# Patient Record
Sex: Female | Born: 1950
Health system: Southern US, Community
[De-identification: ages and names within clinical notes are randomized; demographics above are authoritative.]

## PROBLEM LIST (undated history)

## (undated) DIAGNOSIS — Z5189 Encounter for other specified aftercare: Secondary | ICD-10-CM

## (undated) DIAGNOSIS — I219 Acute myocardial infarction, unspecified: Secondary | ICD-10-CM

## (undated) DIAGNOSIS — Q2112 Patent foramen ovale: Secondary | ICD-10-CM

## (undated) DIAGNOSIS — E785 Hyperlipidemia, unspecified: Secondary | ICD-10-CM

## (undated) DIAGNOSIS — Z8679 Personal history of other diseases of the circulatory system: Secondary | ICD-10-CM

## (undated) DIAGNOSIS — K5792 Diverticulitis of intestine, part unspecified, without perforation or abscess without bleeding: Secondary | ICD-10-CM

## (undated) DIAGNOSIS — M1990 Unspecified osteoarthritis, unspecified site: Secondary | ICD-10-CM

## (undated) DIAGNOSIS — K219 Gastro-esophageal reflux disease without esophagitis: Secondary | ICD-10-CM

## (undated) DIAGNOSIS — I10 Essential (primary) hypertension: Secondary | ICD-10-CM

## (undated) DIAGNOSIS — I639 Cerebral infarction, unspecified: Secondary | ICD-10-CM

## (undated) HISTORY — PX: PARTIAL HYSTERECTOMY: SHX80

## (undated) HISTORY — DX: Hyperlipidemia, unspecified: E78.5

## (undated) HISTORY — PX: TOTAL ABDOMINAL HYSTERECTOMY: SHX209

## (undated) HISTORY — PX: ABDOMINAL HYSTERECTOMY: SHX81

## (undated) HISTORY — DX: Encounter for other specified aftercare: Z51.89

## (undated) HISTORY — DX: Essential (primary) hypertension: I10

## (undated) HISTORY — PX: VENTRAL HERNIA REPAIR: SHX424

## (undated) HISTORY — PX: COLONOSCOPY: SHX174

## (undated) HISTORY — DX: Gastro-esophageal reflux disease without esophagitis: K21.9

## (undated) HISTORY — DX: Unspecified osteoarthritis, unspecified site: M19.90

## (undated) HISTORY — DX: Acute myocardial infarction, unspecified: I21.9

## (undated) HISTORY — DX: Cerebral infarction, unspecified: I63.9

---

## 2000-09-23 ENCOUNTER — Other Ambulatory Visit: Admission: RE | Admit: 2000-09-23 | Discharge: 2000-09-23 | Payer: Self-pay | Admitting: Family Medicine

## 2000-10-01 ENCOUNTER — Encounter: Admission: RE | Admit: 2000-10-01 | Discharge: 2000-10-28 | Payer: Self-pay | Admitting: Family Medicine

## 2001-12-27 ENCOUNTER — Encounter: Admission: RE | Admit: 2001-12-27 | Discharge: 2002-03-27 | Payer: Self-pay | Admitting: Family Medicine

## 2003-05-21 ENCOUNTER — Encounter: Payer: Self-pay | Admitting: Emergency Medicine

## 2003-05-21 ENCOUNTER — Emergency Department (HOSPITAL_COMMUNITY): Admission: EM | Admit: 2003-05-21 | Discharge: 2003-05-21 | Payer: Self-pay | Admitting: Emergency Medicine

## 2004-08-20 ENCOUNTER — Ambulatory Visit: Payer: Self-pay | Admitting: Family Medicine

## 2004-09-17 ENCOUNTER — Ambulatory Visit: Payer: Self-pay | Admitting: Family Medicine

## 2004-12-09 ENCOUNTER — Ambulatory Visit: Payer: Self-pay | Admitting: Family Medicine

## 2005-02-18 ENCOUNTER — Ambulatory Visit: Payer: Self-pay | Admitting: Family Medicine

## 2005-03-02 ENCOUNTER — Ambulatory Visit: Payer: Self-pay | Admitting: Family Medicine

## 2005-03-11 ENCOUNTER — Ambulatory Visit: Payer: Self-pay | Admitting: Family Medicine

## 2005-03-25 ENCOUNTER — Ambulatory Visit: Payer: Self-pay | Admitting: Family Medicine

## 2005-05-25 ENCOUNTER — Ambulatory Visit: Payer: Self-pay | Admitting: Family Medicine

## 2005-05-26 ENCOUNTER — Ambulatory Visit: Payer: Self-pay | Admitting: Family Medicine

## 2005-09-24 ENCOUNTER — Ambulatory Visit: Payer: Self-pay | Admitting: Family Medicine

## 2006-06-23 ENCOUNTER — Ambulatory Visit: Payer: Self-pay | Admitting: Family Medicine

## 2006-08-23 ENCOUNTER — Ambulatory Visit: Payer: Self-pay | Admitting: Family Medicine

## 2006-09-20 ENCOUNTER — Ambulatory Visit: Payer: Self-pay | Admitting: Family Medicine

## 2006-09-24 ENCOUNTER — Ambulatory Visit: Payer: Self-pay | Admitting: Family Medicine

## 2006-12-24 ENCOUNTER — Other Ambulatory Visit: Admission: RE | Admit: 2006-12-24 | Discharge: 2006-12-24 | Payer: Self-pay | Admitting: Family Medicine

## 2006-12-24 ENCOUNTER — Ambulatory Visit: Payer: Self-pay | Admitting: Family Medicine

## 2006-12-24 ENCOUNTER — Encounter: Payer: Self-pay | Admitting: Family Medicine

## 2006-12-24 LAB — CONVERTED CEMR LAB
Albumin: 4.2 g/dL (ref 3.5–5.2)
Alkaline Phosphatase: 87 units/L (ref 39–117)
BUN: 9 mg/dL (ref 6–23)
Basophils Absolute: 0 10*3/uL (ref 0.0–0.1)
Calcium: 9.6 mg/dL (ref 8.4–10.5)
Chloride: 108 meq/L (ref 96–112)
Eosinophils Absolute: 0 10*3/uL (ref 0.0–0.6)
Eosinophils Relative: 0.1 % (ref 0.0–5.0)
Glucose, Bld: 127 mg/dL — ABNORMAL HIGH (ref 70–99)
Hemoglobin: 13.7 g/dL (ref 12.0–15.0)
MCHC: 34 g/dL (ref 30.0–36.0)
Microalb Creat Ratio: 14.1 mg/g (ref 0.0–30.0)
Microalb, Ur: 1.5 mg/dL (ref 0.0–1.9)
Monocytes Relative: 6.1 % (ref 3.0–11.0)
Neutro Abs: 3.2 10*3/uL (ref 1.4–7.7)
Platelets: 170 10*3/uL (ref 150–400)
RBC: 4.46 M/uL (ref 3.87–5.11)
RDW: 13.4 % (ref 11.5–14.6)
TSH: 3.36 microintl units/mL (ref 0.35–5.50)
Total Bilirubin: 0.7 mg/dL (ref 0.3–1.2)

## 2006-12-28 ENCOUNTER — Encounter: Payer: Self-pay | Admitting: Family Medicine

## 2006-12-29 LAB — CONVERTED CEMR LAB: Pap Smear: NORMAL

## 2007-01-06 ENCOUNTER — Ambulatory Visit: Payer: Self-pay | Admitting: Family Medicine

## 2007-02-17 DIAGNOSIS — E782 Mixed hyperlipidemia: Secondary | ICD-10-CM | POA: Insufficient documentation

## 2007-02-17 DIAGNOSIS — M109 Gout, unspecified: Secondary | ICD-10-CM | POA: Insufficient documentation

## 2007-02-17 DIAGNOSIS — E1169 Type 2 diabetes mellitus with other specified complication: Secondary | ICD-10-CM | POA: Insufficient documentation

## 2007-02-17 DIAGNOSIS — E119 Type 2 diabetes mellitus without complications: Secondary | ICD-10-CM | POA: Insufficient documentation

## 2007-02-17 DIAGNOSIS — Z8679 Personal history of other diseases of the circulatory system: Secondary | ICD-10-CM | POA: Insufficient documentation

## 2007-02-17 DIAGNOSIS — I152 Hypertension secondary to endocrine disorders: Secondary | ICD-10-CM | POA: Insufficient documentation

## 2007-02-17 DIAGNOSIS — I10 Essential (primary) hypertension: Secondary | ICD-10-CM | POA: Insufficient documentation

## 2007-02-17 DIAGNOSIS — E1159 Type 2 diabetes mellitus with other circulatory complications: Secondary | ICD-10-CM | POA: Insufficient documentation

## 2008-01-02 ENCOUNTER — Telehealth (INDEPENDENT_AMBULATORY_CARE_PROVIDER_SITE_OTHER): Payer: Self-pay | Admitting: *Deleted

## 2008-01-05 ENCOUNTER — Telehealth (INDEPENDENT_AMBULATORY_CARE_PROVIDER_SITE_OTHER): Payer: Self-pay | Admitting: *Deleted

## 2008-02-22 ENCOUNTER — Telehealth (INDEPENDENT_AMBULATORY_CARE_PROVIDER_SITE_OTHER): Payer: Self-pay | Admitting: *Deleted

## 2008-02-22 ENCOUNTER — Ambulatory Visit: Payer: Self-pay | Admitting: Family Medicine

## 2008-02-24 ENCOUNTER — Encounter (INDEPENDENT_AMBULATORY_CARE_PROVIDER_SITE_OTHER): Payer: Self-pay | Admitting: *Deleted

## 2008-02-24 LAB — CONVERTED CEMR LAB
Albumin: 4.1 g/dL (ref 3.5–5.2)
Basophils Relative: 0.6 % (ref 0.0–1.0)
CO2: 30 meq/L (ref 19–32)
Calcium: 9.6 mg/dL (ref 8.4–10.5)
Creatinine, Ser: 0.8 mg/dL (ref 0.4–1.2)
GFR calc Af Amer: 96 mL/min
GFR calc non Af Amer: 79 mL/min
HCT: 38 % (ref 36.0–46.0)
HDL: 39.3 mg/dL (ref >39.0)
Lymphocytes Relative: 31.9 % (ref 12.0–46.0)
MCV: 92.1 fL (ref 78.0–100.0)
Monocytes Absolute: 0.2 10*3/uL (ref 0.1–1.0)
Neutro Abs: 3.2 10*3/uL (ref 1.4–7.7)
Potassium: 4 meq/L (ref 3.5–5.1)
RBC: 4.13 M/uL (ref 3.87–5.11)
RDW: 14.2 % (ref 11.5–14.6)
TSH: 2.95 microintl units/mL (ref 0.35–5.50)
Total Protein: 7 g/dL (ref 6.0–8.3)
Triglycerides: 145 mg/dL (ref 0–149)
VLDL: 29 mg/dL (ref 0–40)
WBC: 5 10*3/uL (ref 4.5–10.5)

## 2008-04-02 ENCOUNTER — Encounter: Payer: Self-pay | Admitting: Family Medicine

## 2008-07-15 ENCOUNTER — Encounter: Payer: Self-pay | Admitting: Family Medicine

## 2008-07-16 ENCOUNTER — Ambulatory Visit: Payer: Self-pay | Admitting: Family Medicine

## 2008-07-17 ENCOUNTER — Encounter: Payer: Self-pay | Admitting: Family Medicine

## 2008-07-18 ENCOUNTER — Telehealth (INDEPENDENT_AMBULATORY_CARE_PROVIDER_SITE_OTHER): Payer: Self-pay | Admitting: *Deleted

## 2008-07-19 ENCOUNTER — Ambulatory Visit: Payer: Self-pay | Admitting: Family Medicine

## 2008-07-20 ENCOUNTER — Encounter: Payer: Self-pay | Admitting: Family Medicine

## 2008-07-21 LAB — CONVERTED CEMR LAB
ALT: 24 units/L (ref 0–35)
AST: 20 units/L (ref 0–37)
BUN: 18 mg/dL (ref 6–23)
Basophils Absolute: 0 10*3/uL (ref 0.0–0.1)
Basophils Relative: 0.3 % (ref 0.0–3.0)
Cholesterol: 168 mg/dL (ref 0–200)
Eosinophils Absolute: 0 10*3/uL (ref 0.0–0.7)
Eosinophils Relative: 0.1 % (ref 0.0–5.0)
GFR calc non Af Amer: 69 mL/min
HCT: 41.1 % (ref 36.0–46.0)
Hemoglobin: 13.9 g/dL (ref 12.0–15.0)
LDL Cholesterol: 99 mg/dL (ref 0–99)
Lymphocytes Relative: 36 % (ref 12.0–46.0)
Monocytes Absolute: 0.3 10*3/uL (ref 0.1–1.0)
Neutro Abs: 3.1 10*3/uL (ref 1.4–7.7)
Neutrophils Relative %: 57.3 % (ref 43.0–77.0)
Platelets: 157 10*3/uL (ref 150–400)
Potassium: 3.4 meq/L — ABNORMAL LOW (ref 3.5–5.1)
RBC: 4.42 M/uL (ref 3.87–5.11)
Total Bilirubin: 0.8 mg/dL (ref 0.3–1.2)
Uric Acid, Serum: 4.3 mg/dL (ref 2.4–7.0)

## 2008-07-24 ENCOUNTER — Ambulatory Visit: Payer: Self-pay | Admitting: Family Medicine

## 2008-07-24 ENCOUNTER — Telehealth (INDEPENDENT_AMBULATORY_CARE_PROVIDER_SITE_OTHER): Payer: Self-pay | Admitting: *Deleted

## 2008-07-24 ENCOUNTER — Encounter (INDEPENDENT_AMBULATORY_CARE_PROVIDER_SITE_OTHER): Payer: Self-pay | Admitting: *Deleted

## 2008-07-24 LAB — CONVERTED CEMR LAB
OCCULT 1: NEGATIVE
OCCULT 3: NEGATIVE

## 2008-10-03 ENCOUNTER — Telehealth (INDEPENDENT_AMBULATORY_CARE_PROVIDER_SITE_OTHER): Payer: Self-pay | Admitting: *Deleted

## 2008-12-21 ENCOUNTER — Telehealth (INDEPENDENT_AMBULATORY_CARE_PROVIDER_SITE_OTHER): Payer: Self-pay | Admitting: *Deleted

## 2009-01-11 ENCOUNTER — Encounter: Payer: Self-pay | Admitting: Family Medicine

## 2009-04-01 ENCOUNTER — Telehealth (INDEPENDENT_AMBULATORY_CARE_PROVIDER_SITE_OTHER): Payer: Self-pay | Admitting: *Deleted

## 2009-05-02 ENCOUNTER — Telehealth (INDEPENDENT_AMBULATORY_CARE_PROVIDER_SITE_OTHER): Payer: Self-pay | Admitting: *Deleted

## 2009-07-03 ENCOUNTER — Ambulatory Visit: Payer: Self-pay | Admitting: Family Medicine

## 2009-07-16 ENCOUNTER — Encounter: Payer: Self-pay | Admitting: Family Medicine

## 2009-07-16 LAB — CONVERTED CEMR LAB
ALT: 26 units/L (ref 0–35)
AST: 25 units/L (ref 0–37)
Albumin: 4.2 g/dL (ref 3.5–5.2)
Alkaline Phosphatase: 76 units/L (ref 39–117)
Basophils Relative: 0.3 % (ref 0.0–3.0)
Bilirubin, Direct: 0 mg/dL (ref 0.0–0.3)
CO2: 28 meq/L (ref 19–32)
Calcium: 9.7 mg/dL (ref 8.4–10.5)
Chloride: 102 meq/L (ref 96–112)
Cholesterol: 169 mg/dL (ref 0–200)
Creatinine, Ser: 0.8 mg/dL (ref 0.4–1.2)
GFR calc non Af Amer: 78.56 mL/min (ref >60)
Glucose, Bld: 115 mg/dL — ABNORMAL HIGH (ref 70–99)
Hgb A1c MFr Bld: 5.7 % (ref 4.6–6.5)
MCV: 95 fL (ref 78.0–100.0)
Microalb Creat Ratio: 5.8 mg/g (ref 0.0–30.0)
Microalb, Ur: 0.2 mg/dL (ref 0.0–1.9)
Monocytes Absolute: 0.3 10*3/uL (ref 0.1–1.0)
Sodium: 141 meq/L (ref 135–145)
TSH: 3.56 microintl units/mL (ref 0.35–5.50)
Total CHOL/HDL Ratio: 4
Total Protein: 6.9 g/dL (ref 6.0–8.3)
Uric Acid, Serum: 4.2 mg/dL (ref 2.4–7.0)
VLDL: 33 mg/dL (ref 0.0–40.0)
WBC: 4.7 10*3/uL (ref 4.5–10.5)

## 2009-07-26 ENCOUNTER — Encounter: Payer: Self-pay | Admitting: Family Medicine

## 2009-11-18 ENCOUNTER — Telehealth (INDEPENDENT_AMBULATORY_CARE_PROVIDER_SITE_OTHER): Payer: Self-pay | Admitting: *Deleted

## 2009-11-18 ENCOUNTER — Ambulatory Visit: Payer: Self-pay | Admitting: Family Medicine

## 2009-11-28 ENCOUNTER — Encounter: Payer: Self-pay | Admitting: Family Medicine

## 2010-01-13 ENCOUNTER — Encounter: Payer: Self-pay | Admitting: Family Medicine

## 2010-03-20 ENCOUNTER — Encounter: Payer: Self-pay | Admitting: Family Medicine

## 2010-04-02 ENCOUNTER — Encounter (INDEPENDENT_AMBULATORY_CARE_PROVIDER_SITE_OTHER): Payer: Self-pay | Admitting: *Deleted

## 2010-04-11 ENCOUNTER — Ambulatory Visit: Payer: Self-pay | Admitting: Family Medicine

## 2010-04-14 ENCOUNTER — Encounter: Payer: Self-pay | Admitting: Family Medicine

## 2010-07-23 ENCOUNTER — Encounter: Payer: Self-pay | Admitting: Family Medicine

## 2010-08-05 ENCOUNTER — Ambulatory Visit: Payer: Self-pay | Admitting: Family Medicine

## 2010-08-06 LAB — CONVERTED CEMR LAB
BUN: 18 mg/dL (ref 6–23)
Calcium: 9.8 mg/dL (ref 8.4–10.5)
Creatinine, Ser: 0.9 mg/dL (ref 0.4–1.2)
GFR calc non Af Amer: 72 mL/min (ref >60)
Glucose, Bld: 117 mg/dL — ABNORMAL HIGH (ref 70–99)
Uric Acid, Serum: 3.9 mg/dL (ref 2.4–7.0)

## 2010-08-22 ENCOUNTER — Telehealth (INDEPENDENT_AMBULATORY_CARE_PROVIDER_SITE_OTHER): Payer: Self-pay | Admitting: *Deleted

## 2010-08-22 ENCOUNTER — Encounter: Payer: Self-pay | Admitting: Family Medicine

## 2010-09-02 ENCOUNTER — Ambulatory Visit: Payer: Self-pay | Admitting: Family Medicine

## 2010-10-12 LAB — CONVERTED CEMR LAB
ALT: 22 units/L (ref 0–35)
AST: 23 units/L (ref 0–37)
Alkaline Phosphatase: 65 units/L (ref 39–117)
Alkaline Phosphatase: 70 units/L (ref 39–117)
Calcium: 9.2 mg/dL (ref 8.4–10.5)
Chloride: 107 meq/L (ref 96–112)
Creatinine,U: 66.2 mg/dL
GFR calc non Af Amer: 68.49 mL/min (ref >60)
Microalb Creat Ratio: 13.6 mg/g (ref 0.0–30.0)
Total Bilirubin: 0.3 mg/dL (ref 0.3–1.2)
Total Bilirubin: 0.7 mg/dL (ref 0.3–1.2)
Total Protein: 6.6 g/dL (ref 6.0–8.3)

## 2010-10-16 NOTE — Medication Information (Signed)
Summary: Nonadherence with Simcor/[employer] [place]  Nonadherence with [employer] [place]   Imported By: Edmonia James 12/24/2009 11:06:46  _____________________________________________________________________  External Attachment:    Type:   Image     Comment:   External Document

## 2010-10-16 NOTE — Letter (Signed)
Summary: [place]   Imported By: Edmonia James 04/21/2010 11:40:55  _____________________________________________________________________  External Attachment:    Type:   Image     Comment:   External Document

## 2010-10-16 NOTE — Medication Information (Signed)
Summary: Nonadherence with [employer]cor/[employer]BS  Nonadherence with [employer]   Imported By: Edmonia James 02/19/2010 11:03:37  _____________________________________________________________________  External Attachment:    Type:   Image     Comment:   External Document

## 2010-10-16 NOTE — Assessment & Plan Note (Signed)
Summary: CPX,BCBS INS/RH......   Vital Signs:  Patient profile:   60 year old female Height:      65.5 inches Weight:      310 pounds BMI:     50.99 Temp:     97.9 degrees F oral Pulse rate:   63 / minute BP sitting:   120 / 76  (left arm)  Vitals Entered By: Jeremy Johann CMA (August 05, 2010 1:04 PM) CC: refill med, flu shot   History of Present Illness:  Type 1 diabetes mellitus follow-up      This is a 60 year old woman who presents with Type 2 diabetes mellitus follow-up.  The patient denies polyuria, polydipsia, blurred vision, self managed hypoglycemia, hypoglycemia requiring help, weight loss, weight gain, and numbness of extremities.  The patient denies the following symptoms: neuropathic pain, chest pain, vomiting, orthostatic symptoms, poor wound healing, intermittent claudication, vision loss, and foot ulcer.  Since the last visit the patient reports compliance with medications, not exercising regularly, and monitoring blood glucose.  Since the last visit, the patient reports having had eye care by an ophthalmologist and no foot care.    Hyperlipidemia follow-up      The patient also presents for Hyperlipidemia follow-up.  The patient denies muscle aches, GI upset, abdominal pain, flushing, itching, constipation, diarrhea, and fatigue.  The patient denies the following symptoms: chest pain/pressure, exercise intolerance, dypsnea, palpitations, syncope, and pedal edema.  Compliance with medications (by patient report) has been near 100%.  Dietary compliance has been fair.  The patient reports exercising occasionally.  Adjunctive measures currently used by the patient include ASA and niacin.    Problems Prior to Update: 1)  Preventive Health Care  (ICD-V70.0) 2)  Family History Diabetes 1st Degree Relative  (ICD-V18.0) 3)  Obesity  (ICD-278.00) 4)  Rheumatic Fever, Hx of  (ICD-V12.59) 5)  Dm  (ICD-250.00) 6)  Hypertension  (ICD-401.9) 7)  Hyperlipidemia  (ICD-272.4) 8)   Gout  (ICD-274.9)  Medications Prior to Update: 1)  Actoplus Met 15-500 Mg Tabs (Pioglitazone Hcl-Metformin Hcl) .... Take One Tablet Twice Daliy. 2)  Diovan Hct 160-25 Mg Tabs (Valsartan-Hydrochlorothiazide) .... Take One Tablet Daily 3)  Allopurinol 300 Mg Tabs (Allopurinol) .... Take One Tablet Twice Daily 4)  Precision Xtra Blood Glucose   Strp (Glucose Blood) .... Use As Directed 5)  Nexium 40 Mg  Cpdr (Esomeprazole Magnesium) .Marland Kitchen.. 1 By Mouth Once Daily 6)  Klor-Con M20 20 Meq Cr-Tabs (Potassium Chloride Crys Cr) .Marland Kitchen.. 1 By Mouth Two Times A Day 7)  Accu-Chek Instant Plus Test  Strp (Glucose Blood) .... As Directed. 8)  Simcor 1000-40 Mg Xr24h-Tab (Niacin-Simvastatin) .Marland Kitchen.. 1 By Mouth At Bedtime  Current Medications (verified): 1)  Actoplus Met 15-500 Mg Tabs (Pioglitazone Hcl-Metformin Hcl) .... Take One Tablet Twice Daliy. 2)  Diovan Hct 160-25 Mg Tabs (Valsartan-Hydrochlorothiazide) .... Take One Tablet Daily 3)  Allopurinol 300 Mg Tabs (Allopurinol) .... Take One Tablet Twice Daily 4)  Nexium 40 Mg  Cpdr (Esomeprazole Magnesium) .Marland Kitchen.. 1 By Mouth Once Daily 5)  Accu-Chek Instant Plus Test  Strp (Glucose Blood) .... As Directed. 6)  Simcor 1000-40 Mg Xr24h-Tab (Niacin-Simvastatin) .Marland Kitchen.. 1 By Mouth At Bedtime 7)  Aspirin Ec 81 Mg Tbec (Aspirin) .... Take 1 Tab Once Daily 8)  Fish Oil 1000 Mg Caps (Omega-3 Fatty Acids) .... Take 2 Tab Once Daily 9)  K-Vescent 20 Meq Pack (Potassium Chloride) .Marland Kitchen.. 1 By Mouth Once Daily  Allergies (verified): No Known Drug Allergies  Past History:  Family History: Last updated: 07/16/2008 Family History Diabetes 1st degree relative Family History Hypertension Family History CVA Family History High cholesterol  Social History: Last updated: 07/16/2008 Married Alcohol use-no Never Smoked Drug use-no Regular exercise-no  Risk Factors: Caffeine Use: 0 (07/03/2009) Exercise: no (07/03/2009)  Risk Factors: Smoking Status: never  (07/03/2009)  Past medical, surgical, family and social histories (including risk factors) reviewed for relevance to current acute and chronic problems.  Past Medical History: Reviewed history from 07/16/2008 and no changes required. Gout Hyperlipidemia Hypertension Current Problems:  FAMILY HISTORY DIABETES 1ST DEGREE RELATIVE (ICD-V18.0) OBESITY (ICD-278.00) RHEUMATIC FEVER, HX OF (ICD-V12.59) DM (ICD-250.00) HYPERTENSION (ICD-401.9) HYPERLIPIDEMIA (ICD-272.4) GOUT (ICD-274.9)  Past Surgical History: Reviewed history from 07/16/2008 and no changes required. child birth ventral hernia X2 Hysterectomy--  TAH-- secondary to bleeding  Family History: Reviewed history from 07/16/2008 and no changes required. Family History Diabetes 1st degree relative Family History Hypertension Family History CVA Family History High cholesterol  Social History: Reviewed history from 07/16/2008 and no changes required. Married Alcohol use-no Never Smoked Drug use-no Regular exercise-no  Review of Systems      See HPI  Physical Exam  General:  Well-developed,well-nourished,in no acute distress; alert,appropriate and cooperative throughout examination Neck:  No deformities, masses, or tenderness noted. Lungs:  Normal respiratory effort, chest expands symmetrically. Lungs are clear to auscultation, no crackles or wheezes. Heart:  normal rate and no murmur.   Extremities:  No clubbing, cyanosis, edema, or deformity noted with normal full range of motion of all joints.   Skin:  Intact without suspicious lesions or rashes Psych:  Oriented X3 and normally interactive.    Diabetes Management Exam:    Foot Exam (with socks and/or shoes not present):       Sensory-other: vi    Eye Exam:       Eye Exam done elsewhere          Date: 04/30/2010          Results: normal          Done by: [facility]    Impression & Recommendations:  Problem # 1:  PREVENTIVE HEALTH CARE  (ICD-V70.0)  check fasting lab ghm utd   Orders: EKG w/ Interpretation (93000)  Problem # 2:  DM (ICD-250.00)  Her updated medication list for this problem includes:    Actoplus Met 15-500 Mg Tabs (Pioglitazone hcl-metformin hcl) .Marland Kitchen... Take one tablet twice daliy.    Diovan Hct 160-25 Mg Tabs (Valsartan-hydrochlorothiazide) .Marland Kitchen... Take one tablet daily    Aspirin Ec 81 Mg Tbec (Aspirin) .Marland Kitchen... Take 1 tab once daily  Orders: Venipuncture (16109) TLB-BMP (Basic Metabolic Panel-BMET) (80048-METABOL) TLB-Uric Acid, Blood (84550-URIC) T- * Misc. Laboratory test [id number]) Specimen Handling (09811)  Problem # 3:  HYPERTENSION (ICD-401.9)  Her updated medication list for this problem includes:    Diovan Hct 160-25 Mg Tabs (Valsartan-hydrochlorothiazide) .Marland Kitchen... Take one tablet daily  Orders: Venipuncture (91478) TLB-BMP (Basic Metabolic Panel-BMET) (80048-METABOL) TLB-Uric Acid, Blood (84550-URIC) T- * Misc. Laboratory test [id number]) Specimen Handling (13086)  Problem # 4:  HYPERLIPIDEMIA (ICD-272.4)  Her updated medication list for this problem includes:    Simcor 1000-40 Mg Xr24h-tab (Niacin-simvastatin) .Marland Kitchen... 1 by mouth at bedtime  Orders: Venipuncture (57846) TLB-BMP (Basic Metabolic Panel-BMET) (80048-METABOL) TLB-Uric Acid, Blood (84550-URIC) T- * Misc. Laboratory test [phone]) Specimen Handling (28413)  Problem # 5:  GOUT (ICD-274.9)  Her updated medication list for this problem includes:    Allopurinol 300 Mg Tabs (Allopurinol) .Marland Kitchen... Take one  tablet twice daily  Orders: Venipuncture (36644) TLB-BMP (Basic Metabolic Panel-BMET) (80048-METABOL) TLB-Uric Acid, Blood (84550-URIC) T- * Misc. Laboratory test [phone]) Specimen Handling (25956)  Elevate extremity; warm compresses, symptomatic relief and medication as directed.   Complete Medication List: 1)  Actoplus Met 15-500 Mg Tabs (Pioglitazone hcl-metformin hcl) .... Take one tablet twice daliy. 2)  Diovan Hct 160-25  Mg Tabs (Valsartan-hydrochlorothiazide) .... Take one tablet daily 3)  Allopurinol 300 Mg Tabs (Allopurinol) .... Take one tablet twice daily 4)  Nexium 40 Mg Cpdr (Esomeprazole magnesium) .Marland Kitchen.. 1 by mouth once daily 5)  Accu-chek Instant Plus Test Strp (Glucose blood) .... As directed. 6)  Simcor 1000-40 Mg Xr24h-tab (Niacin-simvastatin) .Marland Kitchen.. 1 by mouth at bedtime 7)  Aspirin Ec 81 Mg Tbec (Aspirin) .... Take 1 tab once daily 8)  Fish Oil 1000 Mg Caps (Omega-3 fatty acids) .... Take 2 tab once daily 9)  K-vescent 20 Meq Pack (Potassium chloride) .Marland Kitchen.. 1 by mouth once daily  Other Orders: Admin 1st Vaccine (38756) Flu Vaccine 59yrs + (43329)  Patient Instructions: 1)  Please schedule a follow-up appointment in 1 month.  Prescriptions: DIOVAN HCT 160-25 MG TABS (VALSARTAN-HYDROCHLOROTHIAZIDE) TAKE ONE TABLET DAILY  #90 x 3   Entered and Authorized by:   Loreen Freud DO   Signed by:   Loreen Freud DO on 08/05/2010   Method used:   Print then Give to Patient   RxID:   [id number] SIMCOR 1000-40 MG XR24H-TAB (NIACIN-SIMVASTATIN) 1 by mouth at bedtime  #60 Tablet x 6   Entered and Authorized by:   Loreen Freud DO   Signed by:   Loreen Freud DO on 08/05/2010   Method used:   Electronically to        [id number] (retail)       [address]       [place], [place]  [address]       Ph: [phone] or [phone]       Fax: [phone]   RxID:   [id number] ALLOPURINOL 300 MG TABS (ALLOPURINOL) take one tablet twice daily  #60 Tablet x 0   Entered and Authorized by:   Loreen Freud DO   Signed by:   Loreen Freud DO on 08/05/2010   Method used:   Electronically to        [id number]* (retail)       [address]       [place], [place]  [address]       Ph: [phone] or [phone]       Fax: [phone]   RxID:   [id number] DIOVAN HCT 160-25 MG TABS (VALSARTAN-HYDROCHLOROTHIAZIDE) TAKE ONE TABLET  DAILY  #30 Tablet x 3   Entered and Authorized by:   Loreen Freud DO   Signed by:   Loreen Freud DO on 08/05/2010   Method used:   Electronically to        [id number]* (retail)       [address]       [place], [place]  [address]       Ph: [phone] or [phone]       Fax: [phone]   RxID:   [id number] ACTOPLUS MET 15-500 MG TABS (PIOGLITAZONE HCL-METFORMIN HCL) take one tablet twice daliy.  #60 Tablet x 5  Entered and Authorized by:   Loreen Freud DO   Signed by:   Loreen Freud DO on 08/05/2010   Method used:   Electronically to        [id number]* (retail)       [address]       [place], [place]  [address]       Ph: [phone] or [phone]       Fax: [phone]   RxID:   [id number] K-VESCENT 20 MEQ PACK (POTASSIUM CHLORIDE) 1 by mouth once daily  #30 x 2   Entered and Authorized by:   Loreen Freud DO   Signed by:   Loreen Freud DO on 08/05/2010   Method used:   Electronically to        [id number]* (retail)       [address]       [place], [place]  [address]       Ph: [phone] or [phone]       Fax: [phone]   RxID:   [id number]  Flu Vaccine Consent Questions     Do you have a history of severe allergic reactions to this vaccine? no    Any prior history of allergic reactions to egg and/or gelatin? no    Do you have a sensitivity to the preservative Thimersol? no    Do you have a past history of Guillan-Barre Syndrome? no    Do you currently have an acute febrile illness? no    Have you ever had a severe reaction to latex? no    Vaccine information given and explained to patient? yes    Are you currently pregnant? no    Lot Number:[id number]   Exp Date:03/14/2011   Site Given  Left Deltoid IM   Orders Added: 1)  Admin 1st Vaccine [90471] 2)  Flu Vaccine 50yrs + [90658] 3)  Venipuncture [36415] 4)  TLB-BMP  (Basic Metabolic Panel-BMET) [80048-METABOL] 5)  TLB-Uric Acid, Blood [84550-URIC] 6)  T- * Misc. Laboratory test [99999] 7)  Specimen Handling [99000] 8)  Est. Patient 40-64 years [99396] 9)  EKG w/ Interpretation [93000]  .lbflu

## 2010-10-16 NOTE — Progress Notes (Signed)
  Phone Note Call from Patient   Summary of Call: Needs new strips for her Accu-check machines.     New/Updated Medications: ACCU-CHEK INSTANT PLUS TEST  STRP (GLUCOSE BLOOD) as directed. Prescriptions: ACCU-CHEK INSTANT PLUS TEST  STRP (GLUCOSE BLOOD) as directed.  #60 x 11   Entered by:   Army Fossa CMA   Authorized by:   Loreen Freud DO   Signed by:   Army Fossa CMA on 11/18/2009   Method used:   Electronically to        [id number] (retail)       [address]       [place], [place]  [address]       Ph: [phone] or [phone]       Fax: [phone]   RxID:   [id number]

## 2010-10-16 NOTE — Progress Notes (Signed)
Summary: Prior Auth no override required [employer]  Phone Note Refill Request Call back at [phone] Message from:  Pharmacy on August 22, 2010 7:43 AM  Refills Requested: Medication #1:  NEXIUM 40 MG  CPDR 1 by mouth once daily   Dosage confirmed as above?Dosage Confirmed Prior Auth from [employer] on [address]   Next Appointment Scheduled: 12.20.11 Initial call taken by: Harold Barban,  August 22, 2010 7:44 AM  Follow-up for Phone Call        Prior auth faxed awaiting response..........Marland KitchenFelecia Deloach CMA  August 22, 2010 10:20 AM   Per insurance No override required, pharmacy faxed....Marland KitchenMarland KitchenFelecia Deloach CMA  August 26, 2010 9:01 AM

## 2010-10-16 NOTE — Medication Information (Signed)
Summary: No Override Needed for Nexium/BCBS  No Override Needed for Nexium/BCBS   Imported By: Lanelle Bal 09/04/2010 10:38:22  _____________________________________________________________________  External Attachment:    Type:   Image     Comment:   External Document

## 2010-10-16 NOTE — Letter (Signed)
Summary: Primary Care Appointment Letter  [facility]   [place], [place] [address]   Phone: [phone]  Fax: [phone]    07/23/2010 MRN: [id number]  [facility] [address] [place], [place]  [address]  Dear Ms. Sano,   Your Primary Care Physician Garnet Koyanagi DO has indicated that:    __x_____it is time to schedule an appointment.    _______you missed your appointment on______ and need to call and          reschedule.    _______you need to have lab work done.    _______you need to schedule an appointment discuss lab or test results.    _______you need to call to reschedule your appointment that is                       scheduled on _________.     Please call our office as soon as possible. Our phone number is [phone]. Please press option 1. Our office is open 8a-12noon and 1p-5p, Monday through Friday.     Thank you,    [place]

## 2010-10-16 NOTE — Letter (Signed)
Summary: Primary Care Appointment Letter  [facility]  [address] [place], [place] [address]   Phone: [phone]  Fax: [phone]    04/02/2010 MRN: [id number]  [facility] [address] [place], [place]  [address]  Dear Ms. Leibensperger,   Your Primary Care Physician Loreen Freud DO has indicated that:    _______it is time to schedule an appointment.    _______you missed your appointment on______ and need to call and          reschedule.    __x_____you need to have lab work done.    _______you need to schedule an appointment discuss lab or test results.    _______you need to call to reschedule your appointment that is                       scheduled on _________.     Please call our office as soon as possible. Our phone number is [phone]. Please press option 1. Our office is open 8a-12noon and 1p-5p, Monday through Friday.     Thank you,    [facility] Primary [facility] Scheduler

## 2010-10-16 NOTE — Assessment & Plan Note (Signed)
Summary: review [facility]bs   Vital Signs:  Patient profile:   60 year old female Weight:      310 pounds Pulse rate:   63 / minute Pulse rhythm:   regular BP sitting:   118 / 60  (left arm) Cuff size:   large  Vitals Entered By: Almeta Monas CMA Duncan Dull) (September 02, 2010 1:52 PM) CC: Review [facility]   History of Present Illness: Pt here to review [facility] only.  Current Medications (verified): 1)  Actoplus Met 15-500 Mg Tabs (Pioglitazone Hcl-Metformin Hcl) .... Take One Tablet Twice Daliy. 2)  Diovan Hct 160-25 Mg Tabs (Valsartan-Hydrochlorothiazide) .... Take One Tablet Daily 3)  Allopurinol 300 Mg Tabs (Allopurinol) .... Take One Tablet Twice Daily 4)  Nexium 40 Mg  Cpdr (Esomeprazole Magnesium) .Marland Kitchen.. 1 By Mouth Once Daily 5)  Accu-Chek Instant Plus Test  Strp (Glucose Blood) .... As Directed. 6)  Simcor 1000-40 Mg Xr24h-Tab (Niacin-Simvastatin) .Marland Kitchen.. 1 By Mouth At Bedtime 7)  Aspirin Ec 81 Mg Tbec (Aspirin) .... Take 1 Tab Once Daily 8)  Fish Oil 1000 Mg Caps (Omega-3 Fatty Acids) .... Take 2 Tab Once Daily 9)  K-Vescent 20 Meq Pack (Potassium Chloride) .Marland Kitchen.. 1 By Mouth Once Daily  Allergies (verified): No Known Drug Allergies  Past History:  Past Medical History: Last updated: 07/16/2008 Gout Hyperlipidemia Hypertension Current Problems:  FAMILY HISTORY DIABETES 1ST DEGREE RELATIVE (ICD-V18.0) OBESITY (ICD-278.00) RHEUMATIC FEVER, HX OF (ICD-V12.59) DM (ICD-250.00) HYPERTENSION (ICD-401.9) HYPERLIPIDEMIA (ICD-272.4) GOUT (ICD-274.9)  Past Surgical History: Last updated: 07/16/2008 child birth ventral hernia X2 Hysterectomy--  TAH-- secondary to bleeding  Family History: Last updated: 07/16/2008 Family History Diabetes 1st degree relative Family History Hypertension Family History CVA Family History High cholesterol  Social History: Last updated: 07/16/2008 Married Alcohol use-no Never Smoked Drug use-no Regular  exercise-no  Risk Factors: Caffeine Use: 0 (07/03/2009) Exercise: no (07/03/2009)  Risk Factors: Smoking Status: never (07/03/2009)  Family History: Reviewed history from 07/16/2008 and no changes required. Family History Diabetes 1st degree relative Family History Hypertension Family History CVA Family History High cholesterol  Social History: Reviewed history from 07/16/2008 and no changes required. Married Alcohol use-no Never Smoked Drug use-no Regular exercise-no  Review of Systems      See HPI  Physical Exam  General:  Well-developed,well-nourished,in no acute distress; alert,appropriate and cooperative throughout examination Psych:  Oriented X3 and normally interactive.     Impression & Recommendations:  Problem # 1:  HYPERTENSION (ICD-401.9)  Her updated medication list for this problem includes:    Diovan Hct 160-25 Mg Tabs (Valsartan-hydrochlorothiazide) .Marland Kitchen... Take one tablet daily  BP today: 118/60 Prior BP: 120/76 (08/05/2010)  Labs Reviewed: K+: 4.6 (08/05/2010) Creat: : 0.9 (08/05/2010)   Chol: 169 (07/03/2009)   HDL: 41.50 (07/03/2009)   LDL: 95 (07/03/2009)   TG: 165.0 (07/03/2009)  Problem # 2:  DM (ICD-250.00)  Her updated medication list for this problem includes:    Actoplus Met 15-500 Mg Tabs (Pioglitazone hcl-metformin hcl) .Marland Kitchen... Take one tablet twice daliy.    Diovan Hct 160-25 Mg Tabs (Valsartan-hydrochlorothiazide) .Marland Kitchen... Take one tablet daily    Aspirin Ec 81 Mg Tbec (Aspirin) .Marland Kitchen... Take 1 tab once daily  Labs Reviewed: Creat: 0.9 (08/05/2010)     Last Eye Exam: normal (04/30/2010) Reviewed HgBA1c results: 5.8 (11/18/2009)  5.7 (07/03/2009)  Problem # 3:  HYPERLIPIDEMIA (ICD-272.4)  Her updated medication list for this problem includes:    Simcor 1000-40 Mg Xr24h-tab (Niacin-simvastatin) .Marland KitchenMarland KitchenMarland KitchenMarland Kitchen 1  by mouth at bedtime  Labs Reviewed: SGOT: 23 (04/11/2010)   SGPT: 26 (04/11/2010)   HDL:41.50 (07/03/2009), 40.0 (07/16/2008)   LDL:95 (07/03/2009), 99 (07/16/2008)  Chol:169 (07/03/2009), 168 (07/16/2008)  Trig:165.0 (07/03/2009), 145 (07/16/2008)  Complete Medication List: 1)  Actoplus Met 15-500 Mg Tabs (Pioglitazone hcl-metformin hcl) .... Take one tablet twice daliy. 2)  Diovan Hct 160-25 Mg Tabs (Valsartan-hydrochlorothiazide) .... Take one tablet daily 3)  Allopurinol 300 Mg Tabs (Allopurinol) .... Take one tablet twice daily 4)  Nexium 40 Mg Cpdr (Esomeprazole magnesium) .Marland Kitchen.. 1 by mouth once daily 5)  Accu-chek Instant Plus Test Strp (Glucose blood) .... As directed. 6)  Simcor 1000-40 Mg Xr24h-tab (Niacin-simvastatin) .Marland Kitchen.. 1 by mouth at bedtime 7)  Aspirin Ec 81 Mg Tbec (Aspirin) .... Take 1 tab once daily 8)  Fish Oil 1000 Mg Caps (Omega-3 fatty acids) .... Take 2 tab once daily 9)  K-vescent 20 Meq Pack (Potassium chloride) .Marland Kitchen.. 1 by mouth once daily  Patient Instructions: 1)  fasting labs 6 months---  [facility], bmp  250.00  272.4   Orders Added: 1)  Est. Patient Level III [36644]

## 2010-12-17 ENCOUNTER — Other Ambulatory Visit: Payer: Self-pay | Admitting: Family Medicine

## 2011-02-11 ENCOUNTER — Other Ambulatory Visit: Payer: Self-pay | Admitting: Family Medicine

## 2011-03-04 ENCOUNTER — Other Ambulatory Visit: Payer: Self-pay | Admitting: Family Medicine

## 2011-03-04 DIAGNOSIS — E119 Type 2 diabetes mellitus without complications: Secondary | ICD-10-CM

## 2011-03-05 ENCOUNTER — Other Ambulatory Visit (INDEPENDENT_AMBULATORY_CARE_PROVIDER_SITE_OTHER): Payer: BC Managed Care – PPO

## 2011-03-05 DIAGNOSIS — E119 Type 2 diabetes mellitus without complications: Secondary | ICD-10-CM

## 2011-03-05 LAB — BASIC METABOLIC PANEL
BUN: 14 mg/dL (ref 6–23)
CO2: 30 mEq/L (ref 19–32)
Chloride: 104 mEq/L (ref 96–112)
Potassium: 3.7 mEq/L (ref 3.5–5.1)

## 2011-03-05 NOTE — Progress Notes (Signed)
Labs only

## 2011-03-15 ENCOUNTER — Other Ambulatory Visit: Payer: Self-pay | Admitting: Family Medicine

## 2011-03-25 ENCOUNTER — Telehealth: Payer: Self-pay

## 2011-03-25 NOTE — Telephone Encounter (Signed)
[facility] results--  Comment from Dr.Lowne: Did patient stop Simcor? HDL decreased  And CRP increased recheck Lipid and Hep in 3 months 272.4     Discussed results with patient and she stated she did not stop the Simcor, she is still taking it as directed. I advised continue meds and watch diet and exercise and we will recheck in 3 mos, she voiced understanding and agreed.   Is there anything else that you suggest?   KP

## 2011-03-26 NOTE — Telephone Encounter (Signed)
No --just con't med--it can take the simcor a while to affect the HDL--- over a year sometimes----recheck 3 months----272.4  Lipid hep

## 2011-03-26 NOTE — Telephone Encounter (Signed)
Discussed with patient and she voiced understanding and agreed to follow up in 3 mos    KP

## 2011-03-30 ENCOUNTER — Encounter: Payer: Self-pay | Admitting: Family Medicine

## 2011-05-27 ENCOUNTER — Other Ambulatory Visit: Payer: Self-pay | Admitting: Family Medicine

## 2011-06-17 ENCOUNTER — Other Ambulatory Visit: Payer: Self-pay | Admitting: Family Medicine

## 2011-07-15 ENCOUNTER — Other Ambulatory Visit: Payer: Self-pay | Admitting: Family Medicine

## 2011-07-15 MED ORDER — ALLOPURINOL 300 MG PO TABS: 300.0000 mg | ORAL_TABLET | Freq: Two times a day (BID) | ORAL | Status: DC

## 2011-07-15 NOTE — Telephone Encounter (Signed)
Letter mailed to schedule a CPE with fasting labs     KP

## 2011-07-24 ENCOUNTER — Other Ambulatory Visit: Payer: Self-pay | Admitting: Family Medicine

## 2011-07-27 ENCOUNTER — Encounter: Payer: Self-pay | Admitting: Family Medicine

## 2011-07-28 ENCOUNTER — Encounter: Payer: Self-pay | Admitting: Family Medicine

## 2011-07-28 ENCOUNTER — Ambulatory Visit (INDEPENDENT_AMBULATORY_CARE_PROVIDER_SITE_OTHER): Payer: BC Managed Care – PPO | Admitting: Family Medicine

## 2011-07-28 VITALS — BP 135/70 | HR 88 | Temp 98.1°F | Ht 65.5 in | Wt 336.6 lb

## 2011-07-28 DIAGNOSIS — E785 Hyperlipidemia, unspecified: Secondary | ICD-10-CM

## 2011-07-28 DIAGNOSIS — I10 Essential (primary) hypertension: Secondary | ICD-10-CM

## 2011-07-28 DIAGNOSIS — M109 Gout, unspecified: Secondary | ICD-10-CM

## 2011-07-28 DIAGNOSIS — Z0000 Encounter for general adult medical examination without abnormal findings: Secondary | ICD-10-CM

## 2011-07-28 DIAGNOSIS — K219 Gastro-esophageal reflux disease without esophagitis: Secondary | ICD-10-CM

## 2011-07-28 DIAGNOSIS — E119 Type 2 diabetes mellitus without complications: Secondary | ICD-10-CM

## 2011-07-28 DIAGNOSIS — Z23 Encounter for immunization: Secondary | ICD-10-CM

## 2011-07-28 LAB — BASIC METABOLIC PANEL
CO2: 25 mEq/L (ref 19–32)
Calcium: 9.9 mg/dL (ref 8.4–10.5)
Chloride: 105 mEq/L (ref 96–112)
Creatinine, Ser: 1 mg/dL (ref 0.4–1.2)
Glucose, Bld: 116 mg/dL — ABNORMAL HIGH (ref 70–99)
Sodium: 144 mEq/L (ref 135–145)

## 2011-07-28 LAB — CBC WITH DIFFERENTIAL/PLATELET
Basophils Absolute: 0 10*3/uL (ref 0.0–0.1)
Basophils Relative: 0.5 % (ref 0.0–3.0)
Eosinophils Absolute: 0 10*3/uL (ref 0.0–0.7)
Hemoglobin: 12.8 g/dL (ref 12.0–15.0)
Lymphocytes Relative: 41.2 % (ref 12.0–46.0)
MCHC: 33.8 g/dL (ref 30.0–36.0)
MCV: 95.2 fl (ref 78.0–100.0)
Monocytes Absolute: 0.2 10*3/uL (ref 0.1–1.0)
Neutro Abs: 2.8 10*3/uL (ref 1.4–7.7)
RDW: 15.2 % — ABNORMAL HIGH (ref 11.5–14.6)

## 2011-07-28 LAB — MICROALBUMIN / CREATININE URINE RATIO: Creatinine,U: 38.2 mg/dL

## 2011-07-28 LAB — LIPID PANEL
Cholesterol: 140 mg/dL (ref 0–200)
LDL Cholesterol: 62 mg/dL (ref 0–99)
Total CHOL/HDL Ratio: 3
Triglycerides: 133 mg/dL (ref 0.0–149.0)
VLDL: 26.6 mg/dL (ref 0.0–40.0)

## 2011-07-28 LAB — HEPATIC FUNCTION PANEL
AST: 24 U/L (ref 0–37)
Bilirubin, Direct: 0.1 mg/dL (ref 0.0–0.3)
Total Bilirubin: 0.6 mg/dL (ref 0.3–1.2)

## 2011-07-28 LAB — POCT URINALYSIS DIPSTICK
Bilirubin, UA: NEGATIVE
Blood, UA: NEGATIVE
Glucose, UA: NEGATIVE
pH, UA: 7

## 2011-07-28 LAB — URIC ACID: Uric Acid, Serum: 3.4 mg/dL (ref 2.4–7.0)

## 2011-07-28 LAB — HEMOGLOBIN A1C: Hgb A1c MFr Bld: 5.8 % (ref 4.6–6.5)

## 2011-07-28 MED ORDER — VALSARTAN-HYDROCHLOROTHIAZIDE 160-25 MG PO TABS: 1.0000 | ORAL_TABLET | Freq: Every day | ORAL | Status: DC

## 2011-07-28 MED ORDER — SAXAGLIPTIN-METFORMIN ER 5-1000 MG PO TB24: ORAL_TABLET | ORAL | Status: DC

## 2011-07-28 MED ORDER — NIACIN-SIMVASTATIN ER 1000-40 MG PO TB24: ORAL_TABLET | ORAL | Status: DC

## 2011-07-28 MED ORDER — ALLOPURINOL 300 MG PO TABS: 300.0000 mg | ORAL_TABLET | Freq: Two times a day (BID) | ORAL | Status: DC

## 2011-07-28 MED ORDER — ESOMEPRAZOLE MAGNESIUM 40 MG PO CPDR: DELAYED_RELEASE_CAPSULE | ORAL | Status: DC

## 2011-07-28 NOTE — Progress Notes (Signed)
Subjective:     Rachel Black is a 60 y.o. female and is here for a comprehensive physical exam. The patient reports problems - knees are bone on bone.  No surgery because she needs to lose weight first.  .  History   Social History  . Marital Status: Married    Spouse Name: N/A    Number of Children: N/A  . Years of Education: N/A   Occupational History  . Not on file.   Social History Main Topics  . Smoking status: Never Smoker   . Smokeless tobacco: Not on file  . Alcohol Use: No  . Drug Use: No  . Sexually Active:    Other Topics Concern  . Not on file   Social History Narrative  . No narrative on file   Health Maintenance  Topic Date Due  . Pap Smear  06/13/1970  . Mammogram  06/13/2002  . Colonoscopy  06/13/2002  . Influenza Vaccine  06/15/2011  . Tetanus/tdap  10/07/2011    The following portions of the patient's history were reviewed and updated as appropriate: allergies, current medications, past family history, past medical history, past social history, past surgical history and problem list.  Review of Systems Review of Systems  Constitutional: Negative for activity change, appetite change and fatigue.  HENT: Negative for hearing loss, congestion, tinnitus and ear discharge.  dentist q61mEyes: Negative for visual disturbance (see optho q1y -- vision corrected to 20/20 with glasses).  Respiratory: Negative for cough, chest tightness and shortness of breath.   Cardiovascular: Negative for chest pain, palpitations and leg swelling.  Gastrointestinal: Negative for abdominal pain, diarrhea, constipation and abdominal distention.  Genitourinary: Negative for urgency, frequency, decreased urine volume and difficulty urinating.  Musculoskeletal: Negative for back pain,  gait problem. + knee pain Skin: Negative for color change, pallor and rash.  Neurological: Negative for dizziness, light-headedness, numbness and headaches.  Hematological: Negative for adenopathy.  Does not bruise/bleed easily.  Psychiatric/Behavioral: Negative for suicidal ideas, confusion, sleep disturbance, self-injury, dysphoric mood, decreased concentration and agitation.       Objective:    BP 135/70  Pulse 88  Temp(Src) 98.1 F (36.7 C) (Oral)  Ht 5' 5.5" (1.664 m)  Wt 336 lb 9.6 oz (152.681 kg)  BMI 55.16 kg/m2 General appearance: alert, cooperative, appears stated age, no distress and morbidly obese Head: Normocephalic, without obvious abnormality, atraumatic Eyes: conjunctivae/corneas clear. PERRL, EOM's intact. Fundi benign. Ears: normal TM's and external ear canals both ears Nose: Nares normal. Septum midline. Mucosa normal. No drainage or sinus tenderness. Throat: lips, mucosa, and tongue normal; teeth and gums normal Neck: no adenopathy, no carotid bruit, no JVD, supple, symmetrical, trachea midline and thyroid not enlarged, symmetric, no tenderness/mass/nodules Back: symmetric, no curvature. ROM normal. No CVA tenderness. Lungs: clear to auscultation bilaterally Breasts: normal appearance, no masses or tenderness Heart: regular rate and rhythm, S1, S2 normal, no murmur, click, rub or gallop Abdomen: soft, non-tender; bowel sounds normal; no masses,  no organomegaly Pelvic: not indicated; status post hysterectomy, negative ROS Extremities: extremities normal, atraumatic, no cyanosis or edema Pulses: 2+ and symmetric Skin: Skin color, texture, turgor normal. No rashes or lesions Lymph nodes: Cervical, supraclavicular, and axillary nodes normal. Neurologic: Alert and oriented X 3, normal strength and tone. Normal symmetric reflexes. Normal coordination and gait psych--  no depression/ anxiety    Assessment:    Healthy female exam.  DMII---stop actos plus met---start kombiglyza htn--stable, con't meds Hyperlipidemia-- con't meds OA-- per ortho  Plan:    ghm utd Check fasting labs See After Visit Summary for Counseling Recommendations

## 2011-07-28 NOTE — Patient Instructions (Signed)

## 2012-01-21 ENCOUNTER — Other Ambulatory Visit: Payer: Self-pay | Admitting: Family Medicine

## 2012-02-09 ENCOUNTER — Other Ambulatory Visit: Payer: Self-pay | Admitting: Family Medicine

## 2012-02-19 ENCOUNTER — Other Ambulatory Visit: Payer: Self-pay | Admitting: Family Medicine

## 2012-02-22 NOTE — Telephone Encounter (Signed)
Letter mailed to schedule labs.      KP

## 2012-02-29 ENCOUNTER — Ambulatory Visit (INDEPENDENT_AMBULATORY_CARE_PROVIDER_SITE_OTHER): Payer: BC Managed Care – PPO | Admitting: Family Medicine

## 2012-02-29 ENCOUNTER — Encounter: Payer: Self-pay | Admitting: Family Medicine

## 2012-02-29 VITALS — BP 130/72 | HR 104 | Temp 98.4°F | Wt 310.3 lb

## 2012-02-29 DIAGNOSIS — E785 Hyperlipidemia, unspecified: Secondary | ICD-10-CM

## 2012-02-29 DIAGNOSIS — M109 Gout, unspecified: Secondary | ICD-10-CM

## 2012-02-29 DIAGNOSIS — I10 Essential (primary) hypertension: Secondary | ICD-10-CM

## 2012-02-29 DIAGNOSIS — N390 Urinary tract infection, site not specified: Secondary | ICD-10-CM

## 2012-02-29 DIAGNOSIS — K219 Gastro-esophageal reflux disease without esophagitis: Secondary | ICD-10-CM

## 2012-02-29 DIAGNOSIS — E119 Type 2 diabetes mellitus without complications: Secondary | ICD-10-CM

## 2012-02-29 DIAGNOSIS — E876 Hypokalemia: Secondary | ICD-10-CM

## 2012-02-29 LAB — MICROALBUMIN / CREATININE URINE RATIO: Microalb, Ur: 1.2 mg/dL (ref 0.0–1.9)

## 2012-02-29 LAB — H. PYLORI ANTIBODY, IGG: H Pylori IgG: NEGATIVE

## 2012-02-29 LAB — POCT URINALYSIS DIPSTICK
Glucose, UA: NEGATIVE
Ketones, UA: NEGATIVE
Protein, UA: NEGATIVE
Spec Grav, UA: 1.015

## 2012-02-29 LAB — HEPATIC FUNCTION PANEL
Albumin: 4.1 g/dL (ref 3.5–5.2)
Alkaline Phosphatase: 73 U/L (ref 39–117)
Bilirubin, Direct: 0.1 mg/dL (ref 0.0–0.3)
Total Bilirubin: 0.7 mg/dL (ref 0.3–1.2)

## 2012-02-29 LAB — LIPID PANEL
HDL: 46.8 mg/dL (ref >39.00)
LDL Cholesterol: 45 mg/dL (ref 0–99)
VLDL: 28 mg/dL (ref 0.0–40.0)

## 2012-02-29 LAB — BASIC METABOLIC PANEL
BUN: 20 mg/dL (ref 6–23)
Calcium: 9.7 mg/dL (ref 8.4–10.5)
Creatinine, Ser: 0.9 mg/dL (ref 0.4–1.2)
GFR: 64.62 mL/min (ref >60.00)

## 2012-02-29 LAB — CBC WITH DIFFERENTIAL/PLATELET
Eosinophils Relative: 0 % (ref 0.0–5.0)
Lymphocytes Relative: 31.6 % (ref 12.0–46.0)
Monocytes Absolute: 0.4 10*3/uL (ref 0.1–1.0)
Monocytes Relative: 7.2 % (ref 3.0–12.0)
Neutrophils Relative %: 60.8 % (ref 43.0–77.0)
Platelets: 151 10*3/uL (ref 150.0–400.0)
RBC: 4.2 Mil/uL (ref 3.87–5.11)
WBC: 6.1 10*3/uL (ref 4.5–10.5)

## 2012-02-29 LAB — HEMOGLOBIN A1C: Hgb A1c MFr Bld: 5.8 % (ref 4.6–6.5)

## 2012-02-29 LAB — URIC ACID: Uric Acid, Serum: 3.1 mg/dL (ref 2.4–7.0)

## 2012-02-29 MED ORDER — ALLOPURINOL 300 MG PO TABS: 300.0000 mg | ORAL_TABLET | Freq: Two times a day (BID) | ORAL | Status: DC

## 2012-02-29 MED ORDER — GLUCOSE BLOOD VI STRP: ORAL_STRIP | Status: DC

## 2012-02-29 MED ORDER — DEXLANSOPRAZOLE 60 MG PO CPDR: 60.0000 mg | DELAYED_RELEASE_CAPSULE | Freq: Every day | ORAL | Status: DC

## 2012-02-29 MED ORDER — NIACIN-SIMVASTATIN ER 1000-40 MG PO TB24: ORAL_TABLET | ORAL | Status: DC

## 2012-02-29 MED ORDER — POTASSIUM CHLORIDE 20 MEQ PO PACK: 20.0000 meq | PACK | Freq: Every day | ORAL | Status: DC

## 2012-02-29 MED ORDER — VALSARTAN-HYDROCHLOROTHIAZIDE 160-25 MG PO TABS: 1.0000 | ORAL_TABLET | Freq: Every day | ORAL | Status: DC

## 2012-02-29 MED ORDER — SAXAGLIPTIN-METFORMIN ER 5-1000 MG PO TB24: ORAL_TABLET | ORAL | Status: DC

## 2012-02-29 NOTE — Assessment & Plan Note (Signed)
>>  ASSESSMENT AND PLAN FOR MIXED HYPERLIPIDEMIA WRITTEN ON 02/29/2012 11:48 AM BY LOWNE, YVONNE R, DO  Check labs con't meds

## 2012-02-29 NOTE — Assessment & Plan Note (Signed)
Stable con't meds 

## 2012-02-29 NOTE — Assessment & Plan Note (Signed)
Check labs con't meds 

## 2012-02-29 NOTE — Patient Instructions (Signed)
Diet for GERD or PUD Nutrition therapy can help ease the discomfort of gastroesophageal reflux disease (GERD) and peptic ulcer disease (PUD).  HOME CARE INSTRUCTIONS   Eat your meals slowly, in a relaxed setting.   Eat 5 to 6 small meals per day.   If a food causes distress, stop eating it for a period of time.  FOODS TO AVOID  Coffee, regular or decaffeinated.   Cola beverages, regular or low calorie.   Tea, regular or decaffeinated.   Pepper.   Cocoa.   High fat foods, including meats.   Butter, margarine, hydrogenated oil (trans fats).   Peppermint or spearmint (if you have GERD).   Fruits and vegetables if not tolerated.   Alcohol.   Nicotine (smoking or chewing). This is one of the most potent stimulants to acid production in the gastrointestinal tract.   Any food that seems to aggravate your condition.  If you have questions regarding your diet, ask your caregiver or a registered dietitian. TIPS  Lying flat may make symptoms worse. Keep the head of your bed raised 6 to 9 inches (15 to 23 cm) by using a foam wedge or blocks under the legs of the bed.   Do not lay down until 3 hours after eating a meal.   Daily physical activity may help reduce symptoms.  MAKE SURE YOU:   Understand these instructions.   Will watch your condition.   Will get help right away if you are not doing well or get worse.  Document Released: 08/31/2005 Document Revised: 08/20/2011 Document Reviewed: 07/17/2011 Exit[employer] Patient Information 2012 [employer]. 

## 2012-02-29 NOTE — Assessment & Plan Note (Signed)
>>  ASSESSMENT AND PLAN FOR ESSENTIAL HYPERTENSION WRITTEN ON 02/29/2012 11:49 AM BY LOWNE, YVONNE R, DO  Stable con't meds

## 2012-02-29 NOTE — Progress Notes (Signed)
  Subjective:    Patient ID: Rachel Black, female    DOB: February 22, 1951, 61 y.o.   MRN: [id number]  HPI HYPERTENSION Disease Monitoring Blood pressure range-not checking Chest pain- no      Dyspnea- no Medications Compliance- good Lightheadedness- no   Edema- some   DIABETES Disease Monitoring Blood Sugar ranges-120-125 Polyuria- no New Visual problems- no Medications Compliance- good Hypoglycemic symptoms- no   HYPERLIPIDEMIA Disease Monitoring See symptoms for Hypertension Medications Compliance- good RUQ pain- no  Muscle aches- no  ROS See HPI above   PMH Smoking Status noted    Review of Systems As above    Objective:   Physical Exam  Constitutional: She is oriented to person, place, and time. She appears well-developed and well-nourished.  Cardiovascular: Normal rate and regular rhythm.   No murmur heard. Pulmonary/Chest: Effort normal and breath sounds normal. No respiratory distress. She has no wheezes. She has no rales.  Musculoskeletal: She exhibits edema. She exhibits no tenderness.       mononucleosis  Neurological: She is alert and oriented to person, place, and time.  Psychiatric: She has a normal mood and affect. Her behavior is normal. Judgment and thought content normal.          Assessment & Plan:

## 2012-03-02 LAB — URINE CULTURE: Colony Count: 75000

## 2012-03-10 ENCOUNTER — Ambulatory Visit (INDEPENDENT_AMBULATORY_CARE_PROVIDER_SITE_OTHER): Payer: BC Managed Care – PPO | Admitting: Family Medicine

## 2012-03-10 ENCOUNTER — Encounter: Payer: Self-pay | Admitting: Family Medicine

## 2012-03-10 ENCOUNTER — Telehealth: Payer: Self-pay | Admitting: Family Medicine

## 2012-03-10 VITALS — BP 134/72 | HR 107 | Temp 98.5°F | Wt 302.0 lb

## 2012-03-10 DIAGNOSIS — R232 Flushing: Secondary | ICD-10-CM | POA: Insufficient documentation

## 2012-03-10 DIAGNOSIS — R209 Unspecified disturbances of skin sensation: Secondary | ICD-10-CM

## 2012-03-10 DIAGNOSIS — R202 Paresthesia of skin: Secondary | ICD-10-CM

## 2012-03-10 NOTE — Telephone Encounter (Signed)
FYI

## 2012-03-10 NOTE — Telephone Encounter (Signed)
Caller: Rachel Black/Patient; PCP: Rosalita Chessman.; CB#: [phone]; Call regarding Woke Feeling Flushed/Hot and Skin Was Burning/Prickling; no rash; Onset 03/10/12 X 10 min.  Afebrile.  FBS 118.  Felt nausea after sensation passed.  Began Dexilant 03/01/12. Advised to see MD within 24 hrs for symptoms began after starting RX per Numbness or Tingling Guideline.  Appt scheduled for 1330 03/10/12 with Dr Etter Sjogren.

## 2012-03-10 NOTE — Patient Instructions (Addendum)
If symptoms return -- call If worsen go to ER We may need to consider changing the simcor

## 2012-03-10 NOTE — Progress Notes (Signed)
  Subjective:    Patient ID: Rachel Black, female    DOB: July 01, 1951, 61 y.o.   MRN: [id number]  HPI Pt woke up with tingling and burning all over.  It lasted just 10-20 minutes and went away.   She took simcor the same time last night as usual.   Symptoms are now gone and she feels fine.      Review of Systems As above    Objective:   Physical Exam  Constitutional: She is oriented to person, place, and time. She appears well-developed and well-nourished.  Neurological: She is alert and oriented to person, place, and time.  Skin: Skin is warm and dry. No rash noted. No erythema.  Psychiatric: She has a normal mood and affect. Her behavior is normal. Judgment and thought content normal.          Assessment & Plan:

## 2012-03-10 NOTE — Assessment & Plan Note (Signed)
?   If from simcor If occurs again we will consider holding simcor  rto prn

## 2012-08-02 ENCOUNTER — Other Ambulatory Visit: Payer: Self-pay | Admitting: Family Medicine

## 2012-08-02 NOTE — Telephone Encounter (Signed)
Rx sent.    MW 

## 2012-09-09 ENCOUNTER — Telehealth: Payer: Self-pay | Admitting: Family Medicine

## 2012-09-09 DIAGNOSIS — K219 Gastro-esophageal reflux disease without esophagitis: Secondary | ICD-10-CM

## 2012-09-09 MED ORDER — DEXLANSOPRAZOLE 60 MG PO CPDR: 60.0000 mg | DELAYED_RELEASE_CAPSULE | Freq: Every day | ORAL | Status: DC

## 2012-09-09 NOTE — Telephone Encounter (Signed)
Dexilant DR 60 MG capsule Qty: 30 Last fill:11.24.13 Take 1 capsule by mouth once daily

## 2012-09-09 NOTE — Telephone Encounter (Signed)
Refill for dexilant sent to pharmacy

## 2012-09-12 ENCOUNTER — Other Ambulatory Visit: Payer: Self-pay | Admitting: Family Medicine

## 2012-09-12 MED ORDER — DEXLANSOPRAZOLE 60 MG PO CPDR: 60.0000 mg | DELAYED_RELEASE_CAPSULE | Freq: Every day | ORAL | Status: DC

## 2012-09-12 NOTE — Addendum Note (Signed)
Addended by: Arnette Norris on: 09/12/2012 02:22 PM   Modules accepted: Orders

## 2012-09-28 ENCOUNTER — Other Ambulatory Visit: Payer: Self-pay | Admitting: Family Medicine

## 2012-10-04 ENCOUNTER — Telehealth: Payer: Self-pay | Admitting: *Deleted

## 2012-10-04 MED ORDER — SITAGLIP PHOS-METFORMIN HCL ER 100-1000 MG PO TB24: 1.0000 | ORAL_TABLET | Freq: Every day | ORAL | Status: DC

## 2012-10-04 NOTE — Telephone Encounter (Signed)
No PA available Pt must try Janumet or Jentadueto.Please advise

## 2012-10-04 NOTE — Telephone Encounter (Signed)
Tried calling the patient but the line was busy-- Rx faxed      KP

## 2012-10-04 NOTE — Telephone Encounter (Signed)
janumet 100/1000 1 po qd  #30  2refills

## 2012-10-05 NOTE — Telephone Encounter (Signed)
Patient made aware Rx has been faxed     KP

## 2012-10-29 ENCOUNTER — Other Ambulatory Visit: Payer: Self-pay | Admitting: Family Medicine

## 2012-12-02 ENCOUNTER — Ambulatory Visit (INDEPENDENT_AMBULATORY_CARE_PROVIDER_SITE_OTHER): Payer: Managed Care, Other (non HMO) | Admitting: Family Medicine

## 2012-12-02 ENCOUNTER — Encounter: Payer: Self-pay | Admitting: *Deleted

## 2012-12-02 ENCOUNTER — Encounter: Payer: Self-pay | Admitting: Family Medicine

## 2012-12-02 VITALS — BP 110/76 | HR 104 | Temp 98.1°F | Wt 293.0 lb

## 2012-12-02 DIAGNOSIS — I10 Essential (primary) hypertension: Secondary | ICD-10-CM

## 2012-12-02 DIAGNOSIS — E119 Type 2 diabetes mellitus without complications: Secondary | ICD-10-CM

## 2012-12-02 DIAGNOSIS — K219 Gastro-esophageal reflux disease without esophagitis: Secondary | ICD-10-CM

## 2012-12-02 DIAGNOSIS — M109 Gout, unspecified: Secondary | ICD-10-CM

## 2012-12-02 DIAGNOSIS — E876 Hypokalemia: Secondary | ICD-10-CM

## 2012-12-02 DIAGNOSIS — E785 Hyperlipidemia, unspecified: Secondary | ICD-10-CM

## 2012-12-02 MED ORDER — ALLOPURINOL 300 MG PO TABS: 300.0000 mg | ORAL_TABLET | Freq: Two times a day (BID) | ORAL | Status: DC

## 2012-12-02 MED ORDER — VALSARTAN-HYDROCHLOROTHIAZIDE 160-25 MG PO TABS: 1.0000 | ORAL_TABLET | Freq: Every day | ORAL | Status: DC

## 2012-12-02 MED ORDER — GLUCOSE BLOOD VI STRP: ORAL_STRIP | Status: DC

## 2012-12-02 MED ORDER — NIACIN-SIMVASTATIN ER 1000-40 MG PO TB24: ORAL_TABLET | ORAL | Status: DC

## 2012-12-02 MED ORDER — DEXLANSOPRAZOLE 60 MG PO CPDR: 60.0000 mg | DELAYED_RELEASE_CAPSULE | Freq: Every day | ORAL | Status: DC

## 2012-12-02 MED ORDER — POTASSIUM CHLORIDE 20 MEQ PO PACK: 20.0000 meq | PACK | Freq: Every day | ORAL | Status: DC

## 2012-12-02 MED ORDER — SITAGLIP PHOS-METFORMIN HCL ER 100-1000 MG PO TB24: 1.0000 | ORAL_TABLET | Freq: Every day | ORAL | Status: DC

## 2012-12-02 NOTE — Patient Instructions (Addendum)
Diabetes Meal Planning Guide The diabetes meal planning guide is a tool to help you plan your meals and snacks. It is important for people with diabetes to manage their blood glucose (sugar) levels. Choosing the right foods and the right amounts throughout your day will help control your blood glucose. Eating right can even help you improve your blood pressure and reach or maintain a healthy weight. CARBOHYDRATE COUNTING MADE EASY When you eat carbohydrates, they turn to sugar. This raises your blood glucose level. Counting carbohydrates can help you control this level so you feel better. When you plan your meals by counting carbohydrates, you can have more flexibility in what you eat and balance your medicine with your food intake. Carbohydrate counting simply means adding up the total amount of carbohydrate grams in your meals and snacks. Try to eat about the same amount at each meal. Foods with carbohydrates are listed below. Each portion below is 1 carbohydrate serving or 15 grams of carbohydrates. Ask your dietician how many grams of carbohydrates you should eat at each meal or snack. Grains and Starches  1 slice bread.   English muffin or hotdog/hamburger bun.   cup cold cereal (unsweetened).   cup cooked pasta or rice.   cup starchy vegetables (corn, potatoes, peas, beans, winter squash).  1 tortilla (6 inches).   bagel.  1 waffle or pancake (size of a CD).   cup cooked cereal.  4 to 6 small crackers. *Whole grain is recommended. Fruit  1 cup fresh unsweetened berries, melon, papaya, pineapple.  1 small fresh fruit.   banana or mango.   cup fruit juice (4 oz unsweetened).   cup canned fruit in natural juice or water.  2 tbs dried fruit.  12 to 15 grapes or cherries. Milk and Yogurt  1 cup fat-free or 1% milk.  1 cup soy milk.  6 oz light yogurt with sugar-free sweetener.  6 oz low-fat soy yogurt.  6 oz plain yogurt. Vegetables  1 cup raw or  cup  cooked is counted as 0 carbohydrates or a "free" food.  If you eat 3 or more servings at 1 meal, count them as 1 carbohydrate serving. Other Carbohydrates   oz chips or pretzels.   cup ice cream or frozen yogurt.   cup sherbet or sorbet.  2 inch square cake, no frosting.  1 tbs honey, sugar, jam, jelly, or syrup.  2 small cookies.  3 squares of graham crackers.  3 cups popcorn.  6 crackers.  1 cup broth-based soup.  Count 1 cup casserole or other mixed foods as 2 carbohydrate servings.  Foods with less than 20 calories in a serving may be counted as 0 carbohydrates or a "free" food. You may want to purchase a book or computer software that lists the carbohydrate gram counts of different foods. In addition, the nutrition facts panel on the labels of the foods you eat are a good source of this information. The label will tell you how big the serving size is and the total number of carbohydrate grams you will be eating per serving. Divide this number by 15 to obtain the number of carbohydrate servings in a portion. Remember, 1 carbohydrate serving equals 15 grams of carbohydrate. SERVING SIZES Measuring foods and serving sizes helps you make sure you are getting the right amount of food. The list below tells how big or small some common serving sizes are.  1 oz.........4 stacked dice.  3 oz........Marland KitchenDeck of cards.  1 tsp.......Marland KitchenTip  of little finger.  1 tbs......Marland KitchenMarland KitchenThumb.  2 tbs.......Marland KitchenGolf ball.   cup......Marland KitchenHalf of a fist.  1 cup.......Marland KitchenA fist. SAMPLE DIABETES MEAL PLAN Below is a sample meal plan that includes foods from the grain and starches, dairy, vegetable, fruit, and meat groups. A dietician can individualize a meal plan to fit your calorie needs and tell you the number of servings needed from each food group. However, controlling the total amount of carbohydrates in your meal or snack is more important than making sure you include all of the food groups at every  meal. You may interchange carbohydrate containing foods (dairy, starches, and fruits). The meal plan below is an example of a 2000 calorie diet using carbohydrate counting. This meal plan has 17 carbohydrate servings. Breakfast  1 cup oatmeal (2 carb servings).   cup light yogurt (1 carb serving).  1 cup blueberries (1 carb serving).   cup almonds. Snack  1 large apple (2 carb servings).  1 low-fat string cheese stick. Lunch  Chicken breast salad.  1 cup spinach.   cup chopped tomatoes.  2 oz chicken breast, sliced.  2 tbs low-fat [place] dressing.  12 whole-wheat crackers (2 carb servings).  12 to 15 grapes (1 carb serving).  1 cup low-fat milk (1 carb serving). Snack  1 cup carrots.   cup hummus (1 carb serving). Dinner  3 oz broiled salmon.  1 cup brown rice (3 carb servings). Snack  1  cups steamed broccoli (1 carb serving) drizzled with 1 tsp olive oil and lemon juice.  1 cup light pudding (2 carb servings). DIABETES MEAL PLANNING WORKSHEET Your dietician can use this worksheet to help you decide how many servings of foods and what types of foods are right for you.  BREAKFAST Food Group and Servings / Carb Servings Grain/Starches __________________________________ Dairy __________________________________________ Vegetable ______________________________________ Fruit ___________________________________________ Meat __________________________________________ Fat ____________________________________________ LUNCH Food Group and Servings / Carb Servings Grain/Starches ___________________________________ Dairy ___________________________________________ Fruit ____________________________________________ Meat ___________________________________________ Fat _____________________________________________ Rachel Black Food Group and Servings / Carb Servings Grain/Starches ___________________________________ Dairy  ___________________________________________ Fruit ____________________________________________ Meat ___________________________________________ Fat _____________________________________________ SNACKS Food Group and Servings / Carb Servings Grain/Starches ___________________________________ Dairy ___________________________________________ Vegetable _______________________________________ Fruit ____________________________________________ Meat ___________________________________________ Fat _____________________________________________ DAILY TOTALS Starches _________________________ Vegetable ________________________ Fruit ____________________________ Dairy ____________________________ Meat ____________________________ Fat ______________________________ Document Released: 05/28/2005 Document Revised: 11/23/2011 Document Reviewed: 04/08/2009 ExitCare Patient Information 2013 [place], [place].

## 2012-12-02 NOTE — Assessment & Plan Note (Signed)
Check labs con't meds 

## 2012-12-02 NOTE — Assessment & Plan Note (Signed)
Check labs Con' meds

## 2012-12-02 NOTE — Progress Notes (Signed)
  Subjective:    Patient ID: Rachel Black, female    DOB: November 01, 1950, 62 y.o.   MRN: [id number]  HPI HYPERTENSION Disease Monitoring Blood pressure range-not checking at home Chest pain- no      Dyspnea- no Medications Compliance- good Lightheadedness- no   Edema- yes   DIABETES Disease Monitoring Blood Sugar ranges-90-120 Polyuria- no New Visual problems- no Medications Compliance- good Hypoglycemic symptoms- no   HYPERLIPIDEMIA Disease Monitoring See symptoms for Hypertension Medications Compliance- good RUQ pain- no  Muscle aches- no  ROS See HPI above   PMH Smoking Status noted     Review of Systems As above    Objective:   Physical Exam  BP 110/76  Pulse 104  Temp(Src) 98.1 F (36.7 C) (Oral)  Wt 293 lb (132.904 kg)  BMI 48 kg/m2  SpO2 93% General appearance: alert, cooperative, appears stated age and no distress Eyes: conjunctivae/corneas clear. PERRL, EOM's intact. Fundi benign. Lungs: clear to auscultation bilaterally Heart: regular rate and rhythm, S1, S2 normal, no murmur, click, rub or gallop Extremities: edema r ankle      Assessment & Plan:

## 2012-12-02 NOTE — Assessment & Plan Note (Signed)
>>  ASSESSMENT AND PLAN FOR MIXED HYPERLIPIDEMIA WRITTEN ON 12/02/2012  3:09 PM BY LOWNE, YVONNE R, DO  Check labs con't meds

## 2012-12-02 NOTE — Assessment & Plan Note (Signed)
Stable con't meds 

## 2012-12-02 NOTE — Assessment & Plan Note (Signed)
>>  ASSESSMENT AND PLAN FOR ESSENTIAL HYPERTENSION WRITTEN ON 12/02/2012  3:09 PM BY LOWNE, YVONNE R, DO  Stable  con't meds

## 2012-12-05 ENCOUNTER — Encounter: Payer: Self-pay | Admitting: Internal Medicine

## 2012-12-06 ENCOUNTER — Telehealth: Payer: Self-pay | Admitting: *Deleted

## 2012-12-07 ENCOUNTER — Telehealth: Payer: Self-pay | Admitting: Family Medicine

## 2012-12-12 ENCOUNTER — Telehealth: Payer: Self-pay | Admitting: *Deleted

## 2012-12-12 NOTE — Telephone Encounter (Signed)
Prior Auth approved 12-07-12 until 12-08-15, approval letter scan to chart.

## 2012-12-16 ENCOUNTER — Encounter: Payer: Self-pay | Admitting: Family Medicine

## 2012-12-16 ENCOUNTER — Other Ambulatory Visit (INDEPENDENT_AMBULATORY_CARE_PROVIDER_SITE_OTHER): Payer: Managed Care, Other (non HMO)

## 2012-12-16 DIAGNOSIS — I10 Essential (primary) hypertension: Secondary | ICD-10-CM

## 2012-12-16 DIAGNOSIS — E119 Type 2 diabetes mellitus without complications: Secondary | ICD-10-CM

## 2012-12-16 DIAGNOSIS — E785 Hyperlipidemia, unspecified: Secondary | ICD-10-CM

## 2012-12-16 LAB — HEPATIC FUNCTION PANEL
ALT: 23 U/L (ref 0–35)
AST: 20 U/L (ref 0–37)
Bilirubin, Direct: 0 mg/dL (ref 0.0–0.3)
Total Bilirubin: 0.6 mg/dL (ref 0.3–1.2)
Total Protein: 6.8 g/dL (ref 6.0–8.3)

## 2012-12-16 LAB — BASIC METABOLIC PANEL
BUN: 20 mg/dL (ref 6–23)
Calcium: 9.1 mg/dL (ref 8.4–10.5)
Creatinine, Ser: 0.9 mg/dL (ref 0.4–1.2)
GFR: 70.47 mL/min (ref >60.00)
Glucose, Bld: 112 mg/dL — ABNORMAL HIGH (ref 70–99)
Potassium: 3.8 mEq/L (ref 3.5–5.1)

## 2012-12-16 LAB — MICROALBUMIN / CREATININE URINE RATIO: Microalb, Ur: 0.7 mg/dL (ref 0.0–1.9)

## 2012-12-16 LAB — LIPID PANEL
Cholesterol: 131 mg/dL (ref 0–200)
VLDL: 18 mg/dL (ref 0.0–40.0)

## 2013-01-04 ENCOUNTER — Ambulatory Visit (INDEPENDENT_AMBULATORY_CARE_PROVIDER_SITE_OTHER): Payer: Managed Care, Other (non HMO) | Admitting: Internal Medicine

## 2013-01-04 ENCOUNTER — Encounter: Payer: Self-pay | Admitting: Internal Medicine

## 2013-01-04 VITALS — BP 124/74 | HR 71 | Ht 64.5 in | Wt 312.0 lb

## 2013-01-04 DIAGNOSIS — Z86010 Personal history of colonic polyps: Secondary | ICD-10-CM

## 2013-01-04 DIAGNOSIS — E119 Type 2 diabetes mellitus without complications: Secondary | ICD-10-CM

## 2013-01-04 DIAGNOSIS — K219 Gastro-esophageal reflux disease without esophagitis: Secondary | ICD-10-CM

## 2013-01-04 MED ORDER — MOVIPREP 100 G PO SOLR: 1.0000 | Freq: Once | ORAL | Status: DC

## 2013-01-04 NOTE — Patient Instructions (Addendum)
You have been scheduled for an endoscopy and colonoscopy with propofol at [facility].  Please follow the written instructions given to you at your visit today. Please pick up your prep at the pharmacy within the next 1-3 days. If you use inhalers (even only as needed), please bring them with you on the day of your procedure. Your physician has requested that you go to www.startemmi.com and enter the access code given to you at your visit today. This web site gives a general overview about your procedure. However, you should still follow specific instructions given to you by our office regarding your preparation for the procedure.

## 2013-01-04 NOTE — Progress Notes (Signed)
HISTORY OF PRESENT ILLNESS:  Rachel Black is a 62 y.o. female with hypertension, hyperlipidemia, diabetes mellitus, osteoarthritis, chronic GERD, morbid obesity, and adenomatous colon polyps. Patient presents today regarding refractory reflux symptoms, at the request of her primary provider. The patient has chronic reflux disease dating back greater than 10 years. For reflux symptoms she underwent upper endoscopy in May of 2004. She was found to have a distal esophageal stricture (incidental), a hiatal hernia, and fundic gland polyps. She was advised to implement strict reflux precautions and continue proton pump inhibitor therapy. She has not been seen since. Patient reports that her reflux symptoms have worsened over the past year. She describes regurgitation and pyrosis, particularly when lying at night. She denies dysphagia. She had been on Nexium daily for years. She was subsequently changed to Dexilant 60 mg daily without significant improvement. Subsequently asked to add Prilosec at night. This helped somewhat, though incompletely. She has had no significant change in her weight over the past few years. However, her BMI is greater than 50. She is accompanied today by her husband. Her only other GI complaint is intermittent diarrhea once or twice per week. She does take metformin. No bleeding. Patient underwent complete colonoscopy in May of 2004. She was found to have 4 polyps measuring between 3 and 8 mm. These were adenomatous. In addition diverticulosis. Followup colonoscopy was commended in 2 years. She did not followup. She is accompanied by her husband.  REVIEW OF SYSTEMS:  All non-GI ROS negative except for arthritis, ankle swelling  Past Medical History  Diagnosis Date  . Hyperlipidemia   . Hypertension   . Gout   . Diabetes mellitus   . Arthritis   . GERD (gastroesophageal reflux disease)     Past Surgical History  Procedure Laterality Date  . Ventral hernia repair      x2  .  Partial hysterectomy    . Cesarean section      Social History Rachel Black  reports that she has never smoked. She has never used smokeless tobacco. She reports that she does not drink alcohol or use illicit drugs.  family history includes Crohn's disease in her son; Diabetes in her father and mother; Heart disease in her father; Hyperlipidemia in her father; Hypertension in her mother; Irritable bowel syndrome in her son; and Stroke in her father.  Allergies  Allergen Reactions  . Aspirin     Regular ASA  . Penicillins        PHYSICAL EXAMINATION: Vital signs: BP 124/74  Pulse 71  Ht 5' 4.5" (1.638 m)  Wt 312 lb (141.522 kg)  BMI 52.75 kg/m2  Constitutional: Morbidly obese, unhealthy-appearing, no acute distress Psychiatric: Pleasant, alert and oriented x3, cooperative Eyes: extraocular movements intact, anicteric, conjunctiva pink Mouth: oral pharynx moist, no lesions Neck: Thick, supple no lymphadenopathy Cardiovascular: heart regular rate and rhythm, no murmur Lungs: clear to auscultation bilaterally Abdomen: soft, massively obese, nontender, nondistended, no obvious ascites, no peritoneal signs, normal bowel sounds, no organomegaly Rectal: Deferred until colonoscopy Extremities: no lower extremity edema bilaterally Skin: no lesions on visible extremities Neuro: No focal deficits.   ASSESSMENT:  #1. Worsening reflux symptoms over the past year. Symptoms refractory to escalating doses of PPI. #2. History of adenomatous colon polyps 2004. Overdue for surveillance #3. Multiple medical problems. The most significant of which is her morbid obesity   PLAN:  #1. Reflux precautions with great attention to weight loss. These were personally reviewed and literature provided #2. Diagnostic  upper endoscopy to further evaluate new, worsening, and not responding to medical therapy reflux symptoms.The nature of the procedure, as well as the risks, benefits, and alternatives  were carefully and thoroughly reviewed with the patient. Ample time for discussion and questions allowed. The patient understood, was satisfied, and agreed to proceed. #3. Surveillance colonoscopy. The patient is high-risk given her comorbidities and body habitus. Her procedure will be performed at the hospital with anesthesia supervision.The nature of the procedure, as well as the risks, benefits, and alternatives were carefully and thoroughly reviewed with the patient. Ample time for discussion and questions allowed. The patient understood, was satisfied, and agreed to proceed. #4. Movi prep prescribed. Patient instructed on his use #5. Hold diabetic medications the day of the exam #6. Work excuse note for her husband as requested by her husband

## 2013-01-06 ENCOUNTER — Encounter (HOSPITAL_COMMUNITY): Payer: Self-pay | Admitting: *Deleted

## 2013-01-17 ENCOUNTER — Encounter (HOSPITAL_COMMUNITY): Payer: Self-pay | Admitting: Pharmacy Technician

## 2013-01-30 ENCOUNTER — Ambulatory Visit (HOSPITAL_COMMUNITY)
Admission: RE | Admit: 2013-01-30 | Discharge: 2013-01-30 | Disposition: A | Discharge status: Home / Self Care | Payer: Managed Care, Other (non HMO) | Source: Ambulatory Visit | Attending: Internal Medicine | Admitting: Internal Medicine

## 2013-01-30 ENCOUNTER — Ambulatory Visit (HOSPITAL_COMMUNITY): Payer: Managed Care, Other (non HMO) | Admitting: Anesthesiology

## 2013-01-30 ENCOUNTER — Other Ambulatory Visit: Payer: Self-pay | Admitting: Internal Medicine

## 2013-01-30 ENCOUNTER — Encounter (HOSPITAL_COMMUNITY)
Admission: RE | Disposition: A | Discharge status: Home / Self Care | Payer: Self-pay | Source: Ambulatory Visit | Attending: Internal Medicine

## 2013-01-30 ENCOUNTER — Encounter (HOSPITAL_COMMUNITY): Payer: Self-pay | Admitting: *Deleted

## 2013-01-30 ENCOUNTER — Encounter (HOSPITAL_COMMUNITY): Payer: Self-pay | Admitting: Anesthesiology

## 2013-01-30 DIAGNOSIS — Z1211 Encounter for screening for malignant neoplasm of colon: Secondary | ICD-10-CM

## 2013-01-30 DIAGNOSIS — Z7982 Long term (current) use of aspirin: Secondary | ICD-10-CM | POA: Insufficient documentation

## 2013-01-30 DIAGNOSIS — K648 Other hemorrhoids: Secondary | ICD-10-CM | POA: Insufficient documentation

## 2013-01-30 DIAGNOSIS — K2970 Gastritis, unspecified, without bleeding: Secondary | ICD-10-CM | POA: Insufficient documentation

## 2013-01-30 DIAGNOSIS — Z79899 Other long term (current) drug therapy: Secondary | ICD-10-CM | POA: Insufficient documentation

## 2013-01-30 DIAGNOSIS — D131 Benign neoplasm of stomach: Secondary | ICD-10-CM | POA: Insufficient documentation

## 2013-01-30 DIAGNOSIS — K5730 Diverticulosis of large intestine without perforation or abscess without bleeding: Secondary | ICD-10-CM | POA: Insufficient documentation

## 2013-01-30 DIAGNOSIS — K2990 Gastroduodenitis, unspecified, without bleeding: Secondary | ICD-10-CM | POA: Insufficient documentation

## 2013-01-30 DIAGNOSIS — I739 Peripheral vascular disease, unspecified: Secondary | ICD-10-CM | POA: Insufficient documentation

## 2013-01-30 DIAGNOSIS — K449 Diaphragmatic hernia without obstruction or gangrene: Secondary | ICD-10-CM | POA: Insufficient documentation

## 2013-01-30 DIAGNOSIS — Z86010 Personal history of colonic polyps: Secondary | ICD-10-CM

## 2013-01-30 DIAGNOSIS — E119 Type 2 diabetes mellitus without complications: Secondary | ICD-10-CM | POA: Insufficient documentation

## 2013-01-30 DIAGNOSIS — K219 Gastro-esophageal reflux disease without esophagitis: Secondary | ICD-10-CM

## 2013-01-30 DIAGNOSIS — I10 Essential (primary) hypertension: Secondary | ICD-10-CM | POA: Insufficient documentation

## 2013-01-30 DIAGNOSIS — D126 Benign neoplasm of colon, unspecified: Secondary | ICD-10-CM | POA: Insufficient documentation

## 2013-01-30 HISTORY — PX: COLONOSCOPY WITH PROPOFOL: SHX5780

## 2013-01-30 HISTORY — PX: ESOPHAGOGASTRODUODENOSCOPY (EGD) WITH PROPOFOL: SHX5813

## 2013-01-30 SURGERY — ESOPHAGOGASTRODUODENOSCOPY (EGD) WITH PROPOFOL
Anesthesia: Monitor Anesthesia Care

## 2013-01-30 MED ORDER — PROPOFOL INFUSION 10 MG/ML OPTIME
INTRAVENOUS | Status: DC | PRN
  Administered 2013-01-30: 180 ug/kg/min via INTRAVENOUS

## 2013-01-30 MED ORDER — BUTAMBEN-TETRACAINE-BENZOCAINE 2-2-14 % EX AERO
INHALATION_SPRAY | CUTANEOUS | Status: DC | PRN
  Administered 2013-01-30: 2 via TOPICAL

## 2013-01-30 MED ORDER — KETAMINE HCL 10 MG/ML IJ SOLN
INTRAMUSCULAR | Status: DC | PRN
  Administered 2013-01-30 (×3): 20 mg via INTRAVENOUS

## 2013-01-30 MED ORDER — MIDAZOLAM HCL 5 MG/5ML IJ SOLN
INTRAMUSCULAR | Status: DC | PRN
  Administered 2013-01-30: 2 mg via INTRAVENOUS

## 2013-01-30 MED ORDER — SODIUM CHLORIDE 0.9 % IV SOLN: INTRAVENOUS | Status: DC

## 2013-01-30 MED ORDER — LACTATED RINGERS IV SOLN
INTRAVENOUS | Status: DC | PRN
  Administered 2013-01-30: 13:00:00 via INTRAVENOUS

## 2013-01-30 SURGICAL SUPPLY — 24 items

## 2013-01-30 NOTE — Anesthesia Preprocedure Evaluation (Signed)
Anesthesia Evaluation  Patient identified by MRN, date of birth, ID band Patient awake    Reviewed: Allergy & Precautions, H&P , NPO status , Patient's Chart, lab work & pertinent test results  Airway Mallampati: II TM Distance: >3 FB Neck ROM: Full    Dental no notable dental hx.    Pulmonary neg pulmonary ROS,  breath sounds clear to auscultation  Pulmonary exam normal       Cardiovascular Exercise Tolerance: Good hypertension, Pt. on medications + Peripheral Vascular Disease Rhythm:Regular Rate:Normal     Neuro/Psych negative neurological ROS  negative psych ROS   GI/Hepatic Neg liver ROS, GERD-  Medicated,  Endo/Other  diabetes, Type 2, Oral Hypoglycemic AgentsMorbid obesity  Renal/GU negative Renal ROS  negative genitourinary   Musculoskeletal negative musculoskeletal ROS (+)   Abdominal (+) + obese,   Peds negative pediatric ROS (+)  Hematology negative hematology ROS (+)   Anesthesia Other Findings   Reproductive/Obstetrics negative OB ROS                           Anesthesia Physical Anesthesia Plan  ASA: III  Anesthesia Plan: MAC   Post-op Pain Management:    Induction: Intravenous  Airway Management Planned:   Additional Equipment:   Intra-op Plan:   Post-operative Plan:   Informed Consent: I have reviewed the patients History and Physical, chart, labs and discussed the procedure including the risks, benefits and alternatives for the proposed anesthesia with the patient or authorized representative who has indicated his/her understanding and acceptance.   Dental advisory given  Plan Discussed with: CRNA  Anesthesia Plan Comments:         Anesthesia Quick Evaluation

## 2013-01-30 NOTE — Anesthesia Postprocedure Evaluation (Signed)
  Anesthesia Post-op Note  Patient: Rachel Black  Procedure(s) Performed: Procedure(s) (LRB): ESOPHAGOGASTRODUODENOSCOPY (EGD) WITH PROPOFOL (N/A) COLONOSCOPY WITH PROPOFOL (N/A)  Patient Location: PACU  Anesthesia Type: MAC  Level of Consciousness: awake and alert   Airway and Oxygen Therapy: Patient Spontanous Breathing  Post-op Pain: mild  Post-op Assessment: Post-op Vital signs reviewed, Patient's Cardiovascular Status Stable, Respiratory Function Stable, Patent Airway and No signs of Nausea or vomiting  Last Vitals:  Filed Vitals:   01/30/13 1340  BP: 105/57  Temp:   Resp: 13    Post-op Vital Signs: stable   Complications: No apparent anesthesia complications

## 2013-01-30 NOTE — Transfer of Care (Signed)
Immediate Anesthesia Transfer of Care Note  Patient: Rachel Black  Procedure(s) Performed: Procedure(s): ESOPHAGOGASTRODUODENOSCOPY (EGD) WITH PROPOFOL (N/A) COLONOSCOPY WITH PROPOFOL (N/A)  Patient Location: PACU and Endoscopy Unit  Anesthesia Type:MAC  Level of Consciousness: awake and alert   Airway & Oxygen Therapy: Patient Spontanous Breathing and Patient connected to nasal cannula oxygen  Post-op Assessment: Report given to PACU RN and Post -op Vital signs reviewed and stable  Post vital signs: Reviewed and stable  Complications: No apparent anesthesia complications

## 2013-01-30 NOTE — Op Note (Signed)
[facility] [address] [place], [address]   COLONOSCOPY PROCEDURE REPORT  PATIENT: Rachel Black, Rachel Black  MR#: [id number] BIRTHDATE: May 18, 1951 , 61  yrs. old GENDER: Female ENDOSCOPIST: Roxy Cedar, MD REFERRED AO:[id number] Program Recall PROCEDURE DATE:  01/30/2013 PROCEDURE:   Colonoscopy with snare polypectomy    x 1 ASA CLASS:   Class III INDICATIONS:Patient's personal history of adenomatous colon polyps. Index 01-2003 (4 polyps < 9mm) MEDICATIONS: MAC sedation, administered by CRNA and See Anesthesia Report.  DESCRIPTION OF PROCEDURE:   After the risks benefits and alternatives of the procedure were thoroughly explained, informed consent was obtained.  A digital rectal exam revealed no abnormalities of the rectum.   The EC-[id number] ([id number])  endoscope was introduced through the anus and advanced to the cecum, which was identified by both the appendix and ileocecal valve. No adverse events experienced.   The quality of the prep was good, using MoviPrep  The instrument was then slowly withdrawn as the colon was fully examined.      COLON FINDINGS: A single polyp measuring 5 mm in size was found in the descending colon.  A polypectomy was performed with a cold snare.  The resection was complete and the polyp tissue was completely retrieved.   Severe diverticulosis was noted The finding was in the left colon.   The colon mucosa was otherwise normal. Retroflexed views revealed internal hemorrhoids. The time to cecum=3 minutes 0 seconds.  Withdrawal time=12 minutes 0 seconds. The scope was withdrawn and the procedure completed. COMPLICATIONS: There were no complications.  ENDOSCOPIC IMPRESSION: 1.   Single polyp measuring 5 mm in size was found in the descending colon; polypectomy was performed with a cold snare 2.   Severe diverticulosis was noted in the left colon 3.   The colon mucosa was otherwise normal  RECOMMENDATIONS: 1. Follow up  colonoscopy in 5 years 2. EGD today (see report)   eSigned:  Roxy Cedar, MD 01/30/2013 1:21 PM   cc: The Patient and Lelon Perla, DO   PATIENT NAME:  [name], [name] MR#: [id number]

## 2013-01-30 NOTE — Op Note (Signed)
[facility] [address] [place], [address]   ENDOSCOPY PROCEDURE REPORT  PATIENT: Rachel, Black  MR#: [id number] BIRTHDATE: [birth year] , 61  yrs. old GENDER: Female ENDOSCOPIST: Roxy Cedar, MD REFERRED BY:  .  Self / Office PROCEDURE DATE:  01/30/2013 PROCEDURE:  EGD w/ biopsy ASA CLASS:     Class III INDICATIONS:  History of esophageal reflux > 10 yrs.  Despite PPI, having symptoms MEDICATIONS: MAC sedation, administered by CRNA and See Anesthesia Report. TOPICAL ANESTHETIC: Cetacaine Spray  DESCRIPTION OF PROCEDURE: After the risks benefits and alternatives of the procedure were thoroughly explained, informed consent was obtained.  The PENTAX GASTOROSCOPE [id number] endoscope was introduced through the mouth and advanced to the second portion of the duodenum. Without limitations.  The instrument was slowly withdrawn as the mucosa was fully examined.      EXAM: Normal esophagus.  Patulous GEJ.  Mutiple benign inflammatory gastric polyps, mostly < 10mm.  Two larger polypsa biopsied. Otherwise normal stomach.  Normal duodenum.  Retroflexed views revealed a hiatal hernia.     The scope was then withdrawn from the patient and the procedure completed.  COMPLICATIONS: There were no complications. ENDOSCOPIC IMPRESSION: 1. GERD 2. Benign appearing gastric polyps - biopsied  RECOMMENDATIONS: 1.  Anti-reflux regimen to be followed 2.  Continue PPI therapy 3.  Await biopsy results (Dr Marina Goodell will send you a letter) 4. Return to the care of Dr Laury Axon  REPEAT EXAM:  eSigned:  Roxy Cedar, MD 01/30/2013 1:36 PM   XB:JYNWGN Marjory Lies, DO and The Patient

## 2013-01-30 NOTE — H&P (View-Only) (Signed)
HISTORY OF PRESENT ILLNESS:  Rachel Black is a 62 y.o. female with hypertension, hyperlipidemia, diabetes mellitus, osteoarthritis, chronic GERD, morbid obesity, and adenomatous colon polyps. Patient presents today regarding refractory reflux symptoms, at the request of her primary provider. The patient has chronic reflux disease dating back greater than 10 years. For reflux symptoms she underwent upper endoscopy in May of 2004. She was found to have a distal esophageal stricture (incidental), a hiatal hernia, and fundic gland polyps. She was advised to implement strict reflux precautions and continue proton pump inhibitor therapy. She has not been seen since. Patient reports that her reflux symptoms have worsened over the past year. She describes regurgitation and pyrosis, particularly when lying at night. She denies dysphagia. She had been on Nexium daily for years. She was subsequently changed to Dexilant 60 mg daily without significant improvement. Subsequently asked to add Prilosec at night. This helped somewhat, though incompletely. She has had no significant change in her weight over the past few years. However, her BMI is greater than 50. She is accompanied today by her husband. Her only other GI complaint is intermittent diarrhea once or twice per week. She does take metformin. No bleeding. Patient underwent complete colonoscopy in May of 2004. She was found to have 4 polyps measuring between 3 and 8 mm. These were adenomatous. In addition diverticulosis. Followup colonoscopy was commended in 2 years. She did not followup. She is accompanied by her husband.  REVIEW OF SYSTEMS:  All non-GI ROS negative except for arthritis, ankle swelling  Past Medical History  Diagnosis Date  . Hyperlipidemia   . Hypertension   . Gout   . Diabetes mellitus   . Arthritis   . GERD (gastroesophageal reflux disease)     Past Surgical History  Procedure Laterality Date  . Ventral hernia repair      x2  .  Partial hysterectomy    . Cesarean section      Social History Cherina K Dejarnette  reports that she has never smoked. She has never used smokeless tobacco. She reports that she does not drink alcohol or use illicit drugs.  family history includes Crohn's disease in her son; Diabetes in her father and mother; Heart disease in her father; Hyperlipidemia in her father; Hypertension in her mother; Irritable bowel syndrome in her son; and Stroke in her father.  Allergies  Allergen Reactions  . Aspirin     Regular ASA  . Penicillins        PHYSICAL EXAMINATION: Vital signs: BP 124/74  Pulse 71  Ht 5' 4.5" (1.638 m)  Wt 312 lb (141.522 kg)  BMI 52.75 kg/m2  Constitutional: Morbidly obese, unhealthy-appearing, no acute distress Psychiatric: Pleasant, alert and oriented x3, cooperative Eyes: extraocular movements intact, anicteric, conjunctiva pink Mouth: oral pharynx moist, no lesions Neck: Thick, supple no lymphadenopathy Cardiovascular: heart regular rate and rhythm, no murmur Lungs: clear to auscultation bilaterally Abdomen: soft, massively obese, nontender, nondistended, no obvious ascites, no peritoneal signs, normal bowel sounds, no organomegaly Rectal: Deferred until colonoscopy Extremities: no lower extremity edema bilaterally Skin: no lesions on visible extremities Neuro: No focal deficits.   ASSESSMENT:  #1. Worsening reflux symptoms over the past year. Symptoms refractory to escalating doses of PPI. #2. History of adenomatous colon polyps 2004. Overdue for surveillance #3. Multiple medical problems. The most significant of which is her morbid obesity   PLAN:  #1. Reflux precautions with great attention to weight loss. These were personally reviewed and literature provided #2. Diagnostic   upper endoscopy to further evaluate new, worsening, and not responding to medical therapy reflux symptoms.The nature of the procedure, as well as the risks, benefits, and alternatives  were carefully and thoroughly reviewed with the patient. Ample time for discussion and questions allowed. The patient understood, was satisfied, and agreed to proceed. #3. Surveillance colonoscopy. The patient is high-risk given her comorbidities and body habitus. Her procedure will be performed at the hospital with anesthesia supervision.The nature of the procedure, as well as the risks, benefits, and alternatives were carefully and thoroughly reviewed with the patient. Ample time for discussion and questions allowed. The patient understood, was satisfied, and agreed to proceed. #4. Movi prep prescribed. Patient instructed on his use #5. Hold diabetic medications the day of the exam #6. Work excuse note for her husband as requested by her husband 

## 2013-01-30 NOTE — Interval H&P Note (Signed)
History and Physical Interval Note:  01/30/2013 12:36 PM  Rachel Black  has presented today for surgery, with the diagnosis of reflux; V12.72  The various methods of treatment have been discussed with the patient and family. After consideration of risks, benefits and other options for treatment, the patient has consented to  Procedure(s): ESOPHAGOGASTRODUODENOSCOPY (EGD) WITH PROPOFOL (N/A) COLONOSCOPY WITH PROPOFOL (N/A) as a surgical intervention .  The patient's history has been reviewed, patient examined, no change in status, stable for surgery.  I have reviewed the patient's chart and labs.  Questions were answered to the patient's satisfaction.     Yancey Flemings

## 2013-01-31 ENCOUNTER — Encounter: Payer: Self-pay | Admitting: Internal Medicine

## 2013-02-01 ENCOUNTER — Encounter (HOSPITAL_COMMUNITY): Payer: Self-pay | Admitting: Internal Medicine

## 2013-03-03 ENCOUNTER — Ambulatory Visit: Payer: Managed Care, Other (non HMO) | Admitting: Family Medicine

## 2013-03-23 ENCOUNTER — Other Ambulatory Visit: Payer: Self-pay

## 2013-04-03 ENCOUNTER — Telehealth: Payer: Self-pay | Admitting: Family Medicine

## 2013-04-03 NOTE — Telephone Encounter (Signed)
Patient received letter in the mail advising her that she was due for a diabetic follow-up. It also states that she may be due for a Pneumococcal Vaccine. Patient would like to come get her vaccine this Friday and if she needs to see Dr. Etter Sjogren for her follow-up, she can due so when she gets back in August. Patient schedule for vaccine on Friday. Please advise if this is okay.

## 2013-04-03 NOTE — Telephone Encounter (Signed)
Ok to give pneumovac

## 2013-04-03 NOTE — Telephone Encounter (Signed)
Please advise if this is appropriate.      KP 

## 2013-04-05 ENCOUNTER — Telehealth: Payer: Self-pay | Admitting: *Deleted

## 2013-04-05 NOTE — Telephone Encounter (Signed)
Patient left a message on the triage phone requesting for Dr. Laury Axon to call her back in reference to a letter that was sent to her. Please Advise

## 2013-04-06 NOTE — Telephone Encounter (Signed)
Spoke with patient and she stated she was scheduled for her pneumovax. Reviewed chart and per Dr.Tabori she will not be due until next year. I cancelled the nurse visit apt and scheduled patient for her 6 mo follow up       KP

## 2013-04-07 ENCOUNTER — Ambulatory Visit: Payer: Managed Care, Other (non HMO)

## 2013-05-26 ENCOUNTER — Encounter: Payer: Self-pay | Admitting: Family Medicine

## 2013-05-26 ENCOUNTER — Ambulatory Visit (INDEPENDENT_AMBULATORY_CARE_PROVIDER_SITE_OTHER): Payer: Managed Care, Other (non HMO) | Admitting: Family Medicine

## 2013-05-26 ENCOUNTER — Ambulatory Visit: Payer: Managed Care, Other (non HMO) | Admitting: Family Medicine

## 2013-05-26 VITALS — BP 120/84 | HR 94 | Temp 98.2°F | Wt 316.0 lb

## 2013-05-26 DIAGNOSIS — E119 Type 2 diabetes mellitus without complications: Secondary | ICD-10-CM

## 2013-05-26 DIAGNOSIS — N390 Urinary tract infection, site not specified: Secondary | ICD-10-CM

## 2013-05-26 DIAGNOSIS — IMO0002 Reserved for concepts with insufficient information to code with codable children: Secondary | ICD-10-CM

## 2013-05-26 DIAGNOSIS — Z23 Encounter for immunization: Secondary | ICD-10-CM

## 2013-05-26 DIAGNOSIS — I10 Essential (primary) hypertension: Secondary | ICD-10-CM

## 2013-05-26 DIAGNOSIS — E1165 Type 2 diabetes mellitus with hyperglycemia: Secondary | ICD-10-CM

## 2013-05-26 DIAGNOSIS — E785 Hyperlipidemia, unspecified: Secondary | ICD-10-CM

## 2013-05-26 DIAGNOSIS — R109 Unspecified abdominal pain: Secondary | ICD-10-CM

## 2013-05-26 DIAGNOSIS — R197 Diarrhea, unspecified: Secondary | ICD-10-CM

## 2013-05-26 LAB — TSH: TSH: 2.26 u[IU]/mL (ref 0.35–5.50)

## 2013-05-26 LAB — POCT URINALYSIS DIPSTICK
Blood, UA: NEGATIVE
Glucose, UA: NEGATIVE
Ketones, UA: NEGATIVE
Spec Grav, UA: >=1.03
Urobilinogen, UA: 0.2

## 2013-05-26 LAB — BASIC METABOLIC PANEL
Calcium: 9.4 mg/dL (ref 8.4–10.5)
GFR: 57.91 mL/min — ABNORMAL LOW (ref >60.00)
Glucose, Bld: 112 mg/dL — ABNORMAL HIGH (ref 70–99)
Potassium: 3.6 mEq/L (ref 3.5–5.1)
Sodium: 139 mEq/L (ref 135–145)

## 2013-05-26 LAB — LIPID PANEL
HDL: 44.7 mg/dL (ref >39.00)
LDL Cholesterol: 36 mg/dL (ref 0–99)
Total CHOL/HDL Ratio: 2
VLDL: 28.2 mg/dL (ref 0.0–40.0)

## 2013-05-26 LAB — HEPATIC FUNCTION PANEL
AST: 19 U/L (ref 0–37)
Alkaline Phosphatase: 74 U/L (ref 39–117)
Bilirubin, Direct: 0.1 mg/dL (ref 0.0–0.3)
Total Bilirubin: 0.5 mg/dL (ref 0.3–1.2)

## 2013-05-26 LAB — MICROALBUMIN / CREATININE URINE RATIO
Creatinine,U: 244.3 mg/dL
Microalb, Ur: 1.8 mg/dL (ref 0.0–1.9)

## 2013-05-26 LAB — HEMOGLOBIN A1C: Hgb A1c MFr Bld: 5.9 % (ref 4.6–6.5)

## 2013-05-26 NOTE — Assessment & Plan Note (Signed)
>>  ASSESSMENT AND PLAN FOR ESSENTIAL HYPERTENSION WRITTEN ON 05/26/2013 11:52 AM BY LOWNE, YVONNE R, DO  Stable con't meds

## 2013-05-26 NOTE — Addendum Note (Signed)
Addended by: Arnette Norris on: 05/26/2013 01:45 PM   Modules accepted: Orders

## 2013-05-26 NOTE — Patient Instructions (Signed)
Diabetes and Standards of Medical Care  Diabetes is complicated. You may find that your diabetes team includes a dietitian, nurse, diabetes educator, eye doctor, and more. To help everyone know what is going on and to help you get the care you deserve, the following schedule of care was developed to help keep you on track. Below are the tests, exams, vaccines, medicines, education, and plans you will need. A1c test  Performed at least 2 times a year if you are meeting treatment goals.  Performed 4 times a year if therapy has changed or if you are not meeting treatment goals. Blood pressure test  Performed at every routine medical visit. The goal is less than 120/80 mmHg. Dental exam  Follow up with the dentist regularly. Eye exam  Diagnosed with type 1 diabetes as a child: Get an exam upon reaching the age of 10 years or older and having had diabetes for 3 5 years. Yearly eye exams are recommended after that initial eye exam.  Diagnosed with type 1 diabetes as an adult: Get an exam within 5 years of diagnosis and then yearly.  Diagnosed with type 2 diabetes: Get an exam as soon as possible after the diagnosis and then yearly. Foot care exam  Visual foot exams are performed at every routine medical visit. The exams check for cuts, injuries, or other problems with the feet.  A comprehensive foot exam should be done yearly. This includes visual inspection as well as assessing foot pulses and testing for loss of sensation. Kidney function test (urine microalbumin)  Performed once a year.  Type 1 diabetes: The first test is performed 5 years after diagnosis.  Type 2 diabetes: The first test is performed at the time of diagnosis.  A serum creatinine and estimated glomerular filtration rate (eGFR) test is done once a year to tell the level of chronic kidney disease (CKD), if present. Lipid profile (Cholesterol, HDL, LDL, Triglycerides)  Performed every 5 years for most people.  The  goal for LDL is less than 100 mg/dl. If at high risk, the goal is less than 70 mg/dl.  The goal for HDL is 40 mg/dl 50 mg/dl for men and 50 mg/dl 60 mg/dl for women. An HDL cholesterol of 60 mg/dL or higher gives some protection against heart disease.  The goal for triglycerides is less than 150 mg/dl. Influenza vaccine, pneumococcal vaccine, and hepatitis B vaccine  The influenza vaccine is recommended yearly.  The pneumococcal vaccine is generally given once in a lifetime. However, there are some instances when another vaccination is recommended. Check with your caregiver.  The hepatitis B vaccine is also recommended for adults with diabetes. Diabetes self-management education  Recommended at diagnosis and ongoing as needed. Treatment plan  Reviewed at every medical visit. Document Released: 06/28/2009 Document Revised: 08/17/2012 Document Reviewed: 03/03/2011 [employer] Patient Information 2014 [employer].  

## 2013-05-26 NOTE — Assessment & Plan Note (Signed)
>>  ASSESSMENT AND PLAN FOR MIXED HYPERLIPIDEMIA WRITTEN ON 05/26/2013 11:52 AM BY LOWNE, YVONNE R, DO  Check labs con't meds

## 2013-05-26 NOTE — Assessment & Plan Note (Signed)
Stable con't meds 

## 2013-05-26 NOTE — Assessment & Plan Note (Signed)
Check labs con't meds 

## 2013-05-26 NOTE — Assessment & Plan Note (Addendum)
Check labs D/w pt diet and exercise---pt not motivated Pt declined nutrition referral [employer] may be causing occasional abd pain--- pt does not want to stop med.  We will check labs and she will notify us if symptoms worsen.

## 2013-05-26 NOTE — Assessment & Plan Note (Signed)
Discussed diet and exercise Pt declined nutrition referral

## 2013-05-26 NOTE — Progress Notes (Signed)
  Subjective:    Patient ID: Rachel Black, female    DOB: 04-Jul-1951, 62 y.o.   MRN: [id number]  HPI HYPERTENSION Disease Monitoring Blood pressure range-not checking Chest pain- no      Dyspnea- no Medications Compliance- good Lightheadedness- no   Edema-    DIABETES Disease Monitoring Blood Sugar ranges-90-155 Polyuria- no New Visual problems- no Medications Compliance- good Hypoglycemic symptoms- no   HYPERLIPIDEMIA Disease Monitoring See symptoms for Hypertension Medications Compliance- good RUQ pain- no  Muscle aches- no  ROS See HPI above   PMH Smoking Status noted     Review of Systems As above    Objective:   Physical Exam BP 120/84  Pulse 94  Temp(Src) 98.2 F (36.8 C) (Oral)  Wt 316 lb (143.337 kg)  BMI 51.03 kg/m2  SpO2 97% General appearance: alert, cooperative, appears stated age and no distress Neck: no adenopathy, no carotid bruit, no JVD, supple, symmetrical, trachea midline and thyroid not enlarged, symmetric, no tenderness/mass/nodules Lungs: clear to auscultation bilaterally Heart: S1, S2 normal Extremities: extremities normal, atraumatic, no cyanosis or edema Sensory exam of the foot is normal, tested with the monofilament. Good pulses, no lesions or ulcers, good peripheral pulses.        Assessment & Plan:

## 2013-05-28 LAB — URINE CULTURE

## 2013-11-23 ENCOUNTER — Other Ambulatory Visit: Payer: Self-pay | Admitting: Family Medicine

## 2013-11-30 ENCOUNTER — Other Ambulatory Visit: Payer: Self-pay | Admitting: Family Medicine

## 2013-12-06 ENCOUNTER — Other Ambulatory Visit: Payer: Self-pay | Admitting: Family Medicine

## 2014-01-25 ENCOUNTER — Other Ambulatory Visit: Payer: Self-pay | Admitting: Family Medicine

## 2014-02-27 ENCOUNTER — Other Ambulatory Visit: Payer: Self-pay | Admitting: Family Medicine

## 2014-03-02 ENCOUNTER — Other Ambulatory Visit: Payer: Self-pay | Admitting: Family Medicine

## 2014-03-28 ENCOUNTER — Other Ambulatory Visit (HOSPITAL_COMMUNITY): Payer: Self-pay | Admitting: Orthopaedic Surgery

## 2014-03-29 ENCOUNTER — Ambulatory Visit: Payer: Managed Care, Other (non HMO) | Admitting: Family Medicine

## 2014-03-30 ENCOUNTER — Other Ambulatory Visit: Payer: Self-pay | Admitting: Family Medicine

## 2014-04-05 ENCOUNTER — Ambulatory Visit: Payer: Managed Care, Other (non HMO) | Admitting: Family Medicine

## 2014-04-06 ENCOUNTER — Encounter (HOSPITAL_COMMUNITY): Payer: Self-pay | Admitting: Pharmacy Technician

## 2014-04-10 ENCOUNTER — Ambulatory Visit (INDEPENDENT_AMBULATORY_CARE_PROVIDER_SITE_OTHER): Payer: BC Managed Care – PPO | Admitting: Family Medicine

## 2014-04-10 ENCOUNTER — Encounter: Payer: Self-pay | Admitting: Family Medicine

## 2014-04-10 VITALS — BP 120/64 | HR 108 | Temp 98.7°F | Wt 319.0 lb

## 2014-04-10 DIAGNOSIS — Z23 Encounter for immunization: Secondary | ICD-10-CM

## 2014-04-10 DIAGNOSIS — E1165 Type 2 diabetes mellitus with hyperglycemia: Secondary | ICD-10-CM

## 2014-04-10 DIAGNOSIS — K2100 Gastro-esophageal reflux disease with esophagitis, without bleeding: Secondary | ICD-10-CM

## 2014-04-10 DIAGNOSIS — I10 Essential (primary) hypertension: Secondary | ICD-10-CM

## 2014-04-10 DIAGNOSIS — E785 Hyperlipidemia, unspecified: Secondary | ICD-10-CM

## 2014-04-10 DIAGNOSIS — M109 Gout, unspecified: Secondary | ICD-10-CM

## 2014-04-10 DIAGNOSIS — IMO0002 Reserved for concepts with insufficient information to code with codable children: Secondary | ICD-10-CM

## 2014-04-10 DIAGNOSIS — R8290 Unspecified abnormal findings in urine: Secondary | ICD-10-CM

## 2014-04-10 DIAGNOSIS — E118 Type 2 diabetes mellitus with unspecified complications: Principal | ICD-10-CM

## 2014-04-10 DIAGNOSIS — R82998 Other abnormal findings in urine: Secondary | ICD-10-CM

## 2014-04-10 LAB — POCT URINALYSIS DIPSTICK
Bilirubin, UA: NEGATIVE
Blood, UA: NEGATIVE
GLUCOSE UA: NEGATIVE
Ketones, UA: NEGATIVE
NITRITE UA: NEGATIVE
PROTEIN UA: NEGATIVE
SPEC GRAV UA: 1.01
Urobilinogen, UA: 0.2
pH, UA: 6

## 2014-04-10 LAB — URIC ACID: URIC ACID, SERUM: 3.5 mg/dL (ref 2.4–7.0)

## 2014-04-10 LAB — BASIC METABOLIC PANEL
BUN: 17 mg/dL (ref 6–23)
CALCIUM: 10 mg/dL (ref 8.4–10.5)
CO2: 25 mEq/L (ref 19–32)
CREATININE: 1 mg/dL (ref 0.4–1.2)
Chloride: 101 mEq/L (ref 96–112)
GFR: 60.44 mL/min (ref >60.00)
Glucose, Bld: 123 mg/dL — ABNORMAL HIGH (ref 70–99)
Potassium: 3.8 mEq/L (ref 3.5–5.1)
Sodium: 136 mEq/L (ref 135–145)

## 2014-04-10 LAB — HEPATIC FUNCTION PANEL
ALBUMIN: 4.2 g/dL (ref 3.5–5.2)
ALT: 35 U/L (ref 0–35)
AST: 34 U/L (ref 0–37)
Alkaline Phosphatase: 72 U/L (ref 39–117)
BILIRUBIN TOTAL: 0.9 mg/dL (ref 0.2–1.2)
Bilirubin, Direct: 0.1 mg/dL (ref 0.0–0.3)
Total Protein: 6.9 g/dL (ref 6.0–8.3)

## 2014-04-10 LAB — LIPID PANEL
Cholesterol: 126 mg/dL (ref 0–200)
HDL: 39.6 mg/dL (ref >39.00)
LDL CALC: 51 mg/dL (ref 0–99)
NonHDL: 86.4
TRIGLYCERIDES: 177 mg/dL — AB (ref 0.0–149.0)
Total CHOL/HDL Ratio: 3
VLDL: 35.4 mg/dL (ref 0.0–40.0)

## 2014-04-10 LAB — HEMOGLOBIN A1C: HEMOGLOBIN A1C: 5.9 % (ref 4.6–6.5)

## 2014-04-10 MED ORDER — VALSARTAN-HYDROCHLOROTHIAZIDE 160-25 MG PO TABS: 1.0000 | ORAL_TABLET | Freq: Every morning | ORAL | Status: DC

## 2014-04-10 MED ORDER — DEXLANSOPRAZOLE 60 MG PO CPDR: 60.0000 mg | DELAYED_RELEASE_CAPSULE | Freq: Every morning | ORAL | Status: DC

## 2014-04-10 MED ORDER — GLUCOSE BLOOD VI STRP: ORAL_STRIP | Status: DC

## 2014-04-10 MED ORDER — ONETOUCH DELICA LANCETS FINE MISC: Status: DC

## 2014-04-10 NOTE — Addendum Note (Signed)
Addended by: Ewing Schlein on: 04/10/2014 02:31 PM   Modules accepted: Orders

## 2014-04-10 NOTE — Addendum Note (Signed)
Addended by: Modena Morrow D on: 04/10/2014 04:07 PM   Modules accepted: Orders

## 2014-04-10 NOTE — Progress Notes (Signed)
   Subjective:    Patient ID: Rachel Black, female    DOB: [birth year], 63 y.o.   MRN: [id number]  HPI    Review of Systems  Constitutional: Negative for diaphoresis, appetite change, fatigue and unexpected weight change.  Eyes: Negative for pain, redness and visual disturbance.  Respiratory: Negative for cough, chest tightness, shortness of breath and wheezing.   Cardiovascular: Negative for chest pain, palpitations and leg swelling.  Endocrine: Negative for cold intolerance, heat intolerance, polydipsia, polyphagia and polyuria.  Genitourinary: Negative for dysuria, frequency and difficulty urinating.  Neurological: Negative for dizziness, light-headedness, numbness and headaches.   HYPERTENSION  Blood pressure range-not checking  Chest pain- no      Dyspnea- no Lightheadedness- no   Edema- no Other side effects - no   Medication compliance: good Low salt diet- yes  DIABETES  Blood Sugar ranges- 110-135 Polyuria- no New Visual problems- no Hypoglycemic symptoms- no Other side effects-no Medication compliance - good Last eye exam- 03/26/2014 Foot exam- today  HYPERLIPIDEMIA  Medication compliance- good RUQ pain- no  Muscle aches- no Other side effects-no          Objective:   Physical Exam  Nursing note and vitals reviewed. Constitutional: She is oriented to person, place, and time. She appears well-developed and well-nourished.  HENT:  Head: Normocephalic and atraumatic.  Eyes: Conjunctivae and EOM are normal.  Neck: Normal range of motion. Neck supple. No JVD present. Carotid bruit is not present. No thyromegaly present.  Cardiovascular: Normal rate, regular rhythm and normal heart sounds.   No murmur heard. Pulmonary/Chest: Effort normal and breath sounds normal. No respiratory distress. She has no wheezes. She has no rales. She exhibits no tenderness.  Musculoskeletal: She exhibits no edema.  Neurological: She is alert and oriented to person, place, and  time.  Psychiatric: She has a normal mood and affect.   Filed Vitals:   04/10/14 1318  BP: 120/64  Pulse: 108  Temp: 98.7 F (37.1 C)  TempSrc: Oral  Weight: 319 lb (144.697 kg)  SpO2: 95%   Sensory exam of the foot is normal, tested with the monofilament. Good pulses, no lesions or ulcers, good peripheral pulses.         Assessment & Plan:  1. Type II or unspecified type diabetes mellitus with unspecified complication, uncontrolled Check labs metabolic panel - Hemoglobin A1c - Microalbumin / creatinine urine ratio - POCT urinalysis dipstick - ONETOUCH DELICA LANCETS FINE MISC; Check glucose qd  Dispense: 90 each; Refill: 3 - glucose blood test strip; Use as instructed  Dispense: 100 each; Refill: 12  2. Other and unspecified hyperlipidemia  - Hepatic function panel - Lipid panel  3. Essential hypertension Stab;e - Microalbumin / creatinine urine ratio - POCT urinalysis dipstick - valsartan-hydrochlorothiazide (DIOVAN-HCT) 160-25 MG per tablet; Take 1 tablet by mouth every morning.  Dispense: 90 tablet; Refill: 3  4. Gout of big toe  - Uric acid  5. Gastroesophageal reflux disease with esophagitis  - dexlansoprazole (DEXILANT) 60 MG capsule; Take 1 capsule (60 mg total) by mouth every morning.  Dispense: 90 capsule; Refill: 3

## 2014-04-10 NOTE — Progress Notes (Signed)
Pre visit review using our clinic review tool, if applicable. No additional management support is needed unless otherwise documented below in the visit note. 

## 2014-04-10 NOTE — Patient Instructions (Signed)
Diabetes and Standards of Medical Care Diabetes is complicated. You may find that your diabetes team includes a dietitian, nurse, diabetes educator, eye doctor, and more. To help everyone know what is going on and to help you get the care you deserve, the following schedule of care was developed to help keep you on track. Below are the tests, exams, vaccines, medicines, education, and plans you will need. HbA1c test This test shows how well you have controlled your glucose over the past 2-3 months. It is used to see if your diabetes management plan needs to be adjusted.   It is performed at least 2 times a year if you are meeting treatment goals.  It is performed 4 times a year if therapy has changed or if you are not meeting treatment goals. Blood pressure test  This test is performed at every routine medical visit. The goal is less than 140/90 mm Hg for most people, but 130/80 mm Hg in some cases. Ask your health care provider about your goal. Dental exam  Follow up with the dentist regularly. Eye exam  If you are diagnosed with type 1 diabetes as a child, get an exam upon reaching the age of 37 years or older and have had diabetes for 3-5 years. Yearly eye exams are recommended after that initial eye exam.  If you are diagnosed with type 1 diabetes as an adult, get an exam within 5 years of diagnosis and then yearly.  If you are diagnosed with type 2 diabetes, get an exam as soon as possible after the diagnosis and then yearly. Foot care exam  Visual foot exams are performed at every routine medical visit. The exams check for cuts, injuries, or other problems with the feet.  A comprehensive foot exam should be done yearly. This includes visual inspection as well as assessing foot pulses and testing for loss of sensation.  Check your feet nightly for cuts, injuries, or other problems with your feet. Tell your health care provider if anything is not healing. Kidney function test (urine  microalbumin)  This test is performed once a year.  Type 1 diabetes: The first test is performed 5 years after diagnosis.  Type 2 diabetes: The first test is performed at the time of diagnosis.  A serum creatinine and estimated glomerular filtration rate (eGFR) test is done once a year to assess the level of chronic kidney disease (CKD), if present. Lipid profile (cholesterol, HDL, LDL, triglycerides)  Performed every 5 years for most people.  The goal for LDL is less than 100 mg/dL. If you are at high risk, the goal is less than 70 mg/dL.  The goal for HDL is 40 mg/dL-50 mg/dL for men and 50 mg/dL-60 mg/dL for women. An HDL cholesterol of 60 mg/dL or higher gives some protection against heart disease.  The goal for triglycerides is less than 150 mg/dL. Influenza vaccine, pneumococcal vaccine, and hepatitis B vaccine  The influenza vaccine is recommended yearly.  It is recommended that people with diabetes who are over 24 years old get the pneumonia vaccine. In some cases, two separate shots may be given. Ask your health care provider if your pneumonia vaccination is up to date.  The hepatitis B vaccine is also recommended for adults with diabetes. Diabetes self-management education  Education is recommended at diagnosis and ongoing as needed. Treatment plan  Your treatment plan is reviewed at every medical visit. Document Released: 06/28/2009 Document Revised: 01/15/2014 Document Reviewed: 01/31/2013 [facility] Patient Information 2015 [place],  [employer]. This information is not intended to replace advice given to you by your health care provider. Make sure you discuss any questions you have with your health care provider.  

## 2014-04-11 LAB — MICROALBUMIN / CREATININE URINE RATIO
Creatinine,U: 145.4 mg/dL
Microalb Creat Ratio: 1.5 mg/g (ref 0.0–30.0)
Microalb, Ur: 2.2 mg/dL — ABNORMAL HIGH (ref 0.0–1.9)

## 2014-04-12 LAB — URINE CULTURE

## 2014-04-13 ENCOUNTER — Encounter (HOSPITAL_COMMUNITY)
Admission: RE | Admit: 2014-04-13 | Discharge: 2014-04-13 | Disposition: A | Discharge status: Home / Self Care | Payer: BC Managed Care – PPO | Source: Ambulatory Visit | Attending: Orthopaedic Surgery | Admitting: Orthopaedic Surgery

## 2014-04-13 ENCOUNTER — Ambulatory Visit (HOSPITAL_COMMUNITY)
Admission: RE | Admit: 2014-04-13 | Discharge: 2014-04-13 | Disposition: A | Discharge status: Home / Self Care | Payer: BC Managed Care – PPO | Source: Ambulatory Visit | Attending: Anesthesiology | Admitting: Anesthesiology

## 2014-04-13 ENCOUNTER — Encounter (HOSPITAL_COMMUNITY): Payer: Self-pay

## 2014-04-13 DIAGNOSIS — I10 Essential (primary) hypertension: Secondary | ICD-10-CM | POA: Diagnosis not present

## 2014-04-13 DIAGNOSIS — Z01818 Encounter for other preprocedural examination: Secondary | ICD-10-CM | POA: Insufficient documentation

## 2014-04-13 HISTORY — DX: Personal history of other diseases of the circulatory system: Z86.79

## 2014-04-13 LAB — URINE MICROSCOPIC-ADD ON

## 2014-04-13 LAB — URINALYSIS, ROUTINE W REFLEX MICROSCOPIC
BILIRUBIN URINE: NEGATIVE
Glucose, UA: NEGATIVE mg/dL
Hgb urine dipstick: NEGATIVE
KETONES UR: NEGATIVE mg/dL
NITRITE: NEGATIVE
PH: 5.5 (ref 5.0–8.0)
PROTEIN: NEGATIVE mg/dL
Specific Gravity, Urine: 1.014 (ref 1.005–1.030)
Urobilinogen, UA: 0.2 mg/dL (ref 0.0–1.0)

## 2014-04-13 LAB — BASIC METABOLIC PANEL
Anion gap: 13 (ref 5–15)
BUN: 19 mg/dL (ref 6–23)
CO2: 27 mEq/L (ref 19–32)
Calcium: 10.4 mg/dL (ref 8.4–10.5)
Chloride: 100 mEq/L (ref 96–112)
Creatinine, Ser: 1.01 mg/dL (ref 0.50–1.10)
GFR calc Af Amer: 68 mL/min — ABNORMAL LOW (ref >90)
GFR calc non Af Amer: 59 mL/min — ABNORMAL LOW (ref >90)
Glucose, Bld: 131 mg/dL — ABNORMAL HIGH (ref 70–99)
Potassium: 4.6 mEq/L (ref 3.7–5.3)
Sodium: 140 mEq/L (ref 137–147)

## 2014-04-13 LAB — PROTIME-INR
INR: 0.97 (ref 0.00–1.49)
Prothrombin Time: 12.9 seconds (ref 11.6–15.2)

## 2014-04-13 LAB — CBC
HCT: 38.1 % (ref 36.0–46.0)
Hemoglobin: 12.6 g/dL (ref 12.0–15.0)
MCH: 30.4 pg (ref 26.0–34.0)
MCHC: 33.1 g/dL (ref 30.0–36.0)
MCV: 91.8 fL (ref 78.0–100.0)
PLATELETS: 155 10*3/uL (ref 150–400)
RBC: 4.15 MIL/uL (ref 3.87–5.11)
RDW: 13.7 % (ref 11.5–15.5)
WBC: 6.1 10*3/uL (ref 4.0–10.5)

## 2014-04-13 LAB — ABO/RH: ABO/RH(D): A POS

## 2014-04-13 LAB — SURGICAL PCR SCREEN
MRSA, PCR: NEGATIVE
Staphylococcus aureus: NEGATIVE

## 2014-04-13 LAB — APTT: aPTT: 31 seconds (ref 24–37)

## 2014-04-13 NOTE — Patient Instructions (Signed)
YOUR SURGERY IS SCHEDULED AT [facility]  ON:  Friday  8/7  REPORT TO  SHORT STAY CENTER AT:  12:15 PM   PLEASE COME IN THE [place] ENTRANCE AND FOLLOW SIGNS TO SHORT STAY CENTER.  DO NOT EAT ANYTHING AFTER MIDNIGHT THE NIGHT BEFORE YOUR SURGERY.   NO FOOD, NO CHEWING GUM, NO MINTS, NO CANDIES, NO CHEWING TOBACCO. YOU MAY HAVE CLEAR LIQUIDS TO DRINK FROM MIDNIGHT NIGHT BEFORE SURGERY - UNTIL 8:15 AM DAY OF SURGERY - LIKE WATER, SODA.   NOTHING TO DRINK AFTER 8:15 AM DAY OF SURGERY.  PLEASE TAKE THE FOLLOWING MEDICATIONS THE AM OF YOUR SURGERY WITH A FEW SIPS OF WATER:   ALLOPURINOL AND DEXLANSOPRAZOLE.   IF YOU ARE DIABETIC:  DO NOT TAKE ANY DIABETIC MEDICATIONS THE AM OF YOUR SURGERY.    DO NOT BRING VALUABLES, MONEY, CREDIT CARDS.  DO NOT WEAR JEWELRY, MAKE-UP, NAIL POLISH AND NO METAL PINS OR CLIPS IN YOUR HAIR. CONTACT LENS, DENTURES / PARTIALS, GLASSES SHOULD NOT BE WORN TO SURGERY AND IN MOST CASES-HEARING AIDS WILL NEED TO BE REMOVED.  BRING YOUR GLASSES CASE, ANY EQUIPMENT NEEDED FOR YOUR CONTACT LENS. FOR PATIENTS ADMITTED TO THE HOSPITAL--CHECK OUT TIME THE DAY OF DISCHARGE IS 11:00 AM.  ALL INPATIENT ROOMS ARE PRIVATE - WITH BATHROOM, TELEPHONE, TELEVISION AND WIFI INTERNET.                                                    PLEASE READ OVER ANY  FACT SHEETS THAT YOU WERE GIVEN: MRSA INFORMATION, BLOOD TRANSFUSION INFORMATION, INCENTIVE SPIROMETER INFORMATION.  PLEASE BE AWARE THAT YOU MAY NEED ADDITIONAL BLOOD DRAWN DAY OF YOUR SURGERY  _______________________________________________________________________   [facility] - Preparing for Surgery Before surgery, you can play an important role.  Because skin is not sterile, your skin needs to be as free of germs as possible.  You can reduce the number of germs on your skin by washing with CHG (chlorahexidine gluconate) soap before surgery.  CHG is an antiseptic cleaner which kills germs and bonds  with the skin to continue killing germs even after washing. Please DO NOT use if you have an allergy to CHG or antibacterial soaps.  If your skin becomes reddened/irritated stop using the CHG and inform your nurse when you arrive at Short Stay. Do not shave (including legs and underarms) for at least 48 hours prior to the first CHG shower.  You may shave your face/neck. Please follow these instructions carefully:  1.  Shower with CHG Soap the night before surgery and the  morning of Surgery.  2.  If you choose to wash your hair, wash your hair first as usual with your  normal  shampoo.  3.  After you shampoo, rinse your hair and body thoroughly to remove the  shampoo.                           4.  Use CHG as you would any other liquid soap.  You can apply chg directly  to the skin and wash                       Gently with a scrungie or clean washcloth.  5.  Apply the CHG Soap to  your body ONLY FROM THE NECK DOWN.   Do not use on face/ open                           Wound or open sores. Avoid contact with eyes, ears mouth and genitals (private parts).                       Wash face,  Genitals (private parts) with your normal soap.             6.  Wash thoroughly, paying special attention to the area where your surgery  will be performed.  7.  Thoroughly rinse your body with warm water from the neck down.  8.  DO NOT shower/wash with your normal soap after using and rinsing off  the CHG Soap.                9.  Pat yourself dry with a clean towel.            10.  Wear clean pajamas.            11.  Place clean sheets on your bed the night of your first shower and do not  sleep with pets. Day of Surgery : Do not apply any lotions/deodorants the morning of surgery.  Please wear clean clothes to the hospital/surgery center.  FAILURE TO FOLLOW THESE INSTRUCTIONS MAY RESULT IN THE CANCELLATION OF YOUR SURGERY PATIENT SIGNATURE_________________________________  NURSE  SIGNATURE__________________________________  ________________________________________________________________________  WHAT IS A BLOOD TRANSFUSION? Blood Transfusion Information  A transfusion is the replacement of blood or some of its parts. Blood is made up of multiple cells which provide different functions.  Red blood cells carry oxygen and are used for blood loss replacement.  White blood cells fight against infection.  Platelets control bleeding.  Plasma helps clot blood.  Other blood products are available for specialized needs, such as hemophilia or other clotting disorders. BEFORE THE TRANSFUSION  Who gives blood for transfusions?   Healthy volunteers who are fully evaluated to make sure their blood is safe. This is blood bank blood. Transfusion therapy is the safest it has ever been in the practice of medicine. Before blood is taken from a donor, a complete history is taken to make sure that person has no history of diseases nor engages in risky social behavior (examples are intravenous drug use or sexual activity with multiple partners). The donor's travel history is screened to minimize risk of transmitting infections, such as malaria. The donated blood is tested for signs of infectious diseases, such as HIV and hepatitis. The blood is then tested to be sure it is compatible with you in order to minimize the chance of a transfusion reaction. If you or a relative donates blood, this is often done in anticipation of surgery and is not appropriate for emergency situations. It takes many days to process the donated blood. RISKS AND COMPLICATIONS Although transfusion therapy is very safe and saves many lives, the main dangers of transfusion include:   Getting an infectious disease.  Developing a transfusion reaction. This is an allergic reaction to something in the blood you were given. Every precaution is taken to prevent this. The decision to have a blood transfusion has been  considered carefully by your caregiver before blood is given. Blood is not given unless the benefits outweigh the risks. AFTER THE TRANSFUSION  Right after receiving a blood transfusion, you will  usually feel much better and more energetic. This is especially true if your red blood cells have gotten low (anemic). The transfusion raises the level of the red blood cells which carry oxygen, and this usually causes an energy increase.  The nurse administering the transfusion will monitor you carefully for complications. HOME CARE INSTRUCTIONS  No special instructions are needed after a transfusion. You may find your energy is better. Speak with your caregiver about any limitations on activity for underlying diseases you may have. SEEK MEDICAL CARE IF:   Your condition is not improving after your transfusion.  You develop redness or irritation at the intravenous (IV) site. SEEK IMMEDIATE MEDICAL CARE IF:  Any of the following symptoms occur over the next 12 hours:  Shaking chills.  You have a temperature by mouth above 102 F (38.9 C), not controlled by medicine.  Chest, back, or muscle pain.  People around you feel you are not acting correctly or are confused.  Shortness of breath or difficulty breathing.  Dizziness and fainting.  You get a rash or develop hives.  You have a decrease in urine output.  Your urine turns a dark color or changes to pink, red, or brown. Any of the following symptoms occur over the next 10 days:  You have a temperature by mouth above 102 F (38.9 C), not controlled by medicine.  Shortness of breath.  Weakness after normal activity.  The white part of the eye turns yellow (jaundice).  You have a decrease in the amount of urine or are urinating less often.  Your urine turns a dark color or changes to pink, red, or brown. Document Released: 08/28/2000 Document Revised: 11/23/2011 Document Reviewed: 04/16/2008 [facility] Patient Information 2014  [place], [place].  _______________________________________________________________________

## 2014-04-13 NOTE — Progress Notes (Signed)
04/13/14 1430  OBSTRUCTIVE SLEEP APNEA  Have you ever been diagnosed with sleep apnea through a sleep study? No  Do you snore loudly (loud enough to be heard through closed doors)?  1  Do you often feel tired, fatigued, or sleepy during the daytime? 1  Has anyone observed you stop breathing during your sleep? 0  Do you have, or are you being treated for high blood pressure? 1  BMI more than 35 kg/m2? 1  Age over 62 years old? 1  Neck circumference greater than 40 cm/16 inches? 1  Gender: 0  Obstructive Sleep Apnea Score 6  Score 4 or greater  Results sent to PCP

## 2014-04-13 NOTE — Pre-Procedure Instructions (Signed)
EKG AND CXR WERE DONE TODAY - PREOP AT [facility]. 

## 2014-04-16 ENCOUNTER — Telehealth: Payer: Self-pay | Admitting: Family Medicine

## 2014-04-16 NOTE — Telephone Encounter (Signed)
Can be done after surgery

## 2014-04-16 NOTE — Telephone Encounter (Signed)
Discussed labs with patient and she voiced understanding, she stated she would not be bale to pick up the 25 hour urine container due to her upcoming surgical procedure. I encouraged her to have her husband come by to pick it up, she said she is unsure when she would be able to complete but will do it when she can, Copy mailed.     KP

## 2014-04-16 NOTE — Telephone Encounter (Signed)
Caller name: Waneta  Call back number:[phone]   Reason for call:  Pt returned call.  -please call back

## 2014-04-16 NOTE — Telephone Encounter (Signed)
Notes Recorded by Rosalita Chessman, DO on 04/12/2014 at 12:29 PM Contaminated-- recheck if symptomatic Notes Recorded by Rosalita Chessman, DO on 04/11/2014 at 1:49 PM TG elevated-- Cholesterol--- LDL goal < 70, HDL >40, TG < 150. Diet and exercise will increase HDL and decrease LDL and TG. Fish, Fish Oil, Flaxseed oil will also help increase the HDL and decrease Triglycerides. Recheck labs in 3 months.------ 272.4 lipid, hep Dm controlled-- con't meds---250.00 Hgba1c, bmp Elevated microalbumin--- check 24 h urine for protein

## 2014-04-20 ENCOUNTER — Inpatient Hospital Stay (HOSPITAL_COMMUNITY)
Admission: RE | Admit: 2014-04-20 | Discharge: 2014-04-25 | DRG: 470 | Disposition: A | Discharge status: Skilled Nursing Facility | Payer: BC Managed Care – PPO | Source: Ambulatory Visit | Attending: Orthopaedic Surgery | Admitting: Orthopaedic Surgery

## 2014-04-20 ENCOUNTER — Inpatient Hospital Stay (HOSPITAL_COMMUNITY): Payer: BC Managed Care – PPO

## 2014-04-20 ENCOUNTER — Encounter (HOSPITAL_COMMUNITY): Payer: Self-pay | Admitting: *Deleted

## 2014-04-20 ENCOUNTER — Encounter (HOSPITAL_COMMUNITY): Payer: BC Managed Care – PPO | Admitting: Certified Registered"

## 2014-04-20 ENCOUNTER — Encounter (HOSPITAL_COMMUNITY)
Admission: RE | Disposition: A | Discharge status: Skilled Nursing Facility | Payer: Self-pay | Source: Ambulatory Visit | Attending: Orthopaedic Surgery

## 2014-04-20 ENCOUNTER — Inpatient Hospital Stay (HOSPITAL_COMMUNITY): Payer: BC Managed Care – PPO | Admitting: Certified Registered"

## 2014-04-20 DIAGNOSIS — Z823 Family history of stroke: Secondary | ICD-10-CM | POA: Diagnosis not present

## 2014-04-20 DIAGNOSIS — M898X9 Other specified disorders of bone, unspecified site: Secondary | ICD-10-CM | POA: Diagnosis present

## 2014-04-20 DIAGNOSIS — Z880 Allergy status to penicillin: Secondary | ICD-10-CM

## 2014-04-20 DIAGNOSIS — Z833 Family history of diabetes mellitus: Secondary | ICD-10-CM

## 2014-04-20 DIAGNOSIS — Z8249 Family history of ischemic heart disease and other diseases of the circulatory system: Secondary | ICD-10-CM

## 2014-04-20 DIAGNOSIS — M25469 Effusion, unspecified knee: Secondary | ICD-10-CM | POA: Diagnosis present

## 2014-04-20 DIAGNOSIS — Z886 Allergy status to analgesic agent status: Secondary | ICD-10-CM | POA: Diagnosis not present

## 2014-04-20 DIAGNOSIS — M109 Gout, unspecified: Secondary | ICD-10-CM | POA: Diagnosis present

## 2014-04-20 DIAGNOSIS — D62 Acute posthemorrhagic anemia: Secondary | ICD-10-CM | POA: Diagnosis not present

## 2014-04-20 DIAGNOSIS — Z6841 Body Mass Index (BMI) 40.0 and over, adult: Secondary | ICD-10-CM | POA: Diagnosis not present

## 2014-04-20 DIAGNOSIS — I10 Essential (primary) hypertension: Secondary | ICD-10-CM | POA: Diagnosis present

## 2014-04-20 DIAGNOSIS — Z96651 Presence of right artificial knee joint: Secondary | ICD-10-CM

## 2014-04-20 DIAGNOSIS — E119 Type 2 diabetes mellitus without complications: Secondary | ICD-10-CM | POA: Diagnosis present

## 2014-04-20 DIAGNOSIS — M1710 Unilateral primary osteoarthritis, unspecified knee: Secondary | ICD-10-CM | POA: Diagnosis present

## 2014-04-20 DIAGNOSIS — K219 Gastro-esophageal reflux disease without esophagitis: Secondary | ICD-10-CM | POA: Diagnosis present

## 2014-04-20 DIAGNOSIS — E785 Hyperlipidemia, unspecified: Secondary | ICD-10-CM | POA: Diagnosis present

## 2014-04-20 DIAGNOSIS — Z9109 Other allergy status, other than to drugs and biological substances: Secondary | ICD-10-CM

## 2014-04-20 DIAGNOSIS — M1711 Unilateral primary osteoarthritis, right knee: Secondary | ICD-10-CM

## 2014-04-20 DIAGNOSIS — M25569 Pain in unspecified knee: Secondary | ICD-10-CM | POA: Diagnosis present

## 2014-04-20 HISTORY — PX: TOTAL KNEE ARTHROPLASTY: SHX125

## 2014-04-20 LAB — GLUCOSE, CAPILLARY
GLUCOSE-CAPILLARY: 134 mg/dL — AB (ref 70–99)
GLUCOSE-CAPILLARY: 130 mg/dL — AB (ref 70–99)
Glucose-Capillary: 125 mg/dL — ABNORMAL HIGH (ref 70–99)
Glucose-Capillary: 100 mg/dL — ABNORMAL HIGH (ref 70–99)
Glucose-Capillary: 126 mg/dL — ABNORMAL HIGH (ref 70–99)

## 2014-04-20 SURGERY — ARTHROPLASTY, KNEE, TOTAL
Anesthesia: Spinal | Site: Knee | Laterality: Right

## 2014-04-20 MED ORDER — HYDROMORPHONE HCL PF 1 MG/ML IJ SOLN
0.2500 mg | INTRAMUSCULAR | Status: AC | PRN
  Administered 2014-04-20 (×5): 0.5 mg via INTRAVENOUS
  Administered 2014-04-20: 0.25 mg via INTRAVENOUS
  Administered 2014-04-20 (×2): 0.5 mg via INTRAVENOUS

## 2014-04-20 MED ORDER — LIDOCAINE HCL (CARDIAC) 20 MG/ML IV SOLN
INTRAVENOUS | Status: DC | PRN
  Administered 2014-04-20: 20 mg via INTRAVENOUS

## 2014-04-20 MED ORDER — LINAGLIPTIN 5 MG PO TABS
5.0000 mg | ORAL_TABLET | Freq: Every day | ORAL | Status: DC
  Administered 2014-04-20 – 2014-04-24 (×5): 5 mg via ORAL
  Filled 2014-04-20 (×6): qty 1

## 2014-04-20 MED ORDER — PHENOL 1.4 % MT LIQD
1.0000 | OROMUCOSAL | Status: DC | PRN
  Filled 2014-04-20: qty 177

## 2014-04-20 MED ORDER — PROPOFOL 10 MG/ML IV BOLUS
INTRAVENOUS | Status: AC
  Filled 2014-04-20: qty 20

## 2014-04-20 MED ORDER — ONDANSETRON HCL 4 MG PO TABS
4.0000 mg | ORAL_TABLET | Freq: Four times a day (QID) | ORAL | Status: DC | PRN
  Administered 2014-04-23 – 2014-04-25 (×3): 4 mg via ORAL
  Filled 2014-04-20 (×3): qty 1

## 2014-04-20 MED ORDER — ONDANSETRON HCL 4 MG/2ML IJ SOLN
INTRAMUSCULAR | Status: AC
  Filled 2014-04-20: qty 2

## 2014-04-20 MED ORDER — IRBESARTAN 150 MG PO TABS
150.0000 mg | ORAL_TABLET | Freq: Every day | ORAL | Status: DC
  Administered 2014-04-21 – 2014-04-25 (×5): 150 mg via ORAL
  Filled 2014-04-20 (×5): qty 1

## 2014-04-20 MED ORDER — HYDROMORPHONE HCL PF 1 MG/ML IJ SOLN
INTRAMUSCULAR | Status: AC
  Filled 2014-04-20: qty 1

## 2014-04-20 MED ORDER — FENTANYL CITRATE 0.05 MG/ML IJ SOLN
INTRAMUSCULAR | Status: AC
  Filled 2014-04-20: qty 2

## 2014-04-20 MED ORDER — ACETAMINOPHEN 650 MG RE SUPP: 650.0000 mg | Freq: Four times a day (QID) | RECTAL | Status: DC | PRN

## 2014-04-20 MED ORDER — LACTATED RINGERS IV SOLN
INTRAVENOUS | Status: DC
  Administered 2014-04-20: 1000 mL via INTRAVENOUS

## 2014-04-20 MED ORDER — METFORMIN HCL ER 500 MG PO TB24
1000.0000 mg | ORAL_TABLET | Freq: Every day | ORAL | Status: DC
  Administered 2014-04-20 – 2014-04-23 (×4): 1000 mg via ORAL
  Filled 2014-04-20 (×6): qty 2

## 2014-04-20 MED ORDER — GLYCOPYRROLATE 0.2 MG/ML IJ SOLN
INTRAMUSCULAR | Status: DC | PRN
  Administered 2014-04-20: 0.2 mg via INTRAVENOUS

## 2014-04-20 MED ORDER — PROPOFOL INFUSION 10 MG/ML OPTIME
INTRAVENOUS | Status: DC | PRN
  Administered 2014-04-20: 75 ug/kg/min via INTRAVENOUS

## 2014-04-20 MED ORDER — BUPIVACAINE IN DEXTROSE 0.75-8.25 % IT SOLN
INTRATHECAL | Status: DC | PRN
  Administered 2014-04-20: 2 mL via INTRATHECAL

## 2014-04-20 MED ORDER — FENTANYL CITRATE 0.05 MG/ML IJ SOLN
INTRAMUSCULAR | Status: DC | PRN
  Administered 2014-04-20 (×2): 50 ug via INTRAVENOUS

## 2014-04-20 MED ORDER — MIDAZOLAM HCL 5 MG/5ML IJ SOLN
INTRAMUSCULAR | Status: DC | PRN
  Administered 2014-04-20: 2 mg via INTRAVENOUS

## 2014-04-20 MED ORDER — RIVAROXABAN 10 MG PO TABS
10.0000 mg | ORAL_TABLET | Freq: Every day | ORAL | Status: DC
  Administered 2014-04-21 – 2014-04-25 (×5): 10 mg via ORAL
  Filled 2014-04-20 (×6): qty 1

## 2014-04-20 MED ORDER — DOCUSATE SODIUM 100 MG PO CAPS
100.0000 mg | ORAL_CAPSULE | Freq: Two times a day (BID) | ORAL | Status: DC
  Administered 2014-04-20 – 2014-04-25 (×9): 100 mg via ORAL

## 2014-04-20 MED ORDER — POLYETHYLENE GLYCOL 3350 17 G PO PACK
17.0000 g | PACK | Freq: Every day | ORAL | Status: DC | PRN
  Administered 2014-04-21 – 2014-04-23 (×2): 17 g via ORAL

## 2014-04-20 MED ORDER — PHENYLEPHRINE HCL 10 MG/ML IJ SOLN
INTRAMUSCULAR | Status: AC
  Filled 2014-04-20: qty 2

## 2014-04-20 MED ORDER — VALSARTAN-HYDROCHLOROTHIAZIDE 160-25 MG PO TABS: 1.0000 | ORAL_TABLET | Freq: Every morning | ORAL | Status: DC

## 2014-04-20 MED ORDER — METHOCARBAMOL 1000 MG/10ML IJ SOLN
500.0000 mg | Freq: Four times a day (QID) | INTRAVENOUS | Status: DC | PRN
  Administered 2014-04-20 – 2014-04-21 (×2): 500 mg via INTRAVENOUS
  Filled 2014-04-20 (×2): qty 5

## 2014-04-20 MED ORDER — METHOCARBAMOL 500 MG PO TABS
500.0000 mg | ORAL_TABLET | Freq: Four times a day (QID) | ORAL | Status: DC | PRN
  Administered 2014-04-21 – 2014-04-25 (×5): 500 mg via ORAL
  Filled 2014-04-20 (×5): qty 1

## 2014-04-20 MED ORDER — OXYCODONE HCL 5 MG PO TABS
5.0000 mg | ORAL_TABLET | ORAL | Status: DC | PRN
  Administered 2014-04-20 (×2): 5 mg via ORAL
  Administered 2014-04-21 – 2014-04-24 (×11): 10 mg via ORAL
  Administered 2014-04-25: 5 mg via ORAL
  Administered 2014-04-25: 10 mg via ORAL
  Administered 2014-04-25: 5 mg via ORAL
  Administered 2014-04-25: 10 mg via ORAL
  Filled 2014-04-20 (×4): qty 2
  Filled 2014-04-20: qty 1
  Filled 2014-04-20 (×4): qty 2
  Filled 2014-04-20: qty 1
  Filled 2014-04-20: qty 2
  Filled 2014-04-20 (×2): qty 1
  Filled 2014-04-20 (×5): qty 2

## 2014-04-20 MED ORDER — LIDOCAINE HCL (CARDIAC) 20 MG/ML IV SOLN
INTRAVENOUS | Status: DC | PRN
  Administered 2014-04-20: 50 mg via INTRAVENOUS

## 2014-04-20 MED ORDER — CLINDAMYCIN PHOSPHATE 900 MG/50ML IV SOLN
900.0000 mg | INTRAVENOUS | Status: AC
  Administered 2014-04-20: 900 mg via INTRAVENOUS

## 2014-04-20 MED ORDER — PHENYLEPHRINE HCL 10 MG/ML IJ SOLN
20.0000 mg | INTRAVENOUS | Status: DC | PRN
  Administered 2014-04-20: 10 ug/min via INTRAVENOUS

## 2014-04-20 MED ORDER — PHENYLEPHRINE HCL 10 MG/ML IJ SOLN
INTRAMUSCULAR | Status: DC | PRN
  Administered 2014-04-20: 80 ug via INTRAVENOUS
  Administered 2014-04-20: 120 ug via INTRAVENOUS

## 2014-04-20 MED ORDER — SITAGLIP PHOS-METFORMIN HCL ER 100-1000 MG PO TB24: 1.0000 | ORAL_TABLET | Freq: Every day | ORAL | Status: DC

## 2014-04-20 MED ORDER — SODIUM CHLORIDE 0.9 % IR SOLN
Status: DC | PRN
  Administered 2014-04-20: 2000 mL

## 2014-04-20 MED ORDER — PHENYLEPHRINE 40 MCG/ML (10ML) SYRINGE FOR IV PUSH (FOR BLOOD PRESSURE SUPPORT)
PREFILLED_SYRINGE | INTRAVENOUS | Status: AC
  Filled 2014-04-20: qty 10

## 2014-04-20 MED ORDER — PANTOPRAZOLE SODIUM 40 MG PO TBEC
40.0000 mg | DELAYED_RELEASE_TABLET | Freq: Every day | ORAL | Status: DC
  Administered 2014-04-21 – 2014-04-25 (×5): 40 mg via ORAL
  Filled 2014-04-20 (×5): qty 1

## 2014-04-20 MED ORDER — SODIUM CHLORIDE 0.9 % IV SOLN
INTRAVENOUS | Status: DC
  Administered 2014-04-20: 21:00:00 via INTRAVENOUS

## 2014-04-20 MED ORDER — PROPOFOL 10 MG/ML IV BOLUS
INTRAVENOUS | Status: DC | PRN
  Administered 2014-04-20: 20 mg via INTRAVENOUS

## 2014-04-20 MED ORDER — DIPHENHYDRAMINE HCL 12.5 MG/5ML PO ELIX: 12.5000 mg | ORAL_SOLUTION | ORAL | Status: DC | PRN

## 2014-04-20 MED ORDER — ZOLPIDEM TARTRATE 5 MG PO TABS: 5.0000 mg | ORAL_TABLET | Freq: Every evening | ORAL | Status: DC | PRN

## 2014-04-20 MED ORDER — HYDROCHLOROTHIAZIDE 25 MG PO TABS
25.0000 mg | ORAL_TABLET | Freq: Every day | ORAL | Status: DC
  Administered 2014-04-21 – 2014-04-25 (×5): 25 mg via ORAL
  Filled 2014-04-20 (×5): qty 1

## 2014-04-20 MED ORDER — ONDANSETRON HCL 4 MG/2ML IJ SOLN
4.0000 mg | Freq: Four times a day (QID) | INTRAMUSCULAR | Status: DC | PRN
  Administered 2014-04-21: 4 mg via INTRAVENOUS
  Filled 2014-04-20: qty 2

## 2014-04-20 MED ORDER — METOCLOPRAMIDE HCL 5 MG/ML IJ SOLN: 5.0000 mg | Freq: Three times a day (TID) | INTRAMUSCULAR | Status: DC | PRN

## 2014-04-20 MED ORDER — MIDAZOLAM HCL 2 MG/2ML IJ SOLN
INTRAMUSCULAR | Status: AC
  Filled 2014-04-20: qty 2

## 2014-04-20 MED ORDER — MENTHOL 3 MG MT LOZG
1.0000 | LOZENGE | OROMUCOSAL | Status: DC | PRN
  Filled 2014-04-20: qty 9

## 2014-04-20 MED ORDER — CLINDAMYCIN PHOSPHATE 600 MG/50ML IV SOLN
600.0000 mg | Freq: Four times a day (QID) | INTRAVENOUS | Status: AC
  Administered 2014-04-20 – 2014-04-21 (×2): 600 mg via INTRAVENOUS
  Filled 2014-04-20 (×2): qty 50

## 2014-04-20 MED ORDER — ACETAMINOPHEN 325 MG PO TABS
650.0000 mg | ORAL_TABLET | Freq: Four times a day (QID) | ORAL | Status: DC | PRN
  Administered 2014-04-21 – 2014-04-24 (×4): 650 mg via ORAL
  Filled 2014-04-20 (×5): qty 2

## 2014-04-20 MED ORDER — ALLOPURINOL 300 MG PO TABS
300.0000 mg | ORAL_TABLET | Freq: Two times a day (BID) | ORAL | Status: DC
  Administered 2014-04-20 – 2014-04-25 (×10): 300 mg via ORAL
  Filled 2014-04-20 (×11): qty 1

## 2014-04-20 MED ORDER — ALUM & MAG HYDROXIDE-SIMETH 200-200-20 MG/5ML PO SUSP: 30.0000 mL | ORAL | Status: DC | PRN

## 2014-04-20 MED ORDER — METOCLOPRAMIDE HCL 10 MG PO TABS: 5.0000 mg | ORAL_TABLET | Freq: Three times a day (TID) | ORAL | Status: DC | PRN

## 2014-04-20 MED ORDER — PROMETHAZINE HCL 25 MG/ML IJ SOLN: 6.2500 mg | INTRAMUSCULAR | Status: DC | PRN

## 2014-04-20 MED ORDER — ASPIRIN EC 81 MG PO TBEC
81.0000 mg | DELAYED_RELEASE_TABLET | Freq: Every day | ORAL | Status: DC
  Administered 2014-04-21 – 2014-04-25 (×5): 81 mg via ORAL
  Filled 2014-04-20 (×6): qty 1

## 2014-04-20 MED ORDER — HYDROMORPHONE HCL PF 1 MG/ML IJ SOLN
1.0000 mg | INTRAMUSCULAR | Status: DC | PRN
  Administered 2014-04-20 – 2014-04-22 (×5): 1 mg via INTRAVENOUS
  Filled 2014-04-20 (×5): qty 1

## 2014-04-20 MED ORDER — CLINDAMYCIN PHOSPHATE 900 MG/50ML IV SOLN
INTRAVENOUS | Status: AC
  Filled 2014-04-20: qty 50

## 2014-04-20 MED ORDER — HYDROMORPHONE HCL PF 1 MG/ML IJ SOLN
0.2500 mg | INTRAMUSCULAR | Status: DC | PRN
Start: 2014-04-20 — End: 2014-04-20

## 2014-04-20 MED ORDER — LACTATED RINGERS IV SOLN
INTRAVENOUS | Status: DC
  Administered 2014-04-20 (×4): via INTRAVENOUS

## 2014-04-20 MED ORDER — ONDANSETRON HCL 4 MG/2ML IJ SOLN
INTRAMUSCULAR | Status: DC | PRN
  Administered 2014-04-20: 4 mg via INTRAVENOUS

## 2014-04-20 SURGICAL SUPPLY — 71 items
BAG ZIPLOCK 12X15 (MISCELLANEOUS) IMPLANT
BANDAGE ELASTIC 6 VELCRO ST LF (GAUZE/BANDAGES/DRESSINGS) ×4 IMPLANT
BANDAGE ESMARK 6X9 LF (GAUZE/BANDAGES/DRESSINGS) ×1 IMPLANT
BENZOIN TINCTURE PRP APPL 2/3 (GAUZE/BANDAGES/DRESSINGS) IMPLANT
BLADE SAG 13.0X1.37X90 (BLADE) ×2 IMPLANT
BLADE SAGITTAL 25.0X1.37X90 (BLADE) ×2 IMPLANT
BNDG ESMARK 6X9 LF (GAUZE/BANDAGES/DRESSINGS) ×2
BOWL SMART MIX CTS (DISPOSABLE) IMPLANT
CEMENT BONE 1-PACK (Cement) ×6 IMPLANT
CEMENT RESTRICTOR DEPUY SZ 4 (Cement) ×2 IMPLANT
COUPLER LEGION OFFSET 4MM KNEE (Orthopedic Implant) ×2 IMPLANT
CUFF TOURN SGL QUICK 44 (TOURNIQUET CUFF) ×2 IMPLANT
DRAPE EXTREMITY T 121X128X90 (DRAPE) ×2 IMPLANT
DRAPE LG THREE QUARTER DISP (DRAPES) IMPLANT
DRAPE POUCH INSTRU U-SHP 10X18 (DRAPES) ×2 IMPLANT
DRAPE U-SHAPE 47X51 STRL (DRAPES) ×2 IMPLANT
DRSG AQUACEL AG ADV 3.5X10 (GAUZE/BANDAGES/DRESSINGS) IMPLANT
DRSG TEGADERM 4X4.75 (GAUZE/BANDAGES/DRESSINGS) IMPLANT
DURAPREP 26ML APPLICATOR (WOUND CARE) ×2 IMPLANT
ELECT REM PT RETURN 9FT ADLT (ELECTROSURGICAL) ×2
ELECTRODE REM PT RTRN 9FT ADLT (ELECTROSURGICAL) ×1 IMPLANT
EVACUATOR 1/8 PVC DRAIN (DRAIN) ×2 IMPLANT
FACESHIELD WRAPAROUND (MASK) ×14 IMPLANT
FEM COMP OXINIUM RIGHT SZ 4 (Knees) ×2 IMPLANT
FEMORAL COMP OXINIUM RT KN SZ4 (Knees) ×1 IMPLANT
FEMORAL GENESIS PRIM LUGS (Orthopedic Implant) ×2 IMPLANT
GAUZE SPONGE 2X2 8PLY STRL LF (GAUZE/BANDAGES/DRESSINGS) IMPLANT
GAUZE SPONGE 4X4 12PLY STRL (GAUZE/BANDAGES/DRESSINGS) ×2 IMPLANT
GAUZE XEROFORM 1X8 LF (GAUZE/BANDAGES/DRESSINGS) IMPLANT
GAUZE XEROFORM 5X9 LF (GAUZE/BANDAGES/DRESSINGS) ×2 IMPLANT
GLOVE BIO SURGEON STRL SZ7.5 (GLOVE) ×2 IMPLANT
GLOVE BIOGEL PI IND STRL 8 (GLOVE) ×2 IMPLANT
GLOVE BIOGEL PI INDICATOR 8 (GLOVE) ×2
GLOVE ECLIPSE 8.0 STRL XLNG CF (GLOVE) ×2 IMPLANT
GOWN STRL REUS W/TWL XL LVL3 (GOWN DISPOSABLE) ×4 IMPLANT
HANDPIECE INTERPULSE COAX TIP (DISPOSABLE) ×2
IMMOBILIZER KNEE 20 (SOFTGOODS)
IMMOBILIZER KNEE 20 THIGH 36 (SOFTGOODS) IMPLANT
IMMOBILIZER KNEE 22 (SOFTGOODS) ×2 IMPLANT
INSERT SIZE 3-4 15MM KNEE (Insert) ×2 IMPLANT
INSERT SPEED PIN RIMMED 45MM (Knees) ×6 IMPLANT
KIT BASIN OR (CUSTOM PROCEDURE TRAY) ×2 IMPLANT
NEEDLE HYPO 21X1.5 SAFETY (NEEDLE) IMPLANT
PACK TOTAL JOINT (CUSTOM PROCEDURE TRAY) ×2 IMPLANT
PAD ABD 8X10 STRL (GAUZE/BANDAGES/DRESSINGS) ×2 IMPLANT
PADDING CAST COTTON 6X4 STRL (CAST SUPPLIES) ×2 IMPLANT
PAT COMP 26MM KNEE (Knees) ×2 IMPLANT
PIN TROCAR 3 INCH (PIN) ×16 IMPLANT
POSITIONER SURGICAL ARM (MISCELLANEOUS) ×2 IMPLANT
SCREWON FULL STP TIB WED KNEE (Knees) ×2 IMPLANT
SET HNDPC FAN SPRY TIP SCT (DISPOSABLE) ×2 IMPLANT
SET PAD KNEE POSITIONER (MISCELLANEOUS) ×2 IMPLANT
SPONGE GAUZE 2X2 STER 10/PKG (GAUZE/BANDAGES/DRESSINGS)
STAPLER VISISTAT 35W (STAPLE) ×4 IMPLANT
STEM CEMENTED KNEE (Knees) ×2 IMPLANT
STRIP CLOSURE SKIN 1/2X4 (GAUZE/BANDAGES/DRESSINGS) IMPLANT
SUCTION FRAZIER 12FR DISP (SUCTIONS) ×2 IMPLANT
SUT MNCRL AB 4-0 PS2 18 (SUTURE) IMPLANT
SUT VIC AB 0 CT1 27 (SUTURE) ×4
SUT VIC AB 0 CT1 27XBRD ANTBC (SUTURE) ×4 IMPLANT
SUT VIC AB 1 CT1 27 (SUTURE) ×3
SUT VIC AB 1 CT1 27XBRD ANTBC (SUTURE) ×3 IMPLANT
SUT VIC AB 2-0 CT1 27 (SUTURE) ×2
SUT VIC AB 2-0 CT1 TAPERPNT 27 (SUTURE) ×2 IMPLANT
SYRINGE 60CC LL (MISCELLANEOUS) IMPLANT
TOWEL OR 17X26 10 PK STRL BLUE (TOWEL DISPOSABLE) ×2 IMPLANT
TOWEL OR NON WOVEN STRL DISP B (DISPOSABLE) ×2 IMPLANT
TOWER CARTRIDGE SMART MIX (DISPOSABLE) ×2 IMPLANT
TRAY FOLEY CATH 14FRSI W/METER (CATHETERS) ×2 IMPLANT
WATER STERILE IRR 1500ML POUR (IV SOLUTION) ×2 IMPLANT
WRAP KNEE MAXI GEL POST OP (GAUZE/BANDAGES/DRESSINGS) ×2 IMPLANT

## 2014-04-20 NOTE — Brief Op Note (Signed)
04/20/2014  5:28 PM  PATIENT:  [name]  63 y.o. female  PRE-OPERATIVE DIAGNOSIS:  Severe osteoarthritis right knee  POST-OPERATIVE DIAGNOSIS:  Severe osteoarthritis right knee  PROCEDURE:  Procedure(s): RIGHT TOTAL KNEE ARTHROPLASTY (Right)  SURGEON:  Surgeon(s) and Role:    * Mcarthur Rossetti, MD - Primary  PHYSICIAN ASSISTANT: Benita Stabile, PA-C  ANESTHESIA:   spinal  EBL:  Total I/O In: 3000 [I.V.:3000] Out: 300 [Urine:50; Blood:250]  BLOOD ADMINISTERED:none  DRAINS: (medium) Hemovact drain(s) in the knee joint with  Suction Open   LOCAL MEDICATIONS USED:  NONE  SPECIMEN:  No Specimen  DISPOSITION OF SPECIMEN:  N/A  COUNTS:  YES  TOURNIQUET:   Total Tourniquet Time Documented: Thigh (Right) - 118 minutes Total: Thigh (Right) - 118 minutes   DICTATION: .Other Dictation: Dictation Number [id number]  PLAN OF CARE: Admit to inpatient   PATIENT DISPOSITION:  PACU - hemodynamically stable.   Delay start of Pharmacological VTE agent (>24hrs) due to surgical blood loss or risk of bleeding: no

## 2014-04-20 NOTE — Progress Notes (Signed)
Dr Deirdre Priest notified IV infiltrated, pt had received 2 mg Dilaudid over one hour with little relief.  Orders received.

## 2014-04-20 NOTE — H&P (Signed)
TOTAL KNEE ADMISSION H&P  Patient is being admitted for right total knee arthroplasty.  Subjective:  Chief Complaint:right knee pain.  HPI: Margorie Black, 63 y.o. female, has a history of pain and functional disability in the right knee due to arthritis and has failed non-surgical conservative treatments for greater than 12 weeks to includeNSAID's and/or analgesics, corticosteriod injections, supervised PT with diminished ADL's post treatment, use of assistive devices, weight reduction as appropriate and activity modification.  Onset of symptoms was gradual, starting 5 years ago with gradually worsening course since that time. The patient noted no past surgery on the right knee(s).  Patient currently rates pain in the right knee(s) at 9 out of 10 with activity. Patient has night pain, worsening of pain with activity and weight bearing, pain that interferes with activities of daily living, pain with passive range of motion, crepitus and joint swelling.  Patient has evidence of subchondral sclerosis, periarticular osteophytes and joint space narrowing by imaging studies. There is no active infection.  Patient Active Problem List   Diagnosis Date Noted  . Arthritis of knee, right 04/20/2014  . Morbid obesity 04/20/2014  . Flushing reaction 03/10/2012  . DM 02/17/2007  . HYPERLIPIDEMIA 02/17/2007  . GOUT 02/17/2007  . Severe obesity (BMI >= 40) 02/17/2007  . HYPERTENSION 02/17/2007  . RHEUMATIC FEVER, HX OF 02/17/2007   Past Medical History  Diagnosis Date  . Hyperlipidemia   . Hypertension   . Gout     NO RECENT FLARE UPS  . GERD (gastroesophageal reflux disease)   . Shortness of breath     WITH EXERTION  . Diabetes mellitus     ORAL MEDICATION  . Arthritis     PAIN AND OA BOTH KNEES AND SHOULDERS AND ELBOWS AND WRIST  . H/O: rheumatic fever     AS A CHILD - NO KNOWN HEART MURMUR OR HEART PROBLEMS    Past Surgical History  Procedure Laterality Date  . Ventral hernia repair      x2  . Partial hysterectomy    . Cesarean section    . Esophagogastroduodenoscopy (egd) with propofol N/A 01/30/2013    Procedure: ESOPHAGOGASTRODUODENOSCOPY (EGD) WITH PROPOFOL;  Surgeon: Irene Shipper, MD;  Location: [facility];  Service: Endoscopy;  Laterality: N/A;  . Colonoscopy with propofol N/A 01/30/2013    Procedure: COLONOSCOPY WITH PROPOFOL;  Surgeon: Irene Shipper, MD;  Location: [facility];  Service: Endoscopy;  Laterality: N/A;  . Abdominal hysterectomy      No prescriptions prior to admission   Allergies  Allergen Reactions  . Aspirin     Regular ASA-stomach upset  . Other     SOME BANDAIDS CAUSE SKIN IRRITATION  . Penicillins Other (See Comments)    itching    History  Substance Use Topics  . Smoking status: Never Smoker   . Smokeless tobacco: Never Used  . Alcohol Use: No    Family History  Problem Relation Age of Onset  . Diabetes Mother   . Hypertension Mother     entire family  . Stroke Father     CVA  . Hyperlipidemia Father     entire family  . Crohn's disease Son   . Irritable bowel syndrome Son   . Diabetes Father   . Heart disease Father      Review of Systems  Musculoskeletal: Positive for joint pain.  All other systems reviewed and are negative.   Objective:  Physical Exam  Constitutional: She is oriented to person,  place, and time. She appears well-developed and well-nourished.  HENT:  Head: Normocephalic and atraumatic.  Eyes: EOM are normal. Pupils are equal, round, and reactive to light.  Neck: Normal range of motion. Neck supple.  Cardiovascular: Normal rate and regular rhythm.   Respiratory: Effort normal and breath sounds normal.  GI: Soft. Bowel sounds are normal.  Musculoskeletal:       Right knee: She exhibits decreased range of motion, swelling, effusion and abnormal alignment. Tenderness found. Medial joint line and lateral joint line tenderness noted.  Neurological: She is alert and oriented to person, place, and time.   Skin: Skin is warm and dry.  Psychiatric: She has a normal mood and affect.    Vital signs in last 24 hours:    Labs:   Estimated body mass index is 51.03 kg/(m^2) as calculated from the following:   Height as of 01/30/13: 5' 6"  (1.676 m).   Weight as of 05/26/13: 143.337 kg (316 lb).   Imaging Review Plain radiographs demonstrate severe degenerative joint disease of the right knee(s). The overall alignment ismild varus. The bone quality appears to be good for age and reported activity level.  Assessment/Plan:  End stage arthritis, right knee   The patient history, physical examination, clinical judgment of the provider and imaging studies are consistent with end stage degenerative joint disease of the right knee(s) and total knee arthroplasty is deemed medically necessary. The treatment options including medical management, injection therapy arthroscopy and arthroplasty were discussed at length. The risks and benefits of total knee arthroplasty were presented and reviewed. The risks due to aseptic loosening, infection, stiffness, patella tracking problems, thromboembolic complications and other imponderables were discussed. The patient acknowledged the explanation, agreed to proceed with the plan and consent was signed. Patient is being admitted for inpatient treatment for surgery, pain control, PT, OT, prophylactic antibiotics, VTE prophylaxis, progressive ambulation and ADL's and discharge planning. The patient is planning to be discharged home with home health services

## 2014-04-20 NOTE — Anesthesia Preprocedure Evaluation (Addendum)
Anesthesia Evaluation  Patient identified by MRN, date of birth, ID band Patient awake  General Assessment Comment: Arthritis of knee, right  04/20/2014   .  Morbid obesity  04/20/2014   .  Flushing reaction  03/10/2012   .  DM  02/17/2007   .  HYPERLIPIDEMIA  02/17/2007   .  GOUT  02/17/2007   .  Severe obesity (BMI >= 40)  02/17/2007   .  HYPERTENSION  02/17/2007   .  RHEUMATIC FEVER, HX OF     Reviewed: Allergy & Precautions, H&P , NPO status , Patient's Chart, lab work & pertinent test results  Airway Mallampati: II TM Distance: >3 FB Neck ROM: Full    Dental no notable dental hx.    Pulmonary shortness of breath,  breath sounds clear to auscultation  Pulmonary exam normal       Cardiovascular hypertension, Pt. on medications + Peripheral Vascular Disease Rhythm:Regular Rate:Normal     Neuro/Psych negative neurological ROS  negative psych ROS   GI/Hepatic Neg liver ROS, GERD-  Medicated,  Endo/Other  diabetes, Type 2, Oral Hypoglycemic AgentsMorbid obesity  Renal/GU negative Renal ROS  negative genitourinary   Musculoskeletal negative musculoskeletal ROS (+)   Abdominal (+) + obese,   Peds negative pediatric ROS (+)  Hematology negative hematology ROS (+)   Anesthesia Other Findings   Reproductive/Obstetrics negative OB ROS                          Anesthesia Physical Anesthesia Plan  ASA: III  Anesthesia Plan: Spinal   Post-op Pain Management:    Induction: Intravenous  Airway Management Planned:   Additional Equipment:   Intra-op Plan:   Post-operative Plan:   Informed Consent: I have reviewed the patients History and Physical, chart, labs and discussed the procedure including the risks, benefits and alternatives for the proposed anesthesia with the patient or authorized representative who has indicated his/her understanding and acceptance.   Dental advisory  given  Plan Discussed with: CRNA  Anesthesia Plan Comments: (Discussed general and spinal. Discussed risks/benefits of spinal including headache, backache, failure, bleeding, infection, and nerve damage. Patient consents to spinal. Questions answered. Coagulation studies and platelet count acceptable.)       Anesthesia Quick Evaluation

## 2014-04-20 NOTE — Transfer of Care (Signed)
Immediate Anesthesia Transfer of Care Note  Patient: Rachel Black  Procedure(s) Performed: Procedure(s): RIGHT TOTAL KNEE ARTHROPLASTY (Right)  Patient Location: PACU  Anesthesia Type:Spinal  Level of Consciousness: awake, alert , oriented and patient cooperative  Airway & Oxygen Therapy: Patient Spontanous Breathing and Patient connected to face mask oxygen  Post-op Assessment: Report given to PACU RN, Post -op Vital signs reviewed and stable and Patient moving all extremities X 4  Post vital signs: stable  Complications: No apparent anesthesia complications L3 spinal level

## 2014-04-21 LAB — CBC
HCT: 30.9 % — ABNORMAL LOW (ref 36.0–46.0)
HEMOGLOBIN: 10.3 g/dL — AB (ref 12.0–15.0)
MCH: 30.6 pg (ref 26.0–34.0)
MCHC: 33.3 g/dL (ref 30.0–36.0)
MCV: 91.7 fL (ref 78.0–100.0)
Platelets: 150 10*3/uL (ref 150–400)
RBC: 3.37 MIL/uL — ABNORMAL LOW (ref 3.87–5.11)
RDW: 14 % (ref 11.5–15.5)
WBC: 10.6 10*3/uL — ABNORMAL HIGH (ref 4.0–10.5)

## 2014-04-21 LAB — BASIC METABOLIC PANEL
Anion gap: 16 — ABNORMAL HIGH (ref 5–15)
BUN: 16 mg/dL (ref 6–23)
CHLORIDE: 99 meq/L (ref 96–112)
CO2: 21 meq/L (ref 19–32)
Calcium: 9 mg/dL (ref 8.4–10.5)
Creatinine, Ser: 0.95 mg/dL (ref 0.50–1.10)
GFR calc Af Amer: 73 mL/min — ABNORMAL LOW (ref >90)
GFR calc non Af Amer: 63 mL/min — ABNORMAL LOW (ref >90)
Glucose, Bld: 167 mg/dL — ABNORMAL HIGH (ref 70–99)
POTASSIUM: 4.4 meq/L (ref 3.7–5.3)
Sodium: 136 mEq/L — ABNORMAL LOW (ref 137–147)

## 2014-04-21 LAB — GLUCOSE, CAPILLARY
GLUCOSE-CAPILLARY: 134 mg/dL — AB (ref 70–99)
Glucose-Capillary: 189 mg/dL — ABNORMAL HIGH (ref 70–99)

## 2014-04-21 MED ORDER — OXYCODONE-ACETAMINOPHEN 5-325 MG PO TABS: 1.0000 | ORAL_TABLET | ORAL | Status: DC | PRN

## 2014-04-21 NOTE — Progress Notes (Signed)
Physical Therapy Treatment Note   04/21/14 1600  PT Visit Information  Last PT Received On 04/21/14  Assistance Needed +2  History of Present Illness Pt is a 63 year old female s/p R TKA with hx of morbid obesity, DM, HTN, gout, SOB  PT Time Calculation  PT Start Time 1530  PT Stop Time 1601  PT Time Calculation (min) 31 min  Subjective Data  Subjective Pt assisted to [facility] and felt unable to tolerate ambulation this afternoon so assisted back to bed.  Discussed need to improve mobility or pt may need ST-SNF.  Precautions  Precautions Fall;Knee  Required Braces or Orthoses Knee Immobilizer - Right  Restrictions  Weight Bearing Restrictions No  Pain Assessment  Pain Assessment 0-10  Pain Score 5  Pain Location R knee  Pain Descriptors / Indicators Sharp  Pain Intervention(s) Limited activity within patient's tolerance;Monitored during session;Premedicated before session;Repositioned (nurse tech to apply ice packs)  Cognition  Arousal/Alertness Awake/alert  Behavior During Therapy WFL for tasks assessed/performed  Overall Cognitive Status Within Functional Limits for tasks assessed  Bed Mobility  Overal bed mobility Needs Assistance  Bed Mobility Sit to Supine  Sit to supine Mod assist  General bed mobility comments assist for LEs onto bed  Transfers  Overall transfer level Needs assistance  Equipment used Rolling walker (2 wheeled)  Transfers Sit to/from [employer] Transfers  Sit to Stand Mod assist;+2 physical assistance;+2 safety/equipment  Stand pivot transfers Mod assist;+2 physical assistance;+2 safety/equipment  General transfer comment verbal cues for technique including UE and LE placement, increased times to take a few steps from recliner to [facility] then took a few steps backwards to bed  Exercises  Exercises Total Joint  Total Joint Exercises  Ankle Circles/Pumps AROM;15 reps;Both;Supine  Quad Sets AROM;15 reps;Supine;Both  Short Arc Quad AAROM;10  reps;Supine;Right  Heel Slides AAROM;Right;10 reps;Supine  Hip ABduction/ADduction AAROM;10 reps;Right;Supine  Goniometric ROM approx 50* knee flexion (difficult to tell due to body habitus)  PT - End of Session  Equipment Utilized During Treatment Gait belt;Right knee immobilizer  Activity Tolerance Patient limited by pain  Patient left with call bell/phone within reach;in bed  PT - Assessment/Plan  PT Plan Current plan remains appropriate  PT Frequency 7X/week  Follow Up Recommendations SNF  PT equipment Rolling walker with 5" wheels  PT Goal Progression  Progress towards PT goals Progressing toward goals  PT General Charges  $$ ACUTE PT VISIT 1 Procedure  PT Treatments  $Therapeutic Exercise 8-22 mins  $Therapeutic Activity 8-22 mins   Carmelia Bake, PT, DPT 04/21/2014 Pager: [phone]

## 2014-04-21 NOTE — Anesthesia Postprocedure Evaluation (Signed)
  Anesthesia Post-op Note  Patient: Rachel Black  Procedure(s) Performed: Procedure(s) (LRB): RIGHT TOTAL KNEE ARTHROPLASTY (Right)  Patient Location: PACU  Anesthesia Type: Spinal  Level of Consciousness: awake and alert   Airway and Oxygen Therapy: Patient Spontanous Breathing  Post-op Pain: mild  Post-op Assessment: Post-op Vital signs reviewed, Patient's Cardiovascular Status Stable, Respiratory Function Stable, Patent Airway and No signs of Nausea or vomiting  Last Vitals:  Filed Vitals:   04/21/14 0517  BP: 113/67  Pulse: 106  Temp: 36.4 C  Resp: 17    Post-op Vital Signs: stable   Complications: No apparent anesthesia complications

## 2014-04-21 NOTE — Op Note (Signed)
Rachel Black, RAMNAUTH NO.:  [id number]  MEDICAL RECORD NO.:  [id number]  LOCATION:  5909                         FACILITY:  [facility]  PHYSICIAN:  Lind Guest. Ninfa Linden, M.D.DATE OF BIRTH:  03-Jun-1951  DATE OF PROCEDURE:  04/20/2014 DATE OF DISCHARGE:                              OPERATIVE REPORT   PREOPERATIVE DIAGNOSES: 1. Severe end-stage arthritis and degenerative joint disease, right     knee. 2. Morbid obesity.  POSTOPERATIVE DIAGNOSES: 1. Severe end-stage arthritis and degenerative joint disease, right     knee. 2. Morbid obesity.  PROCEDURE:  Right primary total knee arthroplasty.  IMPLANTS:  Smith and Nephew legion primary knee with size 4 right femur, size 3 legion revision tray, tibial component with 10 mm full wedge and a 10 x 120 stem, a size 15 constrained polyethylene liner, size 26 patella button.  SURGEON:  Lind Guest. Ninfa Linden, M.D.  ASSISTANT:  Erskine Emery, PA-C.  ANESTHESIA:  Spinal.  ANTIBIOTICS:  900 mg of IV clindamycin.  BLOOD LOSS:  Less than 500 mL.  COMPLICATIONS:  None.  TOURNIQUET TIME:  Less than 2 hours.  INDICATIONS:  Rachel Black is a very morbidly obese 63 year old female with varus deformities of her knees and significant end-stage arthritis that is affecting all activities of daily living, mobility, and having severe daily pain.  She has failed all forms of conservative treatment.  X-ray show severe varus deformities of her both knees with the right worse than left.  She has a complete loss of medial joint space and severe tricompartmental arthritic changes.  She understands the difficulty in total knee arthroplasties in someone with her obesity and understands that the complication rate is high.  I have explained this in detail and tried to talk her out of the surgery; however, her mobility is so limiting and her pain is so severe.  She understands that the risks are high for surgery like this, but she  willing to risks to try to help with her mobility and pain and increase her quality of life.  PROCEDURE DESCRIPTION:  After informed consent obtained, appropriate right leg was marked.  She was brought to the operating room and placed supine on the operating room table.  She was then set up for spinal anesthesia to be obtained.  She was then laid back in the supine position.  Her right operative leg had a nonsterile tourniquet was placed around the high operative thigh and then leg was prepped and draped with DuraPrep and sterile drapes including sterile stockinette. Preoperatively, her knee does hyperextend and flexes fully.  She is significantly obese.  A time-out was called to identify correct patient, correct right leg after prepping with DuraPrep and sterile drapes.  We then used an Esmarch to wrap the leg and tourniquet was inflated to 300 mm of pressure.  I made a midline incision over the patella and carried this proximally and distally.  I dissected down the knee joint.  Due to significant adipose tissue and performed a medial parapatellar arthrotomy, we were able to clean the knee of osteophytes, and remnants of the medial and lateral meniscus as well as the ACL and PCL.  With the knee  in a flexed position then we set our cutting guide, taking 9 mm off the high side.  It was quite difficult to get a gauge on this due to her obese tissue and her varus deformity.  It was also hard to gauge slope. I was able to make my tibial cut and then went to the femur and did intramedullary guide for the femur set our distal femoral cut off the right knee cutting guide at 5 degrees external rotator.  We made our distal femoral cut at 10 mm.  With that being said, she still significantly hyperextended with an extension block of 9 mm.  A way back to the femur, put our sizing guide based off Whiteside's line and the epicondylar axis and chose a size 4 femur.  We made our anterior and posterior  cuts as well as our 4 in 1 chamfer cuts.  We then made our femoral box cut.  We went to the tibia and did our keel for the tibia and with the keel in place, we trialed up to a 15 insert and I still felt like she hyperextended too much, so the decision was made to proceed with a revision tibial tray.  We then drilled and reamed up to a size 12 for placing a 10 mm stem.  Once, we had made these cuts, we trialed the revision size 3 tibial tray with a size 4 femur.  We went up to a size 15 polyethylene trial and I felt that she had good stability and range of motion with this.  We then made our patellar cut and drilled 3 small for size 26 patella which was quite small.  We then removed all trial components.  We copiously irrigated the knee with normal saline solution using pulsatile lavage.  We placed a cement restrictor guide and we cemented the real Smith and Nephew legion revision stem with the size 3 tibial tray, a 10 mm wedge.  We cemented the real size 4 femur with lugs and then placed the real size 15 constrained polyethylene insert.  We then cemented the patellar button. Once the cement had dried, hemostasis was obtained with electrocautery and the tourniquet was let down.  We removed cement debris from the knee.  I then irrigated the knee with another liter of normal saline solution.  We then placed a medium Hemovac in the arthrotomy and closed the arthrotomy with interrupted #1 Vicryl suture followed by 0 Vicryl in the deep tissue, 2-0 Vicryl in the subcutaneous tissue, and staples on the skin.  Well-padded sterile dressing was was applied and she was taken to the recovery room in stable condition.  Of note, Erskine Emery, PA-C assisted in the entire case and his assistance was most crucial in this case due to the patient's morbid obesity.     Lind Guest. Ninfa Linden, M.D.     CYB/MEDQ  D:  04/20/2014  T:  04/20/2014  Job:  [id number]

## 2014-04-21 NOTE — Progress Notes (Signed)
OT Cancellation Note  Patient Details Name: LIDWINA KANER MRN: [id number] DOB: Aug 06, 1951   Cancelled Treatment:    Reason Eval/Treat Not Completed: Pain limiting ability to participate.  Pt requesting pain medication now.  Will check back tomorrow.  Kerrilyn Azbill 04/21/2014, 2:55 PM Lesle Chris, OTR/L [phone] 04/21/2014

## 2014-04-21 NOTE — Evaluation (Signed)
Physical Therapy Evaluation Patient Details Name: Rachel Black MRN: [id number] DOB: Nov 14, 1950 Today's Date: 04/21/2014   History of Present Illness  Pt is a 63 year old female s/p R TKA with hx of morbid obesity, DM, HTN, gout, SOB  Clinical Impression  Pt is s/p R TKA resulting in the deficits listed below (see PT Problem List).  Pt will benefit from skilled PT to increase their independence and safety with mobility to allow discharge to the venue listed below.  Pt plans to d/c home however will need to improve mobility otherwise will need SNF.  Pt with limited mobility during evaluation likely to due pain.     Follow Up Recommendations Home health PT    Equipment Recommendations  Rolling walker with 5" wheels (wide)    Recommendations for Other Services       Precautions / Restrictions Precautions Precautions: Fall;Knee Required Braces or Orthoses: Knee Immobilizer - Right Restrictions Weight Bearing Restrictions: No      Mobility  Bed Mobility Overal bed mobility: Needs Assistance Bed Mobility: Supine to Sit     Supine to sit: Min assist;HOB elevated     General bed mobility comments: verbal cues for technique, assist for R LE  Transfers Overall transfer level: Needs assistance Equipment used: Rolling walker (2 wheeled) Transfers: Sit to/from [employer] Sit to Stand: Max assist;+2 physical assistance Stand pivot transfers: Mod assist;+2 physical assistance       General transfer comment: verbal cues for technique including UE and LE placement, increased times to take a few steps from bed to chair due to increased pain  Ambulation/Gait                Stairs            Wheelchair Mobility    Modified Rankin (Stroke Patients Only)       Balance                                             Pertinent Vitals/Pain Pain Assessment: 0-10 Pain Score: 6  Pain Location: R knee Pain Descriptors / Indicators:  Constant;Sharp Pain Intervention(s): Limited activity within patient's tolerance;Monitored during session;Premedicated before session;Repositioned;Ice applied    Home Living Family/patient expects to be discharged to:: Private residence Living Arrangements: Spouse/significant other   Type of Home: House Home Access: Stairs to enter Entrance Stairs-Rails: Right Entrance Stairs-Number of Steps: 3-4 Home Layout: One level Home Equipment: Crutches      Prior Function Level of Independence: Independent               Hand Dominance        Extremity/Trunk Assessment               Lower Extremity Assessment: RLE deficits/detail RLE Deficits / Details: unable to perform SLR, maintained KI       Communication   Communication: No difficulties  Cognition Arousal/Alertness: Awake/alert Behavior During Therapy: WFL for tasks assessed/performed Overall Cognitive Status: Within Functional Limits for tasks assessed                      General Comments      Exercises        Assessment/Plan    PT Assessment Patient needs continued PT services  PT Diagnosis Difficulty walking;Acute pain   PT Problem List Decreased strength;Decreased activity tolerance;Decreased range  of motion;Decreased mobility;Decreased knowledge of precautions;Pain;Decreased knowledge of use of DME;Obesity  PT Treatment Interventions Functional mobility training;Stair training;Gait training;DME instruction;Patient/family education;Therapeutic activities;Therapeutic exercise   PT Goals (Current goals can be found in the Care Plan section) Acute Rehab PT Goals PT Goal Formulation: With patient Time For Goal Achievement: 04/28/14 Potential to Achieve Goals: Good    Frequency 7X/week   Barriers to discharge        Co-evaluation               End of Session Equipment Utilized During Treatment: Gait belt;Right knee immobilizer Activity Tolerance: Patient limited by pain Patient  left: in chair;with call bell/phone within reach           Time: 1204-1230 PT Time Calculation (min): 26 min   Charges:   PT Evaluation $Initial PT Evaluation Tier I: 1 Procedure PT Treatments $Therapeutic Activity: 8-22 mins   PT G Codes:          Daisy Mcneel,KATHrine E 04/21/2014, 1:38 PM Carmelia Bake, PT, DPT 04/21/2014 Pager: [phone]

## 2014-04-21 NOTE — Progress Notes (Signed)
Subjective: 1 Day Post-Op Procedure(s) (LRB): RIGHT TOTAL KNEE ARTHROPLASTY (Right) Patient reports pain as moderate.  Nausea and vomiting this AM , feels some improvement at present.  Objective: Vital signs in last 24 hours: Temp:  [97.5 F (36.4 C)-98.9 F (37.2 C)] 97.5 F (36.4 C) (08/08 0517) Pulse Rate:  [96-110] 106 (08/08 0517) Resp:  [12-25] 17 (08/08 0517) BP: (103-150)/(53-82) 113/67 mmHg (08/08 0517) SpO2:  [97 %-100 %] 100 % (08/08 0517) Weight:  [144.244 kg (318 lb)] 144.244 kg (318 lb) (08/08 0040)  Intake/Output from previous day: 08/07 0701 - 08/08 0700 In: 4578.8 [P.O.:240; I.V.:4338.8] Out: 1530 [Urine:470; Drains:810; Blood:250] Intake/Output this shift: Total I/O In: -  Out: 125 [Urine:125]   Recent Labs  04/21/14 0530  HGB 10.3*    Recent Labs  04/21/14 0530  WBC 10.6*  RBC 3.37*  HCT 30.9*  PLT 150    Recent Labs  04/21/14 0530  NA 136*  K 4.4  CL 99  CO2 21  BUN 16  CREATININE 0.95  GLUCOSE 167*  CALCIUM 9.0   No results found for this basename: LABPT, INR,  in the last 72 hours  Neurovascular intact Dorsiflexion/Plantar flexion intact Incision: dressing C/D/I Compartment soft   Assessment/Plan: 1 Day Post-Op Procedure(s) (LRB): RIGHT TOTAL KNEE ARTHROPLASTY (Right) Up with therapy Hemovac with 250 plus cc output Post-op anemia will monitor for symptoms   Ranesha Val 04/21/2014, 9:06 AM

## 2014-04-22 LAB — CBC
HEMATOCRIT: 27.2 % — AB (ref 36.0–46.0)
Hemoglobin: 9 g/dL — ABNORMAL LOW (ref 12.0–15.0)
MCH: 30.5 pg (ref 26.0–34.0)
MCHC: 33.1 g/dL (ref 30.0–36.0)
MCV: 92.2 fL (ref 78.0–100.0)
PLATELETS: 130 10*3/uL — AB (ref 150–400)
RBC: 2.95 MIL/uL — ABNORMAL LOW (ref 3.87–5.11)
RDW: 14.3 % (ref 11.5–15.5)
WBC: 9.8 10*3/uL (ref 4.0–10.5)

## 2014-04-22 LAB — GLUCOSE, CAPILLARY: GLUCOSE-CAPILLARY: 127 mg/dL — AB (ref 70–99)

## 2014-04-22 NOTE — Progress Notes (Signed)
Subjective: 2 Days Post-Op Procedure(s) (LRB): RIGHT TOTAL KNEE ARTHROPLASTY (Right) Patient reports pain as moderate to severe.  Nausea better positive flatus. Slow progress with PT.  Objective: Vital signs in last 24 hours: Temp:  [98.2 F (36.8 C)-98.6 F (37 C)] 98.3 F (36.8 C) (08/09 0537) Pulse Rate:  [84-95] 86 (08/09 0537) Resp:  [16] 16 (08/09 0537) BP: (96-140)/(46-64) 140/64 mmHg (08/09 0537) SpO2:  [94 %-100 %] 97 % (08/09 0537)  Intake/Output from previous day: 08/08 0701 - 08/09 0700 In: 1503.8 [P.O.:1080; I.V.:313.8; IV Piggyback:110] Out: 225 [Urine:225] Intake/Output this shift:     Recent Labs  04/21/14 0530 04/22/14 0415  HGB 10.3* 9.0*    Recent Labs  04/21/14 0530 04/22/14 0415  WBC 10.6* 9.8  RBC 3.37* 2.95*  HCT 30.9* 27.2*  PLT 150 130*    Recent Labs  04/21/14 0530  NA 136*  K 4.4  CL 99  CO2 21  BUN 16  CREATININE 0.95  GLUCOSE 167*  CALCIUM 9.0   No results found for this basename: LABPT, INR,  in the last 72 hours  Sensation intact distally Intact pulses distally Incision: scant drainage Compartment soft  Assessment/Plan: 2 Days Post-Op Procedure(s) (LRB): RIGHT TOTAL KNEE ARTHROPLASTY (Right) Up with therapy Dispo depending upon progress with PT Monitor for symptoms of anemia  Rachel Black 04/22/2014, 9:10 AM

## 2014-04-22 NOTE — Evaluation (Addendum)
Occupational Therapy Evaluation Patient Details Name: Rachel Black MRN: [id number] DOB: Apr 16, 1951 Today's Date: 04/22/2014    History of Present Illness Pt is a 63 year old female s/p R TKA with hx of morbid obesity, DM, HTN, gout, SOB   Clinical Impression   Pt was admitted for R TKA.  At time of evaluation, pt was sitting on commode and feet had gone numb:  Pt needed max A x 2 for transfer and max to total A x 2 for LB ADLs.   Husband is very involved and was helping with her self-care.  Pt will benefit from skilled OT to increase safety and independence with adls.  Goals in acute are set at overall min A level.    Follow Up Recommendations  SNF    Equipment Recommendations  3 in 1 bedside comode (wide)    Recommendations for Other Services       Precautions / Restrictions Precautions Precautions: Fall;Knee Required Braces or Orthoses: Knee Immobilizer - Right Restrictions Weight Bearing Restrictions: No      Mobility Bed Mobility         Supine to sit: +2 for safety/equipment;Min assist     General bed mobility comments: assist for RLE  Transfers     Transfers: Sit to/from Stand Sit to Stand: +2 physical assistance;+2 safety/equipment;Max assist         General transfer comment: vcs for technique.  Slow transition.  Max A to weight shift forward    Balance                                            ADL Overall ADL's : Needs assistance/impaired     Grooming: Set up;Sitting   Upper Body Bathing: Set up;Sitting   Lower Body Bathing: +2 for physical assistance;Maximal assistance   Upper Body Dressing : Minimal assistance;Sitting   Lower Body Dressing: +2 for physical assistance;Maximal assistance;Sit to/from stand;+2 for safety/equipment   Toilet Transfer: +2 for safety/equipment;+2 for physical assistance;Maximal assistance;Stand-pivot   Toileting- Clothing Manipulation and Hygiene: +2 for physical assistance;Total  assistance;Sit to/from stand         General ADL Comments: Pt was sitting on commode and husband helped with bathing.  Pt c/o numbness in bil feet:  NT assisted OT and husband helped to move bed closer.  Pt was able to move feel slowly for transfer.  Extra time required; dyspnea 2/4.  Encouraged rest break prior to lying down and in between moving over to reposition     Vision                     Perception     Praxis      Pertinent Vitals/Pain Pain Assessment: 0-10 Pain Score: 5; pt was 10 with SPT Pain Descriptors / Indicators: Aching Pain Intervention(s): Limited activity within patient's tolerance;Repositioned;Ice applied     Hand Dominance     Extremity/Trunk Assessment Upper Extremity Assessment Upper Extremity Assessment: Generalized weakness (c/o arthritis pain)           Communication Communication Communication: No difficulties   Cognition Arousal/Alertness: Awake/alert Behavior During Therapy: WFL for tasks assessed/performed Overall Cognitive Status: Within Functional Limits for tasks assessed                     General Comments       Exercises  Shoulder Instructions      Home Living                                   Additional Comments: considering SNF.  doesn't have any DME      Prior Functioning/Environment Level of Independence: Independent             OT Diagnosis: Generalized weakness   OT Problem List: Decreased strength;Decreased activity tolerance;Pain;Decreased knowledge of use of DME or AE   OT Treatment/Interventions: Self-care/ADL training;DME and/or AE instruction;Patient/family education;Energy conservation;Therapeutic activities    OT Goals(Current goals can be found in the care plan section) Acute Rehab OT Goals Patient Stated Goal: none stated OT Goal Formulation: With patient Time For Goal Achievement: 04/29/14 Potential to Achieve Goals: Good  OT Frequency: Min 2X/week    Barriers to D/C:            Co-evaluation              End of Session  Asked RN to contact ortho tech when they are in.  KI is too small  Activity Tolerance: Patient limited by fatigue Patient left: in bed;with call bell/phone within reach;with family/visitor present   Time: 0826-0904 OT Time Calculation (min): 38 min Charges:  OT General Charges $OT Visit: 1 Procedure OT Evaluation $Initial OT Evaluation Tier I: 1 Procedure OT Treatments $Self Care/Home Management : 8-22 mins $Therapeutic Activity: 8-22 mins G-Codes:    Rachel Black 05/12/14, 9:33 AM   Lesle Chris, OTR/L [phone] 05-12-2014

## 2014-04-22 NOTE — Discharge Instructions (Signed)
Information on my medicine - XARELTO® (Rivaroxaban) ° °This medication education was reviewed with me or my healthcare representative as part of my discharge preparation.  The pharmacist that spoke with me during my hospital stay was:  Courtney Fenlon L, RPH ° °Why was Xarelto® prescribed for you? °Xarelto® was prescribed for you to reduce the risk of blood clots forming after orthopedic surgery. The medical term for these abnormal blood clots is venous thromboembolism (VTE). ° °What do you need to know about xarelto® ? °Take your Xarelto® ONCE DAILY at the same time every day. °You may take it either with or without food. ° °If you have difficulty swallowing the tablet whole, you may crush it and mix in applesauce just prior to taking your dose. ° °Take Xarelto® exactly as prescribed by your doctor and DO NOT stop taking Xarelto® without talking to the doctor who prescribed the medication.  Stopping without other VTE prevention medication to take the place of Xarelto® may increase your risk of developing a clot. ° °After discharge, you should have regular check-up appointments with your healthcare provider that is prescribing your Xarelto®.   ° °What do you do if you miss a dose? °If you miss a dose, take it as soon as you remember on the same day then continue your regularly scheduled once daily regimen the next day. Do not take two doses of Xarelto® on the same day.  ° °Important Safety Information °A possible side effect of Xarelto® is bleeding. You should call your healthcare provider right away if you experience any of the following: °? Bleeding from an injury or your nose that does not stop. °? Unusual colored urine (red or dark brown) or unusual colored stools (red or black). °? Unusual bruising for unknown reasons. °? A serious fall or if you hit your head (even if there is no bleeding). ° °Some medicines may interact with Xarelto® and might increase your risk of bleeding while on Xarelto®. To help avoid this,  consult your healthcare provider or pharmacist prior to using any new prescription or non-prescription medications, including herbals, vitamins, non-steroidal anti-inflammatory drugs (NSAIDs) and supplements. ° °This website has more information on Xarelto®: www.xarelto.com. ° ° ° °

## 2014-04-22 NOTE — Plan of Care (Signed)
Problem: Phase III Progression Outcomes Goal: Ambulates Outcome: Not Met (add Reason) Poor mobility

## 2014-04-22 NOTE — Progress Notes (Signed)
Physical Therapy Treatment Patient Details Name: Rachel Black MRN: [id number] DOB: 06/05/1951 Today's Date: 04/22/2014    History of Present Illness Pt is a 63 year old female s/p R TKA with hx of morbid obesity, DM, HTN, gout, SOB    PT Comments    POD # 2 pm session.  Assisted pt out of recliner to [facility] required + 3 assist due to lower surface level. Great difficulty weigh shifting and advancing either LE to steps and turns.  Assisted off BSC also required + 3 assist.  Pt was only able to take a few back steps to bed.  Assisted back to bed MAX assist to support B LE's and use of trapeze to scoot pt to [facility].   Follow Up Recommendations  SNF     Equipment Recommendations  Rolling walker with 5" wheels    Recommendations for Other Services       Precautions / Restrictions Precautions Precautions: Fall;Knee Precaution Comments: Instructed pt on KI use for amb Required Braces or Orthoses: Knee Immobilizer - Right Restrictions Weight Bearing Restrictions: No Other Position/Activity Restrictions: WBAT    Mobility  Bed Mobility Overal bed mobility: Needs Assistance Bed Mobility: Sit to Supine     Supine to sit: Max assist;+2 for physical assistance Sit to supine: +2 for physical assistance;Max assist   General bed mobility comments: Max assist to get pt back to bed with use of trapeze to pull to [facility] and center pt using bed pad + 2 assist.    Transfers Overall transfer level: Needs assistance Equipment used: Rolling walker (2 wheeled) Transfers: Sit to/from Stand Sit to Stand: +2 physical assistance;+2 safety/equipment;From elevated surface;Total assist Stand pivot transfers: Mod assist;+2 physical assistance;+2 safety/equipment       General transfer comment: increased assist needed to assist pt out of lower level recliner chair and partially pivot to [facility].  Required + 3 assist.  Had to remove KI to perform transfer to prevent pt from sliding out of recliner.  X 3 attempts  before she was able to stand.  Again, great difficulty weight shifting and advancing either LE.    Ambulation/Gait Gait velocity: pt was unable to attempt amb after using BSC.  Just a few back steps to bed.       Stairs            Wheelchair Mobility    Modified Rankin (Stroke Patients Only)       Balance                                    Cognition Arousal/Alertness: Awake/alert Behavior During Therapy: WFL for tasks assessed/performed Overall Cognitive Status: Within Functional Limits for tasks assessed                      Exercises      General Comments        Pertinent Vitals/Pain Pain Assessment: 0-10 Pain Score: 5  Pain Descriptors / Indicators: Aching Pain Intervention(s): Limited activity within patient's tolerance;Repositioned;Ice applied    Home Living                 Additional Comments: considering SNF.  doesn't have any AE    Prior Function Level of Independence: Independent          PT Goals (current goals can now be found in the care plan section) Acute Rehab PT Goals Patient Stated  Goal: none stated Progress towards PT goals: Progressing toward goals    Frequency  7X/week    PT Plan      Co-evaluation             End of Session Equipment Utilized During Treatment: Gait belt Activity Tolerance: Patient limited by fatigue Patient left: in bed;with call bell/phone within reach;with family/visitor present     Time: 1115-1140 PT Time Calculation (min): 25 min  Charges:  $Therapeutic Activity: 23-37 mins                    G Codes:      Rica Koyanagi  PTA [facility]  Acute  Rehab Pager      [phone]

## 2014-04-22 NOTE — Plan of Care (Signed)
Problem: Phase III Progression Outcomes Goal: Anticoagulant follow-up in place Outcome: Not Applicable Date Met:  47/42/59 xarelto

## 2014-04-22 NOTE — Progress Notes (Signed)
Physical Therapy Treatment Patient Details Name: Rachel Black MRN: [id number] DOB: 04-05-1951 Today's Date: 04/22/2014    History of Present Illness Pt is a 63 year old female s/p R TKA with hx of morbid obesity, DM, HTN, gout, SOB    PT Comments    POD # 2 am session.  Applied KI and instructed pt on use for amb.  Assisted pt OOB to [facility] required increased time and + 2 total assist.  Great difficulty weight shifting to progress mobility and  Limited activity tolerance.  Pt will need ST Rehab at SNF before she is able to go home.  Follow Up Recommendations  SNF     Equipment Recommendations  Rolling walker with 5" wheels    Recommendations for Other Services       Precautions / Restrictions Precautions Precautions: Fall;Knee Precaution Comments: Instructed pt on KI use for amb Required Braces or Orthoses: Knee Immobilizer - Right Restrictions Weight Bearing Restrictions: No Other Position/Activity Restrictions: WBAT    Mobility  Bed Mobility Overal bed mobility: Needs Assistance Bed Mobility: Supine to Sit     Supine to sit: Max assist;+2 for physical assistance     General bed mobility comments: assist for RLE and increased time due to pt's BMI  Transfers Overall transfer level: Needs assistance Equipment used: Rolling walker (2 wheeled) Transfers: Sit to/from Stand Sit to Stand: +2 physical assistance;+2 safety/equipment;Max assist;From elevated surface         General transfer comment: 50% VC's on proper tech and hand placement.  Increased time to complete 1/4 turn from bed to [facility] so performed partial and switched out equipment behind her.  Ambulation/Gait Ambulation/Gait assistance: +2 physical assistance;+2 safety/equipment;Max assist Ambulation Distance (Feet): 2 Feet Assistive device: Rolling walker (2 wheeled) Gait Pattern/deviations: Step-to pattern;Wide base of support;Trunk flexed;Shuffle Gait velocity: decreased   General Gait Details: great  difficulty weight shifting and advancing either LE.  Pt was wearing her KI and ACE wrapped used to correct foot drop.  Pt stated her other knee was "just as bad".  Recliner chair brought to pt from behind.     Stairs            Wheelchair Mobility    Modified Rankin (Stroke Patients Only)       Balance                                    Cognition Arousal/Alertness: Awake/alert Behavior During Therapy: WFL for tasks assessed/performed Overall Cognitive Status: Within Functional Limits for tasks assessed                      Exercises      General Comments        Pertinent Vitals/Pain Pain Assessment: 0-10 Pain Score: 5  Pain Descriptors / Indicators: Aching Pain Intervention(s): Limited activity within patient's tolerance;Repositioned;Ice applied    Home Living                 Additional Comments: considering SNF.  doesn't have any AE    Prior Function Level of Independence: Independent          PT Goals (current goals can now be found in the care plan section) Acute Rehab PT Goals Patient Stated Goal: none stated Progress towards PT goals: Progressing toward goals    Frequency  7X/week    PT Plan      Co-evaluation  End of Session Equipment Utilized During Treatment: Gait belt;Right knee immobilizer Activity Tolerance: Patient limited by pain;Patient limited by fatigue;Other (comment) (limited by BMI) Patient left: in chair;with call bell/phone within reach;with family/visitor present     Time: 0926-0958 PT Time Calculation (min): 32 min  Charges:  $Gait Training: 8-22 mins $Therapeutic Activity: 8-22 mins                    G Codes:      Rica Koyanagi  PTA [facility]  Acute  Rehab Pager      [phone]

## 2014-04-23 LAB — CBC
HCT: 23.2 % — ABNORMAL LOW (ref 36.0–46.0)
Hemoglobin: 8 g/dL — ABNORMAL LOW (ref 12.0–15.0)
MCH: 31.6 pg (ref 26.0–34.0)
MCHC: 34.5 g/dL (ref 30.0–36.0)
MCV: 91.7 fL (ref 78.0–100.0)
Platelets: 145 10*3/uL — ABNORMAL LOW (ref 150–400)
RBC: 2.53 MIL/uL — ABNORMAL LOW (ref 3.87–5.11)
RDW: 14.3 % (ref 11.5–15.5)
WBC: 10.1 10*3/uL (ref 4.0–10.5)

## 2014-04-23 LAB — GLUCOSE, CAPILLARY
GLUCOSE-CAPILLARY: 128 mg/dL — AB (ref 70–99)
GLUCOSE-CAPILLARY: 129 mg/dL — AB (ref 70–99)
Glucose-Capillary: 128 mg/dL — ABNORMAL HIGH (ref 70–99)

## 2014-04-23 LAB — PREPARE RBC (CROSSMATCH)

## 2014-04-23 MED ORDER — SODIUM CHLORIDE 0.9 % IV BOLUS (SEPSIS)
500.0000 mL | Freq: Once | INTRAVENOUS | Status: AC
  Administered 2014-04-23: 500 mL via INTRAVENOUS

## 2014-04-23 MED ORDER — SODIUM CHLORIDE 0.9 % IV SOLN
Freq: Once | INTRAVENOUS | Status: DC
Start: 2014-04-23 — End: 2014-04-25

## 2014-04-23 NOTE — Progress Notes (Signed)
Clinical Social Work Department BRIEF PSYCHOSOCIAL ASSESSMENT 04/23/2014  Patient:  Rachel Black, Rachel Black     Account Number:  [id number]     Admit date:  04/20/2014  Clinical Social Worker:  Lacie Scotts  Date/Time:  04/23/2014 12:13 PM  Referred by:  Physician  Date Referred:  04/23/2014 Referred for  SNF Placement   Other Referral:   Interview type:  Family Other interview type:    PSYCHOSOCIAL DATA Living Status:  HUSBAND Admitted from facility:   Level of care:   Primary support name:  [name] Primary support relationship to patient:  SPOUSE Degree of support available:   supportive    CURRENT CONCERNS Current Concerns  Post-Acute Placement   Other Concerns:    SOCIAL WORK ASSESSMENT / PLAN Pt is a 63 yr old female living at home prior to hospitalization. CSW met with pt / spouse to assist with d/c planning. This is a planned admission. Pt was hoping to return home following hospital d/c but PT is recommending ST Rehab. Pt / spouse are in agreement with this plan. SNF search has been initiated and bed offers provided. CSW has encouraged touring SNfs to assist with decision making. Pt/spouse have chosen [facility] for [employer]. Pt has BCBS out of [employer] which requires prior authorization. SNF has requested authorization and a decision is pending.   Assessment/plan status:  Psychosocial Support/Ongoing Assessment of Needs Other assessment/ plan:   Information/referral to community resources:   Insurance coverage for SNF and ambulance authorization has been requested.    PATIENT'S/FAMILY'S RESPONSE TO PLAN OF CARE: Pt has been very tired. CBC will be checked in am and a transfusion may be needed. She is progressing slowly . Both pt / spouse feel a short time in rehab will be beneficial.    Rachel Lean LCSW [phone]

## 2014-04-23 NOTE — Progress Notes (Signed)
Subjective: 3 Days Post-Op Procedure(s) (LRB): RIGHT TOTAL KNEE ARTHROPLASTY (Right) Patient reports pain as moderate.  Very poor mobility.  Hgb down to 8.0.  Objective: Vital signs in last 24 hours: Temp:  [98.2 F (36.8 C)-99.2 F (37.3 C)] 99.2 F (37.3 C) (08/10 0512) Pulse Rate:  [103-114] 106 (08/10 0512) Resp:  [16] 16 (08/10 0512) BP: (111-139)/(47-61) 136/61 mmHg (08/10 0512) SpO2:  [93 %-94 %] 94 % (08/10 0512)  Intake/Output from previous day: 08/09 0701 - 08/10 0700 In: 840 [P.O.:840] Out: 100 [Urine:100] Intake/Output this shift:     Recent Labs  04/21/14 0530 04/22/14 0415 04/23/14 0440  HGB 10.3* 9.0* 8.0*    Recent Labs  04/22/14 0415 04/23/14 0440  WBC 9.8 10.1  RBC 2.95* 2.53*  HCT 27.2* 23.2*  PLT 130* 145*    Recent Labs  04/21/14 0530  NA 136*  K 4.4  CL 99  CO2 21  BUN 16  CREATININE 0.95  GLUCOSE 167*  CALCIUM 9.0   No results found for this basename: LABPT, INR,  in the last 72 hours  Sensation intact distally Intact pulses distally Incision: scant drainage No cellulitis present Compartment soft Weak right foot dorsiflexion   Assessment/Plan: 3 Days Post-Op Procedure(s) (LRB): RIGHT TOTAL KNEE ARTHROPLASTY (Right) Up with therapy Discharge to SNF next 1-2 days Need to repeat CBC in am; may need transfusion.  Rachel Black Y 04/23/2014, 7:11 AM

## 2014-04-23 NOTE — Progress Notes (Signed)
Physical Therapy Treatment Patient Details Name: Rachel Black MRN: [id number] DOB: 1951/04/20 Today's Date: 04/23/2014    History of Present Illness Pt is a 63 year old female s/p R TKA with hx of morbid obesity, DM, HTN, gout, SOB    PT Comments    Pt attempted to ambulate however only able to take a few steps.  Pt then assisted to recliner and performed exercises.  Follow Up Recommendations  SNF     Equipment Recommendations  Rolling walker with 5" wheels (wide)    Recommendations for Other Services       Precautions / Restrictions Precautions Precautions: Fall;Knee Required Braces or Orthoses: Knee Immobilizer - Right Restrictions Other Position/Activity Restrictions: WBAT    Mobility  Bed Mobility Overal bed mobility: Needs Assistance Bed Mobility: Supine to Sit     Supine to sit: Min assist;HOB elevated     General bed mobility comments: verbal cues for technique, assist for R LE  Transfers Overall transfer level: Needs assistance Equipment used: Rolling walker (2 wheeled) Transfers: Sit to/from Stand Sit to Stand: +2 physical assistance;+2 safety/equipment;From elevated surface;Mod assist         General transfer comment: bed elevated, verbal cues for technique including UE and LE management  Ambulation/Gait Ambulation/Gait assistance: +2 physical assistance;+2 safety/equipment;Mod assist Ambulation Distance (Feet): 3 Feet Assistive device: Rolling walker (2 wheeled) Gait Pattern/deviations: Step-to pattern;Wide base of support;Trunk flexed     General Gait Details: verbal cues for sequence, RW positioning, posture; pt only able to take several small steps and then requested to sit so recliner brought behind pt   Stairs            Wheelchair Mobility    Modified Rankin (Stroke Patients Only)       Balance                                    Cognition Arousal/Alertness: Awake/alert Behavior During Therapy: WFL for  tasks assessed/performed Overall Cognitive Status: Within Functional Limits for tasks assessed                      Exercises Total Joint Exercises Ankle Circles/Pumps: AROM;15 reps;Both;Supine Quad Sets: AROM;15 reps;Supine;Both Towel Squeeze: AROM;Both;10 reps;Supine Short Arc Quad: AAROM;10 reps;Supine;Right Heel Slides: AAROM;Right;10 reps;Supine Hip ABduction/ADduction: AAROM;10 reps;Right;Supine    General Comments        Pertinent Vitals/Pain Pain Assessment: 0-10 Pain Score: 5  Pain Location: R knee Pain Descriptors / Indicators: Aching Pain Intervention(s): Limited activity within patient's tolerance;Monitored during session;Repositioned;Ice applied    Home Living                      Prior Function            PT Goals (current goals can now be found in the care plan section) Progress towards PT goals: Progressing toward goals    Frequency  7X/week    PT Plan Current plan remains appropriate    Co-evaluation             End of Session Equipment Utilized During Treatment: Gait belt;Right knee immobilizer Activity Tolerance: Patient limited by fatigue Patient left: with call bell/phone within reach;with family/visitor present;in chair     Time: 2119-4174 PT Time Calculation (min): 24 min  Charges:  $Gait Training: 8-22 mins $Therapeutic Exercise: 8-22 mins  G Codes:      Ossie Yebra,KATHrine E May 21, 2014, 12:30 PM Carmelia Bake, PT, DPT 2014/05/21 Pager: [phone]

## 2014-04-23 NOTE — Progress Notes (Signed)
Pt hypotensive, pale, and weak. Called Dr. Ninfa Linden to make him aware. Orders to transfuse blood given.

## 2014-04-23 NOTE — Progress Notes (Signed)
Clinical Social Work Department CLINICAL SOCIAL WORK PLACEMENT NOTE 04/23/2014  Patient:  Rachel Black, Rachel Black  Account Number:  [id number] Admit date:  04/20/2014  Clinical Social Worker:  Werner Lean, LCSW  Date/time:  04/23/2014 12:33 PM  Clinical Social Work is seeking post-discharge placement for this patient at the following level of care:   SKILLED NURSING   (*CSW will update this form in Epic as items are completed)   04/23/2014  Patient/family provided with [place] Department of Clinical Social Work's list of facilities offering this level of care within the geographic area requested by the patient (or if unable, by the patient's family).  04/23/2014  Patient/family informed of their freedom to choose among providers that offer the needed level of care, that participate in Medicare, Medicaid or managed care program needed by the patient, have an available bed and are willing to accept the patient.    Patient/family informed of [facility]' ownership interest in [facility], as well as of the fact that they are under no obligation to receive care at this facility.  PASARR submitted to EDS on 04/23/2014 PASARR number received on 04/23/2014  FL2 transmitted to all facilities in geographic area requested by pt/family on  04/23/2014 FL2 transmitted to all facilities within larger geographic area on   Patient informed that his/her managed care company has contracts with or will negotiate with  certain facilities, including the following:     Patient/family informed of bed offers received:  04/23/2014 Patient chooses bed at [facility] Physician recommends and patient chooses bed at    Patient to be transferred to  on   Patient to be transferred to facility by  Patient and family notified of transfer on  Name of family member notified:    The following physician request were entered in Epic:   Additional Comments:  Werner Lean LCSW  [phone]

## 2014-04-24 ENCOUNTER — Encounter (HOSPITAL_COMMUNITY): Payer: Self-pay | Admitting: Orthopaedic Surgery

## 2014-04-24 LAB — TYPE AND SCREEN
ABO/RH(D): A POS
ANTIBODY SCREEN: NEGATIVE
UNIT DIVISION: 0
UNIT DIVISION: 0

## 2014-04-24 LAB — GLUCOSE, CAPILLARY
GLUCOSE-CAPILLARY: 119 mg/dL — AB (ref 70–99)
GLUCOSE-CAPILLARY: 148 mg/dL — AB (ref 70–99)
Glucose-Capillary: 121 mg/dL — ABNORMAL HIGH (ref 70–99)
Glucose-Capillary: 130 mg/dL — ABNORMAL HIGH (ref 70–99)

## 2014-04-24 LAB — CBC
HCT: 25.3 % — ABNORMAL LOW (ref 36.0–46.0)
HEMOGLOBIN: 8.6 g/dL — AB (ref 12.0–15.0)
MCH: 30.8 pg (ref 26.0–34.0)
MCHC: 34 g/dL (ref 30.0–36.0)
MCV: 90.7 fL (ref 78.0–100.0)
PLATELETS: 129 10*3/uL — AB (ref 150–400)
RBC: 2.79 MIL/uL — ABNORMAL LOW (ref 3.87–5.11)
RDW: 14.3 % (ref 11.5–15.5)
WBC: 8 10*3/uL (ref 4.0–10.5)

## 2014-04-24 MED ORDER — RIVAROXABAN 10 MG PO TABS: 10.0000 mg | ORAL_TABLET | Freq: Every day | ORAL | Status: DC

## 2014-04-24 MED ORDER — METHOCARBAMOL 500 MG PO TABS: 500.0000 mg | ORAL_TABLET | Freq: Four times a day (QID) | ORAL | Status: DC | PRN

## 2014-04-24 NOTE — Discharge Summary (Signed)
Patient ID: Rachel Black MRN: [id number] DOB/AGE: 06-11-1951 63 y.o.  Admit date: 04/20/2014 Discharge date: 04/24/2014  Admission Diagnoses:  Principal Problem:   Arthritis of knee, right Active Problems:   Morbid obesity   Status post total right knee replacement   Discharge Diagnoses:  Same  Past Medical History  Diagnosis Date  . Hyperlipidemia   . Hypertension   . Gout     NO RECENT FLARE UPS  . GERD (gastroesophageal reflux disease)   . Shortness of breath     WITH EXERTION  . Diabetes mellitus     ORAL MEDICATION  . Arthritis     PAIN AND OA BOTH KNEES AND SHOULDERS AND ELBOWS AND WRIST  . H/O: rheumatic fever     AS A CHILD - NO KNOWN HEART MURMUR OR HEART PROBLEMS    Surgeries: Procedure(s): RIGHT TOTAL KNEE ARTHROPLASTY on 04/20/2014   Consultants:    Discharged Condition: Improved  Hospital Course: Rachel Black is an 63 y.o. female who was admitted 04/20/2014 for operative treatment ofArthritis of knee, right. Patient has severe unremitting pain that affects sleep, daily activities, and work/hobbies. After pre-op clearance the patient was taken to the operating room on 04/20/2014 and underwent  Procedure(s): RIGHT TOTAL KNEE ARTHROPLASTY.    Patient was given perioperative antibiotics: Anti-infectives   Start     Dose/Rate Route Frequency Ordered Stop   04/21/14 0600  clindamycin (CLEOCIN) IVPB 900 mg     900 mg 100 mL/hr over 30 Minutes Intravenous On call to O.R. 04/20/14 1205 04/20/14 1436   04/20/14 2100  clindamycin (CLEOCIN) IVPB 600 mg     600 mg 100 mL/hr over 30 Minutes Intravenous Every 6 hours 04/20/14 2036 04/21/14 0248       Patient was given sequential compression devices, early ambulation, and chemoprophylaxis to prevent DVT.  Patient benefited maximally from hospital stay and there were no complications.    Recent vital signs: Patient Vitals for the past 24 hrs:  BP Temp Temp src Pulse Resp SpO2  04/24/14 0502 150/40 mmHg 98.6 F  (37 C) Oral 87 18 98 %  04/24/14 0033 111/55 mmHg 99.2 F (37.3 C) Oral 57 18 96 %  04/23/14 2358 110/42 mmHg 99.4 F (37.4 C) Oral 86 18 95 %  04/23/14 2319 114/44 mmHg 99.3 F (37.4 C) Oral 83 20 96 %  04/23/14 2200 124/44 mmHg 98.3 F (36.8 C) Oral 92 20 100 %  04/23/14 2015 98/43 mmHg 98.8 F (37.1 C) Oral 91 - 96 %  04/23/14 1958 93/43 mmHg 99 F (37.2 C) Oral 94 20 83 %  04/23/14 1859 96/39 mmHg 98.9 F (37.2 C) Oral 102 - 95 %  04/23/14 1759 102/41 mmHg 98.7 F (37.1 C) Oral 103 - 95 %  04/23/14 1743 99/44 mmHg 97.9 F (36.6 C) Oral 98 - 100 %  04/23/14 1535 83/45 mmHg - - 105 - -  04/23/14 1353 84/46 mmHg 98 F (36.7 C) Oral 112 16 94 %     Recent laboratory studies:  Recent Labs  04/23/14 0440 04/24/14 0420  WBC 10.1 8.0  HGB 8.0* 8.6*  HCT 23.2* 25.3*  PLT 145* 129*     Discharge Medications:     Medication List    STOP taking these medications       aspirin EC 81 MG tablet      TAKE these medications       allopurinol 300 MG tablet  Commonly known as:  ZYLOPRIM  Take 300 mg by mouth 2 (two) times daily.     calcium carbonate 500 MG chewable tablet  Commonly known as:  TUMS - dosed in mg elemental calcium  Chew 2 tablets by mouth 3 (three) times daily as needed for indigestion or heartburn.     calcium-vitamin D 500-200 MG-UNIT per tablet  Commonly known as:  OSCAL WITH D  Take 1 tablet by mouth 2 (two) times daily.     dexlansoprazole 60 MG capsule  Commonly known as:  DEXILANT  Take 1 capsule (60 mg total) by mouth every morning.     glucose blood test strip  Use as instructed     JANUMET XR [phone] MG Tb24  Generic drug:  SitaGLIPtin-MetFORMIN HCl  Take 1 tablet by mouth at bedtime.     methocarbamol 500 MG tablet  Commonly known as:  ROBAXIN  Take 1 tablet (500 mg total) by mouth every 6 (six) hours as needed for muscle spasms.     ONETOUCH DELICA LANCETS FINE Misc  Check glucose qd     oxyCODONE-acetaminophen 5-325 MG per  tablet  Commonly known as:  ROXICET  Take 1-2 tablets by mouth every 4 (four) hours as needed for severe pain.     rivaroxaban 10 MG Tabs tablet  Commonly known as:  XARELTO  Take 1 tablet (10 mg total) by mouth daily with breakfast.     SIMCOR 1000-40 MG Tb24  Generic drug:  Niacin-Simvastatin  Take 1 tablet by mouth at bedtime.     trolamine salicylate 10 % cream  Commonly known as:  ASPERCREME  Apply 1 application topically 2 (two) times daily as needed for muscle pain.     valsartan-hydrochlorothiazide 160-25 MG per tablet  Commonly known as:  DIOVAN-HCT  Take 1 tablet by mouth every morning.        Diagnostic Studies: Dg Chest 2 View  04/13/2014   CLINICAL DATA:  Preoperative knee replacement; hypertension  EXAM: CHEST  2 VIEW  COMPARISON:  None.  FINDINGS: Lungs are clear. Heart size and pulmonary vascularity are normal. No adenopathy. There is mild degenerative change in the thoracic spine.  IMPRESSION: No edema or consolidation.   Electronically Signed   By: Lowella Grip M.D.   On: 04/13/2014 15:47   Dg Knee Right Port  04/20/2014   CLINICAL DATA:  Right knee replacement surgery.  EXAM: PORTABLE RIGHT KNEE - 1-2 VIEW  COMPARISON:  None.  FINDINGS: The femoral and tibial components are well seated. No complicating features are identified.  IMPRESSION: Well seated components of a total right knee arthroplasty.   Electronically Signed   By: Kalman Jewels M.D.   On: 04/20/2014 18:44    Disposition: to skilled nursing facility      Discharge Instructions   Call MD / Call 911    Complete by:  As directed   If you experience chest pain or shortness of breath, CALL 911 and be transported to the hospital emergency room.  If you develope a fever above 101 F, pus (white drainage) or increased drainage or redness at the wound, or calf pain, call your surgeon's office.     Constipation Prevention    Complete by:  As directed   Drink plenty of fluids.  Prune juice may be  helpful.  You may use a stool softener, such as Colace (over the counter) 100 mg twice a day.  Use MiraLax (over the counter) for constipation as needed.     Diet -  low sodium heart healthy    Complete by:  As directed      Discharge instructions    Complete by:  As directed   Increase activities as comfort allows. Full weight bearing on right knee and work on knee range-of-motion. Can get current right knee dressing wet in the shower daily. Can start getting actual incision wet daily and change new dry dressing daily starting 04/27/14.     Discharge patient    Complete by:  As directed      Increase activity slowly as tolerated    Complete by:  As directed      Weight bearing as tolerated    Complete by:  As directed            Follow-up Information   Follow up with Mcarthur Rossetti, MD In 2 weeks.   Specialty:  Orthopedic Surgery   Contact information:   [place] [address] [phone]        Signed: Mcarthur Rossetti 04/24/2014, 7:11 AM

## 2014-04-24 NOTE — Progress Notes (Signed)
Physical Therapy Treatment Patient Details Name: Rachel Black MRN: [id number] DOB: [birth year] Today's Date: 04/24/2014    History of Present Illness Pt is a 63 year old female s/p R TKA with hx of morbid obesity, DM, HTN, gout, SOB    PT Comments    PM session.  Assisted out of recliner tyo BSC.  Assisted with hygiene then pt was only able to take a few backward steps to bed as her other knee was "giving way".  Max c/o general weakness/fatigue with reports of poor po intake.  Assisted back to bed and positioned to comfort with increased time.  Pt progressing slowly and will need ST Rehab at SNF.  Follow Up Recommendations  SNF     Equipment Recommendations       Recommendations for Other Services       Precautions / Restrictions Precautions Precautions: Fall;Knee Restrictions Other Position/Activity Restrictions: WBAT    Mobility  Bed Mobility  Max assist back to bed             General bed mobility comments: Pt OOB in recliner  Transfers Overall transfer level: Needs assistance Equipment used: Rolling walker (2 wheeled) Transfers: Sit to/from Stand Sit to Stand: Min assist         General transfer comment: Assist to rise and steady  Ambulation/Gait Ambulation/Gait assistance: Mod assist Ambulation Distance (Feet): 2 Feet Assistive device: Rolling walker (2 wheeled) Gait Pattern/deviations: Step-to pattern;Trunk flexed Gait velocity: decreased   General Gait Details: 25% VC's on proper walker to self distance. Pt was only able to take a few backward steps to get back to bed.   Stairs            Wheelchair Mobility    Modified Rankin (Stroke Patients Only)       Balance                                    Cognition Arousal/Alertness: Awake/alert Behavior During Therapy: WFL for tasks assessed/performed Overall Cognitive Status: Within Functional Limits for tasks assessed                      Exercises       General Comments        Pertinent Vitals/Pain Pain Score: 5  Pain Location: R knee Pain Descriptors / Indicators: Aching Pain Intervention(s): Repositioned (removed ice)    Home Living                      Prior Function            PT Goals (current goals can now be found in the care plan section) Progress towards PT goals: Progressing toward goals    Frequency  7X/week    PT Plan      Co-evaluation             End of Session Equipment Utilized During Treatment: Gait belt Activity Tolerance: Patient limited by fatigue Patient left: with call bell/phone within reach;with family/visitor present;in chair     Time: 1530-1556 PT Time Calculation (min): 26 min  Charges:  $Gait Training: 8-22 mins $Therapeutic Activity: 8-22 mins                    G Codes:      Rica Koyanagi  PTA [facility]  Acute  Rehab Pager      [phone]

## 2014-04-24 NOTE — Progress Notes (Signed)
CSW assisting with d/c planning. [facility] contacted. SNF has not yet received authorization from [place] out of state [facility] ). SNF will contact CSW once authorization has been received.   Werner Lean LCSW [phone]

## 2014-04-24 NOTE — Progress Notes (Signed)
Physical Therapy Treatment Patient Details Name: Rachel Black MRN: [id number] DOB: 1950/12/11 Today's Date: 04/24/2014    History of Present Illness Pt is a 63 year old female s/p R TKA with hx of morbid obesity, DM, HTN, gout, SOB    PT Comments    POD # 4 assisted pt out of recliner to [facility].  Assisted with hygiene.  Amb limited distance.  Pt grogressing slowly and will need ST Rehab at SNF.  Follow Up Recommendations  SNF     Equipment Recommendations       Recommendations for Other Services       Precautions / Restrictions      Mobility  Bed Mobility               General bed mobility comments: Pt OOB in recliner  Transfers Overall transfer level: Needs assistance Equipment used: Rolling walker (2 wheeled) Transfers: Sit to/from Stand Sit to Stand: +2 safety/equipment;Mod assist         General transfer comment: D/C KI and pt was better able to perform sit to stand with B knee flexed.  Pt required increased time and 50% VC's on proper safe hand placement.    Ambulation/Gait Ambulation/Gait assistance: Mod assist Ambulation Distance (Feet): 11 Feet Assistive device: Rolling walker (2 wheeled) Gait Pattern/deviations: Step-to pattern;Trunk flexed Gait velocity: decreased   General Gait Details: 25% VC's on proper walker to self distance.  Pt required increased time and recliner to follow for safety.     Stairs            Wheelchair Mobility    Modified Rankin (Stroke Patients Only)       Balance                                    Cognition                            Exercises      General Comments        Pertinent Vitals/Pain      Home Living                      Prior Function            PT Goals (current goals can now be found in the care plan section) Progress towards PT goals: Progressing toward goals    Frequency  7X/week    PT Plan      Co-evaluation              End of Session Equipment Utilized During Treatment: Gait belt Activity Tolerance: Patient limited by fatigue Patient left: with call bell/phone within reach;with family/visitor present;in chair     Time: 8185-6314 PT Time Calculation (min): 39 min  Charges:  $Gait Training: 8-22 mins $Therapeutic Exercise: 8-22 mins $Therapeutic Activity: 8-22 mins                    G Codes:      Rica Koyanagi  PTA [facility]  Acute  Rehab Pager      [phone]

## 2014-04-24 NOTE — Progress Notes (Signed)
CSW assisting with d/c planning. [facility] contacted. Still no decision from [facility]   Werner Lean LCSW [phone]

## 2014-04-24 NOTE — Progress Notes (Signed)
Occupational Therapy Treatment Patient Details Name: Rachel Black MRN: [id number] DOB: 14-Feb-1951 Today's Date: 04/24/2014    History of present illness Pt is a 63 year old female s/p R TKA with hx of morbid obesity, DM, HTN, gout, SOB   OT comments  Pt making good progress in OT.  Would continue to benefit from skilled OT follow up in SNF.  Follow Up Recommendations  SNF    Equipment Recommendations  3 in 1 bedside comode    Recommendations for Other Services      Precautions / Restrictions Precautions Precautions: Fall;Knee Restrictions Other Position/Activity Restrictions: WBAT       Mobility Bed Mobility               General bed mobility comments: Pt OOB in recliner  Transfers Overall transfer level: Needs assistance Equipment used: Rolling walker (2 wheeled) Transfers: Sit to/from Stand Sit to Stand: Min assist         General transfer comment: Assist to rise and steady    Balance                                   ADL                           Toilet Transfer: BSC;Minimal assistance;Stand-pivot   Toileting- Clothing Manipulation and Hygiene: Total assistance;Sit to/from stand         General ADL Comments: pt is not interested in AE.  Husband will assist her.        Vision                     Perception     Praxis      Cognition   Behavior During Therapy: WFL for tasks assessed/performed Overall Cognitive Status: Within Functional Limits for tasks assessed                       Extremity/Trunk Assessment               Exercises     Shoulder Instructions       General Comments      Pertinent Vitals/ Pain       Pain Score: 5  Pain Location: R knee Pain Descriptors / Indicators: Aching Pain Intervention(s): Repositioned (removed ice)  Home Living                                          Prior Functioning/Environment              Frequency Min  2X/week     Progress Toward Goals  OT Goals(current goals can now be found in the care plan section)  Progress towards OT goals: Progressing toward goals     Plan      Co-evaluation                 End of Session     Activity Tolerance Patient limited by fatigue   Patient Left in chair;with call bell/phone within reach   Nurse Communication          Time: 7846-9629 OT Time Calculation (min): 21 min  Charges: OT General Charges $OT Visit: 1 Procedure OT Treatments $Self Care/Home Management : 8-22 mins  Yanira Tolsma  04/24/2014, 2:37 PM  Lesle Chris, OTR/L [phone] 04/24/2014

## 2014-04-24 NOTE — Progress Notes (Signed)
CSW has contacted [facility]. Still no authorization from [facility]. CSW will continue to follow. Dorian Pod, from [facility], met with pt / spouse this afternoon to answer questions regarding SNF.  Werner Lean LCSW [phone]

## 2014-04-24 NOTE — Progress Notes (Signed)
Subjective: 4 Days Post-Op Procedure(s) (LRB): RIGHT TOTAL KNEE ARTHROPLASTY (Right) Patient reports pain as moderate.  Slow mobility with therapy.  H/H up with transfusion.  Vitals stable.  Objective: Vital signs in last 24 hours: Temp:  [97.9 F (36.6 C)-99.4 F (37.4 C)] 98.6 F (37 C) (08/11 0502) Pulse Rate:  [57-112] 87 (08/11 0502) Resp:  [16-20] 18 (08/11 0502) BP: (83-150)/(39-55) 150/40 mmHg (08/11 0502) SpO2:  [83 %-100 %] 98 % (08/11 0502)  Intake/Output from previous day: 08/10 0701 - 08/11 0700 In: 1497.5 [P.O.:360; Blood:637.5; IV Piggyback:500] Out: 900 [Urine:900] Intake/Output this shift:     Recent Labs  04/22/14 0415 04/23/14 0440 04/24/14 0420  HGB 9.0* 8.0* 8.6*    Recent Labs  04/23/14 0440 04/24/14 0420  WBC 10.1 8.0  RBC 2.53* 2.79*  HCT 23.2* 25.3*  PLT 145* 129*   No results found for this basename: NA, K, CL, CO2, BUN, CREATININE, GLUCOSE, CALCIUM,  in the last 72 hours No results found for this basename: LABPT, INR,  in the last 72 hours  Sensation intact distally Intact pulses distally Incision: scant drainage No cellulitis present Compartment soft Right foot weak, but improved.   Assessment/Plan: 4 Days Post-Op Procedure(s) (LRB): RIGHT TOTAL KNEE ARTHROPLASTY (Right) Discharge to SNF today if bed available.  Edge Mauger Y 04/24/2014, 7:06 AM

## 2014-04-25 LAB — GLUCOSE, CAPILLARY
Glucose-Capillary: 120 mg/dL — ABNORMAL HIGH (ref 70–99)
Glucose-Capillary: 125 mg/dL — ABNORMAL HIGH (ref 70–99)
Glucose-Capillary: 111 mg/dL — ABNORMAL HIGH (ref 70–99)

## 2014-04-25 NOTE — Discharge Summary (Signed)
  Patient's condition remains unchanged and she is stable for discharge to skilled nursing facility. Awaiting approval from insurance for discharge to SNF.

## 2014-04-25 NOTE — Progress Notes (Signed)
Physical Therapy Treatment Patient Details Name: Rachel Black MRN: [id number] DOB: [birth year] Today's Date: 04/25/2014    History of Present Illness Pt is a 63 year old female s/p R TKA with hx of morbid obesity, DM, HTN, gout, SOB    PT Comments    Pt progressing slowly and c/o MAX fatigue and overall weakness.  Assisted with amb twice with one long sitting rest break.  Also assisted on/off BSC.  Pt moving slowly.  Assisted back to bed. Pt will need ST Rehab at SNF as she is unable to return at current lobility level.  Follow Up Recommendations  SNF     Equipment Recommendations  Rolling walker with 5" wheels    Recommendations for Other Services       Precautions / Restrictions Precautions Precautions: Fall;Knee Precaution Comments: pt no longer requires use of KI Restrictions Weight Bearing Restrictions: No Other Position/Activity Restrictions: WBAT    Mobility  Bed Mobility Overal bed mobility: Needs Assistance Bed Mobility: Sit to Supine       Sit to supine: Max assist   General bed mobility comments: assist B LE's up onto bed  Transfers Overall transfer level: Needs assistance Equipment used: Rolling walker (2 wheeled) Transfers: Sit to/from Stand Sit to Stand: Mod assist         General transfer comment: increased assist to rise as pt c/o increased fatigue and overall weakness.  Ambulation/Gait Ambulation/Gait assistance: Min assist Ambulation Distance (Feet): 18 Feet Assistive device: Rolling walker (2 wheeled) Gait Pattern/deviations: Step-to pattern;Trunk flexed Gait velocity: decreased   General Gait Details: limited amb distance due to MAX c/o fatigue and overall weakness.   Stairs            Wheelchair Mobility    Modified Rankin (Stroke Patients Only)       Balance                                    Cognition                            Exercises      General Comments        Pertinent  Vitals/Pain      Home Living                      Prior Function            PT Goals (current goals can now be found in the care plan section) Progress towards PT goals: Progressing toward goals    Frequency  7X/week    PT Plan Current plan remains appropriate    Co-evaluation             End of Session Equipment Utilized During Treatment: Gait belt Activity Tolerance: Patient limited by fatigue Patient left: in bed;with call bell/phone within reach     Time: 1130-1205 PT Time Calculation (min): 35 min  Charges:  $Gait Training: 8-22 mins $Therapeutic Activity: 8-22 mins                    G Codes:     Rica Koyanagi  PTA [facility]  Acute  Rehab Pager      [phone]

## 2014-04-25 NOTE — Progress Notes (Signed)
CSW assisting with d/c planning. [facility] contacted. They are still waiting for authorization for SNF placement.  Werner Lean LCSW [phone]

## 2014-04-25 NOTE — Progress Notes (Signed)
Clinical Social Work Department CLINICAL SOCIAL WORK PLACEMENT NOTE 04/25/2014  Patient:  Rachel Black, Rachel Black  Account Number:  [id number] Admit date:  04/20/2014  Clinical Social Worker:  Werner Lean, LCSW  Date/time:  04/23/2014 12:33 PM  Clinical Social Work is seeking post-discharge placement for this patient at the following level of care:   SKILLED NURSING   (*CSW will update this form in Epic as items are completed)   04/23/2014  Patient/family provided with [facility] Department of Clinical Social Work's list of facilities offering this level of care within the geographic area requested by the patient (or if unable, by the patient's family).  04/23/2014  Patient/family informed of their freedom to choose among providers that offer the needed level of care, that participate in Medicare, Medicaid or managed care program needed by the patient, have an available bed and are willing to accept the patient.    Patient/family informed of [facility]' ownership interest in [facility], as well as of the fact that they are under no obligation to receive care at this facility.  PASARR submitted to EDS on 04/23/2014 PASARR number received on 04/23/2014  FL2 transmitted to all facilities in geographic area requested by pt/family on  04/23/2014 FL2 transmitted to all facilities within larger geographic area on   Patient informed that his/her managed care company has contracts with or will negotiate with  certain facilities, including the following:     Patient/family informed of bed offers received:  04/23/2014 Patient chooses bed at [facility] Physician recommends and patient chooses bed at    Patient to be transferred to [facility] on  04/25/2014 Patient to be transferred to facility by EMS Patient and family notified of transfer on 04/25/2014 Name of family member notified:  SPOUSE  The following physician request were entered in  Epic:   Additional Comments: Pt / spouse are in agreement with d/c to SNF via EMS transport. NSG reviewed d/c summary,scripts,avs. Scripts are included in d/c packet.  Werner Lean LCSW [phone]

## 2014-04-25 NOTE — Clinical Documentation Improvement (Signed)
Possible Clinical Conditions?    Expected Acute Blood Loss Anemia  Acute Blood Loss Anemia  Postop Acute Blood Loss Anemia  Other Condition________________  Cannot Clinically Determine    Supporting Information:  Per 04/21/14 MD progress note: Post-op anemia will monitor for symptoms   Per 04/22/14 MD progress note: Monitor for symptoms of anemia    Lab Range:  Component     Latest Ref Rng 04/21/2014 04/22/2014 04/23/2014 04/24/2014  Hemoglobin     12.0 - 15.0 g/dL 10.3 (L) 9.0 (L) 8.0 (L) 8.6 (L)  HCT     36.0 - 46.0 % 30.9 (L) 27.2 (L) 23.2 (L) 25.3 (L)    Thank You, Serena Colonel ,RN Clinical Documentation Specialist:  [facility] [employer]

## 2014-04-25 NOTE — Progress Notes (Signed)
[employer] has provided authorization for SNF placement. Pt / spouse are in agreement with d/c to SNF today via [facility] transport.   Werner Lean LCSW [phone]

## 2014-04-25 NOTE — Progress Notes (Signed)
Subjective: 5 Days Post-Op Procedure(s) (LRB): RIGHT TOTAL KNEE ARTHROPLASTY CONVERTED TO RIGHT KNEE REIMPLANTATION (Right) Patient reports pain as mild.   States she is feeling a little better today. Denies chest pain, SOB, Dizziness or lightheadedness. Tolerating diet well. Objective: Vital signs in last 24 hours: Temp:  [98.4 F (36.9 C)-98.7 F (37.1 C)] 98.7 F (37.1 C) (08/12 0615) Pulse Rate:  [87-103] 87 (08/12 0615) Resp:  [16-18] 18 (08/12 0615) BP: (102-127)/(48-65) 111/51 mmHg (08/12 0615) SpO2:  [93 %-100 %] 98 % (08/12 0615)  Intake/Output from previous day: 08/11 0701 - 08/12 0700 In: 480 [P.O.:480] Out: 150 [Urine:150] Intake/Output this shift:     Recent Labs  04/23/14 0440 04/24/14 0420  HGB 8.0* 8.6*    Recent Labs  04/23/14 0440 04/24/14 0420  WBC 10.1 8.0  RBC 2.53* 2.79*  HCT 23.2* 25.3*  PLT 145* 129*   No results found for this basename: NA, K, CL, CO2, BUN, CREATININE, GLUCOSE, CALCIUM,  in the last 72 hours No results found for this basename: LABPT, INR,  in the last 72 hours  Patient up bedside Alert and Oriented Incision: scant drainage Dorsiflexion right foot improving able to come to neutral.  Assessment/Plan: 5 Days Post-Op Procedure(s) (LRB): RIGHT TOTAL KNEE ARTHROPLASTY CONVERTED TO RIGHT KNEE REIMPLANTATION (Right) Up with therapy Discharge to [facility] [place] 04/25/2014, 7:38 AM

## 2014-04-26 NOTE — Progress Notes (Signed)
Patient ID: Rachel Black, female   DOB: [birth year], 63 y.o.   MRN: [id number] A transfusion was given during her hospitalization due to acute blood loss anemia related to her surgery.

## 2014-05-17 ENCOUNTER — Other Ambulatory Visit: Payer: Self-pay | Admitting: Family Medicine

## 2014-05-19 ENCOUNTER — Other Ambulatory Visit: Payer: Self-pay | Admitting: Family Medicine

## 2014-05-24 ENCOUNTER — Telehealth: Payer: Self-pay | Admitting: Family Medicine

## 2014-05-24 MED ORDER — NIACIN-SIMVASTATIN ER 1000-40 MG PO TB24: 1.0000 | ORAL_TABLET | Freq: Every day | ORAL | Status: DC

## 2014-05-24 NOTE — Telephone Encounter (Signed)
Rx sent      KP 

## 2014-05-24 NOTE — Telephone Encounter (Signed)
Pt is needing a new rx for SIMCOR 1000-40 MG TB24, pt states her insurance will not pay unless it's a 90 day supply, pt states pharmacy was sent a 30 day rx. Pt requesting a new rx for 90 day supply. Send to [facility]

## 2014-08-17 ENCOUNTER — Telehealth: Payer: Self-pay | Admitting: Family Medicine

## 2014-08-17 ENCOUNTER — Other Ambulatory Visit: Payer: Self-pay | Admitting: Family Medicine

## 2014-08-17 MED ORDER — SITAGLIP PHOS-METFORMIN HCL ER 100-1000 MG PO TB24: 1.0000 | ORAL_TABLET | Freq: Every day | ORAL | Status: DC

## 2014-08-17 NOTE — Telephone Encounter (Signed)
Caller name: Samiksha Relation to pt: self Call back number: [phone] Pharmacy: [facility] [address]  Reason for call:   Patient states that she was seen in august and need a refill of janumet

## 2014-08-17 NOTE — Telephone Encounter (Signed)
Recheck labs in 3 months.------ 272.4 lipid, hep        Dm controlled-- con't meds---250.00 Hgba1c, bmp

## 2014-11-10 ENCOUNTER — Other Ambulatory Visit: Payer: Self-pay | Admitting: Family Medicine

## 2014-11-12 NOTE — Telephone Encounter (Signed)
Janumet refill sent to pharmacy. Pt is due now for f/u of diabetes and labs.  Please call pt to arrange appt.

## 2014-11-13 ENCOUNTER — Other Ambulatory Visit: Payer: Self-pay | Admitting: Family Medicine

## 2014-11-13 NOTE — Telephone Encounter (Signed)
Informed patient of med refill and she scheduled appointment for 11/20/14

## 2014-11-20 ENCOUNTER — Ambulatory Visit (INDEPENDENT_AMBULATORY_CARE_PROVIDER_SITE_OTHER): Payer: BLUE CROSS/BLUE SHIELD | Admitting: Family Medicine

## 2014-11-20 ENCOUNTER — Encounter: Payer: Self-pay | Admitting: Family Medicine

## 2014-11-20 VITALS — BP 99/61 | HR 89 | Temp 98.2°F | Wt 279.8 lb

## 2014-11-20 DIAGNOSIS — IMO0002 Reserved for concepts with insufficient information to code with codable children: Secondary | ICD-10-CM

## 2014-11-20 DIAGNOSIS — R1030 Lower abdominal pain, unspecified: Secondary | ICD-10-CM

## 2014-11-20 DIAGNOSIS — K2100 Gastro-esophageal reflux disease with esophagitis, without bleeding: Secondary | ICD-10-CM

## 2014-11-20 DIAGNOSIS — E785 Hyperlipidemia, unspecified: Secondary | ICD-10-CM

## 2014-11-20 DIAGNOSIS — E1165 Type 2 diabetes mellitus with hyperglycemia: Secondary | ICD-10-CM

## 2014-11-20 DIAGNOSIS — R8290 Unspecified abnormal findings in urine: Secondary | ICD-10-CM

## 2014-11-20 DIAGNOSIS — I10 Essential (primary) hypertension: Secondary | ICD-10-CM

## 2014-11-20 DIAGNOSIS — M1000 Idiopathic gout, unspecified site: Secondary | ICD-10-CM

## 2014-11-20 LAB — POCT URINALYSIS DIPSTICK
BILIRUBIN UA: NEGATIVE
Blood, UA: NEGATIVE
GLUCOSE UA: NEGATIVE
KETONES UA: NEGATIVE
Nitrite, UA: NEGATIVE
Protein, UA: NEGATIVE
Urobilinogen, UA: NEGATIVE
pH, UA: 5.5

## 2014-11-20 MED ORDER — SITAGLIP PHOS-METFORMIN HCL ER 100-1000 MG PO TB24: ORAL_TABLET | ORAL | Status: DC

## 2014-11-20 MED ORDER — DEXLANSOPRAZOLE 60 MG PO CPDR: 60.0000 mg | DELAYED_RELEASE_CAPSULE | Freq: Every morning | ORAL | Status: DC

## 2014-11-20 MED ORDER — ROSUVASTATIN CALCIUM 20 MG PO TABS: 20.0000 mg | ORAL_TABLET | Freq: Every day | ORAL | Status: DC

## 2014-11-20 MED ORDER — ALLOPURINOL 300 MG PO TABS: 300.0000 mg | ORAL_TABLET | Freq: Two times a day (BID) | ORAL | Status: DC

## 2014-11-20 MED ORDER — ALIGN 4 MG PO CAPS: ORAL_CAPSULE | ORAL | Status: DC

## 2014-11-20 NOTE — Progress Notes (Signed)
Patient ID: Rachel Black, female    DOB: [birth year]  Age: 64 y.o. MRN: [id number]    Subjective:  Subjective HPI Rachel Black presents for bp, cholesterol , and htn   HPI HYPERTENSION  Blood pressure range-not checking  Chest pain- no      Dyspnea- no Lightheadedness- no   Edema- no Other side effects - no   Medication compliance: good Low salt diet- yes  DIABETES  Blood Sugar ranges-high per pt  Polyuria- no New Visual problems- no Hypoglycemic symptoms- no Other side effects-no Medication compliance - good Last eye exam- due Foot exam- today  HYPERLIPIDEMIA  Medication compliance- no RUQ pain- no  Muscle aches- no Other side effects-no   Review of Systems  Constitutional: Negative for activity change, appetite change, fatigue and unexpected weight change.  Respiratory: Negative for cough and shortness of breath.   Cardiovascular: Negative for chest pain and palpitations.  Psychiatric/Behavioral: Negative for behavioral problems and dysphoric mood. The patient is not nervous/anxious.     History Past Medical History  Diagnosis Date  . Hyperlipidemia   . Hypertension   . Gout     NO RECENT FLARE UPS  . GERD (gastroesophageal reflux disease)   . Shortness of breath     WITH EXERTION  . Diabetes mellitus     ORAL MEDICATION  . Arthritis     PAIN AND OA BOTH KNEES AND SHOULDERS AND ELBOWS AND WRIST  . H/O: rheumatic fever     AS A CHILD - NO KNOWN HEART MURMUR OR HEART PROBLEMS    She has past surgical history that includes Ventral hernia repair; Partial hysterectomy; Cesarean section; Esophagogastroduodenoscopy (egd) with propofol (N/A, 01/30/2013); Colonoscopy with propofol (N/A, 01/30/2013); Abdominal hysterectomy; and Total knee arthroplasty (Right, 04/20/2014).   Her family history includes Crohn's disease in her son; Diabetes in her father and mother; Heart disease in her father; Hyperlipidemia in her father; Hypertension in her mother; Irritable  bowel syndrome in her son; Stroke in her father.She reports that she has never smoked. She has never used smokeless tobacco. She reports that she does not drink alcohol or use illicit drugs.  Current Outpatient Prescriptions on File Prior to Visit  Medication Sig Dispense Refill  . calcium carbonate (TUMS - DOSED IN MG ELEMENTAL CALCIUM) 500 MG chewable tablet Chew 2 tablets by mouth 3 (three) times daily as needed for indigestion or heartburn.    . calcium-vitamin D (OSCAL WITH D) 500-200 MG-UNIT per tablet Take 1 tablet by mouth 2 (two) times daily.    Marland Kitchen glucose blood test strip Use as instructed 100 each 12  . methocarbamol (ROBAXIN) 500 MG tablet Take 1 tablet (500 mg total) by mouth every 6 (six) hours as needed for muscle spasms. 60 tablet 0  . ONETOUCH DELICA LANCETS FINE MISC Check glucose qd 90 each 3  . rivaroxaban (XARELTO) 10 MG TABS tablet Take 1 tablet (10 mg total) by mouth daily with breakfast. 20 tablet 0  . trolamine salicylate (ASPERCREME) 10 % cream Apply 1 application topically 2 (two) times daily as needed for muscle pain.    . valsartan-hydrochlorothiazide (DIOVAN-HCT) 160-25 MG per tablet Take 1 tablet by mouth every morning. 90 tablet 3   No current facility-administered medications on file prior to visit.     Objective:  Objective Physical Exam  Constitutional: She is oriented to person, place, and time. She appears well-developed and well-nourished. No distress.  HENT:  Right Ear: External ear normal.  Left Ear:  External ear normal.  Nose: Nose normal.  Mouth/Throat: Oropharynx is clear and moist.  Eyes: EOM are normal. Pupils are equal, round, and reactive to light.  Neck: Normal range of motion. Neck supple.  Cardiovascular: Normal rate, regular rhythm and normal heart sounds.   No murmur heard. Pulmonary/Chest: Effort normal and breath sounds normal. No respiratory distress. She has no wheezes. She has no rales. She exhibits no tenderness.  Neurological:  She is alert and oriented to person, place, and time.  Psychiatric: She has a normal mood and affect. Her behavior is normal. Judgment and thought content normal.  Sensory exam of the foot is normal, tested with the monofilament. Good pulses, no lesions or ulcers, good peripheral pulses.  BP 99/61 mmHg  Pulse 89  Temp(Src) 98.2 F (36.8 C) (Oral)  Wt 279 lb 12.8 oz (126.916 kg)  SpO2 97% Wt Readings from Last 3 Encounters:  11/20/14 279 lb 12.8 oz (126.916 kg)  04/21/14 318 lb (144.244 kg)  04/10/14 319 lb (144.697 kg)     Lab Results  Component Value Date   WBC 8.0 04/24/2014   HGB 8.6* 04/24/2014   HCT 25.3* 04/24/2014   PLT 129* 04/24/2014   GLUCOSE 167* 04/21/2014   CHOL 126 04/10/2014   TRIG 177.0* 04/10/2014   HDL 39.60 04/10/2014   LDLCALC 51 04/10/2014   ALT 35 04/10/2014   AST 34 04/10/2014   NA 136* 04/21/2014   K 4.4 04/21/2014   CL 99 04/21/2014   CREATININE 0.95 04/21/2014   BUN 16 04/21/2014   CO2 21 04/21/2014   TSH 2.26 05/26/2013   INR 0.97 04/13/2014   HGBA1C 5.9 04/10/2014   MICROALBUR 2.2* 04/10/2014    Dg Chest 2 View  04/13/2014   CLINICAL DATA:  Preoperative knee replacement; hypertension  EXAM: CHEST  2 VIEW  COMPARISON:  None.  FINDINGS: Lungs are clear. Heart size and pulmonary vascularity are normal. No adenopathy. There is mild degenerative change in the thoracic spine.  IMPRESSION: No edema or consolidation.   Electronically Signed   By: Lowella Grip M.D.   On: 04/13/2014 15:47     Assessment & Plan:  Plan I have discontinued [name]'s oxyCODONE-acetaminophen and Niacin-Simvastatin. I have changed her JANUMET XR to SitaGLIPtin-MetFORMIN HCl. I have also changed her allopurinol. Additionally, I am having her start on ALIGN. Lastly, I am having her maintain her calcium-vitamin D, calcium carbonate, trolamine salicylate, ONETOUCH DELICA LANCETS FINE, glucose blood, valsartan-hydrochlorothiazide, rivaroxaban, methocarbamol, rosuvastatin,  and dexlansoprazole.  Meds ordered this encounter  Medications  . DISCONTD: rosuvastatin (CRESTOR) 20 MG tablet    Sig: Take 1 tablet (20 mg total) by mouth daily.    Dispense:  30 tablet    Refill:  0  . DISCONTD: rosuvastatin (CRESTOR) 20 MG tablet    Sig: Take 1 tablet (20 mg total) by mouth daily.    Dispense:  90 tablet    Refill:  1  . rosuvastatin (CRESTOR) 20 MG tablet    Sig: Take 1 tablet (20 mg total) by mouth daily.    Dispense:  90 tablet    Refill:  1  . allopurinol (ZYLOPRIM) 300 MG tablet    Sig: Take 1 tablet (300 mg total) by mouth 2 (two) times daily.    Dispense:  60 tablet    Refill:  2  . dexlansoprazole (DEXILANT) 60 MG capsule    Sig: Take 1 capsule (60 mg total) by mouth every morning.    Dispense:  90  capsule    Refill:  3  . SitaGLIPtin-MetFORMIN HCl (JANUMET XR) [phone] MG TB24    Sig: TAKE 1 TABLET BY MOUTH DAILY.    Dispense:  90 tablet    Refill:  0  . Probiotic Product (ALIGN) 4 MG CAPS    Sig: 1 po qd    Dispense:  30 capsule    Refill:  5    Problem List Items Addressed This Visit    None    Visit Diagnoses    Diabetes mellitus type II, uncontrolled    -  Primary    Relevant Medications    rosuvastatin (CRESTOR) tablet    SitaGLIPtin-MetFORMIN HCl (JANUMET XR) [phone] MG TB24    Other Relevant Orders    Hemoglobin A1c    Microalbumin / creatinine urine ratio    POCT urinalysis dipstick (Completed)    Essential hypertension        Relevant Medications    rosuvastatin (CRESTOR) tablet    Other Relevant Orders    Basic metabolic panel    Hyperlipidemia LDL goal <70        Relevant Medications    rosuvastatin (CRESTOR) tablet    Other Relevant Orders    Basic metabolic panel    Hepatic function panel    Lipid panel    Gastroesophageal reflux disease with esophagitis        Relevant Medications    dexlansoprazole (DEXILANT) 60 MG capsule    Acute idiopathic gout, unspecified site        Relevant Medications    allopurinol  (ZYLOPRIM) tablet    Lower abdominal pain        Relevant Orders    Amylase    Lipase    Abnormal urine        Relevant Orders    Urine Culture       Follow-up: Return in about 6 months (around 05/23/2015), or if symptoms worsen or fail to improve, for f/u and labs.  Garnet Koyanagi, DO

## 2014-11-20 NOTE — Progress Notes (Signed)
Pre visit review using our clinic review tool, if applicable. No additional management support is needed unless otherwise documented below in the visit note. 

## 2014-11-21 LAB — AMYLASE: Amylase: 38 U/L (ref 27–131)

## 2014-11-21 LAB — HEPATIC FUNCTION PANEL
ALK PHOS: 79 U/L (ref 39–117)
ALT: 11 U/L (ref 0–35)
AST: 17 U/L (ref 0–37)
Albumin: 4.1 g/dL (ref 3.5–5.2)
BILIRUBIN TOTAL: 0.8 mg/dL (ref 0.2–1.2)
Bilirubin, Direct: 0.1 mg/dL (ref 0.0–0.3)
Total Protein: 7.1 g/dL (ref 6.0–8.3)

## 2014-11-21 LAB — MICROALBUMIN / CREATININE URINE RATIO
CREATININE, U: 136.2 mg/dL
MICROALB/CREAT RATIO: 0.5 mg/g (ref 0.0–30.0)

## 2014-11-21 LAB — LIPID PANEL
CHOL/HDL RATIO: 2
Cholesterol: 119 mg/dL (ref 0–200)
HDL: 49.1 mg/dL (ref >39.00)
LDL Cholesterol: 52 mg/dL (ref 0–99)
NonHDL: 69.9
Triglycerides: 92 mg/dL (ref 0.0–149.0)
VLDL: 18.4 mg/dL (ref 0.0–40.0)

## 2014-11-21 LAB — BASIC METABOLIC PANEL
BUN: 17 mg/dL (ref 6–23)
CO2: 26 mEq/L (ref 19–32)
Calcium: 10.5 mg/dL (ref 8.4–10.5)
Chloride: 99 mEq/L (ref 96–112)
Creatinine, Ser: 1.03 mg/dL (ref 0.40–1.20)
GFR: 57.63 mL/min — ABNORMAL LOW (ref >60.00)
GLUCOSE: 110 mg/dL — AB (ref 70–99)
Potassium: 4 mEq/L (ref 3.5–5.1)
Sodium: 136 mEq/L (ref 135–145)

## 2014-11-21 LAB — URINE CULTURE: Colony Count: >=100000

## 2014-11-21 LAB — LIPASE: Lipase: 16 U/L (ref 11.0–59.0)

## 2014-11-21 LAB — HEMOGLOBIN A1C: HEMOGLOBIN A1C: 5.8 % (ref 4.6–6.5)

## 2014-11-26 ENCOUNTER — Other Ambulatory Visit: Payer: Self-pay

## 2014-11-26 DIAGNOSIS — M1000 Idiopathic gout, unspecified site: Secondary | ICD-10-CM

## 2014-11-26 MED ORDER — ALLOPURINOL 300 MG PO TABS: 300.0000 mg | ORAL_TABLET | Freq: Two times a day (BID) | ORAL | Status: DC

## 2014-12-14 ENCOUNTER — Other Ambulatory Visit: Payer: Self-pay | Admitting: Family Medicine

## 2014-12-14 NOTE — Telephone Encounter (Signed)
Pt called back and states she does NOT want to use mail order. Will use local [facility]. Crestor refill sent.

## 2014-12-14 NOTE — Telephone Encounter (Signed)
[facility] local [facility] for [facility]ill of crestor. Rx sent for 30 day supply on 11/20/14 and 90 day [facility]. Left message for pt to return my call and verify if current request is from pt or auto request.

## 2015-02-10 ENCOUNTER — Other Ambulatory Visit: Payer: Self-pay | Admitting: Family Medicine

## 2015-03-29 ENCOUNTER — Other Ambulatory Visit: Payer: Self-pay | Admitting: Family Medicine

## 2015-06-18 ENCOUNTER — Other Ambulatory Visit: Payer: Self-pay | Admitting: Family Medicine

## 2015-06-24 ENCOUNTER — Other Ambulatory Visit: Payer: Self-pay

## 2015-06-24 DIAGNOSIS — E119 Type 2 diabetes mellitus without complications: Secondary | ICD-10-CM

## 2015-06-24 MED ORDER — GLUCOSE BLOOD VI STRP: ORAL_STRIP | Status: DC

## 2015-06-24 MED ORDER — ONETOUCH DELICA LANCETS FINE MISC: Status: DC

## 2015-06-25 ENCOUNTER — Other Ambulatory Visit: Payer: Self-pay | Admitting: Family Medicine

## 2015-07-08 ENCOUNTER — Telehealth: Payer: Self-pay | Admitting: Family Medicine

## 2015-07-08 DIAGNOSIS — I10 Essential (primary) hypertension: Secondary | ICD-10-CM

## 2015-07-08 MED ORDER — VALSARTAN-HYDROCHLOROTHIAZIDE 160-25 MG PO TABS: 1.0000 | ORAL_TABLET | Freq: Every morning | ORAL | Status: DC

## 2015-07-08 NOTE — Telephone Encounter (Signed)
Caller name: Eldena Dede   Relationship to patient: Self   Can be reached: [phone]  [id number] [place] [address] [place] [address]  Reason for call: Pt says that she need a refill on her blood pressure medication (valsartan-hydrochlorothiazide).

## 2015-07-08 NOTE — Telephone Encounter (Signed)
Rx faxed.    KP 

## 2015-08-07 ENCOUNTER — Other Ambulatory Visit: Payer: Self-pay | Admitting: Family Medicine

## 2015-08-07 NOTE — Patient Instructions (Addendum)
YOUR PROCEDURE IS SCHEDULED ON :  08/23/15 Friday  REPORT TO [facility] MAIN ENTRANCE FOLLOW SIGNS TO EAST ELEVATOR - GO TO 3rd FLOOR CHECK IN AT 3 EAST NURSES STATION (SHORT STAY) AT: 12:45 PM  CALL THIS NUMBER IF YOU HAVE PROBLEMS THE MORNING OF SURGERY [phone]  REMEMBER:ONLY 1 PER PERSON MAY GO TO SHORT STAY WITH YOU TO GET READY THE MORNING OF YOUR SURGERY  DO NOT EAT FOOD  AFTER MIDNIGHT : exception -Clear liquids(water, black coffee, tea, soft drinks, applle juice, clear broth, clear grape juice) 12 midnight to 0830 AM, then nothing.   TAKE THESE MEDICINES THE MORNING OF SURGERY:Allopurinol. Dexilant.  CLEAR LIQUID DIET  Foods Allowed                                                                     Foods Excluded  Coffee and tea, regular and decaf                             liquids that you cannot  Plain Jell-O in any flavor                                             see through such as: Fruit ices (not with fruit pulp)                                     milk, soups, orange juice  Iced Popsicles                                                    All solid food Carbonated beverages, regular and diet                                    Cranberry, grape and apple juices Sports drinks like Gatorade Lightly seasoned clear broth or consume(fat free) Sugar, honey syrup  _____________________________________________________________________    YOU MAY NOT HAVE ANY METAL ON YOUR BODY INCLUDING HAIR PINS AND PIERCING'S. DO NOT WEAR JEWELRY, MAKEUP, LOTIONS, POWDERS OR PERFUMES. DO NOT WEAR NAIL POLISH. DO NOT SHAVE 48 HRS PRIOR TO SURGERY. MEN MAY SHAVE FACE AND NECK.  DO NOT [place]. [facility] IS NOT RESPONSIBLE FOR VALUABLES.  CONTACTS, DENTURES OR PARTIALS MAY NOT BE WORN TO SURGERY. LEAVE SUITCASE IN CAR. CAN BE BROUGHT TO ROOM AFTER SURGERY.  PATIENTS DISCHARGED THE DAY OF SURGERY WILL NOT BE ALLOWED TO DRIVE HOME.  PLEASE  READ OVER THE FOLLOWING INSTRUCTION SHEETS _________________________________________________________________________________                                          [facility] - PREPARING  FOR SURGERY  Before surgery, you can play an important role.  Because skin is not sterile, your skin needs to be as free of germs as possible.  You can reduce the number of germs on your skin by washing with CHG (chlorahexidine gluconate) soap before surgery.  CHG is an antiseptic cleaner which kills germs and bonds with the skin to continue killing germs even after washing. Please DO NOT use if you have an allergy to CHG or antibacterial soaps.  If your skin becomes reddened/irritated stop using the CHG and inform your nurse when you arrive at Short Stay. Do not shave (including legs and underarms) for at least 48 hours prior to the first CHG shower.  You may shave your face. Please follow these instructions carefully:   1.  Shower with CHG Soap the night before surgery and the  morning of Surgery.   2.  If you choose to wash your hair, wash your hair first as usual with your  normal  Shampoo.   3.  After you shampoo, rinse your hair and body thoroughly to remove the  shampoo.                                         4.  Use CHG as you would any other liquid soap.  You can apply chg directly  to the skin and wash . Gently wash with scrungie or clean wascloth    5.  Apply the CHG Soap to your body ONLY FROM THE NECK DOWN.   Do not use on open                           Wound or open sores. Avoid contact with eyes, ears mouth and genitals (private parts).                        Genitals (private parts) with your normal soap.              6.  Wash thoroughly, paying special attention to the area where your surgery  will be performed.   7.  Thoroughly rinse your body with warm water from the neck down.   8.  DO NOT shower/wash with your normal soap after using and rinsing off  the CHG Soap .                 9.  Pat yourself dry with a clean towel.             10.  Wear clean night clothes to bed after shower             11.  Place clean sheets on your bed the night of your first shower and do not  sleep with pets.  Day of Surgery : Do not apply any lotions/deodorants the morning of surgery.  Please wear clean clothes to the hospital/surgery center.  FAILURE TO FOLLOW THESE INSTRUCTIONS MAY RESULT IN THE CANCELLATION OF YOUR SURGERY    PATIENT SIGNATURE_________________________________  ______________________________________________________________________

## 2015-08-12 ENCOUNTER — Other Ambulatory Visit (HOSPITAL_COMMUNITY): Payer: Self-pay | Admitting: Orthopaedic Surgery

## 2015-08-13 ENCOUNTER — Encounter (HOSPITAL_COMMUNITY): Payer: Self-pay

## 2015-08-13 ENCOUNTER — Encounter (HOSPITAL_COMMUNITY)
Admission: RE | Admit: 2015-08-13 | Discharge: 2015-08-13 | Disposition: A | Discharge status: Home / Self Care | Payer: BLUE CROSS/BLUE SHIELD | Source: Ambulatory Visit | Attending: Orthopaedic Surgery | Admitting: Orthopaedic Surgery

## 2015-08-13 DIAGNOSIS — M179 Osteoarthritis of knee, unspecified: Secondary | ICD-10-CM | POA: Diagnosis not present

## 2015-08-13 DIAGNOSIS — Z01818 Encounter for other preprocedural examination: Secondary | ICD-10-CM | POA: Diagnosis not present

## 2015-08-13 LAB — APTT: aPTT: 27 seconds (ref 24–37)

## 2015-08-13 LAB — BASIC METABOLIC PANEL
ANION GAP: 9 (ref 5–15)
BUN: 19 mg/dL (ref 6–20)
CALCIUM: 10.8 mg/dL — AB (ref 8.9–10.3)
CO2: 29 mmol/L (ref 22–32)
Chloride: 103 mmol/L (ref 101–111)
Creatinine, Ser: 1.13 mg/dL — ABNORMAL HIGH (ref 0.44–1.00)
GFR calc Af Amer: 59 mL/min — ABNORMAL LOW (ref >60)
GFR calc non Af Amer: 51 mL/min — ABNORMAL LOW (ref >60)
GLUCOSE: 128 mg/dL — AB (ref 65–99)
Potassium: 4.8 mmol/L (ref 3.5–5.1)
Sodium: 141 mmol/L (ref 135–145)

## 2015-08-13 LAB — CBC
HEMATOCRIT: 37.9 % (ref 36.0–46.0)
HEMOGLOBIN: 12.1 g/dL (ref 12.0–15.0)
MCH: 28.9 pg (ref 26.0–34.0)
MCHC: 31.9 g/dL (ref 30.0–36.0)
MCV: 90.7 fL (ref 78.0–100.0)
Platelets: 183 10*3/uL (ref 150–400)
RBC: 4.18 MIL/uL (ref 3.87–5.11)
RDW: 14.4 % (ref 11.5–15.5)
WBC: 6.6 10*3/uL (ref 4.0–10.5)

## 2015-08-13 LAB — SURGICAL PCR SCREEN
MRSA, PCR: NEGATIVE
Staphylococcus aureus: NEGATIVE

## 2015-08-13 LAB — PROTIME-INR
INR: 0.99 (ref 0.00–1.49)
PROTHROMBIN TIME: 13.3 s (ref 11.6–15.2)

## 2015-08-13 NOTE — Pre-Procedure Instructions (Signed)
08-13-15 EKG done today

## 2015-08-14 LAB — TYPE AND SCREEN
ABO/RH(D): A POS
ANTIBODY SCREEN: NEGATIVE

## 2015-08-14 LAB — ABO/RH: ABO/RH(D): A POS

## 2015-08-14 LAB — HEMOGLOBIN A1C
Hgb A1c MFr Bld: 6.2 % — ABNORMAL HIGH (ref 4.8–5.6)
Mean Plasma Glucose: 131 mg/dL

## 2015-08-23 ENCOUNTER — Encounter (HOSPITAL_COMMUNITY)
Admission: RE | Disposition: A | Discharge status: Skilled Nursing Facility | Payer: Self-pay | Source: Ambulatory Visit | Attending: Orthopaedic Surgery

## 2015-08-23 ENCOUNTER — Encounter (HOSPITAL_COMMUNITY): Payer: Self-pay | Admitting: *Deleted

## 2015-08-23 ENCOUNTER — Inpatient Hospital Stay (HOSPITAL_COMMUNITY): Payer: BLUE CROSS/BLUE SHIELD | Admitting: Registered Nurse

## 2015-08-23 ENCOUNTER — Inpatient Hospital Stay (HOSPITAL_COMMUNITY)
Admission: RE | Admit: 2015-08-23 | Discharge: 2015-08-26 | DRG: 470 | Disposition: A | Discharge status: Skilled Nursing Facility | Payer: BLUE CROSS/BLUE SHIELD | Source: Ambulatory Visit | Attending: Orthopaedic Surgery | Admitting: Orthopaedic Surgery

## 2015-08-23 ENCOUNTER — Inpatient Hospital Stay (HOSPITAL_COMMUNITY): Payer: BLUE CROSS/BLUE SHIELD

## 2015-08-23 DIAGNOSIS — E119 Type 2 diabetes mellitus without complications: Secondary | ICD-10-CM | POA: Diagnosis present

## 2015-08-23 DIAGNOSIS — M1712 Unilateral primary osteoarthritis, left knee: Principal | ICD-10-CM | POA: Diagnosis present

## 2015-08-23 DIAGNOSIS — Z79899 Other long term (current) drug therapy: Secondary | ICD-10-CM

## 2015-08-23 DIAGNOSIS — E785 Hyperlipidemia, unspecified: Secondary | ICD-10-CM | POA: Diagnosis present

## 2015-08-23 DIAGNOSIS — D62 Acute posthemorrhagic anemia: Secondary | ICD-10-CM | POA: Diagnosis not present

## 2015-08-23 DIAGNOSIS — Z6841 Body Mass Index (BMI) 40.0 and over, adult: Secondary | ICD-10-CM

## 2015-08-23 DIAGNOSIS — M25562 Pain in left knee: Secondary | ICD-10-CM | POA: Diagnosis present

## 2015-08-23 DIAGNOSIS — Z01812 Encounter for preprocedural laboratory examination: Secondary | ICD-10-CM

## 2015-08-23 DIAGNOSIS — K219 Gastro-esophageal reflux disease without esophagitis: Secondary | ICD-10-CM | POA: Diagnosis present

## 2015-08-23 DIAGNOSIS — Z96651 Presence of right artificial knee joint: Secondary | ICD-10-CM | POA: Diagnosis present

## 2015-08-23 DIAGNOSIS — I739 Peripheral vascular disease, unspecified: Secondary | ICD-10-CM | POA: Diagnosis present

## 2015-08-23 DIAGNOSIS — Z96652 Presence of left artificial knee joint: Secondary | ICD-10-CM

## 2015-08-23 DIAGNOSIS — M21162 Varus deformity, not elsewhere classified, left knee: Secondary | ICD-10-CM | POA: Diagnosis present

## 2015-08-23 DIAGNOSIS — I10 Essential (primary) hypertension: Secondary | ICD-10-CM | POA: Diagnosis present

## 2015-08-23 DIAGNOSIS — M1711 Unilateral primary osteoarthritis, right knee: Secondary | ICD-10-CM

## 2015-08-23 HISTORY — PX: TOTAL KNEE ARTHROPLASTY: SHX125

## 2015-08-23 LAB — GLUCOSE, CAPILLARY: Glucose-Capillary: 103 mg/dL — ABNORMAL HIGH (ref 65–99)

## 2015-08-23 SURGERY — ARTHROPLASTY, KNEE, TOTAL
Anesthesia: Spinal | Site: Knee | Laterality: Left

## 2015-08-23 MED ORDER — 0.9 % SODIUM CHLORIDE (POUR BTL) OPTIME
TOPICAL | Status: DC | PRN
  Administered 2015-08-23: 1000 mL

## 2015-08-23 MED ORDER — LACTATED RINGERS IV SOLN: INTRAVENOUS | Status: DC

## 2015-08-23 MED ORDER — VALSARTAN-HYDROCHLOROTHIAZIDE 160-25 MG PO TABS: 1.0000 | ORAL_TABLET | Freq: Every morning | ORAL | Status: DC

## 2015-08-23 MED ORDER — ALLOPURINOL 300 MG PO TABS
300.0000 mg | ORAL_TABLET | Freq: Every day | ORAL | Status: DC
Start: 2015-08-24 — End: 2015-08-26
  Administered 2015-08-24 – 2015-08-26 (×3): 300 mg via ORAL
  Filled 2015-08-23 (×3): qty 1

## 2015-08-23 MED ORDER — METHOCARBAMOL 500 MG PO TABS
500.0000 mg | ORAL_TABLET | Freq: Four times a day (QID) | ORAL | Status: DC | PRN
  Administered 2015-08-24: 500 mg via ORAL
  Filled 2015-08-23: qty 1

## 2015-08-23 MED ORDER — ALUM & MAG HYDROXIDE-SIMETH 200-200-20 MG/5ML PO SUSP: 30.0000 mL | ORAL | Status: DC | PRN

## 2015-08-23 MED ORDER — PROPOFOL 10 MG/ML IV BOLUS
INTRAVENOUS | Status: AC
  Filled 2015-08-23: qty 20

## 2015-08-23 MED ORDER — CLINDAMYCIN PHOSPHATE 900 MG/50ML IV SOLN
INTRAVENOUS | Status: AC
  Filled 2015-08-23: qty 50

## 2015-08-23 MED ORDER — METFORMIN HCL 500 MG PO TABS
500.0000 mg | ORAL_TABLET | Freq: Every day | ORAL | Status: DC
  Administered 2015-08-24 – 2015-08-26 (×3): 500 mg via ORAL
  Filled 2015-08-23 (×5): qty 1

## 2015-08-23 MED ORDER — FENTANYL CITRATE (PF) 100 MCG/2ML IJ SOLN
INTRAMUSCULAR | Status: AC
  Filled 2015-08-23: qty 2

## 2015-08-23 MED ORDER — PROPOFOL 10 MG/ML IV BOLUS
INTRAVENOUS | Status: DC | PRN
  Administered 2015-08-23 (×2): 20 mg via INTRAVENOUS

## 2015-08-23 MED ORDER — DOCUSATE SODIUM 100 MG PO CAPS
100.0000 mg | ORAL_CAPSULE | Freq: Two times a day (BID) | ORAL | Status: DC
  Administered 2015-08-24 – 2015-08-26 (×5): 100 mg via ORAL

## 2015-08-23 MED ORDER — CLINDAMYCIN PHOSPHATE 600 MG/50ML IV SOLN
600.0000 mg | Freq: Four times a day (QID) | INTRAVENOUS | Status: AC
  Administered 2015-08-23 – 2015-08-24 (×2): 600 mg via INTRAVENOUS
  Filled 2015-08-23 (×2): qty 50

## 2015-08-23 MED ORDER — METOCLOPRAMIDE HCL 10 MG PO TABS: 5.0000 mg | ORAL_TABLET | Freq: Three times a day (TID) | ORAL | Status: DC | PRN

## 2015-08-23 MED ORDER — OXYCODONE HCL 5 MG PO TABS
5.0000 mg | ORAL_TABLET | ORAL | Status: DC | PRN
  Administered 2015-08-23 – 2015-08-25 (×5): 15 mg via ORAL
  Administered 2015-08-25 – 2015-08-26 (×3): 10 mg via ORAL
  Filled 2015-08-23 (×3): qty 3
  Filled 2015-08-23: qty 2
  Filled 2015-08-23: qty 3
  Filled 2015-08-23: qty 2
  Filled 2015-08-23: qty 3
  Filled 2015-08-23: qty 2

## 2015-08-23 MED ORDER — SITAGLIP PHOS-METFORMIN HCL ER 100-1000 MG PO TB24: 1.0000 | ORAL_TABLET | Freq: Every day | ORAL | Status: DC

## 2015-08-23 MED ORDER — DEXAMETHASONE SODIUM PHOSPHATE 10 MG/ML IJ SOLN
INTRAMUSCULAR | Status: AC
  Filled 2015-08-23: qty 1

## 2015-08-23 MED ORDER — BUPIVACAINE IN DEXTROSE 0.75-8.25 % IT SOLN
INTRATHECAL | Status: DC | PRN
  Administered 2015-08-23: 1.8 mL via INTRATHECAL

## 2015-08-23 MED ORDER — FENTANYL CITRATE (PF) 250 MCG/5ML IJ SOLN
INTRAMUSCULAR | Status: AC
  Filled 2015-08-23: qty 5

## 2015-08-23 MED ORDER — ACETAMINOPHEN 650 MG RE SUPP: 650.0000 mg | Freq: Four times a day (QID) | RECTAL | Status: DC | PRN

## 2015-08-23 MED ORDER — METHOCARBAMOL 1000 MG/10ML IJ SOLN
500.0000 mg | Freq: Four times a day (QID) | INTRAVENOUS | Status: DC | PRN
  Administered 2015-08-23: 500 mg via INTRAVENOUS
  Filled 2015-08-23 (×2): qty 5

## 2015-08-23 MED ORDER — DIPHENHYDRAMINE HCL 12.5 MG/5ML PO ELIX: 12.5000 mg | ORAL_SOLUTION | ORAL | Status: DC | PRN

## 2015-08-23 MED ORDER — MENTHOL 3 MG MT LOZG: 1.0000 | LOZENGE | OROMUCOSAL | Status: DC | PRN

## 2015-08-23 MED ORDER — FENTANYL CITRATE (PF) 100 MCG/2ML IJ SOLN
25.0000 ug | INTRAMUSCULAR | Status: DC | PRN
  Administered 2015-08-23: 0.5 ug via INTRAVENOUS
  Administered 2015-08-23: 50 ug via INTRAVENOUS

## 2015-08-23 MED ORDER — HYDROCHLOROTHIAZIDE 25 MG PO TABS
25.0000 mg | ORAL_TABLET | Freq: Every day | ORAL | Status: DC
  Administered 2015-08-24 – 2015-08-26 (×2): 25 mg via ORAL
  Filled 2015-08-23 (×3): qty 1

## 2015-08-23 MED ORDER — PHENYLEPHRINE HCL 10 MG/ML IJ SOLN
10.0000 mg | INTRAVENOUS | Status: DC | PRN
  Administered 2015-08-23: 10 ug/min via INTRAVENOUS

## 2015-08-23 MED ORDER — ONDANSETRON HCL 4 MG/2ML IJ SOLN
INTRAMUSCULAR | Status: AC
  Filled 2015-08-23: qty 2

## 2015-08-23 MED ORDER — SODIUM CHLORIDE 0.9 % IR SOLN
Status: DC | PRN
Start: 2015-08-23 — End: 2015-08-23
  Administered 2015-08-23 (×2): 1000 mL

## 2015-08-23 MED ORDER — PHENYLEPHRINE HCL 10 MG/ML IJ SOLN
INTRAMUSCULAR | Status: DC | PRN
  Administered 2015-08-23: 40 ug via INTRAVENOUS

## 2015-08-23 MED ORDER — TRANEXAMIC ACID 1000 MG/10ML IV SOLN
1000.0000 mg | INTRAVENOUS | Status: AC
  Administered 2015-08-23: 1000 mg via INTRAVENOUS
  Filled 2015-08-23: qty 10

## 2015-08-23 MED ORDER — ONDANSETRON HCL 4 MG/2ML IJ SOLN
INTRAMUSCULAR | Status: DC | PRN
  Administered 2015-08-23: 4 mg via INTRAVENOUS

## 2015-08-23 MED ORDER — LINAGLIPTIN 5 MG PO TABS
5.0000 mg | ORAL_TABLET | Freq: Every day | ORAL | Status: DC
  Administered 2015-08-24 – 2015-08-26 (×3): 5 mg via ORAL
  Filled 2015-08-23 (×5): qty 1

## 2015-08-23 MED ORDER — FENTANYL CITRATE (PF) 100 MCG/2ML IJ SOLN
INTRAMUSCULAR | Status: DC | PRN
  Administered 2015-08-23: 50 ug via INTRAVENOUS

## 2015-08-23 MED ORDER — ONDANSETRON HCL 4 MG/2ML IJ SOLN
4.0000 mg | Freq: Four times a day (QID) | INTRAMUSCULAR | Status: DC | PRN
  Administered 2015-08-23: 4 mg via INTRAVENOUS
  Filled 2015-08-23: qty 2

## 2015-08-23 MED ORDER — ACETAMINOPHEN 325 MG PO TABS: 650.0000 mg | ORAL_TABLET | Freq: Four times a day (QID) | ORAL | Status: DC | PRN

## 2015-08-23 MED ORDER — MIDAZOLAM HCL 5 MG/5ML IJ SOLN
INTRAMUSCULAR | Status: DC | PRN
  Administered 2015-08-23 (×2): 1 mg via INTRAVENOUS

## 2015-08-23 MED ORDER — LACTATED RINGERS IV SOLN
INTRAVENOUS | Status: DC
  Administered 2015-08-23: 1000 mL via INTRAVENOUS
  Administered 2015-08-23: 16:00:00 via INTRAVENOUS

## 2015-08-23 MED ORDER — MIDAZOLAM HCL 2 MG/2ML IJ SOLN
INTRAMUSCULAR | Status: AC
  Filled 2015-08-23: qty 2

## 2015-08-23 MED ORDER — IRBESARTAN 150 MG PO TABS
150.0000 mg | ORAL_TABLET | Freq: Every day | ORAL | Status: DC
  Administered 2015-08-24 – 2015-08-26 (×3): 150 mg via ORAL
  Filled 2015-08-23 (×3): qty 1

## 2015-08-23 MED ORDER — ROSUVASTATIN CALCIUM 20 MG PO TABS
20.0000 mg | ORAL_TABLET | Freq: Every day | ORAL | Status: DC
  Administered 2015-08-24 – 2015-08-26 (×3): 20 mg via ORAL
  Filled 2015-08-23 (×3): qty 1

## 2015-08-23 MED ORDER — MEPERIDINE HCL 50 MG/ML IJ SOLN: 6.2500 mg | INTRAMUSCULAR | Status: DC | PRN

## 2015-08-23 MED ORDER — PANTOPRAZOLE SODIUM 40 MG PO TBEC
40.0000 mg | DELAYED_RELEASE_TABLET | Freq: Every day | ORAL | Status: DC
  Administered 2015-08-24 – 2015-08-26 (×3): 40 mg via ORAL
  Filled 2015-08-23 (×3): qty 1

## 2015-08-23 MED ORDER — LIDOCAINE HCL (CARDIAC) 20 MG/ML IV SOLN
INTRAVENOUS | Status: AC
  Filled 2015-08-23: qty 5

## 2015-08-23 MED ORDER — PHENOL 1.4 % MT LIQD: 1.0000 | OROMUCOSAL | Status: DC | PRN

## 2015-08-23 MED ORDER — METOCLOPRAMIDE HCL 5 MG/ML IJ SOLN: 5.0000 mg | Freq: Three times a day (TID) | INTRAMUSCULAR | Status: DC | PRN

## 2015-08-23 MED ORDER — HYDROMORPHONE HCL 1 MG/ML IJ SOLN
1.0000 mg | INTRAMUSCULAR | Status: DC | PRN
  Administered 2015-08-23 (×2): 1 mg via INTRAVENOUS
  Filled 2015-08-23 (×2): qty 1

## 2015-08-23 MED ORDER — ONDANSETRON HCL 4 MG PO TABS: 4.0000 mg | ORAL_TABLET | Freq: Four times a day (QID) | ORAL | Status: DC | PRN

## 2015-08-23 MED ORDER — PROMETHAZINE HCL 25 MG/ML IJ SOLN: 6.2500 mg | INTRAMUSCULAR | Status: DC | PRN

## 2015-08-23 MED ORDER — CLINDAMYCIN PHOSPHATE 900 MG/50ML IV SOLN
900.0000 mg | INTRAVENOUS | Status: AC
  Administered 2015-08-23: 900 mg via INTRAVENOUS

## 2015-08-23 MED ORDER — PROPOFOL 500 MG/50ML IV EMUL
INTRAVENOUS | Status: DC | PRN
  Administered 2015-08-23: 100 ug/kg/min via INTRAVENOUS

## 2015-08-23 MED ORDER — RIVAROXABAN 10 MG PO TABS
10.0000 mg | ORAL_TABLET | Freq: Every day | ORAL | Status: DC
  Administered 2015-08-24 – 2015-08-26 (×3): 10 mg via ORAL
  Filled 2015-08-23 (×5): qty 1

## 2015-08-23 SURGICAL SUPPLY — 49 items
BAG ZIPLOCK 12X15 (MISCELLANEOUS) IMPLANT
BANDAGE ELASTIC 6 VELCRO ST LF (GAUZE/BANDAGES/DRESSINGS) ×2 IMPLANT
BENZOIN TINCTURE PRP APPL 2/3 (GAUZE/BANDAGES/DRESSINGS) IMPLANT
BLADE SAG 13.0X1.37X90 (BLADE) IMPLANT
BLADE SAG 18X100X1.27 (BLADE) IMPLANT
BOWL SMART MIX CTS (DISPOSABLE) ×2 IMPLANT
CAPT KNEE TOTAL 3 ×2 IMPLANT
CEMENT BONE 1-PACK (Cement) ×4 IMPLANT
CLOTH BEACON ORANGE TIMEOUT ST (SAFETY) ×2 IMPLANT
CUFF TOURN SGL QUICK 34 (TOURNIQUET CUFF) ×1
CUFF TRNQT CYL 34X4X40X1 (TOURNIQUET CUFF) ×1 IMPLANT
DRAPE U-SHAPE 47X51 STRL (DRAPES) ×2 IMPLANT
DRSG AQUACEL AG ADV 3.5X10 (GAUZE/BANDAGES/DRESSINGS) ×2 IMPLANT
DRSG PAD ABDOMINAL 8X10 ST (GAUZE/BANDAGES/DRESSINGS) ×2 IMPLANT
DURAPREP 26ML APPLICATOR (WOUND CARE) ×2 IMPLANT
ELECT REM PT RETURN 9FT ADLT (ELECTROSURGICAL) ×2
ELECTRODE REM PT RTRN 9FT ADLT (ELECTROSURGICAL) ×1 IMPLANT
GAUZE SPONGE 4X4 12PLY STRL (GAUZE/BANDAGES/DRESSINGS) ×2 IMPLANT
GAUZE XEROFORM 1X8 LF (GAUZE/BANDAGES/DRESSINGS) IMPLANT
GLOVE BIO SURGEON STRL SZ7.5 (GLOVE) ×2 IMPLANT
GLOVE BIOGEL PI IND STRL 8 (GLOVE) ×2 IMPLANT
GLOVE BIOGEL PI INDICATOR 8 (GLOVE) ×2
GLOVE ECLIPSE 8.0 STRL XLNG CF (GLOVE) ×2 IMPLANT
GOWN STRL REUS W/TWL XL LVL3 (GOWN DISPOSABLE) ×4 IMPLANT
HANDPIECE INTERPULSE COAX TIP (DISPOSABLE) ×1
IMMOBILIZER KNEE 20 (SOFTGOODS) ×2
IMMOBILIZER KNEE 20 THIGH 36 (SOFTGOODS) ×1 IMPLANT
INSERT KNEE CONSTRAINED 18MM (Insert) IMPLANT
NS IRRIG 1000ML POUR BTL (IV SOLUTION) ×2 IMPLANT
PACK TOTAL KNEE CUSTOM (KITS) ×2 IMPLANT
PADDING CAST COTTON 6X4 STRL (CAST SUPPLIES) ×2 IMPLANT
POSITIONER SURGICAL ARM (MISCELLANEOUS) ×2 IMPLANT
SET HNDPC FAN SPRY TIP SCT (DISPOSABLE) ×1 IMPLANT
SET PAD KNEE POSITIONER (MISCELLANEOUS) ×2 IMPLANT
STAPLER VISISTAT 35W (STAPLE) IMPLANT
STRIP CLOSURE SKIN 1/2X4 (GAUZE/BANDAGES/DRESSINGS) IMPLANT
SUCTION FRAZIER 12FR DISP (SUCTIONS) ×2 IMPLANT
SUT MNCRL AB 4-0 PS2 18 (SUTURE) IMPLANT
SUT VIC AB 0 CT1 27 (SUTURE) ×1
SUT VIC AB 0 CT1 27XBRD ANTBC (SUTURE) ×1 IMPLANT
SUT VIC AB 1 CT1 27 (SUTURE) ×2
SUT VIC AB 1 CT1 27XBRD ANTBC (SUTURE) ×2 IMPLANT
SUT VIC AB 2-0 CT1 27 (SUTURE) ×2
SUT VIC AB 2-0 CT1 TAPERPNT 27 (SUTURE) ×2 IMPLANT
TRAY FOLEY W/METER SILVER 14FR (SET/KITS/TRAYS/PACK) ×2 IMPLANT
TRAY FOLEY W/METER SILVER 16FR (SET/KITS/TRAYS/PACK) ×2 IMPLANT
WATER STERILE IRR 1500ML POUR (IV SOLUTION) ×2 IMPLANT
WRAP KNEE MAXI GEL POST OP (GAUZE/BANDAGES/DRESSINGS) ×2 IMPLANT
YANKAUER SUCT BULB TIP 10FT TU (MISCELLANEOUS) ×2 IMPLANT

## 2015-08-23 NOTE — H&P (Signed)
TOTAL KNEE ADMISSION H&P  Patient is being admitted for left total knee arthroplasty.  Subjective:  Chief Complaint:left knee pain.  HPI: Rachel Black, 64 y.o. female, has a history of pain and functional disability in the left knee due to trauma and has failed non-surgical conservative treatments for greater than 12 weeks to includeNSAID's and/or analgesics, corticosteriod injections, flexibility and strengthening excercises, supervised PT with diminished ADL's post treatment, use of assistive devices, weight reduction as appropriate and activity modification.  Onset of symptoms was gradual, starting 3 years ago with gradually worsening course since that time. The patient noted no past surgery on the left knee(s).  Patient currently rates pain in the left knee(s) at 10 out of 10 with activity. Patient has night pain, worsening of pain with activity and weight bearing, pain that interferes with activities of daily living, pain with passive range of motion, crepitus and joint swelling.  Patient has evidence of subchondral sclerosis, periarticular osteophytes and joint space narrowing by imaging studies. There is no active infection.  Patient Active Problem List   Diagnosis Date Noted  . Osteoarthritis of right knee 08/23/2015  . Arthritis of knee, right 04/20/2014  . Morbid obesity ([place]) 04/20/2014  . Status post total right knee replacement 04/20/2014  . Flushing reaction 03/10/2012  . DM 02/17/2007  . HYPERLIPIDEMIA 02/17/2007  . GOUT 02/17/2007  . Severe obesity (BMI >= 40) ([place]) 02/17/2007  . HYPERTENSION 02/17/2007  . RHEUMATIC FEVER, HX OF 02/17/2007   Past Medical History  Diagnosis Date  . Hyperlipidemia   . Hypertension   . Gout     NO RECENT FLARE UPS  . GERD (gastroesophageal reflux disease)   . Shortness of breath     WITH EXERTION  . Diabetes mellitus     ORAL MEDICATION  . Arthritis     PAIN AND OA BOTH KNEES AND SHOULDERS AND ELBOWS AND WRIST  . H/O: rheumatic  fever     AS A CHILD - NO KNOWN HEART MURMUR OR HEART PROBLEMS    Past Surgical History  Procedure Laterality Date  . Ventral hernia repair      x2  . Partial hysterectomy    . Cesarean section    . Esophagogastroduodenoscopy (egd) with propofol N/A 01/30/2013    Procedure: ESOPHAGOGASTRODUODENOSCOPY (EGD) WITH PROPOFOL;  Surgeon: Irene Shipper, MD;  Location: [facility];  Service: Endoscopy;  Laterality: N/A;  . Colonoscopy with propofol N/A 01/30/2013    Procedure: COLONOSCOPY WITH PROPOFOL;  Surgeon: Irene Shipper, MD;  Location: [facility];  Service: Endoscopy;  Laterality: N/A;  . Abdominal hysterectomy    . Total knee arthroplasty Right 04/20/2014    Procedure: RIGHT TOTAL KNEE ARTHROPLASTY CONVERTED TO RIGHT KNEE REIMPLANTATION;  Surgeon: Mcarthur Rossetti, MD;  Location: [facility];  Service: Orthopedics;  Laterality: Right;    No prescriptions prior to admission   Allergies  Allergen Reactions  . Aspirin     Regular ASA-stomach upset  . Other     SOME BANDAIDS CAUSE SKIN IRRITATION  . Penicillins Itching    Has patient had a PCN reaction causing immediate rash, facial/tongue/throat swelling, SOB or lightheadedness with hypotension: No Has patient had a PCN reaction causing severe rash involving mucus membranes or skin necrosis: No Has patient had a PCN reaction that required hospitalization No Has patient had a PCN reaction occurring within the last 10 years: No If all of the above answers are "NO", then may proceed with Cephalosporin use.  Social History  Substance Use Topics  . Smoking status: Never Smoker   . Smokeless tobacco: Never Used  . Alcohol Use: No    Family History  Problem Relation Age of Onset  . Diabetes Mother   . Hypertension Mother     entire family  . Stroke Father     CVA  . Hyperlipidemia Father     entire family  . Crohn's disease Son   . Irritable bowel syndrome Son   . Diabetes Father   . Heart disease Father      Review of  Systems  Musculoskeletal: Positive for joint pain.  All other systems reviewed and are negative.   Objective:  Physical Exam  Constitutional: She appears well-developed and well-nourished.  HENT:  Head: Normocephalic and atraumatic.  Eyes: EOM are normal. Pupils are equal, round, and reactive to light.  Neck: Normal range of motion. Neck supple.  Cardiovascular: Normal rate.   Respiratory: Effort normal and breath sounds normal.  GI: Soft. Bowel sounds are normal.    Vital signs in last 24 hours:    Labs:   Estimated body mass index is 45.18 kg/(m^2) as calculated from the following:   Height as of 04/21/14: 5' 6"  (1.676 m).   Weight as of 11/20/14: 126.916 kg (279 lb 12.8 oz).   Imaging Review Plain radiographs demonstrate severe degenerative joint disease of the left knee(s). The overall alignment ismild varus. The bone quality appears to be good for age and reported activity level.  Assessment/Plan:  End stage arthritis, left knee   The patient history, physical examination, clinical judgment of the provider and imaging studies are consistent with end stage degenerative joint disease of the left knee(s) and total knee arthroplasty is deemed medically necessary. The treatment options including medical management, injection therapy arthroscopy and arthroplasty were discussed at length. The risks and benefits of total knee arthroplasty were presented and reviewed. The risks due to aseptic loosening, infection, stiffness, patella tracking problems, thromboembolic complications and other imponderables were discussed. The patient acknowledged the explanation, agreed to proceed with the plan and consent was signed. Patient is being admitted for inpatient treatment for surgery, pain control, PT, OT, prophylactic antibiotics, VTE prophylaxis, progressive ambulation and ADL's and discharge planning. The patient is planning to be discharged home with home health services

## 2015-08-23 NOTE — Anesthesia Procedure Notes (Signed)
Spinal Patient location during procedure: OR End time: 08/23/2015 2:53 PM Staffing Resident/CRNA: Enrigue Catena E Performed by: anesthesiologist  Preanesthetic Checklist Completed: patient identified, site marked, surgical consent, pre-op evaluation, timeout performed, IV checked, risks and benefits discussed and monitors and equipment checked Spinal Block Patient position: sitting Prep: Betadine Patient monitoring: heart rate, continuous pulse ox and blood pressure Location: L3-4 Injection technique: single-shot Needle Needle type: Spinocan and Sprotte  Needle gauge: 24 G Needle length: 9 cm Assessment Sensory level: T6 Additional Notes Expiration date of kit checked and confirmed. Patient tolerated procedure well, without complications.

## 2015-08-23 NOTE — Anesthesia Preprocedure Evaluation (Signed)
Anesthesia Evaluation  Patient identified by MRN, date of birth, ID band Patient awake  General Assessment Comment: Arthritis of knee, right  04/20/2014   .  Morbid obesity  04/20/2014   .  Flushing reaction  03/10/2012   .  DM  02/17/2007   .  HYPERLIPIDEMIA  02/17/2007   .  GOUT  02/17/2007   .  Severe obesity (BMI >= 40)  02/17/2007   .  HYPERTENSION  02/17/2007   .  RHEUMATIC FEVER, HX OF     Reviewed: Allergy & Precautions, H&P , NPO status , Patient's Chart, lab work & pertinent test results  Airway Mallampati: II  TM Distance: >3 FB Neck ROM: Full    Dental no notable dental hx.    Pulmonary shortness of breath,    Pulmonary exam normal breath sounds clear to auscultation       Cardiovascular hypertension, Pt. on medications + Peripheral Vascular Disease  Normal cardiovascular exam Rhythm:Regular Rate:Normal     Neuro/Psych negative neurological ROS  negative psych ROS   GI/Hepatic Neg liver ROS, GERD  Medicated,  Endo/Other  diabetes, Type 2, Oral Hypoglycemic AgentsMorbid obesity  Renal/GU negative Renal ROS  negative genitourinary   Musculoskeletal negative musculoskeletal ROS (+)   Abdominal (+) + obese,   Peds negative pediatric ROS (+)  Hematology negative hematology ROS (+)   Anesthesia Other Findings   Reproductive/Obstetrics negative OB ROS                             Anesthesia Physical  Anesthesia Plan  ASA: III  Anesthesia Plan: Spinal   Post-op Pain Management:    Induction:   Airway Management Planned: Simple Face Mask  Additional Equipment:   Intra-op Plan:   Post-operative Plan:   Informed Consent: I have reviewed the patients History and Physical, chart, labs and discussed the procedure including the risks, benefits and alternatives for the proposed anesthesia with the patient or authorized representative who has indicated his/her understanding  and acceptance.   Dental advisory given  Plan Discussed with: CRNA  Anesthesia Plan Comments:         Anesthesia Quick Evaluation

## 2015-08-23 NOTE — Anesthesia Postprocedure Evaluation (Signed)
Anesthesia Post Note  Patient: Rachel Black  Procedure(s) Performed: Procedure(s) (LRB): LEFT TOTAL KNEE ARTHROPLASTY (Left)  Patient location during evaluation: PACU Anesthesia Type: Spinal Level of consciousness: oriented and awake and alert Pain management: pain level controlled Vital Signs Assessment: post-procedure vital signs reviewed and stable Respiratory status: spontaneous breathing, respiratory function stable and patient connected to nasal cannula oxygen Cardiovascular status: blood pressure returned to baseline and stable Postop Assessment: no headache and no backache Anesthetic complications: no    Last Vitals:  Filed Vitals:   08/23/15 1715 08/23/15 1730  BP: 129/71 114/69  Pulse: 80 82  Temp:    Resp: 17 17    Last Pain:  Filed Vitals:   08/23/15 1741  PainSc: 6                  Montez Hageman

## 2015-08-23 NOTE — Brief Op Note (Signed)
08/23/2015  4:39 PM  PATIENT:  Rachel Black  64 y.o. female  PRE-OPERATIVE DIAGNOSIS:  Severe osteoarthritis left knee  POST-OPERATIVE DIAGNOSIS:  Severe osteoarthritis left knee  PROCEDURE:  Procedure(s): LEFT TOTAL KNEE ARTHROPLASTY (Left)  SURGEON:  Surgeon(s) and Role:    * Mcarthur Rossetti, MD - Primary  PHYSICIAN ASSISTANT: Benita Stabile, PA-C  ANESTHESIA:   spinal  EBL:  Total I/O In: 1000 [I.V.:1000] Out: 150 [Urine:100; Blood:50]  BLOOD ADMINISTERED:none  DRAINS: none   LOCAL MEDICATIONS USED:  NONE  SPECIMEN:  No Specimen  DISPOSITION OF SPECIMEN:  N/A  COUNTS:  YES  TOURNIQUET:   Total Tourniquet Time Documented: Thigh (Left) - 64 minutes Total: Thigh (Left) - 64 minutes   DICTATION: .Other Dictation: Dictation Number [id number]  PLAN OF CARE: Admit to inpatient   PATIENT DISPOSITION:  PACU - hemodynamically stable.   Delay start of Pharmacological VTE agent (>24hrs) due to surgical blood loss or risk of bleeding: no

## 2015-08-23 NOTE — Transfer of Care (Signed)
Immediate Anesthesia Transfer of Care Note  Patient: Rachel Black  Procedure(s) Performed: Procedure(s): LEFT TOTAL KNEE ARTHROPLASTY (Left)  Patient Location: PACU  Anesthesia Type:MAC and Spinal  Level of Consciousness: awake, alert  and oriented  Airway & Oxygen Therapy: Patient Spontanous Breathing and Patient connected to face mask oxygen  Post-op Assessment: Report given to RN and Post -op Vital signs reviewed and stable  Post vital signs: Reviewed and stable  Last Vitals:  Filed Vitals:   08/23/15 1241  BP: 142/78  Pulse: 91  Temp: 36.6 C  Resp: 16    Complications: No apparent anesthesia complications

## 2015-08-24 LAB — CBC
HCT: 32.7 % — ABNORMAL LOW (ref 36.0–46.0)
Hemoglobin: 10.6 g/dL — ABNORMAL LOW (ref 12.0–15.0)
MCH: 28.8 pg (ref 26.0–34.0)
MCHC: 32.4 g/dL (ref 30.0–36.0)
MCV: 88.9 fL (ref 78.0–100.0)
PLATELETS: 140 10*3/uL — AB (ref 150–400)
RBC: 3.68 MIL/uL — ABNORMAL LOW (ref 3.87–5.11)
RDW: 14.5 % (ref 11.5–15.5)
WBC: 9.2 10*3/uL (ref 4.0–10.5)

## 2015-08-24 LAB — BASIC METABOLIC PANEL
ANION GAP: 10 (ref 5–15)
BUN: 17 mg/dL (ref 6–20)
CALCIUM: 9.2 mg/dL (ref 8.9–10.3)
CO2: 25 mmol/L (ref 22–32)
CREATININE: 1.02 mg/dL — AB (ref 0.44–1.00)
Chloride: 101 mmol/L (ref 101–111)
GFR, EST NON AFRICAN AMERICAN: 57 mL/min — AB (ref >60)
Glucose, Bld: 136 mg/dL — ABNORMAL HIGH (ref 65–99)
Potassium: 4.1 mmol/L (ref 3.5–5.1)
SODIUM: 136 mmol/L (ref 135–145)

## 2015-08-24 NOTE — Evaluation (Signed)
Physical Therapy Evaluation Patient Details Name: Rachel Black MRN: [id number] DOB: 09-Dec-1950 Today's Date: 08/24/2015   History of Present Illness  L TKR  Clinical Impression  Pt s/p L TKR presents with decreased L LE strength/ROM, post op pain, obesity and premorbid deconditioning limiting functional mobility.  Pt would benefit from follow up rehab at SNF level to maximize IND and safety prior to return home with ltd assist.    Follow Up Recommendations SNF    Equipment Recommendations  None recommended by PT    Recommendations for Other Services OT consult     Precautions / Restrictions Precautions Precautions: Knee;Fall Required Braces or Orthoses: Knee Immobilizer - Left Knee Immobilizer - Left: Discontinue once straight leg raise with < 10 degree lag Restrictions Weight Bearing Restrictions: No LLE Weight Bearing: Weight bearing as tolerated      Mobility  Bed Mobility Overal bed mobility: Needs Assistance;+2 for physical assistance;+ 2 for safety/equipment Bed Mobility: Supine to Sit;Sit to Supine     Supine to sit: Mod assist;+2 for physical assistance;+2 for safety/equipment Sit to supine: Mod assist;+2 for physical assistance;+2 for safety/equipment   General bed mobility comments: cues for sequence and use of R LE to self assist  Transfers Overall transfer level: Needs assistance Equipment used: Rolling walker (2 wheeled) Transfers: Sit to/from Stand Sit to Stand: From elevated surface;Mod assist;+2 physical assistance;+2 safety/equipment         General transfer comment: cues for LE management and use of UEs to self assist  Ambulation/Gait Ambulation/Gait assistance: Mod assist;+2 physical assistance;+2 safety/equipment Ambulation Distance (Feet): 2 Feet Assistive device: Rolling walker (2 wheeled) Gait Pattern/deviations: Step-to pattern;Decreased step length - right;Decreased step length - left;Decreased stance time - left;Shuffle;Trunk  flexed Gait velocity: dec   General Gait Details: .  Pt tolerating min WB on L   Stairs            Wheelchair Mobility    Modified Rankin (Stroke Patients Only)       Balance                                             Pertinent Vitals/Pain Pain Assessment: 0-10 Pain Score: 9  Pain Location: L knee Pain Descriptors / Indicators: Aching;Sore Pain Intervention(s): Limited activity within patient's tolerance;Monitored during session;Premedicated before session;Ice applied    Home Living Family/patient expects to be discharged to:: Skilled nursing facility                 Additional Comments: Pt states will be home alone with spouse working full time    Prior Function Level of Independence: Independent with assistive device(s)               Hand Dominance        Extremity/Trunk Assessment   Upper Extremity Assessment: Overall WFL for tasks assessed           Lower Extremity Assessment: LLE deficits/detail;RLE deficits/detail RLE Deficits / Details: 4+/5 strength with AROM at knee to 70 LLE Deficits / Details: 2/5 quads     Communication   Communication: No difficulties  Cognition Arousal/Alertness: Awake/alert Behavior During Therapy: WFL for tasks assessed/performed Overall Cognitive Status: Within Functional Limits for tasks assessed                      General Comments  Exercises Total Joint Exercises Ankle Circles/Pumps: AAROM;Both;15 reps;Supine      Assessment/Plan    PT Assessment Patient needs continued PT services  PT Diagnosis Difficulty walking   PT Problem List Decreased strength;Decreased range of motion;Decreased activity tolerance;Decreased mobility;Decreased knowledge of use of DME;Obesity;Pain  PT Treatment Interventions DME instruction;Gait training;Functional mobility training;Therapeutic activities;Therapeutic exercise;Patient/family education   PT Goals (Current goals can be  found in the Care Plan section) Acute Rehab PT Goals Patient Stated Goal: Rehab and then home with ltd assist PT Goal Formulation: With patient Time For Goal Achievement: 08/29/15 Potential to Achieve Goals: Fair    Frequency 7X/week   Barriers to discharge        Co-evaluation               End of Session Equipment Utilized During Treatment: Gait belt;Left knee immobilizer Activity Tolerance: Patient limited by pain Patient left: in bed;with call bell/phone within reach Nurse Communication: Mobility status         Time: [id number] PT Time Calculation (min) (ACUTE ONLY): 30 min   Charges:   PT Evaluation $Initial PT Evaluation Tier I: 1 Procedure PT Treatments $Gait Training: 8-22 mins   PT G Codes:        Ayodele Sangalang 13-Sep-2015, 1:49 PM

## 2015-08-24 NOTE — Care Management Note (Signed)
Case Management Note  Patient Details  Name: Rachel Black MRN: [id number] Date of Birth: 30-Jun-1951  Subjective/Objective:     LEFT TOTAL KNEE ARTHROPLASTY                Action/Plan: NCM spoke to pt and states she has RW at home. Plan is dc to SNF-rehab. Will make [facility] aware of pt's scheduled dc to rehab. CSW following for SNF placement.    Expected Discharge Date:  08/25/2015             Expected Discharge Plan:  Skilled Nursing Facility  In-House Referral:  Clinical Social Work  Discharge planning Services  CM Consult  Post Acute Care Choice:  NA Choice offered to:  NA  DME Arranged:  N/A DME Agency:  NA  HH Arranged:  NA HH Agency:  NA  Status of Service:  Completed, signed off  Medicare Important Message Given:    Date Medicare IM Given:    Medicare IM give by:    Date Additional Medicare IM Given:    Additional Medicare Important Message give by:     If discussed at [place] of Stay Meetings, dates discussed:    Additional Comments:  Erenest Rasher, RN 08/24/2015, 3:33 PM

## 2015-08-24 NOTE — NC FL2 (Signed)
[place] LEVEL OF CARE SCREENING TOOL     IDENTIFICATION  Patient Name: Rachel Black Birthdate: 1950/10/25 Sex: female Admission Date (Current Location): 08/23/2015  [facility] and [place] Number:  ([place])   Facility and Address:  [facility],  [place], [place]      Provider Number: [id number]  Attending Physician Name and Address:  Mcarthur Rossetti, *  Relative Name and Phone Number:       Current Level of Care: Hospital Recommended Level of Care: [place] Prior Approval Number:    Date Approved/Denied:   PASRR Number: [id number]  Discharge Plan: SNF    Current Diagnoses: Patient Active Problem List   Diagnosis Date Noted  . Osteoarthritis of right knee 08/23/2015  . Status post total left knee replacement 08/23/2015  . Arthritis of knee, right 04/20/2014  . Morbid obesity ([place]) 04/20/2014  . Status post total right knee replacement 04/20/2014  . [place]ushing reaction 03/10/2012  . DM 02/17/2007  . HYPERLIPIDEMIA 02/17/2007  . GOUT 02/17/2007  . Severe obesity (BMI >= 40) ([place]) 02/17/2007  . HYPERTENSION 02/17/2007  . RHEUMATIC FEVER, HX OF 02/17/2007    Orientation RESPIRATION BLADDER Height & Weight    Self, Time, Situation, Place  Normal Continent 5\' 6"  (167.6 cm) 295 lbs.  BEHAVIORAL SYMPTOMS/MOOD NEUROLOGICAL BOWEL NUTRITION STATUS      Continent Diet (carb modified fluid consistency thin)  AMBULATORY STATUS COMMUNICATION OF NEEDS Skin   Extensive Assist Verbally Surgical wounds                       Personal Care Assistance Level of Assistance  Dressing, Bathing     Dressing Assistance: Maximum assistance     Functional Limitations Info             SPECIAL CARE FACTORS FREQUENCY                       Contractures      Additional Factors Info  Allergies   Allergies Info: Aspirin, Other, Penicillins           Current Medications (08/24/2015):  This  is the current hospital active medication list Current Facility-Administered Medications  Medication Dose Route Frequency Provider Last Rate Last Dose  . acetaminophen (TYLENOL) tablet 650 mg  650 mg Oral Q6H PRN Mcarthur Rossetti, MD       Or  . acetaminophen (TYLENOL) suppository 650 mg  650 mg Rectal Q6H PRN Mcarthur Rossetti, MD      . allopurinol (ZYLOPRIM) tablet 300 mg  300 mg Oral Daily Mcarthur Rossetti, MD   300 mg at 08/24/15 0941  . alum & mag hydroxide-simeth (MAALOX/MYLANTA) 200-200-20 MG/5ML suspension 30 mL  30 mL Oral Q4H PRN Mcarthur Rossetti, MD      . diphenhydrAMINE (BENADRYL) 12.5 MG/5ML elixir 12.5-25 mg  12.5-25 mg Oral Q4H PRN Mcarthur Rossetti, MD      . docusate sodium (COLACE) capsule 100 mg  100 mg Oral BID Mcarthur Rossetti, MD   100 mg at 08/24/15 0941  . irbesartan (AVAPRO) tablet 150 mg  150 mg Oral Daily Mcarthur Rossetti, MD   150 mg at 08/24/15 0945   And  . hydrochlorothiazide (HYDRODIURIL) tablet 25 mg  25 mg Oral Daily Mcarthur Rossetti, MD   25 mg at 08/24/15 0945  . HYDROmorphone (DILAUDID) injection 1 mg  1 mg Intravenous Q2H PRN  Mcarthur Rossetti, MD   1 mg at 08/23/15 2350  . linagliptin (TRADJENTA) tablet 5 mg  5 mg Oral QAC breakfast Mcarthur Rossetti, MD   5 mg at 08/24/15 [id number]   And  . metFORMIN (GLUCOPHAGE) tablet 500 mg  500 mg Oral Q breakfast Mcarthur Rossetti, MD   500 mg at 08/24/15 [id number]  . menthol-cetylpyridinium (CEPACOL) lozenge 3 mg  1 lozenge Oral PRN Mcarthur Rossetti, MD       Or  . phenol (CHLORASEPTIC) mouth spray 1 spray  1 spray Mouth/Throat PRN Mcarthur Rossetti, MD      . methocarbamol (ROBAXIN) tablet 500 mg  500 mg Oral Q6H PRN Mcarthur Rossetti, MD   500 mg at 08/24/15 [id number]   Or  . methocarbamol (ROBAXIN) 500 mg in dextrose 5 % 50 mL IVPB  500 mg Intravenous Q6H PRN Mcarthur Rossetti, MD   500 mg at 08/23/15 1732  . metoCLOPramide (REGLAN) tablet 5-10  mg  5-10 mg Oral Q8H PRN Mcarthur Rossetti, MD       Or  . metoCLOPramide (REGLAN) injection 5-10 mg  5-10 mg Intravenous Q8H PRN Mcarthur Rossetti, MD      . ondansetron [facility]) tablet 4 mg  4 mg Oral Q6H PRN Mcarthur Rossetti, MD       Or  . ondansetron [facility]) injection 4 mg  4 mg Intravenous Q6H PRN Mcarthur Rossetti, MD   4 mg at 08/23/15 2110  . oxyCODONE (Oxy IR/ROXICODONE) immediate release tablet 5-15 mg  5-15 mg Oral Q3H PRN Mcarthur Rossetti, MD   15 mg at 08/24/15 1341  . pantoprazole (PROTONIX) EC tablet 40 mg  40 mg Oral Daily Mcarthur Rossetti, MD   40 mg at 08/24/15 [id number]  . rivaroxaban (XARELTO) tablet 10 mg  10 mg Oral Q breakfast Mcarthur Rossetti, MD   10 mg at 08/24/15 [phone]  . rosuvastatin (CRESTOR) tablet 20 mg  20 mg Oral Daily Mcarthur Rossetti, MD   20 mg at 08/24/15 [id number]     Discharge Medications: Please see discharge summary for a list of discharge medications.  Relevant Imaging Results:  Relevant Lab Results:   Additional Information    Carlean Jews, LCSW

## 2015-08-24 NOTE — Clinical Social Work Note (Signed)
Pt wants [facility] for rehab.  CSW sent clinicals through the [facility].  Per pt she has already spoken with [facility] regarding her rehab at their facility.  CSW will sent pt information to [facility] for authorization.  Dede Query, LCSW [facility] Clinical Social Worker - Weekend Coverage cell #: [phone]

## 2015-08-24 NOTE — Progress Notes (Signed)
Subjective: 1 Day Post-Op Procedure(s) (LRB): LEFT TOTAL KNEE ARTHROPLASTY (Left) Patient reports pain as moderate.  Asymptomatic acute blood loss anemia.  Objective: Vital signs in last 24 hours: Temp:  [97.3 F (36.3 C)-99.6 F (37.6 C)] 99.6 F (37.6 C) (12/10 7989) Pulse Rate:  [76-108] 108 (12/10 0638) Resp:  [12-19] 16 (12/10 0638) BP: (107-148)/(65-71) 142/70 mmHg (12/10 0638) SpO2:  [96 %-100 %] 98 % (12/10 2119)  Intake/Output from previous day: 12/09 0701 - 12/10 0700 In: 1940 [P.O.:240; I.V.:1650; IV Piggyback:50] Out: 425 [Urine:375; Blood:50] Intake/Output this shift: Total I/O In: -  Out: 250 [Urine:250]   Recent Labs  08/24/15 0550  HGB 10.6*    Recent Labs  08/24/15 0550  WBC 9.2  RBC 3.68*  HCT 32.7*  PLT 140*    Recent Labs  08/24/15 0550  NA 136  K 4.1  CL 101  CO2 25  BUN 17  CREATININE 1.02*  GLUCOSE 136*  CALCIUM 9.2   No results for input(s): LABPT, INR in the last 72 hours.  Sensation intact distally Intact pulses distally Incision: no drainage No cellulitis present Compartment soft  ? Foot drop  Assessment/Plan: 1 Day Post-Op Procedure(s) (LRB): LEFT TOTAL KNEE ARTHROPLASTY (Left) Up with therapy Discharge to SNF    [facility] 08/24/2015, 1:59 PM

## 2015-08-24 NOTE — Discharge Instructions (Addendum)
Information on my medicine - XARELTO (Rivaroxaban)  This medication education was reviewed with me or my healthcare representative as part of my discharge preparation.  The pharmacist that spoke with me during my hospital stay was:  WOFFORD, DREW A, RPH  Why was Xarelto prescribed for you? Xarelto was prescribed for you to reduce the risk of blood clots forming after orthopedic surgery. The medical term for these abnormal blood clots is venous thromboembolism (VTE).  What do you need to know about xarelto ? Take your Xarelto ONCE DAILY at the same time every day. You may take it either with or without food.  If you have difficulty swallowing the tablet whole, you may crush it and mix in applesauce just prior to taking your dose.  Take Xarelto exactly as prescribed by your doctor and DO NOT stop taking Xarelto without talking to the doctor who prescribed the medication.  Stopping without other VTE prevention medication to take the place of Xarelto may increase your risk of developing a clot.  After discharge, you should have regular check-up appointments with your healthcare provider that is prescribing your Xarelto.    What do you do if you miss a dose? If you miss a dose, take it as soon as you remember on the same day then continue your regularly scheduled once daily regimen the next day. Do not take two doses of Xarelto on the same day.   Important Safety Information A possible side effect of Xarelto is bleeding. You should call your healthcare provider right away if you experience any of the following: ? Bleeding from an injury or your nose that does not stop. ? Unusual colored urine (red or dark brown) or unusual colored stools (red or black). ? Unusual bruising for unknown reasons. ? A serious fall or if you hit your head (even if there is no bleeding).  Some medicines may interact with Xarelto and might increase your risk of bleeding while on Xarelto. To help avoid  this, consult your healthcare provider or pharmacist prior to using any new prescription or non-prescription medications, including herbals, vitamins, non-steroidal anti-inflammatory drugs (NSAIDs) and supplements.  This website has more information on Xarelto: [email or web address].[email or web address]   INSTRUCTIONS AFTER JOINT REPLACEMENT   o Remove items at home which could result in a fall. This includes throw rugs or furniture in walking pathways o ICE to the affected joint every three hours while awake for 30 minutes at a time, for at least the first 3-5 days, and then as needed for pain and swelling.  Continue to use ice for pain and swelling. You may notice swelling that will progress down to the foot and ankle.  This is normal after surgery.  Elevate your leg when you are not up walking on it.   o Continue to use the breathing machine you got in the hospital (incentive spirometer) which will help keep your temperature down.  It is common for your temperature to cycle up and down following surgery, especially at night when you are not up moving around and exerting yourself.  The breathing machine keeps your lungs expanded and your temperature down.   DIET:  As you were doing prior to hospitalization, we recommend a well-balanced diet.  DRESSING / WOUND CARE / SHOWERING  Keep the surgical dressing until follow up.  The dressing is water proof, so you can shower without any extra covering.  IF THE DRESSING FALLS OFF or the wound gets wet inside, change the dressing with sterile gauze.  Please use good hand washing techniques before changing the dressing.  Do not use any lotions or creams on the incision until instructed by your surgeon.    ACTIVITY  o Increase activity slowly as tolerated, but follow the weight bearing instructions below.   o No driving for 6 weeks or until further direction given by your physician.  You cannot drive while taking narcotics.  o No lifting or carrying greater than 10 lbs. until  further directed by your surgeon. o Avoid periods of inactivity such as sitting longer than an hour when not asleep. This helps prevent blood clots.  o You may return to work once you are authorized by your doctor.     WEIGHT BEARING   Weight bearing as tolerated with assist device (walker, cane, etc) as directed, use it as long as suggested by your surgeon or therapist, typically at least 4-6 weeks.   EXERCISES  Results after joint replacement surgery are often greatly improved when you follow the exercise, range of motion and muscle strengthening exercises prescribed by your doctor. Safety measures are also important to protect the joint from further injury. Any time any of these exercises cause you to have increased pain or swelling, decrease what you are doing until you are comfortable again and then slowly increase them. If you have problems or questions, call your caregiver or physical therapist for advice.   Rehabilitation is important following a joint replacement. After just a few days of immobilization, the muscles of the leg can become weakened and shrink (atrophy).  These exercises are designed to build up the tone and strength of the thigh and leg muscles and to improve motion. Often times heat used for twenty to thirty minutes before working out will loosen up your tissues and help with improving the range of motion but do not use heat for the first two weeks following surgery (sometimes heat can increase post-operative swelling).   These exercises can be done on a training (exercise) mat, on the floor, on a table or on a bed. Use whatever works the best and is most comfortable for you.    Use music or television while you are exercising so that the exercises are a pleasant break in your day. This will make your life better with the exercises acting as a break in your routine that you can look forward to.   Perform all exercises about fifteen times, three times per day or as directed.   You should exercise both the operative leg and the other leg as well.  Exercises include:    Quad Sets - Tighten up the muscle on the front of the thigh (Quad) and hold for 5-10 seconds.    Straight Leg Raises - With your knee straight (if you were given a brace, keep it on), lift the leg to 60 degrees, hold for 3 seconds, and slowly lower the leg.  Perform this exercise against resistance later as your leg gets stronger.   Leg Slides: Lying on your back, slowly slide your foot toward your buttocks, bending your knee up off the floor (only go as far as is comfortable). Then slowly slide your foot back down until your leg is flat on the floor again.   Angel Wings: Lying on your back spread your legs to the side as far apart as you can without causing discomfort.   Hamstring Strength:  Lying on your back, push your heel against the floor with your leg straight by tightening up the  muscles of your buttocks.  Repeat, but this time bend your knee to a comfortable angle, and push your heel against the floor.  You may put a pillow under the heel to make it more comfortable if necessary.   A rehabilitation program following joint replacement surgery can speed recovery and prevent re-injury in the future due to weakened muscles. Contact your doctor or a physical therapist for more information on knee rehabilitation.    CONSTIPATION  Constipation is defined medically as fewer than three stools per week and severe constipation as less than one stool per week.  Even if you have a regular bowel pattern at home, your normal regimen is likely to be disrupted due to multiple reasons following surgery.  Combination of anesthesia, postoperative narcotics, change in appetite and fluid intake all can affect your bowels.   YOU MUST use at least one of the following options; they are listed in order of increasing strength to get the job done.  They are all available over the counter, and you may need to use some,  POSSIBLY even all of these options:    Drink plenty of fluids (prune juice may be helpful) and high fiber foods Colace 100 mg by mouth twice a day  Senokot for constipation as directed and as needed Dulcolax (bisacodyl), take with full glass of water  Miralax (polyethylene glycol) once or twice a day as needed.  If you have tried all these things and are unable to have a bowel movement in the first 3-4 days after surgery call either your surgeon or your primary doctor.    If you experience loose stools or diarrhea, hold the medications until you stool forms back up.  If your symptoms do not get better within 1 week or if they get worse, check with your doctor.  If you experience "the worst abdominal pain ever" or develop nausea or vomiting, please contact the office immediately for further recommendations for treatment.   ITCHING:  If you experience itching with your medications, try taking only a single pain pill, or even half a pain pill at a time.  You can also use Benadryl over the counter for itching or also to help with sleep.   TED HOSE STOCKINGS:  Use stockings on both legs until for at least 2 weeks or as directed by physician office. They may be removed at night for sleeping.  MEDICATIONS:  See your medication summary on the After Visit Summary that nursing will review with you.  You may have some home medications which will be placed on hold until you complete the course of blood thinner medication.  It is important for you to complete the blood thinner medication as prescribed.  PRECAUTIONS:  If you experience chest pain or shortness of breath - call 911 immediately for transfer to the hospital emergency department.   If you develop a fever greater that 101 F, purulent drainage from wound, increased redness or drainage from wound, foul odor from the wound/dressing, or calf pain - CONTACT YOUR SURGEON.                                                   FOLLOW-UP APPOINTMENTS:  If  you do not already have a post-op appointment, please call the office for an appointment to be seen by your surgeon.  Guidelines for how  soon to be seen are listed in your After Visit Summary, but are typically between 1-4 weeks after surgery.  OTHER INSTRUCTIONS:   Knee Replacement:  Do not place pillow under knee, focus on keeping the knee straight while resting. CPM instructions: 0-90 degrees, 2 hours in the morning, 2 hours in the afternoon, and 2 hours in the evening. Place foam block, curve side up under heel at all times except when in CPM or when walking.  DO NOT modify, tear, cut, or change the foam block in any way.  MAKE SURE YOU:   Understand these instructions.   Get help right away if you are not doing well or get worse.    Thank you for letting us be a part of your medical care team.  It is a privilege we respect greatly.  We hope these instructions will help you stay on track for a fast and full recovery!

## 2015-08-24 NOTE — Progress Notes (Signed)
Patient continues to receive PRN analgesic therapy for post-operative site pain, localizable to the left lower extremity. Patient was encouraged routine use of the Incentive Spirometer device. Patient was instructed to use the device every  hour while awake at 10 breaths per session and coached to optimal performance.

## 2015-08-24 NOTE — Op Note (Signed)
NAMESAMYIAH, HALVORSEN NO.:  [id number]  MEDICAL RECORD NO.:  [id number]  LOCATION:  2229                         FACILITY:  [facility]  PHYSICIAN:  Lind Guest. Ninfa Linden, M.D.DATE OF BIRTH:  18-Aug-1951  DATE OF PROCEDURE:  08/23/2015 DATE OF DISCHARGE:                              OPERATIVE REPORT   PREOPERATIVE DIAGNOSIS:  Primary osteoarthritis and degenerative joint disease, left knee.  POSTOPERATIVE DIAGNOSIS:  Primary osteoarthritis and degenerative joint disease, left knee.  PROCEDURE:  Left total knee arthroplasty.  IMPLANTS:  Smith and Nephew Oxinium femur size 4, (posterior cruciate substituting) size 3 tibial tray, size 29 patellar button, size 21 mm constrained polyethylene insert.  SURGEON:  Lind Guest. Ninfa Linden, MD  ASSISTANT:  Erskine Emery, PA-C  ANESTHESIA:  Spinal.  ANTIBIOTICS:  900 mg of IV clindamycin.  BLOOD LOSS:  Less than 200 mL.  COMPLICATIONS:  None.  INDICATIONS:  Ms. Bise is a morbidly obese, 64 year old pleasant individual who has been a patient of mine for some time.  She has undergone a right total knee arthroplasty successfully at this point with varus deformity of her knee, hyperextending over knee, her decreased mobility, her daily pain and poor quality of life, she wished to proceed with a total knee arthroplasty on the left side.  She understands our total knee on the right side was quite difficult, need a revision stem on the tibial side due to her hyperextension to be unable to build up.  We felt along this side, we would be able to hopefully and go with a standard insert and components.  The risks and benefits were explained to her in detail including the risk of acute blood loss anemia, nerve and vessel injury, fracture, infection, and DVT with the goals of decreased pain, improved mobility, and overall improved quality of life.  She knows risk of death will be heightened given her size  and weight.  PROCEDURE DESCRIPTION:  After informed consent was obtained, appropriate left knee was marked.  She was brought to the operating room, where spinal anesthesia was obtained.  She was then laid supine on the operating table.  A nonsterile tourniquet was placed around her upper left leg.  Her left leg was prepped and draped from the thigh down the ankle with DuraPrep and sterile drapes including sterile stockinette. Time-out was called and she was identified as correct patient, correct left knee.  We then used an Esmarch to wrap out the leg and tourniquet was inflated to 350 mm of pressure.  We then made a direct midline incision over the patella and carried this proximally and distally we dissected out the knee joint, carried out a medial parapatellar arthrotomy.  We found a varus deformity of the knee and complete denuding of the cartilage on the medial femoral condyle and medial tibial plateau.  There was also patellofemoral well and some lateral compartment arthritic changes as well.  With the knee in a flexed position, we then made our proximal tibial cut taking 9 mm off the high side and tried to make a small cut based on her hyperextension of her knee.  We corrected her varus and valgus and negative slope.  We made  this cut without difficulty.  We then went to the femur and used the intramedullary guide for the femur to make our distal femoral cut.  We also set this to take a minimal amount of bone.  We made this cut without difficulty using a 5 degree external rotator, left distal femoral cutting guide.  With the knee back down deepened with an 18 mm cutting block is still even hyperextended.  We then went back into the femur and brought a femoral sizing guide for a size 4 femur.  We then placed our 4:1 cutting block for size 4 femur, made our anterior and posterior cuts followed by our chamfer cuts.  We then made our femoral box cut.  We left the femoral insert trial  #4 and placed them onto the tibia and made our keel punch and drill for a size 3 tibia.  We then placed an 18 mm polyethylene insert and brought the knee up and around, we felt that 21, we gave her more stability.  Unfortunately, there is a lot of poly but this is what she needs, given her mechanics of her knee. We then made our patella button and drilled 3 holes for a 29 patellar button.  We then removed all trial components and irrigated the knee with normal saline solution using pulsatile lavage.  We mixed our cement and cemented the real Smith and Nephew size 3 tibial tray, followed by the size 4 femur.  We placed the 21 constrained polyethylene insert and cemented the patellar button.  Once the cement had hardened, and after cleaning all cement debris from the knee, we let the tourniquet down and hemostasis was obtained with electrocautery.  We then irrigated the knee again with normal saline solution and closed our arthrotomy with interrupted #1 Vicryl suture followed by 0 Vicryl in the deep tissue, 2- 0 Vicryl in subcutaneous tissue, interrupted staples on the skin. Xeroform and well-padded sterile dressings applied.  She was taken to recovery room in stable condition.  All final counts were correct. There were no complications noted.  Of note, Erskine Emery PA-C assisted in the entire case.  His assistance was crucial for facilitating all aspects of this case.     Lind Guest. Ninfa Linden, M.D.     CYB/MEDQ  D:  08/23/2015  T:  08/24/2015  Job:  [id number]

## 2015-08-25 MED ORDER — POLYETHYLENE GLYCOL 3350 17 G PO PACK
17.0000 g | PACK | Freq: Every day | ORAL | Status: DC | PRN
  Administered 2015-08-26: 17 g via ORAL
  Filled 2015-08-25: qty 1

## 2015-08-25 MED ORDER — SENNA 8.6 MG PO TABS
1.0000 | ORAL_TABLET | Freq: Every day | ORAL | Status: DC | PRN
  Administered 2015-08-25: 8.6 mg via ORAL
  Filled 2015-08-25: qty 1

## 2015-08-25 NOTE — Progress Notes (Signed)
Patient ID: Rachel Black, female   DOB: 08-02-1951, 64 y.o.   MRN: [id number] Postoperative day 2 right total knee arthroplasty. Patient is progressing slowly with therapy. Plan for discharge to rehabilitation possibly Monday.

## 2015-08-25 NOTE — Progress Notes (Signed)
Physical Therapy Treatment Patient Details Name: Rachel Black MRN: [id number] DOB: May 08, 1951 Today's Date: 08/25/2015    History of Present Illness L direct anterior TKR PMH: OA, R knee surg, morbid obesity, DM, gout, HTn, arthritis    PT Comments    Pt progressing slowly with mobility and continues to require follow up rehab at SNF level.  Also of note, pt with trace DF only on L and diminished from session Saturday am.  Follow Up Recommendations  SNF     Equipment Recommendations  None recommended by PT    Recommendations for Other Services OT consult     Precautions / Restrictions Precautions Precautions: Knee;Fall Required Braces or Orthoses: Knee Immobilizer - Left Knee Immobilizer - Left: Discontinue once straight leg raise with < 10 degree lag Restrictions Weight Bearing Restrictions: No LLE Weight Bearing: Weight bearing as tolerated    Mobility  Bed Mobility Overal bed mobility: Needs Assistance Bed Mobility: Supine to Sit;Sit to Supine     Supine to sit: Mod assist;HOB elevated Sit to supine: Mod assist   General bed mobility comments: Increaseed time, pt required cues for sequencing bed mobility. pt able to use bed rails and R LE to progress to EOB  Transfers Overall transfer level: Needs assistance Equipment used: Rolling walker (2 wheeled) Transfers: Sit to/from Stand Sit to Stand: From elevated surface;+2 physical assistance;Max assist         General transfer comment: cues for hand placement and L LE positioning  Ambulation/Gait Ambulation/Gait assistance: +2 safety/equipment;+2 physical assistance;Mod assist Ambulation Distance (Feet): 5 Feet (3' and 2') Assistive device: Rolling walker (2 wheeled) Gait Pattern/deviations: Step-to pattern;Decreased step length - right;Decreased step length - left;Shuffle;Trunk flexed;Decreased stance time - left Gait velocity: dec   General Gait Details: Short shufflinig step.  Ltd WB tolerated on L.   Cues for posture, sequence and position from RW.  Pt demonstrating no DF on L with attempts to advance L LE   Stairs            Wheelchair Mobility    Modified Rankin (Stroke Patients Only)       Balance                                    Cognition Arousal/Alertness: Awake/alert Behavior During Therapy: WFL for tasks assessed/performed Overall Cognitive Status: Within Functional Limits for tasks assessed                      Exercises Total Joint Exercises Ankle Circles/Pumps: AROM;AAROM;PROM;Left;Both;15 reps;Supine (AAROM on L with trace DF noted only) Quad Sets: AAROM;AROM;Both;10 reps;Supine Heel Slides: AAROM;Left;10 reps;Supine Straight Leg Raises: AAROM;Left;10 reps;Supine Goniometric ROM: AAROM at L knee -10 - 40    General Comments        Pertinent Vitals/Pain Pain Assessment: Faces Faces Pain Scale: Hurts even more Pain Location: L knee Pain Descriptors / Indicators: Aching;Sore Pain Intervention(s): Limited activity within patient's tolerance;Monitored during session;Premedicated before session;Ice applied;Patient requesting pain meds-RN notified    Home Living                      Prior Function            PT Goals (current goals can now be found in the care plan section) Acute Rehab PT Goals Patient Stated Goal: to go to rehab PT Goal Formulation: With patient Time For Goal Achievement: 08/29/15  Potential to Achieve Goals: Fair Progress towards PT goals: Progressing toward goals    Frequency  7X/week    PT Plan Current plan remains appropriate    Co-evaluation PT/OT/SLP Co-Evaluation/Treatment: Yes Reason for Co-Treatment: For patient/therapist safety PT goals addressed during session: Mobility/safety with mobility OT goals addressed during session: ADL's and self-care     End of Session Equipment Utilized During Treatment: Gait belt;Left knee immobilizer Activity Tolerance: Patient limited by  fatigue;Patient limited by pain Patient left: in bed;with call bell/phone within reach     Time: 0805-0853 PT Time Calculation (min) (ACUTE ONLY): 48 min  Charges:  $Gait Training: 8-22 mins $Therapeutic Exercise: 8-22 mins                    G Codes:      BRADSHAW,HUNTER 23-Sep-2015, 12:49 PM

## 2015-08-25 NOTE — Progress Notes (Signed)
Pt called to go to the BR needed much coaxing and encouragement to be able to sit on side of bed,unable to stand at bedside with walker for attemp to stand pivot to bsc even with 2 max assist.,will enc active rom exercises and turn q 2hrs w/a.Pt states she was w/c bound pta x when used walker to go short distance to the bathroom.Aggie Moats D

## 2015-08-25 NOTE — Progress Notes (Signed)
Pt noted to have low u/o; Dr Sharol Given notified by phone & instructed that po fluids be encouraged. Chrysta Fulcher, [employer]

## 2015-08-25 NOTE — Clinical Social Work Note (Signed)
Clinical Social Work Assessment  Patient Details  Name: Rachel Black MRN: [id number] Date of Birth: 09-13-1951  Date of referral:  08/24/15               Reason for consult:  Facility Placement                Permission sought to share information with:  Facility Sport and exercise psychologist, Family Supports Permission granted to share information::  Yes, Verbal Permission Granted  Name::        Agency::     Relationship::     Contact Information:     Housing/Transportation Living arrangements for the past 2 months:  Single Family Home Source of Information:  Patient Patient Interpreter Needed:  None Criminal Activity/Legal Involvement Pertinent to Current Situation/Hospitalization:    Significant Relationships:  Adult Children, Spouse Lives with:  Spouse, Adult Children Do you feel safe going back to the place where you live?    Need for family participation in patient care:  Yes (Comment)  Care giving concerns:  No caregiver   Social Worker assessment / plan:  CSW met with pt at bedside to discuss discharge plans.  CSW provided explanation of role and prompted pt to explore her needs and feelings regarding rehab.  CSW provided active listening and agreed to send pt information to [facility].  CSW explained insurance authorization the might take a few days and pt would be required to pay out of pocket  Employment status:  Unemployed Insurance information:  Other (Comment Required) (blue cross blue shield) PT Recommendations:  [place] / Referral to community resources:     Patient/Family's Response to care:  Pt stated that she lives with her husband and grown son but they both work during the day.  Pt stated that she wanted to go to [facility] for rehab and was there last year for rehab. Pt stated that she understood insurance authorization  Patient/Family's Understanding of and Emotional Response to Diagnosis, Current Treatment, and Prognosis:  Pt understands that  she needs rehab and has been to [facility] in past.  Pt appeared to be tired but stated that she understood insurance authorization process.  Emotional Assessment Appearance:  Appears stated age Attitude/Demeanor/Rapport:  Lethargic Affect (typically observed):  Quiet Orientation:  Oriented to Self, Oriented to Place, Oriented to  Time, Oriented to Situation Alcohol / Substance use:    Psych involvement (Current and /or in the community):     Discharge Needs  Concerns to be addressed:    Readmission within the last 30 days:    Current discharge risk:    Barriers to Discharge:  No Barriers Identified   Carlean Jews, LCSW 08/25/2015, 4:34 PM

## 2015-08-25 NOTE — Evaluation (Signed)
Occupational Therapy Evaluation Patient Details Name: Rachel Black MRN: [id number] DOB: [birth year] Today's Date: 08/25/2015    History of Present Illness L direct anterior TKR PMH: OA, R knee surg, morbid obesity, DM, gout, HTn, arthritis   Clinical Impression   Patient is s/p L TKA surgery resulting in functional limitations due to the deficits listed below (see OT problem list). PTA was using RW heavily and w/c for community mobility. Patient will benefit from skilled OT acutely to increase independence and safety with ADLS to allow discharge SNF. OT to defer all further needs to SNF level care.      Follow Up Recommendations  SNF    Equipment Recommendations  Other (comment) (defer SNF)    Recommendations for Other Services       Precautions / Restrictions Precautions Precautions: Knee;Fall Required Braces or Orthoses: Knee Immobilizer - Left Knee Immobilizer - Left: Discontinue once straight leg raise with < 10 degree lag Restrictions LLE Weight Bearing: Weight bearing as tolerated      Mobility Bed Mobility Overal bed mobility: Needs Assistance Bed Mobility: Supine to Sit     Supine to sit: Mod assist;HOB elevated Sit to supine: Mod assist   General bed mobility comments: pt required cues for sequencing bed mobility. pt able to use bed rails and R LE to progress to EOB  Transfers Overall transfer level: Needs assistance Equipment used: Rolling walker (2 wheeled) Transfers: Sit to/from Stand Sit to Stand: From elevated surface;+2 physical assistance;Max assist         General transfer comment: cues for hand placement and L LE positioning    Balance                                            ADL Overall ADL's : Needs assistance/impaired Eating/Feeding: Set up;Bed level   Grooming: Wash/dry face;Set up   Upper Body Bathing: Minimal assitance;Sitting   Lower Body Bathing: Maximal assistance;Sitting/lateral leans            Toilet Transfer: +2 for physical assistance;Maximal assistance;BSC;RW Toilet Transfer Details (indicate cue type and reason): Pt completed Sit<>Stand from bed with elevation and ambulated several steps forward for OT to place 3n1 behind the patient. pt completed sit<>Stand from 3n1 and bed moved behind the patient  [place] and Hygiene: Total assistance;Sit to/from stand Toileting - Clothing Manipulation Details (indicate cue type and reason): pt complete minimal peri care in a seated position and OT completed in standing       General ADL Comments: Pt required max cues and (A) for L LE with bed mobility. pt completed 3n1  transfer with cues for L LE placement. pt wanting to externally rotate foot with cues to position foot under patient for sit<>stand     Vision Vision Assessment?: No apparent visual deficits   Perception     Praxis      Pertinent Vitals/Pain Pain Assessment: Faces Faces Pain Scale: Hurts even more Pain Location: L knee Pain Descriptors / Indicators: Aching Pain Intervention(s): Monitored during session;Repositioned;Premedicated before session (with PT Rachel Black supine in bed at end of session)     Hand Dominance Right   Extremity/Trunk Assessment Upper Extremity Assessment Upper Extremity Assessment: Overall WFL for tasks assessed   Lower Extremity Assessment Lower Extremity Assessment: Defer to PT evaluation   Cervical / Trunk Assessment Cervical / Trunk Assessment: Normal   Communication  Communication Communication: No difficulties   Cognition Arousal/Alertness: Awake/alert Behavior During Therapy: WFL for tasks assessed/performed Overall Cognitive Status: Within Functional Limits for tasks assessed                     General Comments       Exercises Exercises: Other exercises Other Exercises Other Exercises: of note : L LE with decr ankle pump compared to the R LE.    Shoulder Instructions      Home Living  Family/patient expects to be discharged to:: Skilled nursing facility                                        Prior Functioning/Environment Level of Independence: Independent with assistive device(s)        Comments: reports husband and patient rented a w/c that she used in the kitchen and community. Pt used RW while inside the home. pt reports having to walk side ways with RW due to bathroom door frame width    OT Diagnosis: Generalized weakness;Acute pain   OT Problem List:     OT Treatment/Interventions:      OT Goals(Current goals can be found in the care plan section) Acute Rehab OT Goals Patient Stated Goal: to go to rehab  OT Frequency:     Barriers to D/C:            Co-evaluation PT/OT/SLP Co-Evaluation/Treatment: Yes Reason for Co-Treatment: Complexity of the patient's impairments (multi-system involvement);For patient/therapist safety   OT goals addressed during session: ADL's and self-care      End of Session Equipment Utilized During Treatment: Rolling walker;Left knee immobilizer Nurse Communication: Mobility status;Precautions  Activity Tolerance: Patient tolerated treatment well Patient left: in bed;with call bell/phone within reach;Other (comment) (PT Rachel Black present)   Time: 0800-0826 OT Time Calculation (min): 26 min Charges:  OT General Charges $OT Visit: 1 Procedure OT Evaluation $Initial OT Evaluation Tier I: 1 Procedure G-Codes:    Parke Poisson B 09-11-15, 9:07 AM  Jeri Modena   OTR/L PagerIP:[phone] Office: [phone] .

## 2015-08-26 ENCOUNTER — Encounter (HOSPITAL_COMMUNITY): Payer: Self-pay | Admitting: Orthopaedic Surgery

## 2015-08-26 MED ORDER — METHOCARBAMOL 500 MG PO TABS: 500.0000 mg | ORAL_TABLET | Freq: Four times a day (QID) | ORAL | Status: DC | PRN

## 2015-08-26 MED ORDER — OXYCODONE-ACETAMINOPHEN 5-325 MG PO TABS: 1.0000 | ORAL_TABLET | ORAL | Status: DC | PRN

## 2015-08-26 MED ORDER — RIVAROXABAN 10 MG PO TABS: 10.0000 mg | ORAL_TABLET | Freq: Every day | ORAL | Status: DC

## 2015-08-26 NOTE — Progress Notes (Signed)
[facility] is able to accept pt for ST Rehab. They are working with [employer] now requesting authorization. SNF will provide CSW with an update by noon today. SNF is aware pt has a dc order. CSW asked SNF to consider LOG. A decision is pending.  Werner Lean LCSW [phone]

## 2015-08-26 NOTE — Progress Notes (Signed)
CSW assisting with d/c planning. [facility] is reviewing clinicals and will confirm with CSW if they are able to offer SNF placement. BCBS authorization required. Pt is progressing slowly with PT ( 5ft 2+ assist ). CSW has asked pt if she is able to pay pvtly at [facility] while waiting for authorization. She will discuss this with her spouse. CSW will continue to assist with d/c planning to SNF.  Werner Lean LCSW [phone]

## 2015-08-26 NOTE — Discharge Summary (Signed)
Patient ID: Rachel Black MRN: [id number] DOB/AGE: 63-Aug-1952 63 y.o.  Admit date: 08/23/2015 Discharge date: 08/26/2015  Admission Diagnoses:  Principal Problem:   Osteoarthritis of right knee Active Problems:   Status post total left knee replacement   Discharge Diagnoses:  Same  Past Medical History  Diagnosis Date  . Hyperlipidemia   . Hypertension   . Gout     NO RECENT FLARE UPS  . GERD (gastroesophageal reflux disease)   . Shortness of breath     WITH EXERTION  . Diabetes mellitus     ORAL MEDICATION  . Arthritis     PAIN AND OA BOTH KNEES AND SHOULDERS AND ELBOWS AND WRIST  . H/O: rheumatic fever     AS A CHILD - NO KNOWN HEART MURMUR OR HEART PROBLEMS    Surgeries: Procedure(s): LEFT TOTAL KNEE ARTHROPLASTY on 08/23/2015   Consultants:    Discharged Condition: Improved  Hospital Course: Rachel Black is an 64 y.o. female who was admitted 08/23/2015 for operative treatment ofOsteoarthritis of right knee. Patient has severe unremitting pain that affects sleep, daily activities, and work/hobbies. After pre-op clearance the patient was taken to the operating room on 08/23/2015 and underwent  Procedure(s): LEFT TOTAL KNEE ARTHROPLASTY.    Patient was given perioperative antibiotics: Anti-infectives    Start     Dose/Rate Route Frequency Ordered Stop   08/23/15 2100  clindamycin (CLEOCIN) IVPB 600 mg     600 mg 100 mL/hr over 30 Minutes Intravenous Every 6 hours 08/23/15 1804 08/24/15 0236   08/23/15 1245  clindamycin (CLEOCIN) IVPB 900 mg     900 mg 100 mL/hr over 30 Minutes Intravenous On call to O.R. 08/23/15 1239 08/23/15 1457       Patient was given sequential compression devices, early ambulation, and chemoprophylaxis to prevent DVT.  Patient benefited maximally from hospital stay and there were no complications.    Recent vital signs: Patient Vitals for the past 24 hrs:  BP Temp Temp src Pulse Resp SpO2  08/26/15 0411 (!) 114/50 mmHg 99.3 F  (37.4 C) Oral (!) 108 18 96 %  08/25/15 2200 104/83 mmHg 99.1 F (37.3 C) Oral (!) 118 18 91 %  08/25/15 1411 134/60 mmHg 98 F (36.7 C) Oral (!) 118 18 98 %     Recent laboratory studies:  Recent Labs  08/24/15 0550  WBC 9.2  HGB 10.6*  HCT 32.7*  PLT 140*  NA 136  K 4.1  CL 101  CO2 25  BUN 17  CREATININE 1.02*  GLUCOSE 136*  CALCIUM 9.2     Discharge Medications:     Medication List    STOP taking these medications        aspirin EC 81 MG tablet     meloxicam 7.5 MG tablet  Commonly known as:  MOBIC      TAKE these medications        allopurinol 300 MG tablet  Commonly known as:  ZYLOPRIM  TAKE 1 TABLET (300 MG TOTAL) BY MOUTH 2 (TWO) TIMES DAILY.     calcium carbonate 500 MG chewable tablet  Commonly known as:  TUMS - dosed in mg elemental calcium  Chew 2 tablets by mouth 3 (three) times daily as needed for indigestion or heartburn.     calcium-vitamin D 500-200 MG-UNIT tablet  Commonly known as:  OSCAL WITH D  Take 1 tablet by mouth 2 (two) times daily.     DEXILANT 60 MG capsule  Generic  drug:  dexlansoprazole  TAKE 1 CAPSULE (60 MG TOTAL) BY MOUTH EVERY MORNING     glucose blood test strip  Onetouch Ultra blue test strips Check Blood Sugar daily. Dx: E11.9     methocarbamol 500 MG tablet  Commonly known as:  ROBAXIN  Take 1 tablet (500 mg total) by mouth every 6 (six) hours as needed for muscle spasms.     ONETOUCH DELICA LANCETS FINE Misc  Check glucose qd     oxyCODONE-acetaminophen 5-325 MG tablet  Commonly known as:  ROXICET  Take 1-2 tablets by mouth every 4 (four) hours as needed.     rivaroxaban 10 MG Tabs tablet  Commonly known as:  XARELTO  Take 1 tablet (10 mg total) by mouth daily with breakfast.     rosuvastatin 20 MG tablet  Commonly known as:  CRESTOR  Take 1 tablet (20 mg total) by mouth daily. Office visit with repeat labs are due     SitaGLIPtin-MetFORMIN HCl [phone] MG Tb24  Commonly known as:  JANUMET XR  Take  1 tablet by mouth daily. REPEAT LABS ARE DUE NOW     trolamine salicylate 10 % cream  Commonly known as:  ASPERCREME  Apply 1 application topically 2 (two) times daily as needed for muscle pain.     valsartan-hydrochlorothiazide 160-25 MG tablet  Commonly known as:  DIOVAN-HCT  Take 1 tablet by mouth every morning.        Diagnostic Studies: Dg Knee Left Port  08/23/2015  CLINICAL DATA:  Post total left knee replacement EXAM: PORTABLE LEFT KNEE - 1-2 VIEW COMPARISON:  None. FINDINGS: Two views of the left knee submitted. There is left knee prosthesis with anatomic alignment. Postsurgical changes with midline anterior skin staples. Small amount of periarticular soft tissue air. IMPRESSION: Left knee prosthesis with anatomic alignment. Postsurgical changes are noted. Electronically Signed   By: Lahoma Crocker M.D.   On: 08/23/2015 17:48    Disposition: 03-Skilled Nursing Facility      Discharge Instructions    Discharge patient    Complete by:  As directed            Follow-up Information    Follow up with Mcarthur Rossetti, MD In 2 weeks.   Specialty:  Orthopedic Surgery   Contact information:   [place] [address] [phone]        Signed: Mcarthur Rossetti 08/26/2015, 7:21 AM

## 2015-08-26 NOTE — Progress Notes (Signed)
[facility] continues to try to connect with [employer] for authorization but has been unsuccessful. Pt has been requested to attempt to contact insurance. [facility] has not yet made a decision if they will accept pt with LOG. CSW has initiated state wide SNF search for facility that will accept LOG.  Roselyn Reef Hhaidinger LCSW [phone]

## 2015-08-26 NOTE — Progress Notes (Signed)
Attempted to call report to [employer]. Call not answered when being transferred to receiving nurse.

## 2015-08-26 NOTE — Clinical Social Work Placement (Signed)
   CLINICAL SOCIAL WORK PLACEMENT  NOTE  Date:  08/26/2015  Patient Details  Name: Rachel Black MRN: [id number] Date of Birth: [birth year]  Clinical Social Work is seeking post-discharge placement for this patient at the [place] level of care (*CSW will initial, date and re-position this form in  chart as items are completed):  Yes   Patient/family provided with [facility] Work Department's list of facilities offering this level of care within the geographic area requested by the patient (or if unable, by the patient's family).  Yes   Patient/family informed of their freedom to choose among providers that offer the needed level of care, that participate in Medicare, Medicaid or managed care program needed by the patient, have an available bed and are willing to accept the patient.  Yes   Patient/family informed of [facility]'s ownership interest in [facility] and [facility], as well as of the fact that they are under no obligation to receive care at these facilities.  PASRR submitted to EDS on       PASRR number received on       Existing PASRR number confirmed on 08/24/15     FL2 transmitted to all facilities in geographic area requested by pt/family on 08/24/15     FL2 transmitted to all facilities within larger geographic area on       Patient informed that his/her managed care company has contracts with or will negotiate with certain facilities, including the following:        Yes   Patient/family informed of bed offers received.  Patient chooses bed at [facility]     Physician recommends and patient chooses bed at      Patient to be transferred to [facility] on 08/26/15.  Patient to be transferred to facility by [facility]     Patient family notified on 08/26/15 of transfer.  Name of family member notified:  Spouse     PHYSICIAN       Additional Comment:     _______________________________________________ Luretha Rued, [place] 08/26/2015, 3:26 PM

## 2015-08-26 NOTE — Progress Notes (Signed)
Physical Therapy Treatment Patient Details Name: CICLEY MAIN MRN: [id number] DOB: 04/09/1951 Today's Date: 08/26/2015    History of Present Illness L direct anterior TKR PMH: OA, R knee surg, morbid obesity, DM, gout, HTn, arthritis    PT Comments    Limited treatment, delay in DC to rehab, just found out that she will go. Patient declined to work on mobility today. D  Follow Up Recommendations  SNF     Equipment Recommendations  None recommended by PT    Recommendations for Other Services       Precautions / Restrictions Precautions Precautions: Knee;Fall Required Braces or Orthoses: Knee Immobilizer - Left Knee Immobilizer - Left: Discontinue once straight leg raise with < 10 degree lag Restrictions LLE Weight Bearing: Weight bearing as tolerated    Mobility  Bed Mobility                  Transfers                    Ambulation/Gait             General Gait Details: NT-going to SNF today   Stairs            Wheelchair Mobility    Modified Rankin (Stroke Patients Only)       Balance                                    Cognition Arousal/Alertness: Awake/alert Behavior During Therapy: Flat affect                        Exercises Total Joint Exercises Ankle Circles/Pumps: AROM;Left;Both;Supine;10 reps Quad Sets: AAROM;AROM;Both;10 reps;Supine Heel Slides: AAROM;Left;10 reps;Supine Hip ABduction/ADduction: AAROM;Left;10 reps Straight Leg Raises: AAROM;Left;10 reps;Supine Goniometric ROM: AAROM 10-40 L knee    General Comments        Pertinent Vitals/Pain Pain Location: L knee Pain Descriptors / Indicators: Aching Pain Intervention(s): Patient requesting pain meds-RN notified;Limited activity within patient's tolerance    Home Living                      Prior Function            PT Goals (current goals can now be found in the care plan section) Progress towards PT goals:  Progressing toward goals (very slowly)    Frequency  7X/week    PT Plan Current plan remains appropriate    Co-evaluation             End of Session   Activity Tolerance: Patient limited by fatigue;Patient limited by pain Patient left: in bed;with call bell/phone within reach;with family/visitor present     Time: [id number] PT Time Calculation (min) (ACUTE ONLY): 22 min  Charges:  $Therapeutic Exercise: 8-22 mins                    G Codes:      Claretha Cooper 08/26/2015, 2:52 PM

## 2015-08-26 NOTE — Progress Notes (Signed)
Subjective: 3 Days Post-Op Procedure(s) (LRB): LEFT TOTAL KNEE ARTHROPLASTY (Left) Patient reports pain as moderate.    Objective: Vital signs in last 24 hours: Temp:  [98 F (36.7 C)-99.3 F (37.4 C)] 99.3 F (37.4 C) (12/12 0411) Pulse Rate:  [108-118] 108 (12/12 0411) Resp:  [18] 18 (12/12 0411) BP: (104-134)/(50-83) 114/50 mmHg (12/12 0411) SpO2:  [91 %-98 %] 96 % (12/12 0411)  Intake/Output from previous day: 12/11 0701 - 12/12 0700 In: 840 [P.O.:840] Out: 150 [Urine:150] Intake/Output this shift:     Recent Labs  08/24/15 0550  HGB 10.6*    Recent Labs  08/24/15 0550  WBC 9.2  RBC 3.68*  HCT 32.7*  PLT 140*    Recent Labs  08/24/15 0550  NA 136  K 4.1  CL 101  CO2 25  BUN 17  CREATININE 1.02*  GLUCOSE 136*  CALCIUM 9.2   No results for input(s): LABPT, INR in the last 72 hours.  Sensation intact distally Intact pulses distally Incision: dressing C/D/I No cellulitis present Compartment soft Still quite week foot dorsiflexion  Assessment/Plan: 3 Days Post-Op Procedure(s) (LRB): LEFT TOTAL KNEE ARTHROPLASTY (Left) Up with therapy Discharge to SNF today if bed available  Rachel Black Y 08/26/2015, 7:19 AM

## 2015-08-26 NOTE — Progress Notes (Addendum)
Pt will d/c to [facility] today. SNF will continue to try to get authorization from [facility]. Pt will pay out of pocket for placement and pt/spouse are aware that they may not be reimbursed for placement by insurance. [facility] transport is needed. Medical necessity form completed.Pt / spouse are aware out of pocket costs may be associated with [facility] transport. NSG has reviewed d/c summary, scripts, avs. Scripts included in d/c packet. D/C summary  sent to SNF for review prior to d/c.Spouse has # for Programmer, applications and will issue a complain against  [employer] for lack of assistance with d/c planning.  Werner Lean LCSW [phone]

## 2015-09-14 ENCOUNTER — Other Ambulatory Visit: Payer: Self-pay | Admitting: Family Medicine

## 2015-09-18 ENCOUNTER — Telehealth: Payer: Self-pay | Admitting: Family Medicine

## 2015-09-18 NOTE — Telephone Encounter (Signed)
Caller name: Beckie Busing - [facility]  Relationship to patient: Nurse   Can be reached: [phone]  Reason for call: Need verbal orders for home health plan of care .. 2 x a week for 4 weeks for strengthening and date balance training.    Please advise further.

## 2015-09-19 ENCOUNTER — Telehealth: Payer: Self-pay | Admitting: Family Medicine

## 2015-09-19 NOTE — Telephone Encounter (Signed)
Verbal given to [place].      KP

## 2015-09-19 NOTE — Telephone Encounter (Signed)
Relation to pt: self Call back number: [phone] Pharmacy: [id number] [place], [place] [phone](Phone) [phone](Fax)         Reason for call:  Patient requesting a refill rosuvastatin (CRESTOR) 20 MG tablet and states why was she prescribed valsartan-hydrochlorothiazide (DIOVAN-HCT) 160-25 MG tablet, please advise

## 2015-09-21 ENCOUNTER — Other Ambulatory Visit: Payer: Self-pay | Admitting: Family Medicine

## 2015-09-23 NOTE — Telephone Encounter (Signed)
Last seen 11/20/14 and uric acid has not been done since 03/2014.   Please advise     KP

## 2015-09-24 NOTE — Telephone Encounter (Signed)
I tried calling the patient and the line was busy.     KP

## 2015-09-26 NOTE — Telephone Encounter (Signed)
Not yet When will she be able to come in Why is she not taking the crestor?

## 2015-09-26 NOTE — Telephone Encounter (Signed)
Spoke with patient and she was taken off of the Diovan-Hct about 1 mo ago s/p knee surgery, she said today her BP averaged 136/77, she wanted to know whether or not she needed to continue the med's she is unable to come in and she said she is not taking the Crestor either.     KP

## 2015-09-27 NOTE — Telephone Encounter (Signed)
She needs the cholesterol meds ---but also needs labs I would want to check her bp here before starting bp meds back

## 2015-09-27 NOTE — Telephone Encounter (Signed)
Message left for [facility] with [facility] to return my call.    KP

## 2015-09-27 NOTE — Telephone Encounter (Signed)
Can I call HH to see if they could do vitals and labs.     KP

## 2015-09-27 NOTE — Telephone Encounter (Signed)
She just said she stopped taking all of the med's, she doesn't know when she would be able to come in.      [place]

## 2015-09-27 NOTE — Telephone Encounter (Signed)
yes

## 2015-09-30 ENCOUNTER — Telehealth: Payer: Self-pay | Admitting: Family Medicine

## 2015-09-30 NOTE — Telephone Encounter (Signed)
error 

## 2015-09-30 NOTE — Telephone Encounter (Signed)
Caller name: Clarise Cruz  Relationship to patient: [facility]  Can be reached: [phone]   Reason for call: Has question about one of the patient's medication

## 2015-09-30 NOTE — Telephone Encounter (Signed)
Caller name: Beckie Busing from [facility] Can be reached: [phone]  Reason for call: returning your call

## 2015-10-01 NOTE — Telephone Encounter (Signed)
He ordered it, but you have been signing her orders, this was an [place].,     KP

## 2015-10-01 NOTE — Telephone Encounter (Signed)
Spoke with Clarise Cruz and the Theraphist notified them that she discontinued there services because she did not feel like she needed the services anymore.    KP

## 2015-10-01 NOTE — Telephone Encounter (Signed)
I believe Dr Ninfa Linden ordered it

## 2015-10-14 ENCOUNTER — Telehealth: Payer: Self-pay | Admitting: *Deleted

## 2015-10-14 NOTE — Telephone Encounter (Signed)
Received Home Health Certification and Plan of Care from [place]; forwarded to provider/SLS 01/30

## 2015-10-16 ENCOUNTER — Telehealth: Payer: Self-pay | Admitting: *Deleted

## 2015-10-16 NOTE — Telephone Encounter (Signed)
Completed form mailed in [facility] envelope, copy to scan/SLS 02/01

## 2015-10-16 NOTE — Telephone Encounter (Signed)
Received physician orders from [place]; forwarded to provider/SLS 02/01

## 2015-10-18 NOTE — Telephone Encounter (Signed)
Completed form mailed in [facility] envelope, copy to scan/SLS 02/03

## 2015-11-29 ENCOUNTER — Other Ambulatory Visit: Payer: Self-pay | Admitting: Family Medicine

## 2015-12-02 NOTE — Telephone Encounter (Signed)
Please schedule this patient an apt /fasting and forward back to me. Diabetes follow up.     KP

## 2015-12-02 NOTE — Telephone Encounter (Signed)
Patient scheduled for 2:30 on Monday 3/27

## 2015-12-09 ENCOUNTER — Encounter: Payer: Self-pay | Admitting: Family Medicine

## 2015-12-09 ENCOUNTER — Ambulatory Visit (INDEPENDENT_AMBULATORY_CARE_PROVIDER_SITE_OTHER): Payer: BLUE CROSS/BLUE SHIELD | Admitting: Family Medicine

## 2015-12-09 VITALS — BP 118/70 | HR 73 | Temp 98.5°F | Ht 66.0 in | Wt 268.2 lb

## 2015-12-09 DIAGNOSIS — I10 Essential (primary) hypertension: Secondary | ICD-10-CM | POA: Diagnosis not present

## 2015-12-09 DIAGNOSIS — E1100 Type 2 diabetes mellitus with hyperosmolarity without nonketotic hyperglycemic-hyperosmolar coma (NKHHC): Secondary | ICD-10-CM | POA: Diagnosis not present

## 2015-12-09 DIAGNOSIS — E785 Hyperlipidemia, unspecified: Secondary | ICD-10-CM

## 2015-12-09 LAB — POCT URINALYSIS DIPSTICK
Bilirubin, UA: NEGATIVE
Blood, UA: NEGATIVE
Glucose, UA: NEGATIVE
KETONES UA: NEGATIVE
Leukocytes, UA: NEGATIVE
Nitrite, UA: NEGATIVE
PH UA: 5.5
PROTEIN UA: NEGATIVE
UROBILINOGEN UA: 0.2

## 2015-12-09 MED ORDER — VALSARTAN-HYDROCHLOROTHIAZIDE 80-12.5 MG PO TABS: 1.0000 | ORAL_TABLET | Freq: Every day | ORAL | Status: DC

## 2015-12-09 NOTE — Progress Notes (Signed)
Pre visit review using our clinic review tool, if applicable. No additional management support is needed unless otherwise documented below in the visit note. 

## 2015-12-09 NOTE — Patient Instructions (Signed)

## 2015-12-09 NOTE — Progress Notes (Signed)
Patient ID: Rachel Black, female    DOB: Aug 31, 1951  Age: 65 y.o. MRN: [id number]    Subjective:  Subjective HPI Rachel Black presents for f/u dm, htn, and cholesterol . HYPERTENSION  Blood pressure range-not checking   Chest pain- no      Dyspnea- no Lightheadedness- no   Edema- no Other side effects - no   Medication compliance: good Low salt diet- yes  DIABETES  Blood Sugar ranges-110-200  Polyuria- no New Visual problems- no Hypoglycemic symptoms- no Other side effects-no Medication compliance - good Last eye exam- due Foot exam- today  HYPERLIPIDEMIA  Medication compliance- good RUQ pain- no  Muscle aches- no Other side effects-no  Review of Systems  Constitutional: Negative for diaphoresis, appetite change, fatigue and unexpected weight change.  Eyes: Negative for pain, redness and visual disturbance.  Respiratory: Negative for cough, chest tightness, shortness of breath and wheezing.   Cardiovascular: Negative for chest pain, palpitations and leg swelling.  Endocrine: Negative for cold intolerance, heat intolerance, polydipsia, polyphagia and polyuria.  Genitourinary: Negative for dysuria, frequency and difficulty urinating.  Neurological: Negative for dizziness, light-headedness, numbness and headaches.    History Past Medical History  Diagnosis Date  . Hyperlipidemia   . Hypertension   . Gout     NO RECENT FLARE UPS  . GERD (gastroesophageal reflux disease)   . Shortness of breath     WITH EXERTION  . Diabetes mellitus     ORAL MEDICATION  . Arthritis     PAIN AND OA BOTH KNEES AND SHOULDERS AND ELBOWS AND WRIST  . H/O: rheumatic fever     AS A CHILD - NO KNOWN HEART MURMUR OR HEART PROBLEMS    She has past surgical history that includes Ventral hernia repair; Partial hysterectomy; Cesarean section; Esophagogastroduodenoscopy (egd) with propofol (N/A, 01/30/2013); Colonoscopy with propofol (N/A, 01/30/2013); Abdominal hysterectomy; Total knee  arthroplasty (Right, 04/20/2014); and Total knee arthroplasty (Left, 08/23/2015).   Her family history includes Crohn's disease in her son; Diabetes in her father and mother; Heart disease in her father; Hyperlipidemia in her father; Hypertension in her mother; Irritable bowel syndrome in her son; Stroke in her father.She reports that she has never smoked. She has never used smokeless tobacco. She reports that she does not drink alcohol or use illicit drugs.  Current Outpatient Prescriptions on File Prior to Visit  Medication Sig Dispense Refill  . allopurinol (ZYLOPRIM) 300 MG tablet TAKE 1 TABLET (300 MG TOTAL) BY MOUTH 2 (TWO) TIMES DAILY. 180 tablet 1  . calcium carbonate (TUMS - DOSED IN MG ELEMENTAL CALCIUM) 500 MG chewable tablet Chew 2 tablets by mouth 3 (three) times daily as needed for indigestion or heartburn.    . calcium-vitamin D (OSCAL WITH D) 500-200 MG-UNIT per tablet Take 1 tablet by mouth 2 (two) times daily.    Marland Kitchen DEXILANT 60 MG capsule TAKE 1 CAPSULE (60 MG TOTAL) BY MOUTH EVERY MORNING 90 capsule 1  . glucose blood test strip Onetouch Ultra blue test strips Check Blood Sugar daily. Dx: E11.9 100 each 12  . JANUMET XR 424 [id number] 1825 MG TB24 TAKE 1 TABLET BY MOUTH DAILY. REPEAT LABS ARE DUE NOW 90 tablet 0  . methocarbamol (ROBAXIN) 500 MG tablet Take 1 tablet (500 mg total) by mouth every 6 (six) hours as needed for muscle spasms. 60 tablet 0  . ONETOUCH DELICA LANCETS FINE MISC Check glucose qd 100 each 12  . rosuvastatin (CRESTOR) 20 MG tablet TAKE 1 TABLET BY MOUTH  DAILY. OFFICE VISIT WITH REPEAT LABS ARE DUE 90 tablet 0  . trolamine salicylate (ASPERCREME) 10 % cream Apply 1 application topically 2 (two) times daily as needed for muscle pain.     No current facility-administered medications on file prior to visit.     Objective:  Objective Physical Exam  Constitutional: She is oriented to person, place, and time. She appears well-developed and well-nourished.  HENT:  Head:  Normocephalic and atraumatic.  Eyes: Conjunctivae and EOM are normal.  Neck: Normal range of motion. Neck supple. No JVD present. Carotid bruit is not present. No thyromegaly present.  Cardiovascular: Normal rate, regular rhythm and normal heart sounds.   No murmur heard. Pulmonary/Chest: Effort normal and breath sounds normal. No respiratory distress. She has no wheezes. She has no rales. She exhibits no tenderness.  Musculoskeletal: She exhibits no edema.  Neurological: She is alert and oriented to person, place, and time.  Psychiatric: She has a normal mood and affect. Her behavior is normal.  Nursing note and vitals reviewed. Sensory exam of the foot is normal, tested with the monofilament. Good pulses, no lesions or ulcers, good peripheral pulses.  BP 118/70 mmHg  Pulse 73  Temp(Src) 98.5 F (36.9 C) (Oral)  Ht 5' 6"  (1.676 m)  Wt 268 lb 3.2 oz (121.655 kg)  BMI 43.31 kg/m2  SpO2 98% Wt Readings from Last 3 Encounters:  12/09/15 268 lb 3.2 oz (121.655 kg)  08/23/15 295 lb (133.811 kg)  08/13/15 295 lb (133.811 kg)     Lab Results  Component Value Date   WBC 9.2 08/24/2015   HGB 10.6* 08/24/2015   HCT 32.7* 08/24/2015   PLT 140* 08/24/2015   GLUCOSE 136* 08/24/2015   CHOL 119 11/20/2014   TRIG 92.0 11/20/2014   HDL 49.10 11/20/2014   LDLCALC 52 11/20/2014   ALT 11 11/20/2014   AST 17 11/20/2014   NA 136 08/24/2015   K 4.1 08/24/2015   CL 101 08/24/2015   CREATININE 1.02* 08/24/2015   BUN 17 08/24/2015   CO2 25 08/24/2015   TSH 2.26 05/26/2013   INR 0.99 08/13/2015   HGBA1C 6.2* 08/13/2015   MICROALBUR <0.7 11/20/2014    No results found.   Assessment & Plan:  Plan I have discontinued Ms. Dorce's valsartan-hydrochlorothiazide, rivaroxaban, and oxyCODONE-acetaminophen. I am also having her start on valsartan-hydrochlorothiazide. Additionally, I am having her maintain her calcium-vitamin D, calcium carbonate, trolamine salicylate, ONETOUCH DELICA LANCETS FINE,  glucose blood, DEXILANT, methocarbamol, rosuvastatin, allopurinol, and JANUMET XR.  Meds ordered this encounter  Medications  . valsartan-hydrochlorothiazide (DIOVAN HCT) 80-12.5 MG tablet    Sig: Take 1 tablet by mouth daily.    Dispense:  30 tablet    Refill:  2    Problem List Items Addressed This Visit    None    Visit Diagnoses    Essential hypertension    -  Primary    Relevant Medications    valsartan-hydrochlorothiazide (DIOVAN HCT) 80-12.5 MG tablet    Other Relevant Orders    POCT urinalysis dipstick (Completed)    Comprehensive metabolic panel    Uncontrolled type 2 diabetes mellitus with hyperosmolarity without coma, without long-term current use of insulin (HCC)        Relevant Medications    valsartan-hydrochlorothiazide (DIOVAN HCT) 80-12.5 MG tablet    Other Relevant Orders    Hemoglobin A1c    Comprehensive metabolic panel    Hyperlipidemia        Relevant Medications  valsartan-hydrochlorothiazide (DIOVAN HCT) 80-12.5 MG tablet    Other Relevant Orders    Lipid panel       Follow-up: Return in about 3 months (around 03/10/2016), or if symptoms worsen or fail to improve, for hypertension, hyperlipidemia, diabetes II.  Garnet Koyanagi, DO

## 2015-12-10 LAB — COMPREHENSIVE METABOLIC PANEL
ALT: 33 U/L (ref 0–35)
AST: 39 U/L — AB (ref 0–37)
Albumin: 4.4 g/dL (ref 3.5–5.2)
Alkaline Phosphatase: 49 U/L (ref 39–117)
BUN: 17 mg/dL (ref 6–23)
CHLORIDE: 102 meq/L (ref 96–112)
CO2: 27 meq/L (ref 19–32)
CREATININE: 1.02 mg/dL (ref 0.40–1.20)
Calcium: 10.4 mg/dL (ref 8.4–10.5)
GFR: 58.08 mL/min — ABNORMAL LOW (ref >60.00)
GLUCOSE: 122 mg/dL — AB (ref 70–99)
POTASSIUM: 4.1 meq/L (ref 3.5–5.1)
SODIUM: 139 meq/L (ref 135–145)
Total Bilirubin: 0.6 mg/dL (ref 0.2–1.2)
Total Protein: 6.7 g/dL (ref 6.0–8.3)

## 2015-12-10 LAB — LIPID PANEL
CHOL/HDL RATIO: 3
Cholesterol: 134 mg/dL (ref 0–200)
HDL: 41.8 mg/dL (ref >39.00)
NonHDL: 92.33
Triglycerides: 202 mg/dL — ABNORMAL HIGH (ref 0.0–149.0)
VLDL: 40.4 mg/dL — AB (ref 0.0–40.0)

## 2015-12-10 LAB — HEMOGLOBIN A1C: HEMOGLOBIN A1C: 6.1 % (ref 4.6–6.5)

## 2015-12-10 LAB — LDL CHOLESTEROL, DIRECT: Direct LDL: 60 mg/dL

## 2016-01-16 ENCOUNTER — Other Ambulatory Visit: Payer: Self-pay | Admitting: Family Medicine

## 2016-02-02 ENCOUNTER — Other Ambulatory Visit: Payer: Self-pay | Admitting: Family Medicine

## 2016-02-02 DIAGNOSIS — I10 Essential (primary) hypertension: Secondary | ICD-10-CM

## 2016-02-03 MED ORDER — VALSARTAN-HYDROCHLOROTHIAZIDE 80-12.5 MG PO TABS: 1.0000 | ORAL_TABLET | Freq: Every day | ORAL | Status: DC

## 2016-02-03 NOTE — Telephone Encounter (Signed)
Valsartan HCT refilled. Pt is due for f/u of BP on 03/10/16.  Please call pt to schedule follow up. Thanks!

## 2016-02-04 NOTE — Telephone Encounter (Signed)
Left message for patient to call office and schedule an OV with provider prior to any future medication refills

## 2016-02-13 ENCOUNTER — Ambulatory Visit (INDEPENDENT_AMBULATORY_CARE_PROVIDER_SITE_OTHER): Payer: BLUE CROSS/BLUE SHIELD | Admitting: Family Medicine

## 2016-02-13 ENCOUNTER — Encounter: Payer: Self-pay | Admitting: Family Medicine

## 2016-02-13 VITALS — BP 120/82 | HR 90 | Temp 98.3°F | Ht 66.0 in | Wt 271.8 lb

## 2016-02-13 DIAGNOSIS — E1151 Type 2 diabetes mellitus with diabetic peripheral angiopathy without gangrene: Secondary | ICD-10-CM

## 2016-02-13 DIAGNOSIS — I10 Essential (primary) hypertension: Secondary | ICD-10-CM

## 2016-02-13 DIAGNOSIS — E785 Hyperlipidemia, unspecified: Secondary | ICD-10-CM | POA: Diagnosis not present

## 2016-02-13 DIAGNOSIS — E1165 Type 2 diabetes mellitus with hyperglycemia: Secondary | ICD-10-CM | POA: Diagnosis not present

## 2016-02-13 DIAGNOSIS — M1000 Idiopathic gout, unspecified site: Secondary | ICD-10-CM

## 2016-02-13 DIAGNOSIS — Z1159 Encounter for screening for other viral diseases: Secondary | ICD-10-CM | POA: Diagnosis not present

## 2016-02-13 DIAGNOSIS — IMO0002 Reserved for concepts with insufficient information to code with codable children: Secondary | ICD-10-CM

## 2016-02-13 LAB — CBC WITH DIFFERENTIAL/PLATELET
Basophils Absolute: 0 10*3/uL (ref 0.0–0.1)
Basophils Relative: 0.1 % (ref 0.0–3.0)
EOS PCT: 0 % (ref 0.0–5.0)
Eosinophils Absolute: 0 10*3/uL (ref 0.0–0.7)
HEMATOCRIT: 34.7 % — AB (ref 36.0–46.0)
HEMOGLOBIN: 11.3 g/dL — AB (ref 12.0–15.0)
LYMPHS PCT: 26.5 % (ref 12.0–46.0)
Lymphs Abs: 1.6 10*3/uL (ref 0.7–4.0)
MCHC: 32.6 g/dL (ref 30.0–36.0)
MCV: 83.1 fl (ref 78.0–100.0)
MONOS PCT: 5.1 % (ref 3.0–12.0)
Monocytes Absolute: 0.3 10*3/uL (ref 0.1–1.0)
Neutro Abs: 4.1 10*3/uL (ref 1.4–7.7)
Neutrophils Relative %: 68.3 % (ref 43.0–77.0)
Platelets: 192 10*3/uL (ref 150.0–400.0)
RBC: 4.18 Mil/uL (ref 3.87–5.11)
RDW: 16.4 % — ABNORMAL HIGH (ref 11.5–15.5)
WBC: 6 10*3/uL (ref 4.0–10.5)

## 2016-02-13 LAB — COMPREHENSIVE METABOLIC PANEL
ALBUMIN: 4.5 g/dL (ref 3.5–5.2)
ALK PHOS: 49 U/L (ref 39–117)
ALT: 38 U/L — ABNORMAL HIGH (ref 0–35)
AST: 43 U/L — ABNORMAL HIGH (ref 0–37)
BUN: 19 mg/dL (ref 6–23)
CO2: 25 mEq/L (ref 19–32)
Calcium: 10.2 mg/dL (ref 8.4–10.5)
Chloride: 104 mEq/L (ref 96–112)
Creatinine, Ser: 1.1 mg/dL (ref 0.40–1.20)
GFR: 53.21 mL/min — AB (ref >60.00)
Glucose, Bld: 141 mg/dL — ABNORMAL HIGH (ref 70–99)
POTASSIUM: 3.9 meq/L (ref 3.5–5.1)
SODIUM: 139 meq/L (ref 135–145)
TOTAL PROTEIN: 6.8 g/dL (ref 6.0–8.3)
Total Bilirubin: 0.5 mg/dL (ref 0.2–1.2)

## 2016-02-13 LAB — LIPID PANEL
Cholesterol: 134 mg/dL (ref 0–200)
HDL: 36.4 mg/dL — ABNORMAL LOW (ref >39.00)
NonHDL: 97.88
Total CHOL/HDL Ratio: 4
Triglycerides: 234 mg/dL — ABNORMAL HIGH (ref 0.0–149.0)
VLDL: 46.8 mg/dL — ABNORMAL HIGH (ref 0.0–40.0)

## 2016-02-13 LAB — HEMOGLOBIN A1C: HEMOGLOBIN A1C: 5.7 % (ref 4.6–6.5)

## 2016-02-13 LAB — LDL CHOLESTEROL, DIRECT: LDL DIRECT: 60 mg/dL

## 2016-02-13 MED ORDER — ROSUVASTATIN CALCIUM 20 MG PO TABS: 20.0000 mg | ORAL_TABLET | Freq: Every day | ORAL | Status: DC

## 2016-02-13 NOTE — Patient Instructions (Signed)

## 2016-02-13 NOTE — Progress Notes (Signed)
Pre visit review using our clinic review tool, if applicable. No additional management support is needed unless otherwise documented below in the visit note. 

## 2016-02-13 NOTE — Progress Notes (Signed)
Patient ID: Rachel Black, female    DOB: 10-20-1950  Age: 65 y.o. MRN: [id number]    Subjective:  Subjective HPI Rachel Black presents for f/u dm, htn and cholesterol  HYPERTENSION  Blood pressure range-no checking   Chest pain- no      Dyspnea- no Lightheadedness- no   Edema- no change Other side effects - no   Medication compliance: good Low salt diet- yes  DIABETES  Blood Sugar ranges-good per pt  Polyuria- no New Visual problems- no Hypoglycemic symptoms- no Other side effects-no Medication compliance - good Last eye exam- due Foot exam- today  HYPERLIPIDEMIA  Medication compliance- good RUQ pain- no  Muscle aches- no Other side effects-no  Review of Systems  Constitutional: Negative for diaphoresis, appetite change, fatigue and unexpected weight change.  Eyes: Negative for pain, redness and visual disturbance.  Respiratory: Negative for cough, chest tightness, shortness of breath and wheezing.   Cardiovascular: Negative for chest pain, palpitations and leg swelling.  Endocrine: Negative for cold intolerance, heat intolerance, polydipsia, polyphagia and polyuria.  Genitourinary: Negative for dysuria, frequency and difficulty urinating.  Neurological: Negative for dizziness, light-headedness, numbness and headaches.    History Past Medical History  Diagnosis Date  . Hyperlipidemia   . Hypertension   . Gout     NO RECENT FLARE UPS  . GERD (gastroesophageal reflux disease)   . Shortness of breath     WITH EXERTION  . Diabetes mellitus     ORAL MEDICATION  . Arthritis     PAIN AND OA BOTH KNEES AND SHOULDERS AND ELBOWS AND WRIST  . H/O: rheumatic fever     AS A CHILD - NO KNOWN HEART MURMUR OR HEART PROBLEMS    She has past surgical history that includes Ventral hernia repair; Partial hysterectomy; Cesarean section; Esophagogastroduodenoscopy (egd) with propofol (N/A, 01/30/2013); Colonoscopy with propofol (N/A, 01/30/2013); Abdominal hysterectomy;  Total knee arthroplasty (Right, 04/20/2014); and Total knee arthroplasty (Left, 08/23/2015).   Her family history includes Crohn's disease in her son; Diabetes in her father and mother; Heart disease in her father; Hyperlipidemia in her father; Hypertension in her mother; Irritable bowel syndrome in her son; Stroke in her father.She reports that she has never smoked. She has never used smokeless tobacco. She reports that she does not drink alcohol or use illicit drugs.  Current Outpatient Prescriptions on File Prior to Visit  Medication Sig Dispense Refill  . calcium carbonate (TUMS - DOSED IN MG ELEMENTAL CALCIUM) 500 MG chewable tablet Chew 2 tablets by mouth 3 (three) times daily as needed for indigestion or heartburn.    . calcium-vitamin D (OSCAL WITH D) 500-200 MG-UNIT per tablet Take 1 tablet by mouth 2 (two) times daily.    Marland Kitchen DEXILANT 60 MG capsule TAKE 1 CAPSULE (60 MG TOTAL) BY MOUTH EVERY MORNING 90 capsule 3  . glucose blood test strip Onetouch Ultra blue test strips Check Blood Sugar daily. Dx: E11.9 100 each 12  . methocarbamol (ROBAXIN) 500 MG tablet Take 1 tablet (500 mg total) by mouth every 6 (six) hours as needed for muscle spasms. 60 tablet 0  . ONETOUCH DELICA LANCETS FINE MISC Check glucose qd 100 each 12  . trolamine salicylate (ASPERCREME) 10 % cream Apply 1 application topically 2 (two) times daily as needed for muscle pain.    . valsartan-hydrochlorothiazide (DIOVAN HCT) 80-12.5 MG tablet Take 1 tablet by mouth daily. 30 tablet 2   No current facility-administered medications on file prior to visit.  Objective:  Objective Physical Exam  Constitutional: She is oriented to person, place, and time. She appears well-developed and well-nourished.  HENT:  Head: Normocephalic and atraumatic.  Eyes: Conjunctivae and EOM are normal.  Neck: Normal range of motion. Neck supple. No JVD present. Carotid bruit is not present. No thyromegaly present.  Cardiovascular: Normal rate,  regular rhythm and normal heart sounds.   No murmur heard. Pulmonary/Chest: Effort normal and breath sounds normal. No respiratory distress. She has no wheezes. She has no rales. She exhibits no tenderness.  Musculoskeletal: She exhibits no edema.  Neurological: She is alert and oriented to person, place, and time.  Psychiatric: She has a normal mood and affect. Her behavior is normal.  Nursing note and vitals reviewed. Sensory exam of the foot is normal, tested with the monofilament. Good pulses, no lesions or ulcers, good peripheral pulses.  BP 120/82 mmHg  Pulse 90  Temp(Src) 98.3 F (36.8 C) (Oral)  Ht 5' 6"  (1.676 m)  Wt 271 lb 12.8 oz (123.288 kg)  BMI 43.89 kg/m2  SpO2 97% Wt Readings from Last 3 Encounters:  02/13/16 271 lb 12.8 oz (123.288 kg)  12/09/15 268 lb 3.2 oz (121.655 kg)  08/23/15 295 lb (133.811 kg)     Lab Results  Component Value Date   WBC 6.0 02/13/2016   HGB 11.3* 02/13/2016   HCT 34.7* 02/13/2016   PLT 192.0 02/13/2016   GLUCOSE 141* 02/13/2016   CHOL 134 02/13/2016   TRIG 234.0* 02/13/2016   HDL 36.40* 02/13/2016   LDLDIRECT 60.0 02/13/2016   LDLCALC 52 11/20/2014   ALT 38* 02/13/2016   AST 43* 02/13/2016   NA 139 02/13/2016   K 3.9 02/13/2016   CL 104 02/13/2016   CREATININE 1.10 02/13/2016   BUN 19 02/13/2016   CO2 25 02/13/2016   TSH 2.26 05/26/2013   INR 0.99 08/13/2015   HGBA1C 5.7 02/13/2016   MICROALBUR <0.7 11/20/2014    No results found.   Assessment & Plan:  Plan I have changed Rachel Black's JANUMET XR to SitaGLIPtin-MetFORMIN HCl. I am also having her maintain her calcium-vitamin D, calcium carbonate, trolamine salicylate, ONETOUCH DELICA LANCETS FINE, glucose blood, methocarbamol, DEXILANT, valsartan-hydrochlorothiazide, rosuvastatin, and allopurinol.  Meds ordered this encounter  Medications  . rosuvastatin (CRESTOR) 20 MG tablet    Sig: Take 1 tablet (20 mg total) by mouth daily.    Dispense:  90 tablet    Refill:  1    . SitaGLIPtin-MetFORMIN HCl (JANUMET XR) [id number] MG TB24    Sig: TAKE 1 TABLET BY MOUTH DAILY.    Dispense:  90 tablet    Refill:  1  . allopurinol (ZYLOPRIM) 300 MG tablet    Sig: TAKE 1 TABLET (300 MG TOTAL) BY MOUTH 2 (TWO) TIMES DAILY.    Dispense:  180 tablet    Refill:  1    Problem List Items Addressed This Visit    None    Visit Diagnoses    DM (diabetes mellitus) type II uncontrolled, periph vascular disorder ([place])    -  Primary    Relevant Medications    rosuvastatin (CRESTOR) 20 MG tablet    SitaGLIPtin-MetFORMIN HCl (JANUMET XR) [id number] MG TB24    Other Relevant Orders    Lipid panel (Completed)    Hemoglobin A1c (Completed)    CBC with Differential/Platelet (Completed)    Comprehensive metabolic panel (Completed)    Hyperlipidemia        Relevant Medications    rosuvastatin (CRESTOR) 20  MG tablet    Other Relevant Orders    Lipid panel (Completed)    Hemoglobin A1c (Completed)    CBC with Differential/Platelet (Completed)    Comprehensive metabolic panel (Completed)    Essential hypertension        Relevant Medications    rosuvastatin (CRESTOR) 20 MG tablet    Other Relevant Orders    Lipid panel (Completed)    Hemoglobin A1c (Completed)    CBC with Differential/Platelet (Completed)    Comprehensive metabolic panel (Completed)    Need for hepatitis C screening test        Relevant Orders    Hepatitis C antibody (Completed)       Follow-up: Return in about 3 months (around 05/15/2016), or if symptoms worsen or fail to improve, for hypertension, hyperlipidemia, diabetes II.  Ann Held, DO

## 2016-02-14 LAB — HEPATITIS C ANTIBODY: HCV AB: NEGATIVE

## 2016-02-14 MED ORDER — SITAGLIP PHOS-METFORMIN HCL ER 100-1000 MG PO TB24: ORAL_TABLET | ORAL | Status: DC

## 2016-02-14 MED ORDER — ALLOPURINOL 300 MG PO TABS: ORAL_TABLET | ORAL | Status: DC

## 2016-02-21 ENCOUNTER — Other Ambulatory Visit: Payer: Self-pay

## 2016-02-21 MED ORDER — FENOFIBRATE 160 MG PO TABS: 160.0000 mg | ORAL_TABLET | Freq: Every day | ORAL | Status: DC

## 2016-04-15 ENCOUNTER — Other Ambulatory Visit: Payer: Self-pay

## 2016-04-15 MED ORDER — FENOFIBRATE 160 MG PO TABS: 160.0000 mg | ORAL_TABLET | Freq: Every day | ORAL | 1 refills | Status: DC

## 2016-07-08 ENCOUNTER — Other Ambulatory Visit: Payer: Self-pay | Admitting: Family Medicine

## 2016-07-08 DIAGNOSIS — E119 Type 2 diabetes mellitus without complications: Secondary | ICD-10-CM

## 2016-08-31 ENCOUNTER — Other Ambulatory Visit: Payer: Self-pay | Admitting: Family Medicine

## 2016-08-31 DIAGNOSIS — E1151 Type 2 diabetes mellitus with diabetic peripheral angiopathy without gangrene: Secondary | ICD-10-CM

## 2016-08-31 DIAGNOSIS — IMO0002 Reserved for concepts with insufficient information to code with codable children: Secondary | ICD-10-CM

## 2016-08-31 DIAGNOSIS — E1165 Type 2 diabetes mellitus with hyperglycemia: Principal | ICD-10-CM

## 2016-09-14 DIAGNOSIS — Z5189 Encounter for other specified aftercare: Secondary | ICD-10-CM

## 2016-09-14 HISTORY — DX: Encounter for other specified aftercare: Z51.89

## 2016-10-08 ENCOUNTER — Telehealth (INDEPENDENT_AMBULATORY_CARE_PROVIDER_SITE_OTHER): Payer: Self-pay | Admitting: Orthopaedic Surgery

## 2016-10-08 NOTE — Telephone Encounter (Signed)
Patient called advised she went to the dentist yesterday and the dentist gave her 4 clindamycin. Patient said the dentist gave her a Rx to get filled. Patient want to know if she should keep taking the medicine when she go back. The number to contact patient is [phone]

## 2016-10-08 NOTE — Telephone Encounter (Signed)
Patient aware she doesn't need to take anymore antibiotics

## 2016-10-13 ENCOUNTER — Other Ambulatory Visit: Payer: Self-pay | Admitting: Family Medicine

## 2016-10-13 DIAGNOSIS — M1000 Idiopathic gout, unspecified site: Secondary | ICD-10-CM

## 2016-10-21 ENCOUNTER — Other Ambulatory Visit: Payer: Self-pay | Admitting: Family Medicine

## 2016-10-21 DIAGNOSIS — I10 Essential (primary) hypertension: Secondary | ICD-10-CM

## 2016-10-29 ENCOUNTER — Other Ambulatory Visit: Payer: Self-pay | Admitting: *Deleted

## 2016-10-29 DIAGNOSIS — I10 Essential (primary) hypertension: Secondary | ICD-10-CM

## 2016-10-29 MED ORDER — VALSARTAN-HYDROCHLOROTHIAZIDE 80-12.5 MG PO TABS: 1.0000 | ORAL_TABLET | Freq: Every day | ORAL | 0 refills | Status: DC

## 2016-12-07 ENCOUNTER — Encounter (HOSPITAL_COMMUNITY): Payer: Self-pay | Admitting: Oncology

## 2016-12-07 ENCOUNTER — Inpatient Hospital Stay (HOSPITAL_COMMUNITY)
Admission: EM | Admit: 2016-12-07 | Discharge: 2016-12-10 | DRG: 418 | Disposition: A | Discharge status: Home / Self Care | Payer: BLUE CROSS/BLUE SHIELD | Attending: Internal Medicine | Admitting: Internal Medicine

## 2016-12-07 ENCOUNTER — Emergency Department (HOSPITAL_COMMUNITY): Payer: BLUE CROSS/BLUE SHIELD

## 2016-12-07 DIAGNOSIS — Z90710 Acquired absence of both cervix and uterus: Secondary | ICD-10-CM

## 2016-12-07 DIAGNOSIS — Z8249 Family history of ischemic heart disease and other diseases of the circulatory system: Secondary | ICD-10-CM

## 2016-12-07 DIAGNOSIS — D696 Thrombocytopenia, unspecified: Secondary | ICD-10-CM | POA: Diagnosis present

## 2016-12-07 DIAGNOSIS — K219 Gastro-esophageal reflux disease without esophagitis: Secondary | ICD-10-CM | POA: Diagnosis present

## 2016-12-07 DIAGNOSIS — N182 Chronic kidney disease, stage 2 (mild): Secondary | ICD-10-CM | POA: Diagnosis present

## 2016-12-07 DIAGNOSIS — Z833 Family history of diabetes mellitus: Secondary | ICD-10-CM

## 2016-12-07 DIAGNOSIS — E1169 Type 2 diabetes mellitus with other specified complication: Secondary | ICD-10-CM

## 2016-12-07 DIAGNOSIS — N058 Unspecified nephritic syndrome with other morphologic changes: Secondary | ICD-10-CM | POA: Diagnosis not present

## 2016-12-07 DIAGNOSIS — R932 Abnormal findings on diagnostic imaging of liver and biliary tract: Secondary | ICD-10-CM | POA: Diagnosis not present

## 2016-12-07 DIAGNOSIS — I10 Essential (primary) hypertension: Secondary | ICD-10-CM | POA: Diagnosis present

## 2016-12-07 DIAGNOSIS — K317 Polyp of stomach and duodenum: Secondary | ICD-10-CM | POA: Diagnosis present

## 2016-12-07 DIAGNOSIS — N179 Acute kidney failure, unspecified: Secondary | ICD-10-CM | POA: Diagnosis present

## 2016-12-07 DIAGNOSIS — R1010 Upper abdominal pain, unspecified: Secondary | ICD-10-CM | POA: Diagnosis not present

## 2016-12-07 DIAGNOSIS — K851 Biliary acute pancreatitis without necrosis or infection: Secondary | ICD-10-CM | POA: Diagnosis present

## 2016-12-07 DIAGNOSIS — E785 Hyperlipidemia, unspecified: Secondary | ICD-10-CM | POA: Diagnosis present

## 2016-12-07 DIAGNOSIS — Z6841 Body Mass Index (BMI) 40.0 and over, adult: Secondary | ICD-10-CM

## 2016-12-07 DIAGNOSIS — M109 Gout, unspecified: Secondary | ICD-10-CM | POA: Diagnosis present

## 2016-12-07 DIAGNOSIS — K760 Fatty (change of) liver, not elsewhere classified: Secondary | ICD-10-CM | POA: Diagnosis present

## 2016-12-07 DIAGNOSIS — R1011 Right upper quadrant pain: Secondary | ICD-10-CM

## 2016-12-07 DIAGNOSIS — R740 Nonspecific elevation of levels of transaminase and lactic acid dehydrogenase [LDH]: Secondary | ICD-10-CM

## 2016-12-07 DIAGNOSIS — Z96653 Presence of artificial knee joint, bilateral: Secondary | ICD-10-CM | POA: Diagnosis present

## 2016-12-07 DIAGNOSIS — K838 Other specified diseases of biliary tract: Secondary | ICD-10-CM | POA: Diagnosis not present

## 2016-12-07 DIAGNOSIS — E876 Hypokalemia: Secondary | ICD-10-CM | POA: Diagnosis present

## 2016-12-07 DIAGNOSIS — K8051 Calculus of bile duct without cholangitis or cholecystitis with obstruction: Secondary | ICD-10-CM

## 2016-12-07 DIAGNOSIS — D62 Acute posthemorrhagic anemia: Secondary | ICD-10-CM | POA: Diagnosis not present

## 2016-12-07 DIAGNOSIS — R7989 Other specified abnormal findings of blood chemistry: Secondary | ICD-10-CM

## 2016-12-07 DIAGNOSIS — E1129 Type 2 diabetes mellitus with other diabetic kidney complication: Secondary | ICD-10-CM | POA: Diagnosis not present

## 2016-12-07 DIAGNOSIS — Z823 Family history of stroke: Secondary | ICD-10-CM

## 2016-12-07 DIAGNOSIS — K8020 Calculus of gallbladder without cholecystitis without obstruction: Secondary | ICD-10-CM

## 2016-12-07 DIAGNOSIS — K859 Acute pancreatitis without necrosis or infection, unspecified: Secondary | ICD-10-CM | POA: Diagnosis not present

## 2016-12-07 DIAGNOSIS — E1121 Type 2 diabetes mellitus with diabetic nephropathy: Secondary | ICD-10-CM | POA: Diagnosis not present

## 2016-12-07 DIAGNOSIS — Z794 Long term (current) use of insulin: Secondary | ICD-10-CM | POA: Diagnosis not present

## 2016-12-07 DIAGNOSIS — I129 Hypertensive chronic kidney disease with stage 1 through stage 4 chronic kidney disease, or unspecified chronic kidney disease: Secondary | ICD-10-CM | POA: Diagnosis present

## 2016-12-07 DIAGNOSIS — E119 Type 2 diabetes mellitus without complications: Secondary | ICD-10-CM | POA: Diagnosis not present

## 2016-12-07 DIAGNOSIS — R945 Abnormal results of liver function studies: Secondary | ICD-10-CM

## 2016-12-07 DIAGNOSIS — Z419 Encounter for procedure for purposes other than remedying health state, unspecified: Secondary | ICD-10-CM

## 2016-12-07 DIAGNOSIS — E1159 Type 2 diabetes mellitus with other circulatory complications: Secondary | ICD-10-CM | POA: Diagnosis present

## 2016-12-07 DIAGNOSIS — R748 Abnormal levels of other serum enzymes: Secondary | ICD-10-CM

## 2016-12-07 DIAGNOSIS — E1122 Type 2 diabetes mellitus with diabetic chronic kidney disease: Secondary | ICD-10-CM | POA: Diagnosis present

## 2016-12-07 DIAGNOSIS — I152 Hypertension secondary to endocrine disorders: Secondary | ICD-10-CM | POA: Diagnosis present

## 2016-12-07 HISTORY — DX: Calculus of bile duct without cholangitis or cholecystitis with obstruction: K80.51

## 2016-12-07 LAB — CBC
HCT: 31.7 % — ABNORMAL LOW (ref 36.0–46.0)
Hemoglobin: 10 g/dL — ABNORMAL LOW (ref 12.0–15.0)
MCH: 26.2 pg (ref 26.0–34.0)
MCHC: 31.5 g/dL (ref 30.0–36.0)
MCV: 83.2 fL (ref 78.0–100.0)
PLATELETS: 120 10*3/uL — AB (ref 150–400)
RBC: 3.81 MIL/uL — AB (ref 3.87–5.11)
RDW: 16.6 % — AB (ref 11.5–15.5)
WBC: 10 10*3/uL (ref 4.0–10.5)

## 2016-12-07 LAB — COMPREHENSIVE METABOLIC PANEL
ALK PHOS: 89 U/L (ref 38–126)
ALT: 161 U/L — AB (ref 14–54)
AST: 231 U/L — ABNORMAL HIGH (ref 15–41)
Albumin: 3.8 g/dL (ref 3.5–5.0)
Anion gap: 11 (ref 5–15)
BILIRUBIN TOTAL: 1.2 mg/dL (ref 0.3–1.2)
BUN: 22 mg/dL — ABNORMAL HIGH (ref 6–20)
CALCIUM: 9.5 mg/dL (ref 8.9–10.3)
CO2: 22 mmol/L (ref 22–32)
CREATININE: 1.51 mg/dL — AB (ref 0.44–1.00)
Chloride: 107 mmol/L (ref 101–111)
GFR calc Af Amer: 41 mL/min — ABNORMAL LOW (ref >60)
GFR, EST NON AFRICAN AMERICAN: 35 mL/min — AB (ref >60)
Glucose, Bld: 141 mg/dL — ABNORMAL HIGH (ref 65–99)
Potassium: 3.4 mmol/L — ABNORMAL LOW (ref 3.5–5.1)
Sodium: 140 mmol/L (ref 135–145)
Total Protein: 6.7 g/dL (ref 6.5–8.1)

## 2016-12-07 LAB — URINALYSIS, ROUTINE W REFLEX MICROSCOPIC
Glucose, UA: NEGATIVE mg/dL
HGB URINE DIPSTICK: NEGATIVE
Ketones, ur: NEGATIVE mg/dL
Nitrite: NEGATIVE
PH: 5 (ref 5.0–8.0)
Protein, ur: NEGATIVE mg/dL
SPECIFIC GRAVITY, URINE: 1.027 (ref 1.005–1.030)

## 2016-12-07 LAB — DIFFERENTIAL
LYMPHS ABS: 2.1 10*3/uL (ref 0.7–4.0)
Lymphocytes Relative: 21 %
MONO ABS: 0.5 10*3/uL (ref 0.1–1.0)
MONOS PCT: 5 %
NEUTROS ABS: 7.4 10*3/uL (ref 1.7–7.7)
Neutrophils Relative %: 74 %

## 2016-12-07 LAB — GLUCOSE, CAPILLARY
GLUCOSE-CAPILLARY: 186 mg/dL — AB (ref 65–99)
Glucose-Capillary: 176 mg/dL — ABNORMAL HIGH (ref 65–99)
Glucose-Capillary: 109 mg/dL — ABNORMAL HIGH (ref 65–99)
Glucose-Capillary: 105 mg/dL — ABNORMAL HIGH (ref 65–99)
Glucose-Capillary: 104 mg/dL — ABNORMAL HIGH (ref 65–99)

## 2016-12-07 LAB — LIPASE, BLOOD: Lipase: 277 U/L — ABNORMAL HIGH (ref 11–51)

## 2016-12-07 LAB — PROTIME-INR
INR: 1.04
Prothrombin Time: 13.6 seconds (ref 11.4–15.2)

## 2016-12-07 MED ORDER — IOPAMIDOL (ISOVUE-300) INJECTION 61%
100.0000 mL | Freq: Once | INTRAVENOUS | Status: AC | PRN
  Administered 2016-12-07: 100 mL via INTRAVENOUS

## 2016-12-07 MED ORDER — ALLOPURINOL 300 MG PO TABS
300.0000 mg | ORAL_TABLET | Freq: Two times a day (BID) | ORAL | Status: DC
  Administered 2016-12-07 – 2016-12-10 (×6): 300 mg via ORAL
  Filled 2016-12-07 (×6): qty 1

## 2016-12-07 MED ORDER — FENTANYL CITRATE (PF) 100 MCG/2ML IJ SOLN
100.0000 ug | Freq: Once | INTRAMUSCULAR | Status: AC
  Administered 2016-12-07: 100 ug via INTRAVENOUS
  Filled 2016-12-07: qty 2

## 2016-12-07 MED ORDER — INSULIN ASPART 100 UNIT/ML ~~LOC~~ SOLN
0.0000 [IU] | SUBCUTANEOUS | Status: DC
  Administered 2016-12-07: 2 [IU] via SUBCUTANEOUS
  Administered 2016-12-10: 1 [IU] via SUBCUTANEOUS

## 2016-12-07 MED ORDER — ORAL CARE MOUTH RINSE
15.0000 mL | Freq: Two times a day (BID) | OROMUCOSAL | Status: DC
  Administered 2016-12-07 – 2016-12-10 (×5): 15 mL via OROMUCOSAL

## 2016-12-07 MED ORDER — ONDANSETRON HCL 4 MG/2ML IJ SOLN: 4.0000 mg | Freq: Four times a day (QID) | INTRAMUSCULAR | Status: DC | PRN

## 2016-12-07 MED ORDER — SODIUM CHLORIDE 0.9 % IV SOLN
Freq: Once | INTRAVENOUS | Status: AC
  Administered 2016-12-07: 02:00:00 via INTRAVENOUS

## 2016-12-07 MED ORDER — SODIUM CHLORIDE 0.9 % IV SOLN
INTRAVENOUS | Status: DC
  Administered 2016-12-07: 06:00:00 via INTRAVENOUS
  Administered 2016-12-07 – 2016-12-08 (×2): 1000 mL via INTRAVENOUS
  Administered 2016-12-08 – 2016-12-09 (×3): via INTRAVENOUS
  Administered 2016-12-09: 1000 mL via INTRAVENOUS
  Administered 2016-12-10: 06:00:00 via INTRAVENOUS

## 2016-12-07 MED ORDER — IOPAMIDOL (ISOVUE-300) INJECTION 61%
INTRAVENOUS | Status: AC
  Administered 2016-12-07: 100 mL via INTRAVENOUS
  Filled 2016-12-07: qty 100

## 2016-12-07 MED ORDER — MORPHINE SULFATE (PF) 4 MG/ML IV SOLN
2.0000 mg | INTRAVENOUS | Status: DC | PRN
  Administered 2016-12-07: 4 mg via INTRAVENOUS
  Filled 2016-12-07: qty 1

## 2016-12-07 MED ORDER — CALCIUM CARBONATE-VITAMIN D 500-200 MG-UNIT PO TABS
1.0000 | ORAL_TABLET | Freq: Two times a day (BID) | ORAL | Status: DC
  Administered 2016-12-07 – 2016-12-10 (×6): 1 via ORAL
  Filled 2016-12-07 (×6): qty 1

## 2016-12-07 MED ORDER — CALCIUM CARBONATE ANTACID 500 MG PO CHEW: 2.0000 | CHEWABLE_TABLET | Freq: Three times a day (TID) | ORAL | Status: DC | PRN

## 2016-12-07 MED ORDER — PANTOPRAZOLE SODIUM 40 MG PO TBEC: 80.0000 mg | DELAYED_RELEASE_TABLET | Freq: Every day | ORAL | Status: DC

## 2016-12-07 MED ORDER — ONDANSETRON HCL 4 MG/2ML IJ SOLN
INTRAMUSCULAR | Status: AC
  Administered 2016-12-07: 4 mg
  Filled 2016-12-07: qty 2

## 2016-12-07 MED ORDER — PANTOPRAZOLE SODIUM 40 MG PO TBEC
40.0000 mg | DELAYED_RELEASE_TABLET | Freq: Two times a day (BID) | ORAL | Status: DC
  Administered 2016-12-07 – 2016-12-10 (×6): 40 mg via ORAL
  Filled 2016-12-07 (×6): qty 1

## 2016-12-07 MED ORDER — ONDANSETRON HCL 4 MG/2ML IJ SOLN
4.0000 mg | Freq: Once | INTRAMUSCULAR | Status: AC
  Administered 2016-12-07: 4 mg via INTRAVENOUS
  Filled 2016-12-07: qty 2

## 2016-12-07 MED ORDER — ROSUVASTATIN CALCIUM 20 MG PO TABS: 20.0000 mg | ORAL_TABLET | Freq: Every day | ORAL | Status: DC

## 2016-12-07 MED ORDER — MORPHINE SULFATE (PF) 4 MG/ML IV SOLN: 3.0000 mg | INTRAVENOUS | Status: DC | PRN

## 2016-12-07 MED ORDER — PROMETHAZINE HCL 25 MG/ML IJ SOLN
12.5000 mg | Freq: Once | INTRAMUSCULAR | Status: AC
  Administered 2016-12-07: 12.5 mg via INTRAVENOUS
  Filled 2016-12-07 (×2): qty 1

## 2016-12-07 NOTE — Consult Note (Signed)
Referring Provider: [facility]   Primary Care Physician:  Ann Held, DO Primary Gastroenterologist:   Scarlette Shorts,  MD  Reason for Consultation:  Abnormal LFTs, abdominal pain    Attending physician's note   I have taken a history, examined the patient and reviewed the chart. I agree with the Advanced Practitioner's note, impression and recommendations. Upper abdominal pain, N/V with cholelithiasis, elevated transminases, biliary tree dilation, probably choledocholithiasis. Suspected NASH with hepatomegaly. Given chronic thrombocytopenia and mild splenomegaly consider early cirrhosis. Check PT/INR. Surgical consult. ERCP tomorrow.   Lucio Edward, MD Mar[facility] Regal [phone] Mon-Fri 8a-5p [phone] after 5p, weekends, holidays    ASSESSMENT AND PLAN:    78. 66 yo female with upper abdominal pain / cholelithiasis / possible choledocholithiasis with filling defect in distal CBD.  She could have mild biliary pancreatitis as well given lipase of 200 though no pancreatitis on imaging.  -Will plan for ERCP with stone extraction tomorrow. Procedure will be done with GA/MAC around 1pm.  -am LFTs -INR ordered.    2. Hepatomegaly / hepatic steatosis / mild splenomegaly. Chronic thrombocytopenia. Wonder if she has NASH, developing cirrhosis? She certainly has risk factors for it. -INR  3. DM2  4. Normocytic anemia, chronic. Anemia of chronic disease? Hgb at baseline around 10  5. Hx of adenomatous colon polyps, including small one on last colonoscopy May 2014. For a 5 year recall colonoscopy   6. AKI superimposed on CKD ( ? Stage 2-3)  HPI: Rachel Black is a 66 y.o. female admitted with RUQ pain, elevated LFTs, elevated lipase. CTscan;with contrast showed intra and extrahepatic biliary duct dilation with CBD stone at port hepatis.  She developed epigastric / RUQ pain two weeks ago. Pain unrelated to meals, it is mainly noticeable at night when she lays down. Pain may lasts for  several hours, doesn't really radiate through to her back. She has had associated nausea / vomiting. No fevers or chills. Patient has chronic GERD, takes TUMs on a regular basis. She tried TUMS for the upper abdominal pain but they didn't help.   Past Medical History:  Diagnosis Date  . Arthritis    PAIN AND OA BOTH KNEES AND SHOULDERS AND ELBOWS AND WRIST  . Diabetes mellitus    ORAL MEDICATION  . GERD (gastroesophageal reflux disease)   . Gout    NO RECENT FLARE UPS  . H/O: rheumatic fever    AS A CHILD - NO KNOWN HEART MURMUR OR HEART PROBLEMS  . Hyperlipidemia   . Hypertension   . Shortness of breath    WITH EXERTION    Past Surgical History:  Procedure Laterality Date  . ABDOMINAL HYSTERECTOMY    . CESAREAN SECTION    . COLONOSCOPY WITH PROPOFOL N/A 01/30/2013   Procedure: COLONOSCOPY WITH PROPOFOL;  Surgeon: Irene Shipper, MD;  Location: [facility];  Service: Endoscopy;  Laterality: N/A;  . ESOPHAGOGASTRODUODENOSCOPY (EGD) WITH PROPOFOL N/A 01/30/2013   Procedure: ESOPHAGOGASTRODUODENOSCOPY (EGD) WITH PROPOFOL;  Surgeon: Irene Shipper, MD;  Location: [facility];  Service: Endoscopy;  Laterality: N/A;  . PARTIAL HYSTERECTOMY    . TOTAL KNEE ARTHROPLASTY Right 04/20/2014   Procedure: RIGHT TOTAL KNEE ARTHROPLASTY CONVERTED TO RIGHT KNEE REIMPLANTATION;  Surgeon: Mcarthur Rossetti, MD;  Location: [facility];  Service: Orthopedics;  Laterality: Right;  . TOTAL KNEE ARTHROPLASTY Left 08/23/2015   Procedure: LEFT TOTAL KNEE ARTHROPLASTY;  Surgeon: Mcarthur Rossetti, MD;  Location: [facility];  Service: Orthopedics;  Laterality: Left;  .  VENTRAL HERNIA REPAIR     x2    Prior to Admission medications   Medication Sig Start Date End Date Taking? Authorizing Provider  allopurinol (ZYLOPRIM) 300 MG tablet TAKE 1 TABLET (300 MG TOTAL) BY MOUTH 2 (TWO) TIMES DAILY. 10/13/16  Yes Yvonne R Lowne Chase, DO  calcium carbonate (TUMS - DOSED IN MG ELEMENTAL CALCIUM) 500 MG chewable tablet  Chew 2 tablets by mouth 3 (three) times daily as needed for indigestion or heartburn.   Yes Historical Provider, MD  calcium-vitamin D (OSCAL WITH D) 500-200 MG-UNIT per tablet Take 1 tablet by mouth 2 (two) times daily.   Yes Historical Provider, MD  DEXILANT 60 MG capsule TAKE 1 CAPSULE (60 MG TOTAL) BY MOUTH EVERY MORNING 01/17/16  Yes Yvonne R Lowne Chase, DO  fenofibrate 160 MG tablet TAKE 1 TABLET (160 MG TOTAL) BY MOUTH DAILY. 10/13/16  Yes Yvonne R Lowne Chase, DO  JANUMET XR [phone] MG TB24 TAKE 1 TABLET BY MOUTH EVERY DAY 08/31/16  Yes Yvonne R Lowne Chase, DO  ONE TOUCH ULTRA TEST test strip ONETOUCH ULTRA BLUE TEST STRIPS CHECK BLOOD SUGAR DAILY. DX: E11.9 07/09/16  Yes Yvonne R Lowne Chase, DO  [facility] DELICA LANCETS FINE MISC Check glucose qd 06/24/15  Yes Yvonne R Lowne Chase, DO  rosuvastatin (CRESTOR) 20 MG tablet Take 1 tablet (20 mg total) by mouth daily. 02/13/16  Yes Yvonne R Lowne Chase, DO  valsartan-hydrochlorothiazide (DIOVAN-HCT) 80-12.5 MG tablet Take 1 tablet by mouth daily. 10/29/16  Yes Rosalita Chessman Chase, DO    Current Facility-Administered Medications  Medication Dose Route Frequency Provider Last Rate Last Dose  . 0.9 %  sodium chloride infusion   Intravenous Continuous Etta Quill, DO 125 mL/hr at 12/07/16 0615    . allopurinol (ZYLOPRIM) tablet 300 mg  300 mg Oral BID Etta Quill, DO   300 mg at 12/07/16 1100  . calcium carbonate (TUMS - dosed in mg elemental calcium) chewable tablet 400 mg of elemental calcium  2 tablet Oral TID PRN Etta Quill, DO      . calcium-vitamin D (OSCAL WITH D) 500-200 MG-UNIT per tablet 1 tablet  1 tablet Oral BID Etta Quill, DO   1 tablet at 12/07/16 1059  . insulin aspart (novoLOG) injection 0-9 Units  0-9 Units Subcutaneous Q4H Etta Quill, DO   2 Units at 12/07/16 [phone]  . MEDLINE mouth rinse  15 mL Mouth Rinse BID Doreatha Lew, MD   15 mL at 12/07/16 1059  . morphine 4 MG/ML injection 3 mg  3 mg  Intravenous Q4H PRN Barton Dubois, MD      . ondansetron [facility]) injection 4 mg  4 mg Intravenous Q6H PRN Etta Quill, DO      . pantoprazole (PROTONIX) EC tablet 40 mg  40 mg Oral BID Barton Dubois, MD   40 mg at 12/07/16 1059    Allergies as of 12/07/2016 - Review Complete 12/07/2016  Allergen Reaction Noted  . Aspirin  01/04/2013  . Other  04/13/2014  . Penicillins Itching 01/04/2013    Family History  Problem Relation Age of Onset  . Diabetes Mother   . Hypertension Mother     entire family  . Stroke Father     CVA  . Hyperlipidemia Father     entire family  . Diabetes Father   . Heart disease Father   . Crohn's disease Son   . Irritable bowel syndrome Son  Social History   Social History  . Marital status: Married    Spouse name: N/A  . Number of children: 2  . Years of education: N/A   Occupational History  . housewife Unemployed   Social History Main Topics  . Smoking status: Never Smoker  . Smokeless tobacco: Never Used  . Alcohol use No  . Drug use: No  . Sexual activity: Yes    Partners: Male   Other Topics Concern  . Not on file   Social History Narrative   No exercise secondary to knee pain    Review of Systems: Frequent urination, no dysuria. All systems reviewed and negative except where noted in HPI.  Physical Exam: Vital signs in last 24 hours: Temp:  [97.6 F (36.4 C)-99 F (37.2 C)] 99 F (37.2 C) (03/26 0557) Pulse Rate:  [81-113] 113 (03/26 0557) Resp:  [16-24] 24 (03/26 0557) BP: (117-149)/(64-77) 149/66 (03/26 0557) SpO2:  [94 %-98 %] 96 % (03/26 0557) Weight:  [220 lb (99.8 kg)-272 lb 3.2 oz (123.5 kg)] 272 lb 3.2 oz (123.5 kg) (03/26 0600) Last BM Date: 12/06/16 General:   Alert,  Obese white female in NAD Eyes:  Sclera clear, no icterus.   Conjunctiva pink. Ears:  Normal auditory acuity. Nose:  No deformity, discharge,  or lesions. Mouth:  No deformity or lesions.   Neck:  Supple; no masses or  thyromegaly. Lungs:  Clear throughout to auscultation.   No wheezes, crackles, or rhonchi.  Heart:  Regular rate and rhythm; no murmurs, clicks, rubs,  or gallops. Abdomen:  Soft, mild epigastric tenderness. BS active,   Rectal:  Deferred  Msk:  Symmetrical without gross deformities. . Extremities:  Without clubbing or edema. Neurologic:  Alert and  oriented x4;  grossly normal neurologically. Skin:  Intact without significant lesions or rashes.. Psych:  Alert and cooperative. Normal mood and affect.  Intake/Output from previous day: 03/25 0701 - 03/26 0700 In: 1000 [I.V.:1000] Out: -  Intake/Output this shift: Total I/O In: 0  Out: 300 [Urine:300]  Lab Results:  Recent Labs  12/07/16 0053  WBC 10.0  HGB 10.0*  HCT 31.7*  PLT 120*   BMET  Recent Labs  12/07/16 0053  NA 140  K 3.4*  CL 107  CO2 22  GLUCOSE 141*  BUN 22*  CREATININE 1.51*  CALCIUM 9.5   LFT  Recent Labs  12/07/16 0053  PROT 6.7  ALBUMIN 3.8  AST 231*  ALT 161*  ALKPHOS 89  BILITOT 1.2     Studies/Results: Ct Abdomen Pelvis W Contrast  Result Date: 12/07/2016 CLINICAL DATA:  Right upper quadrant pain elevated lipase and elevated LFTs. EXAM: CT ABDOMEN AND PELVIS WITH CONTRAST TECHNIQUE: Multidetector CT imaging of the abdomen and pelvis was performed using the standard protocol following bolus administration of intravenous contrast. CONTRAST:  80 cc Isovue-300 IV COMPARISON:  None. FINDINGS: Lower chest: Mild dependent atelectasis at the lung bases. No pleural fluid. Hepatobiliary: The liver is enlarged spanning 25.7 cm cranial caudal. There is diffusely decreased hepatic density. No evidence of focal hepatic lesion. There is intra and extrahepatic biliary ductal dilatation, with common bile duct measuring approximately 17 mm at the porta hepatis. Gallbladder is mildly distended. No calcified gallstone. Questionable soft tissue density/filling defect involving the distal common bile duct.  Pancreas: Proximal pancreatic duct is borderline measuring 4 mm. No peripancreatic inflammation. No discrete pancreatic lesion. Spleen: Mildly enlarged with lobular contours. Maximal AP dimension 14 cm. No focal splenic abnormality. Adrenals/Urinary  Tract: Normal adrenal glands. No hydronephrosis. Mild nonspecific perinephric edema about both kidneys. Tiny cortical hypodensity mid left kidney is too small to accurately characterize, likely simple cysts. Urinary bladder is decompressed. Stomach/Bowel: Fluid within the distal esophagus, small hiatal hernia. Stomach is distended with ingested contents. Colonic diverticulosis from the splenic flexure through the sigmoid without acute inflammation. Small bowel is decompressed. Vascular/Lymphatic: No significant vascular findings are present. No enlarged abdominal or pelvic lymph nodes. Reproductive: Status post hysterectomy. No adnexal masses. Other: No ascites. No free air or intra-abdominal fluid collection. Postsurgical changes of ventral abdominal wall without discrete hernia. Musculoskeletal: There are no acute or suspicious osseous abnormalities. Degenerative change in the lumbar spine with primarily facet arthropathy. IMPRESSION: 1. Intra and extrahepatic biliary dilatation with possible soft tissue density/filling defect in the distal common bile duct. Ultrasound recommended for initial evaluation, ultimately MRCP may be needed for more detailed evaluation of the biliary tree. Prominence of the proximal pancreatic duct without peripancreatic inflammation. 2. Hepatomegaly and hepatic steatosis.  Mild splenomegaly. 3. Small hiatal hernia. Fluid in the distal esophagus can be seen with reflux or delayed transit. 4. Colonic diverticulosis without acute inflammation. Electronically Signed   By: Jeb Levering M.D.   On: 12/07/2016 03:09   US Abdomen Limited Ruq  Result Date: 12/07/2016 CLINICAL DATA:  Right upper quadrant abdominal pain. EXAM: US ABDOMEN LIMITED  - RIGHT UPPER QUADRANT COMPARISON:  CT abdomen/ pelvis earlier this day. FINDINGS: Gallbladder: Gallbladder is distended and contains sludge and small stones. Borderline gallbladder wall thickness of 3 mm. No sonographic Murphy sign noted by sonographer. No pericholecystic fluid. Common bile duct: Diameter: 12 mm at the porta hepatis. Distal common bile duct is not well visualized. Liver: No focal lesion identified. Diffusely increased in parenchymal echogenicity. Intrahepatic biliary ductal dilatation, better appreciated on CT. Normal directional flow in the main portal vein. IMPRESSION: 1. Stones and sludge within the gallbladder with borderline gallbladder wall thickening. Negative sonographic Murphy sign. 2. Biliary dilatation, cause not identified sonographically. MRCP may be of value for further evaluation. 3. Hepatic steatosis. Electronically Signed   By: Jeb Levering M.D.   On: 12/07/2016 04:42    Tye Savoy, NP-C @  12/07/2016, 12:00 PM  Pager number [phone]

## 2016-12-07 NOTE — Progress Notes (Signed)
Patient seen and examined. Admitted after midnight secondary to abd pain, nausea and vomiting. Found to have pancreatitis, cholelithiasis and choledocholithiasis. LFT elevated and suspicious for NASH and early cirrhosis. Patient reports feeling better. No nausea or vomiting currently. GI consulted and planning ERCP tomorrow; will also consult general surgery for potential cholecystectomy during this admission. Please for further info/details on admission please refer to H&P written by Dr. Alcario Drought on 3.26.18  Rachel Black [phone]

## 2016-12-07 NOTE — Progress Notes (Signed)
Initial Nutrition Assessment  DOCUMENTATION CODES:   Morbid obesity  INTERVENTION:   Diet advancement per MD Will discuss nutrition supplement options at follow-up  NUTRITION DIAGNOSIS:   Inadequate oral intake related to nausea, vomiting as evidenced by per patient/family report.  GOAL:   Patient will meet greater than or equal to 90% of their needs  MONITOR:   Diet advancement, Labs, Weight trends, I & O's  REASON FOR ASSESSMENT:   Malnutrition Screening Tool    ASSESSMENT:   66 y.o. female with medical history significant of DM2, obesity, HTN, gout.  Patient presents to the ED with c/o 9/10 severity, sharp quality abdominal pain.  Pain located in epigastric and RUQ.  Pain radiates to back.  Pain worsened over past several hours.  Onset initially 2-3 weeks ago, had been waxing and waning.  Associated nausea and vomiting including in ED.  Patient reports eating less than usual PTA. States that prior to developing abdominal pain she would eat 2 meals a day, usually skipping breakfast. Since symptoms began she was eating once a day with a small snack later in the day (typically, soup, 1/2 sandwich, or pice of chicken). Pt was unable to tolerate foods for a couple of weeks PTA d/t N/V. Pt reports no appetite at this time. Currently NPO.  Pt was unable to give a UBW. Per chart review, weight has been stable over the past year. Nutrition focused physical exam shows no sign of depletion of muscle mass or body fat.  Medications: OSCAL w/ D tablet BID, IV Zofran once, Protonix tablet BID Labs reviewed: CBGs: 176-186 Low K GFR: 35  Diet Order:  Diet NPO time specified Except for: Sips with Meds  Skin:  Reviewed, no issues  Last BM:  3/25  Height:   Ht Readings from Last 1 Encounters:  12/07/16 5\' 6"  (1.676 m)    Weight:   Wt Readings from Last 1 Encounters:  12/07/16 272 lb 3.2 oz (123.5 kg)    Ideal Body Weight:  59.1 kg  BMI:  Body mass index is 43.93  kg/m.  Estimated Nutritional Needs:   Kcal:  1900-2100  Protein:  75-85g  Fluid:  2L/day  EDUCATION NEEDS:   No education needs identified at this time  Clayton Bibles, MS, RD, LDN Pager: [phone] After Hours Pager: [phone]

## 2016-12-07 NOTE — ED Provider Notes (Signed)
[facility] DEPT Provider Note: Georgena Spurling, MD, FACEP  CSN: [id number] MRN: [id number] ARRIVAL: 12/07/16 at [place]: [id number]/1534-01  By signing my name below, I, Eunice Blase, attest that this documentation has been prepared under the direction and in the presence of Shanon Rosser, MD. Electronically signed, Eunice Blase, ED Scribe. 12/07/16. 1:06 AM  CHIEF COMPLAINT  Abdominal Pain   HISTORY OF PRESENT ILLNESS  HPI Comments: Rachel Black is a 66 y.o. female who presents to the Emergency Department complaining of worsening, waxing and waning upper abdominal pain pain x 2-3 weeks. The pain worsened over the past several hours and she rates her pain as 9/10, describes it as sharp and states this pain is worsened at night, when lying down, when applying pressure and when eating. She notes associated nausea, some vomiting daily including PTA and diarrhea that she last had on 12/05/2016. Hx of 2 ventral hernia surgeries with mesh placement noted. Pt denies any fever at home.  Past Medical History:  Diagnosis Date  . Arthritis    PAIN AND OA BOTH KNEES AND SHOULDERS AND ELBOWS AND WRIST  . Diabetes mellitus    ORAL MEDICATION  . GERD (gastroesophageal reflux disease)   . Gout    NO RECENT FLARE UPS  . H/O: rheumatic fever    AS A CHILD - NO KNOWN HEART MURMUR OR HEART PROBLEMS  . Hyperlipidemia   . Hypertension   . Shortness of breath    WITH EXERTION    Past Surgical History:  Procedure Laterality Date  . ABDOMINAL HYSTERECTOMY    . CESAREAN SECTION    . COLONOSCOPY WITH PROPOFOL N/A 01/30/2013   Procedure: COLONOSCOPY WITH PROPOFOL;  Surgeon: Irene Shipper, MD;  Location: [facility];  Service: Endoscopy;  Laterality: N/A;  . ESOPHAGOGASTRODUODENOSCOPY (EGD) WITH PROPOFOL N/A 01/30/2013   Procedure: ESOPHAGOGASTRODUODENOSCOPY (EGD) WITH PROPOFOL;  Surgeon: Irene Shipper, MD;  Location: [facility];  Service: Endoscopy;  Laterality: N/A;  . PARTIAL HYSTERECTOMY    .  TOTAL KNEE ARTHROPLASTY Right 04/20/2014   Procedure: RIGHT TOTAL KNEE ARTHROPLASTY CONVERTED TO RIGHT KNEE REIMPLANTATION;  Surgeon: Mcarthur Rossetti, MD;  Location: [facility];  Service: Orthopedics;  Laterality: Right;  . TOTAL KNEE ARTHROPLASTY Left 08/23/2015   Procedure: LEFT TOTAL KNEE ARTHROPLASTY;  Surgeon: Mcarthur Rossetti, MD;  Location: [facility];  Service: Orthopedics;  Laterality: Left;  Marland Kitchen VENTRAL HERNIA REPAIR     x2    Family History  Problem Relation Age of Onset  . Diabetes Mother   . Hypertension Mother     entire family  . Stroke Father     CVA  . Hyperlipidemia Father     entire family  . Diabetes Father   . Heart disease Father   . Crohn's disease Son   . Irritable bowel syndrome Son     Social History  Substance Use Topics  . Smoking status: Never Smoker  . Smokeless tobacco: Never Used  . Alcohol use No    Prior to Admission medications   Medication Sig Start Date End Date Taking? Authorizing Provider  allopurinol (ZYLOPRIM) 300 MG tablet TAKE 1 TABLET (300 MG TOTAL) BY MOUTH 2 (TWO) TIMES DAILY. 10/13/16  Yes Yvonne R Lowne Chase, DO  calcium carbonate (TUMS - DOSED IN MG ELEMENTAL CALCIUM) 500 MG chewable tablet Chew 2 tablets by mouth 3 (three) times daily as needed for indigestion or heartburn.   Yes Historical Provider, MD  calcium-vitamin D (OSCAL WITH D) 500-200  MG-UNIT per tablet Take 1 tablet by mouth 2 (two) times daily.   Yes Historical Provider, MD  DEXILANT 60 MG capsule TAKE 1 CAPSULE (60 MG TOTAL) BY MOUTH EVERY MORNING 01/17/16  Yes Yvonne R Lowne Chase, DO  fenofibrate 160 MG tablet TAKE 1 TABLET (160 MG TOTAL) BY MOUTH DAILY. 10/13/16  Yes Yvonne R Lowne Chase, DO  JANUMET XR [phone] MG TB24 TAKE 1 TABLET BY MOUTH EVERY DAY 08/31/16  Yes Yvonne R Lowne Chase, DO  ONE TOUCH ULTRA TEST test strip ONETOUCH ULTRA BLUE TEST STRIPS CHECK BLOOD SUGAR DAILY. DX: E11.9 07/09/16  Yes Yvonne R Lowne Chase, DO  [facility] DELICA LANCETS FINE MISC  Check glucose qd 06/24/15  Yes Yvonne R Lowne Chase, DO  rosuvastatin (CRESTOR) 20 MG tablet Take 1 tablet (20 mg total) by mouth daily. 02/13/16  Yes Yvonne R Lowne Chase, DO  valsartan-hydrochlorothiazide (DIOVAN-HCT) 80-12.5 MG tablet Take 1 tablet by mouth daily. 10/29/16  Yes Rosalita Chessman Chase, DO    Allergies Aspirin; Other; and Penicillins   REVIEW OF SYSTEMS  Negative except as noted here or in the History of Present Illness.   PHYSICAL EXAMINATION  Initial Vital Signs Blood pressure (!) 142/72, pulse 93, temperature 97.6 F (36.4 C), temperature source Oral, resp. rate 16, height 5\' 6"  (1.676 m), weight 220 lb (99.8 kg), SpO2 98 %.  Examination General: Well-developed, well-nourished female in no acute distress; appearance consistent with age of record HENT: normocephalic; atraumatic Eyes: pupils equal, round and reactive to light; extraocular muscles intact Neck: supple Heart: regular rate and rhythm Lungs: clear to auscultation bilaterally Abdomen: soft; nondistended; RUQ tenderness with positive Murphy's sign; no masses or hepatosplenomegaly; bowel sounds present Extremities: No deformity; full range of motion; pulses normal Neurologic: Awake, alert and oriented; motor function intact in all extremities and symmetric; no facial droop Skin: Warm and dry, pale Psychiatric: Normal mood and affect   RESULTS  Summary of this visit's results, reviewed by myself:   EKG Interpretation  Date/Time:    Ventricular Rate:    PR Interval:    QRS Duration:   QT Interval:    QTC Calculation:   R Axis:     Text Interpretation:        Laboratory Studies: Results for orders placed or performed during the hospital encounter of 12/07/16 (from the past 24 hour(s))  Urinalysis, Routine w reflex microscopic     Status: Abnormal   Collection Time: 12/07/16 12:38 AM  Result Value Ref Range   Color, Urine AMBER (A) YELLOW   APPearance CLOUDY (A) CLEAR   Specific Gravity, Urine  1.027 1.005 - 1.030   pH 5.0 5.0 - 8.0   Glucose, UA NEGATIVE NEGATIVE mg/dL   Hgb urine dipstick NEGATIVE NEGATIVE   Bilirubin Urine SMALL (A) NEGATIVE   Ketones, ur NEGATIVE NEGATIVE mg/dL   Protein, ur NEGATIVE NEGATIVE mg/dL   Nitrite NEGATIVE NEGATIVE   Leukocytes, UA MODERATE (A) NEGATIVE   RBC / HPF 0-5 0 - 5 RBC/hpf   WBC, UA 6-30 0 - 5 WBC/hpf   Bacteria, UA RARE (A) NONE SEEN   Squamous Epithelial / LPF 6-30 (A) NONE SEEN   Mucous PRESENT    Hyaline Casts, UA PRESENT   Lipase, blood     Status: Abnormal   Collection Time: 12/07/16 12:53 AM  Result Value Ref Range   Lipase 277 (H) 11 - 51 U/L  Comprehensive metabolic panel     Status: Abnormal   Collection Time:  12/07/16 12:53 AM  Result Value Ref Range   Sodium 140 135 - 145 mmol/L   Potassium 3.4 (L) 3.5 - 5.1 mmol/L   Chloride 107 101 - 111 mmol/L   CO2 22 22 - 32 mmol/L   Glucose, Bld 141 (H) 65 - 99 mg/dL   BUN 22 (H) 6 - 20 mg/dL   Creatinine, Ser 1.51 (H) 0.44 - 1.00 mg/dL   Calcium 9.5 8.9 - 10.3 mg/dL   Total Protein 6.7 6.5 - 8.1 g/dL   Albumin 3.8 3.5 - 5.0 g/dL   AST 231 (H) 15 - 41 U/L   ALT 161 (H) 14 - 54 U/L   Alkaline Phosphatase 89 38 - 126 U/L   Total Bilirubin 1.2 0.3 - 1.2 mg/dL   GFR calc non Af Amer 35 (L) >60 mL/min   GFR calc Af Amer 41 (L) >60 mL/min   Anion gap 11 5 - 15  CBC     Status: Abnormal   Collection Time: 12/07/16 12:53 AM  Result Value Ref Range   WBC 10.0 4.0 - 10.5 K/uL   RBC 3.81 (L) 3.87 - 5.11 MIL/uL   Hemoglobin 10.0 (L) 12.0 - 15.0 g/dL   HCT 31.7 (L) 36.0 - 46.0 %   MCV 83.2 78.0 - 100.0 fL   MCH 26.2 26.0 - 34.0 pg   MCHC 31.5 30.0 - 36.0 g/dL   RDW 16.6 (H) 11.5 - 15.5 %   Platelets 120 (L) 150 - 400 K/uL  Differential     Status: None   Collection Time: 12/07/16 12:53 AM  Result Value Ref Range   Neutrophils Relative % 74 %   Neutro Abs 7.4 1.7 - 7.7 K/uL   Lymphocytes Relative 21 %   Lymphs Abs 2.1 0.7 - 4.0 K/uL   Monocytes Relative 5 %   Monocytes  Absolute 0.5 0.1 - 1.0 K/uL   Smear Review MORPHOLOGY UNREMARKABLE   Glucose, capillary     Status: Abnormal   Collection Time: 12/07/16  6:08 AM  Result Value Ref Range   Glucose-Capillary 186 (H) 65 - 99 mg/dL  Glucose, capillary     Status: Abnormal   Collection Time: 12/07/16  7:48 AM  Result Value Ref Range   Glucose-Capillary 176 (H) 65 - 99 mg/dL   Imaging Studies: Ct Abdomen Pelvis W Contrast  Result Date: 12/07/2016 CLINICAL DATA:  Right upper quadrant pain elevated lipase and elevated LFTs. EXAM: CT ABDOMEN AND PELVIS WITH CONTRAST TECHNIQUE: Multidetector CT imaging of the abdomen and pelvis was performed using the standard protocol following bolus administration of intravenous contrast. CONTRAST:  80 cc Isovue-300 IV COMPARISON:  None. FINDINGS: Lower chest: Mild dependent atelectasis at the lung bases. No pleural fluid. Hepatobiliary: The liver is enlarged spanning 25.7 cm cranial caudal. There is diffusely decreased hepatic density. No evidence of focal hepatic lesion. There is intra and extrahepatic biliary ductal dilatation, with common bile duct measuring approximately 17 mm at the porta hepatis. Gallbladder is mildly distended. No calcified gallstone. Questionable soft tissue density/filling defect involving the distal common bile duct. Pancreas: Proximal pancreatic duct is borderline measuring 4 mm. No peripancreatic inflammation. No discrete pancreatic lesion. Spleen: Mildly enlarged with lobular contours. Maximal AP dimension 14 cm. No focal splenic abnormality. Adrenals/Urinary Tract: Normal adrenal glands. No hydronephrosis. Mild nonspecific perinephric edema about both kidneys. Tiny cortical hypodensity mid left kidney is too small to accurately characterize, likely simple cysts. Urinary bladder is decompressed. Stomach/Bowel: Fluid within the distal esophagus, small hiatal  hernia. Stomach is distended with ingested contents. Colonic diverticulosis from the splenic flexure  through the sigmoid without acute inflammation. Small bowel is decompressed. Vascular/Lymphatic: No significant vascular findings are present. No enlarged abdominal or pelvic lymph nodes. Reproductive: Status post hysterectomy. No adnexal masses. Other: No ascites. No free air or intra-abdominal fluid collection. Postsurgical changes of ventral abdominal wall without discrete hernia. Musculoskeletal: There are no acute or suspicious osseous abnormalities. Degenerative change in the lumbar spine with primarily facet arthropathy. IMPRESSION: 1. Intra and extrahepatic biliary dilatation with possible soft tissue density/filling defect in the distal common bile duct. Ultrasound recommended for initial evaluation, ultimately MRCP may be needed for more detailed evaluation of the biliary tree. Prominence of the proximal pancreatic duct without peripancreatic inflammation. 2. Hepatomegaly and hepatic steatosis.  Mild splenomegaly. 3. Small hiatal hernia. Fluid in the distal esophagus can be seen with reflux or delayed transit. 4. Colonic diverticulosis without acute inflammation. Electronically Signed   By: Jeb Levering M.D.   On: 12/07/2016 03:09   US Abdomen Limited Ruq  Result Date: 12/07/2016 CLINICAL DATA:  Right upper quadrant abdominal pain. EXAM: US ABDOMEN LIMITED - RIGHT UPPER QUADRANT COMPARISON:  CT abdomen/ pelvis earlier this day. FINDINGS: Gallbladder: Gallbladder is distended and contains sludge and small stones. Borderline gallbladder wall thickness of 3 mm. No sonographic Murphy sign noted by sonographer. No pericholecystic fluid. Common bile duct: Diameter: 12 mm at the porta hepatis. Distal common bile duct is not well visualized. Liver: No focal lesion identified. Diffusely increased in parenchymal echogenicity. Intrahepatic biliary ductal dilatation, better appreciated on CT. Normal directional flow in the main portal vein. IMPRESSION: 1. Stones and sludge within the gallbladder with  borderline gallbladder wall thickening. Negative sonographic Murphy sign. 2. Biliary dilatation, cause not identified sonographically. MRCP may be of value for further evaluation. 3. Hepatic steatosis. Electronically Signed   By: Jeb Levering M.D.   On: 12/07/2016 04:42    ED COURSE  Nursing notes and initial vitals signs, including pulse oximetry, reviewed.  Vitals:   12/07/16 0430 12/07/16 0503 12/07/16 0557 12/07/16 0600  BP: 128/68 132/77 (!) 149/66   Pulse: 81 82 (!) 113   Resp:  18 (!) 24   Temp:   99 F (37.2 C)   TempSrc:   Oral   SpO2: 94% 96% 96%   Weight:    272 lb 3.2 oz (123.5 kg)  Height:    5\' 6"  (1.676 m)   Hospitalist to admit. Will likely need ERCP.  PROCEDURES    ED DIAGNOSES     ICD-9-CM ICD-10-CM   1. Dilation of biliary tract 576.8 K83.8   2. RUQ abdominal pain 789.01 R10.11 US Abdomen Limited RUQ     US Abdomen Limited RUQ  3. Elevated LFTs 790.6 R79.89   4. Elevated lipase 790.5 R74.8      I personally performed the services described in this documentation, which was scribed in my presence. The recorded information has been reviewed and is accurate.    Shanon Rosser, MD 12/07/16 [phone]

## 2016-12-07 NOTE — ED Notes (Signed)
Dr. Jared Gardner, hospitalist, at bedside. 

## 2016-12-07 NOTE — H&P (Signed)
History and Physical    [name] ZYY:[id number] DOB: 1951-05-05 DOA: 12/07/2016  PCP: Ann Held, DO  Patient coming from: Home  I have personally briefly reviewed patient's old medical records in [place]  Chief Complaint: Abdominal pain  HPI: Rachel Black is a 66 y.o. female with medical history significant of DM2, obesity, HTN, gout.  Patient presents to the ED with c/o 9/10 severity, sharp quality abdominal pain.  Pain located in epigastric and RUQ.  Pain radiates to back.  Pain worsened over past several hours.  Onset initially 2-3 weeks ago, had been waxing and waning.  Associated nausea and vomiting including in ED.   ED Course: LFTs in the 200s, Lipase also in the 200s.  CT scan shows bile duct dilatation, and suspicious for flow void in distal duct.  No radio-opaque stones seen on CT scan; however, small gall stones and sludge are evident on [place] in the gall bladder, though the distal CBD is unable to be visualized on [place].   Review of Systems: As per HPI otherwise 10 point review of systems negative.   Past Medical History:  Diagnosis Date  . Arthritis    PAIN AND OA BOTH KNEES AND SHOULDERS AND ELBOWS AND WRIST  . Diabetes mellitus    ORAL MEDICATION  . GERD (gastroesophageal reflux disease)   . Gout    NO RECENT FLARE UPS  . H/O: rheumatic fever    AS A CHILD - NO KNOWN HEART MURMUR OR HEART PROBLEMS  . Hyperlipidemia   . Hypertension   . Shortness of breath    WITH EXERTION    Past Surgical History:  Procedure Laterality Date  . ABDOMINAL HYSTERECTOMY    . CESAREAN SECTION    . COLONOSCOPY WITH PROPOFOL N/A 01/30/2013   Procedure: COLONOSCOPY WITH PROPOFOL;  Surgeon: Irene Shipper, MD;  Location: [facility];  Service: Endoscopy;  Laterality: N/A;  . ESOPHAGOGASTRODUODENOSCOPY (EGD) WITH PROPOFOL N/A 01/30/2013   Procedure: ESOPHAGOGASTRODUODENOSCOPY (EGD) WITH PROPOFOL;  Surgeon: Irene Shipper, MD;  Location: [facility];  Service:  Endoscopy;  Laterality: N/A;  . PARTIAL HYSTERECTOMY    . TOTAL KNEE ARTHROPLASTY Right 04/20/2014   Procedure: RIGHT TOTAL KNEE ARTHROPLASTY CONVERTED TO RIGHT KNEE REIMPLANTATION;  Surgeon: Mcarthur Rossetti, MD;  Location: [facility];  Service: Orthopedics;  Laterality: Right;  . TOTAL KNEE ARTHROPLASTY Left 08/23/2015   Procedure: LEFT TOTAL KNEE ARTHROPLASTY;  Surgeon: Mcarthur Rossetti, MD;  Location: [facility];  Service: Orthopedics;  Laterality: Left;  Marland Kitchen VENTRAL HERNIA REPAIR     x2     reports that she has never smoked. She has never used smokeless tobacco. She reports that she does not drink alcohol or use drugs.  Allergies  Allergen Reactions  . Aspirin     Regular ASA-stomach upset  . Other     SOME BANDAIDS CAUSE SKIN IRRITATION  . Penicillins Itching    Has patient had a PCN reaction causing immediate rash, facial/tongue/throat swelling, SOB or lightheadedness with hypotension: No Has patient had a PCN reaction causing severe rash involving mucus membranes or skin necrosis: No Has patient had a PCN reaction that required hospitalization No Has patient had a PCN reaction occurring within the last 10 years: No If all of the above answers are "NO", then may proceed with Cephalosporin use.     Family History  Problem Relation Age of Onset  . Diabetes Mother   . Hypertension Mother  entire family  . Stroke Father     CVA  . Hyperlipidemia Father     entire family  . Diabetes Father   . Heart disease Father   . Crohn's disease Son   . Irritable bowel syndrome Son      Prior to Admission medications   Medication Sig Start Date End Date Taking? Authorizing Provider  allopurinol (ZYLOPRIM) 300 MG tablet TAKE 1 TABLET (300 MG TOTAL) BY MOUTH 2 (TWO) TIMES DAILY. 10/13/16  Yes Yvonne R Lowne Chase, DO  calcium carbonate (TUMS - DOSED IN MG ELEMENTAL CALCIUM) 500 MG chewable tablet Chew 2 tablets by mouth 3 (three) times daily as needed for indigestion or heartburn.    Yes Historical Provider, MD  calcium-vitamin D (OSCAL WITH D) 500-200 MG-UNIT per tablet Take 1 tablet by mouth 2 (two) times daily.   Yes Historical Provider, MD  DEXILANT 60 MG capsule TAKE 1 CAPSULE (60 MG TOTAL) BY MOUTH EVERY MORNING 01/17/16  Yes Yvonne R Lowne Chase, DO  fenofibrate 160 MG tablet TAKE 1 TABLET (160 MG TOTAL) BY MOUTH DAILY. 10/13/16  Yes Yvonne R Lowne Chase, DO  JANUMET XR [phone] MG TB24 TAKE 1 TABLET BY MOUTH EVERY DAY 08/31/16  Yes Yvonne R Lowne Chase, DO  ONE TOUCH ULTRA TEST test strip ONETOUCH ULTRA BLUE TEST STRIPS CHECK BLOOD SUGAR DAILY. DX: E11.9 07/09/16  Yes Yvonne R Lowne Chase, DO  [facility] DELICA LANCETS FINE MISC Check glucose qd 06/24/15  Yes Yvonne R Lowne Chase, DO  rosuvastatin (CRESTOR) 20 MG tablet Take 1 tablet (20 mg total) by mouth daily. 02/13/16  Yes Yvonne R Lowne Chase, DO  valsartan-hydrochlorothiazide (DIOVAN-HCT) 80-12.5 MG tablet Take 1 tablet by mouth daily. 10/29/16  Yes Ann Held, DO    Physical Exam: Vitals:   12/07/16 0259 12/07/16 0330 12/07/16 0430 12/07/16 0503  BP: (!) 141/64 117/70 128/68 132/77  Pulse: 88 85 81 82  Resp: 18   18  Temp:      TempSrc:      SpO2: 97% 95% 94% 96%  Weight:      Height:        Constitutional: NAD, calm, comfortable Eyes: PERRL, lids and conjunctivae normal ENMT: Mucous membranes are moist. Posterior pharynx clear of any exudate or lesions.Normal dentition.  Neck: normal, supple, no masses, no thyromegaly Respiratory: clear to auscultation bilaterally, no wheezing, no crackles. Normal respiratory effort. No accessory muscle use.  Cardiovascular: Regular rate and rhythm, no murmurs / rubs / gallops. No extremity edema. 2+ pedal pulses. No carotid bruits.  Abdomen: Epigastric and RUQ tenderness. Musculoskeletal: no clubbing / cyanosis. No joint deformity upper and lower extremities. Good ROM, no contractures. Normal muscle tone.  Skin: no rashes, lesions, ulcers. No  induration Neurologic: CN 2-12 grossly intact. Sensation intact, DTR normal. Strength 5/5 in all 4.  Psychiatric: Normal judgment and insight. Alert and oriented x 3. Normal mood.    Labs on Admission: I have personally reviewed following labs and imaging studies  CBC:  Recent Labs Lab 12/07/16 0053  WBC 10.0  NEUTROABS 7.4  HGB 10.0*  HCT 31.7*  MCV 83.2  PLT 735*   Basic Metabolic Panel:  Recent Labs Lab 12/07/16 0053  NA 140  K 3.4*  CL 107  CO2 22  GLUCOSE 141*  BUN 22*  CREATININE 1.51*  CALCIUM 9.5   GFR: Estimated Creatinine Clearance: 44.9 mL/min (A) (by C-G formula based on SCr of 1.51 mg/dL (H)). Liver Function Tests:  Recent Labs Lab 12/07/16 0053  AST 231*  ALT 161*  ALKPHOS 89  BILITOT 1.2  PROT 6.7  ALBUMIN 3.8    Recent Labs Lab 12/07/16 0053  LIPASE 277*   No results for input(s): AMMONIA in the last 168 hours. Coagulation Profile: No results for input(s): INR, PROTIME in the last 168 hours. Cardiac Enzymes: No results for input(s): CKTOTAL, CKMB, CKMBINDEX, TROPONINI in the last 168 hours. BNP (last 3 results) No results for input(s): PROBNP in the last 8760 hours. HbA1C: No results for input(s): HGBA1C in the last 72 hours. CBG: No results for input(s): GLUCAP in the last 168 hours. Lipid Profile: No results for input(s): CHOL, HDL, LDLCALC, TRIG, CHOLHDL, LDLDIRECT in the last 72 hours. Thyroid Function Tests: No results for input(s): TSH, T4TOTAL, FREET4, T3FREE, THYROIDAB in the last 72 hours. Anemia Panel: No results for input(s): VITAMINB12, FOLATE, FERRITIN, TIBC, IRON, RETICCTPCT in the last 72 hours. Urine analysis:    Component Value Date/Time   COLORURINE AMBER (A) 12/07/2016 0038   APPEARANCEUR CLOUDY (A) 12/07/2016 0038   LABSPEC 1.027 12/07/2016 0038   PHURINE 5.0 12/07/2016 0038   GLUCOSEU NEGATIVE 12/07/2016 0038   HGBUR NEGATIVE 12/07/2016 0038   BILIRUBINUR SMALL (A) 12/07/2016 0038   BILIRUBINUR neg  12/09/2015 1701   KETONESUR NEGATIVE 12/07/2016 0038   PROTEINUR NEGATIVE 12/07/2016 0038   UROBILINOGEN 0.2 12/09/2015 1701   UROBILINOGEN 0.2 04/13/2014 1422   NITRITE NEGATIVE 12/07/2016 0038   LEUKOCYTESUR MODERATE (A) 12/07/2016 0038    Radiological Exams on Admission: Ct Abdomen Pelvis W Contrast  Result Date: 12/07/2016 CLINICAL DATA:  Right upper quadrant pain elevated lipase and elevated LFTs. EXAM: CT ABDOMEN AND PELVIS WITH CONTRAST TECHNIQUE: Multidetector CT imaging of the abdomen and pelvis was performed using the standard protocol following bolus administration of intravenous contrast. CONTRAST:  80 cc Isovue-300 IV COMPARISON:  None. FINDINGS: Lower chest: Mild dependent atelectasis at the lung bases. No pleural fluid. Hepatobiliary: The liver is enlarged spanning 25.7 cm cranial caudal. There is diffusely decreased hepatic density. No evidence of focal hepatic lesion. There is intra and extrahepatic biliary ductal dilatation, with common bile duct measuring approximately 17 mm at the porta hepatis. Gallbladder is mildly distended. No calcified gallstone. Questionable soft tissue density/filling defect involving the distal common bile duct. Pancreas: Proximal pancreatic duct is borderline measuring 4 mm. No peripancreatic inflammation. No discrete pancreatic lesion. Spleen: Mildly enlarged with lobular contours. Maximal AP dimension 14 cm. No focal splenic abnormality. Adrenals/Urinary Tract: Normal adrenal glands. No hydronephrosis. Mild nonspecific perinephric edema about both kidneys. Tiny cortical hypodensity mid left kidney is too small to accurately characterize, likely simple cysts. Urinary bladder is decompressed. Stomach/Bowel: Fluid within the distal esophagus, small hiatal hernia. Stomach is distended with ingested contents. Colonic diverticulosis from the splenic flexure through the sigmoid without acute inflammation. Small bowel is decompressed. Vascular/Lymphatic: No  significant vascular findings are present. No enlarged abdominal or pelvic lymph nodes. Reproductive: Status post hysterectomy. No adnexal masses. Other: No ascites. No free air or intra-abdominal fluid collection. Postsurgical changes of ventral abdominal wall without discrete hernia. Musculoskeletal: There are no acute or suspicious osseous abnormalities. Degenerative change in the lumbar spine with primarily facet arthropathy. IMPRESSION: 1. Intra and extrahepatic biliary dilatation with possible soft tissue density/filling defect in the distal common bile duct. Ultrasound recommended for initial evaluation, ultimately MRCP may be needed for more detailed evaluation of the biliary tree. Prominence of the proximal pancreatic duct without peripancreatic inflammation. 2. Hepatomegaly and  hepatic steatosis.  Mild splenomegaly. 3. Small hiatal hernia. Fluid in the distal esophagus can be seen with reflux or delayed transit. 4. Colonic diverticulosis without acute inflammation. Electronically Signed   By: Jeb Levering M.D.   On: 12/07/2016 03:09   US Abdomen Limited Ruq  Result Date: 12/07/2016 CLINICAL DATA:  Right upper quadrant abdominal pain. EXAM: US ABDOMEN LIMITED - RIGHT UPPER QUADRANT COMPARISON:  CT abdomen/ pelvis earlier this day. FINDINGS: Gallbladder: Gallbladder is distended and contains sludge and small stones. Borderline gallbladder wall thickness of 3 mm. No sonographic Murphy sign noted by sonographer. No pericholecystic fluid. Common bile duct: Diameter: 12 mm at the porta hepatis. Distal common bile duct is not well visualized. Liver: No focal lesion identified. Diffusely increased in parenchymal echogenicity. Intrahepatic biliary ductal dilatation, better appreciated on CT. Normal directional flow in the main portal vein. IMPRESSION: 1. Stones and sludge within the gallbladder with borderline gallbladder wall thickening. Negative sonographic Murphy sign. 2. Biliary dilatation, cause not  identified sonographically. MRCP may be of value for further evaluation. 3. Hepatic steatosis. Electronically Signed   By: Jeb Levering M.D.   On: 12/07/2016 04:42    EKG: Independently reviewed.  Assessment/Plan Principal Problem:   Choledocholithiasis with obstruction Active Problems:   DM2 (diabetes mellitus, type 2) ([place])   Essential hypertension   Acute gallstone pancreatitis   AKI (acute kidney injury) ([place])    1. Bile duct obstruction - very likely to be choledocholithiasis with LFT elevations and mild gallstone pancreatitis too 1. NPO 2. IVF 3. Pain and nausea control 4. Call GI in AM re: ERCP to decompress vs MRCP first. 5. Ultimately if choledocholithiasis is confirmed, will need cholecystectomy in future. 2. DM2 - 1. Hold PO hypoglycemics 2. Sensitive scale SSI Q4H while NPO 3. AKI - mild 1. Presumably pre-renal 2. IVF 3. Holding HCTZ - lisinopril 4. HTN - holding HCTZ-Lisinopril, use short acting if BP elevates too much while in hospital 5. UA equivocal for UTI - but patient symptoms not really consistent with UTI 1. Urine culture pending 2. Holding off on ABx treatment for now.  DVT prophylaxis: SCDs Code Status: Full Family Communication: Family at bedside Disposition Plan: Home after admission Consults called: None Admission status: Admit to inpatient   Etta Quill DO [facility] Pager [phone]  If 7AM-7PM, please contact day team taking care of patient [email or web address] Password [email or web address]  12/07/2016, 5:25 AM

## 2016-12-07 NOTE — ED Triage Notes (Signed)
Pt c/o intermittent upper abdominal pain x 2 weeks.  +N/V.  Pt rates pain 9/10, sharp in nature.

## 2016-12-07 NOTE — Consult Note (Signed)
Reason for Consult: Choledocholithiasis Referring Physician: [name] is an 66 y.o. female.  HPI: Patient presented to the ED today with intermittent upper abdominal pain for 2 weeks. Nausea and vomiting for 2 weeks. 9/10 pain on admission. His pain was worse at night when lying down. Eating and applying pressure also made her symptoms worse. She had some diarrhea on 11/2416. She has a history of ventral hernia surgeries with mesh placement in the past. Because of continued symptoms and increasing pain she presented to the ED. Workup in the ED shows she is afebrile, blood pressure and heart rate are stable. Labs shows a CMP with a potassium of 3.4. Creatinine of 1.51. A lipase of 277. AST of 231, ALT 161. WBC was normal. Hemoglobin 10 hematocrit 31.7 platelets 120,000. She had 6-30 white cells per high-powered field and a urine cultures pending. CT of the abdomen and pelvis with contrast was obtained. This shows the liver was enlarged spanning 25.7 cm craniocaudal. Diffusely decreased hepatic density is no evidence of focal lesions. There was intra-and extrahepatic ductal dilatation. The common bile duct measures 17 mm at the porta hepatis. Bladder was mildly distended for gallstones. Question of soft tissue density/filling defect involving the distal common bile duct. Ultrasound was then obtained which showed the gallbladder is distended containing sludge and small stones. There is borderline gallbladder wall thickness of 3 mm. No Murphy sign. No pericholecystic fluid. Common bile duct was 12 mm at the porta hepatis. The common bile duct is not well visualized. Patient was admitted by the medicine service and seen in consultation by [facility] and Dr. Lucio Edward. Was 0 pending she had mild biliary pancreatitis along with cholelithiasis and possible choledocholithiasis. Additionally she has hepatomegaly and hepatic steatosis and mild splenomegaly. Chronic thrombocytopenia. There are  concerned she had a NASH, and is developing cirrhosis. She scheduled for ERCP tomorrow we are asked to see in consultation also.   Past Medical History:  Diagnosis Date  . Arthritis    PAIN AND OA BOTH KNEES AND SHOULDERS AND ELBOWS AND WRIST  . Diabetes mellitus    ORAL MEDICATION  . GERD (gastroesophageal reflux disease)   . Gout    NO RECENT FLARE UPS  . H/O: rheumatic fever    AS A CHILD - NO KNOWN HEART MURMUR OR HEART PROBLEMS  . Hyperlipidemia   . Hypertension   . Shortness of breath    WITH EXERTION    Past Surgical History:  Procedure Laterality Date  . ABDOMINAL HYSTERECTOMY    . CESAREAN SECTION    . COLONOSCOPY WITH PROPOFOL N/A 01/30/2013   Procedure: COLONOSCOPY WITH PROPOFOL;  Surgeon: Irene Shipper, MD;  Location: [facility];  Service: Endoscopy;  Laterality: N/A;  . ESOPHAGOGASTRODUODENOSCOPY (EGD) WITH PROPOFOL N/A 01/30/2013   Procedure: ESOPHAGOGASTRODUODENOSCOPY (EGD) WITH PROPOFOL;  Surgeon: Irene Shipper, MD;  Location: [facility];  Service: Endoscopy;  Laterality: N/A;  . PARTIAL HYSTERECTOMY    . TOTAL KNEE ARTHROPLASTY Right 04/20/2014   Procedure: RIGHT TOTAL KNEE ARTHROPLASTY CONVERTED TO RIGHT KNEE REIMPLANTATION;  Surgeon: Mcarthur Rossetti, MD;  Location: [facility];  Service: Orthopedics;  Laterality: Right;  . TOTAL KNEE ARTHROPLASTY Left 08/23/2015   Procedure: LEFT TOTAL KNEE ARTHROPLASTY;  Surgeon: Mcarthur Rossetti, MD;  Location: [facility];  Service: Orthopedics;  Laterality: Left;  Marland Kitchen VENTRAL HERNIA REPAIR     x2    Family History  Problem Relation Age of Onset  . Diabetes Mother   .  Hypertension Mother     entire family  . Stroke Father     CVA  . Hyperlipidemia Father     entire family  . Diabetes Father   . Heart disease Father   . Crohn's disease Son   . Irritable bowel syndrome Son     Social History:  reports that she has never smoked. She has never used smokeless tobacco. She reports that she does not drink alcohol or  use drugs.  Allergies:  Allergies  Allergen Reactions  . Aspirin     Regular ASA-stomach upset  . Other     SOME BANDAIDS CAUSE SKIN IRRITATION  . Penicillins Itching    Has patient had a PCN reaction causing immediate rash, facial/tongue/throat swelling, SOB or lightheadedness with hypotension: No Has patient had a PCN reaction causing severe rash involving mucus membranes or skin necrosis: No Has patient had a PCN reaction that required hospitalization No Has patient had a PCN reaction occurring within the last 10 years: No If all of the above answers are "NO", then may proceed with Cephalosporin use.     Prior to Admission medications   Medication Sig Start Date End Date Taking? Authorizing Provider  allopurinol (ZYLOPRIM) 300 MG tablet TAKE 1 TABLET (300 MG TOTAL) BY MOUTH 2 (TWO) TIMES DAILY. 10/13/16  Yes Yvonne R Lowne Chase, DO  calcium carbonate (TUMS - DOSED IN MG ELEMENTAL CALCIUM) 500 MG chewable tablet Chew 2 tablets by mouth 3 (three) times daily as needed for indigestion or heartburn.   Yes Historical Provider, MD  calcium-vitamin D (OSCAL WITH D) 500-200 MG-UNIT per tablet Take 1 tablet by mouth 2 (two) times daily.   Yes Historical Provider, MD  DEXILANT 60 MG capsule TAKE 1 CAPSULE (60 MG TOTAL) BY MOUTH EVERY MORNING 01/17/16  Yes Yvonne R Lowne Chase, DO  fenofibrate 160 MG tablet TAKE 1 TABLET (160 MG TOTAL) BY MOUTH DAILY. 10/13/16  Yes Yvonne R Lowne Chase, DO  JANUMET XR [phone] MG TB24 TAKE 1 TABLET BY MOUTH EVERY DAY 08/31/16  Yes Yvonne R Lowne Chase, DO  ONE TOUCH ULTRA TEST test strip ONETOUCH ULTRA BLUE TEST STRIPS CHECK BLOOD SUGAR DAILY. DX: E11.9 07/09/16  Yes Yvonne R Lowne Chase, DO  [facility] DELICA LANCETS FINE MISC Check glucose qd 06/24/15  Yes Yvonne R Lowne Chase, DO  rosuvastatin (CRESTOR) 20 MG tablet Take 1 tablet (20 mg total) by mouth daily. 02/13/16  Yes Yvonne R Lowne Chase, DO  valsartan-hydrochlorothiazide (DIOVAN-HCT) 80-12.5 MG tablet Take 1  tablet by mouth daily. 10/29/16  Yes Ann Held, DO    Results for orders placed or performed during the hospital encounter of 12/07/16 (from the past 48 hour(s))  Urinalysis, Routine w reflex microscopic     Status: Abnormal   Collection Time: 12/07/16 12:38 AM  Result Value Ref Range   Color, Urine AMBER (A) YELLOW    Comment: BIOCHEMICALS MAY BE AFFECTED BY COLOR   APPearance CLOUDY (A) CLEAR   Specific Gravity, Urine 1.027 1.005 - 1.030   pH 5.0 5.0 - 8.0   Glucose, UA NEGATIVE NEGATIVE mg/dL   Hgb urine dipstick NEGATIVE NEGATIVE   Bilirubin Urine SMALL (A) NEGATIVE   Ketones, ur NEGATIVE NEGATIVE mg/dL   Protein, ur NEGATIVE NEGATIVE mg/dL   Nitrite NEGATIVE NEGATIVE   Leukocytes, UA MODERATE (A) NEGATIVE   RBC / HPF 0-5 0 - 5 RBC/hpf   WBC, UA 6-30 0 - 5 WBC/hpf   Bacteria, UA RARE (A)  NONE SEEN   Squamous Epithelial / LPF 6-30 (A) NONE SEEN   Mucous PRESENT    Hyaline Casts, UA PRESENT   Lipase, blood     Status: Abnormal   Collection Time: 12/07/16 12:53 AM  Result Value Ref Range   Lipase 277 (H) 11 - 51 U/L  Comprehensive metabolic panel     Status: Abnormal   Collection Time: 12/07/16 12:53 AM  Result Value Ref Range   Sodium 140 135 - 145 mmol/L   Potassium 3.4 (L) 3.5 - 5.1 mmol/L   Chloride 107 101 - 111 mmol/L   CO2 22 22 - 32 mmol/L   Glucose, Bld 141 (H) 65 - 99 mg/dL   BUN 22 (H) 6 - 20 mg/dL   Creatinine, Ser 1.51 (H) 0.44 - 1.00 mg/dL   Calcium 9.5 8.9 - 10.3 mg/dL   Total Protein 6.7 6.5 - 8.1 g/dL   Albumin 3.8 3.5 - 5.0 g/dL   AST 231 (H) 15 - 41 U/L   ALT 161 (H) 14 - 54 U/L   Alkaline Phosphatase 89 38 - 126 U/L   Total Bilirubin 1.2 0.3 - 1.2 mg/dL   GFR calc non Af Amer 35 (L) >60 mL/min   GFR calc Af Amer 41 (L) >60 mL/min    Comment: (NOTE) The eGFR has been calculated using the CKD EPI equation. This calculation has not been validated in all clinical situations. eGFR's persistently <60 mL/min signify possible Chronic  Kidney Disease.    Anion gap 11 5 - 15  CBC     Status: Abnormal   Collection Time: 12/07/16 12:53 AM  Result Value Ref Range   WBC 10.0 4.0 - 10.5 K/uL   RBC 3.81 (L) 3.87 - 5.11 MIL/uL   Hemoglobin 10.0 (L) 12.0 - 15.0 g/dL   HCT 31.7 (L) 36.0 - 46.0 %   MCV 83.2 78.0 - 100.0 fL   MCH 26.2 26.0 - 34.0 pg   MCHC 31.5 30.0 - 36.0 g/dL   RDW 16.6 (H) 11.5 - 15.5 %   Platelets 120 (L) 150 - 400 K/uL    Comment: SPECIMEN CHECKED FOR CLOTS PLATELET COUNT CONFIRMED BY SMEAR REPEATED TO VERIFY   Differential     Status: None   Collection Time: 12/07/16 12:53 AM  Result Value Ref Range   Neutrophils Relative % 74 %   Neutro Abs 7.4 1.7 - 7.7 K/uL   Lymphocytes Relative 21 %   Lymphs Abs 2.1 0.7 - 4.0 K/uL   Monocytes Relative 5 %   Monocytes Absolute 0.5 0.1 - 1.0 K/uL   Smear Review MORPHOLOGY UNREMARKABLE   Glucose, capillary     Status: Abnormal   Collection Time: 12/07/16  6:08 AM  Result Value Ref Range   Glucose-Capillary 186 (H) 65 - 99 mg/dL  Glucose, capillary     Status: Abnormal   Collection Time: 12/07/16  7:48 AM  Result Value Ref Range   Glucose-Capillary 176 (H) 65 - 99 mg/dL  Glucose, capillary     Status: Abnormal   Collection Time: 12/07/16 11:59 AM  Result Value Ref Range   Glucose-Capillary 109 (H) 65 - 99 mg/dL  Protime-INR Once     Status: None   Collection Time: 12/07/16  1:38 PM  Result Value Ref Range   Prothrombin Time 13.6 11.4 - 15.2 seconds   INR 1.04     Ct Abdomen Pelvis W Contrast  Result Date: 12/07/2016 CLINICAL DATA:  Right upper quadrant pain elevated lipase  and elevated LFTs. EXAM: CT ABDOMEN AND PELVIS WITH CONTRAST TECHNIQUE: Multidetector CT imaging of the abdomen and pelvis was performed using the standard protocol following bolus administration of intravenous contrast. CONTRAST:  80 cc Isovue-300 IV COMPARISON:  None. FINDINGS: Lower chest: Mild dependent atelectasis at the lung bases. No pleural fluid. Hepatobiliary: The liver is  enlarged spanning 25.7 cm cranial caudal. There is diffusely decreased hepatic density. No evidence of focal hepatic lesion. There is intra and extrahepatic biliary ductal dilatation, with common bile duct measuring approximately 17 mm at the porta hepatis. Gallbladder is mildly distended. No calcified gallstone. Questionable soft tissue density/filling defect involving the distal common bile duct. Pancreas: Proximal pancreatic duct is borderline measuring 4 mm. No peripancreatic inflammation. No discrete pancreatic lesion. Spleen: Mildly enlarged with lobular contours. Maximal AP dimension 14 cm. No focal splenic abnormality. Adrenals/Urinary Tract: Normal adrenal glands. No hydronephrosis. Mild nonspecific perinephric edema about both kidneys. Tiny cortical hypodensity mid left kidney is too small to accurately characterize, likely simple cysts. Urinary bladder is decompressed. Stomach/Bowel: Fluid within the distal esophagus, small hiatal hernia. Stomach is distended with ingested contents. Colonic diverticulosis from the splenic flexure through the sigmoid without acute inflammation. Small bowel is decompressed. Vascular/Lymphatic: No significant vascular findings are present. No enlarged abdominal or pelvic lymph nodes. Reproductive: Status post hysterectomy. No adnexal masses. Other: No ascites. No free air or intra-abdominal fluid collection. Postsurgical changes of ventral abdominal wall without discrete hernia. Musculoskeletal: There are no acute or suspicious osseous abnormalities. Degenerative change in the lumbar spine with primarily facet arthropathy. IMPRESSION: 1. Intra and extrahepatic biliary dilatation with possible soft tissue density/filling defect in the distal common bile duct. Ultrasound recommended for initial evaluation, ultimately MRCP may be needed for more detailed evaluation of the biliary tree. Prominence of the proximal pancreatic duct without peripancreatic inflammation. 2.  Hepatomegaly and hepatic steatosis.  Mild splenomegaly. 3. Small hiatal hernia. Fluid in the distal esophagus can be seen with reflux or delayed transit. 4. Colonic diverticulosis without acute inflammation. Electronically Signed   By: Jeb Levering M.D.   On: 12/07/2016 03:09   US Abdomen Limited Ruq  Result Date: 12/07/2016 CLINICAL DATA:  Right upper quadrant abdominal pain. EXAM: US ABDOMEN LIMITED - RIGHT UPPER QUADRANT COMPARISON:  CT abdomen/ pelvis earlier this day. FINDINGS: Gallbladder: Gallbladder is distended and contains sludge and small stones. Borderline gallbladder wall thickness of 3 mm. No sonographic Murphy sign noted by sonographer. No pericholecystic fluid. Common bile duct: Diameter: 12 mm at the porta hepatis. Distal common bile duct is not well visualized. Liver: No focal lesion identified. Diffusely increased in parenchymal echogenicity. Intrahepatic biliary ductal dilatation, better appreciated on CT. Normal directional flow in the main portal vein. IMPRESSION: 1. Stones and sludge within the gallbladder with borderline gallbladder wall thickening. Negative sonographic Murphy sign. 2. Biliary dilatation, cause not identified sonographically. MRCP may be of value for further evaluation. 3. Hepatic steatosis. Electronically Signed   By: Jeb Levering M.D.   On: 12/07/2016 04:42    Review of Systems  Constitutional: Negative for chills, diaphoresis, fever, malaise/fatigue and weight loss.  HENT: Negative.   Eyes: Negative.        Glasses  Respiratory: Positive for shortness of breath (UE). Negative for cough, hemoptysis, sputum production and wheezing.   Cardiovascular: Positive for chest pain (occasional , ? GB pain), orthopnea and leg swelling. Negative for palpitations and PND.  Gastrointestinal: Positive for abdominal pain (Right upper quadrant left upper quadrant. Right upper quadrants more  painful than the left.), heartburn (Chronic issue previously on PPI, but did  not resolve.. She also takes Tums frequently.), nausea (Nausea and vomiting for 2 weeks usually after meals.) and vomiting. Negative for blood in stool, constipation, diarrhea and melena.  Genitourinary: Negative.   Musculoskeletal: Negative.   Skin: Negative.   Neurological: Negative.  Negative for weakness.  Endo/Heme/Allergies: Positive for polydipsia.  Psychiatric/Behavioral: Negative.    Blood pressure (!) 107/49, pulse 73, temperature 98.9 F (37.2 C), temperature source Oral, resp. rate 16, height _0  (1.676 m), weight 123.5 kg (272 lb 3.2 oz), SpO2 98 %. Physical Exam  Constitutional: She is oriented to person, place, and time. She appears well-developed and well-nourished. No distress.  HENT:  Head: Normocephalic.  Mouth/Throat: No oropharyngeal exudate.  Eyes: Right eye exhibits no discharge. Left eye exhibits no discharge. No scleral icterus.  Neck: Normal range of motion. Neck supple. No JVD present. No tracheal deviation present. No thyromegaly present.  Cardiovascular: Normal rate, regular rhythm, normal heart sounds and intact distal pulses.   No murmur heard. Respiratory: Effort normal and breath sounds normal. No respiratory distress. She has no wheezes. She exhibits no tenderness.  GI: Soft. Bowel sounds are normal. She exhibits no distension and no mass. There is tenderness (Tender in the right upper quadrant and left upper quadrants. More tender on the right than the left.). There is no rebound and no guarding.  Musculoskeletal: She exhibits no edema or tenderness.  Lymphadenopathy:    She has no cervical adenopathy.  Neurological: She is alert and oriented to person, place, and time. A cranial nerve deficit is present.  Skin: Skin is warm and dry. No rash noted. She is not diaphoretic. No erythema. No pallor.  Psychiatric: She has a normal mood and affect. Her behavior is normal. Judgment and thought content normal.    Assessment/Plan: Gallstone  pancreatitis/choledocholithiasis. Hepatomegaly/hepatic steatosis/mild splenomegaly/thrombocytopenia POSSIBLE NASH Type 2 diabetes Anemia Acute on chronic kidney disease stage II to 3. Acute abdomen was: Polyps last colonoscopy 01/2013. Body mass index is 43.9 Bilateral Knee replacements/osteoarthritis History of rheumatic fever Gout  Plan: She is scheduled for ERCP tomorrow. Will follow closely recheck labs for Wednesday a.m. If she does well after the ERCP and there is no further pancreatitis anticipate laparoscopic cholecystectomy with [facility] Wednesday.  Rachel Black 12/07/2016, 4:17 PM

## 2016-12-08 ENCOUNTER — Encounter (HOSPITAL_COMMUNITY)
Admission: EM | Disposition: A | Discharge status: Home / Self Care | Payer: Self-pay | Source: Home / Self Care | Attending: Internal Medicine

## 2016-12-08 ENCOUNTER — Inpatient Hospital Stay (HOSPITAL_COMMUNITY): Payer: BLUE CROSS/BLUE SHIELD

## 2016-12-08 ENCOUNTER — Inpatient Hospital Stay (HOSPITAL_COMMUNITY): Payer: BLUE CROSS/BLUE SHIELD | Admitting: Anesthesiology

## 2016-12-08 DIAGNOSIS — R7989 Other specified abnormal findings of blood chemistry: Secondary | ICD-10-CM

## 2016-12-08 DIAGNOSIS — K838 Other specified diseases of biliary tract: Secondary | ICD-10-CM

## 2016-12-08 DIAGNOSIS — K8020 Calculus of gallbladder without cholecystitis without obstruction: Secondary | ICD-10-CM

## 2016-12-08 DIAGNOSIS — Z794 Long term (current) use of insulin: Secondary | ICD-10-CM

## 2016-12-08 DIAGNOSIS — N058 Unspecified nephritic syndrome with other morphologic changes: Secondary | ICD-10-CM

## 2016-12-08 DIAGNOSIS — E1129 Type 2 diabetes mellitus with other diabetic kidney complication: Secondary | ICD-10-CM

## 2016-12-08 DIAGNOSIS — E1121 Type 2 diabetes mellitus with diabetic nephropathy: Secondary | ICD-10-CM

## 2016-12-08 DIAGNOSIS — E876 Hypokalemia: Secondary | ICD-10-CM

## 2016-12-08 DIAGNOSIS — R945 Abnormal results of liver function studies: Secondary | ICD-10-CM

## 2016-12-08 DIAGNOSIS — K219 Gastro-esophageal reflux disease without esophagitis: Secondary | ICD-10-CM

## 2016-12-08 DIAGNOSIS — K859 Acute pancreatitis without necrosis or infection, unspecified: Secondary | ICD-10-CM

## 2016-12-08 DIAGNOSIS — R1011 Right upper quadrant pain: Secondary | ICD-10-CM

## 2016-12-08 HISTORY — PX: ERCP: SHX5425

## 2016-12-08 LAB — COMPREHENSIVE METABOLIC PANEL
ALBUMIN: 3 g/dL — AB (ref 3.5–5.0)
ALK PHOS: 89 U/L (ref 38–126)
ALT: 214 U/L — ABNORMAL HIGH (ref 14–54)
ANION GAP: 7 (ref 5–15)
AST: 313 U/L — ABNORMAL HIGH (ref 15–41)
BILIRUBIN TOTAL: 1.9 mg/dL — AB (ref 0.3–1.2)
BUN: 18 mg/dL (ref 6–20)
CALCIUM: 8.5 mg/dL — AB (ref 8.9–10.3)
CO2: 23 mmol/L (ref 22–32)
Chloride: 110 mmol/L (ref 101–111)
Creatinine, Ser: 1.4 mg/dL — ABNORMAL HIGH (ref 0.44–1.00)
GFR calc non Af Amer: 39 mL/min — ABNORMAL LOW (ref >60)
GFR, EST AFRICAN AMERICAN: 45 mL/min — AB (ref >60)
GLUCOSE: 94 mg/dL (ref 65–99)
POTASSIUM: 3 mmol/L — AB (ref 3.5–5.1)
Sodium: 140 mmol/L (ref 135–145)
TOTAL PROTEIN: 5.8 g/dL — AB (ref 6.5–8.1)

## 2016-12-08 LAB — GLUCOSE, CAPILLARY
GLUCOSE-CAPILLARY: 100 mg/dL — AB (ref 65–99)
Glucose-Capillary: 96 mg/dL (ref 65–99)
Glucose-Capillary: 102 mg/dL — ABNORMAL HIGH (ref 65–99)
Glucose-Capillary: 102 mg/dL — ABNORMAL HIGH (ref 65–99)
Glucose-Capillary: 111 mg/dL — ABNORMAL HIGH (ref 65–99)
Glucose-Capillary: 109 mg/dL — ABNORMAL HIGH (ref 65–99)

## 2016-12-08 LAB — HIV ANTIBODY (ROUTINE TESTING W REFLEX): HIV SCREEN 4TH GENERATION: NONREACTIVE

## 2016-12-08 LAB — URINE CULTURE

## 2016-12-08 SURGERY — ERCP, WITH INTERVENTION IF INDICATED
Anesthesia: General

## 2016-12-08 MED ORDER — GLUCAGON HCL RDNA (DIAGNOSTIC) 1 MG IJ SOLR
INTRAMUSCULAR | Status: AC
  Filled 2016-12-08: qty 1

## 2016-12-08 MED ORDER — SUCCINYLCHOLINE CHLORIDE 200 MG/10ML IV SOSY
PREFILLED_SYRINGE | INTRAVENOUS | Status: DC | PRN
  Administered 2016-12-08: 120 mg via INTRAVENOUS

## 2016-12-08 MED ORDER — INDOMETHACIN 50 MG RE SUPP
RECTAL | Status: AC
  Filled 2016-12-08: qty 2

## 2016-12-08 MED ORDER — FENTANYL CITRATE (PF) 100 MCG/2ML IJ SOLN
INTRAMUSCULAR | Status: AC
  Filled 2016-12-08: qty 2

## 2016-12-08 MED ORDER — LACTATED RINGERS IV SOLN
INTRAVENOUS | Status: DC | PRN
  Administered 2016-12-08: 13:00:00 via INTRAVENOUS

## 2016-12-08 MED ORDER — ONDANSETRON HCL 4 MG/2ML IJ SOLN
INTRAMUSCULAR | Status: AC
  Filled 2016-12-08: qty 2

## 2016-12-08 MED ORDER — SUGAMMADEX SODIUM 500 MG/5ML IV SOLN
INTRAVENOUS | Status: AC
  Filled 2016-12-08: qty 5

## 2016-12-08 MED ORDER — HYDRALAZINE HCL 20 MG/ML IJ SOLN: 10.0000 mg | Freq: Three times a day (TID) | INTRAMUSCULAR | Status: DC | PRN

## 2016-12-08 MED ORDER — CIPROFLOXACIN IN D5W 400 MG/200ML IV SOLN
INTRAVENOUS | Status: AC
  Filled 2016-12-08: qty 200

## 2016-12-08 MED ORDER — LIDOCAINE 2% (20 MG/ML) 5 ML SYRINGE
INTRAMUSCULAR | Status: DC | PRN
  Administered 2016-12-08: 100 mg via INTRAVENOUS

## 2016-12-08 MED ORDER — SUGAMMADEX SODIUM 200 MG/2ML IV SOLN: INTRAVENOUS | Status: DC | PRN

## 2016-12-08 MED ORDER — PROPOFOL 10 MG/ML IV BOLUS
INTRAVENOUS | Status: DC | PRN
  Administered 2016-12-08: 200 mg via INTRAVENOUS

## 2016-12-08 MED ORDER — INDOMETHACIN 50 MG RE SUPP
RECTAL | Status: DC | PRN
  Administered 2016-12-08: 100 mg via RECTAL

## 2016-12-08 MED ORDER — LIDOCAINE 2% (20 MG/ML) 5 ML SYRINGE
INTRAMUSCULAR | Status: AC
  Filled 2016-12-08: qty 5

## 2016-12-08 MED ORDER — SUCCINYLCHOLINE CHLORIDE 200 MG/10ML IV SOSY
PREFILLED_SYRINGE | INTRAVENOUS | Status: AC
  Filled 2016-12-08: qty 10

## 2016-12-08 MED ORDER — ROCURONIUM BROMIDE 10 MG/ML (PF) SYRINGE
PREFILLED_SYRINGE | INTRAVENOUS | Status: DC | PRN
  Administered 2016-12-08: 30 mg via INTRAVENOUS

## 2016-12-08 MED ORDER — FENTANYL CITRATE (PF) 100 MCG/2ML IJ SOLN
INTRAMUSCULAR | Status: DC | PRN
  Administered 2016-12-08: 25 ug via INTRAVENOUS

## 2016-12-08 MED ORDER — IOPAMIDOL (ISOVUE-300) INJECTION 61%
INTRAVENOUS | Status: DC | PRN
  Administered 2016-12-08: 25 mL via INTRAVENOUS

## 2016-12-08 MED ORDER — CIPROFLOXACIN IN D5W 400 MG/200ML IV SOLN
400.0000 mg | Freq: Once | INTRAVENOUS | Status: AC
  Administered 2016-12-08: 400 mg via INTRAVENOUS

## 2016-12-08 MED ORDER — GLUCAGON HCL RDNA (DIAGNOSTIC) 1 MG IJ SOLR
INTRAMUSCULAR | Status: DC | PRN
  Administered 2016-12-08: 0.25 mg via INTRAVENOUS

## 2016-12-08 MED ORDER — POTASSIUM CHLORIDE 10 MEQ/100ML IV SOLN
10.0000 meq | INTRAVENOUS | Status: AC
  Administered 2016-12-08 (×4): 10 meq via INTRAVENOUS
  Filled 2016-12-08 (×4): qty 100

## 2016-12-08 MED ORDER — ONDANSETRON HCL 4 MG/2ML IJ SOLN
INTRAMUSCULAR | Status: DC | PRN
  Administered 2016-12-08: 4 mg via INTRAVENOUS

## 2016-12-08 MED ORDER — SUGAMMADEX SODIUM 500 MG/5ML IV SOLN
INTRAVENOUS | Status: DC | PRN
  Administered 2016-12-08: 400 mg via INTRAVENOUS

## 2016-12-08 MED ORDER — PROPOFOL 10 MG/ML IV BOLUS
INTRAVENOUS | Status: AC
  Filled 2016-12-08: qty 20

## 2016-12-08 NOTE — Op Note (Signed)
[facility] Patient Name: Rachel Black Procedure Date: 12/08/2016 MRN: [id number] Attending MD: Ladene Artist , MD Date of Birth: 1950-11-22 CSN: [id number] Age: 66 Admit Type: Inpatient Procedure:                ERCP Indications:              Abdominal pain of suspected biliary origin, Biliary                            dilation on CT, Bile duct stone on CT, Suspected                            bile duct stone(s), Elevated liver enzymes Providers:                Pricilla Riffle. Fuller Plan, MD, Burtis Junes, RN, Zenon Mayo,                            RN, Corliss Parish, Technician Referring MD:             [facility] Medicines:                General Anesthesia Complications:            No immediate complications. Estimated Blood Loss:     Estimated blood loss: none. Procedure:                Pre-Anesthesia Assessment:                           - Prior to the procedure, a History and Physical                            was performed, and patient medications and                            allergies were reviewed. The patient's tolerance of                            previous anesthesia was also reviewed. The risks                            and benefits of the procedure and the sedation                            options and risks were discussed with the patient.                            All questions were answered, and informed consent                            was obtained. Prior Anticoagulants: The patient has                            taken no previous anticoagulant or antiplatelet  agents. ASA Grade Assessment: III - A patient with                            severe systemic disease. After reviewing the risks                            and benefits, the patient was deemed in                            satisfactory condition to undergo the procedure.                           After obtaining informed consent, the scope was                             passed under direct vision. Throughout the                            procedure, the patient's blood pressure, pulse, and                            oxygen saturations were monitored continuously. The                            [id number] ([id number]) scope was introduced through                            the mouth, and used to inject contrast into and                            used to cannulate the bile duct. The ERCP was                            accomplished without difficulty. The patient                            tolerated the procedure well. Scope In: Scope Out: Findings:      The scout film was normal. The esophagus was successfully intubated       under direct vision. The scope was advanced to a normal major papilla in       the descending duodenum without detailed examination of the pharynx,       larynx and associated structures, and upper GI tract. The upper GI tract       was grossly normal except for multiple erythematous gastric polyps, 5-10       mm. The bile duct was deeply cannulated with the short-nosed traction       sphincterotome and then a guidewire. Contrast was injected which       initially filled both the CBD and PD. The CBD cannula was inserted more       deeply with selective filling of the biliary tree. The PD was not       completely filled by intention however the filled portion of the PD       appeared normal. I personally interpreted the bile duct  and pancreatic       duct images. There was appropriate flow of contrast through the ducts.       The common bile duct contained one stone, which was 7 mm in diameter.       The common bile duct and intrahepatic ducts were diffusely dilated, with       a stone causing an obstruction. The largest CBD diameter was 12 mm. The       gallbladder was partially filled. A straight Roadrunner wire was passed       into the biliary tree. An 8 mm biliary sphincterotomy was made with a       traction (standard)  sphincterotome. There was no post-sphincterotomy       bleeding. The biliary tree was swept with a 12 mm balloon starting at       the bifurcation 3 times. One stone was removed. No stones remained.       Excellent biliary drainage following the stone extraction. PD drained       promptly. Impression:               - The common bile duct was dilated, with a stone                            causing an obstruction.                           - Choledocholithiasis was found. Complete removal                            was accomplished by biliary sphincterotomy and                            balloon extraction.                           - A biliary sphincterotomy was performed.                           - The biliary tree was swept.                           - Partial pancreatogram appeared normal.                           - Multiple gastric polyps. Moderate Sedation:      N/A- Per Anesthesia Care Recommendation:           - Return to hospital ward for ongoing care.                           - Avoid aspirin and nonsteroidal anti-inflammatory                            medicines for 2 weeks.                           - Clear liquid diet for 2 hours, then advance as  tolerated to soft diet today.                           - Return to GI office with Dr. Henrene Pastor in 1 month,                            will need EGD to further evaluate gastric polyps                            and further hepatic evaluation. Procedure Code(s):        --- Professional ---                           [phone], Endoscopic retrograde                            cholangiopancreatography (ERCP); with removal of                            calculi/debris from biliary/pancreatic duct(s)                           43262, Endoscopic retrograde                            cholangiopancreatography (ERCP); with                            sphincterotomy/papillotomy Diagnosis Code(s):        --- Professional  ---                           K80.51, Calculus of bile duct without cholangitis                            or cholecystitis with obstruction                           R10.9, Unspecified abdominal pain                           R74.8, Abnormal levels of other serum enzymes                           K83.8, Other specified diseases of biliary tract CPT copyright 2016 [employer]. All rights reserved. The codes documented in this report are preliminary and upon coder review may  be revised to meet current compliance requirements. Ladene Artist, MD 12/08/2016 1:56:58 PM This report has been signed electronically. Number of Addenda: 0

## 2016-12-08 NOTE — Anesthesia Procedure Notes (Signed)
Procedure Name: Intubation Date/Time: 12/08/2016 1:12 PM Performed by: Maxwell Caul Pre-anesthesia Checklist: Patient identified, Emergency Drugs available, Suction available, Patient being monitored and Timeout performed Patient Re-evaluated:Patient Re-evaluated prior to inductionOxygen Delivery Method: Circle system utilized Preoxygenation: Pre-oxygenation with 100% oxygen Intubation Type: IV induction Ventilation: Mask ventilation without difficulty Laryngoscope Size: Mac and 3 Grade View: Grade I Tube type: Oral Tube size: 7.0 mm Number of attempts: 1 Airway Equipment and Method: Stylet Placement Confirmation: ETT inserted through vocal cords under direct vision,  positive ETCO2 and breath sounds checked- equal and bilateral Secured at: 22 cm Tube secured with: Tape Dental Injury: Teeth and Oropharynx as per pre-operative assessment

## 2016-12-08 NOTE — Transfer of Care (Signed)
Immediate Anesthesia Transfer of Care Note  Patient: [name]  Procedure(s) Performed: Procedure(s): ENDOSCOPIC RETROGRADE CHOLANGIOPANCREATOGRAPHY (ERCP) (N/A)  Patient Location: PACU  Anesthesia Type:General  Level of Consciousness: sedated  Airway & Oxygen Therapy: Patient Spontanous Breathing and Patient connected to face mask oxygen  Post-op Assessment: Report given to RN and Post -op Vital signs reviewed and stable  Post vital signs: Reviewed and stable  Last Vitals:  Vitals:   12/08/16 0940 12/08/16 1219  BP: 114/63 (!) 145/55  Pulse: 75 63  Resp: 20 (!) 26  Temp: 36.6 C     Last Pain:  Vitals:   12/08/16 0940  TempSrc: Oral  PainSc: 0-No pain      Patients Stated Pain Goal: 3 (09/64/38 3818)  Complications: No apparent anesthesia complications

## 2016-12-08 NOTE — Progress Notes (Signed)
Patient ID: Rachel Black, female   DOB: 1951/05/03, 66 y.o.   MRN: [id number]  [facility] Surgery Progress Note  Day of Surgery  Subjective: ERCP today at noon. Feeling ok. Reports some persistent RUQ and LUQ pain, but it is improving. Denies n/v.   Objective: Vital signs in last 24 hours: Temp:  [97.8 F (36.6 C)-98.9 F (37.2 C)] 97.8 F (36.6 C) (03/27 0940) Pulse Rate:  [73-87] 75 (03/27 0940) Resp:  [16-20] 20 (03/27 0940) BP: (107-137)/(49-63) 114/63 (03/27 0940) SpO2:  [96 %-98 %] 96 % (03/27 0940) Last BM Date: 12/06/16  Intake/Output from previous day: 03/26 0701 - 03/27 0700 In: 2998.8 [P.O.:30; I.V.:2968.8] Out: 1050 [Urine:1050] Intake/Output this shift: No intake/output data recorded.  PE: Gen:  Alert, NAD, pleasant Pulm:  Effort normal Abd: Soft, obese, ND, +BS, no HSM, mild RUQ TTP Ext:  No erythema, edema, or tenderness   Lab Results:   Recent Labs  12/07/16 0053  WBC 10.0  HGB 10.0*  HCT 31.7*  PLT 120*   BMET  Recent Labs  12/07/16 0053 12/08/16 0456  NA 140 140  K 3.4* 3.0*  CL 107 110  CO2 22 23  GLUCOSE 141* 94  BUN 22* 18  CREATININE 1.51* 1.40*  CALCIUM 9.5 8.5*   PT/INR  Recent Labs  12/07/16 1338  LABPROT 13.6  INR 1.04   CMP     Component Value Date/Time   NA 140 12/08/2016 0456   K 3.0 (L) 12/08/2016 0456   CL 110 12/08/2016 0456   CO2 23 12/08/2016 0456   GLUCOSE 94 12/08/2016 0456   BUN 18 12/08/2016 0456   CREATININE 1.40 (H) 12/08/2016 0456   CALCIUM 8.5 (L) 12/08/2016 0456   PROT 5.8 (L) 12/08/2016 0456   ALBUMIN 3.0 (L) 12/08/2016 0456   AST 313 (H) 12/08/2016 0456   ALT 214 (H) 12/08/2016 0456   ALKPHOS 89 12/08/2016 0456   BILITOT 1.9 (H) 12/08/2016 0456   GFRNONAA 39 (L) 12/08/2016 0456   GFRAA 45 (L) 12/08/2016 0456   Lipase     Component Value Date/Time   LIPASE 277 (H) 12/07/2016 0053       Studies/Results: Ct Abdomen Pelvis W Contrast  Result Date: 12/07/2016 CLINICAL  DATA:  Right upper quadrant pain elevated lipase and elevated LFTs. EXAM: CT ABDOMEN AND PELVIS WITH CONTRAST TECHNIQUE: Multidetector CT imaging of the abdomen and pelvis was performed using the standard protocol following bolus administration of intravenous contrast. CONTRAST:  80 cc Isovue-300 IV COMPARISON:  None. FINDINGS: Lower chest: Mild dependent atelectasis at the lung bases. No pleural fluid. Hepatobiliary: The liver is enlarged spanning 25.7 cm cranial caudal. There is diffusely decreased hepatic density. No evidence of focal hepatic lesion. There is intra and extrahepatic biliary ductal dilatation, with common bile duct measuring approximately 17 mm at the porta hepatis. Gallbladder is mildly distended. No calcified gallstone. Questionable soft tissue density/filling defect involving the distal common bile duct. Pancreas: Proximal pancreatic duct is borderline measuring 4 mm. No peripancreatic inflammation. No discrete pancreatic lesion. Spleen: Mildly enlarged with lobular contours. Maximal AP dimension 14 cm. No focal splenic abnormality. Adrenals/Urinary Tract: Normal adrenal glands. No hydronephrosis. Mild nonspecific perinephric edema about both kidneys. Tiny cortical hypodensity mid left kidney is too small to accurately characterize, likely simple cysts. Urinary bladder is decompressed. Stomach/Bowel: Fluid within the distal esophagus, small hiatal hernia. Stomach is distended with ingested contents. Colonic diverticulosis from the splenic flexure through the sigmoid without acute inflammation. Small bowel  is decompressed. Vascular/Lymphatic: No significant vascular findings are present. No enlarged abdominal or pelvic lymph nodes. Reproductive: Status post hysterectomy. No adnexal masses. Other: No ascites. No free air or intra-abdominal fluid collection. Postsurgical changes of ventral abdominal wall without discrete hernia. Musculoskeletal: There are no acute or suspicious osseous  abnormalities. Degenerative change in the lumbar spine with primarily facet arthropathy. IMPRESSION: 1. Intra and extrahepatic biliary dilatation with possible soft tissue density/filling defect in the distal common bile duct. Ultrasound recommended for initial evaluation, ultimately MRCP may be needed for more detailed evaluation of the biliary tree. Prominence of the proximal pancreatic duct without peripancreatic inflammation. 2. Hepatomegaly and hepatic steatosis.  Mild splenomegaly. 3. Small hiatal hernia. Fluid in the distal esophagus can be seen with reflux or delayed transit. 4. Colonic diverticulosis without acute inflammation. Electronically Signed   By: Jeb Levering M.D.   On: 12/07/2016 03:09   US Abdomen Limited Ruq  Result Date: 12/07/2016 CLINICAL DATA:  Right upper quadrant abdominal pain. EXAM: US ABDOMEN LIMITED - RIGHT UPPER QUADRANT COMPARISON:  CT abdomen/ pelvis earlier this day. FINDINGS: Gallbladder: Gallbladder is distended and contains sludge and small stones. Borderline gallbladder wall thickness of 3 mm. No sonographic Murphy sign noted by sonographer. No pericholecystic fluid. Common bile duct: Diameter: 12 mm at the porta hepatis. Distal common bile duct is not well visualized. Liver: No focal lesion identified. Diffusely increased in parenchymal echogenicity. Intrahepatic biliary ductal dilatation, better appreciated on CT. Normal directional flow in the main portal vein. IMPRESSION: 1. Stones and sludge within the gallbladder with borderline gallbladder wall thickening. Negative sonographic Murphy sign. 2. Biliary dilatation, cause not identified sonographically. MRCP may be of value for further evaluation. 3. Hepatic steatosis. Electronically Signed   By: Jeb Levering M.D.   On: 12/07/2016 04:42    Anti-infectives: Anti-infectives    None       Assessment/Plan Gallstone pancreatitis/choledocholithiasis. Hepatomegaly/hepatic steatosis/mild  splenomegaly/thrombocytopenia POSSIBLE NASH Type 2 diabetes Anemia Acute on chronic kidney disease stage II to 3. Acute abdomen was: Polyps last colonoscopy 01/2013. Body mass index is 43.9 Bilateral Knee replacements/osteoarthritis History of rheumatic fever Gout  Plan: ERCP today. Check CMP/CBC/lipase in AM. If labs and pain improving will plan for laparoscopic cholecystectomy with IOC tomorrow. NPO after midnight.   LOS: 1 day    Jerrye Beavers , [facility] Surgery 12/08/2016, 10:02 AM Pager: [phone] Consults: [phone] Mon-Fri 7:00 am-4:30 pm Sat-Sun 7:00 am-11:30 am

## 2016-12-08 NOTE — Progress Notes (Addendum)
PROGRESS NOTE    Rachel Black  GLO:[id number] DOB: 11/11/1950 DOA: 12/07/2016 PCP: Ann Held, DO   Brief Narrative:  66 y.o. female with medical history significant of DM2, obesity, HTN, gout.  Patient presents to the ED with c/o 9/10 severity, sharp quality abdominal pain.  Pain located in epigastric and RUQ.  Pain radiates to back. Onset initially 2-3 weeks ago, had been waxing and waning.  Associated nausea and vomiting.  Found to have acute gallstone pancreatitis and also cholelithiasis with choledocholithiasis. GI consulted for ERCP and CCS consulted for cholecystectomy. Patient improved with supportive management.  Assessment & Plan: 1-abd pain, nausea, vomiting: Due to gallstone pancreatitis and choledocholithiasis  -continue NPO status -continue IVF's, supportive care and PRN analgesics/antiemetics -planning ERCP later today -follow LFT's -surgery on board and if stable will plan for laparoscopic cholecystectomy on 3/28  2-Choledocholithiasis with obstruction -as mentioned above, GI consulted for ERCP and sphincterectomy  -continue supportive care  3-DM2 (diabetes mellitus, type 2) ([place]) -will continue SSI   4-Essential hypertension -continue PRN hydralazine for now -BP stable -resume home regimen once tolerating PO's  5-AKI (acute kidney injury) ([place]): given GFR will assume CKD stage 2 at baseline  -acute flare due to pre-renal azotemia and continue use of nephrotoxic agents -will continue holding Diovan-HCT -continue IVF's -follow Cr trend  6-obesity -Body mass index is 43.93 kg/m. -low calorie diet and increase exercise discussed with patient   7-hypokalemia -will replete as needed and follow electrolytes trend   8-GERD -continue PPI  DVT prophylaxis: SCD's and early ambulation  Code Status: Full Family Communication: discussed with husband at bedside  Disposition Plan: anticipate discharge back home once medically stable. Will maintain NPO  status; follow ERCP results and stability for cholecystectomy on 3/28. Continue PRN antiemetics and analgesics    Consultants:   GI  General surgery    Procedures:  -See below for x-ray reports -ERCP: planned for later today 3/27 -Laparoscopic cholecystectomy: if stable on 3/28   Antimicrobials:  -None   Subjective: Patient is afebrile, no nausea, no vomiting; still with RUQ and LUQ abdominal pain. Feeling better overall.  Objective: Vitals:   12/08/16 1354 12/08/16 1400 12/08/16 1410 12/08/16 1448  BP: 133/63 (!) 141/75 (!) 147/94 125/68  Pulse: 85 86 83 72  Resp: 13 (!) 21 17   Temp: 98 F (36.7 C)   98.7 F (37.1 C)  TempSrc: Oral   Oral  SpO2: 100% 99% 99% 98%  Weight:      Height:        Intake/Output Summary (Last 24 hours) at 12/08/16 1751 Last data filed at 12/08/16 1450  Gross per 24 hour  Intake          3163.33 ml  Output             1200 ml  Net          1963.33 ml   Filed Weights   12/07/16 0035 12/07/16 0600  Weight: 99.8 kg (220 lb) 123.5 kg (272 lb 3.2 oz)    Examination: General exam: Appears calm and comfortable; denies nausea, vomiting and endorses just mild abdominal pain on her RUQ and LUQ currently. Respiratory system: Clear to auscultation. Respiratory effort normal. Cardiovascular system: S1 & S2 heard, RRR. No JVD, murmurs, rubs, gallops or clicks. No pedal edema. Gastrointestinal system: Abdomen is nondistended, soft and Tender to deep palpation on RUQ area. Positive BS.  Central nervous system: Alert and oriented. No focal neurological deficits. Extremities:  Symmetric 5 x 5 power. Skin: No rashes, lesions or ulcers Psychiatry: Judgement and insight appear normal. Mood & affect appropriate.    Data Reviewed: I have personally reviewed following labs and imaging studies  CBC:  Recent Labs Lab 12/07/16 0053  WBC 10.0  NEUTROABS 7.4  HGB 10.0*  HCT 31.7*  MCV 83.2  PLT 440*   Basic Metabolic Panel:  Recent Labs Lab  12/07/16 0053 12/08/16 0456  NA 140 140  K 3.4* 3.0*  CL 107 110  CO2 22 23  GLUCOSE 141* 94  BUN 22* 18  CREATININE 1.51* 1.40*  CALCIUM 9.5 8.5*   GFR: Estimated Creatinine Clearance: 54.5 mL/min (A) (by C-G formula based on SCr of 1.4 mg/dL (H)).   Liver Function Tests:  Recent Labs Lab 12/07/16 0053 12/08/16 0456  AST 231* 313*  ALT 161* 214*  ALKPHOS 89 89  BILITOT 1.2 1.9*  PROT 6.7 5.8*  ALBUMIN 3.8 3.0*    Recent Labs Lab 12/07/16 0053  LIPASE 277*   Coagulation Profile:  Recent Labs Lab 12/07/16 1338  INR 1.04   CBG:  Recent Labs Lab 12/08/16 0004 12/08/16 0354 12/08/16 0804 12/08/16 1127 12/08/16 1556  GLUCAP 100* 96 102* 102* 111*    Recent Results (from the past 240 hour(s))  Culture, Urine     Status: Abnormal   Collection Time: 12/07/16 12:38 AM  Result Value Ref Range Status   Specimen Description URINE, RANDOM  Final   Special Requests NONE  Final   Culture MULTIPLE SPECIES PRESENT, SUGGEST RECOLLECTION (A)  Final   Report Status 12/08/2016 FINAL  Final      Radiology Studies: Ct Abdomen Pelvis W Contrast  Result Date: 12/07/2016 CLINICAL DATA:  Right upper quadrant pain elevated lipase and elevated LFTs. EXAM: CT ABDOMEN AND PELVIS WITH CONTRAST TECHNIQUE: Multidetector CT imaging of the abdomen and pelvis was performed using the standard protocol following bolus administration of intravenous contrast. CONTRAST:  80 cc Isovue-300 IV COMPARISON:  None. FINDINGS: Lower chest: Mild dependent atelectasis at the lung bases. No pleural fluid. Hepatobiliary: The liver is enlarged spanning 25.7 cm cranial caudal. There is diffusely decreased hepatic density. No evidence of focal hepatic lesion. There is intra and extrahepatic biliary ductal dilatation, with common bile duct measuring approximately 17 mm at the porta hepatis. Gallbladder is mildly distended. No calcified gallstone. Questionable soft tissue density/filling defect involving the  distal common bile duct. Pancreas: Proximal pancreatic duct is borderline measuring 4 mm. No peripancreatic inflammation. No discrete pancreatic lesion. Spleen: Mildly enlarged with lobular contours. Maximal AP dimension 14 cm. No focal splenic abnormality. Adrenals/Urinary Tract: Normal adrenal glands. No hydronephrosis. Mild nonspecific perinephric edema about both kidneys. Tiny cortical hypodensity mid left kidney is too small to accurately characterize, likely simple cysts. Urinary bladder is decompressed. Stomach/Bowel: Fluid within the distal esophagus, small hiatal hernia. Stomach is distended with ingested contents. Colonic diverticulosis from the splenic flexure through the sigmoid without acute inflammation. Small bowel is decompressed. Vascular/Lymphatic: No significant vascular findings are present. No enlarged abdominal or pelvic lymph nodes. Reproductive: Status post hysterectomy. No adnexal masses. Other: No ascites. No free air or intra-abdominal fluid collection. Postsurgical changes of ventral abdominal wall without discrete hernia. Musculoskeletal: There are no acute or suspicious osseous abnormalities. Degenerative change in the lumbar spine with primarily facet arthropathy. IMPRESSION: 1. Intra and extrahepatic biliary dilatation with possible soft tissue density/filling defect in the distal common bile duct. Ultrasound recommended for initial evaluation, ultimately MRCP may be needed for  more detailed evaluation of the biliary tree. Prominence of the proximal pancreatic duct without peripancreatic inflammation. 2. Hepatomegaly and hepatic steatosis.  Mild splenomegaly. 3. Small hiatal hernia. Fluid in the distal esophagus can be seen with reflux or delayed transit. 4. Colonic diverticulosis without acute inflammation. Electronically Signed   By: Jeb Levering M.D.   On: 12/07/2016 03:09   Dg Ercp Biliary & Pancreatic Ducts  Result Date: 12/08/2016 CLINICAL DATA:  Gallstones and concern  for a distal common bile duct stone. EXAM: ERCP TECHNIQUE: Multiple spot images obtained with the fluoroscopic device and submitted for interpretation post-procedure. FLUOROSCOPY TIME:  Fluoroscopy Time:  2 minutes and 15 seconds Radiation Exposure Index (if provided by the fluoroscopic device): 63.99 mGy COMPARISON:  CT 12/07/2016 FINDINGS: Cannulation and opacification of the extrahepatic biliary system. There is a filling defect in the distal common bile duct suggestive for stone. There is also a filling defect in the common hepatic duct suggestive for a stone. Cystic duct is patent with filling of the gallbladder. Balloon was used for stone removal. No large filling defects on the final images. IMPRESSION: Choledocholithiasis and stone removal. These images were submitted for radiologic interpretation only. Please see the procedural report for the amount of contrast and the fluoroscopy time utilized. Electronically Signed   By: Markus Daft M.D.   On: 12/08/2016 14:38   US Abdomen Limited Ruq  Result Date: 12/07/2016 CLINICAL DATA:  Right upper quadrant abdominal pain. EXAM: US ABDOMEN LIMITED - RIGHT UPPER QUADRANT COMPARISON:  CT abdomen/ pelvis earlier this day. FINDINGS: Gallbladder: Gallbladder is distended and contains sludge and small stones. Borderline gallbladder wall thickness of 3 mm. No sonographic Murphy sign noted by sonographer. No pericholecystic fluid. Common bile duct: Diameter: 12 mm at the porta hepatis. Distal common bile duct is not well visualized. Liver: No focal lesion identified. Diffusely increased in parenchymal echogenicity. Intrahepatic biliary ductal dilatation, better appreciated on CT. Normal directional flow in the main portal vein. IMPRESSION: 1. Stones and sludge within the gallbladder with borderline gallbladder wall thickening. Negative sonographic Murphy sign. 2. Biliary dilatation, cause not identified sonographically. MRCP may be of value for further evaluation. 3.  Hepatic steatosis. Electronically Signed   By: Jeb Levering M.D.   On: 12/07/2016 04:42    Scheduled Meds: . allopurinol  300 mg Oral BID  . calcium-vitamin D  1 tablet Oral BID  . insulin aspart  0-9 Units Subcutaneous Q4H  . mouth rinse  15 mL Mouth Rinse BID  . pantoprazole  40 mg Oral BID   Continuous Infusions: . sodium chloride 125 mL/hr at 12/08/16 1416     LOS: 1 day    Time spent: 25 minutes    Barton Dubois, MD [facility] Pager [phone]  If 7PM-7AM, please contact night-coverage [email or web address] Password [facility] 12/08/2016, 5:51 PM  .

## 2016-12-08 NOTE — Anesthesia Preprocedure Evaluation (Signed)
Anesthesia Evaluation  Patient identified by MRN, date of birth, ID band Patient awake    Reviewed: Allergy & Precautions, H&P , Patient's Chart, lab work & pertinent test results, reviewed documented beta blocker date and time   Airway Mallampati: II  TM Distance: >3 FB Neck ROM: full    Dental no notable dental hx.    Pulmonary    Pulmonary exam normal breath sounds clear to auscultation       Cardiovascular hypertension,  Rhythm:regular Rate:Normal     Neuro/Psych    GI/Hepatic GERD  ,  Endo/Other  diabetes, Type 2Morbid obesity  Renal/GU      Musculoskeletal   Abdominal   Peds  Hematology   Anesthesia Other Findings   Reproductive/Obstetrics                             Anesthesia Physical Anesthesia Plan  ASA: III  Anesthesia Plan: General   Post-op Pain Management:    Induction: Intravenous  Airway Management Planned: Oral ETT  Additional Equipment:   Intra-op Plan:   Post-operative Plan: Extubation in OR  Informed Consent: I have reviewed the patients History and Physical, chart, labs and discussed the procedure including the risks, benefits and alternatives for the proposed anesthesia with the patient or authorized representative who has indicated his/her understanding and acceptance.   Dental Advisory Given  Plan Discussed with: CRNA and Surgeon  Anesthesia Plan Comments: (  )        Anesthesia Quick Evaluation

## 2016-12-09 ENCOUNTER — Encounter (HOSPITAL_COMMUNITY)
Admission: EM | Disposition: A | Discharge status: Home / Self Care | Payer: Self-pay | Source: Home / Self Care | Attending: Internal Medicine

## 2016-12-09 ENCOUNTER — Inpatient Hospital Stay (HOSPITAL_COMMUNITY): Payer: BLUE CROSS/BLUE SHIELD | Admitting: Anesthesiology

## 2016-12-09 ENCOUNTER — Encounter (HOSPITAL_COMMUNITY): Payer: Self-pay | Admitting: Gastroenterology

## 2016-12-09 ENCOUNTER — Inpatient Hospital Stay (HOSPITAL_COMMUNITY): Payer: BLUE CROSS/BLUE SHIELD

## 2016-12-09 DIAGNOSIS — N179 Acute kidney failure, unspecified: Secondary | ICD-10-CM

## 2016-12-09 HISTORY — PX: CHOLECYSTECTOMY: SHX55

## 2016-12-09 LAB — GLUCOSE, CAPILLARY
GLUCOSE-CAPILLARY: 76 mg/dL (ref 65–99)
GLUCOSE-CAPILLARY: 92 mg/dL (ref 65–99)
GLUCOSE-CAPILLARY: 95 mg/dL (ref 65–99)
Glucose-Capillary: 100 mg/dL — ABNORMAL HIGH (ref 65–99)
Glucose-Capillary: 91 mg/dL (ref 65–99)

## 2016-12-09 LAB — CBC
HEMATOCRIT: 28.7 % — AB (ref 36.0–46.0)
HEMOGLOBIN: 8.9 g/dL — AB (ref 12.0–15.0)
MCH: 26.1 pg (ref 26.0–34.0)
MCHC: 31 g/dL (ref 30.0–36.0)
MCV: 84.2 fL (ref 78.0–100.0)
Platelets: 107 10*3/uL — ABNORMAL LOW (ref 150–400)
RBC: 3.41 MIL/uL — ABNORMAL LOW (ref 3.87–5.11)
RDW: 16.8 % — AB (ref 11.5–15.5)
WBC: 4.8 10*3/uL (ref 4.0–10.5)

## 2016-12-09 LAB — COMPREHENSIVE METABOLIC PANEL
ALBUMIN: 3.3 g/dL — AB (ref 3.5–5.0)
ALK PHOS: 107 U/L (ref 38–126)
ALT: 175 U/L — ABNORMAL HIGH (ref 14–54)
AST: 189 U/L — ABNORMAL HIGH (ref 15–41)
Anion gap: 10 (ref 5–15)
BUN: 15 mg/dL (ref 6–20)
CHLORIDE: 111 mmol/L (ref 101–111)
CO2: 22 mmol/L (ref 22–32)
CREATININE: 1.19 mg/dL — AB (ref 0.44–1.00)
Calcium: 9 mg/dL (ref 8.9–10.3)
GFR calc Af Amer: 55 mL/min — ABNORMAL LOW (ref >60)
GFR calc non Af Amer: 47 mL/min — ABNORMAL LOW (ref >60)
Glucose, Bld: 85 mg/dL (ref 65–99)
POTASSIUM: 3.6 mmol/L (ref 3.5–5.1)
Sodium: 143 mmol/L (ref 135–145)
Total Bilirubin: 1.1 mg/dL (ref 0.3–1.2)
Total Protein: 6.4 g/dL — ABNORMAL LOW (ref 6.5–8.1)

## 2016-12-09 LAB — LIPASE, BLOOD: LIPASE: 34 U/L (ref 11–51)

## 2016-12-09 LAB — SURGICAL PCR SCREEN
MRSA, PCR: NEGATIVE
Staphylococcus aureus: NEGATIVE

## 2016-12-09 SURGERY — LAPAROSCOPIC CHOLECYSTECTOMY WITH INTRAOPERATIVE CHOLANGIOGRAM
Anesthesia: General | Site: Abdomen

## 2016-12-09 MED ORDER — IOPAMIDOL (ISOVUE-300) INJECTION 61%
INTRAVENOUS | Status: DC | PRN
Start: 2016-12-09 — End: 2016-12-09
  Administered 2016-12-09: 5 mL

## 2016-12-09 MED ORDER — BUPIVACAINE HCL (PF) 0.25 % IJ SOLN
INTRAMUSCULAR | Status: DC | PRN
  Administered 2016-12-09: 10 mL

## 2016-12-09 MED ORDER — FENTANYL CITRATE (PF) 100 MCG/2ML IJ SOLN
INTRAMUSCULAR | Status: AC
  Filled 2016-12-09: qty 2

## 2016-12-09 MED ORDER — SUGAMMADEX SODIUM 200 MG/2ML IV SOLN
INTRAVENOUS | Status: DC | PRN
  Administered 2016-12-09: 200 mg via INTRAVENOUS

## 2016-12-09 MED ORDER — ONDANSETRON HCL 4 MG/2ML IJ SOLN
INTRAMUSCULAR | Status: DC | PRN
  Administered 2016-12-09: 4 mg via INTRAVENOUS

## 2016-12-09 MED ORDER — CIPROFLOXACIN IN D5W 400 MG/200ML IV SOLN
INTRAVENOUS | Status: DC | PRN
  Administered 2016-12-09: 400 mg via INTRAVENOUS

## 2016-12-09 MED ORDER — DEXAMETHASONE SODIUM PHOSPHATE 10 MG/ML IJ SOLN
INTRAMUSCULAR | Status: AC
  Filled 2016-12-09: qty 1

## 2016-12-09 MED ORDER — OXYCODONE-ACETAMINOPHEN 5-325 MG PO TABS
1.0000 | ORAL_TABLET | ORAL | Status: DC | PRN
  Administered 2016-12-09 – 2016-12-10 (×2): 1 via ORAL
  Filled 2016-12-09 (×2): qty 1

## 2016-12-09 MED ORDER — OXYCODONE HCL 5 MG PO TABS: 5.0000 mg | ORAL_TABLET | Freq: Once | ORAL | Status: DC | PRN

## 2016-12-09 MED ORDER — PROMETHAZINE HCL 25 MG/ML IJ SOLN: 6.2500 mg | INTRAMUSCULAR | Status: DC | PRN

## 2016-12-09 MED ORDER — 0.9 % SODIUM CHLORIDE (POUR BTL) OPTIME
TOPICAL | Status: DC | PRN
  Administered 2016-12-09: 1000 mL

## 2016-12-09 MED ORDER — ROCURONIUM BROMIDE 10 MG/ML (PF) SYRINGE
PREFILLED_SYRINGE | INTRAVENOUS | Status: DC | PRN
  Administered 2016-12-09: 10 mg via INTRAVENOUS
  Administered 2016-12-09: 40 mg via INTRAVENOUS

## 2016-12-09 MED ORDER — HYDROMORPHONE HCL 1 MG/ML IJ SOLN
INTRAMUSCULAR | Status: AC
  Filled 2016-12-09: qty 1

## 2016-12-09 MED ORDER — ROCURONIUM BROMIDE 50 MG/5ML IV SOSY
PREFILLED_SYRINGE | INTRAVENOUS | Status: AC
  Filled 2016-12-09: qty 5

## 2016-12-09 MED ORDER — HYDROMORPHONE HCL 1 MG/ML IJ SOLN
0.2500 mg | INTRAMUSCULAR | Status: DC | PRN
  Administered 2016-12-09 (×3): 0.5 mg via INTRAVENOUS

## 2016-12-09 MED ORDER — PROPOFOL 10 MG/ML IV BOLUS
INTRAVENOUS | Status: AC
  Filled 2016-12-09: qty 20

## 2016-12-09 MED ORDER — FENTANYL CITRATE (PF) 100 MCG/2ML IJ SOLN
INTRAMUSCULAR | Status: DC | PRN
  Administered 2016-12-09 (×5): 50 ug via INTRAVENOUS
  Administered 2016-12-09: 100 ug via INTRAVENOUS

## 2016-12-09 MED ORDER — OXYCODONE HCL 5 MG/5ML PO SOLN
5.0000 mg | Freq: Once | ORAL | Status: DC | PRN
  Filled 2016-12-09: qty 5

## 2016-12-09 MED ORDER — PROPOFOL 10 MG/ML IV BOLUS
INTRAVENOUS | Status: DC | PRN
  Administered 2016-12-09: 200 mg via INTRAVENOUS
  Administered 2016-12-09: 75 mg via INTRAVENOUS

## 2016-12-09 MED ORDER — MORPHINE SULFATE (PF) 4 MG/ML IV SOLN: 1.0000 mg | INTRAVENOUS | Status: DC | PRN

## 2016-12-09 MED ORDER — IOPAMIDOL (ISOVUE-300) INJECTION 61%
INTRAVENOUS | Status: AC
  Filled 2016-12-09: qty 50

## 2016-12-09 MED ORDER — LIDOCAINE 2% (20 MG/ML) 5 ML SYRINGE
INTRAMUSCULAR | Status: DC | PRN
  Administered 2016-12-09: 100 mg via INTRAVENOUS

## 2016-12-09 MED ORDER — LACTATED RINGERS IR SOLN
Status: DC | PRN
Start: 2016-12-09 — End: 2016-12-09
  Administered 2016-12-09: 1000 mL

## 2016-12-09 MED ORDER — LIDOCAINE-EPINEPHRINE (PF) 1 %-1:200000 IJ SOLN
INTRAMUSCULAR | Status: DC | PRN
  Administered 2016-12-09: 10 mL

## 2016-12-09 MED ORDER — FENTANYL CITRATE (PF) 250 MCG/5ML IJ SOLN
INTRAMUSCULAR | Status: AC
  Filled 2016-12-09: qty 5

## 2016-12-09 MED ORDER — BUPIVACAINE HCL (PF) 0.25 % IJ SOLN
INTRAMUSCULAR | Status: AC
  Filled 2016-12-09: qty 30

## 2016-12-09 MED ORDER — LIDOCAINE-EPINEPHRINE (PF) 1 %-1:200000 IJ SOLN
INTRAMUSCULAR | Status: AC
  Filled 2016-12-09: qty 30

## 2016-12-09 MED ORDER — SUCCINYLCHOLINE CHLORIDE 200 MG/10ML IV SOSY
PREFILLED_SYRINGE | INTRAVENOUS | Status: AC
  Filled 2016-12-09: qty 10

## 2016-12-09 MED ORDER — ONDANSETRON HCL 4 MG/2ML IJ SOLN
INTRAMUSCULAR | Status: AC
  Filled 2016-12-09: qty 2

## 2016-12-09 MED ORDER — LIDOCAINE 2% (20 MG/ML) 5 ML SYRINGE
INTRAMUSCULAR | Status: AC
  Filled 2016-12-09: qty 5

## 2016-12-09 MED ORDER — SUCCINYLCHOLINE CHLORIDE 200 MG/10ML IV SOSY
PREFILLED_SYRINGE | INTRAVENOUS | Status: DC | PRN
  Administered 2016-12-09: 120 mg via INTRAVENOUS

## 2016-12-09 MED ORDER — DEXAMETHASONE SODIUM PHOSPHATE 10 MG/ML IJ SOLN
INTRAMUSCULAR | Status: DC | PRN
  Administered 2016-12-09: 10 mg via INTRAVENOUS

## 2016-12-09 MED ORDER — LACTATED RINGERS IV SOLN
INTRAVENOUS | Status: DC
  Administered 2016-12-09 (×2): via INTRAVENOUS

## 2016-12-09 MED ORDER — CIPROFLOXACIN IN D5W 400 MG/200ML IV SOLN
INTRAVENOUS | Status: AC
  Filled 2016-12-09: qty 200

## 2016-12-09 SURGICAL SUPPLY — 48 items
APPLIER CLIP ROT 10 11.4 M/L (STAPLE) ×2
CHLORAPREP W/TINT 26ML (MISCELLANEOUS) ×2 IMPLANT
CLIP APPLIE ROT 10 11.4 M/L (STAPLE) ×1 IMPLANT
CLIP LIGATING HEMO O LOK GREEN (MISCELLANEOUS) IMPLANT
COVER MAYO STAND STRL (DRAPES) IMPLANT
COVER SURGICAL LIGHT HANDLE (MISCELLANEOUS) ×2 IMPLANT
DECANTER SPIKE VIAL GLASS SM (MISCELLANEOUS) ×2 IMPLANT
DERMABOND ADVANCED (GAUZE/BANDAGES/DRESSINGS) ×1
DERMABOND ADVANCED .7 DNX12 (GAUZE/BANDAGES/DRESSINGS) ×1 IMPLANT
DEVICE TROCAR PUNCTURE CLOSURE (ENDOMECHANICALS) ×2 IMPLANT
DRAPE C-ARM 42X120 X-RAY (DRAPES) IMPLANT
ELECT L-HOOK LAP 45CM DISP (ELECTROSURGICAL)
ELECT PENCIL ROCKER SW 15FT (MISCELLANEOUS) ×2 IMPLANT
ELECT REM PT RETURN 15FT ADLT (MISCELLANEOUS) ×2 IMPLANT
ELECTRODE L-HOOK LAP 45CM DISP (ELECTROSURGICAL) IMPLANT
ENDOLOOP SUT PDS II  0 18 (SUTURE) ×1
ENDOLOOP SUT PDS II 0 18 (SUTURE) ×1 IMPLANT
GLOVE BIO SURGEON STRL SZ 6 (GLOVE) ×2 IMPLANT
GLOVE BIO SURGEON STRL SZ 6.5 (GLOVE) ×2 IMPLANT
GLOVE BIOGEL PI IND STRL 7.0 (GLOVE) ×1 IMPLANT
GLOVE BIOGEL PI IND STRL 7.5 (GLOVE) ×1 IMPLANT
GLOVE BIOGEL PI INDICATOR 7.0 (GLOVE) ×1
GLOVE BIOGEL PI INDICATOR 7.5 (GLOVE) ×1
GLOVE INDICATOR 6.5 STRL GRN (GLOVE) ×2 IMPLANT
GLOVE SURG SS PI 7.5 STRL IVOR (GLOVE) ×2 IMPLANT
GOWN STRL REUS W/TWL 2XL LVL3 (GOWN DISPOSABLE) ×2 IMPLANT
GOWN STRL REUS W/TWL XL LVL3 (GOWN DISPOSABLE) ×4 IMPLANT
HEMOSTAT SNOW SURGICEL 2X4 (HEMOSTASIS) ×2 IMPLANT
IRRIG SUCT STRYKERFLOW 2 WTIP (MISCELLANEOUS) ×2
IRRIGATION SUCT STRKRFLW 2 WTP (MISCELLANEOUS) ×1 IMPLANT
KIT BASIN OR (CUSTOM PROCEDURE TRAY) ×2 IMPLANT
L-HOOK LAP DISP 36CM (ELECTROSURGICAL)
LHOOK LAP DISP 36CM (ELECTROSURGICAL) IMPLANT
POSITIONER SURGICAL ARM (MISCELLANEOUS) IMPLANT
POUCH SPECIMEN RETRIEVAL 10MM (ENDOMECHANICALS) ×2 IMPLANT
SCISSORS LAP 5X35 DISP (ENDOMECHANICALS) ×2 IMPLANT
SET CHOLANGIOGRAPH MIX (MISCELLANEOUS) IMPLANT
SLEEVE XCEL OPT CAN 5 100 (ENDOMECHANICALS) ×2 IMPLANT
SUT MNCRL AB 4-0 PS2 18 (SUTURE) ×4 IMPLANT
SUT VICRYL 0 UR6 27IN ABS (SUTURE) ×4 IMPLANT
TAPE CLOTH 4X10 WHT NS (GAUZE/BANDAGES/DRESSINGS) IMPLANT
TOWEL OR 17X26 10 PK STRL BLUE (TOWEL DISPOSABLE) ×2 IMPLANT
TOWEL OR NON WOVEN STRL DISP B (DISPOSABLE) ×2 IMPLANT
TRAY LAPAROSCOPIC (CUSTOM PROCEDURE TRAY) ×2 IMPLANT
TROCAR BLADELESS OPT 5 100 (ENDOMECHANICALS) ×2 IMPLANT
TROCAR XCEL BLUNT TIP 100MML (ENDOMECHANICALS) ×2 IMPLANT
TROCAR XCEL NON-BLD 11X100MML (ENDOMECHANICALS) ×2 IMPLANT
TUBING INSUF HEATED (TUBING) ×2 IMPLANT

## 2016-12-09 NOTE — Anesthesia Preprocedure Evaluation (Signed)
Anesthesia Evaluation  Patient identified by MRN, date of birth, ID band Patient awake    Reviewed: Allergy & Precautions, NPO status , Patient's Chart, lab work & pertinent test results  Airway Mallampati: II  TM Distance: <3 FB Neck ROM: Full    Dental no notable dental hx.    Pulmonary neg pulmonary ROS,    breath sounds clear to auscultation + decreased breath sounds      Cardiovascular hypertension, Normal cardiovascular exam Rhythm:Regular Rate:Normal     Neuro/Psych negative neurological ROS  negative psych ROS   GI/Hepatic negative GI ROS, Neg liver ROS,   Endo/Other  diabetesMorbid obesity  Renal/GU negative Renal ROS  negative genitourinary   Musculoskeletal negative musculoskeletal ROS (+)   Abdominal   Peds negative pediatric ROS (+)  Hematology  (+) anemia ,   Anesthesia Other Findings   Reproductive/Obstetrics negative OB ROS                             Anesthesia Physical Anesthesia Plan  ASA: III  Anesthesia Plan: General   Post-op Pain Management:    Induction: Intravenous  Airway Management Planned: Oral ETT  Additional Equipment:   Intra-op Plan:   Post-operative Plan: Extubation in OR  Informed Consent: I have reviewed the patients History and Physical, chart, labs and discussed the procedure including the risks, benefits and alternatives for the proposed anesthesia with the patient or authorized representative who has indicated his/her understanding and acceptance.   Dental advisory given  Plan Discussed with: CRNA and Surgeon  Anesthesia Plan Comments:         Anesthesia Quick Evaluation

## 2016-12-09 NOTE — Progress Notes (Signed)
Patient has constantly been exhibiting periods of disorientation, thinking that she is still in the pre-op area rather than PACU. Orients fairly easily but then reverts to thinking she is in pre-op.

## 2016-12-09 NOTE — Progress Notes (Signed)
[facility] Progress Note  Patient: Rachel Black WYO:[id number]   PCP: Ann Held, DO DOB: 1951-04-23   DOA: 12/07/2016   DOS: 12/09/2016   Date of Service: the patient was seen and examined on 12/09/2016  Subjective: feeling better, pain well controlled   Brief hospital course: Pt. with PMH of  DM2, obesity, HTN, gout; admitted on 12/07/2016, with complaint of abdominal pain, was found to have acute gallstone pancreatitis. Underwent ERCP and now scheduled for lap chole today. Currently further plan is follow post op course.  Assessment and Plan: 1. Choledocholithiasis with obstruction    Acute gallstone pancreatitis Continue IV fluids, S/P ERCP with CBD stone removal. Does not have any complication from ERCP. Scheduled for left cholecystectomy today. Monitor postoperative course. GI has signed off, follow up with Dr. Henrene Pastor.  2. Type 2 diabetes mellitus. Continue on sliding scale insulin.  3. Essential hypertension. The patient is stable. Will resume home medication once tolerating by mouth.  4. Acute kidney injury. CKD stage II. Currently renal function stable and improving. We will monitor.  5-obesity -Body mass index is 43.93 kg/m. -low calorie diet and increase exercise discussed with patient   6-hypokalemia -will replete as needed and follow electrolytes trend   7-GERD -continue PPI  Bowel regimen: last BM 12/09/2016 Diet: NPO DVT Prophylaxis: subcutaneous Heparin  Advance goals of care discussion: full code  Family Communication: family was present at bedside, at the time of interview. The pt provided permission to discuss medical plan with the family. Opportunity was given to ask question and all questions were answered satisfactorily.   Disposition:  Discharge to home. Expected discharge date: 12/10/2016, diet tolerance  Consultants: general surgery, gastroenterology Procedures: ERCP  Antibiotics: Anti-infectives    Start      Dose/Rate Route Frequency Ordered Stop   12/08/16 1330  ciprofloxacin (CIPRO) IVPB 400 mg    Comments:  Hang when taken down to Endo lab for ERCP   400 mg 200 mL/hr over 60 Minutes Intravenous  Once 12/08/16 1257 12/08/16 1320       Objective: Physical Exam: Vitals:   12/08/16 1410 12/08/16 1448 12/09/16 0429 12/09/16 1100  BP: (!) 147/94 125/68 100/61 (!) 129/37  Pulse: 83 72 60 (!) 55  Resp: 17  16 16   Temp:  98.7 F (37.1 C) 98.1 F (36.7 C) 98.4 F (36.9 C)  TempSrc:  Oral Oral Oral  SpO2: 99% 98% 98% 100%  Weight:      Height:        Intake/Output Summary (Last 24 hours) at 12/09/16 1354 Last data filed at 12/09/16 1000  Gross per 24 hour  Intake             3100 ml  Output              425 ml  Net             2675 ml   Filed Weights   12/07/16 0035 12/07/16 0600  Weight: 99.8 kg (220 lb) 123.5 kg (272 lb 3.2 oz)   General: Alert, Awake and Oriented to Time, Place and Person. Appear in mild distress, affect appropriate Eyes: PERRL, Conjunctiva normal ENT: Oral Mucosa clear moist. Neck: no JVD, no Abnormal Mass Or lumps Cardiovascular: S1 and S2 Present, no Murmur, Respiratory: Bilateral Air entry equal and Decreased, no use of accessory muscle, Clear to Auscultation, no Crackles, no wheezes Abdomen: Bowel Sound present, Soft and no tenderness Skin: no redness, no Rash, no induration  Extremities: no Pedal edema, no calf tenderness Neurologic: Grossly no focal neuro deficit. Bilaterally Equal motor strength  Data Reviewed: CBC:  Recent Labs Lab 12/07/16 0053 12/09/16 0506  WBC 10.0 4.8  NEUTROABS 7.4  --   HGB 10.0* 8.9*  HCT 31.7* 28.7*  MCV 83.2 84.2  PLT 120* 660*   Basic Metabolic Panel:  Recent Labs Lab 12/07/16 0053 12/08/16 0456 12/09/16 0506  NA 140 140 143  K 3.4* 3.0* 3.6  CL 107 110 111  CO2 22 23 22   GLUCOSE 141* 94 85  BUN 22* 18 15  CREATININE 1.51* 1.40* 1.19*  CALCIUM 9.5 8.5* 9.0    Liver Function Tests:  Recent  Labs Lab 12/07/16 0053 12/08/16 0456 12/09/16 0506  AST 231* 313* 189*  ALT 161* 214* 175*  ALKPHOS 89 89 107  BILITOT 1.2 1.9* 1.1  PROT 6.7 5.8* 6.4*  ALBUMIN 3.8 3.0* 3.3*    Recent Labs Lab 12/07/16 0053 12/09/16 0506  LIPASE 277* 34   No results for input(s): AMMONIA in the last 168 hours. Coagulation Profile:  Recent Labs Lab 12/07/16 1338  INR 1.04   Cardiac Enzymes: No results for input(s): CKTOTAL, CKMB, CKMBINDEX, TROPONINI in the last 168 hours. BNP (last 3 results) No results for input(s): PROBNP in the last 8760 hours. CBG:  Recent Labs Lab 12/08/16 2016 12/09/16 0009 12/09/16 0424 12/09/16 0735 12/09/16 1158  GLUCAP 109* 91 76 92 95   Studies: Dg Ercp Biliary & Pancreatic Ducts  Result Date: 12/08/2016 CLINICAL DATA:  Gallstones and concern for a distal common bile duct stone. EXAM: ERCP TECHNIQUE: Multiple spot images obtained with the fluoroscopic device and submitted for interpretation post-procedure. FLUOROSCOPY TIME:  Fluoroscopy Time:  2 minutes and 15 seconds Radiation Exposure Index (if provided by the fluoroscopic device): 63.99 mGy COMPARISON:  CT 12/07/2016 FINDINGS: Cannulation and opacification of the extrahepatic biliary system. There is a filling defect in the distal common bile duct suggestive for stone. There is also a filling defect in the common hepatic duct suggestive for a stone. Cystic duct is patent with filling of the gallbladder. Balloon was used for stone removal. No large filling defects on the final images. IMPRESSION: Choledocholithiasis and stone removal. These images were submitted for radiologic interpretation only. Please see the procedural report for the amount of contrast and the fluoroscopy time utilized. Electronically Signed   By: Markus Daft M.D.   On: 12/08/2016 14:38    Scheduled Meds: . [MAR Hold] allopurinol  300 mg Oral BID  . [MAR Hold] calcium-vitamin D  1 tablet Oral BID  . [MAR Hold] insulin aspart  0-9  Units Subcutaneous Q4H  . [MAR Hold] mouth rinse  15 mL Mouth Rinse BID  . [MAR Hold] pantoprazole  40 mg Oral BID   Continuous Infusions: . sodium chloride 1,000 mL (12/09/16 0643)  . lactated ringers 75 mL/hr at 12/09/16 1329   PRN Meds: [MAR Hold] calcium carbonate, [MAR Hold] hydrALAZINE, [MAR Hold]  morphine injection, [MAR Hold] ondansetron (ZOFRAN) IV  Time spent: 30 minutes  Author: Berle Mull, MD [facility] Pager: [phone] 12/09/2016 1:54 PM  If 7PM-7AM, please contact night-coverage at [email or web address], password [facility]

## 2016-12-09 NOTE — Progress Notes (Signed)
Progress Note   Subjective  abd pain has improved.   Objective  Vital signs in last 24 hours: Temp:  [97.8 F (36.6 C)-98.7 F (37.1 C)] 98.1 F (36.7 C) (03/28 0429) Pulse Rate:  [60-86] 60 (03/28 0429) Resp:  [13-26] 16 (03/28 0429) BP: (100-147)/(55-94) 100/61 (03/28 0429) SpO2:  [96 %-100 %] 98 % (03/28 0429) Last BM Date: 12/08/16  General: Alert, well-developed, in NAD Heart:  Regular rate and rhythm; no murmurs Chest: Clear to ascultation bilaterally Abdomen:  Soft, mild upper abdominal tenderness and nondistended. Normal bowel sounds, without guarding, and without rebound.   Extremities:  Without edema. Neurologic:  Alert and  oriented x4; grossly normal neurologically. Psych:  Alert and cooperative. Normal mood and affect.  Intake/Output from previous day: 03/27 0701 - 03/28 0700 In: 3300 [I.V.:3000; IV Piggyback:300] Out: 550 [Urine:550] Intake/Output this shift: No intake/output data recorded.  Lab Results:  Recent Labs  12/07/16 0053 12/09/16 0506  WBC 10.0 4.8  HGB 10.0* 8.9*  HCT 31.7* 28.7*  PLT 120* 107*   BMET  Recent Labs  12/07/16 0053 12/08/16 0456 12/09/16 0506  NA 140 140 143  K 3.4* 3.0* 3.6  CL 107 110 111  CO2 22 23 22   GLUCOSE 141* 94 85  BUN 22* 18 15  CREATININE 1.51* 1.40* 1.19*  CALCIUM 9.5 8.5* 9.0   LFT  Recent Labs  12/09/16 0506  PROT 6.4*  ALBUMIN 3.3*  AST 189*  ALT 175*  ALKPHOS 107  BILITOT 1.1   PT/INR  Recent Labs  12/07/16 1338  LABPROT 13.6  INR 1.04   Lipase 34  Studies/Results: Dg Ercp Biliary & Pancreatic Ducts  Result Date: 12/08/2016 CLINICAL DATA:  Gallstones and concern for a distal common bile duct stone. EXAM: ERCP TECHNIQUE: Multiple spot images obtained with the fluoroscopic device and submitted for interpretation post-procedure. FLUOROSCOPY TIME:  Fluoroscopy Time:  2 minutes and 15 seconds Radiation Exposure Index (if provided by the fluoroscopic device): 63.99 mGy  COMPARISON:  CT 12/07/2016 FINDINGS: Cannulation and opacification of the extrahepatic biliary system. There is a filling defect in the distal common bile duct suggestive for stone. There is also a filling defect in the common hepatic duct suggestive for a stone. Cystic duct is patent with filling of the gallbladder. Balloon was used for stone removal. No large filling defects on the final images. IMPRESSION: Choledocholithiasis and stone removal. These images were submitted for radiologic interpretation only. Please see the procedural report for the amount of contrast and the fluoroscopy time utilized. Electronically Signed   By: Markus Daft M.D.   On: 12/08/2016 14:38      Assessment & Plan   1. Choledocholithiasis post ERCP/sphincterotomy/stone extraction yesterday. Symptoms resolving. LFTs improved. Lipase has normalized. Trend LFTs.   2. Cholelithiasis. Lap chole per [facility].    3. Multiple gastric polyps, previously noted on 2014 EGD. Appears to have more and larger polyps however exam limited with side viewing endsocope. Outpatient follow up with Dr. Henrene Pastor.   4. Hepatomegaly, hepatic steatosis. Outpatient follow up with Dr. Henrene Pastor.   5. Anemia, and Hb is decreasing with hydration, no active GI bleeding. Trend CBC. Outpatient follow up with PCP.   6. AKI improving with hydration.   GI signing off. Please call if needed. Outpatient follow up with Dr. Henrene Pastor.  Principal Problem:   Choledocholithiasis with obstruction Active Problems:   DM2 (diabetes mellitus, type 2) ([place])   Essential hypertension   Acute gallstone pancreatitis  AKI (acute kidney injury) ([place])   Dilation of biliary tract   Elevated LFTs   RUQ abdominal pain    LOS: 2 days   Marceil Welp T. Fuller Plan MD 12/09/2016, 9:01 AM

## 2016-12-09 NOTE — Transfer of Care (Signed)
Immediate Anesthesia Transfer of Care Note  Patient: Rachel Black  Procedure(s) Performed: Procedure(s): LAPAROSCOPIC CHOLECYSTECTOMY WITH INTRAOPERATIVE CHOLANGIOGRAM (N/A)  Patient Location: PACU  Anesthesia Type:General  Level of Consciousness: awake, alert , oriented and patient cooperative  Airway & Oxygen Therapy: Patient Spontanous Breathing and Patient connected to face mask oxygen  Post-op Assessment: Report given to RN, Post -op Vital signs reviewed and stable and Patient moving all extremities X 4  Post vital signs: stable  Last Vitals:  Vitals:   12/09/16 1100 12/09/16 1610  BP: (!) 129/37 (P) 133/76  Pulse: (!) 55 (!) (P) 55  Resp: 16 (P) 15  Temp: 36.9 C (P) 36.8 C    Last Pain:  Vitals:   12/09/16 1100  TempSrc: Oral  PainSc:       Patients Stated Pain Goal: 3 (84/66/59 9357)  Complications: No apparent anesthesia complications

## 2016-12-09 NOTE — Discharge Instructions (Signed)
Laparoscopic Cholecystectomy, Care After This sheet gives you information about how to care for yourself after your procedure. Your health care provider may also give you more specific instructions. If you have problems or questions, contact your health care provider. What can I expect after the procedure? After the procedure, it is common to have:  Pain at your incision sites. You will be given medicines to control this pain.  Mild nausea or vomiting.  Bloating and possible shoulder pain from the air-like gas that was used during the procedure. Follow these instructions at home: Incision care    Follow instructions from your health care provider about how to take care of your incisions. Make sure you:  Wash your hands with soap and water before you change your bandage (dressing). If soap and water are not available, use hand sanitizer.  Change your dressing as told by your health care provider.  Leave stitches (sutures), skin glue, or adhesive strips in place. These skin closures may need to be in place for 2 weeks or longer. If adhesive strip edges start to loosen and curl up, you may trim the loose edges. Do not remove adhesive strips completely unless your health care provider tells you to do that.  Do not take baths, swim, or use a hot tub until your health care provider approves. Ask your health care provider if you can take showers. You may only be allowed to take sponge baths for bathing.  Check your incision area every day for signs of infection. Check for:  More redness, swelling, or pain.  More fluid or blood.  Warmth.  Pus or a bad smell. Activity   Do not drive or use heavy machinery while taking prescription pain medicine.  Do not lift anything that is heavier than 10 lb (4.5 kg) until your health care provider approves.  Do not play contact sports until your health care provider approves.  Do not drive for 24 hours if you were given a medicine to help you relax  (sedative).  Rest as needed. Do not return to work or school until your health care provider approves. General instructions   Take over-the-counter and prescription medicines only as told by your health care provider.  To prevent or treat constipation while you are taking prescription pain medicine, your health care provider may recommend that you:  Drink enough fluid to keep your urine clear or pale yellow.  Take over-the-counter or prescription medicines.  Eat foods that are high in fiber, such as fresh fruits and vegetables, whole grains, and beans.  Limit foods that are high in fat and processed sugars, such as fried and sweet foods. Contact a health care provider if:  You develop a rash.  You have more redness, swelling, or pain around your incisions.  You have more fluid or blood coming from your incisions.  Your incisions feel warm to the touch.  You have pus or a bad smell coming from your incisions.  You have a fever.  One or more of your incisions breaks open. Get help right away if:  You have trouble breathing.  You have chest pain.  You have increasing pain in your shoulders.  You faint or feel dizzy when you stand.  You have severe pain in your abdomen.  You have nausea or vomiting that lasts for more than one day.  You have leg pain. This information is not intended to replace advice given to you by your health care provider. Make sure you discuss any  questions you have with your health care provider. Document Released: 08/31/2005 Document Revised: 03/21/2016 Document Reviewed: 02/17/2016 Elsevier Interactive Patient Education  2017 [place] ______[employer] LAPAROSCOPIC SURGERY: POST OP INSTRUCTIONS Always review your discharge instruction sheet given to you by the facility where your surgery was performed. IF YOU HAVE DISABILITY OR FAMILY LEAVE FORMS, YOU MUST BRING THEM TO THE OFFICE FOR PROCESSING.   DO NOT GIVE  THEM TO YOUR DOCTOR.  1. A prescription for pain medication may be given to you upon discharge.  Take your pain medication as prescribed, if needed.  If narcotic pain medicine is not needed, then you may take acetaminophen (Tylenol) or ibuprofen (Advil) as needed. 2. Take your usually prescribed medications unless otherwise directed. 3. If you need a refill on your pain medication, please contact your pharmacy.  They will contact our office to request authorization. Prescriptions will not be filled after 5pm or on week-ends. 4. You should follow a light diet the first few days after arrival home, such as soup and crackers, etc.  Be sure to include lots of fluids daily. 5. Most patients will experience some swelling and bruising in the area of the incisions.  Ice packs will help.  Swelling and bruising can take several days to resolve.  6. It is common to experience some constipation if taking pain medication after surgery.  Increasing fluid intake and taking a stool softener (such as Colace) will usually help or prevent this problem from occurring.  A mild laxative (Milk of Magnesia or Miralax) should be taken according to package instructions if there are no bowel movements after 48 hours. 7. Unless discharge instructions indicate otherwise, you may remove your bandages 24-48 hours after surgery, and you may shower at that time.  You may have steri-strips (small skin tapes) in place directly over the incision.  These strips should be left on the skin for 7-10 days.  If your surgeon used skin glue on the incision, you may shower in 24 hours.  The glue will flake off over the next 2-3 weeks.  Any sutures or staples will be removed at the office during your follow-up visit. 8. ACTIVITIES:  You may resume regular (light) daily activities beginning the next day--such as daily self-care, walking, climbing stairs--gradually increasing activities as tolerated.  You may have sexual intercourse when it is  comfortable.  Refrain from any heavy lifting or straining until approved by your doctor. a. You may drive when you are no longer taking prescription pain medication, you can comfortably wear a seatbelt, and you can safely maneuver your car and apply brakes. b. RETURN TO WORK:  __________________________________________________________ 9. You should see your doctor in the office for a follow-up appointment approximately 2-3 weeks after your surgery.  Make sure that you call for this appointment within a day or two after you arrive home to insure a convenient appointment time. 10. OTHER INSTRUCTIONS: __________________________________________________________________________________________________________________________ __________________________________________________________________________________________________________________________ WHEN TO CALL YOUR DOCTOR: 1. Fever over 101.0 2. Inability to urinate 3. Continued bleeding from incision. 4. Increased pain, redness, or drainage from the incision. 5. Increasing abdominal pain  The clinic staff is available to answer your questions during regular business hours.  Please dont hesitate to call and ask to speak to one of the nurses for clinical concerns.  If you have a medical emergency, go to the nearest emergency room or call 911.  A surgeon from [facility] is always on call at the hospital. [address]  [address], [place], [place], [place]  [address] ? [address], [place], [place]   [address] [phone] ? [phone] ? FAX [phone] Web site: [email or web address]

## 2016-12-09 NOTE — Anesthesia Procedure Notes (Signed)
Procedure Name: Intubation Date/Time: 12/09/2016 2:19 PM Performed by: Lind Covert Pre-anesthesia Checklist: Patient identified, Emergency Drugs available, Suction available, Patient being monitored and Timeout performed Patient Re-evaluated:Patient Re-evaluated prior to inductionOxygen Delivery Method: Circle system utilized Preoxygenation: Pre-oxygenation with 100% oxygen Intubation Type: IV induction Laryngoscope Size: Mac and 3 Tube type: Oral Tube size: 7.0 mm Number of attempts: 1 Airway Equipment and Method: Stylet Placement Confirmation: ETT inserted through vocal cords under direct vision,  positive ETCO2 and breath sounds checked- equal and bilateral Secured at: 22 cm Tube secured with: Tape Dental Injury: Teeth and Oropharynx as per pre-operative assessment

## 2016-12-09 NOTE — Progress Notes (Signed)
Assumed care of patient from Florene Glen, RN

## 2016-12-09 NOTE — Progress Notes (Signed)
1 Day Post-Op  Subjective: She is doing well this AM  Discussed risk and benefits of surgery and plan surgery later today  Objective: Vital signs in last 24 hours: Temp:  [97.8 F (36.6 C)-98.7 F (37.1 C)] 98.1 F (36.7 C) (03/28 0429) Pulse Rate:  [60-86] 60 (03/28 0429) Resp:  [13-26] 16 (03/28 0429) BP: (100-147)/(55-94) 100/61 (03/28 0429) SpO2:  [96 %-100 %] 98 % (03/28 0429) Last BM Date: 12/08/16 NPO 3300 IV 550 urine recorded Afebrile, VSS Labs OK post ERCP    Intake/Output from previous day: 03/27 0701 - 03/28 0700 In: 3300 [I.V.:3000; IV Piggyback:300] Out: 550 [Urine:550] Intake/Output this shift: No intake/output data recorded.  General appearance: alert, cooperative and no distress GI: soft, non-tender; bowel sounds normal; no masses,  no organomegaly  Lab Results:   Recent Labs  12/07/16 0053 12/09/16 0506  WBC 10.0 4.8  HGB 10.0* 8.9*  HCT 31.7* 28.7*  PLT 120* 107*    BMET  Recent Labs  12/08/16 0456 12/09/16 0506  NA 140 143  K 3.0* 3.6  CL 110 111  CO2 23 22  GLUCOSE 94 85  BUN 18 15  CREATININE 1.40* 1.19*  CALCIUM 8.5* 9.0   PT/INR  Recent Labs  12/07/16 1338  LABPROT 13.6  INR 1.04     Recent Labs Lab 12/07/16 0053 12/08/16 0456 12/09/16 0506  AST 231* 313* 189*  ALT 161* 214* 175*  ALKPHOS 89 89 107  BILITOT 1.2 1.9* 1.1  PROT 6.7 5.8* 6.4*  ALBUMIN 3.8 3.0* 3.3*     Lipase     Component Value Date/Time   LIPASE 34 12/09/2016 0506     Studies/Results: Dg Ercp Biliary & Pancreatic Ducts  Result Date: 12/08/2016 CLINICAL DATA:  Gallstones and concern for a distal common bile duct stone. EXAM: ERCP TECHNIQUE: Multiple spot images obtained with the fluoroscopic device and submitted for interpretation post-procedure. FLUOROSCOPY TIME:  Fluoroscopy Time:  2 minutes and 15 seconds Radiation Exposure Index (if provided by the fluoroscopic device): 63.99 mGy COMPARISON:  CT 12/07/2016 FINDINGS: Cannulation and  opacification of the extrahepatic biliary system. There is a filling defect in the distal common bile duct suggestive for stone. There is also a filling defect in the common hepatic duct suggestive for a stone. Cystic duct is patent with filling of the gallbladder. Balloon was used for stone removal. No large filling defects on the final images. IMPRESSION: Choledocholithiasis and stone removal. These images were submitted for radiologic interpretation only. Please see the procedural report for the amount of contrast and the fluoroscopy time utilized. Electronically Signed   By: Markus Daft M.D.   On: 12/08/2016 14:38    Medications: . allopurinol  300 mg Oral BID  . calcium-vitamin D  1 tablet Oral BID  . insulin aspart  0-9 Units Subcutaneous Q4H  . mouth rinse  15 mL Mouth Rinse BID  . pantoprazole  40 mg Oral BID   . sodium chloride 1,000 mL (12/09/16 7425)   Assessment/Plan Gallstone pancreatitis/choledocholithiasis. Hepatomegaly/hepatic steatosis/mild splenomegaly/thrombocytopenia POSSIBLE NASH Type 2 diabetes Anemia Acute on chronic kidney disease stage II to 3. Acute abdomen was: Polyps last colonoscopy 01/2013. Body mass index is 43.9 Bilateral Knee replacements/osteoarthritis History of rheumatic fever Gout FEN: IV fluid/NPO ID:  Cipro DVT:  SCD ordered   Plan:  Surgery later today.   LOS: 2 days    Rachel Black 12/09/2016 [phone]

## 2016-12-09 NOTE — Op Note (Signed)
Laparoscopic Cholecystectomy with IOC Procedure Note  Indications: This patient presents with choledocholithiasis and will undergo laparoscopic cholecystectomy.  Pre-operative Diagnosis: choledocholithiasis  Post-operative Diagnosis: Same  Surgeon: Stark Klein   Assistants: n/a  Anesthesia: General endotracheal anesthesia and local  ASA Class: 3  Procedure Details  The patient was seen again in the Holding Room. The risks, benefits, complications, treatment options, and expected outcomes were discussed with the patient. The possibilities of  bleeding, recurrent infection, damage to nearby structures, the need for additional procedures, failure to diagnose a condition, the possible need to convert to an open procedure, and creating a complication requiring transfusion or operation were discussed with the patient. The likelihood of improving the patient's symptoms with return to their baseline status is good.    The patient and/or family concurred with the proposed plan, giving informed consent. The site of surgery properly noted. The patient was taken to Operating Room, and the procedure verified as Laparoscopic Cholecystectomy with Intraoperative Cholangiogram. A Time Out was held and the above information confirmed.  Prior to the induction of general anesthesia, antibiotic prophylaxis was administered. General endotracheal anesthesia was then administered and tolerated well. After the induction, the abdomen was prepped with Chloraprep and draped in the sterile fashion. The patient was positioned in the supine position.  Local anesthetic agent was injected into the skin in the epigastrium and an incision made. We dissected down to the abdominal fascia with blunt dissection.  The fascia was incised vertically and we entered the peritoneal cavity bluntly.  A pursestring suture of 0-Vicryl was placed around the fascial opening.  The Hasson cannula was inserted and secured with the stay suture.   Pneumoperitoneum was then created with CO2 and tolerated well without any adverse changes in the patient's vital signs. Two 5-mm ports were placed in the right upper quadrant. There were significant midline adhesions from prior surgery.  The scissors were used to take these down from the periumbilical location.  An 11 mm port was placed in the infraumbilical location.  All skin incisions were infiltrated with a local anesthetic agent before making the incision and placing the trocars.   We positioned the patient in reverse Trendelenburg, tilted slightly to the patient's left.  The gallbladder was identified, the fundus grasped and retracted cephalad. Adhesions were lysed bluntly and with the electrocautery where indicated, taking care not to injure any adjacent organs or viscus. The infundibulum was grasped and retracted laterally, exposing the peritoneum overlying the triangle of Calot. This was then divided and exposed in a blunt fashion. A critical view of the cystic duct and cystic artery was obtained.  The cystic duct was clearly identified and bluntly dissected circumferentially. The cystic duct was ligated with a clip distally.   An incision was made in the cystic duct and the [facility] cholangiogram catheter introduced. The catheter was secured using a clip. A cholangiogram was then performed, demonstrating long cystic duct, good filling of left and right hepatic ducts and the duodenum.  The cystic duct was then ligated with clips and divided. The cystic artery was identified, dissected free, ligated with clips and divided as well.   The gallbladder was dissected from the liver bed in retrograde fashion with the electrocautery. The gallbladder was removed and placed in an Endocatch bag.  The gallbladder and Endocatch bag were then removed through the umbilical port site.  The liver bed was irrigated and inspected. Hemostasis was achieved with the electrocautery. Copious irrigation was utilized and was  repeatedly aspirated until  clear.    We again inspected the right upper quadrant for hemostasis.  Pneumoperitoneum was released as we removed the trocars.   The pursestring suture was used to close the umbilical fascia.  4-0 Monocryl was used to close the skin.   The skin was cleaned and dry, and Dermabond was applied. The patient was then extubated and brought to the recovery room in stable condition. Instrument, sponge, and needle counts were correct at closure and at the conclusion of the case.   Findings: Minimal chronic inflammation.  Very distended gallbladder.    Estimated Blood Loss: min         Drains: none          Specimens: Gallbladder to pathology       Complications: None; patient tolerated the procedure well.         Disposition: PACU - hemodynamically stable.         Condition: stable

## 2016-12-09 NOTE — Anesthesia Postprocedure Evaluation (Signed)
Anesthesia Post Note  Patient: Rachel Black  Procedure(s) Performed: Procedure(s) (LRB): ENDOSCOPIC RETROGRADE CHOLANGIOPANCREATOGRAPHY (ERCP) (N/A)  Patient location during evaluation: PACU Anesthesia Type: General Level of consciousness: awake and alert Pain management: pain level controlled Vital Signs Assessment: post-procedure vital signs reviewed and stable Respiratory status: spontaneous breathing, nonlabored ventilation, respiratory function stable and patient connected to nasal cannula oxygen Cardiovascular status: blood pressure returned to baseline and stable Postop Assessment: no signs of nausea or vomiting Anesthetic complications: no       Last Vitals:  Vitals:   12/08/16 1448 12/09/16 0429  BP: 125/68 100/61  Pulse: 72 60  Resp:  16  Temp: 37.1 C 36.7 C    Last Pain:  Vitals:   12/09/16 0429  TempSrc: Oral  PainSc:                  Riccardo Dubin

## 2016-12-09 NOTE — Anesthesia Postprocedure Evaluation (Signed)
Anesthesia Post Note  Patient: MEGHIN THIVIERGE  Procedure(s) Performed: Procedure(s) (LRB): LAPAROSCOPIC CHOLECYSTECTOMY WITH INTRAOPERATIVE CHOLANGIOGRAM (N/A)  Patient location during evaluation: PACU Anesthesia Type: General Level of consciousness: awake and alert Pain management: pain level controlled Vital Signs Assessment: post-procedure vital signs reviewed and stable Respiratory status: spontaneous breathing, nonlabored ventilation and respiratory function stable Cardiovascular status: blood pressure returned to baseline and stable Postop Assessment: no signs of nausea or vomiting Anesthetic complications: no       Last Vitals:  Vitals:   12/09/16 1700 12/09/16 1727  BP: 129/63 (!) 153/58  Pulse: (!) 55 (!) 56  Resp: 17 20  Temp:  36.7 C    Last Pain:  Vitals:   12/09/16 1610  TempSrc:   PainSc: 2                  Lynda Rainwater

## 2016-12-10 ENCOUNTER — Encounter (HOSPITAL_COMMUNITY): Payer: Self-pay | Admitting: General Surgery

## 2016-12-10 LAB — CBC WITH DIFFERENTIAL/PLATELET
Basophils Absolute: 0 10*3/uL (ref 0.0–0.1)
Basophils Relative: 0 %
Eosinophils Absolute: 0 10*3/uL (ref 0.0–0.7)
Eosinophils Relative: 0 %
HEMATOCRIT: 23.7 % — AB (ref 36.0–46.0)
HEMOGLOBIN: 7.4 g/dL — AB (ref 12.0–15.0)
LYMPHS PCT: 21 %
Lymphs Abs: 1.1 10*3/uL (ref 0.7–4.0)
MCH: 25.9 pg — ABNORMAL LOW (ref 26.0–34.0)
MCHC: 31.2 g/dL (ref 30.0–36.0)
MCV: 82.9 fL (ref 78.0–100.0)
MONOS PCT: 6 %
Monocytes Absolute: 0.3 10*3/uL (ref 0.1–1.0)
NEUTROS ABS: 3.9 10*3/uL (ref 1.7–7.7)
NEUTROS PCT: 74 %
Platelets: 102 10*3/uL — ABNORMAL LOW (ref 150–400)
RBC: 2.86 MIL/uL — AB (ref 3.87–5.11)
RDW: 16.8 % — ABNORMAL HIGH (ref 11.5–15.5)
WBC: 5.3 10*3/uL (ref 4.0–10.5)

## 2016-12-10 LAB — COMPREHENSIVE METABOLIC PANEL
ALT: 118 U/L — ABNORMAL HIGH (ref 14–54)
ANION GAP: 7 (ref 5–15)
AST: 107 U/L — ABNORMAL HIGH (ref 15–41)
Albumin: 2.9 g/dL — ABNORMAL LOW (ref 3.5–5.0)
Alkaline Phosphatase: 90 U/L (ref 38–126)
BUN: 14 mg/dL (ref 6–20)
CO2: 21 mmol/L — AB (ref 22–32)
Calcium: 8.5 mg/dL — ABNORMAL LOW (ref 8.9–10.3)
Chloride: 112 mmol/L — ABNORMAL HIGH (ref 101–111)
Creatinine, Ser: 1.15 mg/dL — ABNORMAL HIGH (ref 0.44–1.00)
GFR calc non Af Amer: 49 mL/min — ABNORMAL LOW (ref >60)
GFR, EST AFRICAN AMERICAN: 57 mL/min — AB (ref >60)
Glucose, Bld: 106 mg/dL — ABNORMAL HIGH (ref 65–99)
Potassium: 4.2 mmol/L (ref 3.5–5.1)
SODIUM: 140 mmol/L (ref 135–145)
TOTAL PROTEIN: 5.7 g/dL — AB (ref 6.5–8.1)
Total Bilirubin: 1 mg/dL (ref 0.3–1.2)

## 2016-12-10 LAB — GLUCOSE, CAPILLARY
GLUCOSE-CAPILLARY: 141 mg/dL — AB (ref 65–99)
GLUCOSE-CAPILLARY: 93 mg/dL (ref 65–99)
GLUCOSE-CAPILLARY: 99 mg/dL (ref 65–99)
Glucose-Capillary: 141 mg/dL — ABNORMAL HIGH (ref 65–99)
Glucose-Capillary: 105 mg/dL — ABNORMAL HIGH (ref 65–99)
Glucose-Capillary: 101 mg/dL — ABNORMAL HIGH (ref 65–99)

## 2016-12-10 LAB — PREPARE RBC (CROSSMATCH)

## 2016-12-10 MED ORDER — SODIUM CHLORIDE 0.9 % IV SOLN
Freq: Once | INTRAVENOUS | Status: AC
  Administered 2016-12-10: 14:00:00 via INTRAVENOUS

## 2016-12-10 MED ORDER — ACETAMINOPHEN 325 MG PO TABS: ORAL_TABLET | ORAL | 2 refills | Status: DC

## 2016-12-10 MED ORDER — OXYCODONE-ACETAMINOPHEN 5-325 MG PO TABS: 1.0000 | ORAL_TABLET | ORAL | 0 refills | Status: DC | PRN

## 2016-12-10 NOTE — Progress Notes (Signed)
Patient was receiving blood transfusion when her IV site was lost. Administration was stopped, IV team was paged. Awaiting new IV site before starting transfusion again.

## 2016-12-10 NOTE — Progress Notes (Signed)
1 Day Post-Op  Subjective: She is sore this a.m. but all her port sites look good.she is ambulating to the bathroom. She has a regular cardiac/heart healthy diet ordered. No significant complaints this a.m.  Objective: Vital signs in last 24 hours: Temp:  [98 F (36.7 C)-99.7 F (37.6 C)] 98.8 F (37.1 C) (03/29 0405) Pulse Rate:  [47-57] 51 (03/29 0405) Resp:  [14-20] 18 (03/29 0405) BP: (129-162)/(34-76) 136/65 (03/29 0405) SpO2:  [96 %-100 %] 96 % (03/29 0405) Last BM Date: 12/09/16 Nothing oral recorded IV 3850 Urine 525 recorded Afebrile vital signs are stable No labs this a.m. CMK:[id number] fills the biliary tree and duodenum without filling defects in the common bile duct. The common bile duct is dilated     Intake/Output from previous day: 03/28 0701 - 03/29 0700 In: 3850 [I.V.:3850] Out: 575 [Urine:525; Blood:50] Intake/Output this shift: No intake/output data recorded.  General appearance: alert, cooperative and no distress GI: Soft, positive bowel sounds, still sore. Port sites all look good.  Lab Results:   Recent Labs  12/09/16 0506  WBC 4.8  HGB 8.9*  HCT 28.7*  PLT 107*    BMET  Recent Labs  12/08/16 0456 12/09/16 0506  NA 140 143  K 3.0* 3.6  CL 110 111  CO2 23 22  GLUCOSE 94 85  BUN 18 15  CREATININE 1.40* 1.19*  CALCIUM 8.5* 9.0   PT/INR  Recent Labs  12/07/16 1338  LABPROT 13.6  INR 1.04     Recent Labs Lab 12/07/16 0053 12/08/16 0456 12/09/16 0506  AST 231* 313* 189*  ALT 161* 214* 175*  ALKPHOS 89 89 107  BILITOT 1.2 1.9* 1.1  PROT 6.7 5.8* 6.4*  ALBUMIN 3.8 3.0* 3.3*     Lipase     Component Value Date/Time   LIPASE 34 12/09/2016 0506     Studies/Results: Dg Cholangiogram Operative  Result Date: 12/09/2016 CLINICAL DATA:  Right upper quadrant pain EXAM: INTRAOPERATIVE CHOLANGIOGRAM TECHNIQUE: Cholangiographic images from the C-arm fluoroscopic device were submitted for interpretation post-operatively.  Please see the procedural report for the amount of contrast and the fluoroscopy time utilized. COMPARISON:  None. FINDINGS: Contrast fills the biliary tree and duodenum without filling defects in the common bile duct. The common bile duct is dilated. IMPRESSION: Patent biliary tree. Electronically Signed   By: Marybelle Killings M.D.   On: 12/09/2016 16:42   Dg Ercp Biliary & Pancreatic Ducts  Result Date: 12/08/2016 CLINICAL DATA:  Gallstones and concern for a distal common bile duct stone. EXAM: ERCP TECHNIQUE: Multiple spot images obtained with the fluoroscopic device and submitted for interpretation post-procedure. FLUOROSCOPY TIME:  Fluoroscopy Time:  2 minutes and 15 seconds Radiation Exposure Index (if provided by the fluoroscopic device): 63.99 mGy COMPARISON:  CT 12/07/2016 FINDINGS: Cannulation and opacification of the extrahepatic biliary system. There is a filling defect in the distal common bile duct suggestive for stone. There is also a filling defect in the common hepatic duct suggestive for a stone. Cystic duct is patent with filling of the gallbladder. Balloon was used for stone removal. No large filling defects on the final images. IMPRESSION: Choledocholithiasis and stone removal. These images were submitted for radiologic interpretation only. Please see the procedural report for the amount of contrast and the fluoroscopy time utilized. Electronically Signed   By: Markus Daft M.D.   On: 12/08/2016 14:38    Medications: . allopurinol  300 mg Oral BID  . calcium-vitamin D  1 tablet Oral BID  .  insulin aspart  0-9 Units Subcutaneous Q4H  . mouth rinse  15 mL Mouth Rinse BID  . pantoprazole  40 mg Oral BID    Assessment/Plan Gallstone pancreatitis/choledocholithiasis S/P ERCP 12/08/16 Dr. Lucio Edward. S/p laparoscopic cholecystectomy with IOC, 12/09/16 Dr. Dorthula Matas POD 1 Hepatomegaly/hepatic steatosis/mild splenomegaly/thrombocytopenia POSSIBLE NASH Type 2 diabetes Anemia Acute on  chronic kidney disease stage II to 3. Acute abdomen was: Polyps last colonoscopy 01/2013. Body mass index is 43.9 Bilateral Knee replacements/osteoarthritis History of rheumatic fever Gout FEN: IV fluid/cardiac diet ID:  Cipro  preop DVT:  SCD ordered  Plan: Mobilize. Po pain medications, saline lock IV. Creatinine was up earlier so wouldn't restrict her to Tylenol and Percocet for pain. She can go home later today when she ambulates safely, tolerates her diet, and tolerated oral pain meds. Follow-up information is in the AVS.  I have personally reviewed the patients medication history on the [place] controlled substance database.   LOS: 3 days    Andres Bantz 12/10/2016 [phone]

## 2016-12-10 NOTE — Progress Notes (Signed)
Nutrition Brief Follow-up  Patient now on heart healthy diet and eating 100%.   No nutrition interventions warranted at this time. If nutrition issues arise, please consult RD.   Clayton Bibles, MS, RD, LDN Pager: [phone] After Hours Pager: [phone]

## 2016-12-11 LAB — BPAM RBC
BLOOD PRODUCT EXPIRATION DATE: 201804182359
ISSUE DATE / TIME: 201803291319
UNIT TYPE AND RH: 6200

## 2016-12-11 LAB — TYPE AND SCREEN
ABO/RH(D): A POS
ANTIBODY SCREEN: NEGATIVE
Unit division: 0

## 2016-12-11 NOTE — Discharge Summary (Signed)
[facility] Discharge Summary   Patient: Rachel Black QJF:[id number]   PCP: Ann Held, DO DOB: 1951-08-29   Date of admission: 12/07/2016   Date of discharge: 12/10/2016   Discharge Diagnoses:  Principal Problem:   Choledocholithiasis with obstruction Active Problems:   DM2 (diabetes mellitus, type 2) ([place])   Essential hypertension   Acute gallstone pancreatitis   AKI (acute kidney injury) ([place])   Dilation of biliary tract   Elevated LFTs   RUQ abdominal pain   Admitted From: home Disposition:  Home with family  Recommendations for Outpatient Follow-up:  1. Please follow up with PCP in 1 week with CBC 2. follow up with surgery as Recommended 3. Follow up with Dr Henrene Pastor, gastroenterology as needed.  Follow-up Information    [facility] Follow up on 01/05/2017.   Specialty:  General Surgery Why:  Your appointment is at 11:45 AM, be at the office 30 minutes early for check in.  Bring your photo ID and insurance information.   Contact information: [address] [place]  [phone]        Yvonne R Lowne Chase, DO. Schedule an appointment as soon as possible for a visit in 2 week(s).   Specialty:  Family Medicine Why:  with CMP Contact information: [address] [place] [address] [phone]          Diet recommendation: soft diet  Activity: The patient is advised to gradually reintroduce usual activities.  Discharge Condition: good  Code Status: full code  History of present illness: As per the H and P dictated on admission, "Rachel Black is a 66 y.o. female with medical history significant of DM2, obesity, HTN, gout.  Patient presents to the ED with c/o 9/10 severity, sharp quality abdominal pain.  Pain located in epigastric and RUQ.  Pain radiates to back.  Pain worsened over past several hours.  Onset initially 2-3 weeks ago, had been waxing and waning.  Associated nausea and  vomiting including in ED"  Hospital Course:  Summary of her active problems in the hospital is as following.  1. Choledocholithiasis with obstruction    Acute gallstone pancreatitis Pt was given aggressive IV fluids.  Work up showed 12 mm CBD with stones and sludge in gallbladder. No cholecystitis  Gastroenterology consulted, pt is S/P ERCP with CBD stone removal. Does not have any complication from ERCP. Underwent Laparoscopic cholecystectomy 12/09/2016. Tolerated well, was tolerating oral diet and ambulating. Pain medication has been prescribed by surgery, recommended pt to avoid work that needs more focus and attention while she is on pain medications GI has signed off, follow up with Dr. Henrene Pastor.  2. post operative blood loss anemia acute Got 1 PRBC No active bleeding reported  Recheck with PCP in 1 week  3. Essential hypertension. The patient is stable. Will resume home medication.  4. Acute kidney injury. CKD stage II. Currently renal function stable and improving. We will monitor.  5-obesity -Body mass index is 43.93 kg/m. -low calorie diet and increase exercise discussed with patient   6-hypokalemia Resolved   7-GERD -continue PPI  8. Type 2 diabetes mellitus. Continue home regimen,  All other chronic medical condition were stable during the hospitalization.  Patient was ambulatory without any assistance. On the day of the discharge the patient's vitals were stable, and no other acute medical condition were reported by patient. the patient was felt safe to be discharge at home with family.  Procedures and Results:  ERCP  LAP [facility]LE   Consultations:  Gastroenterology   General surgery  DISCHARGE MEDICATION: Discharge Medication List as of 12/10/2016  5:30 PM    START taking these medications   Details  acetaminophen (TYLENOL) 325 MG tablet Follow package instructions, you can take 2 tablets every 4 hours as needed. Your prescribed pain  medication also has Tylenol/acetaminophen in it. You cannot take more than 4000 mg per day. Count each prescribed pain medication as a Tylenol tablet also.,  No Print    oxyCODONE-acetaminophen (PERCOCET/ROXICET) 5-325 MG tablet Take 1-2 tablets by mouth every 4 (four) hours as needed for moderate pain., Starting Thu 12/10/2016, Print      CONTINUE these medications which have NOT CHANGED   Details  allopurinol (ZYLOPRIM) 300 MG tablet TAKE 1 TABLET (300 MG TOTAL) BY MOUTH 2 (TWO) TIMES DAILY., Normal    calcium carbonate (TUMS - DOSED IN MG ELEMENTAL CALCIUM) 500 MG chewable tablet Chew 2 tablets by mouth 3 (three) times daily as needed for indigestion or heartburn., Until Discontinued, Historical Med    calcium-vitamin D (OSCAL WITH D) 500-200 MG-UNIT per tablet Take 1 tablet by mouth 2 (two) times daily., Until Discontinued, Historical Med    DEXILANT 60 MG capsule TAKE 1 CAPSULE (60 MG TOTAL) BY MOUTH EVERY MORNING, Normal    fenofibrate 160 MG tablet TAKE 1 TABLET (160 MG TOTAL) BY MOUTH DAILY., Starting Tue 10/13/2016, Normal    JANUMET XR [phone] MG TB24 TAKE 1 TABLET BY MOUTH EVERY DAY, Normal    ONE TOUCH ULTRA TEST test strip ONETOUCH ULTRA BLUE TEST STRIPS CHECK BLOOD SUGAR DAILY. DX: E11.9, Normal    ONETOUCH DELICA LANCETS FINE MISC Check glucose qd, Normal    rosuvastatin (CRESTOR) 20 MG tablet Take 1 tablet (20 mg total) by mouth daily., Starting Thu 02/13/2016, Normal    valsartan-hydrochlorothiazide (DIOVAN-HCT) 80-12.5 MG tablet Take 1 tablet by mouth daily., Starting Thu 10/29/2016, Normal       Allergies  Allergen Reactions  . Aspirin     Regular ASA-stomach upset  . Other     SOME BANDAIDS CAUSE SKIN IRRITATION  . Penicillins Itching    Has patient had a PCN reaction causing immediate rash, facial/tongue/throat swelling, SOB or lightheadedness with hypotension: No Has patient had a PCN reaction causing severe rash involving mucus membranes or skin necrosis:  No Has patient had a PCN reaction that required hospitalization No Has patient had a PCN reaction occurring within the last 10 years: No If all of the above answers are "NO", then may proceed with Cephalosporin use.    Discharge Instructions    Diet fat modified    Complete by:  As directed    Discharge instructions    Complete by:  As directed    It is important that you read following instructions as well as go over your medication list with RN to help you understand your care after this hospitalization.  Discharge Instructions: Please follow-up with PCP in one week  Please request your primary care physician to go over all Hospital Tests and Procedure/Radiological results at the follow up,  Please get all Hospital records sent to your PCP by signing hospital release before you go home.   Do not drive, operating heavy machinery, perform activities at heights, swimming or participation in water activities or provide baby sitting services while you are on Pain, Sleep and Anxiety Medications; until you have been seen by Primary Care Physician or a Neurologist  and advised to do so again. Do not take more than prescribed Pain, Sleep and Anxiety Medications. You were cared for by a hospitalist during your hospital stay. If you have any questions about your discharge medications or the care you received while you were in the hospital after you are discharged, you can call the unit and ask to speak with the hospitalist on call if the hospitalist that took care of you is not available.  Once you are discharged, your primary care physician will handle any further medical issues. Please note that NO REFILLS for any discharge medications will be authorized once you are discharged, as it is imperative that you return to your primary care physician (or establish a relationship with a primary care physician if you do not have one) for your aftercare needs so that they can reassess your need for medications  and monitor your lab values. You Must read complete instructions/literature along with all the possible adverse reactions/side effects for all the Medicines you take and that have been prescribed to you. Take any new Medicines after you have completely understood and accept all the possible adverse reactions/side effects. Wear Seat belts while driving. If you have smoked or chewed Tobacco in the last 2 yrs please stop smoking and/or stop any Recreational drug use.   Increase activity slowly    Complete by:  As directed      Discharge Exam: Filed Weights   12/07/16 0035 12/07/16 0600  Weight: 99.8 kg (220 lb) 123.5 kg (272 lb 3.2 oz)   Vitals:   12/10/16 1528 12/10/16 1705  BP: (!) 133/59 (!) 152/63  Pulse: (!) 50 (!) 59  Resp: 20 20  Temp: 98.5 F (36.9 C) 99 F (37.2 C)   General: Appear in no distress, no Rash; Oral Mucosa moist. Cardiovascular: S1 and S2 Present, no Murmur, no JVD Respiratory: Bilateral Air entry present and Clear to Auscultation, no Crackles, no wheezes Abdomen: Bowel Sound present, Soft and mild tenderness Extremities: no Pedal edema, n calf tenderness Neurology: Grossly no focal neuro deficit.  The results of significant diagnostics from this hospitalization (including imaging, microbiology, ancillary and laboratory) are listed below for reference.    Significant Diagnostic Studies: Dg Cholangiogram Operative  Result Date: 12/09/2016 CLINICAL DATA:  Right upper quadrant pain EXAM: INTRAOPERATIVE CHOLANGIOGRAM TECHNIQUE: Cholangiographic images from the C-arm fluoroscopic device were submitted for interpretation post-operatively. Please see the procedural report for the amount of contrast and the fluoroscopy time utilized. COMPARISON:  None. FINDINGS: Contrast fills the biliary tree and duodenum without filling defects in the common bile duct. The common bile duct is dilated. IMPRESSION: Patent biliary tree. Electronically Signed   By: Marybelle Killings M.D.   On:  12/09/2016 16:42   Ct Abdomen Pelvis W Contrast  Result Date: 12/07/2016 CLINICAL DATA:  Right upper quadrant pain elevated lipase and elevated LFTs. EXAM: CT ABDOMEN AND PELVIS WITH CONTRAST TECHNIQUE: Multidetector CT imaging of the abdomen and pelvis was performed using the standard protocol following bolus administration of intravenous contrast. CONTRAST:  80 cc Isovue-300 IV COMPARISON:  None. FINDINGS: Lower chest: Mild dependent atelectasis at the lung bases. No pleural fluid. Hepatobiliary: The liver is enlarged spanning 25.7 cm cranial caudal. There is diffusely decreased hepatic density. No evidence of focal hepatic lesion. There is intra and extrahepatic biliary ductal dilatation, with common bile duct measuring approximately 17 mm at the porta hepatis. Gallbladder is mildly distended. No calcified gallstone. Questionable soft tissue density/filling defect involving the distal common bile  duct. Pancreas: Proximal pancreatic duct is borderline measuring 4 mm. No peripancreatic inflammation. No discrete pancreatic lesion. Spleen: Mildly enlarged with lobular contours. Maximal AP dimension 14 cm. No focal splenic abnormality. Adrenals/Urinary Tract: Normal adrenal glands. No hydronephrosis. Mild nonspecific perinephric edema about both kidneys. Tiny cortical hypodensity mid left kidney is too small to accurately characterize, likely simple cysts. Urinary bladder is decompressed. Stomach/Bowel: Fluid within the distal esophagus, small hiatal hernia. Stomach is distended with ingested contents. Colonic diverticulosis from the splenic flexure through the sigmoid without acute inflammation. Small bowel is decompressed. Vascular/Lymphatic: No significant vascular findings are present. No enlarged abdominal or pelvic lymph nodes. Reproductive: Status post hysterectomy. No adnexal masses. Other: No ascites. No free air or intra-abdominal fluid collection. Postsurgical changes of ventral abdominal wall without  discrete hernia. Musculoskeletal: There are no acute or suspicious osseous abnormalities. Degenerative change in the lumbar spine with primarily facet arthropathy. IMPRESSION: 1. Intra and extrahepatic biliary dilatation with possible soft tissue density/filling defect in the distal common bile duct. Ultrasound recommended for initial evaluation, ultimately MRCP may be needed for more detailed evaluation of the biliary tree. Prominence of the proximal pancreatic duct without peripancreatic inflammation. 2. Hepatomegaly and hepatic steatosis.  Mild splenomegaly. 3. Small hiatal hernia. Fluid in the distal esophagus can be seen with reflux or delayed transit. 4. Colonic diverticulosis without acute inflammation. Electronically Signed   By: Jeb Levering M.D.   On: 12/07/2016 03:09   Dg Ercp Biliary & Pancreatic Ducts  Result Date: 12/08/2016 CLINICAL DATA:  Gallstones and concern for a distal common bile duct stone. EXAM: ERCP TECHNIQUE: Multiple spot images obtained with the fluoroscopic device and submitted for interpretation post-procedure. FLUOROSCOPY TIME:  Fluoroscopy Time:  2 minutes and 15 seconds Radiation Exposure Index (if provided by the fluoroscopic device): 63.99 mGy COMPARISON:  CT 12/07/2016 FINDINGS: Cannulation and opacification of the extrahepatic biliary system. There is a filling defect in the distal common bile duct suggestive for stone. There is also a filling defect in the common hepatic duct suggestive for a stone. Cystic duct is patent with filling of the gallbladder. Balloon was used for stone removal. No large filling defects on the final images. IMPRESSION: Choledocholithiasis and stone removal. These images were submitted for radiologic interpretation only. Please see the procedural report for the amount of contrast and the fluoroscopy time utilized. Electronically Signed   By: Markus Daft M.D.   On: 12/08/2016 14:38   US Abdomen Limited Ruq  Result Date: 12/07/2016 CLINICAL  DATA:  Right upper quadrant abdominal pain. EXAM: US ABDOMEN LIMITED - RIGHT UPPER QUADRANT COMPARISON:  CT abdomen/ pelvis earlier this day. FINDINGS: Gallbladder: Gallbladder is distended and contains sludge and small stones. Borderline gallbladder wall thickness of 3 mm. No sonographic Murphy sign noted by sonographer. No pericholecystic fluid. Common bile duct: Diameter: 12 mm at the porta hepatis. Distal common bile duct is not well visualized. Liver: No focal lesion identified. Diffusely increased in parenchymal echogenicity. Intrahepatic biliary ductal dilatation, better appreciated on CT. Normal directional flow in the main portal vein. IMPRESSION: 1. Stones and sludge within the gallbladder with borderline gallbladder wall thickening. Negative sonographic Murphy sign. 2. Biliary dilatation, cause not identified sonographically. MRCP may be of value for further evaluation. 3. Hepatic steatosis. Electronically Signed   By: Jeb Levering M.D.   On: 12/07/2016 04:42    Microbiology: Recent Results (from the past 240 hour(s))  Culture, Urine     Status: Abnormal   Collection Time: 12/07/16 12:38 AM  Result Value Ref Range Status   Specimen Description URINE, RANDOM  Final   Special Requests NONE  Final   Culture MULTIPLE SPECIES PRESENT, SUGGEST RECOLLECTION (A)  Final   Report Status 12/08/2016 FINAL  Final  Surgical pcr screen     Status: None   Collection Time: 12/09/16  4:33 AM  Result Value Ref Range Status   MRSA, PCR NEGATIVE NEGATIVE Final   Staphylococcus aureus NEGATIVE NEGATIVE Final    Comment:        The Xpert SA Assay (FDA approved for NASAL specimens in patients over 70 years of age), is one component of a comprehensive surveillance program.  Test performance has been validated by [facility] for patients greater than or equal to 70 year old. It is not intended to diagnose infection nor to guide or monitor treatment.      Labs: CBC:  Recent Labs Lab  12/07/16 0053 12/09/16 0506 12/10/16 0823  WBC 10.0 4.8 5.3  NEUTROABS 7.4  --  3.9  HGB 10.0* 8.9* 7.4*  HCT 31.7* 28.7* 23.7*  MCV 83.2 84.2 82.9  PLT 120* 107* 161*   Basic Metabolic Panel:  Recent Labs Lab 12/07/16 0053 12/08/16 0456 12/09/16 0506 12/10/16 0823  NA 140 140 143 140  K 3.4* 3.0* 3.6 4.2  CL 107 110 111 112*  CO2 22 23 22  21*  GLUCOSE 141* 94 85 106*  BUN 22* 18 15 14   CREATININE 1.51* 1.40* 1.19* 1.15*  CALCIUM 9.5 8.5* 9.0 8.5*   Liver Function Tests:  Recent Labs Lab 12/07/16 0053 12/08/16 0456 12/09/16 0506 12/10/16 0823  AST 231* 313* 189* 107*  ALT 161* 214* 175* 118*  ALKPHOS 89 89 107 90  BILITOT 1.2 1.9* 1.1 1.0  PROT 6.7 5.8* 6.4* 5.7*  ALBUMIN 3.8 3.0* 3.3* 2.9*    Recent Labs Lab 12/07/16 0053 12/09/16 0506  LIPASE 277* 34   No results for input(s): AMMONIA in the last 168 hours. Cardiac Enzymes: No results for input(s): CKTOTAL, CKMB, CKMBINDEX, TROPONINI in the last 168 hours. BNP (last 3 results) No results for input(s): BNP in the last 8760 hours. CBG:  Recent Labs Lab 12/09/16 2339 12/10/16 0356 12/10/16 0750 12/10/16 1220 12/10/16 1606  GLUCAP 141* 105* 99 93 101*   Time spent: 35 minutes  Signed:  Berle Mull  [facility] 12/10/2016  3:36 PM

## 2016-12-14 ENCOUNTER — Telehealth: Payer: Self-pay | Admitting: Behavioral Health

## 2016-12-14 NOTE — Telephone Encounter (Signed)
Patient voiced that she's doing alright post hospitalization. Appointment scheduled with PCP for follow-up visit on 12/18/16 at 11:30 AM.

## 2016-12-18 ENCOUNTER — Encounter: Payer: Self-pay | Admitting: Family Medicine

## 2016-12-18 ENCOUNTER — Telehealth: Payer: Self-pay | Admitting: *Deleted

## 2016-12-18 ENCOUNTER — Ambulatory Visit (INDEPENDENT_AMBULATORY_CARE_PROVIDER_SITE_OTHER): Payer: BLUE CROSS/BLUE SHIELD | Admitting: Family Medicine

## 2016-12-18 ENCOUNTER — Other Ambulatory Visit: Payer: Self-pay | Admitting: Family Medicine

## 2016-12-18 VITALS — BP 126/71 | HR 70 | Temp 98.5°F | Wt 273.2 lb

## 2016-12-18 DIAGNOSIS — I10 Essential (primary) hypertension: Secondary | ICD-10-CM

## 2016-12-18 DIAGNOSIS — K8051 Calculus of bile duct without cholangitis or cholecystitis with obstruction: Secondary | ICD-10-CM

## 2016-12-18 DIAGNOSIS — E785 Hyperlipidemia, unspecified: Secondary | ICD-10-CM

## 2016-12-18 DIAGNOSIS — E876 Hypokalemia: Secondary | ICD-10-CM

## 2016-12-18 DIAGNOSIS — N179 Acute kidney failure, unspecified: Secondary | ICD-10-CM | POA: Diagnosis not present

## 2016-12-18 DIAGNOSIS — D649 Anemia, unspecified: Secondary | ICD-10-CM | POA: Diagnosis not present

## 2016-12-18 DIAGNOSIS — E118 Type 2 diabetes mellitus with unspecified complications: Secondary | ICD-10-CM

## 2016-12-18 LAB — COMPREHENSIVE METABOLIC PANEL
ALK PHOS: 60 U/L (ref 39–117)
ALT: 17 U/L (ref 0–35)
AST: 20 U/L (ref 0–37)
Albumin: 3.8 g/dL (ref 3.5–5.2)
BUN: 11 mg/dL (ref 6–23)
CALCIUM: 9.1 mg/dL (ref 8.4–10.5)
CHLORIDE: 104 meq/L (ref 96–112)
CO2: 30 mEq/L (ref 19–32)
Creatinine, Ser: 0.99 mg/dL (ref 0.40–1.20)
GFR: 59.92 mL/min — ABNORMAL LOW (ref >60.00)
Glucose, Bld: 108 mg/dL — ABNORMAL HIGH (ref 70–99)
POTASSIUM: 2.8 meq/L — AB (ref 3.5–5.1)
Sodium: 144 mEq/L (ref 135–145)
TOTAL PROTEIN: 6 g/dL (ref 6.0–8.3)
Total Bilirubin: 0.7 mg/dL (ref 0.2–1.2)

## 2016-12-18 LAB — HEMOGLOBIN A1C: Hgb A1c MFr Bld: 5.4 % (ref 4.6–6.5)

## 2016-12-18 LAB — LIPID PANEL
CHOL/HDL RATIO: 3
CHOLESTEROL: 97 mg/dL (ref 0–200)
HDL: 29.8 mg/dL — ABNORMAL LOW (ref >39.00)
NonHDL: 67.51
TRIGLYCERIDES: 216 mg/dL — AB (ref 0.0–149.0)
VLDL: 43.2 mg/dL — ABNORMAL HIGH (ref 0.0–40.0)

## 2016-12-18 LAB — CBC
HEMATOCRIT: 30.3 % — AB (ref 36.0–46.0)
Hemoglobin: 9.8 g/dL — ABNORMAL LOW (ref 12.0–15.0)
MCHC: 32.4 g/dL (ref 30.0–36.0)
MCV: 83.2 fl (ref 78.0–100.0)
PLATELETS: 72 10*3/uL — AB (ref 150.0–400.0)
RBC: 3.65 Mil/uL — AB (ref 3.87–5.11)
RDW: 17.2 % — ABNORMAL HIGH (ref 11.5–15.5)
WBC: 5.2 10*3/uL (ref 4.0–10.5)

## 2016-12-18 LAB — LDL CHOLESTEROL, DIRECT: Direct LDL: 37 mg/dL

## 2016-12-18 LAB — TSH: TSH: 3.59 u[IU]/mL (ref 0.35–4.50)

## 2016-12-18 MED ORDER — POTASSIUM CHLORIDE CRYS ER 20 MEQ PO TBCR: EXTENDED_RELEASE_TABLET | ORAL | 1 refills | Status: DC

## 2016-12-18 NOTE — Telephone Encounter (Signed)
Called the patient informed of results/instructions. Sent in potassium pills/scheduled lab/put in order. She verbalized understanidng.

## 2016-12-18 NOTE — Progress Notes (Signed)
Pre visit review using our clinic review tool, if applicable. No additional management support is needed unless otherwise documented below in the visit note. 

## 2016-12-18 NOTE — Patient Instructions (Signed)
Encouraged to a Woman's Day Vitamin daily. Spoke with you about the NEW Shingles Vaccine (Shingrix). Carbohydrate Counting for Diabetes Mellitus, Adult Carbohydrate counting is a method for keeping track of how many carbohydrates you eat. Eating carbohydrates naturally increases the amount of sugar (glucose) in the blood. Counting how many carbohydrates you eat helps keep your blood glucose within normal limits, which helps you manage your diabetes (diabetes mellitus). It is important to know how many carbohydrates you can safely have in each meal. This is different for every person. A diet and nutrition specialist (registered dietitian) can help you make a meal plan and calculate how many carbohydrates you should have at each meal and snack. Carbohydrates are found in the following foods:  Grains, such as breads and cereals.  Dried beans and soy products.  Starchy vegetables, such as potatoes, peas, and corn.  Fruit and fruit juices.  Milk and yogurt.  Sweets and snack foods, such as cake, cookies, candy, chips, and soft drinks. How do I count carbohydrates? There are two ways to count carbohydrates in food. You can use either of the methods or a combination of both. Reading "Nutrition Facts" on packaged food  The "Nutrition Facts" list is included on the labels of almost all packaged foods and beverages in the [place] It includes:  The serving size.  Information about nutrients in each serving, including the grams (g) of carbohydrate per serving. To use the "Nutrition Facts":  Decide how many servings you will have.  Multiply the number of servings by the number of carbohydrates per serving.  The resulting number is the total amount of carbohydrates that you will be having. Learning standard serving sizes of other foods  When you eat foods containing carbohydrates that are not packaged or do not include "Nutrition Facts" on the label, you need to measure the servings in order to count  the amount of carbohydrates:  Measure the foods that you will eat with a food scale or measuring cup, if needed.  Decide how many standard-size servings you will eat.  Multiply the number of servings by 15. Most carbohydrate-rich foods have about 15 g of carbohydrates per serving.  For example, if you eat 8 oz (170 g) of strawberries, you will have eaten 2 servings and 30 g of carbohydrates (2 servings x 15 g = 30 g).  For foods that have more than one food mixed, such as soups and casseroles, you must count the carbohydrates in each food that is included. The following list contains standard serving sizes of common carbohydrate-rich foods. Each of these servings has about 15 g of carbohydrates:   hamburger bun or  English muffin.   oz (15 mL) syrup.   oz (14 g) jelly.  1 slice of bread.  1 six-inch tortilla.  3 oz (85 g) cooked rice or pasta.  4 oz (113 g) cooked dried beans.  4 oz (113 g) starchy vegetable, such as peas, corn, or potatoes.  4 oz (113 g) hot cereal.  4 oz (113 g) mashed potatoes or  of a large baked potato.  4 oz (113 g) canned or frozen fruit.  4 oz (120 mL) fruit juice.  4-6 crackers.  6 chicken nuggets.  6 oz (170 g) unsweetened dry cereal.  6 oz (170 g) plain fat-free yogurt or yogurt sweetened with artificial sweeteners.  8 oz (240 mL) milk.  8 oz (170 g) fresh fruit or one small piece of fruit.  24 oz (680 g) popped popcorn.  Example of carbohydrate counting Sample meal   3 oz (85 g) chicken breast.  6 oz (170 g) brown rice.  4 oz (113 g) corn.  8 oz (240 mL) milk.  8 oz (170 g) strawberries with sugar-free whipped topping. Carbohydrate calculation  1. Identify the foods that contain carbohydrates:  Rice.  Corn.  Milk.  Strawberries. 2. Calculate how many servings you have of each food:  2 servings rice.  1 serving corn.  1 serving milk.  1 serving strawberries. 3. Multiply each number of servings by 15  g:  2 servings rice x 15 g = 30 g.  1 serving corn x 15 g = 15 g.  1 serving milk x 15 g = 15 g.  1 serving strawberries x 15 g = 15 g. 4. Add together all of the amounts to find the total grams of carbohydrates eaten:  30 g + 15 g + 15 g + 15 g = 75 g of carbohydrates total. This information is not intended to replace advice given to you by your health care provider. Make sure you discuss any questions you have with your health care provider. Document Released: 08/31/2005 Document Revised: 03/20/2016 Document Reviewed: 02/12/2016 Elsevier Interactive Patient Education  2017 [employer].

## 2016-12-18 NOTE — Telephone Encounter (Signed)
[facility] lab reporting critical potassium @ 2.8

## 2016-12-18 NOTE — Assessment & Plan Note (Signed)
>>  ASSESSMENT AND PLAN FOR MIXED HYPERLIPIDEMIA WRITTEN ON 12/18/2016 12:58 PM BY LOWNE CHASE, YVONNE R, DO  Encouraged heart healthy diet, increase exercise, avoid trans fats, consider a krill oil cap daily

## 2016-12-18 NOTE — Telephone Encounter (Signed)
kcl 20 meq  2 po qd x 2 days then 1 po qd--- repeat bmp next week

## 2016-12-18 NOTE — Progress Notes (Signed)
Patient ID: Rachel Black, female   DOB: 1951/04/29, 66 y.o.   MRN: [id number]   Subjective:  I acted as a Education administrator for [employer], DO. Raiford [place], [place]   Patient ID: Rachel Black, female    DOB: 1950/11/12, 66 y.o.   MRN: [id number]  Chief Complaint  Patient presents with  . Follow-up    Hospital F/U.     HPI  Patient is in today for a Hospital follow up. Patient was admitted to the Hospital on 12/07/2016. States that Gallstones were removed on 12/08/2016 and had her Gall Bladder removed on 12/09/2016. Patient has a Hx of HTN, Type II Diabetes, hyperlipidemia. Patient has no acute concerns noted at this time.  Patient Care Team: Ann Held, DO as PCP - General   Past Medical History:  Diagnosis Date  . Arthritis    PAIN AND OA BOTH KNEES AND SHOULDERS AND ELBOWS AND WRIST  . Diabetes mellitus    ORAL MEDICATION  . GERD (gastroesophageal reflux disease)   . Gout    NO RECENT FLARE UPS  . H/O: rheumatic fever    AS A CHILD - NO KNOWN HEART MURMUR OR HEART PROBLEMS  . Hyperlipidemia   . Hypertension   . Shortness of breath    WITH EXERTION    Past Surgical History:  Procedure Laterality Date  . ABDOMINAL HYSTERECTOMY    . CESAREAN SECTION    . CHOLECYSTECTOMY N/A 12/09/2016   Procedure: LAPAROSCOPIC CHOLECYSTECTOMY WITH INTRAOPERATIVE CHOLANGIOGRAM;  Surgeon: Stark Klein, MD;  Location: [facility];  Service: General;  Laterality: N/A;  . CHOLECYSTECTOMY  12/09/2016  . COLONOSCOPY WITH PROPOFOL N/A 01/30/2013   Procedure: COLONOSCOPY WITH PROPOFOL;  Surgeon: Irene Shipper, MD;  Location: [facility];  Service: Endoscopy;  Laterality: N/A;  . ERCP N/A 12/08/2016   Procedure: ENDOSCOPIC RETROGRADE CHOLANGIOPANCREATOGRAPHY (ERCP);  Surgeon: Ladene Artist, MD;  Location: [facility];  Service: Endoscopy;  Laterality: N/A;  . ESOPHAGOGASTRODUODENOSCOPY (EGD) WITH PROPOFOL N/A 01/30/2013   Procedure: ESOPHAGOGASTRODUODENOSCOPY (EGD) WITH PROPOFOL;  Surgeon: Irene Shipper, MD;  Location: [facility];  Service: Endoscopy;  Laterality: N/A;  . Gallstone Surgeery  12/08/2016  . PARTIAL HYSTERECTOMY    . TOTAL KNEE ARTHROPLASTY Right 04/20/2014   Procedure: RIGHT TOTAL KNEE ARTHROPLASTY CONVERTED TO RIGHT KNEE REIMPLANTATION;  Surgeon: Mcarthur Rossetti, MD;  Location: [facility];  Service: Orthopedics;  Laterality: Right;  . TOTAL KNEE ARTHROPLASTY Left 08/23/2015   Procedure: LEFT TOTAL KNEE ARTHROPLASTY;  Surgeon: Mcarthur Rossetti, MD;  Location: [facility];  Service: Orthopedics;  Laterality: Left;  Marland Kitchen VENTRAL HERNIA REPAIR     x2    Family History  Problem Relation Age of Onset  . Diabetes Mother   . Hypertension Mother     entire family  . Stroke Father     CVA  . Hyperlipidemia Father     entire family  . Diabetes Father   . Heart disease Father   . Crohn's disease Son   . Irritable bowel syndrome Son     Social History   Social History  . Marital status: Married    Spouse name: N/A  . Number of children: 2  . Years of education: N/A   Occupational History  . housewife Unemployed   Social History Main Topics  . Smoking status: Never Smoker  . Smokeless tobacco: Never Used  . Alcohol use No  . Drug use: No  . Sexual activity: Yes  Partners: Male   Other Topics Concern  . Not on file   Social History Narrative   No exercise secondary to knee pain    Outpatient Medications Prior to Visit  Medication Sig Dispense Refill  . acetaminophen (TYLENOL) 325 MG tablet Follow package instructions, you can take 2 tablets every 4 hours as needed. Your prescribed pain medication also has Tylenol/acetaminophen in it. You cannot take more than 4000 mg per day. Count each prescribed pain medication as a Tylenol tablet also. 100 tablet 2  . allopurinol (ZYLOPRIM) 300 MG tablet TAKE 1 TABLET (300 MG TOTAL) BY MOUTH 2 (TWO) TIMES DAILY. 180 tablet 0  . calcium carbonate (TUMS - DOSED IN MG ELEMENTAL CALCIUM) 500 MG chewable tablet Chew 2  tablets by mouth 3 (three) times daily as needed for indigestion or heartburn.    . calcium-vitamin D (OSCAL WITH D) 500-200 MG-UNIT per tablet Take 1 tablet by mouth 2 (two) times daily.    Marland Kitchen DEXILANT 60 MG capsule TAKE 1 CAPSULE (60 MG TOTAL) BY MOUTH EVERY MORNING 90 capsule 3  . fenofibrate 160 MG tablet TAKE 1 TABLET (160 MG TOTAL) BY MOUTH DAILY. 90 tablet 0  . JANUMET XR [phone] MG TB24 TAKE 1 TABLET BY MOUTH EVERY DAY 90 tablet 1  . ONE TOUCH ULTRA TEST test strip ONETOUCH ULTRA BLUE TEST STRIPS CHECK BLOOD SUGAR DAILY. DX: E11.9 100 each 12  . ONETOUCH DELICA LANCETS FINE MISC Check glucose qd 100 each 12  . oxyCODONE-acetaminophen (PERCOCET/ROXICET) 5-325 MG tablet Take 1-2 tablets by mouth every 4 (four) hours as needed for moderate pain. 15 tablet 0  . rosuvastatin (CRESTOR) 20 MG tablet Take 1 tablet (20 mg total) by mouth daily. 90 tablet 1  . valsartan-hydrochlorothiazide (DIOVAN-HCT) 80-12.5 MG tablet Take 1 tablet by mouth daily. 90 tablet 0   No facility-administered medications prior to visit.     Allergies  Allergen Reactions  . Aspirin     Regular ASA-stomach upset  . Other     SOME BANDAIDS CAUSE SKIN IRRITATION  . Penicillins Itching    Has patient had a PCN reaction causing immediate rash, facial/tongue/throat swelling, SOB or lightheadedness with hypotension: No Has patient had a PCN reaction causing severe rash involving mucus membranes or skin necrosis: No Has patient had a PCN reaction that required hospitalization No Has patient had a PCN reaction occurring within the last 10 years: No If all of the above answers are "NO", then may proceed with Cephalosporin use.     Review of Systems  Constitutional: Positive for malaise/fatigue. Negative for chills and fever.  HENT: Negative for congestion and hearing loss.   Eyes: Negative for discharge.  Respiratory: Negative for cough, sputum production and shortness of breath.   Cardiovascular: Negative for chest  pain, palpitations and leg swelling.  Gastrointestinal: Positive for abdominal pain. Negative for blood in stool, constipation, diarrhea, heartburn, nausea and vomiting.  Genitourinary: Negative for dysuria, frequency, hematuria and urgency.  Musculoskeletal: Negative for back pain, falls and myalgias.  Skin: Negative for rash.  Neurological: Negative for dizziness, sensory change, loss of consciousness, weakness and headaches.  Endo/Heme/Allergies: Negative for environmental allergies. Does not bruise/bleed easily.  Psychiatric/Behavioral: Negative for depression and suicidal ideas. The patient is not nervous/anxious and does not have insomnia.        Objective:    Physical Exam  Constitutional: She is oriented to person, place, and time. She appears well-developed and well-nourished.  HENT:  Head: Normocephalic and atraumatic.  Eyes: Conjunctivae and EOM are normal.  Neck: Normal range of motion. Neck supple. No JVD present. Carotid bruit is not present. No thyromegaly present.  Cardiovascular: Normal rate, regular rhythm and normal heart sounds.   No murmur heard. Pulmonary/Chest: Effort normal and breath sounds normal. No respiratory distress. She has no wheezes. She has no rales. She exhibits no tenderness.  Abdominal: Soft. Bowel sounds are normal. She exhibits no distension. There is tenderness. There is no rebound and no guarding.  + surgical wounds from laprascopic surgery healing well No signs of infection  Musculoskeletal: She exhibits no edema.  Neurological: She is alert and oriented to person, place, and time.  Psychiatric: She has a normal mood and affect. Her behavior is normal. Judgment and thought content normal.  Nursing note and vitals reviewed.   BP 126/71 (BP Location: Right Wrist, Patient Position: Sitting, Cuff Size: Normal)   Pulse 70   Temp 98.5 F (36.9 C) (Oral)   Wt 273 lb 3.2 oz (123.9 kg)   SpO2 98% Comment: RA  BMI 44.10 kg/m  Wt Readings from  Last 3 Encounters:  12/18/16 273 lb 3.2 oz (123.9 kg)  12/07/16 272 lb 3.2 oz (123.5 kg)  02/13/16 271 lb 12.8 oz (123.3 kg)   BP Readings from Last 3 Encounters:  12/18/16 126/71  12/10/16 (!) 152/63  02/13/16 120/82     Immunization History  Administered Date(s) Administered  . Influenza Split 07/28/2011  . Influenza Whole 07/16/2008, 07/03/2009, 08/05/2010  . Influenza,inj,Quad PF,36+ Mos 05/26/2013  . Influenza-Unspecified 08/15/2015  . Pneumococcal Polysaccharide-23 08/20/2004, 05/26/2013  . Td 10/06/2001  . Tdap 04/10/2014    Health Maintenance  Topic Date Due  . OPHTHALMOLOGY EXAM  03/27/2015  . MAMMOGRAM  02/12/2017 (Originally 10/12/2013)  . FOOT EXAM  02/12/2017  . INFLUENZA VACCINE  04/14/2017  . HEMOGLOBIN A1C  06/19/2017  . COLONOSCOPY  01/31/2023  . TETANUS/TDAP  04/10/2024  . Hepatitis C Screening  Completed  . HIV Screening  Completed    Lab Results  Component Value Date   WBC 5.2 12/18/2016   HGB 9.8 (L) 12/18/2016   HCT 30.3 (L) 12/18/2016   PLT 72.0 (L) 12/18/2016   GLUCOSE 108 (H) 12/18/2016   CHOL 97 12/18/2016   TRIG 216.0 (H) 12/18/2016   HDL 29.80 (L) 12/18/2016   LDLDIRECT 37.0 12/18/2016   LDLCALC 52 11/20/2014   ALT 17 12/18/2016   AST 20 12/18/2016   NA 144 12/18/2016   K 2.8 (LL) 12/18/2016   CL 104 12/18/2016   CREATININE 0.99 12/18/2016   BUN 11 12/18/2016   CO2 30 12/18/2016   TSH 3.59 12/18/2016   INR 1.04 12/07/2016   HGBA1C 5.4 12/18/2016   MICROALBUR <0.7 11/20/2014    Lab Results  Component Value Date   TSH 3.59 12/18/2016   Lab Results  Component Value Date   WBC 5.2 12/18/2016   HGB 9.8 (L) 12/18/2016   HCT 30.3 (L) 12/18/2016   MCV 83.2 12/18/2016   PLT 72.0 (L) 12/18/2016   Lab Results  Component Value Date   NA 144 12/18/2016   K 2.8 (LL) 12/18/2016   CO2 30 12/18/2016   GLUCOSE 108 (H) 12/18/2016   BUN 11 12/18/2016   CREATININE 0.99 12/18/2016   BILITOT 0.7 12/18/2016   ALKPHOS 60 12/18/2016    AST 20 12/18/2016   ALT 17 12/18/2016   PROT 6.0 12/18/2016   ALBUMIN 3.8 12/18/2016   CALCIUM 9.1 12/18/2016   ANIONGAP 7 12/10/2016  GFR 59.92 (L) 12/18/2016   Lab Results  Component Value Date   CHOL 97 12/18/2016   Lab Results  Component Value Date   HDL 29.80 (L) 12/18/2016   Lab Results  Component Value Date   LDLCALC 52 11/20/2014   Lab Results  Component Value Date   TRIG 216.0 (H) 12/18/2016   Lab Results  Component Value Date   CHOLHDL 3 12/18/2016   Lab Results  Component Value Date   HGBA1C 5.4 12/18/2016         Assessment & Plan:   Problem List Items Addressed This Visit      Unprioritized   AKI (acute kidney injury) ([place])   Choledocholithiasis with obstruction    f/u GI and surgery Appointments in next 2 weeks      DM2 (diabetes mellitus, type 2) ([place]) (Chronic)    Well controlled, no changes to meds. Encouraged heart healthy diet such as the DASH diet and exercise as tolerated.       Relevant Orders   Hemoglobin A1c (Completed)   Hyperlipidemia LDL goal <70    Encouraged heart healthy diet, increase exercise, avoid trans fats, consider a krill oil cap daily       Other Visit Diagnoses    Anemia, unspecified type    -  Primary   Relevant Orders   CBC (Completed)   Hypertension, unspecified type       Relevant Orders   Comprehensive metabolic panel (Completed)   TSH (Completed)   Hyperlipidemia, unspecified hyperlipidemia type       Relevant Orders   Lipid panel (Completed)      I am having Ms. Whipkey maintain her calcium-vitamin D, calcium carbonate, ONETOUCH DELICA LANCETS FINE, DEXILANT, rosuvastatin, ONE TOUCH ULTRA TEST, JANUMET XR, allopurinol, fenofibrate, valsartan-hydrochlorothiazide, oxyCODONE-acetaminophen, and acetaminophen.  No orders of the defined types were placed in this encounter.   CMA served as Education administrator during this visit. History, Physical and Plan performed by medical provider. Documentation and orders  reviewed and attested to.  Ann Held, DO

## 2016-12-18 NOTE — Assessment & Plan Note (Signed)
Well controlled, no changes to meds. Encouraged heart healthy diet such as the DASH diet and exercise as tolerated.  °

## 2016-12-18 NOTE — Assessment & Plan Note (Signed)
f/u GI and surgery Appointments in next 2 weeks

## 2016-12-18 NOTE — Assessment & Plan Note (Signed)
Encouraged heart healthy diet, increase exercise, avoid trans fats, consider a krill oil cap daily 

## 2016-12-21 ENCOUNTER — Other Ambulatory Visit: Payer: Self-pay | Admitting: Family Medicine

## 2016-12-21 DIAGNOSIS — E876 Hypokalemia: Secondary | ICD-10-CM

## 2016-12-24 ENCOUNTER — Ambulatory Visit (INDEPENDENT_AMBULATORY_CARE_PROVIDER_SITE_OTHER): Payer: BLUE CROSS/BLUE SHIELD | Admitting: Nurse Practitioner

## 2016-12-24 ENCOUNTER — Encounter: Payer: Self-pay | Admitting: Nurse Practitioner

## 2016-12-24 ENCOUNTER — Other Ambulatory Visit (INDEPENDENT_AMBULATORY_CARE_PROVIDER_SITE_OTHER): Payer: BLUE CROSS/BLUE SHIELD

## 2016-12-24 VITALS — BP 130/70 | HR 74 | Ht 66.0 in | Wt 269.0 lb

## 2016-12-24 DIAGNOSIS — K317 Polyp of stomach and duodenum: Secondary | ICD-10-CM | POA: Diagnosis not present

## 2016-12-24 DIAGNOSIS — E876 Hypokalemia: Secondary | ICD-10-CM | POA: Diagnosis not present

## 2016-12-24 DIAGNOSIS — K219 Gastro-esophageal reflux disease without esophagitis: Secondary | ICD-10-CM

## 2016-12-24 MED ORDER — RANITIDINE HCL 150 MG PO TABS: 150.0000 mg | ORAL_TABLET | Freq: Every day | ORAL | 3 refills | Status: DC

## 2016-12-24 NOTE — Patient Instructions (Signed)
If you are age 65 or older, your body mass index should be between 23-30. Your Body mass index is 43.42 kg/m. If this is out of the aforementioned range listed, please consider follow up with your Primary Care Provider.  If you are age 38 or younger, your body mass index should be between 19-25. Your Body mass index is 43.42 kg/m. If this is out of the aformentioned range listed, please consider follow up with your Primary Care Provider.   You have been scheduled for an endoscopy. Please follow written instructions given to you at your visit today. If you use inhalers (even only as needed), please bring them with you on the day of your procedure. Your physician has requested that you go to www.startemmi.com and enter the access code given to you at your visit today. This web site gives a general overview about your procedure. However, you should still follow specific instructions given to you by our office regarding your preparation for the procedure.  We have sent the following medications to your pharmacy for you to pick up at your convenience: Zantac 150 mg  You have been given GERD literature.  Thank you for choosing me and [facility].  Tye Savoy, NP

## 2016-12-24 NOTE — Progress Notes (Signed)
     HPI: Patient is a 66 year old female known to Dr. Henrene Pastor for a history of adenomatous colon polyps. Patient was hospitalized late March for upper abdominal pain, cholelithiasis and elevated LFTs. CT scan showed intra-and extrahepatic biliary duct dilation with CBD stone. She underwent ERCP with sphincterotomy and stone extraction.. Following that she had a laparoscopic cholecystectomy. Her LFTs steadily improved and as of 12/18/16 they were back to normal . During the ERCP multiple erythematous gastric polyps were found and they had increased in size since EGD in 2014.    Patient is here for a hospital follow-up. Her appetite has not bounced back, no significant abdominal pain or nausea. She does complain of daily heartburn. PPI was recently changed to Annabella but it hasn't made much difference. Patient requires Tums on a regular basis. The heartburn might be worse at night but certainly occurs randomly throughout the day. Patient sleeps on pillows. It sounds like she does eat a generous amount of chocolate. She is unable to lose weight because of inability to exercise. Marland Kitchen  Past Medical History:  Diagnosis Date  . Arthritis    PAIN AND OA BOTH KNEES AND SHOULDERS AND ELBOWS AND WRIST  . Diabetes mellitus    ORAL MEDICATION  . GERD (gastroesophageal reflux disease)   . Gout    NO RECENT FLARE UPS  . H/O: rheumatic fever    AS A CHILD - NO KNOWN HEART MURMUR OR HEART PROBLEMS  . Hyperlipidemia   . Hypertension   . Shortness of breath    WITH EXERTION    Patient's surgical history, family medical history, social history, medications and allergies were all reviewed in Epic    Physical Exam: Ht 5' 6"  (1.676 m)   Wt 269 lb (122 kg)   BMI 43.42 kg/m   GENERAL: obese white female in NAD PSYCH: :Pleasant, cooperative, normal affect EENT:  conjunctiva pink, mucous membranes moist, neck supple without masses CARDIAC:  RRR, no murmur heard, 2+ BLE edema PULM: Normal respiratory effort,  lungs CTA bilaterally, no wheezing ABDOMEN: limited exam, in a wheelchair. Abdomen is obese, soft, nontender,,  normal bowel sounds.  SKIN:  turgor, no lesions seen Musculoskeletal:  Normal muscle tone, normal strength NEURO: Alert and oriented x 3, no focal neurologic deficits   ASSESSMENT and PLAN:  46. 66 year old female recently hospitalized with cholelithiasis and choledocholithiasis She is s/p ERCP with stone extraction and sphincterotomy followed by laparoscopic cholecystectomy. She is feeling much better, LFTs have normalized.  2. Gastric polyps, seen during ERCP. The polyps were erythematous and had enlarged since last EGD in 2014. Polyps need to be further evaluated by EGD. The risks and benefits of EGD were discussed and the patient agrees to proceed.   3. Chronic thrombocytopenia / mild splenomegaly and hepatomegaly on imaging. Wonder about NASH with early crrhosis. We can look for evidence of portal HTN at time of EGD  4. Normocytic anemia. Baseline hgb 10-11, down to 7.4 during recent admission. Decline in hgb may have been dilutional from IVF. Hgb now up to 8.8. in may be due in part to diluation from IVF received during hospitalization. Hgb is stable at 9.8   Tye Savoy , NP 12/24/2016, 1:37 PM

## 2016-12-25 ENCOUNTER — Telehealth: Payer: Self-pay | Admitting: *Deleted

## 2016-12-25 LAB — BASIC METABOLIC PANEL
BUN: 13 mg/dL (ref 6–23)
CALCIUM: 9.5 mg/dL (ref 8.4–10.5)
CO2: 29 mEq/L (ref 19–32)
CREATININE: 1.01 mg/dL (ref 0.40–1.20)
Chloride: 103 mEq/L (ref 96–112)
GFR: 58.55 mL/min — AB (ref >60.00)
Glucose, Bld: 106 mg/dL — ABNORMAL HIGH (ref 70–99)
Potassium: 3.7 mEq/L (ref 3.5–5.1)
Sodium: 141 mEq/L (ref 135–145)

## 2016-12-25 NOTE — Telephone Encounter (Signed)
Received fax from [facility] for the shingles vaccine and it is covered for patient.  Advised patient that she can come do a nurse visit.  She will wait and call back for an appointment because she is dealing with other health issues now.

## 2016-12-29 NOTE — Progress Notes (Signed)
Office evaluation reviewed. Recent hospitalization for choledocholithiasis successfully treated. May have fundic gland or inflammatory gastric polyps. May contribute to anemia. Question of NASH cirrhosis with portal hypertension raised. EGD arranged

## 2017-01-11 ENCOUNTER — Other Ambulatory Visit: Payer: Self-pay | Admitting: Family Medicine

## 2017-01-11 DIAGNOSIS — E785 Hyperlipidemia, unspecified: Secondary | ICD-10-CM

## 2017-01-12 ENCOUNTER — Other Ambulatory Visit: Payer: Self-pay | Admitting: Family Medicine

## 2017-01-12 DIAGNOSIS — M1000 Idiopathic gout, unspecified site: Secondary | ICD-10-CM

## 2017-01-18 ENCOUNTER — Other Ambulatory Visit: Payer: Self-pay | Admitting: Family Medicine

## 2017-01-18 MED ORDER — POTASSIUM CHLORIDE CRYS ER 20 MEQ PO TBCR: 20.0000 meq | EXTENDED_RELEASE_TABLET | Freq: Every day | ORAL | 1 refills | Status: DC

## 2017-01-20 ENCOUNTER — Other Ambulatory Visit: Payer: Self-pay | Admitting: Family Medicine

## 2017-01-26 ENCOUNTER — Other Ambulatory Visit: Payer: Self-pay | Admitting: Family Medicine

## 2017-01-26 DIAGNOSIS — I10 Essential (primary) hypertension: Secondary | ICD-10-CM

## 2017-02-04 ENCOUNTER — Encounter: Payer: Self-pay | Admitting: Internal Medicine

## 2017-02-16 ENCOUNTER — Other Ambulatory Visit (INDEPENDENT_AMBULATORY_CARE_PROVIDER_SITE_OTHER): Payer: BLUE CROSS/BLUE SHIELD

## 2017-02-16 ENCOUNTER — Other Ambulatory Visit: Payer: Self-pay

## 2017-02-16 ENCOUNTER — Ambulatory Visit (AMBULATORY_SURGERY_CENTER): Payer: BLUE CROSS/BLUE SHIELD | Admitting: Internal Medicine

## 2017-02-16 ENCOUNTER — Encounter: Payer: Self-pay | Admitting: Internal Medicine

## 2017-02-16 VITALS — BP 125/91 | HR 59 | Temp 97.1°F | Resp 20 | Ht 60.0 in | Wt 269.0 lb

## 2017-02-16 DIAGNOSIS — K317 Polyp of stomach and duodenum: Secondary | ICD-10-CM | POA: Diagnosis not present

## 2017-02-16 DIAGNOSIS — D509 Iron deficiency anemia, unspecified: Secondary | ICD-10-CM | POA: Diagnosis not present

## 2017-02-16 DIAGNOSIS — D649 Anemia, unspecified: Secondary | ICD-10-CM | POA: Diagnosis not present

## 2017-02-16 LAB — CBC WITH DIFFERENTIAL/PLATELET
BASOS PCT: 0.3 % (ref 0.0–3.0)
Basophils Absolute: 0 10*3/uL (ref 0.0–0.1)
EOS PCT: 0 % (ref 0.0–5.0)
Eosinophils Absolute: 0 10*3/uL (ref 0.0–0.7)
HCT: 29.8 % — ABNORMAL LOW (ref 36.0–46.0)
HEMOGLOBIN: 9.9 g/dL — AB (ref 12.0–15.0)
LYMPHS ABS: 1.4 10*3/uL (ref 0.7–4.0)
Lymphocytes Relative: 26.9 % (ref 12.0–46.0)
MCHC: 33.2 g/dL (ref 30.0–36.0)
MCV: 83.4 fl (ref 78.0–100.0)
MONOS PCT: 4.3 % (ref 3.0–12.0)
Monocytes Absolute: 0.2 10*3/uL (ref 0.1–1.0)
Neutro Abs: 3.5 10*3/uL (ref 1.4–7.7)
Neutrophils Relative %: 68.5 % (ref 43.0–77.0)
Platelets: 74 10*3/uL — ABNORMAL LOW (ref 150.0–400.0)
RBC: 3.58 Mil/uL — AB (ref 3.87–5.11)
RDW: 16.7 % — AB (ref 11.5–15.5)
WBC: 5.2 10*3/uL (ref 4.0–10.5)

## 2017-02-16 MED ORDER — SODIUM CHLORIDE 0.9 % IV SOLN: 500.0000 mL | INTRAVENOUS | Status: DC

## 2017-02-16 MED ORDER — FERROUS SULFATE 325 (65 FE) MG PO TBEC: 325.0000 mg | DELAYED_RELEASE_TABLET | Freq: Two times a day (BID) | ORAL | 11 refills | Status: DC

## 2017-02-16 NOTE — Progress Notes (Signed)
Report to PACU, RN, vss, BBS= Clear.  

## 2017-02-16 NOTE — Progress Notes (Signed)
Called to room to assist during endoscopic procedure.  Patient ID and intended procedure confirmed with present staff. Received instructions for my participation in the procedure from the performing physician.  

## 2017-02-16 NOTE — Op Note (Signed)
[place] Patient Name: Rachel Black Procedure Date: 02/16/2017 10:21 AM MRN: [id number] Endoscopist: Docia Chuck. Henrene Pastor , MD Age: 66 Referring MD:  Date of Birth: 12-Aug-1951 Gender: Female Account #: [id number] Procedure:                Upper GI endoscopy, with polypectomy and control of                            bleeding (Endo Clip X2) Indications:              Iron deficiency anemia, Benign gastric tumor,                            Portal hypertension rule out esophageal varices Medicines:                Monitored Anesthesia Care Procedure:                Pre-Anesthesia Assessment:                           - Prior to the procedure, a History and Physical                            was performed, and patient medications and                            allergies were reviewed. The patient's tolerance of                            previous anesthesia was also reviewed. The risks                            and benefits of the procedure and the sedation                            options and risks were discussed with the patient.                            All questions were answered, and informed consent                            was obtained. Prior Anticoagulants: The patient has                            taken no previous anticoagulant or antiplatelet                            agents. ASA Grade Assessment: III - A patient with                            severe systemic disease. After reviewing the risks                            and benefits, the patient was deemed in  satisfactory condition to undergo the procedure.                           After obtaining informed consent, the endoscope was                            passed under direct vision. Throughout the                            procedure, the patient's blood pressure, pulse, and                            oxygen saturations were monitored continuously. The   Endoscope was introduced through the mouth, and                            advanced to the second part of duodenum. The upper                            GI endoscopy was accomplished without difficulty.                            The patient tolerated the procedure well. Scope In: Scope Out: Findings:                 The examined esophagus was normal.                           Multiple 5 to 20 mm pedunculated inflammatory                            appearing polyps were found in the entire examined                            stomach. One group of polyps in the posterior body                            measuring approximately 15 mm demonstrated moderate                            spontaneous hemorrhage upon entering the stomach..                            The polyp group was removed with a hot snare.                            Resection and retrieval were complete. Verification                            of patient identification for the specimen was                            done. Hemostasis achieved. To prevent delayed  bleeding after the polypectomy, two hemostatic                            clips were successfully placed. There was no                            bleeding at the end of the procedure.                           A hiatal hernia was present.                           The exam of the stomach was otherwise normal.                           The examined duodenum was normal.                           The cardia and gastric fundus revealed no                            additional abnormalities on retroflexion. Complications:            No immediate complications. Estimated Blood Loss:     Estimated blood loss: none. Impression:               - Normal esophagus.                           - Multiple gastric polyps. Large spontaneously                            bleeding polyp Resected and retrieved. Clips were                            placed  prophylactically.                           - Hiatal hernia.                           - Normal examined duodenum. Recommendation:           1. Prescribed iron sulfate 325 mg; #60; take 1 by                            mouth twice daily; 11 refills                           2. CBC today                           3. Patient has a contact number available for                            emergencies. The signs and symptoms of potential  delayed complications were discussed with the                            patient. Return to normal activities tomorrow.                            Written discharge instructions were provided to the                            patient.                           4.Resume previous diet.                           5. Continue present medications.                           6.Await pathology results.                           7. Repeat CBC in 4 weeks                           8. Follow-up with Dr. Henrene Pastor in the office in 6                            weeks. May require multiple polypectomies if anemia                            persists despite iron supplementation and/or acute                            or subacute polyp related bleeding develops [name], MD 02/16/2017 11:01:27 AM This report has been signed electronically.

## 2017-02-16 NOTE — Patient Instructions (Signed)
YOU HAD AN ENDOSCOPIC PROCEDURE TODAY: Refer to the procedure report and other information in the discharge instructions given to you for any specific questions about what was found during the examination. If this information does not answer your questions, please call [place] office at [phone] to clarify.   YOU SHOULD EXPECT: Some feelings of bloating in the abdomen. Passage of more gas than usual. Walking can help get rid of the air that was put into your GI tract during the procedure and reduce the bloating. If you had a lower endoscopy (such as a colonoscopy or flexible sigmoidoscopy) you may notice spotting of blood in your stool or on the toilet paper. Some abdominal soreness may be present for a day or two, also.  DIET: Your first meal following the procedure should be a light meal and then it is ok to progress to your normal diet. A half-sandwich or bowl of soup is an example of a good first meal. Heavy or fried foods are harder to digest and may make you feel nauseous or bloated. Drink plenty of fluids but you should avoid alcoholic beverages for 24 hours. If you had a esophageal dilation, please see attached instructions for diet.    ACTIVITY: Your care partner should take you home directly after the procedure. You should plan to take it easy, moving slowly for the rest of the day. You can resume normal activity the day after the procedure however YOU SHOULD NOT DRIVE, use power tools, machinery or perform tasks that involve climbing or major physical exertion for 24 hours (because of the sedation medicines used during the test).   SYMPTOMS TO REPORT IMMEDIATELY: A gastroenterologist can be reached at any hour. Please call [phone]  for any of the following symptoms:   Following upper endoscopy (EGD, EUS, ERCP, esophageal dilation) Vomiting of blood or coffee ground material  New, significant abdominal pain  New, significant chest pain or pain under the shoulder blades  Painful or  persistently difficult swallowing  New shortness of breath  Black, tarry-looking or red, bloody stools  FOLLOW UP:  If any biopsies were taken you will be contacted by phone or by letter within the next 1-3 weeks. Call [phone]  if you have not heard about the biopsies in 3 weeks.  Please also call with any specific questions about appointments or follow up tests.  

## 2017-02-16 NOTE — Progress Notes (Signed)
Pt scheduled to see Dr. Henrene Pastor 03/31/17@2 :15pm, order in epic for CBC in 4 weeks.

## 2017-02-17 ENCOUNTER — Telehealth: Payer: Self-pay | Admitting: *Deleted

## 2017-02-17 NOTE — Telephone Encounter (Signed)
  Follow up Call-  Call back number 02/16/2017  Post procedure Call Back phone  # [phone]  Permission to leave phone message Yes  Some recent data might be hidden     Patient questions:  Do you have a fever, pain , or abdominal swelling? No. Pain Score  0 *  Have you tolerated food without any problems? Yes.    Have you been able to return to your normal activities? Yes.    Do you have any questions about your discharge instructions: Diet   No. Medications  No. Follow up visit  No.  Do you have questions or concerns about your Care? No.  Actions: * If pain score is 4 or above: No action needed, pain <4.

## 2017-02-18 ENCOUNTER — Other Ambulatory Visit: Payer: Self-pay | Admitting: Nurse Practitioner

## 2017-02-22 ENCOUNTER — Encounter: Payer: Self-pay | Admitting: Internal Medicine

## 2017-03-02 ENCOUNTER — Other Ambulatory Visit: Payer: Self-pay | Admitting: Family Medicine

## 2017-03-02 DIAGNOSIS — E1151 Type 2 diabetes mellitus with diabetic peripheral angiopathy without gangrene: Secondary | ICD-10-CM

## 2017-03-02 DIAGNOSIS — E1165 Type 2 diabetes mellitus with hyperglycemia: Principal | ICD-10-CM

## 2017-03-02 DIAGNOSIS — IMO0002 Reserved for concepts with insufficient information to code with codable children: Secondary | ICD-10-CM

## 2017-03-05 ENCOUNTER — Other Ambulatory Visit: Payer: Self-pay | Admitting: Family Medicine

## 2017-03-05 DIAGNOSIS — E1165 Type 2 diabetes mellitus with hyperglycemia: Principal | ICD-10-CM

## 2017-03-05 DIAGNOSIS — IMO0002 Reserved for concepts with insufficient information to code with codable children: Secondary | ICD-10-CM

## 2017-03-05 DIAGNOSIS — E1151 Type 2 diabetes mellitus with diabetic peripheral angiopathy without gangrene: Secondary | ICD-10-CM

## 2017-03-14 ENCOUNTER — Encounter (HOSPITAL_COMMUNITY): Payer: Self-pay

## 2017-03-14 ENCOUNTER — Emergency Department (HOSPITAL_COMMUNITY): Payer: BLUE CROSS/BLUE SHIELD

## 2017-03-14 ENCOUNTER — Observation Stay (HOSPITAL_COMMUNITY): Payer: BLUE CROSS/BLUE SHIELD

## 2017-03-14 ENCOUNTER — Inpatient Hospital Stay (HOSPITAL_COMMUNITY)
Admission: EM | Admit: 2017-03-14 | Discharge: 2017-03-18 | DRG: 041 | Disposition: A | Discharge status: Home / Self Care | Payer: BLUE CROSS/BLUE SHIELD | Attending: Internal Medicine | Admitting: Internal Medicine

## 2017-03-14 DIAGNOSIS — Z96653 Presence of artificial knee joint, bilateral: Secondary | ICD-10-CM | POA: Diagnosis present

## 2017-03-14 DIAGNOSIS — N179 Acute kidney failure, unspecified: Secondary | ICD-10-CM | POA: Diagnosis not present

## 2017-03-14 DIAGNOSIS — E119 Type 2 diabetes mellitus without complications: Secondary | ICD-10-CM

## 2017-03-14 DIAGNOSIS — Z8673 Personal history of transient ischemic attack (TIA), and cerebral infarction without residual deficits: Secondary | ICD-10-CM

## 2017-03-14 DIAGNOSIS — I639 Cerebral infarction, unspecified: Secondary | ICD-10-CM

## 2017-03-14 DIAGNOSIS — Q2112 Patent foramen ovale: Secondary | ICD-10-CM

## 2017-03-14 DIAGNOSIS — E1169 Type 2 diabetes mellitus with other specified complication: Secondary | ICD-10-CM

## 2017-03-14 DIAGNOSIS — Z79899 Other long term (current) drug therapy: Secondary | ICD-10-CM

## 2017-03-14 DIAGNOSIS — Z7982 Long term (current) use of aspirin: Secondary | ICD-10-CM

## 2017-03-14 DIAGNOSIS — R29700 NIHSS score 0: Secondary | ICD-10-CM | POA: Diagnosis present

## 2017-03-14 DIAGNOSIS — Q211 Atrial septal defect: Secondary | ICD-10-CM

## 2017-03-14 DIAGNOSIS — K219 Gastro-esophageal reflux disease without esophagitis: Secondary | ICD-10-CM | POA: Diagnosis present

## 2017-03-14 DIAGNOSIS — E1165 Type 2 diabetes mellitus with hyperglycemia: Secondary | ICD-10-CM | POA: Diagnosis present

## 2017-03-14 DIAGNOSIS — I63411 Cerebral infarction due to embolism of right middle cerebral artery: Secondary | ICD-10-CM | POA: Diagnosis not present

## 2017-03-14 DIAGNOSIS — E785 Hyperlipidemia, unspecified: Secondary | ICD-10-CM | POA: Diagnosis not present

## 2017-03-14 DIAGNOSIS — E782 Mixed hyperlipidemia: Secondary | ICD-10-CM | POA: Diagnosis present

## 2017-03-14 DIAGNOSIS — Z880 Allergy status to penicillin: Secondary | ICD-10-CM

## 2017-03-14 DIAGNOSIS — E876 Hypokalemia: Secondary | ICD-10-CM | POA: Diagnosis present

## 2017-03-14 DIAGNOSIS — I10 Essential (primary) hypertension: Secondary | ICD-10-CM | POA: Diagnosis not present

## 2017-03-14 DIAGNOSIS — I152 Hypertension secondary to endocrine disorders: Secondary | ICD-10-CM | POA: Diagnosis present

## 2017-03-14 DIAGNOSIS — I63511 Cerebral infarction due to unspecified occlusion or stenosis of right middle cerebral artery: Secondary | ICD-10-CM

## 2017-03-14 DIAGNOSIS — E1159 Type 2 diabetes mellitus with other circulatory complications: Secondary | ICD-10-CM | POA: Diagnosis present

## 2017-03-14 DIAGNOSIS — N289 Disorder of kidney and ureter, unspecified: Secondary | ICD-10-CM | POA: Diagnosis not present

## 2017-03-14 DIAGNOSIS — Z6841 Body Mass Index (BMI) 40.0 and over, adult: Secondary | ICD-10-CM

## 2017-03-14 HISTORY — DX: Cerebral infarction, unspecified: I63.9

## 2017-03-14 LAB — COMPREHENSIVE METABOLIC PANEL
ALBUMIN: 3.9 g/dL (ref 3.5–5.0)
ALT: 20 U/L (ref 14–54)
ANION GAP: 11 (ref 5–15)
AST: 31 U/L (ref 15–41)
Alkaline Phosphatase: 32 U/L — ABNORMAL LOW (ref 38–126)
BUN: 29 mg/dL — ABNORMAL HIGH (ref 6–20)
CHLORIDE: 107 mmol/L (ref 101–111)
CO2: 23 mmol/L (ref 22–32)
Calcium: 10.2 mg/dL (ref 8.9–10.3)
Creatinine, Ser: 1.35 mg/dL — ABNORMAL HIGH (ref 0.44–1.00)
GFR calc Af Amer: 47 mL/min — ABNORMAL LOW (ref >60)
GFR calc non Af Amer: 41 mL/min — ABNORMAL LOW (ref >60)
Glucose, Bld: 131 mg/dL — ABNORMAL HIGH (ref 65–99)
POTASSIUM: 4.1 mmol/L (ref 3.5–5.1)
SODIUM: 141 mmol/L (ref 135–145)
Total Bilirubin: 0.4 mg/dL (ref 0.3–1.2)
Total Protein: 6.4 g/dL — ABNORMAL LOW (ref 6.5–8.1)

## 2017-03-14 LAB — CBC
HCT: 34.7 % — ABNORMAL LOW (ref 36.0–46.0)
Hemoglobin: 11.2 g/dL — ABNORMAL LOW (ref 12.0–15.0)
MCH: 28.5 pg (ref 26.0–34.0)
MCHC: 32.3 g/dL (ref 30.0–36.0)
MCV: 88.3 fL (ref 78.0–100.0)
PLATELETS: 99 10*3/uL — AB (ref 150–400)
RBC: 3.93 MIL/uL (ref 3.87–5.11)
RDW: 17.1 % — ABNORMAL HIGH (ref 11.5–15.5)
WBC: 5.3 10*3/uL (ref 4.0–10.5)

## 2017-03-14 LAB — I-STAT CHEM 8, ED
BUN: 28 mg/dL — ABNORMAL HIGH (ref 6–20)
Calcium, Ion: 1.26 mmol/L (ref 1.15–1.40)
Chloride: 107 mmol/L (ref 101–111)
Creatinine, Ser: 1.4 mg/dL — ABNORMAL HIGH (ref 0.44–1.00)
Glucose, Bld: 126 mg/dL — ABNORMAL HIGH (ref 65–99)
HCT: 34 % — ABNORMAL LOW (ref 36.0–46.0)
Hemoglobin: 11.6 g/dL — ABNORMAL LOW (ref 12.0–15.0)
Potassium: 4 mmol/L (ref 3.5–5.1)
Sodium: 139 mmol/L (ref 135–145)
TCO2: 21 mmol/L (ref 0–100)

## 2017-03-14 LAB — DIFFERENTIAL
Basophils Absolute: 0 K/uL (ref 0.0–0.1)
Basophils Relative: 0 %
Eosinophils Absolute: 0 K/uL (ref 0.0–0.7)
Eosinophils Relative: 0 %
Lymphocytes Relative: 26 %
Lymphs Abs: 1.4 K/uL (ref 0.7–4.0)
Monocytes Absolute: 0.3 K/uL (ref 0.1–1.0)
Monocytes Relative: 7 %
Neutro Abs: 3.5 K/uL (ref 1.7–7.7)
Neutrophils Relative %: 67 %

## 2017-03-14 LAB — APTT: APTT: 26 s (ref 24–36)

## 2017-03-14 LAB — PROTIME-INR
INR: 0.96
PROTHROMBIN TIME: 12.8 s (ref 11.4–15.2)

## 2017-03-14 LAB — I-STAT TROPONIN, ED: Troponin i, poc: 0.01 ng/mL (ref 0.00–0.08)

## 2017-03-14 MED ORDER — ACETAMINOPHEN 325 MG PO TABS: 650.0000 mg | ORAL_TABLET | ORAL | Status: DC | PRN

## 2017-03-14 MED ORDER — SENNOSIDES-DOCUSATE SODIUM 8.6-50 MG PO TABS: 1.0000 | ORAL_TABLET | Freq: Every evening | ORAL | Status: DC | PRN

## 2017-03-14 MED ORDER — HYDROCHLOROTHIAZIDE 12.5 MG PO CAPS
12.5000 mg | ORAL_CAPSULE | Freq: Every day | ORAL | Status: DC
  Administered 2017-03-14: 12.5 mg via ORAL
  Filled 2017-03-14: qty 1

## 2017-03-14 MED ORDER — ASPIRIN 325 MG PO TABS
325.0000 mg | ORAL_TABLET | Freq: Every day | ORAL | Status: DC
  Administered 2017-03-15 – 2017-03-18 (×4): 325 mg via ORAL
  Filled 2017-03-14 (×5): qty 1

## 2017-03-14 MED ORDER — SODIUM CHLORIDE 0.9 % IV SOLN
INTRAVENOUS | Status: DC
  Administered 2017-03-14: 16:00:00 via INTRAVENOUS

## 2017-03-14 MED ORDER — IRBESARTAN 150 MG PO TABS
75.0000 mg | ORAL_TABLET | Freq: Every day | ORAL | Status: DC
  Administered 2017-03-14: 75 mg via ORAL
  Filled 2017-03-14: qty 1

## 2017-03-14 MED ORDER — ALLOPURINOL 100 MG PO TABS
300.0000 mg | ORAL_TABLET | Freq: Two times a day (BID) | ORAL | Status: DC
  Administered 2017-03-14 – 2017-03-18 (×9): 300 mg via ORAL
  Filled 2017-03-14: qty 1
  Filled 2017-03-14 (×4): qty 3
  Filled 2017-03-14 (×2): qty 1
  Filled 2017-03-14 (×3): qty 3
  Filled 2017-03-14: qty 1
  Filled 2017-03-14: qty 3
  Filled 2017-03-14: qty 1
  Filled 2017-03-14: qty 3

## 2017-03-14 MED ORDER — VALSARTAN-HYDROCHLOROTHIAZIDE 80-12.5 MG PO TABS: 1.0000 | ORAL_TABLET | Freq: Every day | ORAL | Status: DC

## 2017-03-14 MED ORDER — ACETAMINOPHEN 650 MG RE SUPP: 650.0000 mg | RECTAL | Status: DC | PRN

## 2017-03-14 MED ORDER — FENOFIBRATE 160 MG PO TABS
160.0000 mg | ORAL_TABLET | Freq: Every day | ORAL | Status: DC
  Administered 2017-03-14 – 2017-03-17 (×4): 160 mg via ORAL
  Filled 2017-03-14 (×5): qty 1

## 2017-03-14 MED ORDER — ASPIRIN 325 MG PO TABS
325.0000 mg | ORAL_TABLET | Freq: Once | ORAL | Status: AC
  Administered 2017-03-14: 325 mg via ORAL
  Filled 2017-03-14: qty 1

## 2017-03-14 MED ORDER — ROSUVASTATIN CALCIUM 20 MG PO TABS
20.0000 mg | ORAL_TABLET | Freq: Every day | ORAL | Status: DC
  Administered 2017-03-14 – 2017-03-18 (×5): 20 mg via ORAL
  Filled 2017-03-14 (×5): qty 1

## 2017-03-14 MED ORDER — FERROUS SULFATE 325 (65 FE) MG PO TABS
325.0000 mg | ORAL_TABLET | Freq: Two times a day (BID) | ORAL | Status: DC
  Administered 2017-03-14 – 2017-03-18 (×8): 325 mg via ORAL
  Filled 2017-03-14 (×10): qty 1

## 2017-03-14 MED ORDER — ACETAMINOPHEN 160 MG/5ML PO SOLN: 650.0000 mg | ORAL | Status: DC | PRN

## 2017-03-14 MED ORDER — PANTOPRAZOLE SODIUM 40 MG PO TBEC
40.0000 mg | DELAYED_RELEASE_TABLET | Freq: Every day | ORAL | Status: DC
  Administered 2017-03-14 – 2017-03-18 (×5): 40 mg via ORAL
  Filled 2017-03-14 (×5): qty 1

## 2017-03-14 MED ORDER — FAMOTIDINE 20 MG PO TABS
20.0000 mg | ORAL_TABLET | Freq: Every day | ORAL | Status: DC
  Administered 2017-03-14 – 2017-03-17 (×4): 20 mg via ORAL
  Filled 2017-03-14 (×4): qty 1

## 2017-03-14 MED ORDER — ASPIRIN 300 MG RE SUPP: 300.0000 mg | Freq: Every day | RECTAL | Status: DC

## 2017-03-14 MED ORDER — STROKE: EARLY STAGES OF RECOVERY BOOK
Freq: Once | Status: AC
  Administered 2017-03-14: 14:00:00

## 2017-03-14 NOTE — ED Notes (Signed)
Call report to [facility] but nurse busy at this time will call me back for report.

## 2017-03-14 NOTE — ED Provider Notes (Signed)
[facility] DEPT Provider Note   CSN: [id number] Arrival date & time: 03/14/17  0540     History   Chief Complaint Chief Complaint  Patient presents with  . Numbness    HPI Rachel Black is a 66 y.o. female.  The history is provided by the patient and the spouse.  Weakness  Primary symptoms include focal weakness, loss of sensation, speech change. The current episode started 6 to 12 hours ago. The problem has been gradually improving. There was left facial and left upper extremity focality noted. There has been no fever. Pertinent negatives include no shortness of breath, no chest pain, no vomiting and no headaches.  pt reports at 2130 last night she had difficulty gripping with left hand and felt numbness in left UE Husband reports left facial droop and speech difficulty There has been some improvement in symptoms  No trauma No h/o stroke  Past Medical History:  Diagnosis Date  . Arthritis    PAIN AND OA BOTH KNEES AND SHOULDERS AND ELBOWS AND WRIST  . Blood transfusion without reported diagnosis 2018   after cholecystectomy  . Diabetes mellitus    ORAL MEDICATION  . GERD (gastroesophageal reflux disease)   . Gout    NO RECENT FLARE UPS  . H/O: rheumatic fever    AS A CHILD - NO KNOWN HEART MURMUR OR HEART PROBLEMS  . Hyperlipidemia   . Hypertension   . Shortness of breath    WITH EXERTION    Patient Active Problem List   Diagnosis Date Noted  . Dilation of biliary tract   . Elevated LFTs   . RUQ abdominal pain   . Acute gallstone pancreatitis 12/07/2016  . Choledocholithiasis with obstruction 12/07/2016  . AKI (acute kidney injury) ([place]) 12/07/2016  . Osteoarthritis of right knee 08/23/2015  . Status post total left knee replacement 08/23/2015  . Arthritis of knee, right 04/20/2014  . Morbid obesity ([place]) 04/20/2014  . Status post total right knee replacement 04/20/2014  . Flushing reaction 03/10/2012  . DM2 (diabetes mellitus, type 2) ([place])  02/17/2007  . Hyperlipidemia LDL goal <70 02/17/2007  . GOUT 02/17/2007  . Severe obesity (BMI >= 40) ([place]) 02/17/2007  . Essential hypertension 02/17/2007  . RHEUMATIC FEVER, HX OF 02/17/2007    Past Surgical History:  Procedure Laterality Date  . ABDOMINAL HYSTERECTOMY    . CESAREAN SECTION    . CHOLECYSTECTOMY N/A 12/09/2016   Procedure: LAPAROSCOPIC CHOLECYSTECTOMY WITH INTRAOPERATIVE CHOLANGIOGRAM;  Surgeon: Stark Klein, MD;  Location: [facility];  Service: General;  Laterality: N/A;  . CHOLECYSTECTOMY  12/09/2016  . COLONOSCOPY WITH PROPOFOL N/A 01/30/2013   Procedure: COLONOSCOPY WITH PROPOFOL;  Surgeon: Irene Shipper, MD;  Location: [facility];  Service: Endoscopy;  Laterality: N/A;  . ERCP N/A 12/08/2016   Procedure: ENDOSCOPIC RETROGRADE CHOLANGIOPANCREATOGRAPHY (ERCP);  Surgeon: Ladene Artist, MD;  Location: [facility];  Service: Endoscopy;  Laterality: N/A;  . ESOPHAGOGASTRODUODENOSCOPY (EGD) WITH PROPOFOL N/A 01/30/2013   Procedure: ESOPHAGOGASTRODUODENOSCOPY (EGD) WITH PROPOFOL;  Surgeon: Irene Shipper, MD;  Location: [facility];  Service: Endoscopy;  Laterality: N/A;  . Gallstone Surgeery  12/08/2016  . PARTIAL HYSTERECTOMY    . TOTAL KNEE ARTHROPLASTY Right 04/20/2014   Procedure: RIGHT TOTAL KNEE ARTHROPLASTY CONVERTED TO RIGHT KNEE REIMPLANTATION;  Surgeon: Mcarthur Rossetti, MD;  Location: [facility];  Service: Orthopedics;  Laterality: Right;  . TOTAL KNEE ARTHROPLASTY Left 08/23/2015   Procedure: LEFT TOTAL KNEE ARTHROPLASTY;  Surgeon: Mcarthur Rossetti, MD;  Location: [facility];  Service: Orthopedics;  Laterality: Left;  Marland Kitchen VENTRAL HERNIA REPAIR     x2    OB History    No data available       Home Medications    Prior to Admission medications   Medication Sig Start Date End Date Taking? Authorizing Provider  acetaminophen (TYLENOL) 325 MG tablet Follow package instructions, you can take 2 tablets every 4 hours as needed. Your prescribed pain medication  also has Tylenol/acetaminophen in it. You cannot take more than 4000 mg per day. Count each prescribed pain medication as a Tylenol tablet also. 12/10/16   Earnstine Regal, PA-C  allopurinol (ZYLOPRIM) 300 MG tablet TAKE 1 TABLET BY MOUTH 2 TIMES DAILY 01/14/17   Carollee Herter, Alferd Apa, DO  calcium carbonate (TUMS - DOSED IN MG ELEMENTAL CALCIUM) 500 MG chewable tablet Chew 2 tablets by mouth 3 (three) times daily as needed for indigestion or heartburn.    [provider]  calcium-vitamin D (OSCAL WITH D) 500-200 MG-UNIT per tablet Take 1 tablet by mouth 2 (two) times daily.    [provider]  DEXILANT 60 MG capsule TAKE 1 CAPSULE (60 MG TOTAL) BY MOUTH EVERY MORNING 01/18/17   Carollee Herter, Yvonne R, DO  fenofibrate 160 MG tablet TAKE 1 TABLET (160 MG TOTAL) BY MOUTH DAILY. 01/20/17   Carollee Herter, Alferd Apa, DO  ferrous sulfate 325 (65 FE) MG EC tablet Take 1 tablet (325 mg total) by mouth 2 (two) times daily. 02/16/17   Irene Shipper, MD  JANUMET XR [id number] MG TB24 TAKE 1 TABLET BY MOUTH EVERY DAY 03/02/17   Carollee Herter, Yvonne R, DO  ONE TOUCH ULTRA TEST test strip ONETOUCH ULTRA BLUE TEST STRIPS CHECK BLOOD SUGAR DAILY. DX: E11.9 07/09/16   Carollee Herter, Alferd Apa, DO  ONETOUCH DELICA LANCETS FINE MISC Check glucose qd 06/24/15   Roma Schanz R, DO  oxyCODONE-acetaminophen (PERCOCET/ROXICET) 5-325 MG tablet Take 1-2 tablets by mouth every 4 (four) hours as needed for moderate pain. 12/10/16   Earnstine Regal, PA-C  potassium chloride SA (K-DUR,KLOR-CON) 20 MEQ tablet Take 1 tablet (20 mEq total) by mouth daily. 01/18/17   Ann Held, DO  ranitidine (ZANTAC) 150 MG tablet TAKE 1 TABLET (150 MG TOTAL) BY MOUTH AT BEDTIME. 02/18/17   Willia Craze, NP  rosuvastatin (CRESTOR) 20 MG tablet TAKE 1 TABLET (20 MG TOTAL) BY MOUTH DAILY. 01/11/17   Roma Schanz R, DO  valsartan-hydrochlorothiazide (DIOVAN-HCT) 80-12.5 MG tablet TAKE 1 TABLET BY MOUTH DAILY. 01/26/17   Ann Held, DO    Family History Family History  Problem Relation Age of Onset  . Diabetes Mother   . Hypertension Mother        entire family  . Stroke Father        CVA  . Hyperlipidemia Father        entire family  . Diabetes Father   . Heart disease Father   . Crohn's disease Son   . Irritable bowel syndrome Son     Social History Social History  Substance Use Topics  . Smoking status: Never Smoker  . Smokeless tobacco: Never Used  . Alcohol use No     Allergies   Aspirin; Other; and Penicillins   Review of Systems Review of Systems  Constitutional: Negative for fever.  Eyes: Negative for visual disturbance.  Respiratory: Negative for shortness of breath.   Cardiovascular: Negative for chest pain.  Gastrointestinal: Negative for vomiting.  Neurological: Positive for speech change, focal weakness, weakness and numbness. Negative for headaches.  All other systems reviewed and are negative.    Physical Exam Updated Vital Signs BP (!) 135/117 (BP Location: Left Arm)   Pulse (!) 116   Temp 97.8 F (36.6 C) (Oral)   Resp 20   Ht 1.676 m (5\' 6" )   Wt 122 kg (269 lb)   SpO2 99%   BMI 43.42 kg/m   Physical Exam CONSTITUTIONAL: Well developed/well nourished HEAD: Normocephalic/atraumatic EYES: EOMI/PERRL ENMT: Mucous membranes moist NECK: supple no meningeal signs SPINE/BACK:entire spine nontender CV: S1/S2 noted, no murmurs/rubs/gallops noted LUNGS: Lungs are clear to auscultation bilaterally, no apparent distress ABDOMEN: soft, nontender, no rebound or guarding, bowel sounds noted throughout abdomen GU:no cva tenderness NEURO: Pt is awake/alert/appropriate, moves all extremitiesx4.   ?mild left facial droop.  No dysarthria Decreased grip with left hand Mild drift of left UE.   She reports numbness/tingling in left UE No other signs of acute weakness  EXTREMITIES: pulses normal/equal, full ROM SKIN: warm, color normal PSYCH: no abnormalities  of mood noted, alert and oriented to situation   ED Treatments / Results  Labs (all labs ordered are listed, but only abnormal results are displayed) Labs Reviewed  CBC - Abnormal; Notable for the following:       Result Value   Hemoglobin 11.2 (*)    HCT 34.7 (*)    RDW 17.1 (*)    Platelets 99 (*)    All other components within normal limits  COMPREHENSIVE METABOLIC PANEL - Abnormal; Notable for the following:    Glucose, Bld 131 (*)    BUN 29 (*)    Creatinine, Ser 1.35 (*)    Total Protein 6.4 (*)    Alkaline Phosphatase 32 (*)    GFR calc non Af Amer 41 (*)    GFR calc Af Amer 47 (*)    All other components within normal limits  I-STAT CHEM 8, ED - Abnormal; Notable for the following:    BUN 28 (*)    Creatinine, Ser 1.40 (*)    Glucose, Bld 126 (*)    Hemoglobin 11.6 (*)    HCT 34.0 (*)    All other components within normal limits  PROTIME-INR  APTT  DIFFERENTIAL  I-STAT TROPOININ, ED    EKG  EKG Interpretation  Date/Time:  Sunday March 14 2017 05:47:56 EDT Ventricular Rate:  109 PR Interval:    QRS Duration: 103 QT Interval:  325 QTC Calculation: 438 R Axis:   -143 Text Interpretation:  Sinus tachycardia Consider right ventricular hypertrophy Consider anterior infarct No significant change since last tracing Confirmed by Ripley Fraise [phone]) on 03/14/2017 6:29:42 AM       Radiology Ct Head Wo Contrast  Result Date: 03/14/2017 CLINICAL DATA:  LEFT arm weakness and numbness 2130 hrs last night, unable to hold anything in LEFT hand, keeps dropping everything in LEFT hand, history hypertension, diabetes mellitus EXAM: CT HEAD WITHOUT CONTRAST TECHNIQUE: Contiguous axial images were obtained from the base of the skull through the vertex without intravenous contrast. Sagittal and coronal MPR images reconstructed from axial data set. COMPARISON:  None FINDINGS: Brain: Normal ventricular morphology. No midline shift or mass effect. Small area of slightly decreased  attenuation in the RIGHT parietal lobe consistent with acute infarct. No intracranial hemorrhage or mass lesion. No additional areas of infarction identified. Posterior fossa unremarkable. No extra-axial fluid collections. Vascular: Atherosclerotic calcification of internal carotid  arteries bilaterally at skullbase. Skull: Intact Sinuses/Orbits: Clear Other: N/A IMPRESSION: Acute nonhemorrhagic cortical infarct at high RIGHT parietal lobe. Electronically Signed   By: Lavonia Dana M.D.   On: 03/14/2017 07:02    Procedures Procedures   Medications Ordered in ED Medications  aspirin tablet 325 mg (not administered)     Initial Impression / Assessment and Plan / ED Course  I have reviewed the triage vital signs and the nursing notes.  Pertinent labs & imaging results that were available during my care of the patient were reviewed by me and considered in my medical decision making (see chart for details).     7:05 AM Pt with CT findings c/w stroke tPA in stroke considered but not given due to: Onset over 3-4.5hours  D/w dr Cheral Marker He has reviewed imaging Requests start ASA and admit to medicine at [facility] [place] Pt has intolerance to ASA ("stomach upset")  Will consult medicine   7:18 AM Repeat exam  no facial droop Mild left UE drift with decreased grip Left LE drift noted but limited due to left knee pain  Consulted dr Maryland Pink for admission    Final Clinical Impressions(s) / ED Diagnoses   Final diagnoses:  Cerebrovascular accident (CVA) due to occlusion of right middle cerebral artery [facility])  Renal insufficiency    New Prescriptions New Prescriptions   No medications on file     Ripley Fraise, MD 03/14/17 [phone]

## 2017-03-14 NOTE — ED Notes (Signed)
Patient transported to CT 

## 2017-03-14 NOTE — ED Triage Notes (Addendum)
Since about 2130 pm last night states left arm numbness and weakness noted states unable to hold anything in left hand she keeps dropping everything in left hand, in triage clear speech noted good strength in legs no pain voiced. With some slurred speech at 2130 pm last night per husband.

## 2017-03-14 NOTE — H&P (Signed)
History and Physical  MONCHEL POLLITT GPQ:[id number] DOB: [birth year] DOA: 03/14/2017  Referring physician: Julious Oka, ER physician  PCP: Ann Held, DO  Outpatient Specialists: None Patient coming from: Home & is able to ambulate using a walker  Chief Complaint: Left arm weakness   HPI: Rachel Black is a 66 y.o. female with medical history significant of hypertension (inconsistently takes her blood pressure medicine), diabetes mellitus and hyperlipidemia who was in her usual state of health when yesterday evening she noted that her left arm was weak. She kept dropping a bag of nuts. She made it back to her bed and the problem then again occurred. She called her husband who he felt like [facility] was slurred and her face was drooping also on the left side. They decided to wait for while before coming into the emergency room early this morning when symptoms of her left arm weakness persisted.  ED Course: In the emergency room, patient had blood work done noting a creatinine of 1.35 (baseline around 13 months ago) and a CT scan of the head noting an acute nonhemorrhagic cortical infarct in the high right parietal lobe.  Patient was given a dose of aspirin. Hospitalists were called for further evaluation and admission. ER noted that her left arm weakness appeared to be improved from when she first arrived. Her slurred speech and facial droop were not observed by them. Patient also passed a swallow evaluation.  Review of Systems: Patient seen in the emergency room . Pt complains of mild left arm weakness, although feeling better. She has chronic bilateral knee pain and decreased strength status post bilateral knee replacements. She also has a chronic left foot drop but has been going on for many many years.   Pt denies any headache, vision changes, dysphagia, chest pain, palpitations, shortness of breath, wheeze, cough, abdominal pain, hematuria, dysuria, constipation, diarrhea,  focal extremity numbness weakness or pain other than described above. Review of systems is otherwise negative .     Past Medical History:  Diagnosis Date  . Arthritis    PAIN AND OA BOTH KNEES AND SHOULDERS AND ELBOWS AND WRIST  . Blood transfusion without reported diagnosis 2018   after cholecystectomy  . Diabetes mellitus    ORAL MEDICATION  . GERD (gastroesophageal reflux disease)   . Gout    NO RECENT FLARE UPS  . H/O: rheumatic fever    AS A CHILD - NO KNOWN HEART MURMUR OR HEART PROBLEMS  . Hyperlipidemia   . Hypertension   . Shortness of breath    WITH EXERTION   Past Surgical History:  Procedure Laterality Date  . ABDOMINAL HYSTERECTOMY    . CESAREAN SECTION    . CHOLECYSTECTOMY N/A 12/09/2016   Procedure: LAPAROSCOPIC CHOLECYSTECTOMY WITH INTRAOPERATIVE CHOLANGIOGRAM;  Surgeon: Stark Klein, MD;  Location: [facility];  Service: General;  Laterality: N/A;  . CHOLECYSTECTOMY  12/09/2016  . COLONOSCOPY WITH PROPOFOL N/A 01/30/2013   Procedure: COLONOSCOPY WITH PROPOFOL;  Surgeon: Irene Shipper, MD;  Location: [facility];  Service: Endoscopy;  Laterality: N/A;  . ERCP N/A 12/08/2016   Procedure: ENDOSCOPIC RETROGRADE CHOLANGIOPANCREATOGRAPHY (ERCP);  Surgeon: Ladene Artist, MD;  Location: [facility];  Service: Endoscopy;  Laterality: N/A;  . ESOPHAGOGASTRODUODENOSCOPY (EGD) WITH PROPOFOL N/A 01/30/2013   Procedure: ESOPHAGOGASTRODUODENOSCOPY (EGD) WITH PROPOFOL;  Surgeon: Irene Shipper, MD;  Location: [facility];  Service: Endoscopy;  Laterality: N/A;  . Gallstone Surgeery  12/08/2016  . PARTIAL HYSTERECTOMY    .  TOTAL KNEE ARTHROPLASTY Right 04/20/2014   Procedure: RIGHT TOTAL KNEE ARTHROPLASTY CONVERTED TO RIGHT KNEE REIMPLANTATION;  Surgeon: Mcarthur Rossetti, MD;  Location: [facility];  Service: Orthopedics;  Laterality: Right;  . TOTAL KNEE ARTHROPLASTY Left 08/23/2015   Procedure: LEFT TOTAL KNEE ARTHROPLASTY;  Surgeon: Mcarthur Rossetti, MD;  Location: [facility];   Service: Orthopedics;  Laterality: Left;  Marland Kitchen VENTRAL HERNIA REPAIR     x2    Social History:  reports that she has never smoked. She has never used smokeless tobacco. She reports that she does not drink alcohol or use drugs. She lives at home with her husband. She ambulance using a walker because of her bad knees   Allergies  Allergen Reactions  . Aspirin     Regular ASA-stomach upset  . Other     SOME BANDAIDS CAUSE SKIN IRRITATION  . Penicillins Itching    Has patient had a PCN reaction causing immediate rash, facial/tongue/throat swelling, SOB or lightheadedness with hypotension: No Has patient had a PCN reaction causing severe rash involving mucus membranes or skin necrosis: No Has patient had a PCN reaction that required hospitalization No Has patient had a PCN reaction occurring within the last 10 years: No If all of the above answers are "NO", then may proceed with Cephalosporin use.     Family History  Problem Relation Age of Onset  . Diabetes Mother   . Hypertension Mother        entire family  . Stroke Father        CVA  . Hyperlipidemia Father        entire family  . Diabetes Father   . Heart disease Father   . Crohn's disease Son   . Irritable bowel syndrome Son       Prior to Admission medications   Medication Sig Start Date End Date Taking? Authorizing Provider  allopurinol (ZYLOPRIM) 300 MG tablet TAKE 1 TABLET BY MOUTH 2 TIMES DAILY 01/14/17  Yes Roma Schanz R, DO  calcium carbonate (TUMS - DOSED IN MG ELEMENTAL CALCIUM) 500 MG chewable tablet Chew 2 tablets by mouth 3 (three) times daily as needed for indigestion or heartburn.   Yes [provider]  calcium-vitamin D (OSCAL WITH D) 500-200 MG-UNIT per tablet Take 1 tablet by mouth daily with breakfast.    Yes [provider]  DEXILANT 60 MG capsule TAKE 1 CAPSULE (60 MG TOTAL) BY MOUTH EVERY MORNING 01/18/17  Yes Carollee Herter, Yvonne R, DO  fenofibrate 160 MG tablet TAKE 1 TABLET (160  MG TOTAL) BY MOUTH DAILY. 01/20/17  Yes Roma Schanz R, DO  ferrous sulfate 325 (65 FE) MG EC tablet Take 1 tablet (325 mg total) by mouth 2 (two) times daily. 02/16/17  Yes Irene Shipper, MD  JANUMET XR [phone] MG TB24 TAKE 1 TABLET BY MOUTH EVERY DAY 03/02/17  Yes Roma Schanz R, DO  potassium chloride SA (K-DUR,KLOR-CON) 20 MEQ tablet Take 1 tablet (20 mEq total) by mouth daily. 01/18/17  Yes Roma Schanz R, DO  ranitidine (ZANTAC) 150 MG tablet TAKE 1 TABLET (150 MG TOTAL) BY MOUTH AT BEDTIME. Patient taking differently: Take 150 mg by mouth 2 (two) times daily.  02/18/17  Yes Willia Craze, NP  rosuvastatin (CRESTOR) 20 MG tablet TAKE 1 TABLET (20 MG TOTAL) BY MOUTH DAILY. 01/11/17  Yes Roma Schanz R, DO  valsartan-hydrochlorothiazide (DIOVAN-HCT) 80-12.5 MG tablet TAKE 1 TABLET BY MOUTH DAILY. 01/26/17  Yes  Lowne Chase, Yvonne R, DO  ONE TOUCH ULTRA TEST test strip ONETOUCH ULTRA BLUE TEST STRIPS CHECK BLOOD SUGAR DAILY. DX: E11.9 07/09/16   Carollee Herter, Alferd Apa, DO  ONETOUCH DELICA LANCETS FINE MISC Check glucose qd 06/24/15   Ann Held, DO    Physical Exam: BP 125/62 (BP Location: Right Arm)   Pulse 70   Temp 97.2 F (36.2 C) (Oral)   Resp 20   Ht 5' 6"  (1.676 m)   Wt 122 kg (269 lb)   SpO2 96%   BMI 43.42 kg/m   General:  Alert and oriented 3, no acute distress  Eyes: Sclera nonicteric, extraocular movements are intact  Black: Normocephalic, atraumatic, mucous membranes are slight dry  Neck: Supple, no JVD, no carotid bruits  Cardiovascular: Soft, regular rate and rhythm, S1-S2  Respiratory: Clear to auscultation bilaterally  Abdomen: Soft, obese, nontender, positive bowel sounds  Skin: No skin breaks, tears or lesions  Musculoskeletal: No clubbing or cyanosis, trace pitting edema.  Grip is 5 minus/5 symmetric. For flexion and extension of upper extremities, right actually appears slightly weaker than left elbow both do appear weak. She has  some issues with left upper extremity fine motor control. Psychiatric: Patient is appropriate, no evidence of psychoses  Neurologic: Cranial nerves II through XII are intact. No dysmetria. Downgoing toes. She does have evidence of left foot drop-chronic issue.          Labs on Admission:  Basic Metabolic Panel:  Recent Labs Lab 03/14/17 0550 03/14/17 0644  NA 141 139  K 4.1 4.0  CL 107 107  CO2 23  --   GLUCOSE 131* 126*  BUN 29* 28*  CREATININE 1.35* 1.40*  CALCIUM 10.2  --    Liver Function Tests:  Recent Labs Lab 03/14/17 0550  AST 31  ALT 20  ALKPHOS 32*  BILITOT 0.4  PROT 6.4*  ALBUMIN 3.9   No results for input(s): LIPASE, AMYLASE in the last 168 hours. No results for input(s): AMMONIA in the last 168 hours. CBC:  Recent Labs Lab 03/14/17 0550 03/14/17 0644  WBC 5.3  --   NEUTROABS 3.5  --   HGB 11.2* 11.6*  HCT 34.7* 34.0*  MCV 88.3  --   PLT 99*  --    Cardiac Enzymes: No results for input(s): CKTOTAL, CKMB, CKMBINDEX, TROPONINI in the last 168 hours.  BNP (last 3 results) No results for input(s): BNP in the last 8760 hours.  ProBNP (last 3 results) No results for input(s): PROBNP in the last 8760 hours.  CBG: No results for input(s): GLUCAP in the last 168 hours.  Radiological Exams on Admission: Ct Head Wo Contrast  Result Date: 03/14/2017 CLINICAL DATA:  LEFT arm weakness and numbness 2130 hrs last night, unable to hold anything in LEFT hand, keeps dropping everything in LEFT hand, history hypertension, diabetes mellitus EXAM: CT HEAD WITHOUT CONTRAST TECHNIQUE: Contiguous axial images were obtained from the base of the skull through the vertex without intravenous contrast. Sagittal and coronal MPR images reconstructed from axial data set. COMPARISON:  None FINDINGS: Brain: Normal ventricular morphology. No midline shift or mass effect. Small area of slightly decreased attenuation in the RIGHT parietal lobe consistent with acute infarct. No  intracranial hemorrhage or mass lesion. No additional areas of infarction identified. Posterior fossa unremarkable. No extra-axial fluid collections. Vascular: Atherosclerotic calcification of internal carotid arteries bilaterally at skullbase. Skull: Intact Sinuses/Orbits: Clear Other: N/A IMPRESSION: Acute nonhemorrhagic cortical infarct at high RIGHT  parietal lobe. Electronically Signed   By: Lavonia Dana M.D.   On: 03/14/2017 07:02    EKG: Independently reviewed. Sinus tachycardia with right ventricular hypertrophy. No change from previous EKG  Assessment/Plan Present on Admission: . Acute CVA (cerebrovascular accident) ([place]) of the right parietal lobe: I suspected likely she has small vessel disease. Nevertheless, full workup including MRI, echo, Dopplers and blood work including A1c and lipid panel. PT to evaluate. Patient had a recent gastric polypectomy with small hemorrhage requiring stasis clips approximately 1 month ago. I spoke with GI and it is safe for her to get MRI. Daily aspirin will start. Patient used to be on a daily aspirin but was told to stop several years ago prior to her knee surgeries. She never restarted.  . Essential hypertension: Patient states that she does not take her blood pressure medications consistently. Have reinforced the need to. Monitor blood pressure now and expected to be high during this hospitalization given acute CVA.  Marland Kitchen Hyperlipidemia LDL goal <70: Already on statin and fenofibrate. Checking fasting lipid panel in the morning  . Morbid obesity [facility]): Patient meets criteria with BMI greater than 40 . AKI (acute kidney injury) ([place]): Likely from dehydration. Gently hydrate  Diabetes mellitus type 2: Patient just on Januvia. She states that she's been told is under good control. We'll go ahead and check an A1c. Her last A1c 3 months ago was at 5.4.  Active Problems:   DM2 (diabetes mellitus, type 2) (H[facility])   Hyperlipidemia LDL goal <70   Severe obesity  (BMI >= 40) ([facility])   Essential hypertension   Morbid obesity ([facility])   AKI (acute kidney injury) ([place])   Acute CVA (cerebrovascular accident) ([place])   DVT prophylaxis: SCDs   Code Status: Full code as confirmed by patient   Family Communication: Husband at the bedside   Disposition Plan: Potential discharge home tomorrow once workup complete   Consults called: Neurology/stroke service consulted. Will see patient once arrival to [facility]   Admission status: Likely discharge tomorrow once workup complete. Place under observation     Annita Brod MD [facility] Pager [phone]  If 7PM-7AM, please contact night-coverage [email or web address] Password [email or web address]  03/14/2017, 12:01 PM

## 2017-03-14 NOTE — ED Notes (Signed)
Report given to [facility] 

## 2017-03-15 ENCOUNTER — Other Ambulatory Visit (HOSPITAL_COMMUNITY): Payer: BLUE CROSS/BLUE SHIELD

## 2017-03-15 ENCOUNTER — Observation Stay (HOSPITAL_BASED_OUTPATIENT_CLINIC_OR_DEPARTMENT_OTHER): Payer: BLUE CROSS/BLUE SHIELD

## 2017-03-15 ENCOUNTER — Observation Stay (HOSPITAL_COMMUNITY): Payer: BLUE CROSS/BLUE SHIELD

## 2017-03-15 DIAGNOSIS — I360 Nonrheumatic tricuspid (valve) stenosis: Secondary | ICD-10-CM

## 2017-03-15 DIAGNOSIS — E785 Hyperlipidemia, unspecified: Secondary | ICD-10-CM | POA: Diagnosis present

## 2017-03-15 DIAGNOSIS — Z7982 Long term (current) use of aspirin: Secondary | ICD-10-CM | POA: Diagnosis not present

## 2017-03-15 DIAGNOSIS — I639 Cerebral infarction, unspecified: Secondary | ICD-10-CM

## 2017-03-15 DIAGNOSIS — K219 Gastro-esophageal reflux disease without esophagitis: Secondary | ICD-10-CM | POA: Diagnosis present

## 2017-03-15 DIAGNOSIS — Z794 Long term (current) use of insulin: Secondary | ICD-10-CM | POA: Diagnosis not present

## 2017-03-15 DIAGNOSIS — Z79899 Other long term (current) drug therapy: Secondary | ICD-10-CM | POA: Diagnosis not present

## 2017-03-15 DIAGNOSIS — Z880 Allergy status to penicillin: Secondary | ICD-10-CM | POA: Diagnosis not present

## 2017-03-15 DIAGNOSIS — I638 Other cerebral infarction: Secondary | ICD-10-CM | POA: Diagnosis not present

## 2017-03-15 DIAGNOSIS — N289 Disorder of kidney and ureter, unspecified: Secondary | ICD-10-CM | POA: Diagnosis present

## 2017-03-15 DIAGNOSIS — E1165 Type 2 diabetes mellitus with hyperglycemia: Secondary | ICD-10-CM | POA: Diagnosis present

## 2017-03-15 DIAGNOSIS — Z6841 Body Mass Index (BMI) 40.0 and over, adult: Secondary | ICD-10-CM | POA: Diagnosis not present

## 2017-03-15 DIAGNOSIS — E876 Hypokalemia: Secondary | ICD-10-CM | POA: Diagnosis present

## 2017-03-15 DIAGNOSIS — E1121 Type 2 diabetes mellitus with diabetic nephropathy: Secondary | ICD-10-CM

## 2017-03-15 DIAGNOSIS — I63411 Cerebral infarction due to embolism of right middle cerebral artery: Secondary | ICD-10-CM | POA: Diagnosis present

## 2017-03-15 DIAGNOSIS — N179 Acute kidney failure, unspecified: Secondary | ICD-10-CM | POA: Diagnosis present

## 2017-03-15 DIAGNOSIS — I10 Essential (primary) hypertension: Secondary | ICD-10-CM | POA: Diagnosis present

## 2017-03-15 DIAGNOSIS — I63511 Cerebral infarction due to unspecified occlusion or stenosis of right middle cerebral artery: Secondary | ICD-10-CM

## 2017-03-15 DIAGNOSIS — Q211 Atrial septal defect: Secondary | ICD-10-CM | POA: Diagnosis not present

## 2017-03-15 DIAGNOSIS — R29700 NIHSS score 0: Secondary | ICD-10-CM | POA: Diagnosis present

## 2017-03-15 DIAGNOSIS — Z96653 Presence of artificial knee joint, bilateral: Secondary | ICD-10-CM | POA: Diagnosis present

## 2017-03-15 LAB — ECHOCARDIOGRAM COMPLETE
CHL CUP RV SYS PRESS: 22 mmHg
CHL CUP TV REG PEAK VELOCITY: 220 cm/s
E/e' ratio: 6.92
FS: 36 % (ref 28–44)
HEIGHTINCHES: 66 in
IVS/LV PW RATIO, ED: 0.82
LA ID, A-P, ES: 47 mm
LA diam end sys: 47 mm
LA diam index: 2.07 cm/m2
LA vol A4C: 54.2 ml
LA vol index: 25.2 mL/m2
LAVOL: 57.2 mL
LV PW d: 11 mm — AB (ref 0.6–1.1)
LV TDI E'MEDIAL: 6.42
LV e' LATERAL: 9.25 cm/s
LVEEAVG: 6.92
LVEEMED: 6.92
LVOT VTI: 23 cm
LVOT area: 4.15 cm2
LVOT peak grad rest: 3 mmHg
LVOTD: 23 mm
LVOTPV: 90.9 cm/s
LVOTSV: 95 mL
MV pk A vel: 69.4 m/s
MV pk E vel: 64 m/s
RV LATERAL S' VELOCITY: 16.2 cm/s
RV TAPSE: 20.3 mm
TDI e' lateral: 9.25
TRMAXVEL: 220 cm/s
Weight: 4304 oz

## 2017-03-15 LAB — RAPID URINE DRUG SCREEN, HOSP PERFORMED
AMPHETAMINES: NOT DETECTED
BENZODIAZEPINES: NOT DETECTED
Barbiturates: NOT DETECTED
COCAINE: NOT DETECTED
Opiates: NOT DETECTED
TETRAHYDROCANNABINOL: NOT DETECTED

## 2017-03-15 LAB — GLUCOSE, CAPILLARY
GLUCOSE-CAPILLARY: 161 mg/dL — AB (ref 65–99)
Glucose-Capillary: 115 mg/dL — ABNORMAL HIGH (ref 65–99)
Glucose-Capillary: 111 mg/dL — ABNORMAL HIGH (ref 65–99)
Glucose-Capillary: 109 mg/dL — ABNORMAL HIGH (ref 65–99)

## 2017-03-15 LAB — LIPID PANEL
CHOL/HDL RATIO: 5.4 ratio
CHOLESTEROL: 125 mg/dL (ref 0–200)
HDL: 23 mg/dL — AB (ref >40)
LDL Cholesterol: 45 mg/dL (ref 0–99)
TRIGLYCERIDES: 284 mg/dL — AB (ref <150)
VLDL: 57 mg/dL — AB (ref 0–40)

## 2017-03-15 MED ORDER — IOPAMIDOL (ISOVUE-370) INJECTION 76%
INTRAVENOUS | Status: AC
  Administered 2017-03-15: 50 mL
  Filled 2017-03-15: qty 50

## 2017-03-15 NOTE — Progress Notes (Signed)
    [facility] has been requested to perform a transesophageal echocardiogram on Rachel Black for stroke.  After careful review of history and examination, the risks and benefits of transesophageal echocardiogram have been explained including risks of esophageal damage, perforation (1:10,000 risk), bleeding, pharyngeal hematoma as well as other potential complications associated with conscious sedation including aspiration, arrhythmia, respiratory failure and death. Alternatives to treatment were discussed, questions were answered. Patient is willing to proceed.   She is scheduled for 1500 tomorrow with Dr. Acie Fredrickson.  Tami Lin [place], [place]  03/15/2017 3:00 PM

## 2017-03-15 NOTE — Progress Notes (Signed)
Preliminary results by tech - Venous Duplex Lower Ext. Completed. Negative for deep and superficial vein thrombosis in both legs.  Leovardo Thoman, BS, RDMS, RVT  

## 2017-03-15 NOTE — Progress Notes (Signed)
STROKE TEAM PROGRESS NOTE   HISTORY OF PRESENT ILLNESS (per record) Rachel Black is a 66 y.o. female with a history of rheumatic fever, hypertension (inconsistently takes her blood pressure medicine), diabetes mellitus and hyperlipidemia who presented with left arm weakness and slurred speech on 03/14/2017.  The patient noted that she kept dropping objects with her left hand, and her husband noted that she was slurring her words.  They waited to see if her symptoms would improve, and drove to the ED on the morning of 03/14/2017 when they did not.  Of note, the patient has as left foot drop at baseline as a complication of her bilateral knee arthoplasty, and was using a walker at home prior to admission.  That patient underwent laparoscopic cholecystectomy in March 2018.  CT head with acute high right cortical parietal infarct.  MR with right lateral motor strip with cytotoxic edema.  Negative MRA and CTA head.   Patient was not administered IV t-PA secondary to arriving outside of the treatment window. She was admitted to General Neurology for further evaluation and treatment.   SUBJECTIVE (INTERVAL HISTORY) Her husband is at the bedside.  The patient is alert, oriented, and follows all commands appropriately. Denies heart palpitation, heart fluttering. Had rheumatic fever in childhood, but no other heart issues. Still has slight left nasolabial fold flattening and left hand dexterity difficulties.   OBJECTIVE Temp:  [97.2 F (36.2 C)-98.6 F (37 C)] 98.1 F (36.7 C) (07/02 0921) Pulse Rate:  [64-85] 77 (07/02 0921) Cardiac Rhythm: Sinus tachycardia (07/02 0704) Resp:  [16-20] 16 (07/02 0921) BP: (105-138)/(57-68) 129/62 (07/02 0921) SpO2:  [96 %-100 %] 98 % (07/02 0921)  CBC:  Recent Labs Lab 03/14/17 0550 03/14/17 0644  WBC 5.3  --   NEUTROABS 3.5  --   HGB 11.2* 11.6*  HCT 34.7* 34.0*  MCV 88.3  --   PLT 99*  --     Basic Metabolic Panel:  Recent Labs Lab 03/14/17 0550  03/14/17 0644  NA 141 139  K 4.1 4.0  CL 107 107  CO2 23  --   GLUCOSE 131* 126*  BUN 29* 28*  CREATININE 1.35* 1.40*  CALCIUM 10.2  --     Lipid Panel:    Component Value Date/Time   CHOL 125 03/15/2017 0402   TRIG 284 (H) 03/15/2017 0402   HDL 23 (L) 03/15/2017 0402   CHOLHDL 5.4 03/15/2017 0402   VLDL 57 (H) 03/15/2017 0402   LDLCALC 45 03/15/2017 0402   HgbA1c:  Lab Results  Component Value Date   HGBA1C 5.4 12/18/2016   Urine Drug Screen: No results found for: LABOPIA, COCAINSCRNUR, LABBENZ, AMPHETMU, THCU, LABBARB  Alcohol Level No results found for: [place] I have personally reviewed the radiological images below and agree with the radiology interpretations.  Ct Head Wo Contrast 03/14/2017 IMPRESSION: Acute nonhemorrhagic cortical infarct at high RIGHT parietal lobe.  Ct Angio Neck W Or Wo Contrast 03/15/2017 IMPRESSION: Negative CTA of the neck. No stenosis, atherosclerosis, or embolic source.  Mr Brain 52 Contrast Mr Jodene Nam Head/brain Wo Cm 03/14/2017 IMPRESSION: 1. Acute infarct along the right lateral motor strip corresponding to the noncontrast CT finding earlier today. Gyral cytotoxic edema but no associated hemorrhage or mass effect. 2.  Negative intracranial MRA. 3. Evidence of mild for age underlying small vessel disease.  TTE - Left ventricle: The cavity size was normal. Wall thickness was   normal. Systolic function was normal. The estimated ejection  fraction was in the range of 55% to 60%. Doppler parameters are   consistent with abnormal left ventricular relaxation (grade 1   diastolic dysfunction).  LE venous Doppler pending  TEE pending    PHYSICAL EXAM  Temp:  [98.1 F (36.7 C)-98.7 F (37.1 C)] 98.7 F (37.1 C) (07/02 1324) Pulse Rate:  [64-77] 74 (07/02 1324) Resp:  [16-20] 16 (07/02 1324) BP: (105-138)/(53-65) 115/53 (07/02 1324) SpO2:  [96 %-100 %] 99 % (07/02 1324)  General - Well nourished, well developed, in no apparent  distress.  Ophthalmologic - Sharp disc margins OU.  Cardiovascular - Regular rate and rhythm.  Mental Status -  Level of arousal and orientation to time, place, and person were intact. Language including expression, naming, repetition, comprehension was assessed and found intact. Fund of Knowledge was assessed and was intact.  Cranial Nerves II - XII - II - Visual field intact OU. III, IV, VI - Extraocular movements intact. V - Facial sensation intact bilaterally. VII - left nasolabial fold flattening. VIII - Hearing & vestibular intact bilaterally. X - Palate elevates symmetrically. XI - Chin turning & shoulder shrug intact bilaterally. XII - Tongue protrusion intact.  Motor Strength - The patient's strength was normal in all extremities except left hand mild dexterity difficulties, and chronic left foot drop with DF 3-/5 and pronator drift was absent.  Bulk was normal and fasciculations were absent.   Motor Tone - Muscle tone was assessed at the neck and appendages and was normal.  Reflexes - The patient's reflexes were 1+ in all extremities and she had no pathological reflexes.  Sensory - Light touch, temperature/pinprick were assessed and were symmetrical.    Coordination - The patient had normal movements in the hands with no ataxia or dysmetria.  Tremor was absent.  Gait and Station - walks with walker, slow, but steady gait.   ASSESSMENT/PLAN [name] is a 66 y.o. female with history of rheumatic fever, hypertension (inconsistently takes her blood pressure medicine), diabetes mellitus and hyperlipidemia presenting with left arm weakness and slurred speech. She did not receive IV t-PA due to arriving outside of the treatment window.   Stroke: Acute right MCA cortical infarct likely embolic, etiology unclear.   Resultant  mild left facial asymmetry, and left hand dexterity difficulty  CT head: Acute cortical infarct at high RIGHT parietal lobe.  MRI head:   Acute infarct along the right lateral motor strip    MRA head Negative intracranial MRA.  CTA neck unremarkable  2D Echo: EF 55-60%  TEE: pending, consider loop recorder if TEE negative  LE venous Doppler pending  LDL: 45  HgbA1c pending  SCDs for VTE prophylaxis  Diet Carb Modified Fluid consistency: Thin; Room service appropriate? Yes  No antithrombotic prior to admission, now on aspirin 325 mg daily. Continue aspirin discharge  Patient counseled to be compliant with her antithrombotic medications  Ongoing aggressive stroke risk factor management  Therapy recommendations:  pending  Disposition:  Pending  History of rheumatic fever in childhood  No significant cardiac issue since then  TTE unremarkable  TEE pending  Hypertension  Stable  Permissive hypertension (OK if < 220/120) but gradually normalize in 5-7 days  Long-term BP goal normotensive  Diabetes  HgbA1c pending, goal < 7.0  Controlled  CBG monitoring  Hyperlipidemia   Home medication - Crestor and fenofibrate  LDL 45, goal < 70  Continue Crestor  Continue fenofibrate  Other Stroke Risk Factors  Obesity, Body mass index  is 43.42 kg/m., recommend weight loss, diet and exercise as appropriate   Family hx stroke (father)  Other Active Problems  S/p bilateral knee surgery  Chronic left foot drop after left knee surgery  Hospital day # 0  Rosalin Hawking, MD PhD Stroke Neurology 03/15/2017 2:26 PM    To contact Stroke Continuity provider, please refer to [email or web address] After hours, contact General Neurology

## 2017-03-15 NOTE — Progress Notes (Signed)
OT Cancellation Note  Patient Details Name: Rachel Black MRN: [id number] DOB: 08-21-1951   Cancelled Treatment:    Reason Eval/Treat Not Completed: Patient at procedure or test/ unavailable (vascular lab)  [place], OT/L  [phone] 03/15/2017 03/15/2017, 9:09 AM

## 2017-03-15 NOTE — Progress Notes (Signed)
  Echocardiogram 2D Echocardiogram has been performed.  Rachel Black 03/15/2017, 9:43 AM

## 2017-03-15 NOTE — Evaluation (Signed)
Speech Language Pathology Evaluation Patient Details Name: VONNA BRABSON MRN: [id number] DOB: 07-Jul-1951 Today's Date: 03/15/2017 Time: 5916-3846 SLP Time Calculation (min) (ACUTE ONLY): 25 min  Problem List:  Patient Active Problem List   Diagnosis Date Noted  . Acute CVA (cerebrovascular accident) ([place]) 03/14/2017  . Dilation of biliary tract   . Elevated LFTs   . RUQ abdominal pain   . Acute gallstone pancreatitis 12/07/2016  . Choledocholithiasis with obstruction 12/07/2016  . AKI (acute kidney injury) ([place]) 12/07/2016  . Osteoarthritis of right knee 08/23/2015  . Status post total left knee replacement 08/23/2015  . Arthritis of knee, right 04/20/2014  . Morbid obesity ([place]) 04/20/2014  . Status post total right knee replacement 04/20/2014  . [place] reaction 03/10/2012  . DM2 (diabetes mellitus, type 2) ([place]) 02/17/2007  . Hyperlipidemia LDL goal <70 02/17/2007  . GOUT 02/17/2007  . Severe obesity (BMI >= 40) ([place]) 02/17/2007  . Essential hypertension 02/17/2007  . RHEUMATIC FEVER, HX OF 02/17/2007   Past Medical History:  Past Medical History:  Diagnosis Date  . Arthritis    PAIN AND OA BOTH KNEES AND SHOULDERS AND ELBOWS AND WRIST  . Blood transfusion without reported diagnosis 2018   after cholecystectomy  . Diabetes mellitus    ORAL MEDICATION  . GERD (gastroesophageal reflux disease)   . Gout    NO RECENT FLARE UPS  . H/O: rheumatic fever    AS A CHILD - NO KNOWN HEART MURMUR OR HEART PROBLEMS  . Hyperlipidemia   . Hypertension   . Shortness of breath    WITH EXERTION   Past Surgical History:  Past Surgical History:  Procedure Laterality Date  . ABDOMINAL HYSTERECTOMY    . CESAREAN SECTION    . CHOLECYSTECTOMY N/A 12/09/2016   Procedure: LAPAROSCOPIC CHOLECYSTECTOMY WITH INTRAOPERATIVE CHOLANGIOGRAM;  Surgeon: Stark Klein, MD;  Location: [facility];  Service: General;  Laterality: N/A;  . CHOLECYSTECTOMY  12/09/2016  . COLONOSCOPY WITH PROPOFOL N/A  01/30/2013   Procedure: COLONOSCOPY WITH PROPOFOL;  Surgeon: Irene Shipper, MD;  Location: [facility];  Service: Endoscopy;  Laterality: N/A;  . ERCP N/A 12/08/2016   Procedure: ENDOSCOPIC RETROGRADE CHOLANGIOPANCREATOGRAPHY (ERCP);  Surgeon: Ladene Artist, MD;  Location: [facility];  Service: Endoscopy;  Laterality: N/A;  . ESOPHAGOGASTRODUODENOSCOPY (EGD) WITH PROPOFOL N/A 01/30/2013   Procedure: ESOPHAGOGASTRODUODENOSCOPY (EGD) WITH PROPOFOL;  Surgeon: Irene Shipper, MD;  Location: [facility];  Service: Endoscopy;  Laterality: N/A;  . Gallstone Surgeery  12/08/2016  . PARTIAL HYSTERECTOMY    . TOTAL KNEE ARTHROPLASTY Right 04/20/2014   Procedure: RIGHT TOTAL KNEE ARTHROPLASTY CONVERTED TO RIGHT KNEE REIMPLANTATION;  Surgeon: Mcarthur Rossetti, MD;  Location: [facility];  Service: Orthopedics;  Laterality: Right;  . TOTAL KNEE ARTHROPLASTY Left 08/23/2015   Procedure: LEFT TOTAL KNEE ARTHROPLASTY;  Surgeon: Mcarthur Rossetti, MD;  Location: [facility];  Service: Orthopedics;  Laterality: Left;  Marland Kitchen VENTRAL HERNIA REPAIR     x2   HPI:  Pt is a 66 y.o. female with PMH significant for essential hypertension, diabetes mellitus type 2 and hyperlipidemia, who noticed the day before admission she started having left arm weakness. CT scan of the head showed a nonhemorrhagic cortical infarct in the right parietal lobe. Acute infarct on the right lateral motor strip per MRI.   Assessment / Plan / Recommendation Clinical Impression  Pt appears to be at or near her cognitive baseline. She scored a 23/30 on the [place], but she also did not attempt  the subtest with serial subtraction, stating that she does not do well with math and that her husband handles all the finances. She also shared that she had some memory deficits at baseline prior to this CVA. She denies any acute changes even after reviewing scoring errors. Her speech is clear and intelligible at the conversational level despite a subtle L facial  droop, and she believes this is at baseline as well. No SLP f/u recommended at this time; however, would encourage increased supervision upon initial return home as an extra precaution.    SLP Assessment  SLP Recommendation/Assessment: Patient does not need any further Speech Lanaguage Pathology Services SLP Visit Diagnosis: Dysarthria and anarthria (R47.1)    Follow Up Recommendations  24 hour supervision/assistance    Frequency and Duration           SLP Evaluation Cognition  Overall Cognitive Status: History of cognitive impairments - at baseline       Comprehension  Auditory Comprehension Overall Auditory Comprehension: Appears within functional limits for tasks assessed    Expression Expression Primary Mode of Expression: Verbal Verbal Expression Overall Verbal Expression: Appears within functional limits for tasks assessed Written Expression Dominant Hand: Right   Oral / Motor  Oral Motor/Sensory Function Overall Oral Motor/Sensory Function: Mild impairment (mild L facial droop) Motor Speech Overall Motor Speech: Appears within functional limits for tasks assessed   GO          Functional Assessment Tool Used: skilled clinical judgment Functional Limitations: Memory Memory Current Status (C1275): At least 20 percent but less than 40 percent impaired, limited or restricted Memory Goal Status (T7001): At least 20 percent but less than 40 percent impaired, limited or restricted Memory Discharge Status [phone]): At least 20 percent but less than 40 percent impaired, limited or restricted         Germain Osgood 03/15/2017, 3:47 PM  Germain Osgood, M.A. CCC-SLP [phone]

## 2017-03-15 NOTE — Evaluation (Addendum)
SLP Cancellation Note  Patient Details Name: Rachel Black MRN: [id number] DOB: 1951/06/11   Cancelled treatment:       Reason Eval/Treat Not Completed: Other (comment) (pt eating breakfast and then for ECHO) will continue efforts for SLE   Claudie Fisherman, [facility] SLP [phone]

## 2017-03-15 NOTE — Evaluation (Addendum)
Physical Therapy Evaluation Patient Details Name: Rachel Black MRN: [id number] DOB: 1950-11-04 Today's Date: 03/15/2017   History of Present Illness  66 y.o. female past medical history significant for essential hypertension, diabetes mellitus type 2 and hyperlipidemia who noticed the day before admission she started having left arm weakness. She called her husband who noticed her face history of seizures brought here into the ED. CT scan of the head showed a nonhemorrhagic cortical infarct in the right parietal lobe. Acute infarct on the right lateral motor strip per MRI.  Clinical Impression  Orders received for PT evaluation. Patient demonstrates deficits in functional mobility as indicated below. Will benefit from continued skilled PT to address deficits and maximize function. Will see as indicated and progress as tolerated.  Anticipate patient will be safe for d/c home, UE deficits more apparent, will defer disposition needs to OT therapist at this time.    Follow Up Recommendations  Home health PT    Equipment Recommendations  None recommended by PT    Recommendations for Other Services       Precautions / Restrictions Precautions Precautions: Fall      Mobility  Bed Mobility Overal bed mobility: Needs Assistance Bed Mobility: Rolling;Sidelying to Sit;Sit to Supine Rolling: Supervision Sidelying to sit: Supervision   Sit to supine: Supervision   General bed mobility comments: increased time and effort, no physical assist rquired at this time  Transfers Overall transfer level: Needs assistance Equipment used: Rolling walker (2 wheeled) Transfers: Sit to/from Stand Sit to Stand: Min guard         General transfer comment: min guard for safety, no physical assist required  Ambulation/Gait Ambulation/Gait assistance: Supervision Ambulation Distance (Feet): 110 Feet Assistive device: Rolling walker (2 wheeled) Gait Pattern/deviations: Step-through  pattern;Decreased dorsiflexion - left;Wide base of support;Trunk flexed Gait velocity: decreased Gait velocity interpretation: Below normal speed for age/gender General Gait Details: patient with some modest instability during ambulation using RW. Tolerated some higher level tasks without physical assist but showed decreased cadence.  Ambulated 96ft without device, increased lateral sway and need for HHA to steady  Stairs            Wheelchair Mobility    Modified Rankin (Stroke Patients Only) Modified Rankin (Stroke Patients Only) Pre-Morbid Rankin Score: No symptoms Modified Rankin: Moderately severe disability     Balance Overall balance assessment: History of Falls (reports BLE gave out, denires dizziness prior to event)                                           Pertinent Vitals/Pain Pain Assessment: No/denies pain    Home Living Family/patient expects to be discharged to:: Private residence Living Arrangements: Spouse/significant other Available Help at Discharge: Family Type of Home: House Home Access: Stairs to enter Entrance Stairs-Rails: None Technical brewer of Steps: 2 Home Layout: One level Home Equipment: Environmental consultant - 2 wheels;Crutches;Cane - single point      Prior Function Level of Independence: Independent with assistive device(s)         Comments: patient uses RW at home for mobility, ocassionally with furniture surf without device     Hand Dominance   Dominant Hand: Right    Extremity/Trunk Assessment   Upper Extremity Assessment Upper Extremity Assessment: Defer to OT evaluation    Lower Extremity Assessment Lower Extremity Assessment: Generalized weakness;LLE deficits/detail LLE Deficits / Details: baseline decreased  dorsiflexion from history of knee surgery with neurological complication LLE Coordination: decreased fine motor    Cervical / Trunk Assessment Cervical / Trunk Assessment:  (increased body habitus)   Communication   Communication: No difficulties  Cognition Arousal/Alertness: Awake/alert Behavior During Therapy: Flat affect Overall Cognitive Status: Impaired/Different from baseline Area of Impairment: Problem solving                             Problem Solving: Slow processing General Comments: per spouse present at bedside, patient with some flattened affect and decreased processing in the past hour since going to MRI      General Comments      Exercises     Assessment/Plan    PT Assessment Patient needs continued PT services  PT Problem List Decreased strength;Decreased activity tolerance;Decreased balance;Decreased mobility;Decreased coordination;Decreased cognition       PT Treatment Interventions DME instruction;Gait training;Stair training;Functional mobility training;Therapeutic activities;Therapeutic exercise;Balance training;Neuromuscular re-education;Cognitive remediation;Patient/family education    PT Goals (Current goals can be found in the Care Plan section)  Acute Rehab PT Goals Patient Stated Goal: to go home PT Goal Formulation: With patient/family Time For Goal Achievement: 03/29/17 Potential to Achieve Goals: Good    Frequency Min 3X/week   Barriers to discharge        Co-evaluation               AM-PAC PT "6 Clicks" Daily Activity  Outcome Measure Difficulty turning over in bed (including adjusting bedclothes, sheets and blankets)?: A Little Difficulty moving from lying on back to sitting on the side of the bed? : A Little Difficulty sitting down on and standing up from a chair with arms (e.g., wheelchair, bedside commode, etc,.)?: A Little Help needed moving to and from a bed to chair (including a wheelchair)?: A Little Help needed walking in hospital room?: A Little Help needed climbing 3-5 steps with a railing? : A Lot 6 Click Score: 17    End of Session Equipment Utilized During Treatment: Gait belt Activity  Tolerance: Patient tolerated treatment well Patient left: in bed;with call bell/phone within reach;with bed alarm set;with family/visitor present Nurse Communication: Mobility status PT Visit Diagnosis: Unsteadiness on feet (R26.81);Difficulty in walking, not elsewhere classified (R26.2)    Time: 4540-9811 PT Time Calculation (min) (ACUTE ONLY): 22 min   Charges:   PT Evaluation $PT Eval Moderate Complexity: 1 Procedure     PT G Codes:   PT G-Codes **NOT FOR INPATIENT CLASS** Functional Assessment Tool Used: Clinical judgement Functional Limitation: Mobility: Walking and moving around Mobility: Walking and Moving Around Current Status (B1478): At least 1 percent but less than 20 percent impaired, limited or restricted Mobility: Walking and Moving Around Goal Status [phone]): 0 percent impaired, limited or restricted    Alben Deeds, PT DPT [facility] [place] 03/15/2017, 9:31 AM

## 2017-03-15 NOTE — Progress Notes (Signed)
Occupational Therapy Evaluation Patient Details Name: Rachel Black MRN: [id number] DOB: 08-May-1951 Today's Date: 03/15/2017    History of Present Illness 66 y.o. female past medical history significant for essential hypertension, diabetes mellitus type 2 and hyperlipidemia who noticed the day before admission she started having left arm weakness. She called her husband who noticed her face history of seizures brought here into the ED. CT scan of the head showed a nonhemorrhagic cortical infarct in the right parietal lobe. Acute infarct on the right lateral motor strip per MRI.   Clinical Impression   PTA, pt modified independent with mobility and ADL. Pt's husband dries her around in the community and assists with IADL tasks as needed. Pt currently requires S with mobility at RW level and minguard A for ADL. Pt demonstrates significant sensorimotor deficits with LUE, interring with her ability to complete tasks. Began education on HEP for fine motor/control and BUE integrated coordination activities. Feel pt will benefit from [facility] to facilitate return to PLOF.  Recommend pt follow up with orthotist regarding brace for L akle to reduce risk of falls.  Recommend pt follow up with her eye doctor as she reports visual changes prior to CVA (pt is diabetic).    Follow Up Recommendations  Home health OT;Supervision - Intermittent    Equipment Recommendations  None recommended by OT    Recommendations for Other Services       Precautions / Restrictions Precautions Precautions: Fall Restrictions Weight Bearing Restrictions: No      Mobility Bed Mobility Overal bed mobility: Modified Independent                Transfers Overall transfer level: Needs assistance Equipment used: Rolling walker (2 wheeled) Transfers: Sit to/from Stand Sit to Stand: Supervision              Balance Overall balance assessment: History of Falls (reports BLE gave out, denires dizziness prior to  event)                                         ADL either performed or assessed with clinical judgement   ADL Overall ADL's : Needs assistance/impaired Eating/Feeding: Set up;Sitting   Grooming: Set up;Supervision/safety;Standing   Upper Body Bathing: Set up;Sitting   Lower Body Bathing: Supervison/ safety;Set up;Sit to/from stand   Upper Body Dressing : Set up;Supervision/safety;Sitting   Lower Body Dressing: Supervision/safety;Set up;Sit to/from stand   Toilet Transfer: Supervision/safety;RW;BSC;Ambulation   Toileting- Water quality scientist and Hygiene: Supervision/safety;Sit to/from stand       Functional mobility during ADLs: Supervision/safety;Rolling walker       Vision Baseline Vision/History: Wears glasses Wears Glasses: Reading only Vision Assessment?: Yes Eye Alignment: Within Functional Limits Ocular Range of Motion: Within Functional Limits Alignment/Gaze Preference: Within Defined Limits Tracking/Visual Pursuits: Able to track stimulus in all quads without difficulty Saccades: Within functional limits Convergence: Within functional limits Visual Fields: Other (comment) (increased difficulty with superior quadrants) Additional Comments: recommend pt follow up wth  eye doctor as she is overdue for eye exam and is diabetic     Perception Perception Perception Tested?: Yes Comments: appears intact   Praxis Praxis Praxis tested?: Within functional limits    Pertinent Vitals/Pain Pain Assessment: No/denies pain     Hand Dominance Right   Extremity/Trunk Assessment Upper Extremity Assessment Upper Extremity Assessment: LUE deficits/detail LUE Deficits / Details: AROM WFL. Strength functional; decreased sensation;  decreased fine motor/coordination - unable to detect objects in hand; poor light touch; impaired proprioception LUE Sensation: decreased light touch;decreased proprioception LUE Coordination: decreased fine motor;decreased  gross motor   Lower Extremity Assessment Lower Extremity Assessment: Defer to PT evaluation LLE Deficits / Details: baseline decreased dorsiflexion from history of knee surgery with neurological complication LLE Coordination: decreased fine motor   Cervical / Trunk Assessment Cervical / Trunk Assessment: Normal (increased body habitus)   Communication Communication Communication: No difficulties   Cognition Arousal/Alertness: Awake/alert Behavior During Therapy: WFL for tasks assessed/performed Overall Cognitive Status: Impaired/Different from baseline Area of Impairment: Problem solving                             Problem Solving: Slow processing     General Comments       Exercises Exercises: Other exercises Other Exercises Other Exercises: in-hand manipulation tasks Other Exercises: BUE integrated coordination tasks   Shoulder Instructions      Home Living Family/patient expects to be discharged to:: Private residence Living Arrangements: Spouse/significant other Available Help at Discharge: Family Type of Home: House Home Access: Stairs to enter Technical brewer of Steps: 2 Entrance Stairs-Rails: None Home Layout: One level     Bathroom Shower/Tub: Occupational psychologist: Standard Bathroom Accessibility: Yes (sideways) How Accessible: Accessible via walker Home Equipment: Walker - 2 wheels;Crutches;Cane - single point;Shower seat;Grab bars - tub/shower;Grab bars - toilet          Prior Functioning/Environment Level of Independence: Independent with assistive device(s)        Comments: patient uses RW at home for mobility, ocassionally with furniture surf without device        OT Problem List: Decreased strength;Decreased coordination;Impaired sensation;Obesity      OT Treatment/Interventions: Self-care/ADL training;Neuromuscular education;Therapeutic exercise;Therapeutic activities;Patient/family education    OT  Goals(Current goals can be found in the care plan section) Acute Rehab OT Goals Patient Stated Goal: to go home/use my left arm better OT Goal Formulation: With patient Time For Goal Achievement: 03/29/17 Potential to Achieve Goals: Good  OT Frequency: Min 3X/week   Barriers to D/C:            Co-evaluation              AM-PAC PT "6 Clicks" Daily Activity     Outcome Measure Help from another person eating meals?: None Help from another person taking care of personal grooming?: None Help from another person toileting, which includes using toliet, bedpan, or urinal?: A Little Help from another person bathing (including washing, rinsing, drying)?: A Little Help from another person to put on and taking off regular upper body clothing?: None Help from another person to put on and taking off regular lower body clothing?: A Little 6 Click Score: 21   End of Session Equipment Utilized During Treatment: Rolling walker Nurse Communication: Mobility status  Activity Tolerance: Patient tolerated treatment well Patient left: in bed;with call bell/phone within reach;with family/visitor present  OT Visit Diagnosis: History of falling (Z91.81);Muscle weakness (generalized) (M62.81)                Time: 8099-8338 OT Time Calculation (min): 33 min Charges:  OT General Charges $OT Visit: 1 Procedure OT Evaluation $OT Eval Moderate Complexity: 1 Procedure OT Treatments $Self Care/Home Management : 8-22 mins G-Codes: OT G-codes **NOT FOR INPATIENT CLASS** Functional Assessment Tool Used: Clinical judgement Functional Limitation: Self care Self Care Current Status (S5053):  At least 1 percent but less than 20 percent impaired, limited or restricted Self Care Goal Status (H4388): At least 1 percent but less than 20 percent impaired, limited or restricted   [place], OT/L  [phone] 03/15/2017  Boysie Bonebrake,HILLARY 03/15/2017, 12:13 PM

## 2017-03-15 NOTE — Progress Notes (Signed)
[facility] PROGRESS NOTE    Progress Note  Rachel Black  KZS:[id number] DOB: [birth year] DOA: 03/14/2017 PCP: Ann Held, DO     Brief Narrative:   Rachel Black is an 66 y.o. female past medical history significant for essential hypertension, diabetes mellitus type 2 and hyperlipidemia who noticed the day before admission she started having left arm weakness. She called her husband who noticed her face history of seizures brought here into the ED. CT scan of the head showed a nonhemorrhagic cortical infarct in the right parietal lobe.  Assessment/Plan:   Acute CVA (cerebrovascular accident) [facility]): HgbA1c, fasting lipid panel  HDL < 40, LDL 45, on Lipitor. MRI, MRA of the brain without contrast: Acute infarct on the right lateral motor strip. Negative intracranial MRA. PT, OT, Speech consult pending Echocardiogram pending Carotid dopplers pending Prophylactic therapy-Antiplatelet med: Aspirin - dose 325 mg PO daily  No events on Cardiac Monitoring  Neurochecks q4h   DM2 (diabetes mellitus, type 2) ([place]): Last hemoglobin A1c was 5.4. Pending for this admission. Blood glucose 126 fasting.  Hyperlipidemia LDL goal <70 Continue Lipitor.  Severe obesity (BMI >= 40) (HCC)  Essential hypertension Allow permissive hypertension. Hold antihypertensive medication.  AKI (acute kidney injury) ([place]) With baseline creatinine around 1. Hold antihypertensive medication and ACE inhibitor. Start on IV fluids. Recheck a basic metabolic panel in the morning.    DVT prophylaxis: lovenox Family Communication:husband Disposition Plan/Barrier to D/C: home in am Code Status:     Code Status Orders        Start     Ordered   03/14/17 1219  Full code  Continuous     03/14/17 1218    Code Status History    Date Active Date Inactive Code Status Order ID Comments User Context   12/07/2016  5:08 AM 12/10/2016  9:35 PM Full Code [id number]  Etta Quill, DO ED   08/23/2015  6:04 PM 08/26/2015  6:45 PM Full Code [id number]  Mcarthur Rossetti, MD Inpatient   04/20/2014  8:36 PM 04/25/2014  9:37 PM Full Code [id number]  Mcarthur Rossetti, MD Inpatient        IV Access:    Peripheral IV   Procedures and diagnostic studies:   Ct Head Wo Contrast  Result Date: 03/14/2017 CLINICAL DATA:  LEFT arm weakness and numbness 2130 hrs last night, unable to hold anything in LEFT hand, keeps dropping everything in LEFT hand, history hypertension, diabetes mellitus EXAM: CT HEAD WITHOUT CONTRAST TECHNIQUE: Contiguous axial images were obtained from the base of the skull through the vertex without intravenous contrast. Sagittal and coronal MPR images reconstructed from axial data set. COMPARISON:  None FINDINGS: Brain: Normal ventricular morphology. No midline shift or mass effect. Small area of slightly decreased attenuation in the RIGHT parietal lobe consistent with acute infarct. No intracranial hemorrhage or mass lesion. No additional areas of infarction identified. Posterior fossa unremarkable. No extra-axial fluid collections. Vascular: Atherosclerotic calcification of internal carotid arteries bilaterally at skullbase. Skull: Intact Sinuses/Orbits: Clear Other: N/A IMPRESSION: Acute nonhemorrhagic cortical infarct at high RIGHT parietal lobe. Electronically Signed   By: Lavonia Dana M.D.   On: 03/14/2017 07:02   Mr Brain Wo Contrast  Result Date: 03/14/2017 CLINICAL DATA:  66 year old female with acute onset left arm weakness, slurred speech, left facial droop. EXAM: MRI HEAD WITHOUT CONTRAST MRA HEAD WITHOUT CONTRAST TECHNIQUE: Multiplanar, multiecho pulse sequences of the brain and surrounding structures were obtained without intravenous contrast. Angiographic images  of the head were obtained using MRA technique without contrast. COMPARISON:  Head CT 0634 hours today. FINDINGS: MRI HEAD FINDINGS Brain: Restricted diffusion along the lateral right motor strip  tracking to the right frontal operculum (series 9, image 17). Associated gyral edema. No associated hemorrhage or mass effect. No other restricted diffusion. No midline shift, mass effect, evidence of mass lesion, ventriculomegaly, extra-axial collection or acute intracranial hemorrhage. Cervicomedullary junction and pituitary are within normal limits. There is mild for age scattered nonspecific cerebral white matter T2 and FLAIR hyperintensity. There are several subtle chronic lacunar infarcts in the cerebellum (series 8, image 8). And there is mild nonspecific T2 heterogeneity in both globus pallidus. No cortical encephalomalacia or chronic cerebral blood products identified. Vascular: Major intracranial vascular flow voids are preserved. Skull and upper cervical spine: Negative. Visualized bone marrow signal is within normal limits. Sinuses/Orbits: Normal orbit soft tissues. Visualized paranasal sinuses and mastoids are stable and well pneumatized. Other: Visible internal auditory structures appear normal. Negative scalp soft tissues. MRA HEAD FINDINGS Antegrade flow in the posterior circulation with codominant distal vertebral arteries. No distal vertebral stenosis. Normal basilar artery. AICA, SCA, and PCA origins are normal (the left PCA origin is fetal type). The right posterior communicating artery is diminutive. Bilateral PCA branches are within normal limits. Antegrade flow in both ICA siphons. No siphon stenosis. Mildly tortuous cavernous ICAs. Ophthalmic and posterior communicating artery origins are normal. Patent carotid termini. Normal MCA and ACA origins. Anterior communicating artery and visible ACA branches are within normal limits. Left MCA branches are within normal limits. Right MCA M1 segment, right MCA bifurcation, and visible right MCA branches are within normal limits. No right MCA branch occlusion identified. IMPRESSION: 1. Acute infarct along the right lateral motor strip corresponding to  the noncontrast CT finding earlier today. Gyral cytotoxic edema but no associated hemorrhage or mass effect. 2.  Negative intracranial MRA. 3. Evidence of mild for age underlying small vessel disease. Electronically Signed   By: Genevie Ann M.D.   On: 03/14/2017 21:13   Mr Jodene Nam Head/brain KD Cm  Result Date: 03/14/2017 CLINICAL DATA:  66 year old female with acute onset left arm weakness, slurred speech, left facial droop. EXAM: MRI HEAD WITHOUT CONTRAST MRA HEAD WITHOUT CONTRAST TECHNIQUE: Multiplanar, multiecho pulse sequences of the brain and surrounding structures were obtained without intravenous contrast. Angiographic images of the head were obtained using MRA technique without contrast. COMPARISON:  Head CT 0634 hours today. FINDINGS: MRI HEAD FINDINGS Brain: Restricted diffusion along the lateral right motor strip tracking to the right frontal operculum (series 9, image 17). Associated gyral edema. No associated hemorrhage or mass effect. No other restricted diffusion. No midline shift, mass effect, evidence of mass lesion, ventriculomegaly, extra-axial collection or acute intracranial hemorrhage. Cervicomedullary junction and pituitary are within normal limits. There is mild for age scattered nonspecific cerebral white matter T2 and FLAIR hyperintensity. There are several subtle chronic lacunar infarcts in the cerebellum (series 8, image 8). And there is mild nonspecific T2 heterogeneity in both globus pallidus. No cortical encephalomalacia or chronic cerebral blood products identified. Vascular: Major intracranial vascular flow voids are preserved. Skull and upper cervical spine: Negative. Visualized bone marrow signal is within normal limits. Sinuses/Orbits: Normal orbit soft tissues. Visualized paranasal sinuses and mastoids are stable and well pneumatized. Other: Visible internal auditory structures appear normal. Negative scalp soft tissues. MRA HEAD FINDINGS Antegrade flow in the posterior circulation  with codominant distal vertebral arteries. No distal vertebral stenosis. Normal basilar artery. AICA,  SCA, and PCA origins are normal (the left PCA origin is fetal type). The right posterior communicating artery is diminutive. Bilateral PCA branches are within normal limits. Antegrade flow in both ICA siphons. No siphon stenosis. Mildly tortuous cavernous ICAs. Ophthalmic and posterior communicating artery origins are normal. Patent carotid termini. Normal MCA and ACA origins. Anterior communicating artery and visible ACA branches are within normal limits. Left MCA branches are within normal limits. Right MCA M1 segment, right MCA bifurcation, and visible right MCA branches are within normal limits. No right MCA branch occlusion identified. IMPRESSION: 1. Acute infarct along the right lateral motor strip corresponding to the noncontrast CT finding earlier today. Gyral cytotoxic edema but no associated hemorrhage or mass effect. 2.  Negative intracranial MRA. 3. Evidence of mild for age underlying small vessel disease. Electronically Signed   By: Genevie Ann M.D.   On: 03/14/2017 21:13     Medical Consultants:    None.  Anti-Infectives:   None  Subjective:    [name] she relates no new complaints.  Objective:    Vitals:   03/14/17 1821 03/14/17 2027 03/14/17 2300 03/15/17 0540  BP: 114/65 (!) 138/58 112/60 105/61  Pulse: 71 76 70 64  Resp: 20 18 20 16   Temp: 98.4 F (36.9 C) 98.6 F (37 C) 98.4 F (36.9 C) 98.2 F (36.8 C)  TempSrc: Oral Oral Oral Oral  SpO2: 99% 96% 100% 97%  Weight:      Height:       No intake or output data in the 24 hours ending 03/15/17 0726 Filed Weights   03/14/17 0546  Weight: 122 kg (269 lb)    Exam: General exam: No acute distress Respiratory system: Good air movement and clear to auscultation. Cardiovascular system: Regular rate and rhythm with positive S1-S2 no appreciated murmurs or gallops. Gastrointestinal system: Positive bowel sounds  soft nontender nondistended Central nervous system: Awake alert and oriented 3, 3-12 are grossly intact sensation is intact throughout muscle strength is 4 out of 5 on deltoid and bicep on the left upper extremity. Deep tendon reflexes are 2+ bilateral and symmetrical. No ataxia. Extremities: No pedal edema. Skin: No rashes, lesions or ulcers Psychiatry: Judgement and insight appear normal. Mood & affect appropriate.    Data Reviewed:    Labs: Basic Metabolic Panel:  Recent Labs Lab 03/14/17 0550 03/14/17 0644  NA 141 139  K 4.1 4.0  CL 107 107  CO2 23  --   GLUCOSE 131* 126*  BUN 29* 28*  CREATININE 1.35* 1.40*  CALCIUM 10.2  --    GFR Estimated Creatinine Clearance: 54.1 mL/min (A) (by C-G formula based on SCr of 1.4 mg/dL (H)). Liver Function Tests:  Recent Labs Lab 03/14/17 0550  AST 31  ALT 20  ALKPHOS 32*  BILITOT 0.4  PROT 6.4*  ALBUMIN 3.9   No results for input(s): LIPASE, AMYLASE in the last 168 hours. No results for input(s): AMMONIA in the last 168 hours. Coagulation profile  Recent Labs Lab 03/14/17 0550  INR 0.96    CBC:  Recent Labs Lab 03/14/17 0550 03/14/17 0644  WBC 5.3  --   NEUTROABS 3.5  --   HGB 11.2* 11.6*  HCT 34.7* 34.0*  MCV 88.3  --   PLT 99*  --    Cardiac Enzymes: No results for input(s): CKTOTAL, CKMB, CKMBINDEX, TROPONINI in the last 168 hours. BNP (last 3 results) No results for input(s): PROBNP in the last 8760 hours. CBG: No  results for input(s): GLUCAP in the last 168 hours. D-Dimer: No results for input(s): DDIMER in the last 72 hours. Hgb A1c: No results for input(s): HGBA1C in the last 72 hours. Lipid Profile:  Recent Labs  03/15/17 0402  CHOL 125  HDL 23*  LDLCALC 45  TRIG 284*  CHOLHDL 5.4   Thyroid function studies: No results for input(s): TSH, T4TOTAL, T3FREE, THYROIDAB in the last 72 hours.  Invalid input(s): FREET3 Anemia work up: No results for input(s): VITAMINB12, FOLATE,  FERRITIN, TIBC, IRON, RETICCTPCT in the last 72 hours. Sepsis Labs:  Recent Labs Lab 03/14/17 0550  WBC 5.3   Microbiology No results found for this or any previous visit (from the past 240 hour(s)).   Medications:   . allopurinol  300 mg Oral BID  . aspirin  300 mg Rectal Daily   Or  . aspirin  325 mg Oral Daily  . famotidine  20 mg Oral QHS  . fenofibrate  160 mg Oral Daily  . ferrous sulfate  325 mg Oral BID  . irbesartan  75 mg Oral Daily   And  . hydrochlorothiazide  12.5 mg Oral Daily  . pantoprazole  40 mg Oral Daily  . rosuvastatin  20 mg Oral Daily   Continuous Infusions: . sodium chloride 75 mL/hr at 03/14/17 1607    Time spent: 25 min   LOS: 0 days   Charlynne Cousins  [facility] Pager [phone]  *Please refer to [email or web address], password [email or web address] to get updated schedule on who will round on this patient, as hospitalists switch teams weekly. If 7PM-7AM, please contact night-coverage at [email or web address], password [email or web address] for any overnight needs.  03/15/2017, 7:26 AM

## 2017-03-15 NOTE — Consult Note (Signed)
Referring Physician: Dr. Aileen Fass    Chief Complaint: Left arm numbness and weakness  HPI: Rachel Black is an 66 y.o. female who presented to the [facility] ED with left arm numbness and weakness which started at about 9:30 PM on Saturday night. She stated that she had been unable to hold objects in her left hand, consistently dropping them. She also had some slurred speech and left facial droop at symptom onset, per husband.    CT head at [facility] showed a subacute ischemic infarction involving the right postcentral gyrus. She was given a dose of ASA and transferred to [facility].   Her PMHx includes DM, gout, history of rheumatic fever as a child, HLD, and HTN.   She takes Crestor at home but is not on an antiplatelet medication or anticoagulant. She gets stomach upset with non-enteric coated aspirin.   Past Medical History:  Diagnosis Date  . Arthritis    PAIN AND OA BOTH KNEES AND SHOULDERS AND ELBOWS AND WRIST  . Blood transfusion without reported diagnosis 2018   after cholecystectomy  . Diabetes mellitus    ORAL MEDICATION  . GERD (gastroesophageal reflux disease)   . Gout    NO RECENT FLARE UPS  . H/O: rheumatic fever    AS A CHILD - NO KNOWN HEART MURMUR OR HEART PROBLEMS  . Hyperlipidemia   . Hypertension   . Shortness of breath    WITH EXERTION    Past Surgical History:  Procedure Laterality Date  . ABDOMINAL HYSTERECTOMY    . CESAREAN SECTION    . CHOLECYSTECTOMY N/A 12/09/2016   Procedure: LAPAROSCOPIC CHOLECYSTECTOMY WITH INTRAOPERATIVE CHOLANGIOGRAM;  Surgeon: Stark Klein, MD;  Location: [facility];  Service: General;  Laterality: N/A;  . CHOLECYSTECTOMY  12/09/2016  . COLONOSCOPY WITH PROPOFOL N/A 01/30/2013   Procedure: COLONOSCOPY WITH PROPOFOL;  Surgeon: Irene Shipper, MD;  Location: [facility];  Service: Endoscopy;  Laterality: N/A;  . ERCP N/A 12/08/2016   Procedure: ENDOSCOPIC RETROGRADE CHOLANGIOPANCREATOGRAPHY (ERCP);  Surgeon: Ladene Artist, MD;  Location: [facility];  Service: Endoscopy;  Laterality: N/A;  . ESOPHAGOGASTRODUODENOSCOPY (EGD) WITH PROPOFOL N/A 01/30/2013   Procedure: ESOPHAGOGASTRODUODENOSCOPY (EGD) WITH PROPOFOL;  Surgeon: Irene Shipper, MD;  Location: [facility];  Service: Endoscopy;  Laterality: N/A;  . Gallstone Surgeery  12/08/2016  . PARTIAL HYSTERECTOMY    . TOTAL KNEE ARTHROPLASTY Right 04/20/2014   Procedure: RIGHT TOTAL KNEE ARTHROPLASTY CONVERTED TO RIGHT KNEE REIMPLANTATION;  Surgeon: Mcarthur Rossetti, MD;  Location: [facility];  Service: Orthopedics;  Laterality: Right;  . TOTAL KNEE ARTHROPLASTY Left 08/23/2015   Procedure: LEFT TOTAL KNEE ARTHROPLASTY;  Surgeon: Mcarthur Rossetti, MD;  Location: [facility];  Service: Orthopedics;  Laterality: Left;  Marland Kitchen VENTRAL HERNIA REPAIR     x2    Family History  Problem Relation Age of Onset  . Diabetes Mother   . Hypertension Mother        entire family  . Stroke Father        CVA  . Hyperlipidemia Father        entire family  . Diabetes Father   . Heart disease Father   . Crohn's disease Son   . Irritable bowel syndrome Son    Social History:  reports that she has never smoked. She has never used smokeless tobacco. She reports that she does not drink alcohol or use drugs.  Allergies:  Allergies  Allergen Reactions  . Aspirin     Regular ASA-stomach  upset  . Other     SOME BANDAIDS CAUSE SKIN IRRITATION  . Penicillins Itching    Has patient had a PCN reaction causing immediate rash, facial/tongue/throat swelling, SOB or lightheadedness with hypotension: No Has patient had a PCN reaction causing severe rash involving mucus membranes or skin necrosis: No Has patient had a PCN reaction that required hospitalization No Has patient had a PCN reaction occurring within the last 10 years: No If all of the above answers are "NO", then may proceed with Cephalosporin use.     Medications:  Prior to Admission:  Facility-Administered Medications Prior to Admission   Medication Dose Route Frequency Provider Last Rate Last Dose  . [DISCONTINUED] 0.9 %  sodium chloride infusion  500 mL Intravenous Continuous Irene Shipper, MD       Prescriptions Prior to Admission  Medication Sig Dispense Refill Last Dose  . allopurinol (ZYLOPRIM) 300 MG tablet TAKE 1 TABLET BY MOUTH 2 TIMES DAILY 180 tablet 0 03/13/2017 at Unknown time  . calcium carbonate (TUMS - DOSED IN MG ELEMENTAL CALCIUM) 500 MG chewable tablet Chew 2 tablets by mouth 3 (three) times daily as needed for indigestion or heartburn.   03/13/2017 at Unknown time  . calcium-vitamin D (OSCAL WITH D) 500-200 MG-UNIT per tablet Take 1 tablet by mouth daily with breakfast.    03/13/2017 at Unknown time  . DEXILANT 60 MG capsule TAKE 1 CAPSULE (60 MG TOTAL) BY MOUTH EVERY MORNING 90 capsule 3 03/13/2017 at Unknown time  . fenofibrate 160 MG tablet TAKE 1 TABLET (160 MG TOTAL) BY MOUTH DAILY. 90 tablet 1 03/13/2017 at Unknown time  . ferrous sulfate 325 (65 FE) MG EC tablet Take 1 tablet (325 mg total) by mouth 2 (two) times daily. 60 tablet 11 03/13/2017 at Unknown time  . JANUMET XR [phone] MG TB24 TAKE 1 TABLET BY MOUTH EVERY DAY 90 tablet 1 03/13/2017 at Unknown time  . potassium chloride SA (K-DUR,KLOR-CON) 20 MEQ tablet Take 1 tablet (20 mEq total) by mouth daily. 90 tablet 1 03/13/2017 at Unknown time  . ranitidine (ZANTAC) 150 MG tablet TAKE 1 TABLET (150 MG TOTAL) BY MOUTH AT BEDTIME. (Patient taking differently: Take 150 mg by mouth 2 (two) times daily. ) 30 tablet 3 03/13/2017 at Unknown time  . rosuvastatin (CRESTOR) 20 MG tablet TAKE 1 TABLET (20 MG TOTAL) BY MOUTH DAILY. 90 tablet 1 03/13/2017 at Unknown time  . valsartan-hydrochlorothiazide (DIOVAN-HCT) 80-12.5 MG tablet TAKE 1 TABLET BY MOUTH DAILY. 90 tablet 0 03/13/2017 at 0900  . ONE TOUCH ULTRA TEST test strip ONETOUCH ULTRA BLUE TEST STRIPS CHECK BLOOD SUGAR DAILY. DX: E11.9 100 each 12 02/15/2017  . ONETOUCH DELICA LANCETS FINE MISC Check glucose qd 100  each 12 02/15/2017   Scheduled: . allopurinol  300 mg Oral BID  . aspirin  300 mg Rectal Daily   Or  . aspirin  325 mg Oral Daily  . famotidine  20 mg Oral QHS  . fenofibrate  160 mg Oral Daily  . ferrous sulfate  325 mg Oral BID  . irbesartan  75 mg Oral Daily   And  . hydrochlorothiazide  12.5 mg Oral Daily  . pantoprazole  40 mg Oral Daily  . rosuvastatin  20 mg Oral Daily   Continuous: . sodium chloride 75 mL/hr at 03/14/17 1607   KNL:[id number] **OR** acetaminophen (TYLENOL) oral liquid 160 mg/5 mL **OR** acetaminophen, senna-docusate  ROS: At time of her presentation, she had no complaint of fever, SOB,  CP, vomiting or headaches. Positive for chronic left foot drop and chronic bilateral knee pain in the context of knee replacements.   Physical Examination: Blood pressure 112/60, pulse 70, temperature 98.4 F (36.9 C), temperature source Oral, resp. rate 20, height 5' 6"  (1.676 m), weight 122 kg (269 lb), SpO2 100 %.  HEENT: [place]/AT Lungs: Respirations unlabored Ext: No edema. Left foot drop noted.   Neurologic Examination: Mental Status: Alert, oriented, thought content appropriate.  Speech fluent without evidence of aphasia.  Able to follow all commands without difficulty. No dysarthria noted. Cranial Nerves: II:  Visual fields intact. PERRL.  III,IV, VI: ptosis not present, EOMI without nystagmus V,VII: smile symmetric, facial temp sensation normal bilaterally VIII: hearing intact to conversation IX,X: no hypophonia XI: symmetric XII: midline tongue extension  Motor: RUE: 5/5 proximal and distal RLE: 5/5 proximal and distal LUE: 4/5 deltoid, 5/5 biceps/triceps, 4+/5 grip LLE: 5/5 proximal and distal except for 2/5 left ankle dorsiflexion secondary to chronic left foot drop Subtle left pronator drift.  Sensory: Temperature and light touch intact x 4. No extinction.  Deep Tendon Reflexes:  2+ bilateral biceps and brachioradialis. Patellar reflexes deferred due  to chronic knee pain.  Achilles 0 bilaterally Cerebellar: No ataxia with FNF bilaterally Gait: Deferred  Results for orders placed or performed during the hospital encounter of 03/14/17 (from the past 48 hour(s))  Protime-INR     Status: None   Collection Time: 03/14/17  5:50 AM  Result Value Ref Range   Prothrombin Time 12.8 11.4 - 15.2 seconds   INR 0.96   APTT     Status: None   Collection Time: 03/14/17  5:50 AM  Result Value Ref Range   aPTT 26 24 - 36 seconds  CBC     Status: Abnormal   Collection Time: 03/14/17  5:50 AM  Result Value Ref Range   WBC 5.3 4.0 - 10.5 K/uL   RBC 3.93 3.87 - 5.11 MIL/uL   Hemoglobin 11.2 (L) 12.0 - 15.0 g/dL   HCT 34.7 (L) 36.0 - 46.0 %   MCV 88.3 78.0 - 100.0 fL   MCH 28.5 26.0 - 34.0 pg   MCHC 32.3 30.0 - 36.0 g/dL   RDW 17.1 (H) 11.5 - 15.5 %   Platelets 99 (L) 150 - 400 K/uL    Comment: CONSISTENT WITH PREVIOUS RESULT  Differential     Status: None   Collection Time: 03/14/17  5:50 AM  Result Value Ref Range   Neutrophils Relative % 67 %   Neutro Abs 3.5 1.7 - 7.7 K/uL   Lymphocytes Relative 26 %   Lymphs Abs 1.4 0.7 - 4.0 K/uL   Monocytes Relative 7 %   Monocytes Absolute 0.3 0.1 - 1.0 K/uL   Eosinophils Relative 0 %   Eosinophils Absolute 0.0 0.0 - 0.7 K/uL   Basophils Relative 0 %   Basophils Absolute 0.0 0.0 - 0.1 K/uL  Comprehensive metabolic panel     Status: Abnormal   Collection Time: 03/14/17  5:50 AM  Result Value Ref Range   Sodium 141 135 - 145 mmol/L   Potassium 4.1 3.5 - 5.1 mmol/L   Chloride 107 101 - 111 mmol/L   CO2 23 22 - 32 mmol/L   Glucose, Bld 131 (H) 65 - 99 mg/dL   BUN 29 (H) 6 - 20 mg/dL   Creatinine, Ser 1.35 (H) 0.44 - 1.00 mg/dL   Calcium 10.2 8.9 - 10.3 mg/dL   Total Protein 6.4 (L) 6.5 -  8.1 g/dL   Albumin 3.9 3.5 - 5.0 g/dL   AST 31 15 - 41 U/L   ALT 20 14 - 54 U/L   Alkaline Phosphatase 32 (L) 38 - 126 U/L   Total Bilirubin 0.4 0.3 - 1.2 mg/dL   GFR calc non Af Amer 41 (L) >60 mL/min    GFR calc Af Amer 47 (L) >60 mL/min    Comment: (NOTE) The eGFR has been calculated using the CKD EPI equation. This calculation has not been validated in all clinical situations. eGFR's persistently <60 mL/min signify possible Chronic Kidney Disease.    Anion gap 11 5 - 15  I-stat troponin, ED     Status: None   Collection Time: 03/14/17  6:42 AM  Result Value Ref Range   Troponin i, poc 0.01 0.00 - 0.08 ng/mL   Comment 3            Comment: Due to the release kinetics of cTnI, a negative result within the first hours of the onset of symptoms does not rule out myocardial infarction with certainty. If myocardial infarction is still suspected, repeat the test at appropriate intervals.   I-Stat Chem 8, ED     Status: Abnormal   Collection Time: 03/14/17  6:44 AM  Result Value Ref Range   Sodium 139 135 - 145 mmol/L   Potassium 4.0 3.5 - 5.1 mmol/L   Chloride 107 101 - 111 mmol/L   BUN 28 (H) 6 - 20 mg/dL   Creatinine, Ser 1.40 (H) 0.44 - 1.00 mg/dL   Glucose, Bld 126 (H) 65 - 99 mg/dL   Calcium, Ion 1.26 1.15 - 1.40 mmol/L   TCO2 21 0 - 100 mmol/L   Hemoglobin 11.6 (L) 12.0 - 15.0 g/dL   HCT 34.0 (L) 36.0 - 46.0 %   Ct Head Wo Contrast  Result Date: 03/14/2017 CLINICAL DATA:  LEFT arm weakness and numbness 2130 hrs last night, unable to hold anything in LEFT hand, keeps dropping everything in LEFT hand, history hypertension, diabetes mellitus EXAM: CT HEAD WITHOUT CONTRAST TECHNIQUE: Contiguous axial images were obtained from the base of the skull through the vertex without intravenous contrast. Sagittal and coronal MPR images reconstructed from axial data set. COMPARISON:  None FINDINGS: Brain: Normal ventricular morphology. No midline shift or mass effect. Small area of slightly decreased attenuation in the RIGHT parietal lobe consistent with acute infarct. No intracranial hemorrhage or mass lesion. No additional areas of infarction identified. Posterior fossa unremarkable. No  extra-axial fluid collections. Vascular: Atherosclerotic calcification of internal carotid arteries bilaterally at skullbase. Skull: Intact Sinuses/Orbits: Clear Other: N/A IMPRESSION: Acute nonhemorrhagic cortical infarct at high RIGHT parietal lobe. Electronically Signed   By: Lavonia Dana M.D.   On: 03/14/2017 07:02   Mr Brain Wo Contrast  Result Date: 03/14/2017 CLINICAL DATA:  66 year old female with acute onset left arm weakness, slurred speech, left facial droop. EXAM: MRI HEAD WITHOUT CONTRAST MRA HEAD WITHOUT CONTRAST TECHNIQUE: Multiplanar, multiecho pulse sequences of the brain and surrounding structures were obtained without intravenous contrast. Angiographic images of the head were obtained using MRA technique without contrast. COMPARISON:  Head CT 0634 hours today. FINDINGS: MRI HEAD FINDINGS Brain: Restricted diffusion along the lateral right motor strip tracking to the right frontal operculum (series 9, image 17). Associated gyral edema. No associated hemorrhage or mass effect. No other restricted diffusion. No midline shift, mass effect, evidence of mass lesion, ventriculomegaly, extra-axial collection or acute intracranial hemorrhage. Cervicomedullary junction and pituitary are within  normal limits. There is mild for age scattered nonspecific cerebral white matter T2 and FLAIR hyperintensity. There are several subtle chronic lacunar infarcts in the cerebellum (series 8, image 8). And there is mild nonspecific T2 heterogeneity in both globus pallidus. No cortical encephalomalacia or chronic cerebral blood products identified. Vascular: Major intracranial vascular flow voids are preserved. Skull and upper cervical spine: Negative. Visualized bone marrow signal is within normal limits. Sinuses/Orbits: Normal orbit soft tissues. Visualized paranasal sinuses and mastoids are stable and well pneumatized. Other: Visible internal auditory structures appear normal. Negative scalp soft tissues. MRA HEAD  FINDINGS Antegrade flow in the posterior circulation with codominant distal vertebral arteries. No distal vertebral stenosis. Normal basilar artery. AICA, SCA, and PCA origins are normal (the left PCA origin is fetal type). The right posterior communicating artery is diminutive. Bilateral PCA branches are within normal limits. Antegrade flow in both ICA siphons. No siphon stenosis. Mildly tortuous cavernous ICAs. Ophthalmic and posterior communicating artery origins are normal. Patent carotid termini. Normal MCA and ACA origins. Anterior communicating artery and visible ACA branches are within normal limits. Left MCA branches are within normal limits. Right MCA M1 segment, right MCA bifurcation, and visible right MCA branches are within normal limits. No right MCA branch occlusion identified. IMPRESSION: 1. Acute infarct along the right lateral motor strip corresponding to the noncontrast CT finding earlier today. Gyral cytotoxic edema but no associated hemorrhage or mass effect. 2.  Negative intracranial MRA. 3. Evidence of mild for age underlying small vessel disease. Electronically Signed   By: Genevie Ann M.D.   On: 03/14/2017 21:13   Mr Jodene Nam Head/brain OQ Cm  Result Date: 03/14/2017 CLINICAL DATA:  66 year old female with acute onset left arm weakness, slurred speech, left facial droop. EXAM: MRI HEAD WITHOUT CONTRAST MRA HEAD WITHOUT CONTRAST TECHNIQUE: Multiplanar, multiecho pulse sequences of the brain and surrounding structures were obtained without intravenous contrast. Angiographic images of the head were obtained using MRA technique without contrast. COMPARISON:  Head CT 0634 hours today. FINDINGS: MRI HEAD FINDINGS Brain: Restricted diffusion along the lateral right motor strip tracking to the right frontal operculum (series 9, image 17). Associated gyral edema. No associated hemorrhage or mass effect. No other restricted diffusion. No midline shift, mass effect, evidence of mass lesion, ventriculomegaly,  extra-axial collection or acute intracranial hemorrhage. Cervicomedullary junction and pituitary are within normal limits. There is mild for age scattered nonspecific cerebral white matter T2 and FLAIR hyperintensity. There are several subtle chronic lacunar infarcts in the cerebellum (series 8, image 8). And there is mild nonspecific T2 heterogeneity in both globus pallidus. No cortical encephalomalacia or chronic cerebral blood products identified. Vascular: Major intracranial vascular flow voids are preserved. Skull and upper cervical spine: Negative. Visualized bone marrow signal is within normal limits. Sinuses/Orbits: Normal orbit soft tissues. Visualized paranasal sinuses and mastoids are stable and well pneumatized. Other: Visible internal auditory structures appear normal. Negative scalp soft tissues. MRA HEAD FINDINGS Antegrade flow in the posterior circulation with codominant distal vertebral arteries. No distal vertebral stenosis. Normal basilar artery. AICA, SCA, and PCA origins are normal (the left PCA origin is fetal type). The right posterior communicating artery is diminutive. Bilateral PCA branches are within normal limits. Antegrade flow in both ICA siphons. No siphon stenosis. Mildly tortuous cavernous ICAs. Ophthalmic and posterior communicating artery origins are normal. Patent carotid termini. Normal MCA and ACA origins. Anterior communicating artery and visible ACA branches are within normal limits. Left MCA branches are within normal limits. Right MCA  M1 segment, right MCA bifurcation, and visible right MCA branches are within normal limits. No right MCA branch occlusion identified. IMPRESSION: 1. Acute infarct along the right lateral motor strip corresponding to the noncontrast CT finding earlier today. Gyral cytotoxic edema but no associated hemorrhage or mass effect. 2.  Negative intracranial MRA. 3. Evidence of mild for age underlying small vessel disease. Electronically Signed   By: Genevie Ann M.D.   On: 03/14/2017 21:13    Assessment: 66 y.o. female with subacute ischemic infarction involving the right postcentral gyrus 1. MRI brain: Official report states that there is an acute infarct along the right lateral motor strip. Based upon my review of the images, this appears more likely to be within the sensory strip.   2. MRA head is normal.  3. EKG revealed sinus tachycardia, without atrial fibrillation or flutter.  4. Stroke Risk Factors - DM, history of rheumatic fever as a child, HLD, and HTN.  Plan: 1. Carotid ultrasound 2. TTE 3. PT consult, OT consult, Speech consult 4. ECASA 325 mg po qd  5. Switch rosuvastatin to Lipitor 40 mg po qd 6. Telemetry monitoring 7. Frequent neuro checks 8. HgbA1c 9. BP management  @Electronically  signed: Dr. Kerney Elbe  03/15/2017, 5:25 AM

## 2017-03-16 ENCOUNTER — Encounter (HOSPITAL_COMMUNITY): Payer: Self-pay | Admitting: *Deleted

## 2017-03-16 ENCOUNTER — Encounter (HOSPITAL_COMMUNITY)
Admission: EM | Disposition: A | Discharge status: Home / Self Care | Payer: Self-pay | Source: Home / Self Care | Attending: Internal Medicine

## 2017-03-16 ENCOUNTER — Inpatient Hospital Stay (HOSPITAL_COMMUNITY): Payer: BLUE CROSS/BLUE SHIELD

## 2017-03-16 DIAGNOSIS — Q211 Atrial septal defect: Secondary | ICD-10-CM

## 2017-03-16 DIAGNOSIS — I638 Other cerebral infarction: Secondary | ICD-10-CM

## 2017-03-16 DIAGNOSIS — Q2112 Patent foramen ovale: Secondary | ICD-10-CM

## 2017-03-16 HISTORY — PX: TEE WITHOUT CARDIOVERSION: SHX5443

## 2017-03-16 LAB — BASIC METABOLIC PANEL
Anion gap: 8 (ref 5–15)
BUN: 18 mg/dL (ref 6–20)
CHLORIDE: 107 mmol/L (ref 101–111)
CO2: 23 mmol/L (ref 22–32)
CREATININE: 1.2 mg/dL — AB (ref 0.44–1.00)
Calcium: 9.4 mg/dL (ref 8.9–10.3)
GFR calc non Af Amer: 47 mL/min — ABNORMAL LOW (ref >60)
GFR, EST AFRICAN AMERICAN: 54 mL/min — AB (ref >60)
Glucose, Bld: 110 mg/dL — ABNORMAL HIGH (ref 65–99)
POTASSIUM: 3.3 mmol/L — AB (ref 3.5–5.1)
Sodium: 138 mmol/L (ref 135–145)

## 2017-03-16 LAB — CBC
HEMATOCRIT: 32.5 % — AB (ref 36.0–46.0)
HEMOGLOBIN: 10.3 g/dL — AB (ref 12.0–15.0)
MCH: 28.2 pg (ref 26.0–34.0)
MCHC: 31.7 g/dL (ref 30.0–36.0)
MCV: 89 fL (ref 78.0–100.0)
PLATELETS: 94 10*3/uL — AB (ref 150–400)
RBC: 3.65 MIL/uL — AB (ref 3.87–5.11)
RDW: 16.9 % — ABNORMAL HIGH (ref 11.5–15.5)
WBC: 4.8 10*3/uL (ref 4.0–10.5)

## 2017-03-16 LAB — HEMOGLOBIN A1C
Hgb A1c MFr Bld: 5.4 % (ref 4.8–5.6)
Mean Plasma Glucose: 108 mg/dL

## 2017-03-16 LAB — GLUCOSE, CAPILLARY
GLUCOSE-CAPILLARY: 136 mg/dL — AB (ref 65–99)
Glucose-Capillary: 110 mg/dL — ABNORMAL HIGH (ref 65–99)
Glucose-Capillary: 102 mg/dL — ABNORMAL HIGH (ref 65–99)
Glucose-Capillary: 125 mg/dL — ABNORMAL HIGH (ref 65–99)

## 2017-03-16 SURGERY — ECHOCARDIOGRAM, TRANSESOPHAGEAL
Anesthesia: Moderate Sedation

## 2017-03-16 MED ORDER — MIDAZOLAM HCL 10 MG/2ML IJ SOLN
INTRAMUSCULAR | Status: DC | PRN
  Administered 2017-03-16 (×2): 2 mg via INTRAVENOUS

## 2017-03-16 MED ORDER — BUTAMBEN-TETRACAINE-BENZOCAINE 2-2-14 % EX AERO
INHALATION_SPRAY | CUTANEOUS | Status: DC | PRN
  Administered 2017-03-16: 2 via TOPICAL

## 2017-03-16 MED ORDER — SODIUM CHLORIDE 0.9 % IV SOLN
INTRAVENOUS | Status: DC
  Administered 2017-03-16: 200 mL via INTRAVENOUS

## 2017-03-16 MED ORDER — FENTANYL CITRATE (PF) 100 MCG/2ML IJ SOLN
INTRAMUSCULAR | Status: DC | PRN
  Administered 2017-03-16 (×2): 25 ug via INTRAVENOUS

## 2017-03-16 MED ORDER — FENTANYL CITRATE (PF) 100 MCG/2ML IJ SOLN
INTRAMUSCULAR | Status: AC
  Filled 2017-03-16: qty 2

## 2017-03-16 MED ORDER — MIDAZOLAM HCL 5 MG/ML IJ SOLN
INTRAMUSCULAR | Status: AC
  Filled 2017-03-16: qty 2

## 2017-03-16 MED ORDER — POTASSIUM CHLORIDE CRYS ER 20 MEQ PO TBCR
40.0000 meq | EXTENDED_RELEASE_TABLET | Freq: Two times a day (BID) | ORAL | Status: AC
  Administered 2017-03-16 (×2): 40 meq via ORAL
  Filled 2017-03-16 (×2): qty 2

## 2017-03-16 NOTE — Progress Notes (Signed)
STROKE TEAM PROGRESS NOTE   HISTORY OF PRESENT ILLNESS (per record) Rachel Black is a 66 y.o. female with a history of rheumatic fever, hypertension (inconsistently takes her blood pressure medicine), diabetes mellitus and hyperlipidemia who presented with left arm weakness and slurred speech on 03/14/2017.  The patient noted that she kept dropping objects with her left hand, and her husband noted that she was slurring her words.  They waited to see if her symptoms would improve, and drove to the ED on the morning of 03/14/2017 when they did not.  Of note, the patient has as left foot drop at baseline as a complication of her bilateral knee arthoplasty, and was using a walker at home prior to admission.  That patient underwent laparoscopic cholecystectomy in March 2018.  CT head with acute high right cortical parietal infarct.  MR with right lateral motor strip infarct with cytotoxic edema.  Negative MRA and CTA head.   Patient was not administered IV t-PA secondary to arriving outside of the treatment window. She was admitted to General Neurology for further evaluation and treatment.   SUBJECTIVE (INTERVAL HISTORY) Her husband is at the bedside. No neuro change. Pending TEE at 1500 today.  No DVT on LE venous Doppler.    OBJECTIVE Temp:  [97.7 F (36.5 C)-98.7 F (37.1 C)] 97.7 F (36.5 C) (07/03 0800) Pulse Rate:  [64-74] 74 (07/03 0800) Cardiac Rhythm: Sinus bradycardia;Heart block (07/03 0700) Resp:  [16-20] 20 (07/03 0800) BP: (104-125)/(42-98) 114/61 (07/03 0800) SpO2:  [98 %-100 %] 98 % (07/03 0458)  CBC:   Recent Labs Lab 03/14/17 0550 03/14/17 0644 03/16/17 0357  WBC 5.3  --  4.8  NEUTROABS 3.5  --   --   HGB 11.2* 11.6* 10.3*  HCT 34.7* 34.0* 32.5*  MCV 88.3  --  89.0  PLT 99*  --  94*    Basic Metabolic Panel:   Recent Labs Lab 03/14/17 0550 03/14/17 0644 03/16/17 0357  NA 141 139 138  K 4.1 4.0 3.3*  CL 107 107 107  CO2 23  --  23  GLUCOSE 131* 126* 110*   BUN 29* 28* 18  CREATININE 1.35* 1.40* 1.20*  CALCIUM 10.2  --  9.4    Lipid Panel:     Component Value Date/Time   CHOL 125 03/15/2017 0402   TRIG 284 (H) 03/15/2017 0402   HDL 23 (L) 03/15/2017 0402   CHOLHDL 5.4 03/15/2017 0402   VLDL 57 (H) 03/15/2017 0402   LDLCALC 45 03/15/2017 0402   HgbA1c:  Lab Results  Component Value Date   HGBA1C 5.4 03/15/2017   Urine Drug Screen:     Component Value Date/Time   LABOPIA NONE DETECTED 03/15/2017 1328   COCAINSCRNUR NONE DETECTED 03/15/2017 1328   LABBENZ NONE DETECTED 03/15/2017 1328   AMPHETMU NONE DETECTED 03/15/2017 1328   THCU NONE DETECTED 03/15/2017 1328   LABBARB NONE DETECTED 03/15/2017 1328    Alcohol Level No results found for: [place] I have personally reviewed the radiological images below and agree with the radiology interpretations.  Ct Head Wo Contrast 03/14/2017 IMPRESSION: Acute nonhemorrhagic cortical infarct at high RIGHT parietal lobe.  Ct Angio Neck W Or Wo Contrast 03/15/2017 IMPRESSION: Negative CTA of the neck. No stenosis, atherosclerosis, or embolic source.  Mr Brain 59 Contrast Mr Jodene Nam Head/brain Wo Cm 03/14/2017 IMPRESSION: 1. Acute infarct along the right lateral motor strip corresponding to the noncontrast CT finding earlier. Gyral cytotoxic edema but no associated hemorrhage  or mass effect. 2.  Negative intracranial MRA. 3. Evidence of mild for age underlying small vessel disease.  TTE - Left ventricle: The cavity size was normal. Wall thickness was normal. Systolic function was normal. The estimated ejection fraction was in the range of 55% to 60%. Doppler parameters are consistent with abnormal left ventricular relaxation (grade 1 diastolic dysfunction).  LE venous Doppler 03/15/2017 No evidence of deep vein or superficial thrombosis involving the right lower extremity and left lower extremity.  TEE 03/16/2017 Left Ventrical:  Normal LV  Mitral Valve: normal  Aortic Valve:  normal Tricuspid Valve: normal  Pulmonic Valve: poorly visualized  Left Atrium/ Left atrial appendage: small,  No thrombus  Atrial septum: + PFO by bubble study  Aorta: mild atherosclerosis    PHYSICAL EXAM  Temp:  [97.7 F (36.5 C)-98.7 F (37.1 C)] 97.7 F (36.5 C) (07/03 0800) Pulse Rate:  [64-74] 74 (07/03 0800) Resp:  [16-20] 20 (07/03 0800) BP: (104-125)/(42-98) 114/61 (07/03 0800) SpO2:  [98 %-100 %] 98 % (07/03 0458)  General - Well nourished, well developed, in no apparent distress.  Ophthalmologic - Sharp disc margins OU.  Cardiovascular - Regular rate and rhythm.  Mental Status -  Level of arousal and orientation to time, place, and person were intact. Language including expression, naming, repetition, comprehension was assessed and found intact. Fund of Knowledge was assessed and was intact.  Cranial Nerves II - XII - II - Visual field intact OU. III, IV, VI - Extraocular movements intact. V - Facial sensation intact bilaterally. VII - subtle left nasolabial fold flattening. VIII - Hearing & vestibular intact bilaterally. X - Palate elevates symmetrically. XI - Chin turning & shoulder shrug intact bilaterally. XII - Tongue protrusion intact.  Motor Strength - The patient's strength was normal in all extremities except left hand mild dexterity difficulties, and chronic left foot drop with DF 3-/5 and pronator drift was absent.  Bulk was normal and fasciculations were absent.   Motor Tone - Muscle tone was assessed at the neck and appendages and was normal.  Reflexes - The patient's reflexes were 1+ in all extremities and she had no pathological reflexes.  Sensory - Light touch, temperature/pinprick were assessed and were symmetrical.    Coordination - The patient had normal movements in the hands with no ataxia or dysmetria.  Tremor was absent.  Gait and Station - walks with walker, slow, but steady gait.   ASSESSMENT/PLAN Rachel Black is a 66  y.o. female with history of rheumatic fever, hypertension (inconsistently takes her blood pressure medicine), diabetes mellitus and hyperlipidemia presenting with left arm weakness and slurred speech. She did not receive IV t-PA due to arriving outside of the treatment window.   Stroke: Acute right MCA cortical infarct likely embolic, etiology unclear.   Resultant  left hand dexterity difficulty  CT head: Acute cortical infarct at high RIGHT parietal lobe.  MRI head:  Acute infarct along the right lateral motor strip    MRA head Negative intracranial MRA.  CTA neck unremarkable  2D Echo: EF 55-60%  TEE positive PFO but otherwise normal  Recommend loop recorder to rule out afib.   LE venous Doppler: no DVT  LDL: 45  HgbA1c: 5.4  SCDs for VTE prophylaxis Diet Carb Modified Fluid consistency: Thin; Room service appropriate? Yes  No antithrombotic prior to admission, now on aspirin 325 mg daily. Continue aspirin at discharge.  Patient counseled to be compliant with her antithrombotic medications  Ongoing aggressive stroke  risk factor management  Therapy recommendations: Home health OT;Supervision - Intermittent   Disposition:  Pending  History of rheumatic fever in childhood  No significant cardiac issue since then  TTE unremarkable  TEE unremarkable vulves but positive PFO  Hypertension  Stable  Permissive hypertension (OK if < 220/120) but gradually normalize in 5-7 days  Long-term BP goal normotensive  Diabetes  HgbA1c 5.4, goal < 7.0  Controlled  CBG monitoring  Hyperlipidemia   Home medication - Crestor and fenofibrate  LDL 45, goal < 70  Continue Crestor  Continue fenofibrate  Other Stroke Risk Factors  Morbid obesity, Body mass index is 43.42 kg/m., recommend weight loss, diet and exercise as appropriate   Family hx stroke (father)  Other Active Problems  S/p bilateral knee surgery  Chronic left foot drop after left knee  surgery  Hospital day # 1  Rosalin Hawking, MD PhD Stroke Neurology 03/16/2017 11:19 AM    To contact Stroke Continuity provider, please refer to [email or web address] After hours, contact General Neurology

## 2017-03-16 NOTE — Progress Notes (Addendum)
[facility] PROGRESS NOTE    Progress Note  Rachel Black  DUK:[id number] DOB: [birth year] DOA: 03/14/2017 PCP: Ann Held, DO     Brief Narrative:   Rachel Black is an 66 y.o. female past medical history significant for essential hypertension, diabetes mellitus type 2 and hyperlipidemia who noticed the day before admission she started having left arm weakness. She called her husband who noticed her face history of seizures brought here into the ED. CT scan of the head showed a nonhemorrhagic cortical infarct in the right parietal lobe.  Assessment/Plan:   Acute CVA (cerebrovascular accident) [facility]): Hgb A1c 5.4, fasting lipid panel  HDL < 40, LDL 45, on Lipitor. MRI, MRA of the brain without contrast: Acute infarct on the right lateral motor strip. Negative intracranial MRA. Neurology recommended a loop recorder, if TEE negative. T scheduled for today at 3 PM. Lower extremity Doppler negative Echocardiogram EF 55% CTA unremarkable. Prophylactic therapy-Antiplatelet med: Aspirin - dose 325 mg PO daily, continue aspirin at discharge. No events on Cardiac Monitoring  Neurochecks q4h  PT eval pending  DM2 (diabetes mellitus, type 2) ([place]): Last hemoglobin A1c was 5.4. Blood glucose 126 fasting.  Hyperlipidemia LDL goal <70 Continue Lipitor.  Severe obesity (BMI >= 40) (HCC)  Essential hypertension Allow permissive hypertension. Hold antihypertensive medication.  AKI (acute kidney injury) ([place]) With baseline creatinine around 1. KVO IV fluids creatinine is improving. Continue to hold ACE inhibitor.  Hypokalemia: Repleted orally.  DVT prophylaxis: lovenox Family Communication:husband Disposition Plan/Barrier to D/C: home in am Code Status:     Code Status Orders        Start     Ordered   03/14/17 1219  Full code  Continuous     03/14/17 1218    Code Status History    Date Active Date Inactive Code Status Order ID Comments User Context   12/07/2016  5:08 AM 12/10/2016  9:35 PM Full Code [id number]  Etta Quill, DO ED   08/23/2015  6:04 PM 08/26/2015  6:45 PM Full Code [id number]  Mcarthur Rossetti, MD Inpatient   04/20/2014  8:36 PM 04/25/2014  9:37 PM Full Code [id number]  Mcarthur Rossetti, MD Inpatient        IV Access:    Peripheral IV   Procedures and diagnostic studies:   Ct Angio Neck W Or Wo Contrast  Result Date: 03/15/2017 CLINICAL DATA:  Stroke with left arm numbness. EXAM: CT ANGIOGRAPHY NECK TECHNIQUE: Multidetector CT imaging of the neck was performed using the standard protocol during bolus administration of intravenous contrast. Multiplanar CT image reconstructions and MIPs were obtained to evaluate the vascular anatomy. Carotid stenosis measurements (when applicable) are obtained utilizing NASCET criteria, using the distal internal carotid diameter as the denominator. CONTRAST:  50 cc Isovue 370 intravenous COMPARISON:  None. FINDINGS: Aortic arch: Normal appearance of the arch.  Three vessel branching. Right carotid system: Smooth and widely patent. Common carotid tortuosity with partial retropharyngeal course. Negative for beading. Left carotid system: Smooth and widely patent. Common carotid tortuosity with partial retropharyngeal course. Negative for beading. Vertebral arteries: No proximal subclavian stenosis. Both vertebral arteries are smooth and widely patent to the dura. Skeleton: No acute or aggressive finding. Facet arthropathy with slight anterolisthesis at C3-4. Other neck: No incidental mass or inflammation. Upper chest: No acute finding. IMPRESSION: Negative CTA of the neck. No stenosis, atherosclerosis, or embolic source. Electronically Signed   By: Monte Fantasia M.D.   On: 03/15/2017 11:34  Mr Brain 53 Contrast  Result Date: 03/14/2017 CLINICAL DATA:  66 year old female with acute onset left arm weakness, slurred speech, left facial droop. EXAM: MRI HEAD WITHOUT CONTRAST MRA HEAD  WITHOUT CONTRAST TECHNIQUE: Multiplanar, multiecho pulse sequences of the brain and surrounding structures were obtained without intravenous contrast. Angiographic images of the head were obtained using MRA technique without contrast. COMPARISON:  Head CT 0634 hours today. FINDINGS: MRI HEAD FINDINGS Brain: Restricted diffusion along the lateral right motor strip tracking to the right frontal operculum (series 9, image 17). Associated gyral edema. No associated hemorrhage or mass effect. No other restricted diffusion. No midline shift, mass effect, evidence of mass lesion, ventriculomegaly, extra-axial collection or acute intracranial hemorrhage. Cervicomedullary junction and pituitary are within normal limits. There is mild for age scattered nonspecific cerebral white matter T2 and FLAIR hyperintensity. There are several subtle chronic lacunar infarcts in the cerebellum (series 8, image 8). And there is mild nonspecific T2 heterogeneity in both globus pallidus. No cortical encephalomalacia or chronic cerebral blood products identified. Vascular: Major intracranial vascular flow voids are preserved. Skull and upper cervical spine: Negative. Visualized bone marrow signal is within normal limits. Sinuses/Orbits: Normal orbit soft tissues. Visualized paranasal sinuses and mastoids are stable and well pneumatized. Other: Visible internal auditory structures appear normal. Negative scalp soft tissues. MRA HEAD FINDINGS Antegrade flow in the posterior circulation with codominant distal vertebral arteries. No distal vertebral stenosis. Normal basilar artery. AICA, SCA, and PCA origins are normal (the left PCA origin is fetal type). The right posterior communicating artery is diminutive. Bilateral PCA branches are within normal limits. Antegrade flow in both ICA siphons. No siphon stenosis. Mildly tortuous cavernous ICAs. Ophthalmic and posterior communicating artery origins are normal. Patent carotid termini. Normal MCA  and ACA origins. Anterior communicating artery and visible ACA branches are within normal limits. Left MCA branches are within normal limits. Right MCA M1 segment, right MCA bifurcation, and visible right MCA branches are within normal limits. No right MCA branch occlusion identified. IMPRESSION: 1. Acute infarct along the right lateral motor strip corresponding to the noncontrast CT finding earlier today. Gyral cytotoxic edema but no associated hemorrhage or mass effect. 2.  Negative intracranial MRA. 3. Evidence of mild for age underlying small vessel disease. Electronically Signed   By: Genevie Ann M.D.   On: 03/14/2017 21:13   Mr Jodene Nam Head/brain KG Cm  Result Date: 03/14/2017 CLINICAL DATA:  66 year old female with acute onset left arm weakness, slurred speech, left facial droop. EXAM: MRI HEAD WITHOUT CONTRAST MRA HEAD WITHOUT CONTRAST TECHNIQUE: Multiplanar, multiecho pulse sequences of the brain and surrounding structures were obtained without intravenous contrast. Angiographic images of the head were obtained using MRA technique without contrast. COMPARISON:  Head CT 0634 hours today. FINDINGS: MRI HEAD FINDINGS Brain: Restricted diffusion along the lateral right motor strip tracking to the right frontal operculum (series 9, image 17). Associated gyral edema. No associated hemorrhage or mass effect. No other restricted diffusion. No midline shift, mass effect, evidence of mass lesion, ventriculomegaly, extra-axial collection or acute intracranial hemorrhage. Cervicomedullary junction and pituitary are within normal limits. There is mild for age scattered nonspecific cerebral white matter T2 and FLAIR hyperintensity. There are several subtle chronic lacunar infarcts in the cerebellum (series 8, image 8). And there is mild nonspecific T2 heterogeneity in both globus pallidus. No cortical encephalomalacia or chronic cerebral blood products identified. Vascular: Major intracranial vascular flow voids are  preserved. Skull and upper cervical spine: Negative. Visualized bone marrow signal is  within normal limits. Sinuses/Orbits: Normal orbit soft tissues. Visualized paranasal sinuses and mastoids are stable and well pneumatized. Other: Visible internal auditory structures appear normal. Negative scalp soft tissues. MRA HEAD FINDINGS Antegrade flow in the posterior circulation with codominant distal vertebral arteries. No distal vertebral stenosis. Normal basilar artery. AICA, SCA, and PCA origins are normal (the left PCA origin is fetal type). The right posterior communicating artery is diminutive. Bilateral PCA branches are within normal limits. Antegrade flow in both ICA siphons. No siphon stenosis. Mildly tortuous cavernous ICAs. Ophthalmic and posterior communicating artery origins are normal. Patent carotid termini. Normal MCA and ACA origins. Anterior communicating artery and visible ACA branches are within normal limits. Left MCA branches are within normal limits. Right MCA M1 segment, right MCA bifurcation, and visible right MCA branches are within normal limits. No right MCA branch occlusion identified. IMPRESSION: 1. Acute infarct along the right lateral motor strip corresponding to the noncontrast CT finding earlier today. Gyral cytotoxic edema but no associated hemorrhage or mass effect. 2.  Negative intracranial MRA. 3. Evidence of mild for age underlying small vessel disease. Electronically Signed   By: Genevie Ann M.D.   On: 03/14/2017 21:13     Medical Consultants:    None.  Anti-Infectives:   None  Subjective:    Rachel Black no new complains.  Objective:    Vitals:   03/15/17 2110 03/16/17 0112 03/16/17 0458 03/16/17 0800  BP: (!) 125/98 122/88 (!) 124/91 114/61  Pulse: 66 69 73 74  Resp: 18 20 20 20   Temp: 98.6 F (37 C) 98.1 F (36.7 C) 98.5 F (36.9 C) 97.7 F (36.5 C)  TempSrc: Oral Oral Oral   SpO2: 98% 98% 98%   Weight:      Height:        Intake/Output  Summary (Last 24 hours) at 03/16/17 0955 Last data filed at 03/16/17 0500  Gross per 24 hour  Intake          2766.25 ml  Output                0 ml  Net          2766.25 ml   Filed Weights   03/14/17 0546  Weight: 122 kg (269 lb)    Exam: General exam: In no acute distress Respiratory system: Good air movement and clear to auscultation. Cardiovascular system: Irregular rate and rhythm with positive S1-S2 no JVD Gastrointestinal system: Other bowel sounds soft nontender nondistended. Central nervous system: Awake alert and oriented 3, 3-12 are grossly intact sensation is intact throughout muscle strength is 4 out of 5 on deltoid and bicep on the left upper extremity. Deep tendon reflexes are 2+ bilateral and symmetrical. No ataxia. Extremities: No lower extremity edema. Skin: No rashes. Psychiatry: Judgement and insight appear normal. Mood & affect appropriate.    Data Reviewed:    Labs: Basic Metabolic Panel:  Recent Labs Lab 03/14/17 0550 03/14/17 0644 03/16/17 0357  NA 141 139 138  K 4.1 4.0 3.3*  CL 107 107 107  CO2 23  --  23  GLUCOSE 131* 126* 110*  BUN 29* 28* 18  CREATININE 1.35* 1.40* 1.20*  CALCIUM 10.2  --  9.4   GFR Estimated Creatinine Clearance: 63.1 mL/min (A) (by C-G formula based on SCr of 1.2 mg/dL (H)). Liver Function Tests:  Recent Labs Lab 03/14/17 0550  AST 31  ALT 20  ALKPHOS 32*  BILITOT 0.4  PROT 6.4*  ALBUMIN 3.9  No results for input(s): LIPASE, AMYLASE in the last 168 hours. No results for input(s): AMMONIA in the last 168 hours. Coagulation profile  Recent Labs Lab 03/14/17 0550  INR 0.96    CBC:  Recent Labs Lab 03/14/17 0550 03/14/17 0644 03/16/17 0357  WBC 5.3  --  4.8  NEUTROABS 3.5  --   --   HGB 11.2* 11.6* 10.3*  HCT 34.7* 34.0* 32.5*  MCV 88.3  --  89.0  PLT 99*  --  94*   Cardiac Enzymes: No results for input(s): CKTOTAL, CKMB, CKMBINDEX, TROPONINI in the last 168 hours. BNP (last 3 results) No  results for input(s): PROBNP in the last 8760 hours. CBG:  Recent Labs Lab 03/15/17 0744 03/15/17 1106 03/15/17 1646 03/15/17 2109 03/16/17 0604  GLUCAP 115* 161* 111* 109* 110*   D-Dimer: No results for input(s): DDIMER in the last 72 hours. Hgb A1c:  Recent Labs  03/15/17 0402  HGBA1C 5.4   Lipid Profile:  Recent Labs  03/15/17 0402  CHOL 125  HDL 23*  LDLCALC 45  TRIG 284*  CHOLHDL 5.4   Thyroid function studies: No results for input(s): TSH, T4TOTAL, T3FREE, THYROIDAB in the last 72 hours.  Invalid input(s): FREET3 Anemia work up: No results for input(s): VITAMINB12, FOLATE, FERRITIN, TIBC, IRON, RETICCTPCT in the last 72 hours. Sepsis Labs:  Recent Labs Lab 03/14/17 0550 03/16/17 0357  WBC 5.3 4.8   Microbiology No results found for this or any previous visit (from the past 240 hour(s)).   Medications:   . allopurinol  300 mg Oral BID  . aspirin  300 mg Rectal Daily   Or  . aspirin  325 mg Oral Daily  . famotidine  20 mg Oral QHS  . fenofibrate  160 mg Oral Daily  . ferrous sulfate  325 mg Oral BID  . pantoprazole  40 mg Oral Daily  . rosuvastatin  20 mg Oral Daily   Continuous Infusions: . sodium chloride 75 mL/hr at 03/14/17 1607    Time spent: 25 min   LOS: 1 day   Charlynne Cousins  [facility] Pager [phone]  *Please refer to [email or web address], password [email or web address] to get updated schedule on who will round on this patient, as hospitalists switch teams weekly. If 7PM-7AM, please contact night-coverage at [email or web address], password [email or web address] for any overnight needs.  03/16/2017, 9:55 AM

## 2017-03-16 NOTE — CV Procedure (Signed)
    Transesophageal Echocardiogram Note  Rachel Black [id number] 08/01/1951  Procedure: Transesophageal Echocardiogram Indications: CVA   Procedure Details Consent: Obtained Time Out: Verified patient identification, verified procedure, site/side was marked, verified correct patient position, special equipment/implants available, Radiology Safety Procedures followed,  medications/allergies/relevent history reviewed, required imaging and test results available.  Performed  Medications:  During this procedure the patient is administered a total of Versed 4  mg and Fentanyl  50  mcg  to achieve and maintain moderate conscious sedation.  The patient's heart rate, blood pressure, and oxygen saturation are monitored continuously during the procedure. The period of conscious sedation is 30  minutes, of which I was present face-to-face 100% of this time.  Left Ventrical:  Normal LV   Mitral Valve: normal   Aortic Valve: normal  Tricuspid Valve: normal   Pulmonic Valve: poorly visualized   Left Atrium/ Left atrial appendage: small,  No thrombus   Atrial septum: + PFO by bubble study   Aorta: mild atherosclerosis    Complications: No apparent complications Patient did tolerate procedure well.   Thayer Headings, Brooke Bonito., MD, [facility] 03/16/2017, 3:16 PM

## 2017-03-16 NOTE — Interval H&P Note (Signed)
History and Physical Interval Note:  03/16/2017 2:23 PM  Rachel Black  has presented today for surgery, with the diagnosis of STROKE  The various methods of treatment have been discussed with the patient and family. After consideration of risks, benefits and other options for treatment, the patient has consented to  Procedure(s): TRANSESOPHAGEAL ECHOCARDIOGRAM (TEE) (N/A) as a surgical intervention .  The patient's history has been reviewed, patient examined, no change in status, stable for surgery.  I have reviewed the patient's chart and labs.  Questions were answered to the patient's satisfaction.     [name]

## 2017-03-16 NOTE — Progress Notes (Signed)
PT Cancellation Note  Patient Details Name: Rachel Black MRN: [id number] DOB: [birth year]   Cancelled Treatment:    Reason Eval/Treat Not Completed: Patient at procedure or test/unavailable   Duncan Dull 03/16/2017, 2:06 PM

## 2017-03-16 NOTE — H&P (View-Only) (Signed)
[facility] PROGRESS NOTE    Progress Note  Rachel Black  IEP:[id number] DOB: 1951/01/02 DOA: 03/14/2017 PCP: Ann Held, DO     Brief Narrative:   Rachel Black is an 66 y.o. female past medical history significant for essential hypertension, diabetes mellitus type 2 and hyperlipidemia who noticed the day before admission she started having left arm weakness. She called her husband who noticed her face history of seizures brought here into the ED. CT scan of the head showed a nonhemorrhagic cortical infarct in the right parietal lobe.  Assessment/Plan:   Acute CVA (cerebrovascular accident) [facility]): Hgb A1c 5.4, fasting lipid panel  HDL < 40, LDL 45, on Lipitor. MRI, MRA of the brain without contrast: Acute infarct on the right lateral motor strip. Negative intracranial MRA. Neurology recommended a loop recorder, if TEE negative. T scheduled for today at 3 PM. Lower extremity Doppler negative Echocardiogram EF 55% CTA unremarkable. Prophylactic therapy-Antiplatelet med: Aspirin - dose 325 mg PO daily, continue aspirin at discharge. No events on Cardiac Monitoring  Neurochecks q4h  PT eval pending  DM2 (diabetes mellitus, type 2) ([place]): Last hemoglobin A1c was 5.4. Blood glucose 126 fasting.  Hyperlipidemia LDL goal <70 Continue Lipitor.  Severe obesity (BMI >= 40) (HCC)  Essential hypertension Allow permissive hypertension. Hold antihypertensive medication.  AKI (acute kidney injury) ([place]) With baseline creatinine around 1. KVO IV fluids creatinine is improving. Continue to hold ACE inhibitor.  Hypokalemia: Repleted orally.  DVT prophylaxis: lovenox Family Communication:husband Disposition Plan/Barrier to D/C: home in am Code Status:     Code Status Orders        Start     Ordered   03/14/17 1219  Full code  Continuous     03/14/17 1218    Code Status History    Date Active Date Inactive Code Status Order ID Comments User Context   12/07/2016  5:08 AM 12/10/2016  9:35 PM Full Code [id number]  Etta Quill, DO ED   08/23/2015  6:04 PM 08/26/2015  6:45 PM Full Code [id number]  Mcarthur Rossetti, MD Inpatient   04/20/2014  8:36 PM 04/25/2014  9:37 PM Full Code [id number]  Mcarthur Rossetti, MD Inpatient        IV Access:    Peripheral IV   Procedures and diagnostic studies:   Ct Angio Neck W Or Wo Contrast  Result Date: 03/15/2017 CLINICAL DATA:  Stroke with left arm numbness. EXAM: CT ANGIOGRAPHY NECK TECHNIQUE: Multidetector CT imaging of the neck was performed using the standard protocol during bolus administration of intravenous contrast. Multiplanar CT image reconstructions and MIPs were obtained to evaluate the vascular anatomy. Carotid stenosis measurements (when applicable) are obtained utilizing NASCET criteria, using the distal internal carotid diameter as the denominator. CONTRAST:  50 cc Isovue 370 intravenous COMPARISON:  None. FINDINGS: Aortic arch: Normal appearance of the arch.  Three vessel branching. Right carotid system: Smooth and widely patent. Common carotid tortuosity with partial retropharyngeal course. Negative for beading. Left carotid system: Smooth and widely patent. Common carotid tortuosity with partial retropharyngeal course. Negative for beading. Vertebral arteries: No proximal subclavian stenosis. Both vertebral arteries are smooth and widely patent to the dura. Skeleton: No acute or aggressive finding. Facet arthropathy with slight anterolisthesis at C3-4. Other neck: No incidental mass or inflammation. Upper chest: No acute finding. IMPRESSION: Negative CTA of the neck. No stenosis, atherosclerosis, or embolic source. Electronically Signed   By: Monte Fantasia M.D.   On: 03/15/2017 11:34  Mr Brain 58 Contrast  Result Date: 03/14/2017 CLINICAL DATA:  66 year old female with acute onset left arm weakness, slurred speech, left facial droop. EXAM: MRI HEAD WITHOUT CONTRAST MRA HEAD  WITHOUT CONTRAST TECHNIQUE: Multiplanar, multiecho pulse sequences of the brain and surrounding structures were obtained without intravenous contrast. Angiographic images of the head were obtained using MRA technique without contrast. COMPARISON:  Head CT 0634 hours today. FINDINGS: MRI HEAD FINDINGS Brain: Restricted diffusion along the lateral right motor strip tracking to the right frontal operculum (series 9, image 17). Associated gyral edema. No associated hemorrhage or mass effect. No other restricted diffusion. No midline shift, mass effect, evidence of mass lesion, ventriculomegaly, extra-axial collection or acute intracranial hemorrhage. Cervicomedullary junction and pituitary are within normal limits. There is mild for age scattered nonspecific cerebral white matter T2 and FLAIR hyperintensity. There are several subtle chronic lacunar infarcts in the cerebellum (series 8, image 8). And there is mild nonspecific T2 heterogeneity in both globus pallidus. No cortical encephalomalacia or chronic cerebral blood products identified. Vascular: Major intracranial vascular flow voids are preserved. Skull and upper cervical spine: Negative. Visualized bone marrow signal is within normal limits. Sinuses/Orbits: Normal orbit soft tissues. Visualized paranasal sinuses and mastoids are stable and well pneumatized. Other: Visible internal auditory structures appear normal. Negative scalp soft tissues. MRA HEAD FINDINGS Antegrade flow in the posterior circulation with codominant distal vertebral arteries. No distal vertebral stenosis. Normal basilar artery. AICA, SCA, and PCA origins are normal (the left PCA origin is fetal type). The right posterior communicating artery is diminutive. Bilateral PCA branches are within normal limits. Antegrade flow in both ICA siphons. No siphon stenosis. Mildly tortuous cavernous ICAs. Ophthalmic and posterior communicating artery origins are normal. Patent carotid termini. Normal MCA  and ACA origins. Anterior communicating artery and visible ACA branches are within normal limits. Left MCA branches are within normal limits. Right MCA M1 segment, right MCA bifurcation, and visible right MCA branches are within normal limits. No right MCA branch occlusion identified. IMPRESSION: 1. Acute infarct along the right lateral motor strip corresponding to the noncontrast CT finding earlier today. Gyral cytotoxic edema but no associated hemorrhage or mass effect. 2.  Negative intracranial MRA. 3. Evidence of mild for age underlying small vessel disease. Electronically Signed   By: Genevie Ann M.D.   On: 03/14/2017 21:13   Mr Jodene Nam Head/brain PI Cm  Result Date: 03/14/2017 CLINICAL DATA:  66 year old female with acute onset left arm weakness, slurred speech, left facial droop. EXAM: MRI HEAD WITHOUT CONTRAST MRA HEAD WITHOUT CONTRAST TECHNIQUE: Multiplanar, multiecho pulse sequences of the brain and surrounding structures were obtained without intravenous contrast. Angiographic images of the head were obtained using MRA technique without contrast. COMPARISON:  Head CT 0634 hours today. FINDINGS: MRI HEAD FINDINGS Brain: Restricted diffusion along the lateral right motor strip tracking to the right frontal operculum (series 9, image 17). Associated gyral edema. No associated hemorrhage or mass effect. No other restricted diffusion. No midline shift, mass effect, evidence of mass lesion, ventriculomegaly, extra-axial collection or acute intracranial hemorrhage. Cervicomedullary junction and pituitary are within normal limits. There is mild for age scattered nonspecific cerebral white matter T2 and FLAIR hyperintensity. There are several subtle chronic lacunar infarcts in the cerebellum (series 8, image 8). And there is mild nonspecific T2 heterogeneity in both globus pallidus. No cortical encephalomalacia or chronic cerebral blood products identified. Vascular: Major intracranial vascular flow voids are  preserved. Skull and upper cervical spine: Negative. Visualized bone marrow signal is  within normal limits. Sinuses/Orbits: Normal orbit soft tissues. Visualized paranasal sinuses and mastoids are stable and well pneumatized. Other: Visible internal auditory structures appear normal. Negative scalp soft tissues. MRA HEAD FINDINGS Antegrade flow in the posterior circulation with codominant distal vertebral arteries. No distal vertebral stenosis. Normal basilar artery. AICA, SCA, and PCA origins are normal (the left PCA origin is fetal type). The right posterior communicating artery is diminutive. Bilateral PCA branches are within normal limits. Antegrade flow in both ICA siphons. No siphon stenosis. Mildly tortuous cavernous ICAs. Ophthalmic and posterior communicating artery origins are normal. Patent carotid termini. Normal MCA and ACA origins. Anterior communicating artery and visible ACA branches are within normal limits. Left MCA branches are within normal limits. Right MCA M1 segment, right MCA bifurcation, and visible right MCA branches are within normal limits. No right MCA branch occlusion identified. IMPRESSION: 1. Acute infarct along the right lateral motor strip corresponding to the noncontrast CT finding earlier today. Gyral cytotoxic edema but no associated hemorrhage or mass effect. 2.  Negative intracranial MRA. 3. Evidence of mild for age underlying small vessel disease. Electronically Signed   By: Genevie Ann M.D.   On: 03/14/2017 21:13     Medical Consultants:    None.  Anti-Infectives:   None  Subjective:    Rachel Black no new complains.  Objective:    Vitals:   03/15/17 2110 03/16/17 0112 03/16/17 0458 03/16/17 0800  BP: (!) 125/98 122/88 (!) 124/91 114/61  Pulse: 66 69 73 74  Resp: 18 20 20 20   Temp: 98.6 F (37 C) 98.1 F (36.7 C) 98.5 F (36.9 C) 97.7 F (36.5 C)  TempSrc: Oral Oral Oral   SpO2: 98% 98% 98%   Weight:      Height:        Intake/Output  Summary (Last 24 hours) at 03/16/17 0955 Last data filed at 03/16/17 0500  Gross per 24 hour  Intake          2766.25 ml  Output                0 ml  Net          2766.25 ml   Filed Weights   03/14/17 0546  Weight: 122 kg (269 lb)    Exam: General exam: In no acute distress Respiratory system: Good air movement and clear to auscultation. Cardiovascular system: Irregular rate and rhythm with positive S1-S2 no JVD Gastrointestinal system: Other bowel sounds soft nontender nondistended. Central nervous system: Awake alert and oriented 3, 3-12 are grossly intact sensation is intact throughout muscle strength is 4 out of 5 on deltoid and bicep on the left upper extremity. Deep tendon reflexes are 2+ bilateral and symmetrical. No ataxia. Extremities: No lower extremity edema. Skin: No rashes. Psychiatry: Judgement and insight appear normal. Mood & affect appropriate.    Data Reviewed:    Labs: Basic Metabolic Panel:  Recent Labs Lab 03/14/17 0550 03/14/17 0644 03/16/17 0357  NA 141 139 138  K 4.1 4.0 3.3*  CL 107 107 107  CO2 23  --  23  GLUCOSE 131* 126* 110*  BUN 29* 28* 18  CREATININE 1.35* 1.40* 1.20*  CALCIUM 10.2  --  9.4   GFR Estimated Creatinine Clearance: 63.1 mL/min (A) (by C-G formula based on SCr of 1.2 mg/dL (H)). Liver Function Tests:  Recent Labs Lab 03/14/17 0550  AST 31  ALT 20  ALKPHOS 32*  BILITOT 0.4  PROT 6.4*  ALBUMIN 3.9  No results for input(s): LIPASE, AMYLASE in the last 168 hours. No results for input(s): AMMONIA in the last 168 hours. Coagulation profile  Recent Labs Lab 03/14/17 0550  INR 0.96    CBC:  Recent Labs Lab 03/14/17 0550 03/14/17 0644 03/16/17 0357  WBC 5.3  --  4.8  NEUTROABS 3.5  --   --   HGB 11.2* 11.6* 10.3*  HCT 34.7* 34.0* 32.5*  MCV 88.3  --  89.0  PLT 99*  --  94*   Cardiac Enzymes: No results for input(s): CKTOTAL, CKMB, CKMBINDEX, TROPONINI in the last 168 hours. BNP (last 3 results) No  results for input(s): PROBNP in the last 8760 hours. CBG:  Recent Labs Lab 03/15/17 0744 03/15/17 1106 03/15/17 1646 03/15/17 2109 03/16/17 0604  GLUCAP 115* 161* 111* 109* 110*   D-Dimer: No results for input(s): DDIMER in the last 72 hours. Hgb A1c:  Recent Labs  03/15/17 0402  HGBA1C 5.4   Lipid Profile:  Recent Labs  03/15/17 0402  CHOL 125  HDL 23*  LDLCALC 45  TRIG 284*  CHOLHDL 5.4   Thyroid function studies: No results for input(s): TSH, T4TOTAL, T3FREE, THYROIDAB in the last 72 hours.  Invalid input(s): FREET3 Anemia work up: No results for input(s): VITAMINB12, FOLATE, FERRITIN, TIBC, IRON, RETICCTPCT in the last 72 hours. Sepsis Labs:  Recent Labs Lab 03/14/17 0550 03/16/17 0357  WBC 5.3 4.8   Microbiology No results found for this or any previous visit (from the past 240 hour(s)).   Medications:   . allopurinol  300 mg Oral BID  . aspirin  300 mg Rectal Daily   Or  . aspirin  325 mg Oral Daily  . famotidine  20 mg Oral QHS  . fenofibrate  160 mg Oral Daily  . ferrous sulfate  325 mg Oral BID  . pantoprazole  40 mg Oral Daily  . rosuvastatin  20 mg Oral Daily   Continuous Infusions: . sodium chloride 75 mL/hr at 03/14/17 1607    Time spent: 25 min   LOS: 1 day   Charlynne Cousins  [facility] Pager [phone]  *Please refer to [email or web address], password [email or web address] to get updated schedule on who will round on this patient, as hospitalists switch teams weekly. If 7PM-7AM, please contact night-coverage at [email or web address], password [email or web address] for any overnight needs.  03/16/2017, 9:55 AM

## 2017-03-16 NOTE — Progress Notes (Signed)
Occupational Therapy Treatment Patient Details Name: Rachel Black MRN: [id number] DOB: Dec 21, 1950 Today's Date: 03/16/2017    History of present illness 66 y.o. female past medical history significant for essential hypertension, diabetes mellitus type 2 and hyperlipidemia who noticed the day before admission she started having left arm weakness. She called her husband who noticed her face history of seizures brought here into the ED. CT scan of the head showed a nonhemorrhagic cortical infarct in the right parietal lobe. Acute infarct on the right lateral motor strip per MRI.   OT comments  Pt educated on HEP for LUE sensorimotor tasks. See below. Pt is safe to DC home with S of husband when medically stable. Will continue to follow acutely to address established goals.   Follow Up Recommendations  Home health OT;Supervision - Intermittent    Equipment Recommendations  None recommended by OT    Recommendations for Other Services      Precautions / Restrictions Precautions Precautions: Fall                                                     ADL either performed or assessed with clinical judgement   ADL  Educated on strategies to reduce risk of falls.                                                                Cognition Arousal/Alertness: Awake/alert Behavior During Therapy: WFL for tasks assessed/performed Overall Cognitive Status: History of cognitive impairments - at baseline                                          Exercises Other Exercises Other Exercises: theraputty HEP - yellow Other Exercises: fine motor/coordination HEP Other Exercises: BUE integrated activities Other Exercises: compensatory strategies for sensory deficits   Shoulder Instructions       General Comments      Pertinent Vitals/ Pain       Pain Assessment: No/denies pain  Home Living                                           Prior Functioning/Environment              Frequency  Min 3X/week        Progress Toward Goals  OT Goals(current goals can now be found in the care plan section)  Progress towards OT goals: Progressing toward goals  Acute Rehab OT Goals Patient Stated Goal: to go home/use my left arm better OT Goal Formulation: With patient Time For Goal Achievement: 03/29/17 Potential to Achieve Goals: Good ADL Goals Pt/caregiver will Perform Home Exercise Program: Left upper extremity;With theraputty;With Supervision;With written HEP provided Additional ADL Goal #1: Pt will independently  verbalzie 3 strategies to reduce risk of falls.  Plan Discharge plan remains appropriate    Co-evaluation  AM-PAC PT "6 Clicks" Daily Activity     Outcome Measure   Help from another person eating meals?: None Help from another person taking care of personal grooming?: None Help from another person toileting, which includes using toliet, bedpan, or urinal?: A Little Help from another person bathing (including washing, rinsing, drying)?: A Little Help from another person to put on and taking off regular upper body clothing?: None Help from another person to put on and taking off regular lower body clothing?: A Little 6 Click Score: 21    End of Session    OT Visit Diagnosis: History of falling (Z91.81);Muscle weakness (generalized) (M62.81)   Activity Tolerance Patient tolerated treatment well   Patient Left in bed;with call bell/phone within reach;with family/visitor present   Nurse Communication Mobility status;Other (comment) (DC needs)        Time: 1102-1117 OT Time Calculation (min): 23 min  Charges: OT General Charges $OT Visit: 1 Procedure OT Treatments $Therapeutic Activity: 23-37 mins  [facility], OT/L  [phone] 03/16/2017   Kayliegh Boyers,HILLARY 03/16/2017, 1:47 PM

## 2017-03-17 ENCOUNTER — Encounter (HOSPITAL_COMMUNITY): Payer: Self-pay | Admitting: Cardiovascular Disease

## 2017-03-17 LAB — GLUCOSE, CAPILLARY
GLUCOSE-CAPILLARY: 114 mg/dL — AB (ref 65–99)
GLUCOSE-CAPILLARY: 145 mg/dL — AB (ref 65–99)
GLUCOSE-CAPILLARY: 125 mg/dL — AB (ref 65–99)
Glucose-Capillary: 119 mg/dL — ABNORMAL HIGH (ref 65–99)

## 2017-03-17 LAB — BASIC METABOLIC PANEL
Anion gap: 7 (ref 5–15)
BUN: 20 mg/dL (ref 6–20)
CALCIUM: 9.4 mg/dL (ref 8.9–10.3)
CO2: 23 mmol/L (ref 22–32)
CREATININE: 1.26 mg/dL — AB (ref 0.44–1.00)
Chloride: 108 mmol/L (ref 101–111)
GFR calc non Af Amer: 44 mL/min — ABNORMAL LOW (ref >60)
GFR, EST AFRICAN AMERICAN: 51 mL/min — AB (ref >60)
Glucose, Bld: 111 mg/dL — ABNORMAL HIGH (ref 65–99)
Potassium: 3.8 mmol/L (ref 3.5–5.1)
SODIUM: 138 mmol/L (ref 135–145)

## 2017-03-17 MED ORDER — ROSUVASTATIN CALCIUM 20 MG PO TABS: 20.0000 mg | ORAL_TABLET | Freq: Every day | ORAL | 3 refills | Status: DC

## 2017-03-17 MED ORDER — ASPIRIN 325 MG PO TABS: 325.0000 mg | ORAL_TABLET | Freq: Every day | ORAL | 3 refills | Status: DC

## 2017-03-17 MED ORDER — SENNOSIDES-DOCUSATE SODIUM 8.6-50 MG PO TABS: 1.0000 | ORAL_TABLET | Freq: Every evening | ORAL | 1 refills | Status: DC | PRN

## 2017-03-17 MED ORDER — VALSARTAN-HYDROCHLOROTHIAZIDE 80-12.5 MG PO TABS: 1.0000 | ORAL_TABLET | Freq: Every day | ORAL | 0 refills | Status: DC

## 2017-03-17 NOTE — Progress Notes (Signed)
Received call from Dr. Allyson Sabal inquiring about status of loop. Per chart review, cardiology was asked to arrange TEE 03/15/17 which was performed yesterday. Subsequent neuro notes 7/3 indicate recommendation for loop but not clear that this was conveyed to cardiology team (coordinator is off for holiday, this is my first day in hospital this week). Since it is the holiday these are not being performed today. Dr. Allyson Sabal states pt is willing to stay so I sent message to [place] (coordinator) and EP team for tomorrow to consult on this patient for ILR. Aquita Simmering PA-C

## 2017-03-17 NOTE — Care Management Note (Signed)
Case Management Note  Patient Details  Name: Rachel Black MRN: [id number] Date of Birth: [birth year]  Subjective/Objective:                    Action/Plan: Pt with orders for [facility] services. CM met with the patient and her spouse. Currently she is refusing [facility] services. MD aware.  Pt has insurance, PCP and transportation home. No further needs per CM.    Expected Discharge Date:  03/17/17               Expected Discharge Plan:  Home/Self Care  In-House Referral:     Discharge planning Services  CM Consult  Post Acute Care Choice:    Choice offered to:     DME Arranged:    DME Agency:     HH Arranged:    HH Agency:     Status of Service:  Completed, signed off  If discussed at [employer] of Stay Meetings, dates discussed:    Additional Comments:  Pollie Friar, RN 03/17/2017, 3:01 PM

## 2017-03-17 NOTE — Progress Notes (Signed)
Received report at bedside. Patient sitting on side of bed, alert and oriented, pleasant and cooperative.  Introduced myself, name on board. Phone and call bell within reach. Will review orders and proceed accordingly.

## 2017-03-17 NOTE — Discharge Summary (Addendum)
Physician Discharge Summary  Rachel Black MRN: [id number] DOB/AGE: Mar 22, 1951 66 y.o.  PCP: Ann Held, DO   Admit date: 03/14/2017 Discharge date: 03/17/2017  Discharge Diagnoses:    Active Problems:   DM2 (diabetes mellitus, type 2) (HCC)   Hyperlipidemia LDL goal <70   Severe obesity (BMI >= 40) (HCC)   Essential hypertension   Morbid obesity (HCC)   AKI (acute kidney injury) ([place])   Acute CVA (cerebrovascular accident) ([place])   PFO (patent foramen ovale)  addendum-discharge canceled due to loop recorder placement on 7/5  Follow-up recommendations Follow-up with PCP in 3-5 days , including all  additional recommended appointments as below Follow-up CBC, CMP in 3-5 days Patient discharged with home health physical therapy    Current Discharge Medication List    START taking these medications   Details  aspirin 325 MG tablet Take 1 tablet (325 mg total) by mouth daily. Qty: 30 tablet, Refills: 3    senna-docusate (SENOKOT-S) 8.6-50 MG tablet Take 1 tablet by mouth at bedtime as needed for mild constipation. Qty: 30 tablet, Refills: 1      CONTINUE these medications which have CHANGED   Details  valsartan-hydrochlorothiazide (DIOVAN-HCT) 80-12.5 MG tablet Take 1 tablet by mouth daily. Qty: 90 tablet, Refills: 0   Associated Diagnoses: Essential hypertension      CONTINUE these medications which have NOT CHANGED   Details  allopurinol (ZYLOPRIM) 300 MG tablet TAKE 1 TABLET BY MOUTH 2 TIMES DAILY Qty: 180 tablet, Refills: 0   Associated Diagnoses: Idiopathic gout, unspecified chronicity, unspecified site    calcium carbonate (TUMS - DOSED IN MG ELEMENTAL CALCIUM) 500 MG chewable tablet Chew 2 tablets by mouth 3 (three) times daily as needed for indigestion or heartburn.    calcium-vitamin D (OSCAL WITH D) 500-200 MG-UNIT per tablet Take 1 tablet by mouth daily with breakfast.     DEXILANT 60 MG capsule TAKE 1 CAPSULE (60 MG TOTAL) BY MOUTH EVERY  MORNING Qty: 90 capsule, Refills: 3    fenofibrate 160 MG tablet TAKE 1 TABLET (160 MG TOTAL) BY MOUTH DAILY. Qty: 90 tablet, Refills: 1    ferrous sulfate 325 (65 FE) MG EC tablet Take 1 tablet (325 mg total) by mouth 2 (two) times daily. Qty: 60 tablet, Refills: 11   Associated Diagnoses: Gastric polyp    JANUMET XR [phone] MG TB24 TAKE 1 TABLET BY MOUTH EVERY DAY Qty: 90 tablet, Refills: 1   Associated Diagnoses: DM (diabetes mellitus) type II uncontrolled, periph vascular disorder (HCC)    potassium chloride SA (K-DUR,KLOR-CON) 20 MEQ tablet Take 1 tablet (20 mEq total) by mouth daily. Qty: 90 tablet, Refills: 1    ranitidine (ZANTAC) 150 MG tablet TAKE 1 TABLET (150 MG TOTAL) BY MOUTH AT BEDTIME. Qty: 30 tablet, Refills: 3    rosuvastatin (CRESTOR) 20 MG tablet TAKE 1 TABLET (20 MG TOTAL) BY MOUTH DAILY. Qty: 90 tablet, Refills: 1   Associated Diagnoses: Hyperlipidemia    ONE TOUCH ULTRA TEST test strip ONETOUCH ULTRA BLUE TEST STRIPS CHECK BLOOD SUGAR DAILY. DX: E11.9 Qty: 100 each, Refills: 12   Associated Diagnoses: Controlled type 2 diabetes mellitus without complication, without long-term current use of insulin (HCC)    ONETOUCH DELICA LANCETS FINE MISC Check glucose qd Qty: 100 each, Refills: 12   Associated Diagnoses: Controlled type 2 diabetes mellitus without complication, without long-term current use of insulin [facility])          Discharge Condition:Stable    Discharge  Instructions Get Medicines reviewed and adjusted: Please take all your medications with you for your next visit with your Primary MD  Please request your Primary MD to go over all hospital tests and procedure/radiological results at the follow up, please ask your Primary MD to get all Hospital records sent to his/her office.  If you experience worsening of your admission symptoms, develop shortness of breath, life threatening emergency, suicidal or homicidal thoughts you must seek medical  attention immediately by calling 911 or calling your MD immediately if symptoms less severe.  You must read complete instructions/literature along with all the possible adverse reactions/side effects for all the Medicines you take and that have been prescribed to you. Take any new Medicines after you have completely understood and accpet all the possible adverse reactions/side effects.   Do not drive when taking Pain medications.   Do not take more than prescribed Pain, Sleep and Anxiety Medications  Special Instructions: If you have smoked or chewed Tobacco in the last 2 yrs please stop smoking, stop any regular Alcohol and or any Recreational drug use.  Wear Seat belts while driving.  Please note  You were cared for by a hospitalist during your hospital stay. Once you are discharged, your primary care physician will handle any further medical issues. Please note that NO REFILLS for any discharge medications will be authorized once you are discharged, as it is imperative that you return to your primary care physician (or establish a relationship with a primary care physician if you do not have one) for your aftercare needs so that they can reassess your need for medications and monitor your lab values.     Allergies  Allergen Reactions  . Other     SOME BANDAIDS CAUSE SKIN IRRITATION  . Penicillins Itching    Has patient had a PCN reaction causing immediate rash, facial/tongue/throat swelling, SOB or lightheadedness with hypotension: No Has patient had a PCN reaction causing severe rash involving mucus membranes or skin necrosis: No Has patient had a PCN reaction that required hospitalization No Has patient had a PCN reaction occurring within the last 10 years: No If all of the above answers are "NO", then may proceed with Cephalosporin use.       Disposition: 01-Home or Self Care   Consults:  Neurology    Significant Diagnostic Studies:  Ct Head Wo Contrast  Result  Date: 03/14/2017 CLINICAL DATA:  LEFT arm weakness and numbness 2130 hrs last night, unable to hold anything in LEFT hand, keeps dropping everything in LEFT hand, history hypertension, diabetes mellitus EXAM: CT HEAD WITHOUT CONTRAST TECHNIQUE: Contiguous axial images were obtained from the base of the skull through the vertex without intravenous contrast. Sagittal and coronal MPR images reconstructed from axial data set. COMPARISON:  None FINDINGS: Brain: Normal ventricular morphology. No midline shift or mass effect. Small area of slightly decreased attenuation in the RIGHT parietal lobe consistent with acute infarct. No intracranial hemorrhage or mass lesion. No additional areas of infarction identified. Posterior fossa unremarkable. No extra-axial fluid collections. Vascular: Atherosclerotic calcification of internal carotid arteries bilaterally at skullbase. Skull: Intact Sinuses/Orbits: Clear Other: N/A IMPRESSION: Acute nonhemorrhagic cortical infarct at high RIGHT parietal lobe. Electronically Signed   By: Lavonia Dana M.D.   On: 03/14/2017 07:02   Ct Angio Neck W Or Wo Contrast  Result Date: 03/15/2017 CLINICAL DATA:  Stroke with left arm numbness. EXAM: CT ANGIOGRAPHY NECK TECHNIQUE: Multidetector CT imaging of the neck was performed using the standard protocol  during bolus administration of intravenous contrast. Multiplanar CT image reconstructions and MIPs were obtained to evaluate the vascular anatomy. Carotid stenosis measurements (when applicable) are obtained utilizing NASCET criteria, using the distal internal carotid diameter as the denominator. CONTRAST:  50 cc Isovue 370 intravenous COMPARISON:  None. FINDINGS: Aortic arch: Normal appearance of the arch.  Three vessel branching. Right carotid system: Smooth and widely patent. Common carotid tortuosity with partial retropharyngeal course. Negative for beading. Left carotid system: Smooth and widely patent. Common carotid tortuosity with partial  retropharyngeal course. Negative for beading. Vertebral arteries: No proximal subclavian stenosis. Both vertebral arteries are smooth and widely patent to the dura. Skeleton: No acute or aggressive finding. Facet arthropathy with slight anterolisthesis at C3-4. Other neck: No incidental mass or inflammation. Upper chest: No acute finding. IMPRESSION: Negative CTA of the neck. No stenosis, atherosclerosis, or embolic source. Electronically Signed   By: Monte Fantasia M.D.   On: 03/15/2017 11:34   Mr Brain Wo Contrast  Result Date: 03/14/2017 CLINICAL DATA:  66 year old female with acute onset left arm weakness, slurred speech, left facial droop. EXAM: MRI HEAD WITHOUT CONTRAST MRA HEAD WITHOUT CONTRAST TECHNIQUE: Multiplanar, multiecho pulse sequences of the brain and surrounding structures were obtained without intravenous contrast. Angiographic images of the head were obtained using MRA technique without contrast. COMPARISON:  Head CT 0634 hours today. FINDINGS: MRI HEAD FINDINGS Brain: Restricted diffusion along the lateral right motor strip tracking to the right frontal operculum (series 9, image 17). Associated gyral edema. No associated hemorrhage or mass effect. No other restricted diffusion. No midline shift, mass effect, evidence of mass lesion, ventriculomegaly, extra-axial collection or acute intracranial hemorrhage. Cervicomedullary junction and pituitary are within normal limits. There is mild for age scattered nonspecific cerebral white matter T2 and FLAIR hyperintensity. There are several subtle chronic lacunar infarcts in the cerebellum (series 8, image 8). And there is mild nonspecific T2 heterogeneity in both globus pallidus. No cortical encephalomalacia or chronic cerebral blood products identified. Vascular: Major intracranial vascular flow voids are preserved. Skull and upper cervical spine: Negative. Visualized bone marrow signal is within normal limits. Sinuses/Orbits: Normal orbit soft  tissues. Visualized paranasal sinuses and mastoids are stable and well pneumatized. Other: Visible internal auditory structures appear normal. Negative scalp soft tissues. MRA HEAD FINDINGS Antegrade flow in the posterior circulation with codominant distal vertebral arteries. No distal vertebral stenosis. Normal basilar artery. AICA, SCA, and PCA origins are normal (the left PCA origin is fetal type). The right posterior communicating artery is diminutive. Bilateral PCA branches are within normal limits. Antegrade flow in both ICA siphons. No siphon stenosis. Mildly tortuous cavernous ICAs. Ophthalmic and posterior communicating artery origins are normal. Patent carotid termini. Normal MCA and ACA origins. Anterior communicating artery and visible ACA branches are within normal limits. Left MCA branches are within normal limits. Right MCA M1 segment, right MCA bifurcation, and visible right MCA branches are within normal limits. No right MCA branch occlusion identified. IMPRESSION: 1. Acute infarct along the right lateral motor strip corresponding to the noncontrast CT finding earlier today. Gyral cytotoxic edema but no associated hemorrhage or mass effect. 2.  Negative intracranial MRA. 3. Evidence of mild for age underlying small vessel disease. Electronically Signed   By: Genevie Ann M.D.   On: 03/14/2017 21:13   Mr Jodene Nam Head/brain ZO Cm  Result Date: 03/14/2017 CLINICAL DATA:  66 year old female with acute onset left arm weakness, slurred speech, left facial droop. EXAM: MRI HEAD WITHOUT CONTRAST MRA HEAD WITHOUT  CONTRAST TECHNIQUE: Multiplanar, multiecho pulse sequences of the brain and surrounding structures were obtained without intravenous contrast. Angiographic images of the head were obtained using MRA technique without contrast. COMPARISON:  Head CT 0634 hours today. FINDINGS: MRI HEAD FINDINGS Brain: Restricted diffusion along the lateral right motor strip tracking to the right frontal operculum (series 9,  image 17). Associated gyral edema. No associated hemorrhage or mass effect. No other restricted diffusion. No midline shift, mass effect, evidence of mass lesion, ventriculomegaly, extra-axial collection or acute intracranial hemorrhage. Cervicomedullary junction and pituitary are within normal limits. There is mild for age scattered nonspecific cerebral white matter T2 and FLAIR hyperintensity. There are several subtle chronic lacunar infarcts in the cerebellum (series 8, image 8). And there is mild nonspecific T2 heterogeneity in both globus pallidus. No cortical encephalomalacia or chronic cerebral blood products identified. Vascular: Major intracranial vascular flow voids are preserved. Skull and upper cervical spine: Negative. Visualized bone marrow signal is within normal limits. Sinuses/Orbits: Normal orbit soft tissues. Visualized paranasal sinuses and mastoids are stable and well pneumatized. Other: Visible internal auditory structures appear normal. Negative scalp soft tissues. MRA HEAD FINDINGS Antegrade flow in the posterior circulation with codominant distal vertebral arteries. No distal vertebral stenosis. Normal basilar artery. AICA, SCA, and PCA origins are normal (the left PCA origin is fetal type). The right posterior communicating artery is diminutive. Bilateral PCA branches are within normal limits. Antegrade flow in both ICA siphons. No siphon stenosis. Mildly tortuous cavernous ICAs. Ophthalmic and posterior communicating artery origins are normal. Patent carotid termini. Normal MCA and ACA origins. Anterior communicating artery and visible ACA branches are within normal limits. Left MCA branches are within normal limits. Right MCA M1 segment, right MCA bifurcation, and visible right MCA branches are within normal limits. No right MCA branch occlusion identified. IMPRESSION: 1. Acute infarct along the right lateral motor strip corresponding to the noncontrast CT finding earlier today. Gyral  cytotoxic edema but no associated hemorrhage or mass effect. 2.  Negative intracranial MRA. 3. Evidence of mild for age underlying small vessel disease. Electronically Signed   By: Genevie Ann M.D.   On: 03/14/2017 21:13    echocardiogram   LV EF: 55% -   60%  ------------------------------------------------------------------- Indications:      CVA 436.  ------------------------------------------------------------------- History:   Risk factors:  Hypertension. Diabetes mellitus. Dyslipidemia.  ------------------------------------------------------------------- Study Conclusions  - Left ventricle: The cavity size was normal. Wall thickness was   normal. Systolic function was normal. The estimated ejection   fraction was in the range of 55% to 60%. Doppler parameters are   consistent with abnormal left ventricular relaxation (grade 1   diastolic dysfunction).   TEE  Left Ventrical:  Normal LV   Mitral Valve: normal   Aortic Valve: normal  Tricuspid Valve: normal   Pulmonic Valve: poorly visualized   Left Atrium/ Left atrial appendage: small,  No thrombus   Atrial septum: + PFO by bubble study   Aorta: mild atherosclerosis     Filed Weights   03/14/17 0546  Weight: 122 kg (269 lb)     Microbiology: No results found for this or any previous visit (from the past 240 hour(s)).     Blood Culture    Component Value Date/Time   SDES URINE, RANDOM 12/07/2016 0038   SPECREQUEST NONE 12/07/2016 0038   CULT MULTIPLE SPECIES PRESENT, SUGGEST RECOLLECTION (A) 12/07/2016 0038   REPTSTATUS 12/08/2016 FINAL 12/07/2016 0038      Labs: Results for orders placed  or performed during the hospital encounter of 03/14/17 (from the past 48 hour(s))  Glucose, capillary     Status: Abnormal   Collection Time: 03/15/17 11:06 AM  Result Value Ref Range   Glucose-Capillary 161 (H) 65 - 99 mg/dL   Comment 1 Notify RN    Comment 2 Document in Chart   Rapid urine drug  screen (hospital performed)     Status: None   Collection Time: 03/15/17  1:28 PM  Result Value Ref Range   Opiates NONE DETECTED NONE DETECTED   Cocaine NONE DETECTED NONE DETECTED   Benzodiazepines NONE DETECTED NONE DETECTED   Amphetamines NONE DETECTED NONE DETECTED   Tetrahydrocannabinol NONE DETECTED NONE DETECTED   Barbiturates NONE DETECTED NONE DETECTED    Comment:        DRUG SCREEN FOR MEDICAL PURPOSES ONLY.  IF CONFIRMATION IS NEEDED FOR ANY PURPOSE, NOTIFY LAB WITHIN 5 DAYS.        LOWEST DETECTABLE LIMITS FOR URINE DRUG SCREEN Drug Class       Cutoff (ng/mL) Amphetamine      1000 Barbiturate      200 Benzodiazepine   371 Tricyclics       062 Opiates          300 Cocaine          300 THC              50   Glucose, capillary     Status: Abnormal   Collection Time: 03/15/17  4:46 PM  Result Value Ref Range   Glucose-Capillary 111 (H) 65 - 99 mg/dL   Comment 1 Notify RN    Comment 2 Document in Chart   Glucose, capillary     Status: Abnormal   Collection Time: 03/15/17  9:09 PM  Result Value Ref Range   Glucose-Capillary 109 (H) 65 - 99 mg/dL   Comment 1 Notify RN    Comment 2 Document in Chart   Basic metabolic panel     Status: Abnormal   Collection Time: 03/16/17  3:57 AM  Result Value Ref Range   Sodium 138 135 - 145 mmol/L   Potassium 3.3 (L) 3.5 - 5.1 mmol/L   Chloride 107 101 - 111 mmol/L   CO2 23 22 - 32 mmol/L   Glucose, Bld 110 (H) 65 - 99 mg/dL   BUN 18 6 - 20 mg/dL   Creatinine, Ser 1.20 (H) 0.44 - 1.00 mg/dL   Calcium 9.4 8.9 - 10.3 mg/dL   GFR calc non Af Amer 47 (L) >60 mL/min   GFR calc Af Amer 54 (L) >60 mL/min    Comment: (NOTE) The eGFR has been calculated using the CKD EPI equation. This calculation has not been validated in all clinical situations. eGFR's persistently <60 mL/min signify possible Chronic Kidney Disease.    Anion gap 8 5 - 15  CBC     Status: Abnormal   Collection Time: 03/16/17  3:57 AM  Result Value Ref Range    WBC 4.8 4.0 - 10.5 K/uL   RBC 3.65 (L) 3.87 - 5.11 MIL/uL   Hemoglobin 10.3 (L) 12.0 - 15.0 g/dL   HCT 32.5 (L) 36.0 - 46.0 %   MCV 89.0 78.0 - 100.0 fL   MCH 28.2 26.0 - 34.0 pg   MCHC 31.7 30.0 - 36.0 g/dL   RDW 16.9 (H) 11.5 - 15.5 %   Platelets 94 (L) 150 - 400 K/uL    Comment: REPEATED TO VERIFY PLATELET  COUNT CONFIRMED BY SMEAR   Glucose, capillary     Status: Abnormal   Collection Time: 03/16/17  6:04 AM  Result Value Ref Range   Glucose-Capillary 110 (H) 65 - 99 mg/dL   Comment 1 Notify RN    Comment 2 Document in Chart   Glucose, capillary     Status: Abnormal   Collection Time: 03/16/17 11:13 AM  Result Value Ref Range   Glucose-Capillary 136 (H) 65 - 99 mg/dL   Comment 1 Notify RN    Comment 2 Document in Chart   Glucose, capillary     Status: Abnormal   Collection Time: 03/16/17  4:59 PM  Result Value Ref Range   Glucose-Capillary 102 (H) 65 - 99 mg/dL   Comment 1 Notify RN    Comment 2 Document in Chart   Glucose, capillary     Status: Abnormal   Collection Time: 03/16/17  9:25 PM  Result Value Ref Range   Glucose-Capillary 125 (H) 65 - 99 mg/dL   Comment 1 Notify RN    Comment 2 Document in Chart   Basic metabolic panel     Status: Abnormal   Collection Time: 03/17/17  4:35 AM  Result Value Ref Range   Sodium 138 135 - 145 mmol/L   Potassium 3.8 3.5 - 5.1 mmol/L   Chloride 108 101 - 111 mmol/L   CO2 23 22 - 32 mmol/L   Glucose, Bld 111 (H) 65 - 99 mg/dL   BUN 20 6 - 20 mg/dL   Creatinine, Ser 1.26 (H) 0.44 - 1.00 mg/dL   Calcium 9.4 8.9 - 10.3 mg/dL   GFR calc non Af Amer 44 (L) >60 mL/min   GFR calc Af Amer 51 (L) >60 mL/min    Comment: (NOTE) The eGFR has been calculated using the CKD EPI equation. This calculation has not been validated in all clinical situations. eGFR's persistently <60 mL/min signify possible Chronic Kidney Disease.    Anion gap 7 5 - 15  Glucose, capillary     Status: Abnormal   Collection Time: 03/17/17  6:11 AM   Result Value Ref Range   Glucose-Capillary 114 (H) 65 - 99 mg/dL   Comment 1 Notify RN    Comment 2 Document in Chart      Lipid Panel     Component Value Date/Time   CHOL 125 03/15/2017 0402   TRIG 284 (H) 03/15/2017 0402   HDL 23 (L) 03/15/2017 0402   CHOLHDL 5.4 03/15/2017 0402   VLDL 57 (H) 03/15/2017 0402   LDLCALC 45 03/15/2017 0402   LDLDIRECT 37.0 12/18/2016 1240     Lab Results  Component Value Date   HGBA1C 5.4 03/15/2017   HGBA1C 5.4 12/18/2016   HGBA1C 5.7 02/13/2016      HPI   66 y.o.femalewith a history of rheumatic fever, hypertension (inconsistently takes her blood pressure medicine), diabetes mellitus and hyperlipidemia who presented with left arm weakness and slurred speech on 03/14/2017.  The patient noted that she kept dropping objects with her left hand, and her husband noted that she was slurring her words.  They waited to see if her symptoms would improve, and drove to the ED on the morning of 03/14/2017 when they did not.  Of note, the patient has as left foot drop at baseline as a complication of her bilateral knee arthoplasty, and was using a walker at home prior to admission.  That patient underwent laparoscopic cholecystectomy in March 2018.  CT head with  acute high right cortical parietal infarct.  MR with right lateral motor strip infarct with cytotoxic edema.  Negative MRA and CTA head.   Patient was not administered IV t-PA secondary to arriving outside of the treatment window. She was admitted to General Neurology for further evaluation and treatment.  HOSPITAL COURSE:    Stroke: Acute right MCA cortical infarct likely embolic, etiology unclear. Resultant  left hand dexterity difficulty.CT head: Acute cortical infarct at high RIGHT parietal lobe.MRI head:  Acute infarct along the right lateral motor strip  ,MRA head Negative intracranial MRA. CTA neck unremarkable.2D Echo: EF 55-60%.TEE positive PFO but otherwise normal. Neurology Recommended  loop  recorder to rule out afib ,if not done inpatient ,this will be done as outpatient . LE venous Doppler: no DVT. LDL: 45,HgbA1c: 5.4.  No antithrombotic prior to admission, now on aspirin 325 mg daily. Continue aspirin 325  Mg  at discharge. Therapy recommendations: Home health OT /PT   DM2 (diabetes mellitus, type 2) ([place]): Last hemoglobin A1c was 5.4. Blood glucose 126 fasting. Continue home oral hypoglycemic regimen  Hyperlipidemia LDL goal <70 Continue  Crestor. LDL 45  Severe obesity (BMI >= 40) (HCC)  Essential hypertension Allow permissive hypertension. Hold antihypertensive medication.  AKI (acute kidney injury) ([place]) With baseline creatinine around 1. Slightly above baseline at 1.26 Diovan HCTZ was held, may be resumed if renal function is stable on follow-up   Hypokalemia: Repleted orally.  Discharge Exam: *  Blood pressure (!) 115/55, pulse (!) 59, temperature 98.2 F (36.8 C), temperature source Oral, resp. rate 20, height _0  (1.676 m), weight 122 kg (269 lb), SpO2 96 %.  General exam: In no acute distress Respiratory system: Good air movement and clear to auscultation. Cardiovascular system: Irregular rate and rhythm with positive S1-S2 no JVD Gastrointestinal system: Other bowel sounds soft nontender nondistended. Central nervous system: Awake alert and oriented 3, 3-12 are grossly intact sensation is intact throughout muscle strength is 4 out of 5 on deltoid and bicep on the left upper extremity. Deep tendon reflexes are 2+ bilateral and symmetrical. No ataxia. Extremities: No lower extremity edema.    Follow-up Information    Ann Held, DO. Call.   Specialty:  Family Medicine Why:  hospital follow-up in 3-5 days Contact information: [address] [place] [address] [phone]        Rosalin Hawking, MD. Call.   Specialty:  Neurology Why:  To make appointment, follow-up in 4-6 weeks Contact information: [address] [place] [address] [phone]           Signed: Reyne Dumas 03/17/2017, 9:41 AM        Time spent >1 hour

## 2017-03-17 NOTE — Progress Notes (Signed)
STROKE TEAM PROGRESS NOTE   SUBJECTIVE (INTERVAL HISTORY) Her husband is at the bedside. No neuro change. TEE done yesterday showed no vulvular disease. but positive PFO. Loop pending.    OBJECTIVE Temp:  [98 F (36.7 C)-98.9 F (37.2 C)] 98.3 F (36.8 C) (07/04 0958) Pulse Rate:  [59-99] 69 (07/04 0958) Cardiac Rhythm: Normal sinus rhythm;Heart block (07/04 0700) Resp:  [14-20] 20 (07/04 0958) BP: (92-154)/(49-96) 106/51 (07/04 0958) SpO2:  [93 %-100 %] 95 % (07/04 0958)  CBC:   Recent Labs Lab 03/14/17 0550 03/14/17 0644 03/16/17 0357  WBC 5.3  --  4.8  NEUTROABS 3.5  --   --   HGB 11.2* 11.6* 10.3*  HCT 34.7* 34.0* 32.5*  MCV 88.3  --  89.0  PLT 99*  --  94*    Basic Metabolic Panel:   Recent Labs Lab 03/16/17 0357 03/17/17 0435  NA 138 138  K 3.3* 3.8  CL 107 108  CO2 23 23  GLUCOSE 110* 111*  BUN 18 20  CREATININE 1.20* 1.26*  CALCIUM 9.4 9.4    Lipid Panel:     Component Value Date/Time   CHOL 125 03/15/2017 0402   TRIG 284 (H) 03/15/2017 0402   HDL 23 (L) 03/15/2017 0402   CHOLHDL 5.4 03/15/2017 0402   VLDL 57 (H) 03/15/2017 0402   LDLCALC 45 03/15/2017 0402   HgbA1c:  Lab Results  Component Value Date   HGBA1C 5.4 03/15/2017   Urine Drug Screen:     Component Value Date/Time   LABOPIA NONE DETECTED 03/15/2017 1328   COCAINSCRNUR NONE DETECTED 03/15/2017 1328   LABBENZ NONE DETECTED 03/15/2017 1328   AMPHETMU NONE DETECTED 03/15/2017 1328   THCU NONE DETECTED 03/15/2017 1328   LABBARB NONE DETECTED 03/15/2017 1328    Alcohol Level No results found for: [place] I have personally reviewed the radiological images below and agree with the radiology interpretations.  Ct Head Wo Contrast 03/14/2017 IMPRESSION: Acute nonhemorrhagic cortical infarct at high RIGHT parietal lobe.  Ct Angio Neck W Or Wo Contrast 03/15/2017 IMPRESSION: Negative CTA of the neck. No stenosis, atherosclerosis, or embolic source.  Mr Brain 79 Contrast Mr  Jodene Nam Head/brain Wo Cm 03/14/2017 IMPRESSION: 1. Acute infarct along the right lateral motor strip corresponding to the noncontrast CT finding earlier. Gyral cytotoxic edema but no associated hemorrhage or mass effect. 2.  Negative intracranial MRA. 3. Evidence of mild for age underlying small vessel disease.  TTE - Left ventricle: The cavity size was normal. Wall thickness was normal. Systolic function was normal. The estimated ejection fraction was in the range of 55% to 60%. Doppler parameters are consistent with abnormal left ventricular relaxation (grade 1 diastolic dysfunction).  LE venous Doppler 03/15/2017 No evidence of deep vein or superficial thrombosis involving the right lower extremity and left lower extremity.  TEE 03/16/2017 Left Ventrical:  Normal LV  Mitral Valve: normal  Aortic Valve: normal Tricuspid Valve: normal  Pulmonic Valve: poorly visualized  Left Atrium/ Left atrial appendage: small,  No thrombus  Atrial septum: + PFO by bubble study  Aorta: mild atherosclerosis    PHYSICAL EXAM  Temp:  [98 F (36.7 C)-98.9 F (37.2 C)] 98.3 F (36.8 C) (07/04 0958) Pulse Rate:  [59-99] 69 (07/04 0958) Resp:  [14-20] 20 (07/04 0958) BP: (92-154)/(49-96) 106/51 (07/04 0958) SpO2:  [93 %-100 %] 95 % (07/04 0958)  General - Well nourished, well developed, in no apparent distress.  Ophthalmologic - Sharp disc margins OU.  Cardiovascular -  Regular rate and rhythm.  Mental Status -  Level of arousal and orientation to time, place, and person were intact. Language including expression, naming, repetition, comprehension was assessed and found intact. Fund of Knowledge was assessed and was intact.  Cranial Nerves II - XII - II - Visual field intact OU. III, IV, VI - Extraocular movements intact. V - Facial sensation intact bilaterally. VII - subtle left nasolabial fold flattening. VIII - Hearing & vestibular intact bilaterally. X - Palate elevates symmetrically. XI - Chin  turning & shoulder shrug intact bilaterally. XII - Tongue protrusion intact.  Motor Strength - The patient's strength was normal in all extremities except left hand mild dexterity difficulties, and chronic left foot drop with DF 3-/5 and pronator drift was absent.  Bulk was normal and fasciculations were absent.   Motor Tone - Muscle tone was assessed at the neck and appendages and was normal.  Reflexes - The patient's reflexes were 1+ in all extremities and she had no pathological reflexes.  Sensory - Light touch, temperature/pinprick were assessed and were symmetrical.    Coordination - The patient had normal movements in the hands with no ataxia or dysmetria.  Tremor was absent.  Gait and Station - walks with walker, slow, but steady gait.   ASSESSMENT/PLAN [name] is a 66 y.o. female with history of rheumatic fever, hypertension (inconsistently takes her blood pressure medicine), diabetes mellitus and hyperlipidemia presenting with left arm weakness and slurred speech. She did not receive IV t-PA due to arriving outside of the treatment window.   Stroke: Acute right MCA cortical infarct likely embolic, etiology unclear.   Resultant improved left hand dexterity difficulty  CT head: Acute cortical infarct at high RIGHT parietal lobe.  MRI head:  Acute infarct along the right lateral motor strip    MRA head Negative intracranial MRA.  CTA neck unremarkable  2D Echo: EF 55-60%  TEE positive PFO but otherwise normal  Loop recorder pending to rule out afib.   LE venous Doppler: no DVT  LDL: 45  HgbA1c: 5.4  SCDs for VTE prophylaxis Diet Carb Modified Fluid consistency: Thin; Room service appropriate? Yes Diet - low sodium heart healthy Diet - low sodium heart healthy Diet NPO time specified  No antithrombotic prior to admission, now on aspirin 325 mg daily. Continue aspirin at discharge.  Patient counseled to be compliant with her antithrombotic  medications  Ongoing aggressive stroke risk factor management  Therapy recommendations: Home health OT;Supervision - Intermittent   Disposition:  Pending  History of rheumatic fever in childhood  No significant cardiac issue since then  TTE unremarkable  TEE unremarkable vulves but positive PFO noted  Hypertension  Stable  Permissive hypertension (OK if < 220/120) but gradually normalize in 5-7 days  Long-term BP goal normotensive  Diabetes  HgbA1c 5.4, goal < 7.0  Controlled  CBG monitoring  Hyperlipidemia   Home medication - Crestor and fenofibrate  LDL 45, goal < 70  Continue Crestor  Continue fenofibrate  Other Stroke Risk Factors  Morbid obesity, Body mass index is 43.42 kg/m., recommend weight loss, diet and exercise as appropriate   Family hx stroke (father)  Other Active Problems  S/p bilateral knee surgery  Chronic left foot drop after left knee surgery  Hospital day # 2  Rosalin Hawking, MD PhD Stroke Neurology 03/17/2017 12:05 PM    To contact Stroke Continuity provider, please refer to [email or web address] After hours, contact General Neurology

## 2017-03-17 NOTE — Progress Notes (Signed)
Physical Therapy Treatment Patient Details Name: Rachel Black MRN: [id number] DOB: 1951-04-08 Today's Date: 03/17/2017    History of Present Illness Pt is a 66 y.o. female past medical history significant for essential hypertension, diabetes mellitus type 2 and hyperlipidemia who noticed the day before admission she started having left arm weakness. She called her husband who noticed her face history of seizures brought here into the ED. CT scan of the head showed a nonhemorrhagic cortical infarct in the right parietal lobe. Acute infarct on the right lateral motor strip per MRI.    PT Comments    Pt progressing well towards current functional goals. Pt would continue to benefit from skilled physical therapy services at this time while admitted and after d/c to address the below listed limitations in order to improve overall safety and independence with functional mobility.    Follow Up Recommendations  Home health PT     Equipment Recommendations  None recommended by PT    Recommendations for Other Services       Precautions / Restrictions Precautions Precautions: Fall Restrictions Weight Bearing Restrictions: No    Mobility  Bed Mobility Overal bed mobility: Modified Independent                Transfers Overall transfer level: Needs assistance Equipment used: Rolling walker (2 wheeled) Transfers: Sit to/from Stand Sit to Stand: Supervision         General transfer comment: supervision for safety  Ambulation/Gait Ambulation/Gait assistance: Supervision Ambulation Distance (Feet): 150 Feet Assistive device: Rolling walker (2 wheeled) Gait Pattern/deviations: Step-through pattern;Decreased dorsiflexion - left;Wide base of support;Trunk flexed Gait velocity: decreased Gait velocity interpretation: Below normal speed for age/gender General Gait Details: pt with modest instability but no overt LOB or need for physical assistance, supervision for safety with use  of RW   Stairs            Wheelchair Mobility    Modified Rankin (Stroke Patients Only) Modified Rankin (Stroke Patients Only) Pre-Morbid Rankin Score: No symptoms Modified Rankin: Moderately severe disability     Balance Overall balance assessment: Needs assistance Sitting-balance support: Feet supported Sitting balance-Leahy Scale: Good     Standing balance support: During functional activity;No upper extremity supported Standing balance-Leahy Scale: Fair                              Cognition Arousal/Alertness: Awake/alert Behavior During Therapy: WFL for tasks assessed/performed Overall Cognitive Status: Within Functional Limits for tasks assessed                                        Exercises      General Comments        Pertinent Vitals/Pain Pain Assessment: No/denies pain    Home Living                      Prior Function            PT Goals (current goals can now be found in the care plan section) Acute Rehab PT Goals PT Goal Formulation: With patient/family Time For Goal Achievement: 03/29/17 Potential to Achieve Goals: Good Progress towards PT goals: Progressing toward goals    Frequency    Min 3X/week      PT Plan Current plan remains appropriate    Co-evaluation  AM-PAC PT "6 Clicks" Daily Activity  Outcome Measure  Difficulty turning over in bed (including adjusting bedclothes, sheets and blankets)?: None Difficulty moving from lying on back to sitting on the side of the bed? : None Difficulty sitting down on and standing up from a chair with arms (e.g., wheelchair, bedside commode, etc,.)?: A Little Help needed moving to and from a bed to chair (including a wheelchair)?: A Little Help needed walking in hospital room?: A Little Help needed climbing 3-5 steps with a railing? : A Little 6 Click Score: 20    End of Session Equipment Utilized During Treatment: Gait  belt Activity Tolerance: Patient tolerated treatment well Patient left: in bed;with call bell/phone within reach Nurse Communication: Mobility status PT Visit Diagnosis: Unsteadiness on feet (R26.81);Difficulty in walking, not elsewhere classified (R26.2)     Time: 5686-1683 PT Time Calculation (min) (ACUTE ONLY): 27 min  Charges:  $Gait Training: 8-22 mins $Therapeutic Activity: 8-22 mins                    G Codes:       [place], [place], [place] 03/17/2017, 5:05 PM

## 2017-03-18 ENCOUNTER — Encounter (HOSPITAL_COMMUNITY)
Admission: EM | Disposition: A | Discharge status: Home / Self Care | Payer: Self-pay | Source: Home / Self Care | Attending: Internal Medicine

## 2017-03-18 ENCOUNTER — Encounter (HOSPITAL_COMMUNITY): Payer: Self-pay | Admitting: Cardiology

## 2017-03-18 DIAGNOSIS — I638 Other cerebral infarction: Secondary | ICD-10-CM

## 2017-03-18 HISTORY — PX: LOOP RECORDER INSERTION: EP1214

## 2017-03-18 LAB — GLUCOSE, CAPILLARY
Glucose-Capillary: 111 mg/dL — ABNORMAL HIGH (ref 65–99)
Glucose-Capillary: 142 mg/dL — ABNORMAL HIGH (ref 65–99)

## 2017-03-18 LAB — BASIC METABOLIC PANEL
Anion gap: 8 (ref 5–15)
BUN: 17 mg/dL (ref 6–20)
CALCIUM: 9.8 mg/dL (ref 8.9–10.3)
CO2: 24 mmol/L (ref 22–32)
CREATININE: 1.23 mg/dL — AB (ref 0.44–1.00)
Chloride: 108 mmol/L (ref 101–111)
GFR calc Af Amer: 53 mL/min — ABNORMAL LOW (ref >60)
GFR, EST NON AFRICAN AMERICAN: 45 mL/min — AB (ref >60)
GLUCOSE: 116 mg/dL — AB (ref 65–99)
Potassium: 3.8 mmol/L (ref 3.5–5.1)
Sodium: 140 mmol/L (ref 135–145)

## 2017-03-18 SURGERY — LOOP RECORDER INSERTION

## 2017-03-18 MED ORDER — LIDOCAINE-EPINEPHRINE 1 %-1:100000 IJ SOLN
INTRAMUSCULAR | Status: AC
  Filled 2017-03-18: qty 1

## 2017-03-18 MED ORDER — LIDOCAINE-EPINEPHRINE 1 %-1:100000 IJ SOLN
INTRAMUSCULAR | Status: DC | PRN
  Administered 2017-03-18: 20 mL

## 2017-03-18 SURGICAL SUPPLY — 2 items
LOOP REVEAL LINQSYS (Prosthesis & Implant Heart) ×2 IMPLANT
PACK LOOP INSERTION (CUSTOM PROCEDURE TRAY) ×2 IMPLANT

## 2017-03-18 NOTE — Consult Note (Signed)
ELECTROPHYSIOLOGY CONSULT NOTE  Patient ID: Rachel Black MRN: [id number], DOB/AGE: 1951/05/19   Admit date: 03/14/2017 Date of Consult: 03/18/2017  Primary Physician: Carollee Herter, Alferd Apa, DO Primary Cardiologist: new to [facility] Reason for Consultation: Cryptogenic stroke; recommendations regarding Implantable Loop Recorder  History of Present Illness Rachel Black is a 66 y.o. female whom EP has been asked to evaluate for placement of ILR in the setting of cryptogenic stroke. She was admitted on 03/14/2017 with left arm weakness and slurred speech.  They first developed symptoms the night prior to admission while she was trying to open a bag of nuts.  Imaging demonstrated acute right MCA cortical infarct felt to be embolic 2/2 unknown source.  She has undergone workup for stroke including echocardiogram and carotid dopplers.  The patient has been monitored on telemetry which has demonstrated sinus rhythm with no arrhythmias.  TEE demonstrated PFO, LE venous dopplers negative.  Echocardiogram this admission demonstrated EF 01-74%, grade 1 diastolic dysfunction, LA 47.  Lab work is reviewed.  Prior to admission, the patient denies chest pain, shortness of breath, dizziness, palpitations, or syncope.    EP has been asked to evaluate for placement of an implantable loop recorder to monitor for atrial fibrillation.   Past Medical History:  Diagnosis Date  . Arthritis    PAIN AND OA BOTH KNEES AND SHOULDERS AND ELBOWS AND WRIST  . Blood transfusion without reported diagnosis 2018   after cholecystectomy  . Diabetes mellitus    ORAL MEDICATION  . GERD (gastroesophageal reflux disease)   . Gout    NO RECENT FLARE UPS  . H/O: rheumatic fever    AS A CHILD - NO KNOWN HEART MURMUR OR HEART PROBLEMS  . Hyperlipidemia   . Hypertension   . Shortness of breath    WITH EXERTION     Surgical History:  Past Surgical History:  Procedure Laterality Date  . ABDOMINAL HYSTERECTOMY    .  CESAREAN SECTION    . CHOLECYSTECTOMY N/A 12/09/2016   Procedure: LAPAROSCOPIC CHOLECYSTECTOMY WITH INTRAOPERATIVE CHOLANGIOGRAM;  Surgeon: Stark Klein, MD;  Location: [facility];  Service: General;  Laterality: N/A;  . CHOLECYSTECTOMY  12/09/2016  . COLONOSCOPY WITH PROPOFOL N/A 01/30/2013   Procedure: COLONOSCOPY WITH PROPOFOL;  Surgeon: Irene Shipper, MD;  Location: [facility];  Service: Endoscopy;  Laterality: N/A;  . ERCP N/A 12/08/2016   Procedure: ENDOSCOPIC RETROGRADE CHOLANGIOPANCREATOGRAPHY (ERCP);  Surgeon: Ladene Artist, MD;  Location: [facility];  Service: Endoscopy;  Laterality: N/A;  . ESOPHAGOGASTRODUODENOSCOPY (EGD) WITH PROPOFOL N/A 01/30/2013   Procedure: ESOPHAGOGASTRODUODENOSCOPY (EGD) WITH PROPOFOL;  Surgeon: Irene Shipper, MD;  Location: [facility];  Service: Endoscopy;  Laterality: N/A;  . Gallstone Surgeery  12/08/2016  . PARTIAL HYSTERECTOMY    . TEE WITHOUT CARDIOVERSION N/A 03/16/2017   Procedure: TRANSESOPHAGEAL ECHOCARDIOGRAM (TEE);  Surgeon: Acie Fredrickson Wonda Cheng, MD;  Location: [facility] END[facility];  Service: Cardiovascular;  Laterality: N/A;  . TOTAL KNEE ARTHROPLASTY Right 04/20/2014   Procedure: RIGHT TOTAL KNEE ARTHROPLASTY CONVERTED TO RIGHT KNEE REIMPLANTATION;  Surgeon: Mcarthur Rossetti, MD;  Location: [facility];  Service: Orthopedics;  Laterality: Right;  . TOTAL KNEE ARTHROPLASTY Left 08/23/2015   Procedure: LEFT TOTAL KNEE ARTHROPLASTY;  Surgeon: Mcarthur Rossetti, MD;  Location: [facility];  Service: Orthopedics;  Laterality: Left;  Marland Kitchen VENTRAL HERNIA REPAIR     x2     Facility-Administered Medications Prior to Admission  Medication Dose Route Frequency Provider Last Rate Last Dose  . [  DISCONTINUED] 0.9 %  sodium chloride infusion  500 mL Intravenous Continuous Irene Shipper, MD       Prescriptions Prior to Admission  Medication Sig Dispense Refill Last Dose  . allopurinol (ZYLOPRIM) 300 MG tablet TAKE 1 TABLET BY MOUTH 2 TIMES DAILY 180 tablet 0 03/13/2017 at  Unknown time  . calcium carbonate (TUMS - DOSED IN MG ELEMENTAL CALCIUM) 500 MG chewable tablet Chew 2 tablets by mouth 3 (three) times daily as needed for indigestion or heartburn.   03/13/2017 at Unknown time  . calcium-vitamin D (OSCAL WITH D) 500-200 MG-UNIT per tablet Take 1 tablet by mouth daily with breakfast.    03/13/2017 at Unknown time  . DEXILANT 60 MG capsule TAKE 1 CAPSULE (60 MG TOTAL) BY MOUTH EVERY MORNING 90 capsule 3 03/13/2017 at Unknown time  . fenofibrate 160 MG tablet TAKE 1 TABLET (160 MG TOTAL) BY MOUTH DAILY. 90 tablet 1 03/13/2017 at Unknown time  . ferrous sulfate 325 (65 FE) MG EC tablet Take 1 tablet (325 mg total) by mouth 2 (two) times daily. 60 tablet 11 03/13/2017 at Unknown time  . JANUMET XR [phone] MG TB24 TAKE 1 TABLET BY MOUTH EVERY DAY 90 tablet 1 03/13/2017 at Unknown time  . potassium chloride SA (K-DUR,KLOR-CON) 20 MEQ tablet Take 1 tablet (20 mEq total) by mouth daily. 90 tablet 1 03/13/2017 at Unknown time  . ranitidine (ZANTAC) 150 MG tablet TAKE 1 TABLET (150 MG TOTAL) BY MOUTH AT BEDTIME. (Patient taking differently: Take 150 mg by mouth 2 (two) times daily. ) 30 tablet 3 03/13/2017 at Unknown time  . [DISCONTINUED] rosuvastatin (CRESTOR) 20 MG tablet TAKE 1 TABLET (20 MG TOTAL) BY MOUTH DAILY. 90 tablet 1 03/13/2017 at Unknown time  . [DISCONTINUED] valsartan-hydrochlorothiazide (DIOVAN-HCT) 80-12.5 MG tablet TAKE 1 TABLET BY MOUTH DAILY. 90 tablet 0 03/13/2017 at 0900  . ONE TOUCH ULTRA TEST test strip ONETOUCH ULTRA BLUE TEST STRIPS CHECK BLOOD SUGAR DAILY. DX: E11.9 100 each 12 02/15/2017  . ONETOUCH DELICA LANCETS FINE MISC Check glucose qd 100 each 12 02/15/2017    Inpatient Medications:  . allopurinol  300 mg Oral BID  . aspirin  300 mg Rectal Daily   Or  . aspirin  325 mg Oral Daily  . famotidine  20 mg Oral QHS  . fenofibrate  160 mg Oral Daily  . ferrous sulfate  325 mg Oral BID  . pantoprazole  40 mg Oral Daily  . rosuvastatin  20 mg Oral Daily     Allergies:  Allergies  Allergen Reactions  . Other     SOME BANDAIDS CAUSE SKIN IRRITATION  . Penicillins Itching    Has patient had a PCN reaction causing immediate rash, facial/tongue/throat swelling, SOB or lightheadedness with hypotension: No Has patient had a PCN reaction causing severe rash involving mucus membranes or skin necrosis: No Has patient had a PCN reaction that required hospitalization No Has patient had a PCN reaction occurring within the last 10 years: No If all of the above answers are "NO", then may proceed with Cephalosporin use.     Social History   Social History  . Marital status: Married    Spouse name: N/A  . Number of children: 2  . Years of education: N/A   Occupational History  . housewife Unemployed   Social History Main Topics  . Smoking status: Never Smoker  . Smokeless tobacco: Never Used  . Alcohol use No  . Drug use: No  . Sexual  activity: Yes    Partners: Male   Other Topics Concern  . Not on file   Social History Narrative   No exercise secondary to knee pain     Family History  Problem Relation Age of Onset  . Diabetes Mother   . Hypertension Mother        entire family  . Stroke Father        CVA  . Hyperlipidemia Father        entire family  . Diabetes Father   . Heart disease Father   . Crohn's disease Son   . Irritable bowel syndrome Son       Review of Systems: All other systems reviewed and are otherwise negative except as noted above.  Physical Exam: Vitals:   03/17/17 1530 03/17/17 1656 03/18/17 0200 03/18/17 0600  BP: 127/64 136/76 110/74 117/74  Pulse: 79 84 74 87  Resp: 20 20    Temp: 98.3 F (36.8 C) 98.5 F (36.9 C) 98.1 F (36.7 C) 98.2 F (36.8 C)  TempSrc: Oral Oral Oral Oral  SpO2: 98% 98%  95%  Weight:      Height:        GEN- The patient is elderly and obese appearing, alert and oriented x 3 today.   Head- normocephalic, atraumatic Eyes-  Sclera clear, conjunctiva pink Ears-  hearing intact Oropharynx- clear Neck- supple Lungs- Clear to ausculation bilaterally, normal work of breathing Heart- Regular rate and rhythm  GI- soft, NT, ND, + BS Extremities- no clubbing, cyanosis, or edema MS- no significant deformity or atrophy Skin- no rash or lesion Psych- euthymic mood, full affect   Labs:   Lab Results  Component Value Date   WBC 4.8 03/16/2017   HGB 10.3 (L) 03/16/2017   HCT 32.5 (L) 03/16/2017   MCV 89.0 03/16/2017   PLT 94 (L) 03/16/2017    Recent Labs Lab 03/14/17 0550  03/18/17 0607  NA 141  < > 140  K 4.1  < > 3.8  CL 107  < > 108  CO2 23  < > 24  BUN 29*  < > 17  CREATININE 1.35*  < > 1.23*  CALCIUM 10.2  < > 9.8  PROT 6.4*  --   --   BILITOT 0.4  --   --   ALKPHOS 32*  --   --   ALT 20  --   --   AST 31  --   --   GLUCOSE 131*  < > 116*  < > = values in this interval not displayed. No results found for: CKTOTAL, CKMB, CKMBINDEX, TROPONINI   Radiology/Studies: Ct Head Wo Contrast Result Date: 03/14/2017 CLINICAL DATA:  LEFT arm weakness and numbness 2130 hrs last night, unable to hold anything in LEFT hand, keeps dropping everything in LEFT hand, history hypertension, diabetes mellitus EXAM: CT HEAD WITHOUT CONTRAST TECHNIQUE: Contiguous axial images were obtained from the base of the skull through the vertex without intravenous contrast. Sagittal and coronal MPR images reconstructed from axial data set. COMPARISON:  None FINDINGS: Brain: Normal ventricular morphology. No midline shift or mass effect. Small area of slightly decreased attenuation in the RIGHT parietal lobe consistent with acute infarct. No intracranial hemorrhage or mass lesion. No additional areas of infarction identified. Posterior fossa unremarkable. No extra-axial fluid collections. Vascular: Atherosclerotic calcification of internal carotid arteries bilaterally at skullbase. Skull: Intact Sinuses/Orbits: Clear Other: N/A IMPRESSION: Acute nonhemorrhagic cortical infarct  at high RIGHT parietal lobe. Electronically Signed  By: Lavonia Dana M.D.   On: 03/14/2017 07:02   Mr Brain Wo Contrast Result Date: 03/14/2017 CLINICAL DATA:  67 year old female with acute onset left arm weakness, slurred speech, left facial droop. EXAM: MRI HEAD WITHOUT CONTRAST MRA HEAD WITHOUT CONTRAST TECHNIQUE: Multiplanar, multiecho pulse sequences of the brain and surrounding structures were obtained without intravenous contrast. Angiographic images of the head were obtained using MRA technique without contrast. COMPARISON:  Head CT 0634 hours today. FINDINGS: MRI HEAD FINDINGS Brain: Restricted diffusion along the lateral right motor strip tracking to the right frontal operculum (series 9, image 17). Associated gyral edema. No associated hemorrhage or mass effect. No other restricted diffusion. No midline shift, mass effect, evidence of mass lesion, ventriculomegaly, extra-axial collection or acute intracranial hemorrhage. Cervicomedullary junction and pituitary are within normal limits. There is mild for age scattered nonspecific cerebral white matter T2 and FLAIR hyperintensity. There are several subtle chronic lacunar infarcts in the cerebellum (series 8, image 8). And there is mild nonspecific T2 heterogeneity in both globus pallidus. No cortical encephalomalacia or chronic cerebral blood products identified. Vascular: Major intracranial vascular flow voids are preserved. Skull and upper cervical spine: Negative. Visualized bone marrow signal is within normal limits. Sinuses/Orbits: Normal orbit soft tissues. Visualized paranasal sinuses and mastoids are stable and well pneumatized. Other: Visible internal auditory structures appear normal. Negative scalp soft tissues. MRA HEAD FINDINGS Antegrade flow in the posterior circulation with codominant distal vertebral arteries. No distal vertebral stenosis. Normal basilar artery. AICA, SCA, and PCA origins are normal (the left PCA origin is fetal type). The  right posterior communicating artery is diminutive. Bilateral PCA branches are within normal limits. Antegrade flow in both ICA siphons. No siphon stenosis. Mildly tortuous cavernous ICAs. Ophthalmic and posterior communicating artery origins are normal. Patent carotid termini. Normal MCA and ACA origins. Anterior communicating artery and visible ACA branches are within normal limits. Left MCA branches are within normal limits. Right MCA M1 segment, right MCA bifurcation, and visible right MCA branches are within normal limits. No right MCA branch occlusion identified. IMPRESSION: 1. Acute infarct along the right lateral motor strip corresponding to the noncontrast CT finding earlier today. Gyral cytotoxic edema but no associated hemorrhage or mass effect. 2.  Negative intracranial MRA. 3. Evidence of mild for age underlying small vessel disease. Electronically Signed   By: Genevie Ann M.D.   On: 03/14/2017 21:13    12-lead ECG ST, rate 109 All prior EKG's in EPIC reviewed with no documented atrial fibrillation  Telemetry sinus rhythm, ST  Assessment and Plan:  1. Cryptogenic stroke The patient presents with cryptogenic stroke. TEE demonstrated PFO. LE dopplers negative.  I spoke at length with the patient about monitoring for afib with an implantable loop recorder.  Risks, benefits, and alteratives to implantable loop recorder were discussed with the patient today.   At this time, the patient is very clear in their decision to proceed with implantable loop recorder.   Wound care was reviewed with the patient (keep incision clean and dry for 3 days).   Please call with questions.   Chanetta Marshall, NP 03/18/2017 8:29 AM  I have seen and examined this patient with Chanetta Marshall.  Agree with above, note added to reflect my findings.  On exam, tachycardic, regular, no murmurs, lungs clear. Had CVA with left arm weakness. Symptoms improving. No cause found to date. Plan for LINQ implant. Risks and beneftis  discussed. Risks include but not limited to bleeding and infection. She understands  the risks and has agreed to the procedure.    Furkan Keenum M. Derel Mcglasson MD 03/18/2017 8:42 AM

## 2017-03-18 NOTE — Progress Notes (Signed)
STROKE TEAM PROGRESS NOTE   SUBJECTIVE (INTERVAL HISTORY) Her husband is at the bedside. No neuro change. Loop placed this am. She is pending discharge.    OBJECTIVE Temp:  [98.1 F (36.7 C)-98.8 F (37.1 C)] 98.8 F (37.1 C) (07/05 1009) Pulse Rate:  [74-87] 81 (07/05 1009) Cardiac Rhythm: Normal sinus rhythm;Heart block (07/05 0700) Resp:  [20] 20 (07/04 1656) BP: (110-137)/(60-76) 137/60 (07/05 1009) SpO2:  [94 %-98 %] 94 % (07/05 1009)  CBC:   Recent Labs Lab 03/14/17 0550 03/14/17 0644 03/16/17 0357  WBC 5.3  --  4.8  NEUTROABS 3.5  --   --   HGB 11.2* 11.6* 10.3*  HCT 34.7* 34.0* 32.5*  MCV 88.3  --  89.0  PLT 99*  --  94*    Basic Metabolic Panel:   Recent Labs Lab 03/17/17 0435 03/18/17 0607  NA 138 140  K 3.8 3.8  CL 108 108  CO2 23 24  GLUCOSE 111* 116*  BUN 20 17  CREATININE 1.26* 1.23*  CALCIUM 9.4 9.8    Lipid Panel:     Component Value Date/Time   CHOL 125 03/15/2017 0402   TRIG 284 (H) 03/15/2017 0402   HDL 23 (L) 03/15/2017 0402   CHOLHDL 5.4 03/15/2017 0402   VLDL 57 (H) 03/15/2017 0402   LDLCALC 45 03/15/2017 0402   HgbA1c:  Lab Results  Component Value Date   HGBA1C 5.4 03/15/2017   Urine Drug Screen:     Component Value Date/Time   LABOPIA NONE DETECTED 03/15/2017 1328   COCAINSCRNUR NONE DETECTED 03/15/2017 1328   LABBENZ NONE DETECTED 03/15/2017 1328   AMPHETMU NONE DETECTED 03/15/2017 1328   THCU NONE DETECTED 03/15/2017 1328   LABBARB NONE DETECTED 03/15/2017 1328    Alcohol Level No results found for: Lydia I have personally reviewed the radiological images below and agree with the radiology interpretations.  Ct Head Wo Contrast 03/14/2017 IMPRESSION: Acute nonhemorrhagic cortical infarct at high RIGHT parietal lobe.  Ct Angio Neck W Or Wo Contrast 03/15/2017 IMPRESSION: Negative CTA of the neck. No stenosis, atherosclerosis, or embolic source.  Mr Brain 45 Contrast Mr Jodene Nam Head/brain Wo  Cm 03/14/2017 IMPRESSION: 1. Acute infarct along the right lateral motor strip corresponding to the noncontrast CT finding earlier. Gyral cytotoxic edema but no associated hemorrhage or mass effect. 2.  Negative intracranial MRA. 3. Evidence of mild for age underlying small vessel disease.  TTE - Left ventricle: The cavity size was normal. Wall thickness was normal. Systolic function was normal. The estimated ejection fraction was in the range of 55% to 60%. Doppler parameters are consistent with abnormal left ventricular relaxation (grade 1 diastolic dysfunction).  LE venous Doppler 03/15/2017 No evidence of deep vein or superficial thrombosis involving the right lower extremity and left lower extremity.  TEE 03/16/2017 Left Ventrical:  Normal LV  Mitral Valve: normal  Aortic Valve: normal Tricuspid Valve: normal  Pulmonic Valve: poorly visualized  Left Atrium/ Left atrial appendage: small,  No thrombus  Atrial septum: + PFO by bubble study  Aorta: mild atherosclerosis    PHYSICAL EXAM  Temp:  [98.1 F (36.7 C)-98.8 F (37.1 C)] 98.8 F (37.1 C) (07/05 1009) Pulse Rate:  [74-87] 81 (07/05 1009) Resp:  [20] 20 (07/04 1656) BP: (110-137)/(60-76) 137/60 (07/05 1009) SpO2:  [94 %-98 %] 94 % (07/05 1009)  General - Well nourished, well developed, in no apparent distress.  Ophthalmologic - Sharp disc margins OU.  Cardiovascular - Regular rate and  rhythm.  Mental Status -  Level of arousal and orientation to time, place, and person were intact. Language including expression, naming, repetition, comprehension was assessed and found intact. Fund of Knowledge was assessed and was intact.  Cranial Nerves II - XII - II - Visual field intact OU. III, IV, VI - Extraocular movements intact. V - Facial sensation intact bilaterally. VII - subtle left nasolabial fold flattening. VIII - Hearing & vestibular intact bilaterally. X - Palate elevates symmetrically. XI - Chin turning & shoulder  shrug intact bilaterally. XII - Tongue protrusion intact.  Motor Strength - The patient's strength was normal in all extremities except left hand mild dexterity difficulties, and chronic left foot drop with DF 3-/5 and pronator drift was absent.  Bulk was normal and fasciculations were absent.   Motor Tone - Muscle tone was assessed at the neck and appendages and was normal.  Reflexes - The patient's reflexes were 1+ in all extremities and she had no pathological reflexes.  Sensory - Light touch, temperature/pinprick were assessed and were symmetrical.    Coordination - The patient had normal movements in the hands with no ataxia or dysmetria.  Tremor was absent.  Gait and Station - walks with walker, slow, but steady gait.   ASSESSMENT/PLAN Ms. SOLSTICE LASTINGER is a 66 y.o. female with history of rheumatic fever, hypertension (inconsistently takes her blood pressure medicine), diabetes mellitus and hyperlipidemia presenting with left arm weakness and slurred speech. She did not receive IV t-PA due to arriving outside of the treatment window.   Stroke: Acute right MCA cortical infarct likely embolic, etiology unclear.   Resultant improved left hand dexterity difficulty  CT head: Acute cortical infarct at high RIGHT parietal lobe.  MRI head:  Acute infarct along the right lateral motor strip    MRA head Negative intracranial MRA.  CTA neck unremarkable  2D Echo: EF 55-60%  TEE positive PFO but otherwise normal  Loop recorder placed.   LE venous Doppler: no DVT  LDL: 45  HgbA1c: 5.4  SCDs for VTE prophylaxis Diet Carb Modified Fluid consistency: Thin; Room service appropriate? Yes Diet - low sodium heart healthy Diet - low sodium heart healthy Diet - low sodium heart healthy  No antithrombotic prior to admission, now on aspirin 325 mg daily. Continue aspirin at discharge.  Patient counseled to be compliant with her antithrombotic medications  Ongoing aggressive stroke  risk factor management  Therapy recommendations: Home health OT;Supervision - Intermittent   Disposition:  home  History of rheumatic fever in childhood  No significant cardiac issue since then  TTE unremarkable  TEE unremarkable vulves but positive PFO noted  Hypertension  Stable  Permissive hypertension (OK if < 220/120) but gradually normalize in 5-7 days  Long-term BP goal normotensive  Diabetes  HgbA1c 5.4, goal < 7.0  Controlled  CBG monitoring  PCP follow up as outpt  Hyperlipidemia   Home medication - Crestor and fenofibrate  LDL 45, goal < 70  Continue Crestor  Continue fenofibrate  Other Stroke Risk Factors  Morbid obesity, Body mass index is 43.42 kg/m., recommend weight loss, diet and exercise as appropriate   Family hx stroke (father)  Other Active Problems  S/p bilateral knee surgery  Chronic left foot drop after left knee surgery  Hospital day # 3  Neurology will sign off. Please call with questions. Pt will follow up with Dr. Erlinda Hong at [facility] in about 6 weeks. Thanks for the consult.   Rosalin Hawking, MD PhD  Stroke Neurology 03/18/2017 10:56 AM    To contact Stroke Continuity provider, please refer to [email or web address] After hours, contact General Neurology

## 2017-03-18 NOTE — Discharge Summary (Signed)
Physician Discharge Summary  Rachel Black MRN: [id number] DOB/AGE: 1950/11/11 66 y.o.  PCP: Ann Held, DO   Admit date: 03/14/2017 Discharge date: 03/18/2017  Discharge Diagnoses:    Active Problems:   DM2 (diabetes mellitus, type 2) (HCC)   Hyperlipidemia   Severe obesity (BMI >= 40) (HCC)   Essential hypertension   Morbid obesity (HCC)   AKI (acute kidney injury) ([place])   Acute CVA (cerebrovascular accident) ([place])   PFO (patent foramen ovale)     Follow-up recommendations Follow-up with PCP in 3-5 days , including all  additional recommended appointments as below Follow-up CBC, CMP in 3-5 days Patient  declined home health physical therapy    Current Discharge Medication List    START taking these medications   Details  aspirin 325 MG tablet Take 1 tablet (325 mg total) by mouth daily. Qty: 30 tablet, Refills: 3    senna-docusate (SENOKOT-S) 8.6-50 MG tablet Take 1 tablet by mouth at bedtime as needed for mild constipation. Qty: 30 tablet, Refills: 1      CONTINUE these medications which have CHANGED   Details  valsartan-hydrochlorothiazide (DIOVAN-HCT) 80-12.5 MG tablet Take 1 tablet by mouth daily. Qty: 90 tablet, Refills: 0   Associated Diagnoses: Essential hypertension      CONTINUE these medications which have NOT CHANGED   Details  allopurinol (ZYLOPRIM) 300 MG tablet TAKE 1 TABLET BY MOUTH 2 TIMES DAILY Qty: 180 tablet, Refills: 0   Associated Diagnoses: Idiopathic gout, unspecified chronicity, unspecified site    calcium carbonate (TUMS - DOSED IN MG ELEMENTAL CALCIUM) 500 MG chewable tablet Chew 2 tablets by mouth 3 (three) times daily as needed for indigestion or heartburn.    calcium-vitamin D (OSCAL WITH D) 500-200 MG-UNIT per tablet Take 1 tablet by mouth daily with breakfast.     DEXILANT 60 MG capsule TAKE 1 CAPSULE (60 MG TOTAL) BY MOUTH EVERY MORNING Qty: 90 capsule, Refills: 3    fenofibrate 160 MG tablet TAKE 1 TABLET (160 MG  TOTAL) BY MOUTH DAILY. Qty: 90 tablet, Refills: 1    ferrous sulfate 325 (65 FE) MG EC tablet Take 1 tablet (325 mg total) by mouth 2 (two) times daily. Qty: 60 tablet, Refills: 11   Associated Diagnoses: Gastric polyp    JANUMET XR 502 330 3648 MG TB24 TAKE 1 TABLET BY MOUTH EVERY DAY Qty: 90 tablet, Refills: 1   Associated Diagnoses: DM (diabetes mellitus) type II uncontrolled, periph vascular disorder (HCC)    potassium chloride SA (K-DUR,KLOR-CON) 20 MEQ tablet Take 1 tablet (20 mEq total) by mouth daily. Qty: 90 tablet, Refills: 1    ranitidine (ZANTAC) 150 MG tablet TAKE 1 TABLET (150 MG TOTAL) BY MOUTH AT BEDTIME. Qty: 30 tablet, Refills: 3    rosuvastatin (CRESTOR) 20 MG tablet TAKE 1 TABLET (20 MG TOTAL) BY MOUTH DAILY. Qty: 90 tablet, Refills: 1   Associated Diagnoses: Hyperlipidemia    ONE TOUCH ULTRA TEST test strip ONETOUCH ULTRA BLUE TEST STRIPS CHECK BLOOD SUGAR DAILY. DX: E11.9 Qty: 100 each, Refills: 12   Associated Diagnoses: Controlled type 2 diabetes mellitus without complication, without long-term current use of insulin (HCC)    ONETOUCH DELICA LANCETS FINE MISC Check glucose qd Qty: 100 each, Refills: 12   Associated Diagnoses: Controlled type 2 diabetes mellitus without complication, without long-term current use of insulin [facility])          Discharge Condition:Stable    Discharge Instructions Get Medicines reviewed and adjusted: Please take all your  medications with you for your next visit with your Primary MD  Please request your Primary MD to go over all hospital tests and procedure/radiological results at the follow up, please ask your Primary MD to get all Hospital records sent to his/her office.  If you experience worsening of your admission symptoms, develop shortness of breath, life threatening emergency, suicidal or homicidal thoughts you must seek medical attention immediately by calling 911 or calling your MD immediately if symptoms less  severe.  You must read complete instructions/literature along with all the possible adverse reactions/side effects for all the Medicines you take and that have been prescribed to you. Take any new Medicines after you have completely understood and accpet all the possible adverse reactions/side effects.   Do not drive when taking Pain medications.   Do not take more than prescribed Pain, Sleep and Anxiety Medications  Special Instructions: If you have smoked or chewed Tobacco in the last 2 yrs please stop smoking, stop any regular Alcohol and or any Recreational drug use.  Wear Seat belts while driving.  Please note  You were cared for by a hospitalist during your hospital stay. Once you are discharged, your primary care physician will handle any further medical issues. Please note that NO REFILLS for any discharge medications will be authorized once you are discharged, as it is imperative that you return to your primary care physician (or establish a relationship with a primary care physician if you do not have one) for your aftercare needs so that they can reassess your need for medications and monitor your lab values.  Discharge Instructions    Ambulatory referral to Cardiology    Complete by:  As directed    ILR  Needed urgently as per neurology   Diet - low sodium heart healthy    Complete by:  As directed    Diet - low sodium heart healthy    Complete by:  As directed    Increase activity slowly    Complete by:  As directed    Increase activity slowly    Complete by:  As directed        Allergies  Allergen Reactions  . Other     SOME BANDAIDS CAUSE SKIN IRRITATION  . Penicillins Itching    Has patient had a PCN reaction causing immediate rash, facial/tongue/throat swelling, SOB or lightheadedness with hypotension: No Has patient had a PCN reaction causing severe rash involving mucus membranes or skin necrosis: No Has patient had a PCN reaction that required  hospitalization No Has patient had a PCN reaction occurring within the last 10 years: No If all of the above answers are "NO", then may proceed with Cephalosporin use.       Disposition: 01-Home or Self Care   Consults:  Neurology    Significant Diagnostic Studies:  Ct Head Wo Contrast  Result Date: 03/14/2017 CLINICAL DATA:  LEFT arm weakness and numbness 2130 hrs last night, unable to hold anything in LEFT hand, keeps dropping everything in LEFT hand, history hypertension, diabetes mellitus EXAM: CT HEAD WITHOUT CONTRAST TECHNIQUE: Contiguous axial images were obtained from the base of the skull through the vertex without intravenous contrast. Sagittal and coronal MPR images reconstructed from axial data set. COMPARISON:  None FINDINGS: Brain: Normal ventricular morphology. No midline shift or mass effect. Small area of slightly decreased attenuation in the RIGHT parietal lobe consistent with acute infarct. No intracranial hemorrhage or mass lesion. No additional areas of infarction identified. Posterior fossa unremarkable.  No extra-axial fluid collections. Vascular: Atherosclerotic calcification of internal carotid arteries bilaterally at skullbase. Skull: Intact Sinuses/Orbits: Clear Other: N/A IMPRESSION: Acute nonhemorrhagic cortical infarct at high RIGHT parietal lobe. Electronically Signed   By: Lavonia Dana M.D.   On: 03/14/2017 07:02   Ct Angio Neck W Or Wo Contrast  Result Date: 03/15/2017 CLINICAL DATA:  Stroke with left arm numbness. EXAM: CT ANGIOGRAPHY NECK TECHNIQUE: Multidetector CT imaging of the neck was performed using the standard protocol during bolus administration of intravenous contrast. Multiplanar CT image reconstructions and MIPs were obtained to evaluate the vascular anatomy. Carotid stenosis measurements (when applicable) are obtained utilizing NASCET criteria, using the distal internal carotid diameter as the denominator. CONTRAST:  50 cc Isovue 370 intravenous  COMPARISON:  None. FINDINGS: Aortic arch: Normal appearance of the arch.  Three vessel branching. Right carotid system: Smooth and widely patent. Common carotid tortuosity with partial retropharyngeal course. Negative for beading. Left carotid system: Smooth and widely patent. Common carotid tortuosity with partial retropharyngeal course. Negative for beading. Vertebral arteries: No proximal subclavian stenosis. Both vertebral arteries are smooth and widely patent to the dura. Skeleton: No acute or aggressive finding. Facet arthropathy with slight anterolisthesis at C3-4. Other neck: No incidental mass or inflammation. Upper chest: No acute finding. IMPRESSION: Negative CTA of the neck. No stenosis, atherosclerosis, or embolic source. Electronically Signed   By: Monte Fantasia M.D.   On: 03/15/2017 11:34   Mr Brain Wo Contrast  Result Date: 03/14/2017 CLINICAL DATA:  66 year old female with acute onset left arm weakness, slurred speech, left facial droop. EXAM: MRI HEAD WITHOUT CONTRAST MRA HEAD WITHOUT CONTRAST TECHNIQUE: Multiplanar, multiecho pulse sequences of the brain and surrounding structures were obtained without intravenous contrast. Angiographic images of the head were obtained using MRA technique without contrast. COMPARISON:  Head CT 0634 hours today. FINDINGS: MRI HEAD FINDINGS Brain: Restricted diffusion along the lateral right motor strip tracking to the right frontal operculum (series 9, image 17). Associated gyral edema. No associated hemorrhage or mass effect. No other restricted diffusion. No midline shift, mass effect, evidence of mass lesion, ventriculomegaly, extra-axial collection or acute intracranial hemorrhage. Cervicomedullary junction and pituitary are within normal limits. There is mild for age scattered nonspecific cerebral white matter T2 and FLAIR hyperintensity. There are several subtle chronic lacunar infarcts in the cerebellum (series 8, image 8). And there is mild nonspecific  T2 heterogeneity in both globus pallidus. No cortical encephalomalacia or chronic cerebral blood products identified. Vascular: Major intracranial vascular flow voids are preserved. Skull and upper cervical spine: Negative. Visualized bone marrow signal is within normal limits. Sinuses/Orbits: Normal orbit soft tissues. Visualized paranasal sinuses and mastoids are stable and well pneumatized. Other: Visible internal auditory structures appear normal. Negative scalp soft tissues. MRA HEAD FINDINGS Antegrade flow in the posterior circulation with codominant distal vertebral arteries. No distal vertebral stenosis. Normal basilar artery. AICA, SCA, and PCA origins are normal (the left PCA origin is fetal type). The right posterior communicating artery is diminutive. Bilateral PCA branches are within normal limits. Antegrade flow in both ICA siphons. No siphon stenosis. Mildly tortuous cavernous ICAs. Ophthalmic and posterior communicating artery origins are normal. Patent carotid termini. Normal MCA and ACA origins. Anterior communicating artery and visible ACA branches are within normal limits. Left MCA branches are within normal limits. Right MCA M1 segment, right MCA bifurcation, and visible right MCA branches are within normal limits. No right MCA branch occlusion identified. IMPRESSION: 1. Acute infarct along the right lateral motor strip  corresponding to the noncontrast CT finding earlier today. Gyral cytotoxic edema but no associated hemorrhage or mass effect. 2.  Negative intracranial MRA. 3. Evidence of mild for age underlying small vessel disease. Electronically Signed   By: Genevie Ann M.D.   On: 03/14/2017 21:13   Mr Jodene Nam Head/brain OF Cm  Result Date: 03/14/2017 CLINICAL DATA:  66 year old female with acute onset left arm weakness, slurred speech, left facial droop. EXAM: MRI HEAD WITHOUT CONTRAST MRA HEAD WITHOUT CONTRAST TECHNIQUE: Multiplanar, multiecho pulse sequences of the brain and surrounding  structures were obtained without intravenous contrast. Angiographic images of the head were obtained using MRA technique without contrast. COMPARISON:  Head CT 0634 hours today. FINDINGS: MRI HEAD FINDINGS Brain: Restricted diffusion along the lateral right motor strip tracking to the right frontal operculum (series 9, image 17). Associated gyral edema. No associated hemorrhage or mass effect. No other restricted diffusion. No midline shift, mass effect, evidence of mass lesion, ventriculomegaly, extra-axial collection or acute intracranial hemorrhage. Cervicomedullary junction and pituitary are within normal limits. There is mild for age scattered nonspecific cerebral white matter T2 and FLAIR hyperintensity. There are several subtle chronic lacunar infarcts in the cerebellum (series 8, image 8). And there is mild nonspecific T2 heterogeneity in both globus pallidus. No cortical encephalomalacia or chronic cerebral blood products identified. Vascular: Major intracranial vascular flow voids are preserved. Skull and upper cervical spine: Negative. Visualized bone marrow signal is within normal limits. Sinuses/Orbits: Normal orbit soft tissues. Visualized paranasal sinuses and mastoids are stable and well pneumatized. Other: Visible internal auditory structures appear normal. Negative scalp soft tissues. MRA HEAD FINDINGS Antegrade flow in the posterior circulation with codominant distal vertebral arteries. No distal vertebral stenosis. Normal basilar artery. AICA, SCA, and PCA origins are normal (the left PCA origin is fetal type). The right posterior communicating artery is diminutive. Bilateral PCA branches are within normal limits. Antegrade flow in both ICA siphons. No siphon stenosis. Mildly tortuous cavernous ICAs. Ophthalmic and posterior communicating artery origins are normal. Patent carotid termini. Normal MCA and ACA origins. Anterior communicating artery and visible ACA branches are within normal limits.  Left MCA branches are within normal limits. Right MCA M1 segment, right MCA bifurcation, and visible right MCA branches are within normal limits. No right MCA branch occlusion identified. IMPRESSION: 1. Acute infarct along the right lateral motor strip corresponding to the noncontrast CT finding earlier today. Gyral cytotoxic edema but no associated hemorrhage or mass effect. 2.  Negative intracranial MRA. 3. Evidence of mild for age underlying small vessel disease. Electronically Signed   By: Genevie Ann M.D.   On: 03/14/2017 21:13    echocardiogram   LV EF: 55% -   60%  ------------------------------------------------------------------- Indications:      CVA 436.  ------------------------------------------------------------------- History:   Risk factors:  Hypertension. Diabetes mellitus. Dyslipidemia.  ------------------------------------------------------------------- Study Conclusions  - Left ventricle: The cavity size was normal. Wall thickness was   normal. Systolic function was normal. The estimated ejection   fraction was in the range of 55% to 60%. Doppler parameters are   consistent with abnormal left ventricular relaxation (grade 1   diastolic dysfunction).   TEE  Left Ventrical:  Normal LV   Mitral Valve: normal   Aortic Valve: normal  Tricuspid Valve: normal   Pulmonic Valve: poorly visualized   Left Atrium/ Left atrial appendage: small,  No thrombus   Atrial septum: + PFO by bubble study   Aorta: mild atherosclerosis     [employer]  03/14/17 0546  Weight: 122 kg (269 lb)     Microbiology: No results found for this or any previous visit (from the past 240 hour(s)).     Blood Culture    Component Value Date/Time   SDES URINE, RANDOM 12/07/2016 0038   SPECREQUEST NONE 12/07/2016 0038   CULT MULTIPLE SPECIES PRESENT, SUGGEST RECOLLECTION (A) 12/07/2016 0038   REPTSTATUS 12/08/2016 FINAL 12/07/2016 0038      Labs: Results for  orders placed or performed during the hospital encounter of 03/14/17 (from the past 48 hour(s))  Glucose, capillary     Status: Abnormal   Collection Time: 03/16/17 11:13 AM  Result Value Ref Range   Glucose-Capillary 136 (H) 65 - 99 mg/dL   Comment 1 Notify RN    Comment 2 Document in Chart   Glucose, capillary     Status: Abnormal   Collection Time: 03/16/17  4:59 PM  Result Value Ref Range   Glucose-Capillary 102 (H) 65 - 99 mg/dL   Comment 1 Notify RN    Comment 2 Document in Chart   Glucose, capillary     Status: Abnormal   Collection Time: 03/16/17  9:25 PM  Result Value Ref Range   Glucose-Capillary 125 (H) 65 - 99 mg/dL   Comment 1 Notify RN    Comment 2 Document in Chart   Basic metabolic panel     Status: Abnormal   Collection Time: 03/17/17  4:35 AM  Result Value Ref Range   Sodium 138 135 - 145 mmol/L   Potassium 3.8 3.5 - 5.1 mmol/L   Chloride 108 101 - 111 mmol/L   CO2 23 22 - 32 mmol/L   Glucose, Bld 111 (H) 65 - 99 mg/dL   BUN 20 6 - 20 mg/dL   Creatinine, Ser 1.26 (H) 0.44 - 1.00 mg/dL   Calcium 9.4 8.9 - 10.3 mg/dL   GFR calc non Af Amer 44 (L) >60 mL/min   GFR calc Af Amer 51 (L) >60 mL/min    Comment: (NOTE) The eGFR has been calculated using the CKD EPI equation. This calculation has not been validated in all clinical situations. eGFR's persistently <60 mL/min signify possible Chronic Kidney Disease.    Anion gap 7 5 - 15  Glucose, capillary     Status: Abnormal   Collection Time: 03/17/17  6:11 AM  Result Value Ref Range   Glucose-Capillary 114 (H) 65 - 99 mg/dL   Comment 1 Notify RN    Comment 2 Document in Chart   Glucose, capillary     Status: Abnormal   Collection Time: 03/17/17 12:09 PM  Result Value Ref Range   Glucose-Capillary 119 (H) 65 - 99 mg/dL   Comment 1 Notify RN    Comment 2 Document in Chart   Glucose, capillary     Status: Abnormal   Collection Time: 03/17/17  4:31 PM  Result Value Ref Range   Glucose-Capillary 145 (H) 65 -  99 mg/dL   Comment 1 Notify RN    Comment 2 Document in Chart   Glucose, capillary     Status: Abnormal   Collection Time: 03/17/17  9:16 PM  Result Value Ref Range   Glucose-Capillary 125 (H) 65 - 99 mg/dL   Comment 1 Notify RN    Comment 2 Document in Chart   Basic metabolic panel     Status: Abnormal   Collection Time: 03/18/17  6:07 AM  Result Value Ref Range   Sodium 140 135 - 145  mmol/L   Potassium 3.8 3.5 - 5.1 mmol/L   Chloride 108 101 - 111 mmol/L   CO2 24 22 - 32 mmol/L   Glucose, Bld 116 (H) 65 - 99 mg/dL   BUN 17 6 - 20 mg/dL   Creatinine, Ser 1.23 (H) 0.44 - 1.00 mg/dL   Calcium 9.8 8.9 - 10.3 mg/dL   GFR calc non Af Amer 45 (L) >60 mL/min   GFR calc Af Amer 53 (L) >60 mL/min    Comment: (NOTE) The eGFR has been calculated using the CKD EPI equation. This calculation has not been validated in all clinical situations. eGFR's persistently <60 mL/min signify possible Chronic Kidney Disease.    Anion gap 8 5 - 15  Glucose, capillary     Status: Abnormal   Collection Time: 03/18/17  6:10 AM  Result Value Ref Range   Glucose-Capillary 111 (H) 65 - 99 mg/dL   Comment 1 Notify RN    Comment 2 Document in Chart      Lipid Panel     Component Value Date/Time   CHOL 125 03/15/2017 0402   TRIG 284 (H) 03/15/2017 0402   HDL 23 (L) 03/15/2017 0402   CHOLHDL 5.4 03/15/2017 0402   VLDL 57 (H) 03/15/2017 0402   LDLCALC 45 03/15/2017 0402   LDLDIRECT 37.0 12/18/2016 1240     Lab Results  Component Value Date   HGBA1C 5.4 03/15/2017   HGBA1C 5.4 12/18/2016   HGBA1C 5.7 02/13/2016      HPI   66 y.o.femalewith a history of rheumatic fever, hypertension (inconsistently takes her blood pressure medicine), diabetes mellitus and hyperlipidemia who presented with left arm weakness and slurred speech on 03/14/2017.  The patient noted that she kept dropping objects with her left hand, and her husband noted that she was slurring her words.  They waited to see if her  symptoms would improve, and drove to the ED on the morning of 03/14/2017 when they did not.  Of note, the patient has as left foot drop at baseline as a complication of her bilateral knee arthoplasty, and was using a walker at home prior to admission.  That patient underwent laparoscopic cholecystectomy in March 2018.  CT head with acute high right cortical parietal infarct.  MR with right lateral motor strip infarct with cytotoxic edema.  Negative MRA and CTA head.   Patient was not administered IV t-PA secondary to arriving outside of the treatment window. She was admitted to General Neurology for further evaluation and treatment.  HOSPITAL COURSE:   Stroke: Acute right MCA cortical infarct likely embolic, etiology unclear. Resultant  left hand dexterity difficulty.CT head: Acute cortical infarct at high RIGHT parietal lobe.MRI head:  Acute infarct along the right lateral motor strip  ,MRA head Negative intracranial MRA. CTA neck unremarkable.2D Echo: EF 55-60%.TEE positive PFO but otherwise normal. Neurology Recommended  loop recorder to rule out afib , this was completed prior to discharge . LE venous Doppler: no DVT. LDL: 45,HgbA1c: 5.4.  No antithrombotic prior to admission, now on aspirin 325 mg daily. Continue aspirin 325  Mg  at discharge. Therapy recommendations: Home health OT /PT, patient refused home health services   DM2 (diabetes mellitus, type 2) ([place]): Last hemoglobin A1c was 5.4. Blood glucose 126 fasting. Continue home oral hypoglycemic regimen  Hyperlipidemia LDL goal <70 Continue  Crestor. LDL 45  Severe obesity (BMI >= 40) (HCC)  Essential hypertension Allow permissive hypertension. Hold antihypertensive medication.  AKI (acute kidney injury) ([place]) With  baseline creatinine around 1. Slightly above baseline at 1.26 Diovan HCTZ was held, may be resumed if renal function is stable on follow-up   Hypokalemia: Repleted orally.   Discharge Exam: *  Blood pressure  117/74, pulse 87, temperature 98.2 F (36.8 C), temperature source Oral, resp. rate 20, height 5' 6"  (1.676 m), weight 122 kg (269 lb), SpO2 95 %.  General exam: In no acute distress Respiratory system: Good air movement and clear to auscultation. Cardiovascular system: Irregular rate and rhythm with positive S1-S2 no JVD Gastrointestinal system: Other bowel sounds soft nontender nondistended. Central nervous system: Awake alert and oriented 3, 3-12 are grossly intact sensation is intact throughout muscle strength is 4 out of 5 on deltoid and bicep on the left upper extremity. Deep tendon reflexes are 2+ bilateral and symmetrical. No ataxia. Extremities: No lower extremity edema.    Follow-up Information    Ann Held, DO. Call.   Specialty:  Family Medicine Why:  hospital follow-up in 3-5 days Contact information: [address] [place] [address] [phone]        Rosalin Hawking, MD. Call.   Specialty:  Neurology Why:  To make appointment, follow-up in 4-6 weeks Contact information: [address] [place] [phone]        [place] Office Follow up on 03/31/2017.   Specialty:  Cardiology Why:  at 10:30AM for wound check  Contact information: [address] [place]          Signed: Reyne Dumas 03/18/2017, 10:01 AM        Time spent >1 hour

## 2017-03-18 NOTE — Progress Notes (Signed)
Pt d/c home. Alert and oriented, no new concerns, d/c instructions done with teach back pt verbalize understanding. Pt is transported out of building by spouse.

## 2017-03-22 ENCOUNTER — Telehealth: Payer: Self-pay | Admitting: Cardiology

## 2017-03-22 ENCOUNTER — Other Ambulatory Visit: Payer: Self-pay | Admitting: *Deleted

## 2017-03-22 NOTE — Patient Outreach (Signed)
[facility]) Care Management  03/22/2017  Rachel Black [birth year] [id number]  EMMI-Stroke referral for red dashboard for -"filled new prescriptions-no":  Telephone call to patient who was advised of reason for call   HIPPA verification received from patient.   Patient voices that when she received call she had not picked up prescription. States husband has picked up prescription since call & she it taking all of medications as prescribed by her doctors.   States she will have follow up appointment 7/12 with primary care provider. States spouse will take her to appointment.   Voices that she has some weakness in left arm & hand and problem with gripping but continues to do the exercises that she was taught while she was hospitalized. States speech slurring & facial droop was resolved while she was hospitalized. Voices that she has been reading stroke book & is able to recognize other stroke symptoms & will call 911 if they occur.   States also has appointments with neurologist & cardiologist and plans to attend.  States no concerns at this time. EMMI dashboard has been addressed.  Plan: Close case/send to care management assistant.  Sherrin Daisy, RN BSN [place] Management Coordinator [facility] Care Management  [phone]

## 2017-03-22 NOTE — Telephone Encounter (Signed)
This past Thursday pt had ILR implanted and states that this morning the gauze and clear bandage that was over it, fell off.  She was concerned and checking to make sure it did not need to be recovered. Informed patient that she did not need anything over the site post 24 hrs (aside from steri strips), and she reports no steri strips over the site.  Advised she is ok to leave it open to air. Patient verbalized understanding and agreeable to plan.

## 2017-03-22 NOTE — Telephone Encounter (Signed)
New Message  Pt call requesting to speak with RN. Pt states she was changing her badges and everything came off. Pt states the hospital gave her new badges. Pt would like to speak with Rn to make sure she is doing thins correctly. Please call back to discuss

## 2017-03-25 ENCOUNTER — Ambulatory Visit (INDEPENDENT_AMBULATORY_CARE_PROVIDER_SITE_OTHER): Payer: BLUE CROSS/BLUE SHIELD | Admitting: Family Medicine

## 2017-03-25 ENCOUNTER — Encounter: Payer: Self-pay | Admitting: Family Medicine

## 2017-03-25 ENCOUNTER — Other Ambulatory Visit: Payer: Self-pay | Admitting: Family Medicine

## 2017-03-25 VITALS — BP 108/70 | HR 84 | Temp 98.1°F | Resp 16 | Ht 66.0 in | Wt 259.0 lb

## 2017-03-25 DIAGNOSIS — I10 Essential (primary) hypertension: Secondary | ICD-10-CM

## 2017-03-25 DIAGNOSIS — I639 Cerebral infarction, unspecified: Secondary | ICD-10-CM

## 2017-03-25 DIAGNOSIS — Z794 Long term (current) use of insulin: Secondary | ICD-10-CM

## 2017-03-25 DIAGNOSIS — E1159 Type 2 diabetes mellitus with other circulatory complications: Secondary | ICD-10-CM | POA: Diagnosis not present

## 2017-03-25 DIAGNOSIS — N289 Disorder of kidney and ureter, unspecified: Secondary | ICD-10-CM

## 2017-03-25 DIAGNOSIS — E785 Hyperlipidemia, unspecified: Secondary | ICD-10-CM

## 2017-03-25 LAB — CBC WITH DIFFERENTIAL/PLATELET
BASOS ABS: 0 10*3/uL (ref 0.0–0.1)
BASOS PCT: 0.4 % (ref 0.0–3.0)
Eosinophils Absolute: 0 10*3/uL (ref 0.0–0.7)
Eosinophils Relative: 0 % (ref 0.0–5.0)
HEMATOCRIT: 34.5 % — AB (ref 36.0–46.0)
Hemoglobin: 11.4 g/dL — ABNORMAL LOW (ref 12.0–15.0)
LYMPHS PCT: 27.7 % (ref 12.0–46.0)
Lymphs Abs: 1.7 10*3/uL (ref 0.7–4.0)
MCHC: 33.1 g/dL (ref 30.0–36.0)
MCV: 88.6 fl (ref 78.0–100.0)
MONOS PCT: 4.3 % (ref 3.0–12.0)
Monocytes Absolute: 0.3 10*3/uL (ref 0.1–1.0)
NEUTROS ABS: 4.1 10*3/uL (ref 1.4–7.7)
Neutrophils Relative %: 67.6 % (ref 43.0–77.0)
PLATELETS: 104 10*3/uL — AB (ref 150.0–400.0)
RBC: 3.89 Mil/uL (ref 3.87–5.11)
RDW: 18.2 % — ABNORMAL HIGH (ref 11.5–15.5)
WBC: 6.1 10*3/uL (ref 4.0–10.5)

## 2017-03-25 LAB — COMPREHENSIVE METABOLIC PANEL
ALK PHOS: 34 U/L — AB (ref 39–117)
ALT: 16 U/L (ref 0–35)
AST: 21 U/L (ref 0–37)
Albumin: 4.1 g/dL (ref 3.5–5.2)
BILIRUBIN TOTAL: 0.4 mg/dL (ref 0.2–1.2)
BUN: 24 mg/dL — ABNORMAL HIGH (ref 6–23)
CALCIUM: 9.9 mg/dL (ref 8.4–10.5)
CO2: 21 meq/L (ref 19–32)
Chloride: 109 mEq/L (ref 96–112)
Creatinine, Ser: 1.28 mg/dL — ABNORMAL HIGH (ref 0.40–1.20)
GFR: 44.51 mL/min — AB (ref >60.00)
Glucose, Bld: 118 mg/dL — ABNORMAL HIGH (ref 70–99)
Potassium: 4.2 mEq/L (ref 3.5–5.1)
Sodium: 139 mEq/L (ref 135–145)
Total Protein: 6.2 g/dL (ref 6.0–8.3)

## 2017-03-25 MED ORDER — POTASSIUM CHLORIDE CRYS ER 20 MEQ PO TBCR: 20.0000 meq | EXTENDED_RELEASE_TABLET | Freq: Every day | ORAL | 1 refills | Status: DC

## 2017-03-25 NOTE — Patient Instructions (Signed)
Ischemic Stroke An ischemic stroke (cerebrovascular accident, or CVA) is the sudden death of brain tissue that occurs when an area of the brain does not get enough oxygen. It is a medical emergency that must be treated right away. An ischemic stroke can cause permanent loss of brain function. This can cause problems with how different parts of your body function. What are the causes? This condition is caused by a decrease of oxygen supply to an area of the brain, which may be the result of:  A small blood clot (embolus) or a buildup of plaque in the blood vessels (atherosclerosis) that blocks blood flow in the brain.  An abnormal heart rhythm (atrial fibrillation).  A blocked or damaged artery in the head or neck.  What increases the risk? Certain factors may make you more likely to develop this condition. Some of these factors are things that you can change, such as:  Obesity.  Smoking cigarettes.  Taking oral birth control, especially if you also use tobacco.  Physical inactivity.  Excessive alcohol use.  Use of illegal drugs, especially cocaine and methamphetamine.  Other risk factors include:  High blood pressure (hypertension).  High cholesterol.  Diabetes mellitus.  Heart disease.  Being [place] American, Native American, Hispanic, or [place] Native.  Being over age 40.  Family history of stroke.  Previous history of blood clots, stroke, or transient ischemic attack (TIA).  Sickle cell disease.  Being a woman with a history of preeclampsia.  Migraine headache.  Sleep apnea.  Irregular heartbeats, such as atrial fibrillation.  Chronic inflammatory diseases, such as rheumatoid arthritis or lupus.  Blood clotting disorders (hypercoagulable state).  What are the signs or symptoms? Symptoms of this condition usually develop suddenly, or you may notice them after waking up from sleep. Symptoms may include sudden:  Weakness or numbness in your face, arm, or  leg, especially on one side of your body.  Trouble walking or difficulty moving your arms or legs.  Loss of balance or coordination.  Confusion.  Slurred speech (dysarthria).  Trouble speaking, understanding speech, or both (aphasia).  Vision changes-such as double vision, blurred vision, or loss of vision-inone or both eyes.  Dizziness.  Nausea and vomiting.  Severe headache with no known cause. The headache is often described as the worst headache ever experienced.  If possible, make note of the exact time that you last felt like your normal self and what time your symptoms started. Tell your health care provider. If symptoms come and go, this could be a sign of a warning stroke, or TIA. Get help right away, even if you feel better. How is this diagnosed? This condition may be diagnosed based on:  Your symptoms, your medical history, and a physical exam.  CT scan of the brain.  MRI.  CT angiogram. This test uses a computer to take X-rays of your arteries. A dye may be injected into your blood to show the inside of your blood vessels more clearly.  MRI angiogram. This is a type of MRI that is used to evaluate the blood vessels.  Cerebral angiogram. This test uses X-rays and a dye to show the blood vessels in the brain and neck.  You may need to see a health care provider who specializes in stroke care. A stroke specialist can be seen in person or through communication using telephone or television technology (telemedicine). Other tests may also be done to find the cause of the stroke, such as:  Electrocardiogram (ECG).  Continuous  heart monitoring.  Echocardiogram.  Carotid ultrasound.  A scan of the brain circulation.  Blood tests.  Sleep study to check for sleep apnea.  How is this treated? Treatment for this condition will depend on the duration, severity, and cause of your symptoms and on the area of the brain affected. It is very important to get  treatment at the first sign of stroke symptoms. Some treatments work better if they are done within 3-6 hours of the onset of stroke symptoms. These initial treatments may include:  Aspirin.  Medicines to control blood pressure.  Medicine given by injection to dissolve the blood clot (thrombolytic).  Treatments given directly to the affected artery to remove or dissolve the blood clot.  Other treatment options may include:  Oxygen.  IV fluids.  Medicines to thin the blood (anticoagulants or antiplatelets).  Procedures to increase blood flow.  Medicines and changes to your diet may be used to help treat and manage risk factors for stroke, such as diabetes, high cholesterol, and high blood pressure. After a stroke, you may work with physical, speech, mental health, or occupational therapists to help you recover. Follow these instructions at home: Medicines  Take over-the-counter and prescription medicines only as told by your health care provider.  If you were told to take a medicine to thin your blood, such as aspirin or an anticoagulant, take it exactly as told by your health care provider. ? Taking too much blood-thinning medicine can cause bleeding. ? If you do not take enough blood-thinning medicine, you will not have the protection that you need against another stroke and other problems.  Understand the side effects of taking anticoagulant medicine. When taking this type of medicine, make sure you: ? Hold pressure over any cuts for longer than usual. ? Tell your dentist and other health care providers that you are taking anticoagulants before you have any procedures that may cause bleeding. ? Avoid activities that may cause trauma or injury. Eating and drinking  Follow instructions from your health care provider about diet.  Eat healthy foods.  If your ability to swallow was affected by the stroke, you may need to take steps to avoid choking, such as: ? Taking small  bites when eating. ? Eating foods that are soft or pureed. Safety  Follow instructions from your health care team about physical activity.  Use a walker or cane as told by your health care provider.  Take steps to create a safe home environment in order to reduce the risk of falls. This may include: ? Having your home looked at by specialists. ? Installing grab bars in the bedroom and bathroom. ? Using safety equipment, such as raised toilets and a seat in the shower. General instructions  Do not use any tobacco products, such as cigarettes, chewing tobacco, and e-cigarettes. If you need help quitting, ask your health care provider.  Limit alcohol intake to no more than 1 drink a day for nonpregnant women and 2 drinks a day for men. One drink equals 12 oz of beer, 5 oz of wine, or 1 oz of hard liquor.  If you need help to stop using drugs or alcohol, ask your health care provider about a referral to a program or specialist.  Maintain an active and healthy lifestyle. Get regular exercise as told by your health care provider.  Keep all follow-up visits as told by your health care provider, including visits with all specialists on your health care team. This is important. How  is this prevented? Your risk of another stroke can be decreased by managing high blood pressure, high cholesterol, diabetes, heart disease, sleep apnea, and obesity. It can also be decreased by quitting smoking, limiting alcohol, and staying physically active. Your health care provider will continue to work with you on measures to prevent short-term and long-term complications of stroke. Get help right away if: You have:  Sudden weakness or numbness in your face, arm, or leg, especially on one side of your body.  Sudden confusion.  Sudden trouble speaking, understanding, or both (aphasia).  Sudden trouble seeing with one or both eyes.  Sudden trouble walking or difficulty moving your arms or legs.  Sudden  dizziness.  Sudden loss of balance or coordination.  Sudden, severe headache with no known cause.  A partial or total loss of consciousness.  A seizure. Any of these symptoms may represent a serious problem that is an emergency. Do not wait to see if the symptoms will go away. Get medical help right away. Call your local emergency services (911 in [place] Do not drive yourself to the hospital. This information is not intended to replace advice given to you by your health care provider. Make sure you discuss any questions you have with your health care provider. Document Released: 08/31/2005 Document Revised: 02/11/2016 Document Reviewed: 11/27/2015 Elsevier Interactive Patient Education  2017 [employer].

## 2017-03-25 NOTE — Progress Notes (Signed)
Patient ID: Rachel Black, female   DOB: 02/26/1951, 66 y.o.   MRN: [id number]     Subjective:  I acted as a Education administrator for Dr. Carollee Herter.  Guerry Bruin, [place]   Patient ID: Rachel Black, female    DOB: 05/06/1951, 66 y.o.   MRN: [id number]  Chief Complaint  Patient presents with  . Hospitalization Follow-up    03/14/17-03/18/17    HPI  Patient is in today for hospital follow up.  She was in for CVA.  Feels like she is doing a little better. Pt was walking down the hall on 7/1 and dropped her water bottle -- her husband noticed she was slurring her speech and had a facial droop -- so they went to ER.  Pt states her L hand still feels numb and tingles.  No weakness in legs from stroke.  Pt denied PT and home health.  Pt needs neurology appointment and cardiology Pt had loop recorder placed and f/u with cardiology next week .    TEE was done-- PFO found  CT and MRI brain---  Acute nonhemorrhagic cortical infarct R parietal lobe MRA ---   Patient Care Team: Carollee Herter, Alferd Apa, DO as PCP - General   Past Medical History:  Diagnosis Date  . Arthritis    PAIN AND OA BOTH KNEES AND SHOULDERS AND ELBOWS AND WRIST  . Blood transfusion without reported diagnosis 2018   after cholecystectomy  . Diabetes mellitus    ORAL MEDICATION  . GERD (gastroesophageal reflux disease)   . Gout    NO RECENT FLARE UPS  . H/O: rheumatic fever    AS A CHILD - NO KNOWN HEART MURMUR OR HEART PROBLEMS  . Hyperlipidemia   . Hypertension   . Shortness of breath    WITH EXERTION    Past Surgical History:  Procedure Laterality Date  . ABDOMINAL HYSTERECTOMY    . CESAREAN SECTION    . CHOLECYSTECTOMY N/A 12/09/2016   Procedure: LAPAROSCOPIC CHOLECYSTECTOMY WITH INTRAOPERATIVE CHOLANGIOGRAM;  Surgeon: Stark Klein, MD;  Location: [facility];  Service: General;  Laterality: N/A;  . CHOLECYSTECTOMY  12/09/2016  . COLONOSCOPY WITH PROPOFOL N/A 01/30/2013   Procedure: COLONOSCOPY WITH PROPOFOL;  Surgeon: Irene Shipper,  MD;  Location: [facility];  Service: Endoscopy;  Laterality: N/A;  . ERCP N/A 12/08/2016   Procedure: ENDOSCOPIC RETROGRADE CHOLANGIOPANCREATOGRAPHY (ERCP);  Surgeon: Ladene Artist, MD;  Location: [facility];  Service: Endoscopy;  Laterality: N/A;  . ESOPHAGOGASTRODUODENOSCOPY (EGD) WITH PROPOFOL N/A 01/30/2013   Procedure: ESOPHAGOGASTRODUODENOSCOPY (EGD) WITH PROPOFOL;  Surgeon: Irene Shipper, MD;  Location: [facility];  Service: Endoscopy;  Laterality: N/A;  . Gallstone Surgeery  12/08/2016  . LOOP RECORDER INSERTION N/A 03/18/2017   Procedure: Loop Recorder Insertion;  Surgeon: Constance Haw, MD;  Location: [facility];  Service: Cardiovascular;  Laterality: N/A;  . PARTIAL HYSTERECTOMY    . TEE WITHOUT CARDIOVERSION N/A 03/16/2017   Procedure: TRANSESOPHAGEAL ECHOCARDIOGRAM (TEE);  Surgeon: Acie Fredrickson Wonda Cheng, MD;  Location: [facility];  Service: Cardiovascular;  Laterality: N/A;  . TOTAL KNEE ARTHROPLASTY Right 04/20/2014   Procedure: RIGHT TOTAL KNEE ARTHROPLASTY CONVERTED TO RIGHT KNEE REIMPLANTATION;  Surgeon: Mcarthur Rossetti, MD;  Location: [facility];  Service: Orthopedics;  Laterality: Right;  . TOTAL KNEE ARTHROPLASTY Left 08/23/2015   Procedure: LEFT TOTAL KNEE ARTHROPLASTY;  Surgeon: Mcarthur Rossetti, MD;  Location: [facility];  Service: Orthopedics;  Laterality: Left;  Marland Kitchen VENTRAL HERNIA REPAIR     x2  Family History  Problem Relation Age of Onset  . Diabetes Mother   . Hypertension Mother        entire family  . Stroke Father        CVA  . Hyperlipidemia Father        entire family  . Diabetes Father   . Heart disease Father   . Crohn's disease Son   . Irritable bowel syndrome Son     Social History   Social History  . Marital status: Married    Spouse name: N/A  . Number of children: 2  . Years of education: N/A   Occupational History  . housewife Unemployed   Social History Main Topics  . Smoking status: Never Smoker  . Smokeless  tobacco: Never Used  . Alcohol use No  . Drug use: No  . Sexual activity: Yes    Partners: Male   Other Topics Concern  . Not on file   Social History Narrative   No exercise secondary to knee pain    Outpatient Medications Prior to Visit  Medication Sig Dispense Refill  . allopurinol (ZYLOPRIM) 300 MG tablet TAKE 1 TABLET BY MOUTH 2 TIMES DAILY 180 tablet 0  . aspirin 325 MG tablet Take 1 tablet (325 mg total) by mouth daily. 30 tablet 3  . calcium carbonate (TUMS - DOSED IN MG ELEMENTAL CALCIUM) 500 MG chewable tablet Chew 2 tablets by mouth 3 (three) times daily as needed for indigestion or heartburn.    . calcium-vitamin D (OSCAL WITH D) 500-200 MG-UNIT per tablet Take 1 tablet by mouth daily with breakfast.     . DEXILANT 60 MG capsule TAKE 1 CAPSULE (60 MG TOTAL) BY MOUTH EVERY MORNING 90 capsule 3  . fenofibrate 160 MG tablet TAKE 1 TABLET (160 MG TOTAL) BY MOUTH DAILY. 90 tablet 1  . ferrous sulfate 325 (65 FE) MG EC tablet Take 1 tablet (325 mg total) by mouth 2 (two) times daily. 60 tablet 11  . JANUMET XR [phone] MG TB24 TAKE 1 TABLET BY MOUTH EVERY DAY 90 tablet 1  . ONE TOUCH ULTRA TEST test strip ONETOUCH ULTRA BLUE TEST STRIPS CHECK BLOOD SUGAR DAILY. DX: E11.9 100 each 12  . ONETOUCH DELICA LANCETS FINE MISC Check glucose qd 100 each 12  . potassium chloride SA (K-DUR,KLOR-CON) 20 MEQ tablet Take 1 tablet (20 mEq total) by mouth daily. 90 tablet 1  . ranitidine (ZANTAC) 150 MG tablet TAKE 1 TABLET (150 MG TOTAL) BY MOUTH AT BEDTIME. (Patient taking differently: Take 150 mg by mouth 2 (two) times daily. ) 30 tablet 3  . rosuvastatin (CRESTOR) 20 MG tablet Take 1 tablet (20 mg total) by mouth daily. 90 tablet 3  . senna-docusate (SENOKOT-S) 8.6-50 MG tablet Take 1 tablet by mouth at bedtime as needed for mild constipation. 30 tablet 1  . valsartan-hydrochlorothiazide (DIOVAN-HCT) 80-12.5 MG tablet Take 1 tablet by mouth daily. 90 tablet 0   No facility-administered  medications prior to visit.     Allergies  Allergen Reactions  . Other     SOME BANDAIDS CAUSE SKIN IRRITATION  . Penicillins Itching    Has patient had a PCN reaction causing immediate rash, facial/tongue/throat swelling, SOB or lightheadedness with hypotension: No Has patient had a PCN reaction causing severe rash involving mucus membranes or skin necrosis: No Has patient had a PCN reaction that required hospitalization No Has patient had a PCN reaction occurring within the last 10 years: No If all of  the above answers are "NO", then may proceed with Cephalosporin use.     Review of Systems  Constitutional: Negative for fever and malaise/fatigue.  HENT: Negative for congestion.   Eyes: Negative for blurred vision.  Respiratory: Negative for cough and shortness of breath.   Cardiovascular: Negative for chest pain, palpitations and leg swelling.  Gastrointestinal: Negative for vomiting.  Musculoskeletal: Negative for back pain.  Skin: Negative for rash.  Neurological: Negative for loss of consciousness and headaches.       Objective:    Physical Exam  Constitutional: She is oriented to person, place, and time. She appears well-developed and well-nourished.  HENT:  Head: Normocephalic and atraumatic.  Eyes: Conjunctivae and EOM are normal.  Neck: Normal range of motion. Neck supple. No JVD present. Carotid bruit is not present. No thyromegaly present.  Cardiovascular: Normal rate, regular rhythm and normal heart sounds.   No murmur heard. Pulmonary/Chest: Effort normal and breath sounds normal. No respiratory distress. She has no wheezes. She has no rales. She exhibits no tenderness.  Musculoskeletal: She exhibits no edema.  Neurological: She is alert and oriented to person, place, and time.  Cn 2-12 intact Slight decreased strength in L shoulder 4/5  Otherwise normal strength  Psychiatric: She has a normal mood and affect. Her behavior is normal. Judgment and thought  content normal.  Nursing note and vitals reviewed.   BP 108/70 (BP Location: Right Arm, Cuff Size: Large)   Pulse 84   Temp 98.1 F (36.7 C) (Oral)   Resp 16   Ht 5' 6"  (1.676 m)   Wt 259 lb (117.5 kg)   SpO2 98%   BMI 41.80 kg/m  Wt Readings from Last 3 Encounters:  03/25/17 259 lb (117.5 kg)  03/14/17 269 lb (122 kg)  02/16/17 269 lb (122 kg)   BP Readings from Last 3 Encounters:  03/25/17 108/70  03/18/17 137/60  02/16/17 (!) 125/91     Immunization History  Administered Date(s) Administered  . Influenza Split 07/28/2011  . Influenza Whole 07/16/2008, 07/03/2009, 08/05/2010  . Influenza,inj,Quad PF,36+ Mos 05/26/2013  . Influenza-Unspecified 08/15/2015  . Pneumococcal Polysaccharide-23 08/20/2004, 05/26/2013  . Td 10/06/2001  . Tdap 04/10/2014    Health Maintenance  Topic Date Due  . MAMMOGRAM  10/12/2013  . COLONOSCOPY  01/30/2014  . OPHTHALMOLOGY EXAM  03/27/2015  . FOOT EXAM  02/12/2017  . INFLUENZA VACCINE  04/14/2017  . HEMOGLOBIN A1C  09/15/2017  . TETANUS/TDAP  04/10/2024  . Hepatitis C Screening  Completed  . HIV Screening  Completed    Lab Results  Component Value Date   WBC 6.1 03/25/2017   HGB 11.4 (L) 03/25/2017   HCT 34.5 (L) 03/25/2017   PLT 104.0 (L) 03/25/2017   GLUCOSE 118 (H) 03/25/2017   CHOL 125 03/15/2017   TRIG 284 (H) 03/15/2017   HDL 23 (L) 03/15/2017   LDLDIRECT 37.0 12/18/2016   LDLCALC 45 03/15/2017   ALT 16 03/25/2017   AST 21 03/25/2017   NA 139 03/25/2017   K 4.2 03/25/2017   CL 109 03/25/2017   CREATININE 1.28 (H) 03/25/2017   BUN 24 (H) 03/25/2017   CO2 21 03/25/2017   TSH 3.59 12/18/2016   INR 0.96 03/14/2017   HGBA1C 5.4 03/15/2017   MICROALBUR <0.7 11/20/2014    Lab Results  Component Value Date   TSH 3.59 12/18/2016   Lab Results  Component Value Date   WBC 6.1 03/25/2017   HGB 11.4 (L) 03/25/2017   HCT  34.5 (L) 03/25/2017   MCV 88.6 03/25/2017   PLT 104.0 (L) 03/25/2017   Lab Results    Component Value Date   NA 139 03/25/2017   K 4.2 03/25/2017   CO2 21 03/25/2017   GLUCOSE 118 (H) 03/25/2017   BUN 24 (H) 03/25/2017   CREATININE 1.28 (H) 03/25/2017   BILITOT 0.4 03/25/2017   ALKPHOS 34 (L) 03/25/2017   AST 21 03/25/2017   ALT 16 03/25/2017   PROT 6.2 03/25/2017   ALBUMIN 4.1 03/25/2017   CALCIUM 9.9 03/25/2017   ANIONGAP 8 03/18/2017   GFR 44.51 (L) 03/25/2017   Lab Results  Component Value Date   CHOL 125 03/15/2017   Lab Results  Component Value Date   HDL 23 (L) 03/15/2017   Lab Results  Component Value Date   LDLCALC 45 03/15/2017   Lab Results  Component Value Date   TRIG 284 (H) 03/15/2017   Lab Results  Component Value Date   CHOLHDL 5.4 03/15/2017   Lab Results  Component Value Date   HGBA1C 5.4 03/15/2017         Assessment & Plan:   Problem List Items Addressed This Visit      Unprioritized   Acute CVA (cerebrovascular accident) ([place]) - Primary   Relevant Orders   Ambulatory referral to Neurology   CBC with Differential/Platelet (Completed)   Comprehensive metabolic panel (Completed)   DM2 (diabetes mellitus, type 2) ([place]) (Chronic)    This SmartLink has not been configured with any valid records.    Lab Results  Component Value Date   HGBA1C 5.4 03/15/2017  con't meds hgba1c acceptable, minimize simple carbs. Increase exercise as tolerated. Continue current meds       Essential hypertension (Chronic)    Well controlled, no changes to meds. Encouraged heart healthy diet such as the DASH diet and exercise as tolerated.       Hyperlipidemia    Tolerating statin, encouraged heart healthy diet, avoid trans fats, minimize simple carbs and saturated fats. Increase exercise as tolerated       Other Visit Diagnoses    Renal insufficiency       Relevant Orders   CBC with Differential/Platelet (Completed)   Comprehensive metabolic panel (Completed)      I am having Ms. Belton maintain her calcium-vitamin D, calcium  carbonate, ONETOUCH DELICA LANCETS FINE, ONE TOUCH ULTRA TEST, allopurinol, DEXILANT, fenofibrate, ferrous sulfate, ranitidine, JANUMET XR, senna-docusate, valsartan-hydrochlorothiazide, aspirin, rosuvastatin, and potassium chloride SA.  No orders of the defined types were placed in this encounter.   CMA served as Education administrator during this visit. History, Physical and Plan performed by medical provider. Documentation and orders reviewed and attested to.  Ann Held, DO

## 2017-03-26 NOTE — Addendum Note (Signed)
Addendum  created 03/26/17 1346 by Lyndle Herrlich, MD   Sign clinical note

## 2017-03-26 NOTE — Anesthesia Postprocedure Evaluation (Signed)
Anesthesia Post Note  Patient: Rachel Black  Procedure(s) Performed: Procedure(s) (LRB): ENDOSCOPIC RETROGRADE CHOLANGIOPANCREATOGRAPHY (ERCP) (N/A)     Anesthesia Post Evaluation  Last Vitals:  Vitals:   12/10/16 1528 12/10/16 1705  BP: (!) 133/59 (!) 152/63  Pulse: (!) 50 (!) 59  Resp: 20 20  Temp: 36.9 C 37.2 C    Last Pain:  Vitals:   12/10/16 1705  TempSrc: Oral  PainSc:                  Riccardo Dubin

## 2017-03-28 NOTE — Assessment & Plan Note (Signed)
Well controlled, no changes to meds. Encouraged heart healthy diet such as the DASH diet and exercise as tolerated.  °

## 2017-03-28 NOTE — Assessment & Plan Note (Signed)
>>  ASSESSMENT AND PLAN FOR MIXED HYPERLIPIDEMIA WRITTEN ON 03/28/2017  1:03 PM BY LOWNE CHASE, YVONNE R, DO  Tolerating statin, encouraged heart healthy diet, avoid trans fats, minimize simple carbs and saturated fats. Increase exercise as tolerated

## 2017-03-28 NOTE — Assessment & Plan Note (Signed)
>>  ASSESSMENT AND PLAN FOR ESSENTIAL HYPERTENSION WRITTEN ON 03/28/2017  1:02 PM BY LOWNE CHASE, YVONNE R, DO  Well controlled, no changes to meds. Encouraged heart healthy diet such as the DASH diet and exercise as tolerated.

## 2017-03-28 NOTE — Assessment & Plan Note (Signed)
Tolerating statin, encouraged heart healthy diet, avoid trans fats, minimize simple carbs and saturated fats. Increase exercise as tolerated 

## 2017-03-28 NOTE — Assessment & Plan Note (Signed)
This SmartLink has not been configured with any valid records.    Lab Results  Component Value Date   HGBA1C 5.4 03/15/2017  con't meds hgba1c acceptable, minimize simple carbs. Increase exercise as tolerated. Continue current meds

## 2017-03-29 ENCOUNTER — Other Ambulatory Visit: Payer: Self-pay

## 2017-03-29 MED ORDER — RANITIDINE HCL 150 MG PO TABS: 150.0000 mg | ORAL_TABLET | Freq: Every day | ORAL | 1 refills | Status: DC

## 2017-03-29 NOTE — Telephone Encounter (Signed)
[facility] requesting 90-day-supply.  New prescription sent electronically.

## 2017-03-31 ENCOUNTER — Encounter: Payer: Self-pay | Admitting: Internal Medicine

## 2017-03-31 ENCOUNTER — Ambulatory Visit (INDEPENDENT_AMBULATORY_CARE_PROVIDER_SITE_OTHER): Payer: Self-pay | Admitting: *Deleted

## 2017-03-31 ENCOUNTER — Ambulatory Visit (INDEPENDENT_AMBULATORY_CARE_PROVIDER_SITE_OTHER): Payer: BLUE CROSS/BLUE SHIELD | Admitting: Internal Medicine

## 2017-03-31 VITALS — BP 110/66 | HR 92

## 2017-03-31 DIAGNOSIS — D509 Iron deficiency anemia, unspecified: Secondary | ICD-10-CM

## 2017-03-31 DIAGNOSIS — K219 Gastro-esophageal reflux disease without esophagitis: Secondary | ICD-10-CM

## 2017-03-31 DIAGNOSIS — K317 Polyp of stomach and duodenum: Secondary | ICD-10-CM | POA: Diagnosis not present

## 2017-03-31 DIAGNOSIS — I639 Cerebral infarction, unspecified: Secondary | ICD-10-CM

## 2017-03-31 LAB — CUP PACEART INCLINIC DEVICE CHECK
Date Time Interrogation Session: 20180718121859
Implantable Pulse Generator Implant Date: 20180705

## 2017-03-31 NOTE — Progress Notes (Signed)
HISTORY OF PRESENT ILLNESS:  Rachel Black is a 66 y.o. female with MULTIPLE SIGNIFICANT medical problems who presents today for follow-up regarding iron deficiency anemia and hyperplastic gastric polyposis. The patient is accompanied by her husband and granddaughter. GI history is remarkable for adenomatous colon polyps with previous colonoscopies in 2004 and 2014, GERD requiring PPI therapy, choledocholithiasis status post ERCP with sphincterotomy and stone extraction 2018, and iron deficiency anemia identified at that time. Also, probable Nash cirrhosis. Patient's hemoglobin in March was in the 7 range. I performed upper endoscopy on 02/16/2017. She was found to have multiple small and large hyperplastic-appearing gastric polyps. 2 polyps exhibited spontaneous bleeding and were endoscopically resected and endoclipped. She was placed on iron therapy twice a day. She was to follow-up at this time. She was admitted to the hospital last week with a stroke. Still with some minor left-sided weakness in the upper extremity. Now on 1 aspirin 325 mg daily. She is wearing a loop monitor She kept this appointment. Hemoglobin last week was 11+, which is her baseline. GI complaints today include nocturnal reflux symptoms despite Dexilant 60 mg daily and ranitidine at night. Problems are clearly exacerbated by eating large meals and eating close to bedtime. She is significantly overweight. No dysphagia. No varices on a recent endoscopy. No problems with her bowels. Last colonoscopy May 2014 with diverticulosis and diminutive adenoma.  REVIEW OF SYSTEMS:  All non-GI ROS negative except for muscle cramps, ankle swelling  Past Medical History:  Diagnosis Date  . Arthritis    PAIN AND OA BOTH KNEES AND SHOULDERS AND ELBOWS AND WRIST  . Blood transfusion without reported diagnosis 2018   after cholecystectomy  . Diabetes mellitus    ORAL MEDICATION  . GERD (gastroesophageal reflux disease)   . Gout    NO RECENT  FLARE UPS  . H/O: rheumatic fever    AS A CHILD - NO KNOWN HEART MURMUR OR HEART PROBLEMS  . Hyperlipidemia   . Hypertension   . Shortness of breath    WITH EXERTION    Past Surgical History:  Procedure Laterality Date  . ABDOMINAL HYSTERECTOMY    . CESAREAN SECTION    . CHOLECYSTECTOMY N/A 12/09/2016   Procedure: LAPAROSCOPIC CHOLECYSTECTOMY WITH INTRAOPERATIVE CHOLANGIOGRAM;  Surgeon: Stark Klein, MD;  Location: [facility];  Service: General;  Laterality: N/A;  . CHOLECYSTECTOMY  12/09/2016  . COLONOSCOPY WITH PROPOFOL N/A 01/30/2013   Procedure: COLONOSCOPY WITH PROPOFOL;  Surgeon: Irene Shipper, MD;  Location: [facility];  Service: Endoscopy;  Laterality: N/A;  . ERCP N/A 12/08/2016   Procedure: ENDOSCOPIC RETROGRADE CHOLANGIOPANCREATOGRAPHY (ERCP);  Surgeon: Ladene Artist, MD;  Location: [facility];  Service: Endoscopy;  Laterality: N/A;  . ESOPHAGOGASTRODUODENOSCOPY (EGD) WITH PROPOFOL N/A 01/30/2013   Procedure: ESOPHAGOGASTRODUODENOSCOPY (EGD) WITH PROPOFOL;  Surgeon: Irene Shipper, MD;  Location: [facility];  Service: Endoscopy;  Laterality: N/A;  . Gallstone Surgeery  12/08/2016  . LOOP RECORDER INSERTION N/A 03/18/2017   Procedure: Loop Recorder Insertion;  Surgeon: Constance Haw, MD;  Location: [facility];  Service: Cardiovascular;  Laterality: N/A;  . PARTIAL HYSTERECTOMY    . TEE WITHOUT CARDIOVERSION N/A 03/16/2017   Procedure: TRANSESOPHAGEAL ECHOCARDIOGRAM (TEE);  Surgeon: Acie Fredrickson Wonda Cheng, MD;  Location: [facility];  Service: Cardiovascular;  Laterality: N/A;  . TOTAL KNEE ARTHROPLASTY Right 04/20/2014   Procedure: RIGHT TOTAL KNEE ARTHROPLASTY CONVERTED TO RIGHT KNEE REIMPLANTATION;  Surgeon: Mcarthur Rossetti, MD;  Location: [facility];  Service: Orthopedics;  Laterality: Right;  . TOTAL KNEE ARTHROPLASTY Left 08/23/2015   Procedure: LEFT TOTAL KNEE ARTHROPLASTY;  Surgeon: Mcarthur Rossetti, MD;  Location: [facility];  Service: Orthopedics;  Laterality:  Left;  Marland Kitchen VENTRAL HERNIA REPAIR     x2    Social History Rachel Black  reports that she has never smoked. She has never used smokeless tobacco. She reports that she does not drink alcohol or use drugs.  family history includes Crohn's disease in her son; Diabetes in her father and mother; Heart disease in her father; Hyperlipidemia in her father; Hypertension in her mother; Irritable bowel syndrome in her son; Stroke in her father.  Allergies  Allergen Reactions  . Other     SOME BANDAIDS CAUSE SKIN IRRITATION  . Penicillins Itching    Has patient had a PCN reaction causing immediate rash, facial/tongue/throat swelling, SOB or lightheadedness with hypotension: No Has patient had a PCN reaction causing severe rash involving mucus membranes or skin necrosis: No Has patient had a PCN reaction that required hospitalization No Has patient had a PCN reaction occurring within the last 10 years: No If all of the above answers are "NO", then may proceed with Cephalosporin use.        PHYSICAL EXAMINATION: Vital signs: BP 110/66 (Cuff Size: Large)   Pulse 92   Constitutional: Chronically ill-appearing, obese, no acute distress Psychiatric: alert and oriented x3, cooperative Eyes: extraocular movements intact, anicteric, conjunctiva pink Mouth: oral pharynx moist, no lesions Neck: supple no lymphadenopathy Cardiovascular: heart regular rate and rhythm, no murmur Lungs: clear to auscultation bilaterally Abdomen: soft, obese, nontender, nondistended, no obvious ascites, no peritoneal signs, normal bowel sounds, no organomegaly Rectal:Omitted Extremities: no clubbing or cyanosis. 1+ lower extremity edema bilaterally Skin: no lesions on visible extremities Neuro: Left upper extremity weakness with grip. Mild.. No asterixis.    ASSESSMENT:  #1. Iron deficiency anemia secondary to chronic blood loss from gastric polyps. Hyperplastic. Anemia has responded nicely to iron placement  therapy #2. Multiple hyperplastic gastric polyps. Status post resection of 2 larger and bleeding lesions #3. History of adenomatous colon polyps. Last colonoscopy May 2014. Due for follow-up (if medically fit) around May 2019 #4. Probable NASH cirrhosis. Compensated #5. Chronic GERD. Deteriorated Despite medical therapy. Risk factors are obesity and poor eating habits #6. Multiple medical problems including recent stroke   PLAN:  #1. Reflux precautions #2. weight loss #3. continue PPI and H2 receptor antagonist therapy #4. Continue iron therapy twice daily #5. Repeat CBC and ferritin in one month #6. Consider surveillance colonoscopy next year pending clinical status #7. GI Office follow-up 3 months. Patient could have additional larger gastric polyps resected if clinically necessary. For now, not needed. #8. Resume general medical care with PCP and multiple specialists 40 minutes spent face-to-face with the patient. Greater than 50% a time as use for counseling regarding her iron deficiency anemia, gastric polyposis, deteriorated GERD, status of cirrhosis, and answering multiple questions. Treatment plan follow-up recommendations carefully reviewed as well

## 2017-03-31 NOTE — Patient Instructions (Addendum)
Your physician has requested that you go to the basement of our office on August 15th for CBC labwork and monthly thereafter until directed otherwise by Dr. Henrene Pastor.  Please follow up with our office in 3 months (October 2018).  If you are age 65 or older, your body mass index should be between 23-30. Your There is no height or weight on file to calculate BMI. If this is out of the aforementioned range listed, please consider follow up with your Primary Care Provider.  If you are age 36 or younger, your body mass index should be between 19-25. Your There is no height or weight on file to calculate BMI. If this is out of the aformentioned range listed, please consider follow up with your Primary Care Provider.

## 2017-03-31 NOTE — Progress Notes (Signed)
Wound check appointment. Steri-strips removed. Wound without redness or edema. Incision edges approximated, wound well healed. Normal device function. Battery status: good. R-waves 0.38mV. 1 symptom episode--false, patient education post-implant. 2 pause episodes--false, from implant. No tachy, brady, or AF episodes. Reprogrammed ILR for cryptogenic stroke indication as patient presented with "suspected AF" indication--AF detection to balanced sensitivity, ectopy rejection to aggressive, and storage of all AF episodes. Patient and husband educated about Carelink monitor and symptom activator. Patient agrees to call the [facility] if she ever uses her symptom activator to report symptoms. Monthly summary reports and ROV with WC PRN.

## 2017-04-01 ENCOUNTER — Other Ambulatory Visit: Payer: Self-pay | Admitting: Family Medicine

## 2017-04-01 DIAGNOSIS — R799 Abnormal finding of blood chemistry, unspecified: Secondary | ICD-10-CM

## 2017-04-01 DIAGNOSIS — R7989 Other specified abnormal findings of blood chemistry: Secondary | ICD-10-CM

## 2017-04-13 ENCOUNTER — Other Ambulatory Visit: Payer: Self-pay | Admitting: Family Medicine

## 2017-04-13 DIAGNOSIS — M1000 Idiopathic gout, unspecified site: Secondary | ICD-10-CM

## 2017-04-15 ENCOUNTER — Other Ambulatory Visit (INDEPENDENT_AMBULATORY_CARE_PROVIDER_SITE_OTHER): Payer: BLUE CROSS/BLUE SHIELD

## 2017-04-15 DIAGNOSIS — R7989 Other specified abnormal findings of blood chemistry: Secondary | ICD-10-CM

## 2017-04-15 DIAGNOSIS — R799 Abnormal finding of blood chemistry, unspecified: Secondary | ICD-10-CM

## 2017-04-16 LAB — COMPREHENSIVE METABOLIC PANEL
ALT: 20 U/L (ref 0–35)
AST: 29 U/L (ref 0–37)
Albumin: 4 g/dL (ref 3.5–5.2)
Alkaline Phosphatase: 35 U/L — ABNORMAL LOW (ref 39–117)
BILIRUBIN TOTAL: 0.3 mg/dL (ref 0.2–1.2)
BUN: 19 mg/dL (ref 6–23)
CALCIUM: 9.7 mg/dL (ref 8.4–10.5)
CHLORIDE: 107 meq/L (ref 96–112)
CO2: 26 meq/L (ref 19–32)
CREATININE: 1.22 mg/dL — AB (ref 0.40–1.20)
GFR: 47.04 mL/min — ABNORMAL LOW (ref >60.00)
GLUCOSE: 137 mg/dL — AB (ref 70–99)
Potassium: 4.1 mEq/L (ref 3.5–5.1)
Sodium: 139 mEq/L (ref 135–145)
Total Protein: 5.9 g/dL — ABNORMAL LOW (ref 6.0–8.3)

## 2017-04-19 ENCOUNTER — Ambulatory Visit (INDEPENDENT_AMBULATORY_CARE_PROVIDER_SITE_OTHER): Payer: BLUE CROSS/BLUE SHIELD | Admitting: *Deleted

## 2017-04-19 DIAGNOSIS — I639 Cerebral infarction, unspecified: Secondary | ICD-10-CM | POA: Diagnosis not present

## 2017-04-19 NOTE — Progress Notes (Signed)
No new concerns. Kidneys look improved some, no changes

## 2017-04-20 NOTE — Progress Notes (Signed)
Carelink Summary Report / Loop Recorder 

## 2017-04-26 ENCOUNTER — Telehealth: Payer: Self-pay | Admitting: *Deleted

## 2017-04-26 NOTE — Telephone Encounter (Signed)
Patient called for labs that were done on 04/15/17.  Results given.

## 2017-04-28 ENCOUNTER — Other Ambulatory Visit (INDEPENDENT_AMBULATORY_CARE_PROVIDER_SITE_OTHER): Payer: BLUE CROSS/BLUE SHIELD

## 2017-04-28 ENCOUNTER — Other Ambulatory Visit: Payer: Self-pay | Admitting: Family Medicine

## 2017-04-28 DIAGNOSIS — I10 Essential (primary) hypertension: Secondary | ICD-10-CM

## 2017-04-28 DIAGNOSIS — D509 Iron deficiency anemia, unspecified: Secondary | ICD-10-CM | POA: Diagnosis not present

## 2017-04-28 LAB — CBC WITH DIFFERENTIAL/PLATELET
Basophils Absolute: 0 10*3/uL (ref 0.0–0.1)
Basophils Relative: 0.4 % (ref 0.0–3.0)
EOS ABS: 0 10*3/uL (ref 0.0–0.7)
Eosinophils Relative: 0 % (ref 0.0–5.0)
HCT: 35.6 % — ABNORMAL LOW (ref 36.0–46.0)
HEMOGLOBIN: 11.6 g/dL — AB (ref 12.0–15.0)
Lymphocytes Relative: 31.5 % (ref 12.0–46.0)
Lymphs Abs: 1.8 10*3/uL (ref 0.7–4.0)
MCHC: 32.7 g/dL (ref 30.0–36.0)
MCV: 91.9 fl (ref 78.0–100.0)
MONO ABS: 0.3 10*3/uL (ref 0.1–1.0)
Monocytes Relative: 4.5 % (ref 3.0–12.0)
Neutro Abs: 3.6 10*3/uL (ref 1.4–7.7)
Neutrophils Relative %: 63.6 % (ref 43.0–77.0)
Platelets: 122 10*3/uL — ABNORMAL LOW (ref 150.0–400.0)
RBC: 3.87 Mil/uL (ref 3.87–5.11)
RDW: 16.9 % — ABNORMAL HIGH (ref 11.5–15.5)
WBC: 5.7 10*3/uL (ref 4.0–10.5)

## 2017-04-28 LAB — CUP PACEART REMOTE DEVICE CHECK
Date Time Interrogation Session: 20180804153649
Implantable Pulse Generator Implant Date: 20180705

## 2017-04-29 ENCOUNTER — Other Ambulatory Visit: Payer: Self-pay

## 2017-04-29 DIAGNOSIS — D509 Iron deficiency anemia, unspecified: Secondary | ICD-10-CM

## 2017-05-18 ENCOUNTER — Ambulatory Visit (INDEPENDENT_AMBULATORY_CARE_PROVIDER_SITE_OTHER): Payer: BLUE CROSS/BLUE SHIELD | Admitting: *Deleted

## 2017-05-18 DIAGNOSIS — I639 Cerebral infarction, unspecified: Secondary | ICD-10-CM | POA: Diagnosis not present

## 2017-05-20 NOTE — Progress Notes (Signed)
Carelink Summary Report / Loop Recorder 

## 2017-05-23 LAB — CUP PACEART REMOTE DEVICE CHECK
Date Time Interrogation Session: 20180903153559
MDC IDC PG IMPLANT DT: 20180705

## 2017-06-01 ENCOUNTER — Ambulatory Visit: Payer: Self-pay | Admitting: Neurology

## 2017-06-16 ENCOUNTER — Ambulatory Visit (INDEPENDENT_AMBULATORY_CARE_PROVIDER_SITE_OTHER): Payer: BLUE CROSS/BLUE SHIELD | Admitting: *Deleted

## 2017-06-16 DIAGNOSIS — I639 Cerebral infarction, unspecified: Secondary | ICD-10-CM | POA: Diagnosis not present

## 2017-06-16 NOTE — Progress Notes (Signed)
Carelink Summary Report / Loop Recorder 

## 2017-06-18 LAB — CUP PACEART REMOTE DEVICE CHECK
Date Time Interrogation Session: 20181003160717
Implantable Pulse Generator Implant Date: 20180705

## 2017-06-22 ENCOUNTER — Encounter: Payer: BLUE CROSS/BLUE SHIELD | Admitting: Family Medicine

## 2017-06-29 ENCOUNTER — Ambulatory Visit (INDEPENDENT_AMBULATORY_CARE_PROVIDER_SITE_OTHER): Payer: BLUE CROSS/BLUE SHIELD | Admitting: Neurology

## 2017-06-29 ENCOUNTER — Encounter: Payer: Self-pay | Admitting: Neurology

## 2017-06-29 VITALS — BP 115/70 | HR 65 | Wt 266.0 lb

## 2017-06-29 DIAGNOSIS — Z8679 Personal history of other diseases of the circulatory system: Secondary | ICD-10-CM

## 2017-06-29 DIAGNOSIS — I10 Essential (primary) hypertension: Secondary | ICD-10-CM | POA: Diagnosis not present

## 2017-06-29 DIAGNOSIS — I63411 Cerebral infarction due to embolism of right middle cerebral artery: Secondary | ICD-10-CM

## 2017-06-29 DIAGNOSIS — E1159 Type 2 diabetes mellitus with other circulatory complications: Secondary | ICD-10-CM

## 2017-06-29 DIAGNOSIS — E782 Mixed hyperlipidemia: Secondary | ICD-10-CM | POA: Diagnosis not present

## 2017-06-29 DIAGNOSIS — I639 Cerebral infarction, unspecified: Secondary | ICD-10-CM

## 2017-06-29 NOTE — Patient Instructions (Addendum)
-   continue ASA and crestor for stroke prevention  - continue loop recorder monitoring. - check BP and glucose at home and record. - Follow up with your primary care physician for stroke risk factor modification. Recommend maintain blood pressure goal <130/80, diabetes with hemoglobin A1c goal below 7.0% and lipids with LDL cholesterol goal below 70 mg/dL.  - healthy diet and regular exercise - follow up in 4 months.

## 2017-06-30 DIAGNOSIS — Z8679 Personal history of other diseases of the circulatory system: Secondary | ICD-10-CM | POA: Insufficient documentation

## 2017-06-30 HISTORY — DX: Personal history of other diseases of the circulatory system: Z86.79

## 2017-06-30 NOTE — Progress Notes (Signed)
STROKE NEUROLOGY FOLLOW UP NOTE  NAME: Rachel Black DOB: 1951/07/01  REASON FOR VISIT: stroke follow up HISTORY FROM: Patient and chart  Today we had the pleasure of seeing Rachel Black in follow-up at our Neurology Clinic. Pt was accompanied by husband.   History Summary Rachel Black is a 66 y.o. female with history of rheumatic fever, hypertension (inconsistently takes her blood pressure medicine), diabetes mellitus and hyperlipidemia admitted on 03/14/2017 for left arm weakness and slurred speech. CT head acute cortical infarct in the high right right total lobe. MRI confirmed acute infarct along the right lateral motor strength. MRA head and a CT neck negative. EF 55-60%. TEE positive PFO but otherwise normal, LE venous Doppler negative for DVT. Loop recorder placed. LDL 45 and A1c 5.4. She was discharged with aspirin 325 and continue the Crestor and fenofibrate.  Interval History During the interval time, the patient has been doing well. Has HH PT/OT and left hand dexterity difficulty resolved. Currently walk with walker. Sugar controlled well at home, but does not check BP at home, today in clinic 115/70. Loop recorder so far no A. fib.  REVIEW OF SYSTEMS: Full 14 system review of systems performed and notable only for those listed below and in HPI above, all others are negative:  Constitutional:   Cardiovascular: Swelling in legs Ear/Nose/Throat:   Skin:  Eyes:   Respiratory:   Gastroitestinal:   Genitourinary:  Hematology/Lymphatic:   Endocrine:  Musculoskeletal:  Cramping Allergy/Immunology:   Neurological:   Psychiatric:  Sleep:   The following represents the patient's updated allergies and side effects list: Allergies  Allergen Reactions  . Other     SOME BANDAIDS CAUSE SKIN IRRITATION  . Penicillins Itching    Has patient had a PCN reaction causing immediate rash, facial/tongue/throat swelling, SOB or lightheadedness with hypotension: No Has patient  had a PCN reaction causing severe rash involving mucus membranes or skin necrosis: No Has patient had a PCN reaction that required hospitalization No Has patient had a PCN reaction occurring within the last 10 years: No If all of the above answers are "NO", then may proceed with Cephalosporin use.     The neurologically relevant items on the patient's problem list were reviewed on today's visit.  Neurologic Examination  A problem focused neurological exam (12 or more points of the single system neurologic examination, vital signs counts as 1 point, cranial nerves count for 8 points) was performed.  Blood pressure 115/70, pulse 65, weight 266 lb (120.7 kg).  General - Well nourished, well developed, in no apparent distress.  Ophthalmologic - Sharp disc margins OU.   Cardiovascular - Regular rate and rhythm with no murmur.  Mental Status -  Level of arousal and orientation to time, place, and person were intact. Language including expression, naming, repetition, comprehension was assessed and found intact. Fund of Knowledge was assessed and was intact.  Cranial Nerves II - XII - II - Visual field intact OU. III, IV, VI - Extraocular movements intact. V - Facial sensation intact bilaterally. VII - Facial movement intact bilaterally. VIII - Hearing & vestibular intact bilaterally. X - Palate elevates symmetrically. XI - Chin turning & shoulder shrug intact bilaterally. XII - Tongue protrusion intact.  Motor Strength - The patient's strength was normal in all extremities except chronic left foot drop with DF 3-/5 and pronator drift was absent.  Bulk was normal and fasciculations were absent.   Motor Tone - Muscle tone was assessed at  the neck and appendages and was normal.  Reflexes - The patient's reflexes were 1+ in all extremities and she had no pathological reflexes.  Sensory - Light touch, temperature/pinprick, vibration and proprioception, and Romberg testing were assessed  and were normal.    Coordination - The patient had normal movements in the hands and feet with no ataxia or dysmetria.  Tremor was absent.  Gait and Station - walk with walker, slow, but steady gait.   Functional score  mRS = 2   0 - No symptoms.   1 - No significant disability. Able to carry out all usual activities, despite some symptoms.   2 - Slight disability. Able to look after own affairs without assistance, but unable to carry out all previous activities.   3 - Moderate disability. Requires some help, but able to walk unassisted.   4 - Moderately severe disability. Unable to attend to own bodily needs without assistance, and unable to walk unassisted.   5 - Severe disability. Requires constant nursing care and attention, bedridden, incontinent.   6 - Dead.   NIH Stroke Scale = 0   Data reviewed: I personally reviewed the images and agree with the radiology interpretations.  Ct Head Wo Contrast 03/14/2017 IMPRESSION: Acute nonhemorrhagic cortical infarct at high RIGHT parietal lobe.  Ct Angio Neck W Or Wo Contrast 03/15/2017 IMPRESSION: Negative CTA of the neck. No stenosis, atherosclerosis, or embolic source.  Mr Brain 55 Contrast Mr Jodene Nam Head/brain Wo Cm 03/14/2017 IMPRESSION: 1. Acute infarct along the right lateral motor strip corresponding to the noncontrast CT finding earlier. Gyral cytotoxic edema but no associated hemorrhage or mass effect. 2.  Negative intracranial MRA. 3. Evidence of mild for age underlying small vessel disease.  TTE - Left ventricle: The cavity size was normal. Wall thickness wasnormal. Systolic function was normal. The estimated ejection fraction was in the range of 55% to 60%. Doppler parameters are consistent with abnormal left ventricular relaxation (grade 1diastolic dysfunction).  LE venous Doppler 03/15/2017 No evidence of deep vein or superficial thrombosis involving the right lower extremity and left lower  extremity.  TEE 03/16/2017 Left Ventrical:Normal LV  Mitral Valve:normal  Aortic Valve:normal Tricuspid Valve: normal  Pulmonic Valve: poorly visualized  Left Atrium/ Left atrial appendage:small, No thrombus  Atrial septum:+ PFO by bubble study  Aorta:mild atherosclerosis   Component     Latest Ref Rng & Units 03/15/2017  Cholesterol     0 - 200 mg/dL 125  Triglycerides     <150 mg/dL 284 (H)  HDL Cholesterol     >40 mg/dL 23 (L)  Total CHOL/HDL Ratio     RATIO 5.4  VLDL     0 - 40 mg/dL 57 (H)  LDL (calc)     0 - 99 mg/dL 45  Hemoglobin A1C     4.8 - 5.6 % 5.4  Mean Plasma Glucose     mg/dL 108    Assessment: As you may recall, she is a 66 y.o. African American female with PMH of rheumatic fever, hypertension (inconsistently takes her blood pressure medicine), diabetes mellitus and hyperlipidemia admitted on 03/14/2017 for acute infarct along the right lateral motor strength. MRA head and a CT neck negative. EF 55-60%. TEE positive PFO but otherwise normal, LE venous Doppler negative for DVT. Loop recorder placed. LDL 45 and A1c 5.4. She was discharged with aspirin 325 and continue the Crestor and fenofibrate. Has HH PT/OT and left hand dexterity difficulty resolved. Currently walk with walker. Loop recorder  so far no A. fib.  Plan:  - continue ASA and crestor for stroke prevention  - continue loop recorder monitoring. - check BP and glucose at home and record. - Follow up with your primary care physician for stroke risk factor modification. Recommend maintain blood pressure goal <130/80, diabetes with hemoglobin A1c goal below 7.0% and lipids with LDL cholesterol goal below 70 mg/dL.  - healthy diet and regular exercise - follow up in 4 months.   No orders of the defined types were placed in this encounter.   Meds ordered this encounter  Medications  . DISCONTD: ferrous sulfate 325 (65 FE) MG tablet    Sig: TAKE 1 TABLET (325 MG TOTAL) BY MOUTH 2 (TWO) TIMES  DAILY.    Refill:  11    Patient Instructions  - continue ASA and crestor for stroke prevention  - continue loop recorder monitoring. - check BP and glucose at home and record. - Follow up with your primary care physician for stroke risk factor modification. Recommend maintain blood pressure goal <130/80, diabetes with hemoglobin A1c goal below 7.0% and lipids with LDL cholesterol goal below 70 mg/dL.  - healthy diet and regular exercise - follow up in 4 months.    Rosalin Hawking, MD PhD [facility] [address] [place],  [address] [phone]

## 2017-07-06 ENCOUNTER — Ambulatory Visit: Payer: BLUE CROSS/BLUE SHIELD | Admitting: Internal Medicine

## 2017-07-06 ENCOUNTER — Ambulatory Visit (INDEPENDENT_AMBULATORY_CARE_PROVIDER_SITE_OTHER): Payer: BLUE CROSS/BLUE SHIELD | Admitting: Internal Medicine

## 2017-07-06 ENCOUNTER — Encounter: Payer: Self-pay | Admitting: Internal Medicine

## 2017-07-06 ENCOUNTER — Other Ambulatory Visit (INDEPENDENT_AMBULATORY_CARE_PROVIDER_SITE_OTHER): Payer: BLUE CROSS/BLUE SHIELD

## 2017-07-06 VITALS — BP 110/64 | HR 70 | Ht 66.0 in | Wt 266.4 lb

## 2017-07-06 DIAGNOSIS — K7460 Unspecified cirrhosis of liver: Secondary | ICD-10-CM | POA: Diagnosis not present

## 2017-07-06 DIAGNOSIS — D509 Iron deficiency anemia, unspecified: Secondary | ICD-10-CM

## 2017-07-06 DIAGNOSIS — K7581 Nonalcoholic steatohepatitis (NASH): Secondary | ICD-10-CM | POA: Diagnosis not present

## 2017-07-06 DIAGNOSIS — K219 Gastro-esophageal reflux disease without esophagitis: Secondary | ICD-10-CM

## 2017-07-06 DIAGNOSIS — K317 Polyp of stomach and duodenum: Secondary | ICD-10-CM | POA: Diagnosis not present

## 2017-07-06 LAB — CBC WITH DIFFERENTIAL/PLATELET
BASOS ABS: 0 10*3/uL (ref 0.0–0.1)
Basophils Relative: 0.7 % (ref 0.0–3.0)
EOS ABS: 0 10*3/uL (ref 0.0–0.7)
Eosinophils Relative: 0 % (ref 0.0–5.0)
HEMATOCRIT: 38.2 % (ref 36.0–46.0)
HEMOGLOBIN: 12.5 g/dL (ref 12.0–15.0)
LYMPHS PCT: 24.3 % (ref 12.0–46.0)
Lymphs Abs: 1.5 10*3/uL (ref 0.7–4.0)
MCHC: 32.8 g/dL (ref 30.0–36.0)
MCV: 92.9 fl (ref 78.0–100.0)
MONOS PCT: 4.7 % (ref 3.0–12.0)
Monocytes Absolute: 0.3 10*3/uL (ref 0.1–1.0)
Neutro Abs: 4.5 10*3/uL (ref 1.4–7.7)
Neutrophils Relative %: 70.3 % (ref 43.0–77.0)
Platelets: 108 10*3/uL — ABNORMAL LOW (ref 150.0–400.0)
RBC: 4.11 Mil/uL (ref 3.87–5.11)
RDW: 14.6 % (ref 11.5–15.5)
WBC: 6.3 10*3/uL (ref 4.0–10.5)

## 2017-07-06 LAB — FERRITIN: Ferritin: 46.4 ng/mL (ref 10.0–291.0)

## 2017-07-06 MED ORDER — RANITIDINE HCL 150 MG PO TABS: ORAL_TABLET | ORAL | 3 refills | Status: DC

## 2017-07-06 MED ORDER — DEXLANSOPRAZOLE 60 MG PO CPDR: DELAYED_RELEASE_CAPSULE | ORAL | 3 refills | Status: DC

## 2017-07-06 MED ORDER — FERROUS SULFATE 325 (65 FE) MG PO TBEC: 325.0000 mg | DELAYED_RELEASE_TABLET | Freq: Two times a day (BID) | ORAL | 11 refills | Status: DC

## 2017-07-06 NOTE — Patient Instructions (Addendum)
Your physician has requested that you go to the basement for the following lab work before leaving today:  CBC, Ferritin  We have sent the following medications to your pharmacy for you to pick up at your convenience:  Iron, Dexilant  Increase your Zantac to 38m at nght  Please follow up in one year

## 2017-07-06 NOTE — Progress Notes (Signed)
HISTORY OF PRESENT ILLNESS:  Rachel Black is a 66 y.o. female with MULTIPLE SIGNIFICANT medical problems including but not limited to morbid obesity, hypertension, hyperlipidemia, diabetes mellitus, prior stroke, and probable Karlene Lineman cirrhosis. In addition to liver problems, she is followed in this office for chronic GERD, adenomatous colon polyps, and iron deficiency anemia secondary to hyperplastic gastric polyposis. She was last seen in this office 03/31/2017. See that dictation for details. Her follow-up hemoglobin at that time was 11.6. She is status post gastric polypectomy in June 2018. She has continued on twice daily iron. Also on aspirin 3) 25 mg daily per her neurologist for stroke prevention. Last colonoscopy May 2014 with a diminutive polyp. Liver disease is felt to be compensated. No varices on most recent endoscopy. She is accompanied today by her husband for follow-up. First, she reports ongoing reflux symptoms, particularly at night, despite Dexilant 60 mg in the morning and ranitidine 150 mg at night. She uses antacids such as Tums on a regular basis, which helped. No dysphagia. No success with weight loss. Her other complaint is occasional loose stools which she relates to nervousness. She is no longer using laxatives for constipation. In terms of her liver disease, nothing to suggest volume overload, bleeding, or encephalopathy. She is accompanied by her husband. She requests flu shot  REVIEW OF SYSTEMS:  All non-GI ROS negative except for weakness, shortness of breath, unsteady gait, arthritis  Past Medical History:  Diagnosis Date  . Arthritis    PAIN AND OA BOTH KNEES AND SHOULDERS AND ELBOWS AND WRIST  . Blood transfusion without reported diagnosis 2018   after cholecystectomy  . Diabetes mellitus    ORAL MEDICATION  . GERD (gastroesophageal reflux disease)   . Gout    NO RECENT FLARE UPS  . H/O: rheumatic fever    AS A CHILD - NO KNOWN HEART MURMUR OR HEART PROBLEMS  .  Hyperlipidemia   . Hypertension   . Shortness of breath    WITH EXERTION  . Stroke [facility])     Past Surgical History:  Procedure Laterality Date  . ABDOMINAL HYSTERECTOMY    . CESAREAN SECTION    . CHOLECYSTECTOMY N/A 12/09/2016   Procedure: LAPAROSCOPIC CHOLECYSTECTOMY WITH INTRAOPERATIVE CHOLANGIOGRAM;  Surgeon: Stark Klein, MD;  Location: [facility];  Service: General;  Laterality: N/A;  . CHOLECYSTECTOMY  12/09/2016  . COLONOSCOPY WITH PROPOFOL N/A 01/30/2013   Procedure: COLONOSCOPY WITH PROPOFOL;  Surgeon: Irene Shipper, MD;  Location: [facility];  Service: Endoscopy;  Laterality: N/A;  . ERCP N/A 12/08/2016   Procedure: ENDOSCOPIC RETROGRADE CHOLANGIOPANCREATOGRAPHY (ERCP);  Surgeon: Ladene Artist, MD;  Location: [facility];  Service: Endoscopy;  Laterality: N/A;  . ESOPHAGOGASTRODUODENOSCOPY (EGD) WITH PROPOFOL N/A 01/30/2013   Procedure: ESOPHAGOGASTRODUODENOSCOPY (EGD) WITH PROPOFOL;  Surgeon: Irene Shipper, MD;  Location: [facility];  Service: Endoscopy;  Laterality: N/A;  . Gallstone Surgeery  12/08/2016  . LOOP RECORDER INSERTION N/A 03/18/2017   Procedure: Loop Recorder Insertion;  Surgeon: Constance Haw, MD;  Location: [facility];  Service: Cardiovascular;  Laterality: N/A;  . PARTIAL HYSTERECTOMY    . TEE WITHOUT CARDIOVERSION N/A 03/16/2017   Procedure: TRANSESOPHAGEAL ECHOCARDIOGRAM (TEE);  Surgeon: Acie Fredrickson Wonda Cheng, MD;  Location: [facility];  Service: Cardiovascular;  Laterality: N/A;  . TOTAL KNEE ARTHROPLASTY Right 04/20/2014   Procedure: RIGHT TOTAL KNEE ARTHROPLASTY CONVERTED TO RIGHT KNEE REIMPLANTATION;  Surgeon: Mcarthur Rossetti, MD;  Location: [facility];  Service: Orthopedics;  Laterality: Right;  .  TOTAL KNEE ARTHROPLASTY Left 08/23/2015   Procedure: LEFT TOTAL KNEE ARTHROPLASTY;  Surgeon: Mcarthur Rossetti, MD;  Location: [facility];  Service: Orthopedics;  Laterality: Left;  Marland Kitchen VENTRAL HERNIA REPAIR     x2    Social History Rachel Black   reports that she has never smoked. She has never used smokeless tobacco. She reports that she does not drink alcohol or use drugs.  family history includes Crohn's disease in her son; Diabetes in her father and mother; Heart disease in her father; Hyperlipidemia in her father; Hypertension in her mother; Irritable bowel syndrome in her son; Stroke in her father.  Allergies  Allergen Reactions  . Other     SOME BANDAIDS CAUSE SKIN IRRITATION  . Penicillins Itching    Has patient had a PCN reaction causing immediate rash, facial/tongue/throat swelling, SOB or lightheadedness with hypotension: No Has patient had a PCN reaction causing severe rash involving mucus membranes or skin necrosis: No Has patient had a PCN reaction that required hospitalization No Has patient had a PCN reaction occurring within the last 10 years: No If all of the above answers are "NO", then may proceed with Cephalosporin use.        PHYSICAL EXAMINATION: Vital signs: BP 110/64   Pulse 70   Ht 5\' 6"  (1.676 m)   Wt 266 lb 6.4 oz (120.8 kg)   SpO2 96%   BMI 43.00 kg/m   Constitutional: Pleasant, obese, chronically ill-appearing., no acute distress Psychiatric: alert and oriented x3, cooperative Eyes: extraocular movements intact, anicteric, conjunctiva pink Mouth: oral pharynx moist, no lesions Neck: supple no lymphadenopathy Cardiovascular: heart regular rate and rhythm, no murmur. Loop recorder in place Lungs: clear to auscultation bilaterally Abdomen: soft, obese, nontender, nondistended, no obvious ascites, no peritoneal signs, normal bowel sounds, no organomegaly Rectal: Omitted Extremities: no clubbing or cyanosis. 1+ lower extremity edema bilaterally Skin: no lesions on visible extremities Neuro: No focal deficits except left foot drop. Cranial nerves intact. No asterixis.   ASSESSMENT:  #1. GERD. Breakthrough symptoms despite medical regimen #2. Iron deficiency anemia secondary to hyperplastic  gastric polyposis status post limited gastric polypectomy June 2018. On chronic iron therapy #3. History of adenomatous colon polyps. Last colonoscopy 2014 with 1 small adenoma and diverticulosis #4. Morbid obesity and multiple medical problems #5. Probable NASH cirrhosis. Compensated   PLAN:  #1. Reflux precautions. Reviewed #2. Weight loss. Critically important #3. Continue excellent 60 mg daily #4. Increase ranitidine to 300 mg at night. Prescription rewritten and submitted #5. Continue iron twice daily #6. CBC and ferritin level today. We will contact the patient with the results and additional recommendations #7. We do not have influenza vaccination available today I'm told. She can get this through her PCP or local pharmacy #8. Consider surveillance colonoscopy next year. However, overall health status to be considered #9. Routine GI follow-up one year  25 minutes spent face-to-face with the patient. Greater than 50% a time was use for counseling regarding her uncontrolled GERD, iron deficiency anemia secondary to gastric polyposis, and NASH associated liver disease

## 2017-07-16 ENCOUNTER — Ambulatory Visit (INDEPENDENT_AMBULATORY_CARE_PROVIDER_SITE_OTHER): Payer: BLUE CROSS/BLUE SHIELD | Admitting: *Deleted

## 2017-07-16 DIAGNOSIS — I639 Cerebral infarction, unspecified: Secondary | ICD-10-CM

## 2017-07-16 NOTE — Progress Notes (Signed)
Carelink Summary Report / Loop Recorder 

## 2017-07-18 ENCOUNTER — Other Ambulatory Visit: Payer: Self-pay | Admitting: Family Medicine

## 2017-07-19 LAB — CUP PACEART REMOTE DEVICE CHECK
MDC IDC PG IMPLANT DT: 20180705
MDC IDC SESS DTM: 20181102163632

## 2017-07-21 ENCOUNTER — Other Ambulatory Visit: Payer: Self-pay | Admitting: Nurse Practitioner

## 2017-07-22 ENCOUNTER — Other Ambulatory Visit: Payer: Self-pay | Admitting: Family Medicine

## 2017-07-22 DIAGNOSIS — M1000 Idiopathic gout, unspecified site: Secondary | ICD-10-CM

## 2017-07-23 ENCOUNTER — Other Ambulatory Visit: Payer: Self-pay | Admitting: Family Medicine

## 2017-07-23 DIAGNOSIS — M1000 Idiopathic gout, unspecified site: Secondary | ICD-10-CM

## 2017-07-23 NOTE — Telephone Encounter (Signed)
Allopurinol refilled

## 2017-07-26 ENCOUNTER — Other Ambulatory Visit: Payer: Self-pay | Admitting: Family Medicine

## 2017-07-26 DIAGNOSIS — I10 Essential (primary) hypertension: Secondary | ICD-10-CM

## 2017-07-27 ENCOUNTER — Other Ambulatory Visit: Payer: Self-pay | Admitting: Family Medicine

## 2017-07-27 DIAGNOSIS — E119 Type 2 diabetes mellitus without complications: Secondary | ICD-10-CM

## 2017-07-30 IMAGING — US US ABDOMEN LIMITED
1 series · 14 of 25 positions shown · non-contrast
Comparison: CT abdomen/ pelvis earlier this day.

CLINICAL DATA: Right upper quadrant abdominal pain.

EXAM:
US ABDOMEN LIMITED - RIGHT UPPER QUADRANT

[Series 1: us abdomen limited · 0.31mm/px · 14 of 45 slices shown]
[im 1/45]
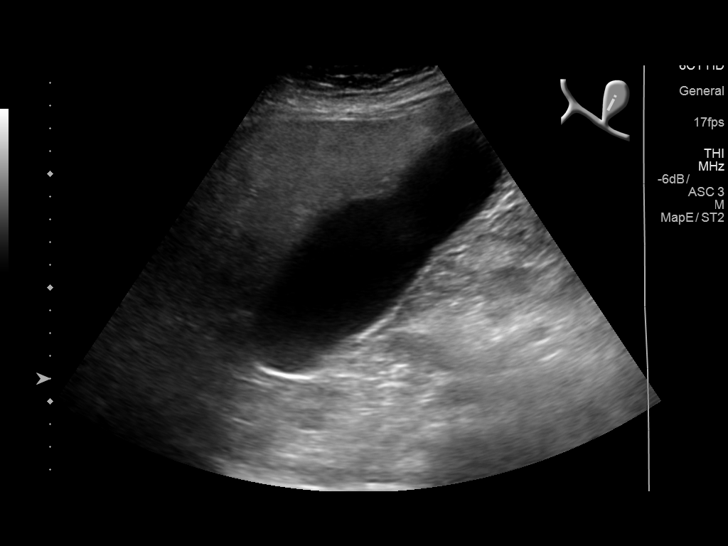
[im 4/45]
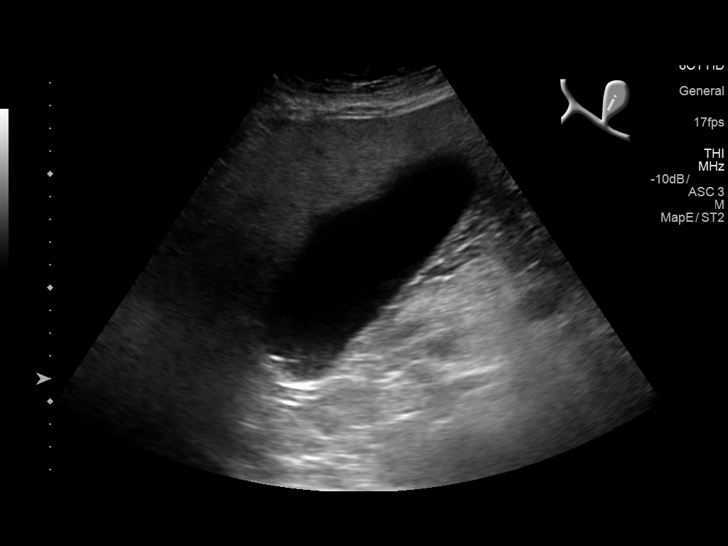
[im 8/45]
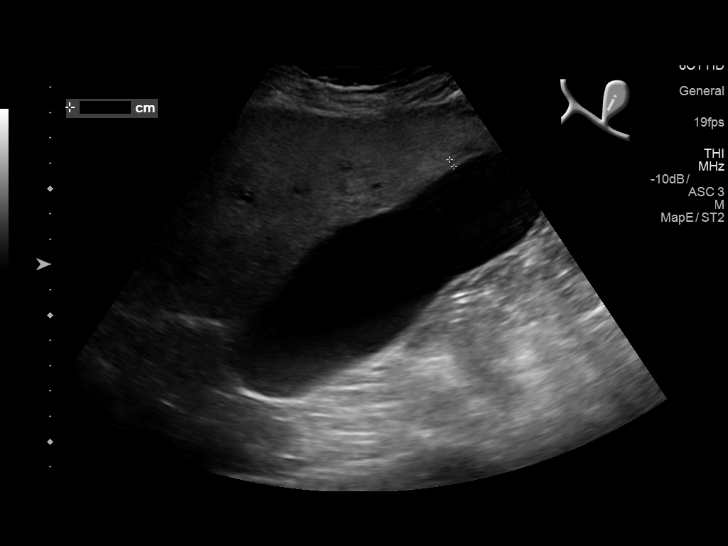
[im 12/45]
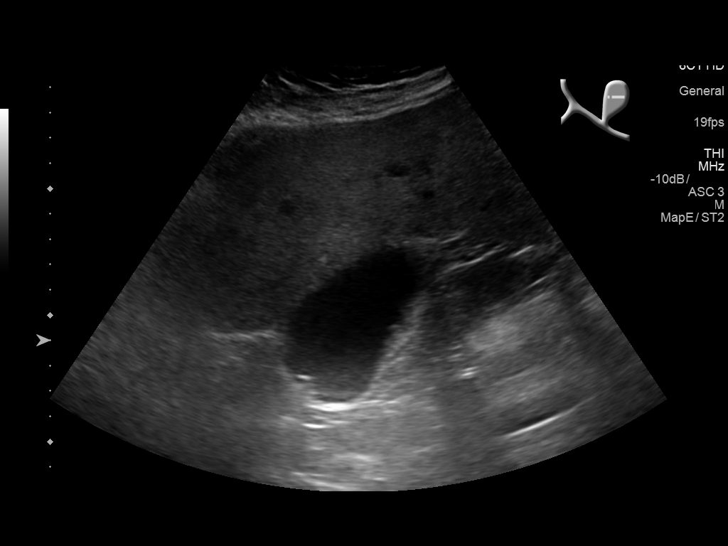
[im 15/45]
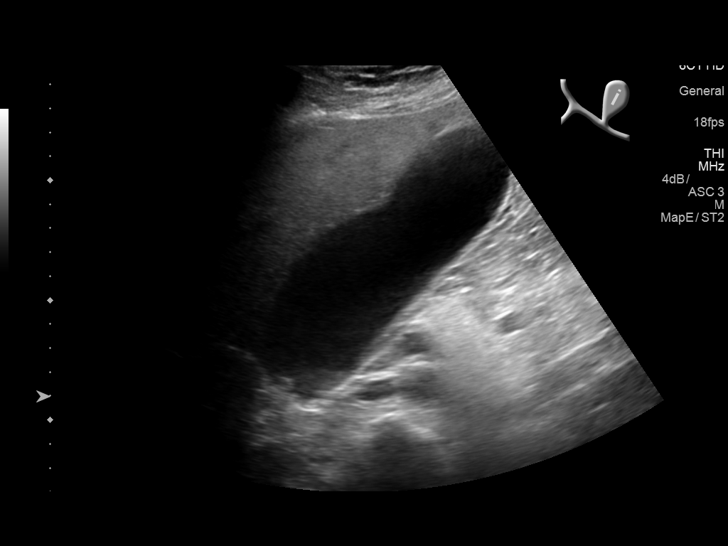
[im 17/45]
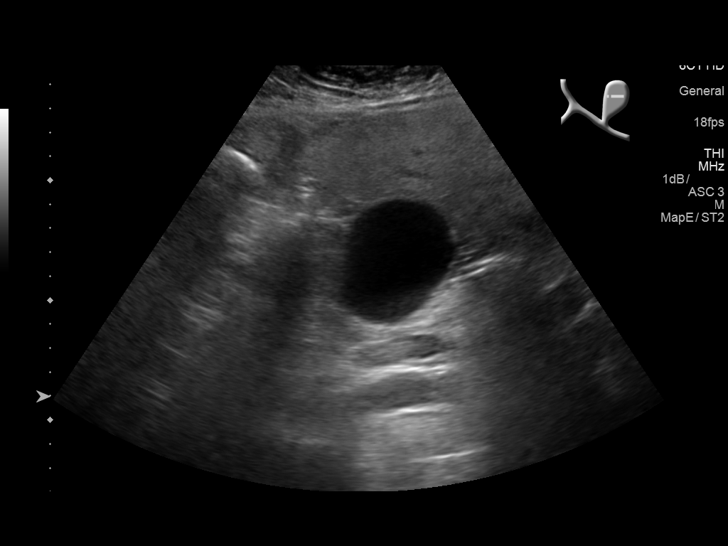
[im 21/45]
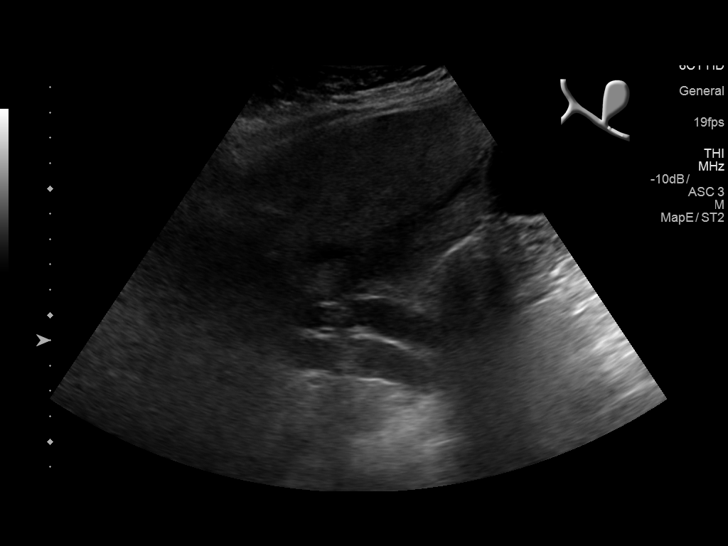
[im 24/45]
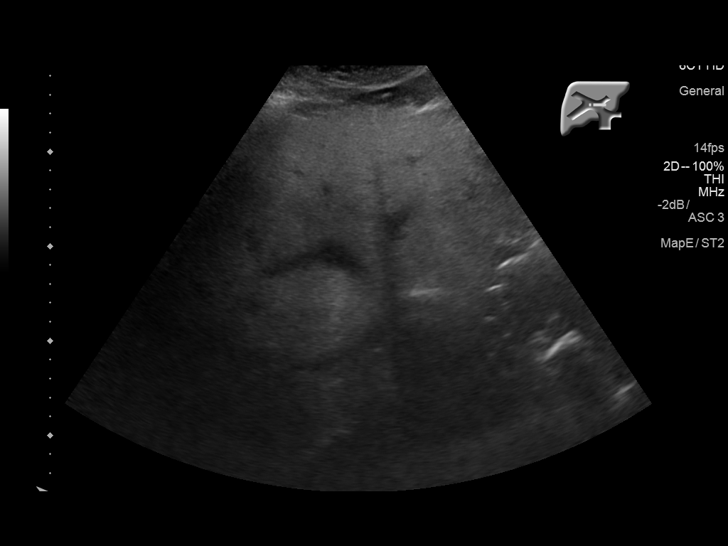
[im 28/45]
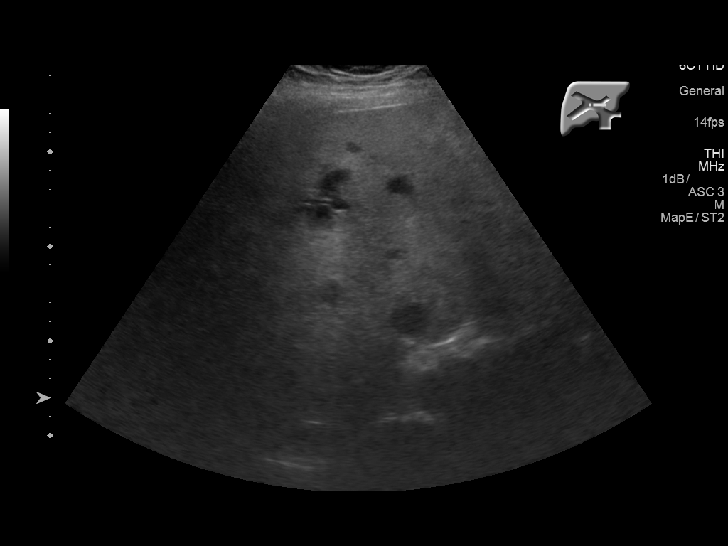
[im 30/45]
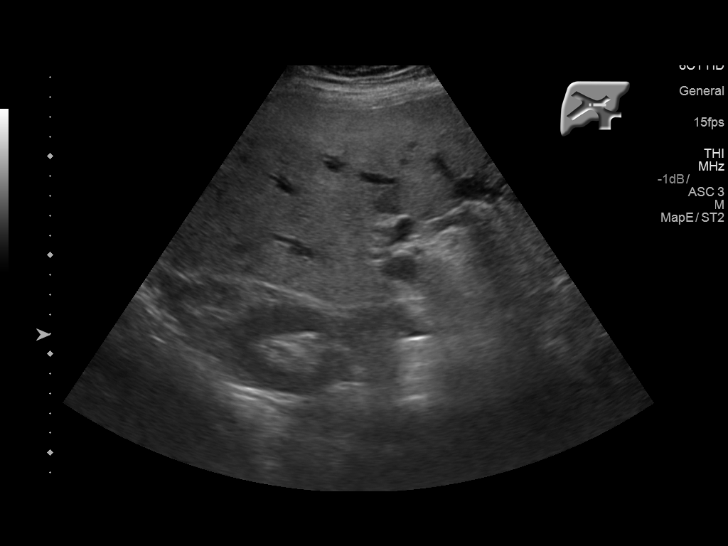
[im 34/45]
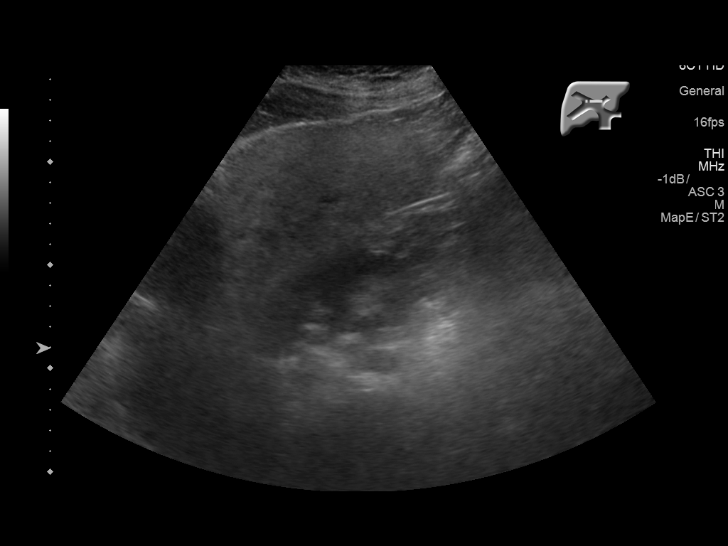
[im 37/45]
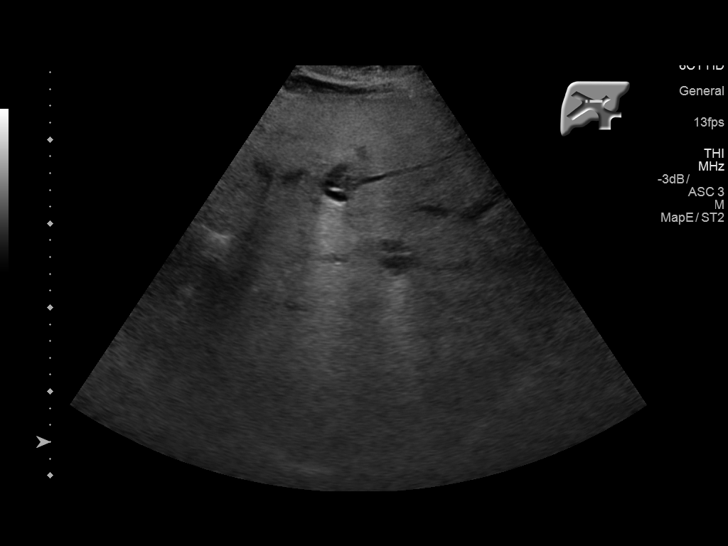
[im 41/45]
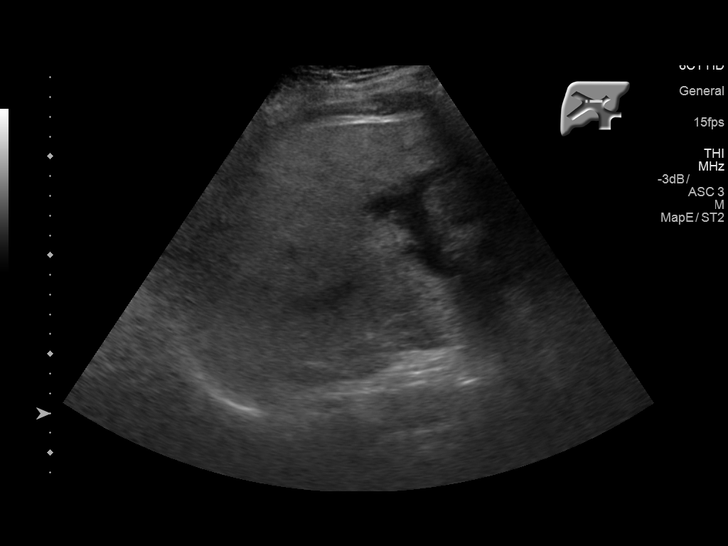
[im 45/45]
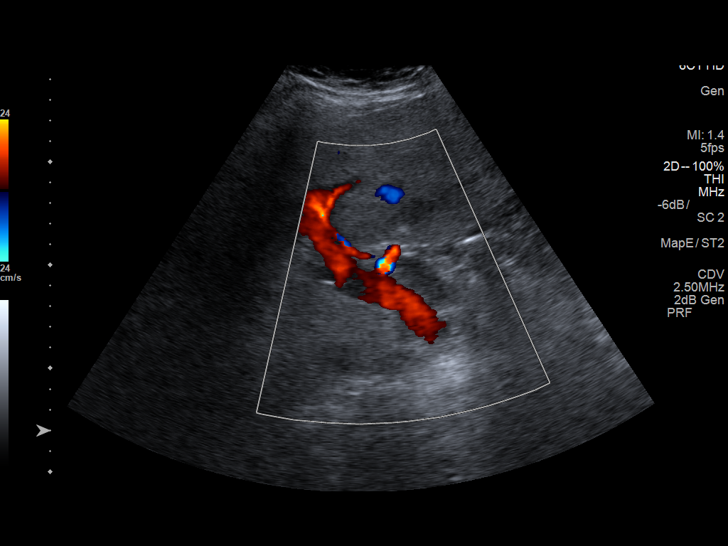

[14 of 25 positions shown; findings below may reference images not displayed]

FINDINGS: Gallbladder:

Gallbladder is distended and contains sludge and small stones.
Borderline gallbladder wall thickness of 3 mm. No sonographic Murphy
sign noted by sonographer. No pericholecystic fluid.

Common bile duct:

Diameter: 12 mm at the porta hepatis. Distal common bile duct is not
well visualized.

Liver:

No focal lesion identified. Diffusely increased in parenchymal
echogenicity. Intrahepatic biliary ductal dilatation, better
appreciated on CT. Normal directional flow in the main portal vein.
IMPRESSION: 1. Stones and sludge within the gallbladder with borderline
gallbladder wall thickening. Negative sonographic Murphy sign.
2. Biliary dilatation, cause not identified sonographically. MRCP
may be of value for further evaluation.
3. Hepatic steatosis.

## 2017-08-16 ENCOUNTER — Ambulatory Visit (INDEPENDENT_AMBULATORY_CARE_PROVIDER_SITE_OTHER): Payer: BLUE CROSS/BLUE SHIELD | Admitting: *Deleted

## 2017-08-16 ENCOUNTER — Other Ambulatory Visit: Payer: Self-pay | Admitting: Family Medicine

## 2017-08-16 DIAGNOSIS — I10 Essential (primary) hypertension: Secondary | ICD-10-CM

## 2017-08-16 DIAGNOSIS — I639 Cerebral infarction, unspecified: Secondary | ICD-10-CM | POA: Diagnosis not present

## 2017-08-16 NOTE — Progress Notes (Signed)
Carelink Summary Report / Loop Recorder 

## 2017-08-24 LAB — CUP PACEART REMOTE DEVICE CHECK
Date Time Interrogation Session: 20181202163708
MDC IDC PG IMPLANT DT: 20180705

## 2017-09-15 ENCOUNTER — Ambulatory Visit (INDEPENDENT_AMBULATORY_CARE_PROVIDER_SITE_OTHER): Payer: BLUE CROSS/BLUE SHIELD | Admitting: *Deleted

## 2017-09-15 DIAGNOSIS — I639 Cerebral infarction, unspecified: Secondary | ICD-10-CM | POA: Diagnosis not present

## 2017-09-16 NOTE — Progress Notes (Signed)
Carelink Summary Report / Loop Recorder 

## 2017-09-20 ENCOUNTER — Encounter: Payer: BLUE CROSS/BLUE SHIELD | Admitting: Family Medicine

## 2017-09-21 ENCOUNTER — Ambulatory Visit: Payer: BLUE CROSS/BLUE SHIELD | Admitting: Family Medicine

## 2017-09-21 ENCOUNTER — Encounter: Payer: Self-pay | Admitting: Family Medicine

## 2017-09-21 VITALS — BP 112/76 | HR 89 | Temp 98.2°F | Resp 16 | Ht 66.0 in | Wt 269.0 lb

## 2017-09-21 DIAGNOSIS — I63411 Cerebral infarction due to embolism of right middle cerebral artery: Secondary | ICD-10-CM | POA: Diagnosis not present

## 2017-09-21 DIAGNOSIS — M1000 Idiopathic gout, unspecified site: Secondary | ICD-10-CM | POA: Diagnosis not present

## 2017-09-21 DIAGNOSIS — I10 Essential (primary) hypertension: Secondary | ICD-10-CM | POA: Diagnosis not present

## 2017-09-21 DIAGNOSIS — G8929 Other chronic pain: Secondary | ICD-10-CM

## 2017-09-21 DIAGNOSIS — M25562 Pain in left knee: Secondary | ICD-10-CM

## 2017-09-21 DIAGNOSIS — M25561 Pain in right knee: Secondary | ICD-10-CM | POA: Diagnosis not present

## 2017-09-21 DIAGNOSIS — E785 Hyperlipidemia, unspecified: Secondary | ICD-10-CM | POA: Diagnosis not present

## 2017-09-21 DIAGNOSIS — E1151 Type 2 diabetes mellitus with diabetic peripheral angiopathy without gangrene: Secondary | ICD-10-CM

## 2017-09-21 DIAGNOSIS — Z23 Encounter for immunization: Secondary | ICD-10-CM | POA: Insufficient documentation

## 2017-09-21 DIAGNOSIS — E782 Mixed hyperlipidemia: Secondary | ICD-10-CM | POA: Diagnosis not present

## 2017-09-21 DIAGNOSIS — IMO0002 Reserved for concepts with insufficient information to code with codable children: Secondary | ICD-10-CM

## 2017-09-21 DIAGNOSIS — E1165 Type 2 diabetes mellitus with hyperglycemia: Secondary | ICD-10-CM

## 2017-09-21 MED ORDER — ALLOPURINOL 300 MG PO TABS: 300.0000 mg | ORAL_TABLET | Freq: Two times a day (BID) | ORAL | 3 refills | Status: DC

## 2017-09-21 MED ORDER — GLUCOSE BLOOD VI STRP: ORAL_STRIP | 1 refills | Status: DC

## 2017-09-21 MED ORDER — VALSARTAN-HYDROCHLOROTHIAZIDE 80-12.5 MG PO TABS: 1.0000 | ORAL_TABLET | Freq: Every day | ORAL | 1 refills | Status: DC

## 2017-09-21 MED ORDER — ROSUVASTATIN CALCIUM 20 MG PO TABS: 20.0000 mg | ORAL_TABLET | Freq: Every day | ORAL | 3 refills | Status: DC

## 2017-09-21 MED ORDER — FENOFIBRATE 160 MG PO TABS: 160.0000 mg | ORAL_TABLET | Freq: Every day | ORAL | 1 refills | Status: DC

## 2017-09-21 MED ORDER — ACCU-CHEK SOFTCLIX LANCETS MISC: 1 refills | Status: DC

## 2017-09-21 MED ORDER — POTASSIUM CHLORIDE CRYS ER 20 MEQ PO TBCR: 20.0000 meq | EXTENDED_RELEASE_TABLET | Freq: Every day | ORAL | 1 refills | Status: DC

## 2017-09-21 MED ORDER — SITAGLIP PHOS-METFORMIN HCL ER 100-1000 MG PO TB24: 1.0000 | ORAL_TABLET | Freq: Every day | ORAL | 1 refills | Status: DC

## 2017-09-21 NOTE — Assessment & Plan Note (Signed)
Still with numbness in L hand  Per neuro

## 2017-09-21 NOTE — Assessment & Plan Note (Signed)
prevnar given

## 2017-09-21 NOTE — Assessment & Plan Note (Signed)
Well controlled, no changes to meds. Encouraged heart healthy diet such as the DASH diet and exercise as tolerated.  °

## 2017-09-21 NOTE — Assessment & Plan Note (Signed)
>>  ASSESSMENT AND PLAN FOR CEREBROVASCULAR ACCIDENT (CVA) DUE TO EMBOLISM OF RIGHT MIDDLE CEREBRAL ARTERY (HCC) WRITTEN ON 09/21/2017  5:55 PM BY LOWNE CHASE, YVONNE R, DO  Still with numbness in L hand  Per neuro

## 2017-09-21 NOTE — Assessment & Plan Note (Signed)
D/w pt diet and exercise Unable to exercise due to knees Gave pt info for healthy weight and wellness

## 2017-09-21 NOTE — Assessment & Plan Note (Addendum)
shingrix given rto 2 -6 months for #2

## 2017-09-21 NOTE — Assessment & Plan Note (Signed)
>>  ASSESSMENT AND PLAN FOR ESSENTIAL HYPERTENSION WRITTEN ON 09/21/2017  5:52 PM BY LOWNE CHASE, YVONNE R, DO  Well controlled, no changes to meds. Encouraged heart healthy diet such as the DASH diet and exercise as tolerated.

## 2017-09-21 NOTE — Assessment & Plan Note (Signed)
>>  ASSESSMENT AND PLAN FOR MIXED HYPERLIPIDEMIA WRITTEN ON 09/21/2017  5:53 PM BY LOWNE CHASE, YVONNE R, DO  Tolerating statin, encouraged heart healthy diet, avoid trans fats, minimize simple carbs and saturated fats. Increase exercise as tolerated

## 2017-09-21 NOTE — Assessment & Plan Note (Signed)
Tolerating statin, encouraged heart healthy diet, avoid trans fats, minimize simple carbs and saturated fats. Increase exercise as tolerated 

## 2017-09-21 NOTE — Patient Instructions (Signed)

## 2017-09-21 NOTE — Progress Notes (Signed)
Patient ID: Rachel Black, female    DOB: [birth year]  Age: 67 y.o. MRN: [id number]    Subjective:  Subjective  HPI Rachel Black presents for f/u htn, dm and cholesterol.   Pt also c/o pain in knees -- she has not seen Dr Ninfa Linden in a while (2016)  HPI HYPERTENSION   Blood pressure range-not checking   Chest pain- no      Dyspnea- no Lightheadedness- no   Edema- some  Other side effects - no   Medication compliance: good Low salt diet- yes     DIABETES    Blood Sugar ranges-116--145  Polyuria- no New Visual problems- no  Hypoglycemic symptoms- no  Other side effects-no Medication compliance - good Last eye exam- due Foot exam- today   HYPERLIPIDEMIA  Medication compliance- good RUQ pain- no  Muscle aches- no Other side effects-no   Review of Systems  Constitutional: Negative for activity change, appetite change, chills, diaphoresis, fatigue, fever and unexpected weight change.  Eyes: Negative for pain, redness and visual disturbance.  Respiratory: Negative for cough, chest tightness, shortness of breath and wheezing.   Cardiovascular: Negative for chest pain, palpitations and leg swelling.  Gastrointestinal: Negative for abdominal distention and abdominal pain.  Endocrine: Negative for cold intolerance, heat intolerance, polydipsia, polyphagia and polyuria.  Genitourinary: Negative for difficulty urinating, dyspareunia, dysuria, flank pain, frequency, genital sores, hematuria, menstrual problem, pelvic pain, urgency, vaginal discharge and vaginal pain.  Musculoskeletal: Positive for arthralgias. Negative for back pain.  Neurological: Negative for dizziness, light-headedness, numbness and headaches.  Psychiatric/Behavioral: Negative for behavioral problems and dysphoric mood. The patient is not nervous/anxious.     History Past Medical History:  Diagnosis Date  . Arthritis    PAIN AND OA BOTH KNEES AND SHOULDERS AND ELBOWS AND WRIST  . Blood transfusion  without reported diagnosis 2018   after cholecystectomy  . Diabetes mellitus    ORAL MEDICATION  . GERD (gastroesophageal reflux disease)   . Gout    NO RECENT FLARE UPS  . H/O: rheumatic fever    AS A CHILD - NO KNOWN HEART MURMUR OR HEART PROBLEMS  . Hyperlipidemia   . Hypertension   . Shortness of breath    WITH EXERTION  . Stroke [facility])     She has a past surgical history that includes Ventral hernia repair; Partial hysterectomy; Cesarean section; Esophagogastroduodenoscopy (egd) with propofol (N/A, 01/30/2013); Colonoscopy with propofol (N/A, 01/30/2013); Abdominal hysterectomy; Total knee arthroplasty (Right, 04/20/2014); Total knee arthroplasty (Left, 08/23/2015); ERCP (N/A, 12/08/2016); Cholecystectomy (N/A, 12/09/2016); Gallstone Surgeery (12/08/2016); Cholecystectomy (12/09/2016); TEE without cardioversion (N/A, 03/16/2017); and LOOP RECORDER INSERTION (N/A, 03/18/2017).   Her family history includes Crohn's disease in her son; Diabetes in her father and mother; Heart disease in her father; Hyperlipidemia in her father; Hypertension in her mother; Irritable bowel syndrome in her son; Stroke in her father.She reports that  has never smoked. she has never used smokeless tobacco. She reports that she does not drink alcohol or use drugs.  Current Outpatient Medications on File Prior to Visit  Medication Sig Dispense Refill  . aspirin 325 MG tablet Take 1 tablet (325 mg total) by mouth daily. 30 tablet 3  . calcium carbonate (TUMS - DOSED IN MG ELEMENTAL CALCIUM) 500 MG chewable tablet Chew 2 tablets by mouth 3 (three) times daily as needed for indigestion or heartburn.    . calcium-vitamin D (OSCAL WITH D) 500-200 MG-UNIT per tablet Take 1 tablet by mouth daily with breakfast.     .  dexlansoprazole (DEXILANT) 60 MG capsule TAKE 1 CAPSULE (60 MG TOTAL) BY MOUTH EVERY MORNING 90 capsule 3  . ferrous sulfate 325 (65 FE) MG EC tablet Take 1 tablet (325 mg total) by mouth 2 (two) times daily. 60  tablet 11  . ranitidine (ZANTAC) 150 MG tablet Take one in the morning and two at night 270 tablet 3  . senna-docusate (SENOKOT-S) 8.6-50 MG tablet Take 1 tablet by mouth at bedtime as needed for mild constipation. 30 tablet 1   No current facility-administered medications on file prior to visit.      Objective:  Objective  Physical Exam  Constitutional: She is oriented to person, place, and time. She appears well-developed and well-nourished.  HENT:  Head: Normocephalic and atraumatic.  Eyes: Conjunctivae and EOM are normal.  Neck: Normal range of motion. Neck supple. No JVD present. Carotid bruit is not present. No thyromegaly present.  Cardiovascular: Normal rate, regular rhythm and normal heart sounds.  No murmur heard. Pulmonary/Chest: Effort normal and breath sounds normal. No respiratory distress. She has no wheezes. She has no rales. She exhibits no tenderness.  Musculoskeletal: She exhibits edema and tenderness.       Right knee: She exhibits decreased range of motion and swelling. Tenderness found. Lateral joint line tenderness noted.       Left knee: She exhibits decreased range of motion and swelling. Tenderness found. Lateral joint line tenderness noted.  Neurological: She is alert and oriented to person, place, and time.  Psychiatric: She has a normal mood and affect.  Nursing note and vitals reviewed. Sensory exam of the foot is normal, tested with the monofilament. Good pulses, no lesions or ulcers, good peripheral pulses.  BP 112/76 (BP Location: Right Wrist, Cuff Size: Normal)   Pulse 89   Temp 98.2 F (36.8 C) (Oral)   Resp 16   Ht 5' 6"  (1.676 m)   Wt 269 lb (122 kg)   SpO2 98%   BMI 43.42 kg/m  Wt Readings from Last 3 Encounters:  09/21/17 269 lb (122 kg)  07/06/17 266 lb 6.4 oz (120.8 kg)  06/29/17 266 lb (120.7 kg)     Lab Results  Component Value Date   WBC 6.3 07/06/2017   HGB 12.5 07/06/2017   HCT 38.2 07/06/2017   PLT 108.0 (L) 07/06/2017    GLUCOSE 137 (H) 04/15/2017   CHOL 125 03/15/2017   TRIG 284 (H) 03/15/2017   HDL 23 (L) 03/15/2017   LDLDIRECT 37.0 12/18/2016   LDLCALC 45 03/15/2017   ALT 20 04/15/2017   AST 29 04/15/2017   NA 139 04/15/2017   K 4.1 04/15/2017   CL 107 04/15/2017   CREATININE 1.22 (H) 04/15/2017   BUN 19 04/15/2017   CO2 26 04/15/2017   TSH 3.59 12/18/2016   INR 0.96 03/14/2017   HGBA1C 5.4 03/15/2017   MICROALBUR <0.7 11/20/2014    Ct Head Wo Contrast  Result Date: 03/14/2017 CLINICAL DATA:  LEFT arm weakness and numbness 2130 hrs last night, unable to hold anything in LEFT hand, keeps dropping everything in LEFT hand, history hypertension, diabetes mellitus EXAM: CT HEAD WITHOUT CONTRAST TECHNIQUE: Contiguous axial images were obtained from the base of the skull through the vertex without intravenous contrast. Sagittal and coronal MPR images reconstructed from axial data set. COMPARISON:  None FINDINGS: Brain: Normal ventricular morphology. No midline shift or mass effect. Small area of slightly decreased attenuation in the RIGHT parietal lobe consistent with acute infarct. No intracranial hemorrhage or mass  lesion. No additional areas of infarction identified. Posterior fossa unremarkable. No extra-axial fluid collections. Vascular: Atherosclerotic calcification of internal carotid arteries bilaterally at skullbase. Skull: Intact Sinuses/Orbits: Clear Other: N/A IMPRESSION: Acute nonhemorrhagic cortical infarct at high RIGHT parietal lobe. Electronically Signed   By: Lavonia Dana M.D.   On: 03/14/2017 07:02   Ct Angio Neck W Or Wo Contrast  Result Date: 03/15/2017 CLINICAL DATA:  Stroke with left arm numbness. EXAM: CT ANGIOGRAPHY NECK TECHNIQUE: Multidetector CT imaging of the neck was performed using the standard protocol during bolus administration of intravenous contrast. Multiplanar CT image reconstructions and MIPs were obtained to evaluate the vascular anatomy. Carotid stenosis measurements (when  applicable) are obtained utilizing NASCET criteria, using the distal internal carotid diameter as the denominator. CONTRAST:  50 cc Isovue 370 intravenous COMPARISON:  None. FINDINGS: Aortic arch: Normal appearance of the arch.  Three vessel branching. Right carotid system: Smooth and widely patent. Common carotid tortuosity with partial retropharyngeal course. Negative for beading. Left carotid system: Smooth and widely patent. Common carotid tortuosity with partial retropharyngeal course. Negative for beading. Vertebral arteries: No proximal subclavian stenosis. Both vertebral arteries are smooth and widely patent to the dura. Skeleton: No acute or aggressive finding. Facet arthropathy with slight anterolisthesis at C3-4. Other neck: No incidental mass or inflammation. Upper chest: No acute finding. IMPRESSION: Negative CTA of the neck. No stenosis, atherosclerosis, or embolic source. Electronically Signed   By: Monte Fantasia M.D.   On: 03/15/2017 11:34   Mr Brain Wo Contrast  Result Date: 03/14/2017 CLINICAL DATA:  67 year old female with acute onset left arm weakness, slurred speech, left facial droop. EXAM: MRI HEAD WITHOUT CONTRAST MRA HEAD WITHOUT CONTRAST TECHNIQUE: Multiplanar, multiecho pulse sequences of the brain and surrounding structures were obtained without intravenous contrast. Angiographic images of the head were obtained using MRA technique without contrast. COMPARISON:  Head CT 0634 hours today. FINDINGS: MRI HEAD FINDINGS Brain: Restricted diffusion along the lateral right motor strip tracking to the right frontal operculum (series 9, image 17). Associated gyral edema. No associated hemorrhage or mass effect. No other restricted diffusion. No midline shift, mass effect, evidence of mass lesion, ventriculomegaly, extra-axial collection or acute intracranial hemorrhage. Cervicomedullary junction and pituitary are within normal limits. There is mild for age scattered nonspecific cerebral white  matter T2 and FLAIR hyperintensity. There are several subtle chronic lacunar infarcts in the cerebellum (series 8, image 8). And there is mild nonspecific T2 heterogeneity in both globus pallidus. No cortical encephalomalacia or chronic cerebral blood products identified. Vascular: Major intracranial vascular flow voids are preserved. Skull and upper cervical spine: Negative. Visualized bone marrow signal is within normal limits. Sinuses/Orbits: Normal orbit soft tissues. Visualized paranasal sinuses and mastoids are stable and well pneumatized. Other: Visible internal auditory structures appear normal. Negative scalp soft tissues. MRA HEAD FINDINGS Antegrade flow in the posterior circulation with codominant distal vertebral arteries. No distal vertebral stenosis. Normal basilar artery. AICA, SCA, and PCA origins are normal (the left PCA origin is fetal type). The right posterior communicating artery is diminutive. Bilateral PCA branches are within normal limits. Antegrade flow in both ICA siphons. No siphon stenosis. Mildly tortuous cavernous ICAs. Ophthalmic and posterior communicating artery origins are normal. Patent carotid termini. Normal MCA and ACA origins. Anterior communicating artery and visible ACA branches are within normal limits. Left MCA branches are within normal limits. Right MCA M1 segment, right MCA bifurcation, and visible right MCA branches are within normal limits. No right MCA branch occlusion identified.  IMPRESSION: 1. Acute infarct along the right lateral motor strip corresponding to the noncontrast CT finding earlier today. Gyral cytotoxic edema but no associated hemorrhage or mass effect. 2.  Negative intracranial MRA. 3. Evidence of mild for age underlying small vessel disease. Electronically Signed   By: Genevie Ann M.D.   On: 03/14/2017 21:13   Mr Jodene Nam Head/brain JO Cm  Result Date: 03/14/2017 CLINICAL DATA:  67 year old female with acute onset left arm weakness, slurred speech, left  facial droop. EXAM: MRI HEAD WITHOUT CONTRAST MRA HEAD WITHOUT CONTRAST TECHNIQUE: Multiplanar, multiecho pulse sequences of the brain and surrounding structures were obtained without intravenous contrast. Angiographic images of the head were obtained using MRA technique without contrast. COMPARISON:  Head CT 0634 hours today. FINDINGS: MRI HEAD FINDINGS Brain: Restricted diffusion along the lateral right motor strip tracking to the right frontal operculum (series 9, image 17). Associated gyral edema. No associated hemorrhage or mass effect. No other restricted diffusion. No midline shift, mass effect, evidence of mass lesion, ventriculomegaly, extra-axial collection or acute intracranial hemorrhage. Cervicomedullary junction and pituitary are within normal limits. There is mild for age scattered nonspecific cerebral white matter T2 and FLAIR hyperintensity. There are several subtle chronic lacunar infarcts in the cerebellum (series 8, image 8). And there is mild nonspecific T2 heterogeneity in both globus pallidus. No cortical encephalomalacia or chronic cerebral blood products identified. Vascular: Major intracranial vascular flow voids are preserved. Skull and upper cervical spine: Negative. Visualized bone marrow signal is within normal limits. Sinuses/Orbits: Normal orbit soft tissues. Visualized paranasal sinuses and mastoids are stable and well pneumatized. Other: Visible internal auditory structures appear normal. Negative scalp soft tissues. MRA HEAD FINDINGS Antegrade flow in the posterior circulation with codominant distal vertebral arteries. No distal vertebral stenosis. Normal basilar artery. AICA, SCA, and PCA origins are normal (the left PCA origin is fetal type). The right posterior communicating artery is diminutive. Bilateral PCA branches are within normal limits. Antegrade flow in both ICA siphons. No siphon stenosis. Mildly tortuous cavernous ICAs. Ophthalmic and posterior communicating artery  origins are normal. Patent carotid termini. Normal MCA and ACA origins. Anterior communicating artery and visible ACA branches are within normal limits. Left MCA branches are within normal limits. Right MCA M1 segment, right MCA bifurcation, and visible right MCA branches are within normal limits. No right MCA branch occlusion identified. IMPRESSION: 1. Acute infarct along the right lateral motor strip corresponding to the noncontrast CT finding earlier today. Gyral cytotoxic edema but no associated hemorrhage or mass effect. 2.  Negative intracranial MRA. 3. Evidence of mild for age underlying small vessel disease. Electronically Signed   By: Genevie Ann M.D.   On: 03/14/2017 21:13     Assessment & Plan:  Plan  I have discontinued Rachel Black. Rachel Black, valsartan-hydrochlorothiazide, allopurinol, ONE TOUCH ULTRA TEST, and valsartan-hydrochlorothiazide. I have changed her JANUMET XR to SitaGLIPtin-MetFORMIN HCl. I have also changed her allopurinol, fenofibrate, and valsartan-hydrochlorothiazide. Additionally, I am having her start on glucose blood and ACCU-CHEK SOFTCLIX LANCETS. Lastly, I am having her maintain her calcium-vitamin D, calcium carbonate, senna-docusate, aspirin, ferrous sulfate, dexlansoprazole, ranitidine, potassium chloride SA, and rosuvastatin.  Meds ordered this encounter  Medications  . glucose blood (ACCU-CHEK AVIVA PLUS) test strip    Sig: Use as instructed once a day.  DX Code E11.9    Dispense:  100 each    Refill:  1  . ACCU-CHEK SOFTCLIX LANCETS lancets    Sig: Use as instructed once a  day.  DX Code: E11.9    Dispense:  100 each    Refill:  1  . allopurinol (ZYLOPRIM) 300 MG tablet    Sig: Take 1 tablet (300 mg total) by mouth 2 (two) times daily.    Dispense:  180 tablet    Refill:  3  . fenofibrate 160 MG tablet    Sig: Take 1 tablet (160 mg total) by mouth daily.    Dispense:  90 tablet    Refill:  1  . SitaGLIPtin-MetFORMIN HCl (JANUMET XR)  234 [id number] MG TB24    Sig: Take 1 tablet by mouth daily.    Dispense:  90 tablet    Refill:  1  . potassium chloride SA (K-DUR,KLOR-CON) 20 MEQ tablet    Sig: Take 1 tablet (20 mEq total) by mouth daily.    Dispense:  90 tablet    Refill:  1  . rosuvastatin (CRESTOR) 20 MG tablet    Sig: Take 1 tablet (20 mg total) by mouth daily.    Dispense:  90 tablet    Refill:  3  . valsartan-hydrochlorothiazide (DIOVAN-HCT) 80-12.5 MG tablet    Sig: Take 1 tablet by mouth daily.    Dispense:  90 tablet    Refill:  1    Problem List Items Addressed This Visit      Unprioritized   Cerebrovascular accident (CVA) due to embolism of right middle cerebral artery (Rachel Black)    Still with numbness in L hand  Per neuro      Relevant Medications   fenofibrate 160 MG tablet   rosuvastatin (CRESTOR) 20 MG tablet   valsartan-hydrochlorothiazide (DIOVAN-HCT) 80-12.5 MG tablet   Essential hypertension (Chronic)    Well controlled, no changes to meds. Encouraged heart healthy diet such as the DASH diet and exercise as tolerated.       Relevant Medications   ACCU-CHEK SOFTCLIX LANCETS lancets   fenofibrate 160 MG tablet   potassium chloride SA (K-DUR,KLOR-CON) 20 MEQ tablet   rosuvastatin (CRESTOR) 20 MG tablet   valsartan-hydrochlorothiazide (DIOVAN-HCT) 80-12.5 MG tablet   Other Relevant Orders   Hemoglobin A1c   Comprehensive metabolic panel   Lipid panel   GOUT   Relevant Medications   allopurinol (ZYLOPRIM) 300 MG tablet   Other Relevant Orders   Uric acid   Mixed hyperlipidemia    Tolerating statin, encouraged heart healthy diet, avoid trans fats, minimize simple carbs and saturated fats. Increase exercise as tolerated      Relevant Medications   ACCU-CHEK SOFTCLIX LANCETS lancets   fenofibrate 160 MG tablet   rosuvastatin (CRESTOR) 20 MG tablet   valsartan-hydrochlorothiazide (DIOVAN-HCT) 80-12.5 MG tablet   Severe obesity (BMI >= 40) (HCC)    D/w pt diet and exercise Unable to  exercise due to knees Gave pt info for healthy weight and wellness      Relevant Medications   SitaGLIPtin-MetFORMIN HCl (JANUMET XR) [id number] MG TB24    Other Visit Diagnoses    Hyperlipidemia LDL goal <70    -  Primary   Relevant Medications   ACCU-CHEK SOFTCLIX LANCETS lancets   fenofibrate 160 MG tablet   rosuvastatin (CRESTOR) 20 MG tablet   valsartan-hydrochlorothiazide (DIOVAN-HCT) 80-12.5 MG tablet   Other Relevant Orders   Hemoglobin A1c   Comprehensive metabolic panel   Lipid panel   DM (diabetes mellitus) type II uncontrolled, periph vascular disorder (HCC)       Relevant Medications   glucose blood (ACCU-CHEK AVIVA PLUS) test strip  ACCU-CHEK SOFTCLIX LANCETS lancets   fenofibrate 160 MG tablet   SitaGLIPtin-MetFORMIN HCl (JANUMET XR) 304 [id number] [id number] MG TB24   rosuvastatin (CRESTOR) 20 MG tablet   valsartan-hydrochlorothiazide (DIOVAN-HCT) 80-12.5 MG tablet   Chronic pain of both knees       Relevant Orders   Ambulatory referral to Orthopedic Surgery   Hyperlipidemia       Relevant Medications   fenofibrate 160 MG tablet   rosuvastatin (CRESTOR) 20 MG tablet   valsartan-hydrochlorothiazide (DIOVAN-HCT) 80-12.5 MG tablet      Follow-up: Return in about 6 months (around 03/21/2018), or if symptoms worsen or fail to improve, for annual exam, fasting.  Ann Held, DO

## 2017-09-22 LAB — COMPREHENSIVE METABOLIC PANEL
ALBUMIN: 4.4 g/dL (ref 3.5–5.2)
ALK PHOS: 33 U/L — AB (ref 39–117)
ALT: 20 U/L (ref 0–35)
AST: 27 U/L (ref 0–37)
BUN: 19 mg/dL (ref 6–23)
CHLORIDE: 104 meq/L (ref 96–112)
CO2: 27 mEq/L (ref 19–32)
Calcium: 10.3 mg/dL (ref 8.4–10.5)
Creatinine, Ser: 1.34 mg/dL — ABNORMAL HIGH (ref 0.40–1.20)
GFR: 42.15 mL/min — AB (ref >60.00)
GLUCOSE: 125 mg/dL — AB (ref 70–99)
POTASSIUM: 4.9 meq/L (ref 3.5–5.1)
SODIUM: 141 meq/L (ref 135–145)
Total Bilirubin: 0.4 mg/dL (ref 0.2–1.2)
Total Protein: 6.5 g/dL (ref 6.0–8.3)

## 2017-09-22 LAB — LIPID PANEL
CHOL/HDL RATIO: 5
CHOLESTEROL: 138 mg/dL (ref 0–200)
HDL: 30.6 mg/dL — AB (ref >39.00)
NonHDL: 107.26
Triglycerides: 305 mg/dL — ABNORMAL HIGH (ref 0.0–149.0)
VLDL: 61 mg/dL — AB (ref 0.0–40.0)

## 2017-09-22 LAB — LDL CHOLESTEROL, DIRECT: Direct LDL: 68 mg/dL

## 2017-09-22 LAB — URIC ACID: URIC ACID, SERUM: 3.4 mg/dL (ref 2.4–7.0)

## 2017-09-22 LAB — HEMOGLOBIN A1C: HEMOGLOBIN A1C: 5.9 % (ref 4.6–6.5)

## 2017-09-27 ENCOUNTER — Other Ambulatory Visit: Payer: Self-pay

## 2017-09-27 MED ORDER — ROSUVASTATIN CALCIUM 40 MG PO TABS: 40.0000 mg | ORAL_TABLET | Freq: Every day | ORAL | 0 refills | Status: DC

## 2017-09-28 LAB — CUP PACEART REMOTE DEVICE CHECK
Date Time Interrogation Session: 20190101164036
Implantable Pulse Generator Implant Date: 20180705

## 2017-10-06 ENCOUNTER — Ambulatory Visit (INDEPENDENT_AMBULATORY_CARE_PROVIDER_SITE_OTHER): Payer: Medicare Other

## 2017-10-06 ENCOUNTER — Encounter (INDEPENDENT_AMBULATORY_CARE_PROVIDER_SITE_OTHER): Payer: Self-pay | Admitting: Orthopaedic Surgery

## 2017-10-06 ENCOUNTER — Ambulatory Visit (INDEPENDENT_AMBULATORY_CARE_PROVIDER_SITE_OTHER): Payer: BLUE CROSS/BLUE SHIELD | Admitting: Orthopaedic Surgery

## 2017-10-06 DIAGNOSIS — M25561 Pain in right knee: Secondary | ICD-10-CM

## 2017-10-06 DIAGNOSIS — Z96651 Presence of right artificial knee joint: Secondary | ICD-10-CM

## 2017-10-06 DIAGNOSIS — Z96652 Presence of left artificial knee joint: Secondary | ICD-10-CM

## 2017-10-06 DIAGNOSIS — M25562 Pain in left knee: Secondary | ICD-10-CM | POA: Diagnosis not present

## 2017-10-06 NOTE — Progress Notes (Signed)
Office Visit Note   Patient: Rachel Black           Date of Birth: 24-Jan-1951           MRN: [id number] Visit Date: 10/06/2017              Requested by: [address] [place], [place] [address] [place], [place] [address] PCP: Carollee Herter, Alferd Apa, DO   Assessment & Plan: Visit Diagnoses:  1. Pain in both knees, unspecified chronicity   2. Status post total left knee replacement   3. Status post total right knee replacement     Plan: I gave her reassurance that her clinical exam however appears good on her knees and her x-rays look good.  However I do feel that with her severe deconditioning and her weak quads that she would benefit from outpatient physical therapy and this needs to be extensive for her.  She says that she is just not comfortable going any type of therapy and can try exercises on her own.  We talked in detail at length about this and I told her that she can call at any time to get set up for physical therapy because that would be my next step for her.  All questions concerns were answered and addressed.  She will otherwise follow-up as needed.  Follow-Up Instructions: Return if symptoms worsen or fail to improve.   Orders:  Orders Placed This Encounter  Procedures  . XR Knee 1-2 Views Left  . XR Knee 1-2 Views Right   No orders of the defined types were placed in this encounter.     Procedures: No procedures performed   Clinical Data: No additional findings.   Subjective: Chief Complaint  Patient presents with  . Left Knee - Pain  . Right Knee - Pain  Patient is well-known to me.  She is a morbidly obese 67 year old who had a left total knee arthroplasty in December 2016 and a right total knee arthroplasty in August 2015.  She is had a hard time recovering from these and actually had a stroke in July of this past year.  She is recovering from that.  She says the knees hurt and have popping and grinding it is painful to  walk.  HPI  Review of Systems She currently denies any headache, chest pain, shortness of breath, fever, chills, nausea, vomiting.  Objective: Vital Signs: There were no vitals taken for this visit.  Physical Exam She is alert and oriented x3 and in no acute distress. Ortho Exam Examination of both knees show well-healed surgical incisions.  There is no effusion of either knee.  Both knees are ligamentously stable and have excellent and full range of motion. Specialty Comments:  No specialty comments available.  Imaging: Xr Knee 1-2 Views Left  Result Date: 10/06/2017 2 views of the left knee show a total knee arthroplasty with no complicating features.  There is no effusion and no acute findings.  There is no evidence of ostial lysis or loosening of the components.  Xr Knee 1-2 Views Right  Result Date: 10/06/2017 2 views of the right knee show a total knee arthroplasty with no complicating features.  There is no knee joint effusion and no acute findings.  There is no evidence of ostial lysis or loosening.    PMFS History: Patient Active Problem List   Diagnosis Date Noted  . Need for shingles vaccine 09/21/2017  . Need for pneumococcal vaccine  09/21/2017  . History of rheumatic fever 06/30/2017  . PFO (patent foramen ovale)   . Cerebrovascular accident (CVA) due to embolism of right middle cerebral artery ([place]) 03/14/2017  . Dilation of biliary tract   . Elevated LFTs   . RUQ abdominal pain   . Acute gallstone pancreatitis 12/07/2016  . Choledocholithiasis with obstruction 12/07/2016  . AKI (acute kidney injury) ([place]) 12/07/2016  . Osteoarthritis of right knee 08/23/2015  . Status post total left knee replacement 08/23/2015  . Arthritis of knee, right 04/20/2014  . Morbid obesity ([place]) 04/20/2014  . Status post total right knee replacement 04/20/2014  . Flushing reaction 03/10/2012  . Diabetes mellitus ([place]) 02/17/2007  . Mixed hyperlipidemia 02/17/2007  . GOUT  02/17/2007  . Severe obesity (BMI >= 40) ([place]) 02/17/2007  . Essential hypertension 02/17/2007  . RHEUMATIC FEVER, HX OF 02/17/2007   Past Medical History:  Diagnosis Date  . Arthritis    PAIN AND OA BOTH KNEES AND SHOULDERS AND ELBOWS AND WRIST  . Blood transfusion without reported diagnosis 2018   after cholecystectomy  . Diabetes mellitus    ORAL MEDICATION  . GERD (gastroesophageal reflux disease)   . Gout    NO RECENT FLARE UPS  . H/O: rheumatic fever    AS A CHILD - NO KNOWN HEART MURMUR OR HEART PROBLEMS  . Hyperlipidemia   . Hypertension   . Shortness of breath    WITH EXERTION  . Stroke [facility])     Family History  Problem Relation Age of Onset  . Diabetes Mother   . Hypertension Mother        entire family  . Stroke Father        CVA  . Hyperlipidemia Father        entire family  . Diabetes Father   . Heart disease Father   . Crohn's disease Son   . Irritable bowel syndrome Son     Past Surgical History:  Procedure Laterality Date  . ABDOMINAL HYSTERECTOMY    . CESAREAN SECTION    . CHOLECYSTECTOMY N/A 12/09/2016   Procedure: LAPAROSCOPIC CHOLECYSTECTOMY WITH INTRAOPERATIVE CHOLANGIOGRAM;  Surgeon: Stark Klein, MD;  Location: [facility];  Service: General;  Laterality: N/A;  . CHOLECYSTECTOMY  12/09/2016  . COLONOSCOPY WITH PROPOFOL N/A 01/30/2013   Procedure: COLONOSCOPY WITH PROPOFOL;  Surgeon: Irene Shipper, MD;  [facility];  Service: Endoscopy;  Laterality: N/A;  . ERCP N/A 12/08/2016   Procedure: ENDOSCOPIC RETROGRADE CHOLANGIOPANCREATOGRAPHY (ERCP);  Surgeon: Ladene Artist, MD;  Location: [facility];  Service: Endoscopy;  Laterality: N/A;  . ESOPHAGOGASTRODUODENOSCOPY (EGD) WITH PROPOFOL N/A 01/30/2013   Procedure: ESOPHAGOGASTRODUODENOSCOPY (EGD) WITH PROPOFOL;  Surgeon: Irene Shipper, MD;  [facility];  Service: Endoscopy;  Laterality: N/A;  . Gallstone Surgeery  12/08/2016  . LOOP RECORDER INSERTION N/A 03/18/2017   Procedure: Loop  Recorder Insertion;  Surgeon: Constance Haw, MD;  Location: [facility];  Service: Cardiovascular;  Laterality: N/A;  . PARTIAL HYSTERECTOMY    . TEE WITHOUT CARDIOVERSION N/A 03/16/2017   Procedure: TRANSESOPHAGEAL ECHOCARDIOGRAM (TEE);  Surgeon: Acie Fredrickson Wonda Cheng, MD;  Location: [facility];  Service: Cardiovascular;  Laterality: N/A;  . TOTAL KNEE ARTHROPLASTY Right 04/20/2014   Procedure: RIGHT TOTAL KNEE ARTHROPLASTY CONVERTED TO RIGHT KNEE REIMPLANTATION;  Surgeon: Mcarthur Rossetti, MD;  [facility];  Service: Orthopedics;  Laterality: Right;  . TOTAL KNEE ARTHROPLASTY Left 08/23/2015   Procedure: LEFT TOTAL KNEE ARTHROPLASTY;  Surgeon: Lind Guest  Ninfa Linden, MD;  [facility];  Service: Orthopedics;  Laterality: Left;  Marland Kitchen VENTRAL HERNIA REPAIR     x2   Social History   Occupational History  . Occupation: housewife    Employer: UNEMPLOYED  Tobacco Use  . Smoking status: Never Smoker  . Smokeless tobacco: Never Used  Substance and Sexual Activity  . Alcohol use: No  . Drug use: No  . Sexual activity: Yes    Partners: Male

## 2017-10-11 ENCOUNTER — Telehealth: Payer: Self-pay | Admitting: *Deleted

## 2017-10-11 NOTE — Telephone Encounter (Signed)
Pharmacy messed up and gave patient only one bottle.  They gave her the rest of the medication

## 2017-10-11 NOTE — Telephone Encounter (Signed)
Copied from [facility] [id number]. Topic: General - Other >> Oct 08, 2017  9:09 AM Yvette Rack wrote: Reason for CRM: Patient calling stating that the pharmacy want refill her RX SitaGLIPtin-MetFORMIN HCl (JANUMET XR) [phone] MG TB24  Until Feb 24,2019 because they state that they had just refilled this for the patient in Dec 2018 patient states that she has looked everywhere and that she doesn't have a prescription for that in December she states that she only have 3 pills left until Feb 24th

## 2017-10-14 ENCOUNTER — Ambulatory Visit (INDEPENDENT_AMBULATORY_CARE_PROVIDER_SITE_OTHER): Payer: BLUE CROSS/BLUE SHIELD | Admitting: *Deleted

## 2017-10-14 DIAGNOSIS — I639 Cerebral infarction, unspecified: Secondary | ICD-10-CM

## 2017-10-15 NOTE — Progress Notes (Signed)
Carelink Summary Report / Loop Recorder 

## 2017-10-16 ENCOUNTER — Encounter (HOSPITAL_COMMUNITY): Payer: Self-pay

## 2017-10-16 ENCOUNTER — Emergency Department (HOSPITAL_COMMUNITY): Payer: BLUE CROSS/BLUE SHIELD

## 2017-10-16 ENCOUNTER — Inpatient Hospital Stay (HOSPITAL_COMMUNITY)
Admission: EM | Admit: 2017-10-16 | Discharge: 2017-10-18 | DRG: 281 | Disposition: A | Discharge status: Home / Self Care | Payer: BLUE CROSS/BLUE SHIELD | Attending: Cardiology | Admitting: Cardiology

## 2017-10-16 ENCOUNTER — Other Ambulatory Visit: Payer: Self-pay

## 2017-10-16 DIAGNOSIS — I2510 Atherosclerotic heart disease of native coronary artery without angina pectoris: Secondary | ICD-10-CM | POA: Diagnosis not present

## 2017-10-16 DIAGNOSIS — I129 Hypertensive chronic kidney disease with stage 1 through stage 4 chronic kidney disease, or unspecified chronic kidney disease: Secondary | ICD-10-CM | POA: Diagnosis present

## 2017-10-16 DIAGNOSIS — R079 Chest pain, unspecified: Secondary | ICD-10-CM | POA: Diagnosis not present

## 2017-10-16 DIAGNOSIS — E1122 Type 2 diabetes mellitus with diabetic chronic kidney disease: Secondary | ICD-10-CM | POA: Diagnosis not present

## 2017-10-16 DIAGNOSIS — N183 Chronic kidney disease, stage 3 (moderate): Secondary | ICD-10-CM | POA: Diagnosis present

## 2017-10-16 DIAGNOSIS — E1169 Type 2 diabetes mellitus with other specified complication: Secondary | ICD-10-CM

## 2017-10-16 DIAGNOSIS — I214 Non-ST elevation (NSTEMI) myocardial infarction: Principal | ICD-10-CM

## 2017-10-16 DIAGNOSIS — K219 Gastro-esophageal reflux disease without esophagitis: Secondary | ICD-10-CM | POA: Diagnosis present

## 2017-10-16 DIAGNOSIS — Z90710 Acquired absence of both cervix and uterus: Secondary | ICD-10-CM

## 2017-10-16 DIAGNOSIS — I472 Ventricular tachycardia: Secondary | ICD-10-CM | POA: Diagnosis present

## 2017-10-16 DIAGNOSIS — I639 Cerebral infarction, unspecified: Secondary | ICD-10-CM | POA: Diagnosis not present

## 2017-10-16 DIAGNOSIS — I208 Other forms of angina pectoris: Secondary | ICD-10-CM | POA: Diagnosis not present

## 2017-10-16 DIAGNOSIS — E782 Mixed hyperlipidemia: Secondary | ICD-10-CM | POA: Diagnosis present

## 2017-10-16 DIAGNOSIS — Z8673 Personal history of transient ischemic attack (TIA), and cerebral infarction without residual deficits: Secondary | ICD-10-CM | POA: Diagnosis not present

## 2017-10-16 DIAGNOSIS — Z6841 Body Mass Index (BMI) 40.0 and over, adult: Secondary | ICD-10-CM

## 2017-10-16 DIAGNOSIS — I120 Hypertensive chronic kidney disease with stage 5 chronic kidney disease or end stage renal disease: Secondary | ICD-10-CM | POA: Diagnosis not present

## 2017-10-16 DIAGNOSIS — E118 Type 2 diabetes mellitus with unspecified complications: Secondary | ICD-10-CM | POA: Diagnosis not present

## 2017-10-16 DIAGNOSIS — I10 Essential (primary) hypertension: Secondary | ICD-10-CM | POA: Diagnosis present

## 2017-10-16 DIAGNOSIS — Z96653 Presence of artificial knee joint, bilateral: Secondary | ICD-10-CM | POA: Diagnosis not present

## 2017-10-16 DIAGNOSIS — I200 Unstable angina: Secondary | ICD-10-CM | POA: Diagnosis not present

## 2017-10-16 DIAGNOSIS — I361 Nonrheumatic tricuspid (valve) insufficiency: Secondary | ICD-10-CM | POA: Diagnosis not present

## 2017-10-16 DIAGNOSIS — Z7984 Long term (current) use of oral hypoglycemic drugs: Secondary | ICD-10-CM

## 2017-10-16 DIAGNOSIS — Z79899 Other long term (current) drug therapy: Secondary | ICD-10-CM | POA: Diagnosis not present

## 2017-10-16 DIAGNOSIS — I152 Hypertension secondary to endocrine disorders: Secondary | ICD-10-CM | POA: Diagnosis present

## 2017-10-16 DIAGNOSIS — Z7982 Long term (current) use of aspirin: Secondary | ICD-10-CM

## 2017-10-16 DIAGNOSIS — E119 Type 2 diabetes mellitus without complications: Secondary | ICD-10-CM

## 2017-10-16 DIAGNOSIS — Q211 Atrial septal defect: Secondary | ICD-10-CM | POA: Diagnosis not present

## 2017-10-16 DIAGNOSIS — E785 Hyperlipidemia, unspecified: Secondary | ICD-10-CM | POA: Diagnosis not present

## 2017-10-16 DIAGNOSIS — M109 Gout, unspecified: Secondary | ICD-10-CM | POA: Diagnosis not present

## 2017-10-16 DIAGNOSIS — Z9049 Acquired absence of other specified parts of digestive tract: Secondary | ICD-10-CM

## 2017-10-16 DIAGNOSIS — E1159 Type 2 diabetes mellitus with other circulatory complications: Secondary | ICD-10-CM | POA: Diagnosis present

## 2017-10-16 DIAGNOSIS — N189 Chronic kidney disease, unspecified: Secondary | ICD-10-CM | POA: Diagnosis not present

## 2017-10-16 LAB — I-STAT TROPONIN, ED: Troponin i, poc: 0.17 ng/mL (ref 0.00–0.08)

## 2017-10-16 LAB — TSH: TSH: 2.863 u[IU]/mL (ref 0.350–4.500)

## 2017-10-16 LAB — CBC
HEMATOCRIT: 35.3 % — AB (ref 36.0–46.0)
HEMOGLOBIN: 11.8 g/dL — AB (ref 12.0–15.0)
MCH: 31.6 pg (ref 26.0–34.0)
MCHC: 33.4 g/dL (ref 30.0–36.0)
MCV: 94.6 fL (ref 78.0–100.0)
Platelets: 103 10*3/uL — ABNORMAL LOW (ref 150–400)
RBC: 3.73 MIL/uL — ABNORMAL LOW (ref 3.87–5.11)
RDW: 14.8 % (ref 11.5–15.5)
WBC: 6.1 10*3/uL (ref 4.0–10.5)

## 2017-10-16 LAB — BASIC METABOLIC PANEL
ANION GAP: 11 (ref 5–15)
BUN: 15 mg/dL (ref 6–20)
CO2: 22 mmol/L (ref 22–32)
Calcium: 9.3 mg/dL (ref 8.9–10.3)
Chloride: 107 mmol/L (ref 101–111)
Creatinine, Ser: 1.25 mg/dL — ABNORMAL HIGH (ref 0.44–1.00)
GFR calc Af Amer: 51 mL/min — ABNORMAL LOW (ref >60)
GFR, EST NON AFRICAN AMERICAN: 44 mL/min — AB (ref >60)
Glucose, Bld: 134 mg/dL — ABNORMAL HIGH (ref 65–99)
POTASSIUM: 4.2 mmol/L (ref 3.5–5.1)
SODIUM: 140 mmol/L (ref 135–145)

## 2017-10-16 LAB — GLUCOSE, CAPILLARY: GLUCOSE-CAPILLARY: 121 mg/dL — AB (ref 65–99)

## 2017-10-16 LAB — HEMOGLOBIN A1C
HEMOGLOBIN A1C: 6 % — AB (ref 4.8–5.6)
Mean Plasma Glucose: 125.5 mg/dL

## 2017-10-16 LAB — TROPONIN I: TROPONIN I: 2.57 ng/mL — AB (ref <0.03)

## 2017-10-16 MED ORDER — ALLOPURINOL 300 MG PO TABS
300.0000 mg | ORAL_TABLET | Freq: Two times a day (BID) | ORAL | Status: DC
  Administered 2017-10-16 – 2017-10-17 (×3): 300 mg via ORAL
  Filled 2017-10-16 (×3): qty 1

## 2017-10-16 MED ORDER — HYDROCHLOROTHIAZIDE 12.5 MG PO CAPS
12.5000 mg | ORAL_CAPSULE | Freq: Every day | ORAL | Status: DC
  Administered 2017-10-17: 12.5 mg via ORAL
  Filled 2017-10-16: qty 1

## 2017-10-16 MED ORDER — HEPARIN (PORCINE) IN NACL 100-0.45 UNIT/ML-% IJ SOLN
1450.0000 [IU]/h | INTRAMUSCULAR | Status: DC
  Administered 2017-10-16: 1300 [IU]/h via INTRAVENOUS
  Administered 2017-10-17: 1450 [IU]/h via INTRAVENOUS
  Filled 2017-10-16 (×2): qty 250

## 2017-10-16 MED ORDER — HEPARIN BOLUS VIA INFUSION
4000.0000 [IU] | Freq: Once | INTRAVENOUS | Status: AC
  Administered 2017-10-16: 4000 [IU] via INTRAVENOUS
  Filled 2017-10-16: qty 4000

## 2017-10-16 MED ORDER — FERROUS SULFATE 325 (65 FE) MG PO TABS
325.0000 mg | ORAL_TABLET | Freq: Two times a day (BID) | ORAL | Status: DC
  Administered 2017-10-17 (×2): 325 mg via ORAL
  Filled 2017-10-16 (×2): qty 1

## 2017-10-16 MED ORDER — ASPIRIN EC 81 MG PO TBEC
81.0000 mg | DELAYED_RELEASE_TABLET | Freq: Every day | ORAL | Status: DC
  Administered 2017-10-17: 81 mg via ORAL
  Filled 2017-10-16: qty 1

## 2017-10-16 MED ORDER — POTASSIUM CHLORIDE CRYS ER 20 MEQ PO TBCR
20.0000 meq | EXTENDED_RELEASE_TABLET | Freq: Every day | ORAL | Status: DC
  Administered 2017-10-17: 20 meq via ORAL
  Filled 2017-10-16: qty 1

## 2017-10-16 MED ORDER — ACETAMINOPHEN 325 MG PO TABS: 650.0000 mg | ORAL_TABLET | ORAL | Status: DC | PRN

## 2017-10-16 MED ORDER — FENOFIBRATE 160 MG PO TABS
160.0000 mg | ORAL_TABLET | Freq: Every day | ORAL | Status: DC
  Administered 2017-10-17: 160 mg via ORAL
  Filled 2017-10-16: qty 1

## 2017-10-16 MED ORDER — VALSARTAN-HYDROCHLOROTHIAZIDE 80-12.5 MG PO TABS: 1.0000 | ORAL_TABLET | Freq: Every day | ORAL | Status: DC

## 2017-10-16 MED ORDER — INSULIN ASPART 100 UNIT/ML ~~LOC~~ SOLN
0.0000 [IU] | Freq: Three times a day (TID) | SUBCUTANEOUS | Status: DC
  Administered 2017-10-17 – 2017-10-18 (×2): 2 [IU] via SUBCUTANEOUS

## 2017-10-16 MED ORDER — ROSUVASTATIN CALCIUM 20 MG PO TABS: 40.0000 mg | ORAL_TABLET | Freq: Every day | ORAL | Status: DC

## 2017-10-16 MED ORDER — SENNOSIDES-DOCUSATE SODIUM 8.6-50 MG PO TABS: 1.0000 | ORAL_TABLET | Freq: Every evening | ORAL | Status: DC | PRN

## 2017-10-16 MED ORDER — PANTOPRAZOLE SODIUM 40 MG PO TBEC
40.0000 mg | DELAYED_RELEASE_TABLET | Freq: Every day | ORAL | Status: DC
  Administered 2017-10-17: 40 mg via ORAL
  Filled 2017-10-16 (×2): qty 1

## 2017-10-16 MED ORDER — NITROGLYCERIN 0.4 MG SL SUBL: 0.4000 mg | SUBLINGUAL_TABLET | SUBLINGUAL | Status: DC | PRN

## 2017-10-16 MED ORDER — ROSUVASTATIN CALCIUM 20 MG PO TABS
40.0000 mg | ORAL_TABLET | Freq: Every day | ORAL | Status: DC
  Administered 2017-10-17: 40 mg via ORAL
  Filled 2017-10-16: qty 2

## 2017-10-16 MED ORDER — IRBESARTAN 75 MG PO TABS
75.0000 mg | ORAL_TABLET | Freq: Every day | ORAL | Status: DC
  Administered 2017-10-17: 75 mg via ORAL
  Filled 2017-10-16: qty 1

## 2017-10-16 MED ORDER — ONDANSETRON HCL 4 MG/2ML IJ SOLN: 4.0000 mg | Freq: Four times a day (QID) | INTRAMUSCULAR | Status: DC | PRN

## 2017-10-16 NOTE — ED Notes (Addendum)
Pt allowed to eat/drink per MD.   Pt requested and given diet coke and [place] sandwich w/ apple sauce.

## 2017-10-16 NOTE — ED Notes (Signed)
XR notified pt ready for scan 

## 2017-10-16 NOTE — ED Provider Notes (Signed)
[facility] EMERGENCY DEPARTMENT Provider Note   CSN: [id number] Arrival date & time: 10/16/17  1350     History   Chief Complaint Chief Complaint  Patient presents with  . Chest Pain    HPI Rachel Black is a 67 y.o. female.  Plan of anterior chest pain described as pressure with a funny feeling in her left arm onset 10 AM today while riding the car.  Symptoms lasted approximately 2 hours and resolved without treatment.  She is presently asymptomatic.  She went to an urgent care center prior to coming here and was sent here for further evaluation.  Patient treated herself with one coated aspirin tablet 325 mg which she takes on a daily basis, earlier this morning patient became nauseated and ambulance while in route here however nausea has since resolved  HPI  Past Medical History:  Diagnosis Date  . Arthritis    PAIN AND OA BOTH KNEES AND SHOULDERS AND ELBOWS AND WRIST  . Blood transfusion without reported diagnosis 2018   after cholecystectomy  . Diabetes mellitus    ORAL MEDICATION  . GERD (gastroesophageal reflux disease)   . Gout    NO RECENT FLARE UPS  . H/O: rheumatic fever    AS A CHILD - NO KNOWN HEART MURMUR OR HEART PROBLEMS  . Hyperlipidemia   . Hypertension   . Shortness of breath    WITH EXERTION  . Stroke [facility])     Patient Active Problem List   Diagnosis Date Noted  . Need for shingles vaccine 09/21/2017  . Need for pneumococcal vaccine 09/21/2017  . History of rheumatic fever 06/30/2017  . PFO (patent foramen ovale)   . Cerebrovascular accident (CVA) due to embolism of right middle cerebral artery ([place]) 03/14/2017  . Dilation of biliary tract   . Elevated LFTs   . RUQ abdominal pain   . Acute gallstone pancreatitis 12/07/2016  . Choledocholithiasis with obstruction 12/07/2016  . AKI (acute kidney injury) ([place]) 12/07/2016  . Osteoarthritis of right knee 08/23/2015  . Status post total left knee replacement 08/23/2015  .  Arthritis of knee, right 04/20/2014  . Morbid obesity ([place]) 04/20/2014  . Status post total right knee replacement 04/20/2014  . Flushing reaction 03/10/2012  . Diabetes mellitus ([place]) 02/17/2007  . Mixed hyperlipidemia 02/17/2007  . GOUT 02/17/2007  . Severe obesity (BMI >= 40) ([place]) 02/17/2007  . Essential hypertension 02/17/2007  . RHEUMATIC FEVER, HX OF 02/17/2007    Past Surgical History:  Procedure Laterality Date  . ABDOMINAL HYSTERECTOMY    . CESAREAN SECTION    . CHOLECYSTECTOMY N/A 12/09/2016   Procedure: LAPAROSCOPIC CHOLECYSTECTOMY WITH INTRAOPERATIVE CHOLANGIOGRAM;  Surgeon: Stark Klein, MD;  [facility];  Service: General;  Laterality: N/A;  . CHOLECYSTECTOMY  12/09/2016  . COLONOSCOPY WITH PROPOFOL N/A 01/30/2013   Procedure: COLONOSCOPY WITH PROPOFOL;  Surgeon: Irene Shipper, MD;  [facility];  Service: Endoscopy;  Laterality: N/A;  . ERCP N/A 12/08/2016   Procedure: ENDOSCOPIC RETROGRADE CHOLANGIOPANCREATOGRAPHY (ERCP);  Surgeon: Ladene Artist, MD;  Location: [facility];  Service: Endoscopy;  Laterality: N/A;  . ESOPHAGOGASTRODUODENOSCOPY (EGD) WITH PROPOFOL N/A 01/30/2013   Procedure: ESOPHAGOGASTRODUODENOSCOPY (EGD) WITH PROPOFOL;  Surgeon: Irene Shipper, MD;  [facility];  Service: Endoscopy;  Laterality: N/A;  . Gallstone Surgeery  12/08/2016  . LOOP RECORDER INSERTION N/A 03/18/2017   Procedure: Loop Recorder Insertion;  Surgeon: Constance Haw, MD;  Location: [facility];  Service: Cardiovascular;  Laterality: N/A;  . PARTIAL HYSTERECTOMY    . TEE WITHOUT CARDIOVERSION N/A 03/16/2017   Procedure: TRANSESOPHAGEAL ECHOCARDIOGRAM (TEE);  Surgeon: Acie Fredrickson Wonda Cheng, MD;  Location: [facility] ENDOSCOPY;  Service: Cardiovascular;  Laterality: N/A;  . TOTAL KNEE ARTHROPLASTY Right 04/20/2014   Procedure: RIGHT TOTAL KNEE ARTHROPLASTY CONVERTED TO RIGHT KNEE REIMPLANTATION;  Surgeon: Mcarthur Rossetti, MD;  [facility];  Service:  Orthopedics;  Laterality: Right;  . TOTAL KNEE ARTHROPLASTY Left 08/23/2015   Procedure: LEFT TOTAL KNEE ARTHROPLASTY;  Surgeon: Mcarthur Rossetti, MD;  [facility];  Service: Orthopedics;  Laterality: Left;  Marland Kitchen VENTRAL HERNIA REPAIR     x2    OB History    No data available       Home Medications    Prior to Admission medications   Medication Sig Start Date End Date Taking? Authorizing Provider  ACCU-CHEK SOFTCLIX LANCETS lancets Use as instructed once a day.  DX Code: E11.9 09/21/17   Carollee Herter, Alferd Apa, DO  allopurinol (ZYLOPRIM) 300 MG tablet Take 1 tablet (300 mg total) by mouth 2 (two) times daily. 09/21/17   Ann Held, DO  aspirin 325 MG tablet Take 1 tablet (325 mg total) by mouth daily. 03/17/17   Reyne Dumas, MD  calcium carbonate (TUMS - DOSED IN MG ELEMENTAL CALCIUM) 500 MG chewable tablet Chew 2 tablets by mouth 3 (three) times daily as needed for indigestion or heartburn.    [provider]  calcium-vitamin D (OSCAL WITH D) 500-200 MG-UNIT per tablet Take 1 tablet by mouth daily with breakfast.     [provider]  dexlansoprazole (DEXILANT) 60 MG capsule TAKE 1 CAPSULE (60 MG TOTAL) BY MOUTH EVERY MORNING 07/06/17   Irene Shipper, MD  fenofibrate 160 MG tablet Take 1 tablet (160 mg total) by mouth daily. 09/21/17   Roma Schanz R, DO  ferrous sulfate 325 (65 FE) MG EC tablet Take 1 tablet (325 mg total) by mouth 2 (two) times daily. 07/06/17   Irene Shipper, MD  glucose blood (ACCU-CHEK AVIVA PLUS) test strip Use as instructed once a day.  DX Code E11.9 09/21/17   Carollee Herter, Alferd Apa, DO  potassium chloride SA (K-DUR,KLOR-CON) 20 MEQ tablet Take 1 tablet (20 mEq total) by mouth daily. 09/21/17   Ann Held, DO  ranitidine (ZANTAC) 150 MG tablet Take one in the morning and two at night 07/06/17   Irene Shipper, MD  rosuvastatin (CRESTOR) 40 MG tablet Take 1 tablet (40 mg total) by mouth daily. 09/27/17   Roma Schanz  R, DO  senna-docusate (SENOKOT-S) 8.6-50 MG tablet Take 1 tablet by mouth at bedtime as needed for mild constipation. 03/17/17   Reyne Dumas, MD  SitaGLIPtin-MetFORMIN HCl (JANUMET XR) [phone] MG TB24 Take 1 tablet by mouth daily. 09/21/17   Roma Schanz R, DO  valsartan-hydrochlorothiazide (DIOVAN-HCT) 80-12.5 MG tablet Take 1 tablet by mouth daily. 09/21/17   Ann Held, DO    Family History Family History  Problem Relation Age of Onset  . Diabetes Mother   . Hypertension Mother        entire family  . Stroke Father        CVA  . Hyperlipidemia Father        entire family  . Diabetes Father   . Heart disease Father   . Crohn's disease Son   . Irritable bowel syndrome Son     Social History  Social History   Tobacco Use  . Smoking status: Never Smoker  . Smokeless tobacco: Never Used  Substance Use Topics  . Alcohol use: No  . Drug use: No     Allergies   Other and Penicillins   Review of Systems Review of Systems  Constitutional: Negative.   HENT: Negative.   Respiratory: Positive for chest tightness.   Cardiovascular: Negative.   Gastrointestinal: Positive for nausea.  Musculoskeletal: Negative.   Skin: Negative.   Allergic/Immunologic: Positive for immunocompromised state.       Diabetic  Neurological: Negative.   Psychiatric/Behavioral: Negative.   All other systems reviewed and are negative.    Physical Exam Updated Vital Signs BP (!) 142/74 (BP Location: Right Arm)   Pulse 64   Temp 98.9 F (37.2 C) (Oral)   Resp 17   Ht 5\' 6"  (1.676 m)   Wt 122 kg (269 lb)   SpO2 100%   BMI 43.42 kg/m   Physical Exam  Constitutional: She appears well-developed and well-nourished.  HENT:  Head: Normocephalic and atraumatic.  Eyes: Conjunctivae are normal. Pupils are equal, round, and reactive to light.  Neck: Neck supple. No tracheal deviation present. No thyromegaly present.  Cardiovascular: Normal rate and regular rhythm.  No murmur  heard. Pulmonary/Chest: Effort normal and breath sounds normal.  Abdominal: Soft. Bowel sounds are normal. She exhibits no distension. There is no tenderness.  Musculoskeletal: Normal range of motion. She exhibits no edema or tenderness.  Neurological: She is alert. Coordination normal.  Skin: Skin is warm and dry. No rash noted.  Psychiatric: She has a normal mood and affect.  Nursing note and vitals reviewed.    ED Treatments / Results  Labs (all labs ordered are listed, but only abnormal results are displayed) Labs Reviewed  CBC - Abnormal; Notable for the following components:      Result Value   RBC 3.73 (*)    Hemoglobin 11.8 (*)    HCT 35.3 (*)    Platelets 103 (*)    All other components within normal limits  BASIC METABOLIC PANEL  I-STAT TROPONIN, ED    EKG  EKG Interpretation  Date/Time:  Saturday October 16 2017 13:51:57 EST Ventricular Rate:  68 PR Interval:    QRS Duration: 109 QT Interval:  416 QTC Calculation: 443 R Axis:   -76 Text Interpretation:  Sinus rhythm Left anterior fascicular block Probable anterolateral infarct, old Baseline wander in lead(s) V6 Since last tracing rate slower Confirmed by Orlie Dakin [phone]) on 10/16/2017 2:33:37 PM     Chest x-ray viewed by me Troponin I 0.17, elevated Results for orders placed or performed during the hospital encounter of 84/66/59  Basic metabolic panel  Result Value Ref Range   Sodium 140 135 - 145 mmol/L   Potassium 4.2 3.5 - 5.1 mmol/L   Chloride 107 101 - 111 mmol/L   CO2 22 22 - 32 mmol/L   Glucose, Bld 134 (H) 65 - 99 mg/dL   BUN 15 6 - 20 mg/dL   Creatinine, Ser 1.25 (H) 0.44 - 1.00 mg/dL   Calcium 9.3 8.9 - 10.3 mg/dL   GFR calc non Af Amer 44 (L) >60 mL/min   GFR calc Af Amer 51 (L) >60 mL/min   Anion gap 11 5 - 15  CBC  Result Value Ref Range   WBC 6.1 4.0 - 10.5 K/uL   RBC 3.73 (L) 3.87 - 5.11 MIL/uL   Hemoglobin 11.8 (L) 12.0 - 15.0 g/dL  HCT 35.3 (L) 36.0 - 46.0 %   MCV 94.6  78.0 - 100.0 fL   MCH 31.6 26.0 - 34.0 pg   MCHC 33.4 30.0 - 36.0 g/dL   RDW 14.8 11.5 - 15.5 %   Platelets 103 (L) 150 - 400 K/uL  I-stat troponin, ED  Result Value Ref Range   Troponin i, poc 0.17 (HH) 0.00 - 0.08 ng/mL   Comment NOTIFIED PHYSICIAN    Comment 3           Dg Chest 2 View  Result Date: 10/16/2017 CLINICAL DATA:  Right chest pain for the past 4 hr. EXAM: CHEST  2 VIEW COMPARISON:  04/13/2014. FINDINGS: Interval enlarged cardiac silhouette. Clear lungs with normal vasculature. No pleural fluid. Mild thoracic spine degenerative changes. IMPRESSION: Interval mild cardiomegaly.  No acute abnormality. Electronically Signed   By: Claudie Revering M.D.   On: 10/16/2017 14:42   Xr Knee 1-2 Views Left  Result Date: 10/06/2017 2 views of the left knee show a total knee arthroplasty with no complicating features.  There is no effusion and no acute findings.  There is no evidence of ostial lysis or loosening of the components.  Xr Knee 1-2 Views Right  Result Date: 10/06/2017 2 views of the right knee show a total knee arthroplasty with no complicating features.  There is no knee joint effusion and no acute findings.  There is no evidence of ostial lysis or loosening.  Radiology Dg Chest 2 View  Result Date: 10/16/2017 CLINICAL DATA:  Right chest pain for the past 4 hr. EXAM: CHEST  2 VIEW COMPARISON:  04/13/2014. FINDINGS: Interval enlarged cardiac silhouette. Clear lungs with normal vasculature. No pleural fluid. Mild thoracic spine degenerative changes. IMPRESSION: Interval mild cardiomegaly.  No acute abnormality. Electronically Signed   By: Claudie Revering M.D.   On: 10/16/2017 14:42    Procedures Procedures (including critical care time)  Medications Ordered in ED Medications - No data to display   Initial Impression / Assessment and Plan / ED Course  I have reviewed the triage vital signs and the nursing notes.  Pertinent labs & imaging results that were available during my  care of the patient were reviewed by me and considered in my medical decision making (see chart for details).     I consulted Dr.Koneswaren radiology service who requests overnight stay to be arranged by hospitalist service..  Cardiology service will consult and see patient while in the hospital Renal insufficiency is chronic  Dr.Hochrein to see patient in the ED and advised me that hospitalist admission is not necessary.  Cardiology service will admit patient. Final Clinical Impressions(s) / ED Diagnoses  Dx #1 NSTEMI #2  Chronic renal insufficiency Final diagnoses:  None    ED Discharge Orders    None       Orlie Dakin, MD 10/16/17 1538

## 2017-10-16 NOTE — Progress Notes (Signed)
Patient had 6 beat run of Vtach. Checked on patient. Denies CP. Felt a flutter sensation in chest but no other symptoms at this time. Will continue to monitor.

## 2017-10-16 NOTE — ED Notes (Signed)
Assisted pt to bedside commode.

## 2017-10-16 NOTE — ED Notes (Signed)
Patient transported to X-ray 

## 2017-10-16 NOTE — ED Triage Notes (Signed)
Pt via EMS for R sided CP that has since resolved. Pt reports she was getting a haircut when she began to "feel woozy and off". Pt denies SOB, diaphoresis, or CP at this time. EMS VS: 130/80, 78 NSR, 98% on RA, CBG 175. A&Ox4. Pt reports she took 325 ASA at approx 0930 AM.

## 2017-10-16 NOTE — ED Notes (Signed)
Awaiting admission orders from cardiology

## 2017-10-16 NOTE — Progress Notes (Signed)
Paged on call cardiology MD. Troponin 2.57. Patient without CP. Notified MD of the 6 beats of V tach as well.

## 2017-10-16 NOTE — ED Notes (Signed)
Unsuccessful attempt by Janett Billow RN x 2 to start IV.

## 2017-10-16 NOTE — ED Notes (Signed)
Attempted PIV x 2. No success at this time. Second RN to attempt.

## 2017-10-16 NOTE — H&P (Signed)
CARDIOLOGY ADMISSION NOTE  Patient ID: Rachel Black MRN: [id number] DOB/AGE: [birth year] 67 y.o.  Admit date: 10/16/2017 Primary Physician Carollee Herter, Alferd Apa, DO Primary Cardiologist   New (Dr. Percival Spanish) Chief Complaint  Chest pain Requesting  Dr. Winfred Leeds  HPI:   The patient had a cryptogenic stroke in 2018 and had a TEE and implanted loop.  She has not been found to have atrial fib as of 09/15/17.  She did have a PFO but no source of embolism was found.    Today she was riding in the truck with her husband.  She felt a mid sharp chest pain that was 6/10.  It lasted for about 10 minutes.  It was substernal and not like her reflux.  She had no radiation to her arms or jaw.  It went away on its own.  She has not had this before.  She went on to have her hair cut but did not feel well afterward so went to an urgent care that was across the street.  They sent her to the ED first POC troponin was abnormal.  EKG with slight anterior ST depression.  She is currently is pain free.  She is limited by knee pain and can do light housework.  The patient denies any new symptoms such as shortness of breath, PND or orthopnea. There have been no reported palpitations, presyncope or syncope.    Past Medical History:  Diagnosis Date  . Arthritis    PAIN AND OA BOTH KNEES AND SHOULDERS AND ELBOWS AND WRIST  . Blood transfusion without reported diagnosis 2018   after cholecystectomy  . Diabetes mellitus    ORAL MEDICATION  . GERD (gastroesophageal reflux disease)   . Gout    NO RECENT FLARE UPS  . H/O: rheumatic fever    AS A CHILD - NO KNOWN HEART MURMUR OR HEART PROBLEMS  . Hyperlipidemia   . Hypertension   . Stroke [facility])     Past Surgical History:  Procedure Laterality Date  . ABDOMINAL HYSTERECTOMY    . CESAREAN SECTION    . CHOLECYSTECTOMY N/A 12/09/2016   Procedure: LAPAROSCOPIC CHOLECYSTECTOMY WITH INTRAOPERATIVE CHOLANGIOGRAM;  Surgeon: Stark Klein, MD;  Location: [facility];   Service: General;  Laterality: N/A;  . COLONOSCOPY WITH PROPOFOL N/A 01/30/2013   Procedure: COLONOSCOPY WITH PROPOFOL;  Surgeon: Irene Shipper, MD;  Location: [facility];  Service: Endoscopy;  Laterality: N/A;  . ERCP N/A 12/08/2016   Procedure: ENDOSCOPIC RETROGRADE CHOLANGIOPANCREATOGRAPHY (ERCP);  Surgeon: Ladene Artist, MD;  Location: [facility];  Service: Endoscopy;  Laterality: N/A;  . ESOPHAGOGASTRODUODENOSCOPY (EGD) WITH PROPOFOL N/A 01/30/2013   Procedure: ESOPHAGOGASTRODUODENOSCOPY (EGD) WITH PROPOFOL;  Surgeon: Irene Shipper, MD;  Location: [facility];  Service: Endoscopy;  Laterality: N/A;  . LOOP RECORDER INSERTION N/A 03/18/2017   Procedure: Loop Recorder Insertion;  Surgeon: Constance Haw, MD;  Location: [place] CV LAB;  Service: Cardiovascular;  Laterality: N/A;  . PARTIAL HYSTERECTOMY    . TEE WITHOUT CARDIOVERSION N/A 03/16/2017   Procedure: TRANSESOPHAGEAL ECHOCARDIOGRAM (TEE);  Surgeon: Acie Fredrickson Wonda Cheng, MD;  Location: [facility] ENDOSCOPY;  Service: Cardiovascular;  Laterality: N/A;  . TOTAL KNEE ARTHROPLASTY Right 04/20/2014   Procedure: RIGHT TOTAL KNEE ARTHROPLASTY CONVERTED TO RIGHT KNEE REIMPLANTATION;  Surgeon: Mcarthur Rossetti, MD;  Location: [facility];  Service: Orthopedics;  Laterality: Right;  . TOTAL KNEE ARTHROPLASTY Left 08/23/2015   Procedure: LEFT TOTAL KNEE ARTHROPLASTY;  Surgeon: Mcarthur Rossetti, MD;  Location: [facility];  Service: Orthopedics;  Laterality: Left;  Marland Kitchen VENTRAL HERNIA REPAIR     x2    Allergies  Allergen Reactions  . Other     SOME BANDAIDS CAUSE SKIN IRRITATION  . Penicillins Itching    Has patient had a PCN reaction causing immediate rash, facial/tongue/throat swelling, SOB or lightheadedness with hypotension: No Has patient had a PCN reaction causing severe rash involving mucus membranes or skin necrosis: No Has patient had a PCN reaction that required hospitalization No Has patient had a PCN reaction occurring within the last 10  years: No If all of the above answers are "NO", then may proceed with Cephalosporin use.    Prior to Admission medications   Medication Sig Start Date End Date Taking? Authorizing Provider  ACCU-CHEK SOFTCLIX LANCETS lancets Use as instructed once a day.  DX Code: E11.9 09/21/17   Carollee Herter, Alferd Apa, DO  allopurinol (ZYLOPRIM) 300 MG tablet Take 1 tablet (300 mg total) by mouth 2 (two) times daily. 09/21/17   Ann Held, DO  aspirin 325 MG tablet Take 1 tablet (325 mg total) by mouth daily. 03/17/17   Reyne Dumas, MD  calcium carbonate (TUMS - DOSED IN MG ELEMENTAL CALCIUM) 500 MG chewable tablet Chew 2 tablets by mouth 3 (three) times daily as needed for indigestion or heartburn.    [provider]  calcium-vitamin D (OSCAL WITH D) 500-200 MG-UNIT per tablet Take 1 tablet by mouth daily with breakfast.     [provider]  dexlansoprazole (DEXILANT) 60 MG capsule TAKE 1 CAPSULE (60 MG TOTAL) BY MOUTH EVERY MORNING 07/06/17   Irene Shipper, MD  fenofibrate 160 MG tablet Take 1 tablet (160 mg total) by mouth daily. 09/21/17   Roma Schanz R, DO  ferrous sulfate 325 (65 FE) MG EC tablet Take 1 tablet (325 mg total) by mouth 2 (two) times daily. 07/06/17   Irene Shipper, MD  glucose blood (ACCU-CHEK AVIVA PLUS) test strip Use as instructed once a day.  DX Code E11.9 09/21/17   Carollee Herter, Alferd Apa, DO  potassium chloride SA (K-DUR,KLOR-CON) 20 MEQ tablet Take 1 tablet (20 mEq total) by mouth daily. 09/21/17   Ann Held, DO  ranitidine (ZANTAC) 150 MG tablet Take one in the morning and two at night 07/06/17   Irene Shipper, MD  rosuvastatin (CRESTOR) 20 MG tablet Take 1 tablet (20 mg total) by mouth daily. 09/27/17   Roma Schanz R, DO  senna-docusate (SENOKOT-S) 8.6-50 MG tablet Take 1 tablet by mouth at bedtime as needed for mild constipation. 03/17/17   Reyne Dumas, MD  SitaGLIPtin-MetFORMIN HCl (JANUMET XR) [phone] MG TB24 Take 1 tablet by mouth  daily. 09/21/17   Roma Schanz R, DO  valsartan-hydrochlorothiazide (DIOVAN-HCT) 80-12.5 MG tablet Take 1 tablet by mouth daily. 09/21/17   Ann Held, DO     (Not in a hospital admission) Family History  Problem Relation Age of Onset  . Diabetes Mother   . Hypertension Mother        entire family  . Stroke Father        CVA  . Hyperlipidemia Father        entire family  . Diabetes Father   . Heart disease Father        No details.  Not at an early age.    . Crohn's disease Son   . Irritable bowel syndrome Son     Social History  Socioeconomic History  . Marital status: Married    Spouse name: Not on file  . Number of children: 2  . Years of education: Not on file  . Highest education level: Not on file  Social Needs  . Financial resource strain: Not on file  . Food insecurity - worry: Not on file  . Food insecurity - inability: Not on file  . Transportation needs - medical: Not on file  . Transportation needs - non-medical: Not on file  Occupational History  . Occupation: housewife    Employer: UNEMPLOYED  Tobacco Use  . Smoking status: Never Smoker  . Smokeless tobacco: Never Used  Substance and Sexual Activity  . Alcohol use: No  . Drug use: No  . Sexual activity: Yes    Partners: Male  Other Topics Concern  . Not on file  Social History Narrative   No exercise secondary to knee pain     ROS:    As stated in the HPI and negative for all other systems.  Physical Exam: Blood pressure (!) 134/57, pulse 62, temperature 98.9 F (37.2 C), temperature source Oral, resp. rate 18, height 5' 6"  (1.676 m), weight 269 lb (122 kg), SpO2 98 %.  GENERAL:  Well appearing HEENT:  Pupils equal round and reactive, fundi not visualized, oral mucosa unremarkable NECK:  No jugular venous distention, waveform within normal limits, carotid upstroke brisk and symmetric, no bruits, no thyromegaly LYMPHATICS:  No cervical, inguinal adenopathy LUNGS:  Clear to  auscultation bilaterally BACK:  No CVA tenderness CHEST:  Unremarkable HEART:  PMI not displaced or sustained,S1 and S2 within normal limits, no S3, no S4, no clicks, no rubs, no murmurs ABD:  Flat, positive bowel sounds normal in frequency in pitch, no bruits, no rebound, no guarding, no midline pulsatile mass, no hepatomegaly, no splenomegaly, obese EXT:  2 plus pulses throughout, no edema, no cyanosis no clubbing SKIN:  No rashes no nodules NEURO:  Cranial nerves II through XII grossly intact, motor grossly intact throughout PSYCH:  Cognitively intact, oriented to person place and time   Labs: Lab Results  Component Value Date   BUN 15 10/16/2017   Lab Results  Component Value Date   CREATININE 1.25 (H) 10/16/2017   Lab Results  Component Value Date   NA 140 10/16/2017   K 4.2 10/16/2017   CL 107 10/16/2017   CO2 22 10/16/2017   No results found for: TROPONINI Lab Results  Component Value Date   WBC 6.1 10/16/2017   HGB 11.8 (L) 10/16/2017   HCT 35.3 (L) 10/16/2017   MCV 94.6 10/16/2017   PLT 103 (L) 10/16/2017   Lab Results  Component Value Date   CHOL 138 09/21/2017   HDL 30.60 (L) 09/21/2017   LDLCALC 45 03/15/2017   LDLDIRECT 68.0 09/21/2017   TRIG 305.0 (H) 09/21/2017   CHOLHDL 5 09/21/2017   Lab Results  Component Value Date   ALT 20 09/21/2017   AST 27 09/21/2017   ALKPHOS 33 (L) 09/21/2017   BILITOT 0.4 09/21/2017   Lab Results  Component Value Date   HGBA1C 5.9 09/21/2017     Radiology:   CXR: Interval enlarged cardiac silhouette. Clear lungs with normal vasculature. No pleural fluid. Mild thoracic spine degenerative Changes  EKG:   NSR, rate 68, LAD, LAFB, non specific anterior ST changes.  No significant change from previous.  10/16/2017  ASSESSMENT AND PLAN:   CHEST PAIN:  Somewhat atypical.  However, first POC markers normal.  I will observe and cycle enzymes.  Heparin ASA and continue current meds.  If enzymes are abnormal she will need  a cath.  Otherwise, Lexiscan Myoview.  Keep NPO after MN.  DM:  A1C is OK.  Continue current therapy  DYSLIPIDEMIA:  Labs as above.  She is on 20 mg Crestor although one place says 40 mg.  Continue current dose.  I checked her bottle and she is taking the 20 mg.  HTN:  BP controlled.  Continue current therapy.   SignedMinus Breeding 10/16/2017, 3:49 PM

## 2017-10-16 NOTE — ED Notes (Signed)
Pt given dinner tray.

## 2017-10-16 NOTE — ED Notes (Signed)
This RN was unable to draw back blood from IV upon starting peripheral line. Darrell EMT at the bedside to attempt to obtain blood for labs

## 2017-10-16 NOTE — ED Notes (Signed)
Pt updated to bed assignment

## 2017-10-16 NOTE — ED Notes (Addendum)
Cardiology at the bedside. Per Dr Percival Spanish, cardiology to admit pt

## 2017-10-16 NOTE — ED Notes (Signed)
Cardiology paged by this RN to f/u regarding admission status

## 2017-10-16 NOTE — ED Notes (Addendum)
ED Provider at bedside. 

## 2017-10-16 NOTE — Progress Notes (Signed)
ANTICOAGULATION CONSULT NOTE - Initial Consult  Pharmacy Consult for Heparin Indication: chest pain/ACS  Allergies  Allergen Reactions  . Other Other (See Comments)    SOME BANDAIDS CAUSE SKIN IRRITATION  . Penicillins Itching    Has patient had a PCN reaction causing immediate rash, facial/tongue/throat swelling, SOB or lightheadedness with hypotension: No Has patient had a PCN reaction causing severe rash involving mucus membranes or skin necrosis: No Has patient had a PCN reaction that required hospitalization No Has patient had a PCN reaction occurring within the last 10 years: No If all of the above answers are "NO", then may proceed with Cephalosporin use.     Patient Measurements: Height: 5\' 6"  (167.6 cm) Weight: 269 lb (122 kg) IBW/kg (Calculated) : 59.3 Heparin Dosing Weight: 88kg  Vital Signs: Temp: 98.9 F (37.2 C) (02/02 1356) Temp Source: Oral (02/02 1356) BP: 128/74 (02/02 1930) Pulse Rate: 71 (02/02 1930)  Labs: Recent Labs    10/16/17 1408  HGB 11.8*  HCT 35.3*  PLT 103*  CREATININE 1.25*    Estimated Creatinine Clearance: 59.8 mL/min (A) (by C-G formula based on SCr of 1.25 mg/dL (H)).   Medical History: Past Medical History:  Diagnosis Date  . Arthritis    PAIN AND OA BOTH KNEES AND SHOULDERS AND ELBOWS AND WRIST  . Blood transfusion without reported diagnosis 2018   after cholecystectomy  . Diabetes mellitus    ORAL MEDICATION  . GERD (gastroesophageal reflux disease)   . Gout    NO RECENT FLARE UPS  . H/O: rheumatic fever    AS A CHILD - NO KNOWN HEART MURMUR OR HEART PROBLEMS  . Hyperlipidemia   . Hypertension   . Stroke [facility])    Assessment: 66yof to begin heparin for chest pain with elevated troponin. No anticoagulants pta. Platelets low on admission at 103. Hgb ok.   Goal of Therapy:  Heparin level 0.3-0.7 units/ml Monitor platelets by anticoagulation protocol: Yes   Plan:  1) Heparin bolus 4000 units x 1 2) Heparin drip at  1300 units/hr 3) 6 hour heparin level 4) Daily heparin level and CBC  Deboraha Sprang 10/16/2017,8:10 PM

## 2017-10-16 NOTE — ED Notes (Signed)
I Stat Trop I results shown to [employer]

## 2017-10-17 ENCOUNTER — Other Ambulatory Visit (HOSPITAL_COMMUNITY): Payer: BLUE CROSS/BLUE SHIELD

## 2017-10-17 ENCOUNTER — Observation Stay (HOSPITAL_BASED_OUTPATIENT_CLINIC_OR_DEPARTMENT_OTHER): Payer: BLUE CROSS/BLUE SHIELD

## 2017-10-17 DIAGNOSIS — I129 Hypertensive chronic kidney disease with stage 1 through stage 4 chronic kidney disease, or unspecified chronic kidney disease: Secondary | ICD-10-CM | POA: Diagnosis present

## 2017-10-17 DIAGNOSIS — Q211 Atrial septal defect: Secondary | ICD-10-CM | POA: Diagnosis not present

## 2017-10-17 DIAGNOSIS — M109 Gout, unspecified: Secondary | ICD-10-CM | POA: Diagnosis present

## 2017-10-17 DIAGNOSIS — E785 Hyperlipidemia, unspecified: Secondary | ICD-10-CM

## 2017-10-17 DIAGNOSIS — I214 Non-ST elevation (NSTEMI) myocardial infarction: Principal | ICD-10-CM

## 2017-10-17 DIAGNOSIS — E1122 Type 2 diabetes mellitus with diabetic chronic kidney disease: Secondary | ICD-10-CM | POA: Diagnosis present

## 2017-10-17 DIAGNOSIS — I10 Essential (primary) hypertension: Secondary | ICD-10-CM

## 2017-10-17 DIAGNOSIS — N183 Chronic kidney disease, stage 3 (moderate): Secondary | ICD-10-CM

## 2017-10-17 DIAGNOSIS — Z9049 Acquired absence of other specified parts of digestive tract: Secondary | ICD-10-CM | POA: Diagnosis not present

## 2017-10-17 DIAGNOSIS — I208 Other forms of angina pectoris: Secondary | ICD-10-CM | POA: Diagnosis not present

## 2017-10-17 DIAGNOSIS — I361 Nonrheumatic tricuspid (valve) insufficiency: Secondary | ICD-10-CM | POA: Diagnosis not present

## 2017-10-17 DIAGNOSIS — Z6841 Body Mass Index (BMI) 40.0 and over, adult: Secondary | ICD-10-CM | POA: Diagnosis not present

## 2017-10-17 DIAGNOSIS — Z90710 Acquired absence of both cervix and uterus: Secondary | ICD-10-CM | POA: Diagnosis not present

## 2017-10-17 DIAGNOSIS — Z79899 Other long term (current) drug therapy: Secondary | ICD-10-CM | POA: Diagnosis not present

## 2017-10-17 DIAGNOSIS — I2510 Atherosclerotic heart disease of native coronary artery without angina pectoris: Secondary | ICD-10-CM | POA: Diagnosis present

## 2017-10-17 DIAGNOSIS — Z96653 Presence of artificial knee joint, bilateral: Secondary | ICD-10-CM | POA: Diagnosis present

## 2017-10-17 DIAGNOSIS — Z8673 Personal history of transient ischemic attack (TIA), and cerebral infarction without residual deficits: Secondary | ICD-10-CM | POA: Diagnosis not present

## 2017-10-17 DIAGNOSIS — I639 Cerebral infarction, unspecified: Secondary | ICD-10-CM

## 2017-10-17 DIAGNOSIS — Z7984 Long term (current) use of oral hypoglycemic drugs: Secondary | ICD-10-CM | POA: Diagnosis not present

## 2017-10-17 DIAGNOSIS — I472 Ventricular tachycardia: Secondary | ICD-10-CM | POA: Diagnosis present

## 2017-10-17 DIAGNOSIS — K219 Gastro-esophageal reflux disease without esophagitis: Secondary | ICD-10-CM | POA: Diagnosis present

## 2017-10-17 DIAGNOSIS — E782 Mixed hyperlipidemia: Secondary | ICD-10-CM | POA: Diagnosis present

## 2017-10-17 DIAGNOSIS — Z7982 Long term (current) use of aspirin: Secondary | ICD-10-CM | POA: Diagnosis not present

## 2017-10-17 DIAGNOSIS — E118 Type 2 diabetes mellitus with unspecified complications: Secondary | ICD-10-CM

## 2017-10-17 LAB — ECHOCARDIOGRAM COMPLETE
AVLVOTPG: 5 mmHg
CHL CUP MV DEC (S): 225
CHL CUP TV REG PEAK VELOCITY: 214 cm/s
E/e' ratio: 5.73
EWDT: 225 ms
FS: 29 % (ref 28–44)
Height: 66 in
IV/PV OW: 0.83
LA diam index: 1.98 cm/m2
LA vol A4C: 50 ml
LA vol: 53.8 mL
LASIZE: 45 mm
LAVOLIN: 23.7 mL/m2
LDCA: 4.15 cm2
LEFT ATRIUM END SYS DIAM: 45 mm
LV E/e' medial: 5.73
LV TDI E'MEDIAL: 8.27
LV e' LATERAL: 8.05 cm/s
LVEEAVG: 5.73
LVOT SV: 88 mL
LVOT VTI: 21.3 cm
LVOT peak vel: 112 cm/s
LVOTD: 23 mm
Lateral S' vel: 13.5 cm/s
MV pk A vel: 87.3 m/s
MVPKEVEL: 46.1 m/s
PW: 12 mm — AB (ref 0.6–1.1)
RV sys press: 21 mmHg
TAPSE: 16.1 mm
TDI e' lateral: 8.05
TR max vel: 214 cm/s
Weight: 4318.4 oz

## 2017-10-17 LAB — TROPONIN I
Troponin I: 7.5 ng/mL (ref <0.03)
Troponin I: 8.01 ng/mL (ref <0.03)

## 2017-10-17 LAB — CBC
HEMATOCRIT: 34.2 % — AB (ref 36.0–46.0)
Hemoglobin: 11.2 g/dL — ABNORMAL LOW (ref 12.0–15.0)
MCH: 30.9 pg (ref 26.0–34.0)
MCHC: 32.7 g/dL (ref 30.0–36.0)
MCV: 94.5 fL (ref 78.0–100.0)
PLATELETS: 101 10*3/uL — AB (ref 150–400)
RBC: 3.62 MIL/uL — ABNORMAL LOW (ref 3.87–5.11)
RDW: 14.9 % (ref 11.5–15.5)
WBC: 5 10*3/uL (ref 4.0–10.5)

## 2017-10-17 LAB — LIPID PANEL
CHOL/HDL RATIO: 5 ratio
Cholesterol: 131 mg/dL (ref 0–200)
HDL: 26 mg/dL — AB (ref >40)
LDL Cholesterol: 54 mg/dL (ref 0–99)
TRIGLYCERIDES: 253 mg/dL — AB (ref <150)
VLDL: 51 mg/dL — ABNORMAL HIGH (ref 0–40)

## 2017-10-17 LAB — HEPARIN LEVEL (UNFRACTIONATED)
HEPARIN UNFRACTIONATED: 0.26 [IU]/mL — AB (ref 0.30–0.70)
HEPARIN UNFRACTIONATED: 0.39 [IU]/mL (ref 0.30–0.70)

## 2017-10-17 LAB — BASIC METABOLIC PANEL
Anion gap: 13 (ref 5–15)
BUN: 13 mg/dL (ref 6–20)
CO2: 20 mmol/L — AB (ref 22–32)
CREATININE: 1.22 mg/dL — AB (ref 0.44–1.00)
Calcium: 9.3 mg/dL (ref 8.9–10.3)
Chloride: 107 mmol/L (ref 101–111)
GFR calc Af Amer: 53 mL/min — ABNORMAL LOW (ref >60)
GFR calc non Af Amer: 45 mL/min — ABNORMAL LOW (ref >60)
GLUCOSE: 121 mg/dL — AB (ref 65–99)
POTASSIUM: 3.7 mmol/L (ref 3.5–5.1)
SODIUM: 140 mmol/L (ref 135–145)

## 2017-10-17 LAB — GLUCOSE, CAPILLARY
GLUCOSE-CAPILLARY: 117 mg/dL — AB (ref 65–99)
GLUCOSE-CAPILLARY: 140 mg/dL — AB (ref 65–99)
Glucose-Capillary: 85 mg/dL (ref 65–99)
Glucose-Capillary: 117 mg/dL — ABNORMAL HIGH (ref 65–99)

## 2017-10-17 LAB — MAGNESIUM: Magnesium: 2 mg/dL (ref 1.7–2.4)

## 2017-10-17 MED ORDER — SODIUM CHLORIDE 0.9 % WEIGHT BASED INFUSION
3.0000 mL/kg/h | INTRAVENOUS | Status: DC
  Administered 2017-10-18: 3 mL/kg/h via INTRAVENOUS

## 2017-10-17 MED ORDER — SODIUM CHLORIDE 0.9% FLUSH: 3.0000 mL | INTRAVENOUS | Status: DC | PRN

## 2017-10-17 MED ORDER — METOPROLOL TARTRATE 25 MG PO TABS: 25.0000 mg | ORAL_TABLET | Freq: Two times a day (BID) | ORAL | Status: DC

## 2017-10-17 MED ORDER — ASPIRIN 81 MG PO CHEW
81.0000 mg | CHEWABLE_TABLET | ORAL | Status: AC
  Administered 2017-10-18: 81 mg via ORAL
  Filled 2017-10-17: qty 1

## 2017-10-17 MED ORDER — SODIUM CHLORIDE 0.9% FLUSH: 3.0000 mL | Freq: Two times a day (BID) | INTRAVENOUS | Status: DC

## 2017-10-17 MED ORDER — SODIUM CHLORIDE 0.9 % WEIGHT BASED INFUSION: 1.0000 mL/kg/h | INTRAVENOUS | Status: DC

## 2017-10-17 MED ORDER — SODIUM CHLORIDE 0.9 % IV SOLN: 250.0000 mL | INTRAVENOUS | Status: DC | PRN

## 2017-10-17 NOTE — H&P (View-Only) (Signed)
Progress Note  Patient Name: Rachel Black Date of Encounter: 10/17/2017  Primary Cardiologist: New (Dr. Percival Spanish)   Subjective   She currently denies chest pain and shortness of breath.   Inpatient Medications    Scheduled Meds: . allopurinol  300 mg Oral BID  . aspirin EC  81 mg Oral Daily  . fenofibrate  160 mg Oral Daily  . ferrous sulfate  325 mg Oral BID AC  . irbesartan  75 mg Oral Daily   And  . hydrochlorothiazide  12.5 mg Oral Daily  . insulin aspart  0-15 Units Subcutaneous TID WC  . pantoprazole  40 mg Oral Daily  . potassium chloride SA  20 mEq Oral Daily  . rosuvastatin  40 mg Oral Daily   Continuous Infusions: . heparin 1,450 Units/hr (10/17/17 0414)   PRN Meds: acetaminophen, nitroGLYCERIN, ondansetron (ZOFRAN) IV, senna-docusate   Vital Signs    Vitals:   10/16/17 1800 10/16/17 1845 10/16/17 1930 10/16/17 2014  BP: (!) 144/75 124/78 128/74 128/66  Pulse: 70 70 71 70  Resp: 16 (!) 22 15 16   Temp:    99 F (37.2 C)  TempSrc:    Oral  SpO2: 97% 98% 96% 96%  Weight:    269 lb 14.4 oz (122.4 kg)  Height:    5' 6"  (1.676 m)    Intake/Output Summary (Last 24 hours) at 10/17/2017 0825 Last data filed at 10/17/2017 4462 Gross per 24 hour  Intake 125.08 ml  Output -  Net 125.08 ml   Filed Weights   10/16/17 1354 10/16/17 2014  Weight: 269 lb (122 kg) 269 lb 14.4 oz (122.4 kg)    Telemetry    Sinus rhythm with 6-beat run of NSVT - Personally Reviewed  ECG    None today   Physical Exam   GEN: No acute distress.   Neck: No JVD Cardiac: RRR, no murmurs, rubs, or gallops.  Respiratory: Clear to auscultation bilaterally. GI: Soft, nontender, non-distended  MS: No edema; No deformity. Neuro:  Nonfocal  Psych: Normal affect   Labs    Chemistry Recent Labs  Lab 10/16/17 1408  NA 140  K 4.2  CL 107  CO2 22  GLUCOSE 134*  BUN 15  CREATININE 1.25*  CALCIUM 9.3  GFRNONAA 44*  GFRAA 51*  ANIONGAP 11     Hematology Recent Labs   Lab 10/16/17 1408  WBC 6.1  RBC 3.73*  HGB 11.8*  HCT 35.3*  MCV 94.6  MCH 31.6  MCHC 33.4  RDW 14.8  PLT 103*    Cardiac Enzymes Recent Labs  Lab 10/16/17 2049 10/17/17 0256  TROPONINI 2.57* 7.50*    Recent Labs  Lab 10/16/17 1421  TROPIPOC 0.17*     BNPNo results for input(s): BNP, PROBNP in the last 168 hours.   DDimer No results for input(s): DDIMER in the last 168 hours.   Radiology    Dg Chest 2 View  Result Date: 10/16/2017 CLINICAL DATA:  Right chest pain for the past 4 hr. EXAM: CHEST  2 VIEW COMPARISON:  04/13/2014. FINDINGS: Interval enlarged cardiac silhouette. Clear lungs with normal vasculature. No pleural fluid. Mild thoracic spine degenerative changes. IMPRESSION: Interval mild cardiomegaly.  No acute abnormality. Electronically Signed   By: Claudie Revering M.D.   On: 10/16/2017 14:42    Cardiac Studies   None  Patient Profile     67 y.o. female with a history of hypertension, type 2 diabetes mellitus, dyslipidemia, and cryptogenic stroke (has  loop recorder) admitted with chest pain and found to have troponin elevation consistent with NSTEMI.  Assessment & Plan    1. NSTEMI: Currently symptomatically stable. Troponins up to 7.5. On IV heparin, ASA, Lipitor, and angiotensin receptor blocker. SBP's have been in low 100's, highest 114 mmHg with HR in low 60's.  I will hold off on starting a beta blocker. I will order an echocardiogram to evaluated LV function. I will stop HCTZ given creatinine 1.25. Risks and benefits of cardiac catheterization have been discussed with the patient.  These include bleeding, infection, kidney damage, stroke, heart attack, death.  The patient understands these risks and is willing to proceed.  2. Hypertension: SBP's have been in low 100's, highest 114 mmHg with HR in low 60's. On angiotensin receptor blocker and HCTZ.  I will stop HCTZ given creatinine 1.25.  3. Dyslipidemia: Currently on Crestor 40 mg and fenofibrate. LDL  at goal (54). TG's elevated at 253.  4. Type 2 diabetes: A1C controlled at 6%.  5. CKD stage III: Creatinine 1.25. Stopping HCTZ.  6. Cryptogenic stroke: On ASA and statin. Has loop recorder. No evidence for a fib thus far.  7. NSVT: Likely driven by ischemia. Will check K and magnesium levels. Unable to add beta blockers at present with HR in low 60 bpm range and SBP in low 100's.   For questions or updates, please contact [place] Please consult [email or web address] for contact info under Cardiology/STEMI.      Signed, Kate Sable, MD  10/17/2017, 8:25 AM

## 2017-10-17 NOTE — Progress Notes (Signed)
[place] for Heparin Indication: chest pain/ACS  Allergies  Allergen Reactions  . Other Other (See Comments)    SOME BANDAIDS CAUSE SKIN IRRITATION  . Penicillins Itching    Has patient had a PCN reaction causing immediate rash, facial/tongue/throat swelling, SOB or lightheadedness with hypotension: No Has patient had a PCN reaction causing severe rash involving mucus membranes or skin necrosis: No Has patient had a PCN reaction that required hospitalization No Has patient had a PCN reaction occurring within the last 10 years: No If all of the above answers are "NO", then may proceed with Cephalosporin use.     Patient Measurements: Height: 5\' 6"  (167.6 cm) Weight: 269 lb 14.4 oz (122.4 kg) IBW/kg (Calculated) : 59.3 Heparin Dosing Weight: 88kg  Vital Signs: Temp: 99 F (37.2 C) (02/02 2014) Temp Source: Oral (02/02 2014) BP: 128/66 (02/02 2014) Pulse Rate: 70 (02/02 2014)  Labs: Recent Labs    10/16/17 1408 10/16/17 2049 10/17/17 0256  HGB 11.8*  --   --   HCT 35.3*  --   --   PLT 103*  --   --   HEPARINUNFRC  --   --  0.26*  CREATININE 1.25*  --   --   TROPONINI  --  2.57*  --     Estimated Creatinine Clearance: 59.9 mL/min (A) (by C-G formula based on SCr of 1.25 mg/dL (H)).   Medical History: Past Medical History:  Diagnosis Date  . Arthritis    PAIN AND OA BOTH KNEES AND SHOULDERS AND ELBOWS AND WRIST  . Blood transfusion without reported diagnosis 2018   after cholecystectomy  . Diabetes mellitus    ORAL MEDICATION  . GERD (gastroesophageal reflux disease)   . Gout    NO RECENT FLARE UPS  . H/O: rheumatic fever    AS A CHILD - NO KNOWN HEART MURMUR OR HEART PROBLEMS  . Hyperlipidemia   . Hypertension   . Stroke [facility])    Assessment: 66yof to begin heparin for chest pain with elevated troponin. No anticoagulants pta. Platelets low on admission at 103. Hgb ok.   2/3 AM: heparin level sub-therapeutic this  AM, no issues per RN.   Goal of Therapy:  Heparin level 0.3-0.7 units/ml Monitor platelets by anticoagulation protocol: Yes   Plan:  Inc heparin to 1450 units/hr 1200 HL  Narda Bonds 10/17/2017,4:02 AM

## 2017-10-17 NOTE — Progress Notes (Signed)
[place] for Heparin Indication: chest pain/ACS  Allergies  Allergen Reactions  . Other Other (See Comments)    SOME BANDAIDS CAUSE SKIN IRRITATION  . Penicillins Itching    Has patient had a PCN reaction causing immediate rash, facial/tongue/throat swelling, SOB or lightheadedness with hypotension: No Has patient had a PCN reaction causing severe rash involving mucus membranes or skin necrosis: No Has patient had a PCN reaction that required hospitalization No Has patient had a PCN reaction occurring within the last 10 years: No If all of the above answers are "NO", then may proceed with Cephalosporin use.     Patient Measurements: Height: 5\' 6"  (167.6 cm) Weight: 269 lb 14.4 oz (122.4 kg) IBW/kg (Calculated) : 59.3 Heparin Dosing Weight: 88kg  Vital Signs: BP: 144/46 (02/03 0837)  Labs: Recent Labs    10/16/17 1408 10/16/17 2049 10/17/17 0256 10/17/17 0847 10/17/17 1153  HGB 11.8*  --   --  11.2*  --   HCT 35.3*  --   --  34.2*  --   PLT 103*  --   --  101*  --   HEPARINUNFRC  --   --  0.26*  --  0.39  CREATININE 1.25*  --   --  1.22*  --   TROPONINI  --  2.57* 7.50* 8.01*  --     Estimated Creatinine Clearance: 61.3 mL/min (A) (by C-G formula based on SCr of 1.22 mg/dL (H)).   Medical History: Past Medical History:  Diagnosis Date  . Arthritis    PAIN AND OA BOTH KNEES AND SHOULDERS AND ELBOWS AND WRIST  . Blood transfusion without reported diagnosis 2018   after cholecystectomy  . Diabetes mellitus    ORAL MEDICATION  . GERD (gastroesophageal reflux disease)   . Gout    NO RECENT FLARE UPS  . H/O: rheumatic fever    AS A CHILD - NO KNOWN HEART MURMUR OR HEART PROBLEMS  . Hyperlipidemia   . Hypertension   . Stroke [facility])    Assessment: 66yo F to begin heparin for chest pain with elevated troponin. No anticoagulants pta. Platelets low on admission at 103. Hgb stable. No signs/sx of bleeding.  Heparin level  therapeutic at 0.39  Goal of Therapy:  Heparin level 0.3-0.7 units/ml Monitor platelets by anticoagulation protocol: Yes   Plan:  Continue heparin 1450 units/hr Daily heparin level, CBC Monitor clinical course, s/sx bleeding  Nida Boatman, PharmD PGY1 Acute Care Pharmacy Resident Pager: [phone] 10/17/2017,1:24 PM

## 2017-10-17 NOTE — Progress Notes (Addendum)
Progress Note  Patient Name: Rachel Black Date of Encounter: 10/17/2017  Primary Cardiologist: New (Dr. Percival Spanish)   Subjective   She currently denies chest pain and shortness of breath.   Inpatient Medications    Scheduled Meds: . allopurinol  300 mg Oral BID  . aspirin EC  81 mg Oral Daily  . fenofibrate  160 mg Oral Daily  . ferrous sulfate  325 mg Oral BID AC  . irbesartan  75 mg Oral Daily   And  . hydrochlorothiazide  12.5 mg Oral Daily  . insulin aspart  0-15 Units Subcutaneous TID WC  . pantoprazole  40 mg Oral Daily  . potassium chloride SA  20 mEq Oral Daily  . rosuvastatin  40 mg Oral Daily   Continuous Infusions: . heparin 1,450 Units/hr (10/17/17 0414)   PRN Meds: acetaminophen, nitroGLYCERIN, ondansetron (ZOFRAN) IV, senna-docusate   Vital Signs    Vitals:   10/16/17 1800 10/16/17 1845 10/16/17 1930 10/16/17 2014  BP: (!) 144/75 124/78 128/74 128/66  Pulse: 70 70 71 70  Resp: 16 (!) 22 15 16   Temp:    99 F (37.2 C)  TempSrc:    Oral  SpO2: 97% 98% 96% 96%  Weight:    269 lb 14.4 oz (122.4 kg)  Height:    5' 6"  (1.676 m)    Intake/Output Summary (Last 24 hours) at 10/17/2017 0825 Last data filed at 10/17/2017 0762 Gross per 24 hour  Intake 125.08 ml  Output -  Net 125.08 ml   Filed Weights   10/16/17 1354 10/16/17 2014  Weight: 269 lb (122 kg) 269 lb 14.4 oz (122.4 kg)    Telemetry    Sinus rhythm with 6-beat run of NSVT - Personally Reviewed  ECG    None today   Physical Exam   GEN: No acute distress.   Neck: No JVD Cardiac: RRR, no murmurs, rubs, or gallops.  Respiratory: Clear to auscultation bilaterally. GI: Soft, nontender, non-distended  MS: No edema; No deformity. Neuro:  Nonfocal  Psych: Normal affect   Labs    Chemistry Recent Labs  Lab 10/16/17 1408  NA 140  K 4.2  CL 107  CO2 22  GLUCOSE 134*  BUN 15  CREATININE 1.25*  CALCIUM 9.3  GFRNONAA 44*  GFRAA 51*  ANIONGAP 11     Hematology Recent Labs   Lab 10/16/17 1408  WBC 6.1  RBC 3.73*  HGB 11.8*  HCT 35.3*  MCV 94.6  MCH 31.6  MCHC 33.4  RDW 14.8  PLT 103*    Cardiac Enzymes Recent Labs  Lab 10/16/17 2049 10/17/17 0256  TROPONINI 2.57* 7.50*    Recent Labs  Lab 10/16/17 1421  TROPIPOC 0.17*     BNPNo results for input(s): BNP, PROBNP in the last 168 hours.   DDimer No results for input(s): DDIMER in the last 168 hours.   Radiology    Dg Chest 2 View  Result Date: 10/16/2017 CLINICAL DATA:  Right chest pain for the past 4 hr. EXAM: CHEST  2 VIEW COMPARISON:  04/13/2014. FINDINGS: Interval enlarged cardiac silhouette. Clear lungs with normal vasculature. No pleural fluid. Mild thoracic spine degenerative changes. IMPRESSION: Interval mild cardiomegaly.  No acute abnormality. Electronically Signed   By: Claudie Revering M.D.   On: 10/16/2017 14:42    Cardiac Studies   None  Patient Profile     67 y.o. female with a history of hypertension, type 2 diabetes mellitus, dyslipidemia, and cryptogenic stroke (has  loop recorder) admitted with chest pain and found to have troponin elevation consistent with NSTEMI.  Assessment & Plan    1. NSTEMI: Currently symptomatically stable. Troponins up to 7.5. On IV heparin, ASA, Lipitor, and angiotensin receptor blocker. SBP's have been in low 100's, highest 114 mmHg with HR in low 60's.  I will hold off on starting a beta blocker. I will order an echocardiogram to evaluated LV function. I will stop HCTZ given creatinine 1.25. Risks and benefits of cardiac catheterization have been discussed with the patient.  These include bleeding, infection, kidney damage, stroke, heart attack, death.  The patient understands these risks and is willing to proceed.  2. Hypertension: SBP's have been in low 100's, highest 114 mmHg with HR in low 60's. On angiotensin receptor blocker and HCTZ.  I will stop HCTZ given creatinine 1.25.  3. Dyslipidemia: Currently on Crestor 40 mg and fenofibrate. LDL  at goal (54). TG's elevated at 253.  4. Type 2 diabetes: A1C controlled at 6%.  5. CKD stage III: Creatinine 1.25. Stopping HCTZ.  6. Cryptogenic stroke: On ASA and statin. Has loop recorder. No evidence for a fib thus far.  7. NSVT: Likely driven by ischemia. Will check K and magnesium levels. Unable to add beta blockers at present with HR in low 60 bpm range and SBP in low 100's.   For questions or updates, please contact [place] Please consult [email or web address] for contact info under Cardiology/STEMI.      Signed, Kate Sable, MD  10/17/2017, 8:25 AM

## 2017-10-17 NOTE — Progress Notes (Signed)
  Echocardiogram 2D Echocardiogram has been performed.  Rachel Black 10/17/2017, 3:56 PM

## 2017-10-18 ENCOUNTER — Encounter (HOSPITAL_COMMUNITY): Payer: Self-pay | Admitting: Cardiology

## 2017-10-18 ENCOUNTER — Encounter (HOSPITAL_COMMUNITY)
Admission: EM | Disposition: A | Discharge status: Home / Self Care | Payer: Self-pay | Source: Home / Self Care | Attending: Cardiology

## 2017-10-18 DIAGNOSIS — E782 Mixed hyperlipidemia: Secondary | ICD-10-CM

## 2017-10-18 DIAGNOSIS — E1159 Type 2 diabetes mellitus with other circulatory complications: Secondary | ICD-10-CM

## 2017-10-18 DIAGNOSIS — Z794 Long term (current) use of insulin: Secondary | ICD-10-CM

## 2017-10-18 DIAGNOSIS — I214 Non-ST elevation (NSTEMI) myocardial infarction: Secondary | ICD-10-CM

## 2017-10-18 HISTORY — PX: LEFT HEART CATH AND CORONARY ANGIOGRAPHY: CATH118249

## 2017-10-18 LAB — BASIC METABOLIC PANEL
Anion gap: 11 (ref 5–15)
BUN: 14 mg/dL (ref 6–20)
CALCIUM: 9.1 mg/dL (ref 8.9–10.3)
CO2: 22 mmol/L (ref 22–32)
CREATININE: 1.3 mg/dL — AB (ref 0.44–1.00)
Chloride: 105 mmol/L (ref 101–111)
GFR calc non Af Amer: 42 mL/min — ABNORMAL LOW (ref >60)
GFR, EST AFRICAN AMERICAN: 49 mL/min — AB (ref >60)
Glucose, Bld: 119 mg/dL — ABNORMAL HIGH (ref 65–99)
Potassium: 3.4 mmol/L — ABNORMAL LOW (ref 3.5–5.1)
SODIUM: 138 mmol/L (ref 135–145)

## 2017-10-18 LAB — PROTIME-INR
INR: 1.01
PROTHROMBIN TIME: 13.2 s (ref 11.4–15.2)

## 2017-10-18 LAB — GLUCOSE, CAPILLARY
Glucose-Capillary: 132 mg/dL — ABNORMAL HIGH (ref 65–99)
Glucose-Capillary: 131 mg/dL — ABNORMAL HIGH (ref 65–99)

## 2017-10-18 LAB — CBC
HCT: 33.1 % — ABNORMAL LOW (ref 36.0–46.0)
Hemoglobin: 10.8 g/dL — ABNORMAL LOW (ref 12.0–15.0)
MCH: 30.9 pg (ref 26.0–34.0)
MCHC: 32.6 g/dL (ref 30.0–36.0)
MCV: 94.8 fL (ref 78.0–100.0)
Platelets: 102 10*3/uL — ABNORMAL LOW (ref 150–400)
RBC: 3.49 MIL/uL — ABNORMAL LOW (ref 3.87–5.11)
RDW: 15 % (ref 11.5–15.5)
WBC: 5.6 10*3/uL (ref 4.0–10.5)

## 2017-10-18 LAB — HEPARIN LEVEL (UNFRACTIONATED): Heparin Unfractionated: 0.42 IU/mL (ref 0.30–0.70)

## 2017-10-18 LAB — POCT ACTIVATED CLOTTING TIME: Activated Clotting Time: 125 seconds

## 2017-10-18 SURGERY — LEFT HEART CATH AND CORONARY ANGIOGRAPHY
Anesthesia: LOCAL

## 2017-10-18 MED ORDER — HEPARIN SODIUM (PORCINE) 5000 UNIT/ML IJ SOLN: 5000.0000 [IU] | Freq: Three times a day (TID) | INTRAMUSCULAR | Status: DC

## 2017-10-18 MED ORDER — POTASSIUM CHLORIDE CRYS ER 20 MEQ PO TBCR
40.0000 meq | EXTENDED_RELEASE_TABLET | Freq: Once | ORAL | Status: AC
  Administered 2017-10-18: 40 meq via ORAL
  Filled 2017-10-18: qty 2

## 2017-10-18 MED ORDER — SODIUM CHLORIDE 0.9% FLUSH: 3.0000 mL | Freq: Two times a day (BID) | INTRAVENOUS | Status: DC

## 2017-10-18 MED ORDER — ASPIRIN 81 MG PO TBEC: 81.0000 mg | DELAYED_RELEASE_TABLET | Freq: Every day | ORAL | Status: DC

## 2017-10-18 MED ORDER — IOPAMIDOL (ISOVUE-370) INJECTION 76%
INTRAVENOUS | Status: DC | PRN
  Administered 2017-10-18: 45 mL via INTRA_ARTERIAL

## 2017-10-18 MED ORDER — SODIUM CHLORIDE 0.9% FLUSH: 3.0000 mL | INTRAVENOUS | Status: DC | PRN

## 2017-10-18 MED ORDER — HEPARIN (PORCINE) IN NACL 2-0.9 UNIT/ML-% IJ SOLN
INTRAMUSCULAR | Status: AC | PRN
  Administered 2017-10-18: 1000 mL

## 2017-10-18 MED ORDER — SODIUM CHLORIDE 0.9 % IV SOLN: 250.0000 mL | INTRAVENOUS | Status: DC | PRN

## 2017-10-18 MED ORDER — VERAPAMIL HCL 2.5 MG/ML IV SOLN
INTRAVENOUS | Status: AC
  Filled 2017-10-18: qty 2

## 2017-10-18 MED ORDER — LIDOCAINE HCL (PF) 1 % IJ SOLN
INTRAMUSCULAR | Status: DC | PRN
  Administered 2017-10-18: 15 mL
  Administered 2017-10-18: 5 mL

## 2017-10-18 MED ORDER — FENTANYL CITRATE (PF) 100 MCG/2ML IJ SOLN
INTRAMUSCULAR | Status: AC
  Filled 2017-10-18: qty 2

## 2017-10-18 MED ORDER — HEPARIN (PORCINE) IN NACL 2-0.9 UNIT/ML-% IJ SOLN
INTRAMUSCULAR | Status: AC
  Filled 2017-10-18: qty 1000

## 2017-10-18 MED ORDER — IOPAMIDOL (ISOVUE-370) INJECTION 76%
INTRAVENOUS | Status: AC
  Filled 2017-10-18: qty 100

## 2017-10-18 MED ORDER — FENTANYL CITRATE (PF) 100 MCG/2ML IJ SOLN
INTRAMUSCULAR | Status: DC | PRN
  Administered 2017-10-18: 25 ug via INTRAVENOUS

## 2017-10-18 MED ORDER — MIDAZOLAM HCL 2 MG/2ML IJ SOLN
INTRAMUSCULAR | Status: DC | PRN
  Administered 2017-10-18: 2 mg via INTRAVENOUS

## 2017-10-18 MED ORDER — HEPARIN SODIUM (PORCINE) 1000 UNIT/ML IJ SOLN
INTRAMUSCULAR | Status: AC
  Filled 2017-10-18: qty 1

## 2017-10-18 MED ORDER — LIDOCAINE HCL (PF) 1 % IJ SOLN
INTRAMUSCULAR | Status: AC
  Filled 2017-10-18: qty 30

## 2017-10-18 MED ORDER — MIDAZOLAM HCL 2 MG/2ML IJ SOLN
INTRAMUSCULAR | Status: AC
  Filled 2017-10-18: qty 2

## 2017-10-18 MED ORDER — SODIUM CHLORIDE 0.9% FLUSH
10.0000 mL | INTRAVENOUS | Status: DC | PRN
Start: 2017-10-18 — End: 2017-10-18

## 2017-10-18 MED ORDER — SODIUM CHLORIDE 0.9 % WEIGHT BASED INFUSION: 1.0000 mL/kg/h | INTRAVENOUS | Status: DC

## 2017-10-18 MED ORDER — NITROGLYCERIN 0.4 MG SL SUBL: 0.4000 mg | SUBLINGUAL_TABLET | SUBLINGUAL | 2 refills | Status: DC | PRN

## 2017-10-18 SURGICAL SUPPLY — 12 items
CATH 5FR JL3.5 JR4 ANG PIG MP (CATHETERS) ×2 IMPLANT
COVER PRB 48X5XTLSCP FOLD TPE (BAG) ×1 IMPLANT
COVER PROBE 5X48 (BAG) ×1
GLIDESHEATH SLEND SS 6F .021 (SHEATH) ×2 IMPLANT
GUIDEWIRE INQWIRE 1.5J.035X260 (WIRE) ×1 IMPLANT
INQWIRE 1.5J .035X260CM (WIRE) ×2
KIT HEART LEFT (KITS) ×2 IMPLANT
PACK CARDIAC CATHETERIZATION (CUSTOM PROCEDURE TRAY) ×2 IMPLANT
SHEATH PINNACLE 5F 10CM (SHEATH) ×2 IMPLANT
TRANSDUCER W/STOPCOCK (MISCELLANEOUS) ×2 IMPLANT
TUBING CIL FLEX 10 FLL-RA (TUBING) ×2 IMPLANT
WIRE EMERALD 3MM-J .035X150CM (WIRE) ×2 IMPLANT

## 2017-10-18 NOTE — Progress Notes (Signed)
Patient received discharge information and acknowledged understanding of it. Patient received groin site education. Patient IV and midline removed.

## 2017-10-18 NOTE — Interval H&P Note (Signed)
History and Physical Interval Note:  10/18/2017 8:37 AM  Rachel Black  has presented today for surgery, with the diagnosis of NSTEMI  The various methods of treatment have been discussed with the patient and family. After consideration of risks, benefits and other options for treatment, the patient has consented to  Procedure(s): LEFT HEART CATH AND CORONARY ANGIOGRAPHY (N/A) as a surgical intervention .  The patient's history has been reviewed, patient examined, no change in status, stable for surgery.  I have reviewed the patient's chart and labs.  Questions were answered to the patient's satisfaction.   Cath Lab Visit (complete for each Cath Lab visit)  Clinical Evaluation Leading to the Procedure:   ACS: Yes.    Non-ACS:    Anginal Classification: CCS IV  Anti-ischemic medical therapy: No Therapy  Non-Invasive Test Results: No non-invasive testing performed  Prior CABG: No previous CABG       Atlas Kuc [place] MD,FACC 10/18/2017 8:37 AM

## 2017-10-18 NOTE — Progress Notes (Signed)
Site area: rt groin fa sheath Site Prior to Removal:  Level 0 Pressure Applied For:  20 minutes Manual:   yes Patient Status During Pull:  stable Post Pull Site:  Level 0 Post Pull Instructions Given:  yes Post Pull Pulses Present: palpable rt DP Dressing Applied:  Gauze and tegaderm Bedrest begins @  0086 Comments:

## 2017-10-18 NOTE — Discharge Summary (Signed)
Discharge Summary    Patient ID: Rachel Black,  MRN: [id number], DOB/AGE: 1951-02-05 67 y.o.  Admit date: 10/16/2017 Discharge date: 10/18/2017  Primary Care Provider: Roma Schanz R Primary Cardiologist: H[place]   Discharge Diagnoses    Principal Problem:   NSTEMI (non-ST elevated myocardial infarction) [facility]) Active Problems:   Diabetes mellitus ([place])   Mixed hyperlipidemia   Essential hypertension   Chest pain   Allergies Allergies  Allergen Reactions  . Other Other (See Comments)    SOME BANDAIDS CAUSE SKIN IRRITATION  . Penicillins Itching    Has patient had a PCN reaction causing immediate rash, facial/tongue/throat swelling, SOB or lightheadedness with hypotension: No Has patient had a PCN reaction causing severe rash involving mucus membranes or skin necrosis: No Has patient had a PCN reaction that required hospitalization No Has patient had a PCN reaction occurring within the last 10 years: No If all of the above answers are "NO", then may proceed with Cephalosporin use.     Diagnostic Studies/Procedures    Cath: 10/18/17  Conclusion     Lat 2nd Mrg lesion is 100% stenosed.  LV end diastolic pressure is normal.   1. Single vessel occlusive CAD with 100% occlusion of side branch of the second OM 2. Otherwise normal coronary anatomy 3. Normal LVEDP  Plan: medical therapy.    TTE: 10/17/17  Study Conclusions  - Left ventricle: The cavity size was normal. There was mild   concentric hypertrophy. Systolic function was normal. The   estimated ejection fraction was in the range of 55% to 60%. Wall   motion was normal; there were no regional wall motion   abnormalities. Doppler parameters are consistent with abnormal   left ventricular relaxation (grade 1 diastolic dysfunction). - Aortic valve: Transvalvular velocity was within the normal range.   There was no stenosis. There was no regurgitation. - Mitral valve: Transvalvular velocity  was within the normal range.   There was no evidence for stenosis. There was no regurgitation. - Right ventricle: The cavity size was normal. Wall thickness was   normal. Systolic function was normal. - Atrial septum: No defect or patent foramen ovale was identified   by color flow Doppler. - Tricuspid valve: There was mild regurgitation. - Pulmonary arteries: Systolic pressure was within the normal   range. PA peak pressure: 21 mm Hg (S). _____________   History of Present Illness     67 yo female with a PMH of cryptogenic stroke in 2018 and had a TEE and implanted loop.  She has not been found to have atrial fib as of 09/15/17.  She did have a PFO but no source of embolism was found.    The day of admission she was riding in the truck with her husband.  She felt a mid sharp chest pain that was 6/10.  It lasted for about 10 minutes.  It was substernal and not like her reflux.  She had no radiation to her arms or jaw.  It went away on its own.  She has not had this before.  She went on to have her hair cut but did not feel well afterward so went to an urgent care that was across the street.  They sent her to the ED first POC troponin was abnormal.  EKG with slight anterior ST depression.  She is limited by knee pain and can do light housework.  The patient denied any new symptoms such as shortness of breath, PND  or orthopnea. There have been no reported palpitations, presyncope or syncope. She was admitted for further work up.    Hospital Course     Initial plan was for [facility] but her troponins elevated to 8.01. Given this finding she was started on IV heparin and planned for cardiac cath. Underwent cardiac cath noted above with single vessel CAD with occlusion a side branch of 2nd OM. Normal LVEDP. Plan for medical therapy with ASA/plavix given her troponin elevation. No complications noted post cath. No room for BB given her bradycardia. Asked that she monitor her HR and BP as  outpatient and bring to follow up as may be able to add BB then.   General: Well developed, well nourished, obese female appearing in no acute distress. Head: Normocephalic, atraumatic.  Neck: Supple without bruits, JVD. Lungs:  Resp regular and unlabored, CTA. Heart: RRR, S1, S2, no S3, S4, or murmur; no rub. Abdomen: Soft, non-tender, non-distended with normoactive bowel sounds.  Extremities: No clubbing, cyanosis, edema. Distal pedal pulses are 2+ bilaterally. R femoral cath site stable without bruising or hematoma Neuro: Alert and oriented X 3. Moves all extremities spontaneously. Psych: Normal affect.  [name] was seen by Dr. Irish Lack and determined stable for discharge home. Follow up in the office has been arranged. Medications are listed below.   _____________  Discharge Vitals Blood pressure 123/74, pulse 78, temperature 98.6 F (37 C), temperature source Oral, resp. rate 12, height 5' 6"  (1.676 m), weight 265 lb 11.2 oz (120.5 kg), SpO2 100 %.  Filed Weights   10/16/17 1354 10/16/17 2014 10/18/17 0516  Weight: 269 lb (122 kg) 269 lb 14.4 oz (122.4 kg) 265 lb 11.2 oz (120.5 kg)    Labs & Radiologic Studies    CBC Recent Labs    10/17/17 0847 10/18/17 0545  WBC 5.0 5.6  HGB 11.2* 10.8*  HCT 34.2* 33.1*  MCV 94.5 94.8  PLT 101* 277*   Basic Metabolic Panel Recent Labs    10/17/17 0847 10/17/17 0902 10/18/17 0545  NA 140  --  138  K 3.7  --  3.4*  CL 107  --  105  CO2 20*  --  22  GLUCOSE 121*  --  119*  BUN 13  --  14  CREATININE 1.22*  --  1.30*  CALCIUM 9.3  --  9.1  MG  --  2.0  --    Liver Function Tests No results for input(s): AST, ALT, ALKPHOS, BILITOT, PROT, ALBUMIN in the last 72 hours. No results for input(s): LIPASE, AMYLASE in the last 72 hours. Cardiac Enzymes Recent Labs    10/16/17 2049 10/17/17 0256 10/17/17 0847  TROPONINI 2.57* 7.50* 8.01*   BNP Invalid input(s): POCBNP D-Dimer No results for input(s): DDIMER in the  last 72 hours. Hemoglobin A1C Recent Labs    10/16/17 2049  HGBA1C 6.0*   Fasting Lipid Panel Recent Labs    10/17/17 0256  CHOL 131  HDL 26*  LDLCALC 54  TRIG 253*  CHOLHDL 5.0   Thyroid Function Tests Recent Labs    10/16/17 2049  TSH 2.863   _____________  Dg Chest 2 View  Result Date: 10/16/2017 CLINICAL DATA:  Right chest pain for the past 4 hr. EXAM: CHEST  2 VIEW COMPARISON:  04/13/2014. FINDINGS: Interval enlarged cardiac silhouette. Clear lungs with normal vasculature. No pleural fluid. Mild thoracic spine degenerative changes. IMPRESSION: Interval mild cardiomegaly.  No acute abnormality. Electronically Signed   By: Remo Lipps  Joneen Caraway M.D.   On: 10/16/2017 14:42   Xr Knee 1-2 Views Left  Result Date: 10/06/2017 2 views of the left knee show a total knee arthroplasty with no complicating features.  There is no effusion and no acute findings.  There is no evidence of ostial lysis or loosening of the components.  Xr Knee 1-2 Views Right  Result Date: 10/06/2017 2 views of the right knee show a total knee arthroplasty with no complicating features.  There is no knee joint effusion and no acute findings.  There is no evidence of ostial lysis or loosening.  Disposition   Pt is being discharged home today in good condition.  Follow-up Plans & Appointments    Follow-up Information    Barrett, Evelene Croon, PA-C Follow up on 10/26/2017.   Specialties:  Cardiology, Radiology Why:  at 11am for your follow up appt.  Contact information: [address] [place] [address] [phone]          Discharge Instructions    Call MD for:  redness, tenderness, or signs of infection (pain, swelling, redness, odor or green/yellow discharge around incision site)   Complete by:  As directed    Diet - low sodium heart healthy   Complete by:  As directed    Discharge instructions   Complete by:  As directed    Groin Site Care Refer to this sheet in the next few  weeks. These instructions provide you with information on caring for yourself after your procedure. Your caregiver may also give you more specific instructions. Your treatment has been planned according to current medical practices, but problems sometimes occur. Call your caregiver if you have any problems or questions after your procedure. HOME CARE INSTRUCTIONS You may shower 24 hours after the procedure. Remove the bandage (dressing) and gently wash the site with plain soap and water. Gently pat the site dry.  Do not apply powder or lotion to the site.  Do not sit in a bathtub, swimming pool, or whirlpool for 5 to 7 days.  No bending, squatting, or lifting anything over 10 pounds (4.5 kg) as directed by your caregiver.  Inspect the site at least twice daily.  Do not drive home if you are discharged the same day of the procedure. Have someone else drive you.  You may drive 24 hours after the procedure unless otherwise instructed by your caregiver.  What to expect: Any bruising will usually fade within 1 to 2 weeks.  Blood that collects in the tissue (hematoma) may be painful to the touch. It should usually decrease in size and tenderness within 1 to 2 weeks.  SEEK IMMEDIATE MEDICAL CARE IF: You have unusual pain at the groin site or down the affected leg.  You have redness, warmth, swelling, or pain at the groin site.  You have drainage (other than a small amount of blood on the dressing).  You have chills.  You have a fever or persistent symptoms for more than 72 hours.  You have a fever and your symptoms suddenly get worse.  Your leg becomes pale, cool, tingly, or numb.  You have heavy bleeding from the site. Hold pressure on the site. .  Please monitor your HR and blood pressure and bring to your follow up appt.   Increase activity slowly   Complete by:  As directed       Discharge Medications     Medication List    STOP taking these medications   ranitidine  150 MG  tablet Commonly known as:  ZANTAC     TAKE these medications   ACCU-CHEK SOFTCLIX LANCETS lancets Use as instructed once a day.  DX Code: E11.9   allopurinol 300 MG tablet Commonly known as:  ZYLOPRIM Take 1 tablet (300 mg total) by mouth 2 (two) times daily.   aspirin 81 MG EC tablet Take 1 tablet (81 mg total) by mouth daily. What changed:    medication strength  how much to take  additional instructions  Another medication with the same name was removed. Continue taking this medication, and follow the directions you see here.   CALCIUM 600-D PO Take 1 tablet by mouth 2 (two) times daily before a meal.   dexlansoprazole 60 MG capsule Commonly known as:  DEXILANT TAKE 1 CAPSULE (60 MG TOTAL) BY MOUTH EVERY MORNING What changed:    how much to take  how to take this  when to take this  additional instructions   fenofibrate 160 MG tablet Take 1 tablet (160 mg total) by mouth daily.   ferrous sulfate 325 (65 FE) MG EC tablet Take 1 tablet (325 mg total) by mouth 2 (two) times daily. What changed:  when to take this   glucose blood test strip Commonly known as:  ACCU-CHEK AVIVA PLUS Use as instructed once a day.  DX Code E11.9   nitroGLYCERIN 0.4 MG SL tablet Commonly known as:  NITROSTAT Place 1 tablet (0.4 mg total) under the tongue every 5 (five) minutes x 3 doses as needed for chest pain.   potassium chloride SA 20 MEQ tablet Commonly known as:  K-DUR,KLOR-CON Take 1 tablet (20 mEq total) by mouth daily.   rosuvastatin 40 MG tablet Commonly known as:  CRESTOR Take 1 tablet (40 mg total) by mouth daily. What changed:  Another medication with the same name was removed. Continue taking this medication, and follow the directions you see here.   senna-docusate 8.6-50 MG tablet Commonly known as:  Senokot-S Take 1 tablet by mouth at bedtime as needed for mild constipation.   SitaGLIPtin-MetFORMIN HCl [id number] MG Tb24 Commonly known as:  JANUMET XR Take  1 tablet by mouth daily. What changed:  when to take this   TUMS PO Take 2 tablets by mouth 4 (four) times daily as needed (acid reflux).   UDDERLY SMOOTH Crea Apply 1 application topically See admin instructions. Apply topically to skin folds as needed for rash/irritation   valsartan-hydrochlorothiazide 80-12.5 MG tablet Commonly known as:  DIOVAN-HCT Take 1 tablet by mouth daily.        Aspirin prescribed at discharge?  Yes High Intensity Statin Prescribed? (Lipitor 40-44m or Crestor 20-464m: Yes Beta Blocker Prescribed? No:  Bradycardia, soft blood pressures during admission.  For EF <40%, was ACEI/ARB Prescribed? Yes ADP Receptor Inhibitor Prescribed? (i.e. Plavix etc.-Includes Medically Managed Patients): Yes For EF <40%, Aldosterone Inhibitor Prescribed? No: EF ok Was EF assessed during THIS hospitalization? Yes Was Cardiac Rehab II ordered? (Included Medically managed Patients): No   Outstanding Labs/Studies   N/a   Duration of Discharge Encounter   Greater than 30 minutes including physician time.  Signed, LiReino BellisP-C 10/18/2017, 3:02 PM   I have examined the patient and reviewed assessment and plan and discussed with patient.  Agree with above as stated.  S/p cath today.  Medical therapy for OM disease.  Add Plavix since she ruled in.  CONtinue aggressive secondary prevention including lipid lowering therapy and DAPT.  Watch for signs of bleeding.  If she has angina, could consider adding antianginal therapy.  Larae Grooms

## 2017-10-26 ENCOUNTER — Encounter: Payer: Self-pay | Admitting: Physician Assistant

## 2017-10-26 ENCOUNTER — Ambulatory Visit: Payer: BLUE CROSS/BLUE SHIELD | Admitting: Physician Assistant

## 2017-10-26 VITALS — BP 137/74 | HR 73 | Ht 66.0 in | Wt 271.4 lb

## 2017-10-26 DIAGNOSIS — R252 Cramp and spasm: Secondary | ICD-10-CM

## 2017-10-26 DIAGNOSIS — I10 Essential (primary) hypertension: Secondary | ICD-10-CM

## 2017-10-26 DIAGNOSIS — R600 Localized edema: Secondary | ICD-10-CM

## 2017-10-26 DIAGNOSIS — I214 Non-ST elevation (NSTEMI) myocardial infarction: Secondary | ICD-10-CM | POA: Diagnosis not present

## 2017-10-26 LAB — CUP PACEART REMOTE DEVICE CHECK
Date Time Interrogation Session: 20190131170644
Implantable Pulse Generator Implant Date: 20180705

## 2017-10-26 MED ORDER — POTASSIUM CHLORIDE CRYS ER 20 MEQ PO TBCR: 20.0000 meq | EXTENDED_RELEASE_TABLET | Freq: Every day | ORAL | 1 refills | Status: DC

## 2017-10-26 NOTE — Patient Instructions (Addendum)
Medication Instructions:  START Lasix Take 1 tablet as needed FOR 3lb weight gain per day or 5lb weight gain per week START Pepcid this medication can be purchased over the counter INCREASE Potassium to 2 tablets today and on days you take the Lasix   CHECK YOU WEIGHTS DAILY  Labwork: None   Testing/Procedures: None   Follow-Up: Your physician recommends that you schedule a follow-up appointment in: 3 Months with Dr Percival Spanish  ONLY EAT 2000MG  OF SODIUM A DAY   Any Other Special Instructions Will Be Listed Below (If Applicable). If you need a refill on your cardiac medications before your next appointment, please call your pharmacy.  website infomation [email or web address]://[email or web address]/diet-lifestyle-[email or web address]-changes-[email or web address]-[email or web address]  [email or web address].[email or web address]    Cooking With Less Salt Cooking with less salt is one way to reduce the amount of sodium you get from food. Depending on your condition and overall health, your health care provider or diet and nutrition specialist (dietitian) may recommend that you reduce your sodium intake. Most people should have less than 2,300 milligrams (mg) of sodium each day. If you have high blood pressure (hypertension), you may need to limit your sodium to 1,500 mg each day. Follow the tips below to help reduce your sodium intake. What do I need to know about cooking with less salt? Shopping  Buy sodium-free or low-sodium products. Look for the following words on food labels: ? Low-sodium. ? Sodium-free. ? Reduced-sodium. ? No salt added. ? Unsalted.  Buy fresh or frozen vegetables. Avoid canned vegetables.  Avoid buying meats or protein foods that have been injected with broth or saline solution.  Avoid cured or smoked meats, such as hot dogs, bacon, salami, ham, and bologna. Reading food labels  Check the food label before buying or using packaged ingredients.  Look  for products with no more than 140 mg of sodium in one serving.  Do not choose foods with salt as one of the first three ingredients on the ingredients list. If salt is one of the first three ingredients, it usually means the item is high in sodium, because ingredients are listed in order of amount in the food item. Cooking  Use herbs, seasonings without salt, and spices as substitutes for salt in foods.  Use sodium-free baking soda when baking.  Grill, braise, or roast foods to add flavor with less salt.  Avoid adding salt to pasta, rice, or hot cereals while cooking.  Drain and rinse canned vegetables before use.  Avoid adding salt when cooking sweets and desserts.  Cook with low-sodium ingredients. What are some salt alternatives? The following are herbs, seasonings, and spices that can be used instead of salt to give taste to your food. Herbs should be fresh or dried. Do not choose packaged mixes. Next to the name of the herb, spice, or seasoning are some examples of foods you can pair it with. Herbs  Bay leaves - Soups, meat and vegetable dishes, and spaghetti sauce.  Basil - [employer], soups, pasta, and fish dishes.  Cilantro - Meat, poultry, and vegetable dishes.  Chili powder - Marinades and Mexican dishes.  Chives - Salad dressings and potato dishes.  Cumin - Mexican dishes, couscous, and meat dishes.  Dill - Fish dishes, sauces, and salads.  Fennel - Meat and vegetable dishes, breads, and cookies.  Garlic (do not use garlic salt) - [place] dishes, meat dishes, salad dressings, and sauces.  Marjoram - Soups, potato dishes, and meat dishes.  Oregano - Pizza and spaghetti sauce.  Parsley - Salads,  soups, pasta, and meat dishes.  Rosemary - [place] dishes, salad dressings, soups, and red meats.  Saffron - Fish dishes, pasta, and some poultry dishes.  Sage - Stuffings and sauces.  Tarragon - Fish and [employer].  Thyme - Stuffing, meat, and fish  dishes. Seasonings  Lemon juice - Fish dishes, poultry dishes, vegetables, and salads.  Vinegar - Salad dressings, vegetables, and fish dishes. Spices  Cinnamon - Sweet dishes, such as cakes, cookies, and puddings.  Cloves - Gingerbread, puddings, and marinades for meats.  Curry - Vegetable dishes, fish and poultry dishes, and stir-fry dishes.  Ginger - Vegetables dishes, fish dishes, and stir-fry dishes.  Nutmeg - Pasta, vegetables, poultry, fish dishes, and custard. What are some low-sodium ingredients and foods?  Fresh or frozen fruits and vegetables with no sauce added.  Fresh or frozen whole meats, poultry, and fish with no sauce added.  Eggs.  Noodles, pasta, quinoa, rice.  Shredded or puffed wheat or puffed rice.  Regular or quick oats.  Milk, yogurt, hard cheeses, and low-sodium cheeses. Good cheese choices include Swiss, [place]. Always check the label for the serving size and sodium content.  Unsalted butter or margarine.  Unsalted nuts.  Sherbet or ice cream (keep to  cup per serving).  Homemade pudding.  Sodium-free baking soda and baking powder. This is not a complete list of low-sodium ingredients and foods. Contact your dietitian for more options. Summary  Cooking with less salt is one way to reduce the amount of sodium that you get from food.  Buy sodium-free or low-sodium products.  Check the food label before using or buying packaged ingredients.  Use herbs, seasonings without salt, and spices as substitutes for salt in foods. This information is not intended to replace advice given to you by your health care provider. Make sure you discuss any questions you have with your health care provider. Document Released: 08/31/2005 Document Revised: 09/08/2016 Document Reviewed: 09/08/2016 Elsevier Interactive Patient Education  2017 [employer].

## 2017-10-26 NOTE — Progress Notes (Signed)
Cardiology Office Note   Date:  10/26/2017   ID:  Rachel Black, DOB August 02, 1951, MRN [id number]  PCP:  Ann Held, DO  Cardiologist: Dr. Percival Spanish, 10/16/2017 in hospital Rosaria Ferries, PA-C   Chief Complaint  Patient presents with  . Follow-up    History of Present Illness: Rachel Black is a 67 y.o. female with a history of cryptogenic CVA 03/2017 s/p TEE and ILR (no A. fib as yet), PFO on TEE  Admitted 2/2-10/18/2017 for CP>> non-STEMI, s/p cath and echo below, medical therapy for occluded OM 2 branch.  No beta-blocker given bradycardia, on aspirin and Plavix, on high-dose statin  Rachel Black presents for cardiology follow up.  Since d/c, she has done fairly well. She has felt her heart pounding at times. She has not had the chest pain symptoms that she had before admission.  She has had her usual nightly heartburn. She is on Dexilant, compliant with this, take Tums with short-term relief.  When asked about food preferences, she likes many things that can cause reflux.  She admits that she does not try to avoid these foods.  She does not eat a low-sodium diet, likes many salty foods.  She does not eat a heart healthy diet.  She does not stick tightly to a diabetic diet.  She has chronic problems with knee pain, even after bilateral TKR. She gets cramps at night, in both legs. L leg bothers her more than the R, but both generally hurt.   Her activity level is poor at baseline because of multiple musculoskeletal aches and pains.  She is not sure that she would be able to do cardiac rehab and is not sure she wants to try.  She states she is waking up with lower extremity edema, but on further questioning, there has been no recent change.  The symptoms started before her knee replacement.  Her right leg is a little larger than her left, the left leg has been weak and she has had problems with foot drop since her CVA.  She does not believe she drinks more than 2 L of all  liquids daily.  She wonders why her medicines did not change more after her heart attack.  She has readings on her blood pressure with her today.  Her systolic varies between 254 and 134.  Diastolic blood pressure between 51 and 88, heart rate 73-93.   Past Medical History:  Diagnosis Date  . Arthritis    PAIN AND OA BOTH KNEES AND SHOULDERS AND ELBOWS AND WRIST  . Blood transfusion without reported diagnosis 2018   after cholecystectomy  . Diabetes mellitus    ORAL MEDICATION  . GERD (gastroesophageal reflux disease)   . Gout    NO RECENT FLARE UPS  . H/O: rheumatic fever    AS A CHILD - NO KNOWN HEART MURMUR OR HEART PROBLEMS  . Hyperlipidemia   . Hypertension   . Stroke [facility])     Past Surgical History:  Procedure Laterality Date  . ABDOMINAL HYSTERECTOMY    . CESAREAN SECTION    . CHOLECYSTECTOMY N/A 12/09/2016   Procedure: LAPAROSCOPIC CHOLECYSTECTOMY WITH INTRAOPERATIVE CHOLANGIOGRAM;  Surgeon: Stark Klein, MD;  Location: [facility];  Service: General;  Laterality: N/A;  . COLONOSCOPY WITH PROPOFOL N/A 01/30/2013   Procedure: COLONOSCOPY WITH PROPOFOL;  Surgeon: Irene Shipper, MD;  Location: [facility];  Service: Endoscopy;  Laterality: N/A;  . ERCP N/A 12/08/2016   Procedure: ENDOSCOPIC RETROGRADE  CHOLANGIOPANCREATOGRAPHY (ERCP);  Surgeon: Ladene Artist, MD;  Location: [facility];  Service: Endoscopy;  Laterality: N/A;  . ESOPHAGOGASTRODUODENOSCOPY (EGD) WITH PROPOFOL N/A 01/30/2013   Procedure: ESOPHAGOGASTRODUODENOSCOPY (EGD) WITH PROPOFOL;  Surgeon: Irene Shipper, MD;  Location: [facility];  Service: Endoscopy;  Laterality: N/A;  . LEFT HEART CATH AND CORONARY ANGIOGRAPHY N/A 10/18/2017   Procedure: LEFT HEART CATH AND CORONARY ANGIOGRAPHY;  Surgeon: Martinique, Peter M, MD;  Location: [place] [facility];  Service: Cardiovascular;  Laterality: N/A;  . LOOP RECORDER INSERTION N/A 03/18/2017   Procedure: Loop Recorder Insertion;  Surgeon: Constance Haw, MD;  Location: [place] [facility];  Service: Cardiovascular;  Laterality: N/A;  . PARTIAL HYSTERECTOMY    . TEE WITHOUT CARDIOVERSION N/A 03/16/2017   Procedure: TRANSESOPHAGEAL ECHOCARDIOGRAM (TEE);  Surgeon: Acie Fredrickson Wonda Cheng, MD;  Location: [facility] ENDOSCOPY;  Service: Cardiovascular;  Laterality: N/A;  . TOTAL KNEE ARTHROPLASTY Right 04/20/2014   Procedure: RIGHT TOTAL KNEE ARTHROPLASTY CONVERTED TO RIGHT KNEE REIMPLANTATION;  Surgeon: Mcarthur Rossetti, MD;  Location: [facility];  Service: Orthopedics;  Laterality: Right;  . TOTAL KNEE ARTHROPLASTY Left 08/23/2015   Procedure: LEFT TOTAL KNEE ARTHROPLASTY;  Surgeon: Mcarthur Rossetti, MD;  Location: [facility];  Service: Orthopedics;  Laterality: Left;  Marland Kitchen VENTRAL HERNIA REPAIR     x2    Current Outpatient Medications  Medication Sig Dispense Refill  . ACCU-CHEK SOFTCLIX LANCETS lancets Use as instructed once a day.  DX Code: E11.9 100 each 1  . allopurinol (ZYLOPRIM) 300 MG tablet Take 1 tablet (300 mg total) by mouth 2 (two) times daily. 180 tablet 3  . aspirin 81 MG EC tablet Take 1 tablet (81 mg total) by mouth daily.    . Calcium Carb-Cholecalciferol (CALCIUM 600-D PO) Take 1 tablet by mouth 2 (two) times daily before a meal.    . Calcium Carbonate Antacid (TUMS PO) Take 2 tablets by mouth 4 (four) times daily as needed (acid reflux).    Marland Kitchen dexlansoprazole (DEXILANT) 60 MG capsule TAKE 1 CAPSULE (60 MG TOTAL) BY MOUTH EVERY MORNING (Patient taking differently: Take 60 mg by mouth daily before breakfast. ) 90 capsule 3  . Emollient (UDDERLY SMOOTH) CREA Apply 1 application topically See admin instructions. Apply topically to skin folds as needed for rash/irritation    . fenofibrate 160 MG tablet Take 1 tablet (160 mg total) by mouth daily. 90 tablet 1  . ferrous sulfate 325 (65 FE) MG EC tablet Take 1 tablet (325 mg total) by mouth 2 (two) times daily. (Patient taking differently: Take 325 mg by mouth 2 (two) times daily before a meal. ) 60 tablet 11  . glucose  blood (ACCU-CHEK AVIVA PLUS) test strip Use as instructed once a day.  DX Code E11.9 100 each 1  . nitroGLYCERIN (NITROSTAT) 0.4 MG SL tablet Place 1 tablet (0.4 mg total) under the tongue every 5 (five) minutes x 3 doses as needed for chest pain. 25 tablet 2  . potassium chloride SA (K-DUR,KLOR-CON) 20 MEQ tablet Take 1 tablet (20 mEq total) by mouth daily. 90 tablet 1  . rosuvastatin (CRESTOR) 40 MG tablet Take 1 tablet (40 mg total) by mouth daily. 90 tablet 0  . senna-docusate (SENOKOT-S) 8.6-50 MG tablet Take 1 tablet by mouth at bedtime as needed for mild constipation. 30 tablet 1  . SitaGLIPtin-MetFORMIN HCl (JANUMET XR) [phone] MG TB24 Take 1 tablet by mouth daily. (Patient taking differently: Take 1 tablet by mouth daily before supper. )  90 tablet 1  . valsartan-hydrochlorothiazide (DIOVAN-HCT) 80-12.5 MG tablet Take 1 tablet by mouth daily. 90 tablet 1   No current facility-administered medications for this visit.     Allergies:   Other and Penicillins    Social History:  The patient  reports that  has never smoked. she has never used smokeless tobacco. She reports that she does not drink alcohol or use drugs.   Family History:  The patient's family history includes Crohn's disease in her son; Diabetes in her father and mother; Heart disease in her father; Hyperlipidemia in her father; Hypertension in her mother; Irritable bowel syndrome in her son; Stroke in her father.    ROS:  Please see the history of present illness. All other systems are reviewed and negative.    PHYSICAL EXAM: VS:  BP 137/74   Pulse 73   Ht 5\' 6"  (1.676 m)   Wt 271 lb 6.4 oz (123.1 kg)   BMI 43.81 kg/m  , BMI Body mass index is 43.81 kg/m. GEN: Well nourished, well developed, female in no acute distress  HEENT: normal for age  Neck: no JVD, no carotid bruit, no masses Cardiac: RRR; no murmur, no rubs, or gallops Respiratory: Decreased breath sounds bases bilaterally, normal work of breathing GI:  soft, nontender, nondistended, + BS MS: no deformity or atrophy; trace edema; distal pulses are 2+ in all 4 extremities   Skin: warm and dry, no rash Neuro:  Strength and sensation are at baseline Psych: euthymic mood, full affect   EKG:  EKG is not ordered today.  Cath: 10/18/17 Conclusion   Lat 2nd Mrg lesion is 100% stenosed.  LV end diastolic pressure is normal. 1. Single vessel occlusive CAD with 100% occlusion of side branch of the second OM 2. Otherwise normal coronary anatomy 3. Normal LVEDP Plan: medical therapy.  Diagnostic Diagram      TTE: 10/17/17 Study Conclusions - Left ventricle: The cavity size was normal. There was mild concentric hypertrophy. Systolic function was normal. The estimated ejection fraction was in the range of 55% to 60%. Wall motion was normal; there were no regional wall motion abnormalities. Doppler parameters are consistent with abnormal left ventricular relaxation (grade 1 diastolic dysfunction). - Aortic valve: Transvalvular velocity was within the normal range. There was no stenosis. There was no regurgitation. - Mitral valve: Transvalvular velocity was within the normal range. There was no evidence for stenosis. There was no regurgitation. - Right ventricle: The cavity size was normal. Wall thickness was normal. Systolic function was normal. - Atrial septum: No defect or patent foramen ovale was identified by color flow Doppler. - Tricuspid valve: There was mild regurgitation. - Pulmonary arteries: Systolic pressure was within the normal range. PA peak pressure: 21 mm Hg (S).   Recent Labs: 09/21/2017: ALT 20 10/16/2017: TSH 2.863 10/17/2017: Magnesium 2.0 10/18/2017: BUN 14; Creatinine, Ser 1.30; Hemoglobin 10.8; Platelets 102; Potassium 3.4; Sodium 138    Lipid Panel    Component Value Date/Time   CHOL 131 10/17/2017 0256   TRIG 253 (H) 10/17/2017 0256   HDL 26 (L) 10/17/2017 0256   CHOLHDL 5.0  10/17/2017 0256   VLDL 51 (H) 10/17/2017 0256   LDLCALC 54 10/17/2017 0256   LDLDIRECT 68.0 09/21/2017 1835     Wt Readings from Last 3 Encounters:  10/26/17 271 lb 6.4 oz (123.1 kg)  10/18/17 265 lb 11.2 oz (120.5 kg)  09/21/17 269 lb (122 kg)     Other studies Reviewed: Additional studies/ records  that were reviewed today include: Hospital records and testing.  ASSESSMENT AND PLAN:  1.  Non-STEMI: Showed she and her husband the cath diagram and explained why there was no intervention.  Her EF was normal by echo.  Continue current therapy including aspirin, sublingual nitroglycerin, high-dose Crestor, and ARB/diuretic.  No beta-blocker secondary to resting bradycardia seen in the hospital.  She is encouraged to increase her activity as tolerated and follow-up with cardiac rehab.  2.  Lower extremity edema: Pressures inside her heart were normal at cath and at echo.  She has mild lower extremity edema.  Her weight is up some, but I do not find much volume overload on exam.  She has poor dietary compliance with a low-sodium diet.  She is encouraged to stick to a 2000 mg/day sodium diet.  She is encouraged to do daily weights.  She was given Lasix 20 mg to take daily with an extra potassium tablet as needed for weight gain of 3 pounds in a day or 5 pounds in a week.  3.  GERD: She is concerned that her reflux symptoms are too much like her cardiac chest pain.  I gave her information on controlling her reflux by dietary changes.  There are multiple dietary changes that she could make.  I advised her that it is okay to take an H2 blocker such as Pepcid in the evening if she would like.  Follow-up with PCP or GI for further evaluation and treatment.  4.  Leg cramping: She is having some problems with leg cramping and her potassium was a little low the last time it was checked.  It is okay to take 2 extra potassium tablets today.  5.  Hypertension: Her blood pressure is a little above target  today, but her home blood pressure log shows that her systolic blood pressure generally runs between 124 and 134, no med changes.   Current medicines are reviewed at length with the patient today.  The patient has concerns regarding medicines.  The following changes have been made: Add as needed Lasix  Labs/ tests ordered today include:  No orders of the defined types were placed in this encounter.    Disposition:   FU with Dr. Percival Spanish  Signed, Rosaria Ferries, PA-C  10/26/2017 4:13 PM    [facility] Phone: [phone]; Fax: [phone]  This note was written with the assistance of speech recognition software. Please excuse any transcriptional errors.

## 2017-10-27 ENCOUNTER — Telehealth: Payer: Self-pay | Admitting: Physician Assistant

## 2017-10-27 MED ORDER — FUROSEMIDE 20 MG PO TABS: 20.0000 mg | ORAL_TABLET | Freq: Every day | ORAL | 3 refills | Status: DC

## 2017-10-27 NOTE — Telephone Encounter (Signed)
Rx has been sent to the pharmacy electronically. ° °

## 2017-10-27 NOTE — Telephone Encounter (Signed)
° °  Pt c/o medication issue:  1. Name of Medication: lasix  2. How are you currently taking this medication (dosage and times per day)? Unknown  3. Are you having a reaction (difficulty breathing--STAT)? no  4. What is your medication issue? No prescription at pharmacy

## 2017-11-02 ENCOUNTER — Ambulatory Visit: Payer: Medicare Other | Admitting: Nurse Practitioner

## 2017-11-16 ENCOUNTER — Ambulatory Visit (INDEPENDENT_AMBULATORY_CARE_PROVIDER_SITE_OTHER): Payer: BLUE CROSS/BLUE SHIELD | Admitting: *Deleted

## 2017-11-16 DIAGNOSIS — I639 Cerebral infarction, unspecified: Secondary | ICD-10-CM | POA: Diagnosis not present

## 2017-11-17 NOTE — Progress Notes (Signed)
Carelink Summary Report / Loop Recorder 

## 2017-12-03 NOTE — Progress Notes (Deleted)
[facility]  PATIENT: Rachel Black DOB: 1951-02-02   REASON FOR VISIT: *** HISTORY FROM:    HISTORY OF PRESENT ILLNESS:Rachel Black a 67 y.o.femalewith history of rheumatic fever, hypertension (inconsistently takes her blood pressure medicine), diabetes mellitus and hyperlipidemia admitted on 03/14/2017 for left arm weakness and slurred speech. CT head acute cortical infarct in the high right right total lobe. MRI confirmed acute infarct along the right lateral motor strength. MRA head and a CT neck negative. EF 55-60%. TEE positive PFO but otherwise normal, LE venous Doppler negative for DVT. Loop recorder placed. LDL 45 and A1c 5.4. She was discharged with aspirin 325 and continue the Crestor and fenofibrate.  Interval History 06/29/18 Dr. Erlinda Hong During the interval time, the patient has been doing well. Has HH PT/OT and left hand dexterity difficulty resolved. Currently walk with walker. Sugar controlled well at home, but does not check BP at home, today in clinic 115/70. Loop recorder so far no A. fib.     REVIEW OF SYSTEMS: Full 14 system review of systems performed and notable only for those listed, all others are neg:  Constitutional: neg  Cardiovascular: neg Ear/Nose/Throat: neg  Skin: neg Eyes: neg Respiratory: neg Gastroitestinal: neg  Hematology/Lymphatic: neg  Endocrine: neg Musculoskeletal:neg Allergy/Immunology: neg Neurological: neg Psychiatric: neg Sleep : neg   ALLERGIES: Allergies  Allergen Reactions  . Other Other (See Comments)    SOME BANDAIDS CAUSE SKIN IRRITATION  . Penicillins Itching    Has patient had a PCN reaction causing immediate rash, facial/tongue/throat swelling, SOB or lightheadedness with hypotension: No Has patient had a PCN reaction causing severe rash involving mucus membranes or skin necrosis: No Has patient had a PCN reaction that required hospitalization No Has patient had a PCN reaction occurring  within the last 10 years: No If all of the above answers are "NO", then may proceed with Cephalosporin use.     HOME MEDICATIONS: Outpatient Medications Prior to Visit  Medication Sig Dispense Refill  . ACCU-CHEK SOFTCLIX LANCETS lancets Use as instructed once a day.  DX Code: E11.9 100 each 1  . allopurinol (ZYLOPRIM) 300 MG tablet Take 1 tablet (300 mg total) by mouth 2 (two) times daily. 180 tablet 3  . aspirin 81 MG EC tablet Take 1 tablet (81 mg total) by mouth daily.    . Calcium Carb-Cholecalciferol (CALCIUM 600-D PO) Take 1 tablet by mouth 2 (two) times daily before a meal.    . Calcium Carbonate Antacid (TUMS PO) Take 2 tablets by mouth 4 (four) times daily as needed (acid reflux).    Marland Kitchen dexlansoprazole (DEXILANT) 60 MG capsule TAKE 1 CAPSULE (60 MG TOTAL) BY MOUTH EVERY MORNING (Patient taking differently: Take 60 mg by mouth daily before breakfast. ) 90 capsule 3  . Emollient (UDDERLY SMOOTH) CREA Apply 1 application topically See admin instructions. Apply topically to skin folds as needed for rash/irritation    . fenofibrate 160 MG tablet Take 1 tablet (160 mg total) by mouth daily. 90 tablet 1  . ferrous sulfate 325 (65 FE) MG EC tablet Take 1 tablet (325 mg total) by mouth 2 (two) times daily. (Patient taking differently: Take 325 mg by mouth 2 (two) times daily before a meal. ) 60 tablet 11  . furosemide (LASIX) 20 MG tablet Take 1 tablet (20 mg total) by mouth daily. 30 tablet 3  . glucose blood (ACCU-CHEK AVIVA PLUS) test strip Use as instructed once a day.  DX Code E11.9 100 each  1  . nitroGLYCERIN (NITROSTAT) 0.4 MG SL tablet Place 1 tablet (0.4 mg total) under the tongue every 5 (five) minutes x 3 doses as needed for chest pain. 25 tablet 2  . potassium chloride SA (K-DUR,KLOR-CON) 20 MEQ tablet Take 1 tablet (20 mEq total) by mouth daily. Take as instructed per provider 90 tablet 1  . rosuvastatin (CRESTOR) 40 MG tablet Take 1 tablet (40 mg total) by mouth daily. 90 tablet 0    . senna-docusate (SENOKOT-S) 8.6-50 MG tablet Take 1 tablet by mouth at bedtime as needed for mild constipation. 30 tablet 1  . SitaGLIPtin-MetFORMIN HCl (JANUMET XR) [id number] MG TB24 Take 1 tablet by mouth daily. (Patient taking differently: Take 1 tablet by mouth daily before supper. ) 90 tablet 1  . valsartan-hydrochlorothiazide (DIOVAN-HCT) 80-12.5 MG tablet Take 1 tablet by mouth daily. 90 tablet 1   No facility-administered medications prior to visit.     PAST MEDICAL HISTORY: Past Medical History:  Diagnosis Date  . Arthritis    PAIN AND OA BOTH KNEES AND SHOULDERS AND ELBOWS AND WRIST  . Blood transfusion without reported diagnosis 2018   after cholecystectomy  . Diabetes mellitus    ORAL MEDICATION  . GERD (gastroesophageal reflux disease)   . Gout    NO RECENT FLARE UPS  . H/O: rheumatic fever    AS A CHILD - NO KNOWN HEART MURMUR OR HEART PROBLEMS  . Hyperlipidemia   . Hypertension   . Stroke [facility])     PAST SURGICAL HISTORY: Past Surgical History:  Procedure Laterality Date  . ABDOMINAL HYSTERECTOMY    . CESAREAN SECTION    . CHOLECYSTECTOMY N/A 12/09/2016   Procedure: LAPAROSCOPIC CHOLECYSTECTOMY WITH INTRAOPERATIVE CHOLANGIOGRAM;  Surgeon: Stark Klein, MD;  Location: [facility];  Service: General;  Laterality: N/A;  . COLONOSCOPY WITH PROPOFOL N/A 01/30/2013   Procedure: COLONOSCOPY WITH PROPOFOL;  Surgeon: Irene Shipper, MD;  Location: [facility];  Service: Endoscopy;  Laterality: N/A;  . ERCP N/A 12/08/2016   Procedure: ENDOSCOPIC RETROGRADE CHOLANGIOPANCREATOGRAPHY (ERCP);  Surgeon: Ladene Artist, MD;  Location: [facility];  Service: Endoscopy;  Laterality: N/A;  . ESOPHAGOGASTRODUODENOSCOPY (EGD) WITH PROPOFOL N/A 01/30/2013   Procedure: ESOPHAGOGASTRODUODENOSCOPY (EGD) WITH PROPOFOL;  Surgeon: Irene Shipper, MD;  Location: [facility];  Service: Endoscopy;  Laterality: N/A;  . LEFT HEART CATH AND CORONARY ANGIOGRAPHY N/A 10/18/2017   Procedure: LEFT HEART CATH  AND CORONARY ANGIOGRAPHY;  Surgeon: Martinique, Peter M, MD;  Location: [place] [facility];  Service: Cardiovascular;  Laterality: N/A;  . LOOP RECORDER INSERTION N/A 03/18/2017   Procedure: Loop Recorder Insertion;  Surgeon: Constance Haw, MD;  Location: [place] [facility];  Service: Cardiovascular;  Laterality: N/A;  . PARTIAL HYSTERECTOMY    . TEE WITHOUT CARDIOVERSION N/A 03/16/2017   Procedure: TRANSESOPHAGEAL ECHOCARDIOGRAM (TEE);  Surgeon: Acie Fredrickson Wonda Cheng, MD;  Location: [facility] ENDOSCOPY;  Service: Cardiovascular;  Laterality: N/A;  . TOTAL KNEE ARTHROPLASTY Right 04/20/2014   Procedure: RIGHT TOTAL KNEE ARTHROPLASTY CONVERTED TO RIGHT KNEE REIMPLANTATION;  Surgeon: Mcarthur Rossetti, MD;  Location: [facility];  Service: Orthopedics;  Laterality: Right;  . TOTAL KNEE ARTHROPLASTY Left 08/23/2015   Procedure: LEFT TOTAL KNEE ARTHROPLASTY;  Surgeon: Mcarthur Rossetti, MD;  Location: [facility];  Service: Orthopedics;  Laterality: Left;  Marland Kitchen VENTRAL HERNIA REPAIR     x2    FAMILY HISTORY: Family History  Problem Relation Age of Onset  . Diabetes Mother   . Hypertension Mother  entire family  . Stroke Father        CVA  . Hyperlipidemia Father        entire family  . Diabetes Father   . Heart disease Father        No details.  Not at an early age.    . Crohn's disease Son   . Irritable bowel syndrome Son     SOCIAL HISTORY: Social History   Socioeconomic History  . Marital status: Married    Spouse name: Not on file  . Number of children: 2  . Years of education: Not on file  . Highest education level: Not on file  Occupational History  . Occupation: housewife    Employer: UNEMPLOYED  Social Needs  . Financial resource strain: Not on file  . Food insecurity:    Worry: Not on file    Inability: Not on file  . Transportation needs:    Medical: Not on file    Non-medical: Not on file  Tobacco Use  . Smoking status: Never Smoker  . Smokeless tobacco: Never Used    Substance and Sexual Activity  . Alcohol use: No  . Drug use: No  . Sexual activity: Yes    Partners: Male  Lifestyle  . Physical activity:    Days per week: Not on file    Minutes per session: Not on file  . Stress: Not on file  Relationships  . Social connections:    Talks on phone: Not on file    Gets together: Not on file    Attends religious service: Not on file    Active member of club or organization: Not on file    Attends meetings of clubs or organizations: Not on file    Relationship status: Not on file  . Intimate partner violence:    Fear of current or ex partner: Not on file    Emotionally abused: Not on file    Physically abused: Not on file    Forced sexual activity: Not on file  Other Topics Concern  . Not on file  Social History Narrative   No exercise secondary to knee pain     PHYSICAL EXAM  There were no vitals filed for this visit. There is no height or weight on file to calculate BMI.  Generalized: Well developed, in no acute distress  Head: normocephalic and atraumatic,. Oropharynx benign  Neck: Supple, no carotid bruits  Cardiac: Regular rate rhythm, no murmur  Musculoskeletal: No deformity   Neurological examination   Mentation: Alert oriented to time, place, history taking. Attention span and concentration appropriate. Recent and remote memory intact.  Follows all commands speech and language fluent.   Cranial nerve II-XII: Fundoscopic exam reveals sharp disc margins.Pupils were equal round reactive to light extraocular movements were full, visual field were full on confrontational test. Facial sensation and strength were normal. hearing was intact to finger rubbing bilaterally. Uvula tongue midline. head turning and shoulder shrug were normal and symmetric.Tongue protrusion into cheek strength was normal. Motor: normal bulk and tone, full strength in the BUE, BLE, fine finger movements normal, no pronator drift. No focal weakness Sensory:  normal and symmetric to light touch, pinprick, and  Vibration, proprioception  Coordination: finger-nose-finger, heel-to-shin bilaterally, no dysmetria Reflexes: Brachioradialis 2/2, biceps 2/2, triceps 2/2, patellar 2/2, Achilles 2/2, plantar responses were flexor bilaterally. Gait and Station: Rising up from seated position without assistance, normal stance,  moderate stride, good arm swing, smooth turning, able to perform  tiptoe, and heel walking without difficulty. Tandem gait is steady  DIAGNOSTIC DATA (LABS, IMAGING, TESTING) - I reviewed patient records, labs, notes, testing and imaging myself where available.  Lab Results  Component Value Date   WBC 5.6 10/18/2017   HGB 10.8 (L) 10/18/2017   HCT 33.1 (L) 10/18/2017   MCV 94.8 10/18/2017   PLT 102 (L) 10/18/2017      Component Value Date/Time   NA 138 10/18/2017 0545   K 3.4 (L) 10/18/2017 0545   CL 105 10/18/2017 0545   CO2 22 10/18/2017 0545   GLUCOSE 119 (H) 10/18/2017 0545   BUN 14 10/18/2017 0545   CREATININE 1.30 (H) 10/18/2017 0545   CALCIUM 9.1 10/18/2017 0545   PROT 6.5 09/21/2017 1835   ALBUMIN 4.4 09/21/2017 1835   AST 27 09/21/2017 1835   ALT 20 09/21/2017 1835   ALKPHOS 33 (L) 09/21/2017 1835   BILITOT 0.4 09/21/2017 1835   GFRNONAA 42 (L) 10/18/2017 0545   GFRAA 49 (L) 10/18/2017 0545   Lab Results  Component Value Date   CHOL 131 10/17/2017   HDL 26 (L) 10/17/2017   LDLCALC 54 10/17/2017   LDLDIRECT 68.0 09/21/2017   TRIG 253 (H) 10/17/2017   CHOLHDL 5.0 10/17/2017   Lab Results  Component Value Date   HGBA1C 6.0 (H) 10/16/2017   No results found for: [id number] Lab Results  Component Value Date   TSH 2.863 10/16/2017      ASSESSMENT AND PLAN     67 y.o. African American female with PMH of rheumatic fever, hypertension (inconsistently takes her blood pressure medicine), diabetes mellitus and hyperlipidemia admitted on 03/14/2017 for acute infarct along the right lateral motor strength.  MRA head and a CT neck negative. EF 55-60%. TEE positive PFO but otherwise normal, LE venous Doppler negative for DVT. Loop recorder placed. LDL 45 and A1c 5.4. She was discharged with aspirin 325 and continue the Crestor and fenofibrate. Has HH PT/OT and left hand dexterity difficulty resolved. Currently walk with walker. Loop recorder so far no A. fib.  Plan:  - continue ASA and crestor for stroke prevention  - continue loop recorder monitoring. - check BP and glucose at home and record. - Follow up with your primary care physician for stroke risk factor modification. Recommend maintain blood pressure goal <130/80, diabetes with hemoglobin A1c goal below 7.0% and lipids with LDL cholesterol goal below 70 mg/dL.  - healthy diet and regular exercise - follow up in 4 months.    Dennie Bible, [facility], [facility], APRN  [facility] [address], [place], [place] [address] [phone]

## 2017-12-07 ENCOUNTER — Telehealth (INDEPENDENT_AMBULATORY_CARE_PROVIDER_SITE_OTHER): Payer: Self-pay | Admitting: Orthopaedic Surgery

## 2017-12-07 NOTE — Telephone Encounter (Signed)
Patient called to request a refill for muscle relaxer-she doesn't know name of meds.  Please call patient to advise

## 2017-12-07 NOTE — Telephone Encounter (Signed)
Please advise, she wants Rx for muscle relaxer

## 2017-12-08 ENCOUNTER — Emergency Department (HOSPITAL_COMMUNITY): Payer: BLUE CROSS/BLUE SHIELD

## 2017-12-08 ENCOUNTER — Emergency Department (HOSPITAL_COMMUNITY)
Admission: EM | Admit: 2017-12-08 | Discharge: 2017-12-08 | Disposition: A | Discharge status: Home / Self Care | Payer: BLUE CROSS/BLUE SHIELD | Attending: Emergency Medicine | Admitting: Emergency Medicine

## 2017-12-08 ENCOUNTER — Encounter (HOSPITAL_COMMUNITY): Payer: Self-pay

## 2017-12-08 ENCOUNTER — Other Ambulatory Visit: Payer: Self-pay

## 2017-12-08 DIAGNOSIS — Z7982 Long term (current) use of aspirin: Secondary | ICD-10-CM | POA: Diagnosis not present

## 2017-12-08 DIAGNOSIS — E119 Type 2 diabetes mellitus without complications: Secondary | ICD-10-CM | POA: Insufficient documentation

## 2017-12-08 DIAGNOSIS — I10 Essential (primary) hypertension: Secondary | ICD-10-CM | POA: Insufficient documentation

## 2017-12-08 DIAGNOSIS — R0789 Other chest pain: Secondary | ICD-10-CM | POA: Diagnosis not present

## 2017-12-08 DIAGNOSIS — Z96652 Presence of left artificial knee joint: Secondary | ICD-10-CM | POA: Diagnosis not present

## 2017-12-08 DIAGNOSIS — Z79899 Other long term (current) drug therapy: Secondary | ICD-10-CM | POA: Diagnosis not present

## 2017-12-08 DIAGNOSIS — R110 Nausea: Secondary | ICD-10-CM | POA: Insufficient documentation

## 2017-12-08 DIAGNOSIS — Z7984 Long term (current) use of oral hypoglycemic drugs: Secondary | ICD-10-CM | POA: Diagnosis not present

## 2017-12-08 DIAGNOSIS — I252 Old myocardial infarction: Secondary | ICD-10-CM | POA: Insufficient documentation

## 2017-12-08 DIAGNOSIS — Z96651 Presence of right artificial knee joint: Secondary | ICD-10-CM | POA: Insufficient documentation

## 2017-12-08 DIAGNOSIS — Z8673 Personal history of transient ischemic attack (TIA), and cerebral infarction without residual deficits: Secondary | ICD-10-CM | POA: Insufficient documentation

## 2017-12-08 DIAGNOSIS — R079 Chest pain, unspecified: Secondary | ICD-10-CM | POA: Diagnosis not present

## 2017-12-08 LAB — CBC
HEMATOCRIT: 39 % (ref 36.0–46.0)
Hemoglobin: 12.6 g/dL (ref 12.0–15.0)
MCH: 30.7 pg (ref 26.0–34.0)
MCHC: 32.3 g/dL (ref 30.0–36.0)
MCV: 95.1 fL (ref 78.0–100.0)
Platelets: 126 10*3/uL — ABNORMAL LOW (ref 150–400)
RBC: 4.1 MIL/uL (ref 3.87–5.11)
RDW: 13.8 % (ref 11.5–15.5)
WBC: 6 10*3/uL (ref 4.0–10.5)

## 2017-12-08 LAB — BASIC METABOLIC PANEL
Anion gap: 9 (ref 5–15)
BUN: 18 mg/dL (ref 6–20)
CHLORIDE: 107 mmol/L (ref 101–111)
CO2: 22 mmol/L (ref 22–32)
Calcium: 9.9 mg/dL (ref 8.9–10.3)
Creatinine, Ser: 1.42 mg/dL — ABNORMAL HIGH (ref 0.44–1.00)
GFR calc non Af Amer: 38 mL/min — ABNORMAL LOW (ref >60)
GFR, EST AFRICAN AMERICAN: 44 mL/min — AB (ref >60)
Glucose, Bld: 196 mg/dL — ABNORMAL HIGH (ref 65–99)
POTASSIUM: 4.5 mmol/L (ref 3.5–5.1)
SODIUM: 138 mmol/L (ref 135–145)

## 2017-12-08 LAB — HEPATIC FUNCTION PANEL
ALT: 29 U/L (ref 14–54)
AST: 45 U/L — AB (ref 15–41)
Albumin: 4 g/dL (ref 3.5–5.0)
Alkaline Phosphatase: 38 U/L (ref 38–126)
Bilirubin, Direct: <0.1 mg/dL — ABNORMAL LOW (ref 0.1–0.5)
TOTAL PROTEIN: 6.2 g/dL — AB (ref 6.5–8.1)
Total Bilirubin: 0.6 mg/dL (ref 0.3–1.2)

## 2017-12-08 LAB — I-STAT TROPONIN, ED
TROPONIN I, POC: 0 ng/mL (ref 0.00–0.08)
Troponin i, poc: 0 ng/mL (ref 0.00–0.08)

## 2017-12-08 LAB — LIPASE, BLOOD: Lipase: 46 U/L (ref 11–51)

## 2017-12-08 MED ORDER — METHOCARBAMOL 500 MG PO TABS: 500.0000 mg | ORAL_TABLET | Freq: Three times a day (TID) | ORAL | 0 refills | Status: DC | PRN

## 2017-12-08 NOTE — ED Triage Notes (Signed)
Patient complains of cp and feeling bad x 2 days. Reports diarrhea this am and complains of fatigue. Patient alert and oriented, nad

## 2017-12-08 NOTE — ED Notes (Signed)
ED Provider at bedside. 

## 2017-12-08 NOTE — Discharge Instructions (Addendum)
YOU MAY TAKE NITROGLYCERIN IF YOU HAVE CHEST PAIN. BE AWARE THAT IT DROPS YOUR BLOOD PRESSURE SO STAY SEATED UNTIL YOU SEE HOW IT AFFECTS YOU. FOLLOW UP WITH THE CARDIOLOGY CLINIC. RETURN TO ER IF YOU HAVE ANY SEVERE PERSISTENT CHEST PAIN, WORSENING SHORTNESS OF BREATH, PASSING OUT, OR SUDDEN CHANGE IN SYMPTOMS.

## 2017-12-08 NOTE — ED Provider Notes (Signed)
[facility] EMERGENCY DEPARTMENT Provider Note   CSN: [id number] Arrival date & time: 12/08/17  1252     History   Chief Complaint No chief complaint on file.   HPI Rachel Black is a 67 y.o. female.  67 year old female with past medical history including CAD status post recent end STEMI, type 2 diabetes mellitus, CVA, hypertension, hyperlipidemia who presents with chest pain.  For the past 2 days, the patient has had intermittent central, nonradiating chest pain that she thinks may sometimes be worse with exertion.  No significant shortness of breath.  She had some nausea with the chest pain this morning.  She denies any diaphoresis or vomiting.  No fevers, cough/cold symptoms, or urinary symptoms.  She states that she has just felt "off."  She had some diarrhea today but notes that she intermittently has diarrhea since having her gallbladder removed.  The history is provided by the patient.    Past Medical History:  Diagnosis Date  . Arthritis    PAIN AND OA BOTH KNEES AND SHOULDERS AND ELBOWS AND WRIST  . Blood transfusion without reported diagnosis 2018   after cholecystectomy  . Diabetes mellitus    ORAL MEDICATION  . GERD (gastroesophageal reflux disease)   . Gout    NO RECENT FLARE UPS  . H/O: rheumatic fever    AS A CHILD - NO KNOWN HEART MURMUR OR HEART PROBLEMS  . Hyperlipidemia   . Hypertension   . Stroke [facility])     Patient Active Problem List   Diagnosis Date Noted  . NSTEMI (non-ST elevated myocardial infarction) ([place])   . Chest pain 10/16/2017  . Need for shingles vaccine 09/21/2017  . Need for pneumococcal vaccine 09/21/2017  . History of rheumatic fever 06/30/2017  . PFO (patent foramen ovale)   . Cerebrovascular accident (CVA) due to embolism of right middle cerebral artery ([place]) 03/14/2017  . Dilation of biliary tract   . Elevated LFTs   . RUQ abdominal pain   . Acute gallstone pancreatitis 12/07/2016  . Choledocholithiasis  with obstruction 12/07/2016  . AKI (acute kidney injury) ([place]) 12/07/2016  . Osteoarthritis of right knee 08/23/2015  . Status post total left knee replacement 08/23/2015  . Arthritis of knee, right 04/20/2014  . Morbid obesity ([place]) 04/20/2014  . Status post total right knee replacement 04/20/2014  . [place]ushing reaction 03/10/2012  . Diabetes mellitus ([place]) 02/17/2007  . Mixed hyperlipidemia 02/17/2007  . GOUT 02/17/2007  . Severe obesity (BMI >= 40) ([place]) 02/17/2007  . Essential hypertension 02/17/2007  . RHEUMATIC FEVER, HX OF 02/17/2007    Past Surgical History:  Procedure Laterality Date  . ABDOMINAL HYSTERECTOMY    . CESAREAN SECTION    . CHOLECYSTECTOMY N/A 12/09/2016   Procedure: LAPAROSCOPIC CHOLECYSTECTOMY WITH INTRAOPERATIVE CHOLANGIOGRAM;  Surgeon: Stark Klein, MD;  Location: [facility];  Service: General;  Laterality: N/A;  . COLONOSCOPY WITH PROPOFOL N/A 01/30/2013   Procedure: COLONOSCOPY WITH PROPOFOL;  Surgeon: Irene Shipper, MD;  Location: [facility];  Service: Endoscopy;  Laterality: N/A;  . ERCP N/A 12/08/2016   Procedure: ENDOSCOPIC RETROGRADE CHOLANGIOPANCREATOGRAPHY (ERCP);  Surgeon: Ladene Artist, MD;  Location: [facility];  Service: Endoscopy;  Laterality: N/A;  . ESOPHAGOGASTRODUODENOSCOPY (EGD) WITH PROPOFOL N/A 01/30/2013   Procedure: ESOPHAGOGASTRODUODENOSCOPY (EGD) WITH PROPOFOL;  Surgeon: Irene Shipper, MD;  Location: [facility];  Service: Endoscopy;  Laterality: N/A;  . LEFT HEART CATH AND CORONARY ANGIOGRAPHY N/A 10/18/2017   Procedure: LEFT HEART CATH AND CORONARY  ANGIOGRAPHY;  Surgeon: Martinique, Peter M, MD;  Location: [facility];  Service: Cardiovascular;  Laterality: N/A;  . LOOP RECORDER INSERTION N/A 03/18/2017   Procedure: Loop Recorder Insertion;  Surgeon: Constance Haw, MD;  Location: [facility];  Service: Cardiovascular;  Laterality: N/A;  . PARTIAL HYSTERECTOMY    . TEE WITHOUT CARDIOVERSION N/A 03/16/2017   Procedure:  TRANSESOPHAGEAL ECHOCARDIOGRAM (TEE);  Surgeon: Acie Fredrickson Wonda Cheng, MD;  Location: [facility] ENDOSCOPY;  Service: Cardiovascular;  Laterality: N/A;  . TOTAL KNEE ARTHROPLASTY Right 04/20/2014   Procedure: RIGHT TOTAL KNEE ARTHROPLASTY CONVERTED TO RIGHT KNEE REIMPLANTATION;  Surgeon: Mcarthur Rossetti, MD;  Location: [facility];  Service: Orthopedics;  Laterality: Right;  . TOTAL KNEE ARTHROPLASTY Left 08/23/2015   Procedure: LEFT TOTAL KNEE ARTHROPLASTY;  Surgeon: Mcarthur Rossetti, MD;  Location: [facility];  Service: Orthopedics;  Laterality: Left;  Marland Kitchen VENTRAL HERNIA REPAIR     x2     OB History   None      Home Medications    Prior to Admission medications   Medication Sig Start Date End Date Taking? Authorizing Provider  allopurinol (ZYLOPRIM) 300 MG tablet Take 1 tablet (300 mg total) by mouth 2 (two) times daily. 09/21/17  Yes Ann Held, DO  aspirin 81 MG EC tablet Take 1 tablet (81 mg total) by mouth daily. 10/18/17  Yes Cheryln Manly, NP  Calcium Carb-Cholecalciferol (CALCIUM 600-D PO) Take 1 tablet by mouth 2 (two) times daily before a meal.   Yes [provider]  Calcium Carbonate Antacid (TUMS PO) Take 2 tablets by mouth 4 (four) times daily as needed (acid reflux).   Yes [provider]  dexlansoprazole (DEXILANT) 60 MG capsule TAKE 1 CAPSULE (60 MG TOTAL) BY MOUTH EVERY MORNING Patient taking differently: Take 60 mg by mouth daily before breakfast.  07/06/17  Yes Irene Shipper, MD  Emollient (UDDERLY SMOOTH) CREA Apply 1 application topically See admin instructions. Apply topically to skin folds as needed for rash/irritation   Yes [provider]  fenofibrate 160 MG tablet Take 1 tablet (160 mg total) by mouth daily. 09/21/17  Yes Roma Schanz R, DO  ferrous sulfate 325 (65 FE) MG EC tablet Take 1 tablet (325 mg total) by mouth 2 (two) times daily. Patient taking differently: Take 325 mg by mouth 2 (two) times daily before a meal.  07/06/17   Yes Irene Shipper, MD  glucose blood (ACCU-CHEK AVIVA PLUS) test strip Use as instructed once a day.  DX Code E11.9 09/21/17  Yes Roma Schanz R, DO  potassium chloride SA (K-DUR,KLOR-CON) 20 MEQ tablet Take 1 tablet (20 mEq total) by mouth daily. Take as instructed per provider 10/26/17  Yes Barrett, Evelene Croon, PA-C  rosuvastatin (CRESTOR) 40 MG tablet Take 1 tablet (40 mg total) by mouth daily. 09/27/17  Yes Roma Schanz R, DO  senna-docusate (SENOKOT-S) 8.6-50 MG tablet Take 1 tablet by mouth at bedtime as needed for mild constipation. 03/17/17  Yes Reyne Dumas, MD  SitaGLIPtin-MetFORMIN HCl (JANUMET XR) [phone] MG TB24 Take 1 tablet by mouth daily. Patient taking differently: Take 1 tablet by mouth every morning.  09/21/17  Yes Roma Schanz R, DO  valsartan-hydrochlorothiazide (DIOVAN-HCT) 80-12.5 MG tablet Take 1 tablet by mouth daily. 09/21/17  Yes Roma Schanz R, DO  ACCU-CHEK SOFTCLIX LANCETS lancets Use as instructed once a day.  DX Code: E11.9 09/21/17   Ann Held, DO  furosemide (LASIX) 20 MG tablet Take 1 tablet (20 mg total) by mouth daily. Patient not taking: Reported on 12/08/2017 10/27/17 01/25/18  Barrett, Evelene Croon, PA-C  methocarbamol (ROBAXIN) 500 MG tablet Take 1 tablet (500 mg total) by mouth every 8 (eight) hours as needed for muscle spasms. Patient not taking: Reported on 12/08/2017 12/08/17   Mcarthur Rossetti, MD  nitroGLYCERIN (NITROSTAT) 0.4 MG SL tablet Place 1 tablet (0.4 mg total) under the tongue every 5 (five) minutes x 3 doses as needed for chest pain. 10/18/17   Cheryln Manly, NP    Family History Family History  Problem Relation Age of Onset  . Diabetes Mother   . Hypertension Mother        entire family  . Stroke Father        CVA  . Hyperlipidemia Father        entire family  . Diabetes Father   . Heart disease Father        No details.  Not at an early age.    . Crohn's disease Son   . Irritable bowel syndrome Son       Social History Social History   Tobacco Use  . Smoking status: Never Smoker  . Smokeless tobacco: Never Used  Substance Use Topics  . Alcohol use: No  . Drug use: No     Allergies   Other and Penicillins   Review of Systems Review of Systems All other systems reviewed and are negative except that which was mentioned in HPI   Physical Exam Updated Vital Signs BP (!) 111/59   Pulse 63   Temp 98.4 F (36.9 C) (Oral)   Resp 19   SpO2 99%   Physical Exam  Constitutional: She is oriented to person, place, and time. She appears well-developed and well-nourished. No distress.  HENT:  Head: Normocephalic and atraumatic.  Moist mucous membranes  Eyes: Conjunctivae are normal.  Neck: Neck supple.  Cardiovascular: Normal rate, regular rhythm and normal heart sounds.  No murmur heard. Pulmonary/Chest: Effort normal and breath sounds normal.  Abdominal: Soft. Bowel sounds are normal. She exhibits no distension. There is no tenderness.  Musculoskeletal: She exhibits no edema.  Neurological: She is alert and oriented to person, place, and time.  Fluent speech  Skin: Skin is warm and dry.  Psychiatric: She has a normal mood and affect. Judgment normal.  Nursing note and vitals reviewed.    ED Treatments / Results  Labs (all labs ordered are listed, but only abnormal results are displayed) Labs Reviewed  BASIC METABOLIC PANEL - Abnormal; Notable for the following components:      Result Value   Glucose, Bld 196 (*)    Creatinine, Ser 1.42 (*)    GFR calc non Af Amer 38 (*)    GFR calc Af Amer 44 (*)    All other components within normal limits  CBC - Abnormal; Notable for the following components:   Platelets 126 (*)    All other components within normal limits  HEPATIC FUNCTION PANEL - Abnormal; Notable for the following components:   Total Protein 6.2 (*)    AST 45 (*)    Bilirubin, Direct <0.1 (*)    All other components within normal limits  LIPASE, BLOOD   I-STAT TROPONIN, ED  I-STAT TROPONIN, ED    EKG None  Radiology Dg Chest 2 View  Result Date: 12/08/2017 CLINICAL DATA:  Chest pain and fatigue. EXAM: CHEST - 2 VIEW COMPARISON:  Chest x-ray dated October 16, 2017. FINDINGS: The heart size and mediastinal contours are within normal limits. Normal pulmonary vascularity. No focal consolidation, pleural effusion, or pneumothorax. No acute osseous abnormality. IMPRESSION: No active cardiopulmonary disease. Electronically Signed   By: Titus Dubin M.D.   On: 12/08/2017 13:34    Procedures Procedures (including critical care time)  Medications Ordered in ED Medications - No data to display   Initial Impression / Assessment and Plan / ED Course  I have reviewed the triage vital signs and the nursing notes.  Pertinent labs & imaging results that were available during my care of the patient were reviewed by me and considered in my medical decision making (see chart for details).     Pt well appearing on exam w/ normal VS. EKG without acute ischemic changes.  Chest x-ray negative.  Lab work reassuring including negative troponin.  I discussed the patient's presentation with cardiology, Dr. Debara Pickett, who reviewed her recent cath and recommended continuing medical therapy for now and f/u in clinic to discuss whether she may benefit from adding imdur.  She has been chest pain-free here and given negative serial troponins I feel she is appropriate for discharge.  I have extensively reviewed return precautions with her.  Final Clinical Impressions(s) / ED Diagnoses   Final diagnoses:  Atypical chest pain    ED Discharge Orders    None       Summerlyn Fickel, Wenda Overland, MD 12/08/17 2150

## 2017-12-10 ENCOUNTER — Ambulatory Visit: Payer: Medicare Other | Admitting: Nurse Practitioner

## 2017-12-16 ENCOUNTER — Ambulatory Visit: Payer: BLUE CROSS/BLUE SHIELD | Admitting: Physician Assistant

## 2017-12-16 ENCOUNTER — Encounter: Payer: Self-pay | Admitting: Physician Assistant

## 2017-12-16 VITALS — BP 115/68 | HR 77 | Ht 66.0 in | Wt 271.0 lb

## 2017-12-16 DIAGNOSIS — I208 Other forms of angina pectoris: Secondary | ICD-10-CM | POA: Diagnosis not present

## 2017-12-16 DIAGNOSIS — K219 Gastro-esophageal reflux disease without esophagitis: Secondary | ICD-10-CM

## 2017-12-16 DIAGNOSIS — R600 Localized edema: Secondary | ICD-10-CM | POA: Diagnosis not present

## 2017-12-16 DIAGNOSIS — I10 Essential (primary) hypertension: Secondary | ICD-10-CM

## 2017-12-16 MED ORDER — ISOSORBIDE MONONITRATE ER 30 MG PO TB24: 30.0000 mg | ORAL_TABLET | Freq: Every day | ORAL | 3 refills | Status: DC

## 2017-12-16 NOTE — Patient Instructions (Addendum)
Medication Instructions:  Your physician has recommended you make the following change in your medication:  1) START - Imdur 30 mg - Take one tablet by mouthy ONCE daily   Labwork: none  Testing/Procedures: none  Follow-Up: Keep your scheduled follow-up on June 7th with Dr. Percival Spanish.  Any Other Special Instructions Will Be Listed Below (If Applicable).  Your provider recommends that you schedule a follow-up appointment with Dr. Henrene Pastor - GI Office.    If you need a refill on your cardiac medications before your next appointment, please call your pharmacy.

## 2017-12-16 NOTE — Progress Notes (Signed)
Cardiology Office Note   Date:  12/16/2017   ID:  Rachel Black, DOB [birth year], MRN [id number]  PCP:  Ann Held, DO  Cardiologist:  Dr. Yetta Flock Barrett, PA-C Katina Degree, Student-PA    History of Present Illness: Rachel Black is a 67 y.o. female with a history of ANNASOPHIA CROCKER is a 67 y.o. female with a history of cryptogenic CVA 03/2017 s/p TEE and ILR (no A. fib as yet), PFO on TEE, LHC with 100% occlusion OM 2 branch   Admitted 2/2-10/18/2017 for CP>> non-STEMI, s/p cath and echo below, medical therapy for occluded OM 2 branch.  No beta-blocker given bradycardia, on aspirin and high-dose statin  Of note, patient recently presented to the ED on 3/27 with CP x 2 days, worse with exertion but no significant SOB. BP was stable, EKG without acute ischemic changes. Negative troponin x 2. CXR negative. Dr. Debara Pickett was consulted and recommended continuing medical therapy with follow-up in clinic and possible addition of Imdur.   Rachel Black presents for follow-up cardiology evaluation.   Patient states that she has not had any acute episodes of chest pain since her ED visit on 12/08/17. She reports having a difficult time differentiating if the chest pain is cardiac in nature or related to her acid reflux. Patient states that she has dealt with acid reflex for a long time and experiences the chest discomfort consistently throughout most days. She does not feel that certain foods exacerbate her symptoms, and the addition of OTC Pepcid to her chronic Dexilant have mildly decreased her symptoms. She denies any associated palpitations, lightheadedness, dizziness, or syncope.   Rachel Black reports that her activity is very low, mostly due to limitations in her "joints." She has had bilateral total knee replacements and has a difficult time walking long distances. Endorses shortness of breath with any physical activity. She states that the most activity she gets now is  doing chores around the house. Denies orthopnea (difficult to assess cause she sleeps on 2-3 pillows 2/2/ acid reflux) or PND. Patient reports having chronic lower extremity edema in her ankles, but is at her baseline today. Has never used her Lasix prescription.   Patient has recorded her weights, which are averaging consistently between 274-276 lbs. Average SBP 120-130 and DBP 80-90.   Patient also reports having blurry vision over the past 6 months with occasional floaters, but denies any eye pain, diplopia, or flashing lights. Unsure if one eye is worse than others. Has history of DM type II (A1C 10/16/17 was 6.0%) and has not had an eye exam within the last couple of years.    Past Medical History:  Diagnosis Date  . Arthritis    PAIN AND OA BOTH KNEES AND SHOULDERS AND ELBOWS AND WRIST  . Blood transfusion without reported diagnosis 2018   after cholecystectomy  . Diabetes mellitus    ORAL MEDICATION  . GERD (gastroesophageal reflux disease)   . Gout    NO RECENT FLARE UPS  . H/O: rheumatic fever    AS A CHILD - NO KNOWN HEART MURMUR OR HEART PROBLEMS  . Hyperlipidemia   . Hypertension   . Stroke [facility])     Past Surgical History:  Procedure Laterality Date  . ABDOMINAL HYSTERECTOMY    . CESAREAN SECTION    . CHOLECYSTECTOMY N/A 12/09/2016   Procedure: LAPAROSCOPIC CHOLECYSTECTOMY WITH INTRAOPERATIVE CHOLANGIOGRAM;  Surgeon: Stark Klein, MD;  Location: [facility];  Service:  General;  Laterality: N/A;  . COLONOSCOPY WITH PROPOFOL N/A 01/30/2013   Procedure: COLONOSCOPY WITH PROPOFOL;  Surgeon: Irene Shipper, MD;  Location: [facility];  Service: Endoscopy;  Laterality: N/A;  . ERCP N/A 12/08/2016   Procedure: ENDOSCOPIC RETROGRADE CHOLANGIOPANCREATOGRAPHY (ERCP);  Surgeon: Ladene Artist, MD;  Location: [facility];  Service: Endoscopy;  Laterality: N/A;  . ESOPHAGOGASTRODUODENOSCOPY (EGD) WITH PROPOFOL N/A 01/30/2013   Procedure: ESOPHAGOGASTRODUODENOSCOPY (EGD) WITH PROPOFOL;   Surgeon: Irene Shipper, MD;  Location: [facility];  Service: Endoscopy;  Laterality: N/A;  . LEFT HEART CATH AND CORONARY ANGIOGRAPHY N/A 10/18/2017   Procedure: LEFT HEART CATH AND CORONARY ANGIOGRAPHY;  Surgeon: Martinique, Peter M, MD;  Location: [place] [facility];  Service: Cardiovascular;  Laterality: N/A;  . LOOP RECORDER INSERTION N/A 03/18/2017   Procedure: Loop Recorder Insertion;  Surgeon: Constance Haw, MD;  Location: [place] [facility];  Service: Cardiovascular;  Laterality: N/A;  . PARTIAL HYSTERECTOMY    . TEE WITHOUT CARDIOVERSION N/A 03/16/2017   Procedure: TRANSESOPHAGEAL ECHOCARDIOGRAM (TEE);  Surgeon: Acie Fredrickson Wonda Cheng, MD;  Location: [facility] ENDOSCOPY;  Service: Cardiovascular;  Laterality: N/A;  . TOTAL KNEE ARTHROPLASTY Right 04/20/2014   Procedure: RIGHT TOTAL KNEE ARTHROPLASTY CONVERTED TO RIGHT KNEE REIMPLANTATION;  Surgeon: Mcarthur Rossetti, MD;  Location: [facility];  Service: Orthopedics;  Laterality: Right;  . TOTAL KNEE ARTHROPLASTY Left 08/23/2015   Procedure: LEFT TOTAL KNEE ARTHROPLASTY;  Surgeon: Mcarthur Rossetti, MD;  Location: [facility];  Service: Orthopedics;  Laterality: Left;  Marland Kitchen VENTRAL HERNIA REPAIR     x2    Current Outpatient Medications  Medication Sig Dispense Refill  . ACCU-CHEK SOFTCLIX LANCETS lancets Use as instructed once a day.  DX Code: E11.9 100 each 1  . allopurinol (ZYLOPRIM) 300 MG tablet Take 1 tablet (300 mg total) by mouth 2 (two) times daily. 180 tablet 3  . aspirin 81 MG EC tablet Take 1 tablet (81 mg total) by mouth daily.    . Calcium Carb-Cholecalciferol (CALCIUM 600-D PO) Take 1 tablet by mouth 2 (two) times daily before a meal.    . Calcium Carbonate Antacid (TUMS PO) Take 2 tablets by mouth 4 (four) times daily as needed (acid reflux).    Marland Kitchen dexlansoprazole (DEXILANT) 60 MG capsule TAKE 1 CAPSULE (60 MG TOTAL) BY MOUTH EVERY MORNING (Patient taking differently: Take 60 mg by mouth daily before breakfast. ) 90 capsule 3  . Emollient  (UDDERLY SMOOTH) CREA Apply 1 application topically See admin instructions. Apply topically to skin folds as needed for rash/irritation    . fenofibrate 160 MG tablet Take 1 tablet (160 mg total) by mouth daily. 90 tablet 1  . ferrous sulfate 325 (65 FE) MG EC tablet Take 1 tablet (325 mg total) by mouth 2 (two) times daily. (Patient taking differently: Take 325 mg by mouth 2 (two) times daily before a meal. ) 60 tablet 11  . furosemide (LASIX) 20 MG tablet Take 1 tablet (20 mg total) by mouth daily. (Patient not taking: Reported on 12/08/2017) 30 tablet 3  . glucose blood (ACCU-CHEK AVIVA PLUS) test strip Use as instructed once a day.  DX Code E11.9 100 each 1  . methocarbamol (ROBAXIN) 500 MG tablet Take 1 tablet (500 mg total) by mouth every 8 (eight) hours as needed for muscle spasms. (Patient not taking: Reported on 12/08/2017) 60 tablet 0  . nitroGLYCERIN (NITROSTAT) 0.4 MG SL tablet Place 1 tablet (0.4 mg total) under the tongue every 5 (five)  minutes x 3 doses as needed for chest pain. 25 tablet 2  . potassium chloride SA (K-DUR,KLOR-CON) 20 MEQ tablet Take 1 tablet (20 mEq total) by mouth daily. Take as instructed per provider 90 tablet 1  . rosuvastatin (CRESTOR) 40 MG tablet Take 1 tablet (40 mg total) by mouth daily. 90 tablet 0  . senna-docusate (SENOKOT-S) 8.6-50 MG tablet Take 1 tablet by mouth at bedtime as needed for mild constipation. 30 tablet 1  . SitaGLIPtin-MetFORMIN HCl (JANUMET XR) [phone] MG TB24 Take 1 tablet by mouth daily. (Patient taking differently: Take 1 tablet by mouth every morning. ) 90 tablet 1  . valsartan-hydrochlorothiazide (DIOVAN-HCT) 80-12.5 MG tablet Take 1 tablet by mouth daily. 90 tablet 1   No current facility-administered medications for this visit.     Allergies:   Other and Penicillins    Social History:  The patient  reports that she has never smoked. She has never used smokeless tobacco. She reports that she does not drink alcohol or use drugs.    Family History:  The patient's family history includes Crohn's disease in her son; Diabetes in her father and mother; Heart disease in her father; Hyperlipidemia in her father; Hypertension in her mother; Irritable bowel syndrome in her son; Stroke in her father.    ROS:  Please see the history of present illness. All other systems are reviewed and negative.    PHYSICAL EXAM: VS:  BP 115/68   Pulse 77   Ht 5\' 6"  (1.676 m)   Wt 271 lb (122.9 kg)   BMI 43.74 kg/m  , BMI Body mass index is 43.74 kg/m. GEN: Obsese, female sitting in wheelchair in no acute distress HEENT: normal for age  Neck: no JVD, no carotid bruit, no masses Cardiac: RRR; no murmur, no rubs, or gallops Respiratory:  clear to auscultation bilaterally, normal work of breathing GI: soft, nontender, nondistended, + BS MS: no deformity or atrophy; trace pedal edema; distal pulses are 2+ in all 4 extremities  Skin: warm and dry, no rash Neuro:  Strength and sensation are intact Psych: A&O, stressed    EKG:  EKG is not ordered today.  Echo 10/17/17   Study Conclusions  - Left ventricle: The cavity size was normal. There was mild   concentric hypertrophy. Systolic function was normal. The   estimated ejection fraction was in the range of 55% to 60%. Wall   motion was normal; there were no regional wall motion   abnormalities. Doppler parameters are consistent with abnormal   left ventricular relaxation (grade 1 diastolic dysfunction). - Aortic valve: Transvalvular velocity was within the normal range.   There was no stenosis. There was no regurgitation. - Mitral valve: Transvalvular velocity was within the normal range.   There was no evidence for stenosis. There was no regurgitation. - Right ventricle: The cavity size was normal. Wall thickness was   normal. Systolic function was normal. - Atrial septum: No defect or patent foramen ovale was identified   by color flow Doppler. - Tricuspid valve: There was  mild regurgitation. - Pulmonary arteries: Systolic pressure was within the normal   range. PA peak pressure: 21 mm Hg (S).  Left Heart Cath 10/18/17   Lat 2nd Mrg lesion is 100% stenosed.  LV end diastolic pressure is normal. 1. Single vessel occlusive CAD with 100% occlusion of side branch of the second OM 2. Otherwise normal coronary anatomy 3. Normal LVEDP  Plan: medical therapy.  Recent Labs: 10/16/2017: TSH 2.863 10/17/2017:  Magnesium 2.0 12/08/2017: ALT 29; BUN 18; Creatinine, Ser 1.42; Hemoglobin 12.6; Platelets 126; Potassium 4.5; Sodium 138    Lipid Panel    Component Value Date/Time   CHOL 131 10/17/2017 0256   TRIG 253 (H) 10/17/2017 0256   HDL 26 (L) 10/17/2017 0256   CHOLHDL 5.0 10/17/2017 0256   VLDL 51 (H) 10/17/2017 0256   LDLCALC 54 10/17/2017 0256   LDLDIRECT 68.0 09/21/2017 1835     Wt Readings from Last 3 Encounters:  12/16/17 271 lb (122.9 kg)  10/26/17 271 lb 6.4 oz (123.1 kg)  10/18/17 265 lb 11.2 oz (120.5 kg)     Other studies Reviewed: Additional studies/ records that were reviewed today include: outside & hospital records and labs   ASSESSMENT AND PLAN:  1.  Chest pain, hx of NSTEMI and CAD s/p cath 10/18/17, medical therapy for occluded OM 2 branch>>Stable angina - Complains of continuous chest pain most days that is similar to her acid reflux; no acute episodes since ED visit 12/08/17  - 2D Echo 10/17/17: EF 55-60%, G1DD, normal wall motion, mild TR - Continue medical therapy: ASA, high-dose statin, and Valsartan-HCTZ  - Start long-acting nitrate, Imdur 30 mg PO QD to see if this improves symptoms  - Have patient continue to record BP at home and call if intolerant to new medication   2. GERD - Hx of GERD; will continue OTC Pepcid and Dexilant - Per the patient, her GERD sx are not well-controlled - recommend follow-up appt with Dr. Henrene Pastor - GI Office  - will follow-up with Dr. Percival Spanish, next scheduled appt on 02/18/18  3. HTN - BP stable  today, 115/68  - At home, she reports SBP averaging 120-130 and DBP 80 - Continue current medication regimen   3. Chronic LEE - pressures inside heart were normal with cath and echo 2/19 - reports weight consistently between 274-276 lbs - feels that her LE edema is at baseline now & does not appear fluid overloaded on exam  - has never taken prescription of Lasix - Will continue to Valsartan-HCTZ  - states that Robaxin has improved leg cramping  Current medicines are reviewed at length with the patient today.  The patient does not have concerns regarding medicines.  The following changes have been made: Start Imdur 30 mg PO daily   Labs/ tests ordered today include:  No orders of the defined types were placed in this encounter.   Disposition:   FU with Dr Percival Spanish  Signed, Rosaria Ferries, PA-C  12/16/2017 1:56 PM    [place] Phone: [phone]; Fax: [phone]  This note was written with the assistance of speech recognition software.  Please excuse any transcriptional errors.

## 2017-12-20 ENCOUNTER — Ambulatory Visit (INDEPENDENT_AMBULATORY_CARE_PROVIDER_SITE_OTHER): Payer: Medicare Other | Admitting: *Deleted

## 2017-12-20 DIAGNOSIS — I639 Cerebral infarction, unspecified: Secondary | ICD-10-CM | POA: Diagnosis not present

## 2017-12-21 ENCOUNTER — Encounter: Payer: Self-pay | Admitting: Family Medicine

## 2017-12-21 ENCOUNTER — Ambulatory Visit (INDEPENDENT_AMBULATORY_CARE_PROVIDER_SITE_OTHER): Payer: BLUE CROSS/BLUE SHIELD | Admitting: Family Medicine

## 2017-12-21 ENCOUNTER — Other Ambulatory Visit: Payer: Self-pay | Admitting: Family Medicine

## 2017-12-21 VITALS — BP 110/66 | HR 80 | Temp 98.3°F | Resp 16 | Ht 66.0 in | Wt 272.4 lb

## 2017-12-21 DIAGNOSIS — Z23 Encounter for immunization: Secondary | ICD-10-CM

## 2017-12-21 DIAGNOSIS — I63411 Cerebral infarction due to embolism of right middle cerebral artery: Secondary | ICD-10-CM

## 2017-12-21 DIAGNOSIS — E2839 Other primary ovarian failure: Secondary | ICD-10-CM | POA: Diagnosis not present

## 2017-12-21 DIAGNOSIS — E1122 Type 2 diabetes mellitus with diabetic chronic kidney disease: Secondary | ICD-10-CM | POA: Diagnosis not present

## 2017-12-21 DIAGNOSIS — E1151 Type 2 diabetes mellitus with diabetic peripheral angiopathy without gangrene: Secondary | ICD-10-CM

## 2017-12-21 DIAGNOSIS — I214 Non-ST elevation (NSTEMI) myocardial infarction: Secondary | ICD-10-CM | POA: Diagnosis not present

## 2017-12-21 DIAGNOSIS — IMO0002 Reserved for concepts with insufficient information to code with codable children: Secondary | ICD-10-CM

## 2017-12-21 DIAGNOSIS — E1169 Type 2 diabetes mellitus with other specified complication: Secondary | ICD-10-CM | POA: Diagnosis not present

## 2017-12-21 DIAGNOSIS — I10 Essential (primary) hypertension: Secondary | ICD-10-CM

## 2017-12-21 DIAGNOSIS — E1165 Type 2 diabetes mellitus with hyperglycemia: Secondary | ICD-10-CM | POA: Diagnosis not present

## 2017-12-21 DIAGNOSIS — E785 Hyperlipidemia, unspecified: Secondary | ICD-10-CM | POA: Diagnosis not present

## 2017-12-21 DIAGNOSIS — Z1231 Encounter for screening mammogram for malignant neoplasm of breast: Secondary | ICD-10-CM

## 2017-12-21 DIAGNOSIS — Z0000 Encounter for general adult medical examination without abnormal findings: Secondary | ICD-10-CM

## 2017-12-21 DIAGNOSIS — M1000 Idiopathic gout, unspecified site: Secondary | ICD-10-CM | POA: Diagnosis not present

## 2017-12-21 LAB — LIPID PANEL
CHOLESTEROL: 127 mg/dL (ref 0–200)
HDL: 32.5 mg/dL — AB (ref >39.00)
NonHDL: 94.17
TRIGLYCERIDES: 299 mg/dL — AB (ref 0.0–149.0)
Total CHOL/HDL Ratio: 4
VLDL: 59.8 mg/dL — AB (ref 0.0–40.0)

## 2017-12-21 LAB — COMPREHENSIVE METABOLIC PANEL
ALBUMIN: 4.3 g/dL (ref 3.5–5.2)
ALK PHOS: 34 U/L — AB (ref 39–117)
ALT: 21 U/L (ref 0–35)
AST: 24 U/L (ref 0–37)
BUN: 22 mg/dL (ref 6–23)
CALCIUM: 10.2 mg/dL (ref 8.4–10.5)
CO2: 28 mEq/L (ref 19–32)
Chloride: 105 mEq/L (ref 96–112)
Creatinine, Ser: 1.42 mg/dL — ABNORMAL HIGH (ref 0.40–1.20)
GFR: 39.4 mL/min — AB (ref >60.00)
Glucose, Bld: 146 mg/dL — ABNORMAL HIGH (ref 70–99)
Potassium: 5.1 mEq/L (ref 3.5–5.1)
Sodium: 141 mEq/L (ref 135–145)
TOTAL PROTEIN: 6.7 g/dL (ref 6.0–8.3)
Total Bilirubin: 0.4 mg/dL (ref 0.2–1.2)

## 2017-12-21 LAB — MICROALBUMIN / CREATININE URINE RATIO
Creatinine,U: 116.4 mg/dL
MICROALB UR: 1.6 mg/dL (ref 0.0–1.9)
Microalb Creat Ratio: 1.4 mg/g (ref 0.0–30.0)

## 2017-12-21 LAB — CBC WITH DIFFERENTIAL/PLATELET
Basophils Absolute: 0 10*3/uL (ref 0.0–0.1)
Basophils Relative: 0.5 % (ref 0.0–3.0)
EOS PCT: 0 % (ref 0.0–5.0)
Eosinophils Absolute: 0 10*3/uL (ref 0.0–0.7)
HCT: 38 % (ref 36.0–46.0)
Hemoglobin: 12.5 g/dL (ref 12.0–15.0)
LYMPHS ABS: 1.7 10*3/uL (ref 0.7–4.0)
Lymphocytes Relative: 24.8 % (ref 12.0–46.0)
MCHC: 33 g/dL (ref 30.0–36.0)
MCV: 94.4 fl (ref 78.0–100.0)
MONOS PCT: 4.2 % (ref 3.0–12.0)
Monocytes Absolute: 0.3 10*3/uL (ref 0.1–1.0)
Neutro Abs: 4.8 10*3/uL (ref 1.4–7.7)
Neutrophils Relative %: 70.5 % (ref 43.0–77.0)
Platelets: 134 10*3/uL — ABNORMAL LOW (ref 150.0–400.0)
RBC: 4.02 Mil/uL (ref 3.87–5.11)
RDW: 14.1 % (ref 11.5–15.5)
WBC: 6.8 10*3/uL (ref 4.0–10.5)

## 2017-12-21 LAB — URIC ACID: Uric Acid, Serum: 3.5 mg/dL (ref 2.4–7.0)

## 2017-12-21 LAB — HEMOGLOBIN A1C: Hgb A1c MFr Bld: 6 % (ref 4.6–6.5)

## 2017-12-21 LAB — LDL CHOLESTEROL, DIRECT: LDL DIRECT: 50 mg/dL

## 2017-12-21 MED ORDER — SITAGLIP PHOS-METFORMIN HCL ER 100-1000 MG PO TB24: 1.0000 | ORAL_TABLET | Freq: Every day | ORAL | 1 refills | Status: DC

## 2017-12-21 MED ORDER — ALLOPURINOL 300 MG PO TABS: 300.0000 mg | ORAL_TABLET | Freq: Two times a day (BID) | ORAL | 3 refills | Status: DC

## 2017-12-21 MED ORDER — ROSUVASTATIN CALCIUM 40 MG PO TABS: 40.0000 mg | ORAL_TABLET | Freq: Every day | ORAL | 1 refills | Status: DC

## 2017-12-21 NOTE — Assessment & Plan Note (Signed)
Stable

## 2017-12-21 NOTE — Assessment & Plan Note (Signed)
Per cardiology 

## 2017-12-21 NOTE — Assessment & Plan Note (Signed)
Pt given infor for healthy weight and wellness

## 2017-12-21 NOTE — Assessment & Plan Note (Signed)
>>  ASSESSMENT AND PLAN FOR CEREBROVASCULAR ACCIDENT (CVA) DUE TO EMBOLISM OF RIGHT MIDDLE CEREBRAL ARTERY (HCC) WRITTEN ON 12/21/2017  9:50 PM BY LOWNE CHASE, YVONNE R, DO  Stable

## 2017-12-21 NOTE — Patient Instructions (Signed)
Preventive Care 40-64 Years, Female Preventive care refers to lifestyle choices and visits with your health care provider that can promote health and wellness. What does preventive care include?  A yearly physical exam. This is also called an annual well check.  Dental exams once or twice a year.  Routine eye exams. Ask your health care provider how often you should have your eyes checked.  Personal lifestyle choices, including: ? Daily care of your teeth and gums. ? Regular physical activity. ? Eating a healthy diet. ? Avoiding tobacco and drug use. ? Limiting alcohol use. ? Practicing safe sex. ? Taking low-dose aspirin daily starting at age 58. ? Taking vitamin and mineral supplements as recommended by your health care provider. What happens during an annual well check? The services and screenings done by your health care provider during your annual well check will depend on your age, overall health, lifestyle risk factors, and family history of disease. Counseling Your health care provider may ask you questions about your:  Alcohol use.  Tobacco use.  Drug use.  Emotional well-being.  Home and relationship well-being.  Sexual activity.  Eating habits.  Work and work Statistician.  Method of birth control.  Menstrual cycle.  Pregnancy history.  Screening You may have the following tests or measurements:  Height, weight, and BMI.  Blood pressure.  Lipid and cholesterol levels. These may be checked every 5 years, or more frequently if you are over 81 years old.  Skin check.  Lung cancer screening. You may have this screening every year starting at age 78 if you have a 30-pack-year history of smoking and currently smoke or have quit within the past 15 years.  Fecal occult blood test (FOBT) of the stool. You may have this test every year starting at age 65.  Flexible sigmoidoscopy or colonoscopy. You may have a sigmoidoscopy every 5 years or a colonoscopy  every 10 years starting at age 30.  Hepatitis C blood test.  Hepatitis B blood test.  Sexually transmitted disease (STD) testing.  Diabetes screening. This is done by checking your blood sugar (glucose) after you have not eaten for a while (fasting). You may have this done every 1-3 years.  Mammogram. This may be done every 1-2 years. Talk to your health care provider about when you should start having regular mammograms. This may depend on whether you have a family history of breast cancer.  BRCA-related cancer screening. This may be done if you have a family history of breast, ovarian, tubal, or peritoneal cancers.  Pelvic exam and Pap test. This may be done every 3 years starting at age 80. Starting at age 36, this may be done every 5 years if you have a Pap test in combination with an HPV test.  Bone density scan. This is done to screen for osteoporosis. You may have this scan if you are at high risk for osteoporosis.  Discuss your test results, treatment options, and if necessary, the need for more tests with your health care provider. Vaccines Your health care provider may recommend certain vaccines, such as:  Influenza vaccine. This is recommended every year.  Tetanus, diphtheria, and acellular pertussis (Tdap, Td) vaccine. You may need a Td booster every 10 years.  Varicella vaccine. You may need this if you have not been vaccinated.  Zoster vaccine. You may need this after age 5.  Measles, mumps, and rubella (MMR) vaccine. You may need at least one dose of MMR if you were born in  1956 or later. You may also need a second dose.  Pneumococcal 13-valent conjugate (PCV13) vaccine. You may need this if you have certain conditions and were not previously vaccinated.  Pneumococcal polysaccharide (PPSV23) vaccine. You may need one or two doses if you smoke cigarettes or if you have certain conditions.  Meningococcal vaccine. You may need this if you have certain  conditions.  Hepatitis A vaccine. You may need this if you have certain conditions or if you travel or work in places where you may be exposed to hepatitis A.  Hepatitis B vaccine. You may need this if you have certain conditions or if you travel or work in places where you may be exposed to hepatitis B.  Haemophilus influenzae type b (Hib) vaccine. You may need this if you have certain conditions.  Talk to your health care provider about which screenings and vaccines you need and how often you need them. This information is not intended to replace advice given to you by your health care provider. Make sure you discuss any questions you have with your health care provider. Document Released: 09/27/2015 Document Revised: 05/20/2016 Document Reviewed: 07/02/2015 Elsevier Interactive Patient Education  2018 [employer]  

## 2017-12-21 NOTE — Progress Notes (Addendum)
Subjective:     Rachel Black is a 67 y.o. female and is here for a comprehensive physical exam. The patient reports no new problems.   .pt still has weakness in the legs and balance issues.  She has some numbness in L hand    Social History   Socioeconomic History  . Marital status: Married    Spouse name: Not on file  . Number of children: 2  . Years of education: Not on file  . Highest education level: Not on file  Occupational History  . Occupation: housewife    Employer: UNEMPLOYED  Social Needs  . Financial resource strain: Not on file  . Food insecurity:    Worry: Not on file    Inability: Not on file  . Transportation needs:    Medical: Not on file    Non-medical: Not on file  Tobacco Use  . Smoking status: Never Smoker  . Smokeless tobacco: Never Used  Substance and Sexual Activity  . Alcohol use: No  . Drug use: No  . Sexual activity: Yes    Partners: Male  Lifestyle  . Physical activity:    Days per week: Not on file    Minutes per session: Not on file  . Stress: Not on file  Relationships  . Social connections:    Talks on phone: Not on file    Gets together: Not on file    Attends religious service: Not on file    Active member of club or organization: Not on file    Attends meetings of clubs or organizations: Not on file    Relationship status: Not on file  . Intimate partner violence:    Fear of current or ex partner: Not on file    Emotionally abused: Not on file    Physically abused: Not on file    Forced sexual activity: Not on file  Other Topics Concern  . Not on file  Social History Narrative   No exercise secondary to knee pain   Health Maintenance  Topic Date Due  . MAMMOGRAM  10/12/2012  . COLONOSCOPY  01/30/2014  . OPHTHALMOLOGY EXAM  03/27/2015  . DEXA SCAN  06/13/2017  . INFLUENZA VACCINE  04/14/2018  . HEMOGLOBIN A1C  04/15/2018  . PNA vac Low Risk Adult (2 of 2 - PPSV23) 09/21/2018  . FOOT EXAM  12/22/2018  . TETANUS/TDAP   04/10/2024  . Hepatitis C Screening  Completed  . HIV Screening  Completed    The following portions of the patient's history were reviewed and updated as appropriate:  She  has a past medical history of Arthritis, Blood transfusion without reported diagnosis (2018), Diabetes mellitus, GERD (gastroesophageal reflux disease), Gout, H/O: rheumatic fever, Hyperlipidemia, Hypertension, and Stroke ([place]). She does not have any pertinent problems on file. She  has a past surgical history that includes Ventral hernia repair; Partial hysterectomy; Cesarean section; Esophagogastroduodenoscopy (egd) with propofol (N/A, 01/30/2013); Colonoscopy with propofol (N/A, 01/30/2013); Abdominal hysterectomy; Total knee arthroplasty (Right, 04/20/2014); Total knee arthroplasty (Left, 08/23/2015); ERCP (N/A, 12/08/2016); Cholecystectomy (N/A, 12/09/2016); TEE without cardioversion (N/A, 03/16/2017); LOOP RECORDER INSERTION (N/A, 03/18/2017); and LEFT HEART CATH AND CORONARY ANGIOGRAPHY (N/A, 10/18/2017). Her family history includes Crohn's disease in her son; Diabetes in her father and mother; Heart disease in her father; Hyperlipidemia in her father; Hypertension in her mother; Irritable bowel syndrome in her son; Stroke in her father. She  reports that she has never smoked. She has never used smokeless tobacco. She  reports that she does not drink alcohol or use drugs. She has a current medication list which includes the following prescription(s): accu-chek softclix lancets, allopurinol, aspirin, calcium carb-cholecalciferol, calcium carbonate antacid, dexlansoprazole, udderly smooth, fenofibrate, ferrous sulfate, glucose blood, isosorbide mononitrate, methocarbamol, nitroglycerin, potassium chloride sa, rosuvastatin, senna-docusate, sitagliptin-metformin hcl, and valsartan-hydrochlorothiazide. Current Outpatient Medications on File Prior to Visit  Medication Sig Dispense Refill  . ACCU-CHEK SOFTCLIX LANCETS lancets Use as instructed  once a day.  DX Code: E11.9 100 each 1  . aspirin 81 MG EC tablet Take 1 tablet (81 mg total) by mouth daily.    . Calcium Carb-Cholecalciferol (CALCIUM 600-D PO) Take 1 tablet by mouth 2 (two) times daily before a meal.    . Calcium Carbonate Antacid (TUMS PO) Take 2 tablets by mouth 4 (four) times daily as needed (acid reflux).    Marland Kitchen dexlansoprazole (DEXILANT) 60 MG capsule TAKE 1 CAPSULE (60 MG TOTAL) BY MOUTH EVERY MORNING (Patient taking differently: Take 60 mg by mouth daily before breakfast. ) 90 capsule 3  . Emollient (UDDERLY SMOOTH) CREA Apply 1 application topically See admin instructions. Apply topically to skin folds as needed for rash/irritation    . fenofibrate 160 MG tablet Take 1 tablet (160 mg total) by mouth daily. 90 tablet 1  . ferrous sulfate 325 (65 FE) MG EC tablet Take 1 tablet (325 mg total) by mouth 2 (two) times daily. (Patient taking differently: Take 325 mg by mouth 2 (two) times daily before a meal. ) 60 tablet 11  . glucose blood (ACCU-CHEK AVIVA PLUS) test strip Use as instructed once a day.  DX Code E11.9 100 each 1  . isosorbide mononitrate (IMDUR) 30 MG 24 hr tablet Take 1 tablet (30 mg total) by mouth daily. 90 tablet 3  . methocarbamol (ROBAXIN) 500 MG tablet Take 1 tablet (500 mg total) by mouth every 8 (eight) hours as needed for muscle spasms. 60 tablet 0  . nitroGLYCERIN (NITROSTAT) 0.4 MG SL tablet Place 1 tablet (0.4 mg total) under the tongue every 5 (five) minutes x 3 doses as needed for chest pain. 25 tablet 2  . potassium chloride SA (K-DUR,KLOR-CON) 20 MEQ tablet Take 1 tablet (20 mEq total) by mouth daily. Take as instructed per provider 90 tablet 1  . senna-docusate (SENOKOT-S) 8.6-50 MG tablet Take 1 tablet by mouth at bedtime as needed for mild constipation. 30 tablet 1  . valsartan-hydrochlorothiazide (DIOVAN-HCT) 80-12.5 MG tablet Take 1 tablet by mouth daily. 90 tablet 1   No current facility-administered medications on file prior to visit.     She is allergic to other and penicillins..  Review of Systems Review of Systems  Constitutional: Negative for activity change, appetite change and fatigue.  HENT: Negative for hearing loss, congestion, tinnitus and ear discharge.  dentist q72mEyes: Negative for visual disturbance (see optho q1y -- vision corrected to 20/20 with glasses).  Respiratory: Negative for cough, chest tightness and shortness of breath.   Cardiovascular: Negative for chest pain, palpitations and leg swelling.  Gastrointestinal: Negative for abdominal pain, diarrhea, constipation and abdominal distention.  Genitourinary: Negative for urgency, frequency, decreased urine volume and difficulty urinating.  Musculoskeletal: Negative for back pain, arthralgias and gait problem.  Skin: Negative for color change, pallor and rash.  Neurological: Negative for dizziness, light-headedness and headaches.  Hematological: Negative for adenopathy. Does not bruise/bleed easily.  Psychiatric/Behavioral: Negative for suicidal ideas, confusion, sleep disturbance, self-injury, dysphoric mood, decreased concentration and agitation.  Objective:    BP 110/66 (BP Location: Left Arm, Cuff Size: Large)   Pulse 80   Temp 98.3 F (36.8 C) (Oral)   Resp 16   Ht 5' 6"  (1.676 m)   Wt 272 lb 6.4 oz (123.6 kg)   SpO2 96%   BMI 43.97 kg/m  General appearance: alert, cooperative, appears stated age and no distress Head: Normocephalic, without obvious abnormality, atraumatic Eyes: conjunctivae/corneas clear. PERRL, EOM's intact. Fundi benign. Ears: normal TM's and external ear canals both ears Nose: Nares normal. Septum midline. Mucosa normal. No drainage or sinus tenderness. Throat: lips, mucosa, and tongue normal; teeth and gums normal Neck: no adenopathy, no carotid bruit, no JVD, supple, symmetrical, trachea midline and thyroid not enlarged, symmetric, no tenderness/mass/nodules Back: symmetric, no curvature. ROM normal. No  CVA tenderness. Lungs: clear to auscultation bilaterally Breasts: pt refused  Heart: regular rate and rhythm, S1, S2 normal, no murmur, click, rub or gallop Abdomen: soft, non-tender; bowel sounds normal; no masses,  no organomegaly Pelvic: not indicated; post-menopausal, no abnormal Pap smears in past Extremities: weakness low ext and pain in knees due to OA Pulses: 2+ and symmetric Skin: Skin color, texture, turgor normal. No rashes or lesions Lymph nodes: Cervical, supraclavicular, and axillary nodes normal. Neurologic: weakness/ pain in both low ext    Assessment:    Healthy female exam.      Plan:     ghm utd Check labs See After Visit Summary for Counseling Recommendations    1. DM (diabetes mellitus) type II uncontrolled, periph vascular disorder (HCC) Check labs  - Hemoglobin A1c - CBC with Differential/Platelet - Comprehensive metabolic panel - Lipid panel - SitaGLIPtin-MetFORMIN HCl (JANUMET XR) [phone] MG TB24; Take 1 tablet by mouth daily.  Dispense: 90 tablet; Refill: 1 - Microalbumin / creatinine urine ratio  2. Essential hypertension Well controlled, no changes to meds. Encouraged heart healthy diet such as the DASH diet and exercise as tolerated.  - Hemoglobin A1c - CBC with Differential/Platelet - Comprehensive metabolic panel - Lipid panel - Microalbumin / creatinine urine ratio  3. Hyperlipidemia associated with type 2 diabetes mellitus ([place]) Tolerating statin, encouraged heart healthy diet, avoid trans fats, minimize simple carbs and saturated fats. Increase exercise as tolerated - Comprehensive metabolic panel - Lipid panel - rosuvastatin (CRESTOR) 40 MG tablet; Take 1 tablet (40 mg total) by mouth daily.  Dispense: 90 tablet; Refill: 1  4. Controlled type 2 diabetes mellitus with chronic kidney disease, unspecified CKD stage, unspecified whether long term insulin use (HCC) - Hemoglobin A1c - CBC with Differential/Platelet - Comprehensive metabolic  panel - Lipid panel  5. Idiopathic gout, unspecified chronicity, unspecified site  - Uric acid - allopurinol (ZYLOPRIM) 300 MG tablet; Take 1 tablet (300 mg total) by mouth 2 (two) times daily.  Dispense: 180 tablet; Refill: 3  6. Estrogen deficiency   - DG Bone Density; Future  7. Need for shingles vaccine   - Varicella-zoster vaccine IM (Shingrix)

## 2017-12-21 NOTE — Progress Notes (Signed)
Carelink Summary Report / Loop Recorder 

## 2017-12-22 ENCOUNTER — Telehealth: Payer: Self-pay | Admitting: Family Medicine

## 2017-12-22 NOTE — Telephone Encounter (Signed)
Copied from [place]. Topic: Quick Communication - See Telephone Encounter >> Dec 22, 2017  1:13 PM Synthia Innocent wrote: CRM for notification. See Telephone encounter for: 12/22/17. Wants to make sure that provider follows on potassium level, since patient is taking potassium chloride SA (K-DUR,KLOR-CON) 20 MEQ tablet.

## 2017-12-23 NOTE — Telephone Encounter (Signed)
Potassium levels checked 12/21/2017.

## 2017-12-28 LAB — CUP PACEART REMOTE DEVICE CHECK
Date Time Interrogation Session: 20190305191734
MDC IDC PG IMPLANT DT: 20180705

## 2018-01-04 ENCOUNTER — Ambulatory Visit (HOSPITAL_BASED_OUTPATIENT_CLINIC_OR_DEPARTMENT_OTHER)
Admission: RE | Admit: 2018-01-04 | Discharge: 2018-01-04 | Disposition: A | Discharge status: Home / Self Care | Payer: BLUE CROSS/BLUE SHIELD | Source: Ambulatory Visit | Attending: Family Medicine | Admitting: Family Medicine

## 2018-01-04 ENCOUNTER — Other Ambulatory Visit: Payer: Self-pay | Admitting: *Deleted

## 2018-01-04 DIAGNOSIS — Z1231 Encounter for screening mammogram for malignant neoplasm of breast: Secondary | ICD-10-CM

## 2018-01-04 DIAGNOSIS — M810 Age-related osteoporosis without current pathological fracture: Secondary | ICD-10-CM | POA: Insufficient documentation

## 2018-01-04 DIAGNOSIS — Z780 Asymptomatic menopausal state: Secondary | ICD-10-CM | POA: Diagnosis not present

## 2018-01-04 DIAGNOSIS — Z13820 Encounter for screening for osteoporosis: Secondary | ICD-10-CM | POA: Diagnosis not present

## 2018-01-04 DIAGNOSIS — E2839 Other primary ovarian failure: Secondary | ICD-10-CM

## 2018-01-04 DIAGNOSIS — R7989 Other specified abnormal findings of blood chemistry: Secondary | ICD-10-CM

## 2018-01-04 DIAGNOSIS — M8588 Other specified disorders of bone density and structure, other site: Secondary | ICD-10-CM | POA: Diagnosis not present

## 2018-01-06 ENCOUNTER — Other Ambulatory Visit: Payer: Self-pay | Admitting: Family Medicine

## 2018-01-06 DIAGNOSIS — R928 Other abnormal and inconclusive findings on diagnostic imaging of breast: Secondary | ICD-10-CM

## 2018-01-10 ENCOUNTER — Telehealth: Payer: Self-pay | Admitting: *Deleted

## 2018-01-10 NOTE — Telephone Encounter (Signed)
Received Physician Orders from [place]; forwarded to provider/SLS 04/29

## 2018-01-14 ENCOUNTER — Ambulatory Visit
Admission: RE | Admit: 2018-01-14 | Discharge: 2018-01-14 | Disposition: A | Discharge status: Home / Self Care | Payer: Medicare Other | Source: Ambulatory Visit | Attending: Family Medicine | Admitting: Family Medicine

## 2018-01-14 ENCOUNTER — Ambulatory Visit
Admission: RE | Admit: 2018-01-14 | Discharge: 2018-01-14 | Disposition: A | Discharge status: Home / Self Care | Payer: BLUE CROSS/BLUE SHIELD | Source: Ambulatory Visit | Attending: Family Medicine | Admitting: Family Medicine

## 2018-01-14 DIAGNOSIS — R928 Other abnormal and inconclusive findings on diagnostic imaging of breast: Secondary | ICD-10-CM | POA: Diagnosis not present

## 2018-01-14 DIAGNOSIS — N6002 Solitary cyst of left breast: Secondary | ICD-10-CM | POA: Diagnosis not present

## 2018-01-19 ENCOUNTER — Encounter: Payer: Self-pay | Admitting: Internal Medicine

## 2018-01-21 ENCOUNTER — Ambulatory Visit (INDEPENDENT_AMBULATORY_CARE_PROVIDER_SITE_OTHER): Payer: BLUE CROSS/BLUE SHIELD | Admitting: *Deleted

## 2018-01-21 DIAGNOSIS — I639 Cerebral infarction, unspecified: Secondary | ICD-10-CM | POA: Diagnosis not present

## 2018-01-21 LAB — CUP PACEART REMOTE DEVICE CHECK
Implantable Pulse Generator Implant Date: 20180705
MDC IDC SESS DTM: 20190407201000

## 2018-01-23 ENCOUNTER — Other Ambulatory Visit (INDEPENDENT_AMBULATORY_CARE_PROVIDER_SITE_OTHER): Payer: Self-pay | Admitting: Orthopaedic Surgery

## 2018-01-24 ENCOUNTER — Ambulatory Visit: Payer: BLUE CROSS/BLUE SHIELD | Admitting: Cardiology

## 2018-01-24 NOTE — Telephone Encounter (Signed)
Please advise 

## 2018-01-24 NOTE — Progress Notes (Signed)
Carelink Summary Report / Loop Recorder 

## 2018-01-31 ENCOUNTER — Encounter: Payer: Self-pay | Admitting: Internal Medicine

## 2018-01-31 ENCOUNTER — Ambulatory Visit: Payer: BLUE CROSS/BLUE SHIELD | Admitting: Internal Medicine

## 2018-01-31 VITALS — BP 104/60 | HR 96 | Ht 64.5 in | Wt 269.4 lb

## 2018-01-31 DIAGNOSIS — D509 Iron deficiency anemia, unspecified: Secondary | ICD-10-CM

## 2018-01-31 DIAGNOSIS — K219 Gastro-esophageal reflux disease without esophagitis: Secondary | ICD-10-CM

## 2018-01-31 DIAGNOSIS — K317 Polyp of stomach and duodenum: Secondary | ICD-10-CM

## 2018-01-31 DIAGNOSIS — K7460 Unspecified cirrhosis of liver: Secondary | ICD-10-CM | POA: Diagnosis not present

## 2018-01-31 DIAGNOSIS — K7581 Nonalcoholic steatohepatitis (NASH): Secondary | ICD-10-CM | POA: Diagnosis not present

## 2018-01-31 MED ORDER — DEXLANSOPRAZOLE 60 MG PO CPDR: 60.0000 mg | DELAYED_RELEASE_CAPSULE | Freq: Two times a day (BID) | ORAL | 3 refills | Status: DC

## 2018-01-31 NOTE — Patient Instructions (Addendum)
We have sent the following medications to your pharmacy for you to pick up at your convenience: Dexilant 60 mg twice a day   We have given you a  Handout on GERD.

## 2018-01-31 NOTE — Progress Notes (Signed)
HISTORY OF PRESENT ILLNESS:  Rachel Black is a 67 y.o. female with MULTIPLE SIGNIFICANT medical problems including hypertension, hyperlipidemia, morbid obesity, osteoarthritis, diabetes mellitus, prior stroke, probable NASH cirrhosis, coronary artery disease and gout. She has been followed in this office for chronic GERD, adenomatous colon polyps, NASH cirrhosis, and iron deficiency anemia secondary to hyperplastic gastric polyposis with gastric polypectomy. She is also status post ERCP with sphincterotomy for choledocholithiasis. Last seen in this office 07/06/2017 regarding breakthrough GERD symptoms. See that dictation. She had been on Dexilant 60 milligrams daily and ranitidine 300 mg at night. Chronic iron therapy. She is sent today by the cardiology PA regarding ongoing reflux symptoms. Unfortunately, she suffered a heart attack earlier this year. Treated medically. Review of outside blood work from 12/21/2017 reveals normal hemoglobin at 12.5. MCV 94.4. Normal liver tests. Bilirubin 0.4. Platelet count 134,000. Upper endoscopy from last year did not reveal varices. Her last colonoscopy in 2014 revealed a single diminutive polyp and significant left-sided diverticulosis. Follow-up to be considered around this time based on overall health. She has limited mobility. Currently in a wheelchair. Ambulates with a walker. She is accompanied today by her husband Rachel Black. Patient reports significant problems with burping and regurgitation with burning. Particularly at night. She has not been compliant with reflux precautions. No weight loss. Recently told to take Pepcid at night. No dysphagia. Stable dark stool on iron. No lower GI complaints. Abdominal ultrasound to evaluate right upper quadrant pain as well as CT scan both in March 2018 confirm stone disease and hepatic steatosis.  REVIEW OF SYSTEMS:  All non-GI ROS negative except for arthritis, fatigue, shortness of breath, left hand numbness (prior  stroke)  Past Medical History:  Diagnosis Date  . Arthritis    PAIN AND OA BOTH KNEES AND SHOULDERS AND ELBOWS AND WRIST  . Blood transfusion without reported diagnosis 2018   after cholecystectomy  . Diabetes mellitus    ORAL MEDICATION  . GERD (gastroesophageal reflux disease)   . Gout    NO RECENT FLARE UPS  . H/O: rheumatic fever    AS A CHILD - NO KNOWN HEART MURMUR OR HEART PROBLEMS  . Hyperlipidemia   . Hypertension   . Stroke [facility])     Past Surgical History:  Procedure Laterality Date  . ABDOMINAL HYSTERECTOMY    . CESAREAN SECTION    . CHOLECYSTECTOMY N/A 12/09/2016   Procedure: LAPAROSCOPIC CHOLECYSTECTOMY WITH INTRAOPERATIVE CHOLANGIOGRAM;  Surgeon: Stark Klein, MD;  Location: [facility];  Service: General;  Laterality: N/A;  . COLONOSCOPY WITH PROPOFOL N/A 01/30/2013   Procedure: COLONOSCOPY WITH PROPOFOL;  Surgeon: Irene Shipper, MD;  Location: [facility];  Service: Endoscopy;  Laterality: N/A;  . ERCP N/A 12/08/2016   Procedure: ENDOSCOPIC RETROGRADE CHOLANGIOPANCREATOGRAPHY (ERCP);  Surgeon: Ladene Artist, MD;  Location: [facility];  Service: Endoscopy;  Laterality: N/A;  . ESOPHAGOGASTRODUODENOSCOPY (EGD) WITH PROPOFOL N/A 01/30/2013   Procedure: ESOPHAGOGASTRODUODENOSCOPY (EGD) WITH PROPOFOL;  Surgeon: Irene Shipper, MD;  Location: [facility] END[facility];  Service: Endoscopy;  Laterality: N/A;  . LEFT HEART CATH AND CORONARY ANGIOGRAPHY N/A 10/18/2017   Procedure: LEFT HEART CATH AND CORONARY ANGIOGRAPHY;  Surgeon: Martinique, Peter M, MD;  Location: [place] [facility];  Service: Cardiovascular;  Laterality: N/A;  . LOOP RECORDER INSERTION N/A 03/18/2017   Procedure: Loop Recorder Insertion;  Surgeon: Constance Haw, MD;  Location: [place] CV LAB;  Service: Cardiovascular;  Laterality: N/A;  . PARTIAL HYSTERECTOMY    . TEE WITHOUT CARDIOVERSION  N/A 03/16/2017   Procedure: TRANSESOPHAGEAL ECHOCARDIOGRAM (TEE);  Surgeon: Acie Fredrickson Wonda Cheng, MD;  [facility] ENDOSCOPY;  Service:  Cardiovascular;  Laterality: N/A;  . TOTAL KNEE ARTHROPLASTY Right 04/20/2014   Procedure: RIGHT TOTAL KNEE ARTHROPLASTY CONVERTED TO RIGHT KNEE REIMPLANTATION;  Surgeon: Mcarthur Rossetti, MD;  Location: [facility];  Service: Orthopedics;  Laterality: Right;  . TOTAL KNEE ARTHROPLASTY Left 08/23/2015   Procedure: LEFT TOTAL KNEE ARTHROPLASTY;  Surgeon: Mcarthur Rossetti, MD;  Location: [facility];  Service: Orthopedics;  Laterality: Left;  Marland Kitchen VENTRAL HERNIA REPAIR     x2    Social History Rachel Black  reports that she has never smoked. She has never used smokeless tobacco. She reports that she does not drink alcohol or use drugs.  family history includes Breast cancer in her mother; Crohn's disease in her son; Diabetes in her father and mother; Heart disease in her father; Hyperlipidemia in her father; Hypertension in her mother; Irritable bowel syndrome in her son; Stroke in her father.  Allergies  Allergen Reactions  . Other Other (See Comments)    SOME BANDAIDS CAUSE SKIN IRRITATION  . Penicillins Itching    Has patient had a PCN reaction causing immediate rash, facial/tongue/throat swelling, SOB or lightheadedness with hypotension: No Has patient had a PCN reaction causing severe rash involving mucus membranes or skin necrosis: No Has patient had a PCN reaction that required hospitalization No Has patient had a PCN reaction occurring within the last 10 years: No If all of the above answers are "NO", then may proceed with Cephalosporin use.        PHYSICAL EXAMINATION: Vital signs: BP 104/60 (BP Location: Left Wrist, Patient Position: Sitting, Cuff Size: Normal)   Pulse 96   Ht 5' 4.5" (1.638 m)   Wt 269 lb 6 oz (122.2 kg)   BMI 45.52 kg/m   Constitutional: pleasant, obese, chronically ill-appearing, no acute distress Psychiatric: alert and oriented x3, cooperative Eyes: extraocular movements intact, anicteric, conjunctiva pink Mouth: oral pharynx moist, no lesions Neck:  supple no lymphadenopathy Cardiovascular: heart regular rate and rhythm, no murmur Lungs: clear to auscultation bilaterally Abdomen: soft, nontender, nondistended, no obvious ascites, no peritoneal signs, normal bowel sounds, no organomegaly Rectal: omitted Extremities: no clubbing, cyanosis, or lower extremity edema bilaterally Skin: no lesions on visible extremities Neuro: No focal deficits. Cranial nerves intact. No asterixis.   ASSESSMENT:  1. GERD. Significant problems with breakthrough pyrosis and regurgitation particularly at night. Her weight is a major issue 2. NASH cirrhosis. Compensated. Low platelets, mildly. No varices on endoscopy 3. Iron deficiency anemia secondary to gastric polyposis status post gastric polypectomies 4. History of diminutive adenomas. Last exam 5 years ago. Not an appropriate candidate for surveillance based on overall health. She understands and is pleased 5. Morbid obesity. Ongoing problem   PLAN:  1. Reflux precautions. Again reviewed 2. Weight loss. Again stressed 3. Increased Dexilant to 60 mg twice daily. Prescribed 4. Continue chronic iron therapy indefinitely 5. No plans for surveillance colonoscopy given overall health status 6. Resume general medical care with PCP. GI follow-up as needed

## 2018-02-01 NOTE — Progress Notes (Addendum)
[facility]  PATIENT: Rachel Black DOB: 05-17-1951   REASON FOR VISIT: follow up for cortical infarct right lobe  HISTORY FROM: Patient and husband    HISTORY OF PRESENT ILLNESS: Rachel Black a 67 y.o.femalewith history of rheumatic fever, hypertension (inconsistently takes her blood pressure medicine), diabetes mellitus and hyperlipidemia admitted on 03/14/2017 for left arm weakness and slurred speech. CT head acute cortical infarct in the high right right total lobe. MRI confirmed acute infarct along the right lateral motor strength. MRA head and a CT neck negative. EF 55-60%. TEE positive PFO but otherwise normal, LE venous Doppler negative for DVT. Loop recorder placed. LDL 45 and A1c 5.4. She was discharged with aspirin 325 and continue the Crestor and fenofibrate.  Interval History10/16/18 Dr. Erlinda Hong During the interval time, the patient has been doing well. Has HH PT/OT and left hand dexterity difficulty resolved. Currently walk with walker. Sugar controlled well at home, but does not check BP at home, today in clinic 115/70. Loop recorder so far no A. fib. UPDATE 5 /22/2019CM Rachel Black, 67 year old female returns for follow-up with history of admission July 2018 for left arm weakness and slurred speech MRI confirmed acute infarct along the right motor strip.  She is on aspirin for secondary stroke prevention without further stroke or TIA symptoms she has minimal bruising and no bleeding.  She remains on Crestor without myalgias.  Recent LDL 54.  She says her blood sugars are well controlled and she checks them at home.  Blood pressure in the office 103/79.  ER admission 12/08/17 for atypical chest pain.  EKG without acute ischemic changes negative troponin.  Patient continues to complain of knee pain, history of bilateral knee surgery.  She is morbidly obese and says she ambulates with a cane at home.  Her therapies have concluded.  Loop recorder so far no  atrial fibrillation.  She returns for reevaluation   REVIEW OF SYSTEMS: Full 14 system review of systems performed and notable only for those listed, all others are neg:  Constitutional: neg  Cardiovascular: neg Ear/Nose/Throat: neg  Skin: neg Eyes: neg Respiratory: neg Gastroitestinal: neg  Hematology/Lymphatic: neg  Endocrine: neg Musculoskeletal: Knee pain Allergy/Immunology: neg Neurological: neg Psychiatric: neg Sleep : neg   ALLERGIES: Allergies  Allergen Reactions  . Other Other (See Comments)    SOME BANDAIDS CAUSE SKIN IRRITATION  . Penicillins Itching    Has patient had a PCN reaction causing immediate rash, facial/tongue/throat swelling, SOB or lightheadedness with hypotension: No Has patient had a PCN reaction causing severe rash involving mucus membranes or skin necrosis: No Has patient had a PCN reaction that required hospitalization No Has patient had a PCN reaction occurring within the last 10 years: No If all of the above answers are "NO", then may proceed with Cephalosporin use.     HOME MEDICATIONS: Outpatient Medications Prior to Visit  Medication Sig Dispense Refill  . ACCU-CHEK SOFTCLIX LANCETS lancets Use as instructed once a day.  DX Code: E11.9 100 each 1  . allopurinol (ZYLOPRIM) 300 MG tablet Take 1 tablet (300 mg total) by mouth 2 (two) times daily. 180 tablet 3  . aspirin 81 MG EC tablet Take 1 tablet (81 mg total) by mouth daily.    . Calcium Carb-Cholecalciferol (CALCIUM 600-D PO) Take 1 tablet by mouth 2 (two) times daily before a meal.    . Calcium Carbonate Antacid (TUMS PO) Take 2 tablets by mouth 4 (four) times daily as needed (acid  reflux).    Marland Kitchen dexlansoprazole (DEXILANT) 60 MG capsule Take 1 capsule (60 mg total) by mouth 2 (two) times daily. TAKE 1 CAPSULE (60 MG TOTAL) BY MOUTH TWICE A DAY 180 capsule 3  . Emollient (UDDERLY SMOOTH) CREA Apply 1 application topically See admin instructions. Apply topically to skin folds as needed for  rash/irritation    . fenofibrate 160 MG tablet Take 1 tablet (160 mg total) by mouth daily. 90 tablet 1  . ferrous sulfate 325 (65 FE) MG EC tablet Take 1 tablet (325 mg total) by mouth 2 (two) times daily. (Patient taking differently: Take 325 mg by mouth 2 (two) times daily before a meal. ) 60 tablet 11  . glucose blood (ACCU-CHEK AVIVA PLUS) test strip Use as instructed once a day.  DX Code E11.9 100 each 1  . isosorbide mononitrate (IMDUR) 30 MG 24 hr tablet Take 1 tablet (30 mg total) by mouth daily. 90 tablet 3  . methocarbamol (ROBAXIN) 500 MG tablet TAKE 1 TABLET (500 MG TOTAL) BY MOUTH EVERY 8 (EIGHT) HOURS AS NEEDED FOR MUSCLE SPASMS. 60 tablet 0  . nitroGLYCERIN (NITROSTAT) 0.4 MG SL tablet Place 1 tablet (0.4 mg total) under the tongue every 5 (five) minutes x 3 doses as needed for chest pain. 25 tablet 2  . potassium chloride SA (K-DUR,KLOR-CON) 20 MEQ tablet Take 1 tablet (20 mEq total) by mouth daily. Take as instructed per provider 90 tablet 1  . rosuvastatin (CRESTOR) 40 MG tablet Take 1 tablet (40 mg total) by mouth daily. 90 tablet 1  . senna-docusate (SENOKOT-S) 8.6-50 MG tablet Take 1 tablet by mouth at bedtime as needed for mild constipation. 30 tablet 1  . SitaGLIPtin-MetFORMIN HCl (JANUMET XR) [phone] MG TB24 Take 1 tablet by mouth daily. 90 tablet 1  . valsartan-hydrochlorothiazide (DIOVAN-HCT) 80-12.5 MG tablet Take 1 tablet by mouth daily. 90 tablet 1   No facility-administered medications prior to visit.     PAST MEDICAL HISTORY: Past Medical History:  Diagnosis Date  . Arthritis    PAIN AND OA BOTH KNEES AND SHOULDERS AND ELBOWS AND WRIST  . Blood transfusion without reported diagnosis 2018   after cholecystectomy  . Diabetes mellitus    ORAL MEDICATION  . GERD (gastroesophageal reflux disease)   . Gout    NO RECENT FLARE UPS  . H/O: rheumatic fever    AS A CHILD - NO KNOWN HEART MURMUR OR HEART PROBLEMS  . Heart attack ([place])   . Hyperlipidemia   .  Hypertension   . Stroke [facility])     PAST SURGICAL HISTORY: Past Surgical History:  Procedure Laterality Date  . ABDOMINAL HYSTERECTOMY    . CESAREAN SECTION    . CHOLECYSTECTOMY N/A 12/09/2016   Procedure: LAPAROSCOPIC CHOLECYSTECTOMY WITH INTRAOPERATIVE CHOLANGIOGRAM;  Surgeon: Stark Klein, MD;  [facility]: [facility];  Service: General;  Laterality: N/A;  . COLONOSCOPY WITH PROPOFOL N/A 01/30/2013   Procedure: COLONOSCOPY WITH PROPOFOL;  Surgeon: Irene Shipper, MD;  [facility];  Service: Endoscopy;  Laterality: N/A;  . ERCP N/A 12/08/2016   Procedure: ENDOSCOPIC RETROGRADE CHOLANGIOPANCREATOGRAPHY (ERCP);  Surgeon: Ladene Artist, MD;  Location: [facility];  Service: Endoscopy;  Laterality: N/A;  . ESOPHAGOGASTRODUODENOSCOPY (EGD) WITH PROPOFOL N/A 01/30/2013   Procedure: ESOPHAGOGASTRODUODENOSCOPY (EGD) WITH PROPOFOL;  Surgeon: Irene Shipper, MD;  [facility];  Service: Endoscopy;  Laterality: N/A;  . LEFT HEART CATH AND CORONARY ANGIOGRAPHY N/A 10/18/2017   Procedure: LEFT HEART CATH AND CORONARY ANGIOGRAPHY;  Surgeon: Martinique, Peter M, MD;  Location: [place] [facility];  Service: Cardiovascular;  Laterality: N/A;  . LOOP RECORDER INSERTION N/A 03/18/2017   Procedure: Loop Recorder Insertion;  Surgeon: Constance Haw, MD;  Location: [place] [facility];  Service: Cardiovascular;  Laterality: N/A;  . PARTIAL HYSTERECTOMY    . TEE WITHOUT CARDIOVERSION N/A 03/16/2017   Procedure: TRANSESOPHAGEAL ECHOCARDIOGRAM (TEE);  Surgeon: Acie Fredrickson Wonda Cheng, MD;  Location[facility];  Service: Cardiovascular;  Laterality: N/A;  . TOTAL KNEE ARTHROPLASTY Right 04/20/2014   Procedure: RIGHT TOTAL KNEE ARTHROPLASTY CONVERTED TO RIGHT KNEE REIMPLANTATION;  Surgeon: Mcarthur Rossetti, MD;  [facility];  Service: Orthopedics;  Laterality: Right;  . TOTAL KNEE ARTHROPLASTY Left 08/23/2015   Procedure: LEFT TOTAL KNEE ARTHROPLASTY;  Surgeon: Mcarthur Rossetti, MD;  [facility];   Service: Orthopedics;  Laterality: Left;  Marland Kitchen VENTRAL HERNIA REPAIR     x2    FAMILY HISTORY: Family History  Problem Relation Age of Onset  . Diabetes Mother   . Hypertension Mother        entire family  . Breast cancer Mother        diagnosed in her 63's  . Stroke Father        CVA  . Hyperlipidemia Father        entire family  . Diabetes Father   . Heart disease Father        No details.  Not at an early age.    . Crohn's disease Son   . Irritable bowel syndrome Son     SOCIAL HISTORY: Social History   Socioeconomic History  . Marital status: Married    Spouse name: Not on file  . Number of children: 2  . Years of education: Not on file  . Highest education level: Not on file  Occupational History  . Occupation: housewife    Employer: UNEMPLOYED  Social Needs  . Financial resource strain: Not on file  . Food insecurity:    Worry: Not on file    Inability: Not on file  . Transportation needs:    Medical: Not on file    Non-medical: Not on file  Tobacco Use  . Smoking status: Never Smoker  . Smokeless tobacco: Never Used  Substance and Sexual Activity  . Alcohol use: No  . Drug use: No  . Sexual activity: Yes    Partners: Male  Lifestyle  . Physical activity:    Days per week: Not on file    Minutes per session: Not on file  . Stress: Not on file  Relationships  . Social connections:    Talks on phone: Not on file    Gets together: Not on file    Attends religious service: Not on file    Active member of club or organization: Not on file    Attends meetings of clubs or organizations: Not on file    Relationship status: Not on file  . Intimate partner violence:    Fear of current or ex partner: Not on file    Emotionally abused: Not on file    Physically abused: Not on file    Forced sexual activity: Not on file  Other Topics Concern  . Not on file  Social History Narrative   No exercise secondary to knee pain     PHYSICAL EXAM  Vitals:    02/02/18 1232  BP: 103/79  Pulse: 96  Weight: 270 lb 6.4 oz (122.7 kg)  Height: 5\' 6"  (1.676 m)   Body mass index is 43.64 kg/m.  Generalized: Well developed, morbidly obese female in no acute distress  Head: normocephalic and atraumatic,. Oropharynx benign  Neck: Supple, no carotid bruits  Cardiac: Regular rate rhythm, no murmur  Musculoskeletal: No deformity   Neurological examination   Mentation: Alert oriented to time, place, history taking. Attention span and concentration appropriate. Recent and remote memory intact.  Follows all commands speech and language fluent.   Cranial nerve II-XII: Pupils were equal round reactive to light extraocular movements were full, visual field were full on confrontational test. Facial sensation and strength were normal. hearing was intact to finger rubbing bilaterally. Uvula tongue midline. head turning and shoulder shrug were normal and symmetric.Tongue protrusion into cheek strength was normal. Motor: normal bulk and tone, full strength in the BUE, BLE, except chronic left foot drop  Sensory: normal and symmetric to light touch, pinprick, and  Vibration, in the upper and lower extremity Coordination: finger-nose-finger, heel-to-shin bilaterally, no dysmetria Reflexes: 1+ upper lower and symmetric plantar responses were flexor bilaterally. Gait and Station: Rising up from seated position slow steady gait no assistive device.  DIAGNOSTIC DATA (LABS, IMAGING, TESTING) - I reviewed patient records, labs, notes, testing and imaging myself where available.  Lab Results  Component Value Date   WBC 6.8 12/21/2017   HGB 12.5 12/21/2017   HCT 38.0 12/21/2017   MCV 94.4 12/21/2017   PLT 134.0 (L) 12/21/2017      Component Value Date/Time   NA 141 12/21/2017 1044   K 5.1 12/21/2017 1044   CL 105 12/21/2017 1044   CO2 28 12/21/2017 1044   GLUCOSE 146 (H) 12/21/2017 1044   BUN 22 12/21/2017 1044   CREATININE 1.42 (H) 12/21/2017 1044   CALCIUM  10.2 12/21/2017 1044   PROT 6.7 12/21/2017 1044   ALBUMIN 4.3 12/21/2017 1044   AST 24 12/21/2017 1044   ALT 21 12/21/2017 1044   ALKPHOS 34 (L) 12/21/2017 1044   BILITOT 0.4 12/21/2017 1044   GFRNONAA 38 (L) 12/08/2017 1304   GFRAA 44 (L) 12/08/2017 1304   Lab Results  Component Value Date   CHOL 127 12/21/2017   HDL 32.50 (L) 12/21/2017   LDLCALC 54 10/17/2017   LDLDIRECT 50.0 12/21/2017   TRIG 299.0 (H) 12/21/2017   CHOLHDL 4 12/21/2017   Lab Results  Component Value Date   HGBA1C 6.0 12/21/2017   No results found for: VITAMINB12 Lab Results  Component Value Date   TSH 2.863 10/16/2017      ASSESSMENT AND PLAN  67 y.o.  female with PMH of rheumatic fever, hypertension (inconsistently takes her blood pressure medicine), diabetes mellitus and hyperlipidemia admitted on 03/14/2017 for acute infarct along the right lateral motor strength. MRA head and a CT neck negative. EF 55-60%. TEE positive PFO but otherwise normal, LE venous Doppler negative for DVT. Loop recorder placed. LDL 45 and A1c 5.4. She was discharged with aspirin and continue the Crestor and fenofibrate.  Therapies have concluded.Still with mild  dexterity Left hand.  Currently walk with cane  Loop recorder so far no A. Fib.The patient is a current patient of Dr. Erlinda Hong  who is out of the office today . This note is sent to the work in doctor.    Plan:   continue ASA and crestor for stroke prevention   continue loop recorder monitoring.No far no atrial fib  check BP and glucose at home and record.  Follow up with your primary  care physician for stroke risk factor modification. Recommend maintain blood pressure goal <130/80, diabetes with hemoglobin A1c goal below 7.0% and lipids with LDL cholesterol goal below 70 mg/dL.   healthy diet and regular exercise follow up in 84months. follow-up with cardiology   Dennie Bible, [facility], [facility], [facility] [address] [place],  [place] [address] [phone]  I reviewed the above note and documentation by the Nurse Practitioner and agree with the history, physical exam, assessment and plan as outlined above. I was immediately available for face-to-face consultation. Star Age, MD, PhD [facility])

## 2018-02-02 ENCOUNTER — Encounter: Payer: Self-pay | Admitting: Nurse Practitioner

## 2018-02-02 ENCOUNTER — Ambulatory Visit: Payer: BLUE CROSS/BLUE SHIELD | Admitting: Nurse Practitioner

## 2018-02-02 VITALS — BP 103/79 | HR 96 | Ht 66.0 in | Wt 270.4 lb

## 2018-02-02 DIAGNOSIS — E782 Mixed hyperlipidemia: Secondary | ICD-10-CM

## 2018-02-02 DIAGNOSIS — I63411 Cerebral infarction due to embolism of right middle cerebral artery: Secondary | ICD-10-CM | POA: Diagnosis not present

## 2018-02-02 DIAGNOSIS — I10 Essential (primary) hypertension: Secondary | ICD-10-CM | POA: Diagnosis not present

## 2018-02-02 NOTE — Patient Instructions (Addendum)
continue ASA and crestor for stroke prevention   continue loop recorder monitoring.No far no atrial fib  check BP and glucose at home and record.  Follow up with your primary care physician for stroke risk factor modification. Recommend maintain blood pressure goal <130/80, diabetes with hemoglobin A1c goal below 7.0% and lipids with LDL cholesterol goal below 70 mg/dL.   healthy diet and regular exercise follow up in 20months. follow-up with cardiology

## 2018-02-15 LAB — CUP PACEART REMOTE DEVICE CHECK
Date Time Interrogation Session: 20190510203733
MDC IDC PG IMPLANT DT: 20180705

## 2018-02-17 NOTE — Progress Notes (Signed)
Cardiology Office Note   Date:  02/18/2018   ID:  Rachel Black, DOB 01-18-1951, MRN [id number]  PCP:  Ann Held, DO  Cardiologist:   No primary care provider on file.   Chief Complaint  Patient presents with  . Coronary Artery Disease      History of Present Illness: Rachel Black is a 67 y.o. female who presents for follow-up of coronary artery disease.  She was admitted in February.  She had a non-STEMI.  She had an occluded second marginal.  She was managed medically.  She has had a cryptogenic stroke.  She has an implanted loop recorder.  She did have a PFO on TEE.  I did review the last loop recorder print out.  Since she was seen she has done relatively well.  The patient denies any new symptoms such as chest discomfort, neck or arm discomfort. There has been no new shortness of breath, PND or orthopnea. There have been no reported palpitations, presyncope or syncope.    She is limited in ambulation by joint pain.     Past Medical History:  Diagnosis Date  . Arthritis    PAIN AND OA BOTH KNEES AND SHOULDERS AND ELBOWS AND WRIST  . Blood transfusion without reported diagnosis 2018   after cholecystectomy  . Diabetes mellitus    ORAL MEDICATION  . GERD (gastroesophageal reflux disease)   . Gout    NO RECENT FLARE UPS  . H/O: rheumatic fever    AS A CHILD - NO KNOWN HEART MURMUR OR HEART PROBLEMS  . Heart attack ([place])   . Hyperlipidemia   . Hypertension   . Stroke [facility]     Past Surgical History:  Procedure Laterality Date  . ABDOMINAL HYSTERECTOMY    . CESAREAN SECTION    . CHOLECYSTECTOMY N/A 12/09/2016   Procedure: LAPAROSCOPIC CHOLECYSTECTOMY WITH INTRAOPERATIVE CHOLANGIOGRAM;  Surgeon: Stark Klein, MD;  [facility];  Service: General;  Laterality: N/A;  . COLONOSCOPY WITH PROPOFOL N/A 01/30/2013   Procedure: COLONOSCOPY WITH PROPOFOL;  Surgeon: Irene Shipper, MD;  [facility];  Service: Endoscopy;  Laterality: N/A;  . ERCP N/A  12/08/2016   Procedure: ENDOSCOPIC RETROGRADE CHOLANGIOPANCREATOGRAPHY (ERCP);  Surgeon: Ladene Artist, MD;  Location[facility];  Service: Endoscopy;  Laterality: N/A;  . ESOPHAGOGASTRODUODENOSCOPY (EGD) WITH PROPOFOL N/A 01/30/2013   Procedure: ESOPHAGOGASTRODUODENOSCOPY (EGD) WITH PROPOFOL;  Surgeon: Irene Shipper, MD;  [facility];  Service: Endoscopy;  Laterality: N/A;  . LEFT HEART CATH AND CORONARY ANGIOGRAPHY N/A 10/18/2017   Procedure: LEFT HEART CATH AND CORONARY ANGIOGRAPHY;  Surgeon: Martinique, Peter M, MD;  Location: [place] [facility];  Service: Cardiovascular;  Laterality: N/A;  . LOOP RECORDER INSERTION N/A 03/18/2017   Procedure: Loop Recorder Insertion;  Surgeon: Constance Haw, MD;  Location: [place] [facility];  Service: Cardiovascular;  Laterality: N/A;  . PARTIAL HYSTERECTOMY    . TEE WITHOUT CARDIOVERSION N/A 03/16/2017   Procedure: TRANSESOPHAGEAL ECHOCARDIOGRAM (TEE);  Surgeon: Acie Fredrickson Wonda Cheng, MD;  Location: [facility];  Service: Cardiovascular;  Laterality: N/A;  . TOTAL KNEE ARTHROPLASTY Right 04/20/2014   Procedure: RIGHT TOTAL KNEE ARTHROPLASTY CONVERTED TO RIGHT KNEE REIMPLANTATION;  Surgeon: Mcarthur Rossetti, MD;  [facility];  Service: Orthopedics;  Laterality: Right;  . TOTAL KNEE ARTHROPLASTY Left 08/23/2015   Procedure: LEFT TOTAL KNEE ARTHROPLASTY;  Surgeon: Mcarthur Rossetti, MD;  [facility];  Service: Orthopedics;  Laterality: Left;  .  VENTRAL HERNIA REPAIR     x2     Current Outpatient Medications  Medication Sig Dispense Refill  . ACCU-CHEK SOFTCLIX LANCETS lancets Use as instructed once a day.  DX Code: E11.9 100 each 1  . allopurinol (ZYLOPRIM) 300 MG tablet Take 1 tablet (300 mg total) by mouth 2 (two) times daily. 180 tablet 3  . aspirin 81 MG EC tablet Take 1 tablet (81 mg total) by mouth daily.    . Calcium Carb-Cholecalciferol (CALCIUM 600-D PO) Take 1 tablet by mouth 2 (two) times daily before a meal.    .  Calcium Carbonate Antacid (TUMS PO) Take 2 tablets by mouth 4 (four) times daily as needed (acid reflux).    Marland Kitchen dexlansoprazole (DEXILANT) 60 MG capsule Take 1 capsule (60 mg total) by mouth 2 (two) times daily. TAKE 1 CAPSULE (60 MG TOTAL) BY MOUTH TWICE A DAY 180 capsule 3  . Emollient (UDDERLY SMOOTH) CREA Apply 1 application topically See admin instructions. Apply topically to skin folds as needed for rash/irritation    . fenofibrate 160 MG tablet Take 1 tablet (160 mg total) by mouth daily. 90 tablet 1  . ferrous sulfate 325 (65 FE) MG EC tablet Take 1 tablet (325 mg total) by mouth 2 (two) times daily. (Patient taking differently: Take 325 mg by mouth 2 (two) times daily before a meal. ) 60 tablet 11  . glucose blood (ACCU-CHEK AVIVA PLUS) test strip Use as instructed once a day.  DX Code E11.9 100 each 1  . isosorbide mononitrate (IMDUR) 30 MG 24 hr tablet Take 1 tablet (30 mg total) by mouth daily. 90 tablet 3  . methocarbamol (ROBAXIN) 500 MG tablet TAKE 1 TABLET (500 MG TOTAL) BY MOUTH EVERY 8 (EIGHT) HOURS AS NEEDED FOR MUSCLE SPASMS. 60 tablet 0  . nitroGLYCERIN (NITROSTAT) 0.4 MG SL tablet Place 1 tablet (0.4 mg total) under the tongue every 5 (five) minutes x 3 doses as needed for chest pain. 25 tablet 2  . potassium chloride SA (K-DUR,KLOR-CON) 20 MEQ tablet Take 1 tablet (20 mEq total) by mouth daily. Take as instructed per provider 90 tablet 1  . rosuvastatin (CRESTOR) 40 MG tablet Take 1 tablet (40 mg total) by mouth daily. 90 tablet 1  . senna-docusate (SENOKOT-S) 8.6-50 MG tablet Take 1 tablet by mouth at bedtime as needed for mild constipation. 30 tablet 1  . SitaGLIPtin-MetFORMIN HCl (JANUMET XR) [phone] MG TB24 Take 1 tablet by mouth daily. 90 tablet 1  . valsartan-hydrochlorothiazide (DIOVAN-HCT) 80-12.5 MG tablet Take 1 tablet by mouth daily. 90 tablet 1   No current facility-administered medications for this visit.     Allergies:   Other and Penicillins      ROS:   Please see the history of present illness.   Otherwise, review of systems are positive for none.   All other systems are reviewed and negative.    PHYSICAL EXAM: VS:  BP 137/78   Pulse 67   Ht 5' 6"  (1.676 m)   Wt 268 lb 12.8 oz (121.9 kg)   BMI 43.39 kg/m  , BMI Body mass index is 43.39 kg/m. GENERAL:  Well appearing NECK:  No jugular venous distention, waveform within normal limits, carotid upstroke brisk and symmetric, no bruits, no thyromegaly LUNGS:  Clear to auscultation bilaterally BACK:  No CVA tenderness CHEST:  Unremarkable HEART:  PMI not displaced or sustained,S1 and S2 within normal limits, no S3, no S4, no clicks, no rubs, no murmurs ABD:  Flat, positive bowel sounds normal in frequency in pitch, no bruits, no rebound, no guarding, no midline pulsatile mass, no hepatomegaly, no splenomegaly EXT:  2 plus pulses throughout, no edema, no cyanosis no clubbing    EKG:  EKG is ordered today. The ekg ordered today demonstrates sinus rhythm, rate 67, axis within normal limits, intervals within normal limits, no acute ST-T wave changes.   Recent Labs: 10/16/2017: TSH 2.863 10/17/2017: Magnesium 2.0 12/21/2017: ALT 21; BUN 22; Creatinine, Ser 1.42; Hemoglobin 12.5; Platelets 134.0; Potassium 5.1; Sodium 141    Lipid Panel    Component Value Date/Time   CHOL 127 12/21/2017 1044   TRIG 299.0 (H) 12/21/2017 1044   HDL 32.50 (L) 12/21/2017 1044   CHOLHDL 4 12/21/2017 1044   VLDL 59.8 (H) 12/21/2017 1044   LDLCALC 54 10/17/2017 0256   LDLDIRECT 50.0 12/21/2017 1044      Wt Readings from Last 3 Encounters:  02/18/18 268 lb 12.8 oz (121.9 kg)  02/02/18 270 lb 6.4 oz (122.7 kg)  01/31/18 269 lb 6 oz (122.2 kg)      Other studies Reviewed: Additional studies/ records that were reviewed today include: None. Review of the above records demonstrates:  Please see elsewhere in the note.     ASSESSMENT AND PLAN:  CAD:  The patient has no new sypmtoms.  No further  cardiovascular testing is indicated.  We will continue with aggressive risk reduction and meds as listed.  HTN:  The blood pressure is at target. No change in medications is indicated. We will continue with therapeutic lifestyle changes (TLC).  STROKE:  No clear etiology.  I did review the implanted loop.  She has had no atrial fib.     Current medicines are reviewed at length with the patient today.  The patient does not have concerns regarding medicines.  The following changes have been made:  no change  Labs/ tests ordered today include: None  Orders Placed This Encounter  Procedures  . EKG 12-Lead     Disposition:   FU with me in 12 months.      Signed, Minus Breeding, MD  02/18/2018 4:56 PM    [facility]

## 2018-02-18 ENCOUNTER — Encounter: Payer: Self-pay | Admitting: Cardiology

## 2018-02-18 ENCOUNTER — Ambulatory Visit: Payer: BLUE CROSS/BLUE SHIELD | Admitting: Cardiology

## 2018-02-18 VITALS — BP 137/78 | HR 67 | Ht 66.0 in | Wt 268.8 lb

## 2018-02-18 DIAGNOSIS — I25119 Atherosclerotic heart disease of native coronary artery with unspecified angina pectoris: Secondary | ICD-10-CM

## 2018-02-18 DIAGNOSIS — I10 Essential (primary) hypertension: Secondary | ICD-10-CM

## 2018-02-18 DIAGNOSIS — I2510 Atherosclerotic heart disease of native coronary artery without angina pectoris: Secondary | ICD-10-CM | POA: Insufficient documentation

## 2018-02-18 NOTE — Patient Instructions (Signed)
Medication Instructions:  Continue current medications  If you need a refill on your cardiac medications before your next appointment, please call your pharmacy.  Labwork: None Ordered   Testing/Procedures: None Ordered  Follow-Up: Your physician wants you to follow-up in: 6 Months. You should receive a reminder letter in the mail two months in advance. If you do not receive a letter, please call our office [phone].      Thank you for choosing [facility]!!

## 2018-02-23 ENCOUNTER — Ambulatory Visit (INDEPENDENT_AMBULATORY_CARE_PROVIDER_SITE_OTHER): Payer: BLUE CROSS/BLUE SHIELD | Admitting: *Deleted

## 2018-02-23 DIAGNOSIS — I639 Cerebral infarction, unspecified: Secondary | ICD-10-CM

## 2018-02-24 NOTE — Progress Notes (Signed)
Carelink Summary Report / Loop Recorder 

## 2018-03-28 ENCOUNTER — Ambulatory Visit (INDEPENDENT_AMBULATORY_CARE_PROVIDER_SITE_OTHER): Payer: BLUE CROSS/BLUE SHIELD | Admitting: *Deleted

## 2018-03-28 DIAGNOSIS — I639 Cerebral infarction, unspecified: Secondary | ICD-10-CM

## 2018-03-29 NOTE — Progress Notes (Signed)
Carelink Summary Report / Loop Recorder 

## 2018-04-01 LAB — CUP PACEART REMOTE DEVICE CHECK
Date Time Interrogation Session: 20190612203601
Implantable Pulse Generator Implant Date: 20180705

## 2018-05-02 ENCOUNTER — Ambulatory Visit (INDEPENDENT_AMBULATORY_CARE_PROVIDER_SITE_OTHER): Payer: BLUE CROSS/BLUE SHIELD | Admitting: *Deleted

## 2018-05-02 DIAGNOSIS — I639 Cerebral infarction, unspecified: Secondary | ICD-10-CM

## 2018-05-03 NOTE — Progress Notes (Signed)
Carelink Summary Report / Loop Recorder 

## 2018-05-08 ENCOUNTER — Other Ambulatory Visit: Payer: Self-pay | Admitting: Family Medicine

## 2018-05-08 DIAGNOSIS — I10 Essential (primary) hypertension: Secondary | ICD-10-CM

## 2018-05-16 LAB — CUP PACEART REMOTE DEVICE CHECK
Implantable Pulse Generator Implant Date: 20180705
MDC IDC SESS DTM: 20190715203634

## 2018-05-18 DIAGNOSIS — E1129 Type 2 diabetes mellitus with other diabetic kidney complication: Secondary | ICD-10-CM | POA: Diagnosis not present

## 2018-05-18 DIAGNOSIS — N183 Chronic kidney disease, stage 3 (moderate): Secondary | ICD-10-CM | POA: Diagnosis not present

## 2018-05-18 DIAGNOSIS — I10 Essential (primary) hypertension: Secondary | ICD-10-CM | POA: Diagnosis not present

## 2018-05-18 DIAGNOSIS — M109 Gout, unspecified: Secondary | ICD-10-CM | POA: Diagnosis not present

## 2018-06-02 ENCOUNTER — Ambulatory Visit (INDEPENDENT_AMBULATORY_CARE_PROVIDER_SITE_OTHER): Payer: BLUE CROSS/BLUE SHIELD | Admitting: *Deleted

## 2018-06-02 DIAGNOSIS — I639 Cerebral infarction, unspecified: Secondary | ICD-10-CM | POA: Diagnosis not present

## 2018-06-03 NOTE — Progress Notes (Signed)
Carelink Summary Report / Loop Recorder 

## 2018-06-06 LAB — CUP PACEART REMOTE DEVICE CHECK
Date Time Interrogation Session: 20190817214003
MDC IDC PG IMPLANT DT: 20180705

## 2018-06-13 LAB — CUP PACEART REMOTE DEVICE CHECK
Date Time Interrogation Session: 20190919221016
Implantable Pulse Generator Implant Date: 20180705

## 2018-06-22 IMAGING — RF DG CHOLANGIOGRAM OPERATIVE
1 series · 8 of 8 positions shown · non-contrast
Comparison: None.

CLINICAL DATA: Right upper quadrant pain

EXAM:
INTRAOPERATIVE CHOLANGIOGRAM
TECHNIQUE: Cholangiographic images from the C-arm fluoroscopic device were
submitted for interpretation post-operatively. Please see the
procedural report for the amount of contrast and the fluoroscopy
time utilized.

[Series 1: run · 2 acquisitions, 8 frames shown]
[im 1/2]
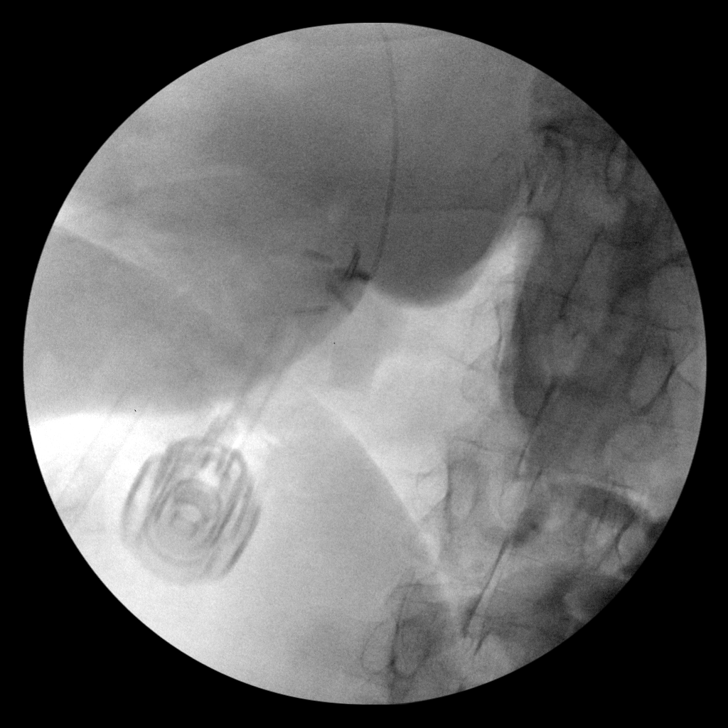
[im 1/2]
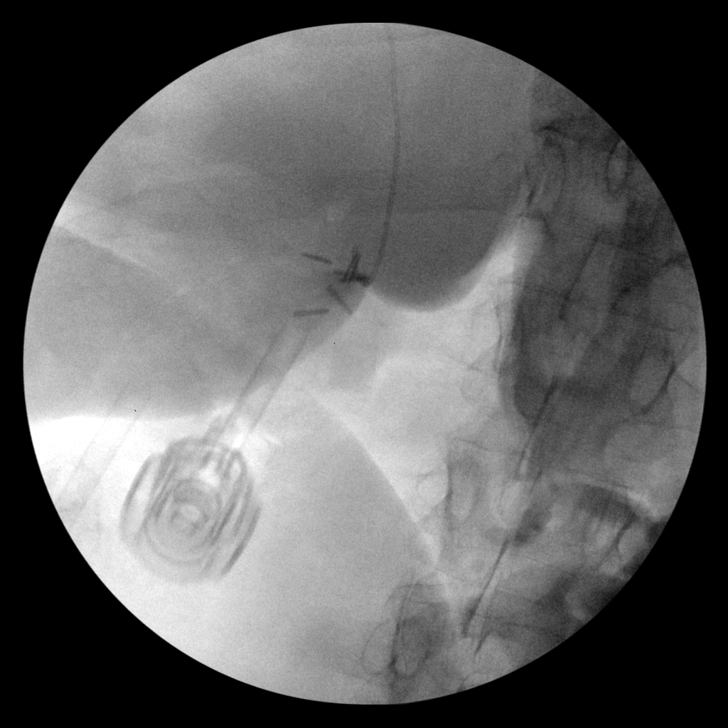
[im 1/2]
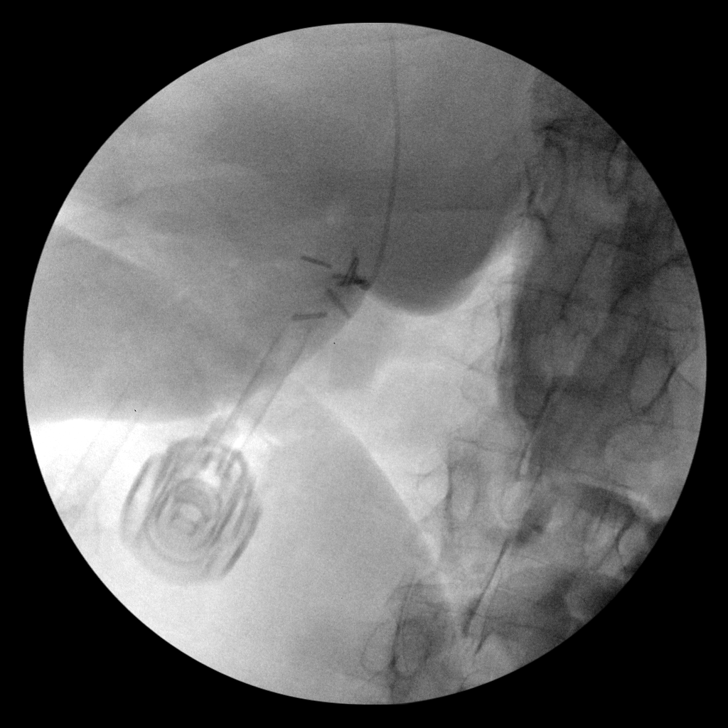
[im 1/2]
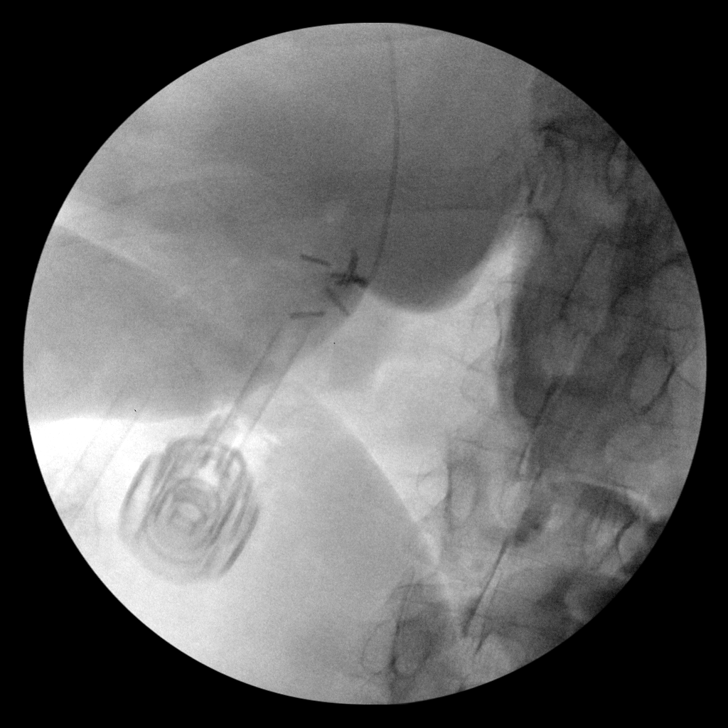
[im 2/2]
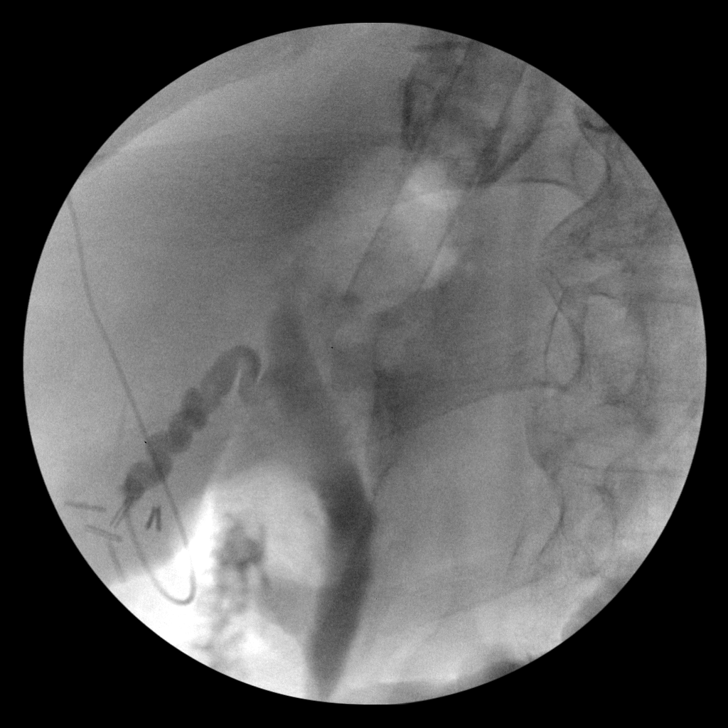
[im 2/2]
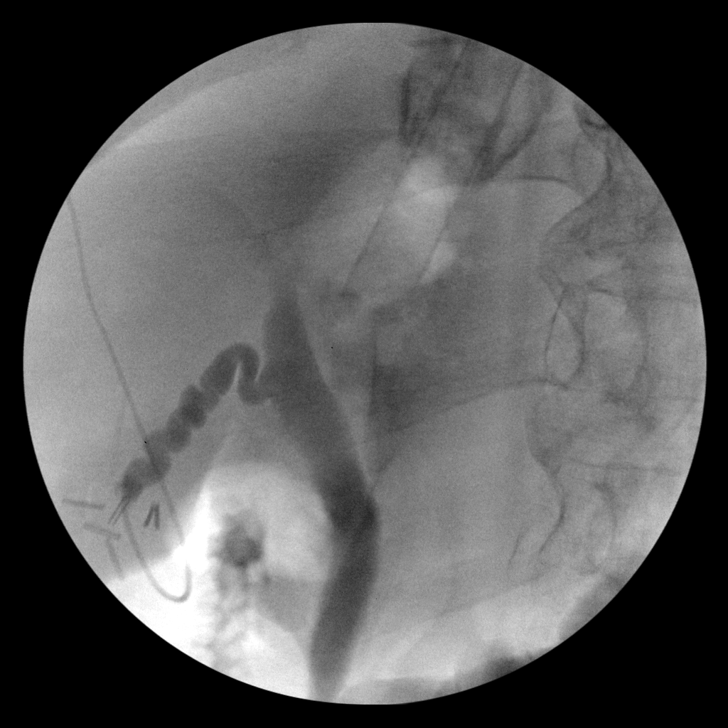
[im 2/2]
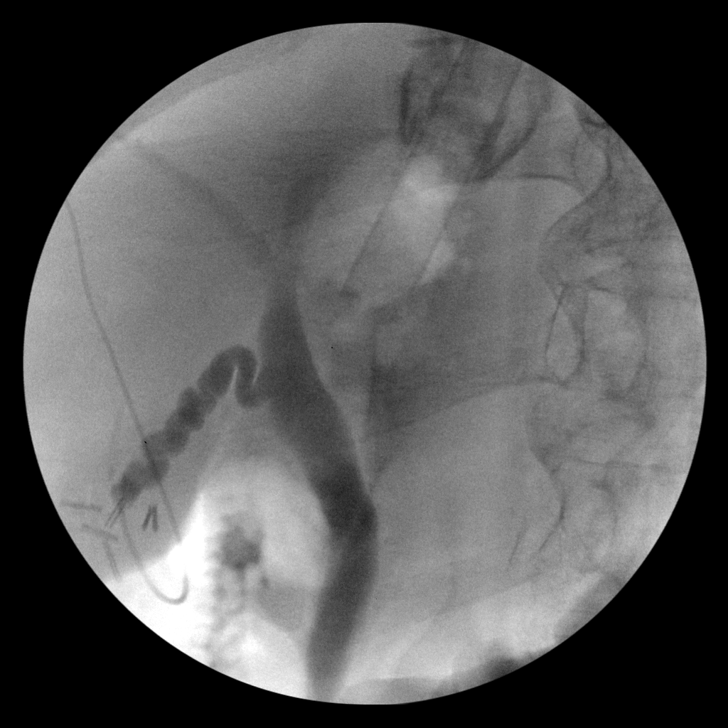
[im 2/2]
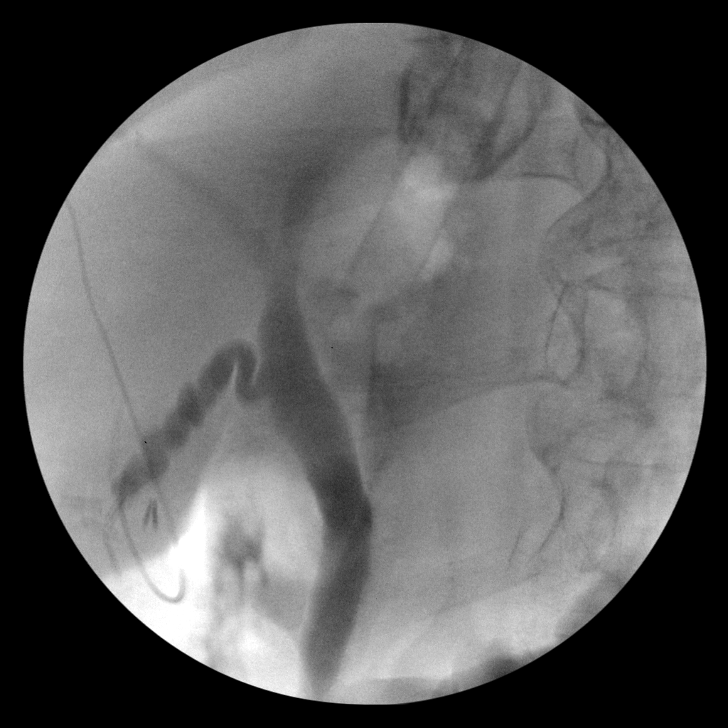

[8 of 8 positions shown; findings below may reference images not displayed]

FINDINGS: Contrast fills the biliary tree and duodenum without filling defects
in the common bile duct. The common bile duct is dilated.
IMPRESSION: Patent biliary tree.

## 2018-06-28 ENCOUNTER — Other Ambulatory Visit: Payer: Self-pay | Admitting: Internal Medicine

## 2018-07-05 ENCOUNTER — Ambulatory Visit (INDEPENDENT_AMBULATORY_CARE_PROVIDER_SITE_OTHER): Payer: BLUE CROSS/BLUE SHIELD | Admitting: *Deleted

## 2018-07-05 DIAGNOSIS — I639 Cerebral infarction, unspecified: Secondary | ICD-10-CM | POA: Diagnosis not present

## 2018-07-06 NOTE — Progress Notes (Signed)
Carelink Summary Report / Loop Recorder 

## 2018-07-16 ENCOUNTER — Other Ambulatory Visit: Payer: Self-pay | Admitting: Family Medicine

## 2018-07-16 DIAGNOSIS — E1169 Type 2 diabetes mellitus with other specified complication: Secondary | ICD-10-CM

## 2018-07-16 DIAGNOSIS — E785 Hyperlipidemia, unspecified: Secondary | ICD-10-CM

## 2018-07-28 LAB — CUP PACEART REMOTE DEVICE CHECK
Implantable Pulse Generator Implant Date: 20180705
MDC IDC SESS DTM: 20191022221108

## 2018-08-04 ENCOUNTER — Other Ambulatory Visit: Payer: Self-pay | Admitting: Internal Medicine

## 2018-08-04 DIAGNOSIS — K317 Polyp of stomach and duodenum: Secondary | ICD-10-CM

## 2018-08-08 ENCOUNTER — Ambulatory Visit (INDEPENDENT_AMBULATORY_CARE_PROVIDER_SITE_OTHER): Payer: BLUE CROSS/BLUE SHIELD

## 2018-08-08 DIAGNOSIS — I639 Cerebral infarction, unspecified: Secondary | ICD-10-CM | POA: Diagnosis not present

## 2018-08-08 NOTE — Progress Notes (Signed)
Carelink Summary Report / Loop Recorder 

## 2018-08-10 NOTE — Progress Notes (Addendum)
[facility]  PATIENT: Rachel Black DOB: Sep 13, 1951   REASON FOR VISIT: follow up for cortical infarct right lobe 03/14/17 HISTORY FROM: Patient and husband    HISTORY OF PRESENT ILLNESS: Rachel Black a 67 y.o.femalewith history of rheumatic fever, hypertension (inconsistently takes her blood pressure medicine), diabetes mellitus and hyperlipidemia admitted on 03/14/2017 for left arm weakness and slurred speech. CT head acute cortical infarct in the high right right total lobe. MRI confirmed acute infarct along the right lateral motor strength. MRA head and a CT neck negative. EF 55-60%. TEE positive PFO but otherwise normal, LE venous Doppler negative for DVT. Loop recorder placed. LDL 45 and A1c 5.4. She was discharged with aspirin 325 and continue the Crestor and fenofibrate.  Interval History10/16/18 Dr. Erlinda Hong During the interval time, the patient has been doing well. Has HH PT/OT and left hand dexterity difficulty resolved. Currently walk with walker. Sugar controlled well at home, but does not check BP at home, today in clinic 115/70. Loop recorder so far no A. fib. UPDATE 5 /22/2019CM Rachel Black, 67 year old female returns for follow-up with history of admission July 2018 for left arm weakness and slurred speech MRI confirmed acute infarct along the right motor strip.  She is on aspirin for secondary stroke prevention without further stroke or TIA symptoms she has minimal bruising and no bleeding.  She remains on Crestor without myalgias.  Recent LDL 54.  She says her blood sugars are well controlled and she checks them at home.  Blood pressure in the office 103/79.  ER admission 12/08/17 for atypical chest pain.  EKG without acute ischemic changes negative troponin.  Patient continues to complain of knee pain, history of bilateral knee surgery.  She is morbidly obese and says she ambulates with a cane at home.  Her therapies have concluded.  Loop recorder so far  no atrial fibrillation.  She returns for reevaluation UPDATE 12/2/2019CM Rachel Black, 67 year old female returns for follow-up with history of stroke event March 14, 2017 she remains on aspirin for secondary stroke prevention without further stroke or TIA symptoms.  She has minimal bruising and no bleeding.  She remains on Crestor without myalgias.  Labs are followed by her primary care provider.  Loop recorder so far no atrial fibrillation.  Diabetes in good control, hemoglobin A1c 6 on 12/21/2017.  Blood pressure in the office today 107/74.  Patient complaints with joint pain in the shoulders and knees.  She has a history of bilateral knee surgery without much improvement.  She is overweight and gets little exercise.  She denies any falls She returns for reevaluation REVIEW OF SYSTEMS: Full 14 system review of systems performed and notable only for those listed, all others are neg:  Constitutional: neg  Cardiovascular: neg Ear/Nose/Throat: neg  Skin: neg Eyes: neg Respiratory: neg Gastroitestinal: neg  Hematology/Lymphatic: neg  Endocrine: neg Musculoskeletal: Knee pain Allergy/Immunology: neg Neurological: neg Psychiatric: neg Sleep : neg   ALLERGIES: Allergies  Allergen Reactions  . Other Other (See Comments)    SOME BANDAIDS CAUSE SKIN IRRITATION  . Penicillins Itching    Has patient had a PCN reaction causing immediate rash, facial/tongue/throat swelling, SOB or lightheadedness with hypotension: No Has patient had a PCN reaction causing severe rash involving mucus membranes or skin necrosis: No Has patient had a PCN reaction that required hospitalization No Has patient had a PCN reaction occurring within the last 10 years: No If all of the above answers are "NO", then may  proceed with Cephalosporin use.     HOME MEDICATIONS: Outpatient Medications Prior to Visit  Medication Sig Dispense Refill  . ACCU-CHEK SOFTCLIX LANCETS lancets Use as instructed once a day.  DX Code: E11.9 100  each 1  . allopurinol (ZYLOPRIM) 300 MG tablet Take 1 tablet (300 mg total) by mouth 2 (two) times daily. 180 tablet 3  . aspirin 81 MG EC tablet Take 1 tablet (81 mg total) by mouth daily.    . Calcium Carb-Cholecalciferol (CALCIUM 600-D PO) Take 1 tablet by mouth 2 (two) times daily before a meal.    . Calcium Carbonate Antacid (TUMS PO) Take 2 tablets by mouth 4 (four) times daily as needed (acid reflux).    Marland Kitchen dexlansoprazole (DEXILANT) 60 MG capsule Take 1 capsule (60 mg total) by mouth 2 (two) times daily. TAKE 1 CAPSULE (60 MG TOTAL) twice a day 180 capsule 1  . dexlansoprazole (DEXILANT) 60 MG capsule Take one capsule twice daily 180 capsule 2  . Emollient (UDDERLY SMOOTH) CREA Apply 1 application topically See admin instructions. Apply topically to skin folds as needed for rash/irritation    . fenofibrate 160 MG tablet TAKE 1 TABLET BY MOUTH EVERY DAY 90 tablet 1  . ferrous sulfate 325 (65 FE) MG tablet TAKE 1 TABLET BY MOUTH TWICE A DAY 180 tablet 3  . glucose blood (ACCU-CHEK AVIVA PLUS) test strip Use as instructed once a day.  DX Code E11.9 100 each 1  . methocarbamol (ROBAXIN) 500 MG tablet TAKE 1 TABLET (500 MG TOTAL) BY MOUTH EVERY 8 (EIGHT) HOURS AS NEEDED FOR MUSCLE SPASMS. 60 tablet 0  . nitroGLYCERIN (NITROSTAT) 0.4 MG SL tablet Place 1 tablet (0.4 mg total) under the tongue every 5 (five) minutes x 3 doses as needed for chest pain. 25 tablet 2  . potassium chloride SA (K-DUR,KLOR-CON) 20 MEQ tablet Take 1 tablet (20 mEq total) by mouth daily. Take as instructed per provider 90 tablet 1  . rosuvastatin (CRESTOR) 40 MG tablet TAKE 1 TABLET BY MOUTH EVERY DAY 90 tablet 1  . senna-docusate (SENOKOT-S) 8.6-50 MG tablet Take 1 tablet by mouth at bedtime as needed for mild constipation. 30 tablet 1  . SitaGLIPtin-MetFORMIN HCl (JANUMET XR) [phone] MG TB24 Take 1 tablet by mouth daily. 90 tablet 1  . valsartan-hydrochlorothiazide (DIOVAN-HCT) 80-12.5 MG tablet TAKE 1 TABLET BY MOUTH  EVERY DAY 90 tablet 1  . isosorbide mononitrate (IMDUR) 30 MG 24 hr tablet Take 1 tablet (30 mg total) by mouth daily. 90 tablet 3   No facility-administered medications prior to visit.     PAST MEDICAL HISTORY: Past Medical History:  Diagnosis Date  . Arthritis    PAIN AND OA BOTH KNEES AND SHOULDERS AND ELBOWS AND WRIST  . Blood transfusion without reported diagnosis 2018   after cholecystectomy  . Diabetes mellitus    ORAL MEDICATION  . GERD (gastroesophageal reflux disease)   . Gout    NO RECENT FLARE UPS  . H/O: rheumatic fever    AS A CHILD - NO KNOWN HEART MURMUR OR HEART PROBLEMS  . Heart attack ([place])   . Hyperlipidemia   . Hypertension   . Stroke [facility])     PAST SURGICAL HISTORY: Past Surgical History:  Procedure Laterality Date  . ABDOMINAL HYSTERECTOMY    . CESAREAN SECTION    . CHOLECYSTECTOMY N/A 12/09/2016   Procedure: LAPAROSCOPIC CHOLECYSTECTOMY WITH INTRAOPERATIVE CHOLANGIOGRAM;  Surgeon: Stark Klein, MD;  Location: [facility];  Service: General;  Laterality: N/A;  .  COLONOSCOPY WITH PROPOFOL N/A 01/30/2013   Procedure: COLONOSCOPY WITH PROPOFOL;  Surgeon: Irene Shipper, MD;  Location: [facility];  Service: Endoscopy;  Laterality: N/A;  . ERCP N/A 12/08/2016   Procedure: ENDOSCOPIC RETROGRADE CHOLANGIOPANCREATOGRAPHY (ERCP);  Surgeon: Ladene Artist, MD;  Location: [facility];  Service: Endoscopy;  Laterality: N/A;  . ESOPHAGOGASTRODUODENOSCOPY (EGD) WITH PROPOFOL N/A 01/30/2013   Procedure: ESOPHAGOGASTRODUODENOSCOPY (EGD) WITH PROPOFOL;  Surgeon: Irene Shipper, MD;  Location: [facility];  Service: Endoscopy;  Laterality: N/A;  . LEFT HEART CATH AND CORONARY ANGIOGRAPHY N/A 10/18/2017   Procedure: LEFT HEART CATH AND CORONARY ANGIOGRAPHY;  Surgeon: Martinique, Peter M, MD;  Location: [facility];  Service: Cardiovascular;  Laterality: N/A;  . LOOP RECORDER INSERTION N/A 03/18/2017   Procedure: Loop Recorder Insertion;  Surgeon: Constance Haw, MD;   Location: [facility];  Service: Cardiovascular;  Laterality: N/A;  . PARTIAL HYSTERECTOMY    . TEE WITHOUT CARDIOVERSION N/A 03/16/2017   Procedure: TRANSESOPHAGEAL ECHOCARDIOGRAM (TEE);  Surgeon: Acie Fredrickson Wonda Cheng, MD;  Location: [facility];  Service: Cardiovascular;  Laterality: N/A;  . TOTAL KNEE ARTHROPLASTY Right 04/20/2014   Procedure: RIGHT TOTAL KNEE ARTHROPLASTY CONVERTED TO RIGHT KNEE REIMPLANTATION;  Surgeon: Mcarthur Rossetti, MD;  Location: [facility];  Service: Orthopedics;  Laterality: Right;  . TOTAL KNEE ARTHROPLASTY Left 08/23/2015   Procedure: LEFT TOTAL KNEE ARTHROPLASTY;  Surgeon: Mcarthur Rossetti, MD;  Location: [facility];  Service: Orthopedics;  Laterality: Left;  Marland Kitchen VENTRAL HERNIA REPAIR     x2    FAMILY HISTORY: Family History  Problem Relation Age of Onset  . Diabetes Mother   . Hypertension Mother        entire family  . Breast cancer Mother        diagnosed in her 63's  . Stroke Father        CVA  . Hyperlipidemia Father        entire family  . Diabetes Father   . Heart disease Father        No details.  Not at an early age.    . Crohn's disease Son   . Irritable bowel syndrome Son     SOCIAL HISTORY: Social History   Socioeconomic History  . Marital status: Married    Spouse name: Not on file  . Number of children: 2  . Years of education: Not on file  . Highest education level: Not on file  Occupational History  . Occupation: housewife    Employer: UNEMPLOYED  Social Needs  . Financial resource strain: Not on file  . Food insecurity:    Worry: Not on file    Inability: Not on file  . Transportation needs:    Medical: Not on file    Non-medical: Not on file  Tobacco Use  . Smoking status: Never Smoker  . Smokeless tobacco: Never Used  Substance and Sexual Activity  . Alcohol use: No  . Drug use: No  . Sexual activity: Yes    Partners: Male  Lifestyle  . Physical activity:    Days per week: Not on file    Minutes per  session: Not on file  . Stress: Not on file  Relationships  . Social connections:    Talks on phone: Not on file    Gets together: Not on file    Attends religious service: Not on file    Active member of club or organization: Not on file  Attends meetings of clubs or organizations: Not on file    Relationship status: Not on file  . Intimate partner violence:    Fear of current or ex partner: Not on file    Emotionally abused: Not on file    Physically abused: Not on file    Forced sexual activity: Not on file  Other Topics Concern  . Not on file  Social History Narrative   No exercise secondary to knee pain     PHYSICAL EXAM  Vitals:   08/15/18 1423  BP: 107/74  Pulse: 85  Weight: 262 lb 9.6 oz (119.1 kg)  Height: 5\' 6"  (1.676 m)   Body mass index is 42.38 kg/m.  Generalized: Well developed, morbidly obese female in no acute distress  Head: normocephalic and atraumatic,. Oropharynx benign  Neck: Supple, no carotid bruits  Cardiac: Regular rate rhythm, no murmur  Musculoskeletal: No deformity   Neurological examination   Mentation: Alert oriented to time, place, history taking. Attention span and concentration appropriate. Recent and remote memory intact.  Follows all commands speech and language fluent.   Cranial nerve II-XII: Pupils were equal round reactive to light extraocular movements were full, visual field were full on confrontational test. Facial sensation and strength were normal. hearing was intact to finger rubbing bilaterally. Uvula tongue midline. head turning and shoulder shrug were normal and symmetric.Tongue protrusion into cheek strength was normal. Motor: normal bulk and tone, full strength in the BUE, BLE, except chronic left foot drop  Sensory: normal and symmetric to light touch, pinprick, and  Vibration, in the upper and lower extremity Coordination: finger-nose-finger, heel-to-shin bilaterally, no dysmetria Reflexes: 1+ upper lower and  symmetric plantar responses were flexor bilaterally. Gait and Station: Rising up from seated position slow steady gait no assistive device.  DIAGNOSTIC DATA (LABS, IMAGING, TESTING) - I reviewed patient records, labs, notes, testing and imaging myself where available.  Lab Results  Component Value Date   WBC 6.8 12/21/2017   HGB 12.5 12/21/2017   HCT 38.0 12/21/2017   MCV 94.4 12/21/2017   PLT 134.0 (L) 12/21/2017      Component Value Date/Time   NA 141 12/21/2017 1044   K 5.1 12/21/2017 1044   CL 105 12/21/2017 1044   CO2 28 12/21/2017 1044   GLUCOSE 146 (H) 12/21/2017 1044   BUN 22 12/21/2017 1044   CREATININE 1.42 (H) 12/21/2017 1044   CALCIUM 10.2 12/21/2017 1044   PROT 6.7 12/21/2017 1044   ALBUMIN 4.3 12/21/2017 1044   AST 24 12/21/2017 1044   ALT 21 12/21/2017 1044   ALKPHOS 34 (L) 12/21/2017 1044   BILITOT 0.4 12/21/2017 1044   GFRNONAA 38 (L) 12/08/2017 1304   GFRAA 44 (L) 12/08/2017 1304   Lab Results  Component Value Date   CHOL 127 12/21/2017   HDL 32.50 (L) 12/21/2017   LDLCALC 54 10/17/2017   LDLDIRECT 50.0 12/21/2017   TRIG 299.0 (H) 12/21/2017   CHOLHDL 4 12/21/2017   Lab Results  Component Value Date   HGBA1C 6.0 12/21/2017   No results found for: VITAMINB12 Lab Results  Component Value Date   TSH 2.863 10/16/2017      ASSESSMENT AND PLAN  67y.o.  female with PMH of rheumatic fever, hypertension  diabetes mellitus and hyperlipidemia admitted on 03/14/2017 for acute infarct along the right lateral motor strength. MRA head and a CT neck negative. EF 55-60%. TEE positive PFO but otherwise normal, LE venous Doppler negative for DVT. Loop recorder placed. LDL 45  and A1c 5.4. She was discharged with aspirin and continue the Crestor and fenofibrate.  Therapies have concluded.Still with mild  dexterity Left hand.   Loop recorder so far no A. Fib.The patient is a current patient of Dr. Erlinda Hong  Who has left the practice.  This note is sent to the work in  doctor.    Plan:   Continue ASA and crestor for stroke prevention   Continue loop recorder monitoring.No far no atrial fib  Continue to glucose at home and record.  Follow up with your primary care physician for stroke risk factor modification. Recommend maintain blood pressure goal <130/80, diabetes with hemoglobin A1c goal below 7.0% and lipids with LDL cholesterol goal below 70 mg/dL.   healthy diet and regular exercise Continue follow-up with cardiology yearly Will discharge from stroke clinic I spent 25 min  in total face to face time with the patient more than 50% of which was spent counseling and coordination of care, reviewing test results reviewing medications and discussing and reviewing the diagnosis of stroke and management of risk factors.  Patient was also given a written handout for review at a later date. Dennie Bible, [facility], [facility], APRN  [facility] [address], [place], [place] [address] [phone]  I reviewed the above note and documentation by the Nurse Practitioner and agree with the history, physical exam, assessment and plan as outlined above. I was immediately available for face-to-face consultation. Star Age, MD, PhD [facility])

## 2018-08-15 ENCOUNTER — Encounter: Payer: Self-pay | Admitting: Nurse Practitioner

## 2018-08-15 ENCOUNTER — Ambulatory Visit (INDEPENDENT_AMBULATORY_CARE_PROVIDER_SITE_OTHER): Payer: BLUE CROSS/BLUE SHIELD | Admitting: Nurse Practitioner

## 2018-08-15 ENCOUNTER — Other Ambulatory Visit: Payer: Self-pay

## 2018-08-15 VITALS — BP 107/74 | HR 85 | Ht 66.0 in | Wt 262.6 lb

## 2018-08-15 DIAGNOSIS — I208 Other forms of angina pectoris: Secondary | ICD-10-CM

## 2018-08-15 DIAGNOSIS — E1159 Type 2 diabetes mellitus with other circulatory complications: Secondary | ICD-10-CM | POA: Diagnosis not present

## 2018-08-15 DIAGNOSIS — Z794 Long term (current) use of insulin: Secondary | ICD-10-CM

## 2018-08-15 DIAGNOSIS — I63411 Cerebral infarction due to embolism of right middle cerebral artery: Secondary | ICD-10-CM | POA: Diagnosis not present

## 2018-08-15 DIAGNOSIS — E782 Mixed hyperlipidemia: Secondary | ICD-10-CM | POA: Diagnosis not present

## 2018-08-15 MED ORDER — DEXLANSOPRAZOLE 60 MG PO CPDR: DELAYED_RELEASE_CAPSULE | ORAL | 2 refills | Status: DC

## 2018-08-15 MED ORDER — DEXLANSOPRAZOLE 60 MG PO CPDR: 60.0000 mg | DELAYED_RELEASE_CAPSULE | Freq: Two times a day (BID) | ORAL | 1 refills | Status: DC

## 2018-08-15 NOTE — Patient Instructions (Addendum)
Continue ASA and crestor for stroke prevention   Continue loop recorder monitoring.No far no atrial fib  Continue to glucose at home and record.  Follow up with your primary care physician for stroke risk factor modification. Recommend maintain blood pressure goal <130/80, diabetes with hemoglobin A1c goal below 7.0% and lipids with LDL cholesterol goal below 70 mg/dL.   healthy diet and regular exercise Continue follow-up with cardiology yearly Will discharge from stroke clinic   Stroke Prevention Some health problems and behaviors may make it more likely for you to have a stroke. Below are ways to lessen your risk of having a stroke.  Be active for at least 30 minutes on most or all days.  Do not smoke. Try not to be around others who smoke.  Do not drink too much alcohol. ? Do not have more than 2 drinks a day if you are a man. ? Do not have more than 1 drink a day if you are a woman and are not pregnant.  Eat healthy foods, such as fruits and vegetables. If you were put on a specific diet, follow the diet as told.  Keep your cholesterol levels under control through diet and medicines. Look for foods that are low in saturated fat, trans fat, cholesterol, and are high in fiber.  If you have diabetes, follow all diet plans and take your medicine as told.  Ask your doctor if you need treatment to lower your blood pressure. If you have high blood pressure (hypertension), follow all diet plans and take your medicine as told by your doctor.  If you are 26-68 years old, have your blood pressure checked every 3-5 years. If you are age 62 or older, have your blood pressure checked every year.  Keep a healthy weight. Eat foods that are low in calories, salt, saturated fat, trans fat, and cholesterol.  Do not take drugs.  Avoid birth control pills, if this applies. Talk to your doctor about the risks of taking birth control pills.  Talk to your doctor if you have sleep problems (sleep  apnea).  Take all medicine as told by your doctor. ? You may be told to take aspirin or blood thinner medicine. Take this medicine as told by your doctor. ? Understand your medicine instructions.  Make sure any other conditions you have are being taken care of.  Get help right away if:  You suddenly lose feeling (you feel numb) or have weakness in your face, arm, or leg.  Your face or eyelid hangs down to one side.  You suddenly feel confused.  You have trouble talking (aphasia) or understanding what people are saying.  You suddenly have trouble seeing in one or both eyes.  You suddenly have trouble walking.  You are dizzy.  You lose your balance or your movements are clumsy (uncoordinated).  You suddenly have a very bad headache and you do not know the cause.  You have new chest pain.  Your heart feels like it is fluttering or skipping a beat (irregular heartbeat). Do not wait to see if the symptoms above go away. Get help right away. Call your local emergency services (911 in [place] Do not drive yourself to the hospital. This information is not intended to replace advice given to you by your health care provider. Make sure you discuss any questions you have with your health care provider. Document Released: 03/01/2012 Document Revised: 02/06/2016 Document Reviewed: 03/03/2013 Elsevier Interactive Patient Education  [employer].

## 2018-08-19 ENCOUNTER — Telehealth: Payer: Self-pay | Admitting: Family Medicine

## 2018-08-19 DIAGNOSIS — I10 Essential (primary) hypertension: Secondary | ICD-10-CM

## 2018-08-19 MED ORDER — VALSARTAN-HYDROCHLOROTHIAZIDE 80-12.5 MG PO TABS: 1.0000 | ORAL_TABLET | Freq: Every day | ORAL | 0 refills | Status: DC

## 2018-08-19 NOTE — Telephone Encounter (Signed)
Patient calling and states that the insurance will not cover the valsartan-hydrochlorothiazide (DIOVAN-HCT) 80-12.5 MG tablet That was sent in to the pharmacy. Please advise. Would like another medication sent to the pharmacy.

## 2018-08-19 NOTE — Telephone Encounter (Signed)
Copied from [place] [phone]. Topic: Quick Communication - See Telephone Encounter >> Aug 19, 2018 11:05 AM Blase Mess A wrote: CRM for notification. See Telephone encounter for: 08/19/18.  Patient is calling regarding the valsartan-hydrochlorothiazide (DIOVAN-HCT) 80-12.5 MG tablet [[id number]]   The pharmacy is asking for a new script of something else because they do not know when they will receive the medication

## 2018-08-19 NOTE — Telephone Encounter (Signed)
Spoke with [facility], they are getting clarification / additional details.

## 2018-08-19 NOTE — Telephone Encounter (Signed)
Called pharmacy to see if they could separate, but they do not have the diovan 80mg  in stock either.  Please advise.

## 2018-08-19 NOTE — Telephone Encounter (Signed)
Copied from [place] [phone]. Topic: Quick Communication - See Telephone Encounter >> Aug 19, 2018 10:41 AM Blase Mess A wrote: CRM for notification. See Telephone encounter for: 08/19/18.

## 2018-08-19 NOTE — Telephone Encounter (Signed)
Contacted the med center [facility].  They have her dose of Diovan HCT in stock.  Spoke to patient and husband.  He will come and pick up the prescription prior to pharmacy closing at 6 PM tonight.

## 2018-08-22 NOTE — Telephone Encounter (Signed)
Pt husband is calling and valsartan-hctz is not covered and requesting new medication to be sent to [facility] [address]

## 2018-08-22 NOTE — Telephone Encounter (Signed)
Medication not covered.

## 2018-08-22 NOTE — Telephone Encounter (Signed)
benicar 20/12.5  #30  1 po qd, 2 refills bp check 2-3 weeks

## 2018-08-23 MED ORDER — OLMESARTAN MEDOXOMIL-HCTZ 20-12.5 MG PO TABS: 1.0000 | ORAL_TABLET | Freq: Every day | ORAL | 2 refills | Status: DC

## 2018-08-23 NOTE — Addendum Note (Signed)
Addended by: Kem Boroughs D on: 08/23/2018 06:03 PM   Modules accepted: Orders

## 2018-08-23 NOTE — Telephone Encounter (Signed)
Medication sent in and appt made for patient to recheck.

## 2018-09-08 ENCOUNTER — Telehealth: Payer: Self-pay | Admitting: Internal Medicine

## 2018-09-08 NOTE — Telephone Encounter (Signed)
Spoke with patient and told her I would put enough Dexilant samples up front to get her through until 1/25.  Patient agreed.

## 2018-09-08 NOTE — Telephone Encounter (Signed)
Pt says she's ran out of Dexilant.  Insurance will not cover until 1/25.

## 2018-09-09 ENCOUNTER — Ambulatory Visit (INDEPENDENT_AMBULATORY_CARE_PROVIDER_SITE_OTHER): Payer: BLUE CROSS/BLUE SHIELD

## 2018-09-09 DIAGNOSIS — I639 Cerebral infarction, unspecified: Secondary | ICD-10-CM

## 2018-09-11 LAB — CUP PACEART REMOTE DEVICE CHECK
Implantable Pulse Generator Implant Date: 20180705
MDC IDC SESS DTM: 20191227230911

## 2018-09-12 NOTE — Progress Notes (Signed)
Carelink Summary Report / Loop Recorder 

## 2018-09-15 ENCOUNTER — Ambulatory Visit (INDEPENDENT_AMBULATORY_CARE_PROVIDER_SITE_OTHER): Payer: BLUE CROSS/BLUE SHIELD | Admitting: Family Medicine

## 2018-09-15 VITALS — BP 101/68 | HR 85

## 2018-09-15 DIAGNOSIS — I10 Essential (primary) hypertension: Secondary | ICD-10-CM | POA: Diagnosis not present

## 2018-09-15 MED ORDER — OLMESARTAN MEDOXOMIL 20 MG PO TABS: 20.0000 mg | ORAL_TABLET | Freq: Every day | ORAL | 1 refills | Status: DC

## 2018-09-15 NOTE — Progress Notes (Signed)
Pre visit review using our clinic tool,if applicable. No additional management support is needed unless otherwise documented below in the visit note.   Pt here for Blood pressure check per   Pt currently takes: Benicar 20/12.5 daily  Pt reports compliance with medication.  BP today  = 101/68 HR = 85  Pt advised per Dr. Carollee Herter start Benicar 20 mg without HCTZ. Return in 6-8 weeks to see provider. Come sooner if symptomatic.

## 2018-09-25 LAB — CUP PACEART REMOTE DEVICE CHECK
Date Time Interrogation Session: 20191124224038
Implantable Pulse Generator Implant Date: 20180705

## 2018-09-26 ENCOUNTER — Telehealth: Payer: Self-pay | Admitting: Internal Medicine

## 2018-09-26 IMAGING — CT CT ANGIO NECK
2 of 7 series · 8 of 33 positions shown · IV contrast (OMNI 350)
Comparison: None.

CLINICAL DATA: Stroke with left arm numbness.

EXAM:
CT ANGIOGRAPHY NECK
TECHNIQUE: Multidetector CT imaging of the neck was performed using the
standard protocol during bolus administration of intravenous
contrast. Multiplanar CT image reconstructions and MIPs were
obtained to evaluate the vascular anatomy. Carotid stenosis
measurements (when applicable) are obtained utilizing NASCET
criteria, using the distal internal carotid diameter as the
denominator.
CONTRAST:  50 cc Isovue 370 intravenous

[Series 5: cta neck · axial · 0.52mm/px · z∈[-306,-236]mm · 2 of 106 slices shown]
[im 36/106  soft-tissue]
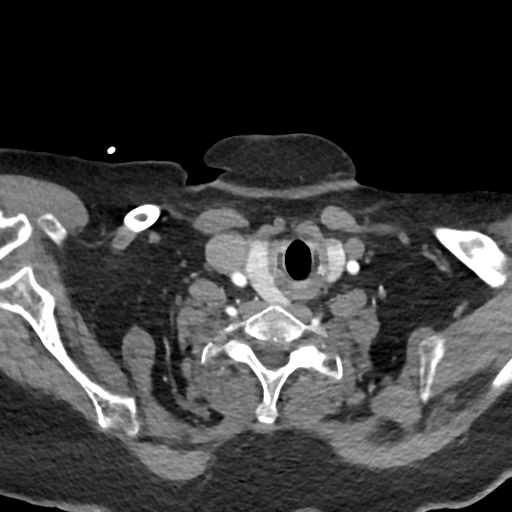
[im 71/106  soft-tissue]
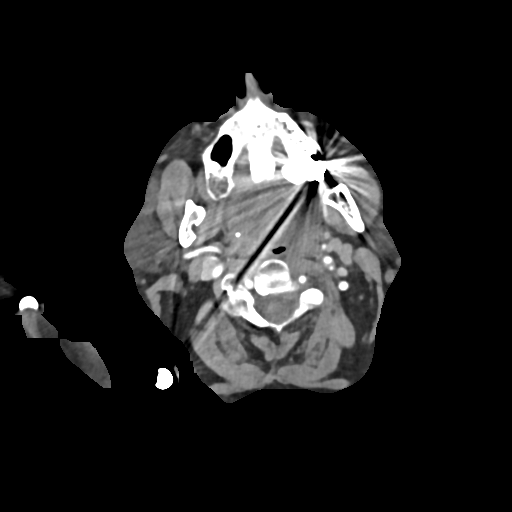

[Series 7: cta neck axial · axial · 0.40mm/px · z∈[-392,-249]mm · 6 of 211 slices shown]
[im 31/211  soft-tissue]
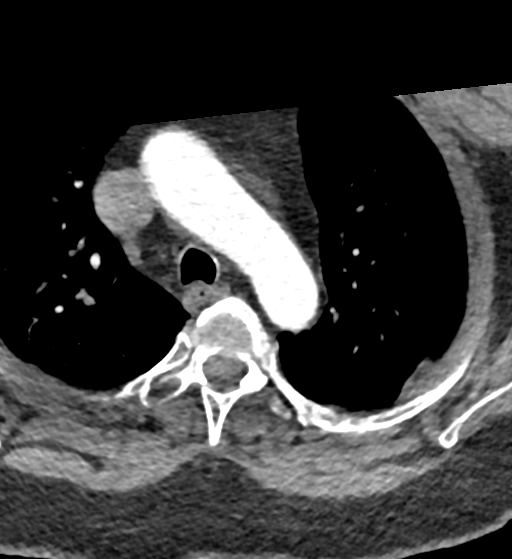
[im 61/211  bone]
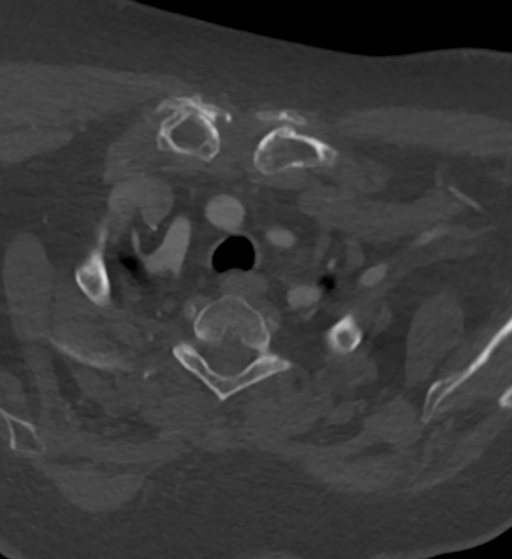
[im 91/211  soft-tissue]
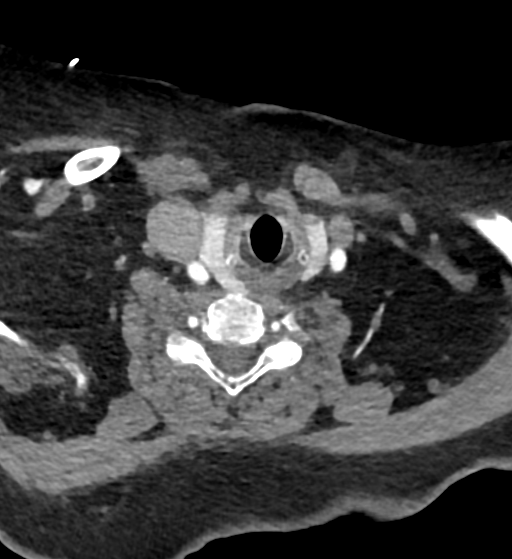
[im 121/211  bone]
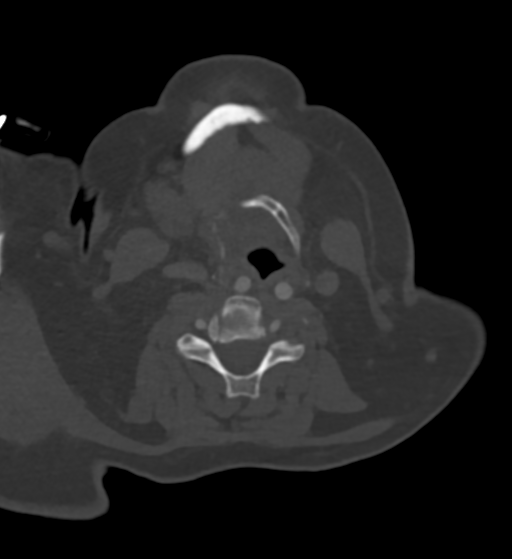
[im 151/211  soft-tissue]
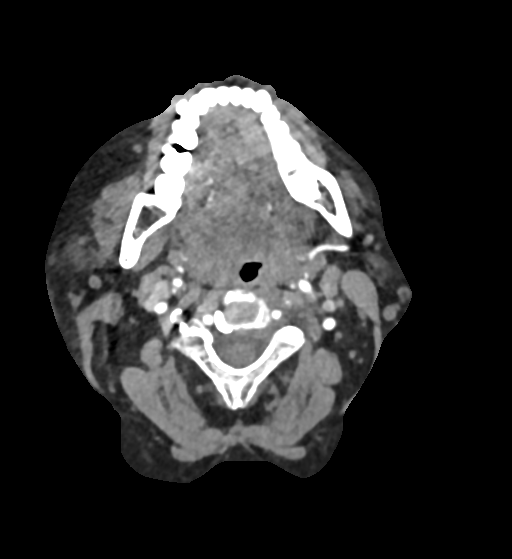
[im 181/211  bone]
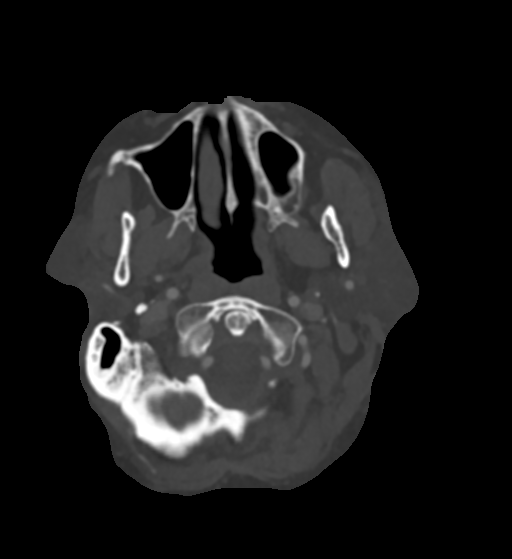

[8 of 33 positions shown; findings below may reference images not displayed]

FINDINGS: Aortic arch: Normal appearance of the arch.  Three vessel branching.

Right carotid system: Smooth and widely patent. Common carotid
tortuosity with partial retropharyngeal course. Negative for
beading.

Left carotid system: Smooth and widely patent. Common carotid
tortuosity with partial retropharyngeal course. Negative for
beading.

Vertebral arteries: No proximal subclavian stenosis. Both vertebral
arteries are smooth and widely patent to the dura.

Skeleton: No acute or aggressive finding. Facet arthropathy with
slight anterolisthesis at C3-4.

Other neck: No incidental mass or inflammation.

Upper chest: No acute finding.
IMPRESSION: Negative CTA of the neck. No stenosis, atherosclerosis, or embolic
source.

## 2018-09-26 NOTE — Telephone Encounter (Signed)
Pt has appt 1.15.20 with Tye Savoy.  Pt is having heavy rectal bleeding and is concerned.  Please CB to advise.

## 2018-09-26 NOTE — Telephone Encounter (Signed)
BRBPR with BM started this morning.  This is new for her. No history of hemorrhoids. No constipation or diarrhea.  Some abd discomfort.  Has appt for tomorrow at 2 pm with Tye Savoy.  She will go to the ED if bleeding becomes heavy.

## 2018-09-27 ENCOUNTER — Ambulatory Visit: Payer: BLUE CROSS/BLUE SHIELD | Admitting: Nurse Practitioner

## 2018-09-27 ENCOUNTER — Encounter: Payer: Self-pay | Admitting: Nurse Practitioner

## 2018-09-27 VITALS — BP 118/78 | HR 70 | Ht 66.0 in | Wt 259.2 lb

## 2018-09-27 DIAGNOSIS — K625 Hemorrhage of anus and rectum: Secondary | ICD-10-CM | POA: Diagnosis not present

## 2018-09-27 DIAGNOSIS — K648 Other hemorrhoids: Secondary | ICD-10-CM | POA: Diagnosis not present

## 2018-09-27 MED ORDER — HYDROCORTISONE 2.5 % RE CREA: 1.0000 "application " | TOPICAL_CREAM | Freq: Every day | RECTAL | 0 refills | Status: DC

## 2018-09-27 NOTE — Progress Notes (Signed)
Reviewed

## 2018-09-27 NOTE — Patient Instructions (Signed)
If you are age 67 or older, your body mass index should be between 23-30. Your Body mass index is 41.84 kg/m. If this is out of the aforementioned range listed, please consider follow up with your Primary Care Provider.  If you are age 77 or younger, your body mass index should be between 19-25. Your Body mass index is 41.84 kg/m. If this is out of the aformentioned range listed, please consider follow up with your Primary Care Provider.   We have sent the following medications to your pharmacy for you to pick up at your convenience: Anusol Cream  Start Benefiber daily (over-the-counter).  Call in 10-14 days with an update.  Thank you for choosing me and [facility].   Tye Savoy, NP

## 2018-09-27 NOTE — Progress Notes (Signed)
Chief Complaint:    Rectal bleeding  IMPRESSION and PLAN:    68 yo female with multiple medical problems presenting with painless rectal bleeding yesterday, now resolved. No blood in stool today. Small Internal hemorrhoids on anoscopy, probably the source of bleeding.  -She denies constipation / straining but given the internal hemorrhoids I think she try daily benefiber.  -anusol cream PR QHS x 7  -While patient is no longer a candidate for polyp surveillance / screening colonoscopies, unexplained rectal bleeding warrants further workup. She will call us in 10-14 days with a condition update.   ? NASH cirrhosis. No evidence for portal HTN on EGD in 2018.   HPI:     Patient is a 68 year old female with multiple medical problems not limited to HTN, morbid obesity, limited mobility (in wheelchair),  DM, history of CVA, probable Karlene Lineman cirrhosis, and CAD/ MI,. She is known to Dr. Henrene Pastor for history of GERD, adenomatous colon polyps, iron deficiency anemia secondary to bleeding gastric fundic gland polyps.  She had a small TA removed at time of last colonoscopy in 2014. A 5-year surveillance colonoscopy was recommended. At time of her May 2019 visit however, it was decided that patient was no longer surveillance candidate due to her multiple co-morbidities.   Ms. Quintela comes in today with her husband for evaluation of bright red blood per rectum.  The bleeding occurred yesterday, not really associated with bowel movements. She saw the blood on her underpad a few times yesterday. No associated abdominal or rectal pain. BMs described as normal other than being black on iron. She has not been constipated/straining.  Today she had some loose stool which is unusual for her. She takes a daily ASA, no other nsaids or blood thinners.    Review of systems:     No chest pain, no SOB, no fevers, no urinary sx   Past Medical History:  Diagnosis Date  . Arthritis    PAIN AND OA BOTH KNEES AND  SHOULDERS AND ELBOWS AND WRIST  . Blood transfusion without reported diagnosis 2018   after cholecystectomy  . Diabetes mellitus    ORAL MEDICATION  . GERD (gastroesophageal reflux disease)   . Gout    NO RECENT FLARE UPS  . H/O: rheumatic fever    AS A CHILD - NO KNOWN HEART MURMUR OR HEART PROBLEMS  . Heart attack ([place])   . Hyperlipidemia   . Hypertension   . Stroke [facility])     Patient's surgical history, family medical history, social history, medications and allergies were all reviewed in Epic   Creatinine clearance cannot be calculated (Patient's most recent lab result is older than the maximum 21 days allowed.)  Current Outpatient Medications  Medication Sig Dispense Refill  . ACCU-CHEK SOFTCLIX LANCETS lancets Use as instructed once a day.  DX Code: E11.9 100 each 1  . allopurinol (ZYLOPRIM) 300 MG tablet Take 1 tablet (300 mg total) by mouth 2 (two) times daily. 180 tablet 3  . aspirin 81 MG EC tablet Take 1 tablet (81 mg total) by mouth daily.    . Calcium Carb-Cholecalciferol (CALCIUM 600-D PO) Take 1 tablet by mouth 2 (two) times daily before a meal.    . Calcium Carbonate Antacid (TUMS PO) Take 2 tablets by mouth 4 (four) times daily as needed (acid reflux).    Marland Kitchen dexlansoprazole (DEXILANT) 60 MG capsule Take 1 capsule (60 mg total) by mouth 2 (two) times daily. TAKE 1 CAPSULE (60  MG TOTAL) twice a day 180 capsule 1  . Emollient (UDDERLY SMOOTH) CREA Apply 1 application topically See admin instructions. Apply topically to skin folds as needed for rash/irritation    . fenofibrate 160 MG tablet TAKE 1 TABLET BY MOUTH EVERY DAY 90 tablet 1  . ferrous sulfate 325 (65 FE) MG tablet TAKE 1 TABLET BY MOUTH TWICE A DAY 180 tablet 3  . glucose blood (ACCU-CHEK AVIVA PLUS) test strip Use as instructed once a day.  DX Code E11.9 100 each 1  . methocarbamol (ROBAXIN) 500 MG tablet TAKE 1 TABLET (500 MG TOTAL) BY MOUTH EVERY 8 (EIGHT) HOURS AS NEEDED FOR MUSCLE SPASMS. 60 tablet 0  .  nitroGLYCERIN (NITROSTAT) 0.4 MG SL tablet Place 1 tablet (0.4 mg total) under the tongue every 5 (five) minutes x 3 doses as needed for chest pain. 25 tablet 2  . olmesartan (BENICAR) 20 MG tablet Take 1 tablet (20 mg total) by mouth daily. 90 tablet 1  . potassium chloride SA (K-DUR,KLOR-CON) 20 MEQ tablet Take 1 tablet (20 mEq total) by mouth daily. Take as instructed per provider 90 tablet 1  . rosuvastatin (CRESTOR) 40 MG tablet TAKE 1 TABLET BY MOUTH EVERY DAY 90 tablet 1  . senna-docusate (SENOKOT-S) 8.6-50 MG tablet Take 1 tablet by mouth at bedtime as needed for mild constipation. 30 tablet 1  . SitaGLIPtin-MetFORMIN HCl (JANUMET XR) [phone] MG TB24 Take 1 tablet by mouth daily. 90 tablet 1  . isosorbide mononitrate (IMDUR) 30 MG 24 hr tablet Take 1 tablet (30 mg total) by mouth daily. 90 tablet 3   No current facility-administered medications for this visit.     Physical Exam:     BP 118/78   Pulse 70   Ht 5' 6"  (1.676 m)   Wt 259 lb 3.2 oz (117.6 kg)   SpO2 96%   BMI 41.84 kg/m   GENERAL:  Pleasant female in NAD PSYCH: : Cooperative, normal affect EENT:  conjunctiva pink, mucous membranes moist, neck supple without masses CARDIAC:  RRR,  no peripheral edema PULM: Normal respiratory effort, lungs CTA bilaterally, no wheezing ABDOMEN:  Nondistended, soft, nontender. No obvious masses, normal bowel sounds RECTAL:  External hemorrhoidal tags. On anoscopy there were small grade 1 hemorrhoids.  SKIN:  turgor, no lesions seen Musculoskeletal:  Normal muscle tone, normal strength NEURO: Alert and oriented x 3, no focal neurologic deficits   Tye Savoy , NP 09/27/2018, 2:23 PM

## 2018-09-28 ENCOUNTER — Ambulatory Visit: Payer: BLUE CROSS/BLUE SHIELD | Admitting: Nurse Practitioner

## 2018-10-06 ENCOUNTER — Telehealth: Payer: Self-pay | Admitting: Nurse Practitioner

## 2018-10-06 NOTE — Telephone Encounter (Signed)
Pt called to update Rachel Black on her progress. She stated that she is feeling much better, she has been applying the cream that she prescribed and it seems to be working.

## 2018-10-11 ENCOUNTER — Telehealth: Payer: Self-pay | Admitting: Internal Medicine

## 2018-10-11 NOTE — Telephone Encounter (Signed)
Pt states pharmacy will not refill dexilant.  She is not sure why.

## 2018-10-12 ENCOUNTER — Ambulatory Visit (INDEPENDENT_AMBULATORY_CARE_PROVIDER_SITE_OTHER): Payer: BLUE CROSS/BLUE SHIELD

## 2018-10-12 DIAGNOSIS — I639 Cerebral infarction, unspecified: Secondary | ICD-10-CM | POA: Diagnosis not present

## 2018-10-13 NOTE — Progress Notes (Signed)
Carelink Summary Report / Loop Recorder 

## 2018-10-14 LAB — CUP PACEART REMOTE DEVICE CHECK
Implantable Pulse Generator Implant Date: 20180705
MDC IDC SESS DTM: 20200129234000

## 2018-10-14 NOTE — Telephone Encounter (Signed)
Informed by pharmacy that twice a day Dexilant would need a prior authorization.  I submitted this through [employer].  Spoke to patient to update her.  She is out of Dexilant - offered her samples but she said no one would be able to come to our office to pick them up.  I will call her as soon as I hear back from [employer].  Patient agreed.

## 2018-10-18 NOTE — Telephone Encounter (Signed)
Checked on prior authorization status for Dexilant.  Spoke with a rep at [employer] who said it is still in process but they will call me if they need any more information.

## 2018-10-19 NOTE — Telephone Encounter (Signed)
Received approval for Dexilant BID from [facility].  Called pharmacy and had them rerun the rx.  Called patient to let her know they would fill it.  Patient agreed.

## 2018-10-24 NOTE — Telephone Encounter (Signed)
Discussed with the patient.

## 2018-10-24 NOTE — Telephone Encounter (Signed)
Patient reports blood with her bowel movement last night. States may have had some firm stool and may have had to sit on the commode for more than 10 minutes a few times. Overall she does not feel she has been constipated. Today her stools are loose. Bowel movements is dark as is her usual. Not black or tarry.  She denies pain, nausea or fever. She has started using the cream again last night.  Please advise.

## 2018-10-24 NOTE — Telephone Encounter (Signed)
Pt states that she started bleeding again and would like to know if she should start using the cream again to get it to stop.

## 2018-10-24 NOTE — Telephone Encounter (Signed)
Please ask her to take daily benefiber, try not to sit on commode for more than a few minutes. Needs to drink at least 8 glasses water daily. Can take another 7-10 days of anusol cream PR at night. Then if recurrent bleeding I should see her back to banding and +/- colonoscopy. Thanks

## 2018-11-04 ENCOUNTER — Ambulatory Visit: Payer: BLUE CROSS/BLUE SHIELD | Admitting: Family Medicine

## 2018-11-04 ENCOUNTER — Encounter: Payer: Self-pay | Admitting: Family Medicine

## 2018-11-04 VITALS — BP 127/58 | HR 68 | Resp 14 | Ht 65.0 in | Wt 259.0 lb

## 2018-11-04 DIAGNOSIS — E1165 Type 2 diabetes mellitus with hyperglycemia: Secondary | ICD-10-CM

## 2018-11-04 DIAGNOSIS — E1169 Type 2 diabetes mellitus with other specified complication: Secondary | ICD-10-CM

## 2018-11-04 DIAGNOSIS — E785 Hyperlipidemia, unspecified: Secondary | ICD-10-CM

## 2018-11-04 DIAGNOSIS — E1151 Type 2 diabetes mellitus with diabetic peripheral angiopathy without gangrene: Secondary | ICD-10-CM

## 2018-11-04 DIAGNOSIS — I10 Essential (primary) hypertension: Secondary | ICD-10-CM

## 2018-11-04 DIAGNOSIS — M1009 Idiopathic gout, multiple sites: Secondary | ICD-10-CM

## 2018-11-04 DIAGNOSIS — E782 Mixed hyperlipidemia: Secondary | ICD-10-CM

## 2018-11-04 DIAGNOSIS — IMO0002 Reserved for concepts with insufficient information to code with codable children: Secondary | ICD-10-CM

## 2018-11-04 MED ORDER — ROSUVASTATIN CALCIUM 40 MG PO TABS: 40.0000 mg | ORAL_TABLET | Freq: Every day | ORAL | 1 refills | Status: DC

## 2018-11-04 MED ORDER — OLMESARTAN MEDOXOMIL 5 MG PO TABS: 10.0000 mg | ORAL_TABLET | Freq: Every day | ORAL | 2 refills | Status: DC

## 2018-11-04 MED ORDER — SITAGLIP PHOS-METFORMIN HCL ER 100-1000 MG PO TB24: 1.0000 | ORAL_TABLET | Freq: Every day | ORAL | 1 refills | Status: DC

## 2018-11-04 MED ORDER — FENOFIBRATE 160 MG PO TABS: 160.0000 mg | ORAL_TABLET | Freq: Every day | ORAL | 1 refills | Status: DC

## 2018-11-04 NOTE — Patient Instructions (Signed)
DASH Eating Plan  DASH stands for "Dietary Approaches to Stop Hypertension." The DASH eating plan is a healthy eating plan that has been shown to reduce high blood pressure (hypertension). It may also reduce your risk for type 2 diabetes, heart disease, and stroke. The DASH eating plan may also help with weight loss.  What are tips for following this plan?    General guidelines   Avoid eating more than 2,300 mg (milligrams) of salt (sodium) a day. If you have hypertension, you may need to reduce your sodium intake to 1,500 mg a day.   Limit alcohol intake to no more than 1 drink a day for nonpregnant women and 2 drinks a day for men. One drink equals 12 oz of beer, 5 oz of wine, or 1 oz of hard liquor.   Work with your health care provider to maintain a healthy body weight or to lose weight. Ask what an ideal weight is for you.   Get at least 30 minutes of exercise that causes your heart to beat faster (aerobic exercise) most days of the week. Activities may include walking, swimming, or biking.   Work with your health care provider or diet and nutrition specialist (dietitian) to adjust your eating plan to your individual calorie needs.  Reading food labels     Check food labels for the amount of sodium per serving. Choose foods with less than 5 percent of the Daily Value of sodium. Generally, foods with less than 300 mg of sodium per serving fit into this eating plan.   To find whole grains, look for the word "whole" as the first word in the ingredient list.  Shopping   Buy products labeled as "low-sodium" or "no salt added."   Buy fresh foods. Avoid canned foods and premade or frozen meals.  Cooking   Avoid adding salt when cooking. Use salt-free seasonings or herbs instead of table salt or sea salt. Check with your health care provider or pharmacist before using salt substitutes.   Do not fry foods. Cook foods using healthy methods such as baking, boiling, grilling, and broiling instead.   Cook with  heart-healthy oils, such as olive, canola, soybean, or sunflower oil.  Meal planning   Eat a balanced diet that includes:  ? 5 or more servings of fruits and vegetables each day. At each meal, try to fill half of your plate with fruits and vegetables.  ? Up to 6-8 servings of whole grains each day.  ? Less than 6 oz of lean meat, poultry, or fish each day. A 3-oz serving of meat is about the same size as a deck of cards. One egg equals 1 oz.  ? 2 servings of low-fat dairy each day.  ? A serving of nuts, seeds, or beans 5 times each week.  ? Heart-healthy fats. Healthy fats called Omega-3 fatty acids are found in foods such as flaxseeds and coldwater fish, like sardines, salmon, and mackerel.   Limit how much you eat of the following:  ? Canned or prepackaged foods.  ? Food that is high in trans fat, such as fried foods.  ? Food that is high in saturated fat, such as fatty meat.  ? Sweets, desserts, sugary drinks, and other foods with added sugar.  ? Full-fat dairy products.   Do not salt foods before eating.   Try to eat at least 2 vegetarian meals each week.   Eat more home-cooked food and less restaurant, buffet, and fast food.     When eating at a restaurant, ask that your food be prepared with less salt or no salt, if possible.  What foods are recommended?  The items listed may not be a complete list. Talk with your dietitian about what dietary choices are best for you.  Grains  Whole-grain or whole-wheat bread. Whole-grain or whole-wheat pasta. Brown rice. Oatmeal. Quinoa. Bulgur. Whole-grain and low-sodium cereals. Pita bread. Low-fat, low-sodium crackers. Whole-wheat flour tortillas.  Vegetables  Fresh or frozen vegetables (raw, steamed, roasted, or grilled). Low-sodium or reduced-sodium tomato and vegetable juice. Low-sodium or reduced-sodium tomato sauce and tomato paste. Low-sodium or reduced-sodium canned vegetables.  Fruits  All fresh, dried, or frozen fruit. Canned fruit in natural juice (without  added sugar).  Meat and other protein foods  Skinless chicken or turkey. Ground chicken or turkey. Pork with fat trimmed off. Fish and seafood. Egg whites. Dried beans, peas, or lentils. Unsalted nuts, nut butters, and seeds. Unsalted canned beans. Lean cuts of beef with fat trimmed off. Low-sodium, lean deli meat.  Dairy  Low-fat (1%) or fat-free (skim) milk. Fat-free, low-fat, or reduced-fat cheeses. Nonfat, low-sodium ricotta or cottage cheese. Low-fat or nonfat yogurt. Low-fat, low-sodium cheese.  Fats and oils  Soft margarine without trans fats. Vegetable oil. Low-fat, reduced-fat, or light mayonnaise and salad dressings (reduced-sodium). Canola, safflower, olive, soybean, and sunflower oils. Avocado.  Seasoning and other foods  Herbs. Spices. Seasoning mixes without salt. Unsalted popcorn and pretzels. Fat-free sweets.  What foods are not recommended?  The items listed may not be a complete list. Talk with your dietitian about what dietary choices are best for you.  Grains  Baked goods made with fat, such as croissants, muffins, or some breads. Dry pasta or rice meal packs.  Vegetables  Creamed or fried vegetables. Vegetables in a cheese sauce. Regular canned vegetables (not low-sodium or reduced-sodium). Regular canned tomato sauce and paste (not low-sodium or reduced-sodium). Regular tomato and vegetable juice (not low-sodium or reduced-sodium). Pickles. Olives.  Fruits  Canned fruit in a light or heavy syrup. Fried fruit. Fruit in cream or butter sauce.  Meat and other protein foods  Fatty cuts of meat. Ribs. Fried meat. Bacon. Sausage. Bologna and other processed lunch meats. Salami. Fatback. Hotdogs. Bratwurst. Salted nuts and seeds. Canned beans with added salt. Canned or smoked fish. Whole eggs or egg yolks. Chicken or turkey with skin.  Dairy  Whole or 2% milk, cream, and half-and-half. Whole or full-fat cream cheese. Whole-fat or sweetened yogurt. Full-fat cheese. Nondairy creamers. Whipped toppings.  Processed cheese and cheese spreads.  Fats and oils  Butter. Stick margarine. Lard. Shortening. Ghee. Bacon fat. Tropical oils, such as coconut, palm kernel, or palm oil.  Seasoning and other foods  Salted popcorn and pretzels. Onion salt, garlic salt, seasoned salt, table salt, and sea salt. Worcestershire sauce. Tartar sauce. Barbecue sauce. Teriyaki sauce. Soy sauce, including reduced-sodium. Steak sauce. Canned and packaged gravies. Fish sauce. Oyster sauce. Cocktail sauce. Horseradish that you find on the shelf. Ketchup. Mustard. Meat flavorings and tenderizers. Bouillon cubes. Hot sauce and Tabasco sauce. Premade or packaged marinades. Premade or packaged taco seasonings. Relishes. Regular salad dressings.  Where to find more information:   National Heart, Lung, and Blood Institute: www.nhlbi.nih.gov   American Heart Association: www.heart.org  Summary   The DASH eating plan is a healthy eating plan that has been shown to reduce high blood pressure (hypertension). It may also reduce your risk for type 2 diabetes, heart disease, and stroke.   With the   DASH eating plan, you should limit salt (sodium) intake to 2,300 mg a day. If you have hypertension, you may need to reduce your sodium intake to 1,500 mg a day.   When on the DASH eating plan, aim to eat more fresh fruits and vegetables, whole grains, lean proteins, low-fat dairy, and heart-healthy fats.   Work with your health care provider or diet and nutrition specialist (dietitian) to adjust your eating plan to your individual calorie needs.  This information is not intended to replace advice given to you by your health care provider. Make sure you discuss any questions you have with your health care provider.  Document Released: 08/20/2011 Document Revised: 08/24/2016 Document Reviewed: 08/24/2016  Elsevier Interactive Patient Education  2019 [employer]

## 2018-11-04 NOTE — Assessment & Plan Note (Signed)
[id number] to be checked, minimize simple carbs. Increase exercise as tolerated. Continue current meds  

## 2018-11-04 NOTE — Assessment & Plan Note (Signed)
>>  ASSESSMENT AND PLAN FOR MIXED HYPERLIPIDEMIA WRITTEN ON 11/04/2018  8:51 PM BY LOWNE CHASE, YVONNE R, DO  Tolerating statin, encouraged heart healthy diet, avoid trans fats, minimize simple carbs and saturated fats. Increase exercise as tolerated

## 2018-11-04 NOTE — Progress Notes (Signed)
Patient ID: Rachel Black, female    DOB: 01-Nov-1950  Age: 68 y.o. MRN: [id number]    Subjective:  Subjective  HPI Rachel Black presents for f/u dm chol and bp.  HYPERTENSION   Blood pressure range-not checking   Chest pain- no      Dyspnea- no Lightheadedness- no   Edema- no  Other side effects - no   Medication compliance: good Low salt diet- yes    DIABETES    Blood Sugar ranges-high per pt  Polyuria- no New Visual problems- no  Hypoglycemic symptoms- no  Other side effects-no Medication compliance - good Last eye exam- due Foot exam- today   HYPERLIPIDEMIA  Medication compliance- good RUQ pain- no  Muscle aches- no Other side effects-no      Review of Systems  Constitutional: Negative for appetite change, diaphoresis, fatigue and unexpected weight change.  Eyes: Negative for pain, redness and visual disturbance.  Respiratory: Negative for cough, chest tightness, shortness of breath and wheezing.   Cardiovascular: Negative for chest pain, palpitations and leg swelling.  Endocrine: Negative for cold intolerance, heat intolerance, polydipsia, polyphagia and polyuria.  Genitourinary: Negative for difficulty urinating, dysuria and frequency.  Neurological: Negative for dizziness, light-headedness, numbness and headaches.    History Past Medical History:  Diagnosis Date  . Arthritis    PAIN AND OA BOTH KNEES AND SHOULDERS AND ELBOWS AND WRIST  . Blood transfusion without reported diagnosis 2018   after cholecystectomy  . Diabetes mellitus    ORAL MEDICATION  . GERD (gastroesophageal reflux disease)   . Gout    NO RECENT FLARE UPS  . H/O: rheumatic fever    AS A CHILD - NO KNOWN HEART MURMUR OR HEART PROBLEMS  . Heart attack ([place])   . Hyperlipidemia   . Hypertension   . Stroke [facility])     She has a past surgical history that includes Ventral hernia repair; Partial hysterectomy; Cesarean section; Esophagogastroduodenoscopy (egd) with propofol (N/A,  01/30/2013); Colonoscopy with propofol (N/A, 01/30/2013); Abdominal hysterectomy; Total knee arthroplasty (Right, 04/20/2014); Total knee arthroplasty (Left, 08/23/2015); ERCP (N/A, 12/08/2016); Cholecystectomy (N/A, 12/09/2016); TEE without cardioversion (N/A, 03/16/2017); LOOP RECORDER INSERTION (N/A, 03/18/2017); and LEFT HEART CATH AND CORONARY ANGIOGRAPHY (N/A, 10/18/2017).   Her family history includes Breast cancer in her mother; Crohn's disease in her son; Diabetes in her father and mother; Heart disease in her father; Hyperlipidemia in her father; Hypertension in her mother; Irritable bowel syndrome in her son; Stroke in her father.She reports that she has never smoked. She has never used smokeless tobacco. She reports that she does not drink alcohol or use drugs.  Current Outpatient Medications on File Prior to Visit  Medication Sig Dispense Refill  . ACCU-CHEK SOFTCLIX LANCETS lancets Use as instructed once a day.  DX Code: E11.9 100 each 1  . allopurinol (ZYLOPRIM) 300 MG tablet Take 1 tablet (300 mg total) by mouth 2 (two) times daily. 180 tablet 3  . aspirin 81 MG EC tablet Take 1 tablet (81 mg total) by mouth daily.    . Calcium Carb-Cholecalciferol (CALCIUM 600-D PO) Take 1 tablet by mouth 2 (two) times daily before a meal.    . Calcium Carbonate Antacid (TUMS PO) Take 2 tablets by mouth 4 (four) times daily as needed (acid reflux).    Marland Kitchen dexlansoprazole (DEXILANT) 60 MG capsule Take 1 capsule (60 mg total) by mouth 2 (two) times daily. TAKE 1 CAPSULE (60 MG TOTAL) twice a day 180 capsule 1  .  Emollient (UDDERLY SMOOTH) CREA Apply 1 application topically See admin instructions. Apply topically to skin folds as needed for rash/irritation    . ferrous sulfate 325 (65 FE) MG tablet TAKE 1 TABLET BY MOUTH TWICE A DAY 180 tablet 3  . glucose blood (ACCU-CHEK AVIVA PLUS) test strip Use as instructed once a day.  DX Code E11.9 100 each 1  . hydrocortisone (ANUSOL-HC) 2.5 % rectal cream Place 1 application  rectally at bedtime. Apply per rectum inside at bedtime for 7 days 30 g 0  . methocarbamol (ROBAXIN) 500 MG tablet TAKE 1 TABLET (500 MG TOTAL) BY MOUTH EVERY 8 (EIGHT) HOURS AS NEEDED FOR MUSCLE SPASMS. 60 tablet 0  . nitroGLYCERIN (NITROSTAT) 0.4 MG SL tablet Place 1 tablet (0.4 mg total) under the tongue every 5 (five) minutes x 3 doses as needed for chest pain. 25 tablet 2  . olmesartan (BENICAR) 20 MG tablet Take 1 tablet (20 mg total) by mouth daily. 90 tablet 1  . potassium chloride SA (K-DUR,KLOR-CON) 20 MEQ tablet Take 1 tablet (20 mEq total) by mouth daily. Take as instructed per provider 90 tablet 1  . senna-docusate (SENOKOT-S) 8.6-50 MG tablet Take 1 tablet by mouth at bedtime as needed for mild constipation. 30 tablet 1  . isosorbide mononitrate (IMDUR) 30 MG 24 hr tablet Take 1 tablet (30 mg total) by mouth daily. 90 tablet 3   No current facility-administered medications on file prior to visit.      Objective:  Objective  Physical Exam Vitals signs and nursing note reviewed.  Constitutional:      Appearance: She is well-developed.  HENT:     Head: Normocephalic and atraumatic.  Eyes:     Conjunctiva/sclera: Conjunctivae normal.  Neck:     Musculoskeletal: Normal range of motion and neck supple.     Thyroid: No thyromegaly.     Vascular: No carotid bruit or JVD.  Cardiovascular:     Rate and Rhythm: Normal rate and regular rhythm.     Heart sounds: Normal heart sounds. No murmur.  Pulmonary:     Effort: Pulmonary effort is normal. No respiratory distress.     Breath sounds: Normal breath sounds. No wheezing or rales.  Chest:     Chest wall: No tenderness.  Neurological:     Mental Status: She is alert and oriented to person, place, and time.    BP (!) 127/58 (BP Location: Left Wrist, Patient Position: Sitting, Cuff Size: Normal)   Pulse 68   Resp 14   Ht 5' 5"  (1.651 m)   Wt 259 lb (117.5 kg)   SpO2 98%   BMI 43.10 kg/m  Wt Readings from Last 3 Encounters:    11/04/18 259 lb (117.5 kg)  09/27/18 259 lb 3.2 oz (117.6 kg)  08/15/18 262 lb 9.6 oz (119.1 kg)     Lab Results  Component Value Date   WBC 6.8 12/21/2017   HGB 12.5 12/21/2017   HCT 38.0 12/21/2017   PLT 134.0 (L) 12/21/2017   GLUCOSE 146 (H) 12/21/2017   CHOL 127 12/21/2017   TRIG 299.0 (H) 12/21/2017   HDL 32.50 (L) 12/21/2017   LDLDIRECT 50.0 12/21/2017   LDLCALC 54 10/17/2017   ALT 21 12/21/2017   AST 24 12/21/2017   NA 141 12/21/2017   K 5.1 12/21/2017   CL 105 12/21/2017   CREATININE 1.42 (H) 12/21/2017   BUN 22 12/21/2017   CO2 28 12/21/2017   TSH 2.863 10/16/2017   INR 1.01  10/18/2017   HGBA1C 6.0 12/21/2017   MICROALBUR 1.6 12/21/2017    US Breast Ltd Uni Left Inc Axilla  Result Date: 01/17/2018 CLINICAL DATA:  The patient returns after baseline screening for evaluation of possible LEFT breast masses. Patient's mother was diagnosed with breast cancer in her 53s. EXAM: DIGITAL DIAGNOSTIC LEFT MAMMOGRAM WITH CAD AND TOMO ULTRASOUND LEFT BREAST COMPARISON:  01/04/2018 ACR Breast Density Category a: The breast tissue is almost entirely fatty. FINDINGS: Additional views are performed, confirming presence of circumscribed bilobed mass in the LATERAL portion of the LEFT breast. There is associated lucent center and mass likely represents a benign intramammary lymph node, 1 centimeter in diameter. An oval circumscribed mass is identified in the retroareolar region of the LEFT breast. Scattered sub centimeter circumscribed masses are also identified in the retroareolar region. Mammographic images were processed with CAD. On physical exam, I palpate no abnormality in the retroareolar region of the LEFT breast or the LATERAL portion of the LEFT breast. Targeted ultrasound is performed, showing a focally ectatic duct versus cyst in the 6 o'clock location of the LEFT breast 1 centimeter from the nipple which measures 0.9 x 0.4 x 1.5 centimeters. No intraductal mass identified. In the  2 o'clock location 3 centimeters from the nipple, a small cyst is 0.6 x 0.2 x 0.6 centimeters. No suspicious masses or areas of distortion identified sonographically. IMPRESSION: 1. Probable intramammary lymph node in the LATERAL portion of the LEFT breast. 2. Small focally ectatic duct versus cyst in the retroareolar region of the LEFT breast. 3. Benign cyst in the o'clock location of the LEFT breast. 4.  No mammographic or ultrasound evidence for malignancy. RECOMMENDATION: Screening mammogram in one year.(Code:SM-B-01Y) I have discussed the findings and recommendations with the patient. Results were also provided in writing at the conclusion of the visit. If applicable, a reminder letter will be sent to the patient regarding the next appointment. BI-RADS CATEGORY  2: Benign. Electronically Signed   By: Nolon Nations M.D.   On: 01/14/2018 15:46   Mm Diag Breast Tomo Uni Left  Result Date: 01/14/2018 CLINICAL DATA:  The patient returns after baseline screening for evaluation of possible LEFT breast masses. Patient's mother was diagnosed with breast cancer in her 88s. EXAM: DIGITAL DIAGNOSTIC LEFT MAMMOGRAM WITH CAD AND TOMO ULTRASOUND LEFT BREAST COMPARISON:  01/04/2018 ACR Breast Density Category a: The breast tissue is almost entirely fatty. FINDINGS: Additional views are performed, confirming presence of circumscribed bilobed mass in the LATERAL portion of the LEFT breast. There is associated lucent center and mass likely represents a benign intramammary lymph node, 1 centimeter in diameter. An oval circumscribed mass is identified in the retroareolar region of the LEFT breast. Scattered sub centimeter circumscribed masses are also identified in the retroareolar region. Mammographic images were processed with CAD. On physical exam, I palpate no abnormality in the retroareolar region of the LEFT breast or the LATERAL portion of the LEFT breast. Targeted ultrasound is performed, showing a focally ectatic  duct versus cyst in the 6 o'clock location of the LEFT breast 1 centimeter from the nipple which measures 0.9 x 0.4 x 1.5 centimeters. No intraductal mass identified. In the 2 o'clock location 3 centimeters from the nipple, a small cyst is 0.6 x 0.2 x 0.6 centimeters. No suspicious masses or areas of distortion identified sonographically. IMPRESSION: 1. Probable intramammary lymph node in the LATERAL portion of the LEFT breast. 2. Small focally ectatic duct versus cyst in the retroareolar region of the LEFT  breast. 3. Benign cyst in the o'clock location of the LEFT breast. 4.  No mammographic or ultrasound evidence for malignancy. RECOMMENDATION: Screening mammogram in one year.(Code:SM-B-01Y) I have discussed the findings and recommendations with the patient. Results were also provided in writing at the conclusion of the visit. If applicable, a reminder letter will be sent to the patient regarding the next appointment. BI-RADS CATEGORY  2: Benign. Electronically Signed   By: Nolon Nations M.D.   On: 01/14/2018 15:46     Assessment & Plan:  Plan  I have changed Enid Derry K. Handa's fenofibrate and rosuvastatin. I am also having her start on olmesartan. Additionally, I am having her maintain her senna-docusate, glucose blood, ACCU-CHEK SOFTCLIX LANCETS, Calcium Carb-Cholecalciferol (CALCIUM 600-D PO), Calcium Carbonate Antacid (TUMS PO), UDDERLY SMOOTH, aspirin, nitroGLYCERIN, potassium chloride SA, isosorbide mononitrate, allopurinol, methocarbamol, ferrous sulfate, dexlansoprazole, olmesartan, hydrocortisone, and SitaGLIPtin-MetFORMIN HCl.  Meds ordered this encounter  Medications  . olmesartan (BENICAR) 5 MG tablet    Sig: Take 2 tablets (10 mg total) by mouth daily.    Dispense:  60 tablet    Refill:  2  . fenofibrate 160 MG tablet    Sig: Take 1 tablet (160 mg total) by mouth daily.    Dispense:  90 tablet    Refill:  1  . rosuvastatin (CRESTOR) 40 MG tablet    Sig: Take 1 tablet (40 mg total)  by mouth daily.    Dispense:  90 tablet    Refill:  1  . SitaGLIPtin-MetFORMIN HCl (JANUMET XR) [phone] MG TB24    Sig: Take 1 tablet by mouth daily.    Dispense:  90 tablet    Refill:  1    Problem List Items Addressed This Visit      Unprioritized   Diabetes mellitus ([place]) (Chronic)    hgba1c to be checked , minimize simple carbs. Increase exercise as tolerated. Continue current meds       Relevant Medications   olmesartan (BENICAR) 5 MG tablet   rosuvastatin (CRESTOR) 40 MG tablet   SitaGLIPtin-MetFORMIN HCl (JANUMET XR) [phone] MG TB24   Essential hypertension - Primary (Chronic)    Well controlled, no changes to meds. Encouraged heart healthy diet such as the DASH diet and exercise as tolerated.       Relevant Medications   olmesartan (BENICAR) 5 MG tablet   fenofibrate 160 MG tablet   rosuvastatin (CRESTOR) 40 MG tablet   Other Relevant Orders   Lipid panel   Hemoglobin A1c   Comprehensive metabolic panel   GOUT   Relevant Orders   Uric acid   Mixed hyperlipidemia    Tolerating statin, encouraged heart healthy diet, avoid trans fats, minimize simple carbs and saturated fats. Increase exercise as tolerated      Relevant Medications   olmesartan (BENICAR) 5 MG tablet   fenofibrate 160 MG tablet   rosuvastatin (CRESTOR) 40 MG tablet    Other Visit Diagnoses    Hyperlipidemia, unspecified hyperlipidemia type       Relevant Medications   olmesartan (BENICAR) 5 MG tablet   fenofibrate 160 MG tablet   rosuvastatin (CRESTOR) 40 MG tablet   Other Relevant Orders   Lipid panel   Hemoglobin A1c   Comprehensive metabolic panel   DM (diabetes mellitus) type II uncontrolled, periph vascular disorder (HCC)       Relevant Medications   olmesartan (BENICAR) 5 MG tablet   fenofibrate 160 MG tablet   rosuvastatin (CRESTOR) 40 MG tablet  SitaGLIPtin-MetFORMIN HCl (JANUMET XR) [phone] MG TB24   Other Relevant Orders   Lipid panel   Hemoglobin A1c   Comprehensive  metabolic panel   Hyperlipidemia LDL goal <70       Relevant Medications   olmesartan (BENICAR) 5 MG tablet   fenofibrate 160 MG tablet   rosuvastatin (CRESTOR) 40 MG tablet   Hyperlipidemia associated with type 2 diabetes mellitus (HCC)       Relevant Medications   olmesartan (BENICAR) 5 MG tablet   fenofibrate 160 MG tablet   rosuvastatin (CRESTOR) 40 MG tablet   SitaGLIPtin-MetFORMIN HCl (JANUMET XR) [phone] MG TB24      Follow-up: Return in about 6 months (around 05/05/2019) for annual exam, fasting.  Ann Held, DO

## 2018-11-04 NOTE — Assessment & Plan Note (Signed)
>>  ASSESSMENT AND PLAN FOR ESSENTIAL HYPERTENSION WRITTEN ON 11/04/2018  8:51 PM BY LOWNE CHASE, YVONNE R, DO  Well controlled, no changes to meds. Encouraged heart healthy diet such as the DASH diet and exercise as tolerated.

## 2018-11-04 NOTE — Assessment & Plan Note (Signed)
Tolerating statin, encouraged heart healthy diet, avoid trans fats, minimize simple carbs and saturated fats. Increase exercise as tolerated 

## 2018-11-04 NOTE — Assessment & Plan Note (Signed)
Well controlled, no changes to meds. Encouraged heart healthy diet such as the DASH diet and exercise as tolerated.  °

## 2018-11-07 ENCOUNTER — Telehealth: Payer: Self-pay | Admitting: Family Medicine

## 2018-11-07 DIAGNOSIS — I10 Essential (primary) hypertension: Secondary | ICD-10-CM

## 2018-11-07 MED ORDER — OLMESARTAN MEDOXOMIL 5 MG PO TABS: 10.0000 mg | ORAL_TABLET | Freq: Every day | ORAL | 2 refills | Status: DC

## 2018-11-07 NOTE — Telephone Encounter (Signed)
Copied from [place] [id number]. Topic: Quick Communication - Rx Refill/Question >> Nov 07, 2018 10:26 AM Scherrie Gerlach wrote: Medication: olmesartan (BENICAR) 5 MG tablet  Spoke with the pharmacy after pt called because this Rx was not at the pharmacy after her visit 11/04/18. Pharmacy confirmed they DID NOT receive and requesting call a verbal to them so pt can pick up today please They did received all the other refills sent that day. [id number] [phone](Phone) [phone](Fax)

## 2018-11-14 ENCOUNTER — Ambulatory Visit (INDEPENDENT_AMBULATORY_CARE_PROVIDER_SITE_OTHER): Payer: BLUE CROSS/BLUE SHIELD | Admitting: *Deleted

## 2018-11-14 DIAGNOSIS — I639 Cerebral infarction, unspecified: Secondary | ICD-10-CM | POA: Diagnosis not present

## 2018-11-15 LAB — CUP PACEART REMOTE DEVICE CHECK
Implantable Pulse Generator Implant Date: 20180705
MDC IDC SESS DTM: 20200302233556

## 2018-11-18 ENCOUNTER — Ambulatory Visit (INDEPENDENT_AMBULATORY_CARE_PROVIDER_SITE_OTHER): Payer: BLUE CROSS/BLUE SHIELD | Admitting: Family Medicine

## 2018-11-18 VITALS — BP 111/70 | HR 71

## 2018-11-18 DIAGNOSIS — I10 Essential (primary) hypertension: Secondary | ICD-10-CM | POA: Diagnosis not present

## 2018-11-18 NOTE — Progress Notes (Signed)
Patient her for blood pressure check.  Last blood pressure was 127/58 at last visit on 11/04/18.  Patient is on Benicar 5mg  2 tablets a day.  Has taken medication today.  Blood pressure today is 111/70  Pulse 71.  Per Dr. Etter Sjogren stay on same dose and recheck in 3 months.    Patient schedule for 02/20/19.

## 2018-11-21 NOTE — Progress Notes (Signed)
Carelink Summary Report / Loop Recorder 

## 2018-12-02 ENCOUNTER — Other Ambulatory Visit: Payer: Self-pay

## 2018-12-02 MED ORDER — ISOSORBIDE MONONITRATE ER 30 MG PO TB24: 30.0000 mg | ORAL_TABLET | Freq: Every day | ORAL | 3 refills | Status: DC

## 2018-12-15 ENCOUNTER — Ambulatory Visit (INDEPENDENT_AMBULATORY_CARE_PROVIDER_SITE_OTHER): Payer: BLUE CROSS/BLUE SHIELD | Admitting: *Deleted

## 2018-12-15 ENCOUNTER — Other Ambulatory Visit: Payer: Self-pay

## 2018-12-15 DIAGNOSIS — I639 Cerebral infarction, unspecified: Secondary | ICD-10-CM | POA: Diagnosis not present

## 2018-12-19 ENCOUNTER — Ambulatory Visit: Payer: BLUE CROSS/BLUE SHIELD | Admitting: *Deleted

## 2018-12-19 ENCOUNTER — Telehealth: Payer: Self-pay | Admitting: Internal Medicine

## 2018-12-19 DIAGNOSIS — I639 Cerebral infarction, unspecified: Secondary | ICD-10-CM

## 2018-12-19 LAB — CUP PACEART REMOTE DEVICE CHECK
Date Time Interrogation Session: 20200405003828
Implantable Pulse Generator Implant Date: 20180705

## 2018-12-19 NOTE — Telephone Encounter (Signed)
Pt is experiencing abd pain since Saturday and would like to schedule an appt.  Please advise.

## 2018-12-20 ENCOUNTER — Telehealth: Payer: Self-pay | Admitting: Physician Assistant

## 2018-12-20 ENCOUNTER — Other Ambulatory Visit: Payer: Self-pay

## 2018-12-20 ENCOUNTER — Encounter: Payer: Self-pay | Admitting: Physician Assistant

## 2018-12-20 LAB — CUP PACEART REMOTE DEVICE CHECK
Date Time Interrogation Session: 20200405003828
Implantable Pulse Generator Implant Date: 20180705

## 2018-12-20 NOTE — Progress Notes (Addendum)
Virtual Visit via Telephone Note   This visit type was conducted due to national recommendations for restrictions regarding the COVID-19 Pandemic (e.g. social distancing) in an effort to limit this patient's exposure and mitigate transmission in our community.  Due to her co-morbid illnesses, this patient is at least at moderate risk for complications without adequate follow up.  This format is felt to be most appropriate for this patient at this time.  The patient did not have access to video technology/had technical difficulties with video requiring transitioning to audio format only (telephone).  All issues noted in this document were discussed and addressed.  No physical exam could be performed with this format.  Please refer to the patient's chart for her  consent to telehealth for [facility].  Evaluation Performed:  Follow-up visit  This visit type was conducted due to national recommendations for restrictions regarding the COVID-19 Pandemic (e.g. social distancing).  This format is felt to be most appropriate for this patient at this time.  All issues noted in this document were discussed and addressed.  No physical exam was performed (except for noted visual exam findings with Video Visits).  Please refer to the patient's chart (MyChart message for video visits and phone note for telephone visits) for the patient's consent to telehealth for [facility].  Date:  12/21/2018   ID:  Rachel Black, DOB [birth year], MRN [id number]  Patient Location:  Home  Provider location:   Office   PCP:  Carollee Herter, Alferd Apa, DO  Cardiologist:  Minus Breeding, MD 02/23/2018 Electrophysiologist:  None   Chief Complaint:  Follow up  History of Present Illness:    Rachel Black is a 68 y.o. female who presents via audio/video conferencing for a telehealth visit today.   She is a 68 y.o. female with a hx of NSTEMI 10/2017 w/ med mgt for 100% OM2, cryptogenic CVA 03/2017 s/p ILR w/  +PFO on TEE, OA, DM, HTN, HLD, GERD, gout. EF nl echo 10/2017  02/18/2018 office visit, BP at target, no Afib on ILR, no cardiac testing indicated. F/u 1 yr.  The patient does not have symptoms concerning for COVID-19 infection (fever, chills, cough, or new shortness of breath).   The worst thing she has had recently is stomach pain. She did not have diarrhea or vomiting. She called Dr Henrene Pastor, but did not hear back. The sx resolved without incident.   She stays in most of the time. Has not been out of the house in 2-3 weeks. She does not walk that well because of her knees. However, she is willing to try to go out and walk more. [place], easy to get out w/out violating distance rules.   The pollen has been bothering her, she has a cough, but that started before the [place]. No fevers or chills.   Occasional pain in a breast, either side. She wonders if that is from wearing a bra. It is not exertional and resolves spontaneously.   No exertional chest pain.   Occasionally feels her heart rate speed up. May be emotional. Youngest son was recently hospitalized. No presyncope or syncope.   No LE edema, no orthopnea or PND.    Prior CV studies:   The following studies were reviewed today:  ILR: 12/18/2018 Preliminary Carelink summary report received. Battery status OK. Normal device function. No new symptom episodes, tachy episodes, brady, or pause episodes. No new AF episodes. Monthly summary  reports and ROV/PRN  CARDIAC CATH: 10/18/2017  Lat 2nd Mrg lesion is 100% stenosed.  LV end diastolic pressure is normal.   1. Single vessel occlusive CAD with 100% occlusion of side branch of the second OM 2. Otherwise normal coronary anatomy 3. Normal LVEDP  ECHO: 10/17/2017 - Left ventricle: The cavity size was normal. There was mild   concentric hypertrophy. Systolic function was normal. The   estimated ejection fraction was in the range of 55% to 60%. Wall   motion was  normal; there were no regional wall motion   abnormalities. Doppler parameters are consistent with abnormal   left ventricular relaxation (grade 1 diastolic dysfunction). - Aortic valve: Transvalvular velocity was within the normal range.   There was no stenosis. There was no regurgitation. - Mitral valve: Transvalvular velocity was within the normal range.   There was no evidence for stenosis. There was no regurgitation. - Right ventricle: The cavity size was normal. Wall thickness was   normal. Systolic function was normal. - Atrial septum: No defect or patent foramen ovale was identified   by color flow Doppler. - Tricuspid valve: There was mild regurgitation. - Pulmonary arteries: Systolic pressure was within the normal   range. PA peak pressure: 21 mm Hg (S).   Past Medical History:  Diagnosis Date  . Arthritis    PAIN AND OA BOTH KNEES AND SHOULDERS AND ELBOWS AND WRIST  . Blood transfusion without reported diagnosis 2018   after cholecystectomy  . Diabetes mellitus    ORAL MEDICATION  . GERD (gastroesophageal reflux disease)   . Gout    NO RECENT FLARE UPS  . H/O: rheumatic fever    AS A CHILD - NO KNOWN HEART MURMUR OR HEART PROBLEMS  . Heart attack ([place])   . Hyperlipidemia   . Hypertension   . Stroke [facility]) 03/2017   Past Surgical History:  Procedure Laterality Date  . ABDOMINAL HYSTERECTOMY    . CESAREAN SECTION    . CHOLECYSTECTOMY N/A 12/09/2016   Procedure: LAPAROSCOPIC CHOLECYSTECTOMY WITH INTRAOPERATIVE CHOLANGIOGRAM;  Surgeon: Stark Klein, MD;  Location: [facility] ORS;  Service: General;  Laterality: N/A;  . COLONOSCOPY WITH PROPOFOL N/A 01/30/2013   Procedure: COLONOSCOPY WITH PROPOFOL;  Surgeon: Irene Shipper, MD;  Location: [facility] ENDOSCOPY;  Service: Endoscopy;  Laterality: N/A;  . ERCP N/A 12/08/2016   Procedure: ENDOSCOPIC RETROGRADE CHOLANGIOPANCREATOGRAPHY (ERCP);  Surgeon: Ladene Artist, MD;  Location: [facility];  Service: Endoscopy;  Laterality: N/A;  .  ESOPHAGOGASTRODUODENOSCOPY (EGD) WITH PROPOFOL N/A 01/30/2013   Procedure: ESOPHAGOGASTRODUODENOSCOPY (EGD) WITH PROPOFOL;  Surgeon: Irene Shipper, MD;  Location: [facility];  Service: Endoscopy;  Laterality: N/A;  . LEFT HEART CATH AND CORONARY ANGIOGRAPHY N/A 10/18/2017   Procedure: LEFT HEART CATH AND CORONARY ANGIOGRAPHY;  Surgeon: Martinique, Peter M, MD;  Location: [facility];  Service: Cardiovascular;  Laterality: N/A;  . LOOP RECORDER INSERTION N/A 03/18/2017   Procedure: Loop Recorder Insertion;  Surgeon: Constance Haw, MD;  Location: [facility];  Service: Cardiovascular;  Laterality: N/A;  . PARTIAL HYSTERECTOMY    . TEE WITHOUT CARDIOVERSION N/A 03/16/2017   Procedure: TRANSESOPHAGEAL ECHOCARDIOGRAM (TEE);  Surgeon: Acie Fredrickson Wonda Cheng, MD;  Location: [facility] ENDOSCOPY;  Service: Cardiovascular;  Laterality: N/A;  . TOTAL KNEE ARTHROPLASTY Right 04/20/2014   Procedure: RIGHT TOTAL KNEE ARTHROPLASTY CONVERTED TO RIGHT KNEE REIMPLANTATION;  Surgeon: Mcarthur Rossetti, MD;  Location: [facility];  Service: Orthopedics;  Laterality: Right;  . TOTAL KNEE ARTHROPLASTY Left  08/23/2015   Procedure: LEFT TOTAL KNEE ARTHROPLASTY;  Surgeon: Mcarthur Rossetti, MD;  Location: [facility];  Service: Orthopedics;  Laterality: Left;  Marland Kitchen VENTRAL HERNIA REPAIR     x2     Current Meds  Medication Sig  . ACCU-CHEK SOFTCLIX LANCETS lancets Use as instructed once a day.  DX Code: E11.9  . allopurinol (ZYLOPRIM) 300 MG tablet Take 1 tablet (300 mg total) by mouth 2 (two) times daily.  Marland Kitchen aspirin 81 MG EC tablet Take 1 tablet (81 mg total) by mouth daily.  . Calcium Carb-Cholecalciferol (CALCIUM 600-D PO) Take 1 tablet by mouth 2 (two) times daily before a meal.  . dexlansoprazole (DEXILANT) 60 MG capsule Take 1 capsule (60 mg total) by mouth 2 (two) times daily. TAKE 1 CAPSULE (60 MG TOTAL) twice a day  . Emollient (UDDERLY SMOOTH) CREA Apply 1 application topically See admin instructions. Apply  topically to skin folds as needed for rash/irritation  . Famotidine (PEPCID PO) Take by mouth daily as needed.  . fenofibrate 160 MG tablet Take 1 tablet (160 mg total) by mouth daily.  . ferrous sulfate 325 (65 FE) MG tablet TAKE 1 TABLET BY MOUTH TWICE A DAY  . glucose blood (ACCU-CHEK AVIVA PLUS) test strip Use as instructed once a day.  DX Code E11.9  . hydrocortisone (ANUSOL-HC) 2.5 % rectal cream Place 1 application rectally at bedtime. Apply per rectum inside at bedtime for 7 days  . isosorbide mononitrate (IMDUR) 30 MG 24 hr tablet Take 1 tablet (30 mg total) by mouth daily.  . nitroGLYCERIN (NITROSTAT) 0.4 MG SL tablet Place 1 tablet (0.4 mg total) under the tongue every 5 (five) minutes x 3 doses as needed for chest pain.  Marland Kitchen olmesartan (BENICAR) 5 MG tablet Take 2 tablets (10 mg total) by mouth daily.  . rosuvastatin (CRESTOR) 40 MG tablet Take 1 tablet (40 mg total) by mouth daily.  Marland Kitchen senna-docusate (SENOKOT-S) 8.6-50 MG tablet Take 1 tablet by mouth at bedtime as needed for mild constipation.  . SitaGLIPtin-MetFORMIN HCl (JANUMET XR) [phone] MG TB24 Take 1 tablet by mouth daily.     Allergies:   Other and Penicillins   Social History   Tobacco Use  . Smoking status: Never Smoker  . Smokeless tobacco: Never Used  Substance Use Topics  . Alcohol use: No  . Drug use: No     Family Hx: The patient's family history includes Breast cancer in her mother; Crohn's disease in her son; Diabetes in her father and mother; Heart disease in her father; Hyperlipidemia in her father; Hypertension in her mother; Irritable bowel syndrome in her son; Stroke in her father.  ROS:   Please see the history of present illness.    All other systems reviewed and are negative.   Labs/Other Tests and Data Reviewed:    Recent Labs: No results found for requested labs within last 8760 hours.   Recent Lipid Panel Lab Results  Component Value Date/Time   CHOL 127 12/21/2017 10:44 AM   TRIG  299.0 (H) 12/21/2017 10:44 AM   HDL 32.50 (L) 12/21/2017 10:44 AM   CHOLHDL 4 12/21/2017 10:44 AM   LDLCALC 54 10/17/2017 02:56 AM   LDLDIRECT 50.0 12/21/2017 10:44 AM    Wt Readings from Last 3 Encounters:  12/21/18 253 lb 8 oz (115 kg)  11/04/18 259 lb (117.5 kg)  09/27/18 259 lb 3.2 oz (117.6 kg)     Objective:    Vital Signs:  BP 123/88  Pulse 84   Ht 5' 5"  (1.651 m)   Wt 253 lb 8 oz (115 kg)   BMI 42.18 kg/m    68 y.o. female in no obvious distress on the phone   ASSESSMENT & PLAN:    1.  CAD, no ischemic sx -Continue aspirin, Imdur, sublingual nitroglycerin, high-dose Crestor. -No ongoing ischemic symptoms, no need for additional testing at this time.  2. HTN -She is compliant with medications and blood pressure is well controlled today. -No medication changes.  3.  Dyslipidemia, goal LDL less than 70 -See lipid profile above.  Her LDL is at goal, but HDL is low and triglycerides are elevated. - Her blood sugar this morning was 110, but she does not check it every day.   -However, she states she does not eat a lot of simple carbohydrates although she admits she did have some cookies the other day. -Continue fenofibrate and Crestor, dietary compliance encouraged   COVID-19 Education: The signs and symptoms of COVID-19 were discussed with the patient and how to seek care for testing (follow up with PCP or arrange E-visit).  The importance of social distancing was discussed today.  Patient Risk:   After full review of this patient's clinical status, I feel that they are at least moderate risk at this time.  Time:   Today, I have spent 22 minutes with the patient with telehealth technology discussing her cardiac issues and screening for cardiac symptoms.    Medication Adjustments/Labs and Tests Ordered: Current medicines are reviewed at length with the patient today.  Concerns regarding medicines are outlined above.  Tests Ordered: Orders Placed This  Encounter  Procedures  . Lipid panel   Medication Changes: No orders of the defined types were placed in this encounter.   Disposition:  Follow up In 6 months  Will patient expressed her preference to follow-up with Dr. Percival Spanish twice a year instead of once a year  Signed, Rosaria Ferries, PA-C  12/21/2018 11:08 AM    [place]

## 2018-12-20 NOTE — Telephone Encounter (Signed)
No answer and no voice mail.

## 2018-12-20 NOTE — Telephone Encounter (Signed)
Virtual Visit Pre-Appointment Phone Call  Steps For Call:  1. Confirm consent - "In the setting of the current Covid19 crisis, you are scheduled for a (phone or video) visit with your provider on (date) at (time).  Just as we do with many in-office visits, in order for you to participate in this visit, we must obtain consent.  If you'd like, I can send this to your mychart (if signed up) or email for you to review.  Otherwise, I can obtain your verbal consent now.  All virtual visits are billed to your insurance company just like a normal visit would be.  By agreeing to a virtual visit, we'd like you to understand that the technology does not allow for your provider to perform an examination, and thus may limit your provider's ability to fully assess your condition.  Finally, though the technology is pretty good, we cannot assure that it will always work on either your or our end, and in the setting of a video visit, we may have to convert it to a phone-only visit.  In either situation, we cannot ensure that we have a secure connection.  Are you willing to proceed?"  2. Give patient instructions for WebEx download to smartphone as below if video visit  3. Advise patient to be prepared with any vital sign or heart rhythm information, their current medicines, and a piece of paper and pen handy for any instructions they may receive the day of their visit  4. Inform patient they will receive a phone call 15 minutes prior to their appointment time (may be from unknown caller ID) so they should be prepared to answer  5. Confirm that appointment type is correct in Epic appointment notes (video vs telephone)    TELEPHONE CALL NOTE  [name] has been deemed a candidate for a follow-up tele-health visit to limit community exposure during the Covid-19 pandemic. I spoke with the patient via phone to ensure availability of phone/video source, confirm preferred email & phone number, and discuss  instructions and expectations.  I reminded [name] to be prepared with any vital sign and/or heart rhythm information that could potentially be obtained via home monitoring, at the time of her visit. I reminded [name] to expect a phone call at the time of her visit if her visit.  Did the patient verbally acknowledge consent to treatment? Yes  Therisa Doyne 12/20/2018 2:38 PM   CONSENT FOR TELE-HEALTH VISIT - PLEASE REVIEW  I hereby voluntarily request, consent and authorize [facility] and its employed or contracted physicians, physician assistants, nurse practitioners or other licensed health care professionals (the Practitioner), to provide me with telemedicine health care services (the Services") as deemed necessary by the treating Practitioner. I acknowledge and consent to receive the Services by the Practitioner via telemedicine. I understand that the telemedicine visit will involve communicating with the Practitioner through live audiovisual communication technology and the disclosure of certain medical information by electronic transmission. I acknowledge that I have been given the opportunity to request an in-person assessment or other available alternative prior to the telemedicine visit and am voluntarily participating in the telemedicine visit.  I understand that I have the right to withhold or withdraw my consent to the use of telemedicine in the course of my care at any time, without affecting my right to future care or treatment, and that the Practitioner or I may terminate the telemedicine visit at any time. I understand that  I have the right to inspect all information obtained and/or recorded in the course of the telemedicine visit and may receive copies of available information for a reasonable fee.  I understand that some of the potential risks of receiving the Services via telemedicine include:   Delay or interruption in medical evaluation due to technological  equipment failure or disruption;  Information transmitted may not be sufficient (e.g. poor resolution of images) to allow for appropriate medical decision making by the Practitioner; and/or   In rare instances, security protocols could fail, causing a breach of personal health information.  Furthermore, I acknowledge that it is my responsibility to provide information about my medical history, conditions and care that is complete and accurate to the best of my ability. I acknowledge that Practitioner's advice, recommendations, and/or decision may be based on factors not within their control, such as incomplete or inaccurate data provided by me or distortions of diagnostic images or specimens that may result from electronic transmissions. I understand that the practice of medicine is not an exact science and that Practitioner makes no warranties or guarantees regarding treatment outcomes. I acknowledge that I will receive a copy of this consent concurrently upon execution via email to the email address I last provided but may also request a printed copy by calling the office of [place].    I understand that my insurance will be billed for this visit.   I have read or had this consent read to me.  I understand the contents of this consent, which adequately explains the benefits and risks of the Services being provided via telemedicine.   I have been provided ample opportunity to ask questions regarding this consent and the Services and have had my questions answered to my satisfaction.  I give my informed consent for the services to be provided through the use of telemedicine in my medical care  By participating in this telemedicine visit I agree to the above.

## 2018-12-20 NOTE — Telephone Encounter (Signed)
Pt does have working BP cuff and weight scale at home. Medications reviewed and updated. Pharmacy correct and allergies reviewed.

## 2018-12-21 ENCOUNTER — Telehealth (INDEPENDENT_AMBULATORY_CARE_PROVIDER_SITE_OTHER): Payer: BLUE CROSS/BLUE SHIELD | Admitting: Physician Assistant

## 2018-12-21 ENCOUNTER — Encounter: Payer: Self-pay | Admitting: Physician Assistant

## 2018-12-21 VITALS — BP 123/88 | HR 84 | Ht 65.0 in | Wt 253.5 lb

## 2018-12-21 DIAGNOSIS — I10 Essential (primary) hypertension: Secondary | ICD-10-CM

## 2018-12-21 DIAGNOSIS — I2510 Atherosclerotic heart disease of native coronary artery without angina pectoris: Secondary | ICD-10-CM | POA: Diagnosis not present

## 2018-12-21 DIAGNOSIS — E785 Hyperlipidemia, unspecified: Secondary | ICD-10-CM

## 2018-12-21 NOTE — Patient Instructions (Signed)
Medication Instructions:  Your physician recommends that you continue on your current medications as directed. Please refer to the Current Medication list given to you today.  If you need a refill on your cardiac medications before your next appointment, please call your pharmacy.   Lab work: Please have blood work done to check your cholesterol in 6 months. You can either have this done at our office (no appointment needed) or with your PCP. (FASTING Lipid)  Do not eat or drink after midnight the night before having this blood work done.  If you have labs (blood work) drawn today and your tests are completely normal, you will receive your results only by: Marland Kitchen MyChart Message (if you have MyChart) OR . A paper copy in the mail If you have any lab test that is abnormal or we need to change your treatment, we will call you to review the results.  Follow-Up: At [facility], you and your health needs are our priority.  As part of our continuing mission to provide you with exceptional heart care, we have created designated Provider Care Teams.  These Care Teams include your primary Cardiologist (physician) and Advanced Practice Providers (APPs -  Physician Assistants and Nurse Practitioners) who all work together to provide you with the care you need, when you need it. You will need a follow up appointment in 6 months.  Please call our office 2 months in advance to schedule this appointment.  You may see Minus Breeding, MD or one of the following Advanced Practice Providers on your designated Care Team:   Rosaria Ferries, PA-C . Jory Sims, DNP, ANP  Any Other Special Instructions Will Be Listed Below (If Applicable). None

## 2018-12-22 NOTE — Telephone Encounter (Signed)
Patient reports that her pain has resolved and no longer needs an appt. She thanked me for the call

## 2018-12-23 NOTE — Progress Notes (Signed)
Carelink Summary Report / Loop Recorder 

## 2018-12-28 NOTE — Progress Notes (Signed)
Carelink Summary Report / Loop Recorder 

## 2018-12-30 ENCOUNTER — Telehealth: Payer: Self-pay | Admitting: Cardiology

## 2018-12-30 NOTE — Telephone Encounter (Signed)
Pt called to report that she has had nausea all morning. She says her chest hurt for just a few minutes when she felt like she was going to throw up but she did not and the pain went away.. she says her left arm feels"strange" not numb and does not hurt... she denies sob and dizziness.. her BP 147/87 and HR66... she does not have any stroke symptoms per her husband... he says she looks good to him.... I advised her if the pain returns to take a nitro and explained to her how to take it... to rest and call EMS if anything worsens... Pt agrees and verbalized understanding.. will forward to Dr. Percival Spanish.

## 2018-12-30 NOTE — Telephone Encounter (Signed)
Agree 

## 2018-12-30 NOTE — Telephone Encounter (Signed)
Husband of Pt called because Pt is experiencing a rapid heart rate   Patient c/o Palpitations:  High priority if patient c/o lightheadedness, shortness of breath, or chest pain  1) How long have you had palpitations/irregular HR/ Afib? Are you having the symptoms now? Today about 7;30am  2) Are you currently experiencing lightheadedness, SOB or CP? A little lightheaded,  No sob, chest hurts a little.   3) Do you have a history of afib (atrial fibrillation) or irregular heart rhythm? no  4) Have you checked your BP or HR? (document readings if available): 147/87 HR 66  5) Are you experiencing any other symptoms? Nausea w/o vomiting, had a hard time taking a deep breath. Left hand feels stranger than normal.

## 2019-01-10 ENCOUNTER — Other Ambulatory Visit: Payer: Self-pay | Admitting: Family Medicine

## 2019-01-10 DIAGNOSIS — M1000 Idiopathic gout, unspecified site: Secondary | ICD-10-CM

## 2019-01-19 ENCOUNTER — Ambulatory Visit (INDEPENDENT_AMBULATORY_CARE_PROVIDER_SITE_OTHER): Payer: BLUE CROSS/BLUE SHIELD | Admitting: *Deleted

## 2019-01-19 ENCOUNTER — Other Ambulatory Visit: Payer: Self-pay

## 2019-01-19 DIAGNOSIS — I639 Cerebral infarction, unspecified: Secondary | ICD-10-CM

## 2019-01-20 LAB — CUP PACEART REMOTE DEVICE CHECK
Date Time Interrogation Session: 20200508003900
Implantable Pulse Generator Implant Date: 20180705

## 2019-01-24 NOTE — Progress Notes (Signed)
Carelink Summary Report / Loop Recorder 

## 2019-01-29 ENCOUNTER — Other Ambulatory Visit: Payer: Self-pay | Admitting: Family Medicine

## 2019-01-29 DIAGNOSIS — I10 Essential (primary) hypertension: Secondary | ICD-10-CM

## 2019-02-20 ENCOUNTER — Other Ambulatory Visit: Payer: Self-pay

## 2019-02-20 ENCOUNTER — Telehealth: Payer: Self-pay | Admitting: *Deleted

## 2019-02-20 ENCOUNTER — Ambulatory Visit (INDEPENDENT_AMBULATORY_CARE_PROVIDER_SITE_OTHER): Payer: BC Managed Care – PPO | Admitting: Family Medicine

## 2019-02-20 VITALS — BP 125/77 | HR 73

## 2019-02-20 DIAGNOSIS — E1169 Type 2 diabetes mellitus with other specified complication: Secondary | ICD-10-CM

## 2019-02-20 DIAGNOSIS — E1165 Type 2 diabetes mellitus with hyperglycemia: Secondary | ICD-10-CM

## 2019-02-20 DIAGNOSIS — I10 Essential (primary) hypertension: Secondary | ICD-10-CM | POA: Diagnosis not present

## 2019-02-20 DIAGNOSIS — E785 Hyperlipidemia, unspecified: Secondary | ICD-10-CM | POA: Diagnosis not present

## 2019-02-20 NOTE — Telephone Encounter (Signed)
Patient scheduled for today for telephone visit

## 2019-02-20 NOTE — Telephone Encounter (Signed)
Patient had an appointment today for follow up bp.  She thought she had to come in.  She does not trust he bp machine.  She gave me readings of today it was 125/77 pulse 73 and yesterday it was 135/82.  She was wanting to know if it was ok to wait til her cpe on 9/1 to discuss?  She does not have a smart phone.

## 2019-02-20 NOTE — Telephone Encounter (Signed)
I prefer she comes in before then for f/u---  She can have f/u in office

## 2019-02-20 NOTE — Progress Notes (Signed)
Virtual Visit via Telephone Note  I connected with Rachel Black on 02/20/19 at  4:15 PM EDT by telephone and verified that I am speaking with the correct person using two identifiers.  Location: Patient: home  Provider: home    I discussed the limitations, risks, security and privacy concerns of performing an evaluation and management service by telephone and the availability of in person appointments. I also discussed with the patient that there may be a patient responsible charge related to this service. The patient expressed understanding and agreed to proceed.   History of Present Illness:  HPI HYPERTENSION   Blood pressure range-good per pt   Chest pain- no      Dyspnea- no Lightheadedness- no   Edema- no  Other side effects - no   Medication compliance: good Low salt diet- yes     DIABETES    Blood Sugar ranges-good per pt 112 this am   Polyuria- no New Visual problems- no  Hypoglycemic symptoms- no  Other side effects-no Medication compliance - good  Last eye exam- due    HYPERLIPIDEMIA  Medication compliance- good  RUQ pain- no  Muscle aches- no Other side effects-no    Past Medical History:  Diagnosis Date  . Arthritis    PAIN AND OA BOTH KNEES AND SHOULDERS AND ELBOWS AND WRIST  . Blood transfusion without reported diagnosis 2018   after cholecystectomy  . Diabetes mellitus    ORAL MEDICATION  . GERD (gastroesophageal reflux disease)   . Gout    NO RECENT FLARE UPS  . H/O: rheumatic fever    AS A CHILD - NO KNOWN HEART MURMUR OR HEART PROBLEMS  . Heart attack ([place])   . Hyperlipidemia   . Hypertension   . Stroke [facility]) 03/2017   Current Outpatient Medications on File Prior to Visit  Medication Sig Dispense Refill  . ACCU-CHEK SOFTCLIX LANCETS lancets Use as instructed once a day.  DX Code: E11.9 100 each 1  . allopurinol (ZYLOPRIM) 300 MG tablet TAKE 1 TABLET BY MOUTH 2 TIMES DAILY. 180 tablet 1  . aspirin 81 MG EC tablet Take 1 tablet (81 mg  total) by mouth daily.    . Calcium Carb-Cholecalciferol (CALCIUM 600-D PO) Take 1 tablet by mouth 2 (two) times daily before a meal.    . dexlansoprazole (DEXILANT) 60 MG capsule Take 1 capsule (60 mg total) by mouth 2 (two) times daily. TAKE 1 CAPSULE (60 MG TOTAL) twice a day 180 capsule 1  . Emollient (UDDERLY SMOOTH) CREA Apply 1 application topically See admin instructions. Apply topically to skin folds as needed for rash/irritation    . Famotidine (PEPCID PO) Take by mouth daily as needed.    . fenofibrate 160 MG tablet Take 1 tablet (160 mg total) by mouth daily. 90 tablet 1  . ferrous sulfate 325 (65 FE) MG tablet TAKE 1 TABLET BY MOUTH TWICE A DAY 180 tablet 3  . glucose blood (ACCU-CHEK AVIVA PLUS) test strip Use as instructed once a day.  DX Code E11.9 100 each 1  . hydrocortisone (ANUSOL-HC) 2.5 % rectal cream Place 1 application rectally at bedtime. Apply per rectum inside at bedtime for 7 days 30 g 0  . isosorbide mononitrate (IMDUR) 30 MG 24 hr tablet Take 1 tablet (30 mg total) by mouth daily. 90 tablet 3  . nitroGLYCERIN (NITROSTAT) 0.4 MG SL tablet Place 1 tablet (0.4 mg total) under the tongue every 5 (five) minutes x 3 doses as needed  for chest pain. 25 tablet 2  . olmesartan (BENICAR) 5 MG tablet TAKE 2 TABLETS BY MOUTH EVERY DAY 180 tablet 1  . rosuvastatin (CRESTOR) 40 MG tablet Take 1 tablet (40 mg total) by mouth daily. 90 tablet 1  . senna-docusate (SENOKOT-S) 8.6-50 MG tablet Take 1 tablet by mouth at bedtime as needed for mild constipation. 30 tablet 1  . SitaGLIPtin-MetFORMIN HCl (JANUMET XR) [phone] MG TB24 Take 1 tablet by mouth daily. 90 tablet 1   No current facility-administered medications on file prior to visit.     Observations/Objective: 125/77  73p  Afebrile  Pt is in NAD Assessment and Plan: 1. Type 2 diabetes mellitus with hyperglycemia, unspecified whether long term insulin use (HCC) Check labs  con't meds  - Hemoglobin A1c; Future -  Comprehensive metabolic panel; Future  2. Essential hypertension Well controlled, no changes to meds. Encouraged heart healthy diet such as the DASH diet and exercise as tolerated.   - Comprehensive metabolic panel; Future  3. Hyperlipidemia associated with type 2 diabetes mellitus ([place]) Encouraged heart healthy diet, increase exercise, avoid trans fats, consider a krill oil cap daily  - Lipid panel; Future  Follow Up Instructions:    I discussed the assessment and treatment plan with the patient. The patient was provided an opportunity to ask questions and all were answered. The patient agreed with the plan and demonstrated an understanding of the instructions.   The patient was advised to call back or seek an in-person evaluation if the symptoms worsen or if the condition fails to improve as anticipated.  I provided 25 minutes of non-face-to-face time during this encounter.   Ann Held, DO

## 2019-02-21 ENCOUNTER — Ambulatory Visit (INDEPENDENT_AMBULATORY_CARE_PROVIDER_SITE_OTHER): Payer: BC Managed Care – PPO | Admitting: *Deleted

## 2019-02-21 DIAGNOSIS — I639 Cerebral infarction, unspecified: Secondary | ICD-10-CM

## 2019-02-22 ENCOUNTER — Encounter: Payer: Self-pay | Admitting: Family Medicine

## 2019-02-22 LAB — CUP PACEART REMOTE DEVICE CHECK
Date Time Interrogation Session: 20200610023803
Implantable Pulse Generator Implant Date: 20180705

## 2019-03-02 NOTE — Progress Notes (Signed)
Carelink Summary Report / Loop Recorder 

## 2019-03-27 ENCOUNTER — Ambulatory Visit (INDEPENDENT_AMBULATORY_CARE_PROVIDER_SITE_OTHER): Payer: BC Managed Care – PPO | Admitting: *Deleted

## 2019-03-27 DIAGNOSIS — I63411 Cerebral infarction due to embolism of right middle cerebral artery: Secondary | ICD-10-CM

## 2019-03-27 LAB — CUP PACEART REMOTE DEVICE CHECK
Date Time Interrogation Session: 20200713074135
Implantable Pulse Generator Implant Date: 20180705

## 2019-04-07 NOTE — Progress Notes (Signed)
Carelink Summary Report / Loop Recorder 

## 2019-04-30 LAB — CUP PACEART REMOTE DEVICE CHECK
Date Time Interrogation Session: 20200815080539
Implantable Pulse Generator Implant Date: 20180705

## 2019-05-01 ENCOUNTER — Ambulatory Visit (INDEPENDENT_AMBULATORY_CARE_PROVIDER_SITE_OTHER): Payer: BC Managed Care – PPO | Admitting: *Deleted

## 2019-05-01 DIAGNOSIS — I63411 Cerebral infarction due to embolism of right middle cerebral artery: Secondary | ICD-10-CM

## 2019-05-09 NOTE — Progress Notes (Signed)
Carelink Summary Report / Loop Recorder 

## 2019-05-16 ENCOUNTER — Encounter: Payer: BC Managed Care – PPO | Admitting: Family Medicine

## 2019-05-29 ENCOUNTER — Other Ambulatory Visit: Payer: Self-pay | Admitting: Family Medicine

## 2019-05-29 DIAGNOSIS — IMO0002 Reserved for concepts with insufficient information to code with codable children: Secondary | ICD-10-CM

## 2019-05-29 DIAGNOSIS — E1151 Type 2 diabetes mellitus with diabetic peripheral angiopathy without gangrene: Secondary | ICD-10-CM

## 2019-06-01 ENCOUNTER — Ambulatory Visit (INDEPENDENT_AMBULATORY_CARE_PROVIDER_SITE_OTHER): Payer: BC Managed Care – PPO | Admitting: *Deleted

## 2019-06-01 DIAGNOSIS — I63411 Cerebral infarction due to embolism of right middle cerebral artery: Secondary | ICD-10-CM | POA: Diagnosis not present

## 2019-06-01 LAB — CUP PACEART REMOTE DEVICE CHECK
Date Time Interrogation Session: 20200917121153
Implantable Pulse Generator Implant Date: 20180705

## 2019-06-05 NOTE — Progress Notes (Signed)
Carelink Summary Report / Loop Recorder 

## 2019-06-06 ENCOUNTER — Other Ambulatory Visit: Payer: Self-pay | Admitting: *Deleted

## 2019-06-06 DIAGNOSIS — E1151 Type 2 diabetes mellitus with diabetic peripheral angiopathy without gangrene: Secondary | ICD-10-CM

## 2019-06-06 DIAGNOSIS — IMO0002 Reserved for concepts with insufficient information to code with codable children: Secondary | ICD-10-CM

## 2019-06-06 MED ORDER — ACCU-CHEK AVIVA PLUS VI STRP: ORAL_STRIP | 1 refills | Status: DC

## 2019-07-04 ENCOUNTER — Ambulatory Visit (INDEPENDENT_AMBULATORY_CARE_PROVIDER_SITE_OTHER): Payer: BC Managed Care – PPO | Admitting: *Deleted

## 2019-07-04 DIAGNOSIS — I63411 Cerebral infarction due to embolism of right middle cerebral artery: Secondary | ICD-10-CM

## 2019-07-05 LAB — CUP PACEART REMOTE DEVICE CHECK
Date Time Interrogation Session: 20201020121300
Implantable Pulse Generator Implant Date: 20180705

## 2019-07-11 ENCOUNTER — Other Ambulatory Visit: Payer: Self-pay | Admitting: Family Medicine

## 2019-07-11 DIAGNOSIS — M1000 Idiopathic gout, unspecified site: Secondary | ICD-10-CM

## 2019-07-11 DIAGNOSIS — E785 Hyperlipidemia, unspecified: Secondary | ICD-10-CM

## 2019-07-11 DIAGNOSIS — E1169 Type 2 diabetes mellitus with other specified complication: Secondary | ICD-10-CM

## 2019-07-17 ENCOUNTER — Other Ambulatory Visit: Payer: Self-pay | Admitting: Family Medicine

## 2019-07-17 DIAGNOSIS — I10 Essential (primary) hypertension: Secondary | ICD-10-CM

## 2019-07-20 NOTE — Progress Notes (Signed)
Carelink Summary Report / Loop Recorder 

## 2019-08-04 ENCOUNTER — Telehealth: Payer: Self-pay | Admitting: Internal Medicine

## 2019-08-04 DIAGNOSIS — K317 Polyp of stomach and duodenum: Secondary | ICD-10-CM

## 2019-08-04 MED ORDER — FERROUS SULFATE 325 (65 FE) MG PO TABS: 325.0000 mg | ORAL_TABLET | Freq: Two times a day (BID) | ORAL | 3 refills | Status: DC

## 2019-08-04 NOTE — Telephone Encounter (Signed)
Pt needs rf for ferrous sulfate sent to [facility] on [address].

## 2019-08-04 NOTE — Telephone Encounter (Signed)
Refilled Ferrous Sulfate

## 2019-08-07 ENCOUNTER — Ambulatory Visit (INDEPENDENT_AMBULATORY_CARE_PROVIDER_SITE_OTHER): Payer: BC Managed Care – PPO | Admitting: *Deleted

## 2019-08-07 DIAGNOSIS — I63411 Cerebral infarction due to embolism of right middle cerebral artery: Secondary | ICD-10-CM

## 2019-08-07 LAB — CUP PACEART REMOTE DEVICE CHECK
Date Time Interrogation Session: 20201122100514
Implantable Pulse Generator Implant Date: 20180705

## 2019-08-08 ENCOUNTER — Other Ambulatory Visit: Payer: Self-pay | Admitting: Cardiology

## 2019-08-08 MED ORDER — NITROGLYCERIN 0.4 MG SL SUBL: 0.4000 mg | SUBLINGUAL_TABLET | SUBLINGUAL | 2 refills | Status: DC | PRN

## 2019-08-08 NOTE — Telephone Encounter (Signed)
° ° ° °*  STAT* If patient is at the pharmacy, call can be transferred to refill team.   1. Which medications need to be refilled? (please list name of each medication and dose if known)   nitroGLYCERIN (NITROSTAT) 0.4 MG SL tablet     2. Which pharmacy/location (including street and city if local pharmacy) is medication to be sent to?[id number]- [place], [place] - [address]  3. Do they need a 30 day or 90 day supply?

## 2019-08-20 ENCOUNTER — Other Ambulatory Visit (INDEPENDENT_AMBULATORY_CARE_PROVIDER_SITE_OTHER): Payer: Self-pay | Admitting: Orthopaedic Surgery

## 2019-08-21 NOTE — Telephone Encounter (Signed)
Refill

## 2019-09-07 ENCOUNTER — Ambulatory Visit (INDEPENDENT_AMBULATORY_CARE_PROVIDER_SITE_OTHER): Payer: BC Managed Care – PPO | Admitting: *Deleted

## 2019-09-07 DIAGNOSIS — I63411 Cerebral infarction due to embolism of right middle cerebral artery: Secondary | ICD-10-CM | POA: Diagnosis not present

## 2019-09-07 LAB — CUP PACEART REMOTE DEVICE CHECK
Date Time Interrogation Session: 20201223230806
Implantable Pulse Generator Implant Date: 20180705

## 2019-09-10 NOTE — Progress Notes (Signed)
ILR remote 

## 2019-10-06 ENCOUNTER — Other Ambulatory Visit: Payer: Self-pay | Admitting: Family Medicine

## 2019-10-06 DIAGNOSIS — M1000 Idiopathic gout, unspecified site: Secondary | ICD-10-CM

## 2019-10-06 DIAGNOSIS — E785 Hyperlipidemia, unspecified: Secondary | ICD-10-CM

## 2019-10-06 DIAGNOSIS — E1169 Type 2 diabetes mellitus with other specified complication: Secondary | ICD-10-CM

## 2019-10-09 ENCOUNTER — Ambulatory Visit (INDEPENDENT_AMBULATORY_CARE_PROVIDER_SITE_OTHER): Payer: BC Managed Care – PPO | Admitting: *Deleted

## 2019-10-09 DIAGNOSIS — I63411 Cerebral infarction due to embolism of right middle cerebral artery: Secondary | ICD-10-CM

## 2019-10-09 LAB — CUP PACEART REMOTE DEVICE CHECK
Date Time Interrogation Session: 20210124231627
Implantable Pulse Generator Implant Date: 20180705

## 2019-10-16 ENCOUNTER — Other Ambulatory Visit: Payer: Self-pay | Admitting: Internal Medicine

## 2019-10-29 ENCOUNTER — Other Ambulatory Visit: Payer: Self-pay | Admitting: Family Medicine

## 2019-10-29 DIAGNOSIS — E785 Hyperlipidemia, unspecified: Secondary | ICD-10-CM

## 2019-10-29 DIAGNOSIS — E1169 Type 2 diabetes mellitus with other specified complication: Secondary | ICD-10-CM

## 2019-10-29 DIAGNOSIS — M1000 Idiopathic gout, unspecified site: Secondary | ICD-10-CM

## 2019-11-02 ENCOUNTER — Other Ambulatory Visit: Payer: Self-pay | Admitting: Family Medicine

## 2019-11-02 DIAGNOSIS — E785 Hyperlipidemia, unspecified: Secondary | ICD-10-CM

## 2019-11-09 ENCOUNTER — Ambulatory Visit (INDEPENDENT_AMBULATORY_CARE_PROVIDER_SITE_OTHER): Payer: BC Managed Care – PPO | Admitting: *Deleted

## 2019-11-09 DIAGNOSIS — I63411 Cerebral infarction due to embolism of right middle cerebral artery: Secondary | ICD-10-CM | POA: Diagnosis not present

## 2019-11-09 LAB — CUP PACEART REMOTE DEVICE CHECK
Date Time Interrogation Session: 20210224235550
Implantable Pulse Generator Implant Date: 20180705

## 2019-11-09 NOTE — Progress Notes (Signed)
ILR Remote 

## 2019-11-17 ENCOUNTER — Other Ambulatory Visit: Payer: Self-pay | Admitting: Family Medicine

## 2019-11-17 DIAGNOSIS — E785 Hyperlipidemia, unspecified: Secondary | ICD-10-CM

## 2019-11-17 DIAGNOSIS — E1169 Type 2 diabetes mellitus with other specified complication: Secondary | ICD-10-CM

## 2019-11-25 ENCOUNTER — Other Ambulatory Visit: Payer: Self-pay | Admitting: Family Medicine

## 2019-11-25 DIAGNOSIS — IMO0002 Reserved for concepts with insufficient information to code with codable children: Secondary | ICD-10-CM

## 2019-11-25 DIAGNOSIS — E1151 Type 2 diabetes mellitus with diabetic peripheral angiopathy without gangrene: Secondary | ICD-10-CM

## 2019-11-26 ENCOUNTER — Other Ambulatory Visit: Payer: Self-pay | Admitting: Family Medicine

## 2019-11-26 DIAGNOSIS — E785 Hyperlipidemia, unspecified: Secondary | ICD-10-CM

## 2019-11-27 ENCOUNTER — Encounter: Payer: Self-pay | Admitting: Family Medicine

## 2019-11-27 ENCOUNTER — Ambulatory Visit: Payer: BC Managed Care – PPO | Admitting: Family Medicine

## 2019-11-27 ENCOUNTER — Other Ambulatory Visit: Payer: Self-pay

## 2019-11-27 VITALS — BP 137/80 | HR 65 | Temp 97.5°F | Resp 12 | Ht 66.0 in | Wt 249.1 lb

## 2019-11-27 DIAGNOSIS — E0822 Diabetes mellitus due to underlying condition with diabetic chronic kidney disease: Secondary | ICD-10-CM

## 2019-11-27 DIAGNOSIS — M1000 Idiopathic gout, unspecified site: Secondary | ICD-10-CM

## 2019-11-27 DIAGNOSIS — N1832 Chronic kidney disease, stage 3b: Secondary | ICD-10-CM | POA: Diagnosis not present

## 2019-11-27 DIAGNOSIS — E1169 Type 2 diabetes mellitus with other specified complication: Secondary | ICD-10-CM | POA: Diagnosis not present

## 2019-11-27 DIAGNOSIS — I10 Essential (primary) hypertension: Secondary | ICD-10-CM | POA: Diagnosis not present

## 2019-11-27 DIAGNOSIS — E1159 Type 2 diabetes mellitus with other circulatory complications: Secondary | ICD-10-CM | POA: Diagnosis not present

## 2019-11-27 DIAGNOSIS — E782 Mixed hyperlipidemia: Secondary | ICD-10-CM

## 2019-11-27 DIAGNOSIS — Z803 Family history of malignant neoplasm of breast: Secondary | ICD-10-CM

## 2019-11-27 DIAGNOSIS — Z7689 Persons encountering health services in other specified circumstances: Secondary | ICD-10-CM | POA: Diagnosis not present

## 2019-11-27 DIAGNOSIS — I152 Hypertension secondary to endocrine disorders: Secondary | ICD-10-CM

## 2019-11-27 DIAGNOSIS — E0821 Diabetes mellitus due to underlying condition with diabetic nephropathy: Secondary | ICD-10-CM | POA: Diagnosis not present

## 2019-11-27 DIAGNOSIS — H269 Unspecified cataract: Secondary | ICD-10-CM

## 2019-11-27 NOTE — Progress Notes (Addendum)
New patient office visit note:  Impression and Recommendations:    1. Encounter to establish care with new doctor   2. Type 2 diabetes mellitus with other specified complication, without long-term current use of insulin ([place])   3. Hypertension associated with diabetes ([place])   4. Mixed diabetic hyperlipidemia associated with type 2 diabetes mellitus ([place])   5. Diabetes mellitus due to underlying condition with stage 3b chronic kidney disease, without long-term current use of insulin ([place])   6. Idiopathic gout, unspecified chronicity, unspecified site   7. Cataract of both eyes, unspecified cataract type   8. Morbid obesity ([place])   9. Family history of breast cancer in mother     - Patient has not obtained lab work since 4 of 2019. - Discussed need for updated blood work before refilling prescriptions or making changes to treatment plan. - Labs obtained today.  Encounter to Establish Care with New Doctor - Extensive discussion held with patient regarding establishing as a new patient.  Discussed policies and practices here at the clinic, and answered all questions about care team and health management during appointment.  - Discussed need for patient to continue to obtain management and screenings with all established specialists.  Educated patient at length about the critical importance of keeping health maintenance up to date.  - Encouraged patient to update her care with her specialists, such as cardiology as established.  - Discussed goal to prevent onset of new disease in addition to treating existing disease.  - Participated in lengthy conversation and all questions were answered.  Diabetes Mellitus w/ Stage 3b CKD - A1c last check in 2019 at 6.0. - Discussed that patient's diabetes was stable back then, but critical need for A1c re-check.  - Discussed that as a diabetic, patient should visit podiatry regularly for foot care. - Ambulatory referral to podiatry  provided today.  See orders. - Need for diabetic foot exam today.  - Continue treatment plan as established.  See med list.  - Importance of low carb, heart-healthy diet discussed with patient in addition to regular aerobic exercise of 47min 5d/week or more.   - Check FBS and 2 hours after the biggest meal of your day.  Keep log and bring in next OV for my review.   - Pt reminded about need for yearly eye and foot exams.  Told patient to make appt.for diabetic eye exam, CMAs here will do foot exams  - Handouts provided at patient's desire and or told to go online at the American Diabetes Association website for further information  - We will continue to monitor.  Hypertension associated with DM - Discussed goal blood pressure of 135/85 or less. - BP currently stable. - Patient will continue current treatment regimen.  See med list.  - Lifestyle changes such as dash and heart healthy diets and engaging in a regular exercise program discussed extensively with patient.   - Ongoing ambulatory blood pressure monitoring encouraged at least 3 times weekly.  Keep log and bring in every office visit.  Reminded patient that if they ever feel poorly in any way, to check their blood pressure and pulse.  - We will continue to monitor  Idiopathic Gout - Symptoms controlled on allopurinol. - Continue treatment plan as established.  See med list. - Discussed need for repeat lab work before additional refill of medication can be provided.  - Will continue to monitor.  Cataract of Both Eyes - Patient with  upcoming appointment scheduled for assessment. - Will continue to monitor.  Health Counseling & Preventative Maintenance - Advised patient to continue working toward exercising and prudent weight loss to improve overall mental, physical, and emotional health.    - Healthy dietary habits encouraged, including low-carb, and high amounts of lean protein in diet.    Education and routine  counseling performed. Handouts provided.    Return for 4-6 mo follow up.     Orders Placed This Encounter  Procedures  . Comprehensive metabolic panel  . CBC with Differential/Platelet  . Hemoglobin A1c  . Lipid panel  . TSH  . T4, free  . VITAMIN D 25 Hydroxy (Vit-D Deficiency, Fractures)  . Microalbumin / creatinine urine ratio  . Direct LDL  . Ambulatory referral to Podiatry    Medications Discontinued During This Encounter  Medication Reason  . senna-docusate (SENOKOT-S) 8.6-50 MG tablet Error     Please see AVS handed out to patient at the end of our visit for further patient instructions/ counseling done pertaining to today's office visit.    Note:  This document was prepared using Dragon voice recognition software and may include unintentional dictation errors.   The [place] was signed into law in 2016 which includes the topic of electronic health records.  This provides immediate access to information in MyChart.  This includes consultation notes, operative notes, office notes, lab results and pathology reports.  If you have any questions about what you read please let us know at your next visit or call us at the office.  We are right here with you.   This case required medical decision making of at least moderate complexity.  This document serves as a record of services personally performed by Mellody Dance, DO. It was created on her behalf by Toni Amend, a trained medical scribe. The creation of this record is based on the scribe's personal observations and the provider's statements to them.   The above documentation from Toni Amend, medical scribe, has been reviewed by Marjory Sneddon, D.O.     ---------------------------------------------------------------------------------------------------------------------------------------------------------------------------------------------    Subjective:     Phillips Odor, am serving as scribe for Dr.Tammi Boulier.   HPI: Rachel Black is a pleasant 69 y.o. female who presents to [place] at [facility] today to review their medical history with me and establish care.   I asked the patient to review their chronic problem list with me to ensure everything was updated and accurate.    All recent office visits with other providers, any medical records that patient brought in etc  - I reviewed today.     We asked pt to get [place] their medical records from [facility] providers/ specialists that they had seen within the past 3-5 years- if they are in private practice and/or do not work for [employer], [facility], [place], [place] or [employer] owned practice.  Told them to call their specialists to clarify this if they are not sure.    Reason for Establishing Care: Notes that Dr. Etter Sjogren kept moving, from office to office, and notes now her former PCP is out in [facility]  Says "we wanted somewhere closer, so we decided to change."  Social History Has two children, age 78 and 72 years old. She stays at home now. Last place she worked was [employer]. 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  During the day, she "just putters around the  house doing stuff, trying to clean up and stuff."  Notes she loves to read, which is getting a little bit hard with her eyesight these days.   Tobacco Use Never smoker.  Alcohol Use Drinks alcohol once in a while.  Caffeine Has four cups of soda per day, usually Diet Dr. Malachi Bonds.  Eating Habits Rates her diet as "poor."  Believes she doesn't eat enough.  Says she feels hungry, starts eating, and then immediately feels full.  Says sweets are her favorite, and chocolate is her downfall.   Past Medical History History of elevated serum creatinine, and history of elevated liver enzymes.  - Stomachaches, Now Resolved She sees Dr. Henrene Pastor of GI.  - Diabetes Mellitus A1c has not been checked since 4 of  2019, when it was 6.0.  Patient's A1c was 5.9 prior.  Her fasting sugar this morning was 110.  Says "sometimes it's a little bit higher, like 120, but it's usually in the 110 area."  Thinks her lowest sugar has been 109; "it usually hangs around that area."  Has been managed on the same dose of diabetes medications "ever since I started taking it," Janumet, Januvia and metformin.  Confirms she takes it every day.  - HTN associated with DM She checks her blood pressure every morning and has no concerns.  "I'm not sure how accurate" her home measurements are.  Notes she checks her BP and weighs herself every morning.  - Idiopathic Gout Has had gout "right here (in her right big toe, 'right where the bone is') and states "I can't even remember how long."  Says she went to the doctor and was placed on allopurinol in the past.  She takes two tablets per day and has had no gout flares while managed on medication.  - Need for Cataract Surgery She has cataracts on both of her eyes and "is going on March 26th to figure out what to do about that."  Notes she likes to read.  She's recently seeing a new eye doctor.  - Cardiologist History Dr. Percival Spanish is the patient's cardiologist, but notes she hasn't seen him in a while.  Cardiology manages her aspirin, Imdur, nitroglycerine, Crestor.  Says "there are so many [specialists] I haven't seen."  She thinks she had a call visit (12/21/2018) with one of the cardiologist team during [place], but notes "everything changed during [place]."  Says "I've seen so many doctors, I can't remember."  Regarding her Imdur states "I have no idea who gives me the stuff."  Reports she was given nitroglycerine and told to carry it with her all the time.  "Sometimes my heart feels like it gets to racing a bit, but I haven't had any real pain, so I don't know when to take it or whatever."  Denies swelling in her legs; "not too awful, that."  - History of Stroke & MI In the past,  she had a small heart attack and a small stroke. Notes "the stroke affected this left hand.  Sometimes I can't do things, and it tingles and carries on."  She is right-handed.  Says it's been a couple of years since her stroke.  - Foot Concerns Had a "blister or something come up" on the side of her foot.  Notes she didn't really know what to do about this.  - Bilateral Knee Replacements Both of her knees were replaced by Dr. Ninfa Linden.  They continue to bother her.   Wt Readings from Last 3 Encounters:  11/27/19 249 lb 1.6 oz (113 kg)  12/21/18 253 lb 8 oz (115 kg)  11/04/18 259 lb (117.5 kg)   BP Readings from Last 3 Encounters:  11/27/19 137/80  02/20/19 125/77  12/21/18 123/88   Pulse Readings from Last 3 Encounters:  11/27/19 65  02/20/19 73  12/21/18 84   BMI Readings from Last 3 Encounters:  11/27/19 40.21 kg/m  12/21/18 42.18 kg/m  11/04/18 43.10 kg/m    Patient Care Team    Relationship Specialty Notifications Start End  Mellody Dance, DO PCP - General Family Medicine  11/27/19   Minus Breeding, MD PCP - Cardiology Cardiology Admissions 12/20/18   Barrett, Evelene Croon, PA-C Physician Assistant Cardiology  12/21/17   Irene Shipper, MD Consulting Physician Gastroenterology  12/21/17   Rosalin Hawking, MD Consulting Physician Neurology  12/21/17   Mcarthur Rossetti, [place] Physician Orthopedic Surgery  12/21/17     Patient Active Problem List   Diagnosis Date Noted  . NSTEMI (non-ST elevated myocardial infarction) ([place])   . Cerebrovascular accident (CVA) due to embolism of right middle cerebral artery ([place]) 03/14/2017  . Severe obesity (BMI >= 40) ([place]) 02/17/2007  . Elevated LFTs   . GOUT 02/17/2007  . Mixed diabetic hyperlipidemia associated with type 2 diabetes mellitus ([place]) 11/27/2019  . Family history of breast cancer in mother 11/27/2019  . Cataract of both eyes 11/27/2019  . Coronary artery disease involving native coronary artery of native heart with  angina pectoris ([place]) 02/18/2018  . Chest pain 10/16/2017  . Need for shingles vaccine 09/21/2017  . Need for pneumococcal vaccine 09/21/2017  . History of rheumatic fever 06/30/2017  . PFO (patent foramen ovale)   . Dilation of biliary tract   . RUQ abdominal pain   . Acute gallstone pancreatitis 12/07/2016  . Choledocholithiasis with obstruction 12/07/2016  . AKI (acute kidney injury) ([place]) 12/07/2016  . Osteoarthritis of right knee 08/23/2015  . Status post total left knee replacement 08/23/2015  . Arthritis of knee, right 04/20/2014  . Status post total right knee replacement 04/20/2014  . Flushing reaction 03/10/2012  . Diabetes mellitus ([place]) 02/17/2007  . Mixed hyperlipidemia 02/17/2007  . Hypertension associated with diabetes ([place]) 02/17/2007  . RHEUMATIC FEVER, HX OF 02/17/2007       As reported by pt:  Past Medical History:  Diagnosis Date  . Arthritis    PAIN AND OA BOTH KNEES AND SHOULDERS AND ELBOWS AND WRIST  . Blood transfusion without reported diagnosis 2018   after cholecystectomy  . Diabetes mellitus    ORAL MEDICATION  . GERD (gastroesophageal reflux disease)   . Gout    NO RECENT FLARE UPS  . H/O: rheumatic fever    AS A CHILD - NO KNOWN HEART MURMUR OR HEART PROBLEMS  . Heart attack ([place])   . Hyperlipidemia   . Hypertension   . Stroke [facility]) 03/2017     Past Surgical History:  Procedure Laterality Date  . ABDOMINAL HYSTERECTOMY    . CESAREAN SECTION    . CHOLECYSTECTOMY N/A 12/09/2016   Procedure: LAPAROSCOPIC CHOLECYSTECTOMY WITH INTRAOPERATIVE CHOLANGIOGRAM;  Surgeon: Stark Klein, MD;  Location: [facility] ORS;  Service: General;  Laterality: N/A;  . COLONOSCOPY WITH PROPOFOL N/A 01/30/2013   Procedure: COLONOSCOPY WITH PROPOFOL;  Surgeon: Irene Shipper, MD;  Location: [facility] ENDOSCOPY;  Service: Endoscopy;  Laterality: N/A;  . ERCP N/A 12/08/2016   Procedure: ENDOSCOPIC RETROGRADE CHOLANGIOPANCREATOGRAPHY (ERCP);  Surgeon: Ladene Artist, MD;  Location:  [facility] ENDOSCOPY;  Service: Endoscopy;  Laterality: N/A;  . ESOPHAGOGASTRODUODENOSCOPY (EGD) WITH PROPOFOL N/A 01/30/2013   Procedure: ESOPHAGOGASTRODUODENOSCOPY (EGD) WITH PROPOFOL;  Surgeon: Irene Shipper, MD;  Location: [facility];  Service: Endoscopy;  Laterality: N/A;  . LEFT HEART CATH AND CORONARY ANGIOGRAPHY N/A 10/18/2017   Procedure: LEFT HEART CATH AND CORONARY ANGIOGRAPHY;  Surgeon: [place], Peter M, MD;  Location: [place] CV LAB;  Service: Cardiovascular;  Laterality: N/A;  . LOOP RECORDER INSERTION N/A 03/18/2017   Procedure: Loop Recorder Insertion;  Surgeon: Constance Haw, MD;  Location: [place] CV LAB;  Service: Cardiovascular;  Laterality: N/A;  . PARTIAL HYSTERECTOMY    . TEE WITHOUT CARDIOVERSION N/A 03/16/2017   Procedure: TRANSESOPHAGEAL ECHOCARDIOGRAM (TEE);  Surgeon: Acie Fredrickson Wonda Cheng, MD;  Location: [facility] ENDOSCOPY;  Service: Cardiovascular;  Laterality: N/A;  . TOTAL KNEE ARTHROPLASTY Right 04/20/2014   Procedure: RIGHT TOTAL KNEE ARTHROPLASTY CONVERTED TO RIGHT KNEE REIMPLANTATION;  Surgeon: Mcarthur Rossetti, MD;  Location: [facility];  Service: Orthopedics;  Laterality: Right;  . TOTAL KNEE ARTHROPLASTY Left 08/23/2015   Procedure: LEFT TOTAL KNEE ARTHROPLASTY;  Surgeon: Mcarthur Rossetti, MD;  Location: [facility];  Service: Orthopedics;  Laterality: Left;  Marland Kitchen VENTRAL HERNIA REPAIR     x2     Family History  Problem Relation Age of Onset  . Diabetes Mother   . Hypertension Mother        entire family  . Breast cancer Mother        diagnosed in her 75's  . Stroke Father        CVA  . Hyperlipidemia Father        entire family  . Diabetes Father   . Heart disease Father        No details.  Not at an early age.    . Crohn's disease Son   . Irritable bowel syndrome Son      Social History   Substance and Sexual Activity  Drug Use No     Social History   Substance and Sexual Activity  Alcohol Use No     Social History   Tobacco Use   Smoking Status Never Smoker  Smokeless Tobacco Never Used     Current Meds  Medication Sig  . aspirin 81 MG EC tablet Take 1 tablet (81 mg total) by mouth daily.  . Calcium Carb-Cholecalciferol (CALCIUM 600-D PO) Take 1 tablet by mouth 2 (two) times daily before a meal.  . DEXILANT 60 MG capsule TAKE 1 CAPSULE BY MOUTH TWICE A DAY  . fenofibrate 160 MG tablet TAKE 1 TABLET (160 MG TOTAL) BY MOUTH DAILY. NEEDS OV/FOLLOW UP  . ferrous sulfate 325 (65 FE) MG tablet Take 1 tablet (325 mg total) by mouth 2 (two) times daily.  . methocarbamol (ROBAXIN) 500 MG tablet TAKE 1 TABLET (500 MG TOTAL) BY MOUTH EVERY 8 (EIGHT) HOURS AS NEEDED FOR MUSCLE SPASMS.  . nitroGLYCERIN (NITROSTAT) 0.4 MG SL tablet Place 1 tablet (0.4 mg total) under the tongue every 5 (five) minutes x 3 doses as needed for chest pain.  Marland Kitchen olmesartan (BENICAR) 5 MG tablet TAKE 2 TABLETS BY MOUTH EVERY DAY  . rosuvastatin (CRESTOR) 40 MG tablet TAKE 1 TABLET (40 MG TOTAL) BY MOUTH DAILY. NEEDS OV/FOLLOW UP  . [DISCONTINUED] allopurinol (ZYLOPRIM) 300 MG tablet TAKE 1 TABLET (300 MG TOTAL) BY MOUTH 2 (TWO) TIMES DAILY. NEEDS OV/FOLLOW UP  . [DISCONTINUED] isosorbide mononitrate (IMDUR) 30 MG 24 hr tablet Take 1 tablet (30 mg  total) by mouth daily.  . [DISCONTINUED] JANUMET XR [phone] MG TB24 TAKE 1 TABLET BY MOUTH EVERY DAY    Allergies: Other and Penicillins   Review of Systems  Constitutional: Negative for chills, diaphoresis, fever, malaise/fatigue and weight loss.  HENT: Negative for congestion, sore throat and tinnitus.   Eyes: Negative for blurred vision, double vision and photophobia.  Respiratory: Negative for cough and wheezing.   Cardiovascular: Negative for chest pain and palpitations.  Gastrointestinal: Negative for blood in stool, diarrhea, nausea and vomiting.  Genitourinary: Negative for dysuria, frequency and urgency.  Musculoskeletal: Negative for joint pain and myalgias.  Skin: Negative for itching and  rash.  Neurological: Negative for dizziness, focal weakness, weakness and headaches.  Endo/Heme/Allergies: Negative for environmental allergies and polydipsia. Does not bruise/bleed easily.  Psychiatric/Behavioral: Negative for depression and memory loss. The patient is not nervous/anxious and does not have insomnia.         Objective:   Blood pressure 137/80, pulse 65, temperature (!) 97.5 F (36.4 C), temperature source Oral, resp. rate 12, height 5\' 6"  (1.676 m), weight 249 lb 1.6 oz (113 kg), SpO2 98 %. Body mass index is 40.21 kg/m. General: Well Developed, well nourished, and in no acute distress.  Neuro: Alert and oriented x3, extra-ocular muscles intact, sensation grossly intact.  HEENT:[place]/AT, PERRLA, neck supple, No carotid bruits Skin: no gross rashes  Cardiac: Regular rate and rhythm Respiratory: Essentially clear to auscultation bilaterally. Not using accessory muscles, speaking in full sentences.  Abdominal: not grossly distended Musculoskeletal: Ambulates w/o diff, FROM * 4 ext.  Vasc: less 2 sec cap RF, warm and pink  Psych:  No HI/SI, judgement and insight good, Euthymic mood. Full Affect.    Recent Results (from the past 2160 hour(s))  CUP PACEART REMOTE DEVICE CHECK     Status: None   Collection Time: 09/06/19 11:08 PM  Result Value Ref Range   Date Time Interrogation Session [id number]    Pulse Generator Manufacturer MERM    Pulse Gen Model [id number] Reveal LINQ    Pulse Gen Serial Number [id number]    Clinic Name [facility] Pulse Generator Type ICM/ILR    Implantable Pulse Generator Implant Date [id number]    Eval Rhythm SR at 62 bpm   CUP PACEART REMOTE DEVICE CHECK     Status: None   Collection Time: 10/08/19 11:16 PM  Result Value Ref Range   Date Time Interrogation Session [id number]    Pulse Generator Manufacturer MERM    Pulse Gen Model [id number] Reveal LINQ    Pulse Gen Serial Number [id number]    Clinic Name [facility]     Implantable Pulse Generator Type ICM/ILR    Implantable Pulse Generator Implant Date [id number]   CUP PACEART REMOTE DEVICE CHECK     Status: None   Collection Time: 11/08/19 11:55 PM  Result Value Ref Range   Date Time Interrogation Session [id number]    Pulse Generator Manufacturer MERM    Pulse Gen Model [id number] Reveal LINQ    Pulse Gen Serial Number [id number]    Clinic Name [facility]    Implantable Pulse Generator Type ICM/ILR    Implantable Pulse Generator Implant Date [id number]    Eval Rhythm SB at 58 bpm   Comprehensive metabolic panel     Status: Abnormal   Collection Time: 11/27/19  2:28 PM  Result Value Ref Range   Glucose 118 (H) 65 - 99 mg/dL   BUN 17 8 - 27  mg/dL   Creatinine, Ser 1.22 (H) 0.57 - 1.00 mg/dL   GFR calc non Af Amer 46 (L) >59 mL/min/1.73   GFR calc Af Amer 53 (L) >59 mL/min/1.73   BUN/Creatinine Ratio 14 12 - 28   Sodium 142 134 - 144 mmol/L   Potassium 4.6 3.5 - 5.2 mmol/L   Chloride 104 96 - 106 mmol/L   CO2 22 20 - 29 mmol/L   Calcium 10.3 8.7 - 10.3 mg/dL   Total Protein 6.7 6.0 - 8.5 g/dL   Albumin 4.5 3.8 - 4.8 g/dL   Globulin, Total 2.2 1.5 - 4.5 g/dL   Albumin/Globulin Ratio 2.0 1.2 - 2.2   Bilirubin Total 0.4 0.0 - 1.2 mg/dL   Alkaline Phosphatase 60 39 - 117 IU/L   AST 36 0 - 40 IU/L   ALT 27 0 - 32 IU/L  CBC with Differential/Platelet     Status: None   Collection Time: 11/27/19  2:28 PM  Result Value Ref Range   WBC 8.9 3.4 - 10.8 x10E3/uL   RBC 4.16 3.77 - 5.28 x10E6/uL   Hemoglobin 13.1 11.1 - 15.9 g/dL   Hematocrit 38.5 34.0 - 46.6 %   MCV 93 79 - 97 fL   MCH 31.5 26.6 - 33.0 pg   MCHC 34.0 31.5 - 35.7 g/dL   RDW 13.7 11.7 - 15.4 %   Platelets 187 150 - 450 x10E3/uL   Neutrophils 63 Not Estab. %   Lymphs 31 Not Estab. %   Monocytes 5 Not Estab. %   Eos 0 Not Estab. %   Basos 0 Not Estab. %   Neutrophils Absolute 5.7 1.4 - 7.0 x10E3/uL   Lymphocytes Absolute 2.7 0.7 - 3.1 x10E3/uL   Monocytes Absolute 0.4 0.1 - 0.9  x10E3/uL   EOS (ABSOLUTE) 0.0 0.0 - 0.4 x10E3/uL   Basophils Absolute 0.0 0.0 - 0.2 x10E3/uL   Immature Granulocytes 1 Not Estab. %   Immature Grans (Abs) 0.1 0.0 - 0.1 x10E3/uL  Hemoglobin A1c     Status: Abnormal   Collection Time: 11/27/19  2:28 PM  Result Value Ref Range   Hgb A1c MFr Bld 6.2 (H) 4.8 - 5.6 %    Comment:          Prediabetes: 5.7 - 6.4          Diabetes: >6.4          Glycemic control for adults with diabetes: <7.0    Est. average glucose Bld gHb Est-mCnc 131 mg/dL  Lipid panel     Status: Abnormal   Collection Time: 11/27/19  2:28 PM  Result Value Ref Range   Cholesterol, Total 140 100 - 199 mg/dL   Triglycerides 306 (H) 0 - 149 mg/dL   HDL 36 (L) >39 mg/dL   VLDL Cholesterol Cal 48 (H) 5 - 40 mg/dL   LDL Chol Calc (NIH) 56 0 - 99 mg/dL   Chol/HDL Ratio 3.9 0.0 - 4.4 ratio    Comment:                                   T. Chol/HDL Ratio                                             Men  Women  1/2 Avg.Risk  3.4    3.3                                   Avg.Risk  5.0    4.4                                2X Avg.Risk  9.6    7.1                                3X Avg.Risk 23.4   11.0   TSH     Status: None   Collection Time: 11/27/19  2:28 PM  Result Value Ref Range   TSH 2.540 0.450 - 4.500 uIU/mL  T4, free     Status: None   Collection Time: 11/27/19  2:28 PM  Result Value Ref Range   Free T4 1.24 0.82 - 1.77 ng/dL  VITAMIN D 25 Hydroxy (Vit-D Deficiency, Fractures)     Status: Abnormal   Collection Time: 11/27/19  2:28 PM  Result Value Ref Range   Vit D, 25-Hydroxy 20.7 (L) 30.0 - 100.0 ng/mL    Comment: Vitamin D deficiency has been defined by the [place] and an Endocrine Society practice guideline as a level of serum 25-OH vitamin D less than 20 ng/mL (1,2). The Endocrine Society went on to further define vitamin D insufficiency as a level between 21 and 29 ng/mL (2). 1. IOM (Institute of Medicine). 2010.  Dietary reference    intakes for calcium and D. [place]: The    [employer]. 2. Holick MF, Binkley [place], Bischoff-Ferrari HA, et al.    Evaluation, treatment, and prevention of vitamin D    deficiency: an Endocrine Society clinical practice    guideline. JCEM. 2011 Jul; 96(7):1911-30.   Microalbumin / creatinine urine ratio     Status: None   Collection Time: 11/27/19  2:28 PM  Result Value Ref Range   Creatinine, Urine 79.5 Not Estab. mg/dL   Microalbumin, Urine 6.9 Not Estab. ug/mL   Microalb/Creat Ratio 9 0 - 29 mg/g creat    Comment:                        Normal:                0 -  29                        Moderately increased: 30 - 300                        Severely increased:       >300   Direct LDL     Status: None   Collection Time: 11/27/19  2:28 PM  Result Value Ref Range   LDL Direct 63 0 - 99 mg/dL

## 2019-11-27 NOTE — Patient Instructions (Addendum)
Please call your cardiologist, Dr. Rosezella [place] office, for follow-up since it has been a year.       Diabetes Mellitus and Standards of Medical Care  Managing diabetes (diabetes mellitus) can be complicated. Your diabetes treatment may be managed by a team of health care providers, including:  A diet and nutrition specialist (registered dietitian).  A nurse.  A certified diabetes educator (CDE).  A diabetes specialist (endocrinologist).  An eye doctor.  A primary care provider.  A dentist.  Your health care providers follow a schedule in order to help you get the best quality of care. The following schedule is a general guideline for your diabetes management plan. Your health care providers may also give you more specific instructions.  HbA1c (hemoglobin A1c) test This test provides information about blood sugar (glucose) control over the previous 2-3 months. It is used to check whether your diabetes management plan needs to be adjusted.  If you are meeting your treatment goals, this test is done at least 2 times a year.  If you are not meeting treatment goals or if your treatment goals have changed, this test is done 4 times a year.  Blood pressure test  This test is done at every routine medical visit. For most people, the goal is less than 130/80. Ask your health care provider what your goal blood pressure should be.  Dental and eye exams  Visit your dentist two times a year.  If you have type 1 diabetes, get an eye exam 3-5 years after you are diagnosed, and then once a year after your first exam. ? If you were diagnosed with type 1 diabetes as a child, get an eye exam when you are age 55 or older and have had diabetes for 3-5 years. After the first exam, you should get an eye exam once a year.  If you have type 2 diabetes, have an eye exam as soon as you are diagnosed, and then once a year after your first exam.  Foot care exam  Visual foot exams are done at every  routine medical visit. The exams check for cuts, bruises, redness, blisters, sores, or other problems with the feet.  A complete foot exam is done by your health care provider once a year. This exam includes an inspection of the structure and skin of your feet, and a check of the pulses and sensation in your feet. ? Type 1 diabetes: Get your first exam 3-5 years after diagnosis. ? Type 2 diabetes: Get your first exam as soon as you are diagnosed.  Check your feet every day for cuts, bruises, redness, blisters, or sores. If you have any of these or other problems that are not healing, contact your health care provider.  Kidney function test (urine microalbumin)  This test is done once a year. ? Type 1 diabetes: Get your first test 5 years after diagnosis. ? Type 2 diabetes: Get your first test as soon as you are diagnosed._  If you have chronic kidney disease (CKD), get a serum creatinine and estimated glomerular filtration rate (eGFR) test once a year.  Lipid profile (cholesterol, HDL, LDL, triglycerides)  This test should be done when you are diagnosed with diabetes, and every 5 years after the first test. If you are on medicines to lower your cholesterol, you may need to get this test done every year. ? The goal for LDL is less than 100 mg/dL (5.5 mmol/L). If you are at high risk, the goal is less  than 70 mg/dL (3.9 mmol/L). ? The goal for HDL is 40 mg/dL (2.2 mmol/L) for men and 50 mg/dL(2.8 mmol/L) for women. An HDL cholesterol of 60 mg/dL (3.3 mmol/L) or higher gives some protection against heart disease. ? The goal for triglycerides is less than 150 mg/dL (8.3 mmol/L).  Immunizations  The yearly flu (influenza) vaccine is recommended for everyone 6 months or older who has diabetes.  The pneumonia (pneumococcal) vaccine is recommended for everyone 2 years or older who has diabetes. If you are 77 or older, you may get the pneumonia vaccine as a series of two separate shots.  The  hepatitis B vaccine is recommended for adults shortly after they have been diagnosed with diabetes.  The Tdap (tetanus, diphtheria, and pertussis) vaccine should be given: ? According to normal childhood vaccination schedules, for children. ? Every 10 years, for adults who have diabetes.  The shingles vaccine is recommended for people who have had chicken pox and are 50 years or older.  Mental and emotional health  Screening for symptoms of eating disorders, anxiety, and depression is recommended at the time of diagnosis and afterward as needed. If your screening shows that you have symptoms (you have a positive screening result), you may need further evaluation and be referred to a mental health care provider.  Diabetes self-management education  Education about how to manage your diabetes is recommended at diagnosis and ongoing as needed.  Treatment plan  Your treatment plan will be reviewed at every medical visit.  Summary  Managing diabetes (diabetes mellitus) can be complicated. Your diabetes treatment may be managed by a team of health care providers.  Your health care providers follow a schedule in order to help you get the best quality of care.  Standards of care including having regular physical exams, blood tests, blood pressure monitoring, immunizations, screening tests, and education about how to manage your diabetes.  Your health care providers may also give you more specific instructions based on your individual health.      Type 2 Diabetes Mellitus, Self Care, Adult Caring for yourself after you have been diagnosed with type 2 diabetes (type 2 diabetes mellitus) means keeping your blood sugar (glucose) under control with a balance of:  Nutrition.  Exercise.  Lifestyle changes.  Medicines or insulin, if necessary.  Support from your team of health care providers and others.  The following information explains what you need to know to manage your diabetes  at home. What do I need to do to manage my blood glucose?  Check your blood glucose every day, as often as told by your health care provider.  Contact your health care provider if your blood glucose is above your target for 2 tests in a row.  Have your A1c (hemoglobin A1c) level checked at least two times a year, or as often as told by your health care provider. Your health care provider will set individualized treatment goals for you. Generally, the goal of treatment is to maintain the following blood glucose levels:  Before meals (preprandial): 80-130 mg/dL (4.4-7.2 mmol/L).  After meals (postprandial): below 180 mg/dL (10 mmol/L).  A1c level: less than 7%.  What do I need to know about hyperglycemia and hypoglycemia? What is hyperglycemia? Hyperglycemia, also called high blood glucose, occurs when blood glucose is too high.Make sure you know the early signs of hyperglycemia, such as:  Increased thirst.  Hunger.  Feeling very tired.  Needing to urinate more often than usual.  Blurry vision.  What is hypoglycemia? Hypoglycemia, also called low blood glucose, occurswith a blood glucose level at or below 70 mg/dL (3.9 mmol/L). The risk for hypoglycemia increases during or after exercise, during sleep, during illness, and when skipping meals or not eating for a long time (fasting). It is important to know the symptoms of hypoglycemia and treat it right away. Always have a 15-gram rapid-acting carbohydrate snack with you to treat low blood glucose. Family members and close friends should also know the symptoms and should understand how to treat hypoglycemia, in case you are not able to treat yourself. What are the symptoms of hypoglycemia? Hypoglycemia symptoms can include:  Hunger.  Anxiety.  Sweating and feeling clammy.  Confusion.  Dizziness or feeling light-headed.  Sleepiness.  Nausea.  Increased heart rate.  Headache.  Blurry  vision.  Seizure.  Nightmares.  Tingling or numbness around the mouth, lips, or tongue.  A change in speech.  Decreased ability to concentrate.  A change in coordination.  Restless sleep.  Tremors or shakes.  Fainting.  Irritability.  How do I treat hypoglycemia?  If you are alert and able to swallow safely, follow the 15:15 rule:  Take 15 grams of a rapid-acting carbohydrate. Rapid-acting options include: ? 1 tube of glucose gel. ? 3 glucose pills. ? 6-8 pieces of hard candy. ? 4 oz (120 mL) of fruit juice. ? 4 oz (120 mL) of regular (not diet) soda.  Check your blood glucose 15 minutes after you take the carbohydrate.  If the repeat blood glucose level is still at or below 70 mg/dL (3.9 mmol/L), take 15 grams of a carbohydrate again.  If your blood glucose level does not increase above 70 mg/dL (3.9 mmol/L) after 3 tries, seek emergency medical care.  After your blood glucose level returns to normal, eat a meal or a snack within 1 hour.  How do I treat severe hypoglycemia? Severe hypoglycemia is when your blood glucose level is at or below 54 mg/dL (3 mmol/L). Severe hypoglycemia is an emergency. Do not wait to see if the symptoms will go away. Get medical help right away. Call your local emergency services (911 in the [place] Do not drive yourself to the hospital. If you have severe hypoglycemia and you cannot eat or drink, you may need an injection of glucagon. A family member or close friend should learn how to check your blood glucose and how to give you a glucagon injection. Ask your health care provider if you need to have an emergency glucagon injection kit available. Severe hypoglycemia may need to be treated in a hospital. The treatment may include getting glucose through an IV tube. You may also need treatment for the cause of your hypoglycemia. Can having diabetes put me at risk for other conditions? Having diabetes can put you at risk for other long-term  (chronic) conditions, such as heart disease and kidney disease. Your health care provider may prescribe medicines to help prevent complications from diabetes. These medicines may include:  Aspirin.  Medicine to lower cholesterol.  Medicine to control blood pressure.  What else can I do to manage my diabetes? Take your diabetes medicines as told  If your health care provider prescribed insulin or diabetes medicines, take them every day.  Do not run out of insulin or other diabetes medicines that you take. Plan ahead so you always have these available.  If you use insulin, adjust your dosage based on how physically active you are and what foods you eat. Your  health care provider will tell you how to adjust your dosage. Make healthy food choices  The things that you eat and drink affect your blood glucose and your insulin dosage. Making good choices helps to control your diabetes and prevent other health problems. A healthy meal plan includes eating lean proteins, complex carbohydrates, fresh fruits and vegetables, low-fat dairy products, and healthy fats. Make an appointment to see a diet and nutrition specialist (registered dietitian) to help you create an eating plan that is right for you. Make sure that you:  Follow instructions from your health care provider about eating or drinking restrictions.  Drink enough fluid to keep your urine clear or pale yellow.  Eat healthy snacks between nutritious meals.  Track the carbohydrates that you eat. Do this by reading food labels and learning the standard serving sizes of foods.  Follow your sick day plan whenever you cannot eat or drink as usual. Make this plan in advance with your health care provider.  Stay active  Exercise regularly, as told by your health care provider. This may include:  Stretching and doing strength exercises, such as yoga or weightlifting, at least 2 times a week.  Doing at least 150 minutes of moderate-intensity  or vigorous-intensity exercise each week. This could be brisk walking, biking, or water aerobics. ? Spread out your activity over at least 3 days of the week. ? Do not go more than 2 days in a row without doing some kind of physical activity.  When you start a new exercise or activity, work with your health care provider to adjust your insulin, medicines, or food intake as needed. Make healthy lifestyle choices  Do not use any tobacco products, such as cigarettes, chewing tobacco, and e-cigarettes. If you need help quitting, ask your health care provider.  If your health care provider says that alcohol is safe for you, limit alcohol intake to no more than 1 drink per day for nonpregnant women and 2 drinks per day for men. One drink equals 12 oz of beer, 5 oz of wine, or 1 oz of hard liquor.  Learn to manage stress. If you need help with this, ask your health care provider. Care for your body   Keep your immunizations up to date. In addition to getting vaccinations as told by your health care provider, it is recommended that you get vaccinated against the following illnesses: ? The flu (influenza). Get a flu shot every year. ? Pneumonia. ? Hepatitis B.  Schedule an eye exam soon after your diagnosis, and then one time every year after that.  Check your skin and feet every day for cuts, bruises, redness, blisters, or sores. Schedule a foot exam with your health care provider once every year.  Brush your teeth and gums two times a day, and floss at least one time a day. Visit your dentist at least once every 6 months.  Maintain a healthy weight. General instructions  Take over-the-counter and prescription medicines only as told by your health care provider.  Share your diabetes management plan with people in your workplace, school, and household.  Check your urine for ketones when you are ill and as told by your health care provider.  Ask your health care provider: ? Do I need to  meet with a diabetes educator? ? Where can I find a support group for people with diabetes?  Carry a medical alert card or wear medical alert jewelry.  Keep all follow-up visits as told by your health  care provider. This is important. Where to find more information: For more information about diabetes, visit:  American Diabetes Association (ADA): www.diabetes.org  American Association of Diabetes Educators (AADE): www.diabeteseducator.org/patient-resources  This information is not intended to replace advice given to you by your health care provider. Make sure you discuss any questions you have with your health care provider. Document Released: 12/23/2015 Document Revised: 02/06/2016 Document Reviewed: 10/04/2015 Elsevier Interactive Patient Education  2017 [employer].      Blood Glucose Monitoring, Adult Monitoring your blood sugar (glucose) helps you manage your diabetes. It also helps you and your health care provider determine how well your diabetes management plan is working. Blood glucose monitoring involves checking your blood glucose as often as directed, and keeping a record (log) of your results over time. Why should I monitor my blood glucose? Checking your blood glucose regularly can:  Help you understand how food, exercise, illnesses, and medicines affect your blood glucose.  Let you know what your blood glucose is at any time. You can quickly tell if you are having low blood glucose (hypoglycemia) or high blood glucose (hyperglycemia).  Help you and your health care provider adjust your medicines as needed.  When should I check my blood glucose? Follow instructions from your health care provider about how often to check your blood glucose.   This may depend on:  The type of diabetes you have.  How well-controlled your diabetes is.  Medicines you are taking.  If you have type 1 diabetes:  Check your blood glucose at least 2 times a day.  Also check your  blood glucose: ? Before every insulin injection. ? Before and after exercise. ? Between meals. ? 2 hours after a meal. ? Occasionally between 2:00 a.m. and 3:00 a.m., as directed. ? Before potentially dangerous tasks, like driving or using heavy machinery. ? At bedtime.  You may need to check your blood glucose more often, up to 6-10 times a day: ? If you use an insulin pump. ? If you need multiple daily injections (MDI). ? If your diabetes is not well-controlled. ? If you are ill. ? If you have a history of severe hypoglycemia. ? If you have a history of not knowing when your blood glucose is getting low (hypoglycemia unawareness).  If you have type 2 diabetes:  If you take insulin or other diabetes medicines, check your blood glucose at least 2 times a day.  If you are on intensive insulin therapy, check your blood glucose at least 4 times a day. Occasionally, you may also need to check between 2:00 a.m. and 3:00 a.m., as directed.  Also check your blood glucose: ? Before and after exercise. ? Before potentially dangerous tasks, like driving or using heavy machinery.  You may need to check your blood glucose more often if: ? Your medicine is being adjusted. ? Your diabetes is not well-controlled. ? You are ill.  What is a blood glucose log?  A blood glucose log is a record of your blood glucose readings. It helps you and your health care provider: ? Look for patterns in your blood glucose over time. ? Adjust your diabetes management plan as needed.  Every time you check your blood glucose, write down your result and notes about things that may be affecting your blood glucose, such as your diet and exercise for the day.  Most glucose meters store a record of glucose readings in the meter. Some meters allow you to download your records to  a computer. How do I check my blood glucose? Follow these steps to get accurate readings of your blood glucose: Supplies  needed   Blood glucose meter.  Test strips for your meter. Each meter has its own strips. You must use the strips that come with your meter.  A needle to prick your finger (lancet). Do not use lancets more than once.  A device that holds the lancet (lancing device).  A journal or log book to write down your results.  Procedure  Wash your hands with soap and water.  Prick the side of your finger (not the tip) with the lancet. Use a different finger each time.  Gently rub the finger until a small drop of blood appears.  Follow instructions that come with your meter for inserting the test strip, applying blood to the strip, and using your blood glucose meter.  Write down your result and any notes.  Alternative testing sites  Some meters allow you to use areas of your body other than your finger (alternative sites) to test your blood.  If you think you may have hypoglycemia, or if you have hypoglycemia unawareness, do not use alternative sites. Use your finger instead.  Alternative sites may not be as accurate as the fingers, because blood flow is slower in these areas. This means that the result you get may be delayed, and it may be different from the result that you would get from your finger.  The most common alternative sites are: ? Forearm. ? Thigh. ? Palm of the hand.  Additional tips  Always keep your supplies with you.  If you have questions or need help, all blood glucose meters have a 24-hour "hotline" number that you can call. You may also contact your health care provider.  After you use a few boxes of test strips, adjust (calibrate) your blood glucose meter by following instructions that came with your meter.    The American Diabetes Association suggests the following targets for most nonpregnant adults with diabetes.  More or less stringent glycemic goals may be appropriate for each individual.  A1C: Less than 7% A1C may also be reported as eAG: Less than  154 mg/dl Before a meal (preprandial plasma glucose): 80-130 mg/dl 1-2 hours after beginning of the meal (Postprandial plasma glucose)*: Less than 180 mg/dl  *Postprandial glucose may be targeted if A1C goals are not met despite reaching preprandial glucose goals.   GOALS in short:  The goals are for the Hgb A1C to be less than 7.0 & blood pressure to be less than 130/80.    It is recommended that all diabetics are educated on and follow a healthy diabetic diet, exercise for 30 minutes 3-4 times per week (walking, biking, swimming, or machine), monitor blood glucose readings and bring that record with you to be reviewed at your next office visit.     You should be checking fasting blood sugars- especially after you eat poorly or eat really healthy, and also check 2 hour postprandial blood sugars after largest meal of the day.    Write these down and bring in your log at each office visit.    You will need to be seen every 3 months by the provider managing your Diabetes unless told otherwise by that provider.   You will need yearly eye exams from an eye specialist and foot exams to check the nerves of your feet.  Also, your urine should be checked yearly as well to make sure excess protein  is not present.   If you are checking your blood pressure at home, please record it and bring it to your next office visit.    Follow the Dietary Approaches to Stop Hypertension (DASH) diet (3 servings of fruit and vegetables daily, whole grains, low sodium, low-fat proteins).  See below.    Lastly, when it comes to your cholesterol, the goal is to have the HDL (good cholesterol) >40, and the LDL (bad cholesterol) <100.   It is recommended that you follow a heart healthy, low saturated and trans-fat diet and exercise for 30 minutes at least 5 times a week.     (( Check out the DASH diet = 1.5 Gram Low Sodium Diet   A 1.5 gram sodium diet restricts the amount of sodium in the diet to no more than 1.5 g or  1500 mg daily.  The American Heart Association recommends Americans over the age of 56 to consume no more than 1500 mg of sodium each day to reduce the risk of developing high blood pressure.  Research also shows that limiting sodium may reduce heart attack and stroke risk.  Many foods contain sodium for flavor and sometimes as a preservative.  When the amount of sodium in a diet needs to be low, it is important to know what to look for when choosing foods and drinks.  The following includes some information and guidelines to help make it easier for you to adapt to a low sodium diet.    QUICK TIPS  Do not add salt to food.  Avoid convenience items and fast food.  Choose unsalted snack foods.  Buy lower sodium products, often labeled as "lower sodium" or "no salt added."  Check food labels to learn how much sodium is in 1 serving.  When eating at a restaurant, ask that your food be prepared with less salt or none, if possible.    READING FOOD LABELS FOR SODIUM INFORMATION  The nutrition facts label is a good place to find how much sodium is in foods. Look for products with no more than 400 mg of sodium per serving.  Remember that 1.5 g = 1500 mg.  The food label may also list foods as:  Sodium-free: Less than 5 mg in a serving.  Very low sodium: 35 mg or less in a serving.  Low-sodium: 140 mg or less in a serving.  Light in sodium: 50% less sodium in a serving. For example, if a food that usually has 300 mg of sodium is changed to become light in sodium, it will have 150 mg of sodium.  Reduced sodium: 25% less sodium in a serving. For example, if a food that usually has 400 mg of sodium is changed to reduced sodium, it will have 300 mg of sodium.    CHOOSING FOODS  Grains  Avoid: Salted crackers and snack items. Some cereals, including instant hot cereals. Bread stuffing and biscuit mixes. Seasoned rice or pasta mixes.  Choose: Unsalted snack items. Low-sodium cereals, oats, puffed wheat and  rice, shredded wheat. English muffins and bread. Pasta.  Meats  Avoid: Salted, canned, smoked, spiced, pickled meats, including fish and poultry. Bacon, ham, sausage, cold cuts, hot dogs, anchovies.  Choose: Low-sodium canned tuna and salmon. Fresh or frozen meat, poultry, and fish.  Dairy  Avoid: Processed cheese and spreads. Cottage cheese. Buttermilk and condensed milk. Regular cheese.  Choose: Milk. Low-sodium cottage cheese. Yogurt. Sour cream. Low-sodium cheese.  Fruits and Vegetables  Avoid: Regular canned  vegetables. Regular canned tomato sauce and paste. Frozen vegetables in sauces. Olives. Angie Fava. Relishes. Sauerkraut.  Choose: Low-sodium canned vegetables. Low-sodium tomato sauce and paste. Frozen or fresh vegetables. Fresh and frozen fruit.  Condiments  Avoid: Canned and packaged gravies. Worcestershire sauce. Tartar sauce. Barbecue sauce. Soy sauce. Steak sauce. Ketchup. Onion, garlic, and table salt. Meat flavorings and tenderizers.  Choose: Fresh and dried herbs and spices. Low-sodium varieties of mustard and ketchup. Lemon juice. Tabasco sauce. [place].    SAMPLE 1.5 GRAM SODIUM MEAL PLAN:   Breakfast / Sodium (mg)  1 cup low-fat milk / 143 mg  1 whole-wheat English muffin / 240 mg  1 tbs heart-healthy margarine / 153 mg  1 hard-boiled egg / 139 mg  1 small orange / 0 mg  Lunch / Sodium (mg)  1 cup raw carrots / 76 mg  2 tbs no salt added peanut butter / 5 mg  2 slices whole-wheat bread / 270 mg  1 tbs jelly / 6 mg   cup red grapes / 2 mg  Dinner / Sodium (mg)  1 cup whole-wheat pasta / 2 mg  1 cup low-sodium tomato sauce / 73 mg  3 oz lean ground beef / 57 mg  1 small side salad (1 cup raw spinach leaves,  cup cucumber,  cup yellow bell pepper) with 1 tsp olive oil and 1 tsp red wine vinegar / 25 mg  Snack / Sodium (mg)  1 container low-fat vanilla yogurt / 107 mg  3 graham cracker squares / 127 mg  Nutrient Analysis  Calories: 1745  Protein: 75 g   Carbohydrate: 237 g  Fat: 57 g  Sodium: 1425 mg  Document Released: 08/31/2005 Document Revised: 05/13/2011 Document Reviewed: 12/02/2009  ExitCare Patient Information 2012 [place].))    This information is not intended to replace advice given to you by your health care provider. Make sure you discuss any questions you have with your health care provider. Document Released: 09/03/2003 Document Revised: 03/20/2016 Document Reviewed: 02/10/2016 Elsevier Interactive Patient Education  2017 [place].       Please realize, EXERCISE IS MEDICINE!  -  American Heart Association ( AHA) guidelines for exercise : If you are in good health, without any medical conditions, you should engage in 150-300 minutes of moderate intensity aerobic activity per week.  This means you should be huffing and puffing throughout your workout.   Engaging in regular exercise will improve brain function and memory, as well as improve mood, boost immune system and help with weight management.  As well as the other, more well-known effects of exercise such as decreasing blood sugar levels, decreasing blood pressure,  and decreasing bad cholesterol levels/ increasing good cholesterol levels.     -  The AHA strongly endorses consumption of a diet that contains a variety of foods from all the food categories with an emphasis on fruits and vegetables; fat-free and low-fat dairy products; cereal and grain products; legumes and nuts; and fish, poultry, and/or extra lean meats.    Excessive food intake, especially of foods high in saturated and trans fats, sugar, and salt, should be avoided.    Adequate water intake of roughly 1/2 of your weight in pounds, should equal the ounces of water per day you should drink.  So for instance, if you're 200 pounds, that would be 100 ounces of water per day.         Mediterranean Diet  Why follow it? Research shows. . Those who  follow the Mediterranean diet have a reduced  risk of heart disease  . The diet is associated with a reduced incidence of Parkinson's and Alzheimer's diseases . People following the diet may have longer life expectancies and lower rates of chronic diseases  . The Dietary Guidelines for Americans recommends the Mediterranean diet as an eating plan to promote health and prevent disease  What Is the Mediterranean Diet?  . Healthy eating plan based on typical foods and recipes of Mediterranean-style cooking . The diet is primarily a plant based diet; these foods should make up a majority of meals   Starches - Plant based foods should make up a majority of meals - They are an important sources of vitamins, minerals, energy, antioxidants, and fiber - Choose whole grains, foods high in fiber and minimally processed items  - Typical grain sources include wheat, oats, barley, corn, brown rice, bulgar, farro, millet, polenta, couscous  - Various types of beans include chickpeas, lentils, fava beans, black beans, white beans   Fruits  Veggies - Large quantities of antioxidant rich fruits & veggies; 6 or more servings  - Vegetables can be eaten raw or lightly drizzled with oil and cooked  - Vegetables common to the traditional Mediterranean Diet include: artichokes, arugula, beets, broccoli, brussel sprouts, cabbage, carrots, celery, collard greens, cucumbers, eggplant, kale, leeks, lemons, lettuce, mushrooms, okra, onions, peas, peppers, potatoes, pumpkin, radishes, rutabaga, shallots, spinach, sweet potatoes, turnips, zucchini - Fruits common to the Mediterranean Diet include: apples, apricots, avocados, cherries, clementines, dates, figs, grapefruits, grapes, melons, nectarines, oranges, peaches, pears, pomegranates, strawberries, tangerines  Fats - Replace butter and margarine with healthy oils, such as olive oil, canola oil, and tahini  - Limit nuts to no more than a handful a day  - Nuts include walnuts, almonds, pecans, pistachios, pine nuts  -  Limit or avoid candied, honey roasted or heavily salted nuts - Olives are central to the [employer] - can be eaten whole or used in a variety of dishes   Meats Protein - Limiting red meat: no more than a few times a month - When eating red meat: choose lean cuts and keep the portion to the size of deck of cards - Eggs: approx. 0 to 4 times a week  - Fish and lean poultry: at least 2 a week  - Healthy protein sources include, chicken, [place], lean beef, lamb - Increase intake of seafood such as tuna, salmon, trout, mackerel, shrimp, scallops - Avoid or limit high fat processed meats such as sausage and bacon  Dairy - Include moderate amounts of low fat dairy products  - Focus on healthy dairy such as fat free yogurt, skim milk, low or reduced fat cheese - Limit dairy products higher in fat such as whole or 2% milk, cheese, ice cream  Alcohol - Moderate amounts of red wine is ok  - No more than 5 oz daily for women (all ages) and men older than age 38  - No more than 10 oz of wine daily for men younger than 8  Other - Limit sweets and other desserts  - Use herbs and spices instead of salt to flavor foods  - Herbs and spices common to the traditional Mediterranean Diet include: basil, bay leaves, chives, cloves, cumin, fennel, garlic, lavender, marjoram, mint, oregano, parsley, pepper, rosemary, sage, savory, sumac, tarragon, thyme   It's not just a diet, it's a lifestyle:  . The Mediterranean diet includes lifestyle factors typical of those in the  region  . Foods, drinks and meals are best eaten with others and savored . Daily physical activity is important for overall good health . This could be strenuous exercise like running and aerobics . This could also be more leisurely activities such as walking, housework, yard-work, or taking the stairs . Moderation is the key; a balanced and healthy diet accommodates most foods and drinks . Consider portion sizes and frequency of  consumption of certain foods   Meal Ideas & Options:  . Breakfast:  o Whole wheat toast or whole wheat English muffins with peanut butter & hard boiled egg o Steel cut oats topped with apples & cinnamon and skim milk  o Fresh fruit: banana, strawberries, melon, berries, peaches  o Smoothies: strawberries, bananas, greek yogurt, peanut butter o Low fat greek yogurt with blueberries and granola  o Egg white omelet with spinach and mushrooms o Breakfast couscous: whole wheat couscous, apricots, skim milk, cranberries  . Sandwiches:  o Hummus and grilled vegetables (peppers, zucchini, squash) on whole wheat bread   o Grilled chicken on whole wheat pita with lettuce, tomatoes, cucumbers or tzatziki  o Tuna salad on whole wheat bread: tuna salad made with greek yogurt, olives, red peppers, capers, green onions o Garlic rosemary lamb pita: lamb sauted with garlic, rosemary, salt & pepper; add lettuce, cucumber, greek yogurt to pita - flavor with lemon juice and black pepper  . Seafood:  o Mediterranean grilled salmon, seasoned with garlic, basil, parsley, lemon juice and black pepper o Shrimp, lemon, and spinach whole-grain pasta salad made with low fat greek yogurt  o Seared scallops with lemon orzo  o Seared tuna steaks seasoned salt, pepper, coriander topped with tomato mixture of olives, tomatoes, olive oil, minced garlic, parsley, green onions and cappers  . Meats:  o Herbed greek chicken salad with kalamata olives, cucumber, feta  o Red bell peppers stuffed with spinach, bulgur, lean ground beef (or lentils) & topped with feta   o Kebabs: skewers of chicken, tomatoes, onions, zucchini, squash  o [place] burgers: made with red onions, mint, dill, lemon juice, feta cheese topped with roasted red peppers . Vegetarian o Cucumber salad: cucumbers, artichoke hearts, celery, red onion, feta cheese, tossed in olive oil & lemon juice  o Hummus and whole grain pita points with a greek salad  (lettuce, tomato, feta, olives, cucumbers, red onion) o Lentil soup with celery, carrots made with vegetable broth, garlic, salt and pepper  o Tabouli salad: parsley, bulgur, mint, scallions, cucumbers, tomato, radishes, lemon juice, olive oil, salt and pepper.

## 2019-11-28 ENCOUNTER — Other Ambulatory Visit: Payer: Self-pay | Admitting: Family Medicine

## 2019-11-28 DIAGNOSIS — E559 Vitamin D deficiency, unspecified: Secondary | ICD-10-CM

## 2019-11-28 DIAGNOSIS — M1000 Idiopathic gout, unspecified site: Secondary | ICD-10-CM

## 2019-11-28 LAB — HEMOGLOBIN A1C
Est. average glucose Bld gHb Est-mCnc: 131 mg/dL
Hgb A1c MFr Bld: 6.2 % — ABNORMAL HIGH (ref 4.8–5.6)

## 2019-11-28 LAB — COMPREHENSIVE METABOLIC PANEL
ALT: 27 IU/L (ref 0–32)
AST: 36 IU/L (ref 0–40)
Albumin/Globulin Ratio: 2 (ref 1.2–2.2)
Albumin: 4.5 g/dL (ref 3.8–4.8)
Alkaline Phosphatase: 60 IU/L (ref 39–117)
BUN/Creatinine Ratio: 14 (ref 12–28)
BUN: 17 mg/dL (ref 8–27)
Bilirubin Total: 0.4 mg/dL (ref 0.0–1.2)
CO2: 22 mmol/L (ref 20–29)
Calcium: 10.3 mg/dL (ref 8.7–10.3)
Chloride: 104 mmol/L (ref 96–106)
Creatinine, Ser: 1.22 mg/dL — ABNORMAL HIGH (ref 0.57–1.00)
GFR calc Af Amer: 53 mL/min/{1.73_m2} — ABNORMAL LOW (ref >59)
GFR calc non Af Amer: 46 mL/min/{1.73_m2} — ABNORMAL LOW (ref >59)
Globulin, Total: 2.2 g/dL (ref 1.5–4.5)
Glucose: 118 mg/dL — ABNORMAL HIGH (ref 65–99)
Potassium: 4.6 mmol/L (ref 3.5–5.2)
Sodium: 142 mmol/L (ref 134–144)
Total Protein: 6.7 g/dL (ref 6.0–8.5)

## 2019-11-28 LAB — LIPID PANEL
Chol/HDL Ratio: 3.9 ratio (ref 0.0–4.4)
Cholesterol, Total: 140 mg/dL (ref 100–199)
HDL: 36 mg/dL — ABNORMAL LOW (ref >39)
LDL Chol Calc (NIH): 56 mg/dL (ref 0–99)
Triglycerides: 306 mg/dL — ABNORMAL HIGH (ref 0–149)
VLDL Cholesterol Cal: 48 mg/dL — ABNORMAL HIGH (ref 5–40)

## 2019-11-28 LAB — CBC WITH DIFFERENTIAL/PLATELET
Basophils Absolute: 0 10*3/uL (ref 0.0–0.2)
Basos: 0 %
EOS (ABSOLUTE): 0 10*3/uL (ref 0.0–0.4)
Eos: 0 %
Hematocrit: 38.5 % (ref 34.0–46.6)
Hemoglobin: 13.1 g/dL (ref 11.1–15.9)
Immature Grans (Abs): 0.1 10*3/uL (ref 0.0–0.1)
Immature Granulocytes: 1 %
Lymphocytes Absolute: 2.7 10*3/uL (ref 0.7–3.1)
Lymphs: 31 %
MCH: 31.5 pg (ref 26.6–33.0)
MCHC: 34 g/dL (ref 31.5–35.7)
MCV: 93 fL (ref 79–97)
Monocytes Absolute: 0.4 10*3/uL (ref 0.1–0.9)
Monocytes: 5 %
Neutrophils Absolute: 5.7 10*3/uL (ref 1.4–7.0)
Neutrophils: 63 %
Platelets: 187 10*3/uL (ref 150–450)
RBC: 4.16 x10E6/uL (ref 3.77–5.28)
RDW: 13.7 % (ref 11.7–15.4)
WBC: 8.9 10*3/uL (ref 3.4–10.8)

## 2019-11-28 LAB — VITAMIN D 25 HYDROXY (VIT D DEFICIENCY, FRACTURES): Vit D, 25-Hydroxy: 20.7 ng/mL — ABNORMAL LOW (ref 30.0–100.0)

## 2019-11-28 LAB — MICROALBUMIN / CREATININE URINE RATIO
Creatinine, Urine: 79.5 mg/dL
Microalb/Creat Ratio: 9 mg/g creat (ref 0–29)
Microalbumin, Urine: 6.9 ug/mL

## 2019-11-28 LAB — LDL CHOLESTEROL, DIRECT: LDL Direct: 63 mg/dL (ref 0–99)

## 2019-11-28 LAB — T4, FREE: Free T4: 1.24 ng/dL (ref 0.82–1.77)

## 2019-11-28 LAB — TSH: TSH: 2.54 u[IU]/mL (ref 0.450–4.500)

## 2019-11-28 MED ORDER — VITAMIN D (ERGOCALCIFEROL) 1.25 MG (50000 UNIT) PO CAPS: ORAL_CAPSULE | ORAL | 1 refills | Status: DC

## 2019-11-28 MED ORDER — ALLOPURINOL 300 MG PO TABS: 300.0000 mg | ORAL_TABLET | Freq: Two times a day (BID) | ORAL | 0 refills | Status: DC

## 2019-11-30 ENCOUNTER — Telehealth: Payer: Self-pay | Admitting: Family Medicine

## 2019-11-30 DIAGNOSIS — IMO0002 Reserved for concepts with insufficient information to code with codable children: Secondary | ICD-10-CM

## 2019-11-30 DIAGNOSIS — E1151 Type 2 diabetes mellitus with diabetic peripheral angiopathy without gangrene: Secondary | ICD-10-CM

## 2019-11-30 MED ORDER — JANUMET XR 100-1000 MG PO TB24: 1.0000 | ORAL_TABLET | Freq: Every day | ORAL | 1 refills | Status: DC

## 2019-11-30 NOTE — Telephone Encounter (Signed)
Per patient we were waiting on her lab results before Rxs could be refilled---  Pt called to see if results are in--  ---- Forwarding message to med asst for review w/ patient & to confirm if Rx can be called into pharmacy.  --glh

## 2019-11-30 NOTE — Telephone Encounter (Signed)
Dear Rachel Black,    I'm happy to report that most of your labs that we recently obtained are essentially within normal limits or are stable and unchanged from prior.  There is no need for a change in our treatment plan at this time except that you will need to take a once weekly vit D supplement.   I sent this into you pharmacy along with a refill of you allopurinol as requested.      Please feel free to contact the office if you have any further questions or concerns.     Please also follow up at the office as discussed last office visit.    Be well,  Dr. Raliegh Scarlet   Patient is aware of the results and verbalized understanding. AS, CMA

## 2019-12-05 ENCOUNTER — Telehealth: Payer: Self-pay | Admitting: Physician Assistant

## 2019-12-05 ENCOUNTER — Other Ambulatory Visit: Payer: Self-pay | Admitting: Physician Assistant

## 2019-12-05 ENCOUNTER — Other Ambulatory Visit: Payer: Self-pay | Admitting: Family Medicine

## 2019-12-05 DIAGNOSIS — E785 Hyperlipidemia, unspecified: Secondary | ICD-10-CM

## 2019-12-05 NOTE — Telephone Encounter (Signed)
Pt c/o medication issue:  1. Name of Medication: isosorbide mononitrate (IMDUR) 30 MG 24 hr tablet  2. How are you currently taking this medication (dosage and times per day)? As directed  3. Are you having a reaction (difficulty breathing--STAT)?   4. What is your medication issue? Patient wanted to know if she still needed to take this medication.   The patient is almost out of the medication, so the pharmacy advised her to call the Dr. For clarification. Please advise

## 2019-12-05 NOTE — Telephone Encounter (Signed)
Weve never prescribed this medication for patient. Please review and authorize if deemed appropriate. AS, CMA

## 2019-12-05 NOTE — Telephone Encounter (Signed)
Spoke to patient - it has been about year since last visit -   Appointment is needed to decide  On if medication is needed.  also patient  States she will possible be having cataract  Surgery.  Rn informed patient  Possible   Eye doctor may need clearance since she sees a cardiologist prior to surgery . RN recommend patient to make an appointment.  Patient opted to make an appointment to have both  Question answered . Appointment available tomorrow.  Patient verbalized understanding.

## 2019-12-06 ENCOUNTER — Ambulatory Visit: Payer: BC Managed Care – PPO | Admitting: Adult Health

## 2019-12-06 ENCOUNTER — Encounter: Payer: Self-pay | Admitting: Adult Health

## 2019-12-06 ENCOUNTER — Other Ambulatory Visit: Payer: Self-pay

## 2019-12-06 VITALS — BP 124/72 | HR 65 | Ht 66.0 in | Wt 242.8 lb

## 2019-12-06 DIAGNOSIS — E785 Hyperlipidemia, unspecified: Secondary | ICD-10-CM | POA: Diagnosis not present

## 2019-12-06 DIAGNOSIS — I10 Essential (primary) hypertension: Secondary | ICD-10-CM | POA: Diagnosis not present

## 2019-12-06 DIAGNOSIS — I639 Cerebral infarction, unspecified: Secondary | ICD-10-CM | POA: Diagnosis not present

## 2019-12-06 DIAGNOSIS — E1169 Type 2 diabetes mellitus with other specified complication: Secondary | ICD-10-CM

## 2019-12-06 DIAGNOSIS — Q211 Atrial septal defect: Secondary | ICD-10-CM

## 2019-12-06 DIAGNOSIS — E782 Mixed hyperlipidemia: Secondary | ICD-10-CM

## 2019-12-06 DIAGNOSIS — I214 Non-ST elevation (NSTEMI) myocardial infarction: Secondary | ICD-10-CM

## 2019-12-06 DIAGNOSIS — Q2112 Patent foramen ovale: Secondary | ICD-10-CM

## 2019-12-06 MED ORDER — ROSUVASTATIN CALCIUM 40 MG PO TABS: 40.0000 mg | ORAL_TABLET | Freq: Every day | ORAL | 3 refills | Status: DC

## 2019-12-06 MED ORDER — ISOSORBIDE MONONITRATE ER 30 MG PO TB24: 30.0000 mg | ORAL_TABLET | Freq: Every day | ORAL | 3 refills | Status: DC

## 2019-12-06 NOTE — Patient Instructions (Signed)
Medication Instructions:  Continue current medications  *If you need a refill on your cardiac medications before your next appointment, please call your pharmacy*   Lab Work: None Ordered  Testing/Procedures: None Ordered   Follow-Up: At [facility], you and your health needs are our priority.  As part of our continuing mission to provide you with exceptional heart care, we have created designated Provider Care Teams.  These Care Teams include your primary Cardiologist (physician) and Advanced Practice Providers (APPs -  Physician Assistants and Nurse Practitioners) who all work together to provide you with the care you need, when you need it.  We recommend signing up for the patient portal called "MyChart".  Sign up information is provided on this After Visit Summary.  MyChart is used to connect with patients for Virtual Visits (Telemedicine).  Patients are able to view lab/test results, encounter notes, upcoming appointments, etc.  Non-urgent messages can be sent to your provider as well.   To learn more about what you can do with MyChart, go to [email or web address].    Your next appointment:   1 year(s)  The format for your next appointment:   In Person  Provider:   You may see Minus Breeding, MD or one of the following Advanced Practice Providers on your designated Care Team:    Rosaria Ferries, PA-C  Jory Sims, DNP, ANP  Cadence Kathlen Mody, NP

## 2019-12-06 NOTE — Progress Notes (Signed)
Cardiology Office Note   Date:  12/06/2019   ID:  Fredricka, Kohrs 05-18-1951, MRN [id number]  PCP:  Mellody Dance, DO  Cardiologist: Dr. Percival Spanish CC: Follow Up    History of Present Illness: Rachel Black is a 69 y.o. female who presents for ongoing assessment and management of non-STEMI which occurred on 10/2017 medical management for 100% OM 2, cryptogenic CVA on 03/2017 status post ILR with positive PFO on TEE, hypertension, hyperlipidemia, OSA, diabetes, GERD, and gout.  02/2018 no atrial fib on ILR.    She was last seen via televisit on 12/21/2018, by Rosaria Ferries, PA.  At that time she was not having any cardiac complaints and had stayed quarantined not leaving her house for weeks at a time in the setting of Covid pandemic  She was to continue her current medication regimen as she was not having any cardiac symptoms, she was compliant with this regimen.  She is here for 1 year follow-up.   She comes today also being prepared for surgery for glaucoma by [facility].  She denies any complaints of chest pain, she does have some dyspnea on exertion related to morbid obesity, she also has some bilateral knee pain.  She is not very active.  She is denying any rapid heart rhythm, palpitations, or dizziness.  She has recently been seen by her PCP and has had labs drawn on 11/27/2019 all of which were within normal limits.  Past Medical History:  Diagnosis Date  . Arthritis    PAIN AND OA BOTH KNEES AND SHOULDERS AND ELBOWS AND WRIST  . Blood transfusion without reported diagnosis 2018   after cholecystectomy  . Diabetes mellitus    ORAL MEDICATION  . GERD (gastroesophageal reflux disease)   . Gout    NO RECENT FLARE UPS  . H/O: rheumatic fever    AS A CHILD - NO KNOWN HEART MURMUR OR HEART PROBLEMS  . Heart attack ([place])   . Hyperlipidemia   . Hypertension   . Stroke [facility]) 03/2017    Past Surgical History:  Procedure Laterality Date  . ABDOMINAL HYSTERECTOMY     . CESAREAN SECTION    . CHOLECYSTECTOMY N/A 12/09/2016   Procedure: LAPAROSCOPIC CHOLECYSTECTOMY WITH INTRAOPERATIVE CHOLANGIOGRAM;  Surgeon: Stark Klein, MD;  Location: [facility];  Service: General;  Laterality: N/A;  . COLONOSCOPY WITH PROPOFOL N/A 01/30/2013   Procedure: COLONOSCOPY WITH PROPOFOL;  Surgeon: Irene Shipper, MD;  Location: [facility];  Service: Endoscopy;  Laterality: N/A;  . ERCP N/A 12/08/2016   Procedure: ENDOSCOPIC RETROGRADE CHOLANGIOPANCREATOGRAPHY (ERCP);  Surgeon: Ladene Artist, MD;  Location: [facility];  Service: Endoscopy;  Laterality: N/A;  . ESOPHAGOGASTRODUODENOSCOPY (EGD) WITH PROPOFOL N/A 01/30/2013   Procedure: ESOPHAGOGASTRODUODENOSCOPY (EGD) WITH PROPOFOL;  Surgeon: Irene Shipper, MD;  Location: [facility];  Service: Endoscopy;  Laterality: N/A;  . LEFT HEART CATH AND CORONARY ANGIOGRAPHY N/A 10/18/2017   Procedure: LEFT HEART CATH AND CORONARY ANGIOGRAPHY;  Surgeon: Martinique, Peter M, MD;  Location: [facility];  Service: Cardiovascular;  Laterality: N/A;  . LOOP RECORDER INSERTION N/A 03/18/2017   Procedure: Loop Recorder Insertion;  Surgeon: Constance Haw, MD;  Location: [facility];  Service: Cardiovascular;  Laterality: N/A;  . PARTIAL HYSTERECTOMY    . TEE WITHOUT CARDIOVERSION N/A 03/16/2017   Procedure: TRANSESOPHAGEAL ECHOCARDIOGRAM (TEE);  Surgeon: Acie Fredrickson Wonda Cheng, MD;  Location: [facility];  Service: Cardiovascular;  Laterality: N/A;  . TOTAL KNEE ARTHROPLASTY Right 04/20/2014  Procedure: RIGHT TOTAL KNEE ARTHROPLASTY CONVERTED TO RIGHT KNEE REIMPLANTATION;  Surgeon: Mcarthur Rossetti, MD;  Location: [facility];  Service: Orthopedics;  Laterality: Right;  . TOTAL KNEE ARTHROPLASTY Left 08/23/2015   Procedure: LEFT TOTAL KNEE ARTHROPLASTY;  Surgeon: Mcarthur Rossetti, MD;  Location: [facility];  Service: Orthopedics;  Laterality: Left;  Marland Kitchen VENTRAL HERNIA REPAIR     x2     Current Outpatient Medications  Medication Sig Dispense  Refill  . ACCU-CHEK SOFTCLIX LANCETS lancets Use as instructed once a day.  DX Code: E11.9 100 each 1  . allopurinol (ZYLOPRIM) 300 MG tablet Take 1 tablet (300 mg total) by mouth 2 (two) times daily. Needs ov/follow up 180 tablet 0  . aspirin 81 MG EC tablet Take 1 tablet (81 mg total) by mouth daily.    . Calcium Carb-Cholecalciferol (CALCIUM 600-D PO) Take 1 tablet by mouth 2 (two) times daily before a meal.    . DEXILANT 60 MG capsule TAKE 1 CAPSULE BY MOUTH TWICE A DAY 180 capsule 2  . Emollient (UDDERLY SMOOTH) CREA Apply 1 application topically See admin instructions. Apply topically to skin folds as needed for rash/irritation    . Famotidine (PEPCID PO) Take by mouth daily as needed.    . fenofibrate 160 MG tablet Take 1 tablet (160 mg total) by mouth daily. 90 tablet 0  . ferrous sulfate 325 (65 FE) MG tablet Take 1 tablet (325 mg total) by mouth 2 (two) times daily. 180 tablet 3  . glucose blood (ACCU-CHEK AVIVA PLUS) test strip Use as instructed once a day.  DX Code E11.9 100 each 1  . isosorbide mononitrate (IMDUR) 30 MG 24 hr tablet Take 1 tablet (30 mg total) by mouth daily. 90 tablet 3  . methocarbamol (ROBAXIN) 500 MG tablet TAKE 1 TABLET (500 MG TOTAL) BY MOUTH EVERY 8 (EIGHT) HOURS AS NEEDED FOR MUSCLE SPASMS. 60 tablet 0  . nitroGLYCERIN (NITROSTAT) 0.4 MG SL tablet Place 1 tablet (0.4 mg total) under the tongue every 5 (five) minutes x 3 doses as needed for chest pain. 25 tablet 2  . olmesartan (BENICAR) 5 MG tablet TAKE 2 TABLETS BY MOUTH EVERY DAY 180 tablet 1  . rosuvastatin (CRESTOR) 40 MG tablet Take 1 tablet (40 mg total) by mouth daily. 90 tablet 3  . SitaGLIPtin-MetFORMIN HCl (JANUMET XR) 906 389 9921 MG TB24 Take 1 tablet by mouth daily. 90 tablet 1  . Vitamin D, Ergocalciferol, (DRISDOL) 1.25 MG (50000 UNIT) CAPS capsule Take one tablet wkly 12 capsule 1   No current facility-administered medications for this visit.    Allergies:   Other and Penicillins    Social  History:  The patient  reports that she has never smoked. She has never used smokeless tobacco. She reports that she does not drink alcohol or use drugs.   Family History:  The patient's family history includes Breast cancer in her mother; Crohn's disease in her son; Diabetes in her father and mother; Heart disease in her father; Hyperlipidemia in her father; Hypertension in her mother; Irritable bowel syndrome in her son; Stroke in her father.    ROS: All other systems are reviewed and negative. Unless otherwise mentioned in H&P    PHYSICAL EXAM: VS:  BP 124/72   Pulse 65   Ht 5' 6"  (1.676 m)   Wt 242 lb 12.8 oz (110.1 kg)   BMI 39.19 kg/m  , BMI Body mass index is 39.19 kg/m. GEN: Well nourished, well developed, in no  acute distress HEENT: normal Neck: no JVD, carotid bruits, or masses Cardiac:RRR; no murmurs, rubs, or gallops,no edema  Respiratory:  Clear to auscultation bilaterally, normal work of breathing GI: soft, nontender, nondistended, + BS MS: no deformity or atrophy Skin: warm and dry, no rash Neuro:  Strength and sensation are intact Psych: euthymic mood, full affect   EKG:, Personally reviewed) normal sinus rhythm with left anterior fascicular block possible anterior infarct, rate of 65 bpm.  Compared to prior EKG in 2019 there is no change.   Recent Labs: 11/27/2019: ALT 27; BUN 17; Creatinine, Ser 1.22; Hemoglobin 13.1; Platelets 187; Potassium 4.6; Sodium 142; TSH 2.540    Lipid Panel    Component Value Date/Time   CHOL 140 11/27/2019 1428   TRIG 306 (H) 11/27/2019 1428   HDL 36 (L) 11/27/2019 1428   CHOLHDL 3.9 11/27/2019 1428   CHOLHDL 4 12/21/2017 1044   VLDL 59.8 (H) 12/21/2017 1044   LDLCALC 56 11/27/2019 1428   LDLDIRECT 63 11/27/2019 1428   LDLDIRECT 50.0 12/21/2017 1044      Wt Readings from Last 3 Encounters:  12/06/19 242 lb 12.8 oz (110.1 kg)  11/27/19 249 lb 1.6 oz (113 kg)  12/21/18 253 lb 8 oz (115 kg)      Other studies Reviewed:  Echocardiogram 10/17/2017 Left ventricle: The cavity size was normal. There was mild  concentric hypertrophy. Systolic function was normal. The  estimated ejection fraction was in the range of 55% to 60%. Wall  motion was normal; there were no regional wall motion  abnormalities. Doppler parameters are consistent with abnormal  left ventricular relaxation (grade 1 diastolic dysfunction).  - Aortic valve: Transvalvular velocity was within the normal range.  There was no stenosis. There was no regurgitation.  - Mitral valve: Transvalvular velocity was within the normal range.  There was no evidence for stenosis. There was no regurgitation.  - Right ventricle: The cavity size was normal. Wall thickness was  normal. Systolic function was normal.  - Atrial septum: No defect or patent foramen ovale was identified  by color flow Doppler.  - Tricuspid valve: There was mild regurgitation.  - Pulmonary arteries: Systolic pressure was within the normal  range. PA peak pressure: 21 mm Hg (S).    ASSESSMENT AND PLAN:  1.Pre-Operative Evaluation:   Chart reviewed as part of pre-operative protocol coverage. Given past medical history and time since last visit, based on ACC/AHA guidelines, ATASHA COLEBANK would be at acceptable risk for the planned procedure without further cardiovascular testing.  This message will be sent to [facility].  2.  History of non-STEMI in 2019: Continue medical management with statin isosorbide and aspirin.  She is given refills on isosorbide and rosuvastatin today.  No planned ischemic testing as she is asymptomatic  3.  ILR in situ: She was positive for PFO on TEE.  Continue remote checks as per protocol.  4.  Hypertension: Excellent control of blood pressure currently.  No changes in regimen.  She will continue olmesartan 10 mg daily as directed.  5.  Hyperlipidemia: Goal of LDL less than 70.  She is currently on atorvastatin 40 mg daily.   Recent labs by PCP LDL 56.   Current medicines are reviewed at length with the patient today.  I have spent 30 minutes dedicated to the care of this patient on the date of this encounter to include pre-visit review of records, assessment, management and diagnostic testing,with shared decision making.  Labs/ tests ordered  today include: None.None   Phill Myron. West Pugh, ANP, [facility]   12/06/2019 10:43 AM    [facility] [place] [address] Office [phone] Fax [phone]  Notice: This dictation was prepared with Dragon dictation along with smaller phrase technology. Any transcriptional errors that result from this process are unintentional and may not be corrected upon review.

## 2019-12-08 DIAGNOSIS — Z01818 Encounter for other preprocedural examination: Secondary | ICD-10-CM | POA: Diagnosis not present

## 2019-12-08 DIAGNOSIS — H35371 Puckering of macula, right eye: Secondary | ICD-10-CM | POA: Diagnosis not present

## 2019-12-08 DIAGNOSIS — H25812 Combined forms of age-related cataract, left eye: Secondary | ICD-10-CM | POA: Diagnosis not present

## 2019-12-10 LAB — CUP PACEART REMOTE DEVICE CHECK
Date Time Interrogation Session: 20210328022528
Implantable Pulse Generator Implant Date: 20180705

## 2019-12-11 ENCOUNTER — Ambulatory Visit (INDEPENDENT_AMBULATORY_CARE_PROVIDER_SITE_OTHER): Payer: BC Managed Care – PPO | Admitting: *Deleted

## 2019-12-11 DIAGNOSIS — I63411 Cerebral infarction due to embolism of right middle cerebral artery: Secondary | ICD-10-CM | POA: Diagnosis not present

## 2019-12-11 NOTE — Progress Notes (Signed)
ILR Remote 

## 2019-12-21 DIAGNOSIS — H2512 Age-related nuclear cataract, left eye: Secondary | ICD-10-CM | POA: Diagnosis not present

## 2019-12-21 DIAGNOSIS — H25812 Combined forms of age-related cataract, left eye: Secondary | ICD-10-CM | POA: Diagnosis not present

## 2020-01-11 ENCOUNTER — Other Ambulatory Visit: Payer: Self-pay | Admitting: Family Medicine

## 2020-01-11 DIAGNOSIS — I10 Essential (primary) hypertension: Secondary | ICD-10-CM

## 2020-01-11 DIAGNOSIS — H2511 Age-related nuclear cataract, right eye: Secondary | ICD-10-CM | POA: Diagnosis not present

## 2020-01-11 LAB — CUP PACEART REMOTE DEVICE CHECK
Date Time Interrogation Session: 20210428232030
Implantable Pulse Generator Implant Date: 20180705

## 2020-01-11 NOTE — Telephone Encounter (Signed)
No longer a [facility] patient.

## 2020-01-15 ENCOUNTER — Ambulatory Visit (INDEPENDENT_AMBULATORY_CARE_PROVIDER_SITE_OTHER): Payer: BC Managed Care – PPO | Admitting: *Deleted

## 2020-01-15 DIAGNOSIS — I63411 Cerebral infarction due to embolism of right middle cerebral artery: Secondary | ICD-10-CM

## 2020-01-16 NOTE — Progress Notes (Signed)
Carelink Summary Report / Loop Recorder 

## 2020-01-17 ENCOUNTER — Other Ambulatory Visit (INDEPENDENT_AMBULATORY_CARE_PROVIDER_SITE_OTHER): Payer: Self-pay | Admitting: Orthopaedic Surgery

## 2020-01-18 NOTE — Telephone Encounter (Signed)
Please advise 

## 2020-02-05 ENCOUNTER — Other Ambulatory Visit: Payer: Self-pay | Admitting: Family Medicine

## 2020-02-05 DIAGNOSIS — M1000 Idiopathic gout, unspecified site: Secondary | ICD-10-CM

## 2020-02-13 ENCOUNTER — Ambulatory Visit (INDEPENDENT_AMBULATORY_CARE_PROVIDER_SITE_OTHER): Payer: BC Managed Care – PPO | Admitting: *Deleted

## 2020-02-13 DIAGNOSIS — I63411 Cerebral infarction due to embolism of right middle cerebral artery: Secondary | ICD-10-CM

## 2020-02-14 LAB — CUP PACEART REMOTE DEVICE CHECK
Date Time Interrogation Session: 20210529232834
Implantable Pulse Generator Implant Date: 20180705

## 2020-02-14 NOTE — Progress Notes (Signed)
Carelink Summary Report / Loop Recorder 

## 2020-02-16 NOTE — Addendum Note (Signed)
Addended by: Douglass Rivers D on: 02/16/2020 09:35 AM   Modules accepted: Level of Service

## 2020-02-20 ENCOUNTER — Other Ambulatory Visit: Payer: Self-pay | Admitting: Family Medicine

## 2020-02-20 DIAGNOSIS — E785 Hyperlipidemia, unspecified: Secondary | ICD-10-CM

## 2020-02-26 ENCOUNTER — Telehealth: Payer: Self-pay | Admitting: Physician Assistant

## 2020-02-26 DIAGNOSIS — M1000 Idiopathic gout, unspecified site: Secondary | ICD-10-CM

## 2020-02-26 MED ORDER — ALLOPURINOL 300 MG PO TABS: 300.0000 mg | ORAL_TABLET | Freq: Two times a day (BID) | ORAL | 0 refills | Status: DC

## 2020-02-26 NOTE — Addendum Note (Signed)
Addended by: Mickel Crow on: 02/26/2020 09:29 AM   Modules accepted: Orders

## 2020-02-26 NOTE — Telephone Encounter (Signed)
Refill sent to requested pharmacy. AS, CMA 

## 2020-02-26 NOTE — Telephone Encounter (Signed)
Patient is requesting a refill of her allopurinol, if approved please send to [facility] on [address]. Patient is requesting a 90 day supply per insurance payment if possible.

## 2020-02-27 ENCOUNTER — Other Ambulatory Visit: Payer: Self-pay | Admitting: Physician Assistant

## 2020-02-27 DIAGNOSIS — E785 Hyperlipidemia, unspecified: Secondary | ICD-10-CM

## 2020-02-27 MED ORDER — FENOFIBRATE 160 MG PO TABS: 160.0000 mg | ORAL_TABLET | Freq: Every day | ORAL | 0 refills | Status: DC

## 2020-02-27 NOTE — Telephone Encounter (Signed)
Refill sent to requested pharmacy. AS, CMA 

## 2020-02-27 NOTE — Telephone Encounter (Signed)
Pt's husband came by office states pt forgot to include this med in earlier refill request :   fenofibrate 160 MG tablet [[id number]]   Order Details Dose: 160 mg Route: Oral Frequency: Daily  Dispense Quantity: 90 tablet Refills: 0   Note to Pharmacy: Please do not send RF requests to Dr Mellody Dance in the future as I will be leaving the practice on May 1st 2021. It will have to be from another provider at [facility]: Take 1 tablet (160 mg total) by mouth daily.       Pt uses  : Pharmacy  [id number] [place] [id number] Phone:  [phone]:  [phone]   --glh

## 2020-03-15 ENCOUNTER — Ambulatory Visit (INDEPENDENT_AMBULATORY_CARE_PROVIDER_SITE_OTHER): Payer: BC Managed Care – PPO | Admitting: *Deleted

## 2020-03-15 DIAGNOSIS — I63411 Cerebral infarction due to embolism of right middle cerebral artery: Secondary | ICD-10-CM | POA: Diagnosis not present

## 2020-03-15 LAB — CUP PACEART REMOTE DEVICE CHECK
Date Time Interrogation Session: 20210701230950
Implantable Pulse Generator Implant Date: 20180705

## 2020-03-20 NOTE — Progress Notes (Signed)
Carelink Summary Report / Loop Recorder 

## 2020-04-22 ENCOUNTER — Other Ambulatory Visit: Payer: Self-pay | Admitting: Family Medicine

## 2020-04-22 ENCOUNTER — Ambulatory Visit (INDEPENDENT_AMBULATORY_CARE_PROVIDER_SITE_OTHER): Payer: BC Managed Care – PPO | Admitting: *Deleted

## 2020-04-22 DIAGNOSIS — I63411 Cerebral infarction due to embolism of right middle cerebral artery: Secondary | ICD-10-CM

## 2020-04-22 DIAGNOSIS — E559 Vitamin D deficiency, unspecified: Secondary | ICD-10-CM

## 2020-04-22 LAB — CUP PACEART REMOTE DEVICE CHECK
Date Time Interrogation Session: 20210803231554
Implantable Pulse Generator Implant Date: 20180705

## 2020-04-23 NOTE — Progress Notes (Signed)
Carelink Summary Report / Loop Recorder 

## 2020-04-26 ENCOUNTER — Inpatient Hospital Stay (HOSPITAL_COMMUNITY)
Admission: EM | Admit: 2020-04-26 | Discharge: 2020-04-29 | DRG: 177 | Disposition: A | Discharge status: Home Health | Payer: BC Managed Care – PPO | Attending: Family Medicine | Admitting: Family Medicine

## 2020-04-26 ENCOUNTER — Other Ambulatory Visit: Payer: Self-pay

## 2020-04-26 ENCOUNTER — Inpatient Hospital Stay (HOSPITAL_COMMUNITY): Payer: BC Managed Care – PPO

## 2020-04-26 ENCOUNTER — Encounter (HOSPITAL_COMMUNITY): Payer: Self-pay

## 2020-04-26 ENCOUNTER — Emergency Department (HOSPITAL_COMMUNITY): Payer: BC Managed Care – PPO

## 2020-04-26 DIAGNOSIS — Z79899 Other long term (current) drug therapy: Secondary | ICD-10-CM

## 2020-04-26 DIAGNOSIS — Z6837 Body mass index (BMI) 37.0-37.9, adult: Secondary | ICD-10-CM | POA: Diagnosis not present

## 2020-04-26 DIAGNOSIS — E669 Obesity, unspecified: Secondary | ICD-10-CM | POA: Diagnosis present

## 2020-04-26 DIAGNOSIS — Z8673 Personal history of transient ischemic attack (TIA), and cerebral infarction without residual deficits: Secondary | ICD-10-CM | POA: Diagnosis not present

## 2020-04-26 DIAGNOSIS — K219 Gastro-esophageal reflux disease without esophagitis: Secondary | ICD-10-CM | POA: Diagnosis present

## 2020-04-26 DIAGNOSIS — E119 Type 2 diabetes mellitus without complications: Secondary | ICD-10-CM | POA: Diagnosis present

## 2020-04-26 DIAGNOSIS — Z96653 Presence of artificial knee joint, bilateral: Secondary | ICD-10-CM | POA: Diagnosis present

## 2020-04-26 DIAGNOSIS — I252 Old myocardial infarction: Secondary | ICD-10-CM | POA: Diagnosis not present

## 2020-04-26 DIAGNOSIS — Z7982 Long term (current) use of aspirin: Secondary | ICD-10-CM | POA: Diagnosis not present

## 2020-04-26 DIAGNOSIS — R0902 Hypoxemia: Secondary | ICD-10-CM

## 2020-04-26 DIAGNOSIS — I959 Hypotension, unspecified: Secondary | ICD-10-CM | POA: Diagnosis present

## 2020-04-26 DIAGNOSIS — I10 Essential (primary) hypertension: Secondary | ICD-10-CM | POA: Diagnosis present

## 2020-04-26 DIAGNOSIS — N179 Acute kidney failure, unspecified: Secondary | ICD-10-CM | POA: Diagnosis present

## 2020-04-26 DIAGNOSIS — E861 Hypovolemia: Secondary | ICD-10-CM | POA: Diagnosis present

## 2020-04-26 DIAGNOSIS — Z7984 Long term (current) use of oral hypoglycemic drugs: Secondary | ICD-10-CM

## 2020-04-26 DIAGNOSIS — M109 Gout, unspecified: Secondary | ICD-10-CM | POA: Diagnosis present

## 2020-04-26 DIAGNOSIS — I2510 Atherosclerotic heart disease of native coronary artery without angina pectoris: Secondary | ICD-10-CM | POA: Diagnosis present

## 2020-04-26 DIAGNOSIS — D6959 Other secondary thrombocytopenia: Secondary | ICD-10-CM | POA: Diagnosis present

## 2020-04-26 DIAGNOSIS — U071 COVID-19: Secondary | ICD-10-CM | POA: Diagnosis present

## 2020-04-26 DIAGNOSIS — J1282 Pneumonia due to coronavirus disease 2019: Secondary | ICD-10-CM | POA: Diagnosis present

## 2020-04-26 DIAGNOSIS — E782 Mixed hyperlipidemia: Secondary | ICD-10-CM | POA: Diagnosis present

## 2020-04-26 DIAGNOSIS — Z90710 Acquired absence of both cervix and uterus: Secondary | ICD-10-CM | POA: Diagnosis not present

## 2020-04-26 DIAGNOSIS — R7989 Other specified abnormal findings of blood chemistry: Secondary | ICD-10-CM | POA: Diagnosis not present

## 2020-04-26 LAB — CBC WITH DIFFERENTIAL/PLATELET
Abs Immature Granulocytes: 0.05 10*3/uL (ref 0.00–0.07)
Basophils Absolute: 0 10*3/uL (ref 0.0–0.1)
Basophils Relative: 0 %
Eosinophils Absolute: 0 10*3/uL (ref 0.0–0.5)
Eosinophils Relative: 0 %
HCT: 31.8 % — ABNORMAL LOW (ref 36.0–46.0)
Hemoglobin: 10.2 g/dL — ABNORMAL LOW (ref 12.0–15.0)
Immature Granulocytes: 1 %
Lymphocytes Relative: 28 %
Lymphs Abs: 1.7 10*3/uL (ref 0.7–4.0)
MCH: 31.3 pg (ref 26.0–34.0)
MCHC: 32.1 g/dL (ref 30.0–36.0)
MCV: 97.5 fL (ref 80.0–100.0)
Monocytes Absolute: 0.3 10*3/uL (ref 0.1–1.0)
Monocytes Relative: 5 %
Neutro Abs: 3.8 10*3/uL (ref 1.7–7.7)
Neutrophils Relative %: 66 %
Platelets: 130 10*3/uL — ABNORMAL LOW (ref 150–400)
RBC: 3.26 MIL/uL — ABNORMAL LOW (ref 3.87–5.11)
RDW: 13.4 % (ref 11.5–15.5)
WBC: 5.9 10*3/uL (ref 4.0–10.5)
nRBC: 0 % (ref 0.0–0.2)

## 2020-04-26 LAB — COMPREHENSIVE METABOLIC PANEL
ALT: 19 U/L (ref 0–44)
AST: 42 U/L — ABNORMAL HIGH (ref 15–41)
Albumin: 3.4 g/dL — ABNORMAL LOW (ref 3.5–5.0)
Alkaline Phosphatase: 36 U/L — ABNORMAL LOW (ref 38–126)
Anion gap: 14 (ref 5–15)
BUN: 56 mg/dL — ABNORMAL HIGH (ref 8–23)
CO2: 21 mmol/L — ABNORMAL LOW (ref 22–32)
Calcium: 9 mg/dL (ref 8.9–10.3)
Chloride: 102 mmol/L (ref 98–111)
Creatinine, Ser: 2.66 mg/dL — ABNORMAL HIGH (ref 0.44–1.00)
GFR calc Af Amer: 21 mL/min — ABNORMAL LOW (ref >60)
GFR calc non Af Amer: 18 mL/min — ABNORMAL LOW (ref >60)
Glucose, Bld: 109 mg/dL — ABNORMAL HIGH (ref 70–99)
Potassium: 4.2 mmol/L (ref 3.5–5.1)
Sodium: 137 mmol/L (ref 135–145)
Total Bilirubin: 0.7 mg/dL (ref 0.3–1.2)
Total Protein: 6.2 g/dL — ABNORMAL LOW (ref 6.5–8.1)

## 2020-04-26 LAB — C-REACTIVE PROTEIN: CRP: 7 mg/dL — ABNORMAL HIGH (ref <1.0)

## 2020-04-26 LAB — D-DIMER, QUANTITATIVE: D-Dimer, Quant: 1.57 ug/mL-FEU — ABNORMAL HIGH (ref 0.00–0.50)

## 2020-04-26 LAB — LACTATE DEHYDROGENASE: LDH: 182 U/L (ref 98–192)

## 2020-04-26 LAB — TRIGLYCERIDES: Triglycerides: 265 mg/dL — ABNORMAL HIGH (ref <150)

## 2020-04-26 LAB — LACTIC ACID, PLASMA: Lactic Acid, Venous: 1 mmol/L (ref 0.5–1.9)

## 2020-04-26 LAB — FERRITIN: Ferritin: 746 ng/mL — ABNORMAL HIGH (ref 11–307)

## 2020-04-26 LAB — SARS CORONAVIRUS 2 BY RT PCR (HOSPITAL ORDER, PERFORMED IN ~~LOC~~ HOSPITAL LAB): SARS Coronavirus 2: POSITIVE — AB

## 2020-04-26 LAB — FIBRINOGEN: Fibrinogen: 533 mg/dL — ABNORMAL HIGH (ref 210–475)

## 2020-04-26 LAB — PROCALCITONIN: Procalcitonin: 0.13 ng/mL

## 2020-04-26 MED ORDER — LACTATED RINGERS IV SOLN: INTRAVENOUS | Status: AC

## 2020-04-26 MED ORDER — ZINC SULFATE 220 (50 ZN) MG PO CAPS
220.0000 mg | ORAL_CAPSULE | Freq: Every day | ORAL | Status: DC
  Administered 2020-04-26 – 2020-04-29 (×4): 220 mg via ORAL
  Filled 2020-04-26 (×4): qty 1

## 2020-04-26 MED ORDER — ALBUTEROL SULFATE HFA 108 (90 BASE) MCG/ACT IN AERS: 2.0000 | INHALATION_SPRAY | Freq: Four times a day (QID) | RESPIRATORY_TRACT | Status: DC | PRN

## 2020-04-26 MED ORDER — LINAGLIPTIN 5 MG PO TABS
5.0000 mg | ORAL_TABLET | Freq: Every day | ORAL | Status: DC
  Administered 2020-04-27 – 2020-04-29 (×3): 5 mg via ORAL
  Filled 2020-04-26 (×5): qty 1

## 2020-04-26 MED ORDER — GUAIFENESIN-DM 100-10 MG/5ML PO SYRP: 10.0000 mL | ORAL_SOLUTION | ORAL | Status: DC | PRN

## 2020-04-26 MED ORDER — SODIUM CHLORIDE 0.9 % IV SOLN
1000.0000 mL | INTRAVENOUS | Status: DC
  Administered 2020-04-26: 1000 mL via INTRAVENOUS

## 2020-04-26 MED ORDER — HYDROCOD POLST-CPM POLST ER 10-8 MG/5ML PO SUER: 5.0000 mL | Freq: Two times a day (BID) | ORAL | Status: DC | PRN

## 2020-04-26 MED ORDER — SODIUM CHLORIDE 0.9 % IV SOLN
200.0000 mg | Freq: Once | INTRAVENOUS | Status: AC
  Administered 2020-04-26: 200 mg via INTRAVENOUS
  Filled 2020-04-26: qty 40

## 2020-04-26 MED ORDER — ROSUVASTATIN CALCIUM 5 MG PO TABS
5.0000 mg | ORAL_TABLET | Freq: Every day | ORAL | Status: DC
  Administered 2020-04-27 – 2020-04-29 (×3): 5 mg via ORAL
  Filled 2020-04-26 (×4): qty 1

## 2020-04-26 MED ORDER — SODIUM CHLORIDE 0.9 % IV SOLN
100.0000 mg | Freq: Every day | INTRAVENOUS | Status: DC
  Administered 2020-04-27 – 2020-04-29 (×3): 100 mg via INTRAVENOUS
  Filled 2020-04-26 (×3): qty 20

## 2020-04-26 MED ORDER — DEXAMETHASONE 4 MG PO TABS
6.0000 mg | ORAL_TABLET | Freq: Every day | ORAL | Status: DC
  Administered 2020-04-26 – 2020-04-29 (×4): 6 mg via ORAL
  Filled 2020-04-26 (×4): qty 1

## 2020-04-26 MED ORDER — INSULIN ASPART 100 UNIT/ML ~~LOC~~ SOLN
0.0000 [IU] | Freq: Three times a day (TID) | SUBCUTANEOUS | Status: DC
  Administered 2020-04-28: 1 [IU] via SUBCUTANEOUS
  Administered 2020-04-29: 2 [IU] via SUBCUTANEOUS
  Administered 2020-04-29: 1 [IU] via SUBCUTANEOUS
  Filled 2020-04-26: qty 0.09

## 2020-04-26 MED ORDER — LACTATED RINGERS IV BOLUS
250.0000 mL | Freq: Once | INTRAVENOUS | Status: AC
  Administered 2020-04-26: 250 mL via INTRAVENOUS

## 2020-04-26 MED ORDER — ASCORBIC ACID 500 MG PO TABS
500.0000 mg | ORAL_TABLET | Freq: Every day | ORAL | Status: DC
  Administered 2020-04-26 – 2020-04-29 (×4): 500 mg via ORAL
  Filled 2020-04-26 (×4): qty 1

## 2020-04-26 NOTE — ED Provider Notes (Addendum)
[facility] DEPT Provider Note   CSN: [id number] Arrival date & time: 04/26/20  1313     History Chief Complaint  Patient presents with  . Cough    Rachel Black is a 69 y.o. female.  The history is provided by the patient, medical records and the EMS personnel. No language interpreter was used.  Cough    69 year old female hx of DM, GERD, HTN, CVA, brought here via EMS for evaluation of generalized weakness.  History is limited as patient has a difficult time recalling history.  Patient reports she has been having cold symptoms for nearly a week.  Symptoms including recurrent cough, generalized weakness, decrease in appetite, loss of taste, feeling nausea, fatigue.  She also recalls vomiting a few times several days ago that is since resolved.  Last night she somehow fell off her bed.  She was unsure if there was any loss of consciousness or why she fell.  She did complain of some pain to her legs from the fall.  Paramedic has to come out to get her back in bed.  She went to urgent care today and test positive for COVID-19.  She admits that she has not had a Covid vaccination.  She denies any significant shortness of breath but admits to overall feeling weak and tired.  No complaints of headache or vision changes.  No dysuria.  She normally walks with a walker.  Per EMS notes, patient was initially hypotensive at urgent care center today where she tested positive for COVID-19.  She received 200 mL of IV fluid prior to arrival.  Past Medical History:  Diagnosis Date  . Arthritis    PAIN AND OA BOTH KNEES AND SHOULDERS AND ELBOWS AND WRIST  . Blood transfusion without reported diagnosis 2018   after cholecystectomy  . Diabetes mellitus    ORAL MEDICATION  . GERD (gastroesophageal reflux disease)   . Gout    NO RECENT FLARE UPS  . H/O: rheumatic fever    AS A CHILD - NO KNOWN HEART MURMUR OR HEART PROBLEMS  . Heart attack ([place])   . Hyperlipidemia   .  Hypertension   . Stroke [facility]) 03/2017    Patient Active Problem List   Diagnosis Date Noted  . Mixed diabetic hyperlipidemia associated with type 2 diabetes mellitus ([place]) 11/27/2019  . Family history of breast cancer in mother 11/27/2019  . Cataract of both eyes 11/27/2019  . Coronary artery disease involving native coronary artery of native heart with angina pectoris ([place]) 02/18/2018  . NSTEMI (non-ST elevated myocardial infarction) ([place])   . Chest pain 10/16/2017  . Need for shingles vaccine 09/21/2017  . Need for pneumococcal vaccine 09/21/2017  . History of rheumatic fever 06/30/2017  . PFO (patent foramen ovale)   . Cerebrovascular accident (CVA) due to embolism of right middle cerebral artery ([place]) 03/14/2017  . Dilation of biliary tract   . Elevated LFTs   . RUQ abdominal pain   . Acute gallstone pancreatitis 12/07/2016  . Choledocholithiasis with obstruction 12/07/2016  . AKI (acute kidney injury) ([place]) 12/07/2016  . Osteoarthritis of right knee 08/23/2015  . Status post total left knee replacement 08/23/2015  . Arthritis of knee, right 04/20/2014  . Status post total right knee replacement 04/20/2014  . Flushing reaction 03/10/2012  . Diabetes mellitus ([place]) 02/17/2007  . Mixed hyperlipidemia 02/17/2007  . GOUT 02/17/2007  . Severe obesity (BMI >= 40) ([place]) 02/17/2007  . Hypertension associated with diabetes (  [place]) 02/17/2007  . RHEUMATIC FEVER, HX OF 02/17/2007    Past Surgical History:  Procedure Laterality Date  . ABDOMINAL HYSTERECTOMY    . CESAREAN SECTION    . CHOLECYSTECTOMY N/A 12/09/2016   Procedure: LAPAROSCOPIC CHOLECYSTECTOMY WITH INTRAOPERATIVE CHOLANGIOGRAM;  Surgeon: Stark Klein, MD;  Location: [facility];  Service: General;  Laterality: N/A;  . COLONOSCOPY WITH PROPOFOL N/A 01/30/2013   Procedure: COLONOSCOPY WITH PROPOFOL;  Surgeon: Irene Shipper, MD;  Location: [facility] END[facility];  Service: Endoscopy;  Laterality: N/A;  . ERCP N/A 12/08/2016   Procedure:  ENDOSCOPIC RETROGRADE CHOLANGIOPANCREATOGRAPHY (ERCP);  Surgeon: Ladene Artist, MD;  Location: [facility];  Service: Endoscopy;  Laterality: N/A;  . ESOPHAGOGASTRODUODENOSCOPY (EGD) WITH PROPOFOL N/A 01/30/2013   Procedure: ESOPHAGOGASTRODUODENOSCOPY (EGD) WITH PROPOFOL;  Surgeon: Irene Shipper, MD;  Location: [facility];  Service: Endoscopy;  Laterality: N/A;  . LEFT HEART CATH AND CORONARY ANGIOGRAPHY N/A 10/18/2017   Procedure: LEFT HEART CATH AND CORONARY ANGIOGRAPHY;  Surgeon: Martinique, Peter M, MD;  Location: [place] LAB;  Service: Cardiovascular;  Laterality: N/A;  . LOOP RECORDER INSERTION N/A 03/18/2017   Procedure: Loop Recorder Insertion;  Surgeon: Constance Haw, MD;  Location: [facility] CV LAB;  Service: Cardiovascular;  Laterality: N/A;  . PARTIAL HYSTERECTOMY    . TEE WITHOUT CARDIOVERSION N/A 03/16/2017   Procedure: TRANSESOPHAGEAL ECHOCARDIOGRAM (TEE);  Surgeon: Acie Fredrickson Wonda Cheng, MD;  Location[facility] ENDOSCOPY;  Service: Cardiovascular;  Laterality: N/A;  . TOTAL KNEE ARTHROPLASTY Right 04/20/2014   Procedure: RIGHT TOTAL KNEE ARTHROPLASTY CONVERTED TO RIGHT KNEE REIMPLANTATION;  Surgeon: Mcarthur Rossetti, MD;  Location: [facility];  Service: Orthopedics;  Laterality: Right;  . TOTAL KNEE ARTHROPLASTY Left 08/23/2015   Procedure: LEFT TOTAL KNEE ARTHROPLASTY;  Surgeon: Mcarthur Rossetti, MD;  Location: [facility];  Service: Orthopedics;  Laterality: Left;  Marland Kitchen VENTRAL HERNIA REPAIR     x2     OB History   No obstetric history on file.     Family History  Problem Relation Age of Onset  . Diabetes Mother   . Hypertension Mother        entire family  . Breast cancer Mother        diagnosed in her 25's  . Stroke Father        CVA  . Hyperlipidemia Father        entire family  . Diabetes Father   . Heart disease Father        No details.  Not at an early age.    . Crohn's disease Son   . Irritable bowel syndrome Son     Social History   Tobacco Use  .  Smoking status: Never Smoker  . Smokeless tobacco: Never Used  Substance Use Topics  . Alcohol use: No  . Drug use: No    Home Medications Prior to Admission medications   Medication Sig Start Date End Date Taking? Authorizing Provider  ACCU-CHEK SOFTCLIX LANCETS lancets Use as instructed once a day.  DX Code: E11.9 09/21/17   Carollee Herter, Alferd Apa, DO  allopurinol (ZYLOPRIM) 300 MG tablet Take 1 tablet (300 mg total) by mouth 2 (two) times daily. Needs ov/follow up 02/26/20   Lorrene Reid, PA-C  aspirin 81 MG EC tablet Take 1 tablet (81 mg total) by mouth daily. 10/18/17   Cheryln Manly, NP  Calcium Carb-Cholecalciferol (CALCIUM 600-D PO) Take 1 tablet by mouth 2 (two) times daily before a meal.    [provider]  DEXILANT 60 MG capsule TAKE 1 CAPSULE BY MOUTH TWICE A DAY 10/16/19   Irene Shipper, MD  Emollient (UDDERLY SMOOTH) CREA Apply 1 application topically See admin instructions. Apply topically to skin folds as needed for rash/irritation    [provider]  Famotidine (PEPCID PO) Take by mouth daily as needed.    [provider]  fenofibrate 160 MG tablet Take 1 tablet (160 mg total) by mouth daily. 02/27/20   Lorrene Reid, PA-C  ferrous sulfate 325 (65 FE) MG tablet Take 1 tablet (325 mg total) by mouth 2 (two) times daily. 08/04/19   Irene Shipper, MD  glucose blood (ACCU-CHEK AVIVA PLUS) test strip Use as instructed once a day.  DX Code E11.9 06/06/19   Carollee Herter, Alferd Apa, DO  isosorbide mononitrate (IMDUR) 30 MG 24 hr tablet Take 1 tablet (30 mg total) by mouth daily. 12/06/19   Lendon Colonel, NP  methocarbamol (ROBAXIN) 500 MG tablet TAKE 1 TABLET (500 MG TOTAL) BY MOUTH EVERY 8 (EIGHT) HOURS AS NEEDED FOR MUSCLE SPASMS. 01/18/20   Mcarthur Rossetti, MD  nitroGLYCERIN (NITROSTAT) 0.4 MG SL tablet Place 1 tablet (0.4 mg total) under the tongue every 5 (five) minutes x 3 doses as needed for chest pain. 08/08/19   Minus Breeding, MD    olmesartan (BENICAR) 5 MG tablet Take 2 tablets (10 mg total) by mouth daily. 01/12/20   Opalski, Neoma Laming, DO  rosuvastatin (CRESTOR) 40 MG tablet Take 1 tablet (40 mg total) by mouth daily. 12/06/19   Lendon Colonel, NP  SitaGLIPtin-MetFORMIN HCl (JANUMET XR) [phone] MG TB24 Take 1 tablet by mouth daily. 11/30/19   Mellody Dance, DO  Vitamin D, Ergocalciferol, (DRISDOL) 1.25 MG (50000 UNIT) CAPS capsule Take one tablet wkly 11/28/19   Opalski, Deborah, DO    Allergies    Other and Penicillins  Review of Systems   Review of Systems  Unable to perform ROS: Mental status change  Respiratory: Positive for cough.   All other systems reviewed and are negative.   Physical Exam Updated Vital Signs BP 116/67 (BP Location: Right Arm)   Pulse 80   Temp 100.2 F (37.9 C) (Oral)   Resp 18   Ht 5' 6"  (1.676 m)   Wt 105.7 kg   SpO2 95%   BMI 37.61 kg/m   Physical Exam Vitals and nursing note reviewed.  Constitutional:      General: She is not in acute distress.    Appearance: She is well-developed. She is obese.  HENT:     Head: Atraumatic.     Mouth/Throat:     Mouth: Mucous membranes are moist.  Eyes:     Extraocular Movements: Extraocular movements intact.     Conjunctiva/sclera: Conjunctivae normal.     Pupils: Pupils are equal, round, and reactive to light.  Cardiovascular:     Rate and Rhythm: Normal rate and regular rhythm.     Pulses: Normal pulses.     Heart sounds: Normal heart sounds.  Pulmonary:     Effort: Pulmonary effort is normal.     Breath sounds: Rales (Faint crackles heard at lung bases) present.  Abdominal:     Palpations: Abdomen is soft.     Tenderness: There is no abdominal tenderness.  Musculoskeletal:     Cervical back: Normal range of motion and neck supple. No rigidity.     Comments: Global weakness with equal strength throughout all 4 extremities.  Skin:  Findings: No rash.  Neurological:     Mental Status: She is alert.     GCS: GCS  eye subscore is 4. GCS verbal subscore is 5. GCS motor subscore is 6.     Comments: Patient is alert to self and place but unable to recall month, year.  She is able to recall current president.  Psychiatric:        Mood and Affect: Mood normal.     ED Results / Procedures / Treatments   Labs (all labs ordered are listed, but only abnormal results are displayed) Labs Reviewed  CBC WITH DIFFERENTIAL/PLATELET - Abnormal; Notable for the following components:      Result Value   RBC 3.26 (*)    Hemoglobin 10.2 (*)    HCT 31.8 (*)    Platelets 130 (*)    All other components within normal limits  COMPREHENSIVE METABOLIC PANEL - Abnormal; Notable for the following components:   CO2 21 (*)    Glucose, Bld 109 (*)    BUN 56 (*)    Creatinine, Ser 2.66 (*)    Total Protein 6.2 (*)    Albumin 3.4 (*)    AST 42 (*)    Alkaline Phosphatase 36 (*)    GFR calc non Af Amer 18 (*)    GFR calc Af Amer 21 (*)    All other components within normal limits  D-DIMER, QUANTITATIVE (NOT AT [facility]) - Abnormal; Notable for the following components:   D-Dimer, Quant 1.57 (*)    All other components within normal limits  TRIGLYCERIDES - Abnormal; Notable for the following components:   Triglycerides 265 (*)    All other components within normal limits  FIBRINOGEN - Abnormal; Notable for the following components:   Fibrinogen 533 (*)    All other components within normal limits  CULTURE, BLOOD (ROUTINE X 2)  CULTURE, BLOOD (ROUTINE X 2)  SARS CORONAVIRUS 2 BY RT PCR (HOSPITAL ORDER, [place] [facility])  LACTIC ACID, PLASMA  LACTATE DEHYDROGENASE  PROCALCITONIN  FERRITIN  C-REACTIVE PROTEIN    EKG EKG Interpretation  Date/Time:  Friday April 26 2020 16:04:39 EDT Ventricular Rate:  79 PR Interval:    QRS Duration: 109 QT Interval:  374 QTC Calculation: 429 R Axis:   -94 Text Interpretation: Sinus rhythm Left anterior fascicular block Consider anterior infarct No  significant change since last tracing Confirmed by Calvert Cantor [phone]) on 04/26/2020 4:17:55 PM   Radiology DG Chest Port 1 View  Result Date: 04/26/2020 CLINICAL DATA:  Cough. Reported COVID-19 positive. Syncopal episode. EXAM: PORTABLE CHEST 1 VIEW COMPARISON:  December 08, 2017 FINDINGS: Atelectatic change noted in the left mid lung. Subtle increased opacity in the right upper and mid lung regions. No consolidation. Heart is borderline enlarged. With pulmonary vascularity normal. No adenopathy. No bone lesions. IMPRESSION: Ill-defined areas of airspace opacity in the right upper and right mid lung regions, likely early multifocal pneumonia. Atelectasis left mid lung. Suspect atypical organism pneumonia, particularly given the clinical history. Heart borderline enlarged.  No evident adenopathy. Electronically Signed   By: Lowella Grip III M.D.   On: 04/26/2020 14:55    Procedures Procedures (including critical care time)  Medications Ordered in ED Medications  0.9 %  sodium chloride infusion (1,000 mLs Intravenous New Bag/Given 04/26/20 1535)  insulin aspart (novoLOG) injection 0-9 Units (has no administration in time range)  remdesivir 200 mg in sodium chloride 0.9% 250 mL IVPB (has no administration in time  range)    Followed by  remdesivir 100 mg in sodium chloride 0.9 % 100 mL IVPB (has no administration in time range)    ED Course  I have reviewed the triage vital signs and the nursing notes.  Pertinent labs & imaging results that were available during my care of the patient were reviewed by me and considered in my medical decision making (see chart for details).    MDM Rules/Calculators/A&P                          BP (!) 92/45 (BP Location: Left Arm)   Pulse 78   Temp 100.2 F (37.9 C) (Oral)   Resp (!) 22   Ht 5' 6"  (1.676 m)   Wt 105.7 kg   SpO2 94%   BMI 37.61 kg/m   Final Clinical Impression(s) / ED Diagnoses Final diagnoses:  AKI (acute kidney injury)  ([place])  Pneumonia due to COVID-19 virus    Rx / DC Orders ED Discharge Orders    None     2:49 PM This is an elderly female with multiple comorbidities recently test positive for COVID-19 here with generalized weakness and decrease in appetite.  Patient was initially found to be hypotensive at the urgent care center today when she was evaluated for Covid symptoms.  She received IV fluid prior to arrival.  At this time patient appears drowsy but arousable and answer question appropriately.  She is alert and oriented x2.  Suspects some elements of delirium causing her generalized weakness and confusion.  She is not hypoxic.  Work-up initiated.  5:07 PM Labs remarkable for worsening renal function with a creatinine of 2.66.  This is markedly elevated from prior creatinine value back in March.  Elevated D-dimer of 1.57, as well as elevated inflammatory markers.  Chest x-ray demonstrate ill-defined area of airspace opacity to the right upper and right mid lungs likely early multifocal pneumonia.  I will repeat her COVID-19 test here but she report tested positive COVID-19 at an outside facility.  Appreciate consultation from [facility] hospitalist, Dr. Florene Glen, who will see and admit patient.  He also request patient to be started on remdesivir.  I did gave a call to pt's husband for an update.  He is aware of plan.  Rachel Black was evaluated in Emergency Department on 04/26/2020 for the symptoms described in the history of present illness. She was evaluated in the context of the global COVID-19 pandemic, which necessitated consideration that the patient might be at risk for infection with the SARS-CoV-2 virus that causes COVID-19. Institutional protocols and algorithms that pertain to the evaluation of patients at risk for COVID-19 are in a state of rapid change based on information released by regulatory bodies including the CDC and federal and state organizations. These policies and algorithms were  followed during the patient's care in the ED.       Domenic Moras, PA-C 04/26/20 [place], MD 05/15/20 1147

## 2020-04-26 NOTE — ED Notes (Signed)
Pt BIB EMS. Pt was sent here from urgent care. Pt + covid 4xdays. Pt complaining of resistant cough. Pt reports no SOB. Pt reports having a syncopal episode last night; slid on ground. No LOC. Fire dept came to help patient up. Pt had 237m fluids with EMS due to being hypotensive at urgent care.   BP-120/80 (after fluids) Temp-98.6 CBG-125

## 2020-04-26 NOTE — H&P (Signed)
History and Physical    DRUANNE BOSQUES QHU:[id number] DOB: 28-May-1951 DOA: 04/26/2020  PCP: Lorrene Reid, PA-C Patient coming from: home  I have personally briefly reviewed patient's old medical records in [place]  Chief Complaint: malaise  HPI: Rachel Black is Kristian Hazzard 69 y.o. female with medical history significant of stroke and CAD, T2DM, hypertension, HLD, gout who presents with malaise, cough with recent reported positive COVID 19 testing.  She's had symptoms for about 1-2 weeks.  She notes she's been feeling drowsy, not hungry, and just out of it.  She's been generally weak.  She reported Sudeep Scheibel few episodes of vomiting Quindell Shere few days ago, but this is now resolved.  Last night she fell out of bed.  She denies syncope or LOC to me.  Sounds like she was generally weak.  They had to call the paramedics who came and helped her back into bed.  Today, due to continued symptoms, she and her husband went to urgent care for testing and she was noted to be positive.  She was also hypotensive at urgent care initially.  She denies SOB, CP, abdominal pain, nausea, vomiting.  She notes decreased appetite.  She was sent from Urgent Care with hypotension, was given 200 cc fluid by EMS.  Denies smoking and etoh.   ED Course: Labs, EKG, IVF.  Pending COVID testing.  Admit for reported COVID and AKI.  Review of Systems: As per HPI otherwise all other systems reviewed and are negative.   Past Medical History:  Diagnosis Date  . Arthritis    PAIN AND OA BOTH KNEES AND SHOULDERS AND ELBOWS AND WRIST  . Blood transfusion without reported diagnosis 2018   after cholecystectomy  . Diabetes mellitus    ORAL MEDICATION  . GERD (gastroesophageal reflux disease)   . Gout    NO RECENT FLARE UPS  . H/O: rheumatic fever    AS Devlyn Retter CHILD - NO KNOWN HEART MURMUR OR HEART PROBLEMS  . Heart attack ([place])   . Hyperlipidemia   . Hypertension   . Stroke [facility]) 03/2017    Past Surgical History:  Procedure  Laterality Date  . ABDOMINAL HYSTERECTOMY    . CESAREAN SECTION    . CHOLECYSTECTOMY N/Rielle Schlauch 12/09/2016   Procedure: LAPAROSCOPIC CHOLECYSTECTOMY WITH INTRAOPERATIVE CHOLANGIOGRAM;  Surgeon: Stark Klein, MD;  Location: [facility];  Service: General;  Laterality: N/Jasaiah Karwowski;  . COLONOSCOPY WITH PROPOFOL N/Romolo Sieling 01/30/2013   Procedure: COLONOSCOPY WITH PROPOFOL;  Surgeon: Irene Shipper, MD;  Location: [facility];  Service: Endoscopy;  Laterality: N/Delrico Minehart;  . ERCP N/Rakiya Krawczyk 12/08/2016   Procedure: ENDOSCOPIC RETROGRADE CHOLANGIOPANCREATOGRAPHY (ERCP);  Surgeon: Ladene Artist, MD;  [facility]: [facility];  Service: Endoscopy;  Laterality: N/Santia Labate;  . ESOPHAGOGASTRODUODENOSCOPY (EGD) WITH PROPOFOL N/Cortavious Nix 01/30/2013   Procedure: ESOPHAGOGASTRODUODENOSCOPY (EGD) WITH PROPOFOL;  Surgeon: Irene Shipper, MD;  [facility];  Service: Endoscopy;  Laterality: N/Alpha Chouinard;  . LEFT HEART CATH AND CORONARY ANGIOGRAPHY N/Novalie Leamy 10/18/2017   Procedure: LEFT HEART CATH AND CORONARY ANGIOGRAPHY;  Surgeon: Martinique, Peter M, MD;  Location: [place] [facility];  Service: Cardiovascular;  Laterality: N/Meet Weathington;  . LOOP RECORDER INSERTION N/Cosima Prentiss 03/18/2017   Procedure: Loop Recorder Insertion;  Surgeon: Constance Haw, MD;  Location: [place] [facility];  Service: Cardiovascular;  Laterality: N/Adella Manolis;  . PARTIAL HYSTERECTOMY    . TEE WITHOUT CARDIOVERSION N/Azaliah Carrero 03/16/2017   Procedure: TRANSESOPHAGEAL ECHOCARDIOGRAM (TEE);  Surgeon: Acie Fredrickson Wonda Cheng, MD;  Location: [place];  Service: Cardiovascular;  Laterality: N/Tiandra Swoveland;  .  TOTAL KNEE ARTHROPLASTY Right 04/20/2014   Procedure: RIGHT TOTAL KNEE ARTHROPLASTY CONVERTED TO RIGHT KNEE REIMPLANTATION;  Surgeon: Mcarthur Rossetti, MD;  [facility];  Service: Orthopedics;  Laterality: Right;  . TOTAL KNEE ARTHROPLASTY Left 08/23/2015   Procedure: LEFT TOTAL KNEE ARTHROPLASTY;  Surgeon: Mcarthur Rossetti, MD;  [facility];  Service: Orthopedics;  Laterality: Left;  Marland Kitchen VENTRAL HERNIA REPAIR     x2    Social  History  reports that she has never smoked. She has never used smokeless tobacco. She reports that she does not drink alcohol and does not use drugs.  Allergies  Allergen Reactions  . Other Other (See Comments)    SOME BANDAIDS CAUSE SKIN IRRITATION  . Penicillins Itching    Has patient had Decorey Wahlert PCN reaction causing immediate rash, facial/tongue/throat swelling, SOB or lightheadedness with hypotension: No Has patient had Jonanthony Nahar PCN reaction causing severe rash involving mucus membranes or skin necrosis: No Has patient had Enoc Getter PCN reaction that required hospitalization No Has patient had Analeese Andreatta PCN reaction occurring within the last 10 years: No If all of the above answers are "NO", then may proceed with Cephalosporin use.     Family History  Problem Relation Age of Onset  . Diabetes Mother   . Hypertension Mother        entire family  . Breast cancer Mother        diagnosed in her 73's  . Stroke Father        CVA  . Hyperlipidemia Father        entire family  . Diabetes Father   . Heart disease Father        No details.  Not at an early age.    . Crohn's disease Son   . Irritable bowel syndrome Son    Prior to Admission medications   Medication Sig Start Date End Date Taking? Authorizing Provider  ACCU-CHEK SOFTCLIX LANCETS lancets Use as instructed once Avigail Pilling day.  DX Code: E11.9 09/21/17   Carollee Herter, Alferd Apa, DO  allopurinol (ZYLOPRIM) 300 MG tablet Take 1 tablet (300 mg total) by mouth 2 (two) times daily. Needs ov/follow up 02/26/20   Lorrene Reid, PA-C  aspirin 81 MG EC tablet Take 1 tablet (81 mg total) by mouth daily. 10/18/17   Cheryln Manly, NP  Calcium Carb-Cholecalciferol (CALCIUM 600-D PO) Take 1 tablet by mouth 2 (two) times daily before Ysabela Keisler meal.    [provider]  DEXILANT 60 MG capsule TAKE 1 CAPSULE BY MOUTH TWICE Aleni Andrus DAY 10/16/19   Irene Shipper, MD  Emollient (UDDERLY SMOOTH) CREA Apply 1 application topically See admin instructions. Apply topically to skin folds as  needed for rash/irritation    [provider]  Famotidine (PEPCID PO) Take by mouth daily as needed.    [provider]  fenofibrate 160 MG tablet Take 1 tablet (160 mg total) by mouth daily. 02/27/20   Lorrene Reid, PA-C  ferrous sulfate 325 (65 FE) MG tablet Take 1 tablet (325 mg total) by mouth 2 (two) times daily. 08/04/19   Irene Shipper, MD  glucose blood (ACCU-CHEK AVIVA PLUS) test strip Use as instructed once Tevon Berhane day.  DX Code E11.9 06/06/19   Carollee Herter, Alferd Apa, DO  isosorbide mononitrate (IMDUR) 30 MG 24 hr tablet Take 1 tablet (30 mg total) by mouth daily. 12/06/19   Lendon Colonel, NP  methocarbamol (ROBAXIN) 500 MG tablet TAKE 1 TABLET (500 MG TOTAL) BY  MOUTH EVERY 8 (EIGHT) HOURS AS NEEDED FOR MUSCLE SPASMS. 01/18/20   Mcarthur Rossetti, MD  nitroGLYCERIN (NITROSTAT) 0.4 MG SL tablet Place 1 tablet (0.4 mg total) under the tongue every 5 (five) minutes x 3 doses as needed for chest pain. 08/08/19   Minus Breeding, MD  olmesartan (BENICAR) 5 MG tablet Take 2 tablets (10 mg total) by mouth daily. 01/12/20   Opalski, Neoma Laming, DO  rosuvastatin (CRESTOR) 40 MG tablet Take 1 tablet (40 mg total) by mouth daily. 12/06/19   Lendon Colonel, NP  SitaGLIPtin-MetFORMIN HCl (JANUMET XR) [phone] MG TB24 Take 1 tablet by mouth daily. 11/30/19   Mellody Dance, DO  Vitamin D, Ergocalciferol, (DRISDOL) 1.25 MG (50000 UNIT) CAPS capsule Take one tablet wkly 11/28/19   Mellody Dance, DO    Physical Exam: Vitals:   04/26/20 1350 04/26/20 1352 04/26/20 1606  BP: 116/67  (!) 92/45  Pulse: 80  78  Resp: 18  (!) 22  Temp: 100.2 F (37.9 C)    TempSrc: Oral    SpO2: 95%  94%  Weight:  105.7 kg   Height:  5' 6"  (1.676 m)     Constitutional: NAD, calm, comfortable Vitals:   04/26/20 1350 04/26/20 1352 04/26/20 1606  BP: 116/67  (!) 92/45  Pulse: 80  78  Resp: 18  (!) 22  Temp: 100.2 F (37.9 C)    TempSrc: Oral    SpO2: 95%  94%  Weight:  105.7 kg    Height:  5' 6"  (1.676 m)    Eyes: PERRL, lids and conjunctivae normal ENMT: Mucous membranes are moist. Posterior pharynx clear of any exudate or lesions.Normal dentition.  Neck: normal, supple, no masses, no thyromegaly Respiratory: unlabored, occasional cough Cardiovascular: Regular rate and rhythm, no murmurs / rubs / gallops. No extremity edema.   Abdomen: no tenderness, no masses palpated. No hepatosplenomegaly. Bowel sounds positive.  Musculoskeletal: no clubbing / cyanosis. No joint deformity upper and lower extremities. Good ROM, no contractures. Normal muscle tone.  Skin: no rashes, lesions, ulcers. No induration Neurologic: CN 2-12 grossly intact. Sensation intact.  Moving all extremities.  Psychiatric: Normal judgment and insight. Alert and oriented x 3. Normal mood.   Labs on Admission: I have personally reviewed following labs and imaging studies  CBC: Recent Labs  Lab 04/26/20 1402  WBC 5.9  NEUTROABS 3.8  HGB 10.2*  HCT 31.8*  MCV 97.5  PLT 130*    Basic Metabolic Panel: Recent Labs  Lab 04/26/20 1402  NA 137  K 4.2  CL 102  CO2 21*  GLUCOSE 109*  BUN 56*  CREATININE 2.66*  CALCIUM 9.0    GFR: Estimated Creatinine Clearance: 25.2 mL/min (Narmeen Kerper) (by C-G formula based on SCr of 2.66 mg/dL (H)).  Liver Function Tests: Recent Labs  Lab 04/26/20 1402  AST 42*  ALT 19  ALKPHOS 36*  BILITOT 0.7  PROT 6.2*  ALBUMIN 3.4*    Urine analysis:    Component Value Date/Time   COLORURINE AMBER (Karle Desrosier) 12/07/2016 0038   APPEARANCEUR CLOUDY (Herberth Deharo) 12/07/2016 0038   LABSPEC 1.027 12/07/2016 0038   PHURINE 5.0 12/07/2016 0038   GLUCOSEU NEGATIVE 12/07/2016 0038   HGBUR NEGATIVE 12/07/2016 0038   BILIRUBINUR SMALL (Pacey Willadsen) 12/07/2016 0038   BILIRUBINUR neg 12/09/2015 1701   KETONESUR NEGATIVE 12/07/2016 0038   PROTEINUR NEGATIVE 12/07/2016 0038   UROBILINOGEN 0.2 12/09/2015 1701   UROBILINOGEN 0.2 04/13/2014 1422   NITRITE NEGATIVE 12/07/2016 0038   LEUKOCYTESUR  MODERATE (Ulice Follett) 12/07/2016  0038    Radiological Exams on Admission: DG Chest Port 1 View  Result Date: 04/26/2020 CLINICAL DATA:  Cough. Reported COVID-19 positive. Syncopal episode. EXAM: PORTABLE CHEST 1 VIEW COMPARISON:  December 08, 2017 FINDINGS: Atelectatic change noted in the left mid lung. Subtle increased opacity in the right upper and mid lung regions. No consolidation. Heart is borderline enlarged. With pulmonary vascularity normal. No adenopathy. No bone lesions. IMPRESSION: Ill-defined areas of airspace opacity in the right upper and right mid lung regions, likely early multifocal pneumonia. Atelectasis left mid lung. Suspect atypical organism pneumonia, particularly given the clinical history. Heart borderline enlarged.  No evident adenopathy. Electronically Signed   By: Lowella Grip III M.D.   On: 04/26/2020 14:55    EKG: Independently reviewed. sinus, LAFB  Assessment/Plan Active Problems:   AKI (acute kidney injury) ([place])  Reported COVID 19 Virus Infection  Multifocal Pneumonia likely 2/2 COVID 19 Pneumonia: Unvaccinated CXR with suspected early multifocal pneumonia Admitted as PUI as we don't have positive test from UC Awaiting positive test here Start remdesivir empirically.  Satting 94% on RA, borderline for steroids, will go ahead and start at this time with multifocal pneumonia and elevated inflammatory markers below.   Procal mildly elevated, doubt concomitant bacterial infection - follow Elevated inflammatory markers, will trend I/O, daily weights OOB, prone as able, IS, flutter valve, PT  COVID-19 Labs  Recent Labs    04/26/20 1402  DDIMER 1.57*  FERRITIN 746*  LDH 182  CRP 7.0*    No results found for: SARSCOV2NAA  Acute Kidney Injury: baseline creatinine 1.2-1.4.  Will continue IVF.  Suspect dehydration/hypovolemia 2/2 poor PO intake from covid above Follow UA Follow renal US Hold ARB Continue LR  Hypotension: suspect related to poor PO intake,  hypovolemia, BP meds.  Follow with IVF.  Follow orthostatics.  Elevated D dimer: 2/2 covid infection, follow.  If rising, consider additional w/u with LE [place] and r/o PE.  Thrombocytopenia: likely 2/2 covid infection  Hypertension: hold BP meds with hypotension above.  Holding imdur, benicar.   Mechanical Fall: suspect this was related to generalized weakness from COVID 19 infection.  Denies syncope or LOC.  Will follow therapy eval and hydration.  Follow orthostatics when able  Gout: hold allopurinol today, follow creatinine tomorrow, consider resumption   HLD: crestor reduced dose.  Resume fenofibrate.    T2DM: SSI.  Hold janumet.  Continue ASA.  Hx Stroke  Hx CAD: continue asa. Crestor reduced dose.  Obesity Body mass index is 37.61 kg/m.  DVT prophylaxis: heparin  Code Status:   full  Family Communication:  None at bedside  Disposition Plan:   Patient is from:  home  Anticipated DC to:  home  Anticipated DC date:  8/15  Anticipated DC barriers: Pending improvement in renal function and oxygenation  Consults called:  none  Admission status:  inpatient   Severity of Illness: The appropriate patient status for this patient is INPATIENT. Inpatient status is judged to be reasonable and necessary in order to provide the required intensity of service to ensure the patient's safety. The patient's presenting symptoms, physical exam findings, and initial radiographic and laboratory data in the context of their chronic comorbidities is felt to place them at high risk for further clinical deterioration. Furthermore, it is not anticipated that the patient will be medically stable for discharge from the hospital within 2 midnights of admission. The following factors support the patient status of inpatient.   " The patient's presenting symptoms  include poor po intake, malaise. " The worrisome physical exam findings include hypoxia with o2 sat 94% on RA. " The initial radiographic and  laboratory data are worrisome because of multifocal pneumonia, acute kidney injury, elevated inflammatory markers. " The chronic co-morbidities include T2DM, obesity, hypertension.   * I certify that at the point of admission it is my clinical judgment that the patient will require inpatient hospital care spanning beyond 2 midnights from the point of admission due to high intensity of service, high risk for further deterioration and high frequency of surveillance required.Fayrene Helper MD [facility]  How to contact the [facility] Attending or Consulting provider [place] or covering provider during after hours [place], for this patient?   1. Check the care team in [facility] and look for [place] Selle[place]) attending/consulting [facility] provider listed and b) the [facility] team listed 2. Log into [email or web address] and use [place]'s universal password to access. If you do not have the password, please contact the hospital operator. 3. Locate the [facility] provider you are looking for under [facility] and page to Cadin Luka number that you can be directly reached. 4. If you still have difficulty reaching the provider, please page the [facility] (Director on Call) for the Hospitalists listed on amion for assistance.  04/26/2020, 5:44 PM

## 2020-04-26 NOTE — ED Notes (Signed)
First lactic within normal limits. D/C second lactic.

## 2020-04-27 ENCOUNTER — Inpatient Hospital Stay (HOSPITAL_COMMUNITY): Payer: BC Managed Care – PPO

## 2020-04-27 LAB — CBC WITH DIFFERENTIAL/PLATELET
Abs Immature Granulocytes: 0.05 10*3/uL (ref 0.00–0.07)
Basophils Absolute: 0 10*3/uL (ref 0.0–0.1)
Basophils Relative: 0 %
Eosinophils Absolute: 0 10*3/uL (ref 0.0–0.5)
Eosinophils Relative: 0 %
HCT: 31.4 % — ABNORMAL LOW (ref 36.0–46.0)
Hemoglobin: 10.1 g/dL — ABNORMAL LOW (ref 12.0–15.0)
Immature Granulocytes: 1 %
Lymphocytes Relative: 23 %
Lymphs Abs: 1.1 10*3/uL (ref 0.7–4.0)
MCH: 31.1 pg (ref 26.0–34.0)
MCHC: 32.2 g/dL (ref 30.0–36.0)
MCV: 96.6 fL (ref 80.0–100.0)
Monocytes Absolute: 0.1 10*3/uL (ref 0.1–1.0)
Monocytes Relative: 2 %
Neutro Abs: 3.5 10*3/uL (ref 1.7–7.7)
Neutrophils Relative %: 74 %
Platelets: 120 10*3/uL — ABNORMAL LOW (ref 150–400)
RBC: 3.25 MIL/uL — ABNORMAL LOW (ref 3.87–5.11)
RDW: 13.5 % (ref 11.5–15.5)
WBC: 4.7 10*3/uL (ref 4.0–10.5)
nRBC: 0 % (ref 0.0–0.2)

## 2020-04-27 LAB — HEMOGLOBIN A1C
Hgb A1c MFr Bld: 6.1 % — ABNORMAL HIGH (ref 4.8–5.6)
Mean Plasma Glucose: 128.37 mg/dL

## 2020-04-27 LAB — COMPREHENSIVE METABOLIC PANEL
ALT: 20 U/L (ref 0–44)
AST: 44 U/L — ABNORMAL HIGH (ref 15–41)
Albumin: 3 g/dL — ABNORMAL LOW (ref 3.5–5.0)
Alkaline Phosphatase: 36 U/L — ABNORMAL LOW (ref 38–126)
Anion gap: 12 (ref 5–15)
BUN: 49 mg/dL — ABNORMAL HIGH (ref 8–23)
CO2: 15 mmol/L — ABNORMAL LOW (ref 22–32)
Calcium: 8.6 mg/dL — ABNORMAL LOW (ref 8.9–10.3)
Chloride: 103 mmol/L (ref 98–111)
Creatinine, Ser: 1.85 mg/dL — ABNORMAL HIGH (ref 0.44–1.00)
GFR calc Af Amer: 32 mL/min — ABNORMAL LOW (ref >60)
GFR calc non Af Amer: 28 mL/min — ABNORMAL LOW (ref >60)
Glucose, Bld: 153 mg/dL — ABNORMAL HIGH (ref 70–99)
Potassium: 4.2 mmol/L (ref 3.5–5.1)
Sodium: 130 mmol/L — ABNORMAL LOW (ref 135–145)
Total Bilirubin: 0.7 mg/dL (ref 0.3–1.2)
Total Protein: 6.2 g/dL — ABNORMAL LOW (ref 6.5–8.1)

## 2020-04-27 LAB — CBG MONITORING, ED
Glucose-Capillary: 143 mg/dL — ABNORMAL HIGH (ref 70–99)
Glucose-Capillary: 146 mg/dL — ABNORMAL HIGH (ref 70–99)
Glucose-Capillary: 170 mg/dL — ABNORMAL HIGH (ref 70–99)

## 2020-04-27 LAB — URINALYSIS, ROUTINE W REFLEX MICROSCOPIC
Bilirubin Urine: NEGATIVE
Glucose, UA: NEGATIVE mg/dL
Ketones, ur: NEGATIVE mg/dL
Nitrite: NEGATIVE
Protein, ur: 100 mg/dL — AB
Specific Gravity, Urine: 1.025 (ref 1.005–1.030)
WBC, UA: >50 WBC/hpf — ABNORMAL HIGH (ref 0–5)
pH: 5 (ref 5.0–8.0)

## 2020-04-27 LAB — PHOSPHORUS: Phosphorus: 3.1 mg/dL (ref 2.5–4.6)

## 2020-04-27 LAB — HIV ANTIBODY (ROUTINE TESTING W REFLEX): HIV Screen 4th Generation wRfx: NONREACTIVE

## 2020-04-27 LAB — C-REACTIVE PROTEIN: CRP: 7.8 mg/dL — ABNORMAL HIGH (ref <1.0)

## 2020-04-27 LAB — D-DIMER, QUANTITATIVE: D-Dimer, Quant: 1.59 ug/mL-FEU — ABNORMAL HIGH (ref 0.00–0.50)

## 2020-04-27 LAB — FERRITIN: Ferritin: 744 ng/mL — ABNORMAL HIGH (ref 11–307)

## 2020-04-27 LAB — BRAIN NATRIURETIC PEPTIDE: B Natriuretic Peptide: 9.2 pg/mL (ref 0.0–100.0)

## 2020-04-27 LAB — MAGNESIUM: Magnesium: 2.2 mg/dL (ref 1.7–2.4)

## 2020-04-27 LAB — ABO/RH: ABO/RH(D): A POS

## 2020-04-27 LAB — PROCALCITONIN: Procalcitonin: <0.1 ng/mL

## 2020-04-27 MED ORDER — FENOFIBRATE 160 MG PO TABS
160.0000 mg | ORAL_TABLET | Freq: Every day | ORAL | Status: DC
  Administered 2020-04-27 – 2020-04-29 (×3): 160 mg via ORAL
  Filled 2020-04-27 (×3): qty 1

## 2020-04-27 MED ORDER — OXYCODONE HCL 5 MG PO TABS
5.0000 mg | ORAL_TABLET | Freq: Four times a day (QID) | ORAL | Status: DC | PRN
  Filled 2020-04-27: qty 1

## 2020-04-27 MED ORDER — FERROUS SULFATE 325 (65 FE) MG PO TABS
325.0000 mg | ORAL_TABLET | Freq: Two times a day (BID) | ORAL | Status: DC
  Administered 2020-04-27 – 2020-04-29 (×6): 325 mg via ORAL
  Filled 2020-04-27 (×5): qty 1

## 2020-04-27 MED ORDER — ACETAMINOPHEN 325 MG PO TABS: 650.0000 mg | ORAL_TABLET | Freq: Four times a day (QID) | ORAL | Status: DC | PRN

## 2020-04-27 MED ORDER — ACETAMINOPHEN 650 MG RE SUPP: 650.0000 mg | Freq: Four times a day (QID) | RECTAL | Status: DC | PRN

## 2020-04-27 MED ORDER — STERILE WATER FOR INJECTION IV SOLN
INTRAVENOUS | Status: AC
  Filled 2020-04-27: qty 150
  Filled 2020-04-27: qty 850

## 2020-04-27 MED ORDER — POLYETHYLENE GLYCOL 3350 17 G PO PACK: 17.0000 g | PACK | Freq: Every day | ORAL | Status: DC | PRN

## 2020-04-27 MED ORDER — LACTATED RINGERS IV SOLN: INTRAVENOUS | Status: DC

## 2020-04-27 MED ORDER — HEPARIN SODIUM (PORCINE) 5000 UNIT/ML IJ SOLN
5000.0000 [IU] | Freq: Three times a day (TID) | INTRAMUSCULAR | Status: DC
  Administered 2020-04-27 – 2020-04-29 (×9): 5000 [IU] via SUBCUTANEOUS
  Filled 2020-04-27 (×9): qty 1

## 2020-04-27 MED ORDER — ASPIRIN EC 81 MG PO TBEC
81.0000 mg | DELAYED_RELEASE_TABLET | Freq: Every day | ORAL | Status: DC
  Administered 2020-04-27 – 2020-04-29 (×3): 81 mg via ORAL
  Filled 2020-04-27 (×3): qty 1

## 2020-04-27 MED ORDER — PANTOPRAZOLE SODIUM 40 MG PO TBEC
40.0000 mg | DELAYED_RELEASE_TABLET | Freq: Every day | ORAL | Status: DC
  Administered 2020-04-27 – 2020-04-29 (×3): 40 mg via ORAL
  Filled 2020-04-27 (×3): qty 1

## 2020-04-27 MED ORDER — ALLOPURINOL 300 MG PO TABS
300.0000 mg | ORAL_TABLET | Freq: Every day | ORAL | Status: DC
  Administered 2020-04-27 – 2020-04-29 (×3): 300 mg via ORAL
  Filled 2020-04-27 (×3): qty 1

## 2020-04-27 MED ORDER — SODIUM BICARBONATE 650 MG PO TABS
650.0000 mg | ORAL_TABLET | Freq: Three times a day (TID) | ORAL | Status: DC
  Filled 2020-04-27: qty 1

## 2020-04-27 NOTE — Progress Notes (Signed)
PROGRESS NOTE    Rachel Black  OMV:[id number] DOB: 1951-07-09 DOA: 04/26/2020 PCP: Rachel Reid, PA-C  Chief Complaint  Patient presents with  . Cough    Brief Narrative:  Rachel Black is Rachel Black 69 y.o. female with medical history significant of stroke and CAD, T2DM, hypertension, HLD, gout who presents with malaise, cough with recent reported positive COVID 19 testing.  She's had symptoms for about 1-2 weeks.  She notes she's been feeling drowsy, not hungry, and just out of it.  She's been generally weak.  She reported Rachel Black few episodes of vomiting Rachel Black few days ago, but this is now resolved.  Last night she fell out of bed.  She denies syncope or LOC to me.  Sounds like she was generally weak.  They had to call the paramedics who came and helped her back into bed.  Today, due to continued symptoms, she and her husband went to urgent care for testing and she was noted to be positive.  She was also hypotensive at urgent care initially.  She denies SOB, CP, abdominal pain, nausea, vomiting.  She notes decreased appetite.  She was sent from Urgent Care with hypotension, was given 200 cc fluid by EMS.  Denies smoking and etoh.   ED Course: Labs, EKG, IVF.  Pending COVID testing.  Admit for reported COVID and AKI.  Assessment & Plan:   Active Problems:   AKI (acute kidney injury) ([place])   COVID 19 Virus Infection  Multifocal Pneumonia 2/2 COVID 19 Pneumonia: Unvaccinated CXR with suspected early multifocal pneumonia CXR 8/14 with increasing infiltrates Admitted as PUI as we don't have positive test from UC Continue steroids/remdesivir, O2 sats <94% on RA recorded intermittently, but no oxygen requirement yet.  Procal resolved Elevated inflammatory markers - relatively stable, follow I/O, daily weights OOB, prone as able, IS, flutter valve, PT  COVID-19 Labs  Recent Labs    04/26/20 1402 04/27/20 0452  DDIMER 1.57* 1.59*  FERRITIN 746* 744*  LDH 182  --   CRP 7.0* 7.8*     Lab Results  Component Value Date   SARSCOV2NAA POSITIVE (Emaya Preston) 04/26/2020   Acute Kidney Injury: baseline creatinine 1.2-1.4.  Will continue IVF.  Suspect dehydration/hypovolemia 2/2 poor PO intake from covid above Follow UA - + RBC's, WBC's - appears c/w UTI Follow renal US - no hydro, hepatitic steatosis, mild splenomegaly Hold ARB Continue LR  NAGMA: change IVF to bicarb and follow, likely 2/2 AKI  Pyuria: contaminated with squams, looks like asx UTI, but she does have hematuria - will follow culture and plan to treat with hematuria Repeat UA and follow cx  Hypotension: suspect related to poor PO intake, hypovolemia, BP meds.  Follow with IVF.  Improved, no orthostatics done yet  Elevated D dimer: 2/2 covid infection, follow - stable.  If rising, consider additional w/u with LE [place] and r/o PE.  Thrombocytopenia: likely 2/2 covid infection  Hypertension: hold BP meds with hypotension above.  Holding imdur, benicar.   Mechanical Fall: suspect this was related to generalized weakness from COVID 19 infection.  Denies syncope or LOC.  Will follow therapy eval and hydration.  Follow orthostatics when able  Gout: resume reduced dose  HLD: crestor reduced dose.  Resume fenofibrate.    T2DM: SSI.  Hold janumet.  Continue ASA.  Hx Stroke  Hx CAD: continue asa. Crestor reduced dose.  Obesity Body mass index is 37.61 kg/m.   DVT prophylaxis: heparin Code Status: full  Family Communication: none  at bedside Disposition:   Status is: Inpatient  Remains inpatient appropriate because:Inpatient level of care appropriate due to severity of illness   Dispo: The patient is from: Home              Anticipated d/c is to: Home              Anticipated d/c date is: 2 days              Patient currently is not medically stable to d/c.  Consultants:   none  Procedures:  none  Antimicrobials:  Anti-infectives (From admission, onward)   Start     Dose/Rate Route  Frequency Ordered Stop   04/27/20 1000  remdesivir 100 mg in sodium chloride 0.9 % 100 mL IVPB     Discontinue    "Followed by" Linked Group Details   100 mg 200 mL/hr over 30 Minutes Intravenous Daily 04/26/20 1709 05/01/20 0959   04/26/20 1800  remdesivir 200 mg in sodium chloride 0.9% 250 mL IVPB       "Followed by" Linked Group Details   200 mg 580 mL/hr over 30 Minutes Intravenous Once 04/26/20 1709 04/26/20 1902      Subjective: No new complaints  Objective: Vitals:   04/27/20 1132 04/27/20 1342 04/27/20 1529 04/27/20 1700  BP: (!) 112/56 (!) 105/56 105/60 106/86  Pulse: 71 (!) 53 (!) 59 79  Resp: 20 16 20 16   Temp:      TempSrc:      SpO2: 90% 95% 95% 96%  Weight:      Height:       No intake or output data in the 24 hours ending 04/27/20 1811 Filed Weights   04/26/20 1352  Weight: 105.7 kg    Examination:  General exam: Appears calm and comfortable  Respiratory system: unlabored Cardiovascular system: S1 & S2 heard, RRR. Marland Kitchen Gastrointestinal system: Abdomen is nondistended, soft and nontender. Central nervous system: Alert and oriented. No focal neurological deficits. Extremities: no LEE Skin: No rashes, lesions or ulcers Psychiatry: Judgement and insight appear normal. Mood & affect appropriate.     Data Reviewed: I have personally reviewed following labs and imaging studies  CBC: Recent Labs  Lab 04/26/20 1402 04/27/20 0452  WBC 5.9 4.7  NEUTROABS 3.8 3.5  HGB 10.2* 10.1*  HCT 31.8* 31.4*  MCV 97.5 96.6  PLT 130* 120*    Basic Metabolic Panel: Recent Labs  Lab 04/26/20 1402 04/27/20 0452  NA 137 130*  K 4.2 4.2  CL 102 103  CO2 21* 15*  GLUCOSE 109* 153*  BUN 56* 49*  CREATININE 2.66* 1.85*  CALCIUM 9.0 8.6*  MG  --  2.2  PHOS  --  3.1    GFR: Estimated Creatinine Clearance: 36.3 mL/min (Raychelle Hudman) (by C-G formula based on SCr of 1.85 mg/dL (H)).  Liver Function Tests: Recent Labs  Lab 04/26/20 1402 04/27/20 0452  AST 42* 44*  ALT  19 20  ALKPHOS 36* 36*  BILITOT 0.7 0.7  PROT 6.2* 6.2*  ALBUMIN 3.4* 3.0*    CBG: Recent Labs  Lab 04/27/20 0812 04/27/20 1156  GLUCAP 143* 146*     Recent Results (from the past 240 hour(s))  Blood Culture (routine x 2)     Status: None (Preliminary result)   Collection Time: 04/26/20  2:02 PM   Specimen: BLOOD  Result Value Ref Range Status   Specimen Description   Final    BLOOD LEFT ANTECUBITAL Performed at [facility] [place] [address] [place], [place] [address]    Special Requests   Final    BOTTLES DRAWN AEROBIC AND ANAEROBIC Blood Culture results may not be optimal due to an excessive volume of blood received in culture bottles Performed at [place] [address] [place], [place] [address]    Culture   Final    NO GROWTH < 24 HOURS Performed at [place] [address], [place], [place] [address]    Report Status PENDING  Incomplete  Blood Culture (routine x 2)     Status: None (Preliminary result)   Collection Time: 04/26/20  2:07 PM   Specimen: BLOOD  Result Value Ref Range Status   Specimen Description   Final    BLOOD RIGHT ANTECUBITAL Performed at [place] [address], [place], [place] [address]    Special Requests   Final    BOTTLES DRAWN AEROBIC AND ANAEROBIC Blood Culture adequate volume Performed at [place] [address] [place], [place] [address]    Culture   Final    NO GROWTH < 24 HOURS Performed at [place] [address] [place], [place] [address]    Report Status PENDING  Incomplete  SARS Coronavirus 2 by RT PCR (hospital order, performed in [facility] hospital lab) Nasopharyngeal Nasopharyngeal Swab     Status: Abnormal   Collection Time: 04/26/20  2:51 PM   Specimen: Nasopharyngeal Swab  Result Value Ref Range Status   SARS Coronavirus 2 POSITIVE (Khara Renaud) NEGATIVE Final    Comment: RESULT CALLED TO, READ BACK BY AND  VERIFIED WITH: S.LEONARD AT 2026 ON 04/26/20 BY N.THOMPSON (NOTE) SARS-CoV-2 target nucleic acids are DETECTED  SARS-CoV-2 RNA is generally detectable in upper respiratory specimens  during the acute phase of infection.  Positive results are indicative  of the presence of the identified virus, but do not rule out bacterial infection or co-infection with other pathogens not detected by the test.  Clinical correlation with patient history and  other diagnostic information is necessary to determine patient infection status.  The expected result is negative.  Fact Sheet for Patients:   StrictlyIdeas.no   Fact Sheet for Healthcare Providers:   BankingDealers.co.za    This test is not yet approved or cleared by the [place] FDA and  has been authorized for detection and/or diagnosis of SARS-CoV-2 by FDA under an Emergency Use Authorization (EUA).  This EUA will remain in effect (meaning  this test can be used) for the duration of  the COVID-19 declaration under Section 564(b)(1) of the Act, 21 U.S.C. section 360-bbb-3(b)(1), unless the authorization is terminated or revoked sooner.  Performed at [facility], [place] [address], [place], [place] [address]          Radiology Studies: US RENAL  Result Date: 04/26/2020 CLINICAL DATA:  Acute kidney injury EXAM: RENAL / URINARY TRACT ULTRASOUND COMPLETE COMPARISON:  None. FINDINGS: Right Kidney: Renal measurements: 11.2 x 4.7 x 4.8 cm = volume: 138 mL . Echogenicity within normal limits. No mass or hydronephrosis visualized. Left Kidney: Renal measurements: 11.3 x 6.5 x 5.8 cm = volume: 220 mL. Echogenicity within normal limits. No mass or hydronephrosis visualized. Bladder: Appears normal for degree of bladder distention. Other: Increased echogenicity seen throughout the liver parenchyma. The spleen appears slightly enlarged measuring 14 cm. IMPRESSION: Normal appearing  kidneys and bladder Hepatic steatosis Mild splenomegaly Electronically Signed   By: Kerby Moors  Avutu M.D.   On: 04/26/2020 20:25   DG CHEST PORT 1 VIEW  Result Date: 04/27/2020 CLINICAL DATA:  COVID-19 positivity EXAM: PORTABLE CHEST 1 VIEW COMPARISON:  04/26/2020 FINDINGS: Cardiac shadow is enlarged but stable. Lungs are well aerated bilaterally. Patchy opacities are identified in both lungs left greater than right increased from the prior exam consistent with the given clinical history. No bony abnormality is noted. IMPRESSION: Increasing infiltrates bilaterally as described consistent with the given clinical history. Electronically Signed   By: Inez Catalina M.D.   On: 04/27/2020 04:53   DG Chest Port 1 View  Result Date: 04/26/2020 CLINICAL DATA:  Cough. Reported COVID-19 positive. Syncopal episode. EXAM: PORTABLE CHEST 1 VIEW COMPARISON:  December 08, 2017 FINDINGS: Atelectatic change noted in the left mid lung. Subtle increased opacity in the right upper and mid lung regions. No consolidation. Heart is borderline enlarged. With pulmonary vascularity normal. No adenopathy. No bone lesions. IMPRESSION: Ill-defined areas of airspace opacity in the right upper and right mid lung regions, likely early multifocal pneumonia. Atelectasis left mid lung. Suspect atypical organism pneumonia, particularly given the clinical history. Heart borderline enlarged.  No evident adenopathy. Electronically Signed   By: Lowella Grip III M.D.   On: 04/26/2020 14:55        Scheduled Meds: . vitamin C  500 mg Oral Daily  . aspirin EC  81 mg Oral Daily  . dexamethasone  6 mg Oral Daily  . fenofibrate  160 mg Oral Daily  . ferrous sulfate  325 mg Oral BID  . heparin  5,000 Units Subcutaneous Q8H  . insulin aspart  0-9 Units Subcutaneous TID WC  . linagliptin  5 mg Oral Daily  . pantoprazole  40 mg Oral Daily  . rosuvastatin  5 mg Oral Daily  . zinc sulfate  220 mg Oral Daily   Continuous Infusions: . lactated  ringers    . remdesivir 100 mg in NS 100 mL 100 mg (04/27/20 1016)     LOS: 1 day    Time spent: over 30 min    Fayrene Helper, MD [facility]   To contact the attending provider between 7A-7P or the covering provider during after hours 7P-7A, please log into the web site [email or web address] and access using universal [place] password for that web site. If you do not have the password, please call the hospital operator.  04/27/2020, 6:11 PM

## 2020-04-27 NOTE — ED Notes (Signed)
Unable to get urine sample due to contamination of stool.

## 2020-04-27 NOTE — Evaluation (Addendum)
Physical Therapy Evaluation Patient Details Name: Rachel Black MRN: [id number] DOB: [birth year] Today's Date: 04/27/2020   History of Present Illness  Rachel Black is a 69 y.o. female with medical history significant of stroke and CAD, T2DM, hypertension, HLD, gout who presents 11/25/19 with malaise, diarrhea, cough with recent reported positive COVID 19 testing.  Clinical Impression  The patient found sitting on stretcher edge. Assisted to [facility] then back to stretcher with min to min guard assistance. Patient not SOB, SPO2 95%. BP stable. Patient should progress to return home. May not require HHPT, will assess next visit. Pt admitted with above diagnosis.  Pt currently with functional limitations due to the deficits listed below (see PT Problem List). Pt will benefit from skilled PT to increase their independence and safety with mobility to allow discharge to the venue listed below.    Patient reports ambulatory with a few falls at home PTA, generally required no assistance for ADL's.     Follow Up Recommendations Home health PT (may not require)    Equipment Recommendations  None recommended by PT    Recommendations for Other Services       Precautions / Restrictions Precautions Precautions: Fall Precaution Comments: diarrhea urgency      Mobility  Bed Mobility Overal bed mobility: Needs Assistance Bed Mobility: Sit to Supine       Sit to supine: Min assist   General bed mobility comments: Patient sitting on stretcher edge, states waiting for bucket for BSC. Assist with legs onto bed  Transfers Overall transfer level: Needs assistance   Transfers: Sit to/from Stand;Stand Pivot Transfers Sit to Stand: Min assist Stand pivot transfers: Min assist       General transfer comment: assist to stand and pivot to [facility] then back to bed.  Ambulation/Gait             General Gait Details: NT  Stairs            Wheelchair Mobility    Modified Rankin (Stroke  Patients Only)       Balance Overall balance assessment: Needs assistance Sitting-balance support: Feet supported;No upper extremity supported Sitting balance-Leahy Scale: Good     Standing balance support: Single extremity supported;During functional activity Standing balance-Leahy Scale: Fair Standing balance comment: needs 1 UE for support to perform pericare when standing                             Pertinent Vitals/Pain Faces Pain Scale: No hurt    Home Living Family/patient expects to be discharged to:: Private residence   Available Help at Discharge: Family Type of Home: House Home Access: Stairs to enter Entrance Stairs-Rails: None Entrance Stairs-Number of Steps: 2 Home Layout: One level Home Equipment: Environmental consultant - 2 wheels;Cane - single point;Shower seat      Prior Function Level of Independence: Independent         Comments: does not use Rw in house very often     Hand Dominance   Dominant Hand: Right    Extremity/Trunk Assessment   Upper Extremity Assessment Upper Extremity Assessment: Generalized weakness    Lower Extremity Assessment Lower Extremity Assessment: Generalized weakness    Cervical / Trunk Assessment Cervical / Trunk Assessment: Normal  Communication   Communication: No difficulties  Cognition Arousal/Alertness: Awake/alert Behavior During Therapy: WFL for tasks assessed/performed Overall Cognitive Status: No family/caregiver present to determine baseline cognitive functioning Area of Impairment: Memory;Orientation  Orientation Level: Time             General Comments: stated"I don't know what day it is".      General Comments      Exercises     Assessment/Plan    PT Assessment Patient needs continued PT services  PT Problem List Decreased strength;Decreased mobility;Decreased safety awareness;Decreased knowledge of precautions;Decreased activity tolerance;Decreased  cognition;Decreased balance;Decreased knowledge of use of DME       PT Treatment Interventions DME instruction;Therapeutic activities;Gait training;Therapeutic exercise;Patient/family education;Functional mobility training    PT Goals (Current goals can be found in the Care Plan section)  Acute Rehab PT Goals Patient Stated Goal: to go home PT Goal Formulation: With patient Time For Goal Achievement: 05/11/20 Potential to Achieve Goals: Good    Frequency Min 3X/week   Barriers to discharge        Co-evaluation               AM-PAC PT "6 Clicks" Mobility  Outcome Measure Help needed turning from your back to your side while in a flat bed without using bedrails?: None Help needed moving from lying on your back to sitting on the side of a flat bed without using bedrails?: A Little Help needed moving to and from a bed to a chair (including a wheelchair)?: A Little Help needed standing up from a chair using your arms (e.g., wheelchair or bedside chair)?: A Little Help needed to walk in hospital room?: A Little Help needed climbing 3-5 steps with a railing? : A Lot 6 Click Score: 18    End of Session   Activity Tolerance: Patient tolerated treatment well Patient left: in bed;with call bell/phone within reach Nurse Communication: Mobility status PT Visit Diagnosis: Unsteadiness on feet (R26.81);Difficulty in walking, not elsewhere classified (R26.2)    Time: 1119-1140 PT Time Calculation (min) (ACUTE ONLY): 21 min   Charges:   PT Evaluation $PT Eval Low Complexity: [place] PT Acute Rehabilitation Services Pager [phone] Office [phone]   Claretha Cooper 04/27/2020, 12:39 PM

## 2020-04-28 ENCOUNTER — Inpatient Hospital Stay (HOSPITAL_COMMUNITY): Payer: BC Managed Care – PPO

## 2020-04-28 ENCOUNTER — Other Ambulatory Visit: Payer: Self-pay | Admitting: Family Medicine

## 2020-04-28 DIAGNOSIS — I10 Essential (primary) hypertension: Secondary | ICD-10-CM

## 2020-04-28 LAB — CBG MONITORING, ED
Glucose-Capillary: 98 mg/dL (ref 70–99)
Glucose-Capillary: 96 mg/dL (ref 70–99)
Glucose-Capillary: 140 mg/dL — ABNORMAL HIGH (ref 70–99)
Glucose-Capillary: 124 mg/dL — ABNORMAL HIGH (ref 70–99)

## 2020-04-28 LAB — FERRITIN: Ferritin: 837 ng/mL — ABNORMAL HIGH (ref 11–307)

## 2020-04-28 LAB — URINALYSIS, ROUTINE W REFLEX MICROSCOPIC
Bilirubin Urine: NEGATIVE
Glucose, UA: NEGATIVE mg/dL
Ketones, ur: NEGATIVE mg/dL
Nitrite: NEGATIVE
Protein, ur: NEGATIVE mg/dL
Specific Gravity, Urine: 1.024 (ref 1.005–1.030)
pH: 5 (ref 5.0–8.0)

## 2020-04-28 LAB — CBC WITH DIFFERENTIAL/PLATELET
Abs Immature Granulocytes: 0.11 10*3/uL — ABNORMAL HIGH (ref 0.00–0.07)
Basophils Absolute: 0 10*3/uL (ref 0.0–0.1)
Basophils Relative: 0 %
Eosinophils Absolute: 0 10*3/uL (ref 0.0–0.5)
Eosinophils Relative: 0 %
HCT: 30.3 % — ABNORMAL LOW (ref 36.0–46.0)
Hemoglobin: 9.9 g/dL — ABNORMAL LOW (ref 12.0–15.0)
Immature Granulocytes: 2 %
Lymphocytes Relative: 28 %
Lymphs Abs: 1.6 10*3/uL (ref 0.7–4.0)
MCH: 31.1 pg (ref 26.0–34.0)
MCHC: 32.7 g/dL (ref 30.0–36.0)
MCV: 95.3 fL (ref 80.0–100.0)
Monocytes Absolute: 0.3 10*3/uL (ref 0.1–1.0)
Monocytes Relative: 6 %
Neutro Abs: 3.6 10*3/uL (ref 1.7–7.7)
Neutrophils Relative %: 64 %
Platelets: 146 10*3/uL — ABNORMAL LOW (ref 150–400)
RBC: 3.18 MIL/uL — ABNORMAL LOW (ref 3.87–5.11)
RDW: 13.2 % (ref 11.5–15.5)
WBC: 5.6 10*3/uL (ref 4.0–10.5)
nRBC: 0 % (ref 0.0–0.2)

## 2020-04-28 LAB — C-REACTIVE PROTEIN: CRP: 4.4 mg/dL — ABNORMAL HIGH (ref <1.0)

## 2020-04-28 LAB — COMPREHENSIVE METABOLIC PANEL
ALT: 21 U/L (ref 0–44)
AST: 41 U/L (ref 15–41)
Albumin: 2.8 g/dL — ABNORMAL LOW (ref 3.5–5.0)
Alkaline Phosphatase: 33 U/L — ABNORMAL LOW (ref 38–126)
Anion gap: 11 (ref 5–15)
BUN: 44 mg/dL — ABNORMAL HIGH (ref 8–23)
CO2: 24 mmol/L (ref 22–32)
Calcium: 8.9 mg/dL (ref 8.9–10.3)
Chloride: 104 mmol/L (ref 98–111)
Creatinine, Ser: 1.29 mg/dL — ABNORMAL HIGH (ref 0.44–1.00)
GFR calc Af Amer: 50 mL/min — ABNORMAL LOW (ref >60)
GFR calc non Af Amer: 43 mL/min — ABNORMAL LOW (ref >60)
Glucose, Bld: 117 mg/dL — ABNORMAL HIGH (ref 70–99)
Potassium: 3.9 mmol/L (ref 3.5–5.1)
Sodium: 139 mmol/L (ref 135–145)
Total Bilirubin: 0.5 mg/dL (ref 0.3–1.2)
Total Protein: 5.8 g/dL — ABNORMAL LOW (ref 6.5–8.1)

## 2020-04-28 LAB — MAGNESIUM: Magnesium: 1.9 mg/dL (ref 1.7–2.4)

## 2020-04-28 LAB — D-DIMER, QUANTITATIVE: D-Dimer, Quant: 1.51 ug/mL-FEU — ABNORMAL HIGH (ref 0.00–0.50)

## 2020-04-28 LAB — BRAIN NATRIURETIC PEPTIDE: B Natriuretic Peptide: 37.5 pg/mL (ref 0.0–100.0)

## 2020-04-28 MED ORDER — TECHNETIUM TO 99M ALBUMIN AGGREGATED
4.4000 | Freq: Once | INTRAVENOUS | Status: AC | PRN
  Administered 2020-04-28: 4.4 via INTRAVENOUS

## 2020-04-28 NOTE — ED Notes (Signed)
Pt ambulating in room, no acute distress noted

## 2020-04-28 NOTE — Evaluation (Signed)
Occupational Therapy Evaluation Patient Details Name: Rachel Black MRN: [id number] DOB: 1951/07/29 Today's Date: 04/28/2020    History of Present Illness Rachel Black is a 69 y.o. female with medical history significant of stroke and CAD, T2DM, hypertension, HLD, gout who presents 11/25/19 with malaise, diarrhea, cough with recent reported positive COVID 19 testing.   Clinical Impression   Mrs. Rachel Black is a 69 year old woman positive with COVID who presents with generalized weakness, decreased activity tolerance, mild left sided hemiparesis and impaired balance resulting in decreased independence with ADLs and mobility. On evaluation patient min assist for ambulation and min-mod assist for ADLs. Patient's cognition is impaired with deficits of memory, difficulty describing and detailing her abilities and prior level and mild word finding difficulties. Unsure of baseline cognition or if deficits s/p prior CVA. Patient on room air and o2 sats maintained above 90% with limited activity. Patient's initial HR 127 when therapist entered the room and patient sitting at edge of stretcher but decreased to 83 while supine. Patient will benefit from skilled OT services while in hospital in order to improve deficits and learn compensatory strategies as needed in order to return PLOF.     Follow Up Recommendations  Home health OT    Equipment Recommendations  None recommended by OT    Recommendations for Other Services       Precautions / Restrictions Precautions Precautions: Fall      Mobility Bed Mobility               General bed mobility comments: Patient sitting on stretcher edge.  Able to transfer back into supine with min guard.  Transfers Overall transfer level: Needs assistance Equipment used: 1 person hand held assist Transfers: Sit to/from [facility] Sit to Stand: Min assist Stand pivot transfers: Min assist       General transfer comment: Min  assist to stand and hand hold assist to walk up to head of stretcher.    Balance Overall balance assessment: Needs assistance Sitting-balance support: Feet supported;No upper extremity supported Sitting balance-Leahy Scale: Good     Standing balance support: Single extremity supported;During functional activity Standing balance-Leahy Scale: Fair                             ADL either performed or assessed with clinical judgement   ADL Overall ADL's : Needs assistance/impaired Eating/Feeding: Independent   Grooming: Set up;Sitting   Upper Body Bathing: Set up;Sitting   Lower Body Bathing: Minimal assistance;Sit to/from stand   Upper Body Dressing : Set up;Sitting   Lower Body Dressing: Moderate assistance;Set up   Toilet Transfer: Minimal assistance;Stand-pivot;BSC   Toileting- Clothing Manipulation and Hygiene: Minimal assistance;Sit to/from stand               Vision   Vision Assessment?: No apparent visual deficits     Perception     Praxis      Pertinent Vitals/Pain Pain Assessment: No/denies pain     Hand Dominance Left   Extremity/Trunk Assessment Upper Extremity Assessment Upper Extremity Assessment: RUE deficits/detail;LUE deficits/detail RUE Deficits / Details: Shoulder 3+/5, elbow 4/5, wrist 5/5, grip 4/5 RUE Sensation: WNL RUE Coordination: WNL LUE Deficits / Details: shoulder 3+/5, elbow 4-/5, wrist (not tested due to IV), grip 4-/5 LUE Coordination: decreased fine motor (reports history of decreased [place] and use of left hand.)   Lower Extremity Assessment Lower Extremity Assessment: Defer to PT evaluation  Cervical / Trunk Assessment Cervical / Trunk Assessment: Normal   Communication Communication Communication: No difficulties   Cognition Arousal/Alertness: Awake/alert Behavior During Therapy: WFL for tasks assessed/performed Overall Cognitive Status: No family/caregiver present to determine baseline cognitive  functioning Area of Impairment: Memory;Orientation;Safety/judgement                 Orientation Level: Time             General Comments: Patient's ability to be detailed or specific is limited. Unknown prior cognition.   General Comments       Exercises     Shoulder Instructions      Home Living Family/patient expects to be discharged to:: Private residence Living Arrangements: Spouse/significant other Available Help at Discharge: Family Type of Home: House Home Access: Stairs to enter Technical brewer of Steps: 2 Entrance Stairs-Rails: None Home Layout: One level     Bathroom Shower/Tub: Occupational psychologist: [place]: Environmental consultant - 2 wheels;Cane - single point;Shower seat          Prior Functioning/Environment Level of Independence: Independent        Comments: does not use Rw in house very often        OT Problem List: Decreased strength;Decreased activity tolerance;Impaired balance (sitting and/or standing);Decreased safety awareness;Decreased cognition      OT Treatment/Interventions: Self-care/ADL training;DME and/or AE instruction;Therapeutic exercise;Neuromuscular education;Patient/family education;Balance training;Therapeutic activities;Energy conservation    OT Goals(Current goals can be found in the care plan section) Acute Rehab OT Goals Patient Stated Goal: to go home OT Goal Formulation: With patient Time For Goal Achievement: 05/12/20 Potential to Achieve Goals: Good  OT Frequency: Min 2X/week   Barriers to D/C:            Co-evaluation              AM-PAC OT "6 Clicks" Daily Activity     Outcome Measure Help from another person eating meals?: None Help from another person taking care of personal grooming?: A Little Help from another person toileting, which includes using toliet, bedpan, or urinal?: A Little Help from another person bathing (including washing, rinsing, drying)?: A  Lot Help from another person to put on and taking off regular upper body clothing?: A Little Help from another person to put on and taking off regular lower body clothing?: A Lot 6 Click Score: 17   End of Session Nurse Communication:  (cognition)  Activity Tolerance: Patient tolerated treatment well Patient left: in bed;with call bell/phone within reach  OT Visit Diagnosis: Muscle weakness (generalized) (M62.81);Unsteadiness on feet (R26.81);History of falling (Z91.81)                Time: 7673-4193 OT Time Calculation (min): 21 min Charges:  OT General Charges $OT Visit: 1 Visit OT Evaluation $OT Eval Low Complexity: 1 Low  Aurie Harroun, OTR/L [place]  Office [phone] Pager: [place] 04/28/2020, 5:44 PM

## 2020-04-28 NOTE — ED Notes (Signed)
Patient ambulated with oxygen sat remain at 93-94% on room air.

## 2020-04-28 NOTE — ED Notes (Signed)
Pt unable to stand up to get orthostaticvitals

## 2020-04-28 NOTE — Progress Notes (Signed)
PROGRESS NOTE    Rachel Black  DUK:[id number] DOB: 27-Jul-1951 DOA: 04/26/2020 PCP: Lorrene Reid, PA-C  Chief Complaint  Patient presents with   Cough    Brief Narrative:  Rachel Black is Rachel Black 69 y.o. female with medical history significant of stroke and CAD, T2DM, hypertension, HLD, gout who presents with malaise, cough with recent reported positive COVID 19 testing.  She's had symptoms for about 1-2 weeks.  She notes she's been feeling drowsy, not hungry, and just out of it.  She's been generally weak.  She reported Mairlyn Tegtmeyer few episodes of vomiting Kinnedy Mongiello few days ago, but this is now resolved.  Last night she fell out of bed.  She denies syncope or LOC to me.  Sounds like she was generally weak.  They had to call the paramedics who came and helped her back into bed.  Today, due to continued symptoms, she and her husband went to urgent care for testing and she was noted to be positive.  She was also hypotensive at urgent care initially.  She denies SOB, CP, abdominal pain, nausea, vomiting.  She notes decreased appetite.  She was sent from Urgent Care with hypotension, was given 200 cc fluid by EMS.  Denies smoking and etoh.   ED Course: Labs, EKG, IVF.  Pending COVID testing.  Admit for reported COVID and AKI.  Assessment & Plan:   Active Problems:   AKI (acute kidney injury) ([place])   COVID 19 Virus Infection   Multifocal Pneumonia 2/2 COVID 19 Pneumonia: Unvaccinated CXR with suspected early multifocal pneumonia CXR 8/14 with increasing infiltrates Continue steroids/remdesivir, O2 sats <94% on RA recorded intermittently, but no oxygen requirement yet.  Procal resolved Elevated inflammatory markers - relatively stable, follow I/O, daily weights OOB, prone as able, IS, flutter valve, PT For elevated d dimer, check LE [place] and VQ scan prior to d/c.  COVID-19 Labs  Recent Labs    04/26/20 1402 04/27/20 0452 04/28/20 0500  DDIMER 1.57* 1.59* 1.51*  FERRITIN 746* 744* 837*  LDH  182  --   --   CRP 7.0* 7.8* 4.4*    Lab Results  Component Value Date   SARSCOV2NAA POSITIVE ([place]) 04/26/2020   Acute Kidney Injury: baseline creatinine 1.2-1.4.  Will continue IVF.  Suspect dehydration/hypovolemia 2/2 poor PO intake from covid above Follow UA - + RBC's, WBC's - appears c/w UTI Follow renal US - no hydro, hepatitic steatosis, mild splenomegaly Hold ARB Improved to baseline  NAGMA: change IVF to bicarb and follow, likely 2/2 AKI  Pyuria   Hematuria: contaminated with squams, looks like asx UTI, but she does have hematuria - will follow culture and plan to treat with hematuria Follow cx Hematuria resolved on repeat, follow cx No need for treatment with asx and improved hematuria  Hypotension: suspect related to poor PO intake, hypovolemia, BP meds.  Follow with IVF.  Improved, no orthostatics done yet  Elevated D dimer: 2/2 covid infection, follow - stable.  Negative VQ scan, pending LE [place].  Thrombocytopenia: likely 2/2 covid infection  Hypertension: hold BP meds with hypotension above.  Holding imdur, benicar.   Mechanical Fall: suspect this was related to generalized weakness from COVID 19 infection.  Denies syncope or LOC.  Will follow therapy eval and hydration.   Gout: resume reduced dose  HLD: crestor reduced dose.  Resume fenofibrate.    T2DM: SSI.  Hold janumet.  Continue ASA.  Hx Stroke   Hx CAD: continue asa. Crestor reduced dose.  Obesity Body mass index is 37.61 kg/m.   DVT prophylaxis: heparin Code Status: full  Family Communication: none at bedside Disposition:   Status is: Inpatient  Remains inpatient appropriate because:Inpatient level of care appropriate due to severity of illness   Dispo: The patient is from: Home              Anticipated d/c is to: Home              Anticipated d/c date is: 2 days              Patient currently is not medically stable to d/c.  Consultants:   none  Procedures:   none  Antimicrobials:  Anti-infectives (From admission, onward)   Start     Dose/Rate Route Frequency Ordered Stop   04/27/20 1000  remdesivir 100 mg in sodium chloride 0.9 % 100 mL IVPB     Discontinue    "Followed by" Linked Group Details   100 mg 200 mL/hr over 30 Minutes Intravenous Daily 04/26/20 1709 05/01/20 0959   04/26/20 1800  remdesivir 200 mg in sodium chloride 0.9% 250 mL IVPB       "Followed by" Linked Group Details   200 mg 580 mL/hr over 30 Minutes Intravenous Once 04/26/20 1709 04/26/20 1902      Subjective: No new complaints  Objective: Vitals:   04/28/20 1131 04/28/20 1449 04/28/20 1501 04/28/20 1521  BP: (!) 145/71 (!) 146/84 134/75 134/75  Pulse: 72 68  67  Resp: 18 18  18   Temp: 98.5 F (36.9 C)     TempSrc: Oral     SpO2: 96% 99%  98%  Weight:      Height:        Intake/Output Summary (Last 24 hours) at 04/28/2020 1617 Last data filed at 04/28/2020 7782 Gross per 24 hour  Intake 1216.89 ml  Output --  Net 1216.89 ml   Filed Weights   04/26/20 1352  Weight: 105.7 kg    Examination:  General: No acute distress. Cardiovascular: RRR Lungs: unlabored Abdomen: Soft, nontender, nondistended Neurological: Alert and oriented 3. Moves all extremities 4. Cranial nerves II through XII grossly intact. Skin: Warm and dry. No rashes or lesions. Extremities: No clubbing or cyanosis. No edema.   Data Reviewed: I have personally reviewed following labs and imaging studies  CBC: Recent Labs  Lab 04/26/20 1402 04/27/20 0452 04/28/20 0500  WBC 5.9 4.7 5.6  NEUTROABS 3.8 3.5 3.6  HGB 10.2* 10.1* 9.9*  HCT 31.8* 31.4* 30.3*  MCV 97.5 96.6 95.3  PLT 130* 120* 146*    Basic Metabolic Panel: Recent Labs  Lab 04/26/20 1402 04/27/20 0452 04/28/20 0500  NA 137 130* 139  K 4.2 4.2 3.9  CL 102 103 104  CO2 21* 15* 24  GLUCOSE 109* 153* 117*  BUN 56* 49* 44*  CREATININE 2.66* 1.85* 1.29*  CALCIUM 9.0 8.6* 8.9  MG  --  2.2 1.9  PHOS  --   3.1  --     GFR: Estimated Creatinine Clearance: 52 mL/min ([place]) (by C-G formula based on SCr of 1.29 mg/dL (H)).  Liver Function Tests: Recent Labs  Lab 04/26/20 1402 04/27/20 0452 04/28/20 0500  AST 42* 44* 41  ALT 19 20 21   ALKPHOS 36* 36* 33*  BILITOT 0.7 0.7 0.5  PROT 6.2* 6.2* 5.8*  ALBUMIN 3.4* 3.0* 2.8*    CBG: Recent Labs  Lab 04/27/20 0812 04/27/20 1156 04/27/20 1901 04/28/20 0831 04/28/20 1135  GLUCAP  143* 146* 170* 98 96     Recent Results (from the past 240 hour(s))  Blood Culture (routine x 2)     Status: None (Preliminary result)   Collection Time: 04/26/20  2:02 PM   Specimen: BLOOD  Result Value Ref Range Status   Specimen Description   Final    BLOOD LEFT ANTECUBITAL Performed at [facility] [address], [place], [place] [address]    Special Requests   Final    BOTTLES DRAWN AEROBIC AND ANAEROBIC Blood Culture results may not be optimal due to an excessive volume of blood received in culture bottles Performed at [facility] [address] [place], [place] [address]    Culture   Final    NO GROWTH 2 DAYS Performed at [facility] [address] [place], [place] [address]    Report Status PENDING  Incomplete  Blood Culture (routine x 2)     Status: None (Preliminary result)   Collection Time: 04/26/20  2:07 PM   Specimen: BLOOD  Result Value Ref Range Status   Specimen Description   Final    BLOOD RIGHT ANTECUBITAL Performed at [facility] [address] [place], [place] [address]    Special Requests   Final    BOTTLES DRAWN AEROBIC AND ANAEROBIC Blood Culture adequate volume Performed at [facility] [address] [place], [place] [address]    Culture   Final    NO GROWTH 2 DAYS Performed at [facility] [address], [place], [place] [address]    Report Status PENDING  Incomplete  SARS Coronavirus 2 by RT PCR (hospital order,  performed in [facility] hospital lab) Nasopharyngeal Nasopharyngeal Swab     Status: Abnormal   Collection Time: 04/26/20  2:51 PM   Specimen: Nasopharyngeal Swab  Result Value Ref Range Status   SARS Coronavirus 2 POSITIVE ([place]ia [place]) NEGATIVE Final    Comment: RESULT CALLED TO, READ BACK BY AND VERIFIED WITH: S.LEONARD AT 2026 ON 04/26/20 BY N.THOMPSON (NOTE) SARS-CoV-2 target nucleic acids are DETECTED  SARS-CoV-2 RNA is generally detectable in upper respiratory specimens  during the acute phase of infection.  Positive results are indicative  of the presence of the identified virus, but do not rule out bacterial infection or co-infection with other pathogens not detected by the test.  Clinical correlation with patient history and  other diagnostic information is necessary to determine patient infection status.  The expected result is negative.  Fact Sheet for Patients:   StrictlyIdeas.no   Fact Sheet for Healthcare Providers:   BankingDealers.co.za    This test is not yet approved or cleared by the [place] FDA and  has been authorized for detection and/or diagnosis of SARS-CoV-2 by FDA under an Emergency Use Authorization (EUA).  This EUA will remain in effect (meaning  this test can be used) for the duration of  the COVID-19 declaration under Section 564(b)(1) of the Act, 21 U.S.C. section 360-bbb-3(b)(1), unless the authorization is terminated or revoked sooner.  Performed at [facility], [place] [address], [place], [place] [address]          Radiology Studies: NM Pulmonary Perfusion  Result Date: 04/28/2020 CLINICAL DATA:  Shortness of breath, positive D-dimer, COVID-19 pneumonia and acute kidney injury. EXAM: NUCLEAR MEDICINE PERFUSION LUNG SCAN TECHNIQUE: Perfusion images were obtained in multiple projections after intravenous injection of radiopharmaceutical. Ventilation scans intentionally deferred if  perfusion scan and  chest x-ray adequate for interpretation during COVID 19 epidemic. RADIOPHARMACEUTICALS:  4.4 mCi Tc-66mMAA IV COMPARISON:  Chest x-ray on 04/27/2020 FINDINGS: Perfusion imaging in 8 different projections demonstrates normal perfusion of both lungs with no significant perfusion defects. No evidence to suggest significant pulmonary embolism. IMPRESSION: Normal nuclear medicine pulmonary perfusion imaging demonstrating no perfusion defects and no evidence of pulmonary embolism. Electronically Signed   By: GAletta EdouardM.D.   On: 04/28/2020 15:33   [place]  Result Date: 04/26/2020 CLINICAL DATA:  Acute kidney injury EXAM: RENAL / URINARY TRACT ULTRASOUND COMPLETE COMPARISON:  None. FINDINGS: Right Kidney: Renal measurements: 11.2 x 4.7 x 4.8 cm = volume: 138 mL . Echogenicity within normal limits. No mass or hydronephrosis visualized. Left Kidney: Renal measurements: 11.3 x 6.5 x 5.8 cm = volume: 220 mL. Echogenicity within normal limits. No mass or hydronephrosis visualized. Bladder: Appears normal for degree of bladder distention. Other: Increased echogenicity seen throughout the liver parenchyma. The spleen appears slightly enlarged measuring 14 cm. IMPRESSION: Normal appearing kidneys and bladder Hepatic steatosis Mild splenomegaly Electronically Signed   By: BPrudencio PairM.D.   On: 04/26/2020 20:25   DG CHEST PORT 1 VIEW  Result Date: 04/27/2020 CLINICAL DATA:  COVID-19 positivity EXAM: PORTABLE CHEST 1 VIEW COMPARISON:  04/26/2020 FINDINGS: Cardiac shadow is enlarged but stable. Lungs are well aerated bilaterally. Patchy opacities are identified in both lungs left greater than right increased from the prior exam consistent with the given clinical history. No bony abnormality is noted. IMPRESSION: Increasing infiltrates bilaterally as described consistent with the given clinical history. Electronically Signed   By: MInez CatalinaM.D.   On: 04/27/2020 04:53        Scheduled  Meds:  allopurinol  300 mg Oral Daily   vitamin C  500 mg Oral Daily   aspirin EC  81 mg Oral Daily   dexamethasone  6 mg Oral Daily   fenofibrate  160 mg Oral Daily   ferrous sulfate  325 mg Oral BID   heparin  5,000 Units Subcutaneous Q8H   insulin aspart  0-9 Units Subcutaneous TID WC   linagliptin  5 mg Oral Daily   pantoprazole  40 mg Oral Daily   rosuvastatin  5 mg Oral Daily   zinc sulfate  220 mg Oral Daily   Continuous Infusions:  remdesivir 100 mg in NS 100 mL Stopped (04/28/20 1125)    sodium bicarbonate (isotonic) infusion in sterile water 125 mL/hr at 04/28/20 0537     LOS: 2 days    Time spent: over 30 min    CFayrene Helper MD [facility]   To contact the attending provider between 7A-7P or the covering provider during after hours 7P-7A, please log into the web site [email or web address] and access using universal [place] password for that web site. If you do not have the password, please call the hospital operator.  04/28/2020, 4:17 PM

## 2020-04-29 ENCOUNTER — Inpatient Hospital Stay (HOSPITAL_COMMUNITY): Payer: BC Managed Care – PPO

## 2020-04-29 DIAGNOSIS — R7989 Other specified abnormal findings of blood chemistry: Secondary | ICD-10-CM

## 2020-04-29 LAB — CBC WITH DIFFERENTIAL/PLATELET
Abs Immature Granulocytes: 0.15 10*3/uL — ABNORMAL HIGH (ref 0.00–0.07)
Basophils Absolute: 0 10*3/uL (ref 0.0–0.1)
Basophils Relative: 0 %
Eosinophils Absolute: 0 10*3/uL (ref 0.0–0.5)
Eosinophils Relative: 0 %
HCT: 34.1 % — ABNORMAL LOW (ref 36.0–46.0)
Hemoglobin: 10.9 g/dL — ABNORMAL LOW (ref 12.0–15.0)
Immature Granulocytes: 3 %
Lymphocytes Relative: 27 %
Lymphs Abs: 1.3 10*3/uL (ref 0.7–4.0)
MCH: 31.2 pg (ref 26.0–34.0)
MCHC: 32 g/dL (ref 30.0–36.0)
MCV: 97.7 fL (ref 80.0–100.0)
Monocytes Absolute: 0.3 10*3/uL (ref 0.1–1.0)
Monocytes Relative: 6 %
Neutro Abs: 3 10*3/uL (ref 1.7–7.7)
Neutrophils Relative %: 64 %
Platelets: 169 10*3/uL (ref 150–400)
RBC: 3.49 MIL/uL — ABNORMAL LOW (ref 3.87–5.11)
RDW: 13.3 % (ref 11.5–15.5)
WBC: 4.7 10*3/uL (ref 4.0–10.5)
nRBC: 0 % (ref 0.0–0.2)

## 2020-04-29 LAB — COMPREHENSIVE METABOLIC PANEL
ALT: 24 U/L (ref 0–44)
AST: 49 U/L — ABNORMAL HIGH (ref 15–41)
Albumin: 3.1 g/dL — ABNORMAL LOW (ref 3.5–5.0)
Alkaline Phosphatase: 38 U/L (ref 38–126)
Anion gap: 15 (ref 5–15)
BUN: 38 mg/dL — ABNORMAL HIGH (ref 8–23)
CO2: 22 mmol/L (ref 22–32)
Calcium: 9.1 mg/dL (ref 8.9–10.3)
Chloride: 105 mmol/L (ref 98–111)
Creatinine, Ser: 1.41 mg/dL — ABNORMAL HIGH (ref 0.44–1.00)
GFR calc Af Amer: 45 mL/min — ABNORMAL LOW (ref >60)
GFR calc non Af Amer: 38 mL/min — ABNORMAL LOW (ref >60)
Glucose, Bld: 86 mg/dL (ref 70–99)
Potassium: 3.9 mmol/L (ref 3.5–5.1)
Sodium: 142 mmol/L (ref 135–145)
Total Bilirubin: 0.9 mg/dL (ref 0.3–1.2)
Total Protein: 6.1 g/dL — ABNORMAL LOW (ref 6.5–8.1)

## 2020-04-29 LAB — URINE CULTURE: Culture: >=100000 — AB

## 2020-04-29 LAB — CBG MONITORING, ED
Glucose-Capillary: 89 mg/dL (ref 70–99)
Glucose-Capillary: 122 mg/dL — ABNORMAL HIGH (ref 70–99)
Glucose-Capillary: 150 mg/dL — ABNORMAL HIGH (ref 70–99)

## 2020-04-29 LAB — D-DIMER, QUANTITATIVE: D-Dimer, Quant: 1.3 ug/mL-FEU — ABNORMAL HIGH (ref 0.00–0.50)

## 2020-04-29 LAB — C-REACTIVE PROTEIN: CRP: 3.6 mg/dL — ABNORMAL HIGH (ref <1.0)

## 2020-04-29 LAB — FERRITIN: Ferritin: 1050 ng/mL — ABNORMAL HIGH (ref 11–307)

## 2020-04-29 LAB — MAGNESIUM: Magnesium: 1.9 mg/dL (ref 1.7–2.4)

## 2020-04-29 MED ORDER — DEXAMETHASONE 6 MG PO TABS: 6.0000 mg | ORAL_TABLET | Freq: Every day | ORAL | 0 refills | Status: AC

## 2020-04-29 NOTE — Progress Notes (Signed)
Bilateral lower extremity venous duplex has been completed. Preliminary results can be found in CV Proc through chart review.   04/29/20 11:57 AM Rachel Black RVT

## 2020-04-29 NOTE — Discharge Instructions (Signed)
You are scheduled for a Remdesivir infusion on 8/17 at 2pm. Please come to [facility], you will see a COVID infusion banner by the road.  Enter there and turn left.  There are marked spaces for Infusion. Call the number on the sign or [phone] and someone will come out and bring you inside. If someone is driving you please come to the same area and call the number and someone will come outside to get you. Thank you!

## 2020-04-29 NOTE — Discharge Summary (Signed)
Physician Discharge Summary  Rachel Black UDJ:[id number] DOB: 06-23-1951 DOA: 04/26/2020  PCP: Lorrene Reid, PA-C  Admit date: 04/26/2020 Discharge date: 04/29/2020  Time spent: 40 minutes  Recommendations for Outpatient Follow-up:  1. Follow outpatient CBC/CMP 2. Follow final dose of remdesivir outpatient 8/17 3. Continue quarantine per CDC guidelines (05/17/2020) 4. Holding benicar until pt can follow with outpatient PCP to follow renal function and BP outpatient  5. Follow CXR in 3-4 weeks 6. Follow steatosis, splenomegaly outpatient   Discharge Diagnoses:  Active Problems:   AKI (acute kidney injury) [facility])  Discharge Condition: stable  Diet recommendation: heart healthy  Filed Weights   04/26/20 1352  Weight: 105.7 kg   History of present illness:  Rachel Black Rachel Black 69 y.o.femalewith medical history significant ofstroke and CAD, T2DM, hypertension, HLD, gout who presents with malaise, cough with recent reported positive COVID 19 testing.  She's had symptoms for about 1-2 weeks. She notes she's been feeling drowsy, not hungry, and just out of it. She's been generally weak. She reported Lynsee Wands few episodes of vomiting Lotoya Casella few days ago, but this is now resolved. Last night she fell out of bed. She denies syncope or LOC to me. Sounds like she was generally weak. They had to call the paramedics who came and helped her back into bed. Today, due to continued symptoms, she and her husband went to urgent care for testing and she was noted to be positive. She was also hypotensive at urgent care initially. She denies SOB, CP, abdominal pain, nausea, vomiting. She notes decreased appetite. She was sent from Urgent Care with hypotension, was given 200 cc fluid by EMS. Denies smoking and etoh.   ED Course:Labs, EKG, IVF. Pending COVID testing. Admit for reported COVID and AKI.  She was admitted for Kaegan Hettich COVID 19 virus infection with pneumonia and Cassi Jenne mechanical fall.  She's  improved with IVF, steroids, and remdesivir.  Planning for discharge today to complete remdesivir outpatient as well as steroids.  Home health arranged.    Hospital Course:   COVID 19 Virus Infection  Multifocal Pneumonia 2/2 COVID 19 Pneumonia: Unvaccinated CXR with suspected early multifocal pneumonia CXR 8/14 with increasing infiltrates CXR 8/16 with multifocal pneumonia  Doing well on RA at time of discharge, will discharge to complete remdesivir outpatient as well as steroids Procal resolved Elevated inflammatory markers - relatively stable, follow I/O, daily weights OOB, prone as able, IS, flutter valve, PT  COVID-19 Labs  Recent Labs    04/27/20 0452 04/28/20 0500 04/29/20 0458  DDIMER 1.59* 1.51* 1.30*  FERRITIN 744* 837* 1,050*  CRP 7.8* 4.4* 3.6*    Lab Results  Component Value Date   SARSCOV2NAA POSITIVE (Bemnet Trovato) 04/26/2020    Acute Kidney Injury: baseline creatinine 1.2-1.4. Will continue IVF. Suspect dehydration/hypovolemia 2/2 poor PO intake from covid above Follow UA - + RBC's, WBC's - appears c/w UTI -> hematuria/proteinuria resolved with repeat UA Follow renal US - no hydro, hepatitic steatosis, mild splenomegaly Improved, continue to hold ARB at d/c until follow up  NAGMA: improved  Pyuria: repeat UA without significant hematuria/proteinuria - multiple species on urine cx - she's asymptomatic, follow for sx  Hypotension: improved, follow outpatient   Elevated D dimer: negative VQ scan and LE [place]  Thrombocytopenia: likely 2/2 covid infection  Hypertension: hold BP meds with hypotension above. Holding imdur, benicar.   Mechanical Fall: suspect this was related to generalized weakness from COVID 19 infection. Denies syncope or LOC. Will follow therapy eval and  hydration.  Home health   Gout: resume allopurinol  HLD: crestor reduced dose. Resume fenofibrate.   T2DM: resume home meds  Hx Stroke  Hx CAD: continue asa.  crestor  Obesity Body mass index is 37.61 kg/m.  Procedures: LE [place] Summary:  RIGHT:  - There is no evidence of deep vein thrombosis in the lower extremity.  However, portions of this examination were limited- see technologist  comments above.    - No cystic structure found in the popliteal fossa.    LEFT:  - There is no evidence of deep vein thrombosis in the lower extremity.  However, portions of this examination were limited- see technologist  comments above.    - No cystic structure found in the popliteal fossa.   Consultations:  none  Discharge Exam: Vitals:   04/29/20 1334 04/29/20 1517  BP:  113/63  Pulse: 98 75  Resp:  (!) 22  Temp:    SpO2: 93% 90%   No new complaints today Discussed d/c plan with pt/husband  General: No acute distress. Cardiovascular: Heart sounds show Teesha Ohm regular rate, and rhythm. Lungs: Clear to auscultation bilaterally  Abdomen: Soft, nontender, nondistended  Neurological: Alert and oriented 3. Moves all extremities 4 . Cranial nerves II through XII grossly intact. Skin: Warm and dry. No rashes or lesions. Extremities: No clubbing or cyanosis. No edema.   Discharge Instructions   Discharge Instructions    Call MD for:  difficulty breathing, headache or visual disturbances   Complete by: As directed    Call MD for:  extreme fatigue   Complete by: As directed    Call MD for:  hives   Complete by: As directed    Call MD for:  persistant dizziness or light-headedness   Complete by: As directed    Call MD for:  persistant nausea and vomiting   Complete by: As directed    Call MD for:  redness, tenderness, or signs of infection (pain, swelling, redness, odor or green/yellow discharge around incision site)   Complete by: As directed    Call MD for:  severe uncontrolled pain   Complete by: As directed    Call MD for:  temperature >100.4   Complete by: As directed    Diet - low sodium heart healthy   Complete by: As directed     Diet - low sodium heart healthy   Complete by: As directed    Discharge instructions   Complete by: As directed    You were seen Carlissa Pesola COVID 19 infection.  I suspect that you were dehydrated on presentation which probably contributed to your fall and kidney failure initially.  Your kidney function is now improved.  Please hold your benicar until you can follow up with your PCP as an outpatient and repeat labs to check your kidney function and follow up your blood pressure.  You'll come back tomorrow for another dose of remdesivir.  We'll send you home with 5 days of steroids.    You need to quarantine until 05/17/2020.  You can discontinue quarantine after you've improved without fever or using fever reducing meds.  Return for new, recurrent, or worsening symptoms.  Please ask your PCP to request records from this hospitalization so they know what was done and what the next steps will be.   Increase activity slowly   Complete by: As directed      Allergies as of 04/29/2020      Reactions   Other Other (See Comments)  SOME BANDAIDS CAUSE SKIN IRRITATION   Penicillins Itching   Has patient had Isahi Godwin PCN reaction causing immediate rash, facial/tongue/throat swelling, SOB or lightheadedness with hypotension: No Has patient had Naziah Weckerly PCN reaction causing severe rash involving mucus membranes or skin necrosis: No Has patient had Koleson Reifsteck PCN reaction that required hospitalization No Has patient had Coen Miyasato PCN reaction occurring within the last 10 years: No If all of the above answers are "NO", then may proceed with Cephalosporin use.      Medication List    STOP taking these medications   olmesartan 5 MG tablet Commonly known as: BENICAR     TAKE these medications   Accu-Chek Aviva Plus test strip Generic drug: glucose blood Use as instructed once Emalynn Clewis day.  DX Code E11.9   Accu-Chek Softclix Lancets lancets Use as instructed once Yasemin Rabon day.  DX Code: E11.9   allopurinol 300 MG tablet Commonly known as:  ZYLOPRIM Take 1 tablet (300 mg total) by mouth 2 (two) times daily. Needs ov/follow up   aspirin 81 MG EC tablet Take 1 tablet (81 mg total) by mouth daily.   CALCIUM 600-D PO Take 1 tablet by mouth 2 (two) times daily before Mickey Esguerra meal.   dexamethasone 6 MG tablet Commonly known as: DECADRON Take 1 tablet (6 mg total) by mouth daily for 5 days. Start taking on: April 30, 2020   Dexilant 60 MG capsule Generic drug: dexlansoprazole TAKE 1 CAPSULE BY MOUTH TWICE Armany Mano DAY   fenofibrate 160 MG tablet Take 1 tablet (160 mg total) by mouth daily.   ferrous sulfate 325 (65 FE) MG tablet Take 1 tablet (325 mg total) by mouth 2 (two) times daily.   Janumet XR [phone] MG Tb24 Generic drug: SitaGLIPtin-MetFORMIN HCl Take 1 tablet by mouth daily.   nitroGLYCERIN 0.4 MG SL tablet Commonly known as: NITROSTAT Place 1 tablet (0.4 mg total) under the tongue every 5 (five) minutes x 3 doses as needed for chest pain.   Pepcid 20 MG tablet Generic drug: famotidine Take 20 mg by mouth daily.   rosuvastatin 40 MG tablet Commonly known as: CRESTOR Take 1 tablet (40 mg total) by mouth daily.   Udderly Smooth Crea Apply 1 application topically See admin instructions. Apply topically to skin folds as needed for rash/irritation   Vitamin D (Ergocalciferol) 1.25 MG (50000 UNIT) Caps capsule Commonly known as: DRISDOL Take one tablet wkly What changed:   how much to take  how to take this  when to take this  additional instructions      Allergies  Allergen Reactions  . Other Other (See Comments)    SOME BANDAIDS CAUSE SKIN IRRITATION  . Penicillins Itching    Has patient had Alison Breeding PCN reaction causing immediate rash, facial/tongue/throat swelling, SOB or lightheadedness with hypotension: No Has patient had Yaquelin Langelier PCN reaction causing severe rash involving mucus membranes or skin necrosis: No Has patient had Indya Oliveria PCN reaction that required hospitalization No Has patient had Chayla Shands PCN reaction  occurring within the last 10 years: No If all of the above answers are "NO", then may proceed with Cephalosporin use.       The results of significant diagnostics from this hospitalization (including imaging, microbiology, ancillary and laboratory) are listed below for reference.    Significant Diagnostic Studies: NM Pulmonary Perfusion  Result Date: 04/28/2020 CLINICAL DATA:  Shortness of breath, positive D-dimer, COVID-19 pneumonia and acute kidney injury. EXAM: NUCLEAR MEDICINE PERFUSION LUNG SCAN TECHNIQUE: Perfusion images were obtained in multiple projections  after intravenous injection of radiopharmaceutical. Ventilation scans intentionally deferred if perfusion scan and chest x-ray adequate for interpretation during COVID 19 epidemic. RADIOPHARMACEUTICALS:  4.4 mCi Tc-2mMAA IV COMPARISON:  Chest x-ray on 04/27/2020 FINDINGS: Perfusion imaging in 8 different projections demonstrates normal perfusion of both lungs with no significant perfusion defects. No evidence to suggest significant pulmonary embolism. IMPRESSION: Normal nuclear medicine pulmonary perfusion imaging demonstrating no perfusion defects and no evidence of pulmonary embolism. Electronically Signed   By: GAletta EdouardM.D.   On: 04/28/2020 15:33   [place]NAL  Result Date: 04/26/2020 CLINICAL DATA:  Acute kidney injury EXAM: RENAL / URINARY TRACT ULTRASOUND COMPLETE COMPARISON:  None. FINDINGS: Right Kidney: Renal measurements: 11.2 x 4.7 x 4.8 cm = volume: 138 mL . Echogenicity within normal limits. No mass or hydronephrosis visualized. Left Kidney: Renal measurements: 11.3 x 6.5 x 5.8 cm = volume: 220 mL. Echogenicity within normal limits. No mass or hydronephrosis visualized. Bladder: Appears normal for degree of bladder distention. Other: Increased echogenicity seen throughout the liver parenchyma. The spleen appears slightly enlarged measuring 14 cm. IMPRESSION: Normal appearing kidneys and bladder Hepatic steatosis Mild  splenomegaly Electronically Signed   By: BPrudencio PairM.D.   On: 04/26/2020 20:25   DG CHEST PORT 1 VIEW  Result Date: 04/29/2020 CLINICAL DATA:  COVID-19 positive with cough. Recent syncopal episode EXAM: PORTABLE CHEST 1 VIEW COMPARISON:  April 27, 2020 FINDINGS: There is patchy airspace opacity in the left mid and lower lung regions. There is also subtle ill-defined opacity in the right mid and lower lung regions. Heart is mildly enlarged with pulmonary vascularity normal. No adenopathy. No bone lesions. IMPRESSION: Multifocal pneumonia, somewhat more on the left than on the right, similar to recent study. Stable cardiac prominence. No adenopathy evident. Electronically Signed   By: WLowella GripIII M.D.   On: 04/29/2020 11:19   DG CHEST PORT 1 VIEW  Result Date: 04/27/2020 CLINICAL DATA:  COVID-19 positivity EXAM: PORTABLE CHEST 1 VIEW COMPARISON:  04/26/2020 FINDINGS: Cardiac shadow is enlarged but stable. Lungs are well aerated bilaterally. Patchy opacities are identified in both lungs left greater than right increased from the prior exam consistent with the given clinical history. No bony abnormality is noted. IMPRESSION: Increasing infiltrates bilaterally as described consistent with the given clinical history. Electronically Signed   By: MInez CatalinaM.D.   On: 04/27/2020 04:53   DG Chest Port 1 View  Result Date: 04/26/2020 CLINICAL DATA:  Cough. Reported COVID-19 positive. Syncopal episode. EXAM: PORTABLE CHEST 1 VIEW COMPARISON:  December 08, 2017 FINDINGS: Atelectatic change noted in the left mid lung. Subtle increased opacity in the right upper and mid lung regions. No consolidation. Heart is borderline enlarged. With pulmonary vascularity normal. No adenopathy. No bone lesions. IMPRESSION: Ill-defined areas of airspace opacity in the right upper and right mid lung regions, likely early multifocal pneumonia. Atelectasis left mid lung. Suspect atypical organism pneumonia, particularly  given the clinical history. Heart borderline enlarged.  No evident adenopathy. Electronically Signed   By: WLowella GripIII M.D.   On: 04/26/2020 14:55   CUP PACEART REMOTE DEVICE CHECK  Result Date: 04/22/2020 [facility] summary report received. Battery status OK. Normal device function. No new symptom episodes, tachy episodes, brady, or pause episodes. No new AF episodes. Monthly summary reports and ROV/PRN JM  VAS [place]LOWER EXTREMITY VENOUS (DVT)  Result Date: 04/29/2020  Lower Venous DVT Study Indications: Elevated Ddimer.  Risk Factors: COVID 19 positive. Limitations: Body habitus and  poor ultrasound/tissue interface. Comparison Study: No prior studies. Performing Technologist: Oliver Hum RVT  Examination Guidelines: Eladio Dentremont complete evaluation includes B-mode imaging, spectral Doppler, color Doppler, and power Doppler as needed of all accessible portions of each vessel. Bilateral testing is considered an integral part of Makella Buckingham complete examination. Limited examinations for reoccurring indications may be performed as noted. The reflux portion of the exam is performed with the patient in reverse Trendelenburg.  +---------+---------------+---------+-----------+----------+--------------+ RIGHT    CompressibilityPhasicitySpontaneityPropertiesThrombus Aging +---------+---------------+---------+-----------+----------+--------------+ CFV      Full           Yes      Yes                                 +---------+---------------+---------+-----------+----------+--------------+ SFJ      Full                                                        +---------+---------------+---------+-----------+----------+--------------+ FV Prox  Full                                                        +---------+---------------+---------+-----------+----------+--------------+ FV Mid   Full                                                         +---------+---------------+---------+-----------+----------+--------------+ FV DistalFull                                                        +---------+---------------+---------+-----------+----------+--------------+ PFV      Full                                                        +---------+---------------+---------+-----------+----------+--------------+ POP      Full           Yes      Yes                                 +---------+---------------+---------+-----------+----------+--------------+ PTV      Full                                                        +---------+---------------+---------+-----------+----------+--------------+ PERO  Not visualized +---------+---------------+---------+-----------+----------+--------------+   +---------+---------------+---------+-----------+----------+--------------+ LEFT     CompressibilityPhasicitySpontaneityPropertiesThrombus Aging +---------+---------------+---------+-----------+----------+--------------+ CFV      Full           Yes      Yes                                 +---------+---------------+---------+-----------+----------+--------------+ SFJ      Full                                                        +---------+---------------+---------+-----------+----------+--------------+ FV Prox  Full                                                        +---------+---------------+---------+-----------+----------+--------------+ FV Mid   Full                                                        +---------+---------------+---------+-----------+----------+--------------+ FV Distal               Yes      Yes                                 +---------+---------------+---------+-----------+----------+--------------+ PFV      Full                                                         +---------+---------------+---------+-----------+----------+--------------+ POP      Full           Yes      Yes                                 +---------+---------------+---------+-----------+----------+--------------+ PTV      Full                                                        +---------+---------------+---------+-----------+----------+--------------+ PERO                                                  Not visualized +---------+---------------+---------+-----------+----------+--------------+     Summary: RIGHT: - There is no evidence of deep vein thrombosis in the lower extremity. However, portions of this examination were limited- see technologist comments above.  - No cystic structure found in the popliteal fossa.  LEFT: - There is no evidence of deep vein thrombosis in  the lower extremity. However, portions of this examination were limited- see technologist comments above.  - No cystic structure found in the popliteal fossa.  *See table(s) above for measurements and observations. Electronically signed by Monica Martinez MD on 04/29/2020 at 3:42:00 PM.    Final     Microbiology: Recent Results (from the past 240 hour(s))  Blood Culture (routine x 2)     Status: None (Preliminary result)   Collection Time: 04/26/20  2:02 PM   Specimen: BLOOD  Result Value Ref Range Status   Specimen Description   Final    BLOOD LEFT ANTECUBITAL Performed at [facility], [place] [address], [place], [place] [address]    Special Requests   Final    BOTTLES DRAWN AEROBIC AND ANAEROBIC Blood Culture results may not be optimal due to an excessive volume of blood received in culture bottles Performed at [place] [address], [place], [place] [id number]    Culture   Final    NO GROWTH 3 DAYS Performed at [facility], [place] [address], [place], [place] [address]    Report Status PENDING  Incomplete  Blood Culture (routine x 2)      Status: None (Preliminary result)   Collection Time: 04/26/20  2:07 PM   Specimen: BLOOD  Result Value Ref Range Status   Specimen Description   Final    BLOOD RIGHT ANTECUBITAL Performed at [place] [address], [place], [place] [address]    Special Requests   Final    BOTTLES DRAWN AEROBIC AND ANAEROBIC Blood Culture adequate volume Performed at [place] [address] [place], [place] [address]    Culture   Final    NO GROWTH 3 DAYS Performed at [facility], [place] [address] [place], [place] [address]    Report Status PENDING  Incomplete  SARS Coronavirus 2 by RT PCR (hospital order, performed in [facility] hospital lab) Nasopharyngeal Nasopharyngeal Swab     Status: Abnormal   Collection Time: 04/26/20  2:51 PM   Specimen: Nasopharyngeal Swab  Result Value Ref Range Status   SARS Coronavirus 2 POSITIVE (Jowan Skillin) NEGATIVE Final    Comment: RESULT CALLED TO, READ BACK BY AND VERIFIED WITH: S.LEONARD AT 2026 ON 04/26/20 BY N.THOMPSON (NOTE) SARS-CoV-2 target nucleic acids are DETECTED  SARS-CoV-2 RNA is generally detectable in upper respiratory specimens  during the acute phase of infection.  Positive results are indicative  of the presence of the identified virus, but do not rule out bacterial infection or co-infection with other pathogens not detected by the test.  Clinical correlation with patient history and  other diagnostic information is necessary to determine patient infection status.  The expected result is negative.  Fact Sheet for Patients:   StrictlyIdeas.no   Fact Sheet for Healthcare Providers:   BankingDealers.co.za    This test is not yet approved or cleared by the [place] FDA and  has been authorized for detection and/or diagnosis of SARS-CoV-2 by FDA under an Emergency Use Authorization (EUA).  This EUA will remain in effect (meaning  this test can be  used) for the duration of  the COVID-19 declaration under Section 564(b)(1) of the Act, 21 U.S.C. section 360-bbb-3(b)(1), unless the authorization is terminated or revoked sooner.  Performed at [facility], [place] [address] [place], [place] [address]   Culture, Urine     Status: Abnormal   Collection Time: 04/28/20  1:00 AM   Specimen: Urine, Clean Catch  Result Value Ref Range Status   Specimen Description   Final    URINE, CLEAN CATCH Performed at [facility], [place] [address], [place], [place] [address]    Special Requests   Final    NONE Performed at [facility], [place] [address] [place], [place] [address]    Culture (Renny Remer)  Final    >=100,000 COLONIES/mL MULTIPLE SPECIES PRESENT, SUGGEST RECOLLECTION   Report Status 04/29/2020 FINAL  Final     Labs: Basic Metabolic Panel: Recent Labs  Lab 04/26/20 1402 04/27/20 0452 04/28/20 0500 04/29/20 0458  NA 137 130* 139 142  K 4.2 4.2 3.9 3.9  CL 102 103 104 105  CO2 21* 15* 24 22  GLUCOSE 109* 153* 117* 86  BUN 56* 49* 44* 38*  CREATININE 2.66* 1.85* 1.29* 1.41*  CALCIUM 9.0 8.6* 8.9 9.1  MG  --  2.2 1.9 1.9  PHOS  --  3.1  --   --    Liver Function Tests: Recent Labs  Lab 04/26/20 1402 04/27/20 0452 04/28/20 0500 04/29/20 0458  AST 42* 44* 41 49*  ALT 19 20 21 24   ALKPHOS 36* 36* 33* 38  BILITOT 0.7 0.7 0.5 0.9  PROT 6.2* 6.2* 5.8* 6.1*  ALBUMIN 3.4* 3.0* 2.8* 3.1*   No results for input(s): LIPASE, AMYLASE in the last 168 hours. No results for input(s): AMMONIA in the last 168 hours. CBC: Recent Labs  Lab 04/26/20 1402 04/27/20 0452 04/28/20 0500 04/29/20 0458  WBC 5.9 4.7 5.6 4.7  NEUTROABS 3.8 3.5 3.6 3.0  HGB 10.2* 10.1* 9.9* 10.9*  HCT 31.8* 31.4* 30.3* 34.1*  MCV 97.5 96.6 95.3 97.7  PLT 130* 120* 146* 169   Cardiac Enzymes: No results for input(s): CKTOTAL, CKMB, CKMBINDEX, TROPONINI in the last 168 hours. BNP: BNP (last 3  results) Recent Labs    04/27/20 0452 04/28/20 0500  BNP 9.2 37.5    ProBNP (last 3 results) No results for input(s): PROBNP in the last 8760 hours.  CBG: Recent Labs  Lab 04/28/20 1135 04/28/20 1720 04/28/20 2116 04/29/20 0817 04/29/20 1140  GLUCAP 96 140* 124* 89 122*       Signed:  Fayrene Helper MD.  [facility] 04/29/2020, 3:49 PM

## 2020-04-29 NOTE — ED Notes (Signed)
O2 stat 93% with ambulation. MD made aware.

## 2020-04-29 NOTE — Progress Notes (Signed)
The patient is scheduled for a Remdesivir infusion on 8/17 at 2 pm. Have the patient come to [facility], they will see a COVID infusion banner by the road.  Enter there and turn left. There are marked spaces for Infusion.  Call the number on the sign or [phone] and someone will come out and bring them inside.  If someone is driving them, have them come to the same area and call the number and someone will come outside to get them. Thank you!

## 2020-04-29 NOTE — ED Notes (Signed)
Patient received lunch tray 

## 2020-04-29 NOTE — ED Notes (Signed)
Patient received breakfast tray 

## 2020-04-29 NOTE — Progress Notes (Signed)
Received msg pt wanted to leave AMA Discharge orders placed around 250 pm, but ?pt held for home health?  [place] for pt to go home.  Will need to follow up with social work in AM.

## 2020-04-30 ENCOUNTER — Ambulatory Visit (HOSPITAL_COMMUNITY)
Admit: 2020-04-30 | Discharge: 2020-04-30 | Disposition: A | Discharge status: Home / Self Care | Payer: BC Managed Care – PPO | Attending: Pulmonary Disease | Admitting: Pulmonary Disease

## 2020-04-30 DIAGNOSIS — U071 COVID-19: Secondary | ICD-10-CM | POA: Diagnosis not present

## 2020-04-30 DIAGNOSIS — J1282 Pneumonia due to coronavirus disease 2019: Secondary | ICD-10-CM | POA: Insufficient documentation

## 2020-04-30 MED ORDER — DIPHENHYDRAMINE HCL 50 MG/ML IJ SOLN: 50.0000 mg | Freq: Once | INTRAMUSCULAR | Status: DC | PRN

## 2020-04-30 MED ORDER — FAMOTIDINE IN NACL 20-0.9 MG/50ML-% IV SOLN: 20.0000 mg | Freq: Once | INTRAVENOUS | Status: DC | PRN

## 2020-04-30 MED ORDER — SODIUM CHLORIDE 0.9 % IV SOLN
100.0000 mg | Freq: Once | INTRAVENOUS | Status: AC
  Administered 2020-04-30: 100 mg via INTRAVENOUS
  Filled 2020-04-30: qty 20

## 2020-04-30 MED ORDER — METHYLPREDNISOLONE SODIUM SUCC 125 MG IJ SOLR: 125.0000 mg | Freq: Once | INTRAMUSCULAR | Status: DC | PRN

## 2020-04-30 MED ORDER — ALBUTEROL SULFATE HFA 108 (90 BASE) MCG/ACT IN AERS: 2.0000 | INHALATION_SPRAY | Freq: Once | RESPIRATORY_TRACT | Status: DC | PRN

## 2020-04-30 MED ORDER — SODIUM CHLORIDE 0.9 % IV SOLN: INTRAVENOUS | Status: DC | PRN

## 2020-04-30 MED ORDER — EPINEPHRINE 0.3 MG/0.3ML IJ SOAJ: 0.3000 mg | Freq: Once | INTRAMUSCULAR | Status: DC | PRN

## 2020-04-30 NOTE — Progress Notes (Signed)
04/30/2020 1258 pm TOC CM contacted pt to follow up on [facility], states she is agreeable to [facility]. Referral faxed to COVID remote health. Updated attending. [place] [place] [facility] TOC CM [phone]

## 2020-04-30 NOTE — Discharge Instructions (Signed)
10 Things You Can Do to Manage Your COVID-19 Symptoms at Home If you have possible or confirmed COVID-19: 1. Stay home from work and school. And stay away from other public places. If you must go out, avoid using any kind of public transportation, ridesharing, or taxis. 2. Monitor your symptoms carefully. If your symptoms get worse, call your healthcare provider immediately. 3. Get rest and stay hydrated. 4. If you have a medical appointment, call the healthcare provider ahead of time and tell them that you have or may have COVID-19. 5. For medical emergencies, call 911 and notify the dispatch personnel that you have or may have COVID-19. 6. Cover your cough and sneezes with a tissue or use the inside of your elbow. 7. Wash your hands often with soap and water for at least 20 seconds or clean your hands with an alcohol-based hand sanitizer that contains at least 60% alcohol. 8. As much as possible, stay in a specific room and away from other people in your home. Also, you should use a separate bathroom, if available. If you need to be around other people in or outside of the home, wear a mask. 9. Avoid sharing personal items with other people in your household, like dishes, towels, and bedding. 10. Clean all surfaces that are touched often, like counters, tabletops, and doorknobs. Use household cleaning sprays or wipes according to the label instructions. [email or web address] 03/15/2019 This information is not intended to replace advice given to you by your health care provider. Make sure you discuss any questions you have with your health care provider. Document Revised: 08/17/2019 Document Reviewed: 08/17/2019 Elsevier Patient Education  [employer]. .m

## 2020-04-30 NOTE — Progress Notes (Signed)
  Diagnosis: COVID-19  Physician:Dr. Asencion Noble  Procedure: [facility] Med: remdesivir infusion - Provided patient with remdesivir fact sheet for patients, parents and caregivers prior to infusion.  Complications: No immediate complications noted.  Discharge: Discharged home   Sanjuana Mae 04/30/2020

## 2020-05-01 LAB — CULTURE, BLOOD (ROUTINE X 2)
Culture: NO GROWTH
Culture: NO GROWTH
Special Requests: ADEQUATE

## 2020-05-15 ENCOUNTER — Telehealth: Payer: Self-pay | Admitting: Physician Assistant

## 2020-05-15 NOTE — Telephone Encounter (Signed)
error 

## 2020-05-15 NOTE — Telephone Encounter (Signed)
Patient's wife called on behalf of patient (DPR on file) and has some questions about her husband's valsartan. Please contact at [phone]

## 2020-05-19 ENCOUNTER — Other Ambulatory Visit: Payer: Self-pay | Admitting: Physician Assistant

## 2020-05-19 DIAGNOSIS — E785 Hyperlipidemia, unspecified: Secondary | ICD-10-CM

## 2020-05-19 DIAGNOSIS — M1000 Idiopathic gout, unspecified site: Secondary | ICD-10-CM

## 2020-05-20 ENCOUNTER — Other Ambulatory Visit: Payer: Self-pay | Admitting: Family Medicine

## 2020-05-20 DIAGNOSIS — IMO0002 Reserved for concepts with insufficient information to code with codable children: Secondary | ICD-10-CM

## 2020-05-21 ENCOUNTER — Telehealth: Payer: Self-pay | Admitting: Physician Assistant

## 2020-05-21 DIAGNOSIS — IMO0002 Reserved for concepts with insufficient information to code with codable children: Secondary | ICD-10-CM

## 2020-05-21 DIAGNOSIS — E559 Vitamin D deficiency, unspecified: Secondary | ICD-10-CM

## 2020-05-21 MED ORDER — JANUMET XR 100-1000 MG PO TB24: 1.0000 | ORAL_TABLET | Freq: Every day | ORAL | 0 refills | Status: DC

## 2020-05-21 MED ORDER — VITAMIN D (ERGOCALCIFEROL) 1.25 MG (50000 UNIT) PO CAPS: ORAL_CAPSULE | ORAL | 0 refills | Status: DC

## 2020-05-21 NOTE — Telephone Encounter (Signed)
Patient last seen 11/27/19 for new patient apt and was advised to follow up in 4-6 months.   Short term supply of med sent to requested pharmacy.   Please call patient to schedule follow up apt for further refills. AS, CMA

## 2020-05-21 NOTE — Telephone Encounter (Signed)
Patient is requesting a refill of her Vit D and Janumet, if approved please send to [facility] on [address].

## 2020-05-21 NOTE — Addendum Note (Signed)
Addended by: Mickel Crow on: 05/21/2020 09:53 AM   Modules accepted: Orders

## 2020-05-22 ENCOUNTER — Other Ambulatory Visit: Payer: Self-pay | Admitting: Family Medicine

## 2020-05-22 DIAGNOSIS — I10 Essential (primary) hypertension: Secondary | ICD-10-CM

## 2020-05-22 LAB — CUP PACEART REMOTE DEVICE CHECK
Date Time Interrogation Session: 20210905233451
Implantable Pulse Generator Implant Date: 20180705

## 2020-05-27 ENCOUNTER — Ambulatory Visit (INDEPENDENT_AMBULATORY_CARE_PROVIDER_SITE_OTHER): Payer: BC Managed Care – PPO | Admitting: *Deleted

## 2020-05-27 DIAGNOSIS — I63411 Cerebral infarction due to embolism of right middle cerebral artery: Secondary | ICD-10-CM

## 2020-05-29 NOTE — Progress Notes (Signed)
Carelink Summary Report / Loop Recorder 

## 2020-06-12 ENCOUNTER — Other Ambulatory Visit: Payer: Self-pay | Admitting: Physician Assistant

## 2020-06-12 DIAGNOSIS — E559 Vitamin D deficiency, unspecified: Secondary | ICD-10-CM

## 2020-06-22 LAB — CUP PACEART REMOTE DEVICE CHECK
Date Time Interrogation Session: 20211008233752
Implantable Pulse Generator Implant Date: 20180705

## 2020-07-01 ENCOUNTER — Ambulatory Visit (INDEPENDENT_AMBULATORY_CARE_PROVIDER_SITE_OTHER): Payer: BC Managed Care – PPO

## 2020-07-01 DIAGNOSIS — I63411 Cerebral infarction due to embolism of right middle cerebral artery: Secondary | ICD-10-CM

## 2020-07-05 NOTE — Progress Notes (Signed)
Carelink Summary Report / Loop Recorder 

## 2020-07-09 ENCOUNTER — Other Ambulatory Visit: Payer: Self-pay | Admitting: Internal Medicine

## 2020-07-22 ENCOUNTER — Other Ambulatory Visit: Payer: Self-pay | Admitting: Physician Assistant

## 2020-07-22 ENCOUNTER — Telehealth: Payer: Self-pay | Admitting: Physician Assistant

## 2020-07-22 ENCOUNTER — Other Ambulatory Visit: Payer: Self-pay | Admitting: Family Medicine

## 2020-07-22 DIAGNOSIS — M1000 Idiopathic gout, unspecified site: Secondary | ICD-10-CM

## 2020-07-22 DIAGNOSIS — I10 Essential (primary) hypertension: Secondary | ICD-10-CM

## 2020-07-22 DIAGNOSIS — E785 Hyperlipidemia, unspecified: Secondary | ICD-10-CM

## 2020-07-22 DIAGNOSIS — E559 Vitamin D deficiency, unspecified: Secondary | ICD-10-CM

## 2020-07-22 DIAGNOSIS — IMO0002 Reserved for concepts with insufficient information to code with codable children: Secondary | ICD-10-CM

## 2020-07-22 DIAGNOSIS — E1151 Type 2 diabetes mellitus with diabetic peripheral angiopathy without gangrene: Secondary | ICD-10-CM

## 2020-07-22 NOTE — Telephone Encounter (Signed)
Sending mychart message to patient to advise apt needed for further med refills. AS, CMA

## 2020-07-22 NOTE — Telephone Encounter (Signed)
Please call patient to schedule apt for further refills. AS, CMA

## 2020-08-02 ENCOUNTER — Other Ambulatory Visit: Payer: Self-pay | Admitting: Internal Medicine

## 2020-08-02 DIAGNOSIS — K317 Polyp of stomach and duodenum: Secondary | ICD-10-CM

## 2020-08-05 ENCOUNTER — Other Ambulatory Visit: Payer: Self-pay | Admitting: Family Medicine

## 2020-08-05 ENCOUNTER — Other Ambulatory Visit: Payer: Self-pay

## 2020-08-05 ENCOUNTER — Telehealth: Payer: Self-pay | Admitting: Physician Assistant

## 2020-08-05 ENCOUNTER — Ambulatory Visit (INDEPENDENT_AMBULATORY_CARE_PROVIDER_SITE_OTHER): Payer: BC Managed Care – PPO

## 2020-08-05 ENCOUNTER — Other Ambulatory Visit: Payer: Self-pay | Admitting: Physician Assistant

## 2020-08-05 DIAGNOSIS — I63411 Cerebral infarction due to embolism of right middle cerebral artery: Secondary | ICD-10-CM

## 2020-08-05 DIAGNOSIS — I10 Essential (primary) hypertension: Secondary | ICD-10-CM

## 2020-08-05 DIAGNOSIS — K317 Polyp of stomach and duodenum: Secondary | ICD-10-CM

## 2020-08-05 DIAGNOSIS — E1151 Type 2 diabetes mellitus with diabetic peripheral angiopathy without gangrene: Secondary | ICD-10-CM

## 2020-08-05 DIAGNOSIS — IMO0002 Reserved for concepts with insufficient information to code with codable children: Secondary | ICD-10-CM

## 2020-08-05 DIAGNOSIS — E785 Hyperlipidemia, unspecified: Secondary | ICD-10-CM

## 2020-08-05 LAB — CUP PACEART REMOTE DEVICE CHECK
Date Time Interrogation Session: 20211121233527
Implantable Pulse Generator Implant Date: 20180705

## 2020-08-05 MED ORDER — OLMESARTAN MEDOXOMIL 5 MG PO TABS: 10.0000 mg | ORAL_TABLET | Freq: Every day | ORAL | 0 refills | Status: DC

## 2020-08-05 NOTE — Telephone Encounter (Signed)
Pt called stating that although her olmesartan was marked to DC at discharge in August, she has continued to take this medication.  She states that she has been taking 54m tablets 2 tablets once daily. She is requesting a refill of this medication.  Her BPs for the last 5 days were 135/82, 106/72, 121/78, 126/81, 135/77.  Please review for appropriateness of refill request.    Pt also needs refills on pended medications to last her until her appt on 08/22/2020.  TCharyl Bigger CMA

## 2020-08-05 NOTE — Telephone Encounter (Signed)
FROM ED VISIT 04/29/20:   Recommendations for Outpatient Follow-up:  1. Follow outpatient CBC/CMP 2. Follow final dose of remdesivir outpatient 8/17 3. Continue quarantine per CDC guidelines (05/17/2020) 4. Holding benicar until pt can follow with outpatient PCP to follow renal function and BP outpatient  5. Follow CXR in 3-4 weeks 6. Follow steatosis, splenomegaly outpatient   Patient failed to follow up for repeat labs to recheck kidney function from ER visit in August.    Per [facility] patient needs labs rechecked at next OV. Ok to send in 30 day supply of Benicar to pharmacy. AS, CMA

## 2020-08-05 NOTE — Telephone Encounter (Signed)
Spoke with Darrick Meigs at [facility] in [place] on [address] and she states patients refills are on hold due to patient just filling her RX for 30 days on 07/23/20. Per Darrick Meigs they will not be able to pick up any medications until 08/22/20. Patient will have enough medication to make it to the appointment if she is taking medication as prescribed. AS, CMA

## 2020-08-07 NOTE — Progress Notes (Signed)
Carelink Summary Report / Loop Recorder 

## 2020-08-22 ENCOUNTER — Encounter: Payer: Self-pay | Admitting: Physician Assistant

## 2020-08-22 ENCOUNTER — Other Ambulatory Visit: Payer: Self-pay

## 2020-08-22 ENCOUNTER — Ambulatory Visit: Payer: BC Managed Care – PPO | Admitting: Physician Assistant

## 2020-08-22 VITALS — BP 108/70 | HR 69 | Temp 98.6°F | Ht 66.0 in | Wt 228.4 lb

## 2020-08-22 DIAGNOSIS — L84 Corns and callosities: Secondary | ICD-10-CM

## 2020-08-22 DIAGNOSIS — E1169 Type 2 diabetes mellitus with other specified complication: Secondary | ICD-10-CM | POA: Diagnosis not present

## 2020-08-22 DIAGNOSIS — I152 Hypertension secondary to endocrine disorders: Secondary | ICD-10-CM

## 2020-08-22 DIAGNOSIS — E559 Vitamin D deficiency, unspecified: Secondary | ICD-10-CM

## 2020-08-22 DIAGNOSIS — E1159 Type 2 diabetes mellitus with other circulatory complications: Secondary | ICD-10-CM | POA: Diagnosis not present

## 2020-08-22 DIAGNOSIS — E119 Type 2 diabetes mellitus without complications: Secondary | ICD-10-CM | POA: Diagnosis not present

## 2020-08-22 DIAGNOSIS — E782 Mixed hyperlipidemia: Secondary | ICD-10-CM

## 2020-08-22 LAB — POCT GLYCOSYLATED HEMOGLOBIN (HGB A1C): Hemoglobin A1C: 5.6 % (ref 4.0–5.6)

## 2020-08-22 NOTE — Patient Instructions (Signed)
DASH Eating Plan DASH stands for "Dietary Approaches to Stop Hypertension." The DASH eating plan is a healthy eating plan that has been shown to reduce high blood pressure (hypertension). It may also reduce your risk for type 2 diabetes, heart disease, and stroke. The DASH eating plan may also help with weight loss. What are tips for following this plan?  General guidelines  Avoid eating more than 2,300 mg (milligrams) of salt (sodium) a day. If you have hypertension, you may need to reduce your sodium intake to 1,500 mg a day.  Limit alcohol intake to no more than 1 drink a day for nonpregnant women and 2 drinks a day for men. One drink equals 12 oz of beer, 5 oz of wine, or 1 oz of hard liquor.  Work with your health care provider to maintain a healthy body weight or to lose weight. Ask what an ideal weight is for you.  Get at least 30 minutes of exercise that causes your heart to beat faster (aerobic exercise) most days of the week. Activities may include walking, swimming, or biking.  Work with your health care provider or diet and nutrition specialist (dietitian) to adjust your eating plan to your individual calorie needs. Reading food labels   Check food labels for the amount of sodium per serving. Choose foods with less than 5 percent of the Daily Value of sodium. Generally, foods with less than 300 mg of sodium per serving fit into this eating plan.  To find whole grains, look for the word "whole" as the first word in the ingredient list. Shopping  Buy products labeled as "low-sodium" or "no salt added."  Buy fresh foods. Avoid canned foods and premade or frozen meals. Cooking  Avoid adding salt when cooking. Use salt-free seasonings or herbs instead of table salt or sea salt. Check with your health care provider or pharmacist before using salt substitutes.  Do not fry foods. Cook foods using healthy methods such as baking, boiling, grilling, and broiling instead.  Cook with  heart-healthy oils, such as olive, canola, soybean, or sunflower oil. Meal planning  Eat a balanced diet that includes: ? 5 or more servings of fruits and vegetables each day. At each meal, try to fill half of your plate with fruits and vegetables. ? Up to 6-8 servings of whole grains each day. ? Less than 6 oz of lean meat, poultry, or fish each day. A 3-oz serving of meat is about the same size as a deck of cards. One egg equals 1 oz. ? 2 servings of low-fat dairy each day. ? A serving of nuts, seeds, or beans 5 times each week. ? Heart-healthy fats. Healthy fats called Omega-3 fatty acids are found in foods such as flaxseeds and coldwater fish, like sardines, salmon, and mackerel.  Limit how much you eat of the following: ? Canned or prepackaged foods. ? Food that is high in trans fat, such as fried foods. ? Food that is high in saturated fat, such as fatty meat. ? Sweets, desserts, sugary drinks, and other foods with added sugar. ? Full-fat dairy products.  Do not salt foods before eating.  Try to eat at least 2 vegetarian meals each week.  Eat more home-cooked food and less restaurant, buffet, and fast food.  When eating at a restaurant, ask that your food be prepared with less salt or no salt, if possible. What foods are recommended? The items listed may not be a complete list. Talk with your dietitian about   what dietary choices are best for you. Grains Whole-grain or whole-wheat bread. Whole-grain or whole-wheat pasta. Brown rice. Oatmeal. Quinoa. Bulgur. Whole-grain and low-sodium cereals. Pita bread. Low-fat, low-sodium crackers. Whole-wheat flour tortillas. Vegetables Fresh or frozen vegetables (raw, steamed, roasted, or grilled). Low-sodium or reduced-sodium tomato and vegetable juice. Low-sodium or reduced-sodium tomato sauce and tomato paste. Low-sodium or reduced-sodium canned vegetables. Fruits All fresh, dried, or frozen fruit. Canned fruit in natural juice (without  added sugar). Meat and other protein foods Skinless chicken or turkey. Ground chicken or turkey. Pork with fat trimmed off. Fish and seafood. Egg whites. Dried beans, peas, or lentils. Unsalted nuts, nut butters, and seeds. Unsalted canned beans. Lean cuts of beef with fat trimmed off. Low-sodium, lean deli meat. Dairy Low-fat (1%) or fat-free (skim) milk. Fat-free, low-fat, or reduced-fat cheeses. Nonfat, low-sodium ricotta or cottage cheese. Low-fat or nonfat yogurt. Low-fat, low-sodium cheese. Fats and oils Soft margarine without trans fats. Vegetable oil. Low-fat, reduced-fat, or light mayonnaise and salad dressings (reduced-sodium). Canola, safflower, olive, soybean, and sunflower oils. Avocado. Seasoning and other foods Herbs. Spices. Seasoning mixes without salt. Unsalted popcorn and pretzels. Fat-free sweets. What foods are not recommended? The items listed may not be a complete list. Talk with your dietitian about what dietary choices are best for you. Grains Baked goods made with fat, such as croissants, muffins, or some breads. Dry pasta or rice meal packs. Vegetables Creamed or fried vegetables. Vegetables in a cheese sauce. Regular canned vegetables (not low-sodium or reduced-sodium). Regular canned tomato sauce and paste (not low-sodium or reduced-sodium). Regular tomato and vegetable juice (not low-sodium or reduced-sodium). Pickles. Olives. Fruits Canned fruit in a light or heavy syrup. Fried fruit. Fruit in cream or butter sauce. Meat and other protein foods Fatty cuts of meat. Ribs. Fried meat. Bacon. Sausage. Bologna and other processed lunch meats. Salami. Fatback. Hotdogs. Bratwurst. Salted nuts and seeds. Canned beans with added salt. Canned or smoked fish. Whole eggs or egg yolks. Chicken or turkey with skin. Dairy Whole or 2% milk, cream, and half-and-half. Whole or full-fat cream cheese. Whole-fat or sweetened yogurt. Full-fat cheese. Nondairy creamers. Whipped toppings.  Processed cheese and cheese spreads. Fats and oils Butter. Stick margarine. Lard. Shortening. Ghee. Bacon fat. Tropical oils, such as coconut, palm kernel, or palm oil. Seasoning and other foods Salted popcorn and pretzels. Onion salt, garlic salt, seasoned salt, table salt, and sea salt. Worcestershire sauce. Tartar sauce. Barbecue sauce. Teriyaki sauce. Soy sauce, including reduced-sodium. Steak sauce. Canned and packaged gravies. Fish sauce. Oyster sauce. Cocktail sauce. Horseradish that you find on the shelf. Ketchup. Mustard. Meat flavorings and tenderizers. Bouillon cubes. Hot sauce and Tabasco sauce. Premade or packaged marinades. Premade or packaged taco seasonings. Relishes. Regular salad dressings. Where to find more information:  National Heart, Lung, and Blood Institute: www.nhlbi.nih.gov  American Heart Association: www.heart.org Summary  The DASH eating plan is a healthy eating plan that has been shown to reduce high blood pressure (hypertension). It may also reduce your risk for type 2 diabetes, heart disease, and stroke.  With the DASH eating plan, you should limit salt (sodium) intake to 2,300 mg a day. If you have hypertension, you may need to reduce your sodium intake to 1,500 mg a day.  When on the DASH eating plan, aim to eat more fresh fruits and vegetables, whole grains, lean proteins, low-fat dairy, and heart-healthy fats.  Work with your health care provider or diet and nutrition specialist (dietitian) to adjust your eating plan to your   individual calorie needs. This information is not intended to replace advice given to you by your health care provider. Make sure you discuss any questions you have with your health care provider. Document Revised: 08/13/2017 Document Reviewed: 08/24/2016 Elsevier Patient Education  2020 [employer]  

## 2020-08-22 NOTE — Progress Notes (Signed)
Established Patient Office Visit  Subjective:  Patient ID: Rachel Black, female    DOB: 26-Nov-1950  Age: 69 y.o. MRN: [id number]  CC:  Chief Complaint  Patient presents with  . Diabetes  . Hyperlipidemia  . Hypertension    HPI Rachel Black presents for follow up on diabetes mellitus, hyperlipidemia and hypertension.  Diabetes mellitus: Pt does report some increased and urination. Pt reports medication compliance. No hypoglycemic events. Checking glucose at home once weekly. Reports FBS range in the low 100s.  HLD: Pt taking medication as directed without issues. States since having Covid has felt like appetite has decreased.   HTN: Pt denies chest pain, palpitations, dizziness or lower extremity swelling. Reports was not aware she was suppose to hold off on Benicar until she called the office for medication refill. States has not been taking medication since then. Check BP at home but unable to recall readings and forgot to bring BP log.    Past Medical History:  Diagnosis Date  . Arthritis    PAIN AND OA BOTH KNEES AND SHOULDERS AND ELBOWS AND WRIST  . Blood transfusion without reported diagnosis 2018   after cholecystectomy  . Diabetes mellitus    ORAL MEDICATION  . GERD (gastroesophageal reflux disease)   . Gout    NO RECENT FLARE UPS  . H/O: rheumatic fever    AS A CHILD - NO KNOWN HEART MURMUR OR HEART PROBLEMS  . Heart attack ([place])   . Hyperlipidemia   . Hypertension   . Stroke [facility]) 03/2017    Past Surgical History:  Procedure Laterality Date  . ABDOMINAL HYSTERECTOMY    . CESAREAN SECTION    . CHOLECYSTECTOMY N/A 12/09/2016   Procedure: LAPAROSCOPIC CHOLECYSTECTOMY WITH INTRAOPERATIVE CHOLANGIOGRAM;  Surgeon: Stark Klein, MD;  Location: [facility];  Service: General;  Laterality: N/A;  . COLONOSCOPY WITH PROPOFOL N/A 01/30/2013   Procedure: COLONOSCOPY WITH PROPOFOL;  Surgeon: Irene Shipper, MD;  Location: [facility];  Service: Endoscopy;  Laterality:  N/A;  . ERCP N/A 12/08/2016   Procedure: ENDOSCOPIC RETROGRADE CHOLANGIOPANCREATOGRAPHY (ERCP);  Surgeon: Ladene Artist, MD;  Location: [facility];  Service: Endoscopy;  Laterality: N/A;  . ESOPHAGOGASTRODUODENOSCOPY (EGD) WITH PROPOFOL N/A 01/30/2013   Procedure: ESOPHAGOGASTRODUODENOSCOPY (EGD) WITH PROPOFOL;  Surgeon: Irene Shipper, MD;  Location: [facility];  Service: Endoscopy;  Laterality: N/A;  . LEFT HEART CATH AND CORONARY ANGIOGRAPHY N/A 10/18/2017   Procedure: LEFT HEART CATH AND CORONARY ANGIOGRAPHY;  Surgeon: Martinique, Peter M, MD;  Location: [place] [facility];  Service: Cardiovascular;  Laterality: N/A;  . LOOP RECORDER INSERTION N/A 03/18/2017   Procedure: Loop Recorder Insertion;  Surgeon: Constance Haw, MD;  Location: [place] [facility];  Service: Cardiovascular;  Laterality: N/A;  . PARTIAL HYSTERECTOMY    . TEE WITHOUT CARDIOVERSION N/A 03/16/2017   Procedure: TRANSESOPHAGEAL ECHOCARDIOGRAM (TEE);  Surgeon: Acie Fredrickson Wonda Cheng, MD;  Location: [facility] ENDOSCOPY;  Service: Cardiovascular;  Laterality: N/A;  . TOTAL KNEE ARTHROPLASTY Right 04/20/2014   Procedure: RIGHT TOTAL KNEE ARTHROPLASTY CONVERTED TO RIGHT KNEE REIMPLANTATION;  Surgeon: Mcarthur Rossetti, MD;  Location: [facility];  Service: Orthopedics;  Laterality: Right;  . TOTAL KNEE ARTHROPLASTY Left 08/23/2015   Procedure: LEFT TOTAL KNEE ARTHROPLASTY;  Surgeon: Mcarthur Rossetti, MD;  Location: [facility];  Service: Orthopedics;  Laterality: Left;  Marland Kitchen VENTRAL HERNIA REPAIR     x2    Family History  Problem Relation Age of Onset  . Diabetes Mother   .  Hypertension Mother        entire family  . Breast cancer Mother        diagnosed in her 74's  . Stroke Father        CVA  . Hyperlipidemia Father        entire family  . Diabetes Father   . Heart disease Father        No details.  Not at an early age.    . Crohn's disease Son   . Irritable bowel syndrome Son     Social History   Socioeconomic History  .  Marital status: Married    Spouse name: Not on file  . Number of children: 2  . Years of education: Not on file  . Highest education level: Not on file  Occupational History  . Occupation: housewife    Employer: UNEMPLOYED  Tobacco Use  . Smoking status: Never Smoker  . Smokeless tobacco: Never Used  Substance and Sexual Activity  . Alcohol use: No  . Drug use: No  . Sexual activity: Yes    Partners: Male  Other Topics Concern  . Not on file  Social History Narrative   No exercise secondary to knee pain   Social Determinants of Health   Financial Resource Strain: Not on file  Food Insecurity: Not on file  Transportation Needs: Not on file  Physical Activity: Not on file  Stress: Not on file  Social Connections: Not on file  Intimate Partner Violence: Not on file    Outpatient Medications Prior to Visit  Medication Sig Dispense Refill  . ACCU-CHEK SOFTCLIX LANCETS lancets Use as instructed once a day.  DX Code: E11.9 100 each 1  . allopurinol (ZYLOPRIM) 300 MG tablet TAKE 1 TABLET (300 MG TOTAL) BY MOUTH 2 (TWO) TIMES DAILY. **PATIENT NEEDS APT FOR FURTHER REFILLS** 60 tablet 0  . aspirin 81 MG EC tablet Take 1 tablet (81 mg total) by mouth daily.    . Calcium Carb-Cholecalciferol (CALCIUM 600-D PO) Take 1 tablet by mouth 2 (two) times daily before a meal.    . DEXILANT 60 MG capsule TAKE 1 CAPSULE BY MOUTH TWICE A DAY 180 capsule 1  . Emollient (UDDERLY SMOOTH) CREA Apply 1 application topically See admin instructions. Apply topically to skin folds as needed for rash/irritation    . famotidine (PEPCID) 20 MG tablet Take 20 mg by mouth daily.    . fenofibrate 160 MG tablet TAKE 1 TABLET (160 MG TOTAL) BY MOUTH DAILY. **PATIENT NEEDS APT FOR FURTHER REFILLS** 15 tablet 0  . ferrous sulfate 325 (65 FE) MG tablet TAKE 1 TABLET BY MOUTH TWICE A DAY 180 tablet 0  . glucose blood (ACCU-CHEK AVIVA PLUS) test strip Use as instructed once a day.  DX Code E11.9 100 each 1  . JANUMET  XR [id number] MG TB24 TAKE 1 TABLET BY MOUTH DAILY. **PATIENT NEEDS APT FOR FURTHER REFILLS** 15 tablet 0  . nitroGLYCERIN (NITROSTAT) 0.4 MG SL tablet Place 1 tablet (0.4 mg total) under the tongue every 5 (five) minutes x 3 doses as needed for chest pain. 25 tablet 2  . olmesartan (BENICAR) 5 MG tablet Take 2 tablets (10 mg total) by mouth daily. **NO FURTHER REFILLS UNTIL PATIENT IS SEEN** 60 tablet 0  . rosuvastatin (CRESTOR) 40 MG tablet Take 1 tablet (40 mg total) by mouth daily. 90 tablet 3  . Vitamin D, Ergocalciferol, (DRISDOL) 1.25 MG (50000 UNIT) CAPS capsule Take one tablet wkly**NEEDS APT FOR ANY  FURTHER REFILLS** 2 capsule 0   No facility-administered medications prior to visit.    Allergies  Allergen Reactions  . Other Other (See Comments)    SOME BANDAIDS CAUSE SKIN IRRITATION  . Penicillins Itching    Has patient had a PCN reaction causing immediate rash, facial/tongue/throat swelling, SOB or lightheadedness with hypotension: No Has patient had a PCN reaction causing severe rash involving mucus membranes or skin necrosis: No Has patient had a PCN reaction that required hospitalization No Has patient had a PCN reaction occurring within the last 10 years: No If all of the above answers are "NO", then may proceed with Cephalosporin use.     ROS Review of Systems A fourteen system review of systems was performed and found to be positive as per HPI.   Objective:    Physical Exam General:  Well Developed, well nourished, appropriate for stated age.  Neuro:  Alert and oriented,  extra-ocular muscles intact  HEENT:  Normocephalic, atraumatic, neck supple, no carotid bruits appreciated  Skin:  no gross rash, warm, pink. Cardiac:  RRR, S1 S2 Respiratory:  ECTA B/L, Not using accessory muscles, speaking in full sentences- unlabored. MSK: Left foot: firm and thickened skin on lateral great toe  Vascular:  Ext warm, no cyanosis apprec.; No gross edema Psych:  No HI/SI,  judgement and insight good, Euthymic mood. Full Affect.   BP 108/70   Pulse 69   Temp 98.6 F (37 C) (Oral)   Ht 5\' 6"  (1.676 m)   Wt 228 lb 6.4 oz (103.6 kg)   SpO2 99% Comment: on RA  BMI 36.86 kg/m  Wt Readings from Last 3 Encounters:  08/22/20 228 lb 6.4 oz (103.6 kg)  04/26/20 233 lb (105.7 kg)  12/06/19 242 lb 12.8 oz (110.1 kg)     Health Maintenance Due  Topic Date Due  . COVID-19 Vaccine (1) Never done  . COLONOSCOPY  01/30/2014  . OPHTHALMOLOGY EXAM  03/27/2015  . PNA vac Low Risk Adult (2 of 2 - PPSV23) 09/21/2018  . MAMMOGRAM  01/05/2019  . INFLUENZA VACCINE  04/14/2020    There are no preventive care reminders to display for this patient.  Lab Results  Component Value Date   TSH 2.540 11/27/2019   Lab Results  Component Value Date   WBC 4.7 04/29/2020   HGB 10.9 (L) 04/29/2020   HCT 34.1 (L) 04/29/2020   MCV 97.7 04/29/2020   PLT 169 04/29/2020   Lab Results  Component Value Date   NA 142 04/29/2020   K 3.9 04/29/2020   CO2 22 04/29/2020   GLUCOSE 86 04/29/2020   BUN 38 (H) 04/29/2020   CREATININE 1.41 (H) 04/29/2020   BILITOT 0.9 04/29/2020   ALKPHOS 38 04/29/2020   AST 49 (H) 04/29/2020   ALT 24 04/29/2020   PROT 6.1 (L) 04/29/2020   ALBUMIN 3.1 (L) 04/29/2020   CALCIUM 9.1 04/29/2020   ANIONGAP 15 04/29/2020   GFR 39.40 (L) 12/21/2017   Lab Results  Component Value Date   CHOL 140 11/27/2019   Lab Results  Component Value Date   HDL 36 (L) 11/27/2019   Lab Results  Component Value Date   LDLCALC 56 11/27/2019   Lab Results  Component Value Date   TRIG 265 (H) 04/26/2020   Lab Results  Component Value Date   CHOLHDL 3.9 11/27/2019   Lab Results  Component Value Date   HGBA1C 5.6 08/22/2020      Assessment & Plan:  Problem List Items Addressed This Visit      Cardiovascular and Mediastinum   Hypertension associated with diabetes ([place])     Endocrine   Mixed diabetic hyperlipidemia associated with type 2  diabetes mellitus ([place])    Other Visit Diagnoses    Diabetes mellitus without complication ([place])    -  Primary   Relevant Orders   POCT glycosylated hemoglobin (Hb A1C) (Completed)   Vitamin D deficiency       Callus of foot       Relevant Orders   Ambulatory referral to Podiatry     Diabetes mellitus without complication: -X8P today 5.6, well controlled. Discussed with patient medication adjustment if A1c continues to be well controlled to reduce risk of hypoglycemia. Will repeat in 3 months. -Continue current medication regimen. -Recommend to monitor carbohydrates and glucose. -Continue ambulatory glucose monitoring. -Will continue to monitor.  Hypertension associated with diabetes: -BP at goal. -Recommend to hold off on Benicar until renal function is checked. Recommend to bring BP log at lab visit, if BP readings have been <130/85 then will consider to discontinue medication. (Patinet was advised to hold off on Benicar due to AKI in 04/2020). -Recommend to stay well hydrated and follow a low sodium diet. -Will continue to monitor. -Plan to repeat renal function with FBW.  Mixed diabetic hyperlipidemia associated with type 2 diabetes mellitus: -Last lipid panel: Total cholesterol 140, triglycerides 306, HDL 36, LDL 56 -Continue Fenofibrate and Crestor. -Recommend to follow a heart healthy diet low in saturated and transfats. -Advised patient to schedule lab visit for fasting blood work.  Vitamin D deficiency: -Last vitamin D 20.7, low -Plan to repeat vitamin D FBW.  Pending results, will send additional refills of vitamin D 50,000 units if indicated.  Callus of foot: -Placed podiatry referral.   No orders of the defined types were placed in this encounter.   Follow-up: Return in about 3 months (around 11/20/2020) for DM, HTN, HLD; lab visit for FBW include Vit D in 1-3 weeks.   Note:  This note was prepared with assistance of Dragon voice recognition software. Occasional  wrong-word or sound-a-like substitutions may have occurred due to the inherent limitations of voice recognition software.   Lorrene Reid, PA-C

## 2020-08-25 ENCOUNTER — Other Ambulatory Visit: Payer: Self-pay | Admitting: Physician Assistant

## 2020-08-25 DIAGNOSIS — E559 Vitamin D deficiency, unspecified: Secondary | ICD-10-CM

## 2020-08-26 ENCOUNTER — Telehealth: Payer: Self-pay | Admitting: Physician Assistant

## 2020-08-26 NOTE — Telephone Encounter (Signed)
Patient needs a refill on allpurinol, janumet, and vitamen D. [facility] [address]. Thanks

## 2020-08-27 ENCOUNTER — Other Ambulatory Visit: Payer: Self-pay | Admitting: Physician Assistant

## 2020-08-27 ENCOUNTER — Other Ambulatory Visit: Payer: Self-pay | Admitting: Family Medicine

## 2020-08-27 ENCOUNTER — Other Ambulatory Visit: Payer: Self-pay | Admitting: Internal Medicine

## 2020-08-27 DIAGNOSIS — M1000 Idiopathic gout, unspecified site: Secondary | ICD-10-CM

## 2020-08-27 DIAGNOSIS — K317 Polyp of stomach and duodenum: Secondary | ICD-10-CM

## 2020-09-03 ENCOUNTER — Other Ambulatory Visit: Payer: Self-pay | Admitting: Physician Assistant

## 2020-09-03 ENCOUNTER — Other Ambulatory Visit: Payer: Self-pay

## 2020-09-03 ENCOUNTER — Encounter: Payer: Self-pay | Admitting: Podiatry

## 2020-09-03 ENCOUNTER — Ambulatory Visit (INDEPENDENT_AMBULATORY_CARE_PROVIDER_SITE_OTHER): Payer: BC Managed Care – PPO | Admitting: Podiatry

## 2020-09-03 DIAGNOSIS — Q666 Other congenital valgus deformities of feet: Secondary | ICD-10-CM

## 2020-09-03 DIAGNOSIS — E559 Vitamin D deficiency, unspecified: Secondary | ICD-10-CM

## 2020-09-04 ENCOUNTER — Encounter: Payer: Self-pay | Admitting: Podiatry

## 2020-09-04 ENCOUNTER — Other Ambulatory Visit: Payer: Self-pay

## 2020-09-04 DIAGNOSIS — E782 Mixed hyperlipidemia: Secondary | ICD-10-CM

## 2020-09-04 DIAGNOSIS — E119 Type 2 diabetes mellitus without complications: Secondary | ICD-10-CM

## 2020-09-04 DIAGNOSIS — R7989 Other specified abnormal findings of blood chemistry: Secondary | ICD-10-CM

## 2020-09-04 DIAGNOSIS — E1159 Type 2 diabetes mellitus with other circulatory complications: Secondary | ICD-10-CM

## 2020-09-04 DIAGNOSIS — Z0000 Encounter for general adult medical examination without abnormal findings: Secondary | ICD-10-CM

## 2020-09-04 DIAGNOSIS — I152 Hypertension secondary to endocrine disorders: Secondary | ICD-10-CM

## 2020-09-04 DIAGNOSIS — E559 Vitamin D deficiency, unspecified: Secondary | ICD-10-CM

## 2020-09-04 NOTE — Progress Notes (Signed)
Subjective:  Patient ID: Rachel Black, female    DOB: 1951/03/31,  MRN: [id number]  Chief Complaint  Patient presents with  . Callouses    Bilateral painful callous    69 y.o. female presents with the above complaint. Patient presents with complaint of semiflexible pes planus deformity as well as bilateral hallux pinch callus. Patient states she has been ill with it for quite some time. She is constantly on her foot and even though she tries to self debride them down to keep coming back. She would like to know what the etiology of this is. She denies any other acute complaint she would like to have them debrided down as well. She has not seen anyone else prior to seeing me for this.   Review of Systems: Negative except as noted in the HPI. Denies N/V/F/Ch.  Past Medical History:  Diagnosis Date  . Arthritis    PAIN AND OA BOTH KNEES AND SHOULDERS AND ELBOWS AND WRIST  . Blood transfusion without reported diagnosis 2018   after cholecystectomy  . Diabetes mellitus    ORAL MEDICATION  . GERD (gastroesophageal reflux disease)   . Gout    NO RECENT FLARE UPS  . H/O: rheumatic fever    AS A CHILD - NO KNOWN HEART MURMUR OR HEART PROBLEMS  . Heart attack ([place])   . Hyperlipidemia   . Hypertension   . Stroke [facility]) 03/2017    Current Outpatient Medications:  .  ACCU-CHEK SOFTCLIX LANCETS lancets, Use as instructed once a day.  DX Code: E11.9, Disp: 100 each, Rfl: 1 .  allopurinol (ZYLOPRIM) 300 MG tablet, TAKE 1 TABLET (300 MG TOTAL) BY MOUTH 2 (TWO) TIMES DAILY. **PATIENT NEEDS APT FOR FURTHER REFILLS**, Disp: 180 tablet, Rfl: 0 .  aspirin 81 MG EC tablet, Take 1 tablet (81 mg total) by mouth daily., Disp: , Rfl:  .  Calcium Carb-Cholecalciferol (CALCIUM 600-D PO), Take 1 tablet by mouth 2 (two) times daily before a meal., Disp: , Rfl:  .  DEXILANT 60 MG capsule, TAKE 1 CAPSULE BY MOUTH TWICE A DAY, Disp: 180 capsule, Rfl: 1 .  Emollient (UDDERLY SMOOTH) CREA, Apply 1 application  topically See admin instructions. Apply topically to skin folds as needed for rash/irritation, Disp: , Rfl:  .  famotidine (PEPCID) 20 MG tablet, Take 20 mg by mouth daily., Disp: , Rfl:  .  fenofibrate 160 MG tablet, TAKE 1 TABLET (160 MG TOTAL) BY MOUTH DAILY. **PATIENT NEEDS APT FOR FURTHER REFILLS**, Disp: 15 tablet, Rfl: 0 .  ferrous sulfate 325 (65 FE) MG tablet, TAKE 1 TABLET BY MOUTH TWICE A DAY, Disp: 180 tablet, Rfl: 0 .  glucose blood (ACCU-CHEK AVIVA PLUS) test strip, Use as instructed once a day.  DX Code E11.9, Disp: 100 each, Rfl: 1 .  JANUMET XR [phone] MG TB24, TAKE 1 TABLET BY MOUTH DAILY. **PATIENT NEEDS APT FOR FURTHER REFILLS**, Disp: 15 tablet, Rfl: 0 .  nitroGLYCERIN (NITROSTAT) 0.4 MG SL tablet, Place 1 tablet (0.4 mg total) under the tongue every 5 (five) minutes x 3 doses as needed for chest pain., Disp: 25 tablet, Rfl: 2 .  olmesartan (BENICAR) 5 MG tablet, Take 2 tablets (10 mg total) by mouth daily. **NO FURTHER REFILLS UNTIL PATIENT IS SEEN**, Disp: 60 tablet, Rfl: 0 .  rosuvastatin (CRESTOR) 40 MG tablet, Take 1 tablet (40 mg total) by mouth daily., Disp: 90 tablet, Rfl: 3 .  Vitamin D, Ergocalciferol, (DRISDOL) 1.25 MG (50000 UNIT) CAPS capsule, TAKE  ONE TABLET WEEKLY, Disp: 12 capsule, Rfl: 0  Social History   Tobacco Use  Smoking Status Never Smoker  Smokeless Tobacco Never Used    Allergies  Allergen Reactions  . Other Other (See Comments)    SOME BANDAIDS CAUSE SKIN IRRITATION  . Penicillins Itching    Has patient had a PCN reaction causing immediate rash, facial/tongue/throat swelling, SOB or lightheadedness with hypotension: No Has patient had a PCN reaction causing severe rash involving mucus membranes or skin necrosis: No Has patient had a PCN reaction that required hospitalization No Has patient had a PCN reaction occurring within the last 10 years: No If all of the above answers are "NO", then may proceed with Cephalosporin use.    Objective:   There were no vitals filed for this visit. There is no height or weight on file to calculate BMI. Constitutional Well developed. Well nourished.  Vascular Dorsalis pedis pulses palpable bilaterally. Posterior tibial pulses palpable bilaterally. Capillary refill normal to all digits.  No cyanosis or clubbing noted. Pedal hair growth normal.  Neurologic Normal speech. Oriented to person, place, and time. Epicritic sensation to light touch grossly present bilaterally.  Dermatologic Nails well groomed and normal in appearance. No open wounds. No skin lesions.  Orthopedic:  Bilateral hallux IPJ hyperkeratotic lesion noted. Gait examination was evaluated patient has semiflexible pes planovalgus deformity with calcaneal eversion too many toe signs partially able to recruit the arch with dorsiflexion of the hallux. Patient is able to perform single and double heel raises.   Radiographs: None Assessment:   1. Pes planovalgus    Plan:  Patient was evaluated and treated and all questions answered.  Semi- flexible pes planovalgus deformity with bilateral hallux IPJ callus -I explained to the patient the etiology of pes planovalgus versus treatment options were discussed in extensive detail. I related to her pushing off with abductor retwist when ambulating on the IPJ of the hallux leading to formation of thick skin/callus. I discussed with her that she will benefit from custom-made orthotics to help control the hindfoot motion and support the arch of the foot and decrease the abductor twist at the IPJ of the hallux. Patient agrees with the plan -Should be scheduled see rec for custom-made orthotics  No follow-ups on file.

## 2020-09-08 LAB — CUP PACEART REMOTE DEVICE CHECK
Date Time Interrogation Session: 20211226000500
Implantable Pulse Generator Implant Date: 20180705

## 2020-09-09 ENCOUNTER — Telehealth: Payer: Self-pay

## 2020-09-09 NOTE — Telephone Encounter (Signed)
ILR reached RRT 09/07/20. Discussed option to leave device in or have device removed. Patient voices she would like to leave device in. Address on file verified. Will send return kit. Marked "I" in [employer]. Apts. Canceled.

## 2020-09-10 ENCOUNTER — Other Ambulatory Visit: Payer: Self-pay

## 2020-09-10 ENCOUNTER — Other Ambulatory Visit: Payer: Self-pay | Admitting: Physician Assistant

## 2020-09-10 ENCOUNTER — Other Ambulatory Visit: Payer: BC Managed Care – PPO

## 2020-09-11 LAB — CBC WITH DIFFERENTIAL/PLATELET
Basophils Absolute: 0 10*3/uL (ref 0.0–0.2)
Basos: 0 %
EOS (ABSOLUTE): 0 10*3/uL (ref 0.0–0.4)
Eos: 0 %
Hematocrit: 38.3 % (ref 34.0–46.6)
Hemoglobin: 13.1 g/dL (ref 11.1–15.9)
Immature Grans (Abs): 0 10*3/uL (ref 0.0–0.1)
Immature Granulocytes: 1 %
Lymphocytes Absolute: 2 10*3/uL (ref 0.7–3.1)
Lymphs: 29 %
MCH: 31.5 pg (ref 26.6–33.0)
MCHC: 34.2 g/dL (ref 31.5–35.7)
MCV: 92 fL (ref 79–97)
Monocytes Absolute: 0.3 10*3/uL (ref 0.1–0.9)
Monocytes: 5 %
Neutrophils Absolute: 4.5 10*3/uL (ref 1.4–7.0)
Neutrophils: 65 %
Platelets: 179 10*3/uL (ref 150–450)
RBC: 4.16 x10E6/uL (ref 3.77–5.28)
RDW: 12.8 % (ref 11.7–15.4)
WBC: 7 10*3/uL (ref 3.4–10.8)

## 2020-09-11 LAB — COMPREHENSIVE METABOLIC PANEL
ALT: 20 IU/L (ref 0–32)
AST: 28 IU/L (ref 0–40)
Albumin/Globulin Ratio: 2.2 (ref 1.2–2.2)
Albumin: 4.3 g/dL (ref 3.8–4.8)
Alkaline Phosphatase: 56 IU/L (ref 44–121)
BUN/Creatinine Ratio: 14 (ref 12–28)
BUN: 19 mg/dL (ref 8–27)
Bilirubin Total: 0.3 mg/dL (ref 0.0–1.2)
CO2: 24 mmol/L (ref 20–29)
Calcium: 10.7 mg/dL — ABNORMAL HIGH (ref 8.7–10.3)
Chloride: 101 mmol/L (ref 96–106)
Creatinine, Ser: 1.33 mg/dL — ABNORMAL HIGH (ref 0.57–1.00)
GFR calc Af Amer: 47 mL/min/{1.73_m2} — ABNORMAL LOW (ref >59)
GFR calc non Af Amer: 41 mL/min/{1.73_m2} — ABNORMAL LOW (ref >59)
Globulin, Total: 2 g/dL (ref 1.5–4.5)
Glucose: 120 mg/dL — ABNORMAL HIGH (ref 65–99)
Potassium: 4.2 mmol/L (ref 3.5–5.2)
Sodium: 140 mmol/L (ref 134–144)
Total Protein: 6.3 g/dL (ref 6.0–8.5)

## 2020-09-11 LAB — HEMOGLOBIN A1C
Est. average glucose Bld gHb Est-mCnc: 120 mg/dL
Hgb A1c MFr Bld: 5.8 % — ABNORMAL HIGH (ref 4.8–5.6)

## 2020-09-11 LAB — LIPID PANEL
Chol/HDL Ratio: 4.4 ratio (ref 0.0–4.4)
Cholesterol, Total: 129 mg/dL (ref 100–199)
HDL: 29 mg/dL — ABNORMAL LOW (ref >39)
LDL Chol Calc (NIH): 54 mg/dL (ref 0–99)
Triglycerides: 291 mg/dL — ABNORMAL HIGH (ref 0–149)
VLDL Cholesterol Cal: 46 mg/dL — ABNORMAL HIGH (ref 5–40)

## 2020-09-11 LAB — TSH: TSH: 3.14 u[IU]/mL (ref 0.450–4.500)

## 2020-09-11 LAB — VITAMIN D 25 HYDROXY (VIT D DEFICIENCY, FRACTURES): Vit D, 25-Hydroxy: 28.2 ng/mL — ABNORMAL LOW (ref 30.0–100.0)

## 2020-09-14 ENCOUNTER — Other Ambulatory Visit: Payer: Self-pay | Admitting: Physician Assistant

## 2020-09-14 DIAGNOSIS — E1151 Type 2 diabetes mellitus with diabetic peripheral angiopathy without gangrene: Secondary | ICD-10-CM

## 2020-09-14 DIAGNOSIS — IMO0002 Reserved for concepts with insufficient information to code with codable children: Secondary | ICD-10-CM

## 2020-09-22 ENCOUNTER — Other Ambulatory Visit: Payer: Self-pay | Admitting: Physician Assistant

## 2020-09-22 DIAGNOSIS — E119 Type 2 diabetes mellitus without complications: Secondary | ICD-10-CM

## 2020-09-22 DIAGNOSIS — E782 Mixed hyperlipidemia: Secondary | ICD-10-CM

## 2020-09-22 DIAGNOSIS — Z0000 Encounter for general adult medical examination without abnormal findings: Secondary | ICD-10-CM

## 2020-09-22 DIAGNOSIS — I152 Hypertension secondary to endocrine disorders: Secondary | ICD-10-CM

## 2020-09-22 DIAGNOSIS — E1159 Type 2 diabetes mellitus with other circulatory complications: Secondary | ICD-10-CM

## 2020-09-24 ENCOUNTER — Ambulatory Visit (INDEPENDENT_AMBULATORY_CARE_PROVIDER_SITE_OTHER): Payer: BC Managed Care – PPO | Admitting: Orthotics

## 2020-09-24 ENCOUNTER — Other Ambulatory Visit: Payer: Self-pay

## 2020-09-24 ENCOUNTER — Other Ambulatory Visit: Payer: BC Managed Care – PPO

## 2020-09-24 DIAGNOSIS — E1159 Type 2 diabetes mellitus with other circulatory complications: Secondary | ICD-10-CM

## 2020-09-24 DIAGNOSIS — Q666 Other congenital valgus deformities of feet: Secondary | ICD-10-CM

## 2020-09-24 DIAGNOSIS — I152 Hypertension secondary to endocrine disorders: Secondary | ICD-10-CM

## 2020-09-24 DIAGNOSIS — R7989 Other specified abnormal findings of blood chemistry: Secondary | ICD-10-CM

## 2020-09-24 DIAGNOSIS — Z0000 Encounter for general adult medical examination without abnormal findings: Secondary | ICD-10-CM

## 2020-09-24 DIAGNOSIS — E559 Vitamin D deficiency, unspecified: Secondary | ICD-10-CM

## 2020-09-24 DIAGNOSIS — E119 Type 2 diabetes mellitus without complications: Secondary | ICD-10-CM

## 2020-09-24 DIAGNOSIS — E782 Mixed hyperlipidemia: Secondary | ICD-10-CM

## 2020-09-24 NOTE — Progress Notes (Signed)
Cast today for f/o to address pesplanvalgus and secondary IPJ callus hallux b/l.  Plan on device which hugs arch and offloads IPJ b/l

## 2020-09-25 ENCOUNTER — Other Ambulatory Visit: Payer: BC Managed Care – PPO

## 2020-09-25 LAB — CBC WITH DIFFERENTIAL/PLATELET
Basophils Absolute: 0 10*3/uL (ref 0.0–0.2)
Basos: 0 %
EOS (ABSOLUTE): 0 10*3/uL (ref 0.0–0.4)
Eos: 0 %
Hematocrit: 38.5 % (ref 34.0–46.6)
Hemoglobin: 12.8 g/dL (ref 11.1–15.9)
Immature Grans (Abs): 0 10*3/uL (ref 0.0–0.1)
Immature Granulocytes: 1 %
Lymphocytes Absolute: 1.9 10*3/uL (ref 0.7–3.1)
Lymphs: 28 %
MCH: 30.6 pg (ref 26.6–33.0)
MCHC: 33.2 g/dL (ref 31.5–35.7)
MCV: 92 fL (ref 79–97)
Monocytes Absolute: 0.3 10*3/uL (ref 0.1–0.9)
Monocytes: 5 %
Neutrophils Absolute: 4.4 10*3/uL (ref 1.4–7.0)
Neutrophils: 66 %
Platelets: 198 10*3/uL (ref 150–450)
RBC: 4.18 x10E6/uL (ref 3.77–5.28)
RDW: 12.6 % (ref 11.7–15.4)
WBC: 6.7 10*3/uL (ref 3.4–10.8)

## 2020-09-25 LAB — LIPID PANEL
Chol/HDL Ratio: 4.1 ratio (ref 0.0–4.4)
Cholesterol, Total: 131 mg/dL (ref 100–199)
HDL: 32 mg/dL — ABNORMAL LOW (ref >39)
LDL Chol Calc (NIH): 60 mg/dL (ref 0–99)
Triglycerides: 240 mg/dL — ABNORMAL HIGH (ref 0–149)
VLDL Cholesterol Cal: 39 mg/dL (ref 5–40)

## 2020-09-25 LAB — HEMOGLOBIN A1C
Est. average glucose Bld gHb Est-mCnc: 126 mg/dL
Hgb A1c MFr Bld: 6 % — ABNORMAL HIGH (ref 4.8–5.6)

## 2020-09-25 LAB — COMPREHENSIVE METABOLIC PANEL
ALT: 18 IU/L (ref 0–32)
AST: 26 IU/L (ref 0–40)
Albumin/Globulin Ratio: 2.2 (ref 1.2–2.2)
Albumin: 4.2 g/dL (ref 3.8–4.8)
Alkaline Phosphatase: 57 IU/L (ref 44–121)
BUN/Creatinine Ratio: 13 (ref 12–28)
BUN: 17 mg/dL (ref 8–27)
Bilirubin Total: 0.4 mg/dL (ref 0.0–1.2)
CO2: 24 mmol/L (ref 20–29)
Calcium: 10.3 mg/dL (ref 8.7–10.3)
Chloride: 101 mmol/L (ref 96–106)
Creatinine, Ser: 1.27 mg/dL — ABNORMAL HIGH (ref 0.57–1.00)
GFR calc Af Amer: 50 mL/min/{1.73_m2} — ABNORMAL LOW (ref >59)
GFR calc non Af Amer: 43 mL/min/{1.73_m2} — ABNORMAL LOW (ref >59)
Globulin, Total: 1.9 g/dL (ref 1.5–4.5)
Glucose: 131 mg/dL — ABNORMAL HIGH (ref 65–99)
Potassium: 4.4 mmol/L (ref 3.5–5.2)
Sodium: 139 mmol/L (ref 134–144)
Total Protein: 6.1 g/dL (ref 6.0–8.5)

## 2020-09-25 LAB — TSH: TSH: 2.84 u[IU]/mL (ref 0.450–4.500)

## 2020-09-25 LAB — PTH, INTACT AND CALCIUM: PTH: 21 pg/mL (ref 15–65)

## 2020-09-25 LAB — VITAMIN D 25 HYDROXY (VIT D DEFICIENCY, FRACTURES): Vit D, 25-Hydroxy: 42 ng/mL (ref 30.0–100.0)

## 2020-09-25 LAB — MAGNESIUM: Magnesium: 2 mg/dL (ref 1.6–2.3)

## 2020-10-01 ENCOUNTER — Telehealth: Payer: Self-pay | Admitting: Physician Assistant

## 2020-10-01 DIAGNOSIS — E785 Hyperlipidemia, unspecified: Secondary | ICD-10-CM

## 2020-10-01 MED ORDER — FENOFIBRATE 160 MG PO TABS: 160.0000 mg | ORAL_TABLET | Freq: Every day | ORAL | 0 refills | Status: DC

## 2020-10-01 NOTE — Telephone Encounter (Signed)
rx sent to pharmacy. AS, CMA

## 2020-10-22 ENCOUNTER — Ambulatory Visit: Payer: BC Managed Care – PPO | Admitting: Orthotics

## 2020-10-22 ENCOUNTER — Other Ambulatory Visit: Payer: Self-pay

## 2020-10-22 ENCOUNTER — Telehealth: Payer: Self-pay | Admitting: Physician Assistant

## 2020-10-22 DIAGNOSIS — E1151 Type 2 diabetes mellitus with diabetic peripheral angiopathy without gangrene: Secondary | ICD-10-CM

## 2020-10-22 DIAGNOSIS — Q666 Other congenital valgus deformities of feet: Secondary | ICD-10-CM

## 2020-10-22 DIAGNOSIS — IMO0002 Reserved for concepts with insufficient information to code with codable children: Secondary | ICD-10-CM

## 2020-10-22 MED ORDER — ACCU-CHEK AVIVA PLUS VI STRP: ORAL_STRIP | 1 refills | Status: DC

## 2020-10-22 NOTE — Telephone Encounter (Signed)
Refill sent to pharmacy. AS, CMA 

## 2020-10-22 NOTE — Progress Notes (Signed)
Patient came in today to pick up custom made foot orthotics.  The goals were accomplished and the patient reported no dissatisfaction with said orthotics.  Patient was advised of breakin period and how to report any issues. 

## 2020-10-22 NOTE — Addendum Note (Signed)
Addended by: Mickel Crow on: 10/22/2020 10:20 AM   Modules accepted: Orders

## 2020-10-22 NOTE — Telephone Encounter (Signed)
Patient called in requesting a refill on glucose blood test strips (accu-check Aviva plus) [facility] [address].

## 2020-10-23 ENCOUNTER — Telehealth: Payer: Self-pay | Admitting: *Deleted

## 2020-10-23 NOTE — Telephone Encounter (Signed)
Patient's husband is calling to fine out the name and address of the shoe store located on [address]  That he recommended . Please call.

## 2020-11-01 ENCOUNTER — Other Ambulatory Visit: Payer: Self-pay | Admitting: Internal Medicine

## 2020-11-01 DIAGNOSIS — K317 Polyp of stomach and duodenum: Secondary | ICD-10-CM

## 2020-11-05 ENCOUNTER — Other Ambulatory Visit: Payer: Self-pay | Admitting: Internal Medicine

## 2020-11-05 DIAGNOSIS — K317 Polyp of stomach and duodenum: Secondary | ICD-10-CM

## 2020-11-06 ENCOUNTER — Other Ambulatory Visit: Payer: Self-pay | Admitting: Internal Medicine

## 2020-11-06 DIAGNOSIS — K317 Polyp of stomach and duodenum: Secondary | ICD-10-CM

## 2020-11-08 ENCOUNTER — Other Ambulatory Visit: Payer: Self-pay | Admitting: Internal Medicine

## 2020-11-08 ENCOUNTER — Telehealth: Payer: Self-pay | Admitting: Internal Medicine

## 2020-11-08 DIAGNOSIS — K317 Polyp of stomach and duodenum: Secondary | ICD-10-CM

## 2020-11-08 NOTE — Telephone Encounter (Signed)
Inbound call from patient requesting additional refills for Ferrous Sulfate [facility] [address] [facility].

## 2020-11-08 NOTE — Telephone Encounter (Signed)
Patient has not been seen in two years.  Needs to schedule an office visit at which point I can refill her iron

## 2020-11-11 ENCOUNTER — Other Ambulatory Visit: Payer: Self-pay | Admitting: Internal Medicine

## 2020-11-11 ENCOUNTER — Other Ambulatory Visit: Payer: Self-pay | Admitting: Physician Assistant

## 2020-11-11 DIAGNOSIS — E559 Vitamin D deficiency, unspecified: Secondary | ICD-10-CM

## 2020-11-11 DIAGNOSIS — K317 Polyp of stomach and duodenum: Secondary | ICD-10-CM

## 2020-11-11 DIAGNOSIS — E785 Hyperlipidemia, unspecified: Secondary | ICD-10-CM

## 2020-11-14 ENCOUNTER — Other Ambulatory Visit: Payer: Self-pay | Admitting: Physician Assistant

## 2020-11-14 DIAGNOSIS — IMO0002 Reserved for concepts with insufficient information to code with codable children: Secondary | ICD-10-CM

## 2020-11-14 DIAGNOSIS — M1000 Idiopathic gout, unspecified site: Secondary | ICD-10-CM

## 2020-11-14 DIAGNOSIS — E1165 Type 2 diabetes mellitus with hyperglycemia: Secondary | ICD-10-CM

## 2020-11-20 ENCOUNTER — Ambulatory Visit (INDEPENDENT_AMBULATORY_CARE_PROVIDER_SITE_OTHER): Payer: BC Managed Care – PPO | Admitting: Physician Assistant

## 2020-11-20 ENCOUNTER — Encounter: Payer: Self-pay | Admitting: Physician Assistant

## 2020-11-20 ENCOUNTER — Other Ambulatory Visit: Payer: Self-pay

## 2020-11-20 VITALS — BP 108/68 | HR 72 | Temp 96.8°F | Ht 66.0 in | Wt 230.2 lb

## 2020-11-20 DIAGNOSIS — M1000 Idiopathic gout, unspecified site: Secondary | ICD-10-CM | POA: Diagnosis not present

## 2020-11-20 DIAGNOSIS — E1151 Type 2 diabetes mellitus with diabetic peripheral angiopathy without gangrene: Secondary | ICD-10-CM

## 2020-11-20 DIAGNOSIS — K7581 Nonalcoholic steatohepatitis (NASH): Secondary | ICD-10-CM | POA: Diagnosis not present

## 2020-11-20 DIAGNOSIS — E1169 Type 2 diabetes mellitus with other specified complication: Secondary | ICD-10-CM | POA: Diagnosis not present

## 2020-11-20 DIAGNOSIS — E1159 Type 2 diabetes mellitus with other circulatory complications: Secondary | ICD-10-CM | POA: Diagnosis not present

## 2020-11-20 DIAGNOSIS — K7460 Unspecified cirrhosis of liver: Secondary | ICD-10-CM

## 2020-11-20 DIAGNOSIS — I25119 Atherosclerotic heart disease of native coronary artery with unspecified angina pectoris: Secondary | ICD-10-CM

## 2020-11-20 DIAGNOSIS — I152 Hypertension secondary to endocrine disorders: Secondary | ICD-10-CM

## 2020-11-20 DIAGNOSIS — E782 Mixed hyperlipidemia: Secondary | ICD-10-CM

## 2020-11-20 MED ORDER — ALLOPURINOL 300 MG PO TABS: 300.0000 mg | ORAL_TABLET | Freq: Two times a day (BID) | ORAL | 0 refills | Status: DC

## 2020-11-20 MED ORDER — OLMESARTAN MEDOXOMIL 5 MG PO TABS: 5.0000 mg | ORAL_TABLET | Freq: Every day | ORAL | 0 refills | Status: DC

## 2020-11-20 MED ORDER — JANUMET XR 100-1000 MG PO TB24: 1.0000 | ORAL_TABLET | Freq: Every day | ORAL | 0 refills | Status: DC

## 2020-11-20 NOTE — Progress Notes (Addendum)
Established Patient Office Visit  Subjective:  Patient ID: Rachel Black, female    DOB: [birth year]  Age: 70 y.o. MRN: [id number]  CC:  Chief Complaint  Patient presents with  . Hypertension  . Hyperlipidemia  . Diabetes    HPI Rachel Black presents for follow up on diabetes mellitus, hypertension and hyperlipidemia. Reports continues to feel tired, mental fog and not eating as much since Covid-19 infection/pneumonia last summer.   Diabetes mellitus: Pt denies increased urination or thirst from baseline. Pt reports medication compliance. No hypoglycemic events. Checking glucose at home. Brings sugar log, FBS <130. States is not good with monitoring sweet intake.   HTN: Pt denies chest pain, palpitations, dizziness or lower extremity swelling. Taking medication as directed without side effects. Checks BP at home and brings BP log, average 120-130/60-80 with recent systolic readings of 811X pulse average 70s.. Pt is trying to stay hydrated.  HLD: Pt taking medication as directed without issues.   Gout: Reports has been quite some time since last flare-up. Does not know any specific triggers.   Past Medical History:  Diagnosis Date  . Arthritis    PAIN AND OA BOTH KNEES AND SHOULDERS AND ELBOWS AND WRIST  . Blood transfusion without reported diagnosis 2018   after cholecystectomy  . Diabetes mellitus    ORAL MEDICATION  . GERD (gastroesophageal reflux disease)   . Gout    NO RECENT FLARE UPS  . H/O: rheumatic fever    AS A CHILD - NO KNOWN HEART MURMUR OR HEART PROBLEMS  . Heart attack ([place])   . Hyperlipidemia   . Hypertension   . Stroke [facility]) 03/2017    Past Surgical History:  Procedure Laterality Date  . ABDOMINAL HYSTERECTOMY    . CESAREAN SECTION    . CHOLECYSTECTOMY N/A 12/09/2016   Procedure: LAPAROSCOPIC CHOLECYSTECTOMY WITH INTRAOPERATIVE CHOLANGIOGRAM;  Surgeon: Stark Klein, MD;  Location: [facility];  Service: General;  Laterality: N/A;  . COLONOSCOPY WITH  PROPOFOL N/A 01/30/2013   Procedure: COLONOSCOPY WITH PROPOFOL;  Surgeon: Irene Shipper, MD;  Location: [facility];  Service: Endoscopy;  Laterality: N/A;  . ERCP N/A 12/08/2016   Procedure: ENDOSCOPIC RETROGRADE CHOLANGIOPANCREATOGRAPHY (ERCP);  Surgeon: Ladene Artist, MD;  Location: [facility];  Service: Endoscopy;  Laterality: N/A;  . ESOPHAGOGASTRODUODENOSCOPY (EGD) WITH PROPOFOL N/A 01/30/2013   Procedure: ESOPHAGOGASTRODUODENOSCOPY (EGD) WITH PROPOFOL;  Surgeon: Irene Shipper, MD;  Location: [facility];  Service: Endoscopy;  Laterality: N/A;  . LEFT HEART CATH AND CORONARY ANGIOGRAPHY N/A 10/18/2017   Procedure: LEFT HEART CATH AND CORONARY ANGIOGRAPHY;  Surgeon: Martinique, Peter M, MD;  Location: [place] [facility];  Service: Cardiovascular;  Laterality: N/A;  . LOOP RECORDER INSERTION N/A 03/18/2017   Procedure: Loop Recorder Insertion;  Surgeon: Constance Haw, MD;  Location: [place] [facility];  Service: Cardiovascular;  Laterality: N/A;  . PARTIAL HYSTERECTOMY    . TEE WITHOUT CARDIOVERSION N/A 03/16/2017   Procedure: TRANSESOPHAGEAL ECHOCARDIOGRAM (TEE);  Surgeon: Acie Fredrickson Wonda Cheng, MD;  Location: [facility] ENDOSCOPY;  Service: Cardiovascular;  Laterality: N/A;  . TOTAL KNEE ARTHROPLASTY Right 04/20/2014   Procedure: RIGHT TOTAL KNEE ARTHROPLASTY CONVERTED TO RIGHT KNEE REIMPLANTATION;  Surgeon: Mcarthur Rossetti, MD;  Location: [facility];  Service: Orthopedics;  Laterality: Right;  . TOTAL KNEE ARTHROPLASTY Left 08/23/2015   Procedure: LEFT TOTAL KNEE ARTHROPLASTY;  Surgeon: Mcarthur Rossetti, MD;  Location: [facility];  Service: Orthopedics;  Laterality: Left;  Marland Kitchen VENTRAL HERNIA REPAIR  x2    Family History  Problem Relation Age of Onset  . Diabetes Mother   . Hypertension Mother        entire family  . Breast cancer Mother        diagnosed in her 36's  . Stroke Father        CVA  . Hyperlipidemia Father        entire family  . Diabetes Father   . Heart disease Father         No details.  Not at an early age.    . Crohn's disease Son   . Irritable bowel syndrome Son     Social History   Socioeconomic History  . Marital status: Married    Spouse name: Not on file  . Number of children: 2  . Years of education: Not on file  . Highest education level: Not on file  Occupational History  . Occupation: housewife    Employer: UNEMPLOYED  Tobacco Use  . Smoking status: Never Smoker  . Smokeless tobacco: Never Used  Substance and Sexual Activity  . Alcohol use: No  . Drug use: No  . Sexual activity: Yes    Partners: Male  Other Topics Concern  . Not on file  Social History Narrative   No exercise secondary to knee pain   Social Determinants of Health   Financial Resource Strain: Not on file  Food Insecurity: Not on file  Transportation Needs: Not on file  Physical Activity: Not on file  Stress: Not on file  Social Connections: Not on file  Intimate Partner Violence: Not on file    Outpatient Medications Prior to Visit  Medication Sig Dispense Refill  . ACCU-CHEK SOFTCLIX LANCETS lancets Use as instructed once a day.  DX Code: E11.9 100 each 1  . aspirin 81 MG EC tablet Take 1 tablet (81 mg total) by mouth daily.    . Calcium Carb-Cholecalciferol (CALCIUM 600-D PO) Take 1 tablet by mouth 2 (two) times daily before a meal.    . DEXILANT 60 MG capsule TAKE 1 CAPSULE BY MOUTH TWICE A DAY 180 capsule 1  . Emollient (UDDERLY SMOOTH) CREA Apply 1 application topically See admin instructions. Apply topically to skin folds as needed for rash/irritation    . famotidine (PEPCID) 20 MG tablet Take 20 mg by mouth daily.    . fenofibrate 160 MG tablet TAKE 1 TABLET BY MOUTH EVERY DAY 90 tablet 0  . ferrous sulfate 325 (65 FE) MG tablet TAKE 1 TABLET BY MOUTH TWICE A DAY 180 tablet 0  . glucose blood (ACCU-CHEK AVIVA PLUS) test strip Use as instructed once a day.  DX Code E11.9 100 each 1  . nitroGLYCERIN (NITROSTAT) 0.4 MG SL tablet Place 1 tablet (0.4 mg  total) under the tongue every 5 (five) minutes x 3 doses as needed for chest pain. 25 tablet 2  . rosuvastatin (CRESTOR) 40 MG tablet Take 1 tablet (40 mg total) by mouth daily. 90 tablet 3  . Vitamin D, Ergocalciferol, (DRISDOL) 1.25 MG (50000 UNIT) CAPS capsule TAKE ONE TABLET WEEKLY 12 capsule 0  . allopurinol (ZYLOPRIM) 300 MG tablet TAKE 1 TABLET (300 MG TOTAL) BY MOUTH 2 (TWO) TIMES DAILY. **PATIENT NEEDS APT FOR FURTHER REFILLS** 180 tablet 0  . JANUMET XR [phone] MG TB24 TAKE 1 TABLET BY MOUTH EVERY DAY 90 tablet 0  . olmesartan (BENICAR) 5 MG tablet Take 2 tablets (10 mg total) by mouth daily. **NO FURTHER REFILLS UNTIL  PATIENT IS SEEN** 60 tablet 0   No facility-administered medications prior to visit.    Allergies  Allergen Reactions  . Other Other (See Comments)    SOME BANDAIDS CAUSE SKIN IRRITATION  . Penicillins Itching    Has patient had a PCN reaction causing immediate rash, facial/tongue/throat swelling, SOB or lightheadedness with hypotension: No Has patient had a PCN reaction causing severe rash involving mucus membranes or skin necrosis: No Has patient had a PCN reaction that required hospitalization No Has patient had a PCN reaction occurring within the last 10 years: No If all of the above answers are "NO", then may proceed with Cephalosporin use.     ROS Review of Systems A fourteen system review of systems was performed and found to be positive as per HPI.    Objective:    Physical Exam General:  Well Developed, well nourished, in no acute distress  Neuro:  Alert and oriented,  extra-ocular muscles intact  HEENT:  Normocephalic, atraumatic, neck supple Skin:  no gross rash, warm, pink. Cardiac:  RRR, S1 S2 wnl's  Respiratory:  ECTA B/L w/o wheezing, Not using accessory muscles, speaking in full sentences- unlabored. Extremities: Good ROM, onychomycosis noted, flat feet, good pedal pulses  Vascular:  Ext warm, no cyanosis apprec.; cap RF less 2 sec.  +edema (nonpitting) Psych:  No HI/SI, judgement and insight good, Euthymic mood. Full Affect.  BP 108/68   Pulse 72   Temp (!) 96.8 F (36 C)   Ht 5\' 6"  (1.676 m)   Wt 230 lb 3.2 oz (104.4 kg)   SpO2 96%   BMI 37.16 kg/m  Wt Readings from Last 3 Encounters:  11/20/20 230 lb 3.2 oz (104.4 kg)  08/22/20 228 lb 6.4 oz (103.6 kg)  04/26/20 233 lb (105.7 kg)     Health Maintenance Due  Topic Date Due  . COVID-19 Vaccine (1) Never done  . COLONOSCOPY (Pts 45-66yrs Insurance coverage will need to be confirmed)  01/30/2014  . OPHTHALMOLOGY EXAM  03/27/2015  . PNA vac Low Risk Adult (2 of 2 - PPSV23) 09/21/2018  . MAMMOGRAM  01/05/2019  . INFLUENZA VACCINE  04/14/2020    There are no preventive care reminders to display for this patient.  Lab Results  Component Value Date   TSH 2.840 09/24/2020   Lab Results  Component Value Date   WBC 6.7 09/24/2020   HGB 12.8 09/24/2020   HCT 38.5 09/24/2020   MCV 92 09/24/2020   PLT 198 09/24/2020   Lab Results  Component Value Date   NA 139 09/24/2020   K 4.4 09/24/2020   CO2 24 09/24/2020   GLUCOSE 131 (H) 09/24/2020   BUN 17 09/24/2020   CREATININE 1.27 (H) 09/24/2020   BILITOT 0.4 09/24/2020   ALKPHOS 57 09/24/2020   AST 26 09/24/2020   ALT 18 09/24/2020   PROT 6.1 09/24/2020   ALBUMIN 4.2 09/24/2020   CALCIUM 10.3 09/24/2020   ANIONGAP 15 04/29/2020   GFR 39.40 (L) 12/21/2017   Lab Results  Component Value Date   CHOL 131 09/24/2020   Lab Results  Component Value Date   HDL 32 (L) 09/24/2020   Lab Results  Component Value Date   LDLCALC 60 09/24/2020   Lab Results  Component Value Date   TRIG 240 (H) 09/24/2020   Lab Results  Component Value Date   CHOLHDL 4.1 09/24/2020   Lab Results  Component Value Date   HGBA1C 6.0 (H) 09/24/2020  Assessment & Plan:   Problem List Items Addressed This Visit      Cardiovascular and Mediastinum   Hypertension associated with diabetes ([place])   Relevant  Medications   olmesartan (BENICAR) 5 MG tablet   SitaGLIPtin-MetFORMIN HCl (JANUMET XR) [phone] MG TB24   Coronary artery disease involving native coronary artery of native heart with angina pectoris (HCC)   Relevant Medications   olmesartan (BENICAR) 5 MG tablet     Endocrine   Diabetes mellitus ([place]) - Primary (Chronic)   Relevant Medications   olmesartan (BENICAR) 5 MG tablet   SitaGLIPtin-MetFORMIN HCl (JANUMET XR) 202 789 3440 MG TB24   Mixed diabetic hyperlipidemia associated with type 2 diabetes mellitus (HCC)   Relevant Medications   olmesartan (BENICAR) 5 MG tablet   SitaGLIPtin-MetFORMIN HCl (JANUMET XR) 202 789 3440 MG TB24     Other   GOUT   Relevant Medications   allopurinol (ZYLOPRIM) 300 MG tablet    Other Visit Diagnoses    Liver cirrhosis secondary to NASH ([place])         Type 2 diabetes mellitus with other specified complication, without long-term current use of insulin: -Last A1c 6.0, mildly increased from 5.8 so we will continue with current medication regimen. -Recommend to continue ambulatory glucose monitoring and monitor for hypoglycemia. -Monitor carbohydrates and glucose intake. -UTD on foot exam, will be due in 08/2021. -Will continue to monitor.  Hypertension associated with diabetes: -BP in office controlled, ambulatory BP readings higher.  Last CMP patient's renal function had returned to baseline so will restart Benicar 5 mg once daily. (Of note, patient originally was on Benicar 10 mg). -Recommend to continue ambulatory BP and pulse monitoring, BP goal <130/80.  Notify the clinic if BP consistently above goal or <100/60 for medication adjustments. -Recommend to stay well-hydrated and monitor sodium intake. -Will repeat CMP at next office visit.  Mixed diabetic hyperlipidemia associated with type 2 diabetes mellitus: -Last lipid panel: Total cholesterol 129, triglycerides 291, HDL 29, LDL 54 (at goal) -Continue current medication regimen.  Last hepatic  function WNL's. -Follow a heart healthy diet and reduce simple carbohydrates. -Will continue to monitor alongside cardiology.  Liver cirrhosis secondary to Karlene Lineman: -Recommend to follow-up with gastroenterology as scheduled. -Last CMP: AST 26, ALT 18 -Patient is inquiring if should continue iron supplement (needs a refill) and recommend to discuss with gastroenterology at follow-up appointment.  Last CBC normal: Hemoglobin 12.8, hematocrit 38.5, RBC 4.18  Coronary artery disease involving native coronary artery of native heart with angina pectoris: -Followed by cardiology. -On aspirin, nitroglycerin and high-dose Crestor.  Gout: -Stable. -Continue current medication regimen.  Provided refill. -Will continue to monitor and plan to repeat renal function at next OV.  Meds ordered this encounter  Medications  . olmesartan (BENICAR) 5 MG tablet    Sig: Take 1 tablet (5 mg total) by mouth daily.    Dispense:  90 tablet    Refill:  0  . allopurinol (ZYLOPRIM) 300 MG tablet    Sig: Take 1 tablet (300 mg total) by mouth 2 (two) times daily.    Dispense:  180 tablet    Refill:  0  . SitaGLIPtin-MetFORMIN HCl (JANUMET XR) 202 789 3440 MG TB24    Sig: Take 1 tablet by mouth daily.    Dispense:  90 tablet    Refill:  0    Follow-up: Return in about 4 months (around 03/22/2021) for DM, HTN, Vit D.    Lorrene Reid, PA-C

## 2020-11-20 NOTE — Patient Instructions (Addendum)
Try OTC Preperation H for rectal soreness  High Triglycerides Eating Plan Triglycerides are a type of fat in the blood. High levels of triglycerides can increase your risk of heart disease and stroke. If your triglyceride levels are high, choosing the right foods can help lower your triglycerides and keep your heart healthy. Work with your health care provider or a diet and nutrition specialist (dietitian) to develop an eating plan that is right for you. What are tips for following this plan? General guidelines  Lose weight, if you are overweight. For most people, losing 5-10 lbs (2-5 kg) helps lower triglyceride levels. A weight-loss plan may include. ? 30 minutes of exercise at least 5 days a week. ? Reducing the amount of calories, sugar, and fat you eat.  Eat a wide variety of fresh fruits, vegetables, and whole grains. These foods are high in fiber.  Eat foods that contain healthy fats, such as fatty fish, nuts, seeds, and olive oil.  Avoid foods that are high in added sugar, added salt (sodium), saturated fat, and trans fat.  Avoid low-fiber, refined carbohydrates such as white bread, crackers, noodles, and white rice.  Avoid foods with partially hydrogenated oils (trans fats), such as fried foods or stick margarine.  Limit alcohol intake to no more than 1 drink a day for nonpregnant women and 2 drinks a day for men. One drink equals 12 oz of beer, 5 oz of wine, or 1 oz of hard liquor. Your health care provider may recommend that you drink less depending on your overall health.   Reading food labels  Check food labels for the amount of saturated fat. Choose foods with no or very little saturated fat.  Check food labels for the amount of trans fat. Choose foods with no trans fat.  Check food labels for the amount of cholesterol. Choose foods low in cholesterol. Ask your dietitian how much cholesterol you should have each day.  Check food labels for the amount of sodium. Choose  foods with less than 140 milligrams (mg) per serving. Shopping  Buy dairy products labeled as nonfat (skim) or low-fat (1%).  Avoid buying processed or prepackaged foods. These are often high in added sugar, sodium, and fat. Cooking  Choose healthy fats when cooking, such as olive oil or canola oil.  Cook foods using lower fat methods, such as baking, broiling, boiling, or grilling.  Make your own sauces, dressings, and marinades when possible, instead of buying them. Store-bought sauces, dressings, and marinades are often high in sodium and sugar. Meal planning  Eat more home-cooked food and less restaurant, buffet, and fast food.  Eat fatty fish at least 2 times each week. Examples of fatty fish include salmon, trout, mackerel, tuna, and herring.  If you eat whole eggs, do not eat more than 3 egg yolks per week. What foods are recommended? The items listed may not be a complete list. Talk with your dietitian about what dietary choices are best for you. Grains Whole wheat or whole grain breads, crackers, cereals, and pasta. Unsweetened oatmeal. Bulgur. Barley. Quinoa. Brown rice. Whole wheat flour tortillas. Vegetables Fresh or frozen vegetables. Low-sodium canned vegetables. Fruits All fresh, canned (in natural juice), or frozen fruits. Meats and other protein foods Skinless chicken or [place]. Ground chicken or [place]. Lean cuts of pork, trimmed of fat. Fish and seafood, especially salmon, trout, and herring. Egg whites. Dried beans, peas, or lentils. Unsalted nuts or seeds. Unsalted canned beans. Natural peanut or almond butter. Dairy Low-fat  dairy products. Skim or low-fat (1%) milk. Reduced fat (2%) and low-sodium cheese. Low-fat ricotta cheese. Low-fat cottage cheese. Plain, low-fat yogurt. Fats and oils Tub margarine without trans fats. Light or reduced-fat mayonnaise. Light or reduced-fat salad dressings. Avocado. Safflower, olive, sunflower, soybean, and canola oils. What  foods are not recommended? The items listed may not be a complete list. Talk with your dietitian about what dietary choices are best for you. Grains White bread. White (regular) pasta. White rice. Cornbread. Bagels. Pastries. Crackers that contain trans fat. Vegetables Creamed or fried vegetables. Vegetables in a cheese sauce. Fruits Sweetened dried fruit. Canned fruit in syrup. Fruit juice. Meats and other protein foods Fatty cuts of meat. Ribs. Chicken wings. Berniece Salines. Sausage. Bologna. Salami. Chitterlings. Fatback. Hot dogs. Bratwurst. Packaged lunch meats. Dairy Whole or reduced-fat (2%) milk. Half-and-half. Cream cheese. Full-fat or sweetened yogurt. Full-fat cheese. Nondairy creamers. Whipped toppings. Processed cheese or cheese spreads. Cheese curds. Beverages Alcohol. Sweetened drinks, such as soda, lemonade, fruit drinks, or punches. Fats and oils Butter. Stick margarine. Lard. Shortening. Ghee. Bacon fat. Tropical oils, such as coconut, palm kernel, or palm oils. Sweets and desserts Corn syrup. Sugars. Honey. Molasses. Candy. Jam and jelly. Syrup. Sweetened cereals. Cookies. Pies. Cakes. Donuts. Muffins. Ice cream. Condiments Store-bought sauces, dressings, and marinades that are high in sugar, such as ketchup and barbecue sauce. Summary  High levels of triglycerides can increase the risk of heart disease and stroke. Choosing the right foods can help lower your triglycerides.  Eat plenty of fresh fruits, vegetables, and whole grains. Choose low-fat dairy and lean meats. Eat fatty fish at least twice a week.  Avoid processed and prepackaged foods with added sugar, sodium, saturated fat, and trans fat.  If you need suggestions or have questions about what types of food are good for you, talk with your health care provider or a dietitian. This information is not intended to replace advice given to you by your health care provider. Make sure you discuss any questions you have with  your health care provider. Document Revised: 01/03/2020 Document Reviewed: 01/03/2020 Elsevier Patient Education  2021 [employer].

## 2020-11-21 LAB — HM DIABETES EYE EXAM

## 2020-11-26 ENCOUNTER — Encounter: Payer: Self-pay | Admitting: Gastroenterology

## 2020-11-26 ENCOUNTER — Ambulatory Visit: Payer: BC Managed Care – PPO | Admitting: Gastroenterology

## 2020-11-26 ENCOUNTER — Other Ambulatory Visit: Payer: Self-pay

## 2020-11-26 ENCOUNTER — Other Ambulatory Visit (INDEPENDENT_AMBULATORY_CARE_PROVIDER_SITE_OTHER): Payer: BC Managed Care – PPO

## 2020-11-26 VITALS — BP 120/68 | HR 76 | Ht 66.0 in | Wt 225.0 lb

## 2020-11-26 DIAGNOSIS — K219 Gastro-esophageal reflux disease without esophagitis: Secondary | ICD-10-CM

## 2020-11-26 DIAGNOSIS — R1084 Generalized abdominal pain: Secondary | ICD-10-CM | POA: Insufficient documentation

## 2020-11-26 DIAGNOSIS — E611 Iron deficiency: Secondary | ICD-10-CM | POA: Insufficient documentation

## 2020-11-26 LAB — IBC PANEL
Iron: 92 ug/dL (ref 42–145)
Saturation Ratios: 18.5 % — ABNORMAL LOW (ref 20.0–50.0)
Transferrin: 356 mg/dL (ref 212.0–360.0)

## 2020-11-26 LAB — FERRITIN: Ferritin: 120.2 ng/mL (ref 10.0–291.0)

## 2020-11-26 MED ORDER — DEXLANSOPRAZOLE 60 MG PO CPDR: 1.0000 | DELAYED_RELEASE_CAPSULE | Freq: Two times a day (BID) | ORAL | 4 refills | Status: DC

## 2020-11-26 MED ORDER — DICYCLOMINE HCL 10 MG PO CAPS: 10.0000 mg | ORAL_CAPSULE | Freq: Two times a day (BID) | ORAL | 2 refills | Status: DC

## 2020-11-26 NOTE — Progress Notes (Signed)
11/26/2020 Rachel Black [id number] 06-28-1951   HISTORY OF PRESENT ILLNESS: This is a 70 year old female who is a patient of Dr. Blanch Media.  She has a past medical history of hypertension, morbid obesity in wheelchair, DM, history of CVA, probable Karlene Lineman cirrhosis, and coronary artery disease/MI amongst other issues.  She follows with Dr. Henrene Pastor for history of GERD, adenomatous colon polyps, iron deficiency anemia secondary to bleeding gastric fundic gland polyps.  She had a small tubular adenoma removed at the time of her last colonoscopy in 2014.  A 5-year surveillance colonoscopy was recommended, but at the time of her May 2019 visit it was decided that she was no longer surveillance candidate due to her multiple comorbidities.  She is here today primarily for refills of her medications.  She takes Dexilant 60 mg twice daily and is in need of refills for that since her last visit here was in January 2020.  She is also asking for refills on her ferrous sulfate 325 mg twice daily that is prescribed by Dr. Henrene Pastor as well.  Her recent hemoglobin was 12.8 g.  There has been no recent iron studies, but a ferritin level several months ago was quite high.  While she is here she also mentions frequent generalized abdominal pain/cramping accompanied by frequent stools.  She says that this is usually more so of an issue in the mornings and tends to calm down throughout the day.  She also complains of some perianal soreness and irritation from multiple bowel movements and wiping.  Was seen here in January 2020 for complaints of rectal bleed and was prescribed hydrocortisone for her hemorrhoids.  Of note, her weight is down about 27 pounds since she was seen and seen here in January 2020.  She said that she had Covid in August 2021 and since then her appetite has not been the same and she tends to feel full much faster.   Past Medical History:  Diagnosis Date  . Arthritis    PAIN AND OA BOTH KNEES AND  SHOULDERS AND ELBOWS AND WRIST  . Blood transfusion without reported diagnosis 2018   after cholecystectomy  . Diabetes mellitus    ORAL MEDICATION  . GERD (gastroesophageal reflux disease)   . Gout    NO RECENT FLARE UPS  . H/O: rheumatic fever    AS A CHILD - NO KNOWN HEART MURMUR OR HEART PROBLEMS  . Heart attack ([place])   . Hyperlipidemia   . Hypertension   . Stroke [facility]) 03/2017   Past Surgical History:  Procedure Laterality Date  . ABDOMINAL HYSTERECTOMY    . CESAREAN SECTION    . CHOLECYSTECTOMY N/A 12/09/2016   Procedure: LAPAROSCOPIC CHOLECYSTECTOMY WITH INTRAOPERATIVE CHOLANGIOGRAM;  Surgeon: Stark Klein, MD;  Location: [facility] ORS;  Service: General;  Laterality: N/A;  . COLONOSCOPY WITH PROPOFOL N/A 01/30/2013   Procedure: COLONOSCOPY WITH PROPOFOL;  Surgeon: Irene Shipper, MD;  Location: [facility] ENDOSCOPY;  Service: Endoscopy;  Laterality: N/A;  . ERCP N/A 12/08/2016   Procedure: ENDOSCOPIC RETROGRADE CHOLANGIOPANCREATOGRAPHY (ERCP);  Surgeon: Ladene Artist, MD;  Location: [facility];  Service: Endoscopy;  Laterality: N/A;  . ESOPHAGOGASTRODUODENOSCOPY (EGD) WITH PROPOFOL N/A 01/30/2013   Procedure: ESOPHAGOGASTRODUODENOSCOPY (EGD) WITH PROPOFOL;  Surgeon: Irene Shipper, MD;  Location: [facility] ENDOSCOPY;  Service: Endoscopy;  Laterality: N/A;  . LEFT HEART CATH AND CORONARY ANGIOGRAPHY N/A 10/18/2017   Procedure: LEFT HEART CATH AND CORONARY ANGIOGRAPHY;  Surgeon: Martinique, Peter M, MD;  Location: [facility] INVASIVE CV  LAB;  Service: Cardiovascular;  Laterality: N/A;  . LOOP RECORDER INSERTION N/A 03/18/2017   Procedure: Loop Recorder Insertion;  Surgeon: Constance Haw, MD;  Location: [facility];  Service: Cardiovascular;  Laterality: N/A;  . PARTIAL HYSTERECTOMY    . TEE WITHOUT CARDIOVERSION N/A 03/16/2017   Procedure: TRANSESOPHAGEAL ECHOCARDIOGRAM (TEE);  Surgeon: Acie Fredrickson Wonda Cheng, MD;  Location: [facility] ENDOSCOPY;  Service: Cardiovascular;  Laterality: N/A;  . TOTAL KNEE ARTHROPLASTY  Right 04/20/2014   Procedure: RIGHT TOTAL KNEE ARTHROPLASTY CONVERTED TO RIGHT KNEE REIMPLANTATION;  Surgeon: Mcarthur Rossetti, MD;  Location: [facility] ORS;  Service: Orthopedics;  Laterality: Right;  . TOTAL KNEE ARTHROPLASTY Left 08/23/2015   Procedure: LEFT TOTAL KNEE ARTHROPLASTY;  Surgeon: Mcarthur Rossetti, MD;  Location: [facility] ORS;  Service: Orthopedics;  Laterality: Left;  Marland Kitchen VENTRAL HERNIA REPAIR     x2    reports that she has never smoked. She has never used smokeless tobacco. She reports that she does not drink alcohol and does not use drugs. family history includes Breast cancer in her mother; Crohn's disease in her son; Diabetes in her father and mother; Heart disease in her father; Hyperlipidemia in her father; Hypertension in her mother; Irritable bowel syndrome in her son; Stroke in her father. Allergies  Allergen Reactions  . Other Other (See Comments)    SOME BANDAIDS CAUSE SKIN IRRITATION  . Penicillins Itching    Has patient had a PCN reaction causing immediate rash, facial/tongue/throat swelling, SOB or lightheadedness with hypotension: No Has patient had a PCN reaction causing severe rash involving mucus membranes or skin necrosis: No Has patient had a PCN reaction that required hospitalization No Has patient had a PCN reaction occurring within the last 10 years: No If all of the above answers are "NO", then may proceed with Cephalosporin use.       Outpatient Encounter Medications as of 11/26/2020  Medication Sig  . ACCU-CHEK SOFTCLIX LANCETS lancets Use as instructed once a day.  DX Code: E11.9  . allopurinol (ZYLOPRIM) 300 MG tablet Take 1 tablet (300 mg total) by mouth 2 (two) times daily.  Marland Kitchen aspirin 81 MG EC tablet Take 1 tablet (81 mg total) by mouth daily.  . Calcium Carb-Cholecalciferol (CALCIUM 600-D PO) Take 1 tablet by mouth 2 (two) times daily before a meal.  . DEXILANT 60 MG capsule TAKE 1 CAPSULE BY MOUTH TWICE A DAY  . Emollient (UDDERLY SMOOTH) CREA  Apply 1 application topically See admin instructions. Apply topically to skin folds as needed for rash/irritation  . famotidine (PEPCID) 20 MG tablet Take 20 mg by mouth daily.  . fenofibrate 160 MG tablet TAKE 1 TABLET BY MOUTH EVERY DAY  . ferrous sulfate 325 (65 FE) MG tablet TAKE 1 TABLET BY MOUTH TWICE A DAY  . glucose blood (ACCU-CHEK AVIVA PLUS) test strip Use as instructed once a day.  DX Code E11.9  . nitroGLYCERIN (NITROSTAT) 0.4 MG SL tablet Place 1 tablet (0.4 mg total) under the tongue every 5 (five) minutes x 3 doses as needed for chest pain.  Marland Kitchen olmesartan (BENICAR) 5 MG tablet Take 1 tablet (5 mg total) by mouth daily.  . rosuvastatin (CRESTOR) 40 MG tablet Take 1 tablet (40 mg total) by mouth daily.  . SitaGLIPtin-MetFORMIN HCl (JANUMET XR) [phone] MG TB24 Take 1 tablet by mouth daily.  . Vitamin D, Ergocalciferol, (DRISDOL) 1.25 MG (50000 UNIT) CAPS capsule TAKE ONE TABLET WEEKLY   No facility-administered encounter medications on file as of  11/26/2020.     REVIEW OF SYSTEMS  : All other systems reviewed and negative except where noted in the History of Present Illness.   PHYSICAL EXAM: BP 120/68   Pulse 76   Ht 5' 6"  (1.676 m)   Wt 225 lb (102.1 kg)   BMI 36.32 kg/m  General: Well developed white female in no acute distress; in wheelchair Head: Normocephalic and atraumatic Eyes:  Sclerae anicteric, conjunctiva pink. Ears: Normal auditory acuity Lungs: Clear throughout to auscultation; no W/R/R. Heart: Regular rate and rhythm; no M/R/G. Abdomen: Soft, non-distended.  BS present.  Minimal diffuse TTP. Musculoskeletal: Symmetrical with no gross deformities  Skin: No lesions on visible extremities Extremities: No edema  Neurological: Alert oriented x 4, grossly non-focal Psychological:  Alert and cooperative. Normal mood and affect  ASSESSMENT AND PLAN: *GERD: We will continue Dexilant 60 mg twice daily.  New prescription sent to pharmacy. *History of iron  deficiency anemia: Recent hemoglobin 12.8 g.  No recent iron studies checked.  She is asking for her ferrous sulfate to be refilled.  Will await iron studies first to determine if she should continue that. *Generalized abdominal pain/cramping with frequent stools, usually in the morning: May be a component of IBS.  We discussed possible CT scan of the abdomen and pelvis with contrast.  She would like to try some dicyclomine first to see if that helps.  Prescription for 10 mg twice daily sent to her pharmacy.  She will call us back in about 4 weeks with an update on her symptoms. *Perianal soreness from frequent stools and wiping: She has hydrocortisone at home from previous.  She is certainly welcome to try that and I recommended trial of Desitin/zinc oxide as well.   CC:  Lorrene Reid, PA-C

## 2020-11-26 NOTE — Patient Instructions (Signed)
If you are age 69 or older, your body mass index should be between 23-30. Your Body mass index is 36.32 kg/m. If this is out of the aforementioned range listed, please consider follow up with your Primary Care Provider.  If you are age 33 or younger, your body mass index should be between 19-25. Your Body mass index is 36.32 kg/m. If this is out of the aformentioned range listed, please consider follow up with your Primary Care Provider.   Your provider has requested that you go to the basement level for lab work before leaving today. Press "B" on the elevator. The lab is located at the first door on the left as you exit the elevator.  Due to recent changes in healthcare laws, you may see the results of your imaging and laboratory studies on MyChart before your provider has had a chance to review them.  We understand that in some cases there may be results that are confusing or concerning to you. Not all laboratory results come back in the same time frame and the provider may be waiting for multiple results in order to interpret others.  Please give [place] 48 hours in order for your provider to thoroughly review all the results before contacting the office for clarification of your results.    We have sent the following medications to your pharmacy for you to pick up at your convenience: Dexilant and dicyclomine   You may use hydrocortisone cream and Desitin on bottom.  Call back in four weeks with an update ask for [employer].  Thank you for choosing me and [facility].  Alonza Bogus, PA-C

## 2020-11-27 NOTE — Progress Notes (Signed)
Assessment and plan noted

## 2020-11-28 ENCOUNTER — Telehealth: Payer: Self-pay | Admitting: Gastroenterology

## 2020-11-28 ENCOUNTER — Other Ambulatory Visit: Payer: Self-pay

## 2020-11-28 DIAGNOSIS — K317 Polyp of stomach and duodenum: Secondary | ICD-10-CM

## 2020-11-28 MED ORDER — FERROUS SULFATE 325 (65 FE) MG PO TABS: 325.0000 mg | ORAL_TABLET | Freq: Two times a day (BID) | ORAL | 0 refills | Status: DC

## 2020-11-28 NOTE — Telephone Encounter (Signed)
See alternate results note.

## 2020-11-28 NOTE — Telephone Encounter (Signed)
Patient returned your call about results, please call patient one more time. Per pt, it is ok to leave a detailed msg if she does not answer the phone.

## 2020-12-03 ENCOUNTER — Other Ambulatory Visit: Payer: Self-pay

## 2020-12-03 ENCOUNTER — Ambulatory Visit (INDEPENDENT_AMBULATORY_CARE_PROVIDER_SITE_OTHER): Payer: BC Managed Care – PPO | Admitting: Podiatry

## 2020-12-03 DIAGNOSIS — Q828 Other specified congenital malformations of skin: Secondary | ICD-10-CM

## 2020-12-03 DIAGNOSIS — B351 Tinea unguium: Secondary | ICD-10-CM | POA: Diagnosis not present

## 2020-12-03 DIAGNOSIS — M79674 Pain in right toe(s): Secondary | ICD-10-CM | POA: Diagnosis not present

## 2020-12-03 DIAGNOSIS — M79675 Pain in left toe(s): Secondary | ICD-10-CM | POA: Diagnosis not present

## 2020-12-03 DIAGNOSIS — E1169 Type 2 diabetes mellitus with other specified complication: Secondary | ICD-10-CM | POA: Diagnosis not present

## 2020-12-03 DIAGNOSIS — Q666 Other congenital valgus deformities of feet: Secondary | ICD-10-CM

## 2020-12-08 ENCOUNTER — Encounter: Payer: Self-pay | Admitting: Podiatry

## 2020-12-08 NOTE — Progress Notes (Signed)
Subjective:  Patient ID: Rachel Black, female    DOB: 09-16-1950,  MRN: [id number]  70 y.o. female presents with preventative diabetic foot care and painful porokeratotic lesion(s) b/l feet and painful mycotic toenails that limit ambulation. Painful toenails interfere with ambulation. Aggravating factors include wearing enclosed shoe gear. Pain is relieved with periodic professional debridement. Painful porokeratotic lesions are aggravated when weightbearing with and without shoegear. Pain is relieved with periodic professional debridement.   Her husband is present during today's visit.   Patient's blood sugar was 120 mg/dl this morning.  PCP: Lorrene Reid, PA-C and last visit was: 11/20/2020.  Patient is requesting diabetic shoes on today's visit. She has orthotics, but state they are not providing relief for calluses.  Review of Systems: Negative except as noted in the HPI.   Allergies  Allergen Reactions  . Other Other (See Comments)    SOME BANDAIDS CAUSE SKIN IRRITATION  . Penicillins Itching    Has patient had a PCN reaction causing immediate rash, facial/tongue/throat swelling, SOB or lightheadedness with hypotension: No Has patient had a PCN reaction causing severe rash involving mucus membranes or skin necrosis: No Has patient had a PCN reaction that required hospitalization No Has patient had a PCN reaction occurring within the last 10 years: No If all of the above answers are "NO", then may proceed with Cephalosporin use.     Objective:  There were no vitals filed for this visit. Constitutional Patient is a pleasant 70 y.o. Caucasian female obese in NAD. AAO x 3.  Vascular Capillary refill time to digits immediate b/l. Palpable pedal pulses b/l LE. Pedal hair sparse. Lower extremity skin temperature gradient within normal limits. No pain with calf compression b/l. No cyanosis or clubbing noted.  Neurologic Normal speech. Protective sensation intact 5/5 intact  bilaterally with 10g monofilament b/l. Vibratory sensation intact b/l.  Dermatologic Pedal skin with normal turgor, texture and tone bilaterally. No open wounds bilaterally. No interdigital macerations bilaterally. Toenails 1-5 b/l elongated, discolored, dystrophic, thickened, crumbly with subungual debris and tenderness to dorsal palpation. Porokeratotic lesion(s) submet head 1 left foot and submet head 1 right foot. No erythema, no edema, no drainage, no fluctuance.  Orthopedic: Normal muscle strength 5/5 to all lower extremity muscle groups bilaterally. No pain crepitus or joint limitation noted with ROM b/l. Pes planus deformity noted b/l.    Hemoglobin A1C Latest Ref Rng & Units 09/24/2020 09/10/2020 08/22/2020 04/27/2020  HGBA1C 4.8 - 5.6 % 6.0(H) 5.8(H) 5.6 6.1(H)  Some recent data might be hidden    Assessment:   1. Pain due to onychomycosis of toenails of both feet   2. Porokeratosis   3. Pes planovalgus   4. Type 2 diabetes mellitus with other specified complication, without long-term current use of insulin ([place])    Plan:  Patient was evaluated and treated and all questions answered.  Onychomycosis with pain -Nails palliatively debridement as below. -Educated on self-care  Procedure: Nail Debridement Rationale: Pain Type of Debridement: manual, sharp debridement. Instrumentation: Nail nipper, rotary burr. Number of Nails: 10  -Examined patient. -Continue diabetic foot care principles. -Patient to continue soft, supportive shoe gear daily. Recommended Skechers shoes with stretchable uppers and memory foam insoles. Will check her benefits for diabetic shoes. -Toenails 1-5 b/l were debrided in length and girth with sterile nail nippers and dremel without iatrogenic bleeding.  -Painful porokeratotic lesion(s) submet head 1 left foot and submet head 1 right foot pared and enucleated with sterile scalpel blade without incident. -Patient to  report any pedal injuries to medical  professional immediately. -Patient/POA to call should there be question/concern in the interim.  Return in about 3 months (around 03/05/2021).  Marzetta Board, DPM

## 2020-12-19 ENCOUNTER — Other Ambulatory Visit: Payer: Self-pay | Admitting: *Deleted

## 2020-12-19 MED ORDER — ISOSORBIDE MONONITRATE ER 30 MG PO TB24: 30.0000 mg | ORAL_TABLET | Freq: Every day | ORAL | 0 refills | Status: DC

## 2020-12-23 ENCOUNTER — Ambulatory Visit (INDEPENDENT_AMBULATORY_CARE_PROVIDER_SITE_OTHER): Payer: BC Managed Care – PPO | Admitting: Podiatry

## 2020-12-23 ENCOUNTER — Other Ambulatory Visit: Payer: Self-pay

## 2020-12-23 DIAGNOSIS — E1169 Type 2 diabetes mellitus with other specified complication: Secondary | ICD-10-CM

## 2020-12-23 DIAGNOSIS — Q666 Other congenital valgus deformities of feet: Secondary | ICD-10-CM

## 2020-12-23 DIAGNOSIS — Q828 Other specified congenital malformations of skin: Secondary | ICD-10-CM

## 2020-12-23 NOTE — Progress Notes (Signed)
Patient presented for foam casting for 3 pair custom diabetic shoe inserts. Patient is measured with a [employer] Device to be a size 10 wide.  Diabetic shoes are chosen from the [employer] Catalog. The shoes chosen are 57 Black  The patient will be contacted when the shoes and inserts are ready to be picked up

## 2021-01-13 DIAGNOSIS — I639 Cerebral infarction, unspecified: Secondary | ICD-10-CM | POA: Insufficient documentation

## 2021-01-13 NOTE — Progress Notes (Signed)
Cardiology Office Note   Date:  01/14/2021   ID:  Rachel Black, Rachel Black 1951-05-10, MRN [id number]  PCP:  Lorrene Reid, PA-C  Cardiologist:   Minus Breeding, MD   Chief Complaint  Patient presents with  . Coronary Artery Disease      History of Present Illness: Rachel Black is a 70 y.o. female who presents for follow-up of coronary artery disease.  She was admitted in February 2019.  She had a non-STEMI.  She had an occluded second marginal.  She was managed medically.  She has had a cryptogenic stroke.  She has an implanted loop recorder.  She did have a PFO on TEE.     Since I last saw her she has had no new complaints.  She has had no chest pain.  She denies any palpitations, presyncope or syncope.  She had a little residual left hand tingling and lip tingling from her stroke.  She gets around slowly because of joint problems.  However, she ambulates through her house. The patient denies any new symptoms such as chest discomfort, neck or arm discomfort. There has been no new shortness of breath, PND or orthopnea. There have been no reported palpitations, presyncope or syncope.  Of note she had COVID last year.  She is lost about 50 pounds she thinks because following that she did not eat as much.  I did review these notes from the hospital and there were no cardiac issues noted during her treatment for COVID-pneumonia.    Her implant now is no longer transmitting and I did review this report and there was no suggestion of atrial fibrillation and she has had her monitor back.  She is going to leave the implant again however.  Past Medical History:  Diagnosis Date  . Arthritis    PAIN AND OA BOTH KNEES AND SHOULDERS AND ELBOWS AND WRIST  . Blood transfusion without reported diagnosis 2018   after cholecystectomy  . Diabetes mellitus    ORAL MEDICATION  . GERD (gastroesophageal reflux disease)   . Gout    NO RECENT FLARE UPS  . H/O: rheumatic fever    AS A CHILD - NO KNOWN  HEART MURMUR OR HEART PROBLEMS  . Heart attack ([place])   . Hyperlipidemia   . Hypertension   . Stroke [facility]) 03/2017    Past Surgical History:  Procedure Laterality Date  . ABDOMINAL HYSTERECTOMY    . CESAREAN SECTION    . CHOLECYSTECTOMY N/A 12/09/2016   Procedure: LAPAROSCOPIC CHOLECYSTECTOMY WITH INTRAOPERATIVE CHOLANGIOGRAM;  Surgeon: Stark Klein, MD;  Location: [facility];  Service: General;  Laterality: N/A;  . COLONOSCOPY WITH PROPOFOL N/A 01/30/2013   Procedure: COLONOSCOPY WITH PROPOFOL;  Surgeon: Irene Shipper, MD;  Location: [facility] ENDOSCOPY;  Service: Endoscopy;  Laterality: N/A;  . ERCP N/A 12/08/2016   Procedure: ENDOSCOPIC RETROGRADE CHOLANGIOPANCREATOGRAPHY (ERCP);  Surgeon: Ladene Artist, MD;  Location: [facility];  Service: Endoscopy;  Laterality: N/A;  . ESOPHAGOGASTRODUODENOSCOPY (EGD) WITH PROPOFOL N/A 01/30/2013   Procedure: ESOPHAGOGASTRODUODENOSCOPY (EGD) WITH PROPOFOL;  Surgeon: Irene Shipper, MD;  Location: [facility] ENDOSCOPY;  Service: Endoscopy;  Laterality: N/A;  . LEFT HEART CATH AND CORONARY ANGIOGRAPHY N/A 10/18/2017   Procedure: LEFT HEART CATH AND CORONARY ANGIOGRAPHY;  Surgeon: Martinique, Peter M, MD;  Location: [place] CV LAB;  Service: Cardiovascular;  Laterality: N/A;  . LOOP RECORDER INSERTION N/A 03/18/2017   Procedure: Loop Recorder Insertion;  Surgeon: Constance Haw, MD;  Location: [place]  CV LAB;  Service: Cardiovascular;  Laterality: N/A;  . PARTIAL HYSTERECTOMY    . TEE WITHOUT CARDIOVERSION N/A 03/16/2017   Procedure: TRANSESOPHAGEAL ECHOCARDIOGRAM (TEE);  Surgeon: Acie Fredrickson Wonda Cheng, MD;  Location: [facility] ENDOSCOPY;  Service: Cardiovascular;  Laterality: N/A;  . TOTAL KNEE ARTHROPLASTY Right 04/20/2014   Procedure: RIGHT TOTAL KNEE ARTHROPLASTY CONVERTED TO RIGHT KNEE REIMPLANTATION;  Surgeon: Mcarthur Rossetti, MD;  Location: [facility] ORS;  Service: Orthopedics;  Laterality: Right;  . TOTAL KNEE ARTHROPLASTY Left 08/23/2015   Procedure: LEFT TOTAL KNEE  ARTHROPLASTY;  Surgeon: Mcarthur Rossetti, MD;  Location: [facility];  Service: Orthopedics;  Laterality: Left;  Marland Kitchen VENTRAL HERNIA REPAIR     x2     Current Outpatient Medications  Medication Sig Dispense Refill  . ACCU-CHEK SOFTCLIX LANCETS lancets Use as instructed once a day.  DX Code: E11.9 100 each 1  . allopurinol (ZYLOPRIM) 300 MG tablet Take 1 tablet (300 mg total) by mouth 2 (two) times daily. 180 tablet 0  . aspirin 81 MG EC tablet Take 1 tablet (81 mg total) by mouth daily.    . Calcium Carb-Cholecalciferol (CALCIUM 600-D PO) Take 1 tablet by mouth 2 (two) times daily before a meal.    . dexlansoprazole (DEXILANT) 60 MG capsule Take 1 capsule (60 mg total) by mouth 2 (two) times daily. 180 capsule 4  . dicyclomine (BENTYL) 10 MG capsule Take 1 capsule (10 mg total) by mouth in the morning and at bedtime. 60 capsule 2  . Emollient (UDDERLY SMOOTH) CREA Apply 1 application topically See admin instructions. Apply topically to skin folds as needed for rash/irritation    . famotidine (PEPCID) 20 MG tablet Take 20 mg by mouth daily.    . fenofibrate 160 MG tablet TAKE 1 TABLET BY MOUTH EVERY DAY 90 tablet 0  . ferrous sulfate 325 (65 FE) MG tablet Take 1 tablet (325 mg total) by mouth 2 (two) times daily. 180 tablet 0  . glucose blood (ACCU-CHEK AVIVA PLUS) test strip Use as instructed once a day.  DX Code E11.9 100 each 1  . isosorbide mononitrate (IMDUR) 30 MG 24 hr tablet Take 1 tablet (30 mg total) by mouth daily. 90 tablet 0  . nitroGLYCERIN (NITROSTAT) 0.4 MG SL tablet Place 1 tablet (0.4 mg total) under the tongue every 5 (five) minutes x 3 doses as needed for chest pain. 25 tablet 2  . olmesartan (BENICAR) 5 MG tablet Take 1 tablet (5 mg total) by mouth daily. 90 tablet 0  . rosuvastatin (CRESTOR) 40 MG tablet Take 1 tablet (40 mg total) by mouth daily. 90 tablet 3  . SitaGLIPtin-MetFORMIN HCl (JANUMET XR) [phone] MG TB24 Take 1 tablet by mouth daily. 90 tablet 0  . Vitamin D,  Ergocalciferol, (DRISDOL) 1.25 MG (50000 UNIT) CAPS capsule TAKE ONE TABLET WEEKLY 12 capsule 0   No current facility-administered medications for this visit.    Allergies:   Other and Penicillins    ROS:  Please see the history of present illness.   Otherwise, review of systems are positive for none.   All other systems are reviewed and negative.    PHYSICAL EXAM: VS:  BP 112/62 (BP Location: Left Arm, Patient Position: Sitting, Cuff Size: Large)   Pulse 88   Ht 5' 6"  (1.676 m)   Wt 224 lb (101.6 kg)   BMI 36.15 kg/m  , BMI Body mass index is 36.15 kg/m.  GENERAL:  Well appearing NECK:  No jugular venous distention, waveform  within normal limits, carotid upstroke brisk and symmetric, no bruits, no thyromegaly LUNGS:  Clear to auscultation bilaterally CHEST:  Unremarkable HEART:  PMI not displaced or sustained,S1 and S2 within normal limits, no S3, no S4, no clicks, no rubs, no murmurs ABD:  Flat, positive bowel sounds normal in frequency in pitch, no bruits, no rebound, no guarding, no midline pulsatile mass, no hepatomegaly, no splenomegaly EXT:  2 plus pulses throughout, no edema, no cyanosis no clubbing   EKG:  EKG is  ordered today. The ekg ordered today demonstrates sinus rhythm, rate 88, axis within normal limits, intervals within normal limits, no acute ST-T wave changes.   Recent Labs: 04/28/2020: B Natriuretic Peptide 37.5 09/24/2020: ALT 18; BUN 17; Creatinine, Ser 1.27; Hemoglobin 12.8; Magnesium 2.0; Platelets 198; Potassium 4.4; Sodium 139; TSH 2.840    Lipid Panel    Component Value Date/Time   CHOL 131 09/24/2020 0958   TRIG 240 (H) 09/24/2020 0958   HDL 32 (L) 09/24/2020 0958   CHOLHDL 4.1 09/24/2020 0958   CHOLHDL 4 12/21/2017 1044   VLDL 59.8 (H) 12/21/2017 1044   LDLCALC 60 09/24/2020 0958   LDLDIRECT 63 11/27/2019 1428   LDLDIRECT 50.0 12/21/2017 1044      Wt Readings from Last 3 Encounters:  01/14/21 224 lb (101.6 kg)  11/26/20 225 lb (102.1  kg)  11/20/20 230 lb 3.2 oz (104.4 kg)      Other studies Reviewed: Additional studies/ records that were reviewed today include: Hospital records Aug 2021.  Loop implant notes.. Review of the above records demonstrates:  Please see elsewhere in the note.     ASSESSMENT AND PLAN:  CAD:   The patient has no new sypmtoms.  No further cardiovascular testing is indicated.  We will continue with aggressive risk reduction and meds as listed.  HTN:    The blood pressure is at target. No change in medications is indicated. We will continue with therapeutic lifestyle changes (TLC).   STROKE: No clear etiology.  I reviewed the implant and there is no evidence of atrial fibrillation.  No change in therapy.  She has a small incidental PFO with no indication for closure.   PFO: As above  OBESITY: I am proud of her for her weight loss.  DM: Her A1c was excellent at 6.0.  No change in therapy.   Current medicines are reviewed at length with the patient today.  The patient does not have concerns regarding medicines.  The following changes have been made:  None  Labs/ tests ordered today include: None  Orders Placed This Encounter  Procedures  . EKG 12-Lead     Disposition:   FU with me in 18 months.      Signed, Minus Breeding, MD  01/14/2021 5:08 PM    [place]

## 2021-01-14 ENCOUNTER — Encounter: Payer: Self-pay | Admitting: Cardiology

## 2021-01-14 ENCOUNTER — Other Ambulatory Visit: Payer: Self-pay

## 2021-01-14 ENCOUNTER — Ambulatory Visit: Payer: BC Managed Care – PPO | Admitting: Cardiology

## 2021-01-14 VITALS — BP 112/62 | HR 88 | Ht 66.0 in | Wt 224.0 lb

## 2021-01-14 DIAGNOSIS — I25119 Atherosclerotic heart disease of native coronary artery with unspecified angina pectoris: Secondary | ICD-10-CM

## 2021-01-14 DIAGNOSIS — I10 Essential (primary) hypertension: Secondary | ICD-10-CM | POA: Diagnosis not present

## 2021-01-14 DIAGNOSIS — I639 Cerebral infarction, unspecified: Secondary | ICD-10-CM

## 2021-01-14 NOTE — Patient Instructions (Signed)
Medication Instructions:  Continue current medications  *If you need a refill on your cardiac medications before your next appointment, please call your pharmacy*   Lab Work: None Ordered   Testing/Procedures: None Ordered   Follow-Up: At [facility], you and your health needs are our priority.  As part of our continuing mission to provide you with exceptional heart care, we have created designated Provider Care Teams.  These Care Teams include your primary Cardiologist (physician) and Advanced Practice Providers (APPs -  Physician Assistants and Nurse Practitioners) who all work together to provide you with the care you need, when you need it.  We recommend signing up for the patient portal called "MyChart".  Sign up information is provided on this After Visit Summary.  MyChart is used to connect with patients for Virtual Visits (Telemedicine).  Patients are able to view lab/test results, encounter notes, upcoming appointments, etc.  Non-urgent messages can be sent to your provider as well.   To learn more about what you can do with MyChart, go to [email or web address].    Your next appointment:   18 month(s)  The format for your next appointment:   In Person  Provider:   You may see Minus Breeding, MD or one of the following Advanced Practice Providers on your designated Care Team:    Rosaria Ferries, PA-C  Jory Sims, DNP, ANP

## 2021-01-27 ENCOUNTER — Telehealth: Payer: Self-pay | Admitting: Podiatry

## 2021-01-27 NOTE — Telephone Encounter (Signed)
Pt left message checking on diabetic shoes.  Returned call and spoke to pt to let her know the company website is down so I am not able to check status but I will call pt when it comes back up.

## 2021-01-30 ENCOUNTER — Other Ambulatory Visit: Payer: Self-pay | Admitting: Physician Assistant

## 2021-01-30 ENCOUNTER — Other Ambulatory Visit: Payer: Self-pay | Admitting: Adult Health

## 2021-01-30 DIAGNOSIS — E785 Hyperlipidemia, unspecified: Secondary | ICD-10-CM

## 2021-01-30 DIAGNOSIS — I152 Hypertension secondary to endocrine disorders: Secondary | ICD-10-CM

## 2021-01-30 DIAGNOSIS — E1169 Type 2 diabetes mellitus with other specified complication: Secondary | ICD-10-CM

## 2021-01-30 DIAGNOSIS — E559 Vitamin D deficiency, unspecified: Secondary | ICD-10-CM

## 2021-02-06 ENCOUNTER — Other Ambulatory Visit: Payer: Self-pay | Admitting: Gastroenterology

## 2021-02-06 DIAGNOSIS — K317 Polyp of stomach and duodenum: Secondary | ICD-10-CM

## 2021-02-11 ENCOUNTER — Telehealth: Payer: Self-pay | Admitting: Podiatry

## 2021-02-11 NOTE — Telephone Encounter (Signed)
Thank you for the update!

## 2021-02-11 NOTE — Telephone Encounter (Signed)
Pt left message on 5.26 checking on diabetic shoes.  I returned call and explained to pt we have not received the needed documents from the doctor. She said she may just have to go somewhere else since we cannot get the documents needed. I explained to pt that if she goes somewhere else she will have the same issue because it is medicare policy that the pcp or doctor treating the diabetes has to sign off. I told pt to call and she said she has and is not sure what is going on. I am going to fax it today with a note.

## 2021-02-14 ENCOUNTER — Telehealth: Payer: Self-pay | Admitting: Podiatry

## 2021-02-14 NOTE — Telephone Encounter (Signed)
Pt left message asking if we had gotten the paperwork from the pcp for the diabetic shoes.  I returned call and told pt I have not received the documents yet but refaxed it again today. She said she is about ready to give up on getting the shoes. I apologized and asked if she seen a endocrinologist for her diabetes and she said no.

## 2021-02-15 ENCOUNTER — Other Ambulatory Visit: Payer: Self-pay | Admitting: Physician Assistant

## 2021-02-15 DIAGNOSIS — I152 Hypertension secondary to endocrine disorders: Secondary | ICD-10-CM

## 2021-02-15 DIAGNOSIS — E1159 Type 2 diabetes mellitus with other circulatory complications: Secondary | ICD-10-CM

## 2021-03-15 ENCOUNTER — Other Ambulatory Visit: Payer: Self-pay | Admitting: Cardiology

## 2021-03-16 ENCOUNTER — Other Ambulatory Visit: Payer: Self-pay | Admitting: Physician Assistant

## 2021-03-16 DIAGNOSIS — E785 Hyperlipidemia, unspecified: Secondary | ICD-10-CM

## 2021-03-19 ENCOUNTER — Ambulatory Visit: Payer: Medicare Other | Admitting: Podiatry

## 2021-03-20 ENCOUNTER — Ambulatory Visit: Payer: BC Managed Care – PPO | Admitting: Physician Assistant

## 2021-04-15 ENCOUNTER — Encounter: Payer: Self-pay | Admitting: Podiatry

## 2021-04-15 ENCOUNTER — Ambulatory Visit (INDEPENDENT_AMBULATORY_CARE_PROVIDER_SITE_OTHER): Payer: BC Managed Care – PPO | Admitting: Podiatry

## 2021-04-15 ENCOUNTER — Other Ambulatory Visit: Payer: Self-pay

## 2021-04-15 DIAGNOSIS — B351 Tinea unguium: Secondary | ICD-10-CM

## 2021-04-15 DIAGNOSIS — Q828 Other specified congenital malformations of skin: Secondary | ICD-10-CM | POA: Diagnosis not present

## 2021-04-15 DIAGNOSIS — Q666 Other congenital valgus deformities of feet: Secondary | ICD-10-CM

## 2021-04-15 DIAGNOSIS — M79675 Pain in left toe(s): Secondary | ICD-10-CM | POA: Diagnosis not present

## 2021-04-15 DIAGNOSIS — M2141 Flat foot [pes planus] (acquired), right foot: Secondary | ICD-10-CM | POA: Diagnosis not present

## 2021-04-15 DIAGNOSIS — E1169 Type 2 diabetes mellitus with other specified complication: Secondary | ICD-10-CM | POA: Diagnosis not present

## 2021-04-15 DIAGNOSIS — E119 Type 2 diabetes mellitus without complications: Secondary | ICD-10-CM | POA: Diagnosis not present

## 2021-04-15 DIAGNOSIS — M2142 Flat foot [pes planus] (acquired), left foot: Secondary | ICD-10-CM | POA: Diagnosis not present

## 2021-04-15 DIAGNOSIS — M79674 Pain in right toe(s): Secondary | ICD-10-CM

## 2021-04-20 NOTE — Progress Notes (Signed)
Subjective: Rachel Black is a pleasant 70 y.o. female patient seen today painful thick toenails that are difficult to trim. Pain interferes with ambulation. Aggravating factors include wearing enclosed shoe gear. Pain is relieved with periodic professional debridement.  Patient is diabetic. Her blood glucose was 108 mg/dl this morning. She is accompanied by her husband on today's visit.  Rachel Black is also here to pick up her diabetic shoes on today's visit.  PCP is Rachel Reid, PA-C. Last visit was: 11/20/2020.  Allergies  Allergen Reactions   Other Other (See Comments)    SOME BANDAIDS CAUSE SKIN IRRITATION   Penicillins Itching    Has patient had a PCN reaction causing immediate rash, facial/tongue/throat swelling, SOB or lightheadedness with hypotension: No Has patient had a PCN reaction causing severe rash involving mucus membranes or skin necrosis: No Has patient had a PCN reaction that required hospitalization No Has patient had a PCN reaction occurring within the last 10 years: No If all of the above answers are "NO", then may proceed with Cephalosporin use.     Objective: Physical Exam  General: Rachel Black is a pleasant 70 y.o. Caucasian female, morbidly obese in NAD. AAO x 3.   Vascular:  Capillary refill time to digits immediate b/l. Palpable pedal pulses b/l LE. Pedal hair sparse. Lower extremity skin temperature gradient within normal limits. No pain with calf compression b/l.  Dermatological:  Pedal skin with normal turgor, texture and tone b/l lower extremities. No open wounds b/l lower extremities. No interdigital macerations b/l lower extremities. Toenails 1-5 b/l elongated, discolored, dystrophic, thickened, crumbly with subungual debris and tenderness to dorsal palpation. Porokeratotic lesion(s) submet head 1 right foot. No erythema, no edema, no drainage, no fluctuance.  Musculoskeletal:  Normal muscle strength 5/5 to all lower extremity muscle groups  bilaterally. No pain crepitus or joint limitation noted with ROM b/l lower extremities. Pes planus deformity noted b/l lower extremities.  Neurological:  Protective sensation intact 5/5 intact bilaterally with 10g monofilament b/l. Vibratory sensation intact b/l.  Assessment and Plan:  1. Pain due to onychomycosis of toenails of both feet   2. Porokeratosis   3. Pes planovalgus   4. Type 2 diabetes mellitus with other specified complication, without long-term current use of insulin (HCC)   -Examined patient. -Patient to continue soft, supportive shoe gear daily. -Toenails 1-5 b/l were debrided in length and girth with sterile nail nippers and dremel without iatrogenic bleeding. -Porokeratosis submet head 1 right foot pared and enucleated with sterile scalpel blade without incident.  -Dispensed one pair diabetic shoes and 3 pair total contact insoles. Shoes were appropriate fit with no heel slippage. Reviewed warranty information and patient signed all paperwork stating patient received shoes, insert(s)/filler(s), break-in instructions and warranty information. Patient instructed not to wear shoes outside unless completely satisfied. Patient related understanding.  -Patient to report any pedal injuries to medical professional immediately. -Patient/POA to call should there be question/concern in the interim.  Return in about 3 months (around 07/16/2021).  Marzetta Board, DPM

## 2021-04-21 ENCOUNTER — Other Ambulatory Visit: Payer: Self-pay | Admitting: Physician Assistant

## 2021-04-21 DIAGNOSIS — E559 Vitamin D deficiency, unspecified: Secondary | ICD-10-CM

## 2021-04-22 ENCOUNTER — Telehealth: Payer: Self-pay | Admitting: Physician Assistant

## 2021-04-22 NOTE — Telephone Encounter (Signed)
Please contact patient to schedule apt per last AVS for further medication refills. AS, CMA

## 2021-04-28 ENCOUNTER — Other Ambulatory Visit: Payer: Self-pay

## 2021-04-28 ENCOUNTER — Encounter: Payer: Self-pay | Admitting: Physician Assistant

## 2021-04-28 ENCOUNTER — Ambulatory Visit (INDEPENDENT_AMBULATORY_CARE_PROVIDER_SITE_OTHER): Payer: BC Managed Care – PPO | Admitting: Physician Assistant

## 2021-04-28 VITALS — BP 108/67 | HR 81 | Temp 97.6°F | Ht 66.0 in | Wt 217.0 lb

## 2021-04-28 DIAGNOSIS — I152 Hypertension secondary to endocrine disorders: Secondary | ICD-10-CM

## 2021-04-28 DIAGNOSIS — E782 Mixed hyperlipidemia: Secondary | ICD-10-CM | POA: Diagnosis not present

## 2021-04-28 DIAGNOSIS — E1169 Type 2 diabetes mellitus with other specified complication: Secondary | ICD-10-CM | POA: Diagnosis not present

## 2021-04-28 DIAGNOSIS — E1159 Type 2 diabetes mellitus with other circulatory complications: Secondary | ICD-10-CM

## 2021-04-28 DIAGNOSIS — Z6835 Body mass index (BMI) 35.0-35.9, adult: Secondary | ICD-10-CM

## 2021-04-28 LAB — POCT GLYCOSYLATED HEMOGLOBIN (HGB A1C): Hemoglobin A1C: 5.7 % — AB (ref 4.0–5.6)

## 2021-04-28 MED ORDER — SITAGLIP PHOS-METFORMIN HCL ER 50-500 MG PO TB24: 1.0000 | ORAL_TABLET | Freq: Every day | ORAL | 1 refills | Status: DC

## 2021-04-28 NOTE — Progress Notes (Signed)
Established Patient Office Visit  Subjective:  Patient ID: Rachel Black, female    DOB: [birth year]  Age: 70 y.o. MRN: [id number]  CC:  Chief Complaint  Patient presents with   Diabetes   Hypertension    HPI Rachel Black presents for follow up on diabetes mellitus, hypertension and hyperlipidemia.  Diabetes: Pt denies increased urination or thirst. Pt reports medication compliance. No hypoglycemic events. Checking glucose at home. FBS range 104-107. States has not reduced her sweet intake. States already obtained annual diabetic eye exam this year with Dr. Georgina Quint.  HTN: Pt denies chest pain, palpitations, dizziness or leg swelling. Taking medication as directed without side effects. Checks BP at home and readings range 119-160/80-90. Pt follows a low salt diet.  HLD: Pt does not have cholesterol medication in her medicine bag. Reports unsure if taking.  Weight: Patient states has not made significant diet changes but has reduced calorie intake and working on portion control.  Past Medical History:  Diagnosis Date   Arthritis    PAIN AND OA BOTH KNEES AND SHOULDERS AND ELBOWS AND WRIST   Blood transfusion without reported diagnosis 2018   after cholecystectomy   Diabetes mellitus    ORAL MEDICATION   GERD (gastroesophageal reflux disease)    Gout    NO RECENT FLARE UPS   H/O: rheumatic fever    AS A CHILD - NO KNOWN HEART MURMUR OR HEART PROBLEMS   Heart attack ([place])    Hyperlipidemia    Hypertension    Stroke ([place]) 03/2017    Past Surgical History:  Procedure Laterality Date   ABDOMINAL HYSTERECTOMY     CESAREAN SECTION     CHOLECYSTECTOMY N/A 12/09/2016   Procedure: LAPAROSCOPIC CHOLECYSTECTOMY WITH INTRAOPERATIVE CHOLANGIOGRAM;  Surgeon: Stark Klein, MD;  Location: [facility];  Service: General;  Laterality: N/A;   COLONOSCOPY WITH PROPOFOL N/A 01/30/2013   Procedure: COLONOSCOPY WITH PROPOFOL;  Surgeon: Irene Shipper, MD;  Location: [facility];  Service:  Endoscopy;  Laterality: N/A;   ERCP N/A 12/08/2016   Procedure: ENDOSCOPIC RETROGRADE CHOLANGIOPANCREATOGRAPHY (ERCP);  Surgeon: Ladene Artist, MD;  Location: [facility];  Service: Endoscopy;  Laterality: N/A;   ESOPHAGOGASTRODUODENOSCOPY (EGD) WITH PROPOFOL N/A 01/30/2013   Procedure: ESOPHAGOGASTRODUODENOSCOPY (EGD) WITH PROPOFOL;  Surgeon: Irene Shipper, MD;  Location: [facility];  Service: Endoscopy;  Laterality: N/A;   LEFT HEART CATH AND CORONARY ANGIOGRAPHY N/A 10/18/2017   Procedure: LEFT HEART CATH AND CORONARY ANGIOGRAPHY;  Surgeon: Martinique, Peter M, MD;  Location: [facility];  Service: Cardiovascular;  Laterality: N/A;   LOOP RECORDER INSERTION N/A 03/18/2017   Procedure: Loop Recorder Insertion;  Surgeon: Constance Haw, MD;  Location: [facility];  Service: Cardiovascular;  Laterality: N/A;   PARTIAL HYSTERECTOMY     TEE WITHOUT CARDIOVERSION N/A 03/16/2017   Procedure: TRANSESOPHAGEAL ECHOCARDIOGRAM (TEE);  Surgeon: Acie Fredrickson Wonda Cheng, MD;  Location: [facility];  Service: Cardiovascular;  Laterality: N/A;   TOTAL KNEE ARTHROPLASTY Right 04/20/2014   Procedure: RIGHT TOTAL KNEE ARTHROPLASTY CONVERTED TO RIGHT KNEE REIMPLANTATION;  Surgeon: Mcarthur Rossetti, MD;  Location: [facility];  Service: Orthopedics;  Laterality: Right;   TOTAL KNEE ARTHROPLASTY Left 08/23/2015   Procedure: LEFT TOTAL KNEE ARTHROPLASTY;  Surgeon: Mcarthur Rossetti, MD;  Location: [facility];  Service: Orthopedics;  Laterality: Left;   VENTRAL HERNIA REPAIR     x2    Family History  Problem Relation Age of Onset   Diabetes Mother  Hypertension Mother        entire family   Breast cancer Mother        diagnosed in her 3's   Stroke Father        CVA   Hyperlipidemia Father        entire family   Diabetes Father    Heart disease Father        No details.  Not at an early age.     Crohn's disease Son    Irritable bowel syndrome Son     Social History   Socioeconomic History    Marital status: Married    Spouse name: Not on file   Number of children: 2   Years of education: Not on file   Highest education level: Not on file  Occupational History   Occupation: housewife    Employer: UNEMPLOYED  Tobacco Use   Smoking status: Never   Smokeless tobacco: Never  Substance and Sexual Activity   Alcohol use: No   Drug use: No   Sexual activity: Yes    Partners: Male  Other Topics Concern   Not on file  Social History Narrative   No exercise secondary to knee pain   Social Determinants of Health   Financial Resource Strain: Not on file  Food Insecurity: Not on file  Transportation Needs: Not on file  Physical Activity: Not on file  Stress: Not on file  Social Connections: Not on file  Intimate Partner Violence: Not on file    Outpatient Medications Prior to Visit  Medication Sig Dispense Refill   ACCU-CHEK SOFTCLIX LANCETS lancets Use as instructed once a day.  DX Code: E11.9 100 each 1   aspirin 81 MG EC tablet Take 1 tablet (81 mg total) by mouth daily.     Calcium Carb-Cholecalciferol (CALCIUM 600-D PO) Take 1 tablet by mouth 2 (two) times daily before a meal.     dexlansoprazole (DEXILANT) 60 MG capsule Take 1 capsule (60 mg total) by mouth 2 (two) times daily. 180 capsule 4   dicyclomine (BENTYL) 10 MG capsule Take 1 capsule (10 mg total) by mouth in the morning and at bedtime. 60 capsule 2   Emollient (UDDERLY SMOOTH) CREA Apply 1 application topically See admin instructions. Apply topically to skin folds as needed for rash/irritation     famotidine (PEPCID) 20 MG tablet Take 20 mg by mouth daily.     fenofibrate 160 MG tablet TAKE 1 TABLET BY MOUTH EVERY DAY 90 tablet 0   ferrous sulfate 325 (65 FE) MG tablet TAKE 1 TABLET BY MOUTH TWICE A DAY 180 tablet 0   glucose blood (ACCU-CHEK AVIVA PLUS) test strip Use as instructed once a day.  DX Code E11.9 100 each 1   isosorbide mononitrate (IMDUR) 30 MG 24 hr tablet TAKE 1 TABLET BY MOUTH EVERY DAY 90  tablet 0   nitroGLYCERIN (NITROSTAT) 0.4 MG SL tablet Place 1 tablet (0.4 mg total) under the tongue every 5 (five) minutes x 3 doses as needed for chest pain. 25 tablet 2   olmesartan (BENICAR) 5 MG tablet TAKE 1 TABLET (5 MG TOTAL) BY MOUTH DAILY. 90 tablet 0   rosuvastatin (CRESTOR) 40 MG tablet TAKE 1 TABLET BY MOUTH EVERY DAY 90 tablet 3   Vitamin D, Ergocalciferol, (DRISDOL) 1.25 MG (50000 UNIT) CAPS capsule **NEEDS APT FOR REFILLS**TAKE 1 CAPSULE BY MOUTH ONE TIME PER WEEK 4 capsule 0   allopurinol (ZYLOPRIM) 300 MG tablet Take 1 tablet (300 mg  total) by mouth 2 (two) times daily. 180 tablet 0   SitaGLIPtin-MetFORMIN HCl (JANUMET XR) [phone] MG TB24 Take 1 tablet by mouth daily. 90 tablet 0   No facility-administered medications prior to visit.    Allergies  Allergen Reactions   Other Other (See Comments)    SOME BANDAIDS CAUSE SKIN IRRITATION   Penicillins Itching    Has patient had a PCN reaction causing immediate rash, facial/tongue/throat swelling, SOB or lightheadedness with hypotension: No Has patient had a PCN reaction causing severe rash involving mucus membranes or skin necrosis: No Has patient had a PCN reaction that required hospitalization No Has patient had a PCN reaction occurring within the last 10 years: No If all of the above answers are "NO", then may proceed with Cephalosporin use.     ROS Review of Systems Review of Systems:  A fourteen system review of systems was performed and found to be positive as per HPI.  Objective:    Physical Exam General:  Pleasant and cooperative, in no acute distress  Neuro:  Alert and oriented,  extra-ocular muscles intact  HEENT:  Normocephalic, atraumatic, neck supple Skin:  no gross rash, warm, pink. Cardiac:  RRR Respiratory:  CTA B/L, Not using accessory muscles, speaking in full sentences- unlabored. Vascular:  Ext warm, no cyanosis apprec.; cap RF less 2 sec. Psych:  No HI/SI, judgement and insight good, Euthymic  mood. Full Affect.  BP 108/67   Pulse 81   Temp 97.6 F (36.4 C)   Ht 5' 6"  (1.676 m)   Wt 217 lb (98.4 kg)   SpO2 97%   BMI 35.02 kg/m  Wt Readings from Last 3 Encounters:  04/28/21 217 lb (98.4 kg)  01/14/21 224 lb (101.6 kg)  11/26/20 225 lb (102.1 kg)     Health Maintenance Due  Topic Date Due   COVID-19 Vaccine (1) Never done   COLONOSCOPY (Pts 45-2yr Insurance coverage will need to be confirmed)  01/30/2014   OPHTHALMOLOGY EXAM  03/27/2015   PNA vac Low Risk Adult (2 of 2 - PPSV23) 09/21/2018   MAMMOGRAM  01/05/2019   INFLUENZA VACCINE  04/14/2021    There are no preventive care reminders to display for this patient.  Lab Results  Component Value Date   TSH 2.840 09/24/2020   Lab Results  Component Value Date   WBC 6.7 09/24/2020   HGB 12.8 09/24/2020   HCT 38.5 09/24/2020   MCV 92 09/24/2020   PLT 198 09/24/2020   Lab Results  Component Value Date   NA 139 09/24/2020   K 4.4 09/24/2020   CO2 24 09/24/2020   GLUCOSE 131 (H) 09/24/2020   BUN 17 09/24/2020   CREATININE 1.27 (H) 09/24/2020   BILITOT 0.4 09/24/2020   ALKPHOS 57 09/24/2020   AST 26 09/24/2020   ALT 18 09/24/2020   PROT 6.1 09/24/2020   ALBUMIN 4.2 09/24/2020   CALCIUM 10.3 09/24/2020   ANIONGAP 15 04/29/2020   GFR 39.40 (L) 12/21/2017   Lab Results  Component Value Date   CHOL 131 09/24/2020   Lab Results  Component Value Date   HDL 32 (L) 09/24/2020   Lab Results  Component Value Date   LDLCALC 60 09/24/2020   Lab Results  Component Value Date   TRIG 240 (H) 09/24/2020   Lab Results  Component Value Date   CHOLHDL 4.1 09/24/2020   Lab Results  Component Value Date   HGBA1C 5.7 (A) 04/28/2021      Assessment &  Plan:   Problem List Items Addressed This Visit       Cardiovascular and Mediastinum   Hypertension associated with diabetes ([place])   Relevant Medications   SitaGLIPtin-MetFORMIN HCl 50-500 MG TB24   Coronary artery disease involving native  coronary artery of native heart with angina pectoris (HCC)     Endocrine   Diabetes mellitus ([place]) - Primary (Chronic)   Relevant Medications   SitaGLIPtin-MetFORMIN HCl 50-500 MG TB24   Other Relevant Orders   POCT glycosylated hemoglobin (Hb A1C) (Completed)   Mixed diabetic hyperlipidemia associated with type 2 diabetes mellitus (HCC)   Relevant Medications   SitaGLIPtin-MetFORMIN HCl 50-500 MG TB24   Other Visit Diagnoses     Class 2 severe obesity with serious comorbidity and body mass index (BMI) of 35.0 to 35.9 in adult, unspecified obesity type (HCC)       Relevant Medications   SitaGLIPtin-MetFORMIN HCl 50-500 MG TB24      Diabetes mellitus: -A1c today 5.7, improved from 6.0. Discussed with patient adjusting Janumet XR to reduce risk for hypoglycemia and is agreeable to decrease dose to 50-500 mg. New rx sent. -Encourage to continue weight loss efforts and ambulatory glucose monitoring. -Will request eye exam. -Will continue to monitor.  Hypertension associated with diabetes: -BP in office at goal, recommend to bring home BP device at next office visit to compare.  -Continue current medication regimen. -Will repeat CMP with CPE.  Mixed diabetic hyperlipidemia associated with type 2 diabetes mellitus: -Followed by cardiology. -Last lipid panel: total cholesterol 131, triglycerides 240, HDL 32, LDL 60 -Recommend to double check at home rx for rosuvastatin 40 mg. Dr. Percival Spanish last filled 01/30/2021 for 90 day supple with 3 RF. -Will repeat lipid panel and hepatic function with CPE.  Class 2 severe obesity with serious comorbidity and body mass index (BMI) of 35.0 to 35.9 in adult, unspecified obesity type: -Associated with diabetes mellitus, hypertension and hyperlipidemia. -Praised patient for 13 pound weight loss since last visit. -Recommend to continue with portion control and reduce simple carbohydrates.  Meds ordered this encounter  Medications    SitaGLIPtin-MetFORMIN HCl 50-500 MG TB24    Sig: Take 1 tablet by mouth daily.    Dispense:  90 tablet    Refill:  1    Order Specific Question:   Supervising Provider    Answer:   Beatrice Lecher D [[id number]]    Follow-up: Return in about 4 months (around 08/28/2021) for CPE and FBW include Vit D.    Lorrene Reid, PA-C

## 2021-04-28 NOTE — Patient Instructions (Signed)
Diabetes Mellitus and Nutrition, Adult When you have diabetes, or diabetes mellitus, it is very important to have healthy eating habits because your blood sugar (glucose) levels are greatly affected by what you eat and drink. Eating healthy foods in the right amounts, at about the same times every day, can help you:  Control your blood glucose.  Lower your risk of heart disease.  Improve your blood pressure.  Reach or maintain a healthy weight. What can affect my meal plan? Every person with diabetes is different, and each person has different needs for a meal plan. Your health care provider may recommend that you work with a dietitian to make a meal plan that is best for you. Your meal plan may vary depending on factors such as:  The calories you need.  The medicines you take.  Your weight.  Your blood glucose, blood pressure, and cholesterol levels.  Your activity level.  Other health conditions you have, such as heart or kidney disease. How do carbohydrates affect me? Carbohydrates, also called carbs, affect your blood glucose level more than any other type of food. Eating carbs naturally raises the amount of glucose in your blood. Carb counting is a method for keeping track of how many carbs you eat. Counting carbs is important to keep your blood glucose at a healthy level, especially if you use insulin or take certain oral diabetes medicines. It is important to know how many carbs you can safely have in each meal. This is different for every person. Your dietitian can help you calculate how many carbs you should have at each meal and for each snack. How does alcohol affect me? Alcohol can cause a sudden decrease in blood glucose (hypoglycemia), especially if you use insulin or take certain oral diabetes medicines. Hypoglycemia can be a life-threatening condition. Symptoms of hypoglycemia, such as sleepiness, dizziness, and confusion, are similar to symptoms of having too much  alcohol.  Do not drink alcohol if: ? Your health care provider tells you not to drink. ? You are pregnant, may be pregnant, or are planning to become pregnant.  If you drink alcohol: ? Do not drink on an empty stomach. ? Limit how much you use to:  0-1 drink a day for women.  0-2 drinks a day for men. ? Be aware of how much alcohol is in your drink. In the [place] one drink equals one 12 oz bottle of beer (355 mL), one 5 oz glass of wine (148 mL), or one 1 oz glass of hard liquor (44 mL). ? Keep yourself hydrated with water, diet soda, or unsweetened iced tea.  Keep in mind that regular soda, juice, and other mixers may contain a lot of sugar and must be counted as carbs. What are tips for following this plan? Reading food labels  Start by checking the serving size on the "Nutrition Facts" label of packaged foods and drinks. The amount of calories, carbs, fats, and other nutrients listed on the label is based on one serving of the item. Many items contain more than one serving per package.  Check the total grams (g) of carbs in one serving. You can calculate the number of servings of carbs in one serving by dividing the total carbs by 15. For example, if a food has 30 g of total carbs per serving, it would be equal to 2 servings of carbs.  Check the number of grams (g) of saturated fats and trans fats in one serving. Choose foods that have   a low amount or none of these fats.  Check the number of milligrams (mg) of salt (sodium) in one serving. Most people should limit total sodium intake to less than 2,300 mg per day.  Always check the nutrition information of foods labeled as "low-fat" or "nonfat." These foods may be higher in added sugar or refined carbs and should be avoided.  Talk to your dietitian to identify your daily goals for nutrients listed on the label. Shopping  Avoid buying canned, pre-made, or processed foods. These foods tend to be high in fat, sodium, and added  sugar.  Shop around the outside edge of the grocery store. This is where you will most often find fresh fruits and vegetables, bulk grains, fresh meats, and fresh dairy. Cooking  Use low-heat cooking methods, such as baking, instead of high-heat cooking methods like deep frying.  Cook using healthy oils, such as olive, canola, or sunflower oil.  Avoid cooking with butter, cream, or high-fat meats. Meal planning  Eat meals and snacks regularly, preferably at the same times every day. Avoid going long periods of time without eating.  Eat foods that are high in fiber, such as fresh fruits, vegetables, beans, and whole grains. Talk with your dietitian about how many servings of carbs you can eat at each meal.  Eat 4-6 oz (112-168 g) of lean protein each day, such as lean meat, chicken, fish, eggs, or tofu. One ounce (oz) of lean protein is equal to: ? 1 oz (28 g) of meat, chicken, or fish. ? 1 egg. ?  cup (62 g) of tofu.  Eat some foods each day that contain healthy fats, such as avocado, nuts, seeds, and fish.   What foods should I eat? Fruits Berries. Apples. Oranges. Peaches. Apricots. Plums. Grapes. Mango. Papaya. Pomegranate. Kiwi. Cherries. Vegetables Lettuce. Spinach. Leafy greens, including kale, chard, collard greens, and mustard greens. Beets. Cauliflower. Cabbage. Broccoli. Carrots. Green beans. Tomatoes. Peppers. Onions. Cucumbers. Brussels sprouts. Grains Whole grains, such as whole-wheat or whole-grain bread, crackers, tortillas, cereal, and pasta. Unsweetened oatmeal. Quinoa. Brown or wild rice. Meats and other proteins Seafood. Poultry without skin. Lean cuts of poultry and beef. Tofu. Nuts. Seeds. Dairy Low-fat or fat-free dairy products such as milk, yogurt, and cheese. The items listed above may not be a complete list of foods and beverages you can eat. Contact a dietitian for more information. What foods should I avoid? Fruits Fruits canned with  syrup. Vegetables Canned vegetables. Frozen vegetables with butter or cream sauce. Grains Refined white flour and flour products such as bread, pasta, snack foods, and cereals. Avoid all processed foods. Meats and other proteins Fatty cuts of meat. Poultry with skin. Breaded or fried meats. Processed meat. Avoid saturated fats. Dairy Full-fat yogurt, cheese, or milk. Beverages Sweetened drinks, such as soda or iced tea. The items listed above may not be a complete list of foods and beverages you should avoid. Contact a dietitian for more information. Questions to ask a health care provider  Do I need to meet with a diabetes educator?  Do I need to meet with a dietitian?  What number can I call if I have questions?  When are the best times to check my blood glucose? Where to find more information:  American Diabetes Association: diabetes.org  Academy of Nutrition and Dietetics: www.eatright.org  National Institute of Diabetes and Digestive and Kidney Diseases: www.niddk.nih.gov  Association of Diabetes Care and Education Specialists: www.diabeteseducator.org Summary  It is important to have healthy eating   habits because your blood sugar (glucose) levels are greatly affected by what you eat and drink.  A healthy meal plan will help you control your blood glucose and maintain a healthy lifestyle.  Your health care provider may recommend that you work with a dietitian to make a meal plan that is best for you.  Keep in mind that carbohydrates (carbs) and alcohol have immediate effects on your blood glucose levels. It is important to count carbs and to use alcohol carefully. This information is not intended to replace advice given to you by your health care provider. Make sure you discuss any questions you have with your health care provider. Document Revised: 08/08/2019 Document Reviewed: 08/08/2019 Elsevier Patient Education  2021 Elsevier Inc.  

## 2021-04-29 ENCOUNTER — Other Ambulatory Visit: Payer: Self-pay | Admitting: Physician Assistant

## 2021-04-29 DIAGNOSIS — M1000 Idiopathic gout, unspecified site: Secondary | ICD-10-CM

## 2021-05-06 ENCOUNTER — Telehealth: Payer: Self-pay | Admitting: Gastroenterology

## 2021-05-06 NOTE — Telephone Encounter (Signed)
Patient reports several weeks of lower abdominal cramping and diarrhea for several hours.  She will come in and see Dr. Henrene Pastor on 05/09/21 9:00

## 2021-05-06 NOTE — Telephone Encounter (Signed)
Pt dealing with abd pain would like some advise.

## 2021-05-06 NOTE — Telephone Encounter (Signed)
Left message for patient to call back  

## 2021-05-09 ENCOUNTER — Ambulatory Visit: Payer: BC Managed Care – PPO | Admitting: Internal Medicine

## 2021-05-09 ENCOUNTER — Other Ambulatory Visit: Payer: Self-pay | Admitting: Gastroenterology

## 2021-05-09 ENCOUNTER — Encounter: Payer: Self-pay | Admitting: Internal Medicine

## 2021-05-09 VITALS — BP 120/70 | HR 67 | Ht 66.0 in | Wt 214.0 lb

## 2021-05-09 DIAGNOSIS — K219 Gastro-esophageal reflux disease without esophagitis: Secondary | ICD-10-CM

## 2021-05-09 DIAGNOSIS — K317 Polyp of stomach and duodenum: Secondary | ICD-10-CM

## 2021-05-09 DIAGNOSIS — R1084 Generalized abdominal pain: Secondary | ICD-10-CM | POA: Diagnosis not present

## 2021-05-09 DIAGNOSIS — E611 Iron deficiency: Secondary | ICD-10-CM

## 2021-05-09 DIAGNOSIS — R197 Diarrhea, unspecified: Secondary | ICD-10-CM

## 2021-05-09 MED ORDER — HYOSCYAMINE SULFATE 0.125 MG SL SUBL: 0.1250 mg | SUBLINGUAL_TABLET | SUBLINGUAL | 3 refills | Status: DC | PRN

## 2021-05-09 NOTE — Telephone Encounter (Signed)
Pt returned call. Advised Rx for Ferrous Sulfate denied, pending results of iron studies. Informed of new orders to complete labs and advised she is welcome to stop by our lab to complete at her convenience. Verbalized acceptance and understanding.

## 2021-05-09 NOTE — Telephone Encounter (Signed)
Received refill request for Ferrous Sulfate. Discussed with Dr. Henrene Pastor and Alonza Bogus, PA re: request for refill and if repeat labs would be required. Per providers, pt will require repeat CBC and iron studies PRIOR to refill. Rx refill has been denied at this time. New orders placed for CBC, IBC and Ferritin. Called pt to make her aware of new orders and rationale for denial. LVM requesting returned call.

## 2021-05-09 NOTE — Progress Notes (Signed)
HISTORY OF PRESENT ILLNESS:  Rachel Black is a 70 y.o. female with multiple significant medical problems who presents today with her husband for evaluation of intermittent problems with abdominal cramping followed by loose stools.  Patient states this has been worse over the past few weeks but has had this problem off and on for quite some time.  I questioned her regarding her medications (or other any symptoms associated with starting medicine such as metformin) but she has limited insight.  In any event, she describes about 4 bowel movements per day.  Mostly solid.  Occasionally she will have cramping followed by an urge to defecate.  No incontinence.  No nocturnal symptoms.  No bleeding.  She was last seen in this office March 2022 regarding GERD, this is same problem with abdominal cramping and loose stools, and perianal soreness.  She continues on Dexilant and Pepcid for GERD.  This works.  She is status post cholecystectomy in 2018.  At that time she also underwent ERCP with biliary sphincterotomy and common duct stone extraction.  Her last complete colonoscopy was May 2014 with left-sided diverticulosis and a diminutive adenoma.  She also has a history of a bleeding hyperplastic gastric polyp which was removed endoscopically in 2018.  CBC from January 2022 shows hemoglobin 12.8  REVIEW OF SYSTEMS:  All non-GI ROS negative unless otherwise stated in the HPI except for sleeping problems, anxiety, fatigue, confusion, urinary leakage,  Past Medical History:  Diagnosis Date   Arthritis    PAIN AND OA BOTH KNEES AND SHOULDERS AND ELBOWS AND WRIST   Blood transfusion without reported diagnosis 2018   after cholecystectomy   Diabetes mellitus    ORAL MEDICATION   GERD (gastroesophageal reflux disease)    Gout    NO RECENT FLARE UPS   H/O: rheumatic fever    AS A CHILD - NO KNOWN HEART MURMUR OR HEART PROBLEMS   Heart attack ([place])    Hyperlipidemia    Hypertension    Stroke ([place]) 03/2017     Past Surgical History:  Procedure Laterality Date   ABDOMINAL HYSTERECTOMY     CESAREAN SECTION     CHOLECYSTECTOMY N/A 12/09/2016   Procedure: LAPAROSCOPIC CHOLECYSTECTOMY WITH INTRAOPERATIVE CHOLANGIOGRAM;  Surgeon: Stark Klein, MD;  Location: [facility];  Service: General;  Laterality: N/A;   COLONOSCOPY WITH PROPOFOL N/A 01/30/2013   Procedure: COLONOSCOPY WITH PROPOFOL;  Surgeon: Irene Shipper, MD;  Location: [facility];  Service: Endoscopy;  Laterality: N/A;   ERCP N/A 12/08/2016   Procedure: ENDOSCOPIC RETROGRADE CHOLANGIOPANCREATOGRAPHY (ERCP);  Surgeon: Ladene Artist, MD;  Location: [facility];  Service: Endoscopy;  Laterality: N/A;   ESOPHAGOGASTRODUODENOSCOPY (EGD) WITH PROPOFOL N/A 01/30/2013   Procedure: ESOPHAGOGASTRODUODENOSCOPY (EGD) WITH PROPOFOL;  Surgeon: Irene Shipper, MD;  Location: [facility];  Service: Endoscopy;  Laterality: N/A;   LEFT HEART CATH AND CORONARY ANGIOGRAPHY N/A 10/18/2017   Procedure: LEFT HEART CATH AND CORONARY ANGIOGRAPHY;  Surgeon: Martinique, Peter M, MD;  Location: [facility];  Service: Cardiovascular;  Laterality: N/A;   LOOP RECORDER INSERTION N/A 03/18/2017   Procedure: Loop Recorder Insertion;  Surgeon: Constance Haw, MD;  Location: [facility];  Service: Cardiovascular;  Laterality: N/A;   PARTIAL HYSTERECTOMY     TEE WITHOUT CARDIOVERSION N/A 03/16/2017   Procedure: TRANSESOPHAGEAL ECHOCARDIOGRAM (TEE);  Surgeon: Acie Fredrickson Wonda Cheng, MD;  Location: [facility];  Service: Cardiovascular;  Laterality: N/A;   TOTAL KNEE ARTHROPLASTY Right 04/20/2014   Procedure: RIGHT TOTAL  KNEE ARTHROPLASTY CONVERTED TO RIGHT KNEE REIMPLANTATION;  Surgeon: Mcarthur Rossetti, MD;  Location: [facility];  Service: Orthopedics;  Laterality: Right;   TOTAL KNEE ARTHROPLASTY Left 08/23/2015   Procedure: LEFT TOTAL KNEE ARTHROPLASTY;  Surgeon: Mcarthur Rossetti, MD;  Location: [facility];  Service: Orthopedics;  Laterality: Left;   VENTRAL HERNIA REPAIR      x2    Social History Rachel Black  reports that she has never smoked. She has never used smokeless tobacco. She reports that she does not drink alcohol and does not use drugs.  family history includes Breast cancer in her mother; Crohn's disease in her son; Diabetes in her father and mother; Heart disease in her father; Hyperlipidemia in her father; Hypertension in her mother; Irritable bowel syndrome in her son; Stroke in her father.  Allergies  Allergen Reactions   Other Other (See Comments)    SOME BANDAIDS CAUSE SKIN IRRITATION   Penicillins Itching    Has patient had a PCN reaction causing immediate rash, facial/tongue/throat swelling, SOB or lightheadedness with hypotension: No Has patient had a PCN reaction causing severe rash involving mucus membranes or skin necrosis: No Has patient had a PCN reaction that required hospitalization No Has patient had a PCN reaction occurring within the last 10 years: No If all of the above answers are "NO", then may proceed with Cephalosporin use.        PHYSICAL EXAMINATION: Vital signs: BP 120/70   Pulse 67   Ht 5' 6"  (1.676 m)   Wt 214 lb (97.1 kg)   BMI 34.54 kg/m   Constitutional: Chronically ill-appearing but comfortable, no acute distress.  In a wheelchair Psychiatric: alert and oriented x3, cooperative Eyes: extraocular movements intact, anicteric, conjunctiva pink Mouth: oral pharynx moist, no lesions Neck: supple no lymphadenopathy Cardiovascular: heart regular rate and rhythm, no murmur Lungs: clear to auscultation bilaterally Abdomen: soft, nontender, nondistended, no obvious ascites, no peritoneal signs, normal bowel sounds, no organomegaly Rectal: Omitted Extremities: no clubbing, cyanosis, or lower extremity edema bilaterally Skin: no lesions on visible extremities Neuro: No focal deficits. No asterixis.     ASSESSMENT:  1.  [place] abdominal cramping with urgency and loose stools.  May be medication  reaction.  May be functional. 2.  History of diminutive colon polyp. 3.  History of left-sided diverticulosis 4.  Status postcholecystectomy 5.  Status post ERCP with common duct stone extraction post sphincterotomy 6.  History of bleeding hyperplastic gastric polyp removed endoscopically 7.  GERD.  Under control with medical therapy  PLAN:  1.  Citrucel 2 tablespoons daily to improve bowel consistency 2.  Prescribe Levsin sublingual 0.25 mg.  Take 1 or 2 sublingually every 4 hours as needed for cramping pain 3.  Resume general medical care with PCP 4.  GI follow-up as needed A total time of 30 minutes was spent preparing to see the patient, reviewing tests, x-rays, endoscopy reports, and pathology.  Obtaining comprehensive history, performing medically appropriate physical examination.  Counseling the patient and her husband regarding her above listed issues.  Ordering medications.  And, documenting clinical information in the health record

## 2021-05-09 NOTE — Patient Instructions (Signed)
If you are age 69 or older, your body mass index should be between 23-30. Your Body mass index is 34.54 kg/m. If this is out of the aforementioned range listed, please consider follow up with your Primary Care Provider.  If you are age 69 or younger, your body mass index should be between 19-25. Your Body mass index is 34.54 kg/m. If this is out of the aformentioned range listed, please consider follow up with your Primary Care Provider.   __________________________________________________________  The [facility] providers would like to encourage you to use [facility] to communicate with providers for non-urgent requests or questions.  Due to long hold times on the telephone, sending your provider a message by [facility] may be a faster and more efficient way to get a response.  Please allow 48 business hours for a response.  Please remember that this is for non-urgent requests.   We have sent the following medications to your pharmacy for you to pick up at your convenience:  Levsin  Take 2 tablespoons of Citrucel daily

## 2021-05-12 ENCOUNTER — Other Ambulatory Visit (INDEPENDENT_AMBULATORY_CARE_PROVIDER_SITE_OTHER): Payer: BC Managed Care – PPO

## 2021-05-12 DIAGNOSIS — E611 Iron deficiency: Secondary | ICD-10-CM

## 2021-05-12 LAB — IBC + FERRITIN
Ferritin: 140.4 ng/mL (ref 10.0–291.0)
Iron: 127 ug/dL (ref 42–145)
Saturation Ratios: 27.8 % (ref 20.0–50.0)
TIBC: 456.4 ug/dL — ABNORMAL HIGH (ref 250.0–450.0)
Transferrin: 326 mg/dL (ref 212.0–360.0)

## 2021-05-12 LAB — CBC
HCT: 37.9 % (ref 36.0–46.0)
Hemoglobin: 12.8 g/dL (ref 12.0–15.0)
MCHC: 33.7 g/dL (ref 30.0–36.0)
MCV: 92.3 fl (ref 78.0–100.0)
Platelets: 178 10*3/uL (ref 150.0–400.0)
RBC: 4.11 Mil/uL (ref 3.87–5.11)
RDW: 14.7 % (ref 11.5–15.5)
WBC: 8.5 10*3/uL (ref 4.0–10.5)

## 2021-05-13 ENCOUNTER — Other Ambulatory Visit: Payer: Self-pay | Admitting: Physician Assistant

## 2021-05-13 DIAGNOSIS — E559 Vitamin D deficiency, unspecified: Secondary | ICD-10-CM

## 2021-05-23 ENCOUNTER — Other Ambulatory Visit: Payer: Self-pay | Admitting: Physician Assistant

## 2021-05-23 DIAGNOSIS — E1159 Type 2 diabetes mellitus with other circulatory complications: Secondary | ICD-10-CM

## 2021-05-23 DIAGNOSIS — I152 Hypertension secondary to endocrine disorders: Secondary | ICD-10-CM

## 2021-05-26 ENCOUNTER — Other Ambulatory Visit: Payer: Self-pay | Admitting: Gastroenterology

## 2021-05-26 DIAGNOSIS — K317 Polyp of stomach and duodenum: Secondary | ICD-10-CM

## 2021-05-29 ENCOUNTER — Encounter: Payer: Self-pay | Admitting: Physician Assistant

## 2021-06-01 ENCOUNTER — Other Ambulatory Visit: Payer: Self-pay | Admitting: Physician Assistant

## 2021-06-01 DIAGNOSIS — E559 Vitamin D deficiency, unspecified: Secondary | ICD-10-CM

## 2021-06-12 ENCOUNTER — Other Ambulatory Visit: Payer: Self-pay | Admitting: Physician Assistant

## 2021-06-12 DIAGNOSIS — E785 Hyperlipidemia, unspecified: Secondary | ICD-10-CM

## 2021-06-17 ENCOUNTER — Other Ambulatory Visit: Payer: Self-pay | Admitting: Cardiology

## 2021-07-22 ENCOUNTER — Other Ambulatory Visit: Payer: Self-pay

## 2021-07-22 ENCOUNTER — Ambulatory Visit: Payer: BC Managed Care – PPO | Admitting: Podiatry

## 2021-07-22 ENCOUNTER — Encounter: Payer: Self-pay | Admitting: Podiatry

## 2021-07-22 DIAGNOSIS — M79674 Pain in right toe(s): Secondary | ICD-10-CM

## 2021-07-22 DIAGNOSIS — Q828 Other specified congenital malformations of skin: Secondary | ICD-10-CM

## 2021-07-22 DIAGNOSIS — B351 Tinea unguium: Secondary | ICD-10-CM

## 2021-07-22 DIAGNOSIS — L84 Corns and callosities: Secondary | ICD-10-CM

## 2021-07-22 DIAGNOSIS — E1169 Type 2 diabetes mellitus with other specified complication: Secondary | ICD-10-CM

## 2021-07-22 DIAGNOSIS — M79675 Pain in left toe(s): Secondary | ICD-10-CM | POA: Diagnosis not present

## 2021-07-27 NOTE — Progress Notes (Signed)
  Subjective:  Patient ID: Rachel Black, female    DOB: Nov 01, 1950,  MRN: [id number]  Rachel Black presents to clinic today for at risk foot care. Patient has history of preulcerative callus left foot and painful porokeratotic lesion(s) plantar aspect right foot and painful mycotic toenails that limit ambulation. Painful toenails interfere with ambulation. Aggravating factors include wearing enclosed shoe gear. Pain is relieved with periodic professional debridement. Painful porokeratotic lesions are aggravated when weightbearing with and without shoegear. Pain is relieved with periodic professional debridement.  She is accompanied by her husband on today's visit.   Patient states she has received her diabetic shoes, but doesn't really wear them in the home.  PCP is Lorrene Reid, PA-C , and last visit was 04/28/2021.  Allergies  Allergen Reactions   Other Other (See Comments)    SOME BANDAIDS CAUSE SKIN IRRITATION   Penicillins Itching    Has patient had a PCN reaction causing immediate rash, facial/tongue/throat swelling, SOB or lightheadedness with hypotension: No Has patient had a PCN reaction causing severe rash involving mucus membranes or skin necrosis: No Has patient had a PCN reaction that required hospitalization No Has patient had a PCN reaction occurring within the last 10 years: No If all of the above answers are "NO", then may proceed with Cephalosporin use.     Review of Systems: Negative except as noted in the HPI. Objective:   Constitutional MODENA BELLEMARE is a pleasant 70 y.o. Caucasian female, in NAD. AAO x 3.   Vascular CFT immediate b/l LE. Palpable DP/PT pulses b/l LE. Digital hair sparse b/l. Skin temperature gradient WNL b/l. No pain with calf compression b/l. No edema noted b/l. Lower extremity skin temperature gradient within normal limits. No cyanosis or clubbing noted.  Neurologic Normal speech. Oriented to person, place, and time. Protective sensation  intact 5/5 intact bilaterally with 10g monofilament b/l. Vibratory sensation intact b/l.  Dermatologic Pedal skin warm and supple b/l.  No open wounds b/l. No interdigital macerations. Toenails 1-5 b/l elongated, thickened, discolored with subungual debris. +Tenderness with dorsal palpation of nailplates.  Preulcerative lesion(s) noted submet head 1 left foot. Porokeratotic lesion(s) noted submet head 1 right foot.  Orthopedic: Normal muscle strength 5/5 to all lower extremity muscle groups bilaterally. HAV with bunion deformity noted b/l LE. Pes planovalgus deformity noted b/l lower extremities.   Radiographs: None Assessment:   1. Pain due to onychomycosis of toenails of both feet   2. Pre-ulcerative calluses   3. Porokeratosis   4. Type 2 diabetes mellitus with other specified complication, without long-term current use of insulin ([place])    Plan:  Patient was evaluated and treated and all questions answered. Consent given for treatment as described below: -Examined patient. -Encouraged patient to at least wear her offloaded insoles inside of shoes when at home to prevent skin breakdown on plantar lesions. -Continue diabetic foot care principles: inspect feet daily, monitor glucose as recommended by PCP and/or Endocrinologist, and follow prescribed diet per PCP, Endocrinologist and/or dietician. -Toenails 1-5 b/l were debrided in length and girth with sterile nail nippers and dremel without iatrogenic bleeding.  -Preulcerative lesion pared submet head 1 left foot. Total number pared=1. -Painful porokeratotic lesion(s) submet head 1 right foot pared and enucleated with sterile scalpel blade without incident. Total number of lesions debrided=1. -Patient/POA to call should there be question/concern in the interim.  Return in about 3 months (around 10/22/2021).  Marzetta Board, DPM

## 2021-07-29 ENCOUNTER — Other Ambulatory Visit: Payer: Self-pay | Admitting: Physician Assistant

## 2021-07-29 DIAGNOSIS — E1159 Type 2 diabetes mellitus with other circulatory complications: Secondary | ICD-10-CM

## 2021-07-31 ENCOUNTER — Other Ambulatory Visit: Payer: Self-pay | Admitting: Physician Assistant

## 2021-07-31 DIAGNOSIS — M1000 Idiopathic gout, unspecified site: Secondary | ICD-10-CM

## 2021-07-31 DIAGNOSIS — E559 Vitamin D deficiency, unspecified: Secondary | ICD-10-CM

## 2021-08-20 ENCOUNTER — Other Ambulatory Visit: Payer: Self-pay | Admitting: Gastroenterology

## 2021-08-20 DIAGNOSIS — K317 Polyp of stomach and duodenum: Secondary | ICD-10-CM

## 2021-08-26 ENCOUNTER — Ambulatory Visit (INDEPENDENT_AMBULATORY_CARE_PROVIDER_SITE_OTHER): Payer: BC Managed Care – PPO | Admitting: Physician Assistant

## 2021-08-26 ENCOUNTER — Other Ambulatory Visit: Payer: Self-pay

## 2021-08-26 ENCOUNTER — Encounter: Payer: Self-pay | Admitting: Physician Assistant

## 2021-08-26 VITALS — BP 124/72 | HR 66 | Temp 98.1°F | Ht 66.0 in | Wt 196.6 lb

## 2021-08-26 DIAGNOSIS — E785 Hyperlipidemia, unspecified: Secondary | ICD-10-CM

## 2021-08-26 DIAGNOSIS — E1169 Type 2 diabetes mellitus with other specified complication: Secondary | ICD-10-CM

## 2021-08-26 DIAGNOSIS — Z0000 Encounter for general adult medical examination without abnormal findings: Secondary | ICD-10-CM | POA: Diagnosis not present

## 2021-08-26 DIAGNOSIS — M1000 Idiopathic gout, unspecified site: Secondary | ICD-10-CM

## 2021-08-26 DIAGNOSIS — E1159 Type 2 diabetes mellitus with other circulatory complications: Secondary | ICD-10-CM | POA: Diagnosis not present

## 2021-08-26 DIAGNOSIS — E559 Vitamin D deficiency, unspecified: Secondary | ICD-10-CM

## 2021-08-26 DIAGNOSIS — E782 Mixed hyperlipidemia: Secondary | ICD-10-CM

## 2021-08-26 DIAGNOSIS — Z13820 Encounter for screening for osteoporosis: Secondary | ICD-10-CM

## 2021-08-26 DIAGNOSIS — Z23 Encounter for immunization: Secondary | ICD-10-CM | POA: Diagnosis not present

## 2021-08-26 DIAGNOSIS — I152 Hypertension secondary to endocrine disorders: Secondary | ICD-10-CM

## 2021-08-26 DIAGNOSIS — Z1211 Encounter for screening for malignant neoplasm of colon: Secondary | ICD-10-CM

## 2021-08-26 DIAGNOSIS — Z1231 Encounter for screening mammogram for malignant neoplasm of breast: Secondary | ICD-10-CM

## 2021-08-26 LAB — POCT GLYCOSYLATED HEMOGLOBIN (HGB A1C): Hemoglobin A1C: 5.2 % (ref 4.0–5.6)

## 2021-08-26 MED ORDER — OLMESARTAN MEDOXOMIL 5 MG PO TABS: 5.0000 mg | ORAL_TABLET | Freq: Every day | ORAL | 0 refills | Status: DC

## 2021-08-26 MED ORDER — FENOFIBRATE 160 MG PO TABS: 160.0000 mg | ORAL_TABLET | Freq: Every day | ORAL | 1 refills | Status: DC

## 2021-08-26 MED ORDER — VITAMIN D (ERGOCALCIFEROL) 1.25 MG (50000 UNIT) PO CAPS: ORAL_CAPSULE | ORAL | 0 refills | Status: DC

## 2021-08-26 MED ORDER — ALLOPURINOL 300 MG PO TABS: 300.0000 mg | ORAL_TABLET | Freq: Two times a day (BID) | ORAL | 1 refills | Status: DC

## 2021-08-26 NOTE — Progress Notes (Signed)
Subjective:     Rachel Black is a 70 y.o. female and is here for a comprehensive physical exam. The patient reports no problems.  Social History   Socioeconomic History   Marital status: Married    Spouse name: [name]   Number of children: 2   Years of education: Not on file   Highest education level: Not on file  Occupational History   Occupation: housewife    Employer: UNEMPLOYED  Tobacco Use   Smoking status: Never   Smokeless tobacco: Never  Substance and Sexual Activity   Alcohol use: No   Drug use: No   Sexual activity: Yes    Partners: Male  Other Topics Concern   Not on file  Social History Narrative   No exercise secondary to knee pain   Social Determinants of Health   Financial Resource Strain: Not on file  Food Insecurity: Not on file  Transportation Needs: Not on file  Physical Activity: Not on file  Stress: Not on file  Social Connections: Not on file  Intimate Partner Violence: Not on file   Health Maintenance  Topic Date Due   COVID-19 Vaccine (1) Never done   COLONOSCOPY (Pts 45-60yrs Insurance coverage will need to be confirmed)  01/30/2014   Pneumonia Vaccine 77+ Years old (3 - PPSV23 if available, else PCV20) 09/21/2018   MAMMOGRAM  01/05/2019   INFLUENZA VACCINE  04/14/2021   FOOT EXAM  08/22/2021   HEMOGLOBIN A1C  10/29/2021   OPHTHALMOLOGY EXAM  11/21/2021   TETANUS/TDAP  04/10/2024   DEXA SCAN  Completed   Hepatitis C Screening  Completed   Zoster Vaccines- Shingrix  Completed   HPV VACCINES  Aged Out    The following portions of the patient's history were reviewed and updated as appropriate: allergies, current medications, past family history, past medical history, past social history, past surgical history, and problem list.  Review of Systems Pertinent items noted in HPI and remainder of comprehensive ROS otherwise negative.   Objective:    BP (!) 154/66    Pulse 66    Temp 98.1 F (36.7 C)    Ht 5\' 6"  (1.676 m)    Wt 196 lb  9.6 oz (89.2 kg)    SpO2 100%    BMI 31.73 kg/m  General appearance: alert, cooperative, and no distress Head: Normocephalic, without obvious abnormality, atraumatic Eyes: conjunctivae/corneas clear. PERRL, EOM's intact. Fundi benign. Ears: normal TM's and external ear canals both ears Nose: Nares normal. Septum midline. Mucosa normal. No drainage or sinus tenderness. Throat: normal findings: lips normal without lesions and buccal mucosa normal and abnormal findings: fissured tongue Neck: no adenopathy, no JVD, supple, symmetrical, trachea midline, and thyroid: normal to inspection and palpation Back: symmetric, no curvature. ROM normal. No CVA tenderness. Lungs: clear to auscultation bilaterally Breasts:  pt deferred Heart: regular rate and rhythm and S1, S2 normal Abdomen: normal findings: bowel sounds normal and umbilicus normal and abnormal findings:  mild tenderness in the lower abdomen and in the upper abdomen Pelvic: not indicated; status post hysterectomy, negative ROS Extremities: extremities normal, atraumatic, no cyanosis or edema Pulses: 2+ and symmetric Skin: Skin color, texture, turgor normal. No rashes or lesions Lymph nodes: Cervical adenopathy: normal and Supraclavicular adenopathy: normal Neurologic: Grossly normal    Assessment:    Healthy female exam.     Plan:  -Will obtain fasting labs. A1c well controlled, advised to monitor for hypoglycemia events <80 for medication adjustments. -Pt agreeable to mammogram, influenza  vaccine and screening colonoscopy. -Discussed heart healthy diet. -Recommend to stay well hydrated. -Continue current medication regimen. -Continue to follow up with gastroenterology as advised. -Follow up in 4 months for DM, HTN, HLD  See After Visit Summary for Counseling Recommendations

## 2021-08-26 NOTE — Patient Instructions (Signed)
Preventive Care 65 Years and Older, Female °Preventive care refers to lifestyle choices and visits with your health care provider that can promote health and wellness. Preventive care visits are also called wellness exams. °What can I expect for my preventive care visit? °Counseling °Your health care provider may ask you questions about your: °Medical history, including: °Past medical problems. °Family medical history. °Pregnancy and menstrual history. °History of falls. °Current health, including: °Memory and ability to understand (cognition). °Emotional well-being. °Home life and relationship well-being. °Sexual activity and sexual health. °Lifestyle, including: °Alcohol, nicotine or tobacco, and drug use. °Access to firearms. °Diet, exercise, and sleep habits. °Work and work environment. °Sunscreen use. °Safety issues such as seatbelt and bike helmet use. °Physical exam °Your health care provider will check your: °Height and weight. These may be used to calculate your BMI (body mass index). BMI is a measurement that tells if you are at a healthy weight. °Waist circumference. This measures the distance around your waistline. This measurement also tells if you are at a healthy weight and may help predict your risk of certain diseases, such as type 2 diabetes and high blood pressure. °Heart rate and blood pressure. °Body temperature. °Skin for abnormal spots. °What immunizations do I need? °Vaccines are usually given at various ages, according to a schedule. Your health care provider will recommend vaccines for you based on your age, medical history, and lifestyle or other factors, such as travel or where you work. °What tests do I need? °Screening °Your health care provider may recommend screening tests for certain conditions. This may include: °Lipid and cholesterol levels. °Hepatitis C test. °Hepatitis B test. °HIV (human immunodeficiency virus) test. °STI (sexually transmitted infection) testing, if you are at  risk. °Lung cancer screening. °Colorectal cancer screening. °Diabetes screening. This is done by checking your blood sugar (glucose) after you have not eaten for a while (fasting). °Mammogram. Talk with your health care provider about how often you should have regular mammograms. °BRCA-related cancer screening. This may be done if you have a family history of breast, ovarian, tubal, or peritoneal cancers. °Bone density scan. This is done to screen for osteoporosis. °Talk with your health care provider about your test results, treatment options, and if necessary, the need for more tests. °Follow these instructions at home: °Eating and drinking ° °Eat a diet that includes fresh fruits and vegetables, whole grains, lean protein, and low-fat dairy products. Limit your intake of foods with high amounts of sugar, saturated fats, and salt. °Take vitamin and mineral supplements as recommended by your health care provider. °Do not drink alcohol if your health care provider tells you not to drink. °If you drink alcohol: °Limit how much you have to 0-1 drink a day. °Know how much alcohol is in your drink. In the [place] one drink equals one 12 oz bottle of beer (355 mL), one 5 oz glass of wine (148 mL), or one 1½ oz glass of hard liquor (44 mL). °Lifestyle °Brush your teeth every morning and night with fluoride toothpaste. Floss one time each day. °Exercise for at least 30 minutes 5 or more days each week. °Do not use any products that contain nicotine or tobacco. These products include cigarettes, chewing tobacco, and vaping devices, such as e-cigarettes. If you need help quitting, ask your health care provider. °Do not use drugs. °If you are sexually active, practice safe sex. Use a condom or other form of protection in order to prevent STIs. °Take aspirin only as told by your   health care provider. Make sure that you understand how much to take and what form to take. Work with your health care provider to find out whether it  is safe and beneficial for you to take aspirin daily. Ask your health care provider if you need to take a cholesterol-lowering medicine (statin). Find healthy ways to manage stress, such as: Meditation, yoga, or listening to music. Journaling. Talking to a trusted person. Spending time with friends and family. Minimize exposure to UV radiation to reduce your risk of skin cancer. Safety Always wear your seat belt while driving or riding in a vehicle. Do not drive: If you have been drinking alcohol. Do not ride with someone who has been drinking. When you are tired or distracted. While texting. If you have been using any mind-altering substances or drugs. Wear a helmet and other protective equipment during sports activities. If you have firearms in your house, make sure you follow all gun safety procedures. What's next? Visit your health care provider once a year for an annual wellness visit. Ask your health care provider how often you should have your eyes and teeth checked. Stay up to date on all vaccines. This information is not intended to replace advice given to you by your health care provider. Make sure you discuss any questions you have with your health care provider. Document Revised: 02/26/2021 Document Reviewed: 02/26/2021 Elsevier Patient Education  [place].

## 2021-08-27 LAB — COMPREHENSIVE METABOLIC PANEL
ALT: 15 IU/L (ref 0–32)
AST: 26 IU/L (ref 0–40)
Albumin/Globulin Ratio: 2.4 — ABNORMAL HIGH (ref 1.2–2.2)
Albumin: 4.3 g/dL (ref 3.8–4.8)
Alkaline Phosphatase: 52 IU/L (ref 44–121)
BUN/Creatinine Ratio: 12 (ref 12–28)
BUN: 15 mg/dL (ref 8–27)
Bilirubin Total: 0.4 mg/dL (ref 0.0–1.2)
CO2: 22 mmol/L (ref 20–29)
Calcium: 10.1 mg/dL (ref 8.7–10.3)
Chloride: 102 mmol/L (ref 96–106)
Creatinine, Ser: 1.27 mg/dL — ABNORMAL HIGH (ref 0.57–1.00)
Globulin, Total: 1.8 g/dL (ref 1.5–4.5)
Glucose: 99 mg/dL (ref 70–99)
Potassium: 4.4 mmol/L (ref 3.5–5.2)
Sodium: 140 mmol/L (ref 134–144)
Total Protein: 6.1 g/dL (ref 6.0–8.5)
eGFR: 46 mL/min/{1.73_m2} — ABNORMAL LOW (ref >59)

## 2021-08-27 LAB — CBC WITH DIFFERENTIAL/PLATELET
Basophils Absolute: 0 10*3/uL (ref 0.0–0.2)
Basos: 1 %
EOS (ABSOLUTE): 0 10*3/uL (ref 0.0–0.4)
Eos: 0 %
Hematocrit: 35.7 % (ref 34.0–46.6)
Hemoglobin: 12.1 g/dL (ref 11.1–15.9)
Immature Grans (Abs): 0.1 10*3/uL (ref 0.0–0.1)
Immature Granulocytes: 1 %
Lymphocytes Absolute: 1.6 10*3/uL (ref 0.7–3.1)
Lymphs: 27 %
MCH: 30.7 pg (ref 26.6–33.0)
MCHC: 33.9 g/dL (ref 31.5–35.7)
MCV: 91 fL (ref 79–97)
Monocytes Absolute: 0.2 10*3/uL (ref 0.1–0.9)
Monocytes: 4 %
Neutrophils Absolute: 4 10*3/uL (ref 1.4–7.0)
Neutrophils: 67 %
Platelets: 178 10*3/uL (ref 150–450)
RBC: 3.94 x10E6/uL (ref 3.77–5.28)
RDW: 13.1 % (ref 11.7–15.4)
WBC: 5.9 10*3/uL (ref 3.4–10.8)

## 2021-08-27 LAB — LIPID PANEL
Chol/HDL Ratio: 3.8 ratio (ref 0.0–4.4)
Cholesterol, Total: 119 mg/dL (ref 100–199)
HDL: 31 mg/dL — ABNORMAL LOW (ref >39)
LDL Chol Calc (NIH): 53 mg/dL (ref 0–99)
Triglycerides: 213 mg/dL — ABNORMAL HIGH (ref 0–149)
VLDL Cholesterol Cal: 35 mg/dL (ref 5–40)

## 2021-08-27 LAB — TSH: TSH: 2.04 u[IU]/mL (ref 0.450–4.500)

## 2021-08-28 ENCOUNTER — Encounter: Payer: Self-pay | Admitting: Internal Medicine

## 2021-09-04 ENCOUNTER — Telehealth: Payer: Self-pay | Admitting: Internal Medicine

## 2021-09-04 NOTE — Telephone Encounter (Signed)
Patient called and stated that she is having abd pain and some diarrhea. Seeking advice, please advise.

## 2021-09-05 ENCOUNTER — Other Ambulatory Visit: Payer: Self-pay | Admitting: Internal Medicine

## 2021-09-05 NOTE — Telephone Encounter (Signed)
Spoke with patient and she said that symptoms have resolved. Pt stated that she did not need assistance right now. Told pt to call us back if she had any other concerns. Pt verbalized understanding.

## 2021-09-15 ENCOUNTER — Encounter (HOSPITAL_COMMUNITY): Payer: Self-pay

## 2021-09-15 ENCOUNTER — Inpatient Hospital Stay (HOSPITAL_COMMUNITY)
Admission: EM | Admit: 2021-09-15 | Discharge: 2021-09-19 | DRG: 193 | Disposition: A | Discharge status: Home / Self Care | Payer: BC Managed Care – PPO | Attending: Family Medicine | Admitting: Family Medicine

## 2021-09-15 ENCOUNTER — Emergency Department (HOSPITAL_COMMUNITY): Payer: BC Managed Care – PPO

## 2021-09-15 DIAGNOSIS — Y92013 Bedroom of single-family (private) house as the place of occurrence of the external cause: Secondary | ICD-10-CM

## 2021-09-15 DIAGNOSIS — E1159 Type 2 diabetes mellitus with other circulatory complications: Secondary | ICD-10-CM | POA: Diagnosis present

## 2021-09-15 DIAGNOSIS — N390 Urinary tract infection, site not specified: Secondary | ICD-10-CM

## 2021-09-15 DIAGNOSIS — Z833 Family history of diabetes mellitus: Secondary | ICD-10-CM

## 2021-09-15 DIAGNOSIS — Z83438 Family history of other disorder of lipoprotein metabolism and other lipidemia: Secondary | ICD-10-CM

## 2021-09-15 DIAGNOSIS — Z7984 Long term (current) use of oral hypoglycemic drugs: Secondary | ICD-10-CM

## 2021-09-15 DIAGNOSIS — S92301A Fracture of unspecified metatarsal bone(s), right foot, initial encounter for closed fracture: Secondary | ICD-10-CM | POA: Diagnosis present

## 2021-09-15 DIAGNOSIS — Z823 Family history of stroke: Secondary | ICD-10-CM

## 2021-09-15 DIAGNOSIS — M109 Gout, unspecified: Secondary | ICD-10-CM | POA: Diagnosis present

## 2021-09-15 DIAGNOSIS — W010XXA Fall on same level from slipping, tripping and stumbling without subsequent striking against object, initial encounter: Secondary | ICD-10-CM | POA: Diagnosis present

## 2021-09-15 DIAGNOSIS — J101 Influenza due to other identified influenza virus with other respiratory manifestations: Secondary | ICD-10-CM | POA: Diagnosis not present

## 2021-09-15 DIAGNOSIS — N3000 Acute cystitis without hematuria: Secondary | ICD-10-CM | POA: Diagnosis present

## 2021-09-15 DIAGNOSIS — Z8673 Personal history of transient ischemic attack (TIA), and cerebral infarction without residual deficits: Secondary | ICD-10-CM

## 2021-09-15 DIAGNOSIS — I2510 Atherosclerotic heart disease of native coronary artery without angina pectoris: Secondary | ICD-10-CM | POA: Diagnosis present

## 2021-09-15 DIAGNOSIS — I10 Essential (primary) hypertension: Secondary | ICD-10-CM | POA: Diagnosis present

## 2021-09-15 DIAGNOSIS — Z880 Allergy status to penicillin: Secondary | ICD-10-CM

## 2021-09-15 DIAGNOSIS — S92341A Displaced fracture of fourth metatarsal bone, right foot, initial encounter for closed fracture: Secondary | ICD-10-CM | POA: Diagnosis present

## 2021-09-15 DIAGNOSIS — E782 Mixed hyperlipidemia: Secondary | ICD-10-CM | POA: Diagnosis present

## 2021-09-15 DIAGNOSIS — Z789 Other specified health status: Secondary | ICD-10-CM

## 2021-09-15 DIAGNOSIS — E119 Type 2 diabetes mellitus without complications: Secondary | ICD-10-CM | POA: Diagnosis present

## 2021-09-15 DIAGNOSIS — E1169 Type 2 diabetes mellitus with other specified complication: Secondary | ICD-10-CM | POA: Diagnosis present

## 2021-09-15 DIAGNOSIS — K219 Gastro-esophageal reflux disease without esophagitis: Secondary | ICD-10-CM | POA: Diagnosis present

## 2021-09-15 DIAGNOSIS — Z96653 Presence of artificial knee joint, bilateral: Secondary | ICD-10-CM | POA: Diagnosis present

## 2021-09-15 DIAGNOSIS — Z803 Family history of malignant neoplasm of breast: Secondary | ICD-10-CM

## 2021-09-15 DIAGNOSIS — Z79899 Other long term (current) drug therapy: Secondary | ICD-10-CM

## 2021-09-15 DIAGNOSIS — N179 Acute kidney failure, unspecified: Secondary | ICD-10-CM | POA: Diagnosis present

## 2021-09-15 DIAGNOSIS — Z8249 Family history of ischemic heart disease and other diseases of the circulatory system: Secondary | ICD-10-CM

## 2021-09-15 DIAGNOSIS — I152 Hypertension secondary to endocrine disorders: Secondary | ICD-10-CM | POA: Diagnosis present

## 2021-09-15 DIAGNOSIS — E860 Dehydration: Secondary | ICD-10-CM | POA: Diagnosis present

## 2021-09-15 DIAGNOSIS — S92901A Unspecified fracture of right foot, initial encounter for closed fracture: Secondary | ICD-10-CM

## 2021-09-15 DIAGNOSIS — Z7982 Long term (current) use of aspirin: Secondary | ICD-10-CM

## 2021-09-15 DIAGNOSIS — G9341 Metabolic encephalopathy: Secondary | ICD-10-CM | POA: Diagnosis not present

## 2021-09-15 DIAGNOSIS — Z20822 Contact with and (suspected) exposure to covid-19: Secondary | ICD-10-CM | POA: Diagnosis present

## 2021-09-15 DIAGNOSIS — I252 Old myocardial infarction: Secondary | ICD-10-CM

## 2021-09-15 HISTORY — DX: Unspecified fracture of right foot, initial encounter for closed fracture: S92.901A

## 2021-09-15 LAB — BASIC METABOLIC PANEL
Anion gap: 10 (ref 5–15)
BUN: 22 mg/dL (ref 8–23)
CO2: 22 mmol/L (ref 22–32)
Calcium: 10 mg/dL (ref 8.9–10.3)
Chloride: 102 mmol/L (ref 98–111)
Creatinine, Ser: 1.48 mg/dL — ABNORMAL HIGH (ref 0.44–1.00)
GFR, Estimated: 38 mL/min — ABNORMAL LOW (ref >60)
Glucose, Bld: 105 mg/dL — ABNORMAL HIGH (ref 70–99)
Potassium: 4 mmol/L (ref 3.5–5.1)
Sodium: 134 mmol/L — ABNORMAL LOW (ref 135–145)

## 2021-09-15 LAB — CBC
HCT: 36.4 % (ref 36.0–46.0)
Hemoglobin: 12.2 g/dL (ref 12.0–15.0)
MCH: 31.5 pg (ref 26.0–34.0)
MCHC: 33.5 g/dL (ref 30.0–36.0)
MCV: 94.1 fL (ref 80.0–100.0)
Platelets: 150 10*3/uL (ref 150–400)
RBC: 3.87 MIL/uL (ref 3.87–5.11)
RDW: 14.1 % (ref 11.5–15.5)
WBC: 5.8 10*3/uL (ref 4.0–10.5)
nRBC: 0 % (ref 0.0–0.2)

## 2021-09-15 LAB — HEPATIC FUNCTION PANEL
ALT: 26 U/L (ref 0–44)
AST: 52 U/L — ABNORMAL HIGH (ref 15–41)
Albumin: 4.3 g/dL (ref 3.5–5.0)
Alkaline Phosphatase: 43 U/L (ref 38–126)
Bilirubin, Direct: 0.3 mg/dL — ABNORMAL HIGH (ref 0.0–0.2)
Indirect Bilirubin: 0.4 mg/dL (ref 0.3–0.9)
Total Bilirubin: 0.7 mg/dL (ref 0.3–1.2)
Total Protein: 7.2 g/dL (ref 6.5–8.1)

## 2021-09-15 LAB — RESP PANEL BY RT-PCR (FLU A&B, COVID) ARPGX2
Influenza A by PCR: POSITIVE — AB
Influenza B by PCR: NEGATIVE
SARS Coronavirus 2 by RT PCR: NEGATIVE

## 2021-09-15 LAB — URINALYSIS, ROUTINE W REFLEX MICROSCOPIC
Bilirubin Urine: NEGATIVE
Glucose, UA: NEGATIVE mg/dL
Hgb urine dipstick: NEGATIVE
Ketones, ur: NEGATIVE mg/dL
Nitrite: POSITIVE — AB
Protein, ur: NEGATIVE mg/dL
Specific Gravity, Urine: 1.024 (ref 1.005–1.030)
pH: 5 (ref 5.0–8.0)

## 2021-09-15 LAB — CBG MONITORING, ED
Glucose-Capillary: 107 mg/dL — ABNORMAL HIGH (ref 70–99)
Glucose-Capillary: 140 mg/dL — ABNORMAL HIGH (ref 70–99)

## 2021-09-15 MED ORDER — SODIUM CHLORIDE 0.9 % IV SOLN: INTRAVENOUS | Status: DC

## 2021-09-15 MED ORDER — OSELTAMIVIR PHOSPHATE 75 MG PO CAPS
75.0000 mg | ORAL_CAPSULE | Freq: Once | ORAL | Status: AC
  Administered 2021-09-15: 75 mg via ORAL
  Filled 2021-09-15: qty 1

## 2021-09-15 MED ORDER — CEPHALEXIN 500 MG PO CAPS
500.0000 mg | ORAL_CAPSULE | Freq: Once | ORAL | Status: AC
Start: 2021-09-15 — End: 2021-09-15
  Administered 2021-09-15: 500 mg via ORAL
  Filled 2021-09-15: qty 1

## 2021-09-15 NOTE — ED Provider Notes (Signed)
[facility] DEPT Provider Note   CSN: [id number] Arrival date & time: 09/15/21  0737     History  Chief Complaint  Patient presents with   Weakness    Rachel Black is a 71 y.o. female.  HPI Patient here for evaluation of pain in right foot, which occurred this morning by report of EMS.  Patient also complains of weakness and coughing for several days.  Level 5 caveat-poor historian    Home Medications Prior to Admission medications   Medication Sig Start Date End Date Taking? Authorizing Provider  ACCU-CHEK SOFTCLIX LANCETS lancets Use as instructed once a day.  DX Code: E11.9 09/21/17   Carollee Herter, Alferd Apa, DO  allopurinol (ZYLOPRIM) 300 MG tablet Take 1 tablet (300 mg total) by mouth 2 (two) times daily. 08/26/21   Lorrene Reid, PA-C  aspirin 81 MG EC tablet Take 1 tablet (81 mg total) by mouth daily. 10/18/17   Cheryln Manly, NP  Calcium Carb-Cholecalciferol (CALCIUM 600-D PO) Take 1 tablet by mouth 2 (two) times daily before a meal.    [provider]  dexlansoprazole (DEXILANT) 60 MG capsule Take 1 capsule (60 mg total) by mouth 2 (two) times daily. 11/26/20   Zehr, Janett Billow D, PA-C  Emollient (UDDERLY SMOOTH) CREA Apply 1 application topically See admin instructions. Apply topically to skin folds as needed for rash/irritation    [provider]  famotidine (PEPCID) 20 MG tablet Take 20 mg by mouth daily.    [provider]  fenofibrate 160 MG tablet Take 1 tablet (160 mg total) by mouth daily. 08/26/21   Lorrene Reid, PA-C  ferrous sulfate 325 (65 FE) MG tablet TAKE 1 TABLET BY MOUTH TWICE A DAY 08/21/21   Zehr, Janett Billow D, PA-C  glucose blood (ACCU-CHEK AVIVA PLUS) test strip Use as instructed once a day.  DX Code E11.9 10/22/20   Lorrene Reid, PA-C  hyoscyamine (LEVSIN SL) 0.125 MG SL tablet PLACE 1-2 TABLETS (0.125-0.25 MG TOTAL) UNDER THE TONGUE EVERY 4 (FOUR) HOURS AS NEEDED. 09/09/21   Irene Shipper, MD   isosorbide mononitrate (IMDUR) 30 MG 24 hr tablet TAKE 1 TABLET BY MOUTH EVERY DAY 06/18/21   Minus Breeding, MD  nitroGLYCERIN (NITROSTAT) 0.4 MG SL tablet Place 1 tablet (0.4 mg total) under the tongue every 5 (five) minutes x 3 doses as needed for chest pain. 08/08/19   Minus Breeding, MD  olmesartan (BENICAR) 5 MG tablet Take 1 tablet (5 mg total) by mouth daily. 08/26/21   Lorrene Reid, PA-C  rosuvastatin (CRESTOR) 40 MG tablet Take 40 mg by mouth daily. 08/04/21   [provider]  SitaGLIPtin-MetFORMIN HCl 50-500 MG TB24 Take 1 tablet by mouth daily. 04/28/21   Lorrene Reid, PA-C  Vitamin D, Ergocalciferol, (DRISDOL) 1.25 MG (50000 UNIT) CAPS capsule TAKE 1 CAPSULE BY MOUTH ONE TIME PER WEEK 08/26/21   Abonza, Maritza, PA-C      Allergies    Other and Penicillins    Review of Systems   Review of Systems  Unable to perform ROS: Mental status change   Physical Exam Updated Vital Signs BP 108/62    Pulse (!) 102    Temp 98.3 F (36.8 C) (Oral)    Resp 16    Ht 5\' 6"  (1.676 m)    Wt 88.9 kg    SpO2 93%    BMI 31.64 kg/m  Physical Exam Vitals and nursing note reviewed.  Constitutional:      General:  She is in acute distress.     Appearance: She is well-developed. She is obese. She is ill-appearing. She is not toxic-appearing or diaphoretic.  HENT:     Head: Normocephalic and atraumatic.     Right Ear: External ear normal.     Left Ear: External ear normal.  Eyes:     Conjunctiva/sclera: Conjunctivae normal.     Pupils: Pupils are equal, round, and reactive to light.  Neck:     Trachea: Phonation normal.  Cardiovascular:     Rate and Rhythm: Regular rhythm. Tachycardia present.     Heart sounds: Normal heart sounds.  Pulmonary:     Effort: Pulmonary effort is normal. No respiratory distress.     Breath sounds: Normal breath sounds. No stridor.  Abdominal:     General: There is no distension.     Palpations: Abdomen is soft.     Tenderness: There is no  abdominal tenderness.  Musculoskeletal:        General: Tenderness (Right foot with moderate swelling and tenderness, primarily in the forefoot.  No gross deformity.) present. Normal range of motion.     Cervical back: Normal range of motion and neck supple.     Comments: Decreased function right foot secondary to pain, otherwise good range of motion of the arms and legs bilaterally.  Skin:    General: Skin is warm and dry.  Neurological:     Mental Status: She is alert.     Cranial Nerves: No cranial nerve deficit.     Sensory: No sensory deficit.     Motor: No abnormal muscle tone.     Coordination: Coordination normal.  Psychiatric:        Mood and Affect: Mood normal.        Behavior: Behavior normal.    ED Results / Procedures / Treatments   Labs (all labs ordered are listed, but only abnormal results are displayed) Labs Reviewed  RESP PANEL BY RT-PCR (FLU A&B, COVID) ARPGX2 - Abnormal; Notable for the following components:      Result Value   Influenza A by PCR POSITIVE (*)    All other components within normal limits  BASIC METABOLIC PANEL - Abnormal; Notable for the following components:   Sodium 134 (*)    Glucose, Bld 105 (*)    Creatinine, Ser 1.48 (*)    GFR, Estimated 38 (*)    All other components within normal limits  URINALYSIS, ROUTINE W REFLEX MICROSCOPIC - Abnormal; Notable for the following components:   Color, Urine AMBER (*)    APPearance HAZY (*)    Nitrite POSITIVE (*)    Leukocytes,Ua LARGE (*)    Bacteria, UA MANY (*)    All other components within normal limits  CBG MONITORING, ED - Abnormal; Notable for the following components:   Glucose-Capillary 107 (*)    All other components within normal limits  CBG MONITORING, ED - Abnormal; Notable for the following components:   Glucose-Capillary 140 (*)    All other components within normal limits  CBC  HEPATIC FUNCTION PANEL    EKG EKG Interpretation  Date/Time:  Monday September 15 2021  07:50:19 EST Ventricular Rate:  107 PR Interval:  154 QRS Duration: 96 QT Interval:  321 QTC Calculation: 429 R Axis:   259 Text Interpretation: Sinus tachycardia Left anterior fascicular block Consider anterior infarct Since last tracing rate faster Otherwise no significant change Confirmed by Daleen Bo [phone]) on 09/15/2021 10:20:25 PM  Radiology DG Foot  Complete Right  Result Date: 09/15/2021 CLINICAL DATA:  Trauma, pain EXAM: RIGHT FOOT COMPLETE - 3+ VIEW COMPARISON:  None. FINDINGS: There is 4 mm calcific density adjacent to the medial aspect of base of first metatarsal. Deformity is seen in the necks of right third and fourth metatarsals. There is break in the cortical margins in the neck of third and fourth metatarsals. Osteopenia is seen in bony structures. Hallux valgus deformity is noted. Plantar spur is seen in calcaneus. Degenerative changes are noted in the right ankle joint. IMPRESSION: Recent comminuted, essentially undisplaced fractures are seen in the the necks of right third and fourth metatarsals. There is 4 mm calcific density adjacent to the base of first metatarsal which may suggest recent or old avulsion. Other chronic findings as described in the body of the report. Electronically Signed   By: Elmer Picker M.D.   On: 09/15/2021 08:39    Procedures .Critical Care Performed by: Daleen Bo, MD Authorized by: Daleen Bo, MD   Critical care provider statement:    Critical care time (minutes):  35   Critical care start time:  09/15/2021 9:30 PM   Critical care end time:  09/15/2021 10:24 PM   Critical care time was exclusive of:  Separately billable procedures and treating other patients   Critical care was necessary to treat or prevent imminent or life-threatening deterioration of the following conditions:  Circulatory failure, trauma and respiratory failure   Critical care was time spent personally by me on the following activities:  Blood draw for specimens,  development of treatment plan with patient or surrogate, discussions with consultants, evaluation of patient's response to treatment, examination of patient, ordering and performing treatments and interventions, ordering and review of laboratory studies, ordering and review of radiographic studies, pulse oximetry, re-evaluation of patient's condition and review of old charts    Medications Ordered in ED Medications  cephALEXin (KEFLEX) capsule 500 mg (has no administration in time range)  oseltamivir (TAMIFLU) capsule 75 mg (has no administration in time range)  0.9 %  sodium chloride infusion (has no administration in time range)    ED Course/ Medical Decision Making/ A&P                           Medical Decision Making Critical Care Total time providing critical care: 30-74 minutes   Patient Vitals for the past 24 hrs:  BP Temp Temp src Pulse Resp SpO2 Height Weight  09/15/21 2137 108/62 -- -- (!) 102 16 93 % 5\' 6"  (1.676 m) 88.9 kg  09/15/21 2134 106/83 98.3 F (36.8 C) Oral 100 20 98 % -- --  09/15/21 2130 108/62 -- -- 100 -- 97 % -- --  09/15/21 1724 111/74 -- -- (!) 116 16 97 % -- --  09/15/21 1344 112/73 -- -- (!) 108 16 99 % -- --  09/15/21 0934 102/75 -- -- (!) 112 16 99 % -- --  09/15/21 0745 114/77 98.7 F (37.1 C) Oral (!) 108 16 98 % -- --    10:27 PM Reevaluation with update and discussion with patient. after initial assessment and treatment, an updated evaluation reveals she is comfortable, but had difficulty walking due to pain and weakness. Illness risk, progressive illness reviewed with husband by telephone, discussed. Daleen Bo    Medical Decision Making: Summary: Patient here for evaluation of pain in right foot, and confusion.  She lives at home with her husband.  She complains of  cough for a couple of days.  Critical Interventions-clinical evaluation, laboratory testing, radiography, observation and reassessment; to evaluate  Chief Complaint  Patient  presents with   Weakness    and assess for illness characterized as Acute, Previously Undiagnosed, Uncertain Prognosis, Complicated, and Systemic Symptoms   After These Interventions, the Patient was reevaluated and was found with new disability, confusion, likely leading to fracture of right foot.  Causes include UTI and influenza A.  She does not have respiratory distress requiring advanced airway or other resuscitative measures.  Symptomatic treatment initiated with IV fluids, oral antibiotic and oral antiviral medication  This patient is Presenting for Evaluation of cough, right foot injury, which does require a range of treatment options, and is a complaint that involves a moderate risk of morbidity and mortality.  The Differential Diagnoses include fracture, contusion, pneumonia, viral illness, metabolic disorder, dehydration.  I obtained additional Historical Information from patient's husband, as the patient is confused. I was able to reach the patient's husband [name], by telephone.  He states that for the last 2 days she has been confused, unaware of what day it is or what time it is.  She has asked him things several times despite having already asked that same question.  He states that previously she did have some on and off memory trouble but this is a change in her status.  Earlier this morning he was helping her walk and she tripped and may have caught her right foot under a cabinet.  He was able to catch her and prevent her from falling.  He states that she was like this previously when she had dehydration and a problem with her liver.  I decided to review pertinent External Data, and in summary she saw her PCP on 08/26/2021 and had a flu immunization at that time.  Other pertinent information, from that visit, includes stable type 2 diabetes,    Clinical Laboratory Tests Ordered, included CBC, Metabolic panel, Urinalysis, and viral panel . Review indicates normal except abnormal urine,  influenza A positive, mild renal insufficiency. Emergent testing abnormality management required for stabilization-treatment of UTI with oral antibiotics and IV fluids for AKI  Radiologic Tests Ordered, included chest x-ray.  I independently Visualized: Radiograph images, which show no acute abnormalities  Cardiac Monitor Tracing which shows sinus tachycardia, indicating likely reactive tachycardia  I ordered the ambulation trial, and orthostatic vital signs medicine Test.  Orthostatics positive for orthostasis with standing.  Ambulation trial, only able to walk 5 steps due to weakness and pain in the right foot.  Pharmaceutical Risk Management medications for influenza and UTI Prescription Management  Social Determinants of Health Risk elderly patient with memory problems and possible early onset dementia  Treatment Complication Risk constellation of illness required Hospitalization for management and stabilization.  Patient at risk if discharged.    10:06 PM-Consult complete with hospitalist. Patient case explained and discussed. Requirement for hospitalization agreed on. Possible Risk of decompensation.  He agrees to admit patient for further evaluation and treatment. Call ended at 11:50 PM  CRITICAL CARE-yes Performed by: Daleen Bo  Nursing Notes Reviewed/ Care Coordinated Applicable Imaging Reviewed Interpretation of Laboratory Data incorporated into ED treatment             Final Clinical Impression(s) / ED Diagnoses Final diagnoses:  Urinary tract infection without hematuria, site unspecified  Closed fracture of right foot, initial encounter  Influenza A  AKI (acute kidney injury) ([place])    Rx / DC Orders ED  Discharge Orders     None         Daleen Bo, MD 09/15/21 2306

## 2021-09-15 NOTE — ED Triage Notes (Signed)
Pt BIB EMS from home. Pt had slip and fall from bed this morning. Pt has pain to top of left foot. Pt reports increasing generalized weakness and cough over the past few days. Pt also endorses lower abdominal pain. Per husband pt has baseline confusion.   HR 100 RR 30 Temp 99.0 BP 130/80 96% RA CBG 108

## 2021-09-16 ENCOUNTER — Other Ambulatory Visit: Payer: Self-pay

## 2021-09-16 ENCOUNTER — Encounter (HOSPITAL_COMMUNITY): Payer: Self-pay | Admitting: Internal Medicine

## 2021-09-16 ENCOUNTER — Observation Stay (HOSPITAL_COMMUNITY): Payer: BC Managed Care – PPO

## 2021-09-16 DIAGNOSIS — S92301A Fracture of unspecified metatarsal bone(s), right foot, initial encounter for closed fracture: Secondary | ICD-10-CM | POA: Diagnosis not present

## 2021-09-16 DIAGNOSIS — Z7984 Long term (current) use of oral hypoglycemic drugs: Secondary | ICD-10-CM | POA: Diagnosis not present

## 2021-09-16 DIAGNOSIS — N3000 Acute cystitis without hematuria: Secondary | ICD-10-CM | POA: Diagnosis present

## 2021-09-16 DIAGNOSIS — I2510 Atherosclerotic heart disease of native coronary artery without angina pectoris: Secondary | ICD-10-CM | POA: Diagnosis present

## 2021-09-16 DIAGNOSIS — K219 Gastro-esophageal reflux disease without esophagitis: Secondary | ICD-10-CM | POA: Diagnosis present

## 2021-09-16 DIAGNOSIS — Z789 Other specified health status: Secondary | ICD-10-CM | POA: Diagnosis not present

## 2021-09-16 DIAGNOSIS — E1169 Type 2 diabetes mellitus with other specified complication: Secondary | ICD-10-CM

## 2021-09-16 DIAGNOSIS — Z833 Family history of diabetes mellitus: Secondary | ICD-10-CM | POA: Diagnosis not present

## 2021-09-16 DIAGNOSIS — I10 Essential (primary) hypertension: Secondary | ICD-10-CM

## 2021-09-16 DIAGNOSIS — G9341 Metabolic encephalopathy: Secondary | ICD-10-CM | POA: Diagnosis present

## 2021-09-16 DIAGNOSIS — Z83438 Family history of other disorder of lipoprotein metabolism and other lipidemia: Secondary | ICD-10-CM | POA: Diagnosis not present

## 2021-09-16 DIAGNOSIS — E119 Type 2 diabetes mellitus without complications: Secondary | ICD-10-CM | POA: Diagnosis present

## 2021-09-16 DIAGNOSIS — E860 Dehydration: Secondary | ICD-10-CM | POA: Diagnosis present

## 2021-09-16 DIAGNOSIS — W010XXA Fall on same level from slipping, tripping and stumbling without subsequent striking against object, initial encounter: Secondary | ICD-10-CM | POA: Diagnosis present

## 2021-09-16 DIAGNOSIS — Z7982 Long term (current) use of aspirin: Secondary | ICD-10-CM | POA: Diagnosis not present

## 2021-09-16 DIAGNOSIS — E782 Mixed hyperlipidemia: Secondary | ICD-10-CM

## 2021-09-16 DIAGNOSIS — J101 Influenza due to other identified influenza virus with other respiratory manifestations: Secondary | ICD-10-CM | POA: Diagnosis present

## 2021-09-16 DIAGNOSIS — Z8249 Family history of ischemic heart disease and other diseases of the circulatory system: Secondary | ICD-10-CM | POA: Diagnosis not present

## 2021-09-16 DIAGNOSIS — I252 Old myocardial infarction: Secondary | ICD-10-CM | POA: Diagnosis not present

## 2021-09-16 DIAGNOSIS — Y92013 Bedroom of single-family (private) house as the place of occurrence of the external cause: Secondary | ICD-10-CM | POA: Diagnosis not present

## 2021-09-16 DIAGNOSIS — Z20822 Contact with and (suspected) exposure to covid-19: Secondary | ICD-10-CM | POA: Diagnosis present

## 2021-09-16 DIAGNOSIS — Z803 Family history of malignant neoplasm of breast: Secondary | ICD-10-CM | POA: Diagnosis not present

## 2021-09-16 DIAGNOSIS — Z8673 Personal history of transient ischemic attack (TIA), and cerebral infarction without residual deficits: Secondary | ICD-10-CM | POA: Diagnosis not present

## 2021-09-16 DIAGNOSIS — N179 Acute kidney failure, unspecified: Secondary | ICD-10-CM | POA: Diagnosis present

## 2021-09-16 DIAGNOSIS — Z823 Family history of stroke: Secondary | ICD-10-CM | POA: Diagnosis not present

## 2021-09-16 DIAGNOSIS — Z880 Allergy status to penicillin: Secondary | ICD-10-CM | POA: Diagnosis not present

## 2021-09-16 DIAGNOSIS — S92341A Displaced fracture of fourth metatarsal bone, right foot, initial encounter for closed fracture: Secondary | ICD-10-CM | POA: Diagnosis present

## 2021-09-16 LAB — CBC WITH DIFFERENTIAL/PLATELET
Abs Immature Granulocytes: 0.03 10*3/uL (ref 0.00–0.07)
Basophils Absolute: 0 10*3/uL (ref 0.0–0.1)
Basophils Relative: 0 %
Eosinophils Absolute: 0 10*3/uL (ref 0.0–0.5)
Eosinophils Relative: 0 %
HCT: 28.8 % — ABNORMAL LOW (ref 36.0–46.0)
Hemoglobin: 9.4 g/dL — ABNORMAL LOW (ref 12.0–15.0)
Immature Granulocytes: 1 %
Lymphocytes Relative: 33 %
Lymphs Abs: 1.5 10*3/uL (ref 0.7–4.0)
MCH: 30.9 pg (ref 26.0–34.0)
MCHC: 32.6 g/dL (ref 30.0–36.0)
MCV: 94.7 fL (ref 80.0–100.0)
Monocytes Absolute: 0.3 10*3/uL (ref 0.1–1.0)
Monocytes Relative: 6 %
Neutro Abs: 2.7 10*3/uL (ref 1.7–7.7)
Neutrophils Relative %: 60 %
Platelets: 103 10*3/uL — ABNORMAL LOW (ref 150–400)
RBC: 3.04 MIL/uL — ABNORMAL LOW (ref 3.87–5.11)
RDW: 14 % (ref 11.5–15.5)
WBC: 4.6 10*3/uL (ref 4.0–10.5)
nRBC: 0 % (ref 0.0–0.2)

## 2021-09-16 LAB — CBG MONITORING, ED
Glucose-Capillary: 98 mg/dL (ref 70–99)
Glucose-Capillary: 92 mg/dL (ref 70–99)
Glucose-Capillary: 95 mg/dL (ref 70–99)

## 2021-09-16 LAB — COMPREHENSIVE METABOLIC PANEL
ALT: 20 U/L (ref 0–44)
AST: 38 U/L (ref 15–41)
Albumin: 3 g/dL — ABNORMAL LOW (ref 3.5–5.0)
Alkaline Phosphatase: 33 U/L — ABNORMAL LOW (ref 38–126)
Anion gap: 10 (ref 5–15)
BUN: 29 mg/dL — ABNORMAL HIGH (ref 8–23)
CO2: 22 mmol/L (ref 22–32)
Calcium: 8.3 mg/dL — ABNORMAL LOW (ref 8.9–10.3)
Chloride: 105 mmol/L (ref 98–111)
Creatinine, Ser: 1.52 mg/dL — ABNORMAL HIGH (ref 0.44–1.00)
GFR, Estimated: 37 mL/min — ABNORMAL LOW (ref >60)
Glucose, Bld: 97 mg/dL (ref 70–99)
Potassium: 3.4 mmol/L — ABNORMAL LOW (ref 3.5–5.1)
Sodium: 137 mmol/L (ref 135–145)
Total Bilirubin: 0.4 mg/dL (ref 0.3–1.2)
Total Protein: 5.3 g/dL — ABNORMAL LOW (ref 6.5–8.1)

## 2021-09-16 LAB — GLUCOSE, CAPILLARY: Glucose-Capillary: 94 mg/dL (ref 70–99)

## 2021-09-16 LAB — HEMOGLOBIN A1C
Hgb A1c MFr Bld: 5.4 % (ref 4.8–5.6)
Mean Plasma Glucose: 108.28 mg/dL

## 2021-09-16 LAB — HIV ANTIBODY (ROUTINE TESTING W REFLEX): HIV Screen 4th Generation wRfx: NONREACTIVE

## 2021-09-16 LAB — MAGNESIUM: Magnesium: 2 mg/dL (ref 1.7–2.4)

## 2021-09-16 MED ORDER — FAMOTIDINE 20 MG PO TABS
20.0000 mg | ORAL_TABLET | Freq: Every day | ORAL | Status: DC
  Administered 2021-09-16 – 2021-09-18 (×3): 20 mg via ORAL
  Filled 2021-09-16 (×3): qty 1

## 2021-09-16 MED ORDER — ONDANSETRON HCL 4 MG PO TABS: 4.0000 mg | ORAL_TABLET | Freq: Four times a day (QID) | ORAL | Status: DC | PRN

## 2021-09-16 MED ORDER — ROSUVASTATIN CALCIUM 10 MG PO TABS
40.0000 mg | ORAL_TABLET | Freq: Every day | ORAL | Status: DC
  Administered 2021-09-16 – 2021-09-19 (×4): 40 mg via ORAL
  Filled 2021-09-16 (×3): qty 4
  Filled 2021-09-16: qty 2
  Filled 2021-09-16: qty 4

## 2021-09-16 MED ORDER — POTASSIUM CHLORIDE 20 MEQ PO PACK
40.0000 meq | PACK | Freq: Once | ORAL | Status: AC
Start: 2021-09-16 — End: 2021-09-16
  Administered 2021-09-16: 40 meq via ORAL
  Filled 2021-09-16: qty 2

## 2021-09-16 MED ORDER — IOHEXOL 350 MG/ML SOLN
60.0000 mL | Freq: Once | INTRAVENOUS | Status: AC | PRN
  Administered 2021-09-16: 60 mL via INTRAVENOUS

## 2021-09-16 MED ORDER — NITROGLYCERIN 0.4 MG SL SUBL: 0.4000 mg | SUBLINGUAL_TABLET | SUBLINGUAL | Status: DC | PRN

## 2021-09-16 MED ORDER — PANTOPRAZOLE SODIUM 40 MG PO TBEC
40.0000 mg | DELAYED_RELEASE_TABLET | Freq: Two times a day (BID) | ORAL | Status: DC
  Administered 2021-09-16 – 2021-09-19 (×7): 40 mg via ORAL
  Filled 2021-09-16 (×7): qty 1

## 2021-09-16 MED ORDER — ACETAMINOPHEN 650 MG RE SUPP: 650.0000 mg | Freq: Four times a day (QID) | RECTAL | Status: DC | PRN

## 2021-09-16 MED ORDER — FENOFIBRATE 160 MG PO TABS
160.0000 mg | ORAL_TABLET | Freq: Every day | ORAL | Status: DC
  Administered 2021-09-16 – 2021-09-19 (×4): 160 mg via ORAL
  Filled 2021-09-16 (×4): qty 1

## 2021-09-16 MED ORDER — SODIUM CHLORIDE 0.9 % IV SOLN
1.0000 g | INTRAVENOUS | Status: DC
  Administered 2021-09-16 – 2021-09-19 (×4): 1 g via INTRAVENOUS
  Filled 2021-09-16 (×4): qty 10

## 2021-09-16 MED ORDER — IRBESARTAN 75 MG PO TABS
37.5000 mg | ORAL_TABLET | Freq: Every day | ORAL | Status: DC
  Administered 2021-09-16 – 2021-09-19 (×4): 37.5 mg via ORAL
  Filled 2021-09-16: qty 1
  Filled 2021-09-16: qty 0.5
  Filled 2021-09-16 (×2): qty 1

## 2021-09-16 MED ORDER — INSULIN ASPART 100 UNIT/ML IJ SOLN
0.0000 [IU] | Freq: Three times a day (TID) | INTRAMUSCULAR | Status: DC
  Filled 2021-09-16: qty 0.15

## 2021-09-16 MED ORDER — HYDRALAZINE HCL 20 MG/ML IJ SOLN: 10.0000 mg | Freq: Four times a day (QID) | INTRAMUSCULAR | Status: DC | PRN

## 2021-09-16 MED ORDER — LACTATED RINGERS IV SOLN: INTRAVENOUS | Status: AC

## 2021-09-16 MED ORDER — ALLOPURINOL 300 MG PO TABS
300.0000 mg | ORAL_TABLET | Freq: Two times a day (BID) | ORAL | Status: DC
  Administered 2021-09-16 – 2021-09-19 (×7): 300 mg via ORAL
  Filled 2021-09-16 (×8): qty 1

## 2021-09-16 MED ORDER — ACETAMINOPHEN 325 MG PO TABS
650.0000 mg | ORAL_TABLET | Freq: Four times a day (QID) | ORAL | Status: DC | PRN
  Administered 2021-09-17: 650 mg via ORAL
  Filled 2021-09-16: qty 2

## 2021-09-16 MED ORDER — ISOSORBIDE MONONITRATE ER 30 MG PO TB24
30.0000 mg | ORAL_TABLET | Freq: Every day | ORAL | Status: DC
  Administered 2021-09-16 – 2021-09-19 (×4): 30 mg via ORAL
  Filled 2021-09-16 (×4): qty 1

## 2021-09-16 MED ORDER — ENOXAPARIN SODIUM 40 MG/0.4ML IJ SOSY
40.0000 mg | PREFILLED_SYRINGE | INTRAMUSCULAR | Status: DC
  Administered 2021-09-16 – 2021-09-19 (×4): 40 mg via SUBCUTANEOUS
  Filled 2021-09-16 (×4): qty 0.4

## 2021-09-16 MED ORDER — SODIUM CHLORIDE 0.9 % IV BOLUS
250.0000 mL | Freq: Once | INTRAVENOUS | Status: AC
  Administered 2021-09-16: 250 mL via INTRAVENOUS

## 2021-09-16 MED ORDER — POLYETHYLENE GLYCOL 3350 17 G PO PACK: 17.0000 g | PACK | Freq: Every day | ORAL | Status: DC | PRN

## 2021-09-16 MED ORDER — ASPIRIN EC 81 MG PO TBEC
81.0000 mg | DELAYED_RELEASE_TABLET | Freq: Every day | ORAL | Status: DC
  Administered 2021-09-16 – 2021-09-19 (×4): 81 mg via ORAL
  Filled 2021-09-16 (×4): qty 1

## 2021-09-16 MED ORDER — ONDANSETRON HCL 4 MG/2ML IJ SOLN: 4.0000 mg | Freq: Four times a day (QID) | INTRAMUSCULAR | Status: DC | PRN

## 2021-09-16 NOTE — ED Notes (Signed)
ED TO INPATIENT HANDOFF REPORT  Name/Age/Gender Rachel Black 71 y.o. female  Code Status    Code Status Orders  (From admission, onward)           Start     Ordered   09/16/21 0204  Full code  Continuous        09/16/21 0204           Code Status History     Date Active Date Inactive Code Status Order ID Comments User Context   04/27/2020 0524 04/30/2020 0306 Full Code [id number]  Elodia Florence., MD ED   10/16/2017 2007 10/18/2017 1900 Full Code [id number]  Tommie Raymond, NP Inpatient   03/14/2017 1218 03/18/2017 1504 Full Code [id number]  Annita Brod, MD Inpatient   12/07/2016 0508 12/10/2016 2135 Full Code [id number]  Etta Quill, DO ED   08/23/2015 1804 08/26/2015 1845 Full Code [id number]  Mcarthur Rossetti, MD Inpatient   04/20/2014 2036 04/25/2014 2137 Full Code [id number]  Mcarthur Rossetti, MD Inpatient       Home/SNF/Other Home  Chief Complaint Acute metabolic encephalopathy [W88.89]  Level of Care/Admitting Diagnosis ED Disposition     ED Disposition  Admit   Condition  --   Comment  Hospital Area: [facility] [[id number]]  Level of Care: Telemetry [5]  Admit to tele based on following criteria: Monitor for Ischemic changes  May admit patient to [facility] or [facility] if equivalent level of care is available:: No  Covid Evaluation: Confirmed COVID Negative  Diagnosis: Acute metabolic encephalopathy [1694503]  Admitting Physician: Vernelle Emerald [[id number]]  Attending Physician: Tarry Kos  Estimated length of stay: past midnight tomorrow  Certification:: I certify this patient will need inpatient services for at least 2 midnights  Bed request comments: [id number]          Medical History Past Medical History:  Diagnosis Date   Arthritis    PAIN AND OA BOTH KNEES AND SHOULDERS AND ELBOWS AND WRIST   Blood transfusion without reported diagnosis 2018   after cholecystectomy   Diabetes  mellitus    ORAL MEDICATION   GERD (gastroesophageal reflux disease)    Gout    NO RECENT FLARE UPS   H/O: rheumatic fever    AS A CHILD - NO KNOWN HEART MURMUR OR HEART PROBLEMS   Heart attack ([place])    Hyperlipidemia    Hypertension    Stroke ([place]) 03/2017    Allergies Allergies  Allergen Reactions   Other Other (See Comments)    SOME BANDAIDS CAUSE SKIN IRRITATION   Penicillins Itching    Has patient had a PCN reaction causing immediate rash, facial/tongue/throat swelling, SOB or lightheadedness with hypotension: No Has patient had a PCN reaction causing severe rash involving mucus membranes or skin necrosis: No Has patient had a PCN reaction that required hospitalization No Has patient had a PCN reaction occurring within the last 10 years: No If all of the above answers are "NO", then may proceed with Cephalosporin use.     IV Location/Drains/Wounds Patient Lines/Drains/Airways Status     Active Line/Drains/Airways     Name Placement date Placement time Site Days   Peripheral IV 09/16/21 20 G 1" Right Antecubital 09/16/21  --  Antecubital  less than 1   Incision (Closed) 12/09/16 Abdomen Other (Comment) 12/09/16  1602  -- 1742   Incision - 4 Ports Abdomen 1: Right;Lower 2: Right;Superior 3: Umbilicus 4: Umbilicus;Superior 12/09/16  1446  -- 1742            Labs/Imaging Results for orders placed or performed during the hospital encounter of 09/15/21 (from the past 48 hour(s))  Resp Panel by RT-PCR (Flu A&B, Covid) Nasopharyngeal Swab     Status: Abnormal   Collection Time: 09/15/21  7:44 AM   Specimen: Nasopharyngeal Swab; Nasopharyngeal(NP) swabs in vial transport medium  Result Value Ref Range   SARS Coronavirus 2 by RT PCR NEGATIVE NEGATIVE    Comment: (NOTE) SARS-CoV-2 target nucleic acids are NOT DETECTED.  The SARS-CoV-2 RNA is generally detectable in upper respiratory specimens during the acute phase of infection. The lowest concentration of SARS-CoV-2  viral copies this assay can detect is 138 copies/mL. A negative result does not preclude SARS-Cov-2 infection and should not be used as the sole basis for treatment or other patient management decisions. A negative result may occur with  improper specimen collection/handling, submission of specimen other than nasopharyngeal swab, presence of viral mutation(s) within the areas targeted by this assay, and inadequate number of viral copies(<138 copies/mL). A negative result must be combined with clinical observations, patient history, and epidemiological information. The expected result is Negative.  Fact Sheet for Patients:  EntrepreneurPulse.com.au  Fact Sheet for Healthcare Providers:  IncredibleEmployment.be  This test is no t yet approved or cleared by the [place] FDA and  has been authorized for detection and/or diagnosis of SARS-CoV-2 by FDA under an Emergency Use Authorization (EUA). This EUA will remain  in effect (meaning this test can be used) for the duration of the COVID-19 declaration under Section 564(b)(1) of the Act, 21 U.S.C.section 360bbb-3(b)(1), unless the authorization is terminated  or revoked sooner.       Influenza A by PCR POSITIVE (A) NEGATIVE   Influenza B by PCR NEGATIVE NEGATIVE    Comment: (NOTE) The Xpert Xpress SARS-CoV-2/FLU/RSV plus assay is intended as an aid in the diagnosis of influenza from Nasopharyngeal swab specimens and should not be used as a sole basis for treatment. Nasal washings and aspirates are unacceptable for Xpert Xpress SARS-CoV-2/FLU/RSV testing.  Fact Sheet for Patients: EntrepreneurPulse.com.au  Fact Sheet for Healthcare Providers: IncredibleEmployment.be  This test is not yet approved or cleared by the [place] FDA and has been authorized for detection and/or diagnosis of SARS-CoV-2 by FDA under an Emergency Use Authorization (EUA). This  EUA will remain in effect (meaning this test can be used) for the duration of the COVID-19 declaration under Section 564(b)(1) of the Act, 21 U.S.C. section 360bbb-3(b)(1), unless the authorization is terminated or revoked.  Performed at [facility], [place] [address] [place], [place] [address]   CBG monitoring, ED     Status: Abnormal   Collection Time: 09/15/21  7:51 AM  Result Value Ref Range   Glucose-Capillary 107 (H) 70 - 99 mg/dL    Comment: Glucose reference range applies only to samples taken after fasting for at least 8 hours.  Basic metabolic panel     Status: Abnormal   Collection Time: 09/15/21  8:02 AM  Result Value Ref Range   Sodium 134 (L) 135 - 145 mmol/L   Potassium 4.0 3.5 - 5.1 mmol/L   Chloride 102 98 - 111 mmol/L   CO2 22 22 - 32 mmol/L   Glucose, Bld 105 (H) 70 - 99 mg/dL    Comment: Glucose reference range applies only to samples taken after fasting for at least 8 hours.   BUN 22 8 - 23  mg/dL   Creatinine, Ser 1.48 (H) 0.44 - 1.00 mg/dL   Calcium 10.0 8.9 - 10.3 mg/dL   GFR, Estimated 38 (L) >60 mL/min    Comment: (NOTE) Calculated using the CKD-EPI Creatinine Equation (2021)    Anion gap 10 5 - 15    Comment: Performed at [facility], [place] [address], [place], [place] [address]  CBC     Status: None   Collection Time: 09/15/21  8:02 AM  Result Value Ref Range   WBC 5.8 4.0 - 10.5 K/uL   RBC 3.87 3.87 - 5.11 MIL/uL   Hemoglobin 12.2 12.0 - 15.0 g/dL   HCT 36.4 36.0 - 46.0 %   MCV 94.1 80.0 - 100.0 fL   MCH 31.5 26.0 - 34.0 pg   MCHC 33.5 30.0 - 36.0 g/dL   RDW 14.1 11.5 - 15.5 %   Platelets 150 150 - 400 K/uL   nRBC 0.0 0.0 - 0.2 %    Comment: Performed at [facility], [place] [address], [place], [place] [address]  Urinalysis, Routine w reflex microscopic Urine, Clean Catch     Status: Abnormal   Collection Time: 09/15/21 12:15 PM  Result Value Ref Range   Color, Urine AMBER (A) YELLOW     Comment: BIOCHEMICALS MAY BE AFFECTED BY COLOR   APPearance HAZY (A) CLEAR   Specific Gravity, Urine 1.024 1.005 - 1.030   pH 5.0 5.0 - 8.0   Glucose, UA NEGATIVE NEGATIVE mg/dL   Hgb urine dipstick NEGATIVE NEGATIVE   Bilirubin Urine NEGATIVE NEGATIVE   Ketones, ur NEGATIVE NEGATIVE mg/dL   Protein, ur NEGATIVE NEGATIVE mg/dL   Nitrite POSITIVE (A) NEGATIVE   Leukocytes,Ua LARGE (A) NEGATIVE   RBC / HPF 0-5 0 - 5 RBC/hpf   WBC, UA 21-50 0 - 5 WBC/hpf   Bacteria, UA MANY (A) NONE SEEN   Squamous Epithelial / LPF 6-10 0 - 5   Mucus PRESENT    Hyaline Casts, UA PRESENT     Comment: Performed at [facility], [place] [address], [place], [place] [address]  CBG monitoring, ED     Status: Abnormal   Collection Time: 09/15/21  9:49 PM  Result Value Ref Range   Glucose-Capillary 140 (H) 70 - 99 mg/dL    Comment: Glucose reference range applies only to samples taken after fasting for at least 8 hours.  Hepatic function panel     Status: Abnormal   Collection Time: 09/15/21 10:41 PM  Result Value Ref Range   Total Protein 7.2 6.5 - 8.1 g/dL   Albumin 4.3 3.5 - 5.0 g/dL   AST 52 (H) 15 - 41 U/L   ALT 26 0 - 44 U/L   Alkaline Phosphatase 43 38 - 126 U/L   Total Bilirubin 0.7 0.3 - 1.2 mg/dL   Bilirubin, Direct 0.3 (H) 0.0 - 0.2 mg/dL   Indirect Bilirubin 0.4 0.3 - 0.9 mg/dL    Comment: Performed at [facility], [place] [address], [place], [place] [address]  HIV Antibody (routine testing w rflx)     Status: None   Collection Time: 09/16/21  4:06 AM  Result Value Ref Range   HIV Screen 4th Generation wRfx Non Reactive Non Reactive    Comment: Performed at [facility], [place] [address] [place], [place] [address]  Comprehensive metabolic panel     Status: Abnormal   Collection Time: 09/16/21  4:06 AM  Result Value Ref Range  Sodium 137 135 - 145 mmol/L   Potassium 3.4 (L) 3.5 - 5.1 mmol/L   Chloride 105 98 - 111 mmol/L   CO2 22 22 - 32 mmol/L    Glucose, Bld 97 70 - 99 mg/dL    Comment: Glucose reference range applies only to samples taken after fasting for at least 8 hours.   BUN 29 (H) 8 - 23 mg/dL   Creatinine, Ser 1.52 (H) 0.44 - 1.00 mg/dL   Calcium 8.3 (L) 8.9 - 10.3 mg/dL   Total Protein 5.3 (L) 6.5 - 8.1 g/dL   Albumin 3.0 (L) 3.5 - 5.0 g/dL   AST 38 15 - 41 U/L   ALT 20 0 - 44 U/L   Alkaline Phosphatase 33 (L) 38 - 126 U/L   Total Bilirubin 0.4 0.3 - 1.2 mg/dL   GFR, Estimated 37 (L) >60 mL/min    Comment: (NOTE) Calculated using the CKD-EPI Creatinine Equation (2021)    Anion gap 10 5 - 15    Comment: Performed at [facility], [place] [address] [place], [place] [address]  Magnesium     Status: None   Collection Time: 09/16/21  4:06 AM  Result Value Ref Range   Magnesium 2.0 1.7 - 2.4 mg/dL    Comment: Performed at [facility], [place] [address] [place], [place] [address]  Hemoglobin A1c     Status: None   Collection Time: 09/16/21  4:06 AM  Result Value Ref Range   Hgb A1c MFr Bld 5.4 4.8 - 5.6 %    Comment: (NOTE) Pre diabetes:          5.7%-6.4%  Diabetes:              >6.4%  Glycemic control for   <7.0% adults with diabetes    Mean Plasma Glucose 108.28 mg/dL    Comment: Performed at [place] [address] [place], [place] [address]  CBC with Differential/Platelet     Status: Abnormal   Collection Time: 09/16/21  4:48 AM  Result Value Ref Range   WBC 4.6 4.0 - 10.5 K/uL   RBC 3.04 (L) 3.87 - 5.11 MIL/uL   Hemoglobin 9.4 (L) 12.0 - 15.0 g/dL   HCT 28.8 (L) 36.0 - 46.0 %   MCV 94.7 80.0 - 100.0 fL   MCH 30.9 26.0 - 34.0 pg   MCHC 32.6 30.0 - 36.0 g/dL   RDW 14.0 11.5 - 15.5 %   Platelets 103 (L) 150 - 400 K/uL    Comment: SPECIMEN CHECKED FOR CLOTS Immature Platelet Fraction may be clinically indicated, consider ordering this additional test [id number] REPEATED TO VERIFY PLATELET COUNT CONFIRMED BY SMEAR    nRBC 0.0 0.0 - 0.2 %   Neutrophils  Relative % 60 %   Neutro Abs 2.7 1.7 - 7.7 K/uL   Lymphocytes Relative 33 %   Lymphs Abs 1.5 0.7 - 4.0 K/uL   Monocytes Relative 6 %   Monocytes Absolute 0.3 0.1 - 1.0 K/uL   Eosinophils Relative 0 %   Eosinophils Absolute 0.0 0.0 - 0.5 K/uL   Basophils Relative 0 %   Basophils Absolute 0.0 0.0 - 0.1 K/uL   Immature Granulocytes 1 %   Abs Immature Granulocytes 0.03 0.00 - 0.07 K/uL    Comment: Performed at [facility], [place] [address], [place], [place] [address]  CBG monitoring, ED     Status: None   Collection Time: 09/16/21  8:22 AM  Result Value Ref Range   Glucose-Capillary 98 70 - 99 mg/dL    Comment: Glucose reference range applies only to samples taken after fasting for at least 8 hours.  CBG monitoring, ED     Status: None   Collection Time: 09/16/21 12:02 PM  Result Value Ref Range   Glucose-Capillary 92 70 - 99 mg/dL    Comment: Glucose reference range applies only to samples taken after fasting for at least 8 hours.  CBG monitoring, ED     Status: None   Collection Time: 09/16/21  4:37 PM  Result Value Ref Range   Glucose-Capillary 95 70 - 99 mg/dL    Comment: Glucose reference range applies only to samples taken after fasting for at least 8 hours.   CT ABDOMEN PELVIS W CONTRAST  Result Date: 09/16/2021 CLINICAL DATA:  Weakness and lower abdominal pain. EXAM: CT ABDOMEN AND PELVIS WITH CONTRAST TECHNIQUE: Multidetector CT imaging of the abdomen and pelvis was performed using the standard protocol following bolus administration of intravenous contrast. CONTRAST:  28mL OMNIPAQUE IOHEXOL 350 MG/ML SOLN COMPARISON:  December 07, 2016 FINDINGS: Lower chest: No acute abnormality. Hepatobiliary: There is diffuse fatty infiltration of the liver parenchyma. No focal liver abnormality is seen. Status post cholecystectomy. No biliary dilatation. Pancreas: Unremarkable. No pancreatic ductal dilatation or surrounding inflammatory changes. Spleen: Normal in size without  focal abnormality. Adrenals/Urinary Tract: Adrenal glands are unremarkable. Kidneys are normal in size, without renal calculi or hydronephrosis. A 1.4 cm diameter simple cyst is seen within the lateral aspect of the mid left kidney. Bladder is unremarkable. Stomach/Bowel: Stomach is within normal limits. The appendix is not clearly identified. No evidence of bowel wall thickening, distention, or inflammatory changes. Noninflamed diverticula are seen within the descending and sigmoid colon. Mild thickening of the proximal to mid sigmoid colon is also seen. Vascular/Lymphatic: No significant vascular findings are present. No enlarged abdominal or pelvic lymph nodes. Reproductive: Status post hysterectomy. No adnexal masses. Other: No abdominal wall hernia or abnormality. No abdominopelvic ascites. Musculoskeletal: Multilevel degenerative changes are seen throughout the lumbar spine. IMPRESSION: 1. Colonic diverticulosis. 2. Mild thickening of the proximal to mid sigmoid colon which may represent mild colitis. 3. Hepatic steatosis. 4. Evidence of prior cholecystectomy and hysterectomy. Electronically Signed   By: Virgina Norfolk M.D.   On: 09/16/2021 03:11   DG Chest Port 1 View  Result Date: 09/15/2021 CLINICAL DATA:  Cough. EXAM: PORTABLE CHEST 1 VIEW COMPARISON:  Chest x-ray 04/29/2020. FINDINGS: The heart is mildly enlarged, unchanged. There is no focal lung infiltrate, pleural effusion or pneumothorax identified. No acute fractures are seen. IMPRESSION: 1. No acute cardiopulmonary process. 2. Stable cardiomegaly. Electronically Signed   By: Ronney Asters M.D.   On: 09/15/2021 22:36   DG Foot Complete Right  Result Date: 09/15/2021 CLINICAL DATA:  Trauma, pain EXAM: RIGHT FOOT COMPLETE - 3+ VIEW COMPARISON:  None. FINDINGS: There is 4 mm calcific density adjacent to the medial aspect of base of first metatarsal. Deformity is seen in the necks of right third and fourth metatarsals. There is break in the  cortical margins in the neck of third and fourth metatarsals. Osteopenia is seen in bony structures. Hallux valgus deformity is noted. Plantar spur is seen in calcaneus. Degenerative changes are noted in the right ankle joint. IMPRESSION: Recent comminuted, essentially undisplaced fractures are seen in the the necks of right third and fourth metatarsals. There is 4 mm calcific density adjacent to the base of first metatarsal which may  suggest recent or old avulsion. Other chronic findings as described in the body of the report. Electronically Signed   By: Elmer Picker M.D.   On: 09/15/2021 08:39    Pending Labs Unresulted Labs (From admission, onward)     Start     Ordered   09/17/21 0500  CBC  Tomorrow morning,   R       Question:  Specimen collection method  Answer:  Unit=Unit collect   09/16/21 1428   09/17/21 0500  Comprehensive metabolic panel  Tomorrow morning,   R       Question:  Specimen collection method  Answer:  Unit=Unit collect   09/16/21 1428   09/17/21 0500  Magnesium  Tomorrow morning,   R       Question:  Specimen collection method  Answer:  Unit=Unit collect   09/16/21 1428   09/17/21 0500  Phosphorus  Tomorrow morning,   R       Question:  Specimen collection method  Answer:  Unit=Unit collect   09/16/21 1428   09/16/21 0500  CBC WITH DIFFERENTIAL  Tomorrow morning,   R        09/16/21 0204            Vitals/Pain Today's Vitals   09/16/21 1500 09/16/21 1600 09/16/21 1630 09/16/21 1700  BP: (!) 95/53 93/63 103/71 (!) 91/54  Pulse: 67 64 (!) 59 (!) 55  Resp: 16 19 (!) 23 (!) 21  Temp:      TempSrc:      SpO2: 100% 100% 97% 100%  Weight:      Height:      PainSc:        Isolation Precautions No active isolations  Medications Medications  allopurinol (ZYLOPRIM) tablet 300 mg (300 mg Oral Given 09/16/21 0935)  aspirin EC tablet 81 mg (81 mg Oral Given 09/16/21 0935)  fenofibrate tablet 160 mg (160 mg Oral Given 09/16/21 0937)  isosorbide mononitrate  (IMDUR) 24 hr tablet 30 mg (30 mg Oral Given 09/16/21 0937)  nitroGLYCERIN (NITROSTAT) SL tablet 0.4 mg (has no administration in time range)  irbesartan (AVAPRO) tablet 37.5 mg (37.5 mg Oral Given 09/16/21 0935)  rosuvastatin (CRESTOR) tablet 40 mg (40 mg Oral Given 09/16/21 0936)  pantoprazole (PROTONIX) EC tablet 40 mg (40 mg Oral Given 09/16/21 1640)  famotidine (PEPCID) tablet 20 mg (has no administration in time range)  insulin aspart (novoLOG) injection 0-15 Units (0 Units Subcutaneous Not Given 09/16/21 1640)  enoxaparin (LOVENOX) injection 40 mg (40 mg Subcutaneous Given 09/16/21 0939)  acetaminophen (TYLENOL) tablet 650 mg (has no administration in time range)    Or  acetaminophen (TYLENOL) suppository 650 mg (has no administration in time range)  polyethylene glycol (MIRALAX / GLYCOLAX) packet 17 g (has no administration in time range)  ondansetron (ZOFRAN) tablet 4 mg (has no administration in time range)    Or  ondansetron (ZOFRAN) injection 4 mg (has no administration in time range)  lactated ringers infusion ( Intravenous Not Given 09/16/21 0315)  cefTRIAXone (ROCEPHIN) 1 g in sodium chloride 0.9 % 100 mL IVPB (0 g Intravenous Stopped 09/16/21 0355)  hydrALAZINE (APRESOLINE) injection 10 mg (has no administration in time range)  lactated ringers infusion ( Intravenous Infusion Verify 09/16/21 1412)  cephALEXin (KEFLEX) capsule 500 mg (500 mg Oral Given 09/15/21 2238)  oseltamivir (TAMIFLU) capsule 75 mg (75 mg Oral Given 09/15/21 2237)  iohexol (OMNIPAQUE) 350 MG/ML injection 60 mL (60 mLs Intravenous Contrast Given 09/16/21 0245)  potassium  chloride (KLOR-CON) packet 40 mEq (40 mEq Oral Given 09/16/21 0938)  sodium chloride 0.9 % bolus 250 mL (0 mLs Intravenous Stopped 09/16/21 1236)  sodium chloride 0.9 % bolus 250 mL (0 mLs Intravenous Stopped 09/16/21 1302)    Mobility walks with person assist

## 2021-09-16 NOTE — ED Notes (Signed)
Bladder scan showed 33mL. MD and RN notified.

## 2021-09-16 NOTE — Assessment & Plan Note (Signed)
?   Patient is currently chest pain free ?? Monitoring patient on telemetry ?? Continue home regimen of antiplatelet therapy, lipid lowering therapy  ? ?

## 2021-09-16 NOTE — Assessment & Plan Note (Signed)
.   Continuing home regimen of lipid lowering therapy.  

## 2021-09-16 NOTE — Assessment & Plan Note (Signed)
.   Resume patients home regimen of oral antihypertensives . Titrate antihypertensive regimen as necessary to achieve adequate BP control . PRN intravenous antihypertensives for excessively elevated blood pressure   

## 2021-09-16 NOTE — ED Notes (Addendum)
Bladder re-scanned with 67 ML in bladder, MD notified

## 2021-09-16 NOTE — H&P (Signed)
History and Physical    Rachel Black FXT:[id number] DOB: 01/07/1951 DOA: 09/15/2021  PCP: Lorrene Reid, PA-C  Patient coming from: Home   Chief Complaint:  Chief Complaint  Patient presents with   Weakness     HPI:    71 year old female with past medical history of coronary artery disease (NSTEMI 10/2017 S/P Cath with occluded 2nd marginal treated medically),  non-insulin-dependent diabetes mellitus type 2, common bile duct stone status postextraction via ERCP (2021), hypertension, hyperlipidemia, gout, gastroesophageal reflux disease who presented to [facility] emergency department via EMS status post fall complaining of left foot pain and abdominal pain.  Patient is a poor historian due to confusion and therefore majority of history is obtained from discussion with emergency department staff.  Attempts have been made to contact husband via the listed phone numbers and have been unsuccessful.    Approximately 2 days ago patient has been exhibiting increasing lethargy and confusion according to family.  Earlier in the day on 1/2, patient to get a bed and tripped on an object on the floor in her bedroom.  Husband was able to keep her from falling or causing any injury.  But she struck her right foot on an object in her room with the fall resulting in severe right foot pain.  There have been no reports of recent nausea, vomiting, shortness of breath, cough, diarrhea, fevers.    Due to patient's complaints of right foot pain and progressively worsening confusion the patient was eventually brought into [facility] emergency department for evaluation.  Upon evaluation in the emergency department Influenza PCR testing was found to be positive for influenza A.  Patient was also found to be complaining of abdominal pain and therefore urinalysis was performed and found to be suggestive of a urinary tract infection.  Right foot x-ray revealed nondisplaced fractures of the third  and fourth metatarsal right foot.  Patient was administered Tamiflu and Keflex and initiated on intravenous fluids.  A surgical boot was placed on the right foot.  Hospitalist group was then called to assess the patient for admission to the hospital.    Review of Systems:   Review of Systems  Unable to perform ROS: Mental status change   Past Medical History:  Diagnosis Date   Arthritis    PAIN AND OA BOTH KNEES AND SHOULDERS AND ELBOWS AND WRIST   Blood transfusion without reported diagnosis 2018   after cholecystectomy   Diabetes mellitus    ORAL MEDICATION   GERD (gastroesophageal reflux disease)    Gout    NO RECENT FLARE UPS   H/O: rheumatic fever    AS A CHILD - NO KNOWN HEART MURMUR OR HEART PROBLEMS   Heart attack ([place])    Hyperlipidemia    Hypertension    Stroke ([place]) 03/2017    Past Surgical History:  Procedure Laterality Date   ABDOMINAL HYSTERECTOMY     CESAREAN SECTION     CHOLECYSTECTOMY N/A 12/09/2016   Procedure: LAPAROSCOPIC CHOLECYSTECTOMY WITH INTRAOPERATIVE CHOLANGIOGRAM;  Surgeon: Stark Klein, MD;  Location: [facility];  Service: General;  Laterality: N/A;   COLONOSCOPY WITH PROPOFOL N/A 01/30/2013   Procedure: COLONOSCOPY WITH PROPOFOL;  Surgeon: Irene Shipper, MD;  Location: [facility] ENDOS[facility];  Service: Endoscopy;  Laterality: N/A;   ERCP N/A 12/08/2016   Procedure: ENDOSCOPIC RETROGRADE CHOLANGIOPANCREATOGRAPHY (ERCP);  Surgeon: Ladene Artist, MD;  Location: [facility];  Service: Endoscopy;  Laterality: N/A;   ESOPHAGOGASTRODUODENOSCOPY (EGD) WITH PROPOFOL N/A 01/30/2013  Procedure: ESOPHAGOGASTRODUODENOSCOPY (EGD) WITH PROPOFOL;  Surgeon: Irene Shipper, MD;  Location: [facility];  Service: Endoscopy;  Laterality: N/A;   LEFT HEART CATH AND CORONARY ANGIOGRAPHY N/A 10/18/2017   Procedure: LEFT HEART CATH AND CORONARY ANGIOGRAPHY;  Surgeon: Martinique, Peter M, MD;  Location: [place] [facility];  Service: Cardiovascular;  Laterality: N/A;   LOOP RECORDER INSERTION  N/A 03/18/2017   Procedure: Loop Recorder Insertion;  Surgeon: Constance Haw, MD;  Location: [place] [facility];  Service: Cardiovascular;  Laterality: N/A;   PARTIAL HYSTERECTOMY     TEE WITHOUT CARDIOVERSION N/A 03/16/2017   Procedure: TRANSESOPHAGEAL ECHOCARDIOGRAM (TEE);  Surgeon: Acie Fredrickson Wonda Cheng, MD;  Location: [facility] ENDOSCOPY;  Service: Cardiovascular;  Laterality: N/A;   TOTAL KNEE ARTHROPLASTY Right 04/20/2014   Procedure: RIGHT TOTAL KNEE ARTHROPLASTY CONVERTED TO RIGHT KNEE REIMPLANTATION;  Surgeon: Mcarthur Rossetti, MD;  Location: [facility];  Service: Orthopedics;  Laterality: Right;   TOTAL KNEE ARTHROPLASTY Left 08/23/2015   Procedure: LEFT TOTAL KNEE ARTHROPLASTY;  Surgeon: Mcarthur Rossetti, MD;  Location: [facility];  Service: Orthopedics;  Laterality: Left;   VENTRAL HERNIA REPAIR     x2     reports that she has never smoked. She has never used smokeless tobacco. She reports that she does not drink alcohol and does not use drugs.  Allergies  Allergen Reactions   Other Other (See Comments)    SOME BANDAIDS CAUSE SKIN IRRITATION   Penicillins Itching    Has patient had a PCN reaction causing immediate rash, facial/tongue/throat swelling, SOB or lightheadedness with hypotension: No Has patient had a PCN reaction causing severe rash involving mucus membranes or skin necrosis: No Has patient had a PCN reaction that required hospitalization No Has patient had a PCN reaction occurring within the last 10 years: No If all of the above answers are "NO", then may proceed with Cephalosporin use.     Family History  Problem Relation Age of Onset   Diabetes Mother    Hypertension Mother        entire family   Breast cancer Mother        diagnosed in her 51's   Stroke Father        CVA   Hyperlipidemia Father        entire family   Diabetes Father    Heart disease Father        No details.  Not at an early age.     Crohn's disease Son    Irritable bowel syndrome Son       Prior to Admission medications   Medication Sig Start Date End Date Taking? Authorizing Provider  allopurinol (ZYLOPRIM) 300 MG tablet Take 1 tablet (300 mg total) by mouth 2 (two) times daily. 08/26/21  Yes Lorrene Reid, PA-C  aspirin 81 MG EC tablet Take 1 tablet (81 mg total) by mouth daily. 10/18/17  Yes Cheryln Manly, NP  Calcium Carb-Cholecalciferol (CALCIUM 600-D PO) Take 1 tablet by mouth 2 (two) times daily before a meal.   Yes [provider]  dexlansoprazole (DEXILANT) 60 MG capsule Take 1 capsule (60 mg total) by mouth 2 (two) times daily. 11/26/20  Yes Zehr, Janett Billow D, PA-C  Emollient (UDDERLY SMOOTH) CREA Apply 1 application topically See admin instructions. Apply topically to skin folds as needed for rash/irritation   Yes [provider]  famotidine (PEPCID) 20 MG tablet Take 20 mg by mouth daily.   Yes [provider]  fenofibrate 160 MG  tablet Take 1 tablet (160 mg total) by mouth daily. 08/26/21  Yes Abonza, Herb Grays, PA-C  ferrous sulfate 325 (65 FE) MG tablet TAKE 1 TABLET BY MOUTH TWICE A DAY 08/21/21  Yes Zehr, Jessica D, PA-C  hyoscyamine (LEVSIN SL) 0.125 MG SL tablet PLACE 1-2 TABLETS (0.125-0.25 MG TOTAL) UNDER THE TONGUE EVERY 4 (FOUR) HOURS AS NEEDED. Patient taking differently: Place 0.125-0.25 mg under the tongue every 4 (four) hours as needed for cramping. 09/09/21  Yes Irene Shipper, MD  isosorbide mononitrate (IMDUR) 30 MG 24 hr tablet TAKE 1 TABLET BY MOUTH EVERY DAY 06/18/21  Yes Minus Breeding, MD  nitroGLYCERIN (NITROSTAT) 0.4 MG SL tablet Place 1 tablet (0.4 mg total) under the tongue every 5 (five) minutes x 3 doses as needed for chest pain. 08/08/19  Yes Minus Breeding, MD  olmesartan (BENICAR) 5 MG tablet Take 1 tablet (5 mg total) by mouth daily. 08/26/21  Yes Abonza, Maritza, PA-C  rosuvastatin (CRESTOR) 40 MG tablet Take 40 mg by mouth daily. 08/04/21  Yes [provider]  SitaGLIPtin-MetFORMIN HCl 50-500 MG  TB24 Take 1 tablet by mouth daily. 04/28/21  Yes Abonza, Maritza, PA-C  Vitamin D, Ergocalciferol, (DRISDOL) 1.25 MG (50000 UNIT) CAPS capsule TAKE 1 CAPSULE BY MOUTH ONE TIME PER WEEK 08/26/21  Yes Abonza, Maritza, PA-C  ACCU-CHEK SOFTCLIX LANCETS lancets Use as instructed once a day.  DX Code: E11.9 09/21/17   Roma Schanz R, DO  glucose blood (ACCU-CHEK AVIVA PLUS) test strip Use as instructed once a day.  DX Code E11.9 10/22/20   Lorrene Reid, PA-C    Physical Exam: Vitals:   09/15/21 2230 09/15/21 2242 09/15/21 2325 09/16/21 0153  BP: 102/77 102/88 102/85 101/60  Pulse: 82 100 85 81  Resp:  20 (!) 21 (!) 21  Temp:      TempSrc:      SpO2: 97% 99% 97% 98%  Weight:      Height:        Constitutional: Lethargic arousable and oriented x2,  no associated distress.   Skin: no rashes, no lesions, poor skin turgor noted. Eyes: Pupils are equally reactive to light.  No evidence of scleral icterus or conjunctival pallor.  ENMT: Dry mucous membranes noted.  Posterior pharynx clear of any exudate or lesions.   Neck: normal, supple, no masses, no thyromegaly.  No evidence of jugular venous distension.   Respiratory: clear to auscultation bilaterally, no wheezing, no crackles. Normal respiratory effort. No accessory muscle use.  Cardiovascular: Regular rate and rhythm, no murmurs / rubs / gallops. No extremity edema. 2+ pedal pulses. No carotid bruits.  Chest:   Nontender without crepitus or deformity.   Back:   Nontender without crepitus or deformity. Abdomen: Notable lower abdominal tenderness.  There is a significant fullness palpated in the suprapubic region,   Positive bowel sounds noted in all quadrants.   Musculoskeletal: No joint deformity upper and lower extremities. Good ROM, no contractures. Normal muscle tone.  Neurologic: CN 2-12 grossly intact. Sensation intact.  Patient moving all 4 extremities spontaneously.  Patient is following all commands.  Patient is responsive to  verbal stimuli.   Psychiatric: Patient exhibits normal mood with appropriate affect.  Patient seems to possess insight as to their current situation.     Labs on Admission: I have personally reviewed following labs and imaging studies -   CBC: Recent Labs  Lab 09/15/21 0802  WBC 5.8  HGB 12.2  HCT 36.4  MCV 94.1  PLT  154   Basic Metabolic Panel: Recent Labs  Lab 09/15/21 0802  NA 134*  K 4.0  CL 102  CO2 22  GLUCOSE 105*  BUN 22  CREATININE 1.48*  CALCIUM 10.0   GFR: Estimated Creatinine Clearance: 40.3 mL/min (A) (by C-G formula based on SCr of 1.48 mg/dL (H)). Liver Function Tests: Recent Labs  Lab 09/15/21 2241  AST 52*  ALT 26  ALKPHOS 43  BILITOT 0.7  PROT 7.2  ALBUMIN 4.3   No results for input(s): LIPASE, AMYLASE in the last 168 hours. No results for input(s): AMMONIA in the last 168 hours. Coagulation Profile: No results for input(s): INR, PROTIME in the last 168 hours. Cardiac Enzymes: No results for input(s): CKTOTAL, CKMB, CKMBINDEX, TROPONINI in the last 168 hours. BNP (last 3 results) No results for input(s): PROBNP in the last 8760 hours. HbA1C: No results for input(s): HGBA1C in the last 72 hours. CBG: Recent Labs  Lab 09/15/21 0751 09/15/21 2149  GLUCAP 107* 140*   Lipid Profile: No results for input(s): CHOL, HDL, LDLCALC, TRIG, CHOLHDL, LDLDIRECT in the last 72 hours. Thyroid Function Tests: No results for input(s): TSH, T4TOTAL, FREET4, T3FREE, THYROIDAB in the last 72 hours. Anemia Panel: No results for input(s): VITAMINB12, FOLATE, FERRITIN, TIBC, IRON, RETICCTPCT in the last 72 hours. Urine analysis:    Component Value Date/Time   COLORURINE AMBER (A) 09/15/2021 1215   APPEARANCEUR HAZY (A) 09/15/2021 1215   LABSPEC 1.024 09/15/2021 1215   PHURINE 5.0 09/15/2021 1215   GLUCOSEU NEGATIVE 09/15/2021 1215   HGBUR NEGATIVE 09/15/2021 1215   BILIRUBINUR NEGATIVE 09/15/2021 1215   BILIRUBINUR neg 12/09/2015 1701   KETONESUR  NEGATIVE 09/15/2021 1215   PROTEINUR NEGATIVE 09/15/2021 1215   UROBILINOGEN 0.2 12/09/2015 1701   UROBILINOGEN 0.2 04/13/2014 1422   NITRITE POSITIVE (A) 09/15/2021 1215   LEUKOCYTESUR LARGE (A) 09/15/2021 1215    Radiological Exams on Admission - Personally Reviewed: DG Chest Port 1 View  Result Date: 09/15/2021 CLINICAL DATA:  Cough. EXAM: PORTABLE CHEST 1 VIEW COMPARISON:  Chest x-ray 04/29/2020. FINDINGS: The heart is mildly enlarged, unchanged. There is no focal lung infiltrate, pleural effusion or pneumothorax identified. No acute fractures are seen. IMPRESSION: 1. No acute cardiopulmonary process. 2. Stable cardiomegaly. Electronically Signed   By: Ronney Asters M.D.   On: 09/15/2021 22:36   DG Foot Complete Right  Result Date: 09/15/2021 CLINICAL DATA:  Trauma, pain EXAM: RIGHT FOOT COMPLETE - 3+ VIEW COMPARISON:  None. FINDINGS: There is 4 mm calcific density adjacent to the medial aspect of base of first metatarsal. Deformity is seen in the necks of right third and fourth metatarsals. There is break in the cortical margins in the neck of third and fourth metatarsals. Osteopenia is seen in bony structures. Hallux valgus deformity is noted. Plantar spur is seen in calcaneus. Degenerative changes are noted in the right ankle joint. IMPRESSION: Recent comminuted, essentially undisplaced fractures are seen in the the necks of right third and fourth metatarsals. There is 4 mm calcific density adjacent to the base of first metatarsal which may suggest recent or old avulsion. Other chronic findings as described in the body of the report. Electronically Signed   By: Elmer Picker M.D.   On: 09/15/2021 08:39    EKG: Personally reviewed.  Rhythm is sinus tachycardia with heart rate of 107 bpm.  Anterior fascicular block.  No dynamic ST segment changes appreciated.  Assessment/Plan  * Acute metabolic encephalopathy- (present on admission) Patient presenting with several  days of new onset  confusion and lethargy which likely precipitated the patient's mechanical fall Patient appears to be clinically volume depleted with evidence of possible urinary tract infection as well as influenza a infection Hydrating patient with intravenous isotonic fluids Treating underlying urinary tract infection with intravenous ceftriaxone Treating underlying influenza A infection with Tamiflu Encephalopathy should be self-limiting and should resolve in the next several days as the underlying infection is improved Will expand encephalopathy work-up if mentation does not improve.  Influenza A- (present on admission) Patient found to be influenza a positive via PCR Patient found to be exhibiting a nonproductive cough without shortness of breath or hypoxia Chest x-ray revealing no evidence of acute infiltrate Considering associated encephalopathy, treating with a course of Tamiflu for now Droplet precautions As needed antitussives As needed bronchodilator therapy  Acute cystitis without hematuria- (present on admission) On presentation patient has been complaining of abdominal pain Urinalysis suggestive of urinary tract infection  Bladder scan reveals no evidence of urinary retention We will proceed with CT imaging of the abdomen considering degree of patient's abdominal pain as well as palpated lower abdominal fullness on examination Treating with intravenous ceftriaxone for now for suspected urinary tract infection  Closed nondisplaced fracture of metatarsal bone of right foot, initial encounter- (present on admission) Nondisplaced metatarsal fractures of the third and fourth metatarsal status post mechanical fall Surgical boot has been placed in the emergency department This can likely be addressed as an outpatient in orthopedic clinic. If patient will be hospitalized for several days, will consider discussing with orthopedic surgery to see if they would like to see patient while  here.  Coronary artery disease involving native coronary artery of native heart without angina pectoris- (present on admission) Patient is currently chest pain free Monitoring patient on telemetry Continue home regimen of antiplatelet therapy, lipid lowering therapy   Essential hypertension- (present on admission) Resume patients home regimen of oral antihypertensives Titrate antihypertensive regimen as necessary to achieve adequate BP control PRN intravenous antihypertensives for excessively elevated blood pressure    GERD without esophagitis- (present on admission) Continuing home regimen of daily PPI therapy.   Mixed hyperlipidemia due to type 2 diabetes mellitus ([place])- (present on admission) Continuing home regimen of lipid lowering therapy.       Code Status:  Full code  code status  Family Communication: Attempted been made to contact the husband we will listed contact numbers and have been unsuccessful.  Status is: Observation  The patient remains OBS appropriate and will d/c before 2 midnights.       Vernelle Emerald MD [facility] Pager [phone]  If 7PM-7AM, please contact night-coverage [email or web address] Use universal [place] password for that web site. If you do not have the password, please call the hospital operator.  09/16/2021, 3:13 AM

## 2021-09-16 NOTE — Assessment & Plan Note (Signed)
Continuing home regimen of daily PPI therapy.  

## 2021-09-16 NOTE — Assessment & Plan Note (Signed)
·   Nondisplaced metatarsal fractures of the third and fourth metatarsal status post mechanical fall  Surgical boot has been placed in the emergency department  This can likely be addressed as an outpatient in orthopedic clinic.  If patient will be hospitalized for several days, will consider discussing with orthopedic surgery to see if they would like to see patient while here.

## 2021-09-16 NOTE — Assessment & Plan Note (Signed)
·   Patient presenting with several days of new onset confusion and lethargy which likely precipitated the patient's mechanical fall  Patient appears to be clinically volume depleted with evidence of possible urinary tract infection as well as influenza a infection  Hydrating patient with intravenous isotonic fluids  Treating underlying urinary tract infection with intravenous ceftriaxone  Treating underlying influenza A infection with Tamiflu  Encephalopathy should be self-limiting and should resolve in the next several days as the underlying infection is improved  Will expand encephalopathy work-up if mentation does not improve.

## 2021-09-16 NOTE — Assessment & Plan Note (Signed)
·   On presentation patient has been complaining of abdominal pain  Urinalysis suggestive of urinary tract infection   Bladder scan reveals no evidence of urinary retention  We will proceed with CT imaging of the abdomen considering degree of patient's abdominal pain as well as palpated lower abdominal fullness on examination  Treating with intravenous ceftriaxone for now for suspected urinary tract infection

## 2021-09-16 NOTE — Assessment & Plan Note (Signed)
·   Patient found to be influenza a positive via PCR  Patient found to be exhibiting a nonproductive cough without shortness of breath or hypoxia  Chest x-ray revealing no evidence of acute infiltrate  Considering associated encephalopathy, treating with a course of Tamiflu for now  Droplet precautions  As needed antitussives  As needed bronchodilator therapy

## 2021-09-16 NOTE — Assessment & Plan Note (Signed)
>>  ASSESSMENT AND PLAN FOR ESSENTIAL HYPERTENSION WRITTEN ON 09/16/2021  2:25 AM BY Marinda Elk, MD  Resume patients home regimen of oral antihypertensives Titrate antihypertensive regimen as necessary to achieve adequate BP control PRN intravenous antihypertensives for excessively elevated blood pressure

## 2021-09-16 NOTE — Care Plan (Signed)
This 71 years old female with PMH significant for CAD, non-insulin dependent diabetes melitis, common bile duct stone s/p extraction via ERCP, hypertension, hyperlipidemia, gout, GERD presented to the ED s/p fall complaining of left foot pain and abdominal pain.  History is obtained from family who reports patient has been exhibiting increased lethargy and confusion for last few days and today tripped and fall on the object in her bedroom.  Upon evaluation in the ED her influenza+, COVID-, urinanalysis positive for UTI, right foot x-ray showed nondisplaced fracture of the third and fourth metatarsal right foot.  Started on Tamiflu and Keflex and IV hydration.  Patient's right foot was placed in surgical boot.  Orthopedics was consulted,  recommended outpatient orthopedic follow-up.  Patient was seen and examined,  She is alert and oriented , following commands.  Patient reports pain is better controlled.

## 2021-09-17 DIAGNOSIS — G9341 Metabolic encephalopathy: Secondary | ICD-10-CM | POA: Diagnosis not present

## 2021-09-17 LAB — CBC
HCT: 27.8 % — ABNORMAL LOW (ref 36.0–46.0)
Hemoglobin: 9.3 g/dL — ABNORMAL LOW (ref 12.0–15.0)
MCH: 31.2 pg (ref 26.0–34.0)
MCHC: 33.5 g/dL (ref 30.0–36.0)
MCV: 93.3 fL (ref 80.0–100.0)
Platelets: 107 10*3/uL — ABNORMAL LOW (ref 150–400)
RBC: 2.98 MIL/uL — ABNORMAL LOW (ref 3.87–5.11)
RDW: 13.9 % (ref 11.5–15.5)
WBC: 3.4 10*3/uL — ABNORMAL LOW (ref 4.0–10.5)
nRBC: 0 % (ref 0.0–0.2)

## 2021-09-17 LAB — COMPREHENSIVE METABOLIC PANEL
ALT: 19 U/L (ref 0–44)
AST: 35 U/L (ref 15–41)
Albumin: 3 g/dL — ABNORMAL LOW (ref 3.5–5.0)
Alkaline Phosphatase: 33 U/L — ABNORMAL LOW (ref 38–126)
Anion gap: 10 (ref 5–15)
BUN: 21 mg/dL (ref 8–23)
CO2: 21 mmol/L — ABNORMAL LOW (ref 22–32)
Calcium: 9 mg/dL (ref 8.9–10.3)
Chloride: 107 mmol/L (ref 98–111)
Creatinine, Ser: 1.16 mg/dL — ABNORMAL HIGH (ref 0.44–1.00)
GFR, Estimated: 51 mL/min — ABNORMAL LOW (ref >60)
Glucose, Bld: 93 mg/dL (ref 70–99)
Potassium: 3.7 mmol/L (ref 3.5–5.1)
Sodium: 138 mmol/L (ref 135–145)
Total Bilirubin: 0.6 mg/dL (ref 0.3–1.2)
Total Protein: 5.4 g/dL — ABNORMAL LOW (ref 6.5–8.1)

## 2021-09-17 LAB — GLUCOSE, CAPILLARY
Glucose-Capillary: 86 mg/dL (ref 70–99)
Glucose-Capillary: 102 mg/dL — ABNORMAL HIGH (ref 70–99)
Glucose-Capillary: 91 mg/dL (ref 70–99)
Glucose-Capillary: 81 mg/dL (ref 70–99)

## 2021-09-17 LAB — MAGNESIUM: Magnesium: 2 mg/dL (ref 1.7–2.4)

## 2021-09-17 LAB — PHOSPHORUS: Phosphorus: 2.9 mg/dL (ref 2.5–4.6)

## 2021-09-17 NOTE — Progress Notes (Signed)
PROGRESS NOTE    Rachel Black  XBM:[id number] DOB: 1951/02/28 DOA: 09/15/2021  PCP: Lorrene Reid, PA-C   Brief Narrative:  This 71 years old female with PMH significant for CAD, non-insulin dependent diabetes mellitis, common bile duct stone s/p extraction via ERCP, hypertension, hyperlipidemia, gout, GERD presented to the ED s/p fall complaining of left foot pain and abdominal pain.  History is obtained from family who reports patient has been exhibiting increased lethargy and confusion for last few days and today tripped and fall on the object in her bedroom.  Upon evaluation in the ED she is influenza +, COVID-, urinanalysis positive for UTI, right foot x-ray showed nondisplaced fracture of the third and fourth metatarsal right foot.  Started on Tamiflu and Keflex and IV hydration.  Patient's right foot was placed in surgical boot.  Orthopedics was consulted,  recommended outpatient orthopedic follow-up.  Patient reports pain is better controlled.  Assessment & Plan:   Principal Problem:   Acute metabolic encephalopathy Active Problems:   Essential hypertension   Coronary artery disease involving native coronary artery of native heart without angina pectoris   Mixed hyperlipidemia due to type 2 diabetes mellitus (HCC)   GERD without esophagitis   Influenza A   Acute cystitis without hematuria   Closed nondisplaced fracture of metatarsal bone of right foot, initial encounter   Acute metabolic encephalopathy: > Improved Patient presented with several days history of new onset confusion and lethargy. She appeared clinically volume depleted with evidence of UTI as well as influenza infection. Continued IV hydration, volume status improved. Continue IV ceftriaxone and Tamiflu. Mental status significantly improved.  Back to baseline. Continue Tamiflu and ceftriaxone.  Influenza A+: Patient reports nonproductive cough with shortness of breath. Continue supportive care Continue  Tamiflu.  Acute cystitis without hematuria: UA consistent with UTI, bladder scan no evidence of urinary retention. Continue IV ceftriaxone.  Follow-up urine cultures  Closed non displaced fracture of metatarsal bone right foot: Ortho consulted, patient's foot placed in surgical boot. Ortho recommended outpatient follow-up. Adequate pain control.  Coronary artery disease: Continue home medications.  Patient denies any chest pain.  Essential hypertension: Blood pressure remains controlled,  Continue home medications(Avapro, Imdur, hydralazine as needed)  GERD: Continue Protonix  Hyperlipidemia: Continue Crestor.   DVT prophylaxis: Lovenox. Code Status: Full code. Family Communication:  No family at bed side. Disposition Plan:   Status is: Inpatient  Remains inpatient appropriate because:  Admitted for lethargy and altered mentation secondary to influenza, UTI, right foot fracture s/p fall.   Anticipated discharge home on nursing home depends on PT OT eval.   Consultants:   None  Procedures: None  Antimicrobials:   Anti-infectives (From admission, onward)    Start     Dose/Rate Route Frequency Ordered Stop   09/16/21 0300  cefTRIAXone (ROCEPHIN) 1 g in sodium chloride 0.9 % 100 mL IVPB        1 g 200 mL/hr over 30 Minutes Intravenous Every 24 hours 09/16/21 0204     09/15/21 2215  oseltamivir (TAMIFLU) capsule 75 mg        75 mg Oral  Once 09/15/21 2204 09/15/21 2237   09/15/21 2200  cephALEXin (KEFLEX) capsule 500 mg        500 mg Oral  Once 09/15/21 2151 09/15/21 2238        Subjective: Patient was seen and examined at bedside.  Overnight events noted.   Patient reports feeling better.  She reports pain in the right foot  otherwise doing better.   She denies any shortness of breath, dizziness.  Objective: Vitals:   09/16/21 1941 09/16/21 2308 09/17/21 0337 09/17/21 0928  BP: (!) 99/47 (!) 99/50 (!) 124/58 119/64  Pulse: 66 63 62 70  Resp:   20    Temp: 97.6 F (36.4 C) 97.9 F (36.6 C) 97.7 F (36.5 C)   TempSrc: Oral Oral Oral   SpO2: 100% 100% 97% 99%  Weight:      Height:        Intake/Output Summary (Last 24 hours) at 09/17/2021 1426 Last data filed at 09/17/2021 1000 Gross per 24 hour  Intake 240 ml  Output 400 ml  Net -160 ml   Filed Weights   09/15/21 2137  Weight: 88.9 kg    Examination:  General exam: Appears comfortable, not in any acute distress, deconditioned. Respiratory system: Clear to auscultation. Respiratory effort normal. RR 14 Cardiovascular system: S1 & S2 heard, regular rate and rhythm, no murmur. Gastrointestinal system: Abdomen is+ soft, nontender, nondistended, BS+, Central nervous system: Alert and oriented X 3. No focal neurological deficits. Extremities: No edema, no cyanosis, no clubbing. Skin: No rashes, lesions or ulcers Psychiatry: Judgement and insight appear normal. Mood & affect appropriate.     Data Reviewed: I have personally reviewed following labs and imaging studies  CBC: Recent Labs  Lab 09/15/21 0802 09/16/21 0448 09/17/21 0512  WBC 5.8 4.6 3.4*  NEUTROABS  --  2.7  --   HGB 12.2 9.4* 9.3*  HCT 36.4 28.8* 27.8*  MCV 94.1 94.7 93.3  PLT 150 103* 786*   Basic Metabolic Panel: Recent Labs  Lab 09/15/21 0802 09/16/21 0406 09/17/21 0512  NA 134* 137 138  K 4.0 3.4* 3.7  CL 102 105 107  CO2 22 22 21*  GLUCOSE 105* 97 93  BUN 22 29* 21  CREATININE 1.48* 1.52* 1.16*  CALCIUM 10.0 8.3* 9.0  MG  --  2.0 2.0  PHOS  --   --  2.9   GFR: Estimated Creatinine Clearance: 51.4 mL/min (A) (by C-G formula based on SCr of 1.16 mg/dL (H)). Liver Function Tests: Recent Labs  Lab 09/15/21 2241 09/16/21 0406 09/17/21 0512  AST 52* 38 35  ALT 26 20 19   ALKPHOS 43 33* 33*  BILITOT 0.7 0.4 0.6  PROT 7.2 5.3* 5.4*  ALBUMIN 4.3 3.0* 3.0*   No results for input(s): LIPASE, AMYLASE in the last 168 hours. No results for input(s): AMMONIA in the last 168  hours. Coagulation Profile: No results for input(s): INR, PROTIME in the last 168 hours. Cardiac Enzymes: No results for input(s): CKTOTAL, CKMB, CKMBINDEX, TROPONINI in the last 168 hours. BNP (last 3 results) No results for input(s): PROBNP in the last 8760 hours. HbA1C: Recent Labs    09/16/21 0406  HGBA1C 5.4   CBG: Recent Labs  Lab 09/16/21 1202 09/16/21 1637 09/16/21 2014 09/17/21 0741 09/17/21 1142  GLUCAP 92 95 94 86 102*   Lipid Profile: No results for input(s): CHOL, HDL, LDLCALC, TRIG, CHOLHDL, LDLDIRECT in the last 72 hours. Thyroid Function Tests: No results for input(s): TSH, T4TOTAL, FREET4, T3FREE, THYROIDAB in the last 72 hours. Anemia Panel: No results for input(s): VITAMINB12, FOLATE, FERRITIN, TIBC, IRON, RETICCTPCT in the last 72 hours. Sepsis Labs: No results for input(s): PROCALCITON, LATICACIDVEN in the last 168 hours.  Recent Results (from the past 240 hour(s))  Resp Panel by RT-PCR (Flu A&B, Covid) Nasopharyngeal Swab     Status: Abnormal   Collection Time:  09/15/21  7:44 AM   Specimen: Nasopharyngeal Swab; Nasopharyngeal(NP) swabs in vial transport medium  Result Value Ref Range Status   SARS Coronavirus 2 by RT PCR NEGATIVE NEGATIVE Final    Comment: (NOTE) SARS-CoV-2 target nucleic acids are NOT DETECTED.  The SARS-CoV-2 RNA is generally detectable in upper respiratory specimens during the acute phase of infection. The lowest concentration of SARS-CoV-2 viral copies this assay can detect is 138 copies/mL. A negative result does not preclude SARS-Cov-2 infection and should not be used as the sole basis for treatment or other patient management decisions. A negative result may occur with  improper specimen collection/handling, submission of specimen other than nasopharyngeal swab, presence of viral mutation(s) within the areas targeted by this assay, and inadequate number of viral copies(<138 copies/mL). A negative result must be combined  with clinical observations, patient history, and epidemiological information. The expected result is Negative.  Fact Sheet for Patients:  EntrepreneurPulse.com.au  Fact Sheet for Healthcare Providers:  IncredibleEmployment.be  This test is no t yet approved or cleared by the [place] FDA and  has been authorized for detection and/or diagnosis of SARS-CoV-2 by FDA under an Emergency Use Authorization (EUA). This EUA will remain  in effect (meaning this test can be used) for the duration of the COVID-19 declaration under Section 564(b)(1) of the Act, 21 U.S.C.section 360bbb-3(b)(1), unless the authorization is terminated  or revoked sooner.       Influenza A by PCR POSITIVE (A) NEGATIVE Final   Influenza B by PCR NEGATIVE NEGATIVE Final    Comment: (NOTE) The Xpert Xpress SARS-CoV-2/FLU/RSV plus assay is intended as an aid in the diagnosis of influenza from Nasopharyngeal swab specimens and should not be used as a sole basis for treatment. Nasal washings and aspirates are unacceptable for Xpert Xpress SARS-CoV-2/FLU/RSV testing.  Fact Sheet for Patients: EntrepreneurPulse.com.au  Fact Sheet for Healthcare Providers: IncredibleEmployment.be  This test is not yet approved or cleared by the [place] FDA and has been authorized for detection and/or diagnosis of SARS-CoV-2 by FDA under an Emergency Use Authorization (EUA). This EUA will remain in effect (meaning this test can be used) for the duration of the COVID-19 declaration under Section 564(b)(1) of the Act, 21 U.S.C. section 360bbb-3(b)(1), unless the authorization is terminated or revoked.  Performed at [facility], [place] [address] [place], [place] [address]    Radiology Studies: CT ABDOMEN PELVIS W CONTRAST  Result Date: 09/16/2021 CLINICAL DATA:  Weakness and lower abdominal pain. EXAM: CT ABDOMEN AND PELVIS WITH  CONTRAST TECHNIQUE: Multidetector CT imaging of the abdomen and pelvis was performed using the standard protocol following bolus administration of intravenous contrast. CONTRAST:  49mL OMNIPAQUE IOHEXOL 350 MG/ML SOLN COMPARISON:  December 07, 2016 FINDINGS: Lower chest: No acute abnormality. Hepatobiliary: There is diffuse fatty infiltration of the liver parenchyma. No focal liver abnormality is seen. Status post cholecystectomy. No biliary dilatation. Pancreas: Unremarkable. No pancreatic ductal dilatation or surrounding inflammatory changes. Spleen: Normal in size without focal abnormality. Adrenals/Urinary Tract: Adrenal glands are unremarkable. Kidneys are normal in size, without renal calculi or hydronephrosis. A 1.4 cm diameter simple cyst is seen within the lateral aspect of the mid left kidney. Bladder is unremarkable. Stomach/Bowel: Stomach is within normal limits. The appendix is not clearly identified. No evidence of bowel wall thickening, distention, or inflammatory changes. Noninflamed diverticula are seen within the descending and sigmoid colon. Mild thickening of the proximal to mid sigmoid colon is also seen. Vascular/Lymphatic: No significant vascular findings  are present. No enlarged abdominal or pelvic lymph nodes. Reproductive: Status post hysterectomy. No adnexal masses. Other: No abdominal wall hernia or abnormality. No abdominopelvic ascites. Musculoskeletal: Multilevel degenerative changes are seen throughout the lumbar spine. IMPRESSION: 1. Colonic diverticulosis. 2. Mild thickening of the proximal to mid sigmoid colon which may represent mild colitis. 3. Hepatic steatosis. 4. Evidence of prior cholecystectomy and hysterectomy. Electronically Signed   By: Virgina Norfolk M.D.   On: 09/16/2021 03:11   DG Chest Port 1 View  Result Date: 09/15/2021 CLINICAL DATA:  Cough. EXAM: PORTABLE CHEST 1 VIEW COMPARISON:  Chest x-ray 04/29/2020. FINDINGS: The heart is mildly enlarged, unchanged. There  is no focal lung infiltrate, pleural effusion or pneumothorax identified. No acute fractures are seen. IMPRESSION: 1. No acute cardiopulmonary process. 2. Stable cardiomegaly. Electronically Signed   By: Ronney Asters M.D.   On: 09/15/2021 22:36    Scheduled Meds:  allopurinol  300 mg Oral BID   aspirin EC  81 mg Oral Daily   enoxaparin (LOVENOX) injection  40 mg Subcutaneous Q24H   famotidine  20 mg Oral QHS   fenofibrate  160 mg Oral Daily   insulin aspart  0-15 Units Subcutaneous TID AC & HS   irbesartan  37.5 mg Oral Daily   isosorbide mononitrate  30 mg Oral Daily   pantoprazole  40 mg Oral BID AC   rosuvastatin  40 mg Oral Daily   Continuous Infusions:  cefTRIAXone (ROCEPHIN)  IV Stopped (09/17/21 0245)     LOS: 1 day    Time spent: 45 mins    Attila Mccarthy, MD [facility]   If 7PM-7AM, please contact night-coverage

## 2021-09-18 DIAGNOSIS — G9341 Metabolic encephalopathy: Secondary | ICD-10-CM | POA: Diagnosis not present

## 2021-09-18 LAB — CBC
HCT: 28.6 % — ABNORMAL LOW (ref 36.0–46.0)
Hemoglobin: 9.2 g/dL — ABNORMAL LOW (ref 12.0–15.0)
MCH: 30.9 pg (ref 26.0–34.0)
MCHC: 32.2 g/dL (ref 30.0–36.0)
MCV: 96 fL (ref 80.0–100.0)
Platelets: 110 10*3/uL — ABNORMAL LOW (ref 150–400)
RBC: 2.98 MIL/uL — ABNORMAL LOW (ref 3.87–5.11)
RDW: 13.7 % (ref 11.5–15.5)
WBC: 3.2 10*3/uL — ABNORMAL LOW (ref 4.0–10.5)
nRBC: 0 % (ref 0.0–0.2)

## 2021-09-18 LAB — BASIC METABOLIC PANEL
Anion gap: 9 (ref 5–15)
BUN: 16 mg/dL (ref 8–23)
CO2: 24 mmol/L (ref 22–32)
Calcium: 8.8 mg/dL — ABNORMAL LOW (ref 8.9–10.3)
Chloride: 106 mmol/L (ref 98–111)
Creatinine, Ser: 0.97 mg/dL (ref 0.44–1.00)
GFR, Estimated: >60 mL/min (ref >60)
Glucose, Bld: 88 mg/dL (ref 70–99)
Potassium: 3.6 mmol/L (ref 3.5–5.1)
Sodium: 139 mmol/L (ref 135–145)

## 2021-09-18 LAB — GLUCOSE, CAPILLARY
Glucose-Capillary: 84 mg/dL (ref 70–99)
Glucose-Capillary: 97 mg/dL (ref 70–99)
Glucose-Capillary: 100 mg/dL — ABNORMAL HIGH (ref 70–99)
Glucose-Capillary: 123 mg/dL — ABNORMAL HIGH (ref 70–99)

## 2021-09-18 LAB — MAGNESIUM: Magnesium: 1.8 mg/dL (ref 1.7–2.4)

## 2021-09-18 LAB — PHOSPHORUS: Phosphorus: 2.9 mg/dL (ref 2.5–4.6)

## 2021-09-18 MED ORDER — ENSURE ENLIVE PO LIQD
237.0000 mL | Freq: Two times a day (BID) | ORAL | Status: DC
  Administered 2021-09-18 – 2021-09-19 (×2): 237 mL via ORAL

## 2021-09-18 MED ORDER — ADULT MULTIVITAMIN W/MINERALS CH
1.0000 | ORAL_TABLET | Freq: Every day | ORAL | Status: DC
  Administered 2021-09-18 – 2021-09-19 (×2): 1 via ORAL
  Filled 2021-09-18 (×2): qty 1

## 2021-09-18 NOTE — Progress Notes (Signed)
Initial Nutrition Assessment  DOCUMENTATION CODES:   Non-severe (moderate) malnutrition in context of acute illness/injury, Obesity unspecified  INTERVENTION:   -Ensure Enlive po BID, each supplement provides 350 kcal and 20 grams of protein  -Multivitamin with minerals daily  NUTRITION DIAGNOSIS:   Moderate Malnutrition related to acute illness as evidenced by percent weight loss, energy intake < or equal to 75% for > or equal to 1 month, mild fat depletion, moderate muscle depletion.  GOAL:   Patient will meet greater than or equal to 90% of their needs  MONITOR:   PO intake, Supplement acceptance, Labs, Weight trends, I & O's  REASON FOR ASSESSMENT:   Malnutrition Screening Tool    ASSESSMENT:   71 years old female with PMH significant for CAD, non-insulin dependent diabetes melitis, common bile duct stone s/p extraction via ERCP, hypertension, hyperlipidemia, gout, GERD presented to the ED s/p fall complaining of left foot pain and abdominal pain.  History is obtained from family who reports patient has been exhibiting increased lethargy and confusion for last few days and today tripped and fall on the object in her bedroom.  Upon evaluation in the ED her influenza+, COVID-, urinanalysis positive for UTI, right foot x-ray showed nondisplaced fracture of the third and fourth metatarsal right foot.  Patient in room, more alert/oriented today. Husband at bedside. Reports poor appetite that started around 6 months PTA. Pt has steadily eaten less to the point where her husband reports she was eating like a bird. Pt now saying her appetite has been improving. Ate some peaches with a bagel with sausage and eggs today. She is interested in trying Ensure today. States her doctor in December recommended she starting drinking them at home.   Per weight records, pt has lost 20 lbs since 04/28/21 (9% wt loss x 4.5 months, significant for time frame).   Medications: Pepcid  Labs  reviewed:  CBGs: 81-102  NUTRITION - FOCUSED PHYSICAL EXAM:  Flowsheet Row Most Recent Value  Orbital Region Mild depletion  Upper Arm Region Mild depletion  Thoracic and Lumbar Region Mild depletion  Buccal Region Mild depletion  Temple Region No depletion  Clavicle Bone Region Moderate depletion  Clavicle and Acromion Bone Region Moderate depletion  Scapular Bone Region Moderate depletion  Dorsal Hand Mild depletion  Patellar Region Moderate depletion  Anterior Thigh Region Moderate depletion  Posterior Calf Region Mild depletion  Edema (RD Assessment) None  Hair Reviewed  Eyes Reviewed  Mouth Reviewed  Skin Reviewed       Diet Order:   Diet Order             Diet heart healthy/carb modified Room service appropriate? Yes; Fluid consistency: Thin  Diet effective now                   EDUCATION NEEDS:   No education needs have been identified at this time  Skin:  Skin Assessment: Reviewed RN Assessment  Last BM:  1/4 -type 7  Height:   Ht Readings from Last 1 Encounters:  09/16/21 5\' 6"  (1.676 m)    Weight:   Wt Readings from Last 1 Encounters:  09/15/21 88.9 kg    BMI:  Body mass index is 31.64 kg/m.  Estimated Nutritional Needs:   Kcal:  1700-1900  Protein:  70-85g  Fluid:  1.9L/day  Clayton Bibles, MS, RD, LDN Inpatient Clinical Dietitian Contact information available via Amion

## 2021-09-18 NOTE — Progress Notes (Signed)
PROGRESS NOTE    Rachel Black  JSH:[id number] DOB: 03/06/1951 DOA: 09/15/2021  PCP: Lorrene Reid, PA-C   Brief Narrative:  This 71 years old female with PMH significant for CAD, non-insulin dependent diabetes mellitis, common bile duct stone s/p extraction via ERCP, hypertension, hyperlipidemia, gout, GERD presented to the ED s/p fall complaining of left foot pain and abdominal pain.  History is obtained from family who reports patient has been exhibiting increased lethargy and confusion for last few days and today tripped and fall on the object in her bedroom.  Upon evaluation in the ED she is influenza +, COVID-, urinanalysis positive for UTI, right foot x-ray showed nondisplaced fracture of the third and fourth metatarsal right foot.  Started on Tamiflu and Keflex and IV hydration.  Patient's right foot was placed in surgical boot.  Orthopedics was consulted,  recommended outpatient orthopedic follow-up.  Patient reports pain is better controlled.  Assessment & Plan:   Principal Problem:   Acute metabolic encephalopathy Active Problems:   Essential hypertension   Coronary artery disease involving native coronary artery of native heart without angina pectoris   Mixed hyperlipidemia due to type 2 diabetes mellitus (HCC)   GERD without esophagitis   Influenza A   Acute cystitis without hematuria   Closed nondisplaced fracture of metatarsal bone of right foot, initial encounter   Acute metabolic encephalopathy: > Improved Patient presented with several days history of new onset confusion and lethargy. She appeared clinically volume depleted with evidence of UTI as well as influenza infection. Continued IV hydration, volume status improved. Continue IV ceftriaxone and Tamiflu. Mental status significantly improved.  Back to baseline. Continue Tamiflu and ceftriaxone.  Influenza A+: Patient reports nonproductive cough with shortness of breath. Continue supportive care. Continue  Tamiflu.  Acute cystitis without hematuria: UA consistent with UTI, bladder scan no evidence of urinary retention. Continue IV ceftriaxone.  Urine cultures were not sent. Continue ceftriaxone for 5 days.  Closed non displaced fracture of metatarsal bone right foot: Ortho consulted, patient's foot placed in surgical boot. Ortho recommended outpatient follow-up. Adequate pain control.  Coronary artery disease: Continue home medications.  Patient denies any chest pain.  Essential hypertension: Blood pressure remains controlled,  Continue home medications (Avapro, Imdur, hydralazine as needed)  GERD: Continue Protonix  Hyperlipidemia: Continue Crestor.   DVT prophylaxis: Lovenox. Code Status: Full code. Family Communication:  No family at bed side. Disposition Plan:   Status is: Inpatient  Remains inpatient appropriate because:   Admitted for lethargy and altered mentation secondary to influenza, UTI, right foot fracture s/p fall.   Anticipated discharge home depends on PT OT eval.  Anticipated discharge home 09/19/2021.   Consultants:   None  Procedures: None  Antimicrobials:   Anti-infectives (From admission, onward)    Start     Dose/Rate Route Frequency Ordered Stop   09/16/21 0300  cefTRIAXone (ROCEPHIN) 1 g in sodium chloride 0.9 % 100 mL IVPB        1 g 200 mL/hr over 30 Minutes Intravenous Every 24 hours 09/16/21 0204     09/15/21 2215  oseltamivir (TAMIFLU) capsule 75 mg        75 mg Oral  Once 09/15/21 2204 09/15/21 2237   09/15/21 2200  cephALEXin (KEFLEX) capsule 500 mg        500 mg Oral  Once 09/15/21 2151 09/15/21 2238        Subjective: Patient was seen and examined at bedside.  Overnight events noted.   She  reports having pain in her right foot otherwise denies any other concerns. She denies any shortness of breath, dizziness.  Objective: Vitals:   09/17/21 1945 09/18/21 0617 09/18/21 0917 09/18/21 1212  BP: 128/70 121/70 106/71 (!)  91/57  Pulse: 62 63 64 72  Resp: 20 16  20   Temp: 98.2 F (36.8 C) 98.2 F (36.8 C)  97.7 F (36.5 C)  TempSrc: Oral Oral  Oral  SpO2: 99% 97%  100%  Weight:      Height:        Intake/Output Summary (Last 24 hours) at 09/18/2021 1340 Last data filed at 09/18/2021 1300 Gross per 24 hour  Intake 759.76 ml  Output 1 ml  Net 758.76 ml   Filed Weights   09/15/21 2137  Weight: 88.9 kg    Examination:  General exam: Appears comfortable, not in any acute distress.  Deconditioned Respiratory system: Clear to auscultation. Respiratory effort normal. RR 12 Cardiovascular system: S1 & S2 heard, regular rate and rhythm, no murmur. Gastrointestinal system: Abdomen is soft, nontender, nondistended, BS+, Central nervous system: Alert and oriented X 3. No focal neurological deficits. Extremities: No edema, no cyanosis, no clubbing.  Right foot in dressing, tenderness noted. Skin: No rashes, lesions or ulcers Psychiatry: Judgement and insight appear normal. Mood & affect appropriate.     Data Reviewed: I have personally reviewed following labs and imaging studies  CBC: Recent Labs  Lab 09/15/21 0802 09/16/21 0448 09/17/21 0512 09/18/21 0606  WBC 5.8 4.6 3.4* 3.2*  NEUTROABS  --  2.7  --   --   HGB 12.2 9.4* 9.3* 9.2*  HCT 36.4 28.8* 27.8* 28.6*  MCV 94.1 94.7 93.3 96.0  PLT 150 103* 107* 209*   Basic Metabolic Panel: Recent Labs  Lab 09/15/21 0802 09/16/21 0406 09/17/21 0512 09/18/21 0606  NA 134* 137 138 139  K 4.0 3.4* 3.7 3.6  CL 102 105 107 106  CO2 22 22 21* 24  GLUCOSE 105* 97 93 88  BUN 22 29* 21 16  CREATININE 1.48* 1.52* 1.16* 0.97  CALCIUM 10.0 8.3* 9.0 8.8*  MG  --  2.0 2.0 1.8  PHOS  --   --  2.9 2.9   GFR: Estimated Creatinine Clearance: 61.4 mL/min (by C-G formula based on SCr of 0.97 mg/dL). Liver Function Tests: Recent Labs  Lab 09/15/21 2241 09/16/21 0406 09/17/21 0512  AST 52* 38 35  ALT 26 20 19   ALKPHOS 43 33* 33*  BILITOT 0.7 0.4 0.6   PROT 7.2 5.3* 5.4*  ALBUMIN 4.3 3.0* 3.0*   No results for input(s): LIPASE, AMYLASE in the last 168 hours. No results for input(s): AMMONIA in the last 168 hours. Coagulation Profile: No results for input(s): INR, PROTIME in the last 168 hours. Cardiac Enzymes: No results for input(s): CKTOTAL, CKMB, CKMBINDEX, TROPONINI in the last 168 hours. BNP (last 3 results) No results for input(s): PROBNP in the last 8760 hours. HbA1C: Recent Labs    09/16/21 0406  HGBA1C 5.4   CBG: Recent Labs  Lab 09/17/21 1142 09/17/21 1717 09/17/21 2053 09/18/21 0808 09/18/21 1206  GLUCAP 102* 91 81 84 97   Lipid Profile: No results for input(s): CHOL, HDL, LDLCALC, TRIG, CHOLHDL, LDLDIRECT in the last 72 hours. Thyroid Function Tests: No results for input(s): TSH, T4TOTAL, FREET4, T3FREE, THYROIDAB in the last 72 hours. Anemia Panel: No results for input(s): VITAMINB12, FOLATE, FERRITIN, TIBC, IRON, RETICCTPCT in the last 72 hours. Sepsis Labs: No results for input(s): PROCALCITON, LATICACIDVEN  in the last 168 hours.  Recent Results (from the past 240 hour(s))  Resp Panel by RT-PCR (Flu A&B, Covid) Nasopharyngeal Swab     Status: Abnormal   Collection Time: 09/15/21  7:44 AM   Specimen: Nasopharyngeal Swab; Nasopharyngeal(NP) swabs in vial transport medium  Result Value Ref Range Status   SARS Coronavirus 2 by RT PCR NEGATIVE NEGATIVE Final    Comment: (NOTE) SARS-CoV-2 target nucleic acids are NOT DETECTED.  The SARS-CoV-2 RNA is generally detectable in upper respiratory specimens during the acute phase of infection. The lowest concentration of SARS-CoV-2 viral copies this assay can detect is 138 copies/mL. A negative result does not preclude SARS-Cov-2 infection and should not be used as the sole basis for treatment or other patient management decisions. A negative result may occur with  improper specimen collection/handling, submission of specimen other than nasopharyngeal swab,  presence of viral mutation(s) within the areas targeted by this assay, and inadequate number of viral copies(<138 copies/mL). A negative result must be combined with clinical observations, patient history, and epidemiological information. The expected result is Negative.  Fact Sheet for Patients:  EntrepreneurPulse.com.au  Fact Sheet for Healthcare Providers:  IncredibleEmployment.be  This test is no t yet approved or cleared by the [place] FDA and  has been authorized for detection and/or diagnosis of SARS-CoV-2 by FDA under an Emergency Use Authorization (EUA). This EUA will remain  in effect (meaning this test can be used) for the duration of the COVID-19 declaration under Section 564(b)(1) of the Act, 21 U.S.C.section 360bbb-3(b)(1), unless the authorization is terminated  or revoked sooner.       Influenza A by PCR POSITIVE (A) NEGATIVE Final   Influenza B by PCR NEGATIVE NEGATIVE Final    Comment: (NOTE) The Xpert Xpress SARS-CoV-2/FLU/RSV plus assay is intended as an aid in the diagnosis of influenza from Nasopharyngeal swab specimens and should not be used as a sole basis for treatment. Nasal washings and aspirates are unacceptable for Xpert Xpress SARS-CoV-2/FLU/RSV testing.  Fact Sheet for Patients: EntrepreneurPulse.com.au  Fact Sheet for Healthcare Providers: IncredibleEmployment.be  This test is not yet approved or cleared by the [place] FDA and has been authorized for detection and/or diagnosis of SARS-CoV-2 by FDA under an Emergency Use Authorization (EUA). This EUA will remain in effect (meaning this test can be used) for the duration of the COVID-19 declaration under Section 564(b)(1) of the Act, 21 U.S.C. section 360bbb-3(b)(1), unless the authorization is terminated or revoked.  Performed at [facility], [place] [address], [place],  [address]     Radiology Studies: No results found.  Scheduled Meds:  allopurinol  300 mg Oral BID   aspirin EC  81 mg Oral Daily   enoxaparin (LOVENOX) injection  40 mg Subcutaneous Q24H   famotidine  20 mg Oral QHS   feeding supplement  237 mL Oral BID BM   fenofibrate  160 mg Oral Daily   insulin aspart  0-15 Units Subcutaneous TID AC & HS   irbesartan  37.5 mg Oral Daily   isosorbide mononitrate  30 mg Oral Daily   multivitamin with minerals  1 tablet Oral Daily   pantoprazole  40 mg Oral BID AC   rosuvastatin  40 mg Oral Daily   Continuous Infusions:  cefTRIAXone (ROCEPHIN)  IV Stopped (09/18/21 0239)     LOS: 2 days    Time spent: 35 mins    Shyna Duignan, MD [facility]   If 7PM-7AM, please contact night-coverage

## 2021-09-18 NOTE — Evaluation (Signed)
Physical Therapy Evaluation Patient Details Name: Rachel Black MRN: [id number] DOB: [birth year] Today's Date: 09/18/2021  History of Present Illness  Rachel Black is a 71 yo female admitted with acute metabolic enchephalopathy; positive for influenza A. Right foot x-ray revealed nondisplaced fractures of the third and fourth metatarsal right foot; per hospitalist note pt in sugical boot with ortho f/u recommended. PMH: diabetes, HTN, stroke, bil TKA   Clinical Impression  Pt admitted with above diagnosis. Pt from home with spouse, ind with self care and light household chores, ind with household ambulation, and spouse works daily 5-3:30. Pt ambulates with surgical shoe on R foot and RW, different leg length limiting pt's tolerance to activity. Spouse in room reports pt appears normal cognitively and "about normal" with gait. Educated both pt and spouse on time OOB, ambulation to restroom with nursing and in hallway as able and both verbalized agreement. Pt currently with functional limitations due to the deficits listed below (see PT Problem List). Pt will benefit from skilled PT to increase their independence and safety with mobility to allow discharge to the venue listed below.          Recommendations for follow up therapy are one component of a multi-disciplinary discharge planning process, led by the attending physician.  Recommendations may be updated based on patient status, additional functional criteria and insurance authorization.  Follow Up Recommendations No PT follow up    Assistance Recommended at Discharge PRN  Patient can return home with the following  Help with stairs or ramp for entrance    Equipment Recommendations None recommended by PT  Recommendations for Other Services       Functional Status Assessment       Precautions / Restrictions Precautions Precautions: Fall Required Braces or Orthoses: Other Brace Other Brace: Surgical shoe on R foot per  hospitalist note Restrictions Weight Bearing Restrictions: No      Mobility  Bed Mobility Overal bed mobility: Modified Independent  General bed mobility comments: supine<>sit, increased time without cues    Transfers Overall transfer level: Needs assistance Equipment used: Rolling walker (2 wheels) Transfers: Sit to/from Stand Sit to Stand: Min guard  General transfer comment: rocking momentum to power to stand, VC for hand placement, no physical assist    Ambulation/Gait Ambulation/Gait assistance: Min guard Gait Distance (Feet): 40 Feet Assistive device: Rolling walker (2 wheels) Gait Pattern/deviations: Step-through pattern;Decreased stride length Gait velocity: decreased     General Gait Details: surgical shoe on R foot limiting pt tolerance to distance due to LE height difference, step through pattern with RW, good steadiness without LOB and decreased cadence  Stairs            Wheelchair Mobility    Modified Rankin (Stroke Patients Only)       Balance Overall balance assessment: Mild deficits observed, not formally tested (surgical shoe causing LE length difference)       Pertinent Vitals/Pain Pain Assessment: Faces Faces Pain Scale: Hurts a little bit Pain Location: toes (fractured ones) Pain Descriptors / Indicators: Aching Pain Intervention(s): Limited activity within patient's tolerance;Monitored during session    Home Living Family/patient expects to be discharged to:: Private residence Living Arrangements: Spouse/significant other Available Help at Discharge: Family;Available PRN/intermittently Type of Home: House Home Access: Stairs to enter   Entrance Stairs-Number of Steps: 2   Home Layout: One level Home Equipment: Conservation officer, nature (2 wheels) Additional Comments: pt spouse works 5am-3:30pm    Prior Function Prior Level of Function :  Independent/Modified Independent  Mobility Comments: pt reports ind with household ambulation, 1 fall  in last 6 months due to weakness/current status ADLs Comments: pt reports ind with light household chores, self care tasks, spouse drives and does grocery shopping     Hand Dominance   Dominant Hand: Right    Extremity/Trunk Assessment   Upper Extremity Assessment Upper Extremity Assessment: Overall WFL for tasks assessed    Lower Extremity Assessment Lower Extremity Assessment: Overall WFL for tasks assessed;RLE deficits/detail RLE Deficits / Details: ankle AROM WFL, wrapped with surgical shoe on RLE Sensation: WNL RLE Coordination: WNL       Communication   Communication: No difficulties  Cognition Arousal/Alertness: Awake/alert Behavior During Therapy: WFL for tasks assessed/performed Overall Cognitive Status: History of cognitive impairments - at baseline  General Comments: pt oriented to self and being in hospital, unsure of date; spouse reports pt appears at baseline cognitively        General Comments      Exercises     Assessment/Plan    PT Assessment Patient needs continued PT services  PT Problem List Decreased activity tolerance;Decreased balance;Obesity;Pain       PT Treatment Interventions DME instruction;Gait training;Stair training;Functional mobility training;Therapeutic activities;Therapeutic exercise;Balance training;Patient/family education    PT Goals (Current goals can be found in the Care Plan section)  Acute Rehab PT Goals Patient Stated Goal: "I think we are going home tomorrow" PT Goal Formulation: With patient/family Time For Goal Achievement: 10/02/21 Potential to Achieve Goals: Good    Frequency Min 3X/week     Co-evaluation               AM-PAC PT "6 Clicks" Mobility  Outcome Measure Help needed turning from your back to your side while in a flat bed without using bedrails?: None Help needed moving from lying on your back to sitting on the side of a flat bed without using bedrails?: None Help needed moving to and from a  bed to a chair (including a wheelchair)?: A Little Help needed standing up from a chair using your arms (e.g., wheelchair or bedside chair)?: A Little Help needed to walk in hospital room?: A Little Help needed climbing 3-5 steps with a railing? : A Little 6 Click Score: 20    End of Session Equipment Utilized During Treatment: Gait belt Activity Tolerance: Patient tolerated treatment well Patient left: in bed;with call bell/phone within reach;with bed alarm set;with family/visitor present Nurse Communication: Mobility status PT Visit Diagnosis: Unsteadiness on feet (R26.81);Other abnormalities of gait and mobility (R26.89);Difficulty in walking, not elsewhere classified (R26.2)    Time: 2023-3435 PT Time Calculation (min) (ACUTE ONLY): 19 min   Charges:   PT Evaluation $PT Eval Low Complexity: 1 Low           Tori Nahun Kronberg PT, DPT 09/18/21, 12:47 PM

## 2021-09-19 DIAGNOSIS — G9341 Metabolic encephalopathy: Secondary | ICD-10-CM | POA: Diagnosis not present

## 2021-09-19 LAB — CBC
HCT: 31 % — ABNORMAL LOW (ref 36.0–46.0)
Hemoglobin: 10.3 g/dL — ABNORMAL LOW (ref 12.0–15.0)
MCH: 31.4 pg (ref 26.0–34.0)
MCHC: 33.2 g/dL (ref 30.0–36.0)
MCV: 94.5 fL (ref 80.0–100.0)
Platelets: 149 10*3/uL — ABNORMAL LOW (ref 150–400)
RBC: 3.28 MIL/uL — ABNORMAL LOW (ref 3.87–5.11)
RDW: 13.7 % (ref 11.5–15.5)
WBC: 3.9 10*3/uL — ABNORMAL LOW (ref 4.0–10.5)
nRBC: 0 % (ref 0.0–0.2)

## 2021-09-19 LAB — GLUCOSE, CAPILLARY: Glucose-Capillary: 96 mg/dL (ref 70–99)

## 2021-09-19 NOTE — Discharge Summary (Signed)
Physician Discharge Summary  Rachel Black BHA:[id number] DOB: 08-30-1951 DOA: 09/15/2021  PCP: Lorrene Reid, PA-C  Admit date: 09/15/2021  Discharge date: 09/19/2021  Admitted From: Home.  Disposition:  Home.  Recommendations for Outpatient Follow-up:  Follow up with PCP in 1-2 weeks. Please obtain BMP/CBC in one week. Advised to follow-up with orthopedics as scheduled.   Patient has completed treatment for Influenza and UTI.  Home Health:None Equipment/Devices: None  Discharge Condition: Stable CODE STATUS: Full code Diet recommendation: Heart Healthy   Brief Summary / Hospital Course: This 71 years old female with PMH significant for CAD, non-insulin dependent diabetes mellitis, common bile duct stone s/p extraction via ERCP, hypertension, hyperlipidemia, gout, GERD presented to the ED s/p fall complaining of Right foot pain and abdominal pain.  History is obtained from family who reports patient has been exhibiting increased lethargy and confusion for last few days and today tripped and fall on the object in her bedroom.  Upon evaluation in the ED she is influenza +, COVID-, urinanalysis positive for UTI, right foot x-ray showed nondisplaced fracture of the third and fourth metatarsal right foot.  She was started on Tamiflu and Keflex and IV hydration.  Patient's right foot was placed in surgical boot.  Orthopedics was consulted,  recommended outpatient orthopedic follow-up.  Patient was admitted for acute metabolic encephalopathy could be secondary to volume depletion from influenza and UTI.  She was continued on Tamiflu,  ceftriaxone and IV hydration.  Dehydration resolved, mental status significantly improved.  Patient has completed 5 days of Tamiflu, and ceftriaxone.  Patient denies any further UTI symptoms.  Patient has ambulated with physical therapy without any needs.  Patient wants to be discharged.  Patient is being discharged home and she will follow-up with orthopedics in 1  week.  She was managed for below problems  Discharge Diagnoses:  Principal Problem:   Acute metabolic encephalopathy Active Problems:   Essential hypertension   Coronary artery disease involving native coronary artery of native heart without angina pectoris   Mixed hyperlipidemia due to type 2 diabetes mellitus ([place])   GERD without esophagitis   Influenza A   Acute cystitis without hematuria   Closed nondisplaced fracture of metatarsal bone of right foot, initial encounter  Acute metabolic encephalopathy: > Improved Patient presented with several days history of new onset confusion and lethargy. She appeared clinically volume depleted with evidence of UTI as well as influenza infection. Continued IV hydration, volume status improved. Continue IV ceftriaxone and Tamiflu. Mental status significantly improved.  Back to baseline. Completed  Tamiflu and ceftriaxone.     Influenza A+: Patient reports nonproductive cough with shortness of breath. Continue supportive care. Completed Tamiflu.   Acute cystitis without hematuria: UA consistent with UTI, bladder scan no evidence of urinary retention. Continue IV ceftriaxone.  Urine cultures were not sent. Completed 5 days of ceftriaxone.   Closed non displaced fracture of metatarsal bone right foot: Ortho consulted, patient's foot placed in surgical boot. Ortho recommended outpatient follow-up. Adequate pain control.   Coronary artery disease: Continue home medications.  Patient denies any chest pain.   Essential hypertension: Blood pressure remains controlled,  Continue home medications (Avapro, Imdur, hydralazine as needed)   GERD: Continue Protonix   Hyperlipidemia: Continue Crestor.  Discharge Instructions  Discharge Instructions     Call MD for:  difficulty breathing, headache or visual disturbances   Complete by: As directed    Call MD for:  persistant dizziness or light-headedness   Complete by: As  directed     Call MD for:  persistant nausea and vomiting   Complete by: As directed    Diet - low sodium heart healthy   Complete by: As directed    Diet - low sodium heart healthy   Complete by: As directed    Diet Carb Modified   Complete by: As directed    Discharge instructions   Complete by: As directed    Advised to follow-up with primary care physician in 1 week. Advised to follow-up with orthopedics as scheduled.   Patient has completed treatment for Tamiflu and UTI.   Increase activity slowly   Complete by: As directed    Increase activity slowly   Complete by: As directed       Allergies as of 09/19/2021       Reactions   Other Other (See Comments)   SOME BANDAIDS CAUSE SKIN IRRITATION   Penicillins Itching   Has patient had a PCN reaction causing immediate rash, facial/tongue/throat swelling, SOB or lightheadedness with hypotension: No Has patient had a PCN reaction causing severe rash involving mucus membranes or skin necrosis: No Has patient had a PCN reaction that required hospitalization No Has patient had a PCN reaction occurring within the last 10 years: No If all of the above answers are "NO", then may proceed with Cephalosporin use.        Medication List     TAKE these medications    Accu-Chek Aviva Plus test strip Generic drug: glucose blood Use as instructed once a day.  DX Code E11.9   Accu-Chek Softclix Lancets lancets Use as instructed once a day.  DX Code: E11.9   allopurinol 300 MG tablet Commonly known as: ZYLOPRIM Take 1 tablet (300 mg total) by mouth 2 (two) times daily.   aspirin 81 MG EC tablet Take 1 tablet (81 mg total) by mouth daily.   CALCIUM 600-D PO Take 1 tablet by mouth 2 (two) times daily before a meal.   dexlansoprazole 60 MG capsule Commonly known as: Dexilant Take 1 capsule (60 mg total) by mouth 2 (two) times daily.   famotidine 20 MG tablet Commonly known as: PEPCID Take 20 mg by mouth daily.   fenofibrate 160 MG  tablet Take 1 tablet (160 mg total) by mouth daily.   ferrous sulfate 325 (65 FE) MG tablet TAKE 1 TABLET BY MOUTH TWICE A DAY   hyoscyamine 0.125 MG SL tablet Commonly known as: LEVSIN SL PLACE 1-2 TABLETS (0.125-0.25 MG TOTAL) UNDER THE TONGUE EVERY 4 (FOUR) HOURS AS NEEDED. What changed: reasons to take this   isosorbide mononitrate 30 MG 24 hr tablet Commonly known as: IMDUR TAKE 1 TABLET BY MOUTH EVERY DAY   nitroGLYCERIN 0.4 MG SL tablet Commonly known as: NITROSTAT Place 1 tablet (0.4 mg total) under the tongue every 5 (five) minutes x 3 doses as needed for chest pain.   olmesartan 5 MG tablet Commonly known as: BENICAR Take 1 tablet (5 mg total) by mouth daily.   rosuvastatin 40 MG tablet Commonly known as: CRESTOR Take 40 mg by mouth daily.   SitaGLIPtin-MetFORMIN HCl 50-500 MG Tb24 Take 1 tablet by mouth daily.   Udderly Smooth Crea Apply 1 application topically See admin instructions. Apply topically to skin folds as needed for rash/irritation   Vitamin D (Ergocalciferol) 1.25 MG (50000 UNIT) Caps capsule Commonly known as: DRISDOL TAKE 1 CAPSULE BY MOUTH ONE TIME PER WEEK        Follow-up Information  Lorrene Reid, PA-C Follow up in 1 week(s).   Specialty: Physician Assistant Contact information: [place] [address] [phone]         Minus Breeding, MD .   Specialty: Cardiology Contact information: [address] [place] [address] [phone]         Hiram Gash, MD Follow up in 1 week(s).   Specialty: Orthopedic Surgery Contact information: [address] [place] [address] [phone]                Allergies  Allergen Reactions   Other Other (See Comments)    SOME BANDAIDS CAUSE SKIN IRRITATION   Penicillins Itching    Has patient had a PCN reaction causing immediate rash, facial/tongue/throat swelling, SOB or lightheadedness with hypotension:  No Has patient had a PCN reaction causing severe rash involving mucus membranes or skin necrosis: No Has patient had a PCN reaction that required hospitalization No Has patient had a PCN reaction occurring within the last 10 years: No If all of the above answers are "NO", then may proceed with Cephalosporin use.     Consultations: None   Procedures/Studies: CT ABDOMEN PELVIS W CONTRAST  Result Date: 09/16/2021 CLINICAL DATA:  Weakness and lower abdominal pain. EXAM: CT ABDOMEN AND PELVIS WITH CONTRAST TECHNIQUE: Multidetector CT imaging of the abdomen and pelvis was performed using the standard protocol following bolus administration of intravenous contrast. CONTRAST:  41mL OMNIPAQUE IOHEXOL 350 MG/ML SOLN COMPARISON:  December 07, 2016 FINDINGS: Lower chest: No acute abnormality. Hepatobiliary: There is diffuse fatty infiltration of the liver parenchyma. No focal liver abnormality is seen. Status post cholecystectomy. No biliary dilatation. Pancreas: Unremarkable. No pancreatic ductal dilatation or surrounding inflammatory changes. Spleen: Normal in size without focal abnormality. Adrenals/Urinary Tract: Adrenal glands are unremarkable. Kidneys are normal in size, without renal calculi or hydronephrosis. A 1.4 cm diameter simple cyst is seen within the lateral aspect of the mid left kidney. Bladder is unremarkable. Stomach/Bowel: Stomach is within normal limits. The appendix is not clearly identified. No evidence of bowel wall thickening, distention, or inflammatory changes. Noninflamed diverticula are seen within the descending and sigmoid colon. Mild thickening of the proximal to mid sigmoid colon is also seen. Vascular/Lymphatic: No significant vascular findings are present. No enlarged abdominal or pelvic lymph nodes. Reproductive: Status post hysterectomy. No adnexal masses. Other: No abdominal wall hernia or abnormality. No abdominopelvic ascites. Musculoskeletal: Multilevel degenerative changes  are seen throughout the lumbar spine. IMPRESSION: 1. Colonic diverticulosis. 2. Mild thickening of the proximal to mid sigmoid colon which may represent mild colitis. 3. Hepatic steatosis. 4. Evidence of prior cholecystectomy and hysterectomy. Electronically Signed   By: Virgina Norfolk M.D.   On: 09/16/2021 03:11   DG Chest Port 1 View  Result Date: 09/15/2021 CLINICAL DATA:  Cough. EXAM: PORTABLE CHEST 1 VIEW COMPARISON:  Chest x-ray 04/29/2020. FINDINGS: The heart is mildly enlarged, unchanged. There is no focal lung infiltrate, pleural effusion or pneumothorax identified. No acute fractures are seen. IMPRESSION: 1. No acute cardiopulmonary process. 2. Stable cardiomegaly. Electronically Signed   By: Ronney Asters M.D.   On: 09/15/2021 22:36   DG Foot Complete Right  Result Date: 09/15/2021 CLINICAL DATA:  Trauma, pain EXAM: RIGHT FOOT COMPLETE - 3+ VIEW COMPARISON:  None. FINDINGS: There is 4 mm calcific density adjacent to the medial aspect of base of first metatarsal. Deformity is seen in the necks of right third and fourth metatarsals. There  is break in the cortical margins in the neck of third and fourth metatarsals. Osteopenia is seen in bony structures. Hallux valgus deformity is noted. Plantar spur is seen in calcaneus. Degenerative changes are noted in the right ankle joint. IMPRESSION: Recent comminuted, essentially undisplaced fractures are seen in the the necks of right third and fourth metatarsals. There is 4 mm calcific density adjacent to the base of first metatarsal which may suggest recent or old avulsion. Other chronic findings as described in the body of the report. Electronically Signed   By: Elmer Picker M.D.   On: 09/15/2021 08:39      Subjective: Patient was seen and examined at bedside.  Overnight events noted.   Patient reports feeling much improved.  She has ambulated in the hallway without any needs.   Patient wants to be discharged.  Patient is being discharged  home.  Discharge Exam: Vitals:   09/18/21 2031 09/19/21 0504  BP: 107/63 108/68  Pulse: 83 60  Resp: 16 18  Temp: 97.8 F (36.6 C) 98 F (36.7 C)  SpO2: 96% 98%   Vitals:   09/18/21 0917 09/18/21 1212 09/18/21 2031 09/19/21 0504  BP: 106/71 (!) 91/57 107/63 108/68  Pulse: 64 72 83 60  Resp:  20 16 18   Temp:  97.7 F (36.5 C) 97.8 F (36.6 C) 98 F (36.7 C)  TempSrc:  Oral  Oral  SpO2:  100% 96% 98%  Weight:      Height:        General: Pt is alert, awake, not in acute distress Cardiovascular: RRR, S1/S2 +, no rubs, no gallops Respiratory: CTA bilaterally, no wheezing, no rhonchi Abdominal: Soft, NT, ND, bowel sounds + Extremities: no edema, no cyanosis    The results of significant diagnostics from this hospitalization (including imaging, microbiology, ancillary and laboratory) are listed below for reference.     Microbiology: Recent Results (from the past 240 hour(s))  Resp Panel by RT-PCR (Flu A&B, Covid) Nasopharyngeal Swab     Status: Abnormal   Collection Time: 09/15/21  7:44 AM   Specimen: Nasopharyngeal Swab; Nasopharyngeal(NP) swabs in vial transport medium  Result Value Ref Range Status   SARS Coronavirus 2 by RT PCR NEGATIVE NEGATIVE Final    Comment: (NOTE) SARS-CoV-2 target nucleic acids are NOT DETECTED.  The SARS-CoV-2 RNA is generally detectable in upper respiratory specimens during the acute phase of infection. The lowest concentration of SARS-CoV-2 viral copies this assay can detect is 138 copies/mL. A negative result does not preclude SARS-Cov-2 infection and should not be used as the sole basis for treatment or other patient management decisions. A negative result may occur with  improper specimen collection/handling, submission of specimen other than nasopharyngeal swab, presence of viral mutation(s) within the areas targeted by this assay, and inadequate number of viral copies(<138 copies/mL). A negative result must be combined  with clinical observations, patient history, and epidemiological information. The expected result is Negative.  Fact Sheet for Patients:  EntrepreneurPulse.com.au  Fact Sheet for Healthcare Providers:  IncredibleEmployment.be  This test is no t yet approved or cleared by the [place] FDA and  has been authorized for detection and/or diagnosis of SARS-CoV-2 by FDA under an Emergency Use Authorization (EUA). This EUA will remain  in effect (meaning this test can be used) for the duration of the COVID-19 declaration under Section 564(b)(1) of the Act, 21 U.S.C.section 360bbb-3(b)(1), unless the authorization is terminated  or revoked sooner.       Influenza A  by PCR POSITIVE (A) NEGATIVE Final   Influenza B by PCR NEGATIVE NEGATIVE Final    Comment: (NOTE) The Xpert Xpress SARS-CoV-2/FLU/RSV plus assay is intended as an aid in the diagnosis of influenza from Nasopharyngeal swab specimens and should not be used as a sole basis for treatment. Nasal washings and aspirates are unacceptable for Xpert Xpress SARS-CoV-2/FLU/RSV testing.  Fact Sheet for Patients: EntrepreneurPulse.com.au  Fact Sheet for Healthcare Providers: IncredibleEmployment.be  This test is not yet approved or cleared by the [place] FDA and has been authorized for detection and/or diagnosis of SARS-CoV-2 by FDA under an Emergency Use Authorization (EUA). This EUA will remain in effect (meaning this test can be used) for the duration of the COVID-19 declaration under Section 564(b)(1) of the Act, 21 U.S.C. section 360bbb-3(b)(1), unless the authorization is terminated or revoked.  Performed at [facility], [place] [address], [place], [place] [address]      Labs: BNP (last 3 results) No results for input(s): BNP in the last 8760 hours. Basic Metabolic Panel: Recent Labs  Lab 09/15/21 0802 09/16/21 0406  09/17/21 0512 09/18/21 0606  NA 134* 137 138 139  K 4.0 3.4* 3.7 3.6  CL 102 105 107 106  CO2 22 22 21* 24  GLUCOSE 105* 97 93 88  BUN 22 29* 21 16  CREATININE 1.48* 1.52* 1.16* 0.97  CALCIUM 10.0 8.3* 9.0 8.8*  MG  --  2.0 2.0 1.8  PHOS  --   --  2.9 2.9   Liver Function Tests: Recent Labs  Lab 09/15/21 2241 09/16/21 0406 09/17/21 0512  AST 52* 38 35  ALT 26 20 19   ALKPHOS 43 33* 33*  BILITOT 0.7 0.4 0.6  PROT 7.2 5.3* 5.4*  ALBUMIN 4.3 3.0* 3.0*   No results for input(s): LIPASE, AMYLASE in the last 168 hours. No results for input(s): AMMONIA in the last 168 hours. CBC: Recent Labs  Lab 09/15/21 0802 09/16/21 0448 09/17/21 0512 09/18/21 0606 09/19/21 0531  WBC 5.8 4.6 3.4* 3.2* 3.9*  NEUTROABS  --  2.7  --   --   --   HGB 12.2 9.4* 9.3* 9.2* 10.3*  HCT 36.4 28.8* 27.8* 28.6* 31.0*  MCV 94.1 94.7 93.3 96.0 94.5  PLT 150 103* 107* 110* 149*   Cardiac Enzymes: No results for input(s): CKTOTAL, CKMB, CKMBINDEX, TROPONINI in the last 168 hours. BNP: Invalid input(s): POCBNP CBG: Recent Labs  Lab 09/18/21 0808 09/18/21 1206 09/18/21 1705 09/18/21 2039 09/19/21 0738  GLUCAP 84 97 100* 123* 96   D-Dimer No results for input(s): DDIMER in the last 72 hours. Hgb A1c No results for input(s): HGBA1C in the last 72 hours. Lipid Profile No results for input(s): CHOL, HDL, LDLCALC, TRIG, CHOLHDL, LDLDIRECT in the last 72 hours. Thyroid function studies No results for input(s): TSH, T4TOTAL, T3FREE, THYROIDAB in the last 72 hours.  Invalid input(s): FREET3 Anemia work up No results for input(s): VITAMINB12, FOLATE, FERRITIN, TIBC, IRON, RETICCTPCT in the last 72 hours. Urinalysis    Component Value Date/Time   COLORURINE AMBER (A) 09/15/2021 1215   APPEARANCEUR HAZY (A) 09/15/2021 1215   LABSPEC 1.024 09/15/2021 1215   PHURINE 5.0 09/15/2021 1215   GLUCOSEU NEGATIVE 09/15/2021 1215   HGBUR NEGATIVE 09/15/2021 1215   BILIRUBINUR NEGATIVE 09/15/2021 1215    BILIRUBINUR neg 12/09/2015 1701   KETONESUR NEGATIVE 09/15/2021 1215   PROTEINUR NEGATIVE 09/15/2021 1215   UROBILINOGEN 0.2 12/09/2015 1701   UROBILINOGEN 0.2 04/13/2014 1422   NITRITE  POSITIVE (A) 09/15/2021 1215   LEUKOCYTESUR LARGE (A) 09/15/2021 1215   Sepsis Labs Invalid input(s): PROCALCITONIN,  WBC,  LACTICIDVEN Microbiology Recent Results (from the past 240 hour(s))  Resp Panel by RT-PCR (Flu A&B, Covid) Nasopharyngeal Swab     Status: Abnormal   Collection Time: 09/15/21  7:44 AM   Specimen: Nasopharyngeal Swab; Nasopharyngeal(NP) swabs in vial transport medium  Result Value Ref Range Status   SARS Coronavirus 2 by RT PCR NEGATIVE NEGATIVE Final    Comment: (NOTE) SARS-CoV-2 target nucleic acids are NOT DETECTED.  The SARS-CoV-2 RNA is generally detectable in upper respiratory specimens during the acute phase of infection. The lowest concentration of SARS-CoV-2 viral copies this assay can detect is 138 copies/mL. A negative result does not preclude SARS-Cov-2 infection and should not be used as the sole basis for treatment or other patient management decisions. A negative result may occur with  improper specimen collection/handling, submission of specimen other than nasopharyngeal swab, presence of viral mutation(s) within the areas targeted by this assay, and inadequate number of viral copies(<138 copies/mL). A negative result must be combined with clinical observations, patient history, and epidemiological information. The expected result is Negative.  Fact Sheet for Patients:  EntrepreneurPulse.com.au  Fact Sheet for Healthcare Providers:  IncredibleEmployment.be  This test is no t yet approved or cleared by the [place] FDA and  has been authorized for detection and/or diagnosis of SARS-CoV-2 by FDA under an Emergency Use Authorization (EUA). This EUA will remain  in effect (meaning this test can be used) for the  duration of the COVID-19 declaration under Section 564(b)(1) of the Act, 21 U.S.C.section 360bbb-3(b)(1), unless the authorization is terminated  or revoked sooner.       Influenza A by PCR POSITIVE (A) NEGATIVE Final   Influenza B by PCR NEGATIVE NEGATIVE Final    Comment: (NOTE) The Xpert Xpress SARS-CoV-2/FLU/RSV plus assay is intended as an aid in the diagnosis of influenza from Nasopharyngeal swab specimens and should not be used as a sole basis for treatment. Nasal washings and aspirates are unacceptable for Xpert Xpress SARS-CoV-2/FLU/RSV testing.  Fact Sheet for Patients: EntrepreneurPulse.com.au  Fact Sheet for Healthcare Providers: IncredibleEmployment.be  This test is not yet approved or cleared by the [place] FDA and has been authorized for detection and/or diagnosis of SARS-CoV-2 by FDA under an Emergency Use Authorization (EUA). This EUA will remain in effect (meaning this test can be used) for the duration of the COVID-19 declaration under Section 564(b)(1) of the Act, 21 U.S.C. section 360bbb-3(b)(1), unless the authorization is terminated or revoked.  Performed at [facility], [place] [address] [place], [place] [address]      Time coordinating discharge: Over 30 minutes  SIGNED:   Shawna Clamp, MD  [facility] 09/19/2021, 3:34 PM Pager   If 7PM-7AM, please contact night-coverage

## 2021-09-19 NOTE — Discharge Instructions (Signed)
Advised to follow-up with primary care physician in 1 week. Advised to follow-up with orthopedics as scheduled.   Patient has completed treatment for Tamiflu and UTI.

## 2021-09-25 ENCOUNTER — Other Ambulatory Visit: Payer: Self-pay

## 2021-09-25 ENCOUNTER — Ambulatory Visit (INDEPENDENT_AMBULATORY_CARE_PROVIDER_SITE_OTHER): Payer: BC Managed Care – PPO | Admitting: Physician Assistant

## 2021-09-25 ENCOUNTER — Encounter: Payer: Self-pay | Admitting: Physician Assistant

## 2021-09-25 VITALS — BP 113/66 | HR 68 | Temp 98.4°F | Ht 66.0 in | Wt 192.0 lb

## 2021-09-25 DIAGNOSIS — E1159 Type 2 diabetes mellitus with other circulatory complications: Secondary | ICD-10-CM

## 2021-09-25 DIAGNOSIS — E782 Mixed hyperlipidemia: Secondary | ICD-10-CM

## 2021-09-25 DIAGNOSIS — N3000 Acute cystitis without hematuria: Secondary | ICD-10-CM

## 2021-09-25 DIAGNOSIS — S92301A Fracture of unspecified metatarsal bone(s), right foot, initial encounter for closed fracture: Secondary | ICD-10-CM

## 2021-09-25 DIAGNOSIS — I152 Hypertension secondary to endocrine disorders: Secondary | ICD-10-CM

## 2021-09-25 DIAGNOSIS — E1169 Type 2 diabetes mellitus with other specified complication: Secondary | ICD-10-CM | POA: Diagnosis not present

## 2021-09-25 DIAGNOSIS — G9341 Metabolic encephalopathy: Secondary | ICD-10-CM

## 2021-09-25 DIAGNOSIS — Z09 Encounter for follow-up examination after completed treatment for conditions other than malignant neoplasm: Secondary | ICD-10-CM

## 2021-09-25 DIAGNOSIS — J101 Influenza due to other identified influenza virus with other respiratory manifestations: Secondary | ICD-10-CM

## 2021-09-25 DIAGNOSIS — N179 Acute kidney failure, unspecified: Secondary | ICD-10-CM

## 2021-09-25 NOTE — Progress Notes (Signed)
Established Patient Office Visit  Subjective:  Patient ID: Rachel Black, female    DOB: December 26, 1950  Age: 71 y.o. MRN: [id number]  CC:  Chief Complaint  Patient presents with   [facility] Follow Up     HPI Rachel Black presents for hospital follow up. Patient reports feeling better. Ambulating at home without issues, sometimes uses her walker when needed. No new falls. Denies fever, cough, shortness of breath, altered mental status, urinary frequency, urgency or dysuria. Reports see Orthopedist tomorrow.   Discharge Summary: Admit date: 09/15/2021   Discharge date: 09/19/2021   Admitted From: Home.   Disposition:  Home.   Recommendations for Outpatient Follow-up:  Follow up with PCP in 1-2 weeks. Please obtain BMP/CBC in one week. Advised to follow-up with orthopedics as scheduled.   Patient has completed treatment for Influenza and UTI.   Home Health:None Equipment/Devices: None   Discharge Condition: Stable CODE STATUS: Full code Diet recommendation: Heart Healthy    Brief Summary / Hospital Course: This 71 years old female with PMH significant for CAD, non-insulin dependent diabetes mellitis, common bile duct stone s/p extraction via ERCP, hypertension, hyperlipidemia, gout, GERD presented to the ED s/p fall complaining of Right foot pain and abdominal pain.  History is obtained from family who reports patient has been exhibiting increased lethargy and confusion for last few days and today tripped and fall on the object in her bedroom.  Upon evaluation in the ED she is influenza +, COVID-, urinanalysis positive for UTI, right foot x-ray showed nondisplaced fracture of the third and fourth metatarsal right foot.  She was started on Tamiflu and Keflex and IV hydration.  Patient's right foot was placed in surgical boot.  Orthopedics was consulted,  recommended outpatient orthopedic follow-up.  Patient was admitted for acute metabolic encephalopathy could be  secondary to volume depletion from influenza and UTI.  She was continued on Tamiflu,  ceftriaxone and IV hydration.  Dehydration resolved, mental status significantly improved.  Patient has completed 5 days of Tamiflu, and ceftriaxone.  Patient denies any further UTI symptoms.  Patient has ambulated with physical therapy without any needs.  Patient wants to be discharged.  Patient is being discharged home and she will follow-up with orthopedics in 1 week.   She was managed for below problems   Discharge Diagnoses:  Principal Problem:   Acute metabolic encephalopathy Active Problems:   Essential hypertension   Coronary artery disease involving native coronary artery of native heart without angina pectoris   Mixed hyperlipidemia due to type 2 diabetes mellitus ([place])   GERD without esophagitis   Influenza A   Acute cystitis without hematuria   Closed nondisplaced fracture of metatarsal bone of right foot, initial encounter   Acute metabolic encephalopathy: > Improved Patient presented with several days history of new onset confusion and lethargy. She appeared clinically volume depleted with evidence of UTI as well as influenza infection. Continued IV hydration, volume status improved. Continue IV ceftriaxone and Tamiflu. Mental status significantly improved.  Back to baseline. Completed  Tamiflu and ceftriaxone.     Influenza A+: Patient reports nonproductive cough with shortness of breath. Continue supportive care. Completed Tamiflu.   Acute cystitis without hematuria: UA consistent with UTI, bladder scan no evidence of urinary retention. Continue IV ceftriaxone.  Urine cultures were not sent. Completed 5 days of ceftriaxone.   Closed non displaced fracture of metatarsal bone right foot: Ortho consulted, patient's foot placed in surgical boot. Ortho recommended  outpatient follow-up. Adequate pain control.   Coronary artery disease: Continue home medications.  Patient denies any  chest pain.   Essential hypertension: Blood pressure remains controlled,  Continue home medications (Avapro, Imdur, hydralazine as needed)   GERD: Continue Protonix   Hyperlipidemia: Continue Crestor.  Past Medical History:  Diagnosis Date   Arthritis    PAIN AND OA BOTH KNEES AND SHOULDERS AND ELBOWS AND WRIST   Blood transfusion without reported diagnosis 2018   after cholecystectomy   Diabetes mellitus    ORAL MEDICATION   GERD (gastroesophageal reflux disease)    Gout    NO RECENT FLARE UPS   H/O: rheumatic fever    AS A CHILD - NO KNOWN HEART MURMUR OR HEART PROBLEMS   Heart attack ([place])    Hyperlipidemia    Hypertension    Stroke ([place]) 03/2017    Past Surgical History:  Procedure Laterality Date   ABDOMINAL HYSTERECTOMY     CESAREAN SECTION     CHOLECYSTECTOMY N/A 12/09/2016   Procedure: LAPAROSCOPIC CHOLECYSTECTOMY WITH INTRAOPERATIVE CHOLANGIOGRAM;  Surgeon: Stark Klein, MD;  Location: [facility];  Service: General;  Laterality: N/A;   COLONOSCOPY WITH PROPOFOL N/A 01/30/2013   Procedure: COLONOSCOPY WITH PROPOFOL;  Surgeon: Irene Shipper, MD;  Location: [facility];  Service: Endoscopy;  Laterality: N/A;   ERCP N/A 12/08/2016   Procedure: ENDOSCOPIC RETROGRADE CHOLANGIOPANCREATOGRAPHY (ERCP);  Surgeon: Ladene Artist, MD;  Location: [facility];  Service: Endoscopy;  Laterality: N/A;   ESOPHAGOGASTRODUODENOSCOPY (EGD) WITH PROPOFOL N/A 01/30/2013   Procedure: ESOPHAGOGASTRODUODENOSCOPY (EGD) WITH PROPOFOL;  Surgeon: Irene Shipper, MD;  Location: [facility];  Service: Endoscopy;  Laterality: N/A;   LEFT HEART CATH AND CORONARY ANGIOGRAPHY N/A 10/18/2017   Procedure: LEFT HEART CATH AND CORONARY ANGIOGRAPHY;  Surgeon: Martinique, Peter M, MD;  Location: [facility];  Service: Cardiovascular;  Laterality: N/A;   LOOP RECORDER INSERTION N/A 03/18/2017   Procedure: Loop Recorder Insertion;  Surgeon: Constance Haw, MD;  Location: [facility];  Service:  Cardiovascular;  Laterality: N/A;   PARTIAL HYSTERECTOMY     TEE WITHOUT CARDIOVERSION N/A 03/16/2017   Procedure: TRANSESOPHAGEAL ECHOCARDIOGRAM (TEE);  Surgeon: Acie Fredrickson Wonda Cheng, MD;  Location: [facility] END[facility];  Service: Cardiovascular;  Laterality: N/A;   TOTAL KNEE ARTHROPLASTY Right 04/20/2014   Procedure: RIGHT TOTAL KNEE ARTHROPLASTY CONVERTED TO RIGHT KNEE REIMPLANTATION;  Surgeon: Mcarthur Rossetti, MD;  Location: [facility];  Service: Orthopedics;  Laterality: Right;   TOTAL KNEE ARTHROPLASTY Left 08/23/2015   Procedure: LEFT TOTAL KNEE ARTHROPLASTY;  Surgeon: Mcarthur Rossetti, MD;  Location: [facility];  Service: Orthopedics;  Laterality: Left;   VENTRAL HERNIA REPAIR     x2    Family History  Problem Relation Age of Onset   Diabetes Mother    Hypertension Mother        entire family   Breast cancer Mother        diagnosed in her 54's   Stroke Father        CVA   Hyperlipidemia Father        entire family   Diabetes Father    Heart disease Father        No details.  Not at an early age.     Crohn's disease Son    Irritable bowel syndrome Son     Social History   Socioeconomic History   Marital status: Married    Spouse name: [name]   Number of children: 2  Years of education: Not on file   Highest education level: Not on file  Occupational History   Occupation: housewife    Employer: UNEMPLOYED  Tobacco Use   Smoking status: Never   Smokeless tobacco: Never  Substance and Sexual Activity   Alcohol use: No   Drug use: No   Sexual activity: Yes    Partners: Male  Other Topics Concern   Not on file  Social History Narrative   No exercise secondary to knee pain   Social Determinants of Health   Financial Resource Strain: Not on file  Food Insecurity: Not on file  Transportation Needs: Not on file  Physical Activity: Not on file  Stress: Not on file  Social Connections: Not on file  Intimate Partner Violence: Not on file    Outpatient Medications  Prior to Visit  Medication Sig Dispense Refill   ACCU-CHEK SOFTCLIX LANCETS lancets Use as instructed once a day.  DX Code: E11.9 100 each 1   allopurinol (ZYLOPRIM) 300 MG tablet Take 1 tablet (300 mg total) by mouth 2 (two) times daily. 180 tablet 1   aspirin 81 MG EC tablet Take 1 tablet (81 mg total) by mouth daily.     Calcium Carb-Cholecalciferol (CALCIUM 600-D PO) Take 1 tablet by mouth 2 (two) times daily before a meal.     dexlansoprazole (DEXILANT) 60 MG capsule Take 1 capsule (60 mg total) by mouth 2 (two) times daily. 180 capsule 4   Emollient (UDDERLY SMOOTH) CREA Apply 1 application topically See admin instructions. Apply topically to skin folds as needed for rash/irritation     famotidine (PEPCID) 20 MG tablet Take 20 mg by mouth daily.     fenofibrate 160 MG tablet Take 1 tablet (160 mg total) by mouth daily. 90 tablet 1   ferrous sulfate 325 (65 FE) MG tablet TAKE 1 TABLET BY MOUTH TWICE A DAY 180 tablet 0   glucose blood (ACCU-CHEK AVIVA PLUS) test strip Use as instructed once a day.  DX Code E11.9 100 each 1   hyoscyamine (LEVSIN SL) 0.125 MG SL tablet PLACE 1-2 TABLETS (0.125-0.25 MG TOTAL) UNDER THE TONGUE EVERY 4 (FOUR) HOURS AS NEEDED. (Patient taking differently: Place 0.125-0.25 mg under the tongue every 4 (four) hours as needed for cramping.) 60 tablet 3   isosorbide mononitrate (IMDUR) 30 MG 24 hr tablet TAKE 1 TABLET BY MOUTH EVERY DAY 90 tablet 4   nitroGLYCERIN (NITROSTAT) 0.4 MG SL tablet Place 1 tablet (0.4 mg total) under the tongue every 5 (five) minutes x 3 doses as needed for chest pain. 25 tablet 2   olmesartan (BENICAR) 5 MG tablet Take 1 tablet (5 mg total) by mouth daily. 90 tablet 0   rosuvastatin (CRESTOR) 40 MG tablet Take 40 mg by mouth daily.     SitaGLIPtin-MetFORMIN HCl 50-500 MG TB24 Take 1 tablet by mouth daily. 90 tablet 1   Vitamin D, Ergocalciferol, (DRISDOL) 1.25 MG (50000 UNIT) CAPS capsule TAKE 1 CAPSULE BY MOUTH ONE TIME PER WEEK 12 capsule 0    No facility-administered medications prior to visit.    Allergies  Allergen Reactions   Other Other (See Comments)    SOME BANDAIDS CAUSE SKIN IRRITATION   Penicillins Itching    Has patient had a PCN reaction causing immediate rash, facial/tongue/throat swelling, SOB or lightheadedness with hypotension: No Has patient had a PCN reaction causing severe rash involving mucus membranes or skin necrosis: No Has patient had a PCN reaction that required hospitalization No  Has patient had a PCN reaction occurring within the last 10 years: No If all of the above answers are "NO", then may proceed with Cephalosporin use.     ROS Review of Systems Review of Systems:  A fourteen system review of systems was performed and found to be positive as per HPI.   Objective:    Physical Exam General: Well Developed, well nourished, in no acute distress, surgical foot (right) Neuro:  Alert and oriented,  extra-ocular muscles intact  HEENT:  Normocephalic, atraumatic, neck supple Skin:  no gross rash, warm, pink. Cardiac:  RRR, S1 S2 wnl's Respiratory: CTA B/L  Vascular:  Ext warm, no cyanosis apprec.; cap RF less 2 sec. Psych:  No HI/SI, judgement and insight good, Euthymic mood. Full Affect.  BP 113/66    Pulse 68    Temp 98.4 F (36.9 C)    Ht _0  (1.676 m)    Wt 192 lb (87.1 kg)    SpO2 100%    BMI 30.99 kg/m  Wt Readings from Last 3 Encounters:  09/25/21 192 lb (87.1 kg)  09/15/21 196 lb (88.9 kg)  08/26/21 196 lb 9.6 oz (89.2 kg)     Health Maintenance Due  Topic Date Due   COVID-19 Vaccine (1) Never done   COLONOSCOPY (Pts 45-60yr Insurance coverage will need to be confirmed)  01/30/2014   Pneumonia Vaccine 70 Years old (3 - PPSV23 if available, else PCV20) 09/21/2018   MAMMOGRAM  01/05/2019    There are no preventive care reminders to display for this patient.  Lab Results  Component Value Date   TSH 2.040 08/26/2021   Lab Results  Component Value Date   WBC 3.9  (L) 09/19/2021   HGB 10.3 (L) 09/19/2021   HCT 31.0 (L) 09/19/2021   MCV 94.5 09/19/2021   PLT 149 (L) 09/19/2021   Lab Results  Component Value Date   NA 139 09/18/2021   K 3.6 09/18/2021   CO2 24 09/18/2021   GLUCOSE 88 09/18/2021   BUN 16 09/18/2021   CREATININE 0.97 09/18/2021   BILITOT 0.6 09/17/2021   ALKPHOS 33 (L) 09/17/2021   AST 35 09/17/2021   ALT 19 09/17/2021   PROT 5.4 (L) 09/17/2021   ALBUMIN 3.0 (L) 09/17/2021   CALCIUM 8.8 (L) 09/18/2021   ANIONGAP 9 09/18/2021   EGFR 46 (L) 08/26/2021   GFR 39.40 (L) 12/21/2017   Lab Results  Component Value Date   CHOL 119 08/26/2021   Lab Results  Component Value Date   HDL 31 (L) 08/26/2021   Lab Results  Component Value Date   LDLCALC 53 08/26/2021   Lab Results  Component Value Date   TRIG 213 (H) 08/26/2021   Lab Results  Component Value Date   CHOLHDL 3.8 08/26/2021   Lab Results  Component Value Date   HGBA1C 5.4 09/16/2021      Assessment & Plan:   Problem List Items Addressed This Visit       Respiratory   Influenza A     Endocrine   Diabetes mellitus ([place] ([place]ronic)   Relevant Orders   CBC w/Diff   Comp Met (CMET)     Nervous and Auditory   Acute metabolic encephalopathy     Musculoskeletal and Integument   Closed nondisplaced fracture of metatarsal bone of right foot, initial encounter     Genitourinary   AKI (acute kidney injury) ([place]   Acute cystitis without hematuria   Other Visit Diagnoses  Hospital discharge follow-up    -  Primary   Relevant Orders   CBC w/Diff   Comp Met (CMET)   Hypertension associated with diabetes ([place])       Relevant Orders   CBC w/Diff   Comp Met (CMET)   Mixed diabetic hyperlipidemia associated with type 2 diabetes mellitus [facility])          Hospital discharge follow-up, Acute metabolic encephalopathy: -Reviewed hospital notes, labs and imaging. -Scheduled with Orthopedic (Dr. Griffin Basil) 09/26/2020. -Will obtain CBC and CMP. -Patient  denies AMS, urinary or respiratory symptoms.  Closed nondisplaced fracture of metatarsal bone of right foot, initial encounter: -In surgical boot. -Follow up with orthopedic surgery as scheduled. -Continue to use walker as needed. -Foot x-ray: IMPRESSION: Recent comminuted, essentially undisplaced fractures are seen in the the necks of right third and fourth metatarsals. There is 4 mm calcific density adjacent to the base of first metatarsal which may suggest recent or old avulsion.  Diabetes mellitus: -A1c 5.4 (09/16/2021), stable. -Continue current medication regimen. -Will continue to monitor.  Hypertension associated with diabetes: -BP stable, pulse elevated on intake and improved after repeated twice (70 bmp, 68 bpm). -Continue current medication regimen. -Will continue to monitor.  Hyperlipidemia associated with diabetes: -Last lipid panel, LDL 53 -Continue current medication regimen. -Will continue to monitor.  AKI: -Resolved. -09/18/2021: Cr 0.97, eGFR >60. -Will repeat renal function and electrolytes.  Influenza A: -Resolved.  -Completed Tamiflu.  Acute cystitis without hematuria: -Resolved. -Patient completed ceftriaxone therapy.    No orders of the defined types were placed in this encounter.   Follow-up: Return in about 3 months (around 12/24/2021) for DM, HLD, HTN .    Lorrene Reid, PA-C

## 2021-09-25 NOTE — Patient Instructions (Signed)

## 2021-09-26 LAB — CBC WITH DIFFERENTIAL/PLATELET
Basophils Absolute: 0 10*3/uL (ref 0.0–0.2)
Basos: 0 %
EOS (ABSOLUTE): 0 10*3/uL (ref 0.0–0.4)
Eos: 0 %
Hematocrit: 34.1 % (ref 34.0–46.6)
Hemoglobin: 11.7 g/dL (ref 11.1–15.9)
Immature Grans (Abs): 0.1 10*3/uL (ref 0.0–0.1)
Immature Granulocytes: 2 %
Lymphocytes Absolute: 2 10*3/uL (ref 0.7–3.1)
Lymphs: 32 %
MCH: 30.8 pg (ref 26.6–33.0)
MCHC: 34.3 g/dL (ref 31.5–35.7)
MCV: 90 fL (ref 79–97)
Monocytes Absolute: 0.3 10*3/uL (ref 0.1–0.9)
Monocytes: 5 %
Neutrophils Absolute: 3.9 10*3/uL (ref 1.4–7.0)
Neutrophils: 61 %
Platelets: 273 10*3/uL (ref 150–450)
RBC: 3.8 x10E6/uL (ref 3.77–5.28)
RDW: 13.1 % (ref 11.7–15.4)
WBC: 6.2 10*3/uL (ref 3.4–10.8)

## 2021-09-26 LAB — COMPREHENSIVE METABOLIC PANEL
ALT: 12 IU/L (ref 0–32)
AST: 21 IU/L (ref 0–40)
Albumin/Globulin Ratio: 2 (ref 1.2–2.2)
Albumin: 4.2 g/dL (ref 3.8–4.8)
Alkaline Phosphatase: 60 IU/L (ref 44–121)
BUN/Creatinine Ratio: 10 — ABNORMAL LOW (ref 12–28)
BUN: 11 mg/dL (ref 8–27)
Bilirubin Total: 0.4 mg/dL (ref 0.0–1.2)
CO2: 23 mmol/L (ref 20–29)
Calcium: 10.1 mg/dL (ref 8.7–10.3)
Chloride: 103 mmol/L (ref 96–106)
Creatinine, Ser: 1.07 mg/dL — ABNORMAL HIGH (ref 0.57–1.00)
Globulin, Total: 2.1 g/dL (ref 1.5–4.5)
Glucose: 106 mg/dL — ABNORMAL HIGH (ref 70–99)
Potassium: 5 mmol/L (ref 3.5–5.2)
Sodium: 141 mmol/L (ref 134–144)
Total Protein: 6.3 g/dL (ref 6.0–8.5)
eGFR: 56 mL/min/{1.73_m2} — ABNORMAL LOW (ref >59)

## 2021-09-30 ENCOUNTER — Ambulatory Visit (AMBULATORY_SURGERY_CENTER): Payer: BC Managed Care – PPO

## 2021-09-30 VITALS — Ht 66.0 in | Wt 195.0 lb

## 2021-09-30 DIAGNOSIS — Z86010 Personal history of colonic polyps: Secondary | ICD-10-CM

## 2021-09-30 MED ORDER — NA SULFATE-K SULFATE-MG SULF 17.5-3.13-1.6 GM/177ML PO SOLN: 1.0000 | Freq: Once | ORAL | 0 refills | Status: DC

## 2021-09-30 MED ORDER — NA SULFATE-K SULFATE-MG SULF 17.5-3.13-1.6 GM/177ML PO SOLN: 1.0000 | Freq: Once | ORAL | 0 refills | Status: AC

## 2021-09-30 NOTE — Progress Notes (Signed)
No egg or soy allergy known to patient  No issues known to pt with past sedation with any surgeries or procedures Patient denies ever being told they had issues or difficulty with intubation  No FH of Malignant Hyperthermia Pt is not on diet pills Pt is not on  home 02  Pt is not on blood thinners  Pt denies issues with constipation  No A fib or A flutter  Pt had hospitalization in early Jan. For UTI and confusion, saw her PCP 09/25/21 and states she is doing well   NO PA's for preps discussed with pt In PV today  Discussed with pt there will be an out-of-pocket cost for prep and that varies from $0 to 70 +  dollars - pt verbalized understanding   Due to the COVID-19 pandemic we are asking patients to follow certain guidelines in PV and the [place]   Pt aware of COVID protocols and [facility] guidelines   PV completed over the phone. Pt verified name, DOB, address and insurance during PV today.  Pt mailed instruction packet with copy of consent form to read and not return, and instructions.  Pt encouraged to call with questions or issues.  If pt has My chart, procedure instructions sent via My Chart

## 2021-10-07 ENCOUNTER — Telehealth: Payer: Self-pay

## 2021-10-07 NOTE — Telephone Encounter (Signed)
Transition Care Management Unsuccessful Follow-up Telephone Call  Date of discharge and from where:  09/19/21 from [facility]  Attempts:  1st Attempt  Reason for unsuccessful TCM follow-up call:  Unable to leave message   Thea Silversmith, RN, MSN, BSN, [place] Care Management Coordinator [phone]

## 2021-10-08 ENCOUNTER — Telehealth: Payer: Self-pay | Admitting: *Deleted

## 2021-10-08 NOTE — Telephone Encounter (Signed)
Transition Care Management Follow-up Telephone Call Date of discharge and from where: [facility] 09/19/21 How have you been since you were released from the hospital? "Doing better, getting there" Any questions or concerns? No  Items Reviewed: Did the pt receive and understand the discharge instructions provided? Yes  Medications obtained and verified? Yes  Other? No  Any new allergies since your discharge? No  Dietary orders reviewed? Yes Do you have support at home? Yes   Home Care and Equipment/Supplies: Were home health services ordered? no If so, what is the name of the agency? N/a  Has the agency set up a time to come to the patient's home? not applicable Were any new equipment or medical supplies ordered?  No What is the name of the medical supply agency? N/a Were you able to get the supplies/equipment? not applicable Do you have any questions related to the use of the equipment or supplies? N/a  Functional Questionnaire: (I = Independent and D = Dependent) ADLs: I  Bathing/Dressing- I  Meal Prep- I  Eating- I  Maintaining continence- I  Transferring/Ambulation- I  Managing Meds- I  Follow up appointments reviewed:  PCP Hospital f/u appt confirmed? Yes  Saw primary care provider on 09/25/21 Specialist Hospital f/u appt confirmed? No   Are transportation arrangements needed? No  If their condition worsens, is the pt aware to call PCP or go to the Emergency Dept.? Yes Was the patient provided with contact information for the PCP's office or ED? Yes Was to pt encouraged to call back with questions or concerns? Yes  Jacqlyn Larsen [facility], BSN RN Case Manager [phone]

## 2021-10-13 ENCOUNTER — Ambulatory Visit (AMBULATORY_SURGERY_CENTER): Payer: BC Managed Care – PPO | Admitting: Internal Medicine

## 2021-10-13 ENCOUNTER — Encounter: Payer: Self-pay | Admitting: Internal Medicine

## 2021-10-13 VITALS — BP 113/70 | HR 68 | Temp 96.6°F | Resp 13 | Ht 66.0 in | Wt 195.0 lb

## 2021-10-13 DIAGNOSIS — D124 Benign neoplasm of descending colon: Secondary | ICD-10-CM

## 2021-10-13 DIAGNOSIS — D123 Benign neoplasm of transverse colon: Secondary | ICD-10-CM

## 2021-10-13 DIAGNOSIS — R197 Diarrhea, unspecified: Secondary | ICD-10-CM | POA: Diagnosis not present

## 2021-10-13 DIAGNOSIS — D122 Benign neoplasm of ascending colon: Secondary | ICD-10-CM

## 2021-10-13 DIAGNOSIS — Z86010 Personal history of colonic polyps: Secondary | ICD-10-CM

## 2021-10-13 DIAGNOSIS — K5730 Diverticulosis of large intestine without perforation or abscess without bleeding: Secondary | ICD-10-CM | POA: Diagnosis not present

## 2021-10-13 DIAGNOSIS — D126 Benign neoplasm of colon, unspecified: Secondary | ICD-10-CM

## 2021-10-13 MED ORDER — SODIUM CHLORIDE 0.9 % IV SOLN: 500.0000 mL | Freq: Once | INTRAVENOUS | Status: DC

## 2021-10-13 NOTE — Patient Instructions (Signed)
Handout on polyps given. Clip card given.    YOU HAD AN ENDOSCOPIC PROCEDURE TODAY AT [facility]:   Refer to the procedure report that was given to you for any specific questions about what was found during the examination.  If the procedure report does not answer your questions, please call your gastroenterologist to clarify.  If you requested that your care partner not be given the details of your procedure findings, then the procedure report has been included in a sealed envelope for you to review at your convenience later.  YOU SHOULD EXPECT: Some feelings of bloating in the abdomen. Passage of more gas than usual.  Walking can help get rid of the air that was put into your GI tract during the procedure and reduce the bloating. If you had a lower endoscopy (such as a colonoscopy or flexible sigmoidoscopy) you may notice spotting of blood in your stool or on the toilet paper. If you underwent a bowel prep for your procedure, you may not have a normal bowel movement for a few days.  Please Note:  You might notice some irritation and congestion in your nose or some drainage.  This is from the oxygen used during your procedure.  There is no need for concern and it should clear up in a day or so.  SYMPTOMS TO REPORT IMMEDIATELY:  Following lower endoscopy (colonoscopy or flexible sigmoidoscopy):  Excessive amounts of blood in the stool  Significant tenderness or worsening of abdominal pains  Swelling of the abdomen that is new, acute  Fever of 100F or higher   For urgent or emergent issues, a gastroenterologist can be reached at any hour by calling [phone]. Do not use MyChart messaging for urgent concerns.    DIET:  We do recommend a small meal at first, but then you may proceed to your regular diet.  Drink plenty of fluids but you should avoid alcoholic beverages for 24 hours.  ACTIVITY:  You should plan to take it easy for the rest of today and you should NOT DRIVE  or use heavy machinery until tomorrow (because of the sedation medicines used during the test).    FOLLOW UP: Our staff will call the number listed on your records 48-72 hours following your procedure to check on you and address any questions or concerns that you may have regarding the information given to you following your procedure. If we do not reach you, we will leave a message.  We will attempt to reach you two times.  During this call, we will ask if you have developed any symptoms of COVID 19. If you develop any symptoms (ie: fever, flu-like symptoms, shortness of breath, cough etc.) before then, please call [phone].  If you test positive for Covid 19 in the 2 weeks post procedure, please call and report this information to [place].    If any biopsies were taken you will be contacted by phone or by letter within the next 1-3 weeks.  Please call us at [phone] if you have not heard about the biopsies in 3 weeks.    SIGNATURES/CONFIDENTIALITY: You and/or your care partner have signed paperwork which will be entered into your electronic medical record.  These signatures attest to the fact that that the information above on your After Visit Summary has been reviewed and is understood.  Full responsibility of the confidentiality of this discharge information lies with you and/or your care-partner.

## 2021-10-13 NOTE — Progress Notes (Signed)
HISTORY OF PRESENT ILLNESS:  Rachel Black is a 71 y.o. female who presents today for surveillance colonoscopy.  She has intermittent problems with loose stools.  No other complaints  REVIEW OF SYSTEMS:  All non-GI ROS negative. Past Medical History:  Diagnosis Date   Arthritis    PAIN AND OA BOTH KNEES AND SHOULDERS AND ELBOWS AND WRIST   Blood transfusion without reported diagnosis 2018   after cholecystectomy   Broken foot, right, closed, initial encounter 09/15/2021   no surgery needed, fell at home and went to ED   Diabetes mellitus    ORAL MEDICATION   GERD (gastroesophageal reflux disease)    Gout    NO RECENT FLARE UPS   H/O: rheumatic fever    AS A CHILD - NO KNOWN HEART MURMUR OR HEART PROBLEMS   Heart attack ([place])    Hyperlipidemia    Hypertension    Stroke ([place]) 03/2017    Past Surgical History:  Procedure Laterality Date   ABDOMINAL HYSTERECTOMY     CESAREAN SECTION     CHOLECYSTECTOMY N/A 12/09/2016   Procedure: LAPAROSCOPIC CHOLECYSTECTOMY WITH INTRAOPERATIVE CHOLANGIOGRAM;  Surgeon: Stark Klein, MD;  Location: [facility];  Service: General;  Laterality: N/A;   COLONOSCOPY     COLONOSCOPY WITH PROPOFOL N/A 01/30/2013   Procedure: COLONOSCOPY WITH PROPOFOL;  Surgeon: Irene Shipper, MD;  Location: [facility];  Service: Endoscopy;  Laterality: N/A;   ERCP N/A 12/08/2016   Procedure: ENDOSCOPIC RETROGRADE CHOLANGIOPANCREATOGRAPHY (ERCP);  Surgeon: Ladene Artist, MD;  Location: [facility];  Service: Endoscopy;  Laterality: N/A;   ESOPHAGOGASTRODUODENOSCOPY (EGD) WITH PROPOFOL N/A 01/30/2013   Procedure: ESOPHAGOGASTRODUODENOSCOPY (EGD) WITH PROPOFOL;  Surgeon: Irene Shipper, MD;  Location: [facility];  Service: Endoscopy;  Laterality: N/A;   LEFT HEART CATH AND CORONARY ANGIOGRAPHY N/A 10/18/2017   Procedure: LEFT HEART CATH AND CORONARY ANGIOGRAPHY;  Surgeon: Martinique, Peter M, MD;  Location: [facility];  Service: Cardiovascular;  Laterality: N/A;    LOOP RECORDER INSERTION N/A 03/18/2017   Procedure: Loop Recorder Insertion;  Surgeon: Constance Haw, MD;  Location: [facility];  Service: Cardiovascular;  Laterality: N/A;   PARTIAL HYSTERECTOMY     TEE WITHOUT CARDIOVERSION N/A 03/16/2017   Procedure: TRANSESOPHAGEAL ECHOCARDIOGRAM (TEE);  Surgeon: Acie Fredrickson Wonda Cheng, MD;  Location: [facility];  Service: Cardiovascular;  Laterality: N/A;   TOTAL KNEE ARTHROPLASTY Right 04/20/2014   Procedure: RIGHT TOTAL KNEE ARTHROPLASTY CONVERTED TO RIGHT KNEE REIMPLANTATION;  Surgeon: Mcarthur Rossetti, MD;  Location: [facility];  Service: Orthopedics;  Laterality: Right;   TOTAL KNEE ARTHROPLASTY Left 08/23/2015   Procedure: LEFT TOTAL KNEE ARTHROPLASTY;  Surgeon: Mcarthur Rossetti, MD;  Location: [facility];  Service: Orthopedics;  Laterality: Left;   VENTRAL HERNIA REPAIR     x2    Social History Rachel Black  reports that she has never smoked. She has never used smokeless tobacco. She reports that she does not currently use alcohol. She reports that she does not use drugs.  family history includes Breast cancer in her mother; Crohn's disease in her son; Diabetes in her father and mother; Heart disease in her father; Hyperlipidemia in her father; Hypertension in her mother; Irritable bowel syndrome in her son; Stroke in her father.  Allergies  Allergen Reactions   Other Other (See Comments)    SOME BANDAIDS CAUSE SKIN IRRITATION   Penicillins Itching    Has patient had a PCN reaction causing immediate rash, facial/tongue/throat swelling,  SOB or lightheadedness with hypotension: No Has patient had a PCN reaction causing severe rash involving mucus membranes or skin necrosis: No Has patient had a PCN reaction that required hospitalization No Has patient had a PCN reaction occurring within the last 10 years: No If all of the above answers are "NO", then may proceed with Cephalosporin use.        PHYSICAL EXAMINATION:  Vital  signs: BP (!) 96/58    Pulse 98    Temp (!) 96.6 F (35.9 C) (Temporal)    Ht 5\' 6"  (1.676 m)    Wt 195 lb (88.5 kg)    SpO2 98%    BMI 31.47 kg/m  General: Well-developed, well-nourished, no acute distress HEENT: Sclerae are anicteric, conjunctiva pink. Oral mucosa intact Lungs: Clear Heart: Regular Abdomen: soft, nontender, nondistended, no obvious ascites, no peritoneal signs, normal bowel sounds. No organomegaly. Extremities: No edema Psychiatric: alert and oriented x3. Cooperative     ASSESSMENT:  1.  History of adenomatous colon polyps   PLAN:   1.  Surveillance colonoscopy

## 2021-10-13 NOTE — Progress Notes (Signed)
Sedate, gd SR, tolerated procedure well, VSS, report to RN 

## 2021-10-13 NOTE — Progress Notes (Signed)
Pt's states no medical or surgical changes since previsit or office visit.    Right foot broken and in ACE wrap.  VS DT

## 2021-10-13 NOTE — Progress Notes (Signed)
Called to room to assist during endoscopic procedure.  Patient ID and intended procedure confirmed with present staff. Received instructions for my participation in the procedure from the performing physician.  

## 2021-10-13 NOTE — Op Note (Signed)
[place] Patient Name: Rachel Black Procedure Date: 10/13/2021 4:18 PM MRN: [id number] Endoscopist: Docia Chuck. Henrene Pastor , MD Age: 71 Referring MD:  Date of Birth: [birth year] Gender: Female Account #: [id number] Procedure:                Colonoscopy with cold snare polypectomy x 4;                            biopsies; control bleeding (clip) Indications:              High risk colon cancer surveillance: Personal                            history of multiple (3 or more) adenomas,                            Incidental diarrhea noted (intermittent, associated                            with abdominal cramping and urgency). Previous                            exams 2004, 2014 Medicines:                Monitored Anesthesia Care Procedure:                Pre-Anesthesia Assessment:                           - Prior to the procedure, a History and Physical                            was performed, and patient medications and                            allergies were reviewed. The patient's tolerance of                            previous anesthesia was also reviewed. The risks                            and benefits of the procedure and the sedation                            options and risks were discussed with the patient.                            All questions were answered, and informed consent                            was obtained. Prior Anticoagulants: The patient has                            taken no previous anticoagulant or antiplatelet  agents. ASA Grade Assessment: II - A patient with                            mild systemic disease. After reviewing the risks                            and benefits, the patient was deemed in                            satisfactory condition to undergo the procedure.                           After obtaining informed consent, the colonoscope                            was passed under direct vision.  Throughout the                            procedure, the patient's blood pressure, pulse, and                            oxygen saturations were monitored continuously. The                            Colonoscope was introduced through the anus and                            advanced to the the cecum, identified by                            appendiceal orifice and ileocecal valve. The                            ileocecal valve, appendiceal orifice, and rectum                            were photographed. The quality of the bowel                            preparation was excellent. The colonoscopy was                            performed without difficulty. The patient tolerated                            the procedure well. The bowel preparation used was                            SUPREP via split dose instruction. Findings:                 Four polyps were found in the descending colon,                            transverse colon and  ascending colon. The polyps                            were 4 to 6 mm in size. These polyps were removed                            with a cold snare. Resection and retrieval were                            complete. The transverse colon polyp had                            significant and persistent post polypectomy oozing.                            Hemostasis was achieved with 1 Endo Clip. See images                           Multiple diverticula were found in the left colon.                           The exam was otherwise without abnormality on                            direct and retroflexion views. Random colon                            biopsies were taken to rule out microscopic colitis Complications:            No immediate complications. Estimated blood loss:                            None. Estimated Blood Loss:     Estimated blood loss: none. Impression:               - Four 4 to 6 mm polyps in the descending colon, in                             the transverse colon and in the ascending colon,                            removed with a cold snare. Resected and retrieved.                           - Diverticulosis in the left colon.                           - The examination was otherwise normal on direct                            and retroflexion views. Recommendation:           - Repeat colonoscopy in 3 years for surveillance.                           -  Patient has a contact number available for                            emergencies. The signs and symptoms of potential                            delayed complications were discussed with the                            patient. Return to normal activities tomorrow.                            Written discharge instructions were provided to the                            patient.                           - Resume previous diet.                           - Continue present medications.                           - Await pathology results.                           - Please make a follow-up office appointment with                            Dr. Henrene Pastor to further evaluate your abdominal                            complaints Shalie Schremp N. Henrene Pastor, MD 10/13/2021 4:30:17 PM This report has been signed electronically.

## 2021-10-15 ENCOUNTER — Telehealth: Payer: Self-pay

## 2021-10-15 NOTE — Telephone Encounter (Signed)
°  Follow up Call-  Call back number 10/13/2021  Post procedure Call Back phone  # [phone]  Permission to leave phone message Yes  Some recent data might be hidden     Patient questions:  Do you have a fever, pain , or abdominal swelling? No. Pain Score  0 *  Have you tolerated food without any problems? Yes.    Have you been able to return to your normal activities? Yes.    Do you have any questions about your discharge instructions: Diet   No. Medications  No. Follow up visit  No.  Do you have questions or concerns about your Care? No.  Actions: * If pain score is 4 or above: No action needed, pain <4.

## 2021-10-20 ENCOUNTER — Encounter: Payer: Self-pay | Admitting: Internal Medicine

## 2021-10-23 ENCOUNTER — Other Ambulatory Visit: Payer: Self-pay | Admitting: Physician Assistant

## 2021-10-23 DIAGNOSIS — E559 Vitamin D deficiency, unspecified: Secondary | ICD-10-CM

## 2021-10-27 ENCOUNTER — Other Ambulatory Visit: Payer: Self-pay

## 2021-10-27 ENCOUNTER — Ambulatory Visit: Payer: BC Managed Care – PPO | Admitting: Podiatry

## 2021-10-27 DIAGNOSIS — Q828 Other specified congenital malformations of skin: Secondary | ICD-10-CM | POA: Diagnosis not present

## 2021-10-27 DIAGNOSIS — B351 Tinea unguium: Secondary | ICD-10-CM | POA: Diagnosis not present

## 2021-10-27 DIAGNOSIS — E1169 Type 2 diabetes mellitus with other specified complication: Secondary | ICD-10-CM | POA: Diagnosis not present

## 2021-10-27 DIAGNOSIS — L84 Corns and callosities: Secondary | ICD-10-CM | POA: Diagnosis not present

## 2021-10-27 DIAGNOSIS — M79674 Pain in right toe(s): Secondary | ICD-10-CM

## 2021-10-27 DIAGNOSIS — M79675 Pain in left toe(s): Secondary | ICD-10-CM | POA: Diagnosis not present

## 2021-11-02 ENCOUNTER — Encounter: Payer: Self-pay | Admitting: Podiatry

## 2021-11-02 NOTE — Progress Notes (Signed)
Subjective:  Patient ID: Rachel Black, female    DOB: [birth year],  MRN: [id number]  71 y.o. female presents at risk foot care. Patient has history of diabetes, painful porokeratotic lesion(s) right foot and painful mycotic toenails that limit ambulation. Painful toenails interfere with ambulation. Aggravating factors include wearing enclosed shoe gear. Pain is relieved with periodic professional debridement. Painful porokeratotic lesions are aggravated when weightbearing with and without shoegear. Pain is relieved with periodic professional debridement. She also has preulcerative lesion(s) plantar aspect of left foot.  Patient did not check blood glucose today.  Her husband is present during today's visit.  New problem(s): Patient states she fractured her right 5th metatarsal.  She was seen by [facility]oped[facility] at [facility]. She has been cleared to wear regular shoes.   PCP is Lorrene Reid, PA-C , and last visit was 09/25/2021.  Allergies  Allergen Reactions   Other Other (See Comments)    SOME BANDAIDS CAUSE SKIN IRRITATION   Penicillins Itching    Has patient had a PCN reaction causing immediate rash, facial/tongue/throat swelling, SOB or lightheadedness with hypotension: No Has patient had a PCN reaction causing severe rash involving mucus membranes or skin necrosis: No Has patient had a PCN reaction that required hospitalization No Has patient had a PCN reaction occurring within the last 10 years: No If all of the above answers are "NO", then may proceed with Cephalosporin use.     Review of Systems: Negative except as noted in the HPI.   Objective:  Vascular Examination: Capillary refill time to digits immediate b/l. Palpable pedal pulses b/l LE. Pedal hair sparse. No pain with calf compression b/l. Lower extremity skin temperature gradient within normal limits. No edema noted b/l LE. No cyanosis or clubbing noted b/l LE.  Neurological Examination: Protective sensation  intact 5/5 intact bilaterally with 10g monofilament b/l.  Dermatological Examination: Pedal integument with normal turgor, texture and tone BLE. No open wounds b/l LE. No interdigital macerations noted b/l LE. Toenails 1-5 b/l elongated, discolored, dystrophic, thickened, crumbly with subungual debris and tenderness to dorsal palpation. Porokeratotic lesion(s) submet head 1 right foot. No erythema, no edema, no drainage, no fluctuance. Preulcerative lesion noted submet head 1 left foot. There is visible subdermal hemorrhage. There is no surrounding erythema, no edema, no drainage, no odor, no fluctuance.  Musculoskeletal Examination: Muscle strength 5/5 to all lower extremity muscle groups bilaterally. HAV with bunion deformity noted b/l LE. Pes planovalgus deformity noted b/l lower extremities.  Radiographs: None  Last A1c:   Hemoglobin A1C Latest Ref Rng & Units 09/16/2021 08/26/2021 04/28/2021  HGBA1C 4.8 - 5.6 % 5.4 5.2 5.7(A)  Some recent data might be hidden   Assessment:   1. Pain due to onychomycosis of toenails of both feet   2. Pre-ulcerative calluses   3. Porokeratosis   4. Type 2 diabetes mellitus with other specified complication, without long-term current use of insulin ([place])    Plan:  -Continue diabetic foot care principles: inspect feet daily, monitor glucose as recommended by PCP and/or Endocrinologist, and follow prescribed diet per PCP, Endocrinologist and/or dietician. -Mycotic toenails 1-5 bilaterally were debrided in length and girth with sterile nail nippers and dremel without incident. -Preulcerative lesion pared submet head 1 left foot. Total number pared=1. -Painful porokeratotic lesion(s) submet head 1 right foot pared and enucleated with sterile scalpel blade without incident. Total number of lesions debrided=1. -Patient/POA to call should there be question/concern in the interim.  Return in about 3 months (around 01/24/2022).  Marzetta Board, DPM

## 2021-11-12 ENCOUNTER — Ambulatory Visit: Payer: BC Managed Care – PPO | Admitting: Internal Medicine

## 2021-11-21 ENCOUNTER — Other Ambulatory Visit: Payer: Self-pay | Admitting: Gastroenterology

## 2021-11-21 DIAGNOSIS — K317 Polyp of stomach and duodenum: Secondary | ICD-10-CM

## 2021-12-24 ENCOUNTER — Encounter: Payer: Self-pay | Admitting: Physician Assistant

## 2021-12-24 ENCOUNTER — Ambulatory Visit: Payer: BC Managed Care – PPO | Admitting: Physician Assistant

## 2021-12-24 VITALS — BP 91/60 | HR 90 | Temp 98.0°F | Ht 66.0 in | Wt 180.0 lb

## 2021-12-24 DIAGNOSIS — E782 Mixed hyperlipidemia: Secondary | ICD-10-CM

## 2021-12-24 DIAGNOSIS — E1169 Type 2 diabetes mellitus with other specified complication: Secondary | ICD-10-CM | POA: Diagnosis not present

## 2021-12-24 DIAGNOSIS — E1159 Type 2 diabetes mellitus with other circulatory complications: Secondary | ICD-10-CM

## 2021-12-24 DIAGNOSIS — I152 Hypertension secondary to endocrine disorders: Secondary | ICD-10-CM

## 2021-12-24 DIAGNOSIS — R630 Anorexia: Secondary | ICD-10-CM

## 2021-12-24 LAB — POCT GLYCOSYLATED HEMOGLOBIN (HGB A1C): Hemoglobin A1C: 5.1 % (ref 4.0–5.6)

## 2021-12-24 NOTE — Progress Notes (Signed)
?Established patient visit ? ? ?Patient: Rachel Black   DOB: June 09, 1951   71 y.o. Female  MRN: [id number] ?Visit Date: 12/24/2021 ? ?Chief Complaint  ?Patient presents with  ? Follow-up  ? Diabetes  ? Hyperlipidemia  ? Hypertension  ? ?Subjective  ?  ?HPI  ?Patient presents for follow-up on diabetes mellitus, hypertension and hyperlipidemia.  ? ?Diabetes: Pt denies increased urination or thirst from baseline. Pt reports medication compliance. Checking glucose at home. FBS average from 117-130s, lowest 90s. Patient reports does not have much of an appetite which has been an ongoing issue. Usually eats one meal per day. States today has only had a peanut butter jelly sandwich. Denies major lifestyle changes or stressful events recently.  ? ?HTN: Pt denies chest pain, palpitations, dizziness, shortness of breath, syncope or lower extremity swelling. Taking medication as directed without side effects. States continue with monitoring her sodium and drinking water. ? ?HLD: Pt taking medication as directed without issues. Patient states not eating much of processed or fatty foods. ? ? ?  12/24/2021  ?  3:08 PM 09/25/2021  ?  2:47 PM 04/28/2021  ?  3:38 PM 11/20/2020  ?  4:26 PM 08/22/2020  ?  3:46 PM  ?Depression screen PHQ 2/9  ?Decreased Interest 1 0 1 2 0  ?Down, Depressed, Hopeless 1 0 0 0 0  ?PHQ - 2 Score 2 0 1 2 0  ?Altered sleeping 1 1 2 3  0  ?Tired, decreased energy 3 1 2 3  0  ?Change in appetite 3 1 1 1  0  ?Feeling bad or failure about yourself  1 1 0 0 0  ?Trouble concentrating 1 1 0 0 0  ?Moving slowly or fidgety/restless 1 1 0 0 0  ?Suicidal thoughts 0 0 0 0 0  ?PHQ-9 Score 12 6 6 9  0  ?Difficult doing work/chores Not difficult at all Not difficult at all Somewhat difficult    ? ? ?  12/24/2021  ?  3:08 PM 09/25/2021  ?  2:47 PM 04/28/2021  ?  3:39 PM 11/27/2019  ?  1:28 PM  ?GAD 7 : Generalized Anxiety Score  ?Nervous, Anxious, on Edge 1 0 0 1  ?Control/stop worrying 1 1 0 0  ?Worry too much - different things 1 0 1  1  ?Trouble relaxing 1 0 0 1  ?Restless 0 0 0 0  ?Easily annoyed or irritable 0 0 1 1  ?Afraid - awful might happen 1 0 1 1  ?Total GAD 7 Score 5 1 3 5   ?Anxiety Difficulty Somewhat difficult Not difficult at all Somewhat difficult Not difficult at all  ? ? ? ? ?Medications: ?Outpatient Medications Prior to Visit  ?Medication Sig  ? ACCU-CHEK SOFTCLIX LANCETS lancets Use as instructed once a day.  DX Code: E11.9  ? allopurinol (ZYLOPRIM) 300 MG tablet Take 1 tablet (300 mg total) by mouth 2 (two) times daily.  ? aspirin 81 MG EC tablet Take 1 tablet (81 mg total) by mouth daily.  ? Calcium Carb-Cholecalciferol (CALCIUM 600-D PO) Take 1 tablet by mouth 2 (two) times daily before a meal.  ? dexlansoprazole (DEXILANT) 60 MG capsule Take 1 capsule (60 mg total) by mouth 2 (two) times daily.  ? Emollient (UDDERLY SMOOTH) CREA Apply 1 application topically See admin instructions. Apply topically to skin folds as needed for rash/irritation  ? famotidine (PEPCID) 20 MG tablet Take 20 mg by mouth daily.  ? fenofibrate 160 MG tablet Take 1 tablet (160  mg total) by mouth daily.  ? ferrous sulfate 325 (65 FE) MG tablet TAKE 1 TABLET BY MOUTH TWICE A DAY  ? glucose blood (ACCU-CHEK AVIVA PLUS) test strip Use as instructed once a day.  DX Code E11.9  ? hyoscyamine (LEVSIN SL) 0.125 MG SL tablet PLACE 1-2 TABLETS (0.125-0.25 MG TOTAL) UNDER THE TONGUE EVERY 4 (FOUR) HOURS AS NEEDED. (Patient taking differently: Place 0.125-0.25 mg under the tongue every 4 (four) hours as needed for cramping.)  ? isosorbide mononitrate (IMDUR) 30 MG 24 hr tablet TAKE 1 TABLET BY MOUTH EVERY DAY  ? nitroGLYCERIN (NITROSTAT) 0.4 MG SL tablet Place 1 tablet (0.4 mg total) under the tongue every 5 (five) minutes x 3 doses as needed for chest pain.  ? olmesartan (BENICAR) 5 MG tablet Take 1 tablet (5 mg total) by mouth daily.  ? rosuvastatin (CRESTOR) 40 MG tablet Take 40 mg by mouth daily.  ? SitaGLIPtin-MetFORMIN HCl 50-500 MG TB24 Take 1 tablet by  mouth daily.  ? Vitamin D, Ergocalciferol, (DRISDOL) 1.25 MG (50000 UNIT) CAPS capsule TAKE 1 CAPSULE BY MOUTH ONE TIME PER WEEK  ? ?No facility-administered medications prior to visit.  ? ? ?Review of Systems ?Review of Systems:  ?A fourteen system review of systems was performed and found to be positive as per HPI. ? ?Last CBC ?Lab Results  ?Component Value Date  ? WBC 6.2 09/25/2021  ? HGB 11.7 09/25/2021  ? HCT 34.1 09/25/2021  ? MCV 90 09/25/2021  ? MCH 30.8 09/25/2021  ? RDW 13.1 09/25/2021  ? PLT 273 09/25/2021  ? ?Last metabolic panel ?Lab Results  ?Component Value Date  ? GLUCOSE 106 (H) 09/25/2021  ? NA 141 09/25/2021  ? K 5.0 09/25/2021  ? CL 103 09/25/2021  ? CO2 23 09/25/2021  ? BUN 11 09/25/2021  ? CREATININE 1.07 (H) 09/25/2021  ? EGFR 56 (L) 09/25/2021  ? CALCIUM 10.1 09/25/2021  ? PHOS 2.9 09/18/2021  ? PROT 6.3 09/25/2021  ? ALBUMIN 4.2 09/25/2021  ? LABGLOB 2.1 09/25/2021  ? AGRATIO 2.0 09/25/2021  ? BILITOT 0.4 09/25/2021  ? ALKPHOS 60 09/25/2021  ? AST 21 09/25/2021  ? ALT 12 09/25/2021  ? ANIONGAP 9 09/18/2021  ? ?Last lipids ?Lab Results  ?Component Value Date  ? CHOL 119 08/26/2021  ? HDL 31 (L) 08/26/2021  ? [place] 53 08/26/2021  ? LDLDIRECT 63 11/27/2019  ? TRIG 213 (H) 08/26/2021  ? CHOLHDL 3.8 08/26/2021  ? ?Last thyroid functions ?Lab Results  ?Component Value Date  ? TSH 2.040 08/26/2021  ? ?Last vitamin D ?Lab Results  ?Component Value Date  ? VD25OH 42.0 09/24/2020  ? ?  ?  Objective  ?  ?BP 91/60   Pulse 90   Temp 98 ?F (36.7 ?C)   Ht 5' 6"  (1.676 m)   Wt 180 lb (81.6 kg)   SpO2 98%   BMI 29.05 kg/m?  ?BP Readings from Last 3 Encounters:  ?12/24/21 91/60  ?10/13/21 113/70  ?09/25/21 113/66  ? ?Wt Readings from Last 3 Encounters:  ?12/24/21 180 lb (81.6 kg)  ?10/13/21 195 lb (88.5 kg)  ?09/30/21 195 lb (88.5 kg)  ? ? ?Physical Exam  ?General:  Pleasant and cooperative, in no acute distress   ?Neuro:  Alert and oriented,  extra-ocular muscles intact  ?HEENT:  Normocephalic,  atraumatic, neck supple  ?Skin:  no gross rash, warm, pink. ?Cardiac:  RRR, S1 S2 ?Respiratory: CTA B/L w/o wheezing, crackles or rales. ?Vascular:  Ext warm,  no cyanosis apprec.; cap RF less 2 sec. No edema. ?Psych:  No HI/SI, judgement and insight good, Euthymic mood. Full Affect. ? ? ?Results for orders placed or performed in visit on 12/24/21  ?POCT glycosylated hemoglobin (Hb A1C)  ?Result Value Ref Range  ? Hemoglobin A1C 5.1 4.0 - 5.6 %  ? HbA1c POC (<> result, manual entry)    ? HbA1c, POC (prediabetic range)    ? HbA1c, POC (controlled diabetic range)    ? ? Assessment & Plan  ?  ? ? ?Problem List Items Addressed This Visit   ? ?  ? Endocrine  ? Diabetes mellitus ([place]) - Primary (Chronic)  ? Relevant Orders  ? POCT glycosylated hemoglobin (Hb A1C) (Completed)  ? ?Other Visit Diagnoses   ? ? Hypertension associated with diabetes ([place])      ? Mixed diabetic hyperlipidemia associated with type 2 diabetes mellitus ([place])      ? Decreased appetite      ? ?  ? ?Diabetes mellitus: ?-A1c continues to be well controlled at 5.1. Patient reports decreased appetite and has lost 12 pounds since last visit so advised to stop Janumet XR. Anorexia can be a side effect with medication. Will reassess A1c and treatment therapy in 3 months. If A1c remains well controlled then will consider discontinuing Janumet XR and manage with diet. Advised to continue ambulatory glucose monitoring and should notify the office if fasting sugar consistently >160 or <80. Discussed improving eating habits such as more frequent smaller meals. ? ?Hypertension associated with diabetes: ?-Soft blood pressure today. Discussed hydration and adequate calorie intake. Continue olmesartan 5 mg. Recommend to monitor BP at home and notify the office if BP consistently <100/60 or >140/90. Will continue to monitor. Last CMP renal function stable, electrolytes normal.  ? ?Mixed diabetic hyperlipidemia associated with type 2 diabetes mellitus: ?-Last lipid  panel: HDL 31, LDL 53 ?-Continue rosuvastatin 40 mg and fenofibrate 160 mg. Will continue to monitor. ?  ?Decreased appetite: ?-Discussed with patient PHQ-9 and GAD-7 scores, denies depressive etiology. Pt is able t

## 2021-12-26 ENCOUNTER — Other Ambulatory Visit: Payer: Self-pay | Admitting: Physician Assistant

## 2021-12-27 ENCOUNTER — Other Ambulatory Visit: Payer: Self-pay | Admitting: Gastroenterology

## 2021-12-29 ENCOUNTER — Other Ambulatory Visit: Payer: Self-pay | Admitting: Physician Assistant

## 2022-01-12 ENCOUNTER — Other Ambulatory Visit: Payer: Self-pay | Admitting: Physician Assistant

## 2022-01-12 ENCOUNTER — Telehealth: Payer: Self-pay | Admitting: Internal Medicine

## 2022-01-12 NOTE — Telephone Encounter (Signed)
Inbound call from patients husband stating that the pharmacy stated that her insurance does not cover Dexilant anymore and patient has not been able to get refill. Patients requesting a call back to discuss what they need to do to get medication. Please advise.  ?

## 2022-01-13 NOTE — Telephone Encounter (Signed)
Pantoprazole 33m is covered by her insurance - ok to switch to that? ?

## 2022-01-13 NOTE — Telephone Encounter (Signed)
Patient spouse called to follow up on yesterday's call. Please call patient to let them know when pantoprazole is sent to pharmacy so they can pick it up.  ?

## 2022-01-13 NOTE — Telephone Encounter (Signed)
Okay to switch?  

## 2022-01-14 MED ORDER — PANTOPRAZOLE SODIUM 40 MG PO TBEC: 40.0000 mg | DELAYED_RELEASE_TABLET | Freq: Every day | ORAL | 3 refills | Status: DC

## 2022-01-14 NOTE — Telephone Encounter (Signed)
Spoke with patient and told her I sent twice a day Protonix to pharmacy.  I assured her that if it prompted a prior authorization I would complete it as soon as possible. ?

## 2022-01-27 ENCOUNTER — Ambulatory Visit (INDEPENDENT_AMBULATORY_CARE_PROVIDER_SITE_OTHER): Payer: BC Managed Care – PPO | Admitting: Podiatry

## 2022-01-27 ENCOUNTER — Encounter: Payer: Self-pay | Admitting: Podiatry

## 2022-01-27 DIAGNOSIS — B351 Tinea unguium: Secondary | ICD-10-CM | POA: Diagnosis not present

## 2022-01-27 DIAGNOSIS — M79675 Pain in left toe(s): Secondary | ICD-10-CM

## 2022-01-27 DIAGNOSIS — L84 Corns and callosities: Secondary | ICD-10-CM

## 2022-01-27 DIAGNOSIS — M79674 Pain in right toe(s): Secondary | ICD-10-CM

## 2022-01-27 DIAGNOSIS — E1169 Type 2 diabetes mellitus with other specified complication: Secondary | ICD-10-CM | POA: Diagnosis not present

## 2022-02-01 ENCOUNTER — Other Ambulatory Visit: Payer: Self-pay | Admitting: Gastroenterology

## 2022-02-01 ENCOUNTER — Other Ambulatory Visit: Payer: Self-pay | Admitting: Cardiology

## 2022-02-01 ENCOUNTER — Other Ambulatory Visit: Payer: Self-pay | Admitting: Physician Assistant

## 2022-02-01 DIAGNOSIS — E1169 Type 2 diabetes mellitus with other specified complication: Secondary | ICD-10-CM

## 2022-02-01 DIAGNOSIS — K317 Polyp of stomach and duodenum: Secondary | ICD-10-CM

## 2022-02-01 DIAGNOSIS — E1159 Type 2 diabetes mellitus with other circulatory complications: Secondary | ICD-10-CM

## 2022-02-01 DIAGNOSIS — E559 Vitamin D deficiency, unspecified: Secondary | ICD-10-CM

## 2022-02-03 NOTE — Progress Notes (Signed)
Subjective:  Patient ID: Rachel Black, female    DOB: [birth year],  MRN: [id number]  Rachel Black presents to clinic today for preventative diabetic foot care and callus(es) left lower extremity and painful thick toenails that are difficult to trim. Painful toenails interfere with ambulation. Aggravating factors include wearing enclosed shoe gear. Pain is relieved with periodic professional debridement. Painful calluses are aggravated when weightbearing with and without shoegear. Pain is relieved with periodic professional debridement.  Patient states blood glucose was 110 mg/dl today. Last known HgA1c was unknown.  Her husband is present during today's visit.  New problem(s):  Patient states callus on bottom of left foot is painful. She has not been wearing her diabetic shoes in the house.  PCP is Lorrene Reid, PA-C , and last visit was December 24, 2021.  Allergies  Allergen Reactions   Other Other (See Comments)    SOME BANDAIDS CAUSE SKIN IRRITATION   Penicillins Itching    Has patient had a PCN reaction causing immediate rash, facial/tongue/throat swelling, SOB or lightheadedness with hypotension: No Has patient had a PCN reaction causing severe rash involving mucus membranes or skin necrosis: No Has patient had a PCN reaction that required hospitalization No Has patient had a PCN reaction occurring within the last 10 years: No If all of the above answers are "NO", then may proceed with Cephalosporin use.     Review of Systems: Negative except as noted in the HPI.  Objective: No changes noted in today's physical examination. Vascular Examination: Capillary refill time to digits immediate b/l. Palpable pedal pulses b/l LE. Pedal hair sparse. No pain with calf compression b/l. Lower extremity skin temperature gradient within normal limits. No edema noted b/l LE. No cyanosis or clubbing noted b/l LE.  Neurological Examination: Protective sensation intact 5/5 intact  bilaterally with 10g monofilament b/l.  Dermatological Examination: Pedal integument with normal turgor, texture and tone BLE. No open wounds b/l LE. No interdigital macerations noted b/l LE. Toenails 1-5 b/l elongated, discolored, dystrophic, thickened, crumbly with subungual debris and tenderness to dorsal palpation. Porokeratotic lesion(s) submet head 1 right foot. No erythema, no edema, no drainage, no fluctuance. Preulcerative lesion noted submet head 1 left foot. There is visible subdermal hemorrhage. There is no surrounding erythema, no edema, no drainage, no odor, no fluctuance.  Musculoskeletal Examination: Muscle strength 5/5 to all lower extremity muscle groups bilaterally. HAV with bunion deformity noted b/l LE. Pes planovalgus deformity noted b/l lower extremities.  Radiographs: None    Latest Ref Rng & Units 12/24/2021    3:12 PM 09/16/2021    4:06 AM 08/26/2021   10:49 AM 04/28/2021    3:40 PM  Hemoglobin A1C  Hemoglobin-A1c 4.0 - 5.6 % 5.1   5.4   5.2   5.7     Assessment/Plan: 1. Pain due to onychomycosis of toenails of both feet   2. Pre-ulcerative calluses   3. Type 2 diabetes mellitus with other specified complication, without long-term current use of insulin ([place])     -Patient was evaluated and treated. All patient's and/or POA's questions/concerns answered on today's visit. -Encouraged patient to wear diabetic shoes or slippers with memory foam  when at home.. -Mycotic toenails 1-5 bilaterally were debrided in length and girth with sterile nail nippers and dremel without incident. -Preulcerative lesion pared submet head 1 left foot. Total number pared=1. -Patient/POA to call should there be question/concern in the interim.   Return in about 3 months (around 04/29/2022).  Marzetta Board, DPM

## 2022-02-09 ENCOUNTER — Other Ambulatory Visit: Payer: Self-pay | Admitting: Physician Assistant

## 2022-02-09 DIAGNOSIS — E1151 Type 2 diabetes mellitus with diabetic peripheral angiopathy without gangrene: Secondary | ICD-10-CM

## 2022-02-12 ENCOUNTER — Ambulatory Visit: Payer: BC Managed Care – PPO

## 2022-02-12 ENCOUNTER — Other Ambulatory Visit: Payer: BC Managed Care – PPO

## 2022-02-16 ENCOUNTER — Other Ambulatory Visit: Payer: Self-pay | Admitting: Physician Assistant

## 2022-02-16 DIAGNOSIS — E785 Hyperlipidemia, unspecified: Secondary | ICD-10-CM

## 2022-02-27 ENCOUNTER — Other Ambulatory Visit: Payer: Self-pay

## 2022-02-27 ENCOUNTER — Emergency Department (HOSPITAL_COMMUNITY): Payer: BC Managed Care – PPO

## 2022-02-27 ENCOUNTER — Emergency Department (HOSPITAL_COMMUNITY)
Admission: EM | Admit: 2022-02-27 | Discharge: 2022-02-27 | Disposition: A | Discharge status: Home / Self Care | Payer: BC Managed Care – PPO | Attending: Emergency Medicine | Admitting: Emergency Medicine

## 2022-02-27 ENCOUNTER — Encounter (HOSPITAL_COMMUNITY): Payer: Self-pay

## 2022-02-27 DIAGNOSIS — R112 Nausea with vomiting, unspecified: Secondary | ICD-10-CM | POA: Insufficient documentation

## 2022-02-27 DIAGNOSIS — R42 Dizziness and giddiness: Secondary | ICD-10-CM | POA: Diagnosis not present

## 2022-02-27 DIAGNOSIS — R350 Frequency of micturition: Secondary | ICD-10-CM | POA: Insufficient documentation

## 2022-02-27 DIAGNOSIS — E860 Dehydration: Secondary | ICD-10-CM

## 2022-02-27 DIAGNOSIS — R002 Palpitations: Secondary | ICD-10-CM | POA: Diagnosis present

## 2022-02-27 DIAGNOSIS — R001 Bradycardia, unspecified: Secondary | ICD-10-CM | POA: Diagnosis not present

## 2022-02-27 DIAGNOSIS — I2510 Atherosclerotic heart disease of native coronary artery without angina pectoris: Secondary | ICD-10-CM | POA: Diagnosis not present

## 2022-02-27 DIAGNOSIS — Z7984 Long term (current) use of oral hypoglycemic drugs: Secondary | ICD-10-CM | POA: Insufficient documentation

## 2022-02-27 DIAGNOSIS — E119 Type 2 diabetes mellitus without complications: Secondary | ICD-10-CM | POA: Insufficient documentation

## 2022-02-27 DIAGNOSIS — Z7982 Long term (current) use of aspirin: Secondary | ICD-10-CM | POA: Insufficient documentation

## 2022-02-27 DIAGNOSIS — I10 Essential (primary) hypertension: Secondary | ICD-10-CM | POA: Insufficient documentation

## 2022-02-27 DIAGNOSIS — Z79899 Other long term (current) drug therapy: Secondary | ICD-10-CM | POA: Diagnosis not present

## 2022-02-27 DIAGNOSIS — R1084 Generalized abdominal pain: Secondary | ICD-10-CM | POA: Diagnosis not present

## 2022-02-27 DIAGNOSIS — R197 Diarrhea, unspecified: Secondary | ICD-10-CM | POA: Insufficient documentation

## 2022-02-27 LAB — CBC
HCT: 36.3 % (ref 36.0–46.0)
Hemoglobin: 12 g/dL (ref 12.0–15.0)
MCH: 31.5 pg (ref 26.0–34.0)
MCHC: 33.1 g/dL (ref 30.0–36.0)
MCV: 95.3 fL (ref 80.0–100.0)
Platelets: 190 10*3/uL (ref 150–400)
RBC: 3.81 MIL/uL — ABNORMAL LOW (ref 3.87–5.11)
RDW: 13.4 % (ref 11.5–15.5)
WBC: 5.7 10*3/uL (ref 4.0–10.5)
nRBC: 0 % (ref 0.0–0.2)

## 2022-02-27 LAB — URINALYSIS, ROUTINE W REFLEX MICROSCOPIC
Bilirubin Urine: NEGATIVE
Glucose, UA: NEGATIVE mg/dL
Hgb urine dipstick: NEGATIVE
Ketones, ur: NEGATIVE mg/dL
Leukocytes,Ua: NEGATIVE
Nitrite: NEGATIVE
Protein, ur: NEGATIVE mg/dL
Specific Gravity, Urine: 1.026 (ref 1.005–1.030)
pH: 7 (ref 5.0–8.0)

## 2022-02-27 LAB — COMPREHENSIVE METABOLIC PANEL
ALT: 22 U/L (ref 0–44)
AST: 24 U/L (ref 15–41)
Albumin: 3.7 g/dL (ref 3.5–5.0)
Alkaline Phosphatase: 46 U/L (ref 38–126)
Anion gap: 7 (ref 5–15)
BUN: 15 mg/dL (ref 8–23)
CO2: 25 mmol/L (ref 22–32)
Calcium: 9.6 mg/dL (ref 8.9–10.3)
Chloride: 108 mmol/L (ref 98–111)
Creatinine, Ser: 1.3 mg/dL — ABNORMAL HIGH (ref 0.44–1.00)
GFR, Estimated: 45 mL/min — ABNORMAL LOW (ref >60)
Glucose, Bld: 120 mg/dL — ABNORMAL HIGH (ref 70–99)
Potassium: 4 mmol/L (ref 3.5–5.1)
Sodium: 140 mmol/L (ref 135–145)
Total Bilirubin: 0.7 mg/dL (ref 0.3–1.2)
Total Protein: 5.9 g/dL — ABNORMAL LOW (ref 6.5–8.1)

## 2022-02-27 LAB — TROPONIN I (HIGH SENSITIVITY)
Troponin I (High Sensitivity): 18 ng/L — ABNORMAL HIGH (ref <18)
Troponin I (High Sensitivity): 14 ng/L (ref <18)

## 2022-02-27 LAB — LIPASE, BLOOD: Lipase: 43 U/L (ref 11–51)

## 2022-02-27 LAB — TSH: TSH: 1.073 u[IU]/mL (ref 0.350–4.500)

## 2022-02-27 MED ORDER — IOHEXOL 350 MG/ML SOLN
75.0000 mL | Freq: Once | INTRAVENOUS | Status: AC | PRN
  Administered 2022-02-27: 75 mL via INTRAVENOUS

## 2022-02-27 MED ORDER — LACTATED RINGERS IV BOLUS
1000.0000 mL | Freq: Once | INTRAVENOUS | Status: AC
  Administered 2022-02-27: 1000 mL via INTRAVENOUS

## 2022-02-27 MED ORDER — ONDANSETRON HCL 4 MG PO TABS: 4.0000 mg | ORAL_TABLET | Freq: Three times a day (TID) | ORAL | 0 refills | Status: DC | PRN

## 2022-02-27 MED ORDER — ONDANSETRON HCL 4 MG/2ML IJ SOLN
4.0000 mg | Freq: Once | INTRAMUSCULAR | Status: AC
  Administered 2022-02-27: 4 mg via INTRAVENOUS
  Filled 2022-02-27: qty 2

## 2022-02-27 NOTE — ED Provider Notes (Signed)
3:31 PM Care assumed from Dr. Maryan Rued.  At time of transfer care, patient is awaiting for results of urinalysis, though troponin, and TSH test while she is getting hydrated.  Previous team suspects her symptoms were primarily due to some dehydration related to recent GI symptoms.  If work-up remains reassuring, plan of care be to discharge home with outpatient cardiology follow-up for the palpitations.  5:26 PM Patient's urinalysis did not show UTI.  Patient's second troponin and TSH normal.  Per previous plan, she passed a p.o. challenge will get her ready for discharge with prescription for nausea medicine and cardiology follow-up.  Patient agrees.   5:30 PM Patient discharged in good condition with improvement in symptoms and with understanding return precautions and follow-up instructions.   Clinical Impression: 1. Palpitations   2. Nausea vomiting and diarrhea   3. Dehydration     Disposition: Discharge  Condition: Good  I have discussed the results, Dx and Tx plan with the pt(& family if present). He/she/they expressed understanding and agree(s) with the plan. Discharge instructions discussed at great length. Strict return precautions discussed and pt &/or family have verbalized understanding of the instructions. No further questions at time of discharge.    New Prescriptions   ONDANSETRON (ZOFRAN) 4 MG TABLET    Take 1 tablet (4 mg total) by mouth every 8 (eight) hours as needed for nausea or vomiting.    Follow Up: Lorrene Reid, PA-C [address] [address] [address] [place] [facility] [place] [address] [phone]         Noach Calvillo, Gwenyth Allegra, MD 02/27/22 1730

## 2022-02-27 NOTE — ED Provider Notes (Signed)
[facility] EMERGENCY DEPARTMENT Provider Note   CSN: [id number] Arrival date & time: 02/27/22  1155     History  Chief Complaint  Patient presents with   Palpitations    Rachel Black is a 71 y.o. female.  Pt is a 71y/o female with hx of CAD, non-insulin dependent diabetes mellitis (who was taken off diabetic medication several months ago), common bile duct stone s/p extraction via ERCP, hypertension, hyperlipidemia, GERD who is presenting today with complaints of fluttering in her chest as well as multiple days of vomiting and diarrhea.  She reports she is having approximately 5 episodes of diarrhea per day and had 1-2 episodes of emesis yesterday.  She has had decreased oral intake and reports eating and drinking nothing today.  She denies any chest pain or shortness of breath but reports when she was having the fluttering in her chest it did make her feel a bit lightheaded.  She reports that lasted approximately 1 to 2 hours but has resolved since then.  She has mild diffuse abdominal tenderness which she reports has been an ongoing issue for the last 4 to 5 months since she had some polyps removed during colonoscopy.  She has not followed up with her doctor about this but does continue to take her prescription medications.  She denies over-the-counter medication use.  She does complain of urinary frequency but denies any dysuria.  She does report that she is thirsty all the time.  Patient does not have a history of dysrhythmia.  The history is provided by the patient and medical records.  Palpitations      Home Medications Prior to Admission medications   Medication Sig Start Date End Date Taking? Authorizing Provider  allopurinol (ZYLOPRIM) 300 MG tablet Take 1 tablet (300 mg total) by mouth 2 (two) times daily. 08/26/21  Yes Lorrene Reid, PA-C  aspirin 81 MG EC tablet Take 1 tablet (81 mg total) by mouth daily. 10/18/17  Yes Reino Bellis B, NP  Calcium  Carb-Cholecalciferol (CALCIUM 600-D PO) Take 1 tablet by mouth 2 (two) times daily before a meal.   Yes [provider]  dexlansoprazole (DEXILANT) 60 MG capsule TAKE 1 CAPSULE BY MOUTH 2 TIMES DAILY. Patient taking differently: Take 60 mg by mouth in the morning and at bedtime. 12/29/21  Yes Esterwood, [name] S, PA-C  Emollient (UDDERLY SMOOTH) CREA Apply 1 application topically See admin instructions. Apply topically to skin folds as needed for rash/irritation   Yes [provider]  fenofibrate 160 MG tablet TAKE 1 TABLET BY MOUTH EVERY DAY Patient taking differently: Take 160 mg by mouth daily. 02/16/22  Yes Abonza, Maritza, PA-C  ferrous sulfate 325 (65 FE) MG tablet TAKE 1 TABLET BY MOUTH TWICE A DAY Patient taking differently: Take 325 mg by mouth 2 (two) times daily with a meal. 02/02/22  Yes Zehr, Janett Billow D, PA-C  isosorbide mononitrate (IMDUR) 30 MG 24 hr tablet TAKE 1 TABLET BY MOUTH EVERY DAY Patient taking differently: Take 30 mg by mouth daily. 06/18/21  Yes Hochrein, Jeneen Rinks, MD  olmesartan (BENICAR) 5 MG tablet TAKE 1 TABLET (5 MG TOTAL) BY MOUTH DAILY. 02/02/22  Yes Abonza, Maritza, PA-C  pantoprazole (PROTONIX) 40 MG tablet Take 1 tablet (40 mg total) by mouth daily. 01/14/22  Yes Irene Shipper, MD  rosuvastatin (CRESTOR) 40 MG tablet Take 1 tablet (40 mg total) by mouth daily. PLEASE MAKE APPOINTMENT WITH CARDIOLOGIST FOR FURTHER REFILLS Patient taking differently: Take 40 mg by  mouth daily. 02/03/22  Yes Hochrein, Jeneen Rinks, MD  Vitamin D, Ergocalciferol, (DRISDOL) 1.25 MG (50000 UNIT) CAPS capsule TAKE 1 CAPSULE BY MOUTH ONE TIME PER WEEK Patient taking differently: Take 50,000 Units by mouth every 7 (seven) days. Takes on Monday's 02/02/22  Yes Abonza, Maritza, PA-C  ACCU-CHEK AVIVA PLUS test strip USE AS INSTRUCTED ONCE A DAY. DX CODE E11.9 02/10/22   Lorrene Reid, PA-C  ACCU-CHEK SOFTCLIX LANCETS lancets Use as instructed once a day.  DX Code: E11.9 09/21/17   Carollee Herter,  Alferd Apa, DO  JANUMET XR 50-500 MG TB24 TAKE 1 TABLET BY MOUTH EVERY DAY Patient not taking: Reported on 02/27/2022 12/26/21   Lorrene Reid, PA-C      Allergies    Other and Penicillins    Review of Systems   Review of Systems  Constitutional:  Positive for unexpected weight change.       She reports a significant amount of weight loss in the last year that has been unintentional but does report that she is eating much less than what she used to  Cardiovascular:  Positive for palpitations.    Physical Exam Updated Vital Signs BP (!) 127/59   Pulse (!) 52   Temp 98.2 F (36.8 C) (Oral)   Resp 14   Ht 5' 6"  (1.676 m)   Wt 81.6 kg   SpO2 100%   BMI 29.05 kg/m  Physical Exam Vitals and nursing note reviewed.  Constitutional:      General: She is not in acute distress.    Appearance: She is well-developed.  HENT:     Head: Normocephalic and atraumatic.  Eyes:     Pupils: Pupils are equal, round, and reactive to light.  Cardiovascular:     Rate and Rhythm: Regular rhythm. Bradycardia present.     Heart sounds: Normal heart sounds. No murmur heard.    No friction rub.  Pulmonary:     Effort: Pulmonary effort is normal.     Breath sounds: Normal breath sounds. No wheezing or rales.  Abdominal:     General: Bowel sounds are normal. There is no distension.     Palpations: Abdomen is soft.     Tenderness: There is abdominal tenderness. There is no guarding or rebound.     Comments: Mild diffuse tenderness  Musculoskeletal:        General: No tenderness. Normal range of motion.     Cervical back: Normal range of motion and neck supple.     Right lower leg: No edema.     Left lower leg: No edema.     Comments: No edema  Skin:    General: Skin is warm and dry.     Coloration: Skin is pale.     Findings: No rash.  Neurological:     Mental Status: She is alert and oriented to person, place, and time. Mental status is at baseline.     Cranial Nerves: No cranial nerve  deficit.  Psychiatric:        Mood and Affect: Mood normal.        Behavior: Behavior normal.     ED Results / Procedures / Treatments   Labs (all labs ordered are listed, but only abnormal results are displayed) Labs Reviewed  CBC - Abnormal; Notable for the following components:      Result Value   RBC 3.81 (*)    All other components within normal limits  COMPREHENSIVE METABOLIC PANEL - Abnormal; Notable for the following  components:   Glucose, Bld 120 (*)    Creatinine, Ser 1.30 (*)    Total Protein 5.9 (*)    GFR, Estimated 45 (*)    All other components within normal limits  TROPONIN I (HIGH SENSITIVITY) - Abnormal; Notable for the following components:   Troponin I (High Sensitivity) 18 (*)    All other components within normal limits  LIPASE, BLOOD  URINALYSIS, ROUTINE W REFLEX MICROSCOPIC  TSH  CBG MONITORING, ED  TROPONIN I (HIGH SENSITIVITY)    EKG None  Radiology CT ABDOMEN PELVIS W CONTRAST  Result Date: 02/27/2022 CLINICAL DATA:  Abdominal pain, acute, nonlocalized. EXAM: CT ABDOMEN AND PELVIS WITH CONTRAST TECHNIQUE: Multidetector CT imaging of the abdomen and pelvis was performed using the standard protocol following bolus administration of intravenous contrast. RADIATION DOSE REDUCTION: This exam was performed according to the departmental dose-optimization program which includes automated exposure control, adjustment of the mA and/or kV according to patient size and/or use of iterative reconstruction technique. CONTRAST:  67m OMNIPAQUE IOHEXOL 350 MG/ML SOLN COMPARISON:  CT abdomen and pelvis 09/16/2021 FINDINGS: Lower chest: Lung bases are clear without focal nodule, mass, or airspace disease. Heart size is normal. No significant pleural or pericardial effusion is present. Hepatobiliary: Chronic pneumobilia following ERCP noted. Mild fatty infiltration liver is present. No discrete lesions are present. Cholecystectomy noted. Common bile duct is within normal  limits. Pancreas: Unremarkable. No pancreatic ductal dilatation or surrounding inflammatory changes. Spleen: Normal in size without focal abnormality. Adrenals/Urinary Tract: Adrenal glands are within normal limits bilaterally. Simple cyst of the left kidney cyst is stable. Recommend no follow-up. No other focal lesions are present. No stone or obstruction is present. Ureters are within normal limits. The urinary bladder is normal. Stomach/Bowel: The stomach and duodenum are within normal limits. Small bowel is unremarkable. Terminal ileum is within normal limits. The appendix is visualized and normal. Mild wall thickening and stranding is noted about the ascending and transverse colon. Diverticular changes are present in the descending and sigmoid colon. No inflammatory changes are associated. Vascular/Lymphatic: No significant vascular findings are present. No enlarged abdominal or pelvic lymph nodes. Reproductive: Status post hysterectomy. No adnexal masses. Other: No abdominal wall hernia or abnormality. No abdominopelvic ascites. Musculoskeletal: Degenerative changes are present in the SI joints bilaterally. Degenerative changes are also present the lower lumbar facets. No focal osseous lesions are present. Hips are located and within normal limits bilaterally. IMPRESSION: 1. Mild wall thickening and stranding about the ascending and transverse colon compatible with a nonspecific colitis. 2. Descending and sigmoid diverticulosis without diverticulitis. 3. Chronic pneumobilia following ERCP. 4. Hepatic steatosis. Electronically Signed   By: CSan MorelleM.D.   On: 02/27/2022 14:38   DG Chest 1 View  Result Date: 02/27/2022 CLINICAL DATA:  Lightheadedness and palpitations EXAM: CHEST  1 VIEW COMPARISON:  09/15/2021 FINDINGS: Midline trachea. Normal heart size and mediastinal contours. Mild left hemidiaphragm elevation. No pleural effusion or pneumothorax. Clear lungs. IMPRESSION: No active disease.  Electronically Signed   By: KAbigail MiyamotoM.D.   On: 02/27/2022 12:56    Procedures Procedures    Medications Ordered in ED Medications  lactated ringers bolus 1,000 mL (0 mLs Intravenous Stopped 02/27/22 1535)  ondansetron (ZOFRAN) injection 4 mg (4 mg Intravenous Given 02/27/22 1446)  iohexol (OMNIPAQUE) 350 MG/ML injection 75 mL (75 mLs Intravenous Contrast Given 02/27/22 1416)    ED Course/ Medical Decision Making/ A&P  Medical Decision Making Amount and/or Complexity of Data Reviewed External Data Reviewed: notes.    Details: Prior hospitalizations Labs: ordered. Decision-making details documented in ED Course. Radiology: ordered and independent interpretation performed. Decision-making details documented in ED Course.  Risk Prescription drug management.   Pt with multiple medical problems and comorbidities and presenting today with a complaint that caries a high risk for morbidity and mortality.  Presenting today with complaint of palpitations but also having nausea vomiting and diarrhea.  Patient have some mild diffuse abdominal pain but is awake and alert.  Does not appear to be obtunded or have altered mental status or concerns for metabolic encephalopathy.  Patient was admitted the beginning of the year for UTI and flu with encephalopathy based on review of external medical records from her hospitalization.  She does not have significant cardiac history except for CAD but no history of dysrhythmia.  Patient does not take any anticoagulation.  Upon arrival here patient is bradycardic with a nonspecific EKG and no findings concerning for dysrhythmia but does have occassional PVC's.  She has no significant changes.  I independently interpreted patient's labs today CBC without acute findings today, CMP with mild AKI with creatinine 1.3 from her baseline of 1 with normal BUN and anion gap.  Troponin mildly elevated at 18 without old to compare and lipase within  normal limits.  Unsure cause of patient's palpitations today could be paroxysmal atrial fibrillation or SVT.  Could be related to dehydration.  We will give IV fluids and do CT given patient's recurrent symptoms of nausea vomiting diarrhea and weight loss.  UA is still pending as she does have increased history of UTIs in the past.  Low suspicion for ACS at this time.  We will do a delta troponin.    I have independently visualized and interpreted pt's images today. CT showing air near where the gallbladder had been removed and some stranding around the bowel.  Radiology reports mild wall thickening and stranding about the a sending and transverse colon compatible with a nonspecific colitis.  No evidence of diverticulitis and chronic pneumobilia.  This would explain patient's nausea and vomiting and diarrhea.  She does not have symptoms to suggest C. difficile.  She does not have a white count and do not feel that she needs antibiotics for this but will need follow-up with Dr. Henrene Pastor her gastroenterologist.  Additional labs are still pending.  Patient will need to be p.o. challenged and UA is still pending.  Patient checked out to Dr. Gustavus Messing at 330.         Final Clinical Impression(s) / ED Diagnoses Final diagnoses:  None    Rx / DC Orders ED Discharge Orders     None         Blanchie Dessert, MD 02/27/22 1548

## 2022-02-27 NOTE — Discharge Instructions (Signed)
Your history, exam, and work-up today were overall reassuring.  He did have some evidence of dehydration with increased kidney function.  This is likely due to the nausea, vomiting, and diarrhea recently and the CT showed some evidence of this colonic irritation.  There is no evidence of acute diverticulitis however.  Given your reassuring work-up, we do feel you are safe for discharge home.  Please use the nausea medicine to help maintain hydration and rest and follow-up with cardiology as an outpatient.  They should call you but please call them if they do not.  If any symptoms change or worsen acutely, please return to the nearest emergency department.

## 2022-02-27 NOTE — ED Provider Triage Note (Signed)
Emergency Medicine Provider Triage Evaluation Note  Rachel Black , a 71 y.o. female  was evaluated in triage.  Pt complains of palpitations onset 9 am. Has associated nausea, vomiting, diarrhea.  She notes that there have been people at home around her with similar symptoms.  No meds tried. Denies chest pain, shortness of breath, cough.  Denies anticoagulant use.  Denies past medical history of atrial fibrillation.  Review of Systems  Positive: As per HPI above Negative:   Physical Exam  BP 102/69 (BP Location: Left Arm)   Pulse (!) 59   Temp 98.2 F (36.8 C) (Oral)   Resp 14   Ht 5' 6"  (1.676 m)   Wt 81.6 kg   SpO2 100%   BMI 29.05 kg/m  Gen:   Awake, no distress   Resp:  Normal effort  MSK:   Moves extremities without difficulty  Other:  Mild chest wall tenderness to palpation  Medical Decision Making  Medically screening exam initiated at 12:13 PM.  Appropriate orders placed.  Rachel Black was informed that the remainder of the evaluation will be completed by another provider, this initial triage assessment does not replace that evaluation, and the importance of remaining in the ED until their evaluation is complete.  Work-up initiated.    Kenrick Pore A, PA-C 02/27/22 1221

## 2022-02-27 NOTE — ED Notes (Signed)
Patient Alert and oriented to baseline. Stable and ambulatory to baseline. Patient verbalized understanding of the discharge instructions.  Patient belongings were taken by the patient.   

## 2022-02-27 NOTE — ED Triage Notes (Signed)
Palpitations that started around 9am but has had n/v/d x 4 days.  Denies CP SOB but mild dizziness.

## 2022-02-27 NOTE — ED Notes (Signed)
Pt given water and ginger ale. Able to tolerate without difficulty.

## 2022-03-01 ENCOUNTER — Emergency Department (HOSPITAL_COMMUNITY)
Admission: EM | Admit: 2022-03-01 | Discharge: 2022-03-01 | Disposition: A | Discharge status: Home / Self Care | Payer: BC Managed Care – PPO | Attending: Emergency Medicine | Admitting: Emergency Medicine

## 2022-03-01 ENCOUNTER — Encounter (HOSPITAL_COMMUNITY): Payer: Self-pay

## 2022-03-01 ENCOUNTER — Other Ambulatory Visit: Payer: Self-pay

## 2022-03-01 DIAGNOSIS — R002 Palpitations: Secondary | ICD-10-CM | POA: Diagnosis not present

## 2022-03-01 DIAGNOSIS — Z7982 Long term (current) use of aspirin: Secondary | ICD-10-CM | POA: Insufficient documentation

## 2022-03-01 DIAGNOSIS — E860 Dehydration: Secondary | ICD-10-CM | POA: Diagnosis not present

## 2022-03-01 DIAGNOSIS — R109 Unspecified abdominal pain: Secondary | ICD-10-CM | POA: Diagnosis present

## 2022-03-01 DIAGNOSIS — K529 Noninfective gastroenteritis and colitis, unspecified: Secondary | ICD-10-CM | POA: Insufficient documentation

## 2022-03-01 LAB — URINALYSIS, ROUTINE W REFLEX MICROSCOPIC
Bilirubin Urine: NEGATIVE
Glucose, UA: NEGATIVE mg/dL
Hgb urine dipstick: NEGATIVE
Ketones, ur: NEGATIVE mg/dL
Leukocytes,Ua: NEGATIVE
Nitrite: NEGATIVE
Protein, ur: NEGATIVE mg/dL
Specific Gravity, Urine: 1.013 (ref 1.005–1.030)
pH: 7 (ref 5.0–8.0)

## 2022-03-01 LAB — CBC
HCT: 37.4 % (ref 36.0–46.0)
Hemoglobin: 12.2 g/dL (ref 12.0–15.0)
MCH: 31.1 pg (ref 26.0–34.0)
MCHC: 32.6 g/dL (ref 30.0–36.0)
MCV: 95.4 fL (ref 80.0–100.0)
Platelets: 191 10*3/uL (ref 150–400)
RBC: 3.92 MIL/uL (ref 3.87–5.11)
RDW: 13.5 % (ref 11.5–15.5)
WBC: 5.7 10*3/uL (ref 4.0–10.5)
nRBC: 0 % (ref 0.0–0.2)

## 2022-03-01 LAB — COMPREHENSIVE METABOLIC PANEL
ALT: 22 U/L (ref 0–44)
AST: 24 U/L (ref 15–41)
Albumin: 3.8 g/dL (ref 3.5–5.0)
Alkaline Phosphatase: 47 U/L (ref 38–126)
Anion gap: 10 (ref 5–15)
BUN: 13 mg/dL (ref 8–23)
CO2: 24 mmol/L (ref 22–32)
Calcium: 9.8 mg/dL (ref 8.9–10.3)
Chloride: 104 mmol/L (ref 98–111)
Creatinine, Ser: 1.41 mg/dL — ABNORMAL HIGH (ref 0.44–1.00)
GFR, Estimated: 40 mL/min — ABNORMAL LOW (ref >60)
Glucose, Bld: 109 mg/dL — ABNORMAL HIGH (ref 70–99)
Potassium: 3.8 mmol/L (ref 3.5–5.1)
Sodium: 138 mmol/L (ref 135–145)
Total Bilirubin: 0.7 mg/dL (ref 0.3–1.2)
Total Protein: 5.9 g/dL — ABNORMAL LOW (ref 6.5–8.1)

## 2022-03-01 LAB — TROPONIN I (HIGH SENSITIVITY): Troponin I (High Sensitivity): 8 ng/L (ref <18)

## 2022-03-01 LAB — LIPASE, BLOOD: Lipase: 39 U/L (ref 11–51)

## 2022-03-01 MED ORDER — ONDANSETRON HCL 4 MG/2ML IJ SOLN
4.0000 mg | Freq: Once | INTRAMUSCULAR | Status: AC
  Administered 2022-03-01: 4 mg via INTRAVENOUS
  Filled 2022-03-01: qty 2

## 2022-03-01 MED ORDER — LACTATED RINGERS IV BOLUS
1000.0000 mL | Freq: Once | INTRAVENOUS | Status: AC
  Administered 2022-03-01: 1000 mL via INTRAVENOUS

## 2022-03-01 NOTE — ED Provider Notes (Signed)
[facility] EMERGENCY DEPARTMENT Provider Note   CSN: [id number] Arrival date & time: 03/01/22  1010     History  No chief complaint on file.   Rachel Black is a 71 y.o. female.  HPI Patient presents with abdominal pain and palpitations.  Seen 2 days ago in the ER for the same.  Has had about 3 to 4 days of symptoms.  Felt bad.  States she felt some palpitations.  Nausea and has had some diarrhea.  Somewhat decreased oral intake.  States she took some nausea medicine and is feeling somewhat better now.  Has had chronic GI issues in the past.  States no clear diagnosis has been found.  Reviewing recent note had potential mild colitis on CT scan.  No fevers.  Feels a little lightheaded.    Home Medications Prior to Admission medications   Medication Sig Start Date End Date Taking? Authorizing Provider  ACCU-CHEK AVIVA PLUS test strip USE AS INSTRUCTED ONCE A DAY. DX CODE E11.9 02/10/22   Lorrene Reid, PA-C  ACCU-CHEK SOFTCLIX LANCETS lancets Use as instructed once a day.  DX Code: E11.9 09/21/17   Carollee Herter, Alferd Apa, DO  allopurinol (ZYLOPRIM) 300 MG tablet Take 1 tablet (300 mg total) by mouth 2 (two) times daily. 08/26/21   Lorrene Reid, PA-C  aspirin 81 MG EC tablet Take 1 tablet (81 mg total) by mouth daily. 10/18/17   Cheryln Manly, NP  Calcium Carb-Cholecalciferol (CALCIUM 600-D PO) Take 1 tablet by mouth 2 (two) times daily before a meal.    [provider]  dexlansoprazole (DEXILANT) 60 MG capsule TAKE 1 CAPSULE BY MOUTH 2 TIMES DAILY. Patient taking differently: Take 60 mg by mouth in the morning and at bedtime. 12/29/21   Esterwood, [name] S, PA-C  Emollient (UDDERLY SMOOTH) CREA Apply 1 application topically See admin instructions. Apply topically to skin folds as needed for rash/irritation    [provider]  fenofibrate 160 MG tablet TAKE 1 TABLET BY MOUTH EVERY DAY Patient taking differently: Take 160 mg by mouth daily. 02/16/22    Lorrene Reid, PA-C  ferrous sulfate 325 (65 FE) MG tablet TAKE 1 TABLET BY MOUTH TWICE A DAY Patient taking differently: Take 325 mg by mouth 2 (two) times daily with a meal. 02/02/22   Zehr, Janett Billow D, PA-C  isosorbide mononitrate (IMDUR) 30 MG 24 hr tablet TAKE 1 TABLET BY MOUTH EVERY DAY Patient taking differently: Take 30 mg by mouth daily. 06/18/21   Minus Breeding, MD  JANUMET XR 50-500 MG TB24 TAKE 1 TABLET BY MOUTH EVERY DAY Patient not taking: Reported on 02/27/2022 12/26/21   Lorrene Reid, PA-C  olmesartan (BENICAR) 5 MG tablet TAKE 1 TABLET (5 MG TOTAL) BY MOUTH DAILY. 02/02/22   Lorrene Reid, PA-C  ondansetron (ZOFRAN) 4 MG tablet Take 1 tablet (4 mg total) by mouth every 8 (eight) hours as needed for nausea or vomiting. 02/27/22   Tegeler, Gwenyth Allegra, MD  pantoprazole (PROTONIX) 40 MG tablet Take 1 tablet (40 mg total) by mouth daily. 01/14/22   Irene Shipper, MD  rosuvastatin (CRESTOR) 40 MG tablet Take 1 tablet (40 mg total) by mouth daily. PLEASE MAKE APPOINTMENT WITH CARDIOLOGIST FOR FURTHER REFILLS Patient taking differently: Take 40 mg by mouth daily. 02/03/22   Minus Breeding, MD  Vitamin D, Ergocalciferol, (DRISDOL) 1.25 MG (50000 UNIT) CAPS capsule TAKE 1 CAPSULE BY MOUTH ONE TIME PER WEEK Patient taking differently: Take 50,000 Units by mouth every 7 (  seven) days. Takes on Monday's 02/02/22   Lorrene Reid, PA-C      Allergies    Other and Penicillins    Review of Systems   Review of Systems  Physical Exam Updated Vital Signs BP 115/65   Pulse (!) 50   Temp 98.1 F (36.7 C) (Oral)   Resp 16   SpO2 100%  Physical Exam Vitals and nursing note reviewed.  HENT:     Head: Atraumatic.     Mouth/Throat:     Mouth: Mucous membranes are moist.  Cardiovascular:     Rate and Rhythm: Regular rhythm.  Abdominal:     Tenderness: There is no abdominal tenderness.  Musculoskeletal:        General: No tenderness.  Skin:    General: Skin is warm.     Capillary  Refill: Capillary refill takes less than 2 seconds.  Neurological:     Mental Status: She is alert and oriented to person, place, and time.     ED Results / Procedures / Treatments   Labs (all labs ordered are listed, but only abnormal results are displayed) Labs Reviewed  COMPREHENSIVE METABOLIC PANEL - Abnormal; Notable for the following components:      Result Value   Glucose, Bld 109 (*)    Creatinine, Ser 1.41 (*)    Total Protein 5.9 (*)    GFR, Estimated 40 (*)    All other components within normal limits  LIPASE, BLOOD  CBC  URINALYSIS, ROUTINE W REFLEX MICROSCOPIC  TROPONIN I (HIGH SENSITIVITY)    EKG EKG Interpretation  Date/Time:  Sunday March 01 2022 10:30:30 EDT Ventricular Rate:  80 PR Interval:  172 QRS Duration: 98 QT Interval:  392 QTC Calculation: 452 R Axis:   -71 Text Interpretation: Normal sinus rhythm Left anterior fascicular block Abnormal ECG When compared with ECG of 27-Feb-2022 12:12, No significant change since last tracing Confirmed by Davonna Belling [phone]) on 03/01/2022 12:13:04 PM  Radiology No results found.  Procedures Procedures    Medications Ordered in ED Medications  lactated ringers bolus 1,000 mL (0 mLs Intravenous Stopped 03/01/22 1506)  ondansetron (ZOFRAN) injection 4 mg (4 mg Intravenous Given 03/01/22 1229)    ED Course/ Medical Decision Making/ A&P                           Medical Decision Making Amount and/or Complexity of Data Reviewed Labs: ordered.  Risk Prescription drug management.   Patient with abdominal pain chest pain.  Seen 2 days ago for same.  Has had nausea and some diarrhea.  Reviewed blood work from that time.  Did show mild colitis on the CT scan.  Today creatinine just slightly elevated from prior.  Was 1.3 yesterday and 1.4 today.  States she is feeling a little anxious about it.  Had decreased oral intake.  Feeling better after IV fluids.  Has tolerated orals.  States she is eating a half a  sandwich.  I think likely was a mild colitis but does not need antibiotics at this time.  Has antiemetics at home.  Willing to increase oral intake.  Has seen Dr. Henrene Pastor in the past from gastroenterology.  We will follow-up with him and PCP as needed.  Appears stable for discharge home.        Final Clinical Impression(s) / ED Diagnoses Final diagnoses:  Colitis  Dehydration    Rx / DC Orders ED Discharge Orders  None         Davonna Belling, MD 03/01/22 1525

## 2022-03-01 NOTE — ED Triage Notes (Signed)
Patient reports ongoing intermittent abdominal pain and chest pain. Seen Friday in ED for same. Nausea with same. Alert and oriented

## 2022-03-01 NOTE — ED Notes (Signed)
Pt provided [place] sandwich and ginger ale per MD

## 2022-03-03 ENCOUNTER — Encounter (HOSPITAL_COMMUNITY): Payer: Self-pay | Admitting: Emergency Medicine

## 2022-03-03 ENCOUNTER — Emergency Department (HOSPITAL_COMMUNITY): Payer: BC Managed Care – PPO

## 2022-03-03 ENCOUNTER — Other Ambulatory Visit: Payer: Self-pay

## 2022-03-03 ENCOUNTER — Emergency Department (HOSPITAL_COMMUNITY)
Admission: EM | Admit: 2022-03-03 | Discharge: 2022-03-03 | Disposition: A | Discharge status: Home / Self Care | Payer: BC Managed Care – PPO | Attending: Emergency Medicine | Admitting: Emergency Medicine

## 2022-03-03 DIAGNOSIS — E119 Type 2 diabetes mellitus without complications: Secondary | ICD-10-CM | POA: Insufficient documentation

## 2022-03-03 DIAGNOSIS — Z7982 Long term (current) use of aspirin: Secondary | ICD-10-CM | POA: Insufficient documentation

## 2022-03-03 DIAGNOSIS — E860 Dehydration: Secondary | ICD-10-CM | POA: Diagnosis not present

## 2022-03-03 DIAGNOSIS — K5900 Constipation, unspecified: Secondary | ICD-10-CM | POA: Insufficient documentation

## 2022-03-03 DIAGNOSIS — R002 Palpitations: Secondary | ICD-10-CM

## 2022-03-03 DIAGNOSIS — K5730 Diverticulosis of large intestine without perforation or abscess without bleeding: Secondary | ICD-10-CM | POA: Diagnosis not present

## 2022-03-03 DIAGNOSIS — Z7984 Long term (current) use of oral hypoglycemic drugs: Secondary | ICD-10-CM | POA: Diagnosis not present

## 2022-03-03 DIAGNOSIS — Z79899 Other long term (current) drug therapy: Secondary | ICD-10-CM | POA: Insufficient documentation

## 2022-03-03 DIAGNOSIS — R1030 Lower abdominal pain, unspecified: Secondary | ICD-10-CM

## 2022-03-03 DIAGNOSIS — R1084 Generalized abdominal pain: Secondary | ICD-10-CM | POA: Diagnosis present

## 2022-03-03 DIAGNOSIS — I10 Essential (primary) hypertension: Secondary | ICD-10-CM | POA: Diagnosis not present

## 2022-03-03 LAB — CBC WITH DIFFERENTIAL/PLATELET
Abs Immature Granulocytes: 0.02 10*3/uL (ref 0.00–0.07)
Basophils Absolute: 0 10*3/uL (ref 0.0–0.1)
Basophils Relative: 0 %
Eosinophils Absolute: 0 10*3/uL (ref 0.0–0.5)
Eosinophils Relative: 0 %
HCT: 37.6 % (ref 36.0–46.0)
Hemoglobin: 12.3 g/dL (ref 12.0–15.0)
Immature Granulocytes: 0 %
Lymphocytes Relative: 24 %
Lymphs Abs: 1.6 10*3/uL (ref 0.7–4.0)
MCH: 30.9 pg (ref 26.0–34.0)
MCHC: 32.7 g/dL (ref 30.0–36.0)
MCV: 94.5 fL (ref 80.0–100.0)
Monocytes Absolute: 0.2 10*3/uL (ref 0.1–1.0)
Monocytes Relative: 4 %
Neutro Abs: 4.8 10*3/uL (ref 1.7–7.7)
Neutrophils Relative %: 72 %
Platelets: 193 10*3/uL (ref 150–400)
RBC: 3.98 MIL/uL (ref 3.87–5.11)
RDW: 13.2 % (ref 11.5–15.5)
WBC: 6.7 10*3/uL (ref 4.0–10.5)
nRBC: 0 % (ref 0.0–0.2)

## 2022-03-03 LAB — COMPREHENSIVE METABOLIC PANEL
ALT: 20 U/L (ref 0–44)
AST: 23 U/L (ref 15–41)
Albumin: 3.9 g/dL (ref 3.5–5.0)
Alkaline Phosphatase: 50 U/L (ref 38–126)
Anion gap: 11 (ref 5–15)
BUN: 12 mg/dL (ref 8–23)
CO2: 25 mmol/L (ref 22–32)
Calcium: 10.2 mg/dL (ref 8.9–10.3)
Chloride: 105 mmol/L (ref 98–111)
Creatinine, Ser: 1.27 mg/dL — ABNORMAL HIGH (ref 0.44–1.00)
GFR, Estimated: 46 mL/min — ABNORMAL LOW (ref >60)
Glucose, Bld: 116 mg/dL — ABNORMAL HIGH (ref 70–99)
Potassium: 3.8 mmol/L (ref 3.5–5.1)
Sodium: 141 mmol/L (ref 135–145)
Total Bilirubin: 0.5 mg/dL (ref 0.3–1.2)
Total Protein: 6.1 g/dL — ABNORMAL LOW (ref 6.5–8.1)

## 2022-03-03 LAB — URINALYSIS, ROUTINE W REFLEX MICROSCOPIC
Bacteria, UA: NONE SEEN
Bilirubin Urine: NEGATIVE
Glucose, UA: NEGATIVE mg/dL
Ketones, ur: NEGATIVE mg/dL
Leukocytes,Ua: NEGATIVE
Nitrite: NEGATIVE
Protein, ur: NEGATIVE mg/dL
Specific Gravity, Urine: 1.012 (ref 1.005–1.030)
pH: 7 (ref 5.0–8.0)

## 2022-03-03 LAB — LIPASE, BLOOD: Lipase: 42 U/L (ref 11–51)

## 2022-03-03 LAB — TROPONIN I (HIGH SENSITIVITY): Troponin I (High Sensitivity): 5 ng/L (ref <18)

## 2022-03-03 MED ORDER — ONDANSETRON HCL 4 MG/2ML IJ SOLN
4.0000 mg | Freq: Once | INTRAMUSCULAR | Status: DC
  Filled 2022-03-03: qty 2

## 2022-03-03 MED ORDER — SODIUM CHLORIDE 0.9 % IV BOLUS: 1000.0000 mL | Freq: Once | INTRAVENOUS | Status: DC

## 2022-03-03 MED ORDER — POLYETHYLENE GLYCOL 3350 17 G PO PACK: 17.0000 g | PACK | Freq: Two times a day (BID) | ORAL | 0 refills | Status: DC

## 2022-03-03 MED ORDER — DOCUSATE SODIUM 100 MG PO CAPS: 100.0000 mg | ORAL_CAPSULE | Freq: Two times a day (BID) | ORAL | 0 refills | Status: DC | PRN

## 2022-03-03 MED ORDER — IOHEXOL 300 MG/ML  SOLN
100.0000 mL | Freq: Once | INTRAMUSCULAR | Status: AC | PRN
Start: 2022-03-03 — End: 2022-03-03
  Administered 2022-03-03: 100 mL via INTRAVENOUS

## 2022-03-03 NOTE — ED Triage Notes (Signed)
Pt complains of abd pain and chest pain since the weekend. Describes the chest pain as feeling "jittery." Pt reports being constipated since Friday. Pt was seen in the ED for these issues.Pt says she also feels nauseous.

## 2022-03-03 NOTE — Discharge Instructions (Addendum)
As we discussed, your CT scan did not show any inflammation right now.  You do have constipation however  Take MiraLAX twice daily for a week.  Please take Colace twice daily as needed  Please follow-up with Dr. Henrene Pastor, your GI doctor   Return to ER if you have worse abdominal pain, constipation, vomiting

## 2022-03-03 NOTE — ED Provider Notes (Signed)
[facility] EMERGENCY DEPARTMENT Provider Note   CSN: [id number] Arrival date & time: 03/03/22  1036     History  Chief Complaint  Patient presents with   Abdominal Pain   Chest Pain    Rachel Black is a 71 y.o. female history diabetes, hypertension here presenting with abdominal pain and constipation.  Patient states that she had diffuse abdominal pain for the last several days.  Patient came to the ED 2 days ago and had CT abdomen pelvis that showed colitis.  Her white blood cell count was normal and she was not running a fever so she was thought to have viral infection and was not given any antibiotics.  Patient states that she had no bowel movement since then.  Has worsening diffuse abdominal pain and nausea.  Denies any fevers.  Patient initially told triage she had some chest pain but told the PA that she just has some palpitations.  Patient was seen here several days ago for palpitations and had negative troponins at that time  The history is provided by the patient.       Home Medications Prior to Admission medications   Medication Sig Start Date End Date Taking? Authorizing Provider  ACCU-CHEK AVIVA PLUS test strip USE AS INSTRUCTED ONCE A DAY. DX CODE E11.9 02/10/22   Lorrene Reid, PA-C  ACCU-CHEK SOFTCLIX LANCETS lancets Use as instructed once a day.  DX Code: E11.9 09/21/17   Carollee Herter, Alferd Apa, DO  allopurinol (ZYLOPRIM) 300 MG tablet Take 1 tablet (300 mg total) by mouth 2 (two) times daily. 08/26/21   Lorrene Reid, PA-C  aspirin 81 MG EC tablet Take 1 tablet (81 mg total) by mouth daily. 10/18/17   Cheryln Manly, NP  Calcium Carb-Cholecalciferol (CALCIUM 600-D PO) Take 1 tablet by mouth 2 (two) times daily before a meal.    [provider]  dexlansoprazole (DEXILANT) 60 MG capsule TAKE 1 CAPSULE BY MOUTH 2 TIMES DAILY. Patient taking differently: Take 60 mg by mouth in the morning and at bedtime. 12/29/21   Esterwood, [name] S, PA-C   Emollient (UDDERLY SMOOTH) CREA Apply 1 application topically See admin instructions. Apply topically to skin folds as needed for rash/irritation    [provider]  fenofibrate 160 MG tablet TAKE 1 TABLET BY MOUTH EVERY DAY Patient taking differently: Take 160 mg by mouth daily. 02/16/22   Lorrene Reid, PA-C  ferrous sulfate 325 (65 FE) MG tablet TAKE 1 TABLET BY MOUTH TWICE A DAY Patient taking differently: Take 325 mg by mouth 2 (two) times daily with a meal. 02/02/22   Zehr, Janett Billow D, PA-C  isosorbide mononitrate (IMDUR) 30 MG 24 hr tablet TAKE 1 TABLET BY MOUTH EVERY DAY Patient taking differently: Take 30 mg by mouth daily. 06/18/21   Minus Breeding, MD  JANUMET XR 50-500 MG TB24 TAKE 1 TABLET BY MOUTH EVERY DAY Patient not taking: Reported on 02/27/2022 12/26/21   Lorrene Reid, PA-C  olmesartan (BENICAR) 5 MG tablet TAKE 1 TABLET (5 MG TOTAL) BY MOUTH DAILY. 02/02/22   Lorrene Reid, PA-C  ondansetron (ZOFRAN) 4 MG tablet Take 1 tablet (4 mg total) by mouth every 8 (eight) hours as needed for nausea or vomiting. 02/27/22   Tegeler, Gwenyth Allegra, MD  pantoprazole (PROTONIX) 40 MG tablet Take 1 tablet (40 mg total) by mouth daily. 01/14/22   Irene Shipper, MD  rosuvastatin (CRESTOR) 40 MG tablet Take 1 tablet (40 mg total) by mouth daily. PLEASE MAKE  APPOINTMENT WITH CARDIOLOGIST FOR FURTHER REFILLS Patient taking differently: Take 40 mg by mouth daily. 02/03/22   Minus Breeding, MD  Vitamin D, Ergocalciferol, (DRISDOL) 1.25 MG (50000 UNIT) CAPS capsule TAKE 1 CAPSULE BY MOUTH ONE TIME PER WEEK Patient taking differently: Take 50,000 Units by mouth every 7 (seven) days. Takes on Monday's 02/02/22   Lorrene Reid, PA-C      Allergies    Other and Penicillins    Review of Systems   Review of Systems  Cardiovascular:  Positive for chest pain.  Gastrointestinal:  Positive for abdominal pain.  All other systems reviewed and are negative.   Physical Exam Updated Vital  Signs BP 102/67 (BP Location: Left Arm)   Pulse 75   Temp 98.8 F (37.1 C) (Oral)   Resp 16   SpO2 99%  Physical Exam Vitals and nursing note reviewed.  Constitutional:      Comments: Slightly dehydrated  HENT:     Head: Normocephalic.     Mouth/Throat:     Pharynx: Oropharynx is clear.  Eyes:     Extraocular Movements: Extraocular movements intact.     Pupils: Pupils are equal, round, and reactive to light.  Cardiovascular:     Rate and Rhythm: Normal rate and regular rhythm.  Abdominal:     General: Abdomen is flat.     Comments: Mild diffuse tenderness but no rebound  Genitourinary:    Comments: Rectal- no obvious stool impaction  Skin:    General: Skin is warm.     Capillary Refill: Capillary refill takes less than 2 seconds.  Neurological:     General: No focal deficit present.     Mental Status: She is oriented to person, place, and time.  Psychiatric:        Mood and Affect: Mood normal.        Behavior: Behavior normal.     ED Results / Procedures / Treatments   Labs (all labs ordered are listed, but only abnormal results are displayed) Labs Reviewed  COMPREHENSIVE METABOLIC PANEL - Abnormal; Notable for the following components:      Result Value   Glucose, Bld 116 (*)    Creatinine, Ser 1.27 (*)    Total Protein 6.1 (*)    GFR, Estimated 46 (*)    All other components within normal limits  URINALYSIS, ROUTINE W REFLEX MICROSCOPIC - Abnormal; Notable for the following components:   APPearance HAZY (*)    Hgb urine dipstick SMALL (*)    All other components within normal limits  CBC WITH DIFFERENTIAL/PLATELET  LIPASE, BLOOD    EKG None  Radiology No results found.  Procedures Procedures    Medications Ordered in ED Medications  sodium chloride 0.9 % bolus 1,000 mL (has no administration in time range)  ondansetron (ZOFRAN) injection 4 mg (has no administration in time range)    ED Course/ Medical Decision Making/ A&P                            Medical Decision Making Rachel Black is a 70 y.o. female here presenting with abdominal pain and constipation.  Patient has no stool impaction on rectal exam.  Patient likely has pain from constipation versus worsening colitis versus abscess.  Plan to get CBC and CMP and lipase and CT abdomen pelvis.  Will hydrate and reassess  7:35 PM I reviewed patient's labs and independently reviewed her CT scan.  Labs unremarkable including  1 troponin.  Patient has been having intermittent palpitations for a week now.  CT abdomen pelvis showed diverticulosis with no diverticulitis.  Patient also has no colitis on the CT anymore.  I think her symptoms are likely secondary to constipation.  Recommend MiraLAX twice daily.  Also recommend Colace and follow-up with her GI doctor.  Problems Addressed: Constipation, unspecified constipation type: acute illness or injury Lower abdominal pain: acute illness or injury Palpitations: acute illness or injury  Amount and/or Complexity of Data Reviewed Labs: ordered. Decision-making details documented in ED Course. Radiology: ordered and independent interpretation performed. Decision-making details documented in ED Course. ECG/medicine tests: ordered and independent interpretation performed. Decision-making details documented in ED Course.  Risk Prescription drug management.    Final Clinical Impression(s) / ED Diagnoses Final diagnoses:  None    Rx / DC Orders ED Discharge Orders     None         Drenda Freeze, MD 03/03/22 [phone]

## 2022-03-03 NOTE — ED Provider Triage Note (Signed)
Emergency Medicine Provider Triage Evaluation Note  Rachel Black , a 71 y.o. female  was evaluated in triage.  Pt complains of abdominal pain x 5 days.  Evaluated here over the weekend and has continued to feel some generalized abdominal pain, up cramping in nature.  She endorses some nausea and did some spitting but no vomiting.  Reports no bowel movement in the past 6 days.  No sick contacts, no fevers  Review of Systems  Positive: Abdominal pain  Negative: Fever, vomiting  Physical Exam  BP 107/67 (BP Location: Left Arm)   Pulse 66   Temp 98.8 F (37.1 C) (Oral)   Resp 18   SpO2 99%  Gen:   Awake, no distress   Resp:  Normal effort  MSK:   Moves extremities without difficulty  Other:  Abdomen is soft, nontender to palpation.  Medical Decision Making  Medically screening exam initiated at 11:01 AM.  Appropriate orders placed.  Rachel Black was informed that the remainder of the evaluation will be completed by another provider, this initial triage assessment does not replace that evaluation, and the importance of remaining in the ED until their evaluation is complete.  Patient recently seen in the hospital for colitis, went home after symptomatic treatment in the ED.  Returns today with worsening abdominal pain throughout.  Endorsing nausea along with vomiting and some constipation for the past 6 days.   Janeece Fitting, PA-C 03/03/22 1107

## 2022-03-05 ENCOUNTER — Emergency Department (HOSPITAL_COMMUNITY)
Admission: EM | Admit: 2022-03-05 | Discharge: 2022-03-05 | Disposition: A | Discharge status: Home / Self Care | Payer: BC Managed Care – PPO | Attending: Emergency Medicine | Admitting: Emergency Medicine

## 2022-03-05 ENCOUNTER — Emergency Department (HOSPITAL_COMMUNITY): Payer: BC Managed Care – PPO

## 2022-03-05 ENCOUNTER — Encounter (HOSPITAL_COMMUNITY): Payer: Self-pay | Admitting: Emergency Medicine

## 2022-03-05 ENCOUNTER — Other Ambulatory Visit: Payer: Self-pay

## 2022-03-05 DIAGNOSIS — E119 Type 2 diabetes mellitus without complications: Secondary | ICD-10-CM | POA: Diagnosis not present

## 2022-03-05 DIAGNOSIS — Z7984 Long term (current) use of oral hypoglycemic drugs: Secondary | ICD-10-CM | POA: Insufficient documentation

## 2022-03-05 DIAGNOSIS — I10 Essential (primary) hypertension: Secondary | ICD-10-CM | POA: Insufficient documentation

## 2022-03-05 DIAGNOSIS — Z79899 Other long term (current) drug therapy: Secondary | ICD-10-CM | POA: Diagnosis not present

## 2022-03-05 DIAGNOSIS — R110 Nausea: Secondary | ICD-10-CM | POA: Diagnosis not present

## 2022-03-05 DIAGNOSIS — K5900 Constipation, unspecified: Secondary | ICD-10-CM | POA: Insufficient documentation

## 2022-03-05 DIAGNOSIS — R109 Unspecified abdominal pain: Secondary | ICD-10-CM | POA: Insufficient documentation

## 2022-03-05 DIAGNOSIS — R079 Chest pain, unspecified: Secondary | ICD-10-CM | POA: Diagnosis present

## 2022-03-05 DIAGNOSIS — Z7982 Long term (current) use of aspirin: Secondary | ICD-10-CM | POA: Diagnosis not present

## 2022-03-05 LAB — URINALYSIS, ROUTINE W REFLEX MICROSCOPIC
Bilirubin Urine: NEGATIVE
Glucose, UA: NEGATIVE mg/dL
Hgb urine dipstick: NEGATIVE
Ketones, ur: NEGATIVE mg/dL
Nitrite: NEGATIVE
Protein, ur: NEGATIVE mg/dL
Specific Gravity, Urine: 1.013 (ref 1.005–1.030)
pH: 5 (ref 5.0–8.0)

## 2022-03-05 LAB — CBC WITH DIFFERENTIAL/PLATELET
Abs Immature Granulocytes: 0.03 10*3/uL (ref 0.00–0.07)
Basophils Absolute: 0 10*3/uL (ref 0.0–0.1)
Basophils Relative: 0 %
Eosinophils Absolute: 0 10*3/uL (ref 0.0–0.5)
Eosinophils Relative: 0 %
HCT: 38.1 % (ref 36.0–46.0)
Hemoglobin: 12.5 g/dL (ref 12.0–15.0)
Immature Granulocytes: 0 %
Lymphocytes Relative: 29 %
Lymphs Abs: 2 10*3/uL (ref 0.7–4.0)
MCH: 31.4 pg (ref 26.0–34.0)
MCHC: 32.8 g/dL (ref 30.0–36.0)
MCV: 95.7 fL (ref 80.0–100.0)
Monocytes Absolute: 0.3 10*3/uL (ref 0.1–1.0)
Monocytes Relative: 4 %
Neutro Abs: 4.5 10*3/uL (ref 1.7–7.7)
Neutrophils Relative %: 67 %
Platelets: 197 10*3/uL (ref 150–400)
RBC: 3.98 MIL/uL (ref 3.87–5.11)
RDW: 13.3 % (ref 11.5–15.5)
WBC: 6.8 10*3/uL (ref 4.0–10.5)
nRBC: 0 % (ref 0.0–0.2)

## 2022-03-05 LAB — COMPREHENSIVE METABOLIC PANEL WITH GFR
ALT: 18 U/L (ref 0–44)
AST: 23 U/L (ref 15–41)
Albumin: 3.7 g/dL (ref 3.5–5.0)
Alkaline Phosphatase: 50 U/L (ref 38–126)
Anion gap: 7 (ref 5–15)
BUN: 15 mg/dL (ref 8–23)
CO2: 26 mmol/L (ref 22–32)
Calcium: 9.5 mg/dL (ref 8.9–10.3)
Chloride: 107 mmol/L (ref 98–111)
Creatinine, Ser: 1.46 mg/dL — ABNORMAL HIGH (ref 0.44–1.00)
GFR, Estimated: 39 mL/min — ABNORMAL LOW (ref >60)
Glucose, Bld: 138 mg/dL — ABNORMAL HIGH (ref 70–99)
Potassium: 4.1 mmol/L (ref 3.5–5.1)
Sodium: 140 mmol/L (ref 135–145)
Total Bilirubin: 0.7 mg/dL (ref 0.3–1.2)
Total Protein: 5.9 g/dL — ABNORMAL LOW (ref 6.5–8.1)

## 2022-03-05 LAB — LACTIC ACID, PLASMA: Lactic Acid, Venous: 1.4 mmol/L (ref 0.5–1.9)

## 2022-03-05 LAB — TROPONIN I (HIGH SENSITIVITY)
Troponin I (High Sensitivity): 5 ng/L (ref <18)
Troponin I (High Sensitivity): 5 ng/L (ref <18)

## 2022-03-05 LAB — LIPASE, BLOOD: Lipase: 42 U/L (ref 11–51)

## 2022-03-05 MED ORDER — ONDANSETRON HCL 4 MG/2ML IJ SOLN
4.0000 mg | Freq: Once | INTRAMUSCULAR | Status: AC
  Administered 2022-03-05: 4 mg via INTRAVENOUS
  Filled 2022-03-05: qty 2

## 2022-03-05 MED ORDER — SODIUM CHLORIDE 0.9 % IV BOLUS
1000.0000 mL | Freq: Once | INTRAVENOUS | Status: AC
Start: 2022-03-05 — End: 2022-03-05
  Administered 2022-03-05: 1000 mL via INTRAVENOUS

## 2022-03-05 MED ORDER — ALUM & MAG HYDROXIDE-SIMETH 200-200-20 MG/5ML PO SUSP
30.0000 mL | Freq: Once | ORAL | Status: AC
  Administered 2022-03-05: 30 mL via ORAL
  Filled 2022-03-05: qty 30

## 2022-03-05 MED ORDER — LIDOCAINE VISCOUS HCL 2 % MT SOLN
15.0000 mL | Freq: Once | OROMUCOSAL | Status: AC
  Administered 2022-03-05: 15 mL via ORAL
  Filled 2022-03-05: qty 15

## 2022-03-05 MED ORDER — FAMOTIDINE IN NACL 20-0.9 MG/50ML-% IV SOLN
20.0000 mg | Freq: Once | INTRAVENOUS | Status: AC
  Administered 2022-03-05: 20 mg via INTRAVENOUS
  Filled 2022-03-05: qty 50

## 2022-03-05 NOTE — ED Provider Triage Note (Cosign Needed)
Emergency Medicine Provider Triage Evaluation Note  SHANTIA SANFORD , a 71 y.o. female  was evaluated in triage.  Pt complains of chest tightness, palpitations, nausea with minimal vomiting, constipation.  Patient was seen in the emergency department for similar symptoms on 6/16, 6/18, 03/03/2022.  States that her chest hurt more this morning causing her to come back in and get rechecked.  Review of Systems  Positive: CP, nausea Negative: fever  Physical Exam  BP 112/77 (BP Location: Right Arm)   Pulse 94   Temp 98.8 F (37.1 C) (Oral)   Resp 16   SpO2 100%  Gen:   Awake, no distress   Resp:  Normal effort  MSK:   Moves extremities without difficulty  Other:  Mild epigastric tenderness to palpation.  Medical Decision Making  Medically screening exam initiated at 1:37 PM.  Appropriate orders placed.  [name] was informed that the remainder of the evaluation will be completed by another provider, this initial triage assessment does not replace that evaluation, and the importance of remaining in the ED until their evaluation is complete.     Carlisle Cater, PA-C 03/05/22 1338

## 2022-03-05 NOTE — ED Triage Notes (Signed)
Patient here with complaint of chest and abdominal pain that started approximately one week ago. Patient states "my heart feels jittery". Patient is alert, oriented, speaking in complete sentences, and is in no apparent distress at this time.

## 2022-03-05 NOTE — ED Provider Notes (Signed)
[facility] EMERGENCY DEPARTMENT Provider Note   CSN: [id number] Arrival date & time: 03/05/22  1319     History  Chief Complaint  Patient presents with   Chest Pain    Rachel Black is a 71 y.o. female.  HPI  71 year old female with past medical history of HTN, HLD, DM presents emergency department with ongoing abdominal/chest pain.  Patient's been seen here multiple times in this past week for the same complaint.  She had a thorough evaluation including 2 CTs.  Initial findings was constipation.  Cardiac work-ups have been negative.  Patient improved with Zofran.  Had slight improvement with MiraLAX.  She has had multiple bowel movements today.  Denies any abdominal distention.  Currently complaining of mid abdominal pain that radiates up into her chest.  Mild associated nausea but no vomiting.  No fever or genitourinary symptoms.  Currently does not follow-up with GI.  Home Medications Prior to Admission medications   Medication Sig Start Date End Date Taking? Authorizing Provider  allopurinol (ZYLOPRIM) 300 MG tablet Take 1 tablet (300 mg total) by mouth 2 (two) times daily. 08/26/21  Yes Lorrene Reid, PA-C  aspirin 81 MG EC tablet Take 1 tablet (81 mg total) by mouth daily. 10/18/17  Yes Reino Bellis B, NP  Calcium Carb-Cholecalciferol (CALCIUM 600-D PO) Take 1 tablet by mouth 2 (two) times daily before a meal.   Yes [provider]  dexlansoprazole (DEXILANT) 60 MG capsule TAKE 1 CAPSULE BY MOUTH 2 TIMES DAILY. Patient taking differently: Take 60 mg by mouth in the morning and at bedtime. 12/29/21  Yes Esterwood, [name] S, PA-C  docusate sodium (COLACE) 100 MG capsule Take 1 capsule (100 mg total) by mouth 2 (two) times daily as needed for mild constipation. 03/03/22  Yes Drenda Freeze, MD  fenofibrate 160 MG tablet TAKE 1 TABLET BY MOUTH EVERY DAY Patient taking differently: Take 160 mg by mouth daily. 02/16/22  Yes Abonza, Maritza, PA-C  ferrous  sulfate 325 (65 FE) MG tablet TAKE 1 TABLET BY MOUTH TWICE A DAY Patient taking differently: Take 325 mg by mouth 2 (two) times daily with a meal. 02/02/22  Yes Zehr, Janett Billow D, PA-C  Vitamin D, Ergocalciferol, (DRISDOL) 1.25 MG (50000 UNIT) CAPS capsule TAKE 1 CAPSULE BY MOUTH ONE TIME PER WEEK Patient taking differently: Take 50,000 Units by mouth every 7 (seven) days. Takes on Monday's 02/02/22  Yes Abonza, Maritza, PA-C  ACCU-CHEK AVIVA PLUS test strip USE AS INSTRUCTED ONCE A DAY. DX CODE E11.9 02/10/22   Lorrene Reid, PA-C  ACCU-CHEK SOFTCLIX LANCETS lancets Use as instructed once a day.  DX Code: E11.9 09/21/17   Carollee Herter, Yvonne R, DO  Emollient (UDDERLY SMOOTH) CREA Apply 1 application topically See admin instructions. Apply topically to skin folds as needed for rash/irritation    [provider]  isosorbide mononitrate (IMDUR) 30 MG 24 hr tablet TAKE 1 TABLET BY MOUTH EVERY DAY Patient taking differently: Take 30 mg by mouth daily. 06/18/21   Minus Breeding, MD  JANUMET XR 50-500 MG TB24 TAKE 1 TABLET BY MOUTH EVERY DAY Patient not taking: Reported on 02/27/2022 12/26/21   Lorrene Reid, PA-C  olmesartan (BENICAR) 5 MG tablet TAKE 1 TABLET (5 MG TOTAL) BY MOUTH DAILY. 02/02/22   Lorrene Reid, PA-C  ondansetron (ZOFRAN) 4 MG tablet Take 1 tablet (4 mg total) by mouth every 8 (eight) hours as needed for nausea or vomiting. 02/27/22   Tegeler, Gwenyth Allegra, MD  ondansetron (  ZOFRAN-ODT) 4 MG disintegrating tablet Take 4 mg by mouth every 8 (eight) hours as needed. 02/27/22   [provider]  pantoprazole (PROTONIX) 40 MG tablet Take 1 tablet (40 mg total) by mouth daily. 01/14/22   Irene Shipper, MD  polyethylene glycol (MIRALAX / GLYCOLAX) 17 g packet Take 17 g by mouth 2 (two) times daily. 03/03/22   Drenda Freeze, MD  rosuvastatin (CRESTOR) 40 MG tablet Take 1 tablet (40 mg total) by mouth daily. PLEASE MAKE APPOINTMENT WITH CARDIOLOGIST FOR FURTHER REFILLS Patient  taking differently: Take 40 mg by mouth daily. 02/03/22   Minus Breeding, MD      Allergies    Other and Penicillins    Review of Systems   Review of Systems  Constitutional:  Positive for fatigue. Negative for fever.  Respiratory:  Negative for shortness of breath.   Cardiovascular:  Positive for chest pain. Negative for palpitations and leg swelling.  Gastrointestinal:  Positive for abdominal pain and nausea. Negative for diarrhea and vomiting.  Skin:  Negative for rash.  Neurological:  Negative for headaches.    Physical Exam Updated Vital Signs BP 116/72   Pulse (!) 54   Temp 98.5 F (36.9 C) (Oral)   Resp 18   SpO2 100%  Physical Exam Vitals and nursing note reviewed.  Constitutional:      General: She is not in acute distress.    Appearance: Normal appearance. She is not ill-appearing.  HENT:     Head: Normocephalic.     Mouth/Throat:     Mouth: Mucous membranes are moist.  Cardiovascular:     Rate and Rhythm: Normal rate.  Pulmonary:     Effort: Pulmonary effort is normal. No respiratory distress.  Abdominal:     Palpations: Abdomen is soft.     Tenderness: There is no abdominal tenderness.     Comments: Mid/left mid abdominal discomfort with palpation.  Abdomen is soft, nondistended, no guarding  Skin:    General: Skin is warm.  Neurological:     Mental Status: She is alert and oriented to person, place, and time. Mental status is at baseline.  Psychiatric:        Mood and Affect: Mood normal.     ED Results / Procedures / Treatments   Labs (all labs ordered are listed, but only abnormal results are displayed) Labs Reviewed  COMPREHENSIVE METABOLIC PANEL - Abnormal; Notable for the following components:      Result Value   Glucose, Bld 138 (*)    Creatinine, Ser 1.46 (*)    Total Protein 5.9 (*)    GFR, Estimated 39 (*)    All other components within normal limits  URINALYSIS, ROUTINE W REFLEX MICROSCOPIC - Abnormal; Notable for the following  components:   APPearance HAZY (*)    Leukocytes,Ua TRACE (*)    Bacteria, UA FEW (*)    All other components within normal limits  CBC WITH DIFFERENTIAL/PLATELET  LACTIC ACID, PLASMA  LACTIC ACID, PLASMA  LIPASE, BLOOD  TROPONIN I (HIGH SENSITIVITY)  TROPONIN I (HIGH SENSITIVITY)    EKG EKG Interpretation  Date/Time:  Thursday March 05 2022 13:26:00 EDT Ventricular Rate:  92 PR Interval:  176 QRS Duration: 98 QT Interval:  346 QTC Calculation: 427 R Axis:   -84 Text Interpretation: Normal sinus rhythm Left anterior fascicular block Possible Anterolateral infarct , age undetermined Abnormal ECG When compared with ECG of 01-Mar-2022 10:30, PREVIOUS ECG IS PRESENT Similar to previous Confirmed by Mirna Sutcliffe,  Drue Dun [id number]) on 03/05/2022 4:55:12 PM  Radiology DG Chest 2 View  Result Date: 03/05/2022 CLINICAL DATA:  Chest pain. EXAM: CHEST - 2 VIEW COMPARISON:  February 27, 2022. FINDINGS: The heart size and mediastinal contours are within normal limits. Both lungs are clear. The visualized skeletal structures are unremarkable. IMPRESSION: No active cardiopulmonary disease. Electronically Signed   By: Marijo Conception M.D.   On: 03/05/2022 14:38   CT ABDOMEN PELVIS W CONTRAST  Result Date: 03/03/2022 CLINICAL DATA:  Left lower quadrant pain for several days, initial encounter EXAM: CT ABDOMEN AND PELVIS WITH CONTRAST TECHNIQUE: Multidetector CT imaging of the abdomen and pelvis was performed using the standard protocol following bolus administration of intravenous contrast. RADIATION DOSE REDUCTION: This exam was performed according to the departmental dose-optimization program which includes automated exposure control, adjustment of the mA and/or kV according to patient size and/or use of iterative reconstruction technique. CONTRAST:  148m OMNIPAQUE IOHEXOL 300 MG/ML  SOLN COMPARISON:  02/27/2022 FINDINGS: Lower chest: Lung bases are free of acute infiltrate or sizable effusion. Hepatobiliary:  Fatty infiltration of the liver is noted. Pneumobilia is seen related to prior intervention. Gallbladder has been surgically removed. No biliary ductal dilatation is noted. Pancreas: Unremarkable. No pancreatic ductal dilatation or surrounding inflammatory changes. Spleen: Normal in size without focal abnormality. Adrenals/Urinary Tract: Adrenal glands are within normal limits. Kidneys demonstrate a normal enhancement pattern bilaterally. Small peripheral cyst is noted within the left kidney stable in appearance from the prior exam. No further follow-up is recommended. No renal calculi or obstructive changes are seen. The bladder is partially distended. Stomach/Bowel: Colon shows scattered diverticular change and wall thickening within the sigmoid colon although stable in appearance. No significant inflammatory change to suggest acute diverticulitis is noted. Remainder of the colon appears within normal limits. The appendix is well visualized and within normal limits. Small bowel and stomach are within normal limits. Vascular/Lymphatic: No significant vascular findings are present. No enlarged abdominal or pelvic lymph nodes. Reproductive: Status post hysterectomy. No adnexal masses. Other: No abdominal wall hernia or abnormality. No abdominopelvic ascites. Musculoskeletal: Degenerative changes of lumbar spine are noted. IMPRESSION: Diverticulosis without diverticulitis. Previously seen ascending colon inflammatory changes have resolved. Chronic pneumobilia stable in appearance. Fatty liver is noted as well. No acute abnormality is noted. Electronically Signed   By: MInez CatalinaM.D.   On: 03/03/2022 19:16    Procedures Procedures    Medications Ordered in ED Medications  sodium chloride 0.9 % bolus 1,000 mL (1,000 mLs Intravenous New Bag/Given 03/05/22 1842)  ondansetron (ZOFRAN) injection 4 mg (4 mg Intravenous Given 03/05/22 1750)  famotidine (PEPCID) IVPB 20 mg premix (20 mg Intravenous New Bag/Given  03/05/22 1751)  alum & mag hydroxide-simeth (MAALOX/MYLANTA) 200-200-20 MG/5ML suspension 30 mL (30 mLs Oral Given 03/05/22 1756)    And  lidocaine (XYLOCAINE) 2 % viscous mouth solution 15 mL (15 mLs Oral Given 03/05/22 1756)    ED Course/ Medical Decision Making/ A&P                           Medical Decision Making Amount and/or Complexity of Data Reviewed Labs: ordered.  Risk OTC drugs. Prescription drug management.   71year old female presents to the emergency department with ongoing abdominal pain.  Associated with nausea.  Patient's been seen multiple times in the ER for the same complaint.  Has had reassuring work-ups, negative CTs.  Treated for nausea/constipation with slight improvement of symptoms.  Has no outpatient GI follow-up.  Vitals are stable on arrival.  Abdomen is soft, mild tenderness to palpation, no guarding.  Blood work is normal and reassuring.  Lactic is normal.  Patient's pain actually improves after food intake.  Low suspicion for chronic mesenteric ischemia.  Given the fact the patient has had 2 CT scans in the past week I do not feel emergent CT scan would be beneficial in this case.  After medications and IV treatment here she feels improved.  I believe the patient needs close GI follow-up for possible endoscopy/colonoscopy for further evaluation of symptoms.  No signs of acute medical emergency/surgical problem.  Patient's been able to p.o., eating again improved her symptoms.  Patient at this time appears safe and stable for discharge and close outpatient follow up. Discharge plan and strict return to ED precautions discussed, patient verbalizes understanding and agreement.        Final Clinical Impression(s) / ED Diagnoses Final diagnoses:  None    Rx / DC Orders ED Discharge Orders     None         Lorelle Gibbs, DO 03/05/22 2127

## 2022-03-05 NOTE — ED Notes (Signed)
Patient ambulates to the restroom with a steady gait and provided with meal and drink when she returns. Patient and family verbalize understanding of the plan of care

## 2022-03-05 NOTE — Discharge Instructions (Signed)
You have been seen and discharged from the emergency department.  Your blood work was normal.  I believe we need to follow-up with gastroenterology, possibly have endoscopy/colonoscopy for further evaluation of function/inner tissue of GI system. Take home medications as prescribed. If you have any worsening symptoms, severe intractable pain, inability to eat, high fevers or further concerns for your health please return to an emergency department for further evaluation.

## 2022-03-07 ENCOUNTER — Emergency Department (HOSPITAL_COMMUNITY)
Admission: EM | Admit: 2022-03-07 | Discharge: 2022-03-07 | Disposition: A | Discharge status: Home / Self Care | Payer: BC Managed Care – PPO | Attending: Emergency Medicine | Admitting: Emergency Medicine

## 2022-03-07 ENCOUNTER — Other Ambulatory Visit: Payer: Self-pay

## 2022-03-07 ENCOUNTER — Emergency Department (HOSPITAL_COMMUNITY): Payer: BC Managed Care – PPO

## 2022-03-07 DIAGNOSIS — Z79899 Other long term (current) drug therapy: Secondary | ICD-10-CM | POA: Diagnosis not present

## 2022-03-07 DIAGNOSIS — R1010 Upper abdominal pain, unspecified: Secondary | ICD-10-CM | POA: Insufficient documentation

## 2022-03-07 DIAGNOSIS — Z7984 Long term (current) use of oral hypoglycemic drugs: Secondary | ICD-10-CM | POA: Insufficient documentation

## 2022-03-07 DIAGNOSIS — R0789 Other chest pain: Secondary | ICD-10-CM | POA: Insufficient documentation

## 2022-03-07 DIAGNOSIS — R002 Palpitations: Secondary | ICD-10-CM | POA: Insufficient documentation

## 2022-03-07 DIAGNOSIS — I2510 Atherosclerotic heart disease of native coronary artery without angina pectoris: Secondary | ICD-10-CM | POA: Insufficient documentation

## 2022-03-07 DIAGNOSIS — Z7982 Long term (current) use of aspirin: Secondary | ICD-10-CM | POA: Insufficient documentation

## 2022-03-07 DIAGNOSIS — E119 Type 2 diabetes mellitus without complications: Secondary | ICD-10-CM | POA: Diagnosis not present

## 2022-03-07 DIAGNOSIS — I10 Essential (primary) hypertension: Secondary | ICD-10-CM | POA: Diagnosis not present

## 2022-03-07 DIAGNOSIS — R109 Unspecified abdominal pain: Secondary | ICD-10-CM

## 2022-03-07 LAB — LIPASE, BLOOD: Lipase: 51 U/L (ref 11–51)

## 2022-03-07 LAB — CBC
HCT: 36.8 % (ref 36.0–46.0)
Hemoglobin: 12.3 g/dL (ref 12.0–15.0)
MCH: 32.1 pg (ref 26.0–34.0)
MCHC: 33.4 g/dL (ref 30.0–36.0)
MCV: 96.1 fL (ref 80.0–100.0)
Platelets: 197 10*3/uL (ref 150–400)
RBC: 3.83 MIL/uL — ABNORMAL LOW (ref 3.87–5.11)
RDW: 13.4 % (ref 11.5–15.5)
WBC: 7.9 10*3/uL (ref 4.0–10.5)
nRBC: 0 % (ref 0.0–0.2)

## 2022-03-07 LAB — BASIC METABOLIC PANEL
Anion gap: 7 (ref 5–15)
BUN: 10 mg/dL (ref 8–23)
CO2: 23 mmol/L (ref 22–32)
Calcium: 9.5 mg/dL (ref 8.9–10.3)
Chloride: 108 mmol/L (ref 98–111)
Creatinine, Ser: 1.32 mg/dL — ABNORMAL HIGH (ref 0.44–1.00)
GFR, Estimated: 44 mL/min — ABNORMAL LOW (ref >60)
Glucose, Bld: 109 mg/dL — ABNORMAL HIGH (ref 70–99)
Potassium: 3.9 mmol/L (ref 3.5–5.1)
Sodium: 138 mmol/L (ref 135–145)

## 2022-03-07 LAB — TROPONIN I (HIGH SENSITIVITY)
Troponin I (High Sensitivity): 5 ng/L (ref <18)
Troponin I (High Sensitivity): 5 ng/L (ref <18)

## 2022-03-08 ENCOUNTER — Encounter (HOSPITAL_COMMUNITY): Payer: Self-pay | Admitting: Pharmacy Technician

## 2022-03-08 ENCOUNTER — Emergency Department (HOSPITAL_COMMUNITY): Payer: BC Managed Care – PPO

## 2022-03-08 ENCOUNTER — Emergency Department (HOSPITAL_COMMUNITY)
Admission: EM | Admit: 2022-03-08 | Discharge: 2022-03-08 | Disposition: A | Discharge status: Home / Self Care | Payer: BC Managed Care – PPO | Attending: Emergency Medicine | Admitting: Emergency Medicine

## 2022-03-08 ENCOUNTER — Other Ambulatory Visit: Payer: Self-pay

## 2022-03-08 DIAGNOSIS — R110 Nausea: Secondary | ICD-10-CM | POA: Diagnosis not present

## 2022-03-08 DIAGNOSIS — R079 Chest pain, unspecified: Secondary | ICD-10-CM | POA: Insufficient documentation

## 2022-03-08 DIAGNOSIS — R0600 Dyspnea, unspecified: Secondary | ICD-10-CM | POA: Diagnosis not present

## 2022-03-08 DIAGNOSIS — Z79899 Other long term (current) drug therapy: Secondary | ICD-10-CM | POA: Insufficient documentation

## 2022-03-08 DIAGNOSIS — E119 Type 2 diabetes mellitus without complications: Secondary | ICD-10-CM | POA: Diagnosis not present

## 2022-03-08 DIAGNOSIS — I10 Essential (primary) hypertension: Secondary | ICD-10-CM | POA: Diagnosis not present

## 2022-03-08 DIAGNOSIS — Z7984 Long term (current) use of oral hypoglycemic drugs: Secondary | ICD-10-CM | POA: Diagnosis not present

## 2022-03-08 DIAGNOSIS — Z7982 Long term (current) use of aspirin: Secondary | ICD-10-CM | POA: Diagnosis not present

## 2022-03-08 LAB — HEPATIC FUNCTION PANEL
ALT: 19 U/L (ref 0–44)
AST: 22 U/L (ref 15–41)
Albumin: 3.8 g/dL (ref 3.5–5.0)
Alkaline Phosphatase: 46 U/L (ref 38–126)
Bilirubin, Direct: <0.1 mg/dL (ref 0.0–0.2)
Total Bilirubin: 0.5 mg/dL (ref 0.3–1.2)
Total Protein: 6 g/dL — ABNORMAL LOW (ref 6.5–8.1)

## 2022-03-08 LAB — BASIC METABOLIC PANEL
Anion gap: 9 (ref 5–15)
BUN: 10 mg/dL (ref 8–23)
CO2: 22 mmol/L (ref 22–32)
Calcium: 9.9 mg/dL (ref 8.9–10.3)
Chloride: 107 mmol/L (ref 98–111)
Creatinine, Ser: 1.25 mg/dL — ABNORMAL HIGH (ref 0.44–1.00)
GFR, Estimated: 47 mL/min — ABNORMAL LOW (ref >60)
Glucose, Bld: 106 mg/dL — ABNORMAL HIGH (ref 70–99)
Potassium: 3.7 mmol/L (ref 3.5–5.1)
Sodium: 138 mmol/L (ref 135–145)

## 2022-03-08 LAB — CBC
HCT: 38.6 % (ref 36.0–46.0)
Hemoglobin: 12.7 g/dL (ref 12.0–15.0)
MCH: 31.2 pg (ref 26.0–34.0)
MCHC: 32.9 g/dL (ref 30.0–36.0)
MCV: 94.8 fL (ref 80.0–100.0)
Platelets: 184 10*3/uL (ref 150–400)
RBC: 4.07 MIL/uL (ref 3.87–5.11)
RDW: 13.4 % (ref 11.5–15.5)
WBC: 8.3 10*3/uL (ref 4.0–10.5)
nRBC: 0 % (ref 0.0–0.2)

## 2022-03-08 LAB — TROPONIN I (HIGH SENSITIVITY)
Troponin I (High Sensitivity): 4 ng/L (ref <18)
Troponin I (High Sensitivity): 4 ng/L (ref <18)

## 2022-03-08 LAB — LIPASE, BLOOD: Lipase: 35 U/L (ref 11–51)

## 2022-03-08 NOTE — ED Provider Notes (Signed)
[facility] EMERGENCY DEPARTMENT Provider Note   CSN: [id number] Arrival date & time: 03/08/22  1041     History  Chief Complaint  Patient presents with   Chest Pain    Rachel Black is a 71 y.o. female.  HPI 71 year old female history of type 2 diabetes, hyperlipidemia, hypertension, reported history of MI, presents today complaining of chest pain.  She states she has had chest pain intermittently since June 16.  She reports that this is her sixth visit to the ED.  She reports that the pain will resolve when she goes home and was told to follow-up with her cardiologist.  She says that she has not follow-up with her cardiologist because they are not open although she did not call the last week during the week.  The pain is worse in the morning and then generally improves through the day.  She cannot tell me when she was last completely pain-free.  He has some associated nausea and dyspnea.     Home Medications Prior to Admission medications   Medication Sig Start Date End Date Taking? Authorizing Provider  ACCU-CHEK AVIVA PLUS test strip USE AS INSTRUCTED ONCE A DAY. DX CODE E11.9 02/10/22   Mayer Masker, PA-C  ACCU-CHEK SOFTCLIX LANCETS lancets Use as instructed once a day.  DX Code: E11.9 09/21/17   Zola Button, Grayling Congress, DO  allopurinol (ZYLOPRIM) 300 MG tablet Take 1 tablet (300 mg total) by mouth 2 (two) times daily. 08/26/21   Mayer Masker, PA-C  aspirin 81 MG EC tablet Take 1 tablet (81 mg total) by mouth daily. 10/18/17   Arty Baumgartner, NP  Calcium Carb-Cholecalciferol (CALCIUM 600-D PO) Take 1 tablet by mouth 2 (two) times daily before a meal.    [provider]  dexlansoprazole (DEXILANT) 60 MG capsule TAKE 1 CAPSULE BY MOUTH 2 TIMES DAILY. Patient taking differently: Take 60 mg by mouth in the morning and at bedtime. 12/29/21   Esterwood, [name] S, PA-C  docusate sodium (COLACE) 100 MG capsule Take 1 capsule (100 mg total) by mouth 2 (two)  times daily as needed for mild constipation. 03/03/22   Charlynne Pander, MD  Emollient (UDDERLY SMOOTH) CREA Apply 1 application topically See admin instructions. Apply topically to skin folds as needed for rash/irritation    [provider]  fenofibrate 160 MG tablet TAKE 1 TABLET BY MOUTH EVERY DAY Patient taking differently: Take 160 mg by mouth daily. 02/16/22   Mayer Masker, PA-C  ferrous sulfate 325 (65 FE) MG tablet TAKE 1 TABLET BY MOUTH TWICE A DAY Patient taking differently: Take 325 mg by mouth 2 (two) times daily with a meal. 02/02/22   Zehr, Shanda Bumps D, PA-C  isosorbide mononitrate (IMDUR) 30 MG 24 hr tablet TAKE 1 TABLET BY MOUTH EVERY DAY Patient not taking: Reported on 03/05/2022 06/18/21   Rollene Rotunda, MD  JANUMET XR 50-500 MG TB24 TAKE 1 TABLET BY MOUTH EVERY DAY Patient not taking: Reported on 02/27/2022 12/26/21   Mayer Masker, PA-C  olmesartan (BENICAR) 5 MG tablet TAKE 1 TABLET (5 MG TOTAL) BY MOUTH DAILY. 02/02/22   Mayer Masker, PA-C  ondansetron (ZOFRAN) 4 MG tablet Take 1 tablet (4 mg total) by mouth every 8 (eight) hours as needed for nausea or vomiting. 02/27/22   Tegeler, Canary Brim, MD  pantoprazole (PROTONIX) 40 MG tablet Take 1 tablet (40 mg total) by mouth daily. 01/14/22   Hilarie Fredrickson, MD  polyethylene glycol [facility]) 17  g packet Take 17 g by mouth 2 (two) times daily. 03/03/22   Charlynne Pander, MD  rosuvastatin (CRESTOR) 40 MG tablet Take 1 tablet (40 mg total) by mouth daily. PLEASE MAKE APPOINTMENT WITH CARDIOLOGIST FOR FURTHER REFILLS Patient taking differently: Take 40 mg by mouth daily. 02/03/22   Rollene Rotunda, MD  Vitamin D, Ergocalciferol, (DRISDOL) 1.25 MG (50000 UNIT) CAPS capsule TAKE 1 CAPSULE BY MOUTH ONE TIME PER WEEK Patient taking differently: Take 50,000 Units by mouth every 7 (seven) days. Takes on Monday's 02/02/22   Mayer Masker, PA-C      Allergies    Other and Penicillins    Review of Systems   Review  of Systems  Physical Exam Updated Vital Signs BP 102/61   Pulse (!) 54   Temp 97.9 F (36.6 C) (Oral)   Resp 17   Ht 1.676 m (5\' 6" )   Wt 81.6 kg   SpO2 98%   BMI 29.04 kg/m  Physical Exam Vitals and nursing note reviewed.  Constitutional:      Appearance: She is well-developed.  HENT:     Head: Normocephalic and atraumatic.     Right Ear: External ear normal.     Left Ear: External ear normal.     Nose: Nose normal.  Eyes:     Conjunctiva/sclera: Conjunctivae normal.     Pupils: Pupils are equal, round, and reactive to light.  Neck:     Thyroid: No thyromegaly.     Vascular: No JVD.     Trachea: No tracheal deviation.  Cardiovascular:     Rate and Rhythm: Normal rate and regular rhythm.     Heart sounds: Normal heart sounds.  Pulmonary:     Effort: Pulmonary effort is normal.     Breath sounds: Normal breath sounds. No wheezing.  Abdominal:     General: Bowel sounds are normal.     Palpations: Abdomen is soft. There is no mass.     Tenderness: There is abdominal tenderness. There is no guarding.     Comments: Mild epigastric and right upper quadrant tenderness to palpation  Musculoskeletal:        General: Normal range of motion.     Cervical back: Normal range of motion and neck supple.  Lymphadenopathy:     Cervical: No cervical adenopathy.  Skin:    General: Skin is warm and dry.     Capillary Refill: Capillary refill takes less than 2 seconds.  Neurological:     Mental Status: She is alert and oriented to person, place, and time.     GCS: GCS eye subscore is 4. GCS verbal subscore is 5. GCS motor subscore is 6.     Cranial Nerves: No cranial nerve deficit.     Sensory: No sensory deficit.     Gait: Gait normal.     Deep Tendon Reflexes: Reflexes are normal and symmetric. Babinski sign absent on the right side. Babinski sign absent on the left side.     Reflex Scores:      Bicep reflexes are 2+ on the right side and 2+ on the left side.      Patellar  reflexes are 2+ on the right side and 2+ on the left side.    Comments: Strength is normal and equal throughout. Cranial nerves grossly intact. Patient fluent. No gross ataxia and patient able to ambulate without difficulty.  Psychiatric:        Behavior: Behavior normal.  Thought Content: Thought content normal.        Judgment: Judgment normal.     ED Results / Procedures / Treatments   Labs (all labs ordered are listed, but only abnormal results are displayed) Labs Reviewed  BASIC METABOLIC PANEL - Abnormal; Notable for the following components:      Result Value   Glucose, Bld 106 (*)    Creatinine, Ser 1.25 (*)    GFR, Estimated 47 (*)    All other components within normal limits  CBC  LIPASE, BLOOD  HEPATIC FUNCTION PANEL  TROPONIN I (HIGH SENSITIVITY)  TROPONIN I (HIGH SENSITIVITY)    EKG None  Radiology DG Chest 2 View  Result Date: 03/08/2022 CLINICAL DATA:  Chest pain. EXAM: CHEST - 2 VIEW COMPARISON:  03/07/2022 FINDINGS: The heart size and mediastinal contours are within normal limits. Both lungs are clear. The visualized skeletal structures are unremarkable. IMPRESSION: No active cardiopulmonary disease. Electronically Signed   By: Danae Orleans M.D.   On: 03/08/2022 12:08   DG Chest 2 View  Result Date: 03/07/2022 CLINICAL DATA:  Chest pain. EXAM: CHEST - 2 VIEW COMPARISON:  Chest x-Paulla Mcclaskey 03/05/2022 FINDINGS: The heart size and mediastinal contours are within normal limits. Both lungs are clear. The visualized skeletal structures are unremarkable. IMPRESSION: No active cardiopulmonary disease. Electronically Signed   By: Darliss Cheney M.D.   On: 03/07/2022 18:08    Procedures Procedures    Medications Ordered in ED Medications - No data to display  ED Course/ Medical Decision Making/ A&P                           Medical Decision Making 71 year old female history of coronary artery disease, diabetes, hyperlipidemia, hypertension who has been in the  ED multiple times over the past 2 weeks with chest pain diagnosed with colitis, palpitations, presents again today with chest pain.  Is unclear my exam whether the pain is in the upper abdomen or is in the chest.  EKG does not show acute ischemic changes and troponin is normal.  Patient has had pain ongoing now for several days making etiology of acute coronary syndrome and MI much less likely. Patient is hemodynamically stable.  Given the type of pain have a low index of suspicion for dissection. Patient reports that she has had her gallbladder out.  Her LFTs, bilirubin, and lipase are normal.  She does have some history of gastritis and reports that she is on Pepcid.  She has been followed by GI and has a upper endoscopy scheduled for next month.  We have discussed dietary changes. We have discussed the need for follow-up outpatient with cardiology We have discussed return precautions and she and her husband voiced understanding.  Amount and/or Complexity of Data Reviewed Labs: ordered. Decision-making details documented in ED Course. Radiology: ordered and independent interpretation performed. Decision-making details documented in ED Course.          Final Clinical Impression(s) / ED Diagnoses Final diagnoses:  Chest pain, unspecified type    Rx / DC Orders ED Discharge Orders     None         Margarita Grizzle, MD 03/08/22 1259

## 2022-03-09 ENCOUNTER — Encounter: Payer: Self-pay | Admitting: Cardiology

## 2022-03-09 ENCOUNTER — Telehealth: Payer: Self-pay | Admitting: Cardiology

## 2022-03-09 ENCOUNTER — Ambulatory Visit (INDEPENDENT_AMBULATORY_CARE_PROVIDER_SITE_OTHER): Payer: BC Managed Care – PPO

## 2022-03-09 ENCOUNTER — Ambulatory Visit: Payer: BC Managed Care – PPO | Admitting: Cardiology

## 2022-03-09 VITALS — BP 93/65 | HR 96 | Ht 66.0 in | Wt 175.2 lb

## 2022-03-09 DIAGNOSIS — R002 Palpitations: Secondary | ICD-10-CM

## 2022-03-09 MED ORDER — ISOSORBIDE MONONITRATE ER 30 MG PO TB24: 30.0000 mg | ORAL_TABLET | Freq: Every day | ORAL | 3 refills | Status: DC

## 2022-03-09 MED ORDER — ISOSORBIDE MONONITRATE ER 60 MG PO TB24: 60.0000 mg | ORAL_TABLET | Freq: Every day | ORAL | 3 refills | Status: DC

## 2022-03-11 DIAGNOSIS — R002 Palpitations: Secondary | ICD-10-CM | POA: Diagnosis not present

## 2022-03-12 ENCOUNTER — Other Ambulatory Visit: Payer: Self-pay

## 2022-03-12 ENCOUNTER — Emergency Department (HOSPITAL_COMMUNITY): Payer: BC Managed Care – PPO

## 2022-03-12 ENCOUNTER — Encounter (HOSPITAL_COMMUNITY): Payer: Self-pay | Admitting: Emergency Medicine

## 2022-03-12 ENCOUNTER — Emergency Department (HOSPITAL_COMMUNITY)
Admission: EM | Admit: 2022-03-12 | Discharge: 2022-03-12 | Disposition: A | Discharge status: Home / Self Care | Payer: BC Managed Care – PPO | Attending: Emergency Medicine | Admitting: Emergency Medicine

## 2022-03-12 ENCOUNTER — Telehealth: Payer: Self-pay | Admitting: Cardiology

## 2022-03-12 DIAGNOSIS — Z79899 Other long term (current) drug therapy: Secondary | ICD-10-CM | POA: Diagnosis not present

## 2022-03-12 DIAGNOSIS — R519 Headache, unspecified: Secondary | ICD-10-CM | POA: Insufficient documentation

## 2022-03-12 DIAGNOSIS — Z96653 Presence of artificial knee joint, bilateral: Secondary | ICD-10-CM | POA: Diagnosis not present

## 2022-03-12 DIAGNOSIS — R110 Nausea: Secondary | ICD-10-CM | POA: Insufficient documentation

## 2022-03-12 DIAGNOSIS — Z9861 Coronary angioplasty status: Secondary | ICD-10-CM | POA: Insufficient documentation

## 2022-03-12 DIAGNOSIS — I10 Essential (primary) hypertension: Secondary | ICD-10-CM | POA: Diagnosis not present

## 2022-03-12 DIAGNOSIS — I491 Atrial premature depolarization: Secondary | ICD-10-CM | POA: Diagnosis not present

## 2022-03-12 DIAGNOSIS — R002 Palpitations: Secondary | ICD-10-CM

## 2022-03-12 DIAGNOSIS — E119 Type 2 diabetes mellitus without complications: Secondary | ICD-10-CM | POA: Insufficient documentation

## 2022-03-12 DIAGNOSIS — Z7984 Long term (current) use of oral hypoglycemic drugs: Secondary | ICD-10-CM | POA: Diagnosis not present

## 2022-03-12 DIAGNOSIS — Z7982 Long term (current) use of aspirin: Secondary | ICD-10-CM | POA: Diagnosis not present

## 2022-03-12 DIAGNOSIS — I2510 Atherosclerotic heart disease of native coronary artery without angina pectoris: Secondary | ICD-10-CM | POA: Diagnosis not present

## 2022-03-12 LAB — CBC
HCT: 35.5 % — ABNORMAL LOW (ref 36.0–46.0)
Hemoglobin: 11.7 g/dL — ABNORMAL LOW (ref 12.0–15.0)
MCH: 31.3 pg (ref 26.0–34.0)
MCHC: 33 g/dL (ref 30.0–36.0)
MCV: 94.9 fL (ref 80.0–100.0)
Platelets: 190 10*3/uL (ref 150–400)
RBC: 3.74 MIL/uL — ABNORMAL LOW (ref 3.87–5.11)
RDW: 13.3 % (ref 11.5–15.5)
WBC: 8.8 10*3/uL (ref 4.0–10.5)
nRBC: 0 % (ref 0.0–0.2)

## 2022-03-12 LAB — BASIC METABOLIC PANEL
Anion gap: 10 (ref 5–15)
BUN: 16 mg/dL (ref 8–23)
CO2: 23 mmol/L (ref 22–32)
Calcium: 9.6 mg/dL (ref 8.9–10.3)
Chloride: 105 mmol/L (ref 98–111)
Creatinine, Ser: 1.26 mg/dL — ABNORMAL HIGH (ref 0.44–1.00)
GFR, Estimated: 46 mL/min — ABNORMAL LOW (ref >60)
Glucose, Bld: 114 mg/dL — ABNORMAL HIGH (ref 70–99)
Potassium: 3.8 mmol/L (ref 3.5–5.1)
Sodium: 138 mmol/L (ref 135–145)

## 2022-03-12 LAB — MAGNESIUM: Magnesium: 2 mg/dL (ref 1.7–2.4)

## 2022-03-12 LAB — TROPONIN I (HIGH SENSITIVITY)
Troponin I (High Sensitivity): 22 ng/L — ABNORMAL HIGH (ref <18)
Troponin I (High Sensitivity): 21 ng/L — ABNORMAL HIGH (ref <18)

## 2022-03-12 MED ORDER — SODIUM CHLORIDE 0.9 % IV BOLUS
1000.0000 mL | Freq: Once | INTRAVENOUS | Status: AC
  Administered 2022-03-12: 1000 mL via INTRAVENOUS

## 2022-03-12 MED ORDER — METOPROLOL SUCCINATE ER 25 MG PO TB24
25.0000 mg | ORAL_TABLET | Freq: Every day | ORAL | Status: DC
  Administered 2022-03-12: 25 mg via ORAL
  Filled 2022-03-12: qty 1

## 2022-03-12 MED ORDER — METOPROLOL SUCCINATE ER 25 MG PO TB24: 25.0000 mg | ORAL_TABLET | Freq: Every day | ORAL | 0 refills | Status: DC

## 2022-03-12 NOTE — Discharge Instructions (Addendum)
You were seen in the emergency department for palpitations.  As we discussed, your workup was reassuring. The cardiologist recommended you continue wearing your monitor and they decided to start you on a new medication called metoprolol. I have sent a prescription to your pharmacy.  Continue to monitor how you're doing and return to the ER for new or worsening symptoms.

## 2022-03-12 NOTE — Telephone Encounter (Signed)
Patient c/o Palpitations:  High priority if patient c/o lightheadedness, shortness of breath, or chest pain  How long have you had palpitations/irregular HR/ Afib? Since 02/27/22   Are you having the symptoms now? yes  Are you currently experiencing lightheadedness, SOB or CP? no  Do you have a history of afib (atrial fibrillation) or irregular heart rhythm? no  Have you checked your BP or HR? (document readings if available): no  Are you experiencing any other symptoms? Nausea  Patient called her Husband at work about her symptoms. Husband called but asked that we call her at home

## 2022-03-12 NOTE — Telephone Encounter (Signed)
Called patient back about message. Patient complaining of headache, stomach upset, nausea, palpitations, SOB with activity, a little bit of blurred vision, and racing heart. Patient stated she just does not feel well. Patient took her BP 130/77 HR 72. Informed patient that these readings are fine. Informed patient of signs and symptoms of stroke, but she stated she is only having a little bit of blurred vision. Patient has a headache that is probably from the Imdur. Patient stated the palpitations come and go. Patient stated she only gets SOB with getting up and moving. Asked patient if she has been around anyone who has been sick, patient stated she was in the ED, does that count? Patient has been to the ED multiple times in the past month.  Informed patient that most of her symptoms seem to be GI related, and encouraged her to call her PCP. Patient's husband come home and told him the same thing. After husband arrived home, patient stated she started having palpitations. Requested husband to take patient's BP again, at that time it was 86/47 HR 63. Encouraged patient to drink some fluids and have a small salty snack. Patient's husband decide to take BP again and numbers dropped to BP 78/47 and HR 61 . At the time husband stated patient is laying down for both sets of reading. Encouraged hydration and to call patient's PCP. Patient has appointment next month with GI. Informed patient that we are waiting to get monitor results after she wears it, and maybe that will let us know if there is anything going on with her heart, rhythm wise. Will send message to Dr. Percival Spanish for further advisement.

## 2022-03-12 NOTE — ED Provider Notes (Signed)
[facility] EMERGENCY DEPARTMENT Provider Note   CSN: [id number] Arrival date & time: 03/12/22  1204     History  Chief Complaint  Patient presents with   Palpitations    Rachel Black is a 71 y.o. female who presents the emergency department complaining of chest discomfort and palpitations.  Has been seen several times for similar symptoms.  Saw the cardiologist on 6/26, and they gave her a heart monitor.  States there is no aggravating factors, and the initial episode this morning came on upon waking.  Does not describe any pain, but says that she feels a galloping in her chest that is uncomfortable.  Also endorses a mild headache, nausea, and some shortness of breath with activity.  Patient had called her husband, and once he got home they checked her blood pressure and it was noted to be 86/47.  As part of these previous ER visits, she was diagnosed with colitis and given referral to gastroenterology.  She has a follow-up appointment next month.   Palpitations Associated symptoms: no chest pain, no cough, no nausea, no shortness of breath and no vomiting        Home Medications Prior to Admission medications   Medication Sig Start Date End Date Taking? Authorizing Provider  metoprolol succinate (TOPROL-XL) 25 MG 24 hr tablet Take 1 tablet (25 mg total) by mouth daily. 03/12/22  Yes Tristy Udovich T, PA-C  ACCU-CHEK AVIVA PLUS test strip USE AS INSTRUCTED ONCE A DAY. DX CODE E11.9 02/10/22   Lorrene Reid, PA-C  ACCU-CHEK SOFTCLIX LANCETS lancets Use as instructed once a day.  DX Code: E11.9 09/21/17   Carollee Herter, Alferd Apa, DO  allopurinol (ZYLOPRIM) 300 MG tablet Take 1 tablet (300 mg total) by mouth 2 (two) times daily. 08/26/21   Lorrene Reid, PA-C  aspirin 81 MG EC tablet Take 1 tablet (81 mg total) by mouth daily. 10/18/17   Cheryln Manly, NP  Calcium Carb-Cholecalciferol (CALCIUM 600-D PO) Take 1 tablet by mouth 2 (two) times daily before a meal.     [provider]  dexlansoprazole (DEXILANT) 60 MG capsule TAKE 1 CAPSULE BY MOUTH 2 TIMES DAILY. Patient taking differently: Take 60 mg by mouth in the morning and at bedtime. 12/29/21   Esterwood, [name] S, PA-C  docusate sodium (COLACE) 100 MG capsule Take 1 capsule (100 mg total) by mouth 2 (two) times daily as needed for mild constipation. 03/03/22   Drenda Freeze, MD  Emollient (UDDERLY SMOOTH) CREA Apply 1 application topically See admin instructions. Apply topically to skin folds as needed for rash/irritation    [provider]  fenofibrate 160 MG tablet TAKE 1 TABLET BY MOUTH EVERY DAY Patient taking differently: Take 160 mg by mouth daily. 02/16/22   Lorrene Reid, PA-C  ferrous sulfate 325 (65 FE) MG tablet TAKE 1 TABLET BY MOUTH TWICE A DAY Patient taking differently: Take 325 mg by mouth 2 (two) times daily with a meal. 02/02/22   Zehr, Laban Emperor, PA-C  isosorbide mononitrate (IMDUR) 60 MG 24 hr tablet Take 1 tablet (60 mg total) by mouth daily. 03/09/22   Minus Breeding, MD  JANUMET XR 50-500 MG TB24 TAKE 1 TABLET BY MOUTH EVERY DAY Patient not taking: Reported on 03/09/2022 12/26/21   Lorrene Reid, PA-C  olmesartan (BENICAR) 5 MG tablet TAKE 1 TABLET (5 MG TOTAL) BY MOUTH DAILY. 02/02/22   Lorrene Reid, PA-C  ondansetron (ZOFRAN) 4 MG tablet Take 1 tablet (4 mg total)  by mouth every 8 (eight) hours as needed for nausea or vomiting. 02/27/22   Tegeler, Gwenyth Allegra, MD  pantoprazole (PROTONIX) 40 MG tablet Take 1 tablet (40 mg total) by mouth daily. 01/14/22   Irene Shipper, MD  polyethylene glycol (MIRALAX / GLYCOLAX) 17 g packet Take 17 g by mouth 2 (two) times daily. 03/03/22   Drenda Freeze, MD  rosuvastatin (CRESTOR) 40 MG tablet Take 1 tablet (40 mg total) by mouth daily. PLEASE MAKE APPOINTMENT WITH CARDIOLOGIST FOR FURTHER REFILLS Patient taking differently: Take 40 mg by mouth daily. 02/03/22   Minus Breeding, MD  Vitamin D, Ergocalciferol, (DRISDOL)  1.25 MG (50000 UNIT) CAPS capsule TAKE 1 CAPSULE BY MOUTH ONE TIME PER WEEK Patient taking differently: Take 50,000 Units by mouth every 7 (seven) days. Takes on Monday's 02/02/22   Lorrene Reid, PA-C      Allergies    Other and Penicillins    Review of Systems   Review of Systems  Constitutional:  Negative for fever.  Respiratory:  Negative for cough and shortness of breath.   Cardiovascular:  Positive for palpitations. Negative for chest pain.  Gastrointestinal:  Positive for abdominal pain. Negative for constipation, diarrhea, nausea and vomiting.  All other systems reviewed and are negative.   Physical Exam Updated Vital Signs BP 113/69   Pulse 62   Temp 98 F (36.7 C) (Oral)   Resp 19   Ht 5' 6"  (1.676 m)   SpO2 100%   BMI 28.28 kg/m  Physical Exam Vitals and nursing note reviewed.  Constitutional:      Appearance: Normal appearance.  HENT:     Head: Normocephalic and atraumatic.  Eyes:     Conjunctiva/sclera: Conjunctivae normal.  Cardiovascular:     Rate and Rhythm: Normal rate and regular rhythm.  Pulmonary:     Effort: Pulmonary effort is normal. No respiratory distress.     Breath sounds: Normal breath sounds.  Abdominal:     General: There is no distension.     Palpations: Abdomen is soft.     Tenderness: There is no abdominal tenderness.  Skin:    General: Skin is warm and dry.  Neurological:     General: No focal deficit present.     Mental Status: She is alert.     ED Results / Procedures / Treatments   Labs (all labs ordered are listed, but only abnormal results are displayed) Labs Reviewed  BASIC METABOLIC PANEL - Abnormal; Notable for the following components:      Result Value   Glucose, Bld 114 (*)    Creatinine, Ser 1.26 (*)    GFR, Estimated 46 (*)    All other components within normal limits  CBC - Abnormal; Notable for the following components:   RBC 3.74 (*)    Hemoglobin 11.7 (*)    HCT 35.5 (*)    All other components  within normal limits  TROPONIN I (HIGH SENSITIVITY) - Abnormal; Notable for the following components:   Troponin I (High Sensitivity) 22 (*)    All other components within normal limits  TROPONIN I (HIGH SENSITIVITY) - Abnormal; Notable for the following components:   Troponin I (High Sensitivity) 21 (*)    All other components within normal limits  MAGNESIUM    EKG EKG Interpretation  Date/Time:  Thursday March 12 2022 13:59:20 EDT Ventricular Rate:  62 PR Interval:  162 QRS Duration: 119 QT Interval:  411 QTC Calculation: 418 R Axis:   -84  Text Interpretation: Sinus rhythm Left anterior fascicular block  No significant changes from prior tracing Confirmed by Octaviano Glow [phone]) on 03/12/2022 6:51:16 PM  Radiology DG Chest 2 View  Result Date: 03/12/2022 CLINICAL DATA:  Chest pain EXAM: CHEST - 2 VIEW COMPARISON:  Chest radiograph dated March 08, 2022 FINDINGS: The heart size and mediastinal contours are within normal limits. Both lungs are clear. The visualized skeletal structures are unremarkable. Battery device and a loop recorder in the left anterior chest wall. IMPRESSION: No active cardiopulmonary disease. Electronically Signed   By: Keane Police D.O.   On: 03/12/2022 12:52    Procedures Procedures    Medications Ordered in ED Medications  sodium chloride 0.9 % bolus 1,000 mL (0 mLs Intravenous Stopped 03/12/22 1757)    ED Course/ Medical Decision Making/ A&P                           Medical Decision Making Amount and/or Complexity of Data Reviewed Labs: ordered. Radiology: ordered.  Risk Prescription drug management.  This patient is a 71 y.o. female who presents to the ED for concern of chest pain, this involves an extensive number of treatment options, and is a complaint that carries with it a high risk of complications and morbidity. The emergent differential diagnosis prior to evaluation includes, but is not limited to,  ACS, pericarditis, aortic  dissection, PE, pneumothorax, esophageal spasm or rupture, chronic angina, valvular disease, cardiomyopathy, myocarditis, pulmonary HTN, pneumonia, bronchitis, GERD, reflux/PUD, biliary disease, pancreatitis, disk disease, costochondritis, anxiety or panic attack. This is not an exhaustive differential.   Past Medical History / Co-morbidities / Social History: Diabetes, HLD, HTN, NSTEMI in 2019, CAD  Additional history: Chart reviewed. Pertinent results include: This is patient's seventh visit for similar symptoms.  She just saw cardiology on 6/26.  They reviewed her lab work and cardiac monitoring, and decided to place her on a Holter monitor.  During this visit she was also diagnosed with colitis, has a follow-up appointment with gastroenterology in July.  Physical Exam: Physical exam performed. The pertinent findings include: Borderline soft blood pressures. In no acute distress. Normal oxygen saturations and clear lungs. Heart regular rate and rhythm.   Lab Tests: I ordered, and personally interpreted labs.  The pertinent results include:  No leukocytosis, hemoglobin stable compared to prior. Electrolytes WNL, kidney function stable compared to prior. Initial troponin 22, delta troponin 21.    Imaging Studies: I ordered imaging studies including chest x-ray. I independently visualized and interpreted imaging which showed no acute cardiopulmonary abnormalities. I agree with the radiologist interpretation.   Cardiac Monitoring:  The patient was maintained on a cardiac monitor.  My attending physician Dr. Langston Masker viewed and interpreted the cardiac monitored which showed an underlying rhythm of: sinus rhythm.    Medications: I ordered medication including IV fluids  for borderline hypotension. Reevaluation of the patient after these medicines showed that the patient improved. I have reviewed the patients home medicines and have made adjustments as needed.  Consultations Obtained: I requested  consultation with the cardiologist Dr Harl Bowie. She was able to come evaluate the patient and recommended patient continue wearing heart monitor and start on metoprolol succinate 25 mg daily. Concluded patient likely experiencing symptoms related to scattered PAC's.    Disposition: After consideration of the diagnostic results and the patients response to treatment, I feel that emergency department workup does not suggest an emergent condition requiring admission or immediate intervention beyond  what has been performed at this time. The plan is: discharge to home with prescription for metoprolol and cardiology follow-up. The patient is safe for discharge and has been instructed to return immediately for worsening symptoms, change in symptoms or any other concerns.  Final Clinical Impression(s) / ED Diagnoses Final diagnoses:  Heart palpitations  Premature atrial contraction    Rx / DC Orders ED Discharge Orders          Ordered    metoprolol succinate (TOPROL-XL) 25 MG 24 hr tablet  Daily        03/12/22 1855           Portions of this report may have been transcribed using voice recognition software. Every effort was made to ensure accuracy; however, inadvertent computerized transcription errors may be present.    Estill Cotta 03/13/22 0223    Davonna Belling, MD 03/13/22 [phone]

## 2022-03-12 NOTE — Consult Note (Signed)
Cardiology Consultation:   Patient ID: Rachel Black MRN: [id number]; DOB: 1950/11/06  Admit date: 03/12/2022 Date of Consult: 03/12/2022  PCP:  Lorrene Reid, PA-C   [facility] Providers Cardiologist:  Minus Breeding, MD  Cardiology APP:  Lonn Georgia, PA-C       Patient Profile:   Rachel Black is a 71 y.o. female who presents for follow-up of coronary artery disease.  She was admitted in February 2019.  She had a non-STEMI.  She had an occluded second marginal.  She was managed medically.  She has had a cryptogenic stroke.  She has an implanted loop recorder.  She did have a PFO on TEE. No hx of afib  03/12/2022 for the evaluation of palpitations at the request of Dr. Alvino Chapel.  History of Present Illness:   [name] has had frequent multiple visits of the last view weeks for non acute abdominal pain and nausea. She reports persistent palpitations. She recently saw Dr. Percival Spanish 3 days ago. Plan is for a ziopatch which is pending. During his visit, she noted she was having chest pain that last all day. Not exacerbated with activity. She had no signs of CHF. She had a cath in 2019 that showed CTO of the 2nd Ember Gottwald of OM2. Here she is HDS. K 3.8, Mg 2.0. Bps are mildly soft to normal. She has PACs on her telemetry.   Past Medical History:  Diagnosis Date   Arthritis    PAIN AND OA BOTH KNEES AND SHOULDERS AND ELBOWS AND WRIST   Blood transfusion without reported diagnosis 2018   after cholecystectomy   Broken foot, right, closed, initial encounter 09/15/2021   no surgery needed, fell at home and went to ED   Diabetes mellitus    ORAL MEDICATION   GERD (gastroesophageal reflux disease)    Gout    NO RECENT FLARE UPS   H/O: rheumatic fever    AS A CHILD - NO KNOWN HEART MURMUR OR HEART PROBLEMS   Heart attack ([place])    Hyperlipidemia    Hypertension    Stroke ([place]) 03/2017    Past Surgical History:  Procedure Laterality Date   ABDOMINAL HYSTERECTOMY      CESAREAN SECTION     CHOLECYSTECTOMY N/A 12/09/2016   Procedure: LAPAROSCOPIC CHOLECYSTECTOMY WITH INTRAOPERATIVE CHOLANGIOGRAM;  Surgeon: Stark Klein, MD;  Location: [facility];  Service: General;  Laterality: N/A;   COLONOSCOPY     COLONOSCOPY WITH PROPOFOL N/A 01/30/2013   Procedure: COLONOSCOPY WITH PROPOFOL;  Surgeon: Irene Shipper, MD;  Location: [facility];  Service: Endoscopy;  Laterality: N/A;   ERCP N/A 12/08/2016   Procedure: ENDOSCOPIC RETROGRADE CHOLANGIOPANCREATOGRAPHY (ERCP);  Surgeon: Ladene Artist, MD;  Location: [facility];  Service: Endoscopy;  Laterality: N/A;   ESOPHAGOGASTRODUODENOSCOPY (EGD) WITH PROPOFOL N/A 01/30/2013   Procedure: ESOPHAGOGASTRODUODENOSCOPY (EGD) WITH PROPOFOL;  Surgeon: Irene Shipper, MD;  Location: [facility];  Service: Endoscopy;  Laterality: N/A;   LEFT HEART CATH AND CORONARY ANGIOGRAPHY N/A 10/18/2017   Procedure: LEFT HEART CATH AND CORONARY ANGIOGRAPHY;  Surgeon: Martinique, Peter M, MD;  Location: [facility];  Service: Cardiovascular;  Laterality: N/A;   LOOP RECORDER INSERTION N/A 03/18/2017   Procedure: Loop Recorder Insertion;  Surgeon: Constance Haw, MD;  Location: [facility];  Service: Cardiovascular;  Laterality: N/A;   PARTIAL HYSTERECTOMY     TEE WITHOUT CARDIOVERSION N/A 03/16/2017   Procedure: TRANSESOPHAGEAL ECHOCARDIOGRAM (TEE);  Surgeon: Thayer Headings, MD;  Location:  [facility];  Service: Cardiovascular;  Laterality: N/A;   TOTAL KNEE ARTHROPLASTY Right 04/20/2014   Procedure: RIGHT TOTAL KNEE ARTHROPLASTY CONVERTED TO RIGHT KNEE REIMPLANTATION;  Surgeon: Mcarthur Rossetti, MD;  Location: [facility];  Service: Orthopedics;  Laterality: Right;   TOTAL KNEE ARTHROPLASTY Left 08/23/2015   Procedure: LEFT TOTAL KNEE ARTHROPLASTY;  Surgeon: Mcarthur Rossetti, MD;  Location: [facility];  Service: Orthopedics;  Laterality: Left;   VENTRAL HERNIA REPAIR     x2     Home Medications:  Prior to Admission  medications   Medication Sig Start Date End Date Taking? Authorizing Provider  ACCU-CHEK AVIVA PLUS test strip USE AS INSTRUCTED ONCE A DAY. DX CODE E11.9 02/10/22   Lorrene Reid, PA-C  ACCU-CHEK SOFTCLIX LANCETS lancets Use as instructed once a day.  DX Code: E11.9 09/21/17   Carollee Herter, Alferd Apa, DO  allopurinol (ZYLOPRIM) 300 MG tablet Take 1 tablet (300 mg total) by mouth 2 (two) times daily. 08/26/21   Lorrene Reid, PA-C  aspirin 81 MG EC tablet Take 1 tablet (81 mg total) by mouth daily. 10/18/17   Cheryln Manly, NP  Calcium Carb-Cholecalciferol (CALCIUM 600-D PO) Take 1 tablet by mouth 2 (two) times daily before a meal.    [provider]  dexlansoprazole (DEXILANT) 60 MG capsule TAKE 1 CAPSULE BY MOUTH 2 TIMES DAILY. Patient taking differently: Take 60 mg by mouth in the morning and at bedtime. 12/29/21   Esterwood, [name] S, PA-C  docusate sodium (COLACE) 100 MG capsule Take 1 capsule (100 mg total) by mouth 2 (two) times daily as needed for mild constipation. 03/03/22   Drenda Freeze, MD  Emollient (UDDERLY SMOOTH) CREA Apply 1 application topically See admin instructions. Apply topically to skin folds as needed for rash/irritation    [provider]  fenofibrate 160 MG tablet TAKE 1 TABLET BY MOUTH EVERY DAY Patient taking differently: Take 160 mg by mouth daily. 02/16/22   Lorrene Reid, PA-C  ferrous sulfate 325 (65 FE) MG tablet TAKE 1 TABLET BY MOUTH TWICE A DAY Patient taking differently: Take 325 mg by mouth 2 (two) times daily with a meal. 02/02/22   Zehr, Laban Emperor, PA-C  isosorbide mononitrate (IMDUR) 60 MG 24 hr tablet Take 1 tablet (60 mg total) by mouth daily. 03/09/22   Minus Breeding, MD  JANUMET XR 50-500 MG TB24 TAKE 1 TABLET BY MOUTH EVERY DAY Patient not taking: Reported on 03/09/2022 12/26/21   Lorrene Reid, PA-C  olmesartan (BENICAR) 5 MG tablet TAKE 1 TABLET (5 MG TOTAL) BY MOUTH DAILY. 02/02/22   Lorrene Reid, PA-C  ondansetron (ZOFRAN) 4  MG tablet Take 1 tablet (4 mg total) by mouth every 8 (eight) hours as needed for nausea or vomiting. 02/27/22   Tegeler, Gwenyth Allegra, MD  pantoprazole (PROTONIX) 40 MG tablet Take 1 tablet (40 mg total) by mouth daily. 01/14/22   Irene Shipper, MD  polyethylene glycol (MIRALAX / GLYCOLAX) 17 g packet Take 17 g by mouth 2 (two) times daily. 03/03/22   Drenda Freeze, MD  rosuvastatin (CRESTOR) 40 MG tablet Take 1 tablet (40 mg total) by mouth daily. PLEASE MAKE APPOINTMENT WITH CARDIOLOGIST FOR FURTHER REFILLS Patient taking differently: Take 40 mg by mouth daily. 02/03/22   Minus Breeding, MD  Vitamin D, Ergocalciferol, (DRISDOL) 1.25 MG (50000 UNIT) CAPS capsule TAKE 1 CAPSULE BY MOUTH ONE TIME PER WEEK Patient taking differently: Take 50,000 Units by mouth every 7 (seven) days. Takes on Monday's  02/02/22   Lorrene Reid, PA-C    Inpatient Medications: Scheduled Meds:  Continuous Infusions:  PRN Meds:   Allergies:    Allergies  Allergen Reactions   Other Other (See Comments)    Some BANDAIDS cause skin irritation    Penicillins Itching    Has patient had a PCN reaction causing immediate rash, facial/tongue/throat swelling, SOB or lightheadedness with hypotension: No Has patient had a PCN reaction causing severe rash involving mucus membranes or skin necrosis: No Has patient had a PCN reaction that required hospitalization No Has patient had a PCN reaction occurring within the last 10 years: No If all of the above answers are "NO", then may proceed with Cephalosporin use.     Social History:   Social History   Socioeconomic History   Marital status: Married    Spouse name: [name]   Number of children: 2   Years of education: Not on file   Highest education level: Not on file  Occupational History   Occupation: housewife    Employer: UNEMPLOYED  Tobacco Use   Smoking status: Never   Smokeless tobacco: Never  Vaping Use   Vaping Use: Never used  Substance and Sexual  Activity   Alcohol use: Not Currently    Comment: rarely in the past   Drug use: Never   Sexual activity: Yes    Partners: Male  Other Topics Concern   Not on file  Social History Narrative   No exercise secondary to knee pain   Social Determinants of Health   Financial Resource Strain: Not on file  Food Insecurity: Not on file  Transportation Needs: Not on file  Physical Activity: Not on file  Stress: Not on file  Social Connections: Not on file  Intimate Partner Violence: Not on file    Family History:    Family History  Problem Relation Age of Onset   Diabetes Mother    Hypertension Mother        entire family   Breast cancer Mother        diagnosed in her 65's   Stroke Father        CVA   Hyperlipidemia Father        entire family   Diabetes Father    Heart disease Father        No details.  Not at an early age.     Crohn's disease Son    Irritable bowel syndrome Son    Colon cancer Neg Hx    Colon polyps Neg Hx    Esophageal cancer Neg Hx    Stomach cancer Neg Hx    Rectal cancer Neg Hx      ROS:  Please see the history of present illness.   All other ROS reviewed and negative.     Physical Exam/Data:   Vitals:   03/12/22 1615 03/12/22 1700 03/12/22 1745 03/12/22 1815  BP: (!) 102/57 124/62 116/65 (!) 129/52  Pulse: 65 69 66 (!) 28  Resp: 12 15 20 18   Temp:      TempSrc:      SpO2: 100% 97% 99% 99%  Height:       No intake or output data in the 24 hours ending 03/12/22 1824    03/09/2022   11:43 AM 03/08/2022   11:22 AM 02/27/2022   12:11 PM  Last 3 Weights  Weight (lbs) 175 lb 3.2 oz 179 lb 14.3 oz 180 lb  Weight (kg) 79.47 kg 81.6 kg 81.647  kg     Body mass index is 28.28 kg/m.  General:  Well nourished, well developed, in no acute distress HEENT: normal Neck: no JVD Vascular: No carotid bruits; Distal pulses 2+ bilaterally Cardiac:  normal S1, S2; RRR; no murmur  Lungs:  clear to auscultation bilaterally, no wheezing, rhonchi or  rales  Abd: soft, nontender, no hepatomegaly  Ext: no edema Musculoskeletal:  No deformities, BUE and BLE strength normal and equal Skin: warm and dry  Neuro:  CNs 2-12 intact, no focal abnormalities noted Psych:  Normal affect   EKG:  The EKG was personally reviewed and demonstrates:  NSR, LAFB, poor R wave progression  Telemetry:  Telemetry was personally reviewed and demonstrates:  NSR, PACs  Relevant CV Studies: TTE 10/17/2017  - Left ventricle: The cavity size was normal. There was mild    concentric hypertrophy. Systolic function was normal. The    estimated ejection fraction was in the range of 55% to 60%. Wall    motion was normal; there were no regional wall motion    abnormalities. Doppler parameters are consistent with abnormal    left ventricular relaxation (grade 1 diastolic dysfunction).  - Aortic valve: Transvalvular velocity was within the normal range.    There was no stenosis. There was no regurgitation.  - Mitral valve: Transvalvular velocity was within the normal range.    There was no evidence for stenosis. There was no regurgitation.  - Right ventricle: The cavity size was normal. Wall thickness was    normal. Systolic function was normal.  - Atrial septum: No defect or patent foramen ovale was identified    by color flow Doppler.  - Tricuspid valve: There was mild regurgitation.  - Pulmonary arteries: Systolic pressure was within the normal    range. PA peak pressure: 21 mm Hg (S).   LHC 10/18/2017  Lat 2nd Mrg lesion is 100% stenosed. LV end diastolic pressure is normal.   1. Single vessel occlusive CAD with 100% occlusion of side Kohner Orlick of the second OM 2. Otherwise normal coronary anatomy 3. Normal LVEDP  Laboratory Data:  High Sensitivity Troponin:   Recent Labs  Lab 03/07/22 1932 03/08/22 1110 03/08/22 1302 03/12/22 1224 03/12/22 1517  TROPONINIHS 5 4 4  22* 21*     Chemistry Recent Labs  Lab 03/07/22 1724 03/08/22 1110 03/12/22 1224  03/12/22 1517  NA 138 138 138  --   K 3.9 3.7 3.8  --   CL 108 107 105  --   CO2 23 22 23   --   GLUCOSE 109* 106* 114*  --   BUN 10 10 16   --   CREATININE 1.32* 1.25* 1.26*  --   CALCIUM 9.5 9.9 9.6  --   MG  --   --   --  2.0  GFRNONAA 44* 47* 46*  --   ANIONGAP 7 9 10   --     Recent Labs  Lab 03/08/22 1110  PROT 6.0*  ALBUMIN 3.8  AST 22  ALT 19  ALKPHOS 46  BILITOT 0.5   Lipids No results for input(s): "CHOL", "TRIG", "HDL", "LABVLDL", "LDLCALC", "CHOLHDL" in the last 168 hours.  Hematology Recent Labs  Lab 03/07/22 1724 03/08/22 1110 03/12/22 1224  WBC 7.9 8.3 8.8  RBC 3.83* 4.07 3.74*  HGB 12.3 12.7 11.7*  HCT 36.8 38.6 35.5*  MCV 96.1 94.8 94.9  MCH 32.1 31.2 31.3  MCHC 33.4 32.9 33.0  RDW 13.4 13.4 13.3  PLT 197 184 190  Thyroid No results for input(s): "TSH", "FREET4" in the last 168 hours.  BNPNo results for input(s): "BNP", "PROBNP" in the last 168 hours.  DDimer No results for input(s): "DDIMER" in the last 168 hours.   Radiology/Studies:  DG Chest 2 View  Result Date: 03/12/2022 CLINICAL DATA:  Chest pain EXAM: CHEST - 2 VIEW COMPARISON:  Chest radiograph dated March 08, 2022 FINDINGS: The heart size and mediastinal contours are within normal limits. Both lungs are clear. The visualized skeletal structures are unremarkable. Battery device and a loop recorder in the left anterior chest wall. IMPRESSION: No active cardiopulmonary disease. Electronically Signed   By: Keane Police D.O.   On: 03/12/2022 12:52     Assessment and Plan:   Palpitations: She has PACs on her tele and is symptomatic from these. Will start low dose BB. I recommended cutting back on caffeine, increasing hydration with electrolytes. She does not have a cardiac reason for admission. - start metoprolol 25 mg XL - she has a ziopatch - Will sign off; thank you for the consult  Ischemic Cardiac Dx: CTO of side Aadan Chenier of the second OM. Continue recent increased imdur 60 mg daily.  Will start BB per above.   HTN: continue olmesartan 5 mg daily    Risk Assessment/Risk Scores:      For questions or updates, please contact [place] Please consult [email or web address] for contact info under    Signed, Janina Mayo, MD  03/12/2022 6:24 PM

## 2022-03-12 NOTE — ED Triage Notes (Signed)
Patient complains of a throbbing sensation in her chest that has occurred three time today, denies any activity that seems to trigger pain. The first episode occurred on waking this morning. Patient is alert, oriented, and in no apparent distress at this time.

## 2022-03-16 ENCOUNTER — Other Ambulatory Visit: Payer: Self-pay

## 2022-03-16 ENCOUNTER — Encounter (HOSPITAL_COMMUNITY): Payer: Self-pay | Admitting: Pharmacy Technician

## 2022-03-16 ENCOUNTER — Emergency Department (HOSPITAL_COMMUNITY)
Admission: EM | Admit: 2022-03-16 | Discharge: 2022-03-16 | Disposition: A | Discharge status: Home / Self Care | Payer: BC Managed Care – PPO | Attending: Emergency Medicine | Admitting: Emergency Medicine

## 2022-03-16 ENCOUNTER — Emergency Department (HOSPITAL_COMMUNITY): Payer: BC Managed Care – PPO

## 2022-03-16 DIAGNOSIS — E119 Type 2 diabetes mellitus without complications: Secondary | ICD-10-CM | POA: Diagnosis not present

## 2022-03-16 DIAGNOSIS — R1013 Epigastric pain: Secondary | ICD-10-CM | POA: Diagnosis not present

## 2022-03-16 DIAGNOSIS — R109 Unspecified abdominal pain: Secondary | ICD-10-CM

## 2022-03-16 DIAGNOSIS — R001 Bradycardia, unspecified: Secondary | ICD-10-CM | POA: Insufficient documentation

## 2022-03-16 DIAGNOSIS — Z96653 Presence of artificial knee joint, bilateral: Secondary | ICD-10-CM | POA: Diagnosis not present

## 2022-03-16 DIAGNOSIS — K581 Irritable bowel syndrome with constipation: Secondary | ICD-10-CM | POA: Diagnosis present

## 2022-03-16 DIAGNOSIS — Z7982 Long term (current) use of aspirin: Secondary | ICD-10-CM | POA: Insufficient documentation

## 2022-03-16 DIAGNOSIS — I10 Essential (primary) hypertension: Secondary | ICD-10-CM | POA: Diagnosis not present

## 2022-03-16 DIAGNOSIS — R7989 Other specified abnormal findings of blood chemistry: Secondary | ICD-10-CM | POA: Insufficient documentation

## 2022-03-16 DIAGNOSIS — Z7984 Long term (current) use of oral hypoglycemic drugs: Secondary | ICD-10-CM | POA: Diagnosis not present

## 2022-03-16 DIAGNOSIS — R0789 Other chest pain: Secondary | ICD-10-CM | POA: Insufficient documentation

## 2022-03-16 DIAGNOSIS — K5900 Constipation, unspecified: Secondary | ICD-10-CM | POA: Insufficient documentation

## 2022-03-16 DIAGNOSIS — Z79899 Other long term (current) drug therapy: Secondary | ICD-10-CM | POA: Insufficient documentation

## 2022-03-16 DIAGNOSIS — R079 Chest pain, unspecified: Secondary | ICD-10-CM | POA: Diagnosis present

## 2022-03-16 DIAGNOSIS — I2510 Atherosclerotic heart disease of native coronary artery without angina pectoris: Secondary | ICD-10-CM | POA: Insufficient documentation

## 2022-03-16 DIAGNOSIS — R1084 Generalized abdominal pain: Secondary | ICD-10-CM | POA: Diagnosis present

## 2022-03-16 DIAGNOSIS — Z20822 Contact with and (suspected) exposure to covid-19: Secondary | ICD-10-CM | POA: Diagnosis not present

## 2022-03-16 DIAGNOSIS — D649 Anemia, unspecified: Secondary | ICD-10-CM | POA: Diagnosis not present

## 2022-03-16 HISTORY — DX: Patent foramen ovale: Q21.12

## 2022-03-16 LAB — URINALYSIS, ROUTINE W REFLEX MICROSCOPIC
Bilirubin Urine: NEGATIVE
Glucose, UA: NEGATIVE mg/dL
Hgb urine dipstick: NEGATIVE
Ketones, ur: NEGATIVE mg/dL
Leukocytes,Ua: NEGATIVE
Nitrite: NEGATIVE
Protein, ur: NEGATIVE mg/dL
Specific Gravity, Urine: 1.009 (ref 1.005–1.030)
pH: 7 (ref 5.0–8.0)

## 2022-03-16 LAB — CBC
HCT: 35.1 % — ABNORMAL LOW (ref 36.0–46.0)
Hemoglobin: 11.5 g/dL — ABNORMAL LOW (ref 12.0–15.0)
MCH: 31.2 pg (ref 26.0–34.0)
MCHC: 32.8 g/dL (ref 30.0–36.0)
MCV: 95.1 fL (ref 80.0–100.0)
Platelets: 209 10*3/uL (ref 150–400)
RBC: 3.69 MIL/uL — ABNORMAL LOW (ref 3.87–5.11)
RDW: 13.3 % (ref 11.5–15.5)
WBC: 6.3 10*3/uL (ref 4.0–10.5)
nRBC: 0 % (ref 0.0–0.2)

## 2022-03-16 LAB — COMPREHENSIVE METABOLIC PANEL
ALT: 15 U/L (ref 0–44)
AST: 19 U/L (ref 15–41)
Albumin: 3.4 g/dL — ABNORMAL LOW (ref 3.5–5.0)
Alkaline Phosphatase: 47 U/L (ref 38–126)
Anion gap: 7 (ref 5–15)
BUN: 11 mg/dL (ref 8–23)
CO2: 26 mmol/L (ref 22–32)
Calcium: 9.5 mg/dL (ref 8.9–10.3)
Chloride: 107 mmol/L (ref 98–111)
Creatinine, Ser: 1.24 mg/dL — ABNORMAL HIGH (ref 0.44–1.00)
GFR, Estimated: 47 mL/min — ABNORMAL LOW (ref >60)
Glucose, Bld: 105 mg/dL — ABNORMAL HIGH (ref 70–99)
Potassium: 3.9 mmol/L (ref 3.5–5.1)
Sodium: 140 mmol/L (ref 135–145)
Total Bilirubin: 0.4 mg/dL (ref 0.3–1.2)
Total Protein: 5.7 g/dL — ABNORMAL LOW (ref 6.5–8.1)

## 2022-03-16 LAB — LIPASE, BLOOD: Lipase: 41 U/L (ref 11–51)

## 2022-03-16 LAB — TROPONIN I (HIGH SENSITIVITY)
Troponin I (High Sensitivity): 9 ng/L (ref <18)
Troponin I (High Sensitivity): 8 ng/L (ref <18)

## 2022-03-16 LAB — RESP PANEL BY RT-PCR (FLU A&B, COVID) ARPGX2
Influenza A by PCR: NEGATIVE
Influenza B by PCR: NEGATIVE
SARS Coronavirus 2 by RT PCR: NEGATIVE

## 2022-03-16 LAB — CBG MONITORING, ED: Glucose-Capillary: 95 mg/dL (ref 70–99)

## 2022-03-16 MED ORDER — POLYETHYLENE GLYCOL 3350 17 G PO PACK: 17.0000 g | PACK | Freq: Every day | ORAL | 0 refills | Status: DC

## 2022-03-16 MED ORDER — BISACODYL 5 MG PO TBEC: 5.0000 mg | DELAYED_RELEASE_TABLET | Freq: Every evening | ORAL | 0 refills | Status: AC

## 2022-03-16 MED ORDER — SUCRALFATE 1 G PO TABS: 1.0000 g | ORAL_TABLET | Freq: Three times a day (TID) | ORAL | 0 refills | Status: DC

## 2022-03-16 MED ORDER — IOHEXOL 350 MG/ML SOLN
100.0000 mL | Freq: Once | INTRAVENOUS | Status: AC | PRN
  Administered 2022-03-16: 100 mL via INTRAVENOUS

## 2022-03-16 MED ORDER — DOCUSATE SODIUM 100 MG PO CAPS: 100.0000 mg | ORAL_CAPSULE | Freq: Two times a day (BID) | ORAL | 0 refills | Status: DC | PRN

## 2022-03-16 MED ORDER — SODIUM CHLORIDE 0.9 % IV BOLUS
500.0000 mL | Freq: Once | INTRAVENOUS | Status: AC
  Administered 2022-03-16: 500 mL via INTRAVENOUS

## 2022-03-16 NOTE — ED Notes (Signed)
Provider at bedside

## 2022-03-16 NOTE — Consult Note (Signed)
ER Consult Note   Patient: Rachel Black KPT:[id number] DOB: [birth year] DOA: 03/16/2022 DOS: the patient was seen and examined on 03/16/2022 PCP: Lorrene Reid, PA-C  Patient coming from: Home - lives with husband; NOK: Husband, [phone]   Chief Complaint: Malaise, myalgias  HPI: Rachel Black is a 71 y.o. female with medical history significant of DM; HTN; HLD; CAD (2019); and CVA (2018) presenting with malaise. She has been seen multiple times recently: 6/16, ER - palpitations, n/v/d, dehydration 6/18, ER - same symptoms, ?mild colitis, given fluids 6/20, ER - palpitations, abdominal pain, constipation; colitis resolved on CT 6/22, ER - abdominal pain, nausea 6/24, ER - chest/abdominal pain, palpitations (had prior loop recorder without arrhythmias) 6/25, ER - chest pain, normal EKG and troponins. F/u with cardiology and GI 6/26, Dr. Percival Spanish, cardiology office - normal coronary arteries in 2019; ?vasospasm; increased Imdur to 60 mg daily; 2-week heart monitor 6/29, ER visit with cardiology consult - palpitations and PACs, started metoprolol, has Zio patch  She reports that 6/16 she felt like her heart was fluttering.  It scared her, made her feel like she was having a heart attack.  She came in and they ran tests and everything was normal.  She has been having heart fluttering since.  They found inflammation of the bowel and constipation and PACs.  Since the symptoms started, nothing has really changed.  She is taking medications and it doesn't seem to be changing anything.  Sometimes things may get a little worse.  It feels like the fluttering might be going on longer.  Her chest pain is substernal and to the left.  Abdominal pain is all over.  She took Marketing [employer]; they seemed to help but they are all gone.  They have a PCP appointment on 7/10.  She is supposed to see GI (Dr. Henrene Pastor) on 7/18.  Her chest started "acting up" and she was feeling nauseated and so she decided to  come back.  Zio patch is in place and due to be removed on 7/12.  She is able to eat, mildly nauseated.  No dysphagia.  There doesn't seem to be a pattern or anything that makes the chest pain worse.  She lost 100 pounds over time, 20 pounds in the last 6 months.  She is "sort of" having reflux symptoms despite BID Dexilant.  She is not certain if she is taking it, but Protonix is also listed on her Med rec.   ER Course:  Symptomatic bradycardia, likely related to recent metoprolol.  Cardiology will see.  Ongoing chest/abdominal pain, negative CT.     Review of Systems: As mentioned in the history of present illness. All other systems reviewed and are negative. Past Medical History:  Diagnosis Date   Arthritis    PAIN AND OA BOTH KNEES AND SHOULDERS AND ELBOWS AND WRIST   Blood transfusion without reported diagnosis 2018   after cholecystectomy   Broken foot, right, closed, initial encounter 09/15/2021   no surgery needed, fell at home and went to ED   Diabetes mellitus    ORAL MEDICATION   GERD (gastroesophageal reflux disease)    Gout    NO RECENT FLARE UPS   H/O: rheumatic fever    AS A CHILD - NO KNOWN HEART MURMUR OR HEART PROBLEMS   Heart attack ([place])    Hyperlipidemia    Hypertension    PFO (patent foramen ovale)    Stroke ([place]) 03/2017   Past Surgical History:  Procedure Laterality Date   ABDOMINAL HYSTERECTOMY     CESAREAN SECTION     CHOLECYSTECTOMY N/A 12/09/2016   Procedure: LAPAROSCOPIC CHOLECYSTECTOMY WITH INTRAOPERATIVE CHOLANGIOGRAM;  Surgeon: Stark Klein, MD;  Location: [facility];  Service: General;  Laterality: N/A;   COLONOSCOPY     COLONOSCOPY WITH PROPOFOL N/A 01/30/2013   Procedure: COLONOSCOPY WITH PROPOFOL;  Surgeon: Irene Shipper, MD;  Location: [facility];  Service: Endoscopy;  Laterality: N/A;   ERCP N/A 12/08/2016   Procedure: ENDOSCOPIC RETROGRADE CHOLANGIOPANCREATOGRAPHY (ERCP);  Surgeon: Ladene Artist, MD;  Location: [facility];  Service:  Endoscopy;  Laterality: N/A;   ESOPHAGOGASTRODUODENOSCOPY (EGD) WITH PROPOFOL N/A 01/30/2013   Procedure: ESOPHAGOGASTRODUODENOSCOPY (EGD) WITH PROPOFOL;  Surgeon: Irene Shipper, MD;  Location: [facility];  Service: Endoscopy;  Laterality: N/A;   LEFT HEART CATH AND CORONARY ANGIOGRAPHY N/A 10/18/2017   Procedure: LEFT HEART CATH AND CORONARY ANGIOGRAPHY;  Surgeon: Martinique, Peter M, MD;  Location: [facility];  Service: Cardiovascular;  Laterality: N/A;   LOOP RECORDER INSERTION N/A 03/18/2017   Procedure: Loop Recorder Insertion;  Surgeon: Constance Haw, MD;  Location: [facility];  Service: Cardiovascular;  Laterality: N/A;   PARTIAL HYSTERECTOMY     TEE WITHOUT CARDIOVERSION N/A 03/16/2017   Procedure: TRANSESOPHAGEAL ECHOCARDIOGRAM (TEE);  Surgeon: Acie Fredrickson Wonda Cheng, MD;  Location: [facility];  Service: Cardiovascular;  Laterality: N/A;   TOTAL KNEE ARTHROPLASTY Right 04/20/2014   Procedure: RIGHT TOTAL KNEE ARTHROPLASTY CONVERTED TO RIGHT KNEE REIMPLANTATION;  Surgeon: Mcarthur Rossetti, MD;  Location: [facility];  Service: Orthopedics;  Laterality: Right;   TOTAL KNEE ARTHROPLASTY Left 08/23/2015   Procedure: LEFT TOTAL KNEE ARTHROPLASTY;  Surgeon: Mcarthur Rossetti, MD;  Location: [facility];  Service: Orthopedics;  Laterality: Left;   VENTRAL HERNIA REPAIR     x2   Social History:  reports that she has never smoked. She has never used smokeless tobacco. She reports that she does not currently use alcohol. She reports that she does not use drugs.  Allergies  Allergen Reactions   Other Other (See Comments)    Some BANDAIDS cause skin irritation    Penicillins Itching    Has patient had a PCN reaction causing immediate rash, facial/tongue/throat swelling, SOB or lightheadedness with hypotension: No Has patient had a PCN reaction causing severe rash involving mucus membranes or skin necrosis: No Has patient had a PCN reaction that required hospitalization No Has  patient had a PCN reaction occurring within the last 10 years: No If all of the above answers are "NO", then may proceed with Cephalosporin use.     Family History  Problem Relation Age of Onset   Diabetes Mother    Hypertension Mother        entire family   Breast cancer Mother        diagnosed in her 16's   Stroke Father        CVA   Hyperlipidemia Father        entire family   Diabetes Father    Heart disease Father        No details.  Not at an early age.     Crohn's disease Son    Irritable bowel syndrome Son    Colon cancer Neg Hx    Colon polyps Neg Hx    Esophageal cancer Neg Hx    Stomach cancer Neg Hx    Rectal cancer Neg Hx     Prior to Admission medications  Medication Sig Start Date End Date Taking? Authorizing Provider  allopurinol (ZYLOPRIM) 300 MG tablet Take 1 tablet (300 mg total) by mouth 2 (two) times daily. 08/26/21  Yes Lorrene Reid, PA-C  aspirin 81 MG EC tablet Take 1 tablet (81 mg total) by mouth daily. 10/18/17  Yes Reino Bellis B, NP  Calcium Carb-Cholecalciferol (CALCIUM 600-D PO) Take 1 tablet by mouth 2 (two) times daily before a meal.   Yes [provider]  dexlansoprazole (DEXILANT) 60 MG capsule TAKE 1 CAPSULE BY MOUTH 2 TIMES DAILY. Patient taking differently: Take 60 mg by mouth in the morning and at bedtime. 12/29/21  Yes Esterwood, [name] S, PA-C  docusate sodium (COLACE) 100 MG capsule Take 1 capsule (100 mg total) by mouth 2 (two) times daily as needed for mild constipation. 03/03/22  Yes Drenda Freeze, MD  Emollient (UDDERLY SMOOTH) CREA Apply 1 application topically See admin instructions. Apply topically to skin folds as needed for rash/irritation   Yes [provider]  fenofibrate 160 MG tablet TAKE 1 TABLET BY MOUTH EVERY DAY Patient taking differently: Take 160 mg by mouth daily. 02/16/22  Yes Abonza, Maritza, PA-C  ferrous sulfate 325 (65 FE) MG tablet TAKE 1 TABLET BY MOUTH TWICE A DAY Patient taking  differently: Take 325 mg by mouth 2 (two) times daily with a meal. 02/02/22  Yes Zehr, Laban Emperor, PA-C  isosorbide mononitrate (IMDUR) 60 MG 24 hr tablet Take 1 tablet (60 mg total) by mouth daily. 03/09/22  Yes Minus Breeding, MD  metoprolol succinate (TOPROL-XL) 25 MG 24 hr tablet Take 1 tablet (25 mg total) by mouth daily. 03/12/22  Yes Roemhildt, Lorin T, PA-C  olmesartan (BENICAR) 5 MG tablet TAKE 1 TABLET (5 MG TOTAL) BY MOUTH DAILY. 02/02/22  Yes Abonza, Maritza, PA-C  ondansetron (ZOFRAN) 4 MG tablet Take 1 tablet (4 mg total) by mouth every 8 (eight) hours as needed for nausea or vomiting. 02/27/22  Yes Tegeler, Gwenyth Allegra, MD  polyethylene glycol (MIRALAX / GLYCOLAX) 17 g packet Take 17 g by mouth 2 (two) times daily. 03/03/22  Yes Drenda Freeze, MD  rosuvastatin (CRESTOR) 40 MG tablet Take 1 tablet (40 mg total) by mouth daily. PLEASE MAKE APPOINTMENT WITH CARDIOLOGIST FOR FURTHER REFILLS Patient taking differently: Take 40 mg by mouth daily. 02/03/22  Yes Hochrein, Jeneen Rinks, MD  Vitamin D, Ergocalciferol, (DRISDOL) 1.25 MG (50000 UNIT) CAPS capsule TAKE 1 CAPSULE BY MOUTH ONE TIME PER WEEK Patient taking differently: Take 50,000 Units by mouth every 7 (seven) days. Takes on Monday's 02/02/22  Yes Abonza, Maritza, PA-C  ACCU-CHEK AVIVA PLUS test strip USE AS INSTRUCTED ONCE A DAY. DX CODE E11.9 02/10/22   Lorrene Reid, PA-C  ACCU-CHEK SOFTCLIX LANCETS lancets Use as instructed once a day.  DX Code: E11.9 09/21/17   Carollee Herter, Alferd Apa, DO  JANUMET XR 50-500 MG TB24 TAKE 1 TABLET BY MOUTH EVERY DAY Patient not taking: Reported on 03/09/2022 12/26/21   Lorrene Reid, PA-C  pantoprazole (PROTONIX) 40 MG tablet Take 1 tablet (40 mg total) by mouth daily. 01/14/22   Irene Shipper, MD    Physical Exam: Vitals:   03/16/22 1400 03/16/22 1500 03/16/22 1530 03/16/22 1640  BP: 130/77 121/71 (!) 137/59 110/63  Pulse: (!) 44 (!) 51 (!) 49 (!) 54  Resp: 12 16 15 15   Temp:      SpO2: 100% 100% 100%  99%   General:  Appears calm and comfortable and is in NAD Eyes:  EOMI, normal lids, iris ENT:  grossly normal hearing, lips & tongue, mmm Neck:  no LAD, masses or thyromegaly Cardiovascular:  RR with bradycardia in 50-60 range, no m/r/g. No LE edema.  Respiratory:   CTA bilaterally with no wheezes/rales/rhonchi.  Normal respiratory effort. Abdomen:  soft, +epigastric > diffuse TTP, ND Skin:  no rash or induration seen on limited exam Musculoskeletal:  grossly normal tone BUE/BLE, good ROM, no bony abnormality Psychiatric:  blunted mood and affect, speech fluent and appropriate, AOx3 Neurologic:  CN 2-12 grossly intact, moves all extremities in coordinated fashion   Radiological Exams on Admission: Independently reviewed - see discussion in A/P where applicable  CT Angio Chest/Abd/Pel for Dissection W and/or Wo Contrast  Result Date: 03/16/2022 CLINICAL DATA:  Chest pain and back pain. Aortic dissection suspected. EXAM: CT ANGIOGRAPHY CHEST, ABDOMEN AND PELVIS TECHNIQUE: Non-contrast CT of the chest was initially obtained. Multidetector CT imaging through the chest, abdomen and pelvis was performed using the standard protocol during bolus administration of intravenous contrast. Multiplanar reconstructed images and MIPs were obtained and reviewed to evaluate the vascular anatomy. RADIATION DOSE REDUCTION: This exam was performed according to the departmental dose-optimization program which includes automated exposure control, adjustment of the mA and/or kV according to patient size and/or use of iterative reconstruction technique. CONTRAST:  1104m OMNIPAQUE IOHEXOL 350 MG/ML SOLN COMPARISON:  Chest radiography same day. CT abdomen pelvis 03/03/2022. FINDINGS: CTA CHEST FINDINGS Cardiovascular: Heart size upper limits of normal. No visible coronary artery calcification. No pericardial fluid. No atherosclerotic change of the aorta. No evidence of aneurysm or dissection. Pulmonary arteries appear  normal. Mediastinum/Nodes: No mass or lymphadenopathy. Lungs/Pleura: No pleural effusion. The lungs are clear. No underlying emphysema. No infiltrate or collapse. No mass or nodule. Musculoskeletal: Relative elevation of the left hemidiaphragm. Ordinary mild thoracic degenerative changes. No evidence of rib fracture. Review of the MIP images confirms the above findings. CTA ABDOMEN AND PELVIS FINDINGS VASCULAR Aorta: The aorta is normal. No atherosclerotic change. No aneurysm or dissection. Celiac: Normal SMA: Normal Renals: Normal IMA: Normal Inflow: Normal Veins: Normal Review of the MIP images confirms the above findings. NON-VASCULAR Hepatobiliary: Previous cholecystectomy. Chronic air in the biliary tree presumably secondary to previous sphincterotomy, as seen previously. No significant finding. Pancreas: Normal Spleen: Normal Adrenals/Urinary Tract: Normal adrenal glands. Mild renal cortical scarring. No stone or hydronephrosis. Bladder is normal. Stomach/Bowel: Stomach and small intestine are normal. Diverticulosis without evidence of diverticulitis. Lymphatic: No adenopathy. Reproductive: Previous hysterectomy.  No pelvic mass. Other: No free fluid or air. Musculoskeletal: Ordinary lumbar degenerative findings. Review of the MIP images confirms the above findings. IMPRESSION: No evidence of acute aortic or other vascular pathology. Patient does not show atherosclerotic disease of the aorta or the coronary arteries. The lungs are clear. No acute abdominal or pelvic finding. Ordinary mild degenerative changes of the spine. Electronically Signed   By: MNelson ChimesM.D.   On: 03/16/2022 12:45   DG Chest 2 View  Result Date: 03/16/2022 CLINICAL DATA:  Chest pain EXAM: CHEST - 2 VIEW COMPARISON:  Radiograph 03/12/2022 FINDINGS: Unchanged device overlying the left upper chest. Unchanged cardiomediastinal silhouette. No focal airspace disease. No pleural effusion. No pneumothorax. There is no acute osseous  abnormality. Thoracic spondylosis. IMPRESSION: No evidence of acute cardiopulmonary disease. Electronically Signed   By: JMaurine SimmeringM.D.   On: 03/16/2022 09:46    EKG: Independently reviewed.   921 - NSR with rate 63; LAFB; no evidence of acute ischemia 956 - NSR  with rate 51; LAFB; no evidence of acute ischemia 1215 - Sinus bradycardia with rate 44; LAFB; no evidence of acute ischemia  Labs on Admission: I have personally reviewed the available labs and imaging studies at the time of the admission.  Pertinent labs:    Glucose 105 BUN 11/Creatinine 1.24/GFR 47 - stable HS troponin 9. 8 WBC 6.3 Hgb 11.5 COVID/flu negative UA WNL   Assessment and Plan: Principal Problem:   Epigastric pain Active Problems:   Chest pain of uncertain etiology   Constipation    Epigastric Pain -Patient has been seen 7 times in the ER and once in the cardiology office since 6/16 for a multitude of complaints that appear to be related to palpitations and epigastric pain (based on exam today) -She has had repeatedly normal labs and imaging -She does endorse constipation and some relief with Colace and Miralax but she "ran out" of these medications and the issues recurred -She also endorses GERD symptoms despite BID Dexilant and possibly concurrent Protonix -She does not have an admitting diagnosis at this time and does not appear to need hospitalization (she is very frustrated about this) -I have spoken with GI; her current appointment is July 18 but can move to 7/6 at 1:30 with Nicoletta Ba, likely needs EGD -She needs to resume a good bowel regimen; Miralax BID with Colace helped but not enough so would back down Miralax to once daily add Dulcolax qhs -I would also recommend adding Colace  Chest pain/palpitations -She had a negative cath in 2019, has no atherosclerosis on CTA today (remarkable!), and has had negative troponins and EKGs repeatedly -She is wearing a Zio patch and did have PACs on her  implanted loop recorder -She was started on metoprolol during her last visit, but her baseline HR appears to be in the 50-60 range -Her bradycardia is not significantly worse today and her symptoms aren't either, so the metoprolol doesn't appear to be causing significant issues but also likely should be stopped -Will defer to cardiology, but if ok with them I think she is appropriate for dc again today  No other medication changes are needed at this time, after review of medications. As noted above, would: 1- stop metoprolol 2- add Carafate qAC and qhs 3- add bowel regimen (Colace BID, Miralax qAM, Dulcolax PO qhs) 4- GI appt changed to 7/6 at 1:30 for consideration of EGD   Thank you for this interesting consult.  The patient appears to be medically stable and does not appear to require admission at this time.    Author: Karmen Bongo, MD 03/16/2022 4:58 PM  For on call review [email or web address].

## 2022-03-16 NOTE — ED Notes (Signed)
Pt ambulated to bathroom 

## 2022-03-16 NOTE — ED Provider Notes (Signed)
  Physical Exam  BP 136/71   Pulse (!) 51   Temp 97.6 F (36.4 C)   Resp 17   SpO2 100%   Physical Exam Vitals and nursing note reviewed.  Constitutional:      Appearance: Normal appearance.  HENT:     Head: Normocephalic and atraumatic.  Eyes:     Conjunctiva/sclera: Conjunctivae normal.  Pulmonary:     Effort: Pulmonary effort is normal. No respiratory distress.  Skin:    General: Skin is warm and dry.  Neurological:     Mental Status: She is alert.  Psychiatric:        Mood and Affect: Mood normal.        Behavior: Behavior normal.    ED Course / MDM  Medical Decision Making Amount and/or Complexity of Data Reviewed Labs: ordered. Radiology: ordered.  Risk Prescription drug management. Decision regarding hospitalization.  Accepted handoff at shift change from [facility]. Please see prior provider note for full HPI.  Briefly: Patient is 71 year old female who presents to the ER for generalized body aches and chest pain for 2-3 weeks. Was seen on 6/29 by myself and was discharged after cardiology consultation. Cardiology placed patient on metoprolol for likely symptomatic PAC's. Today patient presents with most likely symptomatic bradycardia.   DDX/Plan: Plan to await discussion by cardiology and hospitalist to determine disposition.  6:30pm After discussion with hospitalist Dr Brihany Butch [facility] and cardiologist Dr Loyal Gambler who do not recommend admission to the hospital.  Hospitalist recommended making changes to patient's GI medications, including adding Dulcolax and Carafate to her regimen.  Patient has a follow-up appointment with GI in 3 days.  Discussed this with the patient, and she is agreeable to the plan, and all questions answered.  Portions of this report may have been transcribed using voice recognition software. Every effort was made to ensure accuracy; however, inadvertent computerized transcription errors may be present.    Kateri Plummer,  PA-C 03/16/22 Elenora Gamma, DO 03/16/22 1910

## 2022-03-16 NOTE — ED Notes (Signed)
Patient transported to CT 

## 2022-03-16 NOTE — Discharge Instructions (Addendum)
You were seen in the emergency department for body aches and chest pain.  We discussed your case with both the hospital doctors and the cardiologist.  They did not recommend you be admitted to the hospital today.  However, we agreed to discontinue your metoprolol.    I think that your symptoms could be more so related from your gastrointestinal system than your heart.  For that reason we will make some changes to some of your medications.  I would like you to take the Colace twice daily, MiraLAX once daily, Dulcolax once every night, and Carafate four times daily. I have sent the Dulcolax and Carafate to your pharmacy.   Please follow up with your GI doctor at your appointment to discuss scopes.

## 2022-03-16 NOTE — ED Provider Notes (Signed)
[facility] EMERGENCY DEPARTMENT Provider Note   CSN: [id number] Arrival date & time: 03/16/22  0907     History  Chief Complaint  Patient presents with   Generalized Body Aches   Chest Pain   Abdominal Pain    Rachel Black is a 71 y.o. female history includes CVA, diabetes, GERD, gout, hypertension, hyperlipidemia, MI.  Patient presents to the ER today for evaluation of chest pain and abdominal pain, she reports pain has been ongoing for the past 2-3 weeks, she reports that she has been seen in the ER multiple times for the same problem and has not found any clear cause for her symptoms.  She describes chest pain as an aching central pain constant nonradiating no clear aggravating or alleviating factors.  Additionally patient describes abdominal pain as a diffuse aching pain as well she reports it is not related to her chest pain but has been going on for around the same amount of time, pain does not radiate and there is no clear aggravating or alleviating factors.  Patient has fall, injury, fever, chills, numbness, tingling, cough/hemoptysis, extremity swelling/color change or any additional concerns.  Of note patient reports that she started metoprolol earlier this week for heart palpitations HPI     Home Medications Prior to Admission medications   Medication Sig Start Date End Date Taking? Authorizing Provider  allopurinol (ZYLOPRIM) 300 MG tablet Take 1 tablet (300 mg total) by mouth 2 (two) times daily. 08/26/21  Yes Lorrene Reid, PA-C  aspirin 81 MG EC tablet Take 1 tablet (81 mg total) by mouth daily. 10/18/17  Yes Reino Bellis B, NP  Calcium Carb-Cholecalciferol (CALCIUM 600-D PO) Take 1 tablet by mouth 2 (two) times daily before a meal.   Yes [provider]  dexlansoprazole (DEXILANT) 60 MG capsule TAKE 1 CAPSULE BY MOUTH 2 TIMES DAILY. Patient taking differently: Take 60 mg by mouth in the morning and at bedtime. 12/29/21  Yes Esterwood,  [name] S, PA-C  docusate sodium (COLACE) 100 MG capsule Take 1 capsule (100 mg total) by mouth 2 (two) times daily as needed for mild constipation. 03/03/22  Yes Drenda Freeze, MD  Emollient (UDDERLY SMOOTH) CREA Apply 1 application topically See admin instructions. Apply topically to skin folds as needed for rash/irritation   Yes [provider]  fenofibrate 160 MG tablet TAKE 1 TABLET BY MOUTH EVERY DAY Patient taking differently: Take 160 mg by mouth daily. 02/16/22  Yes Abonza, Maritza, PA-C  ferrous sulfate 325 (65 FE) MG tablet TAKE 1 TABLET BY MOUTH TWICE A DAY Patient taking differently: Take 325 mg by mouth 2 (two) times daily with a meal. 02/02/22  Yes Zehr, Laban Emperor, PA-C  isosorbide mononitrate (IMDUR) 60 MG 24 hr tablet Take 1 tablet (60 mg total) by mouth daily. 03/09/22  Yes Minus Breeding, MD  metoprolol succinate (TOPROL-XL) 25 MG 24 hr tablet Take 1 tablet (25 mg total) by mouth daily. 03/12/22  Yes Roemhildt, Lorin T, PA-C  olmesartan (BENICAR) 5 MG tablet TAKE 1 TABLET (5 MG TOTAL) BY MOUTH DAILY. 02/02/22  Yes Abonza, Maritza, PA-C  ondansetron (ZOFRAN) 4 MG tablet Take 1 tablet (4 mg total) by mouth every 8 (eight) hours as needed for nausea or vomiting. 02/27/22  Yes Tegeler, Gwenyth Allegra, MD  polyethylene glycol (MIRALAX / GLYCOLAX) 17 g packet Take 17 g by mouth 2 (two) times daily. 03/03/22  Yes Drenda Freeze, MD  rosuvastatin (CRESTOR) 40 MG tablet Take 1 tablet (  40 mg total) by mouth daily. PLEASE MAKE APPOINTMENT WITH CARDIOLOGIST FOR FURTHER REFILLS Patient taking differently: Take 40 mg by mouth daily. 02/03/22  Yes Hochrein, Jeneen Rinks, MD  Vitamin D, Ergocalciferol, (DRISDOL) 1.25 MG (50000 UNIT) CAPS capsule TAKE 1 CAPSULE BY MOUTH ONE TIME PER WEEK Patient taking differently: Take 50,000 Units by mouth every 7 (seven) days. Takes on Monday's 02/02/22  Yes Abonza, Maritza, PA-C  ACCU-CHEK AVIVA PLUS test strip USE AS INSTRUCTED ONCE A DAY. DX CODE E11.9 02/10/22    Lorrene Reid, PA-C  ACCU-CHEK SOFTCLIX LANCETS lancets Use as instructed once a day.  DX Code: E11.9 09/21/17   Carollee Herter, Alferd Apa, DO  JANUMET XR 50-500 MG TB24 TAKE 1 TABLET BY MOUTH EVERY DAY Patient not taking: Reported on 03/09/2022 12/26/21   Lorrene Reid, PA-C  pantoprazole (PROTONIX) 40 MG tablet Take 1 tablet (40 mg total) by mouth daily. 01/14/22   Irene Shipper, MD      Allergies    Other and Penicillins    Review of Systems   Review of Systems Ten systems are reviewed and are negative for acute change except as noted in the HPI  Physical Exam Updated Vital Signs BP 121/71   Pulse (!) 51   Temp 97.6 F (36.4 C)   Resp 16   SpO2 100%  Physical Exam Constitutional:      General: She is not in acute distress.    Appearance: Normal appearance. She is well-developed. She is not ill-appearing or diaphoretic.  HENT:     Head: Normocephalic and atraumatic.  Eyes:     General: Vision grossly intact. Gaze aligned appropriately.     Pupils: Pupils are equal, round, and reactive to light.  Neck:     Trachea: Trachea and phonation normal.  Cardiovascular:     Rate and Rhythm: Regular rhythm. Bradycardia present.     Pulses: Normal pulses.          Radial pulses are 2+ on the right side and 2+ on the left side.       Dorsalis pedis pulses are 2+ on the right side and 2+ on the left side.     Comments: Patient is wearing a Zio patch Pulmonary:     Effort: Pulmonary effort is normal. No respiratory distress.  Chest:     Chest wall: Tenderness present.  Abdominal:     General: There is no distension.     Palpations: Abdomen is soft.     Tenderness: There is abdominal tenderness. There is no guarding or rebound.  Musculoskeletal:        General: Normal range of motion.     Cervical back: Normal range of motion.     Right lower leg: No edema.     Left lower leg: No edema.  Skin:    General: Skin is warm and dry.  Neurological:     Mental Status: She is alert.      GCS: GCS eye subscore is 4. GCS verbal subscore is 5. GCS motor subscore is 6.     Comments: Speech is clear and goal oriented, follows commands Major Cranial nerves without deficit, no facial droop Moves extremities without ataxia, coordination intact  Psychiatric:        Behavior: Behavior normal.     ED Results / Procedures / Treatments   Labs (all labs ordered are listed, but only abnormal results are displayed) Labs Reviewed  COMPREHENSIVE METABOLIC PANEL - Abnormal; Notable for the following components:  Result Value   Glucose, Bld 105 (*)    Creatinine, Ser 1.24 (*)    Total Protein 5.7 (*)    Albumin 3.4 (*)    GFR, Estimated 47 (*)    All other components within normal limits  CBC - Abnormal; Notable for the following components:   RBC 3.69 (*)    Hemoglobin 11.5 (*)    HCT 35.1 (*)    All other components within normal limits  RESP PANEL BY RT-PCR (FLU A&B, COVID) ARPGX2  LIPASE, BLOOD  URINALYSIS, ROUTINE W REFLEX MICROSCOPIC  CBG MONITORING, ED  TROPONIN I (HIGH SENSITIVITY)  TROPONIN I (HIGH SENSITIVITY)    EKG EKG Interpretation  Date/Time:  Monday March 16 2022 12:15:13 EDT Ventricular Rate:  44 PR Interval:  184 QRS Duration: 114 QT Interval:  437 QTC Calculation: 374 R Axis:   -75 Text Interpretation: Sinus bradycardia Left anterior fascicular block Consider anterior infarct Confirmed by Dene Gentry [phone]) on 03/16/2022 1:54:52 PM  Radiology CT Angio Chest/Abd/Pel for Dissection W and/or Wo Contrast  Result Date: 03/16/2022 CLINICAL DATA:  Chest pain and back pain. Aortic dissection suspected. EXAM: CT ANGIOGRAPHY CHEST, ABDOMEN AND PELVIS TECHNIQUE: Non-contrast CT of the chest was initially obtained. Multidetector CT imaging through the chest, abdomen and pelvis was performed using the standard protocol during bolus administration of intravenous contrast. Multiplanar reconstructed images and MIPs were obtained and reviewed to evaluate the  vascular anatomy. RADIATION DOSE REDUCTION: This exam was performed according to the departmental dose-optimization program which includes automated exposure control, adjustment of the mA and/or kV according to patient size and/or use of iterative reconstruction technique. CONTRAST:  168m OMNIPAQUE IOHEXOL 350 MG/ML SOLN COMPARISON:  Chest radiography same day. CT abdomen pelvis 03/03/2022. FINDINGS: CTA CHEST FINDINGS Cardiovascular: Heart size upper limits of normal. No visible coronary artery calcification. No pericardial fluid. No atherosclerotic change of the aorta. No evidence of aneurysm or dissection. Pulmonary arteries appear normal. Mediastinum/Nodes: No mass or lymphadenopathy. Lungs/Pleura: No pleural effusion. The lungs are clear. No underlying emphysema. No infiltrate or collapse. No mass or nodule. Musculoskeletal: Relative elevation of the left hemidiaphragm. Ordinary mild thoracic degenerative changes. No evidence of rib fracture. Review of the MIP images confirms the above findings. CTA ABDOMEN AND PELVIS FINDINGS VASCULAR Aorta: The aorta is normal. No atherosclerotic change. No aneurysm or dissection. Celiac: Normal SMA: Normal Renals: Normal IMA: Normal Inflow: Normal Veins: Normal Review of the MIP images confirms the above findings. NON-VASCULAR Hepatobiliary: Previous cholecystectomy. Chronic air in the biliary tree presumably secondary to previous sphincterotomy, as seen previously. No significant finding. Pancreas: Normal Spleen: Normal Adrenals/Urinary Tract: Normal adrenal glands. Mild renal cortical scarring. No stone or hydronephrosis. Bladder is normal. Stomach/Bowel: Stomach and small intestine are normal. Diverticulosis without evidence of diverticulitis. Lymphatic: No adenopathy. Reproductive: Previous hysterectomy.  No pelvic mass. Other: No free fluid or air. Musculoskeletal: Ordinary lumbar degenerative findings. Review of the MIP images confirms the above findings. IMPRESSION:  No evidence of acute aortic or other vascular pathology. Patient does not show atherosclerotic disease of the aorta or the coronary arteries. The lungs are clear. No acute abdominal or pelvic finding. Ordinary mild degenerative changes of the spine. Electronically Signed   By: MNelson ChimesM.D.   On: 03/16/2022 12:45   DG Chest 2 View  Result Date: 03/16/2022 CLINICAL DATA:  Chest pain EXAM: CHEST - 2 VIEW COMPARISON:  Radiograph 03/12/2022 FINDINGS: Unchanged device overlying the left upper chest. Unchanged cardiomediastinal silhouette. No focal airspace disease.  No pleural effusion. No pneumothorax. There is no acute osseous abnormality. Thoracic spondylosis. IMPRESSION: No evidence of acute cardiopulmonary disease. Electronically Signed   By: Maurine Simmering M.D.   On: 03/16/2022 09:46    Procedures Procedures    Medications Ordered in ED Medications  iohexol (OMNIPAQUE) 350 MG/ML injection 100 mL (100 mLs Intravenous Contrast Given 03/16/22 1236)  sodium chloride 0.9 % bolus 500 mL (500 mLs Intravenous New Bag/Given 03/16/22 1423)    ED Course/ Medical Decision Making/ A&P Clinical Course as of 03/16/22 1522  Mon Mar 16, 2022  1435 Mickel Baas [BM]  1516 Lorin Mercy [BM]    Clinical Course User Index [BM] Gari Crown                           Medical Decision Making 71 year old female with ongoing chest and abdominal pain for the past few weeks.  On evaluation she is in no acute distress she is tender to both the chest and the abdomen.  On chart review patient has had CT abdomen pelvis few weeks ago but no prior CT imaging of the chest.  I discussed risks and benefits of CT dissection study with patient and her husband, they state understanding and wished to proceed.  Additional chest pain and abdominal pain lab work was ordered.  Differential at this time includes but not limited to ACS, dissection, PE, PTX, PNA, GERD, cholecystitis, pancreatitis.  Amount and/or Complexity of Data  Reviewed Independent Historian: spouse    Details: Husband at bedside External Data Reviewed: labs, radiology and notes.    Details: Patient seen in ER on 03/12/2022 at that time she had CBC, BMP, troponin, chest x-ray, EKG, magnesium leve labs notable for troponin of 22-21.  Cardiology visit 03/09/2022.  She was given Imdur 60 mg to help with possible coronary spasm.  It appears they found a heart cath result from 2019 which showed single-vessel occlusive CAD side branch of the second OM..  I gave her a Zio monitor for 2 weeks as she is currently wearing. Labs: ordered.    Details: I have ordered, reviewed and interpreted the following labs.  CBC with mild anemia of 11.5.  No leukocytosis to suggest infectious process.  No thrombocytopenia. CMP shows baseline creatinine of 1.24.  No emergent Electra derangement, LFT elevations, AKI or gap. COVID/influenza panel negative. Lipase within normal limits.  Low suspicion for pancreatitis. Urinalysis without evidence for infection. CBG 95. Initial and delta high-sensitivity troponins within normal limits. Radiology: ordered and independent interpretation performed.    Details: I personally ordered, reviewed and interpreted patient's chest x-ray.  I do not appreciate any obvious PTX, PNA or other acute cardiopulmonary pathologies. ECG/medicine tests: ordered and independent interpretation performed.    Details: I personally reviewed and interpreted patient's EKG.  Appears a sinus bradycardia.  No obvious acute ischemic changes. I have personally ordered, reviewed and interpreted patient's CT dissection study.  I do not appreciate any obvious dissection.  Risk Prescription drug management. Decision regarding hospitalization. Risk Details: Patient reports continued general achiness as well as lightheadedness and feeling of fatigue, she is noted to have persistent sinus bradycardia with rates mid 40s.  This may be secondary to her recent metoprolol.  I  discussed the case with attending physician Dr. Francia Greaves, will consult cardiology and medicine team for admission and further monitoring.   I spoke with cardiology coordinator Mickel Baas at 2:35 PM.  Advises they will be by to consult on  this patient and provide further recommendations.  3:16 PM: Consult with hospitalist Dr. Lorin Mercy, advises that she will be by to evaluate this patient, suspect bradycardia is likely due to metoprolol, will give recommendations shortly. - 3:20 PM: Patient reassessed well-appearing no acute distress reports she is feeling well.  She had some food from the cafeteria, tolerated without difficulty. -  Care handoff given to [place] PA-C at shift change.  Plan of care is to await cardiology and hospitalist recommendations.  Final disposition per oncoming team.   Note: Portions of this report may have been transcribed using voice recognition software. Every effort was made to ensure accuracy; however, inadvertent computerized transcription errors may still be present.         Final Clinical Impression(s) / ED Diagnoses Final diagnoses:  Atypical chest pain  Bradycardia  Abdominal pain, unspecified abdominal location    Rx / DC Orders ED Discharge Orders     None         Gari Crown 03/16/22 1522    Valarie Merino, MD 03/17/22 1200

## 2022-03-16 NOTE — Consult Note (Addendum)
Cardiology Consultation:   Patient ID: [name] MRN: [id number]; DOB: 1951-06-14  Admit date: 03/16/2022 Date of Consult: 03/16/2022  PCP:  Rachel Black, [place] [facility] Cardiologist:  Minus Breeding, MD  Cardiology APP:  Lonn Georgia, PA-C       Patient Profile:   Rachel Black is a 71 y.o. female with a hx of CAD (managed medically), cryptogenic stroke in 2018 (no arrhythmias by ILR through 2021), PFO (managed conservatively), arthritis, GERD, DM, gout, HTN, HLD, CKD stage 3a by labs (Cr 1.2-1.4 range), prior cholecystectomy who is being seen 03/16/2022 for the evaluation of palpitations and sinus bradycardia at the request of Dr. Tyrone Black.  History of Present Illness:   Rachel Black has a prior history of cryptogenic stroke in 2018 with unrevealing echo and TEE aside from PFO;  no source of embolism found. She had an ILR implanted that transmitted through 2021 without significant arrhythmias and is no longer functioning but patient elected to leave in place. She had cardiac workup in 2019 for chest pain and NSTEMI (troponin 8 by old scale). Cath showed 100% occlusion of side branch of OM2, otherwise normal coronary anatomy, normal LVEDP, managed medically. Echo had shown EF 55-60%, G1DD, mild TR.   She has lost over 100lb since 2019 unintentionally. She has had 8 ER visits recently for varying symptoms including vague chest pain, diffuse abdominal pain, and episodic palpitations. She diagnosed with colitis by CT on one occasion and constipation on another. She has had recent issues with straining and low BM volume. She had also reported times of fluttering in her chest but no overt arrhythmia aside from occasional PVCs/PACs has been noted. She has had negative troponins each time. When seen in the office 03/09/22 by Dr. Percival Black she continued to note these multiple symptoms. He increased Imdur to 45m daily given prior CAD through did not suspect she was having unstable  angina. He also arranged Zio patch. She was seen in consult by Dr. BHarl Bowie6/29/23 for palpitations and started on trial of Toprol 258mdaily for her PACs. She otherwise did not have a cardiac reason for admission and was discharged home. She has been pending outpatient GI evaluation 03/31/22 with Dr. PeHenrene PastorLast EGD was 2018 with bleeding hyperplastic gastric polyp and colonoscopy 2014 with diverticulosis and diminutive adenoma.   She returned to the hospital again today for continued intermittent chest pain, abdominal pain, generalized body aches and fatigue. She states she just doesn't feel well. She describes the chest discomfort as a dull pain across her chest that seems to coincide with the abdominal pain that primarily settles in her low abdomen. This is most prominent in the morning hours and gets better as the day goes on. It can last up to an hour at a time without specific reproducible provocative measures. Not reliably worse with exertion, palpation, inspiration. No bleeding in stools. Not really having dyspnea or edema. No syncope. She has been wearing the Zio patch. Palpitations occur primarily when lying down very still and quiet. She felt them flaring up in the ER presently while she is in NSR/SB. HR has been mid 40s-low 50s. Looking back at tele earlier this encounter, there is minimally rare PVC otherwise. hsTroponin 9->8, Hgb 11.5, K 3.9, Cr 1.24, albumin 3.4, otherwise unrevealing. CXR NAD. CTA chest/abd/pelvis did not show any evidence of aortic or other vascular pathology, lungs clear, ordinary mild degenerative changes of the spine. EKG shows NSR/SB 44-60s without acute EKG changes.  Recent TSH wnl.   Past Medical History:  Diagnosis Date   Arthritis    PAIN AND OA BOTH KNEES AND SHOULDERS AND ELBOWS AND WRIST   Blood transfusion without reported diagnosis 2018   after cholecystectomy   Broken foot, right, closed, initial encounter 09/15/2021   no surgery needed, fell at home and went  to ED   Diabetes mellitus    ORAL MEDICATION   GERD (gastroesophageal reflux disease)    Gout    NO RECENT FLARE UPS   H/O: rheumatic fever    AS A CHILD - NO KNOWN HEART MURMUR OR HEART PROBLEMS   Heart attack ([place])    Hyperlipidemia    Hypertension    Stroke ([place]) 03/2017    Past Surgical History:  Procedure Laterality Date   ABDOMINAL HYSTERECTOMY     CESAREAN SECTION     CHOLECYSTECTOMY N/A 12/09/2016   Procedure: LAPAROSCOPIC CHOLECYSTECTOMY WITH INTRAOPERATIVE CHOLANGIOGRAM;  Surgeon: Stark Klein, MD;  Location: [facility];  Service: General;  Laterality: N/A;   COLONOSCOPY     COLONOSCOPY WITH PROPOFOL N/A 01/30/2013   Procedure: COLONOSCOPY WITH PROPOFOL;  Surgeon: Irene Shipper, MD;  Location: [facility];  Service: Endoscopy;  Laterality: N/A;   ERCP N/A 12/08/2016   Procedure: ENDOSCOPIC RETROGRADE CHOLANGIOPANCREATOGRAPHY (ERCP);  Surgeon: Ladene Artist, MD;  Location: [facility];  Service: Endoscopy;  Laterality: N/A;   ESOPHAGOGASTRODUODENOSCOPY (EGD) WITH PROPOFOL N/A 01/30/2013   Procedure: ESOPHAGOGASTRODUODENOSCOPY (EGD) WITH PROPOFOL;  Surgeon: Irene Shipper, MD;  Location: [facility];  Service: Endoscopy;  Laterality: N/A;   LEFT HEART CATH AND CORONARY ANGIOGRAPHY N/A 10/18/2017   Procedure: LEFT HEART CATH AND CORONARY ANGIOGRAPHY;  Surgeon: Martinique, Peter M, MD;  Location: [facility];  Service: Cardiovascular;  Laterality: N/A;   LOOP RECORDER INSERTION N/A 03/18/2017   Procedure: Loop Recorder Insertion;  Surgeon: Constance Haw, MD;  Location: [facility];  Service: Cardiovascular;  Laterality: N/A;   PARTIAL HYSTERECTOMY     TEE WITHOUT CARDIOVERSION N/A 03/16/2017   Procedure: TRANSESOPHAGEAL ECHOCARDIOGRAM (TEE);  Surgeon: Acie Fredrickson Wonda Cheng, MD;  Location: [facility];  Service: Cardiovascular;  Laterality: N/A;   TOTAL KNEE ARTHROPLASTY Right 04/20/2014   Procedure: RIGHT TOTAL KNEE ARTHROPLASTY CONVERTED TO RIGHT KNEE  REIMPLANTATION;  Surgeon: Mcarthur Rossetti, MD;  Location: [facility];  Service: Orthopedics;  Laterality: Right;   TOTAL KNEE ARTHROPLASTY Left 08/23/2015   Procedure: LEFT TOTAL KNEE ARTHROPLASTY;  Surgeon: Mcarthur Rossetti, MD;  Location: [facility];  Service: Orthopedics;  Laterality: Left;   VENTRAL HERNIA REPAIR     x2     Home Medications:  Prior to Admission medications   Medication Sig Start Date End Date Taking? Authorizing Provider  allopurinol (ZYLOPRIM) 300 MG tablet Take 1 tablet (300 mg total) by mouth 2 (two) times daily. 08/26/21  Yes Rachel Reid, PA-C  aspirin 81 MG EC tablet Take 1 tablet (81 mg total) by mouth daily. 10/18/17  Yes Reino Bellis B, NP  Calcium Carb-Cholecalciferol (CALCIUM 600-D PO) Take 1 tablet by mouth 2 (two) times daily before a meal.   Yes [provider]  dexlansoprazole (DEXILANT) 60 MG capsule TAKE 1 CAPSULE BY MOUTH 2 TIMES DAILY. Patient taking differently: Take 60 mg by mouth in the morning and at bedtime. 12/29/21  Yes Esterwood, [name] S, PA-C  docusate sodium (COLACE) 100 MG capsule Take 1 capsule (100 mg total) by mouth 2 (two) times daily as needed for mild constipation. 03/03/22  Yes Drenda Freeze, MD  Emollient (UDDERLY SMOOTH) CREA Apply 1 application topically See admin instructions. Apply topically to skin folds as needed for rash/irritation   Yes [provider]  fenofibrate 160 MG tablet TAKE 1 TABLET BY MOUTH EVERY DAY Patient taking differently: Take 160 mg by mouth daily. 02/16/22  Yes Abonza, Maritza, PA-C  ferrous sulfate 325 (65 FE) MG tablet TAKE 1 TABLET BY MOUTH TWICE A DAY Patient taking differently: Take 325 mg by mouth 2 (two) times daily with a meal. 02/02/22  Yes Zehr, Laban Emperor, PA-C  isosorbide mononitrate (IMDUR) 60 MG 24 hr tablet Take 1 tablet (60 mg total) by mouth daily. 03/09/22  Yes Minus Breeding, MD  metoprolol succinate (TOPROL-XL) 25 MG 24 hr tablet Take 1 tablet (25 mg total) by mouth  daily. 03/12/22  Yes Roemhildt, Lorin T, PA-C  olmesartan (BENICAR) 5 MG tablet TAKE 1 TABLET (5 MG TOTAL) BY MOUTH DAILY. 02/02/22  Yes Abonza, Maritza, PA-C  ondansetron (ZOFRAN) 4 MG tablet Take 1 tablet (4 mg total) by mouth every 8 (eight) hours as needed for nausea or vomiting. 02/27/22  Yes Tegeler, Gwenyth Allegra, MD  polyethylene glycol (MIRALAX / GLYCOLAX) 17 g packet Take 17 g by mouth 2 (two) times daily. 03/03/22  Yes Drenda Freeze, MD  rosuvastatin (CRESTOR) 40 MG tablet Take 1 tablet (40 mg total) by mouth daily. PLEASE MAKE APPOINTMENT WITH CARDIOLOGIST FOR FURTHER REFILLS Patient taking differently: Take 40 mg by mouth daily. 02/03/22  Yes Hochrein, Jeneen Rinks, MD  Vitamin D, Ergocalciferol, (DRISDOL) 1.25 MG (50000 UNIT) CAPS capsule TAKE 1 CAPSULE BY MOUTH ONE TIME PER WEEK Patient taking differently: Take 50,000 Units by mouth every 7 (seven) days. Takes on Monday's 02/02/22  Yes Abonza, Maritza, PA-C  ACCU-CHEK AVIVA PLUS test strip USE AS INSTRUCTED ONCE A DAY. DX CODE E11.9 02/10/22   Rachel Reid, PA-C  ACCU-CHEK SOFTCLIX LANCETS lancets Use as instructed once a day.  DX Code: E11.9 09/21/17   Carollee Herter, Alferd Apa, DO  JANUMET XR 50-500 MG TB24 TAKE 1 TABLET BY MOUTH EVERY DAY Patient not taking: Reported on 03/09/2022 12/26/21   Rachel Reid, PA-C  pantoprazole (PROTONIX) 40 MG tablet Take 1 tablet (40 mg total) by mouth daily. 01/14/22   Irene Shipper, MD    Inpatient Medications: Scheduled Meds:  Continuous Infusions:  PRN Meds:   Allergies:    Allergies  Allergen Reactions   Other Other (See Comments)    Some BANDAIDS cause skin irritation    Penicillins Itching    Has patient had a PCN reaction causing immediate rash, facial/tongue/throat swelling, SOB or lightheadedness with hypotension: No Has patient had a PCN reaction causing severe rash involving mucus membranes or skin necrosis: No Has patient had a PCN reaction that required hospitalization No Has patient  had a PCN reaction occurring within the last 10 years: No If all of the above answers are "NO", then may proceed with Cephalosporin use.     Social History:   Social History   Socioeconomic History   Marital status: Married    Spouse name: [name]   Number of children: 2   Years of education: Not on file   Highest education level: Not on file  Occupational History   Occupation: housewife    Employer: UNEMPLOYED  Tobacco Use   Smoking status: Never   Smokeless tobacco: Never  Vaping Use   Vaping Use: Never used  Substance and Sexual Activity   Alcohol use:  Not Currently    Comment: rarely in the past   Drug use: Never   Sexual activity: Yes    Partners: Male  Other Topics Concern   Not on file  Social History Narrative   No exercise secondary to knee pain   Social Determinants of Health   Financial Resource Strain: Not on file  Food Insecurity: Not on file  Transportation Needs: Not on file  Physical Activity: Not on file  Stress: Not on file  Social Connections: Not on file  Intimate Partner Violence: Not on file    Family History:   Family History  Problem Relation Age of Onset   Diabetes Mother    Hypertension Mother        entire family   Breast cancer Mother        diagnosed in her 3's   Stroke Father        CVA   Hyperlipidemia Father        entire family   Diabetes Father    Heart disease Father        No details.  Not at an early age.     Crohn's disease Son    Irritable bowel syndrome Son    Colon cancer Neg Hx    Colon polyps Neg Hx    Esophageal cancer Neg Hx    Stomach cancer Neg Hx    Rectal cancer Neg Hx      ROS:  Please see the history of present illness.   All other ROS reviewed and negative.     Physical Exam/Data:   Vitals:   03/16/22 1345 03/16/22 1400 03/16/22 1500 03/16/22 1530  BP: 108/77 130/77 121/71 (!) 137/59  Pulse: (!) 50 (!) 44 (!) 51 (!) 49  Resp: 17 12 16 15   Temp:      SpO2: 100% 100% 100% 100%   No  intake or output data in the 24 hours ending 03/16/22 1605    03/09/2022   11:43 AM 03/08/2022   11:22 AM 02/27/2022   12:11 PM  Last 3 Weights  Weight (lbs) 175 lb 3.2 oz 179 lb 14.3 oz 180 lb  Weight (kg) 79.47 kg 81.6 kg 81.647 kg     There is no height or weight on file to calculate BMI.  General: Well developed, well nourished WF in no acute distress. Head: Normocephalic, atraumatic, sclera non-icteric, no xanthomas, nares are without discharge. Neck: Negative for carotid bruits. JVP not elevated. Lungs: Clear bilaterally to auscultation without wheezes, rales, or rhonchi. Breathing is unlabored. Heart: RRR S1 S2 without murmurs, rubs, or gallops.  Abdomen: Soft, non-tender, non-distended with normoactive bowel sounds. No rebound/guarding. Extremities: No clubbing or cyanosis. No edema. Distal pedal pulses are 2+ and equal bilaterally. Neuro: Alert and oriented X 3. Moves all extremities spontaneously. Psych:  Responds to questions appropriately with a normal affect.   EKG:  The EKG was personally reviewed and demonstrates:  SB 63bpm, LAFB, poor R wave progression, no acute STT changes F/u tracings showed SB 51bpm and 44bpm with similar appearance Telemetry:  Telemetry was personally reviewed and demonstrates:  NSR presently 50s, occasional SB mid upper 40s, rare PVC  Relevant CV Studies: LHC 10/2017 Lat 2nd Mrg lesion is 100% stenosed. LV end diastolic pressure is normal.   1. Single vessel occlusive CAD with 100% occlusion of side branch of the second OM 2. Otherwise normal coronary anatomy 3. Normal LVEDP   Plan: medical therapy.  2D echo 10/2017 - Left ventricle:  The cavity size was normal. There was mild    concentric hypertrophy. Systolic function was normal. The    estimated ejection fraction was in the range of 55% to 60%. Wall    motion was normal; there were no regional wall motion    abnormalities. Doppler parameters are consistent with abnormal    left  ventricular relaxation (grade 1 diastolic dysfunction).  - Aortic valve: Transvalvular velocity was within the normal range.    There was no stenosis. There was no regurgitation.  - Mitral valve: Transvalvular velocity was within the normal range.    There was no evidence for stenosis. There was no regurgitation.  - Right ventricle: The cavity size was normal. Wall thickness was    normal. Systolic function was normal.  - Atrial septum: No defect or patent foramen ovale was identified    by color flow Doppler.  - Tricuspid valve: There was mild regurgitation.  - Pulmonary arteries: Systolic pressure was within the normal    range. PA peak pressure: 21 mm Hg (S).   Laboratory Data:  High Sensitivity Troponin:   Recent Labs  Lab 03/08/22 1302 03/12/22 1224 03/12/22 1517 03/16/22 0931 03/16/22 1202  TROPONINIHS 4 22* 21* 9 8     Chemistry Recent Labs  Lab 03/12/22 1224 03/12/22 1517 03/16/22 0931  NA 138  --  140  K 3.8  --  3.9  CL 105  --  107  CO2 23  --  26  GLUCOSE 114*  --  105*  BUN 16  --  11  CREATININE 1.26*  --  1.24*  CALCIUM 9.6  --  9.5  MG  --  2.0  --   GFRNONAA 46*  --  47*  ANIONGAP 10  --  7    Recent Labs  Lab 03/16/22 0931  PROT 5.7*  ALBUMIN 3.4*  AST 19  ALT 15  ALKPHOS 47  BILITOT 0.4   Lipids No results for input(s): "CHOL", "TRIG", "HDL", "LABVLDL", "LDLCALC", "CHOLHDL" in the last 168 hours.  Hematology Recent Labs  Lab 03/12/22 1224 03/16/22 0931  WBC 8.8 6.3  RBC 3.74* 3.69*  HGB 11.7* 11.5*  HCT 35.5* 35.1*  MCV 94.9 95.1  MCH 31.3 31.2  MCHC 33.0 32.8  RDW 13.3 13.3  PLT 190 209   Thyroid No results for input(s): "TSH", "FREET4" in the last 168 hours.  BNPNo results for input(s): "BNP", "PROBNP" in the last 168 hours.  DDimer No results for input(s): "DDIMER" in the last 168 hours.   Radiology/Studies:  CT Angio Chest/Abd/Pel for Dissection W and/or Wo Contrast  Result Date: 03/16/2022 CLINICAL DATA:  Chest pain  and back pain. Aortic dissection suspected. EXAM: CT ANGIOGRAPHY CHEST, ABDOMEN AND PELVIS TECHNIQUE: Non-contrast CT of the chest was initially obtained. Multidetector CT imaging through the chest, abdomen and pelvis was performed using the standard protocol during bolus administration of intravenous contrast. Multiplanar reconstructed images and MIPs were obtained and reviewed to evaluate the vascular anatomy. RADIATION DOSE REDUCTION: This exam was performed according to the departmental dose-optimization program which includes automated exposure control, adjustment of the mA and/or kV according to patient size and/or use of iterative reconstruction technique. CONTRAST:  129m OMNIPAQUE IOHEXOL 350 MG/ML SOLN COMPARISON:  Chest radiography same day. CT abdomen pelvis 03/03/2022. FINDINGS: CTA CHEST FINDINGS Cardiovascular: Heart size upper limits of normal. No visible coronary artery calcification. No pericardial fluid. No atherosclerotic change of the aorta. No evidence of aneurysm or dissection. Pulmonary arteries appear  normal. Mediastinum/Nodes: No mass or lymphadenopathy. Lungs/Pleura: No pleural effusion. The lungs are clear. No underlying emphysema. No infiltrate or collapse. No mass or nodule. Musculoskeletal: Relative elevation of the left hemidiaphragm. Ordinary mild thoracic degenerative changes. No evidence of rib fracture. Review of the MIP images confirms the above findings. CTA ABDOMEN AND PELVIS FINDINGS VASCULAR Aorta: The aorta is normal. No atherosclerotic change. No aneurysm or dissection. Celiac: Normal SMA: Normal Renals: Normal IMA: Normal Inflow: Normal Veins: Normal Review of the MIP images confirms the above findings. NON-VASCULAR Hepatobiliary: Previous cholecystectomy. Chronic air in the biliary tree presumably secondary to previous sphincterotomy, as seen previously. No significant finding. Pancreas: Normal Spleen: Normal Adrenals/Urinary Tract: Normal adrenal glands. Mild renal  cortical scarring. No stone or hydronephrosis. Bladder is normal. Stomach/Bowel: Stomach and small intestine are normal. Diverticulosis without evidence of diverticulitis. Lymphatic: No adenopathy. Reproductive: Previous hysterectomy.  No pelvic mass. Other: No free fluid or air. Musculoskeletal: Ordinary lumbar degenerative findings. Review of the MIP images confirms the above findings. IMPRESSION: No evidence of acute aortic or other vascular pathology. Patient does not show atherosclerotic disease of the aorta or the coronary arteries. The lungs are clear. No acute abdominal or pelvic finding. Ordinary mild degenerative changes of the spine. Electronically Signed   By: Nelson Chimes M.D.   On: 03/16/2022 12:45   DG Chest 2 View  Result Date: 03/16/2022 CLINICAL DATA:  Chest pain EXAM: CHEST - 2 VIEW COMPARISON:  Radiograph 03/12/2022 FINDINGS: Unchanged device overlying the left upper chest. Unchanged cardiomediastinal silhouette. No focal airspace disease. No pleural effusion. No pneumothorax. There is no acute osseous abnormality. Thoracic spondylosis. IMPRESSION: No evidence of acute cardiopulmonary disease. Electronically Signed   By: Maurine Simmering M.D.   On: 03/16/2022 09:46     Assessment and Plan:   1. Abdominal pain, atypical chest pain, generalized malaise/aches and fatigue - symptoms cannot easily be explained by cardiac pathology, atypical for angina - patient also reports changes in stool caliber and constipation as well - will await input from Dr. Gasper Sells  2. Palpitations, here with sinus bradycardia - palpitations have had benign ER courses so far, have occurred in the absence of arrhythmias as well -> agree with continuing Zio patch that she is wearing - recent TSH wnl - stop metoprolol given bradycardia  3. CAD - continue ASA as we are able, Imdur 4m daily, rosuvastatin - stop metoprolol as above  4. Essential HTN - normotensive in ED  Has f/u with Dr. HPercival Spanishin  August which we'll plan to tentatively keep.  Risk Assessment/Risk Scores:     HEAR Score (for undifferentiated chest pain): N/A, certificate not in function          For questions or updates, please contact CCocoaPlease consult [email or web address] for contact info under    Signed, DCharlie Pitter PA-C  03/16/2022 4:05 PM  Personally seen and examined. Agree with APP above with the following comments:  Briefly 71yo F with single vessel CAD, prior cryptogenic stroke with PFO, HTN with DM, Hld with DM, and CKD stage IIIa with bradycardia.  Patient notes that she keeps having abdominal pain, with persistent palpitations.  She has had palpitations even while in SR in the ED today.  She was started on metoprolol but she has had variable improvement and bradycardia.  Husband notes that she had have abdominal pain issues for years. It has been getting worse.  No SOB.  No palpitations. Thought she did not have chest pain  she notes that she has had some may symptoms that everything has all run together.    Exam notable for bradycardia.  Otherwise as above.  Labs notable for troponin 22 -> 21  EKG SR with LAFB Tele: sinus bradycardia with PACs and PVCs Personally reviewed relevant tests;  No evidence of dissection or vascular calcium  Would recommend  - discussed at length with patient and husband - will stop metoprlolol - continue other home pends - continue out her ziopatch; I do not suspect to find an arrhythmogenic cause of her discomfort - discussed with ED and IM colleagues, some changes in her Bowel regimen may be helpful - optimistic that she has GI f/u Thursday; given her notable weight loss, this appears less likely to be cardiac.  Rudean Haskell, MD [address] [place], [place] [id number] [phone]  6:37 PM

## 2022-03-16 NOTE — ED Triage Notes (Signed)
Pt here with complaints of ongoing chest pain, abdominal pain as well as generalized body aches. Pt states she just does not feel well. Seen multiple times for same.

## 2022-03-16 NOTE — ED Notes (Signed)
Ambulates to the restroom at this time with a steady gait

## 2022-03-16 NOTE — ED Notes (Signed)
Pt ambulated to bathroom without difficulties

## 2022-03-18 ENCOUNTER — Other Ambulatory Visit: Payer: Self-pay

## 2022-03-18 ENCOUNTER — Emergency Department (HOSPITAL_COMMUNITY): Payer: BC Managed Care – PPO

## 2022-03-18 ENCOUNTER — Encounter (HOSPITAL_COMMUNITY): Payer: Self-pay | Admitting: Emergency Medicine

## 2022-03-18 ENCOUNTER — Telehealth: Payer: Self-pay | Admitting: Internal Medicine

## 2022-03-18 ENCOUNTER — Emergency Department (HOSPITAL_COMMUNITY)
Admission: EM | Admit: 2022-03-18 | Discharge: 2022-03-18 | Disposition: A | Discharge status: Home / Self Care | Payer: BC Managed Care – PPO | Attending: Emergency Medicine | Admitting: Emergency Medicine

## 2022-03-18 DIAGNOSIS — Z7982 Long term (current) use of aspirin: Secondary | ICD-10-CM | POA: Insufficient documentation

## 2022-03-18 DIAGNOSIS — R519 Headache, unspecified: Secondary | ICD-10-CM | POA: Diagnosis not present

## 2022-03-18 DIAGNOSIS — I10 Essential (primary) hypertension: Secondary | ICD-10-CM | POA: Diagnosis not present

## 2022-03-18 DIAGNOSIS — R42 Dizziness and giddiness: Secondary | ICD-10-CM | POA: Insufficient documentation

## 2022-03-18 DIAGNOSIS — R079 Chest pain, unspecified: Secondary | ICD-10-CM | POA: Diagnosis present

## 2022-03-18 DIAGNOSIS — H538 Other visual disturbances: Secondary | ICD-10-CM | POA: Diagnosis not present

## 2022-03-18 DIAGNOSIS — R059 Cough, unspecified: Secondary | ICD-10-CM | POA: Diagnosis not present

## 2022-03-18 DIAGNOSIS — R002 Palpitations: Secondary | ICD-10-CM | POA: Diagnosis not present

## 2022-03-18 DIAGNOSIS — E119 Type 2 diabetes mellitus without complications: Secondary | ICD-10-CM | POA: Insufficient documentation

## 2022-03-18 DIAGNOSIS — R1013 Epigastric pain: Secondary | ICD-10-CM | POA: Insufficient documentation

## 2022-03-18 DIAGNOSIS — R072 Precordial pain: Secondary | ICD-10-CM

## 2022-03-18 LAB — HEPATIC FUNCTION PANEL
ALT: 16 U/L (ref 0–44)
AST: 23 U/L (ref 15–41)
Albumin: 3.6 g/dL (ref 3.5–5.0)
Alkaline Phosphatase: 49 U/L (ref 38–126)
Bilirubin, Direct: 0.1 mg/dL (ref 0.0–0.2)
Indirect Bilirubin: 0.3 mg/dL (ref 0.3–0.9)
Total Bilirubin: 0.4 mg/dL (ref 0.3–1.2)
Total Protein: 5.9 g/dL — ABNORMAL LOW (ref 6.5–8.1)

## 2022-03-18 LAB — CBC
HCT: 36.4 % (ref 36.0–46.0)
Hemoglobin: 11.8 g/dL — ABNORMAL LOW (ref 12.0–15.0)
MCH: 30.7 pg (ref 26.0–34.0)
MCHC: 32.4 g/dL (ref 30.0–36.0)
MCV: 94.8 fL (ref 80.0–100.0)
Platelets: 216 10*3/uL (ref 150–400)
RBC: 3.84 MIL/uL — ABNORMAL LOW (ref 3.87–5.11)
RDW: 13.5 % (ref 11.5–15.5)
WBC: 7.1 10*3/uL (ref 4.0–10.5)
nRBC: 0 % (ref 0.0–0.2)

## 2022-03-18 LAB — TROPONIN I (HIGH SENSITIVITY): Troponin I (High Sensitivity): 6 ng/L (ref <18)

## 2022-03-18 LAB — BASIC METABOLIC PANEL
Anion gap: 10 (ref 5–15)
BUN: 10 mg/dL (ref 8–23)
CO2: 24 mmol/L (ref 22–32)
Calcium: 9.6 mg/dL (ref 8.9–10.3)
Chloride: 106 mmol/L (ref 98–111)
Creatinine, Ser: 1.37 mg/dL — ABNORMAL HIGH (ref 0.44–1.00)
GFR, Estimated: 42 mL/min — ABNORMAL LOW (ref >60)
Glucose, Bld: 110 mg/dL — ABNORMAL HIGH (ref 70–99)
Potassium: 4.1 mmol/L (ref 3.5–5.1)
Sodium: 140 mmol/L (ref 135–145)

## 2022-03-18 LAB — LIPASE, BLOOD: Lipase: 40 U/L (ref 11–51)

## 2022-03-18 MED ORDER — ALUM & MAG HYDROXIDE-SIMETH 200-200-20 MG/5ML PO SUSP
30.0000 mL | Freq: Once | ORAL | Status: AC
  Administered 2022-03-18: 30 mL via ORAL
  Filled 2022-03-18: qty 30

## 2022-03-18 NOTE — ED Triage Notes (Signed)
Patient here for evaluation of chest throbbing that started a few months ago. Patient states she has been seen here for same with no determination of cause of symptoms. Patient is alert, oriented, and in no apparent distress at this time.

## 2022-03-18 NOTE — Discharge Instructions (Signed)
You came to the emergency department today to be evaluated for your chronic chest and abdominal pain.  Your physical exam and lab results were reassuring.  Please follow-up with your cardiologist and gastroenterologist in the outpatient setting for further management of your chronic issues.

## 2022-03-18 NOTE — Telephone Encounter (Signed)
Inbound call from patient and spouse requesting to speak with a nurse in regarding to patient experiencing chest pains.

## 2022-03-18 NOTE — ED Provider Notes (Signed)
[facility] EMERGENCY DEPARTMENT Provider Note   CSN: [id number] Arrival date & time: 03/18/22  1050     History  Chief Complaint  Patient presents with   Chest Pain    Rachel Black is a 71 y.o. female with past medical history of diabetes mellitus, hypertension, GERD, CVA (03/2017), NSTEMI 10/2017 (medically managed.). Patient presents emergency department with a chief complaint of chest pain and epigastric abdominal pain.  Patient states that she has had constant chest pain over the last 2 to 3 months.  Patient reports that pain waxes and wanes in intensity.  Pain is located to her mid sternum and does not radiate.  Patient describes pain as a "throbbing, palpitation."  Patient states that sometimes the symptoms are worse with coughing or laying in bed.  Denies any alleviating factors.  Patient reports that pain today is no different than it has been previously.  Patient reports that her cardiologist Dr. Percival Spanish saw her on 6/26 and ordered a Zio cardiac monitor for her.  Patient has been wearing this for the last week.  Patient states that she has been dealing with epigastric abdominal pain since June 16.  Patient reports that pain is intermittent over this time.  Patient denies any change in pain today.  Patient reports that she had constipation however this improved after starting stool softeners.  Patient reports that she is scheduled to see her gastroenterologist Dr. Henrene Pastor tomorrow  Patient also endorses dizziness, generalized headache, and blurry vision.  Patient denies any fever, chills, constipation, diarrhea, blood in stool, melena, abdominal distention, shortness of breath, leg swelling or tenderness, syncope, dysuria, hematuria, urinary urgency, urinary frequency, vaginal pain, vaginal bleeding, vaginal discharge.  Patient denies any illicit drug, alcohol, or tobacco use, family history of cardiac events under the age of 61, history of PE/DVT.   Chest  Pain Associated symptoms: abdominal pain, cough, dizziness, headache and palpitations   Associated symptoms: no back pain, no fever, no nausea, no numbness, no shortness of breath, no vomiting and no weakness        Home Medications Prior to Admission medications   Medication Sig Start Date End Date Taking? Authorizing Provider  ACCU-CHEK AVIVA PLUS test strip USE AS INSTRUCTED ONCE A DAY. DX CODE E11.9 02/10/22   Lorrene Reid, PA-C  ACCU-CHEK SOFTCLIX LANCETS lancets Use as instructed once a day.  DX Code: E11.9 09/21/17   Carollee Herter, Alferd Apa, DO  allopurinol (ZYLOPRIM) 300 MG tablet Take 1 tablet (300 mg total) by mouth 2 (two) times daily. 08/26/21   Lorrene Reid, PA-C  aspirin 81 MG EC tablet Take 1 tablet (81 mg total) by mouth daily. 10/18/17   Cheryln Manly, NP  bisacodyl (DULCOLAX) 5 MG EC tablet Take 1 tablet (5 mg total) by mouth at bedtime for 14 days. 03/16/22 03/30/22  Roemhildt, Lorin T, PA-C  Calcium Carb-Cholecalciferol (CALCIUM 600-D PO) Take 1 tablet by mouth 2 (two) times daily before a meal.    [provider]  dexlansoprazole (DEXILANT) 60 MG capsule TAKE 1 CAPSULE BY MOUTH 2 TIMES DAILY. Patient taking differently: Take 60 mg by mouth in the morning and at bedtime. 12/29/21   Esterwood, [name] S, PA-C  docusate sodium (COLACE) 100 MG capsule Take 1 capsule (100 mg total) by mouth 2 (two) times daily as needed for mild constipation. 03/16/22   Roemhildt, Lorin T, PA-C  Emollient (UDDERLY SMOOTH) CREA Apply 1 application topically See admin instructions. Apply topically to skin folds as  needed for rash/irritation    [provider]  fenofibrate 160 MG tablet TAKE 1 TABLET BY MOUTH EVERY DAY Patient taking differently: Take 160 mg by mouth daily. 02/16/22   Lorrene Reid, PA-C  ferrous sulfate 325 (65 FE) MG tablet TAKE 1 TABLET BY MOUTH TWICE A DAY Patient taking differently: Take 325 mg by mouth 2 (two) times daily with a meal. 02/02/22   Zehr, Laban Emperor,  PA-C  isosorbide mononitrate (IMDUR) 60 MG 24 hr tablet Take 1 tablet (60 mg total) by mouth daily. 03/09/22   Minus Breeding, MD  JANUMET XR 50-500 MG TB24 TAKE 1 TABLET BY MOUTH EVERY DAY Patient not taking: Reported on 03/09/2022 12/26/21   Lorrene Reid, PA-C  olmesartan (BENICAR) 5 MG tablet TAKE 1 TABLET (5 MG TOTAL) BY MOUTH DAILY. 02/02/22   Lorrene Reid, PA-C  ondansetron (ZOFRAN) 4 MG tablet Take 1 tablet (4 mg total) by mouth every 8 (eight) hours as needed for nausea or vomiting. 02/27/22   Tegeler, Gwenyth Allegra, MD  pantoprazole (PROTONIX) 40 MG tablet Take 1 tablet (40 mg total) by mouth daily. 01/14/22   Irene Shipper, MD  polyethylene glycol (MIRALAX / GLYCOLAX) 17 g packet Take 17 g by mouth daily. 03/16/22   Roemhildt, Lorin T, PA-C  rosuvastatin (CRESTOR) 40 MG tablet Take 1 tablet (40 mg total) by mouth daily. PLEASE MAKE APPOINTMENT WITH CARDIOLOGIST FOR FURTHER REFILLS Patient taking differently: Take 40 mg by mouth daily. 02/03/22   Minus Breeding, MD  sucralfate (CARAFATE) 1 g tablet Take 1 tablet (1 g total) by mouth 4 (four) times daily -  with meals and at bedtime for 14 days. 03/16/22 03/30/22  Roemhildt, Lorin T, PA-C  Vitamin D, Ergocalciferol, (DRISDOL) 1.25 MG (50000 UNIT) CAPS capsule TAKE 1 CAPSULE BY MOUTH ONE TIME PER WEEK Patient taking differently: Take 50,000 Units by mouth every 7 (seven) days. Takes on Monday's 02/02/22   Lorrene Reid, PA-C      Allergies    Other and Penicillins    Review of Systems   Review of Systems  Constitutional:  Negative for chills and fever.  Eyes:  Negative for visual disturbance.  Respiratory:  Positive for cough. Negative for shortness of breath.   Cardiovascular:  Positive for chest pain and palpitations. Negative for leg swelling.  Gastrointestinal:  Positive for abdominal pain. Negative for abdominal distention, anal bleeding, blood in stool, constipation, diarrhea, nausea, rectal pain and vomiting.  Genitourinary:   Negative for difficulty urinating, dysuria, flank pain, frequency, genital sores, hematuria, urgency, vaginal bleeding, vaginal discharge and vaginal pain.  Musculoskeletal:  Negative for back pain and neck pain.  Skin:  Negative for color change and rash.  Neurological:  Positive for dizziness and headaches. Negative for tremors, seizures, syncope, facial asymmetry, speech difficulty, weakness, light-headedness and numbness.  Psychiatric/Behavioral:  Negative for confusion.     Physical Exam Updated Vital Signs BP 101/71 (BP Location: Right Arm)   Pulse 68   Temp 98.5 F (36.9 C) (Oral)   Resp 16   SpO2 95%  Physical Exam Vitals and nursing note reviewed.  Constitutional:      General: She is not in acute distress.    Appearance: She is not ill-appearing, toxic-appearing or diaphoretic.  Eyes:     General:        Right eye: No discharge.        Left eye: No discharge.     Extraocular Movements: Extraocular movements intact.     Conjunctiva/sclera:  Conjunctivae normal.     Pupils: Pupils are equal, round, and reactive to light.  Cardiovascular:     Rate and Rhythm: Normal rate.     Pulses:          Radial pulses are 2+ on the right side and 2+ on the left side.  Pulmonary:     Effort: Pulmonary effort is normal.  Abdominal:     General: Abdomen is flat. There are no signs of injury.     Palpations: Abdomen is soft. There is no mass or pulsatile mass.     Tenderness: There is no abdominal tenderness. There is no guarding or rebound.     Hernia: There is no hernia in the umbilical area or ventral area.     Comments: Minimal tenderness to epigastric area  Musculoskeletal:     Cervical back: Normal range of motion and neck supple. No rigidity.     Right lower leg: No edema.     Left lower leg: No edema.  Skin:    General: Skin is warm and dry.  Neurological:     General: No focal deficit present.     Mental Status: She is alert.     GCS: GCS eye subscore is 4. GCS verbal  subscore is 5. GCS motor subscore is 6.     Cranial Nerves: No cranial nerve deficit or facial asymmetry.     Sensory: Sensation is intact.     Motor: No weakness, tremor, seizure activity or pronator drift.     Coordination: Finger-Nose-Finger Test normal.     Comments: CN II-XII intact; performed in supine position, +5 strength to bilateral upper extremities, +5 strength to dorsiflexion and plantarflexion, patient able to lift both legs against gravity and hold each there without difficulty   Psychiatric:        Behavior: Behavior is cooperative.     ED Results / Procedures / Treatments   Labs (all labs ordered are listed, but only abnormal results are displayed) Labs Reviewed  BASIC METABOLIC PANEL - Abnormal; Notable for the following components:      Result Value   Glucose, Bld 110 (*)    Creatinine, Ser 1.37 (*)    GFR, Estimated 42 (*)    All other components within normal limits  CBC - Abnormal; Notable for the following components:   RBC 3.84 (*)    Hemoglobin 11.8 (*)    All other components within normal limits  TROPONIN I (HIGH SENSITIVITY)    EKG EKG Interpretation  Date/Time:  Wednesday March 18 2022 13:19:29 EDT Ventricular Rate:  53 PR Interval:  173 QRS Duration: 118 QT Interval:  429 QTC Calculation: 403 R Axis:   250 Text Interpretation: Sinus rhythm Incomplete RBBB and LAFB Consider anterior infarct No significant change since last tracing Confirmed by Pattricia Boss [phone]) on 03/18/2022 1:55:56 PM  Radiology DG Chest 2 View  Result Date: 03/18/2022 CLINICAL DATA:  Chest pain and shortness of breath. EXAM: CHEST - 2 VIEW COMPARISON:  03/16/2022 FINDINGS: The lungs are clear without focal pneumonia, edema, pneumothorax or pleural effusion. The cardiopericardial silhouette is within normal limits for size. The visualized bony structures of the thorax are unremarkable. IMPRESSION: No active cardiopulmonary disease. Electronically Signed   By: Misty Stanley M.D.    On: 03/18/2022 11:29    Procedures Procedures    Medications Ordered in ED Medications - No data to display  ED Course/ Medical Decision Making/ A&P Clinical Course as of 03/18/22 1447  Wed Mar 18, 2022  1416 Patient husband reports that he gets multiple calls while he is at work from wife stating that her chest pain is out of control and she is concerned she is having a heart attack.  When he returns home everything she needs to be completely back to normal with no or limited complaints of pain.  He reports that this has been a similar model over multiple days in the last 2 months [PB]    Clinical Course User Index [PB] [name], PA-C                           Medical Decision Making Amount and/or Complexity of Data Reviewed Labs: ordered. Radiology: ordered.  Risk OTC drugs.   Alert 71 year old female no acute distress, nontoxic-appearing.  Presents emergency department with a chief complaint of chest pain and epigastric abdominal pain.  Information obtained from patient patient's husband at bedside.  I reviewed patient's past medical records including previous provider notes, specialist notes, labs, and imaging.  Patient has medical history as outlined in HPI which complicates her care.  Per chart review patient has been seen 8 times for similar symptoms in 02/27/2022.  Patient saw her cardiologist on 6/26 and was placed on a cardiac monitor.  Patient reports that she has appointment with her gastroenterologist Dr. Henrene Pastor tomorrow.  Patient has had multiple CT abdomen pelvis with most recently CTA chest, abdomen, and pelvis on 7/3.  Imaging shows no acute abdominal or pelvic finding.  No evidence of acute aortic or other vascular pathology.  Due to patient's reports of chest pain ACS work-up was initiated while patient was in triage.  Additionally I will add on CMP and lipase due to reports of epigastric abdominal pain.  Patient given GI cocktail for her abdominal  pain.  I personally viewed and interpreted patient's lab results.  Pertinent findings include: -Troponin 6 -Lipase within normal limits -Liver function unremarkable -Creatinine 1.37 however this appears to be around patient's baseline -CBC shows hemoglobin 11.8; no significant change from previous  This patient's symptoms are chronic and unchanged from previous episodes we will hold any further CT imaging at this time.  Advised patient to follow-up with cardiology and her gastroenterologist for chronic issues.  Patient care discussed with attending physician Dr.Ray.  Based on patient's chief complaint, I considered admission might be necessary, however after reassuring ED workup feel patient is reasonable for discharge.  Discussed results, findings, treatment and follow up. Patient advised of return precautions. Patient verbalized understanding and agreed with plan.  Portions of this note were generated with Lobby[employer]. Dictation errors may occur despite best attempts at proofreading.         Final Clinical Impression(s) / ED Diagnoses Final diagnoses:  None    Rx / DC Orders ED Discharge Orders     None         [name], PA-C 03/18/22 1454    Pattricia Boss, MD 03/19/22 [phone]

## 2022-03-18 NOTE — ED Notes (Signed)
Reviewed discharge instructions with patient and husband. Follow-up care reviewed. Patient and husband verbalized understanding. Patient A&Ox4, VSS, and ambulatory with steady gait upon discharge.

## 2022-03-18 NOTE — ED Notes (Signed)
Pt reports ongoing recurrent episodes of central CP and LLQ abd pain. Pain is reproducible upon palpation of LLQ and central chest. Pt has a cardiac monitor on. She reports that this morning her heart felt like it was fluttering. Pt is in NAD at this time and is SB on the monitor. All VS are stable.

## 2022-03-18 NOTE — ED Notes (Signed)
Called lab to have hepatic function and lipase added to previous sample. Per Terrance in the lab, they will add tests to previous collection.

## 2022-03-18 NOTE — ED Notes (Signed)
Walked patient to the bathroom patient did well 

## 2022-03-18 NOTE — Telephone Encounter (Signed)
The pt is currently in the ED being treated

## 2022-03-19 ENCOUNTER — Encounter: Payer: Self-pay | Admitting: Physician Assistant

## 2022-03-19 ENCOUNTER — Encounter (HOSPITAL_COMMUNITY): Payer: Self-pay

## 2022-03-19 ENCOUNTER — Emergency Department (HOSPITAL_COMMUNITY): Payer: BC Managed Care – PPO

## 2022-03-19 ENCOUNTER — Emergency Department (HOSPITAL_COMMUNITY)
Admission: EM | Admit: 2022-03-19 | Discharge: 2022-03-19 | Disposition: A | Discharge status: Home / Self Care | Payer: BC Managed Care – PPO | Attending: Emergency Medicine | Admitting: Emergency Medicine

## 2022-03-19 ENCOUNTER — Ambulatory Visit (INDEPENDENT_AMBULATORY_CARE_PROVIDER_SITE_OTHER): Payer: BC Managed Care – PPO | Admitting: Physician Assistant

## 2022-03-19 VITALS — BP 118/70 | HR 95 | Ht 66.0 in | Wt 178.4 lb

## 2022-03-19 DIAGNOSIS — R079 Chest pain, unspecified: Secondary | ICD-10-CM | POA: Diagnosis not present

## 2022-03-19 DIAGNOSIS — R110 Nausea: Secondary | ICD-10-CM | POA: Diagnosis not present

## 2022-03-19 DIAGNOSIS — R109 Unspecified abdominal pain: Secondary | ICD-10-CM | POA: Insufficient documentation

## 2022-03-19 DIAGNOSIS — R1013 Epigastric pain: Secondary | ICD-10-CM

## 2022-03-19 DIAGNOSIS — I10 Essential (primary) hypertension: Secondary | ICD-10-CM | POA: Diagnosis not present

## 2022-03-19 DIAGNOSIS — R634 Abnormal weight loss: Secondary | ICD-10-CM

## 2022-03-19 DIAGNOSIS — Z79899 Other long term (current) drug therapy: Secondary | ICD-10-CM | POA: Diagnosis not present

## 2022-03-19 DIAGNOSIS — R002 Palpitations: Secondary | ICD-10-CM | POA: Insufficient documentation

## 2022-03-19 DIAGNOSIS — E119 Type 2 diabetes mellitus without complications: Secondary | ICD-10-CM | POA: Insufficient documentation

## 2022-03-19 DIAGNOSIS — F419 Anxiety disorder, unspecified: Secondary | ICD-10-CM | POA: Diagnosis not present

## 2022-03-19 DIAGNOSIS — Z7982 Long term (current) use of aspirin: Secondary | ICD-10-CM | POA: Insufficient documentation

## 2022-03-19 DIAGNOSIS — R0789 Other chest pain: Secondary | ICD-10-CM | POA: Insufficient documentation

## 2022-03-19 LAB — HEPATIC FUNCTION PANEL
ALT: 17 U/L (ref 0–44)
AST: 21 U/L (ref 15–41)
Albumin: 3.7 g/dL (ref 3.5–5.0)
Alkaline Phosphatase: 52 U/L (ref 38–126)
Bilirubin, Direct: 0.1 mg/dL (ref 0.0–0.2)
Indirect Bilirubin: 0.4 mg/dL (ref 0.3–0.9)
Total Bilirubin: 0.5 mg/dL (ref 0.3–1.2)
Total Protein: 6.4 g/dL — ABNORMAL LOW (ref 6.5–8.1)

## 2022-03-19 LAB — BASIC METABOLIC PANEL
Anion gap: 10 (ref 5–15)
BUN: 10 mg/dL (ref 8–23)
CO2: 24 mmol/L (ref 22–32)
Calcium: 9.8 mg/dL (ref 8.9–10.3)
Chloride: 106 mmol/L (ref 98–111)
Creatinine, Ser: 1.4 mg/dL — ABNORMAL HIGH (ref 0.44–1.00)
GFR, Estimated: 41 mL/min — ABNORMAL LOW (ref >60)
Glucose, Bld: 109 mg/dL — ABNORMAL HIGH (ref 70–99)
Potassium: 3.8 mmol/L (ref 3.5–5.1)
Sodium: 140 mmol/L (ref 135–145)

## 2022-03-19 LAB — CBC
HCT: 36.2 % (ref 36.0–46.0)
Hemoglobin: 11.9 g/dL — ABNORMAL LOW (ref 12.0–15.0)
MCH: 30.7 pg (ref 26.0–34.0)
MCHC: 32.9 g/dL (ref 30.0–36.0)
MCV: 93.5 fL (ref 80.0–100.0)
Platelets: 210 10*3/uL (ref 150–400)
RBC: 3.87 MIL/uL (ref 3.87–5.11)
RDW: 13.4 % (ref 11.5–15.5)
WBC: 8.2 10*3/uL (ref 4.0–10.5)
nRBC: 0 % (ref 0.0–0.2)

## 2022-03-19 LAB — TROPONIN I (HIGH SENSITIVITY)
Troponin I (High Sensitivity): 5 ng/L (ref <18)
Troponin I (High Sensitivity): 5 ng/L (ref <18)

## 2022-03-19 LAB — LIPASE, BLOOD: Lipase: 36 U/L (ref 11–51)

## 2022-03-19 MED ORDER — DEXLANSOPRAZOLE 60 MG PO CPDR: 1.0000 | DELAYED_RELEASE_CAPSULE | Freq: Every day | ORAL | 4 refills | Status: DC

## 2022-03-19 MED ORDER — ALPRAZOLAM 0.25 MG PO TABS: 0.2500 mg | ORAL_TABLET | Freq: Two times a day (BID) | ORAL | 0 refills | Status: DC | PRN

## 2022-03-19 NOTE — ED Provider Triage Note (Signed)
Emergency Medicine Provider Triage Evaluation Note  LAQUETA BONAVENTURA , a 71 y.o. female  was evaluated in triage.  Pt complains of palpitations since this morning, they have eased off but still present.  Has been seen in the ED 7 times for this in the last 2 weeks, including yesterday.  States they feel the same as previous episodes.  Denies shortness of breath, fevers, abdominal pain, or chest pain  Review of Systems  Positive:  Negative: See above  Physical Exam  BP 115/76   Pulse 87   Temp 98.9 F (37.2 C) (Oral)   Resp 17   SpO2 97%  Gen:   Awake, no distress   Resp:  Normal effort  MSK:   Moves extremities without difficulty  Other:  RRR without M/R/G.  Abdomen soft nontender.  Chest non-TTP.  Medical Decision Making  Medically screening exam initiated at 10:16 AM.  Appropriate orders placed.  [name] was informed that the remainder of the evaluation will be completed by another provider, this initial triage assessment does not replace that evaluation, and the importance of remaining in the ED until their evaluation is complete.     Prince Rome, PA-C 22/33/61 1020

## 2022-03-19 NOTE — ED Provider Notes (Signed)
[facility] EMERGENCY DEPARTMENT Provider Note   CSN: [id number] Arrival date & time: 03/19/22  0944     History  Chief Complaint  Patient presents with   Chest Pain   Palpitations    Rachel Black is a 71 y.o. female.   Chest Pain Associated symptoms: palpitations   Palpitations Associated symptoms: chest pain    Patient is a 71 year old female with a history of prior CVA, HTN, DM, GERD who presents emergency department for evaluation of chest pain, palpitations, nausea/abdominal pain.  Over the past 2 to 3 months patient has been having intermittent issues with chest pain as well as palpitations which sometimes wake her up from night.  Patient had a Zio patch placed on 6/26 which is due to be taken off on 7/14.  Patient has had 5-6 recent ED visits in the setting of ongoing issues with chest pain and palpitations.  Patient currently reports some mild chest discomfort as well as palpitations which has been waking her up from sleep.  She denies any current palpitations.  Patient also reports that she is having some epigastric abdominal discomfort with associated nausea.  She states she has not had any episodes of emesis but does intermittently feel the sensation of dry heaving.  She states that her symptoms have been ongoing for the past 6 months.  She had a CT of the abdomen and pelvis done 02/2022 which showed diverticulosis and mild fatty liver changes but was otherwise nonacute.  Patient does have a prior history of a cholecystectomy.  Patient had left the emergency department to go to a previously scheduled gastroenterology appointment over concerns of the need for an emergent endoscopy.  Per the note from the gastroenterology office visit they felt that there was no emergent need for endoscopy at that the patient could be scheduled following the completion of her Zio patch and clearance from a cardiac standpoint.  Patient ultimately returned to the emergency department  with ongoing epigastric discomfort.  Patient has been previously prescribed Zofran at home and has been using MiraLAX and Colace as needed.  She denies any recent fevers or chills.  She denies any recent known sick contacts.     Home Medications Prior to Admission medications   Medication Sig Start Date End Date Taking? Authorizing Provider  ACCU-CHEK AVIVA PLUS test strip USE AS INSTRUCTED ONCE A DAY. DX CODE E11.9 02/10/22   Lorrene Reid, PA-C  ACCU-CHEK SOFTCLIX LANCETS lancets Use as instructed once a day.  DX Code: E11.9 09/21/17   Carollee Herter, Alferd Apa, DO  allopurinol (ZYLOPRIM) 300 MG tablet Take 1 tablet (300 mg total) by mouth 2 (two) times daily. 08/26/21   Lorrene Reid, PA-C  ALPRAZolam (XANAX) 0.25 MG tablet Take 1 tablet (0.25 mg total) by mouth 2 (two) times daily as needed for anxiety. 03/19/22   Esterwood, [name] S, PA-C  aspirin 81 MG EC tablet Take 1 tablet (81 mg total) by mouth daily. 10/18/17   Cheryln Manly, NP  bisacodyl (DULCOLAX) 5 MG EC tablet Take 1 tablet (5 mg total) by mouth at bedtime for 14 days. 03/16/22 03/30/22  Roemhildt, Lorin T, PA-C  Calcium Carb-Cholecalciferol (CALCIUM 600-D PO) Take 1 tablet by mouth 2 (two) times daily before a meal.    [provider]  dexlansoprazole (DEXILANT) 60 MG capsule Take 1 capsule (60 mg total) by mouth daily. 03/19/22   Esterwood, [name] S, PA-C  docusate sodium (COLACE) 100 MG capsule Take 1 capsule (100  mg total) by mouth 2 (two) times daily as needed for mild constipation. 03/16/22   Roemhildt, Lorin T, PA-C  Emollient (UDDERLY SMOOTH) CREA Apply 1 application topically See admin instructions. Apply topically to skin folds as needed for rash/irritation    [provider]  fenofibrate 160 MG tablet TAKE 1 TABLET BY MOUTH EVERY DAY Patient taking differently: Take 160 mg by mouth daily. 02/16/22   Lorrene Reid, PA-C  ferrous sulfate 325 (65 FE) MG tablet TAKE 1 TABLET BY MOUTH TWICE A DAY Patient taking  differently: Take 325 mg by mouth 2 (two) times daily with a meal. 02/02/22   Zehr, Laban Emperor, PA-C  isosorbide mononitrate (IMDUR) 60 MG 24 hr tablet Take 1 tablet (60 mg total) by mouth daily. 03/09/22   Minus Breeding, MD  olmesartan (BENICAR) 5 MG tablet TAKE 1 TABLET (5 MG TOTAL) BY MOUTH DAILY. 02/02/22   Lorrene Reid, PA-C  ondansetron (ZOFRAN) 4 MG tablet Take 1 tablet (4 mg total) by mouth every 8 (eight) hours as needed for nausea or vomiting. 02/27/22   Tegeler, Gwenyth Allegra, MD  polyethylene glycol (MIRALAX / GLYCOLAX) 17 g packet Take 17 g by mouth daily. 03/16/22   Roemhildt, Lorin T, PA-C  rosuvastatin (CRESTOR) 40 MG tablet Take 1 tablet (40 mg total) by mouth daily. PLEASE MAKE APPOINTMENT WITH CARDIOLOGIST FOR FURTHER REFILLS Patient taking differently: Take 40 mg by mouth daily. 02/03/22   Minus Breeding, MD  sucralfate (CARAFATE) 1 g tablet Take 1 tablet (1 g total) by mouth 4 (four) times daily -  with meals and at bedtime for 14 days. 03/16/22 03/30/22  Roemhildt, Lorin T, PA-C  Vitamin D, Ergocalciferol, (DRISDOL) 1.25 MG (50000 UNIT) CAPS capsule TAKE 1 CAPSULE BY MOUTH ONE TIME PER WEEK Patient taking differently: Take 50,000 Units by mouth every 7 (seven) days. Takes on Monday's 02/02/22   Lorrene Reid, PA-C      Allergies    Other and Penicillins    Review of Systems   Review of Systems  Cardiovascular:  Positive for chest pain and palpitations.    Physical Exam Updated Vital Signs BP 136/70   Pulse 98   Temp 98.5 F (36.9 C) (Oral)   Resp 16   SpO2 100%  Physical Exam Vitals and nursing note reviewed.  Constitutional:      General: She is not in acute distress.    Appearance: She is well-developed. She is obese.  Eyes:     Conjunctiva/sclera: Conjunctivae normal.  Cardiovascular:     Rate and Rhythm: Normal rate and regular rhythm.     Pulses:          Carotid pulses are 2+ on the right side and 2+ on the left side.      Radial pulses are 2+ on the  right side and 2+ on the left side.       Dorsalis pedis pulses are 2+ on the right side and 2+ on the left side.       Posterior tibial pulses are 2+ on the right side and 2+ on the left side.     Heart sounds: No murmur heard. Pulmonary:     Effort: Pulmonary effort is normal. No respiratory distress.     Breath sounds: Normal breath sounds. No wheezing, rhonchi or rales.  Abdominal:     Palpations: Abdomen is soft.     Tenderness: There is abdominal tenderness (Mild epigastric). There is no guarding.  Musculoskeletal:  General: No swelling.     Cervical back: Neck supple.  Skin:    General: Skin is warm and dry.     Capillary Refill: Capillary refill takes less than 2 seconds.  Neurological:     Mental Status: She is alert.  Psychiatric:        Mood and Affect: Mood normal.     ED Results / Procedures / Treatments   Labs (all labs ordered are listed, but only abnormal results are displayed) Labs Reviewed  BASIC METABOLIC PANEL - Abnormal; Notable for the following components:      Result Value   Glucose, Bld 109 (*)    Creatinine, Ser 1.40 (*)    GFR, Estimated 41 (*)    All other components within normal limits  CBC - Abnormal; Notable for the following components:   Hemoglobin 11.9 (*)    All other components within normal limits  HEPATIC FUNCTION PANEL - Abnormal; Notable for the following components:   Total Protein 6.4 (*)    All other components within normal limits  LIPASE, BLOOD  TROPONIN I (HIGH SENSITIVITY)  TROPONIN I (HIGH SENSITIVITY)    EKG EKG Interpretation  Date/Time:  Thursday March 19 2022 09:47:51 EDT Ventricular Rate:  83 PR Interval:  170 QRS Duration: 100 QT Interval:  380 QTC Calculation: 446 R Axis:   -76 Text Interpretation: Normal sinus rhythm Left anterior fascicular block Abnormal ECG When compared with ECG of 18-Mar-2022 13:19, PREVIOUS ECG IS PRESENT Confirmed by Nanda Quinton [phone]) on 03/19/2022 6:04:30 PM  Radiology DG  Chest 2 View  Result Date: 03/19/2022 CLINICAL DATA:  Palpitations. EXAM: CHEST - 2 VIEW COMPARISON:  Chest x-ray March 18, 2022. FINDINGS: The heart size and mediastinal contours are within normal limits. Loop recorder projects over the left chest. Both lungs are clear. No visible pleural effusions or pneumothorax. No acute osseous abnormality. Similar mildly elevated left hemidiaphragm. IMPRESSION: No evidence of acute cardiopulmonary disease. Electronically Signed   By: Margaretha Sheffield M.D.   On: 03/19/2022 10:38   DG Chest 2 View  Result Date: 03/18/2022 CLINICAL DATA:  Chest pain and shortness of breath. EXAM: CHEST - 2 VIEW COMPARISON:  03/16/2022 FINDINGS: The lungs are clear without focal pneumonia, edema, pneumothorax or pleural effusion. The cardiopericardial silhouette is within normal limits for size. The visualized bony structures of the thorax are unremarkable. IMPRESSION: No active cardiopulmonary disease. Electronically Signed   By: Misty Stanley M.D.   On: 03/18/2022 11:29    Procedures Procedures    Medications Ordered in ED Medications - No data to display  ED Course/ Medical Decision Making/ A&P                           Medical Decision Making Problems Addressed: Nausea: chronic illness or injury with exacerbation, progression, or side effects of treatment Other chest pain: chronic illness or injury Palpitations: chronic illness or injury  Amount and/or Complexity of Data Reviewed Independent Historian: spouse External Data Reviewed: labs, radiology and notes. Labs: ordered. Radiology: ordered.   Patient is a 71 year old female who presents emergency department as above.  On initial presentation patient's vital signs are stable and she is afebrile.  Physical exam did not reveal any acute findings but did show some mild epigastric tenderness.  Baseline labs have been ordered including BMP, hepatic function panel, lipase, troponin, CBC.  Chest x-ray had also been  ordered on this patient.  Patient had actually left  the emergency department to go to a previously scheduled gastroenterology appointment at which point they stated that no emergent upper endoscopy would be felt indicated.  She would need to be cleared from a cardiovascular standpoint given she did currently have a Zio patch prior to upper endoscopy being performed.  Gastroenterology felt that there was some component of anxiety secondary to the patient's presentations and palpitations.  Baseline laboratory work did not show any abnormalities in her hepatic function panel and lipase was low limits.  BMP showed a slight elevated creatinine at 1.4 but was otherwise grossly unremarkable.  CBC within normal limits.  Chest x-ray without acute cardiopulmonary abnormalities.  Patient was instructed to continue her at home medication regime and consider Pepto-Bismol as needed at home if she has any ongoing nausea.  No additional lab work or imaging studies felt indicated at this time and patient can follow-up in the outpatient setting for additional management as needed.  Plan and findings discussed with patient at bedside and she verbalized understanding and was agreeable.  Patient discharged from the ED in stable condition.        Final Clinical Impression(s) / ED Diagnoses Final diagnoses:  Palpitations  Other chest pain  Nausea    Rx / DC Orders ED Discharge Orders     None         Nelta Numbers, MD 03/20/22 [id number]    Margette Fast, MD 03/24/22 [phone]

## 2022-03-19 NOTE — Patient Instructions (Addendum)
If you are age 70 or older, your body mass index should be between 23-30. Your Body mass index is 28.79 kg/m. If this is out of the aforementioned range listed, please consider follow up with your Primary Care Provider. ________________________________________________________  The [facility] providers would like to encourage you to use [facility] to communicate with providers for non-urgent requests or questions.  Due to long hold times on the telephone, sending your provider a message by [facility] may be a faster and more efficient way to get a response.  Please allow 48 business hours for a response.  Please remember that this is for non-urgent requests.  _______________________________________________________  Rachel Black have been scheduled for an endoscopy. Please follow written instructions given to you at your visit today. If you use inhalers (even only as needed), please bring them with you on the day of your procedure.  STOP PANTOPRAZOLE  RE-START Dexilant 60 mg 1 capsule every morning before breakfast  [name] has given you Alprazolam 0.25 mg 1 tablet twice daily as needed for anxiety, to get you through to your appointment with your Primary Care Provider.  START Miralax 1 capful daily in 8 ounces of water or juice daily  Drink multiple glasses of water daily  Take Colace as needed  Use Ondansetron 4 mg every 6 hours as needed for nausea.  Thank you for entrusting me with your care and choosing [facility].  [name] Esterwood, PA-C

## 2022-03-19 NOTE — ED Notes (Signed)
Patient verbalizes understanding of d/c instructions. Opportunities for questions and answers were provided. Pt d/c from ED and wheeled to lobby with husband.

## 2022-03-19 NOTE — ED Notes (Signed)
Pt given sprite and sandwich at bedside.

## 2022-03-19 NOTE — Progress Notes (Signed)
Subjective:    Patient ID: Rachel Black, female    DOB: [birth year], 71 y.o.   MRN: [id number]  HPI Rachel "Zigmund Black" is a 71 year old white female, established with Dr. Henrene Pastor who comes in today directly after leaving the emergency room today where she was being evaluated for complaints of chest pain and palpitations.  She has complaints of what sounds like mild epigastric discomfort, and chronic but worsening problems with constipation.  Her husband relates that her appetite has been poor for some time and she has lost about 20 pounds over the past 6 months.  He says she "eats like a bird". She does have history of chronic GERD, and it appears that she is currently taking Dexilant twice a day in addition to Protonix once a day.  Her husband says she has been eating a lot of over-the-counter Pepcid as well for intermittent complaints of indigestion.  She says she is not hungry at all, has no complaints of dysphagia or odynophagia. She has had 5-6 recent ER visits since March 12, 2022-all of these with complaints of chest pain and palpitations.  She has undergone extensive evaluation, including CT of the abdomen and pelvis which was done on 03/03/2022 which showed changes of a fatty liver, status postcholecystectomy, no ductal dilation, scattered diverticulosis no inflammatory changes. CT angiography was done of the chest on 03/16/2022 and negative, she has chronic air in the biliary tree consistent with prior sphincterotomy. Several chest x-rays have been done which are negative, including today. Labs have been unrevealing.  Today she had repeat troponin which was negative, BUN 15/creatinine 1.4 WBC 8.2/hemoglobin 0.9/hematocrit of 36. With ER visit yesterday labs were also done including hepatic panel and lipase which were both negative as were troponins.  She had cardiac consultation during her ER visit on 03/16/2022.  Noted at that time that she has history of a cryptogenic stroke in 2018, unrevealing  echo and TEE aside from PFO. She has an ILR implanted and no longer functioning but still in place.  Cardiac work-up 2019 for chest pain and NSTEMI.  Cath showed 100% occlusion of sidebranch OM 2 otherwise normal coronary anatomy, normal LVEDP.  Echo at that time with a EF of 55 to 60%. Patient does not give very good history but has stated she just does not feel well, complains of a sensation of palpitations and chest discomfort, by notes apparently more prominent in the morning.  No reliable exertional symptoms.  With 1 of these earlier ER visits a Zio patch was ordered and she is currently wearing this for 2 weeks. Telemetry monitoring showed rare PVC EKG with normal sinus rhythm, sinus bradycardia 44-60 at that time, TSH normal. .  She had been started on metoprolol and this was stopped, she is continued on Imdur 60 mg daily. Follow-up was arranged with Dr. Percival Spanish after completion of the Zio patch. Patient has been back to the ER twice since then including earlier today.  Per husband says that she keeps calling him while he is at work complaining of chest pain and "feeling like she is going to die" over when he gets home she seems to calm down and does not complain of the chest pain or palpitations is much.  He has brought her back to the emergency room each time because of the symptoms.  Per cardiology notes her current symptoms are not felt likely all secondary to cardiac disease, and GI evaluation was suggested.  Patient is irritable today and trying to  demand endoscopy be done today.  When I related that that was not possible she said she would go back to the emergency room and see if she could get an endoscopy done.  She seems to be focusing on endoscopy solving her current issues.  I asked her if she was having problems with anxiety, she is vague but says sometimes, Asked about depression and she says only because of all of these recent issues.  They do have appointment with primary care  early next week. She denies any NSAID use Last colonoscopy very recent January 2023 with 4 small polyps removed all tubular adenomas and noted multiple diverticuli. Prior EGD 2018 with multiple 5 to 10 mm gastric polyps, inflammatory appearing one of the larger polyps was removed and hemoclipped.  Biopsy consistent with hyperplastic polyps.  Review of SystemsPertinent positive and negative review of systems were noted in the above HPI section.  All other review of systems was otherwise negative.   Outpatient Encounter Medications as of 03/19/2022  Medication Sig   ACCU-CHEK AVIVA PLUS test strip USE AS INSTRUCTED ONCE A DAY. DX CODE E11.9   ACCU-CHEK SOFTCLIX LANCETS lancets Use as instructed once a day.  DX Code: E11.9   allopurinol (ZYLOPRIM) 300 MG tablet Take 1 tablet (300 mg total) by mouth 2 (two) times daily.   ALPRAZolam (XANAX) 0.25 MG tablet Take 1 tablet (0.25 mg total) by mouth 2 (two) times daily as needed for anxiety.   aspirin 81 MG EC tablet Take 1 tablet (81 mg total) by mouth daily.   bisacodyl (DULCOLAX) 5 MG EC tablet Take 1 tablet (5 mg total) by mouth at bedtime for 14 days.   Calcium Carb-Cholecalciferol (CALCIUM 600-D PO) Take 1 tablet by mouth 2 (two) times daily before a meal.   docusate sodium (COLACE) 100 MG capsule Take 1 capsule (100 mg total) by mouth 2 (two) times daily as needed for mild constipation.   Emollient (UDDERLY SMOOTH) CREA Apply 1 application topically See admin instructions. Apply topically to skin folds as needed for rash/irritation   fenofibrate 160 MG tablet TAKE 1 TABLET BY MOUTH EVERY DAY (Patient taking differently: Take 160 mg by mouth daily.)   ferrous sulfate 325 (65 FE) MG tablet TAKE 1 TABLET BY MOUTH TWICE A DAY (Patient taking differently: Take 325 mg by mouth 2 (two) times daily with a meal.)   isosorbide mononitrate (IMDUR) 60 MG 24 hr tablet Take 1 tablet (60 mg total) by mouth daily.   olmesartan (BENICAR) 5 MG tablet TAKE 1 TABLET (5  MG TOTAL) BY MOUTH DAILY.   ondansetron (ZOFRAN) 4 MG tablet Take 1 tablet (4 mg total) by mouth every 8 (eight) hours as needed for nausea or vomiting.   polyethylene glycol (MIRALAX / GLYCOLAX) 17 g packet Take 17 g by mouth daily.   rosuvastatin (CRESTOR) 40 MG tablet Take 1 tablet (40 mg total) by mouth daily. PLEASE MAKE APPOINTMENT WITH CARDIOLOGIST FOR FURTHER REFILLS (Patient taking differently: Take 40 mg by mouth daily.)   sucralfate (CARAFATE) 1 g tablet Take 1 tablet (1 g total) by mouth 4 (four) times daily -  with meals and at bedtime for 14 days.   Vitamin D, Ergocalciferol, (DRISDOL) 1.25 MG (50000 UNIT) CAPS capsule TAKE 1 CAPSULE BY MOUTH ONE TIME PER WEEK (Patient taking differently: Take 50,000 Units by mouth every 7 (seven) days. Takes on Monday's)   [DISCONTINUED] JANUMET XR 50-500 MG TB24 TAKE 1 TABLET BY MOUTH EVERY DAY   dexlansoprazole (  DEXILANT) 60 MG capsule Take 1 capsule (60 mg total) by mouth daily.   [DISCONTINUED] dexlansoprazole (DEXILANT) 60 MG capsule TAKE 1 CAPSULE BY MOUTH 2 TIMES DAILY. (Patient taking differently: Take 60 mg by mouth in the morning and at bedtime.)   [DISCONTINUED] pantoprazole (PROTONIX) 40 MG tablet Take 1 tablet (40 mg total) by mouth daily.   No facility-administered encounter medications on file as of 03/19/2022.   Allergies  Allergen Reactions   Other Other (See Comments)    Some BANDAIDS cause skin irritation    Penicillins Itching    Has patient had a PCN reaction causing immediate rash, facial/tongue/throat swelling, SOB or lightheadedness with hypotension: No Has patient had a PCN reaction causing severe rash involving mucus membranes or skin necrosis: No Has patient had a PCN reaction that required hospitalization No Has patient had a PCN reaction occurring within the last 10 years: No If all of the above answers are "NO", then may proceed with Cephalosporin use.    Patient Active Problem List   Diagnosis Date Noted    Epigastric pain 03/16/2022   Constipation 03/16/2022   Influenza A 09/16/2021   Acute cystitis without hematuria 09/16/2021   Closed nondisplaced fracture of metatarsal bone of right foot, initial encounter 92/42/6834   Acute metabolic encephalopathy 19/62/2297   Cryptogenic stroke ([place]) 01/13/2021   GERD without esophagitis 11/26/2020   Iron deficiency 11/26/2020   Generalized abdominal pain 11/26/2020   Pneumonia due to COVID-19 virus    Mixed hyperlipidemia due to type 2 diabetes mellitus ([place]) 11/27/2019   Family history of breast cancer in mother 11/27/2019   Cataract of both eyes 11/27/2019   Coronary artery disease involving native coronary artery of native heart without angina pectoris 02/18/2018   NSTEMI (non-ST elevated myocardial infarction) ([place])    Chest pain of uncertain etiology 98/92/1194   Need for shingles vaccine 09/21/2017   Need for pneumococcal vaccine 09/21/2017   History of rheumatic fever 06/30/2017   PFO (patent foramen ovale)    Cerebrovascular accident (CVA) due to embolism of right middle cerebral artery ([place]) 03/14/2017   Dilation of biliary tract    Elevated LFTs    RUQ abdominal pain    Acute gallstone pancreatitis 12/07/2016   Choledocholithiasis with obstruction 12/07/2016   AKI (acute kidney injury) ([place]) 12/07/2016   Osteoarthritis of right knee 08/23/2015   Status post total left knee replacement 08/23/2015   Arthritis of knee, right 04/20/2014   Status post total right knee replacement 04/20/2014   Flushing reaction 03/10/2012   Diabetes mellitus ([place]) 02/17/2007   Mixed hyperlipidemia 02/17/2007   GOUT 02/17/2007   Severe obesity (BMI >= 40) ([place]) 02/17/2007   Essential hypertension 02/17/2007   RHEUMATIC FEVER, HX OF 02/17/2007   Social History   Socioeconomic History   Marital status: Married    Spouse name: [name]   Number of children: 2   Years of education: Not on file   Highest education level: Not on file  Occupational  History   Occupation: housewife    Employer: UNEMPLOYED  Tobacco Use   Smoking status: Never   Smokeless tobacco: Never  Vaping Use   Vaping Use: Never used  Substance and Sexual Activity   Alcohol use: Not Currently    Comment: rarely in the past   Drug use: Never   Sexual activity: Yes    Partners: Male  Other Topics Concern   Not on file  Social History Narrative   No exercise secondary to knee  pain   Social Determinants of Health   Financial Resource Strain: Not on file  Food Insecurity: Not on file  Transportation Needs: Not on file  Physical Activity: Not on file  Stress: Not on file  Social Connections: Not on file  Intimate Partner Violence: Not on file    Ms. Bilyk's family history includes Breast cancer in her mother; Crohn's disease in her son; Diabetes in her father and mother; Heart disease in her father; Hyperlipidemia in her father; Hypertension in her mother; Irritable bowel syndrome in her son; Stroke in her father.      Objective:    Vitals:   03/19/22 1330  BP: 118/70  Pulse: 95  SpO2: 98%    Physical Exam. Well-developed well-nourished older white female in no acute distress.  Patient is in a wheelchair and accompanied by her husband  Weight, 178 BMI 28.7  HEENT; nontraumatic normocephalic, EOMI, PE R LA, sclera anicteric. Oropharynx; not examined today Neck; supple, no JVD Cardiovascular; regular rate and rhythm with S1-S2, no murmur rub or gallop Pulmonary; Clear bilaterally Abdomen; soft, mild tenderness in the epigastrium no guarding no rebound nondistended, no palpable mass or hepatosplenomegaly, bowel sounds are active Rectal; not done today Skin; benign exam, no jaundice rash or appreciable lesions Extremities; no clubbing cyanosis or edema skin warm and dry Neuro/Psych; alert and oriented x4, grossly nonfocal .  Patient is irritable, affect flat       Assessment & Plan:   #1 71 year old white female with 5-6 recent ER visits  over the past 2 weeks each time with complaints of chest pain and palpitations.  She has had fairly extensive work-up as outlined above, and has been evaluated by cardiology.  She is undergoing Zio patch monitoring, and is on Imdur.  She had been on metoprolol but this was stopped due to mild bradycardia. Troponins repeatedly negative CT angiography 03/16/2022 negative, did note chronic air in the biliary tree.  #2 vague GI complaints with epigastric and lower abdominal discomfort, constipation, lack of appetite and weight loss of about 20 pounds over the past 6 months. Again extensive evaluation has been done.  CT of the abdomen and pelvis June 2023 unremarkable other than diverticulosis and mild changes of fatty liver. She is on high-dose PPI, for chronic GERD I doubt she has any significant gastric pathology, but will need to rule out.  She is status post cholecystectomy, had prior history of choledocholithiasis.  She has not had any diet ductal dilation on recent imaging and LFTs have been normal.  I suspect she may be having significant anxiety, and/or depression, and it sounds as if she has been panicking at home when she feels the palpitations.  Patient seems dependent on her husband today for history, did not seem to know which medication she was on, and was very irritable. I am concerned with anxiety/depression  #3 history of adenomatous colon polyps-up-to-date with colonoscopy just done January 2023 4.  Diverticulosis 5.  Prior history of CVA 6.  Hypertension 8.  Adult onset diabetes mellitus  Plan; explained to the patient and her husband today that she was not going to have an endoscopy today, and that if she chose to go back to the emergency room she still would not have an endoscopy today.  I do not see any emergent need for endoscopy. We will schedule her for upper endoscopy with Dr. Henrene Pastor, in the [facility]  I explained it would be best if this be done after she completes the  Zio patch  monitoring for palpitations and gets cleared from cardiac standpoint.  She does not need to be on Dexilant and Protonix. Will change Dexilant to 60 mg once daily in the a.m. and stop Protonix She can use Zofran which they have at home 4 mg every 6 hours as needed for nausea For constipation symptoms advised MiraLAX 17 g in 8 ounces of water every day, she can take Colace 1-2 at bedtime if needed I will give her a short-term trial of Xanax 0.251 p.o. twice daily as needed for anxiety, to get them through the next few days until she can be seen by primary care early next week, at which time I think there needs to be further discussion about underlying significant anxiety, depression etc.      Gerber Penza S Kayin Osment PA-C 03/19/2022   Cc: Lorrene Reid, PA-C

## 2022-03-19 NOTE — Discharge Instructions (Signed)
You were seen in the emergency department for evaluation of ongoing issues with palpitations as well as nausea with emesis.  As we discussed we recommend continuing your Zio patch until its completion date on 7/12.  You were seen by your gastroenterologist today and they have a upper endoscopy scheduled for you on 7/26.  You are on all the appropriate medications at home.  We recommend continuing with these medications and possibly adding on Pepto-Bismol should you have any further issues with ongoing nausea.  We recommend appropriate p.o. hydration and rest.

## 2022-03-19 NOTE — ED Notes (Signed)
PATIENT HAD TO LEAVE HAS A DOCTOR  APPT.

## 2022-03-19 NOTE — ED Triage Notes (Signed)
Pt c/o ongoing L sided CP and palpitations. Pt states she has some nausea without emesis. Pt denies SOB. Currently wearing a zio patch.

## 2022-03-19 NOTE — Patient Instructions (Signed)
Mammogram A mammogram is an X-ray of the breasts. This is done to check for changes that are not normal. This test can look for changes that may be caused by breast cancer or other problems. Mammograms are regularly done on women beginning at age 70. A man may have a mammogram if he has a lump or swelling in his breast. Tell a doctor: About any allergies you have. If you have breast implants. If you have had breast disease, biopsy, or surgery. If you have a family history of breast cancer. If you are breastfeeding. Whether you are pregnant or may be pregnant. What are the risks? Generally, this is a safe procedure. But problems may occur, including: Being exposed to radiation. Radiation levels are very low with this test. The need for more tests. The results were not read properly. Trouble finding breast cancer in women with dense breasts. What happens before the test? Have this test done about 1-2 weeks after your menstrual period. This is often when your breasts are the least tender. If you are visiting a new doctor or clinic, have any past mammogram images sent to your new doctor's office. Wash your breasts and under your arms on the day of the test. Do not use deodorants, perfumes, lotions, or powders on the day of the test. Take off any jewelry from your neck. Wear clothes that you can change into and out of easily. What happens during the test?  You will take off your clothes from the waist up. You will put on a gown. You will stand in front of the X-ray machine. Each breast will be placed between two plastic or glass plates. The plates will press down on your breast for a few seconds. Try to relax. This does not cause any harm to your breasts. It may not feel comfortable, but it will be very brief. X-rays will be taken from different angles of each breast. The procedure may vary among doctors and hospitals. What can I expect after the test? The mammogram will be read by a  specialist (radiologist). You may need to do parts of the test again. This depends on the quality of the images. You may go back to your normal activities. It is up to you to get the results of your test. Ask how to get your results when they are ready. Summary A mammogram is an X-ray of the breasts. It looks for changes that may be caused by breast cancer or other problems. A man may have this test if he has a lump or swelling in his breast. Before the test, tell your doctor about any breast problems that you have had in the past. Have this test done about 1-2 weeks after your menstrual period. Ask when your test results will be ready. Make sure you get your test results. This information is not intended to replace advice given to you by your health care provider. Make sure you discuss any questions you have with your health care provider. Document Revised: 05/14/2021 Document Reviewed: 07/01/2020 Elsevier Patient Education  [place].

## 2022-03-20 NOTE — Progress Notes (Signed)
Agree with assessment and plans

## 2022-03-21 ENCOUNTER — Other Ambulatory Visit: Payer: Self-pay

## 2022-03-21 ENCOUNTER — Emergency Department (HOSPITAL_COMMUNITY): Payer: BC Managed Care – PPO

## 2022-03-21 ENCOUNTER — Encounter (HOSPITAL_COMMUNITY): Payer: Self-pay

## 2022-03-21 ENCOUNTER — Emergency Department (HOSPITAL_COMMUNITY)
Admission: EM | Admit: 2022-03-21 | Discharge: 2022-03-21 | Disposition: A | Discharge status: Home / Self Care | Payer: BC Managed Care – PPO | Attending: Emergency Medicine | Admitting: Emergency Medicine

## 2022-03-21 DIAGNOSIS — R0789 Other chest pain: Secondary | ICD-10-CM | POA: Diagnosis not present

## 2022-03-21 DIAGNOSIS — E119 Type 2 diabetes mellitus without complications: Secondary | ICD-10-CM | POA: Diagnosis not present

## 2022-03-21 DIAGNOSIS — I10 Essential (primary) hypertension: Secondary | ICD-10-CM | POA: Insufficient documentation

## 2022-03-21 DIAGNOSIS — D649 Anemia, unspecified: Secondary | ICD-10-CM | POA: Diagnosis not present

## 2022-03-21 DIAGNOSIS — R944 Abnormal results of kidney function studies: Secondary | ICD-10-CM | POA: Diagnosis not present

## 2022-03-21 DIAGNOSIS — R079 Chest pain, unspecified: Secondary | ICD-10-CM

## 2022-03-21 DIAGNOSIS — Z7982 Long term (current) use of aspirin: Secondary | ICD-10-CM | POA: Diagnosis not present

## 2022-03-21 DIAGNOSIS — R10816 Epigastric abdominal tenderness: Secondary | ICD-10-CM | POA: Diagnosis not present

## 2022-03-21 LAB — CBC
HCT: 34.7 % — ABNORMAL LOW (ref 36.0–46.0)
Hemoglobin: 11.5 g/dL — ABNORMAL LOW (ref 12.0–15.0)
MCH: 31.3 pg (ref 26.0–34.0)
MCHC: 33.1 g/dL (ref 30.0–36.0)
MCV: 94.6 fL (ref 80.0–100.0)
Platelets: 214 10*3/uL (ref 150–400)
RBC: 3.67 MIL/uL — ABNORMAL LOW (ref 3.87–5.11)
RDW: 13.5 % (ref 11.5–15.5)
WBC: 6.9 10*3/uL (ref 4.0–10.5)
nRBC: 0 % (ref 0.0–0.2)

## 2022-03-21 LAB — HEPATIC FUNCTION PANEL
ALT: 14 U/L (ref 0–44)
AST: 18 U/L (ref 15–41)
Albumin: 3.3 g/dL — ABNORMAL LOW (ref 3.5–5.0)
Alkaline Phosphatase: 47 U/L (ref 38–126)
Bilirubin, Direct: 0.1 mg/dL (ref 0.0–0.2)
Indirect Bilirubin: 0.2 mg/dL — ABNORMAL LOW (ref 0.3–0.9)
Total Bilirubin: 0.3 mg/dL (ref 0.3–1.2)
Total Protein: 5.7 g/dL — ABNORMAL LOW (ref 6.5–8.1)

## 2022-03-21 LAB — BASIC METABOLIC PANEL
Anion gap: 9 (ref 5–15)
BUN: 11 mg/dL (ref 8–23)
CO2: 26 mmol/L (ref 22–32)
Calcium: 9.7 mg/dL (ref 8.9–10.3)
Chloride: 104 mmol/L (ref 98–111)
Creatinine, Ser: 1.31 mg/dL — ABNORMAL HIGH (ref 0.44–1.00)
GFR, Estimated: 44 mL/min — ABNORMAL LOW (ref >60)
Glucose, Bld: 113 mg/dL — ABNORMAL HIGH (ref 70–99)
Potassium: 4 mmol/L (ref 3.5–5.1)
Sodium: 139 mmol/L (ref 135–145)

## 2022-03-21 LAB — LIPASE, BLOOD: Lipase: 39 U/L (ref 11–51)

## 2022-03-21 LAB — TROPONIN I (HIGH SENSITIVITY)
Troponin I (High Sensitivity): 5 ng/L (ref <18)
Troponin I (High Sensitivity): 5 ng/L (ref <18)

## 2022-03-21 NOTE — Discharge Instructions (Addendum)
Please return to the emergency room if you notice any change to the symptoms that you have been having over the past 2 months.  I recommend that you follow-up with your primary care doctor on Monday to talk about possible other causes to your symptoms.  Continue following with cardiology and gastroenterology and discuss results of Zio patch and endoscopy with specialist.

## 2022-03-21 NOTE — ED Provider Triage Note (Signed)
Emergency Medicine Provider Triage Evaluation Note  Rachel Black , a 71 y.o. female  was evaluated in triage.  Pt complains of 71 year old female here with multiple previous visits for chest pain currently wearing a Zio patch.  Complains of chest pressure, nausea and shortness of breath lasting 1 hour this morning.  Located centrally, no vomiting.  Sees Dr. Percival Spanish.  Review of Systems  Positive: Chest pain Negative: Fever  Physical Exam  BP 102/71 (BP Location: Left Arm)   Pulse 85   Temp 99.1 F (37.3 C) (Oral)   Resp 16   SpO2 100%  Gen:   Awake, no distress   Resp:  Normal effort  MSK:   Moves extremities without difficulty  Other:  No tenderness palpation  Medical Decision Making  Medically screening exam initiated at 11:38 AM.  Appropriate orders placed.  Rachel Black was informed that the remainder of the evaluation will be completed by another provider, this initial triage assessment does not replace that evaluation, and the importance of remaining in the ED until their evaluation is complete.  Patient currently asymptomatic, work-up initiated   Rachel Mail, PA-C 03/21/22 1139

## 2022-03-21 NOTE — ED Triage Notes (Signed)
Patient complains of recurrent chest pain and shortness of breath, seen on 6th for same. Patient alert and oriented, nad

## 2022-03-21 NOTE — ED Provider Notes (Signed)
[facility] EMERGENCY DEPARTMENT Provider Note   CSN: [id number] Arrival date & time: 03/21/22  1129     History PMH: HTN, HLD, DM, CVA, GERD No chief complaint on file.   Rachel Black is a 71 y.o. female. Presents with recurrent intermittent epigastric chest pains.  This has been going on for about 2 months now.  She will get about 2-3 episodes a day where she feels some discomfort in the mid of her chest and feels like her heart is beating really fast.  It typically gets better on its own.  She also has some associated epigastric abdominal tenderness around the same times.  She has not have any association with eating or drinking anything.  She has no associated shortness of breath, dizziness, numbness, weakness, visual disturbance, nausea, vomiting with this. She has been evaluated numerous times in the emergency department has had numerous negative cardiac work-ups with negative troponins.  She has been seen by cardiology outpatient and has a Zio patch on.  She turns this in on Wednesday.  She is also been seen by gastroenterology who did not really think she needed an endoscope, however she has been scheduled for this at the end of the month.  She has a PCP appointment on Monday.  She does started taking a as needed Xanax for presumed anxiety that is leading to these episodes.  She says she took 1 today and did not really think it helped her symptoms.  HPI     Home Medications Prior to Admission medications   Medication Sig Start Date End Date Taking? Authorizing Provider  ACCU-CHEK AVIVA PLUS test strip USE AS INSTRUCTED ONCE A DAY. DX CODE E11.9 02/10/22   Lorrene Reid, PA-C  ACCU-CHEK SOFTCLIX LANCETS lancets Use as instructed once a day.  DX Code: E11.9 09/21/17   Carollee Herter, Alferd Apa, DO  allopurinol (ZYLOPRIM) 300 MG tablet Take 1 tablet (300 mg total) by mouth 2 (two) times daily. 08/26/21   Lorrene Reid, PA-C  ALPRAZolam (XANAX) 0.25 MG tablet Take 1  tablet (0.25 mg total) by mouth 2 (two) times daily as needed for anxiety. 03/19/22   Esterwood, [name] S, PA-C  aspirin 81 MG EC tablet Take 1 tablet (81 mg total) by mouth daily. 10/18/17   Cheryln Manly, NP  bisacodyl (DULCOLAX) 5 MG EC tablet Take 1 tablet (5 mg total) by mouth at bedtime for 14 days. 03/16/22 03/30/22  Roemhildt, Lorin T, PA-C  Calcium Carb-Cholecalciferol (CALCIUM 600-D PO) Take 1 tablet by mouth 2 (two) times daily before a meal.    [provider]  dexlansoprazole (DEXILANT) 60 MG capsule Take 1 capsule (60 mg total) by mouth daily. 03/19/22   Esterwood, [name] S, PA-C  docusate sodium (COLACE) 100 MG capsule Take 1 capsule (100 mg total) by mouth 2 (two) times daily as needed for mild constipation. 03/16/22   Roemhildt, Lorin T, PA-C  Emollient (UDDERLY SMOOTH) CREA Apply 1 application topically See admin instructions. Apply topically to skin folds as needed for rash/irritation    [provider]  fenofibrate 160 MG tablet TAKE 1 TABLET BY MOUTH EVERY DAY Patient taking differently: Take 160 mg by mouth daily. 02/16/22   Lorrene Reid, PA-C  ferrous sulfate 325 (65 FE) MG tablet TAKE 1 TABLET BY MOUTH TWICE A DAY Patient taking differently: Take 325 mg by mouth 2 (two) times daily with a meal. 02/02/22   Zehr, Janett Billow D, PA-C  isosorbide mononitrate (IMDUR) 60 MG 24  hr tablet Take 1 tablet (60 mg total) by mouth daily. 03/09/22   Minus Breeding, MD  olmesartan (BENICAR) 5 MG tablet TAKE 1 TABLET (5 MG TOTAL) BY MOUTH DAILY. 02/02/22   Lorrene Reid, PA-C  ondansetron (ZOFRAN) 4 MG tablet Take 1 tablet (4 mg total) by mouth every 8 (eight) hours as needed for nausea or vomiting. 02/27/22   Tegeler, Gwenyth Allegra, MD  polyethylene glycol (MIRALAX / GLYCOLAX) 17 g packet Take 17 g by mouth daily. 03/16/22   Roemhildt, Lorin T, PA-C  rosuvastatin (CRESTOR) 40 MG tablet Take 1 tablet (40 mg total) by mouth daily. PLEASE MAKE APPOINTMENT WITH CARDIOLOGIST FOR FURTHER  REFILLS Patient taking differently: Take 40 mg by mouth daily. 02/03/22   Minus Breeding, MD  sucralfate (CARAFATE) 1 g tablet Take 1 tablet (1 g total) by mouth 4 (four) times daily -  with meals and at bedtime for 14 days. 03/16/22 03/30/22  Roemhildt, Lorin T, PA-C  Vitamin D, Ergocalciferol, (DRISDOL) 1.25 MG (50000 UNIT) CAPS capsule TAKE 1 CAPSULE BY MOUTH ONE TIME PER WEEK Patient taking differently: Take 50,000 Units by mouth every 7 (seven) days. Takes on Monday's 02/02/22   Lorrene Reid, PA-C      Allergies    Other and Penicillins    Review of Systems   Review of Systems  Constitutional:  Negative for chills and fever.  Eyes:  Negative for visual disturbance.  Respiratory:  Negative for cough, shortness of breath, wheezing and stridor.   Cardiovascular:  Positive for chest pain and palpitations. Negative for leg swelling.  Gastrointestinal:  Positive for abdominal pain. Negative for abdominal distention, constipation, nausea and vomiting.  Genitourinary:  Negative for dysuria and pelvic pain.  Musculoskeletal:  Negative for back pain.  Neurological:  Negative for dizziness, weakness and numbness.  All other systems reviewed and are negative.   Physical Exam Updated Vital Signs BP 104/61   Pulse (!) 56   Temp 98.7 F (37.1 C)   Resp 16   SpO2 100%  Physical Exam Vitals and nursing note reviewed.  Constitutional:      General: She is not in acute distress.    Appearance: Normal appearance. She is not ill-appearing, toxic-appearing or diaphoretic.  HENT:     Head: Normocephalic and atraumatic.     Nose: No nasal deformity.     Mouth/Throat:     Lips: Pink. No lesions.     Mouth: Mucous membranes are moist. No injury, lacerations, oral lesions or angioedema.     Pharynx: Oropharynx is clear. Uvula midline. No pharyngeal swelling, oropharyngeal exudate, posterior oropharyngeal erythema or uvula swelling.  Eyes:     General: Gaze aligned appropriately. No scleral  icterus.       Right eye: No discharge.        Left eye: No discharge.     Conjunctiva/sclera: Conjunctivae normal.     Right eye: Right conjunctiva is not injected. No exudate or hemorrhage.    Left eye: Left conjunctiva is not injected. No exudate or hemorrhage. Cardiovascular:     Rate and Rhythm: Normal rate and regular rhythm.     Pulses: Normal pulses.          Radial pulses are 2+ on the right side and 2+ on the left side.       Dorsalis pedis pulses are 2+ on the right side and 2+ on the left side.     Heart sounds: Normal heart sounds, S1 normal and S2 normal. Heart  sounds not distant. No murmur heard.    No friction rub. No gallop. No S3 or S4 sounds.  Pulmonary:     Effort: Pulmonary effort is normal. No accessory muscle usage or respiratory distress.     Breath sounds: Normal breath sounds. No stridor. No wheezing, rhonchi or rales.  Chest:     Chest wall: No tenderness.  Abdominal:     General: Abdomen is flat. There is no distension.     Palpations: Abdomen is soft. There is no mass or pulsatile mass.     Tenderness: There is abdominal tenderness. There is no right CVA tenderness, left CVA tenderness, guarding or rebound.     Comments: There is mild epigastric and right upper quadrant tenderness.  Negative Murphy's.  No peritoneal signs.  Musculoskeletal:     Right lower leg: No edema.     Left lower leg: No edema.  Skin:    General: Skin is warm and dry.     Coloration: Skin is not jaundiced or pale.     Findings: No bruising, erythema, lesion or rash.  Neurological:     General: No focal deficit present.     Mental Status: She is alert and oriented to person, place, and time.     GCS: GCS eye subscore is 4. GCS verbal subscore is 5. GCS motor subscore is 6.  Psychiatric:        Mood and Affect: Mood normal.        Behavior: Behavior normal. Behavior is cooperative.     ED Results / Procedures / Treatments   Labs (all labs ordered are listed, but only abnormal  results are displayed) Labs Reviewed  BASIC METABOLIC PANEL - Abnormal; Notable for the following components:      Result Value   Glucose, Bld 113 (*)    Creatinine, Ser 1.31 (*)    GFR, Estimated 44 (*)    All other components within normal limits  CBC - Abnormal; Notable for the following components:   RBC 3.67 (*)    Hemoglobin 11.5 (*)    HCT 34.7 (*)    All other components within normal limits  HEPATIC FUNCTION PANEL - Abnormal; Notable for the following components:   Total Protein 5.7 (*)    Albumin 3.3 (*)    Indirect Bilirubin 0.2 (*)    All other components within normal limits  LIPASE, BLOOD  TROPONIN I (HIGH SENSITIVITY)  TROPONIN I (HIGH SENSITIVITY)    EKG None  Radiology DG Chest 2 View  Result Date: 03/21/2022 CLINICAL DATA:  Recurrent chest pain and shortness of breath. EXAM: CHEST - 2 VIEW COMPARISON:  Chest x-ray dated March 19, 2022. FINDINGS: Unchanged loop recorder. The heart size and mediastinal contours are within normal limits. Normal pulmonary vascularity. No focal consolidation, pleural effusion, or pneumothorax. No acute osseous abnormality. IMPRESSION: No active cardiopulmonary disease. Electronically Signed   By: Titus Dubin M.D.   On: 03/21/2022 13:21    Procedures Procedures  This patient was on telemetry or cardiac monitoring during their time in the ED.    Medications Ordered in ED Medications - No data to display  ED Course/ Medical Decision Making/ A&P                           Medical Decision Making Amount and/or Complexity of Data Reviewed Labs: ordered.    MDM  This is a 71 y.o. female who presents to the ED  with chest/epigastric pain and palpitations that have been intermittent for two months The differential of this patient includes but is not limited to ACS, arrhythmias, anxiety, PUD, gastritis, biliary etiology, pancreatitis, etc..  Initial Impression  Well-appearing with stable vitals. Her exam is overall unremarkable  other than some epigastric and right upper quadrant tenderness with no peritoneal signs. I did review her chart and she has been evaluated numerous times for the same complaints.  She has had numerous normal lab results and cardiac work-ups.  She has seen cardiology who does not really think this is a cardiac problem but she is getting a Zio patch that still needs to be evaluated.  She is also seeing gastroenterology who does not think this is a GI related problem, but she is scheduled for endoscopy at the end of July.  Several providers have thought this is largely related to new anxiety.  She just started on as needed Xanax today for this. She has a PCP appointment Monday.  Cardiac workup was initiated in triage. I have added on Hepatic function panel and lipase with abdominal symptoms.  I personally ordered, reviewed, and interpreted all laboratory work and imaging and agree with radiologist interpretation. Results interpreted below: CBC reveals stable hemoglobin of 11.5.  BMP reveals stable creatinine of 1.31.  Hepatic function panel is with stable findings.  Lipase 39.  Troponin is 5x2 test x-ray with no acute abnormalities.  EKG is similar to prior and nonischemic.  Assessment/Plan:  Patient's work-up was unremarkable.  I reviewed prior ED evaluations along with specialist notes.  Patient has been worked up numerous times for the same complaints.  There has been absolutely no change to her symptoms.  She is asymptomatic now.  She needs to follow-up with her primary care doctor for which she has an appointment on Monday.  She also needs to continue to follow with cardiology to get the results of her Zio patch monitoring.  She is adopted continue to follow with gastroenterology to get the results of her endoscopy.  I did provide reassurance to patient and the family that I do not think an acute process is going on.  I do not see any need for admission at this time.  I recommend discharge.   Charting  Requirements Additional history is obtained from:  Independent historian and Spouse/Significant Other External Records from outside source obtained and reviewed including: GI note, cardiology note, prior labs, prior imaging Social Determinants of Health:  none Pertinant PMH that complicates patient's illness: n/a  Patient Care Problems that were addressed during this visit: - Nonspecific Chest Pain: Acute illness with systemic symptoms This patient was maintained on a cardiac monitor/telemetry. I personally viewed and interpreted the cardiac monitor which reveals an underlying rhythm of NSR Medications given in ED: n/a Reevaluation of the patient after these medicines showed that the patient resolved I have reviewed home medications and made changes accordingly.  Critical Care Interventions: n/a Consultations: n/a Disposition: discharge  Portions of this note were generated with Dragon dictation software. Dictation errors may occur despite best attempts at proofreading.      Final Clinical Impression(s) / ED Diagnoses Final diagnoses:  Nonspecific chest pain    Rx / DC Orders ED Discharge Orders     None         Adolphus Birchwood, PA-C 03/21/22 1859    Davonna Belling, MD 03/21/22 2337

## 2022-03-22 ENCOUNTER — Encounter (HOSPITAL_COMMUNITY): Payer: Self-pay

## 2022-03-22 ENCOUNTER — Other Ambulatory Visit: Payer: Self-pay

## 2022-03-22 ENCOUNTER — Emergency Department (HOSPITAL_COMMUNITY)
Admission: EM | Admit: 2022-03-22 | Discharge: 2022-03-23 | Disposition: A | Discharge status: Home / Self Care | Payer: BC Managed Care – PPO | Attending: Emergency Medicine | Admitting: Emergency Medicine

## 2022-03-22 DIAGNOSIS — R1013 Epigastric pain: Secondary | ICD-10-CM

## 2022-03-22 DIAGNOSIS — Z7982 Long term (current) use of aspirin: Secondary | ICD-10-CM | POA: Diagnosis not present

## 2022-03-22 LAB — I-STAT CHEM 8, ED
BUN: 13 mg/dL (ref 8–23)
Calcium, Ion: 1.31 mmol/L (ref 1.15–1.40)
Chloride: 103 mmol/L (ref 98–111)
Creatinine, Ser: 1.1 mg/dL — ABNORMAL HIGH (ref 0.44–1.00)
Glucose, Bld: 107 mg/dL — ABNORMAL HIGH (ref 70–99)
HCT: 36 % (ref 36.0–46.0)
Hemoglobin: 12.2 g/dL (ref 12.0–15.0)
Potassium: 3.7 mmol/L (ref 3.5–5.1)
Sodium: 139 mmol/L (ref 135–145)
TCO2: 24 mmol/L (ref 22–32)

## 2022-03-22 NOTE — ED Triage Notes (Signed)
Patient complains of recurrent chest pain and abdominal pain. Seen yesterday for same with full work-up

## 2022-03-22 NOTE — ED Provider Triage Note (Signed)
Emergency Medicine Provider Triage Evaluation Note  TACIE MCCUISTION , a 71 y.o. female  was evaluated in triage.  Pt complains of chest pain and epigastric pain.  Seen for the same yesterday and medically cleared.  Patient states "I do not know they just sent me home and I still hurt."  No new complaints..  Review of Systems  Positive: Abdominal pain and chest pain Negative: Vomiting or fevers  Physical Exam  BP 111/71 (BP Location: Right Arm)   Pulse 92   Temp 98.3 F (36.8 C) (Oral)   Resp 15   SpO2 100%  Gen:   Awake, no distress   Resp:  Normal effort  MSK:   Moves extremities without difficulty  Other:  No abdominal distention  Medical Decision Making  Medically screening exam initiated at 11:21 AM.  Appropriate orders placed.  [name] was informed that the remainder of the evaluation will be completed by another provider, this initial triage assessment does not replace that evaluation, and the importance of remaining in the ED until their evaluation is complete.  Work-up initiated   [name], PA-C 03/22/22 1122

## 2022-03-23 ENCOUNTER — Ambulatory Visit (INDEPENDENT_AMBULATORY_CARE_PROVIDER_SITE_OTHER): Payer: BC Managed Care – PPO | Admitting: Physician Assistant

## 2022-03-23 ENCOUNTER — Encounter (HOSPITAL_COMMUNITY): Payer: Self-pay | Admitting: Emergency Medicine

## 2022-03-23 ENCOUNTER — Encounter: Payer: Self-pay | Admitting: Physician Assistant

## 2022-03-23 ENCOUNTER — Telehealth: Payer: Self-pay | Admitting: Physician Assistant

## 2022-03-23 ENCOUNTER — Other Ambulatory Visit: Payer: Self-pay | Admitting: Physician Assistant

## 2022-03-23 VITALS — BP 96/65 | HR 100 | Temp 97.7°F | Ht 66.0 in | Wt 178.0 lb

## 2022-03-23 DIAGNOSIS — E1159 Type 2 diabetes mellitus with other circulatory complications: Secondary | ICD-10-CM

## 2022-03-23 DIAGNOSIS — I10 Essential (primary) hypertension: Secondary | ICD-10-CM

## 2022-03-23 DIAGNOSIS — E1169 Type 2 diabetes mellitus with other specified complication: Secondary | ICD-10-CM | POA: Diagnosis not present

## 2022-03-23 DIAGNOSIS — I152 Hypertension secondary to endocrine disorders: Secondary | ICD-10-CM

## 2022-03-23 DIAGNOSIS — Z1231 Encounter for screening mammogram for malignant neoplasm of breast: Secondary | ICD-10-CM | POA: Diagnosis not present

## 2022-03-23 DIAGNOSIS — F320 Major depressive disorder, single episode, mild: Secondary | ICD-10-CM

## 2022-03-23 DIAGNOSIS — F419 Anxiety disorder, unspecified: Secondary | ICD-10-CM | POA: Diagnosis not present

## 2022-03-23 DIAGNOSIS — E782 Mixed hyperlipidemia: Secondary | ICD-10-CM

## 2022-03-23 DIAGNOSIS — E785 Hyperlipidemia, unspecified: Secondary | ICD-10-CM

## 2022-03-23 LAB — POCT GLYCOSYLATED HEMOGLOBIN (HGB A1C): Hemoglobin A1C: 5.3 % (ref 4.0–5.6)

## 2022-03-23 MED ORDER — DICYCLOMINE HCL 20 MG PO TABS: 20.0000 mg | ORAL_TABLET | Freq: Two times a day (BID) | ORAL | 0 refills | Status: DC | PRN

## 2022-03-23 NOTE — Assessment & Plan Note (Signed)
-  Soft blood pressure in office. Discussed to monitor BP/pulse at home and keep a log. Advised to notify the office if BP consistently <100/60 for treatment adjustments (reducing or stopping Benicar 5 mg). Pt verbalized understanding.

## 2022-03-23 NOTE — Assessment & Plan Note (Signed)
-  Diet controlled. A1c 5.3. Discussed with patient having more frequent small meals with snacks in between to reduce risk of upset stomach with medications and hypoglycemia. Will continue to monitor.

## 2022-03-23 NOTE — Assessment & Plan Note (Signed)
-  Last lipid panel, LDL at goal <70. Recommend repeating lipid panel at f/up visit. Continue current medication regimen, see med list. Will continue to monitor.

## 2022-03-23 NOTE — ED Provider Notes (Signed)
[facility] EMERGENCY DEPARTMENT Provider Note   CSN: [id number] Arrival date & time: 03/22/22  1047     History  Chief Complaint  Patient presents with   Abdominal Pain    Rachel Black is a 71 y.o. female.  Patient presents to the ED again for chest/epigastric pain.  Patient has been worked up multiple times for this issue over the last couple weeks.  Has had CT scans, labs and has seen GI and cardiology as an outpatient with further appointments scheduled.  The patient complains of epigastric abdominal pain/gurgling with intermittent palpitations for the last month or so.  This exactly the same as all the previous visits.  Her husband is at bedside and offers more information.  He states that he notices that this is always in the morning.  It always was at work.  States that she will call with these exact same symptoms and then said he needs to come home and when he gets home she seems very worked up and worried and anxious.  He states usually within 20 to 30 minutes she starts to improve about time to get to the ER she is always better. He wonders if she is worried about being alone.    Abdominal Pain      Home Medications Prior to Admission medications   Medication Sig Start Date End Date Taking? Authorizing Provider  dicyclomine (BENTYL) 20 MG tablet Take 1 tablet (20 mg total) by mouth 2 (two) times daily as needed (abdominal cramping). 03/23/22  Yes Graesyn Schreifels, Corene Cornea, MD  ACCU-CHEK AVIVA PLUS test strip USE AS INSTRUCTED ONCE A DAY. DX CODE E11.9 02/10/22   Lorrene Reid, PA-C  ACCU-CHEK SOFTCLIX LANCETS lancets Use as instructed once a day.  DX Code: E11.9 09/21/17   Carollee Herter, Alferd Apa, DO  allopurinol (ZYLOPRIM) 300 MG tablet Take 1 tablet (300 mg total) by mouth 2 (two) times daily. 08/26/21   Lorrene Reid, PA-C  ALPRAZolam (XANAX) 0.25 MG tablet Take 1 tablet (0.25 mg total) by mouth 2 (two) times daily as needed for anxiety. 03/19/22   Esterwood, [name] S,  PA-C  aspirin 81 MG EC tablet Take 1 tablet (81 mg total) by mouth daily. 10/18/17   Cheryln Manly, NP  bisacodyl (DULCOLAX) 5 MG EC tablet Take 1 tablet (5 mg total) by mouth at bedtime for 14 days. 03/16/22 03/30/22  Roemhildt, Lorin T, PA-C  Calcium Carb-Cholecalciferol (CALCIUM 600-D PO) Take 1 tablet by mouth 2 (two) times daily before a meal.    [provider]  dexlansoprazole (DEXILANT) 60 MG capsule Take 1 capsule (60 mg total) by mouth daily. 03/19/22   Esterwood, [name] S, PA-C  docusate sodium (COLACE) 100 MG capsule Take 1 capsule (100 mg total) by mouth 2 (two) times daily as needed for mild constipation. 03/16/22   Roemhildt, Lorin T, PA-C  Emollient (UDDERLY SMOOTH) CREA Apply 1 application topically See admin instructions. Apply topically to skin folds as needed for rash/irritation    [provider]  fenofibrate 160 MG tablet TAKE 1 TABLET BY MOUTH EVERY DAY Patient taking differently: Take 160 mg by mouth daily. 02/16/22   Lorrene Reid, PA-C  ferrous sulfate 325 (65 FE) MG tablet TAKE 1 TABLET BY MOUTH TWICE A DAY Patient taking differently: Take 325 mg by mouth 2 (two) times daily with a meal. 02/02/22   Zehr, Laban Emperor, PA-C  isosorbide mononitrate (IMDUR) 60 MG 24 hr tablet Take 1 tablet (60 mg total) by  mouth daily. 03/09/22   Minus Breeding, MD  olmesartan (BENICAR) 5 MG tablet TAKE 1 TABLET (5 MG TOTAL) BY MOUTH DAILY. 02/02/22   Lorrene Reid, PA-C  ondansetron (ZOFRAN) 4 MG tablet Take 1 tablet (4 mg total) by mouth every 8 (eight) hours as needed for nausea or vomiting. 02/27/22   Tegeler, Gwenyth Allegra, MD  polyethylene glycol (MIRALAX / GLYCOLAX) 17 g packet Take 17 g by mouth daily. 03/16/22   Roemhildt, Lorin T, PA-C  rosuvastatin (CRESTOR) 40 MG tablet Take 1 tablet (40 mg total) by mouth daily. PLEASE MAKE APPOINTMENT WITH CARDIOLOGIST FOR FURTHER REFILLS Patient taking differently: Take 40 mg by mouth daily. 02/03/22   Minus Breeding, MD  sucralfate  (CARAFATE) 1 g tablet Take 1 tablet (1 g total) by mouth 4 (four) times daily -  with meals and at bedtime for 14 days. 03/16/22 03/30/22  Roemhildt, Lorin T, PA-C  Vitamin D, Ergocalciferol, (DRISDOL) 1.25 MG (50000 UNIT) CAPS capsule TAKE 1 CAPSULE BY MOUTH ONE TIME PER WEEK Patient taking differently: Take 50,000 Units by mouth every 7 (seven) days. Takes on Monday's 02/02/22   Lorrene Reid, PA-C      Allergies    Other and Penicillins    Review of Systems   Review of Systems  Gastrointestinal:  Positive for abdominal pain.    Physical Exam Updated Vital Signs BP 121/71 (BP Location: Right Arm)   Pulse 62   Temp 97.7 F (36.5 C) (Oral)   Resp 16   SpO2 100%  Physical Exam Vitals and nursing note reviewed.  Constitutional:      Appearance: She is well-developed.  HENT:     Head: Normocephalic and atraumatic.  Cardiovascular:     Rate and Rhythm: Normal rate and regular rhythm.  Pulmonary:     Effort: No respiratory distress.     Breath sounds: No stridor.  Abdominal:     General: Bowel sounds are normal. There is no distension.     Palpations: Abdomen is soft. There is no shifting dullness, fluid wave, hepatomegaly or splenomegaly.  Musculoskeletal:     Cervical back: Normal range of motion.  Neurological:     Mental Status: She is alert.     ED Results / Procedures / Treatments   Labs (all labs ordered are listed, but only abnormal results are displayed) Labs Reviewed  I-STAT CHEM 8, ED - Abnormal; Notable for the following components:      Result Value   Creatinine, Ser 1.10 (*)    Glucose, Bld 107 (*)    All other components within normal limits    EKG EKG Interpretation  Date/Time:  Sunday March 22 2022 11:21:01 EDT Ventricular Rate:  95 PR Interval:  168 QRS Duration: 98 QT Interval:  364 QTC Calculation: 457 R Axis:   -83 Text Interpretation: Normal sinus rhythm Low voltage QRS Left anterior fascicular block Possible Anterolateral infarct , age  undetermined Abnormal ECG When compared with ECG of 21-Mar-2022 11:36, PREVIOUS ECG IS PRESENT Confirmed by Merrily Pew [phone]) on 03/22/2022 11:19:26 PM  Radiology DG Chest 2 View  Result Date: 03/21/2022 CLINICAL DATA:  Recurrent chest pain and shortness of breath. EXAM: CHEST - 2 VIEW COMPARISON:  Chest x-ray dated March 19, 2022. FINDINGS: Unchanged loop recorder. The heart size and mediastinal contours are within normal limits. Normal pulmonary vascularity. No focal consolidation, pleural effusion, or pneumothorax. No acute osseous abnormality. IMPRESSION: No active cardiopulmonary disease. Electronically Signed   By: Orville Govern.D.  On: 03/21/2022 13:21    Procedures Procedures    Medications Ordered in ED Medications - No data to display  ED Course/ Medical Decision Making/ A&P                           Medical Decision Making Risk Prescription drug management.   Did not feel it necessary to repeat the same workup that has been done multiple times in the past few weeks. This is the exact same symptom that has been resolved for 14 hours prior to arrival. Suspect this is anxiety as it seems directly correlated with husband being gone and better when he returns. Not sure why this changed June 16.    Final Clinical Impression(s) / ED Diagnoses Final diagnoses:  Epigastric pain    Rx / DC Orders ED Discharge Orders          Ordered    dicyclomine (BENTYL) 20 MG tablet  2 times daily PRN        03/23/22 0117              Drevon Plog, Corene Cornea, MD 03/23/22 0221

## 2022-03-23 NOTE — Discharge Instructions (Signed)
I wonder if your symptoms are related to being alone somehow. I would discuss this with your primary doctor. You also need to ensure you see your cardiologist and gastroenterologist to definitively rule out those as sources for your symptoms, however it needs to be considered that this could be anxiety related.

## 2022-03-23 NOTE — Telephone Encounter (Signed)
Patient requesting refill for her accu chek aviva lancets. Please advise.

## 2022-03-23 NOTE — Progress Notes (Signed)
Established patient visit   Patient: Rachel Black   DOB: [birth year]   71 y.o. Female  MRN: [id number] Visit Date: 03/23/2022  Chief Complaint  Patient presents with   Diabetes   Subjective    HPI  Patient presents for chronic follow-up. Patient reports feeling anxious and depressed with everything she has going on right now. Denies feeling anxious or depressed in the past. Reports continues to have decreased appetite. Still eating small meals- 2 per day. Went to the ED last night for upper abdominal pain. Patient is followed by gastroenterology and scheduled for EGD 04/10/22.   Diabetes: Pt denies increased urination or thirst. No hypoglycemic events. Checking glucose at home. FBS have been less than 89-111.    HTN: Pt currently has Holter monitor to evaluate palpitations. No lower extremity swelling. Taking medication as directed without side effects. Unable to recall home BP readings.   HLD: Pt taking medication as directed without issues. No myalgias. No changes with diet.   Medications: Outpatient Medications Prior to Visit  Medication Sig   ACCU-CHEK AVIVA PLUS test strip USE AS INSTRUCTED ONCE A DAY. DX CODE E11.9   ACCU-CHEK SOFTCLIX LANCETS lancets Use as instructed once a day.  DX Code: E11.9   allopurinol (ZYLOPRIM) 300 MG tablet Take 1 tablet (300 mg total) by mouth 2 (two) times daily.   ALPRAZolam (XANAX) 0.25 MG tablet Take 1 tablet (0.25 mg total) by mouth 2 (two) times daily as needed for anxiety.   aspirin 81 MG EC tablet Take 1 tablet (81 mg total) by mouth daily.   bisacodyl (DULCOLAX) 5 MG EC tablet Take 1 tablet (5 mg total) by mouth at bedtime for 14 days.   Calcium Carb-Cholecalciferol (CALCIUM 600-D PO) Take 1 tablet by mouth 2 (two) times daily before a meal.   dexlansoprazole (DEXILANT) 60 MG capsule Take 1 capsule (60 mg total) by mouth daily.   dicyclomine (BENTYL) 20 MG tablet Take 1 tablet (20 mg total) by mouth 2 (two) times daily as needed  (abdominal cramping).   docusate sodium (COLACE) 100 MG capsule Take 1 capsule (100 mg total) by mouth 2 (two) times daily as needed for mild constipation.   Emollient (UDDERLY SMOOTH) CREA Apply 1 application topically See admin instructions. Apply topically to skin folds as needed for rash/irritation   fenofibrate 160 MG tablet TAKE 1 TABLET BY MOUTH EVERY DAY (Patient taking differently: Take 160 mg by mouth daily.)   ferrous sulfate 325 (65 FE) MG tablet TAKE 1 TABLET BY MOUTH TWICE A DAY (Patient taking differently: Take 325 mg by mouth 2 (two) times daily with a meal.)   isosorbide mononitrate (IMDUR) 60 MG 24 hr tablet Take 1 tablet (60 mg total) by mouth daily.   olmesartan (BENICAR) 5 MG tablet TAKE 1 TABLET (5 MG TOTAL) BY MOUTH DAILY.   ondansetron (ZOFRAN) 4 MG tablet Take 1 tablet (4 mg total) by mouth every 8 (eight) hours as needed for nausea or vomiting.   polyethylene glycol (MIRALAX / GLYCOLAX) 17 g packet Take 17 g by mouth daily.   rosuvastatin (CRESTOR) 40 MG tablet Take 1 tablet (40 mg total) by mouth daily. PLEASE MAKE APPOINTMENT WITH CARDIOLOGIST FOR FURTHER REFILLS (Patient taking differently: Take 40 mg by mouth daily.)   sucralfate (CARAFATE) 1 g tablet Take 1 tablet (1 g total) by mouth 4 (four) times daily -  with meals and at bedtime for 14 days.   Vitamin D, Ergocalciferol, (DRISDOL) 1.25 MG (50000 UNIT) CAPS  capsule TAKE 1 CAPSULE BY MOUTH ONE TIME PER WEEK (Patient taking differently: Take 50,000 Units by mouth every 7 (seven) days. Takes on Monday's)   No facility-administered medications prior to visit.    Review of Systems Review of Systems:  A fourteen system review of systems was performed and found to be positive as per HPI.  Last CBC Lab Results  Component Value Date   WBC 6.9 03/21/2022   HGB 12.2 03/22/2022   HCT 36.0 03/22/2022   MCV 94.6 03/21/2022   MCH 31.3 03/21/2022   RDW 13.5 03/21/2022   PLT 214 60/06/9322   Last metabolic panel Lab  Results  Component Value Date   GLUCOSE 107 (H) 03/22/2022   NA 139 03/22/2022   K 3.7 03/22/2022   CL 103 03/22/2022   CO2 26 03/21/2022   BUN 13 03/22/2022   CREATININE 1.10 (H) 03/22/2022   GFRNONAA 44 (L) 03/21/2022   CALCIUM 9.7 03/21/2022   PHOS 2.9 09/18/2021   PROT 5.7 (L) 03/21/2022   ALBUMIN 3.3 (L) 03/21/2022   LABGLOB 2.1 09/25/2021   AGRATIO 2.0 09/25/2021   BILITOT 0.3 03/21/2022   ALKPHOS 47 03/21/2022   AST 18 03/21/2022   ALT 14 03/21/2022   ANIONGAP 9 03/21/2022   Last lipids Lab Results  Component Value Date   CHOL 119 08/26/2021   HDL 31 (L) 08/26/2021   LDLCALC 53 08/26/2021   LDLDIRECT 63 11/27/2019   TRIG 213 (H) 08/26/2021   CHOLHDL 3.8 08/26/2021   Last hemoglobin A1c Lab Results  Component Value Date   HGBA1C 5.3 03/23/2022   Last thyroid functions Lab Results  Component Value Date   TSH 1.073 02/27/2022      03/23/2022    8:28 AM 12/24/2021    3:08 PM 09/25/2021    2:47 PM 04/28/2021    3:38 PM 11/20/2020    4:26 PM  Depression screen PHQ 2/9  Decreased Interest 1 1 0 1 2  Down, Depressed, Hopeless 1 1 0 0 0  PHQ - 2 Score 2 2 0 1 2  Altered sleeping 1 1 1 2 3   Tired, decreased energy 1 3 1 2 3   Change in appetite 1 3 1 1 1   Feeling bad or failure about yourself  1 1 1  0 0  Trouble concentrating 1 1 1  0 0  Moving slowly or fidgety/restless 1 1 1  0 0  Suicidal thoughts 0 0 0 0 0  PHQ-9 Score 8 12 6 6 9   Difficult doing work/chores Somewhat difficult Not difficult at all Not difficult at all Somewhat difficult       03/23/2022    8:28 AM 12/24/2021    3:08 PM 09/25/2021    2:47 PM 04/28/2021    3:39 PM  GAD 7 : Generalized Anxiety Score  Nervous, Anxious, on Edge 1 1 0 0  Control/stop worrying 1 1 1  0  Worry too much - different things 1 1 0 1  Trouble relaxing 1 1 0 0  Restless 0 0 0 0  Easily annoyed or irritable 0 0 0 1  Afraid - awful might happen 1 1 0 1  Total GAD 7 Score 5 5 1 3   Anxiety Difficulty Somewhat difficult  Somewhat difficult Not difficult at all Somewhat difficult        Objective    BP 96/65   Pulse 100   Temp 97.7 F (36.5 C)   Ht 5' 6"  (1.676 m)   Wt 178 lb (  80.7 kg)   SpO2 97%   BMI 28.73 kg/m  BP Readings from Last 3 Encounters:  03/23/22 96/65  03/23/22 121/71  03/21/22 109/64   Wt Readings from Last 3 Encounters:  03/23/22 178 lb (80.7 kg)  03/19/22 178 lb 6.4 oz (80.9 kg)  03/09/22 175 lb 3.2 oz (79.5 kg)    Physical Exam  General:  Cooperative, in no acute distress, non-diaphoretic  Neuro:  Alert and oriented,  extra-ocular muscles intact  HEENT:  Normocephalic, atraumatic, neck supple  Skin:  no gross rash, warm, pink. Cardiac:  RRR, S1 S2 Respiratory: CTA B/L  Vascular:  Ext warm, no cyanosis apprec.; cap RF less 2 sec. Psych:  No HI/SI, judgement and insight good, dysthymic mood. Flat Affect.   Results for orders placed or performed in visit on 03/23/22  POCT glycosylated hemoglobin (Hb A1C)  Result Value Ref Range   Hemoglobin A1C 5.3 4.0 - 5.6 %   HbA1c POC (<> result, manual entry)     HbA1c, POC (prediabetic range)     HbA1c, POC (controlled diabetic range)    Results for orders placed or performed during the hospital encounter of 03/22/22  I-stat chem 8, ED  Result Value Ref Range   Sodium 139 135 - 145 mmol/L   Potassium 3.7 3.5 - 5.1 mmol/L   Chloride 103 98 - 111 mmol/L   BUN 13 8 - 23 mg/dL   Creatinine, Ser 1.10 (H) 0.44 - 1.00 mg/dL   Glucose, Bld 107 (H) 70 - 99 mg/dL   Calcium, Ion 1.31 1.15 - 1.40 mmol/L   TCO2 24 22 - 32 mmol/L   Hemoglobin 12.2 12.0 - 15.0 g/dL   HCT 36.0 36.0 - 46.0 %    Assessment & Plan      Problem List Items Addressed This Visit       Cardiovascular and Mediastinum   Hypertension associated with diabetes ([place])    -Soft blood pressure in office. Discussed to monitor BP/pulse at home and keep a log. Advised to notify the office if BP consistently <100/60 for treatment adjustments (reducing or stopping  Benicar 5 mg). Pt verbalized understanding.        Endocrine   Diabetes mellitus ([place]) - Primary (Chronic)    -Diet controlled. A1c 5.3. Discussed with patient having more frequent small meals with snacks in between to reduce risk of upset stomach with medications and hypoglycemia. Will continue to monitor.      Relevant Orders   POCT glycosylated hemoglobin (Hb A1C) (Completed)   Mixed hyperlipidemia due to type 2 diabetes mellitus (HCC)    -Last lipid panel, LDL at goal <70. Recommend repeating lipid panel at f/up visit. Continue current medication regimen, see med list. Will continue to monitor.      Other Visit Diagnoses     Encounter for screening mammogram for malignant neoplasm of breast       Relevant Orders   MM Digital Screening   Anxiety       Current mild episode of major depressive disorder without prior episode (HCC)          Anxiety, Current mild episode of major depressive disorder without prior episode: -Mildly elevated PHQ-9 and GAD-7 scores. No SI/HI. Discussed with patient s/sx associated with anxiety/panic attack and MDD. Patient seems reluctant to starting medication therapy. Advised ED work-up essentially negative for finding etiology of various symptoms. Highly encourage to re-consider starting medication therapy such as SSRI/SNRI for mood, especially if Holter monitor study normal.  Of note, patient unsure if has rx for alprazolam, will check at home. Discussed alternatives to benzodiazepine therapy for prn medications.    Return in about 4 months (around 07/24/2022) for HTN, Mood.        Lorrene Reid, PA-C  [facility] [phone] (phone) [phone] (fax)  [place]

## 2022-03-24 ENCOUNTER — Other Ambulatory Visit: Payer: Self-pay

## 2022-03-24 DIAGNOSIS — E1169 Type 2 diabetes mellitus with other specified complication: Secondary | ICD-10-CM

## 2022-03-24 MED ORDER — ACCU-CHEK SOFTCLIX LANCETS MISC: 1 refills | Status: AC

## 2022-03-24 MED ORDER — ROSUVASTATIN CALCIUM 40 MG PO TABS: 40.0000 mg | ORAL_TABLET | Freq: Every day | ORAL | 2 refills | Status: DC

## 2022-03-24 NOTE — Telephone Encounter (Signed)
Rx refill sent to pharmacy. AS, CMA

## 2022-03-25 ENCOUNTER — Encounter (HOSPITAL_COMMUNITY): Payer: Self-pay

## 2022-03-25 ENCOUNTER — Ambulatory Visit: Payer: BC Managed Care – PPO | Admitting: Physician Assistant

## 2022-03-25 ENCOUNTER — Emergency Department (HOSPITAL_COMMUNITY)
Admission: EM | Admit: 2022-03-25 | Discharge: 2022-03-26 | Disposition: A | Discharge status: Home / Self Care | Payer: BC Managed Care – PPO | Attending: Emergency Medicine | Admitting: Emergency Medicine

## 2022-03-25 ENCOUNTER — Emergency Department (HOSPITAL_COMMUNITY): Payer: BC Managed Care – PPO

## 2022-03-25 ENCOUNTER — Other Ambulatory Visit: Payer: Self-pay

## 2022-03-25 DIAGNOSIS — Z7982 Long term (current) use of aspirin: Secondary | ICD-10-CM | POA: Diagnosis not present

## 2022-03-25 DIAGNOSIS — R10816 Epigastric abdominal tenderness: Secondary | ICD-10-CM | POA: Diagnosis not present

## 2022-03-25 DIAGNOSIS — E119 Type 2 diabetes mellitus without complications: Secondary | ICD-10-CM | POA: Insufficient documentation

## 2022-03-25 DIAGNOSIS — R944 Abnormal results of kidney function studies: Secondary | ICD-10-CM | POA: Diagnosis not present

## 2022-03-25 DIAGNOSIS — R0789 Other chest pain: Secondary | ICD-10-CM | POA: Diagnosis present

## 2022-03-25 DIAGNOSIS — R079 Chest pain, unspecified: Secondary | ICD-10-CM

## 2022-03-25 DIAGNOSIS — I10 Essential (primary) hypertension: Secondary | ICD-10-CM | POA: Diagnosis not present

## 2022-03-25 LAB — CBC WITH DIFFERENTIAL/PLATELET
Abs Immature Granulocytes: 0.03 10*3/uL (ref 0.00–0.07)
Basophils Absolute: 0 10*3/uL (ref 0.0–0.1)
Basophils Relative: 0 %
Eosinophils Absolute: 0 10*3/uL (ref 0.0–0.5)
Eosinophils Relative: 0 %
HCT: 37.1 % (ref 36.0–46.0)
Hemoglobin: 12.1 g/dL (ref 12.0–15.0)
Immature Granulocytes: 1 %
Lymphocytes Relative: 27 %
Lymphs Abs: 1.6 10*3/uL (ref 0.7–4.0)
MCH: 30.4 pg (ref 26.0–34.0)
MCHC: 32.6 g/dL (ref 30.0–36.0)
MCV: 93.2 fL (ref 80.0–100.0)
Monocytes Absolute: 0.3 10*3/uL (ref 0.1–1.0)
Monocytes Relative: 5 %
Neutro Abs: 4 10*3/uL (ref 1.7–7.7)
Neutrophils Relative %: 67 %
Platelets: 206 10*3/uL (ref 150–400)
RBC: 3.98 MIL/uL (ref 3.87–5.11)
RDW: 13.2 % (ref 11.5–15.5)
WBC: 6 10*3/uL (ref 4.0–10.5)
nRBC: 0 % (ref 0.0–0.2)

## 2022-03-25 LAB — TROPONIN I (HIGH SENSITIVITY)
Troponin I (High Sensitivity): 5 ng/L (ref <18)
Troponin I (High Sensitivity): 4 ng/L (ref <18)

## 2022-03-25 LAB — COMPREHENSIVE METABOLIC PANEL
ALT: 17 U/L (ref 0–44)
AST: 26 U/L (ref 15–41)
Albumin: 3.5 g/dL (ref 3.5–5.0)
Alkaline Phosphatase: 49 U/L (ref 38–126)
Anion gap: 11 (ref 5–15)
BUN: 16 mg/dL (ref 8–23)
CO2: 25 mmol/L (ref 22–32)
Calcium: 10.3 mg/dL (ref 8.9–10.3)
Chloride: 106 mmol/L (ref 98–111)
Creatinine, Ser: 1.2 mg/dL — ABNORMAL HIGH (ref 0.44–1.00)
GFR, Estimated: 49 mL/min — ABNORMAL LOW (ref >60)
Glucose, Bld: 107 mg/dL — ABNORMAL HIGH (ref 70–99)
Potassium: 4.2 mmol/L (ref 3.5–5.1)
Sodium: 142 mmol/L (ref 135–145)
Total Bilirubin: 0.3 mg/dL (ref 0.3–1.2)
Total Protein: 5.9 g/dL — ABNORMAL LOW (ref 6.5–8.1)

## 2022-03-25 LAB — LIPASE, BLOOD: Lipase: 46 U/L (ref 11–51)

## 2022-03-25 NOTE — ED Triage Notes (Signed)
Complains of recurrent palpiation and chest pain along with nausea.  Patient has been see numerous times for same.

## 2022-03-25 NOTE — ED Provider Triage Note (Addendum)
Emergency Medicine Provider Triage Evaluation Note  Rachel Black , a 71 y.o. female  was evaluated in triage.  Pt complains of chest pain, palpitations, and abdominal pain.  Patient reports that she has been dealing with these issues intermittently over the last few weeks.  Patient reports that she thought that her chest pain was worse this morning which prompted her to come to the emergency department.  Chest pain started at 8 AM and has been constant since then.  Pain wax and wanes in intensity.  Pain is located throughout her entire chest.  Patient reports pain does radiate into her abdomen.  Review of Systems  Positive: Chest pain, abdominal pain, palpitations Negative: Lightheadedness, syncope, nausea, vomiting, fever, chills  Physical Exam  BP 97/66   Pulse 88   Temp 98.9 F (37.2 C) (Oral)   Resp 16   Ht 5' 6"  (1.676 m)   Wt 80.7 kg   SpO2 100%   BMI 28.73 kg/m  Gen:   Awake, no distress   Resp:  Normal effort  MSK:   Moves extremities without difficulty  Other:  +2 radial pulse bilaterally.  Abdomen soft, nondistended, nontender with no guarding or rebound tenderness.  Medical Decision Making  Medically screening exam initiated at 1:29 PM.  Appropriate orders placed.  Rachel Black was informed that the remainder of the evaluation will be completed by another provider, this initial triage assessment does not replace that evaluation, and the importance of remaining in the ED until their evaluation is complete.  Per chart review patient has been seen multiple times for similar complaints.  Patient has followed up with cardiologist and gastroenterologist in outpatient setting.  ACS work-up initiated.   Rachel Beckwith, PA-C 03/25/22 1330    Rachel Beckwith, PA-C 03/25/22 1333

## 2022-03-26 ENCOUNTER — Ambulatory Visit: Payer: BC Managed Care – PPO

## 2022-03-26 MED ORDER — LACTATED RINGERS IV BOLUS
1000.0000 mL | Freq: Once | INTRAVENOUS | Status: AC
  Administered 2022-03-26: 1000 mL via INTRAVENOUS

## 2022-03-26 NOTE — ED Provider Notes (Signed)
[facility] Provider Note   CSN: [id number] Arrival date & time: 03/25/22  1313     History  Chief Complaint  Patient presents with   Palpitations    Rachel Black is a 71 y.o. female.  HPI 71 year old female with history of DM, gout, hypertension, gallstone pancreatitis, NSTEMI, hyperlipidemia, PFO presents to the ER with complaints of epigastric and chest pain which radiates to the back.  Patient states that the pain started around 8 AM this morning, and waxed and waned through the morning.  Symptoms have self resolved, she has no pain at present.  Per chart review, she has had multiple visits to the ER, with multiple negative abdominal and cardiac work-ups.  She is been followed by cardiology and has a Zio patch on.  She also has been seen by cardiology and has an endoscopy pending.  Her husband relates that he thinks this is anxiety related as her symptoms start when he leaves for work, she calls him when he is at work stating she has 10 out of 10 chest pain, and her symptoms resolved when he gets back to the house.  Per chart review, she had a visit on 7/8 and 7/10 with the same complaint.    Home Medications Prior to Admission medications   Medication Sig Start Date End Date Taking? Authorizing Provider  ACCU-CHEK AVIVA PLUS test strip USE AS INSTRUCTED ONCE A DAY. DX CODE E11.9 02/10/22   Lorrene Reid, PA-C  Accu-Chek Softclix Lancets lancets Use as instructed once a day.  DX Code: E11.9 03/24/22   Lorrene Reid, PA-C  allopurinol (ZYLOPRIM) 300 MG tablet Take 1 tablet (300 mg total) by mouth 2 (two) times daily. 08/26/21   Lorrene Reid, PA-C  ALPRAZolam (XANAX) 0.25 MG tablet Take 1 tablet (0.25 mg total) by mouth 2 (two) times daily as needed for anxiety. 03/19/22   Esterwood, [name] S, PA-C  aspirin 81 MG EC tablet Take 1 tablet (81 mg total) by mouth daily. 10/18/17   Cheryln Manly, NP  bisacodyl (DULCOLAX) 5 MG EC tablet Take 1  tablet (5 mg total) by mouth at bedtime for 14 days. 03/16/22 03/30/22  Roemhildt, Lorin T, PA-C  Calcium Carb-Cholecalciferol (CALCIUM 600-D PO) Take 1 tablet by mouth 2 (two) times daily before a meal.    [provider]  dexlansoprazole (DEXILANT) 60 MG capsule Take 1 capsule (60 mg total) by mouth daily. 03/19/22   Esterwood, [name] S, PA-C  dicyclomine (BENTYL) 20 MG tablet Take 1 tablet (20 mg total) by mouth 2 (two) times daily as needed (abdominal cramping). 03/23/22   Mesner, Corene Cornea, MD  docusate sodium (COLACE) 100 MG capsule Take 1 capsule (100 mg total) by mouth 2 (two) times daily as needed for mild constipation. 03/16/22   Roemhildt, Lorin T, PA-C  Emollient (UDDERLY SMOOTH) CREA Apply 1 application topically See admin instructions. Apply topically to skin folds as needed for rash/irritation    [provider]  fenofibrate 160 MG tablet TAKE 1 TABLET BY MOUTH EVERY DAY Patient taking differently: Take 160 mg by mouth daily. 02/16/22   Lorrene Reid, PA-C  ferrous sulfate 325 (65 FE) MG tablet TAKE 1 TABLET BY MOUTH TWICE A DAY Patient taking differently: Take 325 mg by mouth 2 (two) times daily with a meal. 02/02/22   Zehr, Laban Emperor, PA-C  isosorbide mononitrate (IMDUR) 60 MG 24 hr tablet Take 1 tablet (60 mg total) by mouth daily. 03/09/22   Minus Breeding,  MD  olmesartan (BENICAR) 5 MG tablet TAKE 1 TABLET (5 MG TOTAL) BY MOUTH DAILY. 02/02/22   Lorrene Reid, PA-C  ondansetron (ZOFRAN) 4 MG tablet Take 1 tablet (4 mg total) by mouth every 8 (eight) hours as needed for nausea or vomiting. 02/27/22   Tegeler, Gwenyth Allegra, MD  polyethylene glycol (MIRALAX / GLYCOLAX) 17 g packet Take 17 g by mouth daily. 03/16/22   Roemhildt, Lorin T, PA-C  rosuvastatin (CRESTOR) 40 MG tablet Take 1 tablet (40 mg total) by mouth daily. 03/24/22   Minus Breeding, MD  sucralfate (CARAFATE) 1 g tablet Take 1 tablet (1 g total) by mouth 4 (four) times daily -  with meals and at bedtime for 14 days.  03/16/22 03/30/22  Roemhildt, Lorin T, PA-C  Vitamin D, Ergocalciferol, (DRISDOL) 1.25 MG (50000 UNIT) CAPS capsule TAKE 1 CAPSULE BY MOUTH ONE TIME PER WEEK Patient taking differently: Take 50,000 Units by mouth every 7 (seven) days. Takes on Monday's 02/02/22   Lorrene Reid, PA-C      Allergies    Other and Penicillins    Review of Systems   Review of Systems Ten systems reviewed and are negative for acute change, except as noted in the HPI.   Physical Exam Updated Vital Signs BP 112/62   Pulse (!) 54   Temp 98.2 F (36.8 C) (Oral)   Resp 12   Ht 5' 6"  (1.676 m)   Wt 80.7 kg   SpO2 100%   BMI 28.73 kg/m  Physical Exam Vitals and nursing note reviewed.  Constitutional:      General: She is not in acute distress.    Appearance: She is well-developed.  HENT:     Head: Normocephalic and atraumatic.  Eyes:     Conjunctiva/sclera: Conjunctivae normal.  Cardiovascular:     Rate and Rhythm: Normal rate and regular rhythm.     Heart sounds: No murmur heard. Pulmonary:     Effort: Pulmonary effort is normal. No respiratory distress.     Breath sounds: Normal breath sounds.  Abdominal:     Palpations: Abdomen is soft.     Tenderness: There is abdominal tenderness.     Comments: Minimal epigastric tenderness on exam, no guarding, negative Murphy sign   Musculoskeletal:        General: No swelling.     Cervical back: Neck supple.  Skin:    General: Skin is warm and dry.     Capillary Refill: Capillary refill takes less than 2 seconds.  Neurological:     General: No focal deficit present.     Mental Status: She is alert and oriented to person, place, and time.  Psychiatric:        Mood and Affect: Mood normal.     ED Results / Procedures / Treatments   Labs (all labs ordered are listed, but only abnormal results are displayed) Labs Reviewed  COMPREHENSIVE METABOLIC PANEL - Abnormal; Notable for the following components:      Result Value   Glucose, Bld 107 (*)     Creatinine, Ser 1.20 (*)    Total Protein 5.9 (*)    GFR, Estimated 49 (*)    All other components within normal limits  CBC WITH DIFFERENTIAL/PLATELET  LIPASE, BLOOD  TROPONIN I (HIGH SENSITIVITY)  TROPONIN I (HIGH SENSITIVITY)    EKG EKG Interpretation  Date/Time:  Wednesday March 25 2022 13:31:39 EDT Ventricular Rate:  85 PR Interval:  174 QRS Duration: 100 QT Interval:  380 QTC  Calculation: 452 R Axis:   -76 Text Interpretation: Normal sinus rhythm Left anterior fascicular block Cannot rule out Anterior infarct , age undetermined Abnormal ECG When compared with ECG of 03/22/2022, No significant change was found Confirmed by Delora Fuel ([id number]) on 03/26/2022 12:46:45 AM  Radiology DG Chest 2 View  Result Date: 03/25/2022 CLINICAL DATA:  Chest pain, shortness of breath EXAM: CHEST - 2 VIEW COMPARISON:  03/21/2022 FINDINGS: The heart size and mediastinal contours are within normal limits. Both lungs are clear. The visualized skeletal structures are unremarkable. There are cardiac monitoring devices in left chest wall. IMPRESSION: No active cardiopulmonary disease. Electronically Signed   By: Elmer Picker M.D.   On: 03/25/2022 14:14    Procedures Procedures    Medications Ordered in ED Medications  lactated ringers bolus 1,000 mL (1,000 mLs Intravenous New Bag/Given 03/26/22 0128)    ED Course/ Medical Decision Making/ A&P                           Medical Decision Making 71 year old female presents to the ER with complaints of epigastric/chest pain.  She has been seen here multiple times with the same complaint, with negative cardiac and abdominal work-ups.  She is currently followed by cardiology and has a Zio patch on.  She also has pending endoscopy with GI.  Her vitals were stable on presentation, however her blood pressure did slowly decrease through her ER stay.  She remains asymptomatic, states she has not had anything to eat or drink today.  Labs ordered in triage,  reviewed and interpreted by me.  CBC unremarkable, CMP with a creatinine 1.2 which appears to be at baseline.  Delta troponins negative.  Lipase normal.  Chest x-ray without any evidence of acute findings.  EKG nonischemic.  I discussed the patient's presentation with her and her husband at bedside, she has appropriate follow-up, is wearing a Zio patch.  Low suspicion for ACS, dissection, PE.  I also have low suspicion for acute intra-abdominal process including cholecystitis, pancreatitis, SBO.  She was given 1 L fluid bolus with improvement in her blood pressure.  Suspect this was hypovolemic in nature.  Provided reassurance to her and her husband.  I offered a GI cocktail however the patient states that her symptoms has resolved and deferred this.  She has an appointment with cardiology to get her Zio patch off today and has pending endoscopy next week.  We discussed return precautions.  I discussed the case with Dr. Roxanne Mins who is agreeable to the above plan and disposition.  She voiced understanding and is agreeable.  Stable for discharge.  Final Clinical Impression(s) / ED Diagnoses Final diagnoses:  Chest pain, unspecified type    Rx / DC Orders ED Discharge Orders     None         Lyndel Safe 32/35/57 3220    Delora Fuel, MD 25/42/70 [id number]    Delora Fuel, MD 62/83/15 [phone]

## 2022-03-26 NOTE — Discharge Instructions (Signed)
Your work-up today was overall reassuring.  Please make sure to follow-up with your cardiologist and your gastroenterologist.  Return to the ER for any new or worsening symptoms.

## 2022-03-29 ENCOUNTER — Other Ambulatory Visit: Payer: Self-pay | Admitting: Physician Assistant

## 2022-03-31 ENCOUNTER — Ambulatory Visit: Payer: BC Managed Care – PPO | Admitting: Physician Assistant

## 2022-04-02 ENCOUNTER — Other Ambulatory Visit: Payer: Self-pay | Admitting: Physician Assistant

## 2022-04-03 ENCOUNTER — Other Ambulatory Visit: Payer: Self-pay | Admitting: Physician Assistant

## 2022-04-03 DIAGNOSIS — E1159 Type 2 diabetes mellitus with other circulatory complications: Secondary | ICD-10-CM

## 2022-04-04 ENCOUNTER — Emergency Department (HOSPITAL_COMMUNITY)
Admission: EM | Admit: 2022-04-04 | Discharge: 2022-04-04 | Disposition: A | Discharge status: Home / Self Care | Payer: BC Managed Care – PPO | Attending: Emergency Medicine | Admitting: Emergency Medicine

## 2022-04-04 ENCOUNTER — Encounter (HOSPITAL_COMMUNITY): Payer: Self-pay | Admitting: Emergency Medicine

## 2022-04-04 ENCOUNTER — Other Ambulatory Visit: Payer: Self-pay

## 2022-04-04 ENCOUNTER — Emergency Department (HOSPITAL_COMMUNITY): Payer: BC Managed Care – PPO

## 2022-04-04 DIAGNOSIS — R002 Palpitations: Secondary | ICD-10-CM

## 2022-04-04 DIAGNOSIS — I493 Ventricular premature depolarization: Secondary | ICD-10-CM | POA: Insufficient documentation

## 2022-04-04 DIAGNOSIS — Z7982 Long term (current) use of aspirin: Secondary | ICD-10-CM | POA: Insufficient documentation

## 2022-04-04 DIAGNOSIS — R072 Precordial pain: Secondary | ICD-10-CM

## 2022-04-04 DIAGNOSIS — R0789 Other chest pain: Secondary | ICD-10-CM | POA: Diagnosis present

## 2022-04-04 LAB — CBC
HCT: 31.3 % — ABNORMAL LOW (ref 36.0–46.0)
Hemoglobin: 10.3 g/dL — ABNORMAL LOW (ref 12.0–15.0)
MCH: 30.7 pg (ref 26.0–34.0)
MCHC: 32.9 g/dL (ref 30.0–36.0)
MCV: 93.2 fL (ref 80.0–100.0)
Platelets: 262 10*3/uL (ref 150–400)
RBC: 3.36 MIL/uL — ABNORMAL LOW (ref 3.87–5.11)
RDW: 13.1 % (ref 11.5–15.5)
WBC: 5.4 10*3/uL (ref 4.0–10.5)
nRBC: 0 % (ref 0.0–0.2)

## 2022-04-04 LAB — BASIC METABOLIC PANEL
Anion gap: 9 (ref 5–15)
BUN: 13 mg/dL (ref 8–23)
CO2: 22 mmol/L (ref 22–32)
Calcium: 9.9 mg/dL (ref 8.9–10.3)
Chloride: 108 mmol/L (ref 98–111)
Creatinine, Ser: 1.24 mg/dL — ABNORMAL HIGH (ref 0.44–1.00)
GFR, Estimated: 47 mL/min — ABNORMAL LOW (ref >60)
Glucose, Bld: 105 mg/dL — ABNORMAL HIGH (ref 70–99)
Potassium: 3.9 mmol/L (ref 3.5–5.1)
Sodium: 139 mmol/L (ref 135–145)

## 2022-04-04 LAB — MAGNESIUM: Magnesium: 2.1 mg/dL (ref 1.7–2.4)

## 2022-04-04 LAB — TROPONIN I (HIGH SENSITIVITY): Troponin I (High Sensitivity): 5 ng/L (ref <18)

## 2022-04-04 LAB — TSH: TSH: 1.845 u[IU]/mL (ref 0.350–4.500)

## 2022-04-04 MED ORDER — POTASSIUM CHLORIDE 20 MEQ PO PACK
40.0000 meq | PACK | Freq: Once | ORAL | Status: AC
  Administered 2022-04-04: 40 meq via ORAL
  Filled 2022-04-04: qty 2

## 2022-04-04 MED ORDER — ACETAMINOPHEN 500 MG PO TABS
1000.0000 mg | ORAL_TABLET | Freq: Once | ORAL | Status: AC
  Administered 2022-04-04: 1000 mg via ORAL
  Filled 2022-04-04: qty 2

## 2022-04-04 NOTE — ED Provider Notes (Signed)
[facility] EMERGENCY DEPARTMENT Provider Note   CSN: [id number] Arrival date & time: 04/04/22  1359     History  Chief Complaint  Patient presents with   Chest Pain    Rachel Black is a 71 y.o. female.  Pt c/o sense of palpitations, 'pounding' heart beat, and mild discomfort, this AM, at rest. Lasted a few hours. Now improved. No exertional chest pain or discomfort. No sob or unusual doe. No associated diaphoresis or nv. No faintness or syncope. Notes several recent visits for same and outpatient zio monitor. Denies any constant or pleuritic pain. No leg pain or swelling. No heartburn. No fever or chills.   The history is provided by the patient, medical records and the spouse.  Chest Pain Associated symptoms: palpitations   Associated symptoms: no abdominal pain, no back pain, no cough, no fever, no shortness of breath and no vomiting        Home Medications Prior to Admission medications   Medication Sig Start Date End Date Taking? Authorizing Provider  ACCU-CHEK AVIVA PLUS test strip USE AS INSTRUCTED ONCE A DAY. DX CODE E11.9 02/10/22   Lorrene Reid, PA-C  Accu-Chek Softclix Lancets lancets Use as instructed once a day.  DX Code: E11.9 03/24/22   Lorrene Reid, PA-C  allopurinol (ZYLOPRIM) 300 MG tablet Take 1 tablet (300 mg total) by mouth 2 (two) times daily. 08/26/21   Lorrene Reid, PA-C  ALPRAZolam (XANAX) 0.25 MG tablet Take 1 tablet (0.25 mg total) by mouth 2 (two) times daily as needed for anxiety. 03/19/22   Esterwood, [name] S, PA-C  aspirin 81 MG EC tablet Take 1 tablet (81 mg total) by mouth daily. 10/18/17   Cheryln Manly, NP  Calcium Carb-Cholecalciferol (CALCIUM 600-D PO) Take 1 tablet by mouth 2 (two) times daily before a meal.    [provider]  dexlansoprazole (DEXILANT) 60 MG capsule Take 1 capsule (60 mg total) by mouth daily. 03/19/22   Esterwood, [name] S, PA-C  dicyclomine (BENTYL) 20 MG tablet Take 1 tablet (20 mg total)  by mouth 2 (two) times daily as needed (abdominal cramping). 03/23/22   Mesner, Corene Cornea, MD  docusate sodium (COLACE) 100 MG capsule Take 1 capsule (100 mg total) by mouth 2 (two) times daily as needed for mild constipation. 03/16/22   Roemhildt, Lorin T, PA-C  Emollient (UDDERLY SMOOTH) CREA Apply 1 application topically See admin instructions. Apply topically to skin folds as needed for rash/irritation    [provider]  fenofibrate 160 MG tablet TAKE 1 TABLET BY MOUTH EVERY DAY Patient taking differently: Take 160 mg by mouth daily. 02/16/22   Lorrene Reid, PA-C  ferrous sulfate 325 (65 FE) MG tablet TAKE 1 TABLET BY MOUTH TWICE A DAY Patient taking differently: Take 325 mg by mouth 2 (two) times daily with a meal. 02/02/22   Zehr, Laban Emperor, PA-C  isosorbide mononitrate (IMDUR) 60 MG 24 hr tablet Take 1 tablet (60 mg total) by mouth daily. 03/09/22   Minus Breeding, MD  olmesartan (BENICAR) 5 MG tablet TAKE 1 TABLET (5 MG TOTAL) BY MOUTH DAILY. 04/03/22   Lorrene Reid, PA-C  ondansetron (ZOFRAN) 4 MG tablet Take 1 tablet (4 mg total) by mouth every 8 (eight) hours as needed for nausea or vomiting. 02/27/22   Tegeler, Gwenyth Allegra, MD  polyethylene glycol (MIRALAX / GLYCOLAX) 17 g packet Take 17 g by mouth daily. 03/16/22   Roemhildt, Lorin T, PA-C  rosuvastatin (CRESTOR) 40 MG tablet Take  1 tablet (40 mg total) by mouth daily. 03/24/22   Minus Breeding, MD  sucralfate (CARAFATE) 1 g tablet Take 1 tablet (1 g total) by mouth 4 (four) times daily -  with meals and at bedtime for 14 days. 03/16/22 03/30/22  Roemhildt, Lorin T, PA-C  Vitamin D, Ergocalciferol, (DRISDOL) 1.25 MG (50000 UNIT) CAPS capsule TAKE 1 CAPSULE BY MOUTH ONE TIME PER WEEK Patient taking differently: Take 50,000 Units by mouth every 7 (seven) days. Takes on Monday's 02/02/22   Lorrene Reid, PA-C      Allergies    Other and Penicillins    Review of Systems   Review of Systems  Constitutional:  Negative for chills and  fever.  HENT:  Negative for sore throat.   Eyes:  Negative for redness.  Respiratory:  Negative for cough and shortness of breath.   Cardiovascular:  Positive for chest pain and palpitations. Negative for leg swelling.  Gastrointestinal:  Negative for abdominal pain and vomiting.  Genitourinary:  Negative for flank pain.  Musculoskeletal:  Negative for back pain and neck pain.  Skin:  Negative for rash.  Neurological:  Negative for syncope.  Hematological:  Does not bruise/bleed easily.  Psychiatric/Behavioral:  Negative for confusion.     Physical Exam Updated Vital Signs BP 104/67 (BP Location: Left Arm)   Pulse 83   Temp 98.6 F (37 C)   Resp 14   SpO2 99%  Physical Exam Vitals and nursing note reviewed.  Constitutional:      Appearance: Normal appearance. She is well-developed.  HENT:     Head: Atraumatic.     Nose: Nose normal.     Mouth/Throat:     Mouth: Mucous membranes are moist.  Eyes:     General: No scleral icterus.    Conjunctiva/sclera: Conjunctivae normal.  Neck:     Trachea: No tracheal deviation.     Comments: Trachea midline. Thyroid not grossly enlarged or tender.  Cardiovascular:     Rate and Rhythm: Normal rate and regular rhythm.     Pulses: Normal pulses.     Heart sounds: Normal heart sounds. No murmur heard.    No friction rub. No gallop.  Pulmonary:     Effort: Pulmonary effort is normal. No respiratory distress.     Breath sounds: Normal breath sounds.  Abdominal:     General: Bowel sounds are normal. There is no distension.     Palpations: Abdomen is soft.     Tenderness: There is no abdominal tenderness. There is no guarding.  Genitourinary:    Comments: No cva tenderness.  Musculoskeletal:        General: No swelling or tenderness.     Cervical back: Normal range of motion and neck supple. No rigidity. No muscular tenderness.     Right lower leg: No edema.     Left lower leg: No edema.  Skin:    General: Skin is warm and dry.      Findings: No rash.  Neurological:     Mental Status: She is alert.     Comments: Alert, speech normal.   Psychiatric:        Mood and Affect: Mood normal.     ED Results / Procedures / Treatments   Labs (all labs ordered are listed, but only abnormal results are displayed) Results for orders placed or performed during the hospital encounter of 28/78/67  Basic metabolic panel  Result Value Ref Range   Sodium 139 135 - 145 mmol/L  Potassium 3.9 3.5 - 5.1 mmol/L   Chloride 108 98 - 111 mmol/L   CO2 22 22 - 32 mmol/L   Glucose, Bld 105 (H) 70 - 99 mg/dL   BUN 13 8 - 23 mg/dL   Creatinine, Ser 1.24 (H) 0.44 - 1.00 mg/dL   Calcium 9.9 8.9 - 10.3 mg/dL   GFR, Estimated 47 (L) >60 mL/min   Anion gap 9 5 - 15  CBC  Result Value Ref Range   WBC 5.4 4.0 - 10.5 K/uL   RBC 3.36 (L) 3.87 - 5.11 MIL/uL   Hemoglobin 10.3 (L) 12.0 - 15.0 g/dL   HCT 31.3 (L) 36.0 - 46.0 %   MCV 93.2 80.0 - 100.0 fL   MCH 30.7 26.0 - 34.0 pg   MCHC 32.9 30.0 - 36.0 g/dL   RDW 13.1 11.5 - 15.5 %   Platelets 262 150 - 400 K/uL   nRBC 0.0 0.0 - 0.2 %  Magnesium  Result Value Ref Range   Magnesium 2.1 1.7 - 2.4 mg/dL  TSH  Result Value Ref Range   TSH 1.845 0.350 - 4.500 uIU/mL  Troponin I (High Sensitivity)  Result Value Ref Range   Troponin I (High Sensitivity) 5 <18 ng/L   DG Chest 2 View  Result Date: 04/04/2022 CLINICAL DATA:  Chest pain, palpitations EXAM: CHEST - 2 VIEW COMPARISON:  03/25/2022 FINDINGS: The heart size and mediastinal contours are within normal limits. Both lungs are clear. Increased density seen in the anterior aspect of mid lung fields seen in the lateral projection most likely is an artifact caused by superimposition of soft tissues of the patient's upper extremities. This finding is not evident in the AP view. The visualized skeletal structures are unremarkable. IMPRESSION: No active cardiopulmonary disease. Electronically Signed   By: Elmer Picker M.D.   On: 04/04/2022  14:49   DG Chest 2 View  Result Date: 03/25/2022 CLINICAL DATA:  Chest pain, shortness of breath EXAM: CHEST - 2 VIEW COMPARISON:  03/21/2022 FINDINGS: The heart size and mediastinal contours are within normal limits. Both lungs are clear. The visualized skeletal structures are unremarkable. There are cardiac monitoring devices in left chest wall. IMPRESSION: No active cardiopulmonary disease. Electronically Signed   By: Elmer Picker M.D.   On: 03/25/2022 14:14   DG Chest 2 View  Result Date: 03/21/2022 CLINICAL DATA:  Recurrent chest pain and shortness of breath. EXAM: CHEST - 2 VIEW COMPARISON:  Chest x-ray dated March 19, 2022. FINDINGS: Unchanged loop recorder. The heart size and mediastinal contours are within normal limits. Normal pulmonary vascularity. No focal consolidation, pleural effusion, or pneumothorax. No acute osseous abnormality. IMPRESSION: No active cardiopulmonary disease. Electronically Signed   By: Titus Dubin M.D.   On: 03/21/2022 13:21   DG Chest 2 View  Result Date: 03/19/2022 CLINICAL DATA:  Palpitations. EXAM: CHEST - 2 VIEW COMPARISON:  Chest x-ray March 18, 2022. FINDINGS: The heart size and mediastinal contours are within normal limits. Loop recorder projects over the left chest. Both lungs are clear. No visible pleural effusions or pneumothorax. No acute osseous abnormality. Similar mildly elevated left hemidiaphragm. IMPRESSION: No evidence of acute cardiopulmonary disease. Electronically Signed   By: Margaretha Sheffield M.D.   On: 03/19/2022 10:38   DG Chest 2 View  Result Date: 03/18/2022 CLINICAL DATA:  Chest pain and shortness of breath. EXAM: CHEST - 2 VIEW COMPARISON:  03/16/2022 FINDINGS: The lungs are clear without focal pneumonia, edema, pneumothorax or pleural effusion. The cardiopericardial  silhouette is within normal limits for size. The visualized bony structures of the thorax are unremarkable. IMPRESSION: No active cardiopulmonary disease.  Electronically Signed   By: Misty Stanley M.D.   On: 03/18/2022 11:29   CT Angio Chest/Abd/Pel for Dissection W and/or Wo Contrast  Result Date: 03/16/2022 CLINICAL DATA:  Chest pain and back pain. Aortic dissection suspected. EXAM: CT ANGIOGRAPHY CHEST, ABDOMEN AND PELVIS TECHNIQUE: Non-contrast CT of the chest was initially obtained. Multidetector CT imaging through the chest, abdomen and pelvis was performed using the standard protocol during bolus administration of intravenous contrast. Multiplanar reconstructed images and MIPs were obtained and reviewed to evaluate the vascular anatomy. RADIATION DOSE REDUCTION: This exam was performed according to the departmental dose-optimization program which includes automated exposure control, adjustment of the mA and/or kV according to patient size and/or use of iterative reconstruction technique. CONTRAST:  112m OMNIPAQUE IOHEXOL 350 MG/ML SOLN COMPARISON:  Chest radiography same day. CT abdomen pelvis 03/03/2022. FINDINGS: CTA CHEST FINDINGS Cardiovascular: Heart size upper limits of normal. No visible coronary artery calcification. No pericardial fluid. No atherosclerotic change of the aorta. No evidence of aneurysm or dissection. Pulmonary arteries appear normal. Mediastinum/Nodes: No mass or lymphadenopathy. Lungs/Pleura: No pleural effusion. The lungs are clear. No underlying emphysema. No infiltrate or collapse. No mass or nodule. Musculoskeletal: Relative elevation of the left hemidiaphragm. Ordinary mild thoracic degenerative changes. No evidence of rib fracture. Review of the MIP images confirms the above findings. CTA ABDOMEN AND PELVIS FINDINGS VASCULAR Aorta: The aorta is normal. No atherosclerotic change. No aneurysm or dissection. Celiac: Normal SMA: Normal Renals: Normal IMA: Normal Inflow: Normal Veins: Normal Review of the MIP images confirms the above findings. NON-VASCULAR Hepatobiliary: Previous cholecystectomy. Chronic air in the biliary tree  presumably secondary to previous sphincterotomy, as seen previously. No significant finding. Pancreas: Normal Spleen: Normal Adrenals/Urinary Tract: Normal adrenal glands. Mild renal cortical scarring. No stone or hydronephrosis. Bladder is normal. Stomach/Bowel: Stomach and small intestine are normal. Diverticulosis without evidence of diverticulitis. Lymphatic: No adenopathy. Reproductive: Previous hysterectomy.  No pelvic mass. Other: No free fluid or air. Musculoskeletal: Ordinary lumbar degenerative findings. Review of the MIP images confirms the above findings. IMPRESSION: No evidence of acute aortic or other vascular pathology. Patient does not show atherosclerotic disease of the aorta or the coronary arteries. The lungs are clear. No acute abdominal or pelvic finding. Ordinary mild degenerative changes of the spine. Electronically Signed   By: MNelson ChimesM.D.   On: 03/16/2022 12:45   DG Chest 2 View  Result Date: 03/16/2022 CLINICAL DATA:  Chest pain EXAM: CHEST - 2 VIEW COMPARISON:  Radiograph 03/12/2022 FINDINGS: Unchanged device overlying the left upper chest. Unchanged cardiomediastinal silhouette. No focal airspace disease. No pleural effusion. No pneumothorax. There is no acute osseous abnormality. Thoracic spondylosis. IMPRESSION: No evidence of acute cardiopulmonary disease. Electronically Signed   By: JMaurine SimmeringM.D.   On: 03/16/2022 09:46   DG Chest 2 View  Result Date: 03/12/2022 CLINICAL DATA:  Chest pain EXAM: CHEST - 2 VIEW COMPARISON:  Chest radiograph dated March 08, 2022 FINDINGS: The heart size and mediastinal contours are within normal limits. Both lungs are clear. The visualized skeletal structures are unremarkable. Battery device and a loop recorder in the left anterior chest wall. IMPRESSION: No active cardiopulmonary disease. Electronically Signed   By: IKeane PoliceD.O.   On: 03/12/2022 12:52   DG Chest 2 View  Result Date: 03/08/2022 CLINICAL DATA:  Chest pain. EXAM:  CHEST - 2  VIEW COMPARISON:  03/07/2022 FINDINGS: The heart size and mediastinal contours are within normal limits. Both lungs are clear. The visualized skeletal structures are unremarkable. IMPRESSION: No active cardiopulmonary disease. Electronically Signed   By: Marlaine Hind M.D.   On: 03/08/2022 12:08   DG Chest 2 View  Result Date: 03/07/2022 CLINICAL DATA:  Chest pain. EXAM: CHEST - 2 VIEW COMPARISON:  Chest x-ray 03/05/2022 FINDINGS: The heart size and mediastinal contours are within normal limits. Both lungs are clear. The visualized skeletal structures are unremarkable. IMPRESSION: No active cardiopulmonary disease. Electronically Signed   By: Ronney Asters M.D.   On: 03/07/2022 18:08     EKG EKG Interpretation  Date/Time:  Saturday April 04 2022 14:08:49 EDT Ventricular Rate:  79 PR Interval:  178 QRS Duration: 96 QT Interval:  372 QTC Calculation: 426 R Axis:   -69 Text Interpretation: Normal sinus rhythm Left anterior fascicular block Nonspecific T wave abnormality Confirmed by Lajean Saver [phone]) on 04/04/2022 8:21:24 PM  Radiology DG Chest 2 View  Result Date: 04/04/2022 CLINICAL DATA:  Chest pain, palpitations EXAM: CHEST - 2 VIEW COMPARISON:  03/25/2022 FINDINGS: The heart size and mediastinal contours are within normal limits. Both lungs are clear. Increased density seen in the anterior aspect of mid lung fields seen in the lateral projection most likely is an artifact caused by superimposition of soft tissues of the patient's upper extremities. This finding is not evident in the AP view. The visualized skeletal structures are unremarkable. IMPRESSION: No active cardiopulmonary disease. Electronically Signed   By: Elmer Picker M.D.   On: 04/04/2022 14:49    Procedures Procedures    Medications Ordered in ED Medications  potassium chloride (KLOR-CON) packet 40 mEq (has no administration in time range)  acetaminophen (TYLENOL) tablet 1,000 mg (has no administration  in time range)    ED Course/ Medical Decision Making/ A&P                           Medical Decision Making Problems Addressed: Palpitations: acute illness or injury with systemic symptoms Precordial chest pain: acute illness or injury with systemic symptoms that poses a threat to life or bodily functions PVC (premature ventricular contraction): acute illness or injury  Amount and/or Complexity of Data Reviewed Independent Historian: spouse    Details: hx External Data Reviewed: notes. Labs: ordered. Decision-making details documented in ED Course. Radiology: ordered and independent interpretation performed. Decision-making details documented in ED Course. ECG/medicine tests: ordered and independent interpretation performed. Decision-making details documented in ED Course.  Risk OTC drugs. Prescription drug management. Decision regarding hospitalization.   Iv ns. Continuous pulse ox and cardiac monitoring. Labs ordered/sent. Imaging ordered.   Diff dx includes dysrhythmia, pvcs, acs, msk cp, etc - dispo decision including possible need for admission considered if elev trop, worrisome dysrhythmia, etc - will get labs and imaging and reassess.   Reviewed nursing notes and prior charts for additional history. External reports reviewed. Outpatient zio - frequent pvcs. Additional history from: spouse.   Cardiac monitor: sinus rhythm, rate 82.  Labs reviewed/interpreted by me -  wbc normal. Tsh normal. Trop normal - felt not c/w acs.   Xrays reviewed/interpreted by me - no pna.   Acetaminophen po. Kcl po.  Rec outpatient cardiology f/u.  Return precautions provided.            Final Clinical Impression(s) / ED Diagnoses Final diagnoses:  None    Rx / DC Orders  ED Discharge Orders     None         Lajean Saver, MD 04/04/22 2023

## 2022-04-04 NOTE — Discharge Instructions (Addendum)
It was our pleasure to provide your ER care today - we hope that you feel better.  Follow up closely with your doctor/cardiologist in the next 1-2 weeks.  Return to ER if worse, new symptoms, persistent fast heart beating, fainting, recurrent/persistent chest pain, increased trouble breathing, or other emergency concern.

## 2022-04-04 NOTE — ED Triage Notes (Signed)
Pt states that her "heart has been acting up" today.  Pt states she has been evaluated multiple times for same  CP with nausea, no vomiting, "fluttering"

## 2022-04-04 NOTE — ED Provider Triage Note (Signed)
Emergency Medicine Provider Triage Evaluation Note  Rachel Black , a 71 y.o. female  was evaluated in triage.  Pt complains of CP and palpitations, onset earlier this morning, has since resolved. Multiple episodes previously, several ER work ups without source identified. Followed up with cariology who did a monitor but does not know results.  Seeing Dr. Henrene Black Friday for stomach problems with plan for endoscopy.  Review of Systems  Positive: CP, palpitations Negative: SHOB, fever, vomiting  Physical Exam  BP 105/76   Pulse 82   Temp 98.1 F (36.7 C) (Oral)   Resp 14   SpO2 99%  Gen:   Awake, no distress   Resp:  Normal effort  MSK:   Moves extremities without difficulty  Other:    Medical Decision Making  Medically screening exam initiated at 2:12 PM.  Appropriate orders placed.  Rachel Black was informed that the remainder of the evaluation will be completed by another provider, this initial triage assessment does not replace that evaluation, and the importance of remaining in the ED until their evaluation is complete.     Tacy Learn, PA-C 04/04/22 1414

## 2022-04-06 ENCOUNTER — Telehealth: Payer: Self-pay | Admitting: Cardiology

## 2022-04-06 NOTE — Telephone Encounter (Signed)
Called pt to let her know her results have not been reviewed by a provider yet and Dr. Percival Spanish is out of the office. Informed pt when it is reviewed we will contact her. No further questions at this time.

## 2022-04-06 NOTE — Telephone Encounter (Signed)
Pt is requesting call back in regards to results of heart monitor.

## 2022-04-07 ENCOUNTER — Other Ambulatory Visit: Payer: Self-pay

## 2022-04-07 ENCOUNTER — Emergency Department (HOSPITAL_COMMUNITY)
Admission: EM | Admit: 2022-04-07 | Discharge: 2022-04-07 | Disposition: A | Discharge status: Home / Self Care | Payer: BC Managed Care – PPO | Attending: Emergency Medicine | Admitting: Emergency Medicine

## 2022-04-07 ENCOUNTER — Encounter (HOSPITAL_COMMUNITY): Payer: Self-pay

## 2022-04-07 ENCOUNTER — Emergency Department (HOSPITAL_COMMUNITY): Payer: BC Managed Care – PPO

## 2022-04-07 ENCOUNTER — Ambulatory Visit (INDEPENDENT_AMBULATORY_CARE_PROVIDER_SITE_OTHER)
Admission: EM | Admit: 2022-04-07 | Discharge: 2022-04-07 | Disposition: A | Discharge status: Home / Self Care | Payer: BC Managed Care – PPO | Source: Home / Self Care

## 2022-04-07 ENCOUNTER — Encounter: Payer: Self-pay | Admitting: Emergency Medicine

## 2022-04-07 DIAGNOSIS — R1013 Epigastric pain: Secondary | ICD-10-CM | POA: Insufficient documentation

## 2022-04-07 DIAGNOSIS — I2510 Atherosclerotic heart disease of native coronary artery without angina pectoris: Secondary | ICD-10-CM | POA: Diagnosis not present

## 2022-04-07 DIAGNOSIS — R079 Chest pain, unspecified: Secondary | ICD-10-CM | POA: Insufficient documentation

## 2022-04-07 DIAGNOSIS — Z7982 Long term (current) use of aspirin: Secondary | ICD-10-CM | POA: Diagnosis not present

## 2022-04-07 DIAGNOSIS — Z79899 Other long term (current) drug therapy: Secondary | ICD-10-CM | POA: Insufficient documentation

## 2022-04-07 DIAGNOSIS — I10 Essential (primary) hypertension: Secondary | ICD-10-CM | POA: Diagnosis not present

## 2022-04-07 DIAGNOSIS — E119 Type 2 diabetes mellitus without complications: Secondary | ICD-10-CM | POA: Insufficient documentation

## 2022-04-07 DIAGNOSIS — R0789 Other chest pain: Secondary | ICD-10-CM

## 2022-04-07 LAB — COMPREHENSIVE METABOLIC PANEL
ALT: 15 U/L (ref 0–44)
AST: 21 U/L (ref 15–41)
Albumin: 3.8 g/dL (ref 3.5–5.0)
Alkaline Phosphatase: 54 U/L (ref 38–126)
Anion gap: 9 (ref 5–15)
BUN: 17 mg/dL (ref 8–23)
CO2: 23 mmol/L (ref 22–32)
Calcium: 9.9 mg/dL (ref 8.9–10.3)
Chloride: 105 mmol/L (ref 98–111)
Creatinine, Ser: 1.34 mg/dL — ABNORMAL HIGH (ref 0.44–1.00)
GFR, Estimated: 43 mL/min — ABNORMAL LOW (ref >60)
Glucose, Bld: 110 mg/dL — ABNORMAL HIGH (ref 70–99)
Potassium: 4.3 mmol/L (ref 3.5–5.1)
Sodium: 137 mmol/L (ref 135–145)
Total Bilirubin: 0.3 mg/dL (ref 0.3–1.2)
Total Protein: 6.5 g/dL (ref 6.5–8.1)

## 2022-04-07 LAB — CBC WITH DIFFERENTIAL/PLATELET
Abs Immature Granulocytes: 0.07 10*3/uL (ref 0.00–0.07)
Basophils Absolute: 0 10*3/uL (ref 0.0–0.1)
Basophils Relative: 0 %
Eosinophils Absolute: 0 10*3/uL (ref 0.0–0.5)
Eosinophils Relative: 0 %
HCT: 34.3 % — ABNORMAL LOW (ref 36.0–46.0)
Hemoglobin: 11.2 g/dL — ABNORMAL LOW (ref 12.0–15.0)
Immature Granulocytes: 1 %
Lymphocytes Relative: 33 %
Lymphs Abs: 2.5 10*3/uL (ref 0.7–4.0)
MCH: 30.8 pg (ref 26.0–34.0)
MCHC: 32.7 g/dL (ref 30.0–36.0)
MCV: 94.2 fL (ref 80.0–100.0)
Monocytes Absolute: 0.3 10*3/uL (ref 0.1–1.0)
Monocytes Relative: 4 %
Neutro Abs: 4.8 10*3/uL (ref 1.7–7.7)
Neutrophils Relative %: 62 %
Platelets: 306 10*3/uL (ref 150–400)
RBC: 3.64 MIL/uL — ABNORMAL LOW (ref 3.87–5.11)
RDW: 13.2 % (ref 11.5–15.5)
WBC: 7.7 10*3/uL (ref 4.0–10.5)
nRBC: 0 % (ref 0.0–0.2)

## 2022-04-07 LAB — TROPONIN I (HIGH SENSITIVITY)
Troponin I (High Sensitivity): 4 ng/L (ref <18)
Troponin I (High Sensitivity): 4 ng/L (ref <18)

## 2022-04-07 LAB — LIPASE, BLOOD: Lipase: 44 U/L (ref 11–51)

## 2022-04-07 MED ORDER — LORAZEPAM 1 MG PO TABS: 1.0000 mg | ORAL_TABLET | Freq: Every day | ORAL | 0 refills | Status: DC | PRN

## 2022-04-07 NOTE — ED Triage Notes (Signed)
Pt is present today with abdominal pain and palpitation. Pt sx started 6 months ago but recently became worse x3 days ago

## 2022-04-07 NOTE — ED Provider Notes (Signed)
[facility] EMERGENCY DEPARTMENT Provider Note   CSN: [id number] Arrival date & time: 04/07/22  1224     History  Chief Complaint  Patient presents with   Chest Pain   Abdominal Pain    Rachel Black is a 71 y.o. female.  Pt is a 71 yo female with a pmhx significant for hld, htn, gerd, dm, cva, cad, and pfo.  She presents to the ED today with cp and palpitations. Pt has been seen several times recently for cp.  She has had negative work ups.  She did f/u with cards and wore a Zio patch.  She sent it in, but does not yet have results.  She had cp again today.  She went to [facility] and they told her that she did not need to come to the ED, but she felt bad, so came to the ED.       Home Medications Prior to Admission medications   Medication Sig Start Date End Date Taking? Authorizing Provider  LORazepam (ATIVAN) 1 MG tablet Take 1 tablet (1 mg total) by mouth daily as needed for anxiety. 04/07/22  Yes Isla Pence, MD  ACCU-CHEK AVIVA PLUS test strip USE AS INSTRUCTED ONCE A DAY. DX CODE E11.9 02/10/22   Lorrene Reid, PA-C  Accu-Chek Softclix Lancets lancets Use as instructed once a day.  DX Code: E11.9 03/24/22   Lorrene Reid, PA-C  allopurinol (ZYLOPRIM) 300 MG tablet Take 1 tablet (300 mg total) by mouth 2 (two) times daily. 08/26/21   Lorrene Reid, PA-C  ALPRAZolam (XANAX) 0.25 MG tablet Take 1 tablet (0.25 mg total) by mouth 2 (two) times daily as needed for anxiety. Patient not taking: Reported on 04/04/2022 03/19/22   Alfredia Ferguson, PA-C  aspirin 81 MG EC tablet Take 1 tablet (81 mg total) by mouth daily. 10/18/17   Cheryln Manly, NP  Calcium Carb-Cholecalciferol (CALCIUM 600-D PO) Take 1 tablet by mouth 2 (two) times daily before a meal.    [provider]  dexlansoprazole (DEXILANT) 60 MG capsule Take 1 capsule (60 mg total) by mouth daily. Patient not taking: Reported on 04/04/2022 03/19/22   Esterwood, [name] S, PA-C  dicyclomine (BENTYL) 20  MG tablet Take 1 tablet (20 mg total) by mouth 2 (two) times daily as needed (abdominal cramping). Patient not taking: Reported on 04/04/2022 03/23/22   Mesner, Corene Cornea, MD  docusate sodium (COLACE) 100 MG capsule Take 1 capsule (100 mg total) by mouth 2 (two) times daily as needed for mild constipation. Patient not taking: Reported on 04/04/2022 03/16/22   Roemhildt, Lorin T, PA-C  Emollient (UDDERLY SMOOTH) CREA Apply 1 application topically See admin instructions. Apply topically to skin folds as needed for rash/irritation    [provider]  Famotidine (PEPCID PO) Take 1 tablet by mouth daily as needed (heartburn).    [provider]  fenofibrate 160 MG tablet TAKE 1 TABLET BY MOUTH EVERY DAY Patient taking differently: Take 160 mg by mouth daily. 02/16/22   Lorrene Reid, PA-C  ferrous sulfate 325 (65 FE) MG tablet TAKE 1 TABLET BY MOUTH TWICE A DAY Patient taking differently: Take 325 mg by mouth 2 (two) times daily with a meal. 02/02/22   Zehr, Laban Emperor, PA-C  isosorbide mononitrate (IMDUR) 60 MG 24 hr tablet Take 1 tablet (60 mg total) by mouth daily. 03/09/22   Minus Breeding, MD  olmesartan (BENICAR) 5 MG tablet TAKE 1 TABLET (5 MG TOTAL) BY MOUTH DAILY. 04/03/22  Abonza, Maritza, PA-C  ondansetron (ZOFRAN) 4 MG tablet Take 1 tablet (4 mg total) by mouth every 8 (eight) hours as needed for nausea or vomiting. Patient not taking: Reported on 04/04/2022 02/27/22   Tegeler, Gwenyth Allegra, MD  polyethylene glycol (MIRALAX / GLYCOLAX) 17 g packet Take 17 g by mouth daily. 03/16/22   Roemhildt, Lorin T, PA-C  rosuvastatin (CRESTOR) 40 MG tablet Take 1 tablet (40 mg total) by mouth daily. 03/24/22   Minus Breeding, MD  sucralfate (CARAFATE) 1 g tablet Take 1 tablet (1 g total) by mouth 4 (four) times daily -  with meals and at bedtime for 14 days. 03/16/22 03/30/22  Roemhildt, Lorin T, PA-C  sucralfate (CARAFATE) 1 g tablet Take 1 g by mouth daily.    [provider]  Vitamin D,  Ergocalciferol, (DRISDOL) 1.25 MG (50000 UNIT) CAPS capsule TAKE 1 CAPSULE BY MOUTH ONE TIME PER WEEK Patient taking differently: Take 50,000 Units by mouth every 7 (seven) days. Takes on Monday's 02/02/22   Lorrene Reid, PA-C      Allergies    Other and Penicillins    Review of Systems   Review of Systems  Cardiovascular:  Positive for chest pain and palpitations.  All other systems reviewed and are negative.   Physical Exam Updated Vital Signs BP 105/82   Pulse 70   Temp 98.6 F (37 C) (Oral)   Resp 16   Ht 5' 6"  (1.676 m)   Wt 80.7 kg   SpO2 100%   BMI 28.73 kg/m  Physical Exam Vitals and nursing note reviewed.  Constitutional:      Appearance: She is well-developed.  HENT:     Head: Normocephalic and atraumatic.  Eyes:     Extraocular Movements: Extraocular movements intact.     Pupils: Pupils are equal, round, and reactive to light.  Cardiovascular:     Rate and Rhythm: Normal rate and regular rhythm.     Heart sounds: Normal heart sounds.  Pulmonary:     Effort: Pulmonary effort is normal.     Breath sounds: Normal breath sounds.  Abdominal:     General: Bowel sounds are normal.     Palpations: Abdomen is soft.  Musculoskeletal:        General: Normal range of motion.     Cervical back: Normal range of motion and neck supple.  Skin:    General: Skin is warm and dry.     Capillary Refill: Capillary refill takes less than 2 seconds.  Neurological:     General: No focal deficit present.     Mental Status: She is alert and oriented to person, place, and time.  Psychiatric:        Mood and Affect: Mood normal.        Behavior: Behavior normal.     ED Results / Procedures / Treatments   Labs (all labs ordered are listed, but only abnormal results are displayed) Labs Reviewed  COMPREHENSIVE METABOLIC PANEL - Abnormal; Notable for the following components:      Result Value   Glucose, Bld 110 (*)    Creatinine, Ser 1.34 (*)    GFR, Estimated 43 (*)     All other components within normal limits  CBC WITH DIFFERENTIAL/PLATELET - Abnormal; Notable for the following components:   RBC 3.64 (*)    Hemoglobin 11.2 (*)    HCT 34.3 (*)    All other components within normal limits  LIPASE, BLOOD  URINALYSIS, ROUTINE W REFLEX  MICROSCOPIC  TROPONIN I (HIGH SENSITIVITY)  TROPONIN I (HIGH SENSITIVITY)    EKG EKG Interpretation  Date/Time:  Tuesday April 07 2022 12:41:17 EDT Ventricular Rate:  98 PR Interval:  156 QRS Duration: 90 QT Interval:  354 QTC Calculation: 451 R Axis:   -69 Text Interpretation: Normal sinus rhythm Left anterior fascicular block Possible Anterolateral infarct , age undetermined Abnormal ECG When compared with ECG of 07-Apr-2022 11:48, PREVIOUS ECG IS PRESENT No significant change since last tracing Confirmed by Isla Pence [phone]) on 04/07/2022 6:24:23 PM  Radiology DG Chest 2 View  Result Date: 04/07/2022 CLINICAL DATA:  Chest pain EXAM: CHEST - 2 VIEW COMPARISON:  Previous studies including the chest radiograph done on 04/04/2022 FINDINGS: Cardiac size is within normal limits. Lung fields are clear of any infiltrates or pulmonary edema. There is no pleural effusion or pneumothorax. IMPRESSION: No active cardiopulmonary disease. Electronically Signed   By: Elmer Picker M.D.   On: 04/07/2022 13:51    Procedures Procedures    Medications Ordered in ED Medications - No data to display  ED Course/ Medical Decision Making/ A&P                           Medical Decision Making Risk Prescription drug management.   This patient presents to the ED for concern of cp, this involves an extensive number of treatment options, and is a complaint that carries with it a high risk of complications and morbidity.  The differential diagnosis includes cardiac, pulm, gi, anxiety   Co morbidities that complicate the patient evaluation  hld, htn, gerd, dm, cva, cad, and pfo   Additional history  obtained:  Additional history obtained from epic chart review External records from outside source obtained and reviewed including husband   Lab Tests:  I Ordered, and personally interpreted labs.  The pertinent results include:  cbc with hgb low at 11.2 (chronic); cmp with Cr 1.34 (chronic); 2 negative troponins   Imaging Studies ordered:  I ordered imaging studies including CXR  I independently visualized and interpreted imaging which showed  IMPRESSION:  No active cardiopulmonary disease.   I agree with the radiologist interpretation   Cardiac Monitoring:  The patient was maintained on a cardiac monitor.  I personally viewed and interpreted the cardiac monitored which showed an underlying rhythm of: nsr   Medicines ordered and prescription drug management:  I have reviewed the patients home medicines and have made adjustments as needed   Test Considered:  Cta chest, but this was done on 7/3   Critical Interventions:  Trop/ekg   Problem List / ED Course:  Chest pain:  this does not appear to be cardiac in nature.  Pt has had multiple recent visits with normal troponins. She does admit to feeling very anxious.  I will give her a rx for ativan to see if that helps.  She is to f/u with her pcp and to return if worse.   Reevaluation:  After the interventions noted above, I reevaluated the patient and found that they have :improved   Social Determinants of Health:  Lives at home   Dispostion:  After consideration of the diagnostic results and the patients response to treatment, I feel that the patent would benefit from discharge with outpatient f/u.          Final Clinical Impression(s) / ED Diagnoses Final diagnoses:  Atypical chest pain    Rx / DC Orders ED Discharge Orders  Ordered    LORazepam (ATIVAN) 1 MG tablet  Daily PRN        04/07/22 Kai Levins, MD 04/07/22 1856

## 2022-04-07 NOTE — ED Provider Notes (Signed)
[facility]    CSN: [id number] Arrival date & time: 04/07/22  1052      History   Chief Complaint Chief Complaint  Patient presents with   Abdominal Pain   Palpitations    HPI Rachel Black is a 71 y.o. female.   Patient presents with chest pain, heart palpitations, abdominal pain.  Symptoms have been present intermittently over the past 6 months.  She has been evaluated multiple times at the ER for same symptoms.  She has had multiple scans, blood work, troponins, EKGs that were all unremarkable.  She has followed up with cardiology and gastroenterology.  She wore a ZIO monitor that was removed a few days prior.  She is currently awaiting results.  She states that she called cardiology today but they are not able to provide her with the results as her cardiologist was not present in office today.  She was started on metoprolol at cardiology office visit but husband states that they stopped it at most recent ER visit.  She has endoscopy scheduled in about 3 days with gastroenterology.  Patient states that "she just feels really bad" and symptoms appear to be worsening.  Chest pain is present in the center of the chest and states it feels like "her heart is pounding out of her chest".  Pain is present in the epigastric area.  She denies any associated nausea, vomiting, diarrhea, blood in stool.  She is having normal bowel movements.  Denies any associated fever.  Patient was prescribed Bentyl at previous ER visit but reports that she has not taken this medication.  She was seen on 04/04/2022 at ER visit with no obvious abnormal findings.  She was advised to follow-up with cardiology and gastroenterology at that time as well.  Husband is suspicious that symptoms could be related to anxiety.  He states that she will go to work and he will get a phone call that she is having chest pain and palpitations, and then they have to leave to go to the ER.  Patient denies that she has any  anxiety related to her workplace and denies any recent stressors.   Abdominal Pain Palpitations   Past Medical History:  Diagnosis Date   Arthritis    PAIN AND OA BOTH KNEES AND SHOULDERS AND ELBOWS AND WRIST   Blood transfusion without reported diagnosis 2018   after cholecystectomy   Broken foot, right, closed, initial encounter 09/15/2021   no surgery needed, fell at home and went to ED   Diabetes mellitus    ORAL MEDICATION   GERD (gastroesophageal reflux disease)    Gout    NO RECENT FLARE UPS   H/O: rheumatic fever    AS A CHILD - NO KNOWN HEART MURMUR OR HEART PROBLEMS   Heart attack ([place])    Hyperlipidemia    Hypertension    PFO (patent foramen ovale)    Stroke ([place]) 03/2017    Patient Active Problem List   Diagnosis Date Noted   Hypertension associated with diabetes ([place]) 03/23/2022   Epigastric pain 03/16/2022   Constipation 03/16/2022   Influenza A 09/16/2021   Acute cystitis without hematuria 09/16/2021   Closed nondisplaced fracture of metatarsal bone of right foot, initial encounter 68/07/5725   Acute metabolic encephalopathy 20/35/5974   Cryptogenic stroke ([place]) 01/13/2021   GERD without esophagitis 11/26/2020   Iron deficiency 11/26/2020   Generalized abdominal pain 11/26/2020   Pneumonia due to COVID-19 virus    Mixed hyperlipidemia due  to type 2 diabetes mellitus ([place]) 11/27/2019   Family history of breast cancer in mother 11/27/2019   Cataract of both eyes 11/27/2019   Coronary artery disease involving native coronary artery of native heart without angina pectoris 02/18/2018   NSTEMI (non-ST elevated myocardial infarction) [facility])    Chest pain of uncertain etiology 72/05/4708   Need for shingles vaccine 09/21/2017   Need for pneumococcal vaccine 09/21/2017   History of rheumatic fever 06/30/2017   PFO (patent foramen ovale)    Cerebrovascular accident (CVA) due to embolism of right middle cerebral artery ([place]) 03/14/2017   Dilation of biliary  tract    Elevated LFTs    RUQ abdominal pain    Acute gallstone pancreatitis 12/07/2016   Choledocholithiasis with obstruction 12/07/2016   AKI (acute kidney injury) ([place]) 12/07/2016   Osteoarthritis of right knee 08/23/2015   Status post total left knee replacement 08/23/2015   Arthritis of knee, right 04/20/2014   Status post total right knee replacement 04/20/2014   Flushing reaction 03/10/2012   Diabetes mellitus ([place]) 02/17/2007   Mixed hyperlipidemia 02/17/2007   GOUT 02/17/2007   Severe obesity (BMI >= 40) ([place]) 02/17/2007   Essential hypertension 02/17/2007   RHEUMATIC FEVER, HX OF 02/17/2007    Past Surgical History:  Procedure Laterality Date   ABDOMINAL HYSTERECTOMY     CESAREAN SECTION     CHOLECYSTECTOMY N/A 12/09/2016   Procedure: LAPAROSCOPIC CHOLECYSTECTOMY WITH INTRAOPERATIVE CHOLANGIOGRAM;  Surgeon: Stark Klein, MD;  Location: [facility];  Service: General;  Laterality: N/A;   COLONOSCOPY     COLONOSCOPY WITH PROPOFOL N/A 01/30/2013   Procedure: COLONOSCOPY WITH PROPOFOL;  Surgeon: Irene Shipper, MD;  Location: [facility];  Service: Endoscopy;  Laterality: N/A;   ERCP N/A 12/08/2016   Procedure: ENDOSCOPIC RETROGRADE CHOLANGIOPANCREATOGRAPHY (ERCP);  Surgeon: Ladene Artist, MD;  Location: [facility];  Service: Endoscopy;  Laterality: N/A;   ESOPHAGOGASTRODUODENOSCOPY (EGD) WITH PROPOFOL N/A 01/30/2013   Procedure: ESOPHAGOGASTRODUODENOSCOPY (EGD) WITH PROPOFOL;  Surgeon: Irene Shipper, MD;  Location: [facility];  Service: Endoscopy;  Laterality: N/A;   LEFT HEART CATH AND CORONARY ANGIOGRAPHY N/A 10/18/2017   Procedure: LEFT HEART CATH AND CORONARY ANGIOGRAPHY;  Surgeon: Martinique, Peter M, MD;  Location: [facility];  Service: Cardiovascular;  Laterality: N/A;   LOOP RECORDER INSERTION N/A 03/18/2017   Procedure: Loop Recorder Insertion;  Surgeon: Constance Haw, MD;  Location: [facility];  Service: Cardiovascular;  Laterality: N/A;    PARTIAL HYSTERECTOMY     TEE WITHOUT CARDIOVERSION N/A 03/16/2017   Procedure: TRANSESOPHAGEAL ECHOCARDIOGRAM (TEE);  Surgeon: Acie Fredrickson Wonda Cheng, MD;  [facility];  Service: Cardiovascular;  Laterality: N/A;   TOTAL KNEE ARTHROPLASTY Right 04/20/2014   Procedure: RIGHT TOTAL KNEE ARTHROPLASTY CONVERTED TO RIGHT KNEE REIMPLANTATION;  Surgeon: Mcarthur Rossetti, MD;  Location: [facility];  Service: Orthopedics;  Laterality: Right;   TOTAL KNEE ARTHROPLASTY Left 08/23/2015   Procedure: LEFT TOTAL KNEE ARTHROPLASTY;  Surgeon: Mcarthur Rossetti, MD;  Location: [facility];  Service: Orthopedics;  Laterality: Left;   VENTRAL HERNIA REPAIR     x2    OB History   No obstetric history on file.      Home Medications    Prior to Admission medications   Medication Sig Start Date End Date Taking? Authorizing Provider  ACCU-CHEK AVIVA PLUS test strip USE AS INSTRUCTED ONCE A DAY. DX CODE E11.9 02/10/22   Lorrene Reid, PA-C  Accu-Chek Softclix Lancets lancets Use as instructed  once a day.  DX Code: E11.9 03/24/22   Lorrene Reid, PA-C  allopurinol (ZYLOPRIM) 300 MG tablet Take 1 tablet (300 mg total) by mouth 2 (two) times daily. 08/26/21   Lorrene Reid, PA-C  ALPRAZolam (XANAX) 0.25 MG tablet Take 1 tablet (0.25 mg total) by mouth 2 (two) times daily as needed for anxiety. Patient not taking: Reported on 04/04/2022 03/19/22   Alfredia Ferguson, PA-C  aspirin 81 MG EC tablet Take 1 tablet (81 mg total) by mouth daily. 10/18/17   Cheryln Manly, NP  Calcium Carb-Cholecalciferol (CALCIUM 600-D PO) Take 1 tablet by mouth 2 (two) times daily before a meal.    [provider]  dexlansoprazole (DEXILANT) 60 MG capsule Take 1 capsule (60 mg total) by mouth daily. Patient not taking: Reported on 04/04/2022 03/19/22   Esterwood, [name] S, PA-C  dicyclomine (BENTYL) 20 MG tablet Take 1 tablet (20 mg total) by mouth 2 (two) times daily as needed (abdominal cramping). Patient not taking:  Reported on 04/04/2022 03/23/22   Mesner, Corene Cornea, MD  docusate sodium (COLACE) 100 MG capsule Take 1 capsule (100 mg total) by mouth 2 (two) times daily as needed for mild constipation. Patient not taking: Reported on 04/04/2022 03/16/22   Roemhildt, Lorin T, PA-C  Emollient (UDDERLY SMOOTH) CREA Apply 1 application topically See admin instructions. Apply topically to skin folds as needed for rash/irritation    [provider]  Famotidine (PEPCID PO) Take 1 tablet by mouth daily as needed (heartburn).    [provider]  fenofibrate 160 MG tablet TAKE 1 TABLET BY MOUTH EVERY DAY Patient taking differently: Take 160 mg by mouth daily. 02/16/22   Lorrene Reid, PA-C  ferrous sulfate 325 (65 FE) MG tablet TAKE 1 TABLET BY MOUTH TWICE A DAY Patient taking differently: Take 325 mg by mouth 2 (two) times daily with a meal. 02/02/22   Zehr, Laban Emperor, PA-C  isosorbide mononitrate (IMDUR) 60 MG 24 hr tablet Take 1 tablet (60 mg total) by mouth daily. 03/09/22   Minus Breeding, MD  olmesartan (BENICAR) 5 MG tablet TAKE 1 TABLET (5 MG TOTAL) BY MOUTH DAILY. 04/03/22   Abonza, Maritza, PA-C  ondansetron (ZOFRAN) 4 MG tablet Take 1 tablet (4 mg total) by mouth every 8 (eight) hours as needed for nausea or vomiting. Patient not taking: Reported on 04/04/2022 02/27/22   Tegeler, Gwenyth Allegra, MD  polyethylene glycol (MIRALAX / GLYCOLAX) 17 g packet Take 17 g by mouth daily. 03/16/22   Roemhildt, Lorin T, PA-C  rosuvastatin (CRESTOR) 40 MG tablet Take 1 tablet (40 mg total) by mouth daily. 03/24/22   Minus Breeding, MD  sucralfate (CARAFATE) 1 g tablet Take 1 tablet (1 g total) by mouth 4 (four) times daily -  with meals and at bedtime for 14 days. 03/16/22 03/30/22  Roemhildt, Lorin T, PA-C  sucralfate (CARAFATE) 1 g tablet Take 1 g by mouth daily.    [provider]  Vitamin D, Ergocalciferol, (DRISDOL) 1.25 MG (50000 UNIT) CAPS capsule TAKE 1 CAPSULE BY MOUTH ONE TIME PER WEEK Patient taking  differently: Take 50,000 Units by mouth every 7 (seven) days. Takes on Monday's 02/02/22   Lorrene Reid, PA-C    Family History Family History  Problem Relation Age of Onset   Diabetes Mother    Hypertension Mother        entire family   Breast cancer Mother        diagnosed in her 33's   Stroke Father  CVA   Hyperlipidemia Father        entire family   Diabetes Father    Heart disease Father        No details.  Not at an early age.     Crohn's disease Son    Irritable bowel syndrome Son    Colon cancer Neg Hx    Colon polyps Neg Hx    Esophageal cancer Neg Hx    Stomach cancer Neg Hx    Rectal cancer Neg Hx     Social History Social History   Tobacco Use   Smoking status: Never   Smokeless tobacco: Never  Vaping Use   Vaping Use: Never used  Substance Use Topics   Alcohol use: Not Currently    Comment: rarely in the past   Drug use: Never     Allergies   Other and Penicillins   Review of Systems Review of Systems Per HPI  Physical Exam Triage Vital Signs ED Triage Vitals  Enc Vitals Group     BP 04/07/22 1115 119/78     Pulse Rate 04/07/22 1115 89     Resp 04/07/22 1115 16     Temp 04/07/22 1116 98.3 F (36.8 C)     Temp src --      SpO2 04/07/22 1115 96 %     Weight --      Height --      Head Circumference --      Peak Flow --      Pain Score 04/07/22 1114 5     Pain Loc --      Pain Edu? --      Excl. in [place]? --    No data found.  Updated Vital Signs BP 119/78   Pulse 89   Temp 98.3 F (36.8 C)   Resp 16   SpO2 96%   Visual Acuity Right Eye Distance:   Left Eye Distance:   Bilateral Distance:    Right Eye Near:   Left Eye Near:    Bilateral Near:     Physical Exam Constitutional:      General: She is not in acute distress.    Appearance: Normal appearance. She is not toxic-appearing or diaphoretic.  HENT:     Head: Normocephalic and atraumatic.     Mouth/Throat:     Mouth: Mucous membranes are moist.      Pharynx: No posterior oropharyngeal erythema.  Eyes:     Extraocular Movements: Extraocular movements intact.     Conjunctiva/sclera: Conjunctivae normal.     Pupils: Pupils are equal, round, and reactive to light.  Cardiovascular:     Rate and Rhythm: Normal rate and regular rhythm.     Pulses: Normal pulses.     Heart sounds: Normal heart sounds.  Pulmonary:     Effort: Pulmonary effort is normal. No respiratory distress.     Breath sounds: Normal breath sounds.  Abdominal:     General: Bowel sounds are normal. There is no distension.     Palpations: Abdomen is soft.     Tenderness: There is abdominal tenderness in the epigastric area.  Neurological:     General: No focal deficit present.     Mental Status: She is alert and oriented to person, place, and time. Mental status is at baseline.  Psychiatric:        Mood and Affect: Mood normal.        Behavior: Behavior normal.  Thought Content: Thought content normal.        Judgment: Judgment normal.      UC Treatments / Results  Labs (all labs ordered are listed, but only abnormal results are displayed) Labs Reviewed - No data to display  EKG   Radiology No results found.  Procedures Procedures (including critical care time)  Medications Ordered in UC Medications - No data to display  Initial Impression / Assessment and Plan / UC Course  I have reviewed the triage vital signs and the nursing notes.  Pertinent labs & imaging results that were available during my care of the patient were reviewed by me and considered in my medical decision making (see chart for details).     EKG was unchanged from previous EKG.  Limited resources here in urgent care for evaluation but do not think that any additional work-up is necessary given that patient has had full work-up for same symptoms multiple times in ER.  Advised patient that best next step is to follow-up with GI and cardiology for further evaluation and  management.  Patient continued to repeat that "she just feels really bad".  She stated at the end of the visit that she would like to go to the ER for further evaluation.  Advised patient that this is not recommended given that I do not feel that any additional work-up is necessary given previous work-ups have been unremarkable.  Patient continued to state that she may still go to the ER either way.  Patient advised again of recommendations for GI and cardiology follow-up and was given strict ER precautions.  I did discussed deep breathing techniques in case anxiety is related.  Patient verbalized understanding. Final Clinical Impressions(s) / UC Diagnoses   Final diagnoses:  Chest pain, unspecified type  Abdominal pain, epigastric     Discharge Instructions      Your EKG did not show any changes.  Recommend that you have close follow-up with cardiology and gastroenterology.  You may choose to go to the ER if symptoms persist or worsen.    ED Prescriptions   None    PDMP not reviewed this encounter.   Teodora Medici, [place] 04/07/22 [id number]

## 2022-04-07 NOTE — Discharge Instructions (Signed)
Your EKG did not show any changes.  Recommend that you have close follow-up with cardiology and gastroenterology.  You may choose to go to the ER if symptoms persist or worsen.

## 2022-04-07 NOTE — ED Triage Notes (Signed)
Pt arrived POV from home c/o centralized Cp and generalized abdominal pain x one month pt states she has been seen and they told her nothing is wrong. Pt states it started hurting really bad again this morning.

## 2022-04-07 NOTE — ED Provider Triage Note (Signed)
Emergency Medicine Provider Triage Evaluation Note  BAYLYNN SHIFFLETT , a 71 y.o. female  was evaluated in triage.  Pt complains of chest and abdominal pain.  She has been seen multiple times for this same recently.  She states her pain is usual feeling in location but more severe.    Physical Exam  BP 103/67 (BP Location: Left Arm)   Pulse 93   Temp 99.1 F (37.3 C) (Oral)   Resp 18   Ht 5' 6"  (1.676 m)   Wt 80.7 kg   SpO2 93%   BMI 28.73 kg/m  Gen:   Awake, no distress   Resp:  Normal effort  MSK:   Moves extremities without difficulty  Other:  RRR   Medical Decision Making  Medically screening exam initiated at 1:24 PM.  Appropriate orders placed.  [name] was informed that the remainder of the evaluation will be completed by another provider, this initial triage assessment does not replace that evaluation, and the importance of remaining in the ED until their evaluation is complete.     Lorin Glass, [place] 04/07/22 1325

## 2022-04-09 ENCOUNTER — Other Ambulatory Visit: Payer: Self-pay

## 2022-04-09 ENCOUNTER — Emergency Department (HOSPITAL_COMMUNITY)
Admission: EM | Admit: 2022-04-09 | Discharge: 2022-04-10 | Discharge status: Against Medical Advice | Payer: BC Managed Care – PPO | Attending: Emergency Medicine | Admitting: Emergency Medicine

## 2022-04-09 ENCOUNTER — Encounter (HOSPITAL_COMMUNITY): Payer: Self-pay

## 2022-04-09 DIAGNOSIS — R002 Palpitations: Secondary | ICD-10-CM | POA: Insufficient documentation

## 2022-04-09 DIAGNOSIS — R079 Chest pain, unspecified: Secondary | ICD-10-CM | POA: Insufficient documentation

## 2022-04-09 DIAGNOSIS — Z5321 Procedure and treatment not carried out due to patient leaving prior to being seen by health care provider: Secondary | ICD-10-CM | POA: Diagnosis not present

## 2022-04-09 DIAGNOSIS — R110 Nausea: Secondary | ICD-10-CM | POA: Diagnosis not present

## 2022-04-09 DIAGNOSIS — R109 Unspecified abdominal pain: Secondary | ICD-10-CM | POA: Diagnosis present

## 2022-04-09 LAB — COMPREHENSIVE METABOLIC PANEL
ALT: 16 U/L (ref 0–44)
AST: 20 U/L (ref 15–41)
Albumin: 3.7 g/dL (ref 3.5–5.0)
Alkaline Phosphatase: 50 U/L (ref 38–126)
Anion gap: 8 (ref 5–15)
BUN: 29 mg/dL — ABNORMAL HIGH (ref 8–23)
CO2: 23 mmol/L (ref 22–32)
Calcium: 10.3 mg/dL (ref 8.9–10.3)
Chloride: 108 mmol/L (ref 98–111)
Creatinine, Ser: 1.3 mg/dL — ABNORMAL HIGH (ref 0.44–1.00)
GFR, Estimated: 45 mL/min — ABNORMAL LOW (ref >60)
Glucose, Bld: 109 mg/dL — ABNORMAL HIGH (ref 70–99)
Potassium: 4.6 mmol/L (ref 3.5–5.1)
Sodium: 139 mmol/L (ref 135–145)
Total Bilirubin: 0.4 mg/dL (ref 0.3–1.2)
Total Protein: 6.4 g/dL — ABNORMAL LOW (ref 6.5–8.1)

## 2022-04-09 LAB — CBC WITH DIFFERENTIAL/PLATELET
Abs Immature Granulocytes: 0.09 10*3/uL — ABNORMAL HIGH (ref 0.00–0.07)
Basophils Absolute: 0 10*3/uL (ref 0.0–0.1)
Basophils Relative: 0 %
Eosinophils Absolute: 0 10*3/uL (ref 0.0–0.5)
Eosinophils Relative: 0 %
HCT: 35.7 % — ABNORMAL LOW (ref 36.0–46.0)
Hemoglobin: 11.6 g/dL — ABNORMAL LOW (ref 12.0–15.0)
Immature Granulocytes: 1 %
Lymphocytes Relative: 30 %
Lymphs Abs: 2.8 10*3/uL (ref 0.7–4.0)
MCH: 30.8 pg (ref 26.0–34.0)
MCHC: 32.5 g/dL (ref 30.0–36.0)
MCV: 94.7 fL (ref 80.0–100.0)
Monocytes Absolute: 0.5 10*3/uL (ref 0.1–1.0)
Monocytes Relative: 5 %
Neutro Abs: 5.8 10*3/uL (ref 1.7–7.7)
Neutrophils Relative %: 64 %
Platelets: 298 10*3/uL (ref 150–400)
RBC: 3.77 MIL/uL — ABNORMAL LOW (ref 3.87–5.11)
RDW: 13.3 % (ref 11.5–15.5)
WBC: 9.1 10*3/uL (ref 4.0–10.5)
nRBC: 0 % (ref 0.0–0.2)

## 2022-04-09 LAB — LIPASE, BLOOD: Lipase: 63 U/L — ABNORMAL HIGH (ref 11–51)

## 2022-04-09 NOTE — ED Provider Triage Note (Signed)
Emergency Medicine Provider Triage Evaluation Note  Rachel Black , a 71 y.o. female  was evaluated in triage.  Pt complains of abdominal pain worsening for the past month, constant nausea early satiety weight loss, also has associated chest pain and palpitations which are resolved at this point.  Denies vomiting, diarrhea, constipation.    Review of Systems  Positive: Abdominal pain Negative: Fever  Physical Exam  BP 108/66 (BP Location: Left Arm)   Pulse (!) 102   Temp 99.1 F (37.3 C) (Oral)   Resp 18   Ht 5' 6"  (1.676 m)   Wt 80.7 kg   SpO2 98%   BMI 28.73 kg/m  Gen:   Awake, no distress   Resp:  Normal effort  MSK:   Moves extremities without difficulty  Other:  Ttp epigastrium  Medical Decision Making  Medically screening exam initiated at 5:30 PM.  Appropriate orders placed.  Rachel Black was informed that the remainder of the evaluation will be completed by another provider, this initial triage assessment does not replace that evaluation, and the importance of remaining in the ED until their evaluation is complete.  Work-up initiated   Rachel Mail, PA-C 04/09/22 1734

## 2022-04-09 NOTE — ED Triage Notes (Signed)
Patient c/o generalized abdominal pain x months. Patient states she also has intermittent chest pain and the last was yesterday. Patient is scheduled for an endoscopy tomorrow. Patient c/o nausea, but denies V/D.

## 2022-04-10 ENCOUNTER — Encounter: Payer: Self-pay | Admitting: Internal Medicine

## 2022-04-10 ENCOUNTER — Ambulatory Visit (AMBULATORY_SURGERY_CENTER): Payer: BC Managed Care – PPO | Admitting: Internal Medicine

## 2022-04-10 VITALS — BP 97/50 | HR 59 | Temp 96.2°F | Resp 18 | Ht 66.0 in | Wt 178.0 lb

## 2022-04-10 DIAGNOSIS — K219 Gastro-esophageal reflux disease without esophagitis: Secondary | ICD-10-CM | POA: Diagnosis not present

## 2022-04-10 DIAGNOSIS — R634 Abnormal weight loss: Secondary | ICD-10-CM | POA: Diagnosis present

## 2022-04-10 DIAGNOSIS — K317 Polyp of stomach and duodenum: Secondary | ICD-10-CM | POA: Diagnosis not present

## 2022-04-10 DIAGNOSIS — K449 Diaphragmatic hernia without obstruction or gangrene: Secondary | ICD-10-CM

## 2022-04-10 DIAGNOSIS — R1013 Epigastric pain: Secondary | ICD-10-CM

## 2022-04-10 MED ORDER — PANTOPRAZOLE SODIUM 40 MG PO TBEC: 40.0000 mg | DELAYED_RELEASE_TABLET | Freq: Every day | ORAL | 11 refills | Status: DC

## 2022-04-10 MED ORDER — SODIUM CHLORIDE 0.9 % IV SOLN: 500.0000 mL | Freq: Once | INTRAVENOUS | Status: DC

## 2022-04-10 NOTE — Progress Notes (Signed)
Pt's states no medical or surgical changes since previsit or office visit.  BP 86/61 HR 88 on arrival.  Rechecked BP 88/51. MD and CRNA made aware. IVF infusing.

## 2022-04-10 NOTE — Patient Instructions (Signed)
YOU HAD AN ENDOSCOPIC PROCEDURE TODAY AT [facility]:   Refer to the procedure report that was given to you for any specific questions about what was found during the examination.  If the procedure report does not answer your questions, please call your gastroenterologist to clarify.  If you requested that your care partner not be given the details of your procedure findings, then the procedure report has been included in a sealed envelope for you to review at your convenience later.  YOU SHOULD EXPECT: Some feelings of bloating in the abdomen. Passage of more gas than usual.  Walking can help get rid of the air that was put into your GI tract during the procedure and reduce the bloating. If you had a lower endoscopy (such as a colonoscopy or flexible sigmoidoscopy) you may notice spotting of blood in your stool or on the toilet paper. If you underwent a bowel prep for your procedure, you may not have a normal bowel movement for a few days.  Please Note:  You might notice some irritation and congestion in your nose or some drainage.  This is from the oxygen used during your procedure.  There is no need for concern and it should clear up in a day or so.  SYMPTOMS TO REPORT IMMEDIATELY:  Following lower endoscopy (colonoscopy or flexible sigmoidoscopy):  Excessive amounts of blood in the stool  Significant tenderness or worsening of abdominal pains  Swelling of the abdomen that is new, acute  Fever of 100F or higher  Following upper endoscopy (EGD)  Vomiting of blood or coffee ground material  New chest pain or pain under the shoulder blades  Painful or persistently difficult swallowing  New shortness of breath  Fever of 100F or higher  Black, tarry-looking stools  For urgent or emergent issues, a gastroenterologist can be reached at any hour by calling [phone]. Do not use MyChart messaging for urgent concerns.    DIET:  We do recommend a small meal at first, but  then you may proceed to your regular diet.  Drink plenty of fluids but you should avoid alcoholic beverages for 24 hours.  ACTIVITY:  You should plan to take it easy for the rest of today and you should NOT DRIVE or use heavy machinery until tomorrow (because of the sedation medicines used during the test).    FOLLOW UP: Our staff will call the number listed on your records the next business day following your procedure.  We will call around 7:15- 8:00 am to check on you and address any questions or concerns that you may have regarding the information given to you following your procedure. If we do not reach you, we will leave a message.  If you develop any symptoms (ie: fever, flu-like symptoms, shortness of breath, cough etc.) before then, please call [phone].  If you test positive for Covid 19 in the 2 weeks post procedure, please call and report this information to [place].    If any biopsies were taken you will be contacted by phone or by letter within the next 1-3 weeks.  Please call us at [phone] if you have not heard about the biopsies in 3 weeks.    SIGNATURES/CONFIDENTIALITY: You and/or your care partner have signed paperwork which will be entered into your electronic medical record.  These signatures attest to the fact that that the information above on your After Visit Summary has been reviewed and is understood.  Full responsibility of the confidentiality  of this discharge information lies with you and/or your care-partner.

## 2022-04-10 NOTE — Op Note (Signed)
[facility] Patient Name: Rachel Black Procedure Date: 04/10/2022 10:20 AM MRN: [id number] Endoscopist: Docia Chuck. Henrene Pastor , MD Age: 71 Referring MD:  Date of Birth: 05-15-1951 Gender: Female Account #: [id number] Procedure:                Upper GI endoscopy with biopsies Indications:              Abdominal pain, Weight loss Medicines:                Monitored Anesthesia Care Procedure:                Pre-Anesthesia Assessment:                           - Prior to the procedure, a History and Physical                            was performed, and patient medications and                            allergies were reviewed. The patient's tolerance of                            previous anesthesia was also reviewed. The risks                            and benefits of the procedure and the sedation                            options and risks were discussed with the patient.                            All questions were answered, and informed consent                            was obtained. Prior Anticoagulants: The patient has                            taken no previous anticoagulant or antiplatelet                            agents. ASA Grade Assessment: II - A patient with                            mild systemic disease. After reviewing the risks                            and benefits, the patient was deemed in                            satisfactory condition to undergo the procedure.                           After obtaining informed consent, the endoscope was  passed under direct vision. Throughout the                            procedure, the patient's blood pressure, pulse, and                            oxygen saturations were monitored continuously. The                            GIF [id number] was introduced through the                            mouth, and advanced to the second part of duodenum.                            The upper GI  endoscopy was accomplished without                            difficulty. The patient tolerated the procedure                            well. Scope In: Scope Out: Findings:                 The esophagus revealed a distal large caliber                            ringlike stricture.                           The stomach revealed multiple gastric polyps.                            Majority were benign fundic gland type. Proximal                            stomach had 2 polyps measuring about 1 cm.                            Inflammatory. Biopsies taken. Was a moderate hiatal                            hernia. The stomach was otherwise normal.                           The examined duodenum was normal.                           The cardia and gastric fundus were normal on                            retroflexion, save polyps and hiatal hernia. Complications:            No immediate complications. Estimated Blood Loss:     Estimated blood loss: none. Impression:  1. GERD with esophageal stricture                           2. Gastric polyps                           3. No GI cause for symptoms found. Suspect related                            to anxiety/depression.. Recommendation:           - Patient has a contact number available for                            emergencies. The signs and symptoms of potential                            delayed complications were discussed with the                            patient. Return to normal activities tomorrow.                            Written discharge instructions were provided to the                            patient.                           - Resume previous diet.                           - Continue present medications.                           - Await pathology results.                           - You were recently prescribed DEXILANT 60 mg                            daily. Please make sure you take this medication                             every day to control your reflux symptoms                           ?"Return to the care of your primary provider Docia Chuck. Henrene Pastor, MD 04/10/2022 11:16:30 AM This report has been signed electronically.

## 2022-04-10 NOTE — Progress Notes (Signed)
Pt in recovery with monitors in place, VSS. Report given to receiving RN. Bite guard was placed with pt awake to ensure comfort. No dental or soft tissue damage noted.

## 2022-04-10 NOTE — Progress Notes (Signed)
Subjective:      Subjective [] Expand by Default Patient ID: Rachel Black, female    DOB: 08-Feb-1951, 71 y.o.   MRN: [id number]   HPI Rachel "Zigmund Daniel" is a 71 year old white female, established with Dr. Henrene Pastor who comes in today directly after leaving the emergency room today where she was being evaluated for complaints of chest pain and palpitations.  She has complaints of what sounds like mild epigastric discomfort, and chronic but worsening problems with constipation.  Her husband relates that her appetite has been poor for some time and she has lost about 20 pounds over the past 6 months.  He says she "eats like a bird". She does have history of chronic GERD, and it appears that she is currently taking Dexilant twice a day in addition to Protonix once a day.  Her husband says she has been eating a lot of over-the-counter Pepcid as well for intermittent complaints of indigestion.  She says she is not hungry at all, has no complaints of dysphagia or odynophagia. She has had 5-6 recent ER visits since March 12, 2022-all of these with complaints of chest pain and palpitations.  She has undergone extensive evaluation, including CT of the abdomen and pelvis which was done on 03/03/2022 which showed changes of a fatty liver, status postcholecystectomy, no ductal dilation, scattered diverticulosis no inflammatory changes. CT angiography was done of the chest on 03/16/2022 and negative, she has chronic air in the biliary tree consistent with prior sphincterotomy. Several chest x-rays have been done which are negative, including today. Labs have been unrevealing.  Today she had repeat troponin which was negative, BUN 15/creatinine 1.4 WBC 8.2/hemoglobin 0.9/hematocrit of 36. With ER visit yesterday labs were also done including hepatic panel and lipase which were both negative as were troponins.   She had cardiac consultation during her ER visit on 03/16/2022.  Noted at that time that she has history of a  cryptogenic stroke in 2018, unrevealing echo and TEE aside from PFO. She has an ILR implanted and no longer functioning but still in place.  Cardiac work-up 2019 for chest pain and NSTEMI.  Cath showed 100% occlusion of sidebranch OM 2 otherwise normal coronary anatomy, normal LVEDP.  Echo at that time with a EF of 55 to 60%. Patient does not give very good history but has stated she just does not feel well, complains of a sensation of palpitations and chest discomfort, by notes apparently more prominent in the morning.  No reliable exertional symptoms.  With 1 of these earlier ER visits a Zio patch was ordered and she is currently wearing this for 2 weeks. Telemetry monitoring showed rare PVC EKG with normal sinus rhythm, sinus bradycardia 44-60 at that time, TSH normal. .  She had been started on metoprolol and this was stopped, she is continued on Imdur 60 mg daily. Follow-up was arranged with Dr. Percival Spanish after completion of the Zio patch. Patient has been back to the ER twice since then including earlier today.   Per husband says that she keeps calling him while he is at work complaining of chest pain and "feeling like she is going to die" over when he gets home she seems to calm down and does not complain of the chest pain or palpitations is much.  He has brought her back to the emergency room each time because of the symptoms.   Per cardiology notes her current symptoms are not felt likely all secondary to cardiac disease, and GI evaluation was  suggested.   Patient is irritable today and trying to demand endoscopy be done today.  When I related that that was not possible she said she would go back to the emergency room and see if she could get an endoscopy done.  She seems to be focusing on endoscopy solving her current issues.  I asked her if she was having problems with anxiety, she is vague but says sometimes, Asked about depression and she says only because of all of these recent issues.   They do have appointment with primary care early next week. She denies any NSAID use Last colonoscopy very recent January 2023 with 4 small polyps removed all tubular adenomas and noted multiple diverticuli. Prior EGD 2018 with multiple 5 to 10 mm gastric polyps, inflammatory appearing one of the larger polyps was removed and hemoclipped.  Biopsy consistent with hyperplastic polyps.   Review of SystemsPertinent positive and negative review of systems were noted in the above HPI section.  All other review of systems was otherwise negative.        Outpatient Encounter Medications as of 03/19/2022  Medication Sig   ACCU-CHEK AVIVA PLUS test strip USE AS INSTRUCTED ONCE A DAY. DX CODE E11.9   ACCU-CHEK SOFTCLIX LANCETS lancets Use as instructed once a day.  DX Code: E11.9   allopurinol (ZYLOPRIM) 300 MG tablet Take 1 tablet (300 mg total) by mouth 2 (two) times daily.   ALPRAZolam (XANAX) 0.25 MG tablet Take 1 tablet (0.25 mg total) by mouth 2 (two) times daily as needed for anxiety.   aspirin 81 MG EC tablet Take 1 tablet (81 mg total) by mouth daily.   bisacodyl (DULCOLAX) 5 MG EC tablet Take 1 tablet (5 mg total) by mouth at bedtime for 14 days.   Calcium Carb-Cholecalciferol (CALCIUM 600-D PO) Take 1 tablet by mouth 2 (two) times daily before a meal.   docusate sodium (COLACE) 100 MG capsule Take 1 capsule (100 mg total) by mouth 2 (two) times daily as needed for mild constipation.   Emollient (UDDERLY SMOOTH) CREA Apply 1 application topically See admin instructions. Apply topically to skin folds as needed for rash/irritation   fenofibrate 160 MG tablet TAKE 1 TABLET BY MOUTH EVERY DAY (Patient taking differently: Take 160 mg by mouth daily.)   ferrous sulfate 325 (65 FE) MG tablet TAKE 1 TABLET BY MOUTH TWICE A DAY (Patient taking differently: Take 325 mg by mouth 2 (two) times daily with a meal.)   isosorbide mononitrate (IMDUR) 60 MG 24 hr tablet Take 1 tablet (60 mg total) by mouth daily.    olmesartan (BENICAR) 5 MG tablet TAKE 1 TABLET (5 MG TOTAL) BY MOUTH DAILY.   ondansetron (ZOFRAN) 4 MG tablet Take 1 tablet (4 mg total) by mouth every 8 (eight) hours as needed for nausea or vomiting.   polyethylene glycol (MIRALAX / GLYCOLAX) 17 g packet Take 17 g by mouth daily.   rosuvastatin (CRESTOR) 40 MG tablet Take 1 tablet (40 mg total) by mouth daily. PLEASE MAKE APPOINTMENT WITH CARDIOLOGIST FOR FURTHER REFILLS (Patient taking differently: Take 40 mg by mouth daily.)   sucralfate (CARAFATE) 1 g tablet Take 1 tablet (1 g total) by mouth 4 (four) times daily -  with meals and at bedtime for 14 days.   Vitamin D, Ergocalciferol, (DRISDOL) 1.25 MG (50000 UNIT) CAPS capsule TAKE 1 CAPSULE BY MOUTH ONE TIME PER WEEK (Patient taking differently: Take 50,000 Units by mouth every 7 (seven) days. Takes on Monday's)   [  DISCONTINUED] JANUMET XR 50-500 MG TB24 TAKE 1 TABLET BY MOUTH EVERY DAY   dexlansoprazole (DEXILANT) 60 MG capsule Take 1 capsule (60 mg total) by mouth daily.   [DISCONTINUED] dexlansoprazole (DEXILANT) 60 MG capsule TAKE 1 CAPSULE BY MOUTH 2 TIMES DAILY. (Patient taking differently: Take 60 mg by mouth in the morning and at bedtime.)   [DISCONTINUED] pantoprazole (PROTONIX) 40 MG tablet Take 1 tablet (40 mg total) by mouth daily.    No facility-administered encounter medications on file as of 03/19/2022.         Allergies  Allergen Reactions   Other Other (See Comments)      Some BANDAIDS cause skin irritation    Penicillins Itching      Has patient had a PCN reaction causing immediate rash, facial/tongue/throat swelling, SOB or lightheadedness with hypotension: No Has patient had a PCN reaction causing severe rash involving mucus membranes or skin necrosis: No Has patient had a PCN reaction that required hospitalization No Has patient had a PCN reaction occurring within the last 10 years: No If all of the above answers are "NO", then may proceed with Cephalosporin use.           Patient Active Problem List    Diagnosis Date Noted   Epigastric pain 03/16/2022   Constipation 03/16/2022   Influenza A 09/16/2021   Acute cystitis without hematuria 09/16/2021   Closed nondisplaced fracture of metatarsal bone of right foot, initial encounter 40/34/7425   Acute metabolic encephalopathy 95/63/8756   Cryptogenic stroke ([place]) 01/13/2021   GERD without esophagitis 11/26/2020   Iron deficiency 11/26/2020   Generalized abdominal pain 11/26/2020   Pneumonia due to COVID-19 virus     Mixed hyperlipidemia due to type 2 diabetes mellitus ([place]) 11/27/2019   Family history of breast cancer in mother 11/27/2019   Cataract of both eyes 11/27/2019   Coronary artery disease involving native coronary artery of native heart without angina pectoris 02/18/2018   NSTEMI (non-ST elevated myocardial infarction) ([place])     Chest pain of uncertain etiology 43/32/9518   Need for shingles vaccine 09/21/2017   Need for pneumococcal vaccine 09/21/2017   History of rheumatic fever 06/30/2017   PFO (patent foramen ovale)     Cerebrovascular accident (CVA) due to embolism of right middle cerebral artery ([place]) 03/14/2017   Dilation of biliary tract     Elevated LFTs     RUQ abdominal pain     Acute gallstone pancreatitis 12/07/2016   Choledocholithiasis with obstruction 12/07/2016   AKI (acute kidney injury) ([place]) 12/07/2016   Osteoarthritis of right knee 08/23/2015   Status post total left knee replacement 08/23/2015   Arthritis of knee, right 04/20/2014   Status post total right knee replacement 04/20/2014   Flushing reaction 03/10/2012   Diabetes mellitus ([place]) 02/17/2007   Mixed hyperlipidemia 02/17/2007   GOUT 02/17/2007   Severe obesity (BMI >= 40) ([place]) 02/17/2007   Essential hypertension 02/17/2007   RHEUMATIC FEVER, HX OF 02/17/2007    Social History         Socioeconomic History   Marital status: Married      Spouse name: [name]   Number of children: 2   Years of  education: Not on file   Highest education level: Not on file  Occupational History   Occupation: housewife      Employer: UNEMPLOYED  Tobacco Use   Smoking status: Never   Smokeless tobacco: Never  Vaping Use   Vaping Use: Never used  Substance and  Sexual Activity   Alcohol use: Not Currently      Comment: rarely in the past   Drug use: Never   Sexual activity: Yes      Partners: Male  Other Topics Concern   Not on file  Social History Narrative    No exercise secondary to knee pain    Social Determinants of Health    Financial Resource Strain: Not on file  Food Insecurity: Not on file  Transportation Needs: Not on file  Physical Activity: Not on file  Stress: Not on file  Social Connections: Not on file  Intimate Partner Violence: Not on file      Ms. Lammert's family history includes Breast cancer in her mother; Crohn's disease in her son; Diabetes in her father and mother; Heart disease in her father; Hyperlipidemia in her father; Hypertension in her mother; Irritable bowel syndrome in her son; Stroke in her father.         Objective:    Objective [] Expand by Default    Vitals:    03/19/22 1330  BP: 118/70  Pulse: 95  SpO2: 98%      Physical Exam. Well-developed well-nourished older white female in no acute distress.  Patient is in a wheelchair and accompanied by her husband  Weight, 178 BMI 28.7   HEENT; nontraumatic normocephalic, EOMI, PE R LA, sclera anicteric. Oropharynx; not examined today Neck; supple, no JVD Cardiovascular; regular rate and rhythm with S1-S2, no murmur rub or gallop Pulmonary; Clear bilaterally Abdomen; soft, mild tenderness in the epigastrium no guarding no rebound nondistended, no palpable mass or hepatosplenomegaly, bowel sounds are active Rectal; not done today Skin; benign exam, no jaundice rash or appreciable lesions Extremities; no clubbing cyanosis or edema skin warm and dry Neuro/Psych; alert and oriented x4, grossly  nonfocal .  Patient is irritable, affect flat           Assessment & Plan:    #33 71 year old white female with 5-6 recent ER visits over the past 2 weeks each time with complaints of chest pain and palpitations.  She has had fairly extensive work-up as outlined above, and has been evaluated by cardiology.  She is undergoing Zio patch monitoring, and is on Imdur.  She had been on metoprolol but this was stopped due to mild bradycardia. Troponins repeatedly negative CT angiography 03/16/2022 negative, did note chronic air in the biliary tree.   #2 vague GI complaints with epigastric and lower abdominal discomfort, constipation, lack of appetite and weight loss of about 20 pounds over the past 6 months. Again extensive evaluation has been done.  CT of the abdomen and pelvis June 2023 unremarkable other than diverticulosis and mild changes of fatty liver. She is on high-dose PPI, for chronic GERD I doubt she has any significant gastric pathology, but will need to rule out.   She is status post cholecystectomy, had prior history of choledocholithiasis.  She has not had any diet ductal dilation on recent imaging and LFTs have been normal.   I suspect she may be having significant anxiety, and/or depression, and it sounds as if she has been panicking at home when she feels the palpitations.  Patient seems dependent on her husband today for history, did not seem to know which medication she was on, and was very irritable. I am concerned with anxiety/depression   #3 history of adenomatous colon polyps-up-to-date with colonoscopy just done January 2023 4.  Diverticulosis 5.  Prior history of CVA 6.  Hypertension 8.  Adult onset diabetes mellitus   Plan; explained to the patient and her husband today that she was not going to have an endoscopy today, and that if she chose to go back to the emergency room she still would not have an endoscopy today.  I do not see any emergent need for endoscopy. We  will schedule her for upper endoscopy with Dr. Henrene Pastor, in the [facility].  I explained it would be best if this be done after she completes the Zio patch monitoring for palpitations and gets cleared from cardiac standpoint.   She does not need to be on Dexilant and Protonix. Will change Dexilant to 60 mg once daily in the a.m. and stop Protonix She can use Zofran which they have at home 4 mg every 6 hours as needed for nausea For constipation symptoms advised MiraLAX 17 g in 8 ounces of water every day, she can take Colace 1-2 at bedtime if needed I will give her a short-term trial of Xanax 0.251 p.o. twice daily as needed for anxiety, to get them through the next few days until she can be seen by primary care early next week, at which time I think there needs to be further discussion about underlying significant anxiety, depression etc.

## 2022-04-10 NOTE — Progress Notes (Signed)
Called to room to assist during endoscopic procedure.  Patient ID and intended procedure confirmed with present staff. Received instructions for my participation in the procedure from the performing physician.  

## 2022-04-13 ENCOUNTER — Ambulatory Visit: Payer: BC Managed Care – PPO | Admitting: Internal Medicine

## 2022-04-13 ENCOUNTER — Telehealth: Payer: Self-pay

## 2022-04-13 ENCOUNTER — Telehealth: Payer: Self-pay | Admitting: Internal Medicine

## 2022-04-13 NOTE — Telephone Encounter (Signed)
Sent patient a mychart message that the medications she requested need to be refilled by her pcp.

## 2022-04-13 NOTE — Telephone Encounter (Signed)
  Follow up Call-     04/10/2022   10:15 AM 10/13/2021    2:53 PM  Call back number  Post procedure Call Back phone  # [phone]  Permission to leave phone message Yes Yes     Patient questions:  Do you have a fever, pain , or abdominal swelling? No. Pain Score  0 *  Have you tolerated food without any problems? Yes.    Have you been able to return to your normal activities? Yes.    Do you have any questions about your discharge instructions: Diet   No. Medications  No. Follow up visit  No.  Do you have questions or concerns about your Care? No.  Actions: * If pain score is 4 or above: No action needed, pain <4.

## 2022-04-13 NOTE — Telephone Encounter (Signed)
Inbound call from patient requesting a refill for Lorazepam and Sucralfate. Dr. Henrene Pastor did not give her these medications so I'm not sure if they can be refilled by him. Please advise.

## 2022-04-14 ENCOUNTER — Encounter: Payer: Self-pay | Admitting: Internal Medicine

## 2022-04-14 ENCOUNTER — Telehealth: Payer: Self-pay | Admitting: Physician Assistant

## 2022-04-16 ENCOUNTER — Encounter: Payer: Self-pay | Admitting: *Deleted

## 2022-04-23 NOTE — Progress Notes (Signed)
Cardiology Office Note   Date:  04/24/2022   ID:  Rachel Black, Rachel Black [birth year], MRN [id number]  PCP:  Lorrene Reid, PA-C  Cardiologist:   Minus Breeding, MD   Chief Complaint  Patient presents with   Palpitations      History of Present Illness: Rachel Black is a 71 y.o. female who presents for follow-up of coronary artery disease.  She was admitted in February 2019.  She had a non-STEMI.  She had an occluded second marginal.  She was managed medically.  She has had a cryptogenic stroke.  She has an implanted loop recorder.  She did have a PFO on TEE.     Since I last saw her in June she has been in the ED 8 times.  She had no sustained arrhythmias.  Symptoms did not occur necessarily with her PVCs or occasional junctional beats.  She does not describe chest discomfort and I did go back and review to emergency room consult by our team.  What she is really describing is palpitations.  However, there were no significant dysrhythmias on the monitor despite having palpitations.  She was on beta-blockers after 1 consultation in late June but taken off of this at the beginning of July because of some bradycardia arrhythmias on the monitor and telemetry.  She has not noticed any difference.  She is not currently having new symptoms.  She will feel it skipping occasionally.  However, with me she does not describe chest pressure, neck or arm discomfort.  She has not had any presyncope or syncope.  She had no new shortness of breath, PND or orthopnea.  She gets around slowly with her house because of knee pain   Past Medical History:  Diagnosis Date   Arthritis    PAIN AND OA BOTH KNEES AND SHOULDERS AND ELBOWS AND WRIST   Blood transfusion without reported diagnosis 2018   after cholecystectomy   Broken foot, right, closed, initial encounter 09/15/2021   no surgery needed, fell at home and went to ED   Diabetes mellitus    ORAL MEDICATION   GERD (gastroesophageal reflux disease)     Gout    NO RECENT FLARE UPS   H/O: rheumatic fever    AS A CHILD - NO KNOWN HEART MURMUR OR HEART PROBLEMS   Heart attack ([place])    Hyperlipidemia    Hypertension    PFO (patent foramen ovale)    Stroke ([place]) 03/2017    Past Surgical History:  Procedure Laterality Date   ABDOMINAL HYSTERECTOMY     CESAREAN SECTION     CHOLECYSTECTOMY N/A 12/09/2016   Procedure: LAPAROSCOPIC CHOLECYSTECTOMY WITH INTRAOPERATIVE CHOLANGIOGRAM;  Surgeon: Stark Klein, MD;  Location: [facility];  Service: General;  Laterality: N/A;   COLONOSCOPY     COLONOSCOPY WITH PROPOFOL N/A 01/30/2013   Procedure: COLONOSCOPY WITH PROPOFOL;  Surgeon: Irene Shipper, MD;  Location: [facility];  Service: Endoscopy;  Laterality: N/A;   ERCP N/A 12/08/2016   Procedure: ENDOSCOPIC RETROGRADE CHOLANGIOPANCREATOGRAPHY (ERCP);  Surgeon: Ladene Artist, MD;  Location: [facility];  Service: Endoscopy;  Laterality: N/A;   ESOPHAGOGASTRODUODENOSCOPY (EGD) WITH PROPOFOL N/A 01/30/2013   Procedure: ESOPHAGOGASTRODUODENOSCOPY (EGD) WITH PROPOFOL;  Surgeon: Irene Shipper, MD;  Location: [facility];  Service: Endoscopy;  Laterality: N/A;   LEFT HEART CATH AND CORONARY ANGIOGRAPHY N/A 10/18/2017   Procedure: LEFT HEART CATH AND CORONARY ANGIOGRAPHY;  Surgeon: Martinique, Peter M, MD;  Location: [facility];  Service: Cardiovascular;  Laterality: N/A;   LOOP RECORDER INSERTION N/A 03/18/2017   Procedure: Loop Recorder Insertion;  Surgeon: Constance Haw, MD;  Location: [place] [facility];  Service: Cardiovascular;  Laterality: N/A;   PARTIAL HYSTERECTOMY     TEE WITHOUT CARDIOVERSION N/A 03/16/2017   Procedure: TRANSESOPHAGEAL ECHOCARDIOGRAM (TEE);  Surgeon: Acie Fredrickson Wonda Cheng, MD;  Location: [facility];  Service: Cardiovascular;  Laterality: N/A;   TOTAL KNEE ARTHROPLASTY Right 04/20/2014   Procedure: RIGHT TOTAL KNEE ARTHROPLASTY CONVERTED TO RIGHT KNEE REIMPLANTATION;  Surgeon: Mcarthur Rossetti, MD;  Location: [facility];  Service: Orthopedics;  Laterality: Right;   TOTAL KNEE ARTHROPLASTY Left 08/23/2015   Procedure: LEFT TOTAL KNEE ARTHROPLASTY;  Surgeon: Mcarthur Rossetti, MD;  Location: [facility];  Service: Orthopedics;  Laterality: Left;   VENTRAL HERNIA REPAIR     x2     Current Outpatient Medications  Medication Sig Dispense Refill   ACCU-CHEK AVIVA PLUS test strip USE AS INSTRUCTED ONCE A DAY. DX CODE E11.9 100 strip 1   Accu-Chek Softclix Lancets lancets Use as instructed once a day.  DX Code: E11.9 100 each 1   allopurinol (ZYLOPRIM) 300 MG tablet Take 1 tablet (300 mg total) by mouth 2 (two) times daily. 180 tablet 1   ALPRAZolam (XANAX) 0.25 MG tablet Take 1 tablet (0.25 mg total) by mouth 2 (two) times daily as needed for anxiety. 10 tablet 0   aspirin 81 MG EC tablet Take 1 tablet (81 mg total) by mouth daily.     Calcium Carb-Cholecalciferol (CALCIUM 600-D PO) Take 1 tablet by mouth 2 (two) times daily before a meal.     dexlansoprazole (DEXILANT) 60 MG capsule Take 1 capsule (60 mg total) by mouth daily. 30 capsule 4   dicyclomine (BENTYL) 20 MG tablet Take 1 tablet (20 mg total) by mouth 2 (two) times daily as needed (abdominal cramping). 20 tablet 0   docusate sodium (COLACE) 100 MG capsule Take 1 capsule (100 mg total) by mouth 2 (two) times daily as needed for mild constipation. 15 capsule 0   Emollient (UDDERLY SMOOTH) CREA Apply 1 application topically See admin instructions. Apply topically to skin folds as needed for rash/irritation     Famotidine (PEPCID PO) Take 1 tablet by mouth daily as needed (heartburn).     fenofibrate 160 MG tablet TAKE 1 TABLET BY MOUTH EVERY DAY (Patient taking differently: Take 160 mg by mouth daily.) 90 tablet 1   ferrous sulfate 325 (65 FE) MG tablet TAKE 1 TABLET BY MOUTH TWICE A DAY (Patient taking differently: Take 325 mg by mouth 2 (two) times daily with a meal.) 180 tablet 0   isosorbide mononitrate (IMDUR) 60 MG 24 hr tablet Take 1 tablet (60 mg  total) by mouth daily. 90 tablet 3   LORazepam (ATIVAN) 1 MG tablet Take 1 tablet (1 mg total) by mouth daily as needed for anxiety. 10 tablet 0   olmesartan (BENICAR) 5 MG tablet TAKE 1 TABLET (5 MG TOTAL) BY MOUTH DAILY. 90 tablet 0   ondansetron (ZOFRAN) 4 MG tablet Take 1 tablet (4 mg total) by mouth every 8 (eight) hours as needed for nausea or vomiting. 12 tablet 0   pantoprazole (PROTONIX) 40 MG tablet Take 1 tablet (40 mg total) by mouth daily. 30 tablet 11   polyethylene glycol (MIRALAX / GLYCOLAX) 17 g packet Take 17 g by mouth daily. 14 each 0   rosuvastatin (CRESTOR) 40 MG tablet Take 1 tablet (40 mg  total) by mouth daily. 90 tablet 2   sucralfate (CARAFATE) 1 g tablet Take 1 tablet (1 g total) by mouth 4 (four) times daily -  with meals and at bedtime for 14 days. 56 tablet 0   sucralfate (CARAFATE) 1 g tablet Take 1 g by mouth daily.     Vitamin D, Ergocalciferol, (DRISDOL) 1.25 MG (50000 UNIT) CAPS capsule TAKE 1 CAPSULE BY MOUTH ONE TIME PER WEEK (Patient taking differently: Take 50,000 Units by mouth every 7 (seven) days. Takes on Monday's) 12 capsule 0   No current facility-administered medications for this visit.    Allergies:   Other and Penicillins    ROS:  Please see the history of present illness.   Otherwise, review of systems are positive for weakness and fatigue.   All other systems are reviewed and negative.    PHYSICAL EXAM: VS:  BP 105/69   Pulse 85   Ht 5' 6"  (1.676 m)   Wt 177 lb 12.8 oz (80.6 kg)   SpO2 98%   BMI 28.70 kg/m  , BMI Body mass index is 28.7 kg/m.  GENERAL:  Well appearing NECK:  No jugular venous distention, waveform within normal limits, carotid upstroke brisk and symmetric, no bruits, no thyromegaly LUNGS:  Clear to auscultation bilaterally CHEST:  Unremarkable HEART:  PMI not displaced or sustained,S1 and S2 within normal limits, no S3, no S4, no clicks, no rubs, no murmurs ABD:  Flat, positive bowel sounds normal in frequency in  pitch, no bruits, no rebound, no guarding, no midline pulsatile mass, no hepatomegaly, no splenomegaly EXT:  2 plus pulses throughout, no edema, no cyanosis no clubbing'    EKG:  EKG is not ordered today.   Cath  Diagnostic Dominance: Right    Recent Labs: 04/04/2022: Magnesium 2.1; TSH 1.845 04/09/2022: ALT 16; BUN 29; Creatinine, Ser 1.30; Hemoglobin 11.6; Platelets 298; Potassium 4.6; Sodium 139    Lipid Panel    Component Value Date/Time   CHOL 119 08/26/2021 1050   TRIG 213 (H) 08/26/2021 1050   HDL 31 (L) 08/26/2021 1050   CHOLHDL 3.8 08/26/2021 1050   CHOLHDL 4 12/21/2017 1044   VLDL 59.8 (H) 12/21/2017 1044   LDLCALC 53 08/26/2021 1050   LDLDIRECT 63 11/27/2019 1428   LDLDIRECT 50.0 12/21/2017 1044      Wt Readings from Last 3 Encounters:  04/24/22 177 lb 12.8 oz (80.6 kg)  04/10/22 178 lb (80.7 kg)  04/09/22 178 lb (80.7 kg)      Other studies Reviewed: Additional studies/ records that were reviewed today include: Review of 8 different ER visits.  Please see elsewhere    ASSESSMENT AND PLAN:  CAD:   The patient had single branch vessel disease.  She is not really describing chest discomfort.  No further testing is suggested at this time and she can continue with the nitrates.  If chest discomfort becomes a predominant complaint we could consider repeat catheterization to look for microvascular disease.   HTN:    The blood pressure is at target.  No change in therapy.   PALPITATIONS: I think that she does have occasional skipped beats.  It is not made better by beta-blockers.  I do not think there is a need for further antiarrhythmics.  There were no sustained arrhythmias and symptoms did not necessarily correlate.  No further testing.   PFO: This was an incidental finding.  No change in therapy.    Current medicines are reviewed at length with the  patient today.  The patient does not have concerns regarding medicines.  The following changes have  been made: None  Labs/ tests ordered today include: None   No orders of the defined types were placed in this encounter.    Disposition:   FU with APP in six months.     Signed, Minus Breeding, MD  04/24/2022 3:32 PM    [facility]

## 2022-04-24 ENCOUNTER — Encounter: Payer: Self-pay | Admitting: Cardiology

## 2022-04-24 ENCOUNTER — Ambulatory Visit (INDEPENDENT_AMBULATORY_CARE_PROVIDER_SITE_OTHER): Payer: BC Managed Care – PPO | Admitting: Cardiology

## 2022-04-24 VITALS — BP 105/69 | HR 85 | Ht 66.0 in | Wt 177.8 lb

## 2022-04-24 DIAGNOSIS — I10 Essential (primary) hypertension: Secondary | ICD-10-CM

## 2022-04-24 DIAGNOSIS — R002 Palpitations: Secondary | ICD-10-CM

## 2022-04-24 DIAGNOSIS — R072 Precordial pain: Secondary | ICD-10-CM | POA: Diagnosis not present

## 2022-04-24 NOTE — Patient Instructions (Signed)
Medication Instructions:  Your Physician recommend you continue on your current medication as directed.    *If you need a refill on your cardiac medications before your next appointment, please call your pharmacy*   Lab Work: None ordered today   Testing/Procedures: None ordered today   Follow-Up: At [facility], you and your health needs are our priority.  As part of our continuing mission to provide you with exceptional heart care, we have created designated Provider Care Teams.  These Care Teams include your primary Cardiologist (physician) and Advanced Practice Providers (APPs -  Physician Assistants and Nurse Practitioners) who all work together to provide you with the care you need, when you need it.  We recommend signing up for the patient portal called "MyChart".  Sign up information is provided on this After Visit Summary.  MyChart is used to connect with patients for Virtual Visits (Telemedicine).  Patients are able to view lab/test results, encounter notes, upcoming appointments, etc.  Non-urgent messages can be sent to your provider as well.   To learn more about what you can do with MyChart, go to [email or web address].    Your next appointment:   6 month(s)  The format for your next appointment:   In Person  Provider: APP

## 2022-05-04 ENCOUNTER — Ambulatory Visit (INDEPENDENT_AMBULATORY_CARE_PROVIDER_SITE_OTHER): Payer: BC Managed Care – PPO | Admitting: Podiatry

## 2022-05-04 ENCOUNTER — Encounter: Payer: Self-pay | Admitting: Podiatry

## 2022-05-04 DIAGNOSIS — B351 Tinea unguium: Secondary | ICD-10-CM

## 2022-05-04 DIAGNOSIS — E1169 Type 2 diabetes mellitus with other specified complication: Secondary | ICD-10-CM | POA: Diagnosis not present

## 2022-05-04 DIAGNOSIS — M79675 Pain in left toe(s): Secondary | ICD-10-CM | POA: Diagnosis not present

## 2022-05-04 DIAGNOSIS — Q828 Other specified congenital malformations of skin: Secondary | ICD-10-CM

## 2022-05-04 DIAGNOSIS — M79674 Pain in right toe(s): Secondary | ICD-10-CM | POA: Diagnosis not present

## 2022-05-11 NOTE — Progress Notes (Signed)
  Subjective:  Patient ID: Rachel Black, female    DOB: 11-13-1950,  MRN: [id number]  71 y.o. female presents with preventative diabetic foot care and preulcerative lesion(s) left lower extremity and painful mycotic toenails that limit ambulation. Painful toenails interfere with ambulation. Aggravating factors include wearing enclosed shoe gear. Pain is relieved with periodic professional debridement. Painful porokeratotic lesions are aggravated when weightbearing with and without shoegear. Pain is relieved with periodic professional debridement..    New problem(s): None    Her husband is present during today's visit.  PCP: Lorrene Reid, PA-C and last visit was: March 23, 2022.  Review of Systems: Negative except as noted in the HPI.   Allergies  Allergen Reactions   Other Other (See Comments)    Some BANDAIDS cause skin irritation    Penicillins Itching    Has patient had a PCN reaction causing immediate rash, facial/tongue/throat swelling, SOB or lightheadedness with hypotension: No Has patient had a PCN reaction causing severe rash involving mucus membranes or skin necrosis: No Has patient had a PCN reaction that required hospitalization No Has patient had a PCN reaction occurring within the last 10 years: No If all of the above answers are "NO", then may proceed with Cephalosporin use.     Objective:  There were no vitals filed for this visit. Constitutional Patient is a pleasant 71 y.o. Caucasian female in NAD. AAO x 3.  Vascular Capillary fill time to digits immediate b/l.  DP/PT pulse(s) are palpable b/l lower extremities. Pedal hair sparse. Lower extremity skin temperature gradient within normal limits. No pain with calf compression b/l. No edema noted b/l lower extremities. No cyanosis or clubbing noted.   Neurologic Protective sensation intact 5/5 intact bilaterally with 10g monofilament b/l. Vibratory sensation intact b/l. No clonus b/l.   Dermatologic Pedal skin is warm  and supple b/l.  No open wounds b/l lower extremities. No interdigital macerations b/l lower extremities. Toenails 1-5 b/l elongated, discolored, dystrophic, thickened, crumbly with subungual debris and tenderness to dorsal palpation. Porokeratotic lesion(s) submet head 1 left foot. No erythema, no edema, no drainage, no fluctuance. Lesion has improved and there is minimal subcutaneous hemorrhage noted.  Orthopedic: Normal muscle strength 5/5 to all lower extremity muscle groups bilaterally. Patient ambulates independent of any assistive aids. HAV with bunion deformity noted b/l LE. Pes planus deformity noted bilateral LE.      Latest Ref Rng & Units 03/23/2022    8:41 AM 12/24/2021    3:12 PM 09/16/2021    4:06 AM 08/26/2021   10:49 AM  Hemoglobin A1C  Hemoglobin-A1c 4.0 - 5.6 % 5.3  5.1  5.4  5.2    Assessment:   1. Pain due to onychomycosis of toenails of both feet   2. Porokeratosis   3. Type 2 diabetes mellitus with other specified complication, without long-term current use of insulin ([place])    Plan:  Patient was evaluated and treated and all questions answered. Consent given for treatment as described below: -Patient's family member present. All questions/concerns addressed on today's visit. -Examined patient. -Patient has improvement in appearance of plantar lesion left foot since wearing her diabetic shoes. Continue shoes daily. -Continue foot and shoe inspections daily. Monitor blood glucose per PCP/Endocrinologist's recommendations. -Toenails 1-5 b/l were debrided in length and girth with sterile nail nippers and dremel without iatrogenic bleeding.  -Patient/POA to call should there be question/concern in the interim.  Return in about 3 months (around 08/04/2022).  Marzetta Board, DPM

## 2022-05-14 ENCOUNTER — Other Ambulatory Visit: Payer: Self-pay | Admitting: Gastroenterology

## 2022-05-14 ENCOUNTER — Other Ambulatory Visit: Payer: Self-pay | Admitting: Physician Assistant

## 2022-05-14 DIAGNOSIS — M1000 Idiopathic gout, unspecified site: Secondary | ICD-10-CM

## 2022-05-14 DIAGNOSIS — K317 Polyp of stomach and duodenum: Secondary | ICD-10-CM

## 2022-07-10 ENCOUNTER — Other Ambulatory Visit: Payer: Self-pay | Admitting: Physician Assistant

## 2022-07-10 DIAGNOSIS — E559 Vitamin D deficiency, unspecified: Secondary | ICD-10-CM

## 2022-07-10 DIAGNOSIS — E1159 Type 2 diabetes mellitus with other circulatory complications: Secondary | ICD-10-CM

## 2022-07-18 ENCOUNTER — Other Ambulatory Visit: Payer: Self-pay | Admitting: Physician Assistant

## 2022-07-18 DIAGNOSIS — E1151 Type 2 diabetes mellitus with diabetic peripheral angiopathy without gangrene: Secondary | ICD-10-CM

## 2022-07-23 ENCOUNTER — Ambulatory Visit: Payer: BC Managed Care – PPO | Admitting: Physician Assistant

## 2022-08-02 ENCOUNTER — Other Ambulatory Visit: Payer: Self-pay | Admitting: Physician Assistant

## 2022-08-02 ENCOUNTER — Other Ambulatory Visit: Payer: Self-pay | Admitting: Gastroenterology

## 2022-08-02 DIAGNOSIS — M1000 Idiopathic gout, unspecified site: Secondary | ICD-10-CM

## 2022-08-02 DIAGNOSIS — K317 Polyp of stomach and duodenum: Secondary | ICD-10-CM

## 2022-08-03 NOTE — Telephone Encounter (Signed)
Office visit is required for future refills

## 2022-08-03 NOTE — Telephone Encounter (Signed)
Please advise 

## 2022-08-18 ENCOUNTER — Ambulatory Visit: Payer: BC Managed Care – PPO | Admitting: Podiatry

## 2022-08-27 ENCOUNTER — Other Ambulatory Visit: Payer: Self-pay | Admitting: Nurse Practitioner

## 2022-08-27 DIAGNOSIS — M1000 Idiopathic gout, unspecified site: Secondary | ICD-10-CM

## 2022-09-09 ENCOUNTER — Telehealth: Payer: Self-pay | Admitting: *Deleted

## 2022-09-09 NOTE — Telephone Encounter (Signed)
Pt husband came in and said pt needs refill on below.   fenofibrate 160 MG tablet   Pharmacy:[id number] [place] [address]   LOV:03/23/22 ROV:09/28/22

## 2022-09-09 NOTE — Telephone Encounter (Signed)
Just to make sure I understand, is the prescription for Olena Heckle?

## 2022-09-10 ENCOUNTER — Other Ambulatory Visit: Payer: Self-pay | Admitting: Nurse Practitioner

## 2022-09-10 DIAGNOSIS — E785 Hyperlipidemia, unspecified: Secondary | ICD-10-CM

## 2022-09-10 MED ORDER — FENOFIBRATE 160 MG PO TABS: 160.0000 mg | ORAL_TABLET | Freq: Every day | ORAL | 1 refills | Status: DC

## 2022-09-10 NOTE — Telephone Encounter (Signed)
I have sent this prescription

## 2022-09-28 ENCOUNTER — Ambulatory Visit (INDEPENDENT_AMBULATORY_CARE_PROVIDER_SITE_OTHER): Payer: BC Managed Care – PPO | Admitting: Nurse Practitioner

## 2022-09-28 ENCOUNTER — Encounter: Payer: Self-pay | Admitting: Nurse Practitioner

## 2022-09-28 VITALS — BP 100/66 | HR 73 | Ht 66.0 in | Wt 183.8 lb

## 2022-09-28 DIAGNOSIS — E1159 Type 2 diabetes mellitus with other circulatory complications: Secondary | ICD-10-CM | POA: Diagnosis not present

## 2022-09-28 DIAGNOSIS — L209 Atopic dermatitis, unspecified: Secondary | ICD-10-CM | POA: Diagnosis not present

## 2022-09-28 DIAGNOSIS — E1169 Type 2 diabetes mellitus with other specified complication: Secondary | ICD-10-CM

## 2022-09-28 DIAGNOSIS — M064 Inflammatory polyarthropathy: Secondary | ICD-10-CM

## 2022-09-28 DIAGNOSIS — K581 Irritable bowel syndrome with constipation: Secondary | ICD-10-CM

## 2022-09-28 DIAGNOSIS — E782 Mixed hyperlipidemia: Secondary | ICD-10-CM

## 2022-09-28 DIAGNOSIS — I152 Hypertension secondary to endocrine disorders: Secondary | ICD-10-CM

## 2022-09-28 LAB — POCT GLYCOSYLATED HEMOGLOBIN (HGB A1C): HbA1c POC (<> result, manual entry): 5.6 % (ref 4.0–5.6)

## 2022-09-28 MED ORDER — TRIAMCINOLONE ACETONIDE 0.025 % EX CREA: 1.0000 | TOPICAL_CREAM | Freq: Two times a day (BID) | CUTANEOUS | 2 refills | Status: DC

## 2022-09-28 MED ORDER — LINACLOTIDE 72 MCG PO CAPS: 72.0000 ug | ORAL_CAPSULE | Freq: Every day | ORAL | 2 refills | Status: DC

## 2022-09-28 NOTE — Progress Notes (Signed)
Established patient visit   Patient: Rachel Black   DOB: [birth year]   72 y.o. Female  MRN: [id number] Visit Date: 09/28/2022   Chief Complaint  Patient presents with   Follow-up   Subjective    HPI  Follow up  -type 2 diabetes  --HgbA1c 5.6 today.  --controlled with diet and lifestyle  -hypertension  --well managed.  -hyperlipidemia  --taking rosuvastatin and fenofibrate  -left hip pain  --bilateral knee pain. Has had bilateral knee replacements.  -lower abdominal pain.  --chronic constipation  --eats prunes everyday to help her have bowel movement.  --she does have a GI provider.  -she states that abdominal pain is relieved by having bowel movements.  -itchy skin on lower back/sides. Scratching severely. Used benadryl cream to effected areas today  -due for mammogram   Medications: Outpatient Medications Prior to Visit  Medication Sig   ACCU-CHEK AVIVA PLUS test strip USE AS INSTRUCTED ONCE A DAY. DX CODE E11.9   Accu-Chek Softclix Lancets lancets Use as instructed once a day.  DX Code: E11.9   allopurinol (ZYLOPRIM) 300 MG tablet TAKE 1 TABLET BY MOUTH TWICE A DAY   aspirin 81 MG EC tablet Take 1 tablet (81 mg total) by mouth daily.   Emollient (UDDERLY SMOOTH) CREA Apply 1 application topically See admin instructions. Apply topically to skin folds as needed for rash/irritation   fenofibrate 160 MG tablet Take 1 tablet (160 mg total) by mouth daily.   ferrous sulfate 325 (65 FE) MG tablet TAKE 1 TABLET BY MOUTH TWICE A DAY (Patient taking differently: Take 325 mg by mouth daily.)   isosorbide mononitrate (IMDUR) 60 MG 24 hr tablet Take 1 tablet (60 mg total) by mouth daily. (Patient taking differently: Take 30 mg by mouth daily.)   rosuvastatin (CRESTOR) 40 MG tablet Take 1 tablet (40 mg total) by mouth daily.   [DISCONTINUED] ALPRAZolam (XANAX) 0.25 MG tablet Take 1 tablet (0.25 mg total) by mouth 2 (two) times daily as needed for anxiety.   [DISCONTINUED] Calcium  Carb-Cholecalciferol (CALCIUM 600-D PO) Take 1 tablet by mouth 2 (two) times daily before a meal.   [DISCONTINUED] dexlansoprazole (DEXILANT) 60 MG capsule Take 1 capsule (60 mg total) by mouth daily.   [DISCONTINUED] olmesartan (BENICAR) 5 MG tablet TAKE 1 TABLET (5 MG TOTAL) BY MOUTH DAILY.   [DISCONTINUED] ondansetron (ZOFRAN) 4 MG tablet Take 1 tablet (4 mg total) by mouth every 8 (eight) hours as needed for nausea or vomiting.   [DISCONTINUED] pantoprazole (PROTONIX) 40 MG tablet Take 1 tablet (40 mg total) by mouth daily. (Patient not taking: Reported on 10/03/2022)   [DISCONTINUED] Vitamin D, Ergocalciferol, (DRISDOL) 1.25 MG (50000 UNIT) CAPS capsule TAKE 1 CAPSULE BY MOUTH ONE TIME PER WEEK (Patient not taking: Reported on 10/03/2022)   [DISCONTINUED] dicyclomine (BENTYL) 20 MG tablet Take 1 tablet (20 mg total) by mouth 2 (two) times daily as needed (abdominal cramping).   [DISCONTINUED] docusate sodium (COLACE) 100 MG capsule Take 1 capsule (100 mg total) by mouth 2 (two) times daily as needed for mild constipation.   [DISCONTINUED] Famotidine (PEPCID PO) Take 1 tablet by mouth daily as needed (heartburn).   [DISCONTINUED] LORazepam (ATIVAN) 1 MG tablet Take 1 tablet (1 mg total) by mouth daily as needed for anxiety.   [DISCONTINUED] polyethylene glycol (MIRALAX / GLYCOLAX) 17 g packet Take 17 g by mouth daily.   [DISCONTINUED] sucralfate (CARAFATE) 1 g tablet Take 1 tablet (1 g total) by mouth 4 (four) times daily -  with meals and at bedtime for 14 days.   [DISCONTINUED] sucralfate (CARAFATE) 1 g tablet Take 1 g by mouth daily.   No facility-administered medications prior to visit.    Review of Systems  Constitutional:  Negative for activity change, appetite change, chills, fatigue and fever.  HENT:  Negative for congestion, postnasal drip, rhinorrhea, sinus pressure, sinus pain, sneezing and sore throat.   Eyes: Negative.   Respiratory:  Negative for cough, chest tightness, shortness  of breath and wheezing.   Cardiovascular:  Negative for chest pain and palpitations.  Gastrointestinal:  Positive for abdominal distention, abdominal pain and constipation. Negative for diarrhea, nausea and vomiting.       Chronic   Endocrine: Negative for cold intolerance, heat intolerance, polydipsia and polyuria.  Genitourinary:  Negative for dyspareunia, dysuria, flank pain, frequency and urgency.  Musculoskeletal:  Positive for arthralgias, gait problem, joint swelling and myalgias. Negative for back pain.  Skin:  Negative for rash.  Allergic/Immunologic: Negative for environmental allergies.  Neurological:  Negative for dizziness, weakness and headaches.  Hematological:  Negative for adenopathy.  Psychiatric/Behavioral:  The patient is not nervous/anxious.     Last CBC Lab Results  Component Value Date   WBC 7.5 10/12/2022   HGB 13.5 10/12/2022   HCT 40.3 10/12/2022   MCV 95.7 10/12/2022   MCH 32.1 10/12/2022   RDW 12.9 10/12/2022   PLT 186 [id number]   Last metabolic panel Lab Results  Component Value Date   GLUCOSE 107 (H) 10/12/2022   NA 136 10/12/2022   K 4.2 10/12/2022   CL 103 10/12/2022   CO2 24 10/12/2022   BUN 11 10/12/2022   CREATININE 1.10 (H) 10/12/2022   GFRNONAA 54 (L) 10/12/2022   CALCIUM 10.2 10/12/2022   PHOS 2.9 09/18/2021   PROT 6.6 10/12/2022   ALBUMIN 4.0 10/12/2022   LABGLOB 2.1 09/25/2021   AGRATIO 2.0 09/25/2021   BILITOT 0.5 10/12/2022   ALKPHOS 48 10/12/2022   AST 25 10/12/2022   ALT 19 10/12/2022   ANIONGAP 9 10/12/2022   Last lipids Lab Results  Component Value Date   CHOL 119 08/26/2021   HDL 31 (L) 08/26/2021   LDLCALC 53 08/26/2021   LDLDIRECT 63 11/27/2019   TRIG 213 (H) 08/26/2021   CHOLHDL 3.8 08/26/2021   Last hemoglobin A1c Lab Results  Component Value Date   HGBA1C 5.6 09/28/2022   Last thyroid functions Lab Results  Component Value Date   TSH 1.845 04/04/2022   Last vitamin D Lab Results  Component Value  Date   VD25OH 42.0 09/24/2020       Objective     Today's Vitals   09/28/22 1416  BP: 100/66  Pulse: 73  SpO2: 100%  Weight: 183 lb 12.8 oz (83.4 kg)  Height: 5' 6"$  (1.676 m)   Body mass index is 29.67 kg/m.  BP Readings from Last 3 Encounters:  10/20/22 105/67  10/13/22 110/66  10/09/22 (Abnormal) 145/86    Wt Readings from Last 3 Encounters:  10/20/22 177 lb (80.3 kg)  10/12/22 177 lb 8 oz (80.5 kg)  10/01/22 182 lb (82.6 kg)    Physical Exam Vitals and nursing note reviewed.  Constitutional:      Appearance: Normal appearance. She is well-developed.  HENT:     Head: Normocephalic and atraumatic.  Eyes:     Pupils: Pupils are equal, round, and reactive to light.  Cardiovascular:     Rate and Rhythm: Normal rate and regular rhythm.  Pulses: Normal pulses.     Heart sounds: Normal heart sounds.  Pulmonary:     Effort: Pulmonary effort is normal.     Breath sounds: Normal breath sounds.  Abdominal:     Palpations: Abdomen is soft.  Musculoskeletal:        General: Normal range of motion.     Cervical back: Normal range of motion and neck supple.  Lymphadenopathy:     Cervical: No cervical adenopathy.  Skin:    General: Skin is warm and dry.     Capillary Refill: Capillary refill takes less than 2 seconds.  Neurological:     General: No focal deficit present.     Mental Status: She is alert and oriented to person, place, and time.  Psychiatric:        Mood and Affect: Mood normal.        Behavior: Behavior normal.        Thought Content: Thought content normal.        Judgment: Judgment normal.      Results for orders placed or performed in visit on 09/28/22  POCT glycosylated hemoglobin (Hb A1C)  Result Value Ref Range   Hemoglobin A1C     HbA1c POC (<> result, manual entry) 5.6 4.0 - 5.6 %   HbA1c, POC (prediabetic range)     HbA1c, POC (controlled diabetic range)      Assessment & Plan    1. Type 2 diabetes mellitus with other specified  complication, without long-term current use of insulin (HCC) HgbA1c 5.6 today. Continue to control with diet and lfestyle adjustments.  - POCT glycosylated hemoglobin (Hb A1C)  2. Hypertension associated with diabetes ([place]) Well controlled.    3. Mixed hyperlipidemia due to type 2 diabetes mellitus ([place]) Well controlled. Continue rosuvastatin and fenofibrate as prescribed.   4. Irritable bowel syndrome with constipation Trial linzess 72 mcg daily.  - linaclotide (LINZESS) 72 MCG capsule; Take 1 capsule (72 mcg total) by mouth daily before breakfast.  Dispense: 30 capsule; Refill: 2  5. Atopic dermatitis, unspecified type Use triamcinolone 0.025% cream twice daily as needed.  - triamcinolone (KENALOG) 0.025 % cream; Apply 1 Application topically 2 (two) times daily.  Dispense: 80 g; Refill: 2  6. Inflammatory polyarthropathy ([place]) Take tylenol as needed and as indicated.    Problem List Items Addressed This Visit       Cardiovascular and Mediastinum   Hypertension associated with diabetes ([place])     Digestive   Irritable bowel syndrome with constipation   Relevant Medications   linaclotide (LINZESS) 72 MCG capsule     Endocrine   Diabetes mellitus ([place]) - Primary (Chronic)   Relevant Orders   POCT glycosylated hemoglobin (Hb A1C) (Completed)   Mixed hyperlipidemia due to type 2 diabetes mellitus (HCC)     Musculoskeletal and Integument   Atopic dermatitis   Relevant Medications   triamcinolone (KENALOG) 0.025 % cream   Inflammatory polyarthropathy (HCC)     Return in about 3 months (around 12/28/2022) for medicare wellness, FBW a week prior to visit. include vitamin d and urine microalbumin .         Ronnell Freshwater, NP  [facility] [phone] (phone) [phone] (fax)  [place]

## 2022-09-28 NOTE — Patient Instructions (Signed)
Changes today: Take over the counter Vitamin D 2000 iu daily. We will recheck your vitamin d before your next visit and change dosing as needed  2. Reduce iron supplement (ferrous sulfate) to once daily  3. Wellness visit in 3 months. Will check labs, including vitamin d and urine for microalbumin prior to that visit.

## 2022-09-29 ENCOUNTER — Emergency Department (HOSPITAL_COMMUNITY): Payer: BC Managed Care – PPO

## 2022-09-29 ENCOUNTER — Encounter (HOSPITAL_COMMUNITY): Payer: Self-pay | Admitting: Emergency Medicine

## 2022-09-29 ENCOUNTER — Emergency Department (HOSPITAL_COMMUNITY)
Admission: EM | Admit: 2022-09-29 | Discharge: 2022-09-29 | Disposition: A | Discharge status: Home / Self Care | Payer: BC Managed Care – PPO | Attending: Emergency Medicine | Admitting: Emergency Medicine

## 2022-09-29 DIAGNOSIS — M25552 Pain in left hip: Secondary | ICD-10-CM | POA: Diagnosis not present

## 2022-09-29 DIAGNOSIS — Z8673 Personal history of transient ischemic attack (TIA), and cerebral infarction without residual deficits: Secondary | ICD-10-CM | POA: Diagnosis not present

## 2022-09-29 DIAGNOSIS — E119 Type 2 diabetes mellitus without complications: Secondary | ICD-10-CM | POA: Diagnosis not present

## 2022-09-29 DIAGNOSIS — Z79899 Other long term (current) drug therapy: Secondary | ICD-10-CM | POA: Insufficient documentation

## 2022-09-29 DIAGNOSIS — R1032 Left lower quadrant pain: Secondary | ICD-10-CM | POA: Diagnosis present

## 2022-09-29 DIAGNOSIS — Z96653 Presence of artificial knee joint, bilateral: Secondary | ICD-10-CM | POA: Insufficient documentation

## 2022-09-29 DIAGNOSIS — Z7982 Long term (current) use of aspirin: Secondary | ICD-10-CM | POA: Diagnosis not present

## 2022-09-29 DIAGNOSIS — I10 Essential (primary) hypertension: Secondary | ICD-10-CM | POA: Insufficient documentation

## 2022-09-29 DIAGNOSIS — N3000 Acute cystitis without hematuria: Secondary | ICD-10-CM | POA: Diagnosis not present

## 2022-09-29 LAB — URINALYSIS, ROUTINE W REFLEX MICROSCOPIC
Bilirubin Urine: NEGATIVE
Glucose, UA: NEGATIVE mg/dL
Hgb urine dipstick: NEGATIVE
Ketones, ur: NEGATIVE mg/dL
Nitrite: POSITIVE — AB
Protein, ur: NEGATIVE mg/dL
Specific Gravity, Urine: 1.015 (ref 1.005–1.030)
WBC, UA: >50 WBC/hpf — ABNORMAL HIGH (ref 0–5)
pH: 5 (ref 5.0–8.0)

## 2022-09-29 LAB — CBC WITH DIFFERENTIAL/PLATELET
Abs Immature Granulocytes: 0.03 10*3/uL (ref 0.00–0.07)
Basophils Absolute: 0 10*3/uL (ref 0.0–0.1)
Basophils Relative: 1 %
Eosinophils Absolute: 0 10*3/uL (ref 0.0–0.5)
Eosinophils Relative: 0 %
HCT: 36.8 % (ref 36.0–46.0)
Hemoglobin: 12.5 g/dL (ref 12.0–15.0)
Immature Granulocytes: 0 %
Lymphocytes Relative: 33 %
Lymphs Abs: 2.4 10*3/uL (ref 0.7–4.0)
MCH: 32.4 pg (ref 26.0–34.0)
MCHC: 34 g/dL (ref 30.0–36.0)
MCV: 95.3 fL (ref 80.0–100.0)
Monocytes Absolute: 0.4 10*3/uL (ref 0.1–1.0)
Monocytes Relative: 5 %
Neutro Abs: 4.5 10*3/uL (ref 1.7–7.7)
Neutrophils Relative %: 61 %
Platelets: 206 10*3/uL (ref 150–400)
RBC: 3.86 MIL/uL — ABNORMAL LOW (ref 3.87–5.11)
RDW: 13.2 % (ref 11.5–15.5)
WBC: 7.3 10*3/uL (ref 4.0–10.5)
nRBC: 0 % (ref 0.0–0.2)

## 2022-09-29 LAB — COMPREHENSIVE METABOLIC PANEL
ALT: 18 U/L (ref 0–44)
AST: 21 U/L (ref 15–41)
Albumin: 3.9 g/dL (ref 3.5–5.0)
Alkaline Phosphatase: 44 U/L (ref 38–126)
Anion gap: 10 (ref 5–15)
BUN: 18 mg/dL (ref 8–23)
CO2: 24 mmol/L (ref 22–32)
Calcium: 9.9 mg/dL (ref 8.9–10.3)
Chloride: 104 mmol/L (ref 98–111)
Creatinine, Ser: 1.14 mg/dL — ABNORMAL HIGH (ref 0.44–1.00)
GFR, Estimated: 52 mL/min — ABNORMAL LOW (ref >60)
Glucose, Bld: 92 mg/dL (ref 70–99)
Potassium: 4.4 mmol/L (ref 3.5–5.1)
Sodium: 138 mmol/L (ref 135–145)
Total Bilirubin: 0.4 mg/dL (ref 0.3–1.2)
Total Protein: 6.2 g/dL — ABNORMAL LOW (ref 6.5–8.1)

## 2022-09-29 LAB — LIPASE, BLOOD: Lipase: 54 U/L — ABNORMAL HIGH (ref 11–51)

## 2022-09-29 MED ORDER — METRONIDAZOLE 500 MG PO TABS: 500.0000 mg | ORAL_TABLET | Freq: Two times a day (BID) | ORAL | 0 refills | Status: DC

## 2022-09-29 MED ORDER — CEFDINIR 300 MG PO CAPS: 300.0000 mg | ORAL_CAPSULE | Freq: Two times a day (BID) | ORAL | 0 refills | Status: DC

## 2022-09-29 MED ORDER — CEFDINIR 300 MG PO CAPS
300.0000 mg | ORAL_CAPSULE | Freq: Once | ORAL | Status: AC
  Administered 2022-09-29: 300 mg via ORAL
  Filled 2022-09-29: qty 1

## 2022-09-29 MED ORDER — METRONIDAZOLE 500 MG PO TABS
500.0000 mg | ORAL_TABLET | Freq: Once | ORAL | Status: AC
  Administered 2022-09-29: 500 mg via ORAL
  Filled 2022-09-29: qty 1

## 2022-09-29 NOTE — ED Triage Notes (Signed)
Pt endorses abd pain for quite awhile and constipation. Also having pain to left hip that goes down her leg to knee. Denies n/v.

## 2022-09-29 NOTE — ED Provider Triage Note (Signed)
Emergency Medicine Provider Triage Evaluation Note  Rachel Black , a 72 y.o. female  was evaluated in triage.  Pt complains of left hip pain that started a week ago with pain and paresthesias down left lower extremity. No fall or trauma. No previous hip injury or replacement. Slight lower back pain. No history of back pain or sciatica. No focal weakness. Also c/o generalized abdominal pain and constipation. Reports last bowel movement 4 hours ago. No nausea, vomiting, fever, chills, shortness of breath, or chest pain.   Review of Systems  Positive: See HPI Negative: See HPI  Physical Exam  BP 130/71   Pulse 70   Temp 98.5 F (36.9 C) (Oral)   Resp 18   Ht '5\' 6"'$  (1.676 m)   Wt 83 kg   SpO2 99%   BMI 29.53 kg/m  Gen:   Awake, no distress   Resp:  Normal effort LCTA MSK:   Moves extremities without difficulty  Other:  5/5 strength to bilateral UE and LE, CNI, alert and oriented, mild left lumbar paraspinal tenderness, mild left lateral hip tenderness, range of motion appears grossly intact  Medical Decision Making  Medically screening exam initiated at 4:05 PM.  Appropriate orders placed.  [name] was informed that the remainder of the evaluation will be completed by another provider, this initial triage assessment does not replace that evaluation, and the importance of remaining in the ED until their evaluation is complete.     Suzzette Righter, PA-C 09/29/22 1619

## 2022-09-29 NOTE — ED Provider Notes (Signed)
[facility] EMERGENCY DEPARTMENT Provider Note   CSN: [id number] Arrival date & time: 09/29/22  1520     History  Chief Complaint  Patient presents with   Abdominal Pain   Hip Pain    Rachel Black is a 72 y.o. female.   Abdominal Pain Hip Pain Associated symptoms include abdominal pain.  Patient presents with left upper abdominal pain and left hip pain.  Is had for around a week.  Has had urinary frequency but no dysuria.  Did have previous somewhat chronic abdominal pain.  Has had colitis in the past 2.  Has had both knees replaced.  Has had some arthritis in the left hip but usually not causing her much problem overall.  No fall.  Worse with her movements.  States she has not been sleeping well due to having to get up to urinate.  Has had some constipation also.  Has been taking prunes to have bowel movements.    Past Medical History:  Diagnosis Date   Arthritis    PAIN AND OA BOTH KNEES AND SHOULDERS AND ELBOWS AND WRIST   Blood transfusion without reported diagnosis 2018   after cholecystectomy   Broken foot, right, closed, initial encounter 09/15/2021   no surgery needed, fell at home and went to ED   Diabetes mellitus    ORAL MEDICATION   GERD (gastroesophageal reflux disease)    Gout    NO RECENT FLARE UPS   H/O: rheumatic fever    AS A CHILD - NO KNOWN HEART MURMUR OR HEART PROBLEMS   Heart attack ([place])    Hyperlipidemia    Hypertension    PFO (patent foramen ovale)    Stroke ([place]) 03/2017    Home Medications Prior to Admission medications   Medication Sig Start Date End Date Taking? Authorizing Provider  cefdinir (OMNICEF) 300 MG capsule Take 1 capsule (300 mg total) by mouth 2 (two) times daily. 09/29/22  Yes Davonna Belling, MD  metroNIDAZOLE (FLAGYL) 500 MG tablet Take 1 tablet (500 mg total) by mouth 2 (two) times daily. 09/29/22  Yes Davonna Belling, MD  ACCU-CHEK AVIVA PLUS test strip USE AS INSTRUCTED ONCE A DAY. DX CODE E11.9  07/20/22   Lorrene Reid, PA-C  Accu-Chek Softclix Lancets lancets Use as instructed once a day.  DX Code: E11.9 03/24/22   Lorrene Reid, PA-C  allopurinol (ZYLOPRIM) 300 MG tablet TAKE 1 TABLET BY MOUTH TWICE A DAY 08/27/22   Ronnell Freshwater, NP  ALPRAZolam (XANAX) 0.25 MG tablet Take 1 tablet (0.25 mg total) by mouth 2 (two) times daily as needed for anxiety. 03/19/22   Esterwood, [name] S, PA-C  aspirin 81 MG EC tablet Take 1 tablet (81 mg total) by mouth daily. 10/18/17   Cheryln Manly, NP  Calcium Carb-Cholecalciferol (CALCIUM 600-D PO) Take 1 tablet by mouth 2 (two) times daily before a meal.    [provider]  dexlansoprazole (DEXILANT) 60 MG capsule Take 1 capsule (60 mg total) by mouth daily. 03/19/22   Esterwood, [name] S, PA-C  Emollient (UDDERLY SMOOTH) CREA Apply 1 application topically See admin instructions. Apply topically to skin folds as needed for rash/irritation    [provider]  fenofibrate 160 MG tablet Take 1 tablet (160 mg total) by mouth daily. 09/10/22   Ronnell Freshwater, NP  ferrous sulfate 325 (65 FE) MG tablet TAKE 1 TABLET BY MOUTH TWICE A DAY 08/03/22   Zehr, Janett Billow D, PA-C  isosorbide mononitrate (IMDUR)  60 MG 24 hr tablet Take 1 tablet (60 mg total) by mouth daily. 03/09/22   Minus Breeding, MD  linaclotide Rolan Lipa) 72 MCG capsule Take 1 capsule (72 mcg total) by mouth daily before breakfast. 09/28/22   Boscia, Greer Ee, NP  olmesartan (BENICAR) 5 MG tablet TAKE 1 TABLET (5 MG TOTAL) BY MOUTH DAILY. 07/12/22   Lorrene Reid, PA-C  ondansetron (ZOFRAN) 4 MG tablet Take 1 tablet (4 mg total) by mouth every 8 (eight) hours as needed for nausea or vomiting. 02/27/22   Tegeler, Gwenyth Allegra, MD  pantoprazole (PROTONIX) 40 MG tablet Take 1 tablet (40 mg total) by mouth daily. 04/10/22   Irene Shipper, MD  rosuvastatin (CRESTOR) 40 MG tablet Take 1 tablet (40 mg total) by mouth daily. 03/24/22   Minus Breeding, MD  triamcinolone (KENALOG) 0.025 % cream  Apply 1 Application topically 2 (two) times daily. 09/28/22   Ronnell Freshwater, NP  Vitamin D, Ergocalciferol, (DRISDOL) 1.25 MG (50000 UNIT) CAPS capsule TAKE 1 CAPSULE BY MOUTH ONE TIME PER WEEK 07/12/22   Abonza, Maritza, PA-C      Allergies    Other and Penicillins    Review of Systems   Review of Systems  Gastrointestinal:  Positive for abdominal pain.    Physical Exam Updated Vital Signs BP 119/71   Pulse 67   Temp 97.9 F (36.6 C)   Resp 16   Ht '5\' 6"'$  (1.676 m)   Wt 83 kg   SpO2 100%   BMI 29.53 kg/m  Physical Exam Vitals and nursing note reviewed.  Cardiovascular:     Rate and Rhythm: Normal rate.  Pulmonary:     Breath sounds: Normal breath sounds.  Abdominal:     Tenderness: There is abdominal tenderness.     Comments: Left lower abdominal tenderness.  No rebound or guarding.  No hernia palpated.  Skin:    General: Skin is warm.  Neurological:     Mental Status: She is alert.   Mild pain with movement of left hip.  No deformity.  Good range of motion.  No rash.  No lumbar tenderness.  Left knee mildly loose but no tenderness.  ED Results / Procedures / Treatments   Labs (all labs ordered are listed, but only abnormal results are displayed) Labs Reviewed  CBC WITH DIFFERENTIAL/PLATELET - Abnormal; Notable for the following components:      Result Value   RBC 3.86 (*)    All other components within normal limits  COMPREHENSIVE METABOLIC PANEL - Abnormal; Notable for the following components:   Creatinine, Ser 1.14 (*)    Total Protein 6.2 (*)    GFR, Estimated 52 (*)    All other components within normal limits  LIPASE, BLOOD - Abnormal; Notable for the following components:   Lipase 54 (*)    All other components within normal limits  URINALYSIS, ROUTINE W REFLEX MICROSCOPIC - Abnormal; Notable for the following components:   APPearance HAZY (*)    Nitrite POSITIVE (*)    Leukocytes,Ua LARGE (*)    WBC, UA >50 (*)    Bacteria, UA MANY (*)    All  other components within normal limits    EKG EKG Interpretation  Date/Time:  Tuesday September 29 2022 15:59:29 EST Ventricular Rate:  81 PR Interval:  168 QRS Duration: 96 QT Interval:  364 QTC Calculation: 422 R Axis:   -78 Text Interpretation: Normal sinus rhythm Left anterior fascicular block Cannot rule out Anterior infarct ,  age undetermined Abnormal ECG When compared with ECG of 07-Apr-2022 12:41, No significant change since last tracing Confirmed by Davonna Belling [phone]) on 09/29/2022 9:36:06 PM  Radiology DG Hip Unilat W or Wo Pelvis 2-3 Views Left  Result Date: 09/29/2022 CLINICAL DATA:  Hip pain for quite a while. EXAM: DG HIP (WITH OR WITHOUT PELVIS) 2-3V LEFT COMPARISON:  None Available. FINDINGS: Degenerative changes in the left hip with joint space narrowing and osteophyte formation. No evidence of acute fracture or dislocation. No focal bone lesion or bone destruction. Vascular calcifications. IMPRESSION: Moderate degenerative changes in the left hip. No acute bony abnormalities. Electronically Signed   By: Lucienne Capers M.D.   On: 09/29/2022 21:32   DG Pelvis 1-2 Views  Result Date: 09/29/2022 CLINICAL DATA:  Low back and LEFT hip pain, no trauma EXAM: PELVIS - 1-2 VIEW COMPARISON:  None Available. FINDINGS: Marked osseous demineralization. Hip and SI joint spaces preserved. No acute fracture, dislocation, or bone destruction. Scattered mild atherosclerotic calcifications. IMPRESSION: Marked osseous demineralization. No acute abnormalities. Electronically Signed   By: Lavonia Dana M.D.   On: 09/29/2022 17:01   DG Lumbar Spine Complete  Result Date: 09/29/2022 CLINICAL DATA:  Back and LEFT hip pain EXAM: LUMBAR SPINE - COMPLETE 4+ VIEW COMPARISON:  None Available. FINDINGS: Five non-rib-bearing lumbar vertebra. Marked osseous demineralization. Vertebral body heights maintained. Disc space narrowing L3-L4 and L4-L5 with grade 1 anterolisthesis at both levels. No fracture,  additional subluxation, bone destruction or spondylolysis. Facet degenerative changes L4-L5 and L5-S1. SI joints preserved. IMPRESSION: Degenerative disc and facet disease changes lumbar spine with minimal grade 1 anterolisthesis at L3-L4 and L4-L5. Marked osseous demineralization. No acute abnormalities. Electronically Signed   By: Lavonia Dana M.D.   On: 09/29/2022 17:01    Procedures Procedures    Medications Ordered in ED Medications  cefdinir (OMNICEF) capsule 300 mg (has no administration in time range)  metroNIDAZOLE (FLAGYL) tablet 500 mg (has no administration in time range)    ED Course/ Medical Decision Making/ A&P                             Medical Decision Making Amount and/or Complexity of Data Reviewed Radiology: ordered.  Risk Prescription drug management.   Patient with left hip pain.  Differential diagnosis includes hip pain, fractures, arthritis.  X-ray reassuring.  Does have some mild lumbar disease and does have some degenerative changes of left hip.  I think this is most likely the cause of the pain.  No rash. Also abdominal pain.  White count reassuring.  Urine does show apparent UTI.  Has had urinary frequency.  Reviewed notes from previous visits.  Reviewed CT scans.  Has had previous colitis but also had negative scans after that.  With the tenderness but reassuring labs I think it is reasonable to treat for UTI and colitis.  Discussed with pharmacist and with colitis and UTI we have decided on Omnicef and Flagyl.  Urine culture also sent.  Will give first dose here.  Patient states she does not need pain medicines.  Will discharge home.        Final Clinical Impression(s) / ED Diagnoses Final diagnoses:  Left lower quadrant abdominal pain  Acute cystitis without hematuria  Pain of left hip    Rx / DC Orders ED Discharge Orders          Ordered    cefdinir (OMNICEF) 300 MG capsule  2  times daily        09/29/22 2142    metroNIDAZOLE (FLAGYL) 500  MG tablet  2 times daily        09/29/22 2142              Davonna Belling, MD 09/29/22 2152

## 2022-09-29 NOTE — Discharge Instructions (Addendum)
You are being treated with antibiotics to cover both urinary tract infection and if her colon is inflamed again.  Follow-up with your doctor as needed.  Try and keep yourself hydrated.

## 2022-10-01 ENCOUNTER — Emergency Department (HOSPITAL_COMMUNITY)
Admission: EM | Admit: 2022-10-01 | Discharge: 2022-10-02 | Disposition: A | Discharge status: Home / Self Care | Payer: BC Managed Care – PPO | Source: Home / Self Care | Attending: Emergency Medicine | Admitting: Emergency Medicine

## 2022-10-01 ENCOUNTER — Telehealth: Payer: Self-pay | Admitting: *Deleted

## 2022-10-01 ENCOUNTER — Encounter (HOSPITAL_COMMUNITY): Payer: Self-pay | Admitting: Emergency Medicine

## 2022-10-01 ENCOUNTER — Emergency Department (HOSPITAL_COMMUNITY): Payer: BC Managed Care – PPO

## 2022-10-01 ENCOUNTER — Other Ambulatory Visit: Payer: Self-pay

## 2022-10-01 DIAGNOSIS — K5732 Diverticulitis of large intestine without perforation or abscess without bleeding: Secondary | ICD-10-CM | POA: Diagnosis not present

## 2022-10-01 DIAGNOSIS — E119 Type 2 diabetes mellitus without complications: Secondary | ICD-10-CM | POA: Insufficient documentation

## 2022-10-01 DIAGNOSIS — Z7982 Long term (current) use of aspirin: Secondary | ICD-10-CM | POA: Insufficient documentation

## 2022-10-01 DIAGNOSIS — M25562 Pain in left knee: Secondary | ICD-10-CM | POA: Insufficient documentation

## 2022-10-01 DIAGNOSIS — K5792 Diverticulitis of intestine, part unspecified, without perforation or abscess without bleeding: Secondary | ICD-10-CM | POA: Insufficient documentation

## 2022-10-01 DIAGNOSIS — I10 Essential (primary) hypertension: Secondary | ICD-10-CM | POA: Insufficient documentation

## 2022-10-01 DIAGNOSIS — M25561 Pain in right knee: Secondary | ICD-10-CM

## 2022-10-01 DIAGNOSIS — Z79899 Other long term (current) drug therapy: Secondary | ICD-10-CM | POA: Insufficient documentation

## 2022-10-01 LAB — CBC WITH DIFFERENTIAL/PLATELET
Abs Immature Granulocytes: 0.04 10*3/uL (ref 0.00–0.07)
Basophils Absolute: 0 10*3/uL (ref 0.0–0.1)
Basophils Relative: 1 %
Eosinophils Absolute: 0 10*3/uL (ref 0.0–0.5)
Eosinophils Relative: 0 %
HCT: 36.4 % (ref 36.0–46.0)
Hemoglobin: 12.3 g/dL (ref 12.0–15.0)
Immature Granulocytes: 1 %
Lymphocytes Relative: 34 %
Lymphs Abs: 2.2 10*3/uL (ref 0.7–4.0)
MCH: 32.2 pg (ref 26.0–34.0)
MCHC: 33.8 g/dL (ref 30.0–36.0)
MCV: 95.3 fL (ref 80.0–100.0)
Monocytes Absolute: 0.3 10*3/uL (ref 0.1–1.0)
Monocytes Relative: 5 %
Neutro Abs: 3.9 10*3/uL (ref 1.7–7.7)
Neutrophils Relative %: 59 %
Platelets: 194 10*3/uL (ref 150–400)
RBC: 3.82 MIL/uL — ABNORMAL LOW (ref 3.87–5.11)
RDW: 13.2 % (ref 11.5–15.5)
WBC: 6.5 10*3/uL (ref 4.0–10.5)
nRBC: 0 % (ref 0.0–0.2)

## 2022-10-01 LAB — COMPREHENSIVE METABOLIC PANEL
ALT: 17 U/L (ref 0–44)
AST: 24 U/L (ref 15–41)
Albumin: 3.9 g/dL (ref 3.5–5.0)
Alkaline Phosphatase: 45 U/L (ref 38–126)
Anion gap: 9 (ref 5–15)
BUN: 19 mg/dL (ref 8–23)
CO2: 23 mmol/L (ref 22–32)
Calcium: 9.9 mg/dL (ref 8.9–10.3)
Chloride: 107 mmol/L (ref 98–111)
Creatinine, Ser: 1.1 mg/dL — ABNORMAL HIGH (ref 0.44–1.00)
GFR, Estimated: 54 mL/min — ABNORMAL LOW (ref >60)
Glucose, Bld: 90 mg/dL (ref 70–99)
Potassium: 3.8 mmol/L (ref 3.5–5.1)
Sodium: 139 mmol/L (ref 135–145)
Total Bilirubin: 0.6 mg/dL (ref 0.3–1.2)
Total Protein: 6.2 g/dL — ABNORMAL LOW (ref 6.5–8.1)

## 2022-10-01 LAB — LIPASE, BLOOD: Lipase: 51 U/L (ref 11–51)

## 2022-10-01 MED ORDER — OXYCODONE-ACETAMINOPHEN 5-325 MG PO TABS
1.0000 | ORAL_TABLET | Freq: Once | ORAL | Status: AC
  Administered 2022-10-02: 1 via ORAL
  Filled 2022-10-01: qty 1

## 2022-10-01 NOTE — ED Provider Triage Note (Signed)
Emergency Medicine Provider Triage Evaluation Note  Rachel Black , a 72 y.o. female  was evaluated in triage.  Pt complains of bilateral knee pain left greater than right for the past week. No trauma or fevers. She reports taht she still ahs LLQ pain but feels it has improved a little. She had an episode of non bloody non melantic watery stool this AM. Denies any fevers, nausea, vomiting, or urinary symptoms.   Review of Systems  Positive:  Negative:   Physical Exam  BP (!) 132/90 (BP Location: Right Arm)   Pulse 65   Temp 99.1 F (37.3 C)   Resp 16   SpO2 100%  Gen:   Awake, no distress   Resp:  Normal effort  MSK:   Moves extremities without difficulty  Other:  Old surgical incisions seen overlying bilateral knees. No overlying warmth or erythema. Compartmetns are soft. Palpable pulses. Abdomen is soft with mild LLQ tenderness to palpation  Medical Decision Making  Medically screening exam initiated at 5:06 PM.  Appropriate orders placed.  [name] was informed that the remainder of the evaluation will be completed by another provider, this initial triage assessment does not replace that evaluation, and the importance of remaining in the ED until their evaluation is complete.  Consutled with attending. Will order labs and defer any abdominal imaging at this time.    Sherrell Puller, PA-C 10/01/22 1719

## 2022-10-01 NOTE — Telephone Encounter (Signed)
Pt husband calling wanting to see if maybe provider could give some recommendations for pt to help with stomach, knee, side pain.  Was seen in office on 09/28/22 and ED on 09/29/22.  Pt was told she has UTI and given medication.  Informed them that it would take about 3-5 days to probably start feeling better.  Please advise if you think there is anything she can do to help her feel better.

## 2022-10-01 NOTE — ED Triage Notes (Signed)
Pt c/o full body pain. Pt c/o bilateral knee pain that is worse on the left for approx 1 month. Denies injury. Hx of knee replacement. Pt also c/o abdominal pain for a couple months. States she was seen at er and GI with polyps removed but pain is persisting.

## 2022-10-02 ENCOUNTER — Emergency Department (HOSPITAL_COMMUNITY): Payer: BC Managed Care – PPO

## 2022-10-02 LAB — URINALYSIS, ROUTINE W REFLEX MICROSCOPIC
Bacteria, UA: NONE SEEN
Bilirubin Urine: NEGATIVE
Glucose, UA: NEGATIVE mg/dL
Hgb urine dipstick: NEGATIVE
Ketones, ur: NEGATIVE mg/dL
Nitrite: NEGATIVE
Protein, ur: NEGATIVE mg/dL
Specific Gravity, Urine: 1.014 (ref 1.005–1.030)
pH: 5 (ref 5.0–8.0)

## 2022-10-02 MED ORDER — OXYCODONE-ACETAMINOPHEN 5-325 MG PO TABS: 1.0000 | ORAL_TABLET | Freq: Three times a day (TID) | ORAL | 0 refills | Status: DC | PRN

## 2022-10-02 MED ORDER — ONDANSETRON HCL 4 MG PO TABS: 4.0000 mg | ORAL_TABLET | Freq: Four times a day (QID) | ORAL | 0 refills | Status: DC

## 2022-10-02 MED ORDER — IOHEXOL 350 MG/ML SOLN
75.0000 mL | Freq: Once | INTRAVENOUS | Status: AC | PRN
  Administered 2022-10-02: 75 mL via INTRAVENOUS

## 2022-10-02 NOTE — Telephone Encounter (Signed)
Looks like she went back to ER 10/01/2022 for the abdominal pain again. Urine sample was improved. I recommended that she see her GI provider if abdominal pain was persistent. This has been persistent for years. I think we also talked about seeing orthopedics for her knee.

## 2022-10-02 NOTE — ED Provider Notes (Signed)
[facility] EMERGENCY DEPARTMENT Provider Note   CSN: [id number] Arrival date & time: 10/01/22  1657     History  Chief Complaint  Patient presents with   Knee Pain   Abdominal Pain    Rachel Black is a 72 y.o. female.  HPI   Patient with medical history including diabetes, hyperlipidemia, hypertension presents with complaints of abdominal pain and knee pain.  Patient states abdominal pain was going on for last 6 weeks, states pain is generalized, pain is constant but will slightly get worse at nighttime, she has no associated nausea or vomiting, states she is passing gas having normal bowel movements last bowel movement was today.  She denies any melena or bloody stools, she denies any urinary symptoms.  Abdominal surgeries include ventral hernia repair, hysterectomy, cholecystectomy, she has no history of bowel obstruction.  States that went to the ER 2 days ago for similar presentation was given antibiotics.  She also notes that she is having bilateral knee pain knee pain is gone for about 7 weeks as well, pain is intermittent, is mostly worsened with movement, her left knee hurts more than the right knee there is no recent trauma she denies any calf tenderness or leg swelling.  No history of PEs or DVTs.  Reviewed patient's chart was seen 2 days ago, she had benign workup, has a history of colitis concerned that she might have another flareup of colitis, she was discharged home on Flagyl and Omnicef at as there was questionable UTI-like symptoms.  Home Medications Prior to Admission medications   Medication Sig Start Date End Date Taking? Authorizing Provider  ondansetron (ZOFRAN) 4 MG tablet Take 1 tablet (4 mg total) by mouth every 6 (six) hours. 10/02/22  Yes Marcello Fennel, PA-C  ACCU-CHEK AVIVA PLUS test strip USE AS INSTRUCTED ONCE A DAY. DX CODE E11.9 07/20/22   Lorrene Reid, PA-C  Accu-Chek Softclix Lancets lancets Use as instructed once a day.  DX  Code: E11.9 03/24/22   Lorrene Reid, PA-C  allopurinol (ZYLOPRIM) 300 MG tablet TAKE 1 TABLET BY MOUTH TWICE A DAY 08/27/22   Ronnell Freshwater, NP  ALPRAZolam (XANAX) 0.25 MG tablet Take 1 tablet (0.25 mg total) by mouth 2 (two) times daily as needed for anxiety. 03/19/22   Esterwood, [name] S, PA-C  aspirin 81 MG EC tablet Take 1 tablet (81 mg total) by mouth daily. 10/18/17   Cheryln Manly, NP  Calcium Carb-Cholecalciferol (CALCIUM 600-D PO) Take 1 tablet by mouth 2 (two) times daily before a meal.    [provider]  cefdinir (OMNICEF) 300 MG capsule Take 1 capsule (300 mg total) by mouth 2 (two) times daily. 09/29/22   Davonna Belling, MD  dexlansoprazole (DEXILANT) 60 MG capsule Take 1 capsule (60 mg total) by mouth daily. 03/19/22   Esterwood, [name] S, PA-C  Emollient (UDDERLY SMOOTH) CREA Apply 1 application topically See admin instructions. Apply topically to skin folds as needed for rash/irritation    [provider]  fenofibrate 160 MG tablet Take 1 tablet (160 mg total) by mouth daily. 09/10/22   Ronnell Freshwater, NP  ferrous sulfate 325 (65 FE) MG tablet TAKE 1 TABLET BY MOUTH TWICE A DAY 08/03/22   Zehr, Janett Billow D, PA-C  isosorbide mononitrate (IMDUR) 60 MG 24 hr tablet Take 1 tablet (60 mg total) by mouth daily. 03/09/22   Minus Breeding, MD  linaclotide (LINZESS) 72 MCG capsule Take 1 capsule (72 mcg total) by mouth  daily before breakfast. 09/28/22   Ronnell Freshwater, NP  metroNIDAZOLE (FLAGYL) 500 MG tablet Take 1 tablet (500 mg total) by mouth 2 (two) times daily. 09/29/22   Davonna Belling, MD  olmesartan (BENICAR) 5 MG tablet TAKE 1 TABLET (5 MG TOTAL) BY MOUTH DAILY. 07/12/22   Lorrene Reid, PA-C  pantoprazole (PROTONIX) 40 MG tablet Take 1 tablet (40 mg total) by mouth daily. 04/10/22   Irene Shipper, MD  rosuvastatin (CRESTOR) 40 MG tablet Take 1 tablet (40 mg total) by mouth daily. 03/24/22   Minus Breeding, MD  triamcinolone (KENALOG) 0.025 % cream Apply 1  Application topically 2 (two) times daily. 09/28/22   Ronnell Freshwater, NP  Vitamin D, Ergocalciferol, (DRISDOL) 1.25 MG (50000 UNIT) CAPS capsule TAKE 1 CAPSULE BY MOUTH ONE TIME PER WEEK 07/12/22   Abonza, Maritza, PA-C      Allergies    Other and Penicillins    Review of Systems   Review of Systems  Constitutional:  Negative for chills and fever.  Respiratory:  Negative for shortness of breath.   Cardiovascular:  Negative for chest pain.  Gastrointestinal:  Positive for abdominal pain. Negative for diarrhea and vomiting.  Musculoskeletal:        Knee pain   Neurological:  Negative for headaches.    Physical Exam Updated Vital Signs BP (!) 121/95   Pulse (!) 51   Temp 98.9 F (37.2 C) (Oral)   Resp 12   Ht '5\' 6"'$  (1.676 m)   Wt 82.6 kg   SpO2 99%   BMI 29.38 kg/m  Physical Exam Vitals and nursing note reviewed.  Constitutional:      General: She is not in acute distress.    Appearance: She is not ill-appearing.  HENT:     Head: Normocephalic and atraumatic.     Nose: No congestion.  Eyes:     Conjunctiva/sclera: Conjunctivae normal.  Cardiovascular:     Rate and Rhythm: Normal rate and regular rhythm.     Pulses: Normal pulses.     Heart sounds: No murmur heard.    No friction rub. No gallop.  Pulmonary:     Effort: No respiratory distress.     Breath sounds: No wheezing, rhonchi or rales.  Abdominal:     Palpations: Abdomen is soft.     Tenderness: There is abdominal tenderness. There is no right CVA tenderness or left CVA tenderness.     Comments: Abdomen nondistended, soft, she is minimal tenderness noted in the periumbilical region, there is no guarding rebound has or peritoneal sign.  Musculoskeletal:     Right lower leg: No edema.     Left lower leg: No edema.     Comments: Focused exam of the lower extremities were unremarkable, she has surgical scars from previous knee replacements, there is no unilateral leg swelling no calf tenderness no palpable  cords, she has 2+ dorsal pedal pulses, sensation intact to light touch two second capillary refill, they are nontender to palpation.  She has full range of motion in her toes ankle and knees.  Skin:    General: Skin is warm and dry.  Neurological:     Mental Status: She is alert.  Psychiatric:        Mood and Affect: Mood normal.     ED Results / Procedures / Treatments   Labs (all labs ordered are listed, but only abnormal results are displayed) Labs Reviewed  CBC WITH DIFFERENTIAL/PLATELET - Abnormal; Notable for the  following components:      Result Value   RBC 3.82 (*)    All other components within normal limits  COMPREHENSIVE METABOLIC PANEL - Abnormal; Notable for the following components:   Creatinine, Ser 1.10 (*)    Total Protein 6.2 (*)    GFR, Estimated 54 (*)    All other components within normal limits  URINALYSIS, ROUTINE W REFLEX MICROSCOPIC - Abnormal; Notable for the following components:   Leukocytes,Ua TRACE (*)    All other components within normal limits  LIPASE, BLOOD    EKG None  Radiology CT ABDOMEN PELVIS W CONTRAST  Result Date: 10/02/2022 CLINICAL DATA:  Abdominal pain, acute, nonlocalized. EXAM: CT ABDOMEN AND PELVIS WITH CONTRAST TECHNIQUE: Multidetector CT imaging of the abdomen and pelvis was performed using the standard protocol following bolus administration of intravenous contrast. RADIATION DOSE REDUCTION: This exam was performed according to the departmental dose-optimization program which includes automated exposure control, adjustment of the mA and/or kV according to patient size and/or use of iterative reconstruction technique. CONTRAST:  8m OMNIPAQUE IOHEXOL 350 MG/ML SOLN COMPARISON:  03/16/2022. FINDINGS: Lower chest: No acute abnormality. Hepatobiliary: No focal liver abnormality is seen. Status post cholecystectomy. No biliary dilatation. Pneumobilia is noted. Pancreas: Unremarkable. No pancreatic ductal dilatation or surrounding  inflammatory changes. Spleen: Spleen is mildly enlarged measuring 14.0 cm. Adrenals/Urinary Tract: No adrenal nodule or mass. The kidneys enhance symmetrically. There is a cyst in the mid left kidney. No renal calculus or hydronephrosis. The bladder is unremarkable. Stomach/Bowel: There is a small hiatal hernia. Stomach is within normal limits. Appendix appears normal. No evidence of bowel wall thickening, distention, or inflammatory changes. No free air or pneumatosis. Colonic diverticulosis is noted. Subtle fat stranding is noted at the sigmoid colon in the left lower quadrant, possible mild or early diverticulitis. Vascular/Lymphatic: No significant vascular findings are present. No enlarged abdominal or pelvic lymph nodes. Reproductive: Status post hysterectomy. No adnexal masses. Other: No abdominopelvic ascites. Musculoskeletal: Degenerative changes are present in the thoracolumbar spine. No acute osseous abnormality. IMPRESSION: 1. A sigmoid diverticulosis with subtle fat stranding in the left lower quadrant, possible mild or early diverticulitis. 2. Small hiatal hernia. 3. Mild splenomegaly. Electronically Signed   By: LBrett FairyM.D.   On: 10/02/2022 01:18   DG Knee Complete 4 Views Right  Result Date: 10/01/2022 CLINICAL DATA:  pain EXAM: RIGHT KNEE - COMPLETE 4+ VIEW COMPARISON:  None Available. FINDINGS: Status post total knee arthroplasty. No radiographic findings suggest surgical hardware complication. No evidence of fracture, dislocation, or joint effusion. No evidence of arthropathy or other focal bone abnormality. Soft tissues are unremarkable. IMPRESSION: No acute displaced fracture or dislocation. Status post total knee arthroplasty. No radiographic findings suggest surgical hardware complication. Electronically Signed   By: MIven FinnM.D.   On: 10/01/2022 18:16   DG Knee Complete 4 Views Left  Result Date: 10/01/2022 CLINICAL DATA:  Pain.  Worsening over the last month EXAM: LEFT  KNEE - COMPLETE 4 VIEW COMPARISON:  Two-view x-ray 10/06/2017 FINDINGS: Osteopenia. No fracture or dislocation. Total knee arthroplasty seen with cemented tibial component Press-Fit femoral component. Patellar button. Expected alignment. No hardware failure. No joint effusion. IMPRESSION: Total knee arthroplasty.  Osteopenia.  No acute osseous abnormality Electronically Signed   By: AJill SideM.D.   On: 10/01/2022 18:13    Procedures Procedures    Medications Ordered in ED Medications  oxyCODONE-acetaminophen (PERCOCET/ROXICET) 5-325 MG per tablet 1 tablet (1 tablet Oral Given 10/02/22 0019)  iohexol (OMNIPAQUE) 350 MG/ML injection 75 mL (75 mLs Intravenous Contrast Given 10/02/22 0109)    ED Course/ Medical Decision Making/ A&P                             Medical Decision Making Amount and/or Complexity of Data Reviewed Radiology: ordered.  Risk Prescription drug management.   This patient presents to the ED for concern of knee pain abdominal pain, this involves an extensive number of treatment options, and is a complaint that carries with it a high risk of complications and morbidity.  The differential diagnosis includes fracture, dislocation, compartment syndrome, bowel obstruction, diverticulitis,    Additional history obtained:  Additional history obtained from husband at bedside External records from outside source obtained and reviewed including previous ER notes   Co morbidities that complicate the patient evaluation  Diabetes  Social Determinants of Health:  N/A    Lab Tests:  I Ordered, and personally interpreted labs.  The pertinent results include: CBC is unremarkable, CMP shows creatinine of 1.1, GFR 54, lipase 51   Imaging Studies ordered:  I ordered imaging studies including x-ray of the left and right knee, CT abdomen pelvis I independently visualized and interpreted imaging which showed x-ray is unremarkable for acute findings, CT imaging reveals  possible diverticulitis. I agree with the radiologist interpretation   Cardiac Monitoring:  The patient was maintained on a cardiac monitor.  I personally viewed and interpreted the cardiac monitored which showed an underlying rhythm of: N/A   Medicines ordered and prescription drug management:  I ordered medication including pain medication I have reviewed the patients home medicines and have made adjustments as needed  Critical Interventions:  N/A   Reevaluation:  Presents with abdominal pain and knee pain, triage obtain basic lab work imaging which I personally reviewed they are unremarkable, patient had minimal abdominal tenderness, she has had previous surgical history, as well as history of colitis, possibly has a flareup of diverticulitis, will obtain CT abdomen pelvis for further evaluation.  Will try her with oxycodone for pain management of her knees.  Reassessed the patient, updated her on her imaging, she is agreement with plan and discharge at this time.    Consultations Obtained:  N/A    Test Considered:  N/A    Rule out Suspicion for pyelo-, kidney stone is low at this time is no flank tenderness no side tenderness, CT imaging is negative.  I doubt patient is having worsening UTI after start antibiotics as comparison to her last UA she has only trace leukocytes no red blood cells white cells present.  Suspicion for appendicitis, volvulus, bowel obstruction, AAA, close this time as CT imaging is negative.  Suspicion for fracture or dislocation of the knee is low as x-ray is negative.  I doubt compartment syndrome as all compartments are soft neurovascularly intact.  I doubt DVT as she has no calf tenderness no palpable cords there is no unilateral leg swelling.    Dispostion and problem list  After consideration of the diagnostic results and the patients response to treatment, I feel that the patent would benefit from discharge.  Abdominal pain-suspect  likely secondary due to beginning of diverticulitis as well as resolving UTI.  Patient was placed on Omnicef as well as Flagyl, appears to be working on her UTI as her UA has significantly improved since 2 days ago, her abdominal pain is also improving, will not change this regimen at  this time. I will add on Zofran, recommend a bland diet given strict return precautions. Knee pain-acute on chronic, over-the-counter pain medications or not working, will provide the short course of narcotic medication, referred to her orthopedic for further evaluation.            Final Clinical Impression(s) / ED Diagnoses Final diagnoses:  Diverticulitis  Acute pain of both knees    Rx / DC Orders ED Discharge Orders          Ordered    ondansetron (ZOFRAN) 4 MG tablet  Every 6 hours        10/02/22 0155              Marcello Fennel, PA-C 10/02/22 0156    Maudie Flakes, MD 10/02/22 [phone]

## 2022-10-02 NOTE — ED Notes (Signed)
Patient provided withcrackers and ginger-ale. Tolerated well

## 2022-10-02 NOTE — Discharge Instructions (Addendum)
Diverticulitis-suspect this is the cause of your abdominal pain, I want you to continue with the antibiotics that are prescribed to you.  I have given you another medication called Zofran this will help with nausea take as prescribed.  I recommend a liquid diet for the next couple days, as your symptoms are improving you may advance to a soft diet and then following to a normal diet.  If you note that your symptoms are not improving despite antibiotics, you are unable to tolerate food or water and or your medications you must come back in for reassessment. Knee pain-likely arthritis changes, I have given you a short course of narcotic medication please take as prescribed.  Please follow-up with an orthopedic doctor for reassessment.  Come back to the emergency department if you develop chest pain, shortness of breath, severe abdominal pain, uncontrolled nausea, vomiting, diarrhea.

## 2022-10-03 ENCOUNTER — Observation Stay (HOSPITAL_COMMUNITY): Payer: BC Managed Care – PPO

## 2022-10-03 ENCOUNTER — Inpatient Hospital Stay (HOSPITAL_COMMUNITY)
Admission: EM | Admit: 2022-10-03 | Discharge: 2022-10-06 | DRG: 392 | Disposition: A | Discharge status: Home / Self Care | Payer: BC Managed Care – PPO | Attending: Internal Medicine | Admitting: Internal Medicine

## 2022-10-03 ENCOUNTER — Encounter (HOSPITAL_COMMUNITY): Payer: Self-pay | Admitting: Emergency Medicine

## 2022-10-03 ENCOUNTER — Other Ambulatory Visit: Payer: Self-pay

## 2022-10-03 DIAGNOSIS — M109 Gout, unspecified: Secondary | ICD-10-CM | POA: Diagnosis present

## 2022-10-03 DIAGNOSIS — E876 Hypokalemia: Secondary | ICD-10-CM | POA: Diagnosis present

## 2022-10-03 DIAGNOSIS — Z823 Family history of stroke: Secondary | ICD-10-CM

## 2022-10-03 DIAGNOSIS — Z833 Family history of diabetes mellitus: Secondary | ICD-10-CM

## 2022-10-03 DIAGNOSIS — Z881 Allergy status to other antibiotic agents status: Secondary | ICD-10-CM

## 2022-10-03 DIAGNOSIS — I129 Hypertensive chronic kidney disease with stage 1 through stage 4 chronic kidney disease, or unspecified chronic kidney disease: Secondary | ICD-10-CM | POA: Diagnosis present

## 2022-10-03 DIAGNOSIS — Z1152 Encounter for screening for COVID-19: Secondary | ICD-10-CM

## 2022-10-03 DIAGNOSIS — J101 Influenza due to other identified influenza virus with other respiratory manifestations: Secondary | ICD-10-CM | POA: Diagnosis present

## 2022-10-03 DIAGNOSIS — N179 Acute kidney failure, unspecified: Secondary | ICD-10-CM

## 2022-10-03 DIAGNOSIS — M25562 Pain in left knee: Secondary | ICD-10-CM | POA: Insufficient documentation

## 2022-10-03 DIAGNOSIS — I252 Old myocardial infarction: Secondary | ICD-10-CM

## 2022-10-03 DIAGNOSIS — E1169 Type 2 diabetes mellitus with other specified complication: Secondary | ICD-10-CM | POA: Diagnosis present

## 2022-10-03 DIAGNOSIS — E1159 Type 2 diabetes mellitus with other circulatory complications: Secondary | ICD-10-CM | POA: Diagnosis present

## 2022-10-03 DIAGNOSIS — K5792 Diverticulitis of intestine, part unspecified, without perforation or abscess without bleeding: Principal | ICD-10-CM | POA: Diagnosis present

## 2022-10-03 DIAGNOSIS — K5909 Other constipation: Secondary | ICD-10-CM | POA: Diagnosis present

## 2022-10-03 DIAGNOSIS — I10 Essential (primary) hypertension: Secondary | ICD-10-CM | POA: Diagnosis present

## 2022-10-03 DIAGNOSIS — Z7982 Long term (current) use of aspirin: Secondary | ICD-10-CM

## 2022-10-03 DIAGNOSIS — I2510 Atherosclerotic heart disease of native coronary artery without angina pectoris: Secondary | ICD-10-CM | POA: Diagnosis present

## 2022-10-03 DIAGNOSIS — Z96653 Presence of artificial knee joint, bilateral: Secondary | ICD-10-CM | POA: Diagnosis present

## 2022-10-03 DIAGNOSIS — Z83438 Family history of other disorder of lipoprotein metabolism and other lipidemia: Secondary | ICD-10-CM

## 2022-10-03 DIAGNOSIS — K5732 Diverticulitis of large intestine without perforation or abscess without bleeding: Principal | ICD-10-CM | POA: Diagnosis present

## 2022-10-03 DIAGNOSIS — Z95828 Presence of other vascular implants and grafts: Secondary | ICD-10-CM

## 2022-10-03 DIAGNOSIS — M25561 Pain in right knee: Secondary | ICD-10-CM | POA: Diagnosis present

## 2022-10-03 DIAGNOSIS — Z8673 Personal history of transient ischemic attack (TIA), and cerebral infarction without residual deficits: Secondary | ICD-10-CM

## 2022-10-03 DIAGNOSIS — M25552 Pain in left hip: Secondary | ICD-10-CM | POA: Insufficient documentation

## 2022-10-03 DIAGNOSIS — Z880 Allergy status to penicillin: Secondary | ICD-10-CM

## 2022-10-03 DIAGNOSIS — Z8249 Family history of ischemic heart disease and other diseases of the circulatory system: Secondary | ICD-10-CM

## 2022-10-03 DIAGNOSIS — K219 Gastro-esophageal reflux disease without esophagitis: Secondary | ICD-10-CM | POA: Diagnosis present

## 2022-10-03 DIAGNOSIS — E1122 Type 2 diabetes mellitus with diabetic chronic kidney disease: Secondary | ICD-10-CM | POA: Diagnosis present

## 2022-10-03 DIAGNOSIS — E782 Mixed hyperlipidemia: Secondary | ICD-10-CM | POA: Diagnosis present

## 2022-10-03 DIAGNOSIS — Z91048 Other nonmedicinal substance allergy status: Secondary | ICD-10-CM

## 2022-10-03 DIAGNOSIS — Z803 Family history of malignant neoplasm of breast: Secondary | ICD-10-CM

## 2022-10-03 DIAGNOSIS — Z79899 Other long term (current) drug therapy: Secondary | ICD-10-CM

## 2022-10-03 DIAGNOSIS — N1831 Chronic kidney disease, stage 3a: Secondary | ICD-10-CM | POA: Diagnosis present

## 2022-10-03 DIAGNOSIS — I152 Hypertension secondary to endocrine disorders: Secondary | ICD-10-CM | POA: Diagnosis present

## 2022-10-03 DIAGNOSIS — E785 Hyperlipidemia, unspecified: Secondary | ICD-10-CM | POA: Diagnosis present

## 2022-10-03 LAB — COMPREHENSIVE METABOLIC PANEL
ALT: 25 U/L (ref 0–44)
AST: 34 U/L (ref 15–41)
Albumin: 3.9 g/dL (ref 3.5–5.0)
Alkaline Phosphatase: 42 U/L (ref 38–126)
Anion gap: 6 (ref 5–15)
BUN: 17 mg/dL (ref 8–23)
CO2: 25 mmol/L (ref 22–32)
Calcium: 9.7 mg/dL (ref 8.9–10.3)
Chloride: 105 mmol/L (ref 98–111)
Creatinine, Ser: 1.33 mg/dL — ABNORMAL HIGH (ref 0.44–1.00)
GFR, Estimated: 43 mL/min — ABNORMAL LOW (ref >60)
Glucose, Bld: 97 mg/dL (ref 70–99)
Potassium: 3.6 mmol/L (ref 3.5–5.1)
Sodium: 136 mmol/L (ref 135–145)
Total Bilirubin: <0.1 mg/dL — ABNORMAL LOW (ref 0.3–1.2)
Total Protein: 6.1 g/dL — ABNORMAL LOW (ref 6.5–8.1)

## 2022-10-03 LAB — CBC WITH DIFFERENTIAL/PLATELET
Abs Immature Granulocytes: 0.03 10*3/uL (ref 0.00–0.07)
Basophils Absolute: 0 10*3/uL (ref 0.0–0.1)
Basophils Relative: 0 %
Eosinophils Absolute: 0 10*3/uL (ref 0.0–0.5)
Eosinophils Relative: 0 %
HCT: 36.6 % (ref 36.0–46.0)
Hemoglobin: 12.4 g/dL (ref 12.0–15.0)
Immature Granulocytes: 1 %
Lymphocytes Relative: 16 %
Lymphs Abs: 1 10*3/uL (ref 0.7–4.0)
MCH: 32.5 pg (ref 26.0–34.0)
MCHC: 33.9 g/dL (ref 30.0–36.0)
MCV: 96.1 fL (ref 80.0–100.0)
Monocytes Absolute: 0.4 10*3/uL (ref 0.1–1.0)
Monocytes Relative: 7 %
Neutro Abs: 4.6 10*3/uL (ref 1.7–7.7)
Neutrophils Relative %: 76 %
Platelets: 165 10*3/uL (ref 150–400)
RBC: 3.81 MIL/uL — ABNORMAL LOW (ref 3.87–5.11)
RDW: 13.2 % (ref 11.5–15.5)
WBC: 6 10*3/uL (ref 4.0–10.5)
nRBC: 0 % (ref 0.0–0.2)

## 2022-10-03 LAB — URINALYSIS, ROUTINE W REFLEX MICROSCOPIC
Bacteria, UA: NONE SEEN
Bilirubin Urine: NEGATIVE
Glucose, UA: NEGATIVE mg/dL
Hgb urine dipstick: NEGATIVE
Ketones, ur: NEGATIVE mg/dL
Nitrite: NEGATIVE
Protein, ur: NEGATIVE mg/dL
Specific Gravity, Urine: 1.019 (ref 1.005–1.030)
pH: 5 (ref 5.0–8.0)

## 2022-10-03 LAB — RESP PANEL BY RT-PCR (RSV, FLU A&B, COVID)  RVPGX2
Influenza A by PCR: NEGATIVE
Influenza B by PCR: POSITIVE — AB
Resp Syncytial Virus by PCR: NEGATIVE
SARS Coronavirus 2 by RT PCR: NEGATIVE

## 2022-10-03 MED ORDER — ENOXAPARIN SODIUM 40 MG/0.4ML IJ SOSY
40.0000 mg | PREFILLED_SYRINGE | INTRAMUSCULAR | Status: DC
  Administered 2022-10-04 – 2022-10-06 (×3): 40 mg via SUBCUTANEOUS
  Filled 2022-10-03 (×3): qty 0.4

## 2022-10-03 MED ORDER — ASPIRIN 81 MG PO TBEC
81.0000 mg | DELAYED_RELEASE_TABLET | Freq: Every day | ORAL | Status: DC
  Administered 2022-10-04 – 2022-10-06 (×3): 81 mg via ORAL
  Filled 2022-10-03 (×3): qty 1

## 2022-10-03 MED ORDER — SODIUM CHLORIDE 0.9 % IV SOLN: INTRAVENOUS | Status: DC

## 2022-10-03 MED ORDER — ISOSORBIDE MONONITRATE ER 30 MG PO TB24: 30.0000 mg | ORAL_TABLET | Freq: Every day | ORAL | Status: DC

## 2022-10-03 MED ORDER — OSELTAMIVIR PHOSPHATE 75 MG PO CAPS
75.0000 mg | ORAL_CAPSULE | Freq: Once | ORAL | Status: AC
  Administered 2022-10-03: 75 mg via ORAL
  Filled 2022-10-03: qty 1

## 2022-10-03 MED ORDER — IRBESARTAN 75 MG PO TABS
37.5000 mg | ORAL_TABLET | Freq: Every day | ORAL | Status: DC
  Filled 2022-10-03: qty 0.5

## 2022-10-03 MED ORDER — SODIUM CHLORIDE 0.9 % IV BOLUS
1000.0000 mL | Freq: Once | INTRAVENOUS | Status: AC
  Administered 2022-10-03: 1000 mL via INTRAVENOUS

## 2022-10-03 MED ORDER — MORPHINE SULFATE (PF) 2 MG/ML IV SOLN: 1.0000 mg | INTRAVENOUS | Status: DC | PRN

## 2022-10-03 MED ORDER — PIPERACILLIN-TAZOBACTAM 3.375 G IVPB 30 MIN
3.3750 g | Freq: Once | INTRAVENOUS | Status: AC
  Administered 2022-10-03: 3.375 g via INTRAVENOUS
  Filled 2022-10-03: qty 50

## 2022-10-03 MED ORDER — ACETAMINOPHEN 650 MG RE SUPP: 650.0000 mg | Freq: Four times a day (QID) | RECTAL | Status: DC | PRN

## 2022-10-03 MED ORDER — OSELTAMIVIR PHOSPHATE 30 MG PO CAPS
30.0000 mg | ORAL_CAPSULE | Freq: Two times a day (BID) | ORAL | Status: DC
  Administered 2022-10-04 – 2022-10-06 (×5): 30 mg via ORAL
  Filled 2022-10-03 (×6): qty 1

## 2022-10-03 MED ORDER — ALLOPURINOL 300 MG PO TABS
300.0000 mg | ORAL_TABLET | Freq: Two times a day (BID) | ORAL | Status: DC
  Administered 2022-10-04 – 2022-10-06 (×5): 300 mg via ORAL
  Filled 2022-10-03 (×5): qty 1

## 2022-10-03 MED ORDER — ROSUVASTATIN CALCIUM 20 MG PO TABS
40.0000 mg | ORAL_TABLET | Freq: Every day | ORAL | Status: DC
  Administered 2022-10-04 – 2022-10-06 (×3): 40 mg via ORAL
  Filled 2022-10-03 (×3): qty 2

## 2022-10-03 MED ORDER — FENOFIBRATE 160 MG PO TABS
160.0000 mg | ORAL_TABLET | Freq: Every day | ORAL | Status: DC
  Administered 2022-10-04 – 2022-10-06 (×3): 160 mg via ORAL
  Filled 2022-10-03 (×4): qty 1

## 2022-10-03 MED ORDER — MORPHINE SULFATE (PF) 4 MG/ML IV SOLN
4.0000 mg | Freq: Once | INTRAVENOUS | Status: AC
  Administered 2022-10-03: 4 mg via INTRAVENOUS
  Filled 2022-10-03: qty 1

## 2022-10-03 MED ORDER — ACETAMINOPHEN 325 MG PO TABS
650.0000 mg | ORAL_TABLET | Freq: Four times a day (QID) | ORAL | Status: DC | PRN
  Administered 2022-10-04 – 2022-10-06 (×3): 650 mg via ORAL
  Filled 2022-10-03 (×3): qty 2

## 2022-10-03 NOTE — H&P (Signed)
History and Physical    Rachel Black:[id number] DOB: 01/14/1951 DOA: 10/03/2022  PCP: Rachel Reid, PA-C  Patient coming from: Home  Chief Complaint: Abdominal pain  HPI: Rachel Black is a 72 y.o. female with medical history significant of CAD, hypertension, hyperlipidemia, PFO, stroke, type 2 diabetes, CKD stage IIIa, GERD, gout, bilateral knee replacement.  Recently seen in the ED on 1/16 for abdominal pain and left hip pain.  UA was concerning for infection.  X-rays of lumbar spine/pelvis/left hip negative for acute finding.  She was discharged on Omnicef and Flagyl for UTI and possible colitis.  No urine culture sent.  She returned to the ED on 1/18 complaining of ongoing abdominal pain and also bilateral knee pain. CT abdomen pelvis with contrast showing possible mild or early diverticulitis of the sigmoid colon and no complicating features such as perforation or abscess. Bilateral knee x-rays negative for acute finding.  She returns to the ED again today due to ongoing symptoms.  Vital signs stable.  Labs showing no leukocytosis, hemoglobin stable, creatinine 1.3 (at baseline), no elevation of LFTs, influenza B PCR positive.  UA showing negative nitrite, trace leukocytes, and microscopy showing 0-5 WBCs and no bacteria. Patient was given Zosyn, morphine, Tamiflu, and 1 L normal saline bolus.  Patient is reporting ongoing left lower quadrant abdominal pain for several days.  She denies nausea, vomiting, or fevers.  She eats prunes for chronic constipation.  Also coughing a lot for the past few days.  Denies shortness of breath or chest pain.  No other complaints.  Review of Systems:  Review of Systems  All other systems reviewed and are negative.   Past Medical History:  Diagnosis Date   Arthritis    PAIN AND OA BOTH KNEES AND SHOULDERS AND ELBOWS AND WRIST   Blood transfusion without reported diagnosis 2018   after cholecystectomy   Broken foot, right, closed, initial  encounter 09/15/2021   no surgery needed, fell at home and went to ED   Diabetes mellitus    ORAL MEDICATION   GERD (gastroesophageal reflux disease)    Gout    NO RECENT FLARE UPS   H/O: rheumatic fever    AS A CHILD - NO KNOWN HEART MURMUR OR HEART PROBLEMS   Heart attack ([place])    Hyperlipidemia    Hypertension    PFO (patent foramen ovale)    Stroke ([place]) 03/2017    Past Surgical History:  Procedure Laterality Date   ABDOMINAL HYSTERECTOMY     CESAREAN SECTION     CHOLECYSTECTOMY N/A 12/09/2016   Procedure: LAPAROSCOPIC CHOLECYSTECTOMY WITH INTRAOPERATIVE CHOLANGIOGRAM;  Surgeon: Stark Klein, MD;  Location: [facility];  Service: General;  Laterality: N/A;   COLONOSCOPY     COLONOSCOPY WITH PROPOFOL N/A 01/30/2013   Procedure: COLONOSCOPY WITH PROPOFOL;  Surgeon: Irene Shipper, MD;  Location: [facility];  Service: Endoscopy;  Laterality: N/A;   ERCP N/A 12/08/2016   Procedure: ENDOSCOPIC RETROGRADE CHOLANGIOPANCREATOGRAPHY (ERCP);  Surgeon: Ladene Artist, MD;  Location: [facility];  Service: Endoscopy;  Laterality: N/A;   ESOPHAGOGASTRODUODENOSCOPY (EGD) WITH PROPOFOL N/A 01/30/2013   Procedure: ESOPHAGOGASTRODUODENOSCOPY (EGD) WITH PROPOFOL;  Surgeon: Irene Shipper, MD;  Location: [facility];  Service: Endoscopy;  Laterality: N/A;   LEFT HEART CATH AND CORONARY ANGIOGRAPHY N/A 10/18/2017   Procedure: LEFT HEART CATH AND CORONARY ANGIOGRAPHY;  Surgeon: Martinique, Peter M, MD;  Location: [facility];  Service: Cardiovascular;  Laterality: N/A;   LOOP RECORDER INSERTION  N/A 03/18/2017   Procedure: Loop Recorder Insertion;  Surgeon: Constance Haw, MD;  Location: [facility];  Service: Cardiovascular;  Laterality: N/A;   PARTIAL HYSTERECTOMY     TEE WITHOUT CARDIOVERSION N/A 03/16/2017   Procedure: TRANSESOPHAGEAL ECHOCARDIOGRAM (TEE);  Surgeon: Acie Fredrickson Wonda Cheng, MD;  Location: [facility];  Service: Cardiovascular;  Laterality: N/A;   TOTAL KNEE ARTHROPLASTY  Right 04/20/2014   Procedure: RIGHT TOTAL KNEE ARTHROPLASTY CONVERTED TO RIGHT KNEE REIMPLANTATION;  Surgeon: Mcarthur Rossetti, MD;  Location: [facility];  Service: Orthopedics;  Laterality: Right;   TOTAL KNEE ARTHROPLASTY Left 08/23/2015   Procedure: LEFT TOTAL KNEE ARTHROPLASTY;  Surgeon: Mcarthur Rossetti, MD;  Location: [facility];  Service: Orthopedics;  Laterality: Left;   VENTRAL HERNIA REPAIR     x2     reports that she has never smoked. She has never used smokeless tobacco. She reports that she does not currently use alcohol. She reports that she does not use drugs.  Allergies  Allergen Reactions   Other Other (See Comments)    Some BANDAIDS cause skin irritation    Penicillins Itching    Has patient had a PCN reaction causing immediate rash, facial/tongue/throat swelling, SOB or lightheadedness with hypotension: No Has patient had a PCN reaction causing severe rash involving mucus membranes or skin necrosis: No Has patient had a PCN reaction that required hospitalization No Has patient had a PCN reaction occurring within the last 10 years: No If all of the above answers are "NO", then may proceed with Cephalosporin use.     Family History  Problem Relation Age of Onset   Diabetes Mother    Hypertension Mother        entire family   Breast cancer Mother        diagnosed in her 19's   Stroke Father        CVA   Hyperlipidemia Father        entire family   Diabetes Father    Heart disease Father        No details.  Not at an early age.     Crohn's disease Son    Irritable bowel syndrome Son    Colon cancer Neg Hx    Colon polyps Neg Hx    Esophageal cancer Neg Hx    Stomach cancer Neg Hx    Rectal cancer Neg Hx     Prior to Admission medications   Medication Sig Start Date End Date Taking? Authorizing Provider  allopurinol (ZYLOPRIM) 300 MG tablet TAKE 1 TABLET BY MOUTH TWICE A DAY 08/27/22  Yes Boscia, Heather E, NP  aspirin 81 MG EC tablet Take 1 tablet  (81 mg total) by mouth daily. 10/18/17  Yes Reino Bellis B, NP  CALCIUM PO Take 2 tablets by mouth daily.   Yes [provider]  cefdinir (OMNICEF) 300 MG capsule Take 1 capsule (300 mg total) by mouth 2 (two) times daily. 09/29/22  Yes Davonna Belling, MD  Emollient (UDDERLY SMOOTH) CREA Apply 1 application topically See admin instructions. Apply topically to skin folds as needed for rash/irritation   Yes [provider]  famotidine (PEPCID) 10 MG tablet Take 10 mg by mouth daily as needed for heartburn or indigestion.   Yes [provider]  fenofibrate 160 MG tablet Take 1 tablet (160 mg total) by mouth daily. 09/10/22  Yes Boscia, Heather E, NP  ferrous sulfate 325 (65 FE) MG tablet TAKE 1 TABLET BY MOUTH TWICE  A DAY Patient taking differently: Take 325 mg by mouth daily. 08/03/22  Yes Zehr, Laban Emperor, PA-C  isosorbide mononitrate (IMDUR) 60 MG 24 hr tablet Take 1 tablet (60 mg total) by mouth daily. Patient taking differently: Take 30 mg by mouth daily. 03/09/22  Yes Minus Breeding, MD  linaclotide Rolan Lipa) 72 MCG capsule Take 1 capsule (72 mcg total) by mouth daily before breakfast. 09/28/22  Yes Boscia, Heather E, NP  olmesartan (BENICAR) 5 MG tablet TAKE 1 TABLET (5 MG TOTAL) BY MOUTH DAILY. 07/12/22  Yes Abonza, Maritza, PA-C  rosuvastatin (CRESTOR) 40 MG tablet Take 1 tablet (40 mg total) by mouth daily. 03/24/22  Yes Minus Breeding, MD  triamcinolone (KENALOG) 0.025 % cream Apply 1 Application topically 2 (two) times daily. 09/28/22  Yes Boscia, Greer Ee, NP  ACCU-CHEK AVIVA PLUS test strip USE AS INSTRUCTED ONCE A DAY. DX CODE E11.9 07/20/22   Rachel Reid, PA-C  Accu-Chek Softclix Lancets lancets Use as instructed once a day.  DX Code: E11.9 03/24/22   Rachel Reid, PA-C  Cholecalciferol (VITAMIN D3 SUPER STRENGTH) 50 MCG (2000 UT) TABS Take 2,000 Units by mouth daily. Patient not taking: Reported on 10/03/2022    [provider]  metroNIDAZOLE  (FLAGYL) 500 MG tablet Take 1 tablet (500 mg total) by mouth 2 (two) times daily. 09/29/22   Davonna Belling, MD  ondansetron (ZOFRAN) 4 MG tablet Take 1 tablet (4 mg total) by mouth every 6 (six) hours. 10/02/22   Marcello Fennel, PA-C  oxyCODONE-acetaminophen (PERCOCET/ROXICET) 5-325 MG tablet Take 1 tablet by mouth every 8 (eight) hours as needed for up to 3 days for severe pain. Patient not taking: Reported on 10/03/2022 10/02/22 10/05/22  Marcello Fennel, PA-C  pantoprazole (PROTONIX) 40 MG tablet Take 1 tablet (40 mg total) by mouth daily. Patient not taking: Reported on 10/03/2022 04/10/22   Irene Shipper, MD  Vitamin D, Ergocalciferol, (DRISDOL) 1.25 MG (50000 UNIT) CAPS capsule TAKE 1 CAPSULE BY MOUTH ONE TIME PER WEEK Patient not taking: Reported on 10/03/2022 07/12/22   Rachel Reid, PA-C    Physical Exam: Vitals:   10/03/22 1915 10/03/22 1950 10/03/22 2000 10/03/22 2030  BP: 101/72  125/61 119/63  Pulse: 60  64 71  Resp: '14  14 17  '$ Temp:  98.7 F (37.1 C)    TempSrc:  Oral    SpO2: 98%  99% 98%    Physical Exam Vitals reviewed.  Constitutional:      General: She is not in acute distress. HENT:     Head: Normocephalic and atraumatic.  Eyes:     Extraocular Movements: Extraocular movements intact.  Cardiovascular:     Rate and Rhythm: Normal rate and regular rhythm.     Pulses: Normal pulses.  Pulmonary:     Effort: Pulmonary effort is normal. No respiratory distress.     Breath sounds: Normal breath sounds. No wheezing or rales.  Abdominal:     General: Bowel sounds are normal. There is no distension.     Palpations: Abdomen is soft.     Tenderness: There is abdominal tenderness. There is no guarding or rebound.     Comments: Left lower quadrant tender to palpation  Musculoskeletal:     Cervical back: Normal range of motion.     Right lower leg: No edema.     Left lower leg: No edema.  Skin:    General: Skin is warm and dry.  Neurological:     General:  No  focal deficit present.     Mental Status: She is alert and oriented to person, place, and time.     Labs on Admission: I have personally reviewed following labs and imaging studies  CBC: Recent Labs  Lab 09/29/22 1615 10/01/22 1718 10/03/22 1714  WBC 7.3 6.5 6.0  NEUTROABS 4.5 3.9 4.6  HGB 12.5 12.3 12.4  HCT 36.8 36.4 36.6  MCV 95.3 95.3 96.1  PLT 206 194 322   Basic Metabolic Panel: Recent Labs  Lab 09/29/22 1615 10/01/22 1718 10/03/22 1714  NA 138 139 136  K 4.4 3.8 3.6  CL 104 107 105  CO2 '24 23 25  '$ GLUCOSE 92 90 97  BUN '18 19 17  '$ CREATININE 1.14* 1.10* 1.33*  CALCIUM 9.9 9.9 9.7   GFR: Estimated Creatinine Clearance: 42.6 mL/min (A) (by C-G formula based on SCr of 1.33 mg/dL (H)). Liver Function Tests: Recent Labs  Lab 09/29/22 1615 10/01/22 1718 10/03/22 1714  AST 21 24 34  ALT '18 17 25  '$ ALKPHOS 44 45 42  BILITOT 0.4 0.6 <0.1*  PROT 6.2* 6.2* 6.1*  ALBUMIN 3.9 3.9 3.9   Recent Labs  Lab 09/29/22 1615 10/01/22 1718  LIPASE 54* 51   No results for input(s): "AMMONIA" in the last 168 hours. Coagulation Profile: No results for input(s): "INR", "PROTIME" in the last 168 hours. Cardiac Enzymes: No results for input(s): "CKTOTAL", "CKMB", "CKMBINDEX", "TROPONINI" in the last 168 hours. BNP (last 3 results) No results for input(s): "PROBNP" in the last 8760 hours. HbA1C: No results for input(s): "HGBA1C" in the last 72 hours. CBG: No results for input(s): "GLUCAP" in the last 168 hours. Lipid Profile: No results for input(s): "CHOL", "HDL", "LDLCALC", "TRIG", "CHOLHDL", "LDLDIRECT" in the last 72 hours. Thyroid Function Tests: No results for input(s): "TSH", "T4TOTAL", "FREET4", "T3FREE", "THYROIDAB" in the last 72 hours. Anemia Panel: No results for input(s): "VITAMINB12", "FOLATE", "FERRITIN", "TIBC", "IRON", "RETICCTPCT" in the last 72 hours. Urine analysis:    Component Value Date/Time   COLORURINE YELLOW 10/03/2022 1955   APPEARANCEUR  CLEAR 10/03/2022 1955   LABSPEC 1.019 10/03/2022 1955   PHURINE 5.0 10/03/2022 1955   GLUCOSEU NEGATIVE 10/03/2022 1955   HGBUR NEGATIVE 10/03/2022 1955   BILIRUBINUR NEGATIVE 10/03/2022 1955   BILIRUBINUR neg 12/09/2015 1701   KETONESUR NEGATIVE 10/03/2022 1955   PROTEINUR NEGATIVE 10/03/2022 1955   UROBILINOGEN 0.2 12/09/2015 1701   UROBILINOGEN 0.2 04/13/2014 1422   NITRITE NEGATIVE 10/03/2022 1955   LEUKOCYTESUR TRACE (A) 10/03/2022 1955    Radiological Exams on Admission: CT ABDOMEN PELVIS W CONTRAST  Result Date: 10/02/2022 CLINICAL DATA:  Abdominal pain, acute, nonlocalized. EXAM: CT ABDOMEN AND PELVIS WITH CONTRAST TECHNIQUE: Multidetector CT imaging of the abdomen and pelvis was performed using the standard protocol following bolus administration of intravenous contrast. RADIATION DOSE REDUCTION: This exam was performed according to the departmental dose-optimization program which includes automated exposure control, adjustment of the mA and/or kV according to patient size and/or use of iterative reconstruction technique. CONTRAST:  20m OMNIPAQUE IOHEXOL 350 MG/ML SOLN COMPARISON:  03/16/2022. FINDINGS: Lower chest: No acute abnormality. Hepatobiliary: No focal liver abnormality is seen. Status post cholecystectomy. No biliary dilatation. Pneumobilia is noted. Pancreas: Unremarkable. No pancreatic ductal dilatation or surrounding inflammatory changes. Spleen: Spleen is mildly enlarged measuring 14.0 cm. Adrenals/Urinary Tract: No adrenal nodule or mass. The kidneys enhance symmetrically. There is a cyst in the mid left kidney. No renal calculus or hydronephrosis. The bladder is unremarkable. Stomach/Bowel: There is a small hiatal  hernia. Stomach is within normal limits. Appendix appears normal. No evidence of bowel wall thickening, distention, or inflammatory changes. No free air or pneumatosis. Colonic diverticulosis is noted. Subtle fat stranding is noted at the sigmoid colon in the  left lower quadrant, possible mild or early diverticulitis. Vascular/Lymphatic: No significant vascular findings are present. No enlarged abdominal or pelvic lymph nodes. Reproductive: Status post hysterectomy. No adnexal masses. Other: No abdominopelvic ascites. Musculoskeletal: Degenerative changes are present in the thoracolumbar spine. No acute osseous abnormality. IMPRESSION: 1. A sigmoid diverticulosis with subtle fat stranding in the left lower quadrant, possible mild or early diverticulitis. 2. Small hiatal hernia. 3. Mild splenomegaly. Electronically Signed   By: Brett Fairy M.D.   On: 10/02/2022 01:18    Assessment and Plan  Acute diverticulitis Presenting with left lower quadrant abdominal pain.  CT showing possible mild or early diverticulitis of the sigmoid colon and no complicating features such as perforation or abscess.  No fever, leukocytosis, or signs of sepsis. -Continue Zosyn -IV fluid hydration -IV Morphine as needed for pain -Bowel rest at this time -Monitor CBC  Influenza B infection Patient is coughing but not hypoxic. -Continue Tamiflu -Antitussive as needed -Chest x-ray  Left hip pain Bilateral knee pain with history of bilateral total knee arthroplasty X-rays negative for fracture or dislocation. No falls reported. -Outpatient orthopedic follow-up  CAD Stable, not endorsing anginal symptoms. -Continue aspirin and statin  Hypertension Stable. -Continue isosorbide and irbesartan (hospital formulary replacement for olmesartan)  Hyperlipidemia -Continue statin and fenofibrate  History of CVA -Continue aspirin and statin  Diet controlled type 2 diabetes  A1c 5.6 on 09/28/2022.  CKD stage IIIa  Creatinine 1.3, at baseline. -Continue to monitor renal function  Gout  Stable.  Patient is taking allopurinol 300 mg twice daily but it seems to be a high dose considering her chronic kidney disease.  I spoke to pharmacist and he confirmed that this is the  patient's current outpatient dose, last prescribed in December for 23-monthsupply. -Will need close follow-up with PCP for consideration of dose adjustment.  DVT prophylaxis: Lovenox Code Status: Full Code (discussed with the patient) Family Communication: Husband at bedside. Level of care: Telemetry bed Admission status: It is my clinical opinion that referral for OBSERVATION is reasonable and necessary in this patient based on the above information provided. The aforementioned taken together are felt to place the patient at high risk for further clinical deterioration. However, it is anticipated that the patient may be medically stable for discharge from the hospital within 24 to 48 hours.   VShela LeffMD [facility]  If 7PM-7AM, please contact night-coverage [email or web address]  10/03/2022, 10:46 PM

## 2022-10-03 NOTE — ED Provider Notes (Addendum)
[facility] Provider Note   CSN: [id number] Arrival date & time: 10/03/22  1603     History  Chief Complaint  Patient presents with   Abdominal Pain    Rachel Black is a 72 y.o. female.  The history is provided by the patient, medical records and the spouse. No language interpreter was used.  Abdominal Pain    72 year old female with significant history of diabetes, hyperlipidemia, hypertension, prior stroke, PFO presenting today with complaints of abdominal pain and leg pain.  Patient reports she has had recurrent abdominal pain ongoing for the past 2 to 3 months.  Pain is present on a daily basis, without associated nausea or vomiting.  And she also complaining of bilateral hip pain and bilateral knee pain for several weeks as well.  She has had knee surgery in the past.  She does not endorse fever or chills no runny nose sneezing or coughing no chest pain or shortness of breath no dysuria.  She was seen in the ED 2 days prior for her complaint.  She was given antibiotic and pain medication but states it has not provided adequate relief prompting this ER visit.  Home Medications Prior to Admission medications   Medication Sig Start Date End Date Taking? Authorizing Provider  ACCU-CHEK AVIVA PLUS test strip USE AS INSTRUCTED ONCE A DAY. DX CODE E11.9 07/20/22   Lorrene Reid, PA-C  Accu-Chek Softclix Lancets lancets Use as instructed once a day.  DX Code: E11.9 03/24/22   Lorrene Reid, PA-C  allopurinol (ZYLOPRIM) 300 MG tablet TAKE 1 TABLET BY MOUTH TWICE A DAY 08/27/22   Ronnell Freshwater, NP  ALPRAZolam (XANAX) 0.25 MG tablet Take 1 tablet (0.25 mg total) by mouth 2 (two) times daily as needed for anxiety. 03/19/22   Esterwood, [name] S, PA-C  aspirin 81 MG EC tablet Take 1 tablet (81 mg total) by mouth daily. 10/18/17   Cheryln Manly, NP  Calcium Carb-Cholecalciferol (CALCIUM 600-D PO) Take 1 tablet by mouth 2 (two) times daily  before a meal.    [provider]  cefdinir (OMNICEF) 300 MG capsule Take 1 capsule (300 mg total) by mouth 2 (two) times daily. 09/29/22   Davonna Belling, MD  dexlansoprazole (DEXILANT) 60 MG capsule Take 1 capsule (60 mg total) by mouth daily. 03/19/22   Esterwood, [name] S, PA-C  Emollient (UDDERLY SMOOTH) CREA Apply 1 application topically See admin instructions. Apply topically to skin folds as needed for rash/irritation    [provider]  fenofibrate 160 MG tablet Take 1 tablet (160 mg total) by mouth daily. 09/10/22   Ronnell Freshwater, NP  ferrous sulfate 325 (65 FE) MG tablet TAKE 1 TABLET BY MOUTH TWICE A DAY 08/03/22   Zehr, Janett Billow D, PA-C  isosorbide mononitrate (IMDUR) 60 MG 24 hr tablet Take 1 tablet (60 mg total) by mouth daily. 03/09/22   Minus Breeding, MD  linaclotide Rolan Lipa) 72 MCG capsule Take 1 capsule (72 mcg total) by mouth daily before breakfast. 09/28/22   Ronnell Freshwater, NP  metroNIDAZOLE (FLAGYL) 500 MG tablet Take 1 tablet (500 mg total) by mouth 2 (two) times daily. 09/29/22   Davonna Belling, MD  olmesartan (BENICAR) 5 MG tablet TAKE 1 TABLET (5 MG TOTAL) BY MOUTH DAILY. 07/12/22   Abonza, Maritza, PA-C  ondansetron (ZOFRAN) 4 MG tablet Take 1 tablet (4 mg total) by mouth every 6 (six) hours. 10/02/22   Marcello Fennel, PA-C  oxyCODONE-acetaminophen (PERCOCET/ROXICET) 5-325 MG tablet Take 1 tablet by mouth every 8 (eight) hours as needed for up to 3 days for severe pain. 10/02/22 10/05/22  Marcello Fennel, PA-C  pantoprazole (PROTONIX) 40 MG tablet Take 1 tablet (40 mg total) by mouth daily. 04/10/22   Irene Shipper, MD  rosuvastatin (CRESTOR) 40 MG tablet Take 1 tablet (40 mg total) by mouth daily. 03/24/22   Minus Breeding, MD  triamcinolone (KENALOG) 0.025 % cream Apply 1 Application topically 2 (two) times daily. 09/28/22   Ronnell Freshwater, NP  Vitamin D, Ergocalciferol, (DRISDOL) 1.25 MG (50000 UNIT) CAPS capsule TAKE 1 CAPSULE BY MOUTH ONE  TIME PER WEEK 07/12/22   Abonza, Maritza, PA-C      Allergies    Other and Penicillins    Review of Systems   Review of Systems  Gastrointestinal:  Positive for abdominal pain.  All other systems reviewed and are negative.   Physical Exam Updated Vital Signs BP 110/68   Pulse 88   Temp 99.3 F (37.4 C) (Oral)   Resp 18   SpO2 100%  Physical Exam Vitals and nursing note reviewed.  Constitutional:      General: She is not in acute distress.    Appearance: She is well-developed. She is obese.  HENT:     Head: Atraumatic.  Eyes:     Conjunctiva/sclera: Conjunctivae normal.  Cardiovascular:     Rate and Rhythm: Normal rate and regular rhythm.     Heart sounds: Normal heart sounds.  Pulmonary:     Effort: Pulmonary effort is normal.  Abdominal:     Palpations: Abdomen is soft.     Tenderness: There is generalized abdominal tenderness. There is no guarding or rebound.  Musculoskeletal:     Cervical back: Neck supple.  Skin:    Findings: No rash.  Neurological:     Mental Status: She is alert.  Psychiatric:        Mood and Affect: Mood normal.     ED Results / Procedures / Treatments   Labs (all labs ordered are listed, but only abnormal results are displayed) Labs Reviewed  RESP PANEL BY RT-PCR (RSV, FLU A&B, COVID)  RVPGX2 - Abnormal; Notable for the following components:      Result Value   Influenza B by PCR POSITIVE (*)    All other components within normal limits  COMPREHENSIVE METABOLIC PANEL - Abnormal; Notable for the following components:   Creatinine, Ser 1.33 (*)    Total Protein 6.1 (*)    Total Bilirubin <0.1 (*)    GFR, Estimated 43 (*)    All other components within normal limits  CBC WITH DIFFERENTIAL/PLATELET - Abnormal; Notable for the following components:   RBC 3.81 (*)    All other components within normal limits  URINALYSIS, ROUTINE W REFLEX MICROSCOPIC - Abnormal; Notable for the following components:   Leukocytes,Ua TRACE (*)    All  other components within normal limits    EKG None  Radiology CT ABDOMEN PELVIS W CONTRAST  Result Date: 10/02/2022 CLINICAL DATA:  Abdominal pain, acute, nonlocalized. EXAM: CT ABDOMEN AND PELVIS WITH CONTRAST TECHNIQUE: Multidetector CT imaging of the abdomen and pelvis was performed using the standard protocol following bolus administration of intravenous contrast. RADIATION DOSE REDUCTION: This exam was performed according to the departmental dose-optimization program which includes automated exposure control, adjustment of the mA and/or kV according to patient size and/or use of iterative reconstruction technique. CONTRAST:  6m OMNIPAQUE IOHEXOL 350  MG/ML SOLN COMPARISON:  03/16/2022. FINDINGS: Lower chest: No acute abnormality. Hepatobiliary: No focal liver abnormality is seen. Status post cholecystectomy. No biliary dilatation. Pneumobilia is noted. Pancreas: Unremarkable. No pancreatic ductal dilatation or surrounding inflammatory changes. Spleen: Spleen is mildly enlarged measuring 14.0 cm. Adrenals/Urinary Tract: No adrenal nodule or mass. The kidneys enhance symmetrically. There is a cyst in the mid left kidney. No renal calculus or hydronephrosis. The bladder is unremarkable. Stomach/Bowel: There is a small hiatal hernia. Stomach is within normal limits. Appendix appears normal. No evidence of bowel wall thickening, distention, or inflammatory changes. No free air or pneumatosis. Colonic diverticulosis is noted. Subtle fat stranding is noted at the sigmoid colon in the left lower quadrant, possible mild or early diverticulitis. Vascular/Lymphatic: No significant vascular findings are present. No enlarged abdominal or pelvic lymph nodes. Reproductive: Status post hysterectomy. No adnexal masses. Other: No abdominopelvic ascites. Musculoskeletal: Degenerative changes are present in the thoracolumbar spine. No acute osseous abnormality. IMPRESSION: 1. A sigmoid diverticulosis with subtle fat  stranding in the left lower quadrant, possible mild or early diverticulitis. 2. Small hiatal hernia. 3. Mild splenomegaly. Electronically Signed   By: Brett Fairy M.D.   On: 10/02/2022 01:18    Procedures Procedures    Medications Ordered in ED Medications  sodium chloride 0.9 % bolus 1,000 mL (0 mLs Intravenous Stopped 10/03/22 1950)  morphine (PF) 4 MG/ML injection 4 mg (4 mg Intravenous Given 10/03/22 1845)  piperacillin-tazobactam (ZOSYN) IVPB 3.375 g (0 g Intravenous Stopped 10/03/22 2029)    ED Course/ Medical Decision Making/ A&P                             Medical Decision Making Amount and/or Complexity of Data Reviewed Labs: ordered.  Risk Prescription drug management. Decision regarding hospitalization.   BP 116/69   Pulse 67   Temp 99.3 F (37.4 C) (Oral)   Resp 12   SpO2 100%   6:51 PM 72 year old female with significant history of diabetes, hyperlipidemia, hypertension, prior stroke, PFO presenting today with complaints of abdominal pain and leg pain.  Patient reports she has had recurrent abdominal pain ongoing for the past 2 to 3 months.  Pain is present on a daily basis, without associated nausea or vomiting.  And she also complaining of bilateral hip pain and bilateral knee pain for several weeks as well.  She has had knee surgery in the past.  She does not endorse fever or chills no runny nose sneezing or coughing no chest pain or shortness of breath no dysuria.  She was seen in the ED 2 days prior for her complaint.  She was given antibiotic and pain medication but states it has not provided adequate relief prompting this ER visit.  On exam this is an elderly female laying in bed appears to be in no acute discomfort.  Heart with normal rate and rhythm, lungs clear to auscultation bilaterally abdomen is soft but diffusely tender without guarding or rebound tenderness.  No focal point tenderness.  Vital sign review and overall reassuring.  No fever, no hypoxia  and normal blood pressure.  -Labs ordered, independently viewed and interpreted by me.  Labs remarkable for Cr 1.33, slight worse than prior.  IVF given.   -The patient was maintained on a cardiac monitor.  I personally viewed and interpreted the cardiac monitored which showed an underlying rhythm of: NSR -Imaging independently viewed and interpreted by me and I agree with  radiologist's interpretation.  Result remarkable for abd/pelvis CT from recent visit showing mild diverticulitis -This patient presents to the ED for concern of abd pain, this involves an extensive number of treatment options, and is a complaint that carries with it a high risk of complications and morbidity.  The differential diagnosis includes diverticulitis, colitis, pancreatitis, appendicitis, cholecystitis, UTI, pyelonephritis, ischemic bowel -Co morbidities that complicate the patient evaluation includes DM, PFO, CAD -Treatment includes Zosyn, IVF, opiate medication -Reevaluation of the patient after these medicines showed that the patient improved -PCP office notes or outside notes reviewed -Discussion with specialist [facility] Dr. Marlowe Sax who agrees to see and will admit pt. -Escalation to admission/observation considered: patient prefers hospital admission  7:14 PM Due to progressive worsening abdominal pain and hip and leg pain despite receiving opiate pain medication and antibiotic from previous visit, I felt patient may have failed outpatient treatment and may benefit from hospital admission for better pain management as well as antibiotic treatment for diverticular disease.  Patient has had prior imaging of her hips and knees from her last visits without acute changes.  I have initiated Zosyn antibiotic to treat for her diverticular disease  9:46 PM Since patient endorsed having pain to her hips, knees, abdomen and overall not feeling well, I did order a viral respiratory panel.  It finally resulted showing  positive influenza B.  This may be the reason why she continues to feel bad.  I have ordered Tamiflu as well.      Final Clinical Impression(s) / ED Diagnoses Final diagnoses:  Diverticulitis  AKI (acute kidney injury) ([place])  Influenza B    Rx / DC Orders ED Discharge Orders     None         Domenic Moras, PA-C 10/03/22 2122    Domenic Moras, PA-C 10/03/22 2147    Davonna Belling, MD 10/03/22 [phone]

## 2022-10-03 NOTE — ED Provider Triage Note (Signed)
Emergency Medicine Provider Triage Evaluation Note  Rachel Black , a 72 y.o. female  was evaluated in triage.  Pt complains of abdominal pain and bilateral knee pain.  This been going on for about a week, seen in the ED on the 18th/19th and diagnosed with diverticulitis, started on Omnicef and Flagyl.  Despite that she has been having worsening pain.  She is having bilateral knee pain, denies any falls.  No fevers, nausea, vomiting.  Having regular bowel movements although slightly looser.  CT abdomen 10/02/21 IMPRESSION: 1. A sigmoid diverticulosis with subtle fat stranding in the left lower quadrant, possible mild or early diverticulitis. 2. Small hiatal hernia. 3. Mild splenomegaly.  Bilateral knee plain films were negative as well on chart review.  Review of Systems  Per HPI  Physical Exam  BP 110/68   Pulse 88   Temp 99.3 F (37.4 C) (Oral)   Resp 18   SpO2 100%  Gen:   Awake, no distress   Resp:  Normal effort  MSK:   Moves extremities without difficulty  Other:  Abdomen is soft, very mildly tender left lower quadrant, right lower quadrant and suprapubically without guarding or rigidity..  Slightly lax left patella but no erythema, swelling.  Right knee very mildly tender, complete ROM to both.  Medical Decision Making  Medically screening exam initiated at 5:11 PM.  Appropriate orders placed.  Rachel Black was informed that the remainder of the evaluation will be completed by another provider, this initial triage assessment does not replace that evaluation, and the importance of remaining in the ED until their evaluation is complete.  Temperature is slightly elevated although not febrile, does not meet SIRS criteria and does not appear septic clinically.  Patient appears comfortable.  Will repeat CBC and CMP.   Rachel Raring, PA-C 10/03/22 1714

## 2022-10-03 NOTE — ED Triage Notes (Signed)
Pt reports abdominal pain, hip pain, and bilateral leg pain. Pt reports she was seen for the same on Thursday but the pain has gotten worse.

## 2022-10-04 DIAGNOSIS — I252 Old myocardial infarction: Secondary | ICD-10-CM | POA: Diagnosis not present

## 2022-10-04 DIAGNOSIS — K5792 Diverticulitis of intestine, part unspecified, without perforation or abscess without bleeding: Secondary | ICD-10-CM | POA: Diagnosis present

## 2022-10-04 DIAGNOSIS — E1122 Type 2 diabetes mellitus with diabetic chronic kidney disease: Secondary | ICD-10-CM | POA: Diagnosis present

## 2022-10-04 DIAGNOSIS — Z95828 Presence of other vascular implants and grafts: Secondary | ICD-10-CM | POA: Diagnosis not present

## 2022-10-04 DIAGNOSIS — M25552 Pain in left hip: Secondary | ICD-10-CM | POA: Diagnosis present

## 2022-10-04 DIAGNOSIS — Z79899 Other long term (current) drug therapy: Secondary | ICD-10-CM | POA: Diagnosis not present

## 2022-10-04 DIAGNOSIS — Z881 Allergy status to other antibiotic agents status: Secondary | ICD-10-CM | POA: Diagnosis not present

## 2022-10-04 DIAGNOSIS — M25562 Pain in left knee: Secondary | ICD-10-CM | POA: Diagnosis present

## 2022-10-04 DIAGNOSIS — M25561 Pain in right knee: Secondary | ICD-10-CM | POA: Diagnosis present

## 2022-10-04 DIAGNOSIS — Z880 Allergy status to penicillin: Secondary | ICD-10-CM | POA: Diagnosis not present

## 2022-10-04 DIAGNOSIS — Z91048 Other nonmedicinal substance allergy status: Secondary | ICD-10-CM | POA: Diagnosis not present

## 2022-10-04 DIAGNOSIS — K219 Gastro-esophageal reflux disease without esophagitis: Secondary | ICD-10-CM | POA: Diagnosis present

## 2022-10-04 DIAGNOSIS — Z96653 Presence of artificial knee joint, bilateral: Secondary | ICD-10-CM | POA: Diagnosis present

## 2022-10-04 DIAGNOSIS — K5732 Diverticulitis of large intestine without perforation or abscess without bleeding: Secondary | ICD-10-CM | POA: Diagnosis present

## 2022-10-04 DIAGNOSIS — Z1152 Encounter for screening for COVID-19: Secondary | ICD-10-CM | POA: Diagnosis not present

## 2022-10-04 DIAGNOSIS — N1831 Chronic kidney disease, stage 3a: Secondary | ICD-10-CM | POA: Diagnosis present

## 2022-10-04 DIAGNOSIS — E785 Hyperlipidemia, unspecified: Secondary | ICD-10-CM | POA: Diagnosis present

## 2022-10-04 DIAGNOSIS — I2510 Atherosclerotic heart disease of native coronary artery without angina pectoris: Secondary | ICD-10-CM | POA: Diagnosis present

## 2022-10-04 DIAGNOSIS — J101 Influenza due to other identified influenza virus with other respiratory manifestations: Secondary | ICD-10-CM | POA: Diagnosis present

## 2022-10-04 DIAGNOSIS — I129 Hypertensive chronic kidney disease with stage 1 through stage 4 chronic kidney disease, or unspecified chronic kidney disease: Secondary | ICD-10-CM | POA: Diagnosis present

## 2022-10-04 DIAGNOSIS — K5909 Other constipation: Secondary | ICD-10-CM | POA: Diagnosis present

## 2022-10-04 DIAGNOSIS — E876 Hypokalemia: Secondary | ICD-10-CM | POA: Diagnosis present

## 2022-10-04 DIAGNOSIS — M109 Gout, unspecified: Secondary | ICD-10-CM | POA: Diagnosis present

## 2022-10-04 DIAGNOSIS — Z8673 Personal history of transient ischemic attack (TIA), and cerebral infarction without residual deficits: Secondary | ICD-10-CM | POA: Diagnosis not present

## 2022-10-04 LAB — BASIC METABOLIC PANEL
Anion gap: 7 (ref 5–15)
BUN: 12 mg/dL (ref 8–23)
CO2: 22 mmol/L (ref 22–32)
Calcium: 8.9 mg/dL (ref 8.9–10.3)
Chloride: 108 mmol/L (ref 98–111)
Creatinine, Ser: 1.17 mg/dL — ABNORMAL HIGH (ref 0.44–1.00)
GFR, Estimated: 50 mL/min — ABNORMAL LOW (ref >60)
Glucose, Bld: 91 mg/dL (ref 70–99)
Potassium: 3.4 mmol/L — ABNORMAL LOW (ref 3.5–5.1)
Sodium: 137 mmol/L (ref 135–145)

## 2022-10-04 LAB — CBC
HCT: 31.8 % — ABNORMAL LOW (ref 36.0–46.0)
Hemoglobin: 10.6 g/dL — ABNORMAL LOW (ref 12.0–15.0)
MCH: 31.9 pg (ref 26.0–34.0)
MCHC: 33.3 g/dL (ref 30.0–36.0)
MCV: 95.8 fL (ref 80.0–100.0)
Platelets: 131 10*3/uL — ABNORMAL LOW (ref 150–400)
RBC: 3.32 MIL/uL — ABNORMAL LOW (ref 3.87–5.11)
RDW: 13.2 % (ref 11.5–15.5)
WBC: 4.7 10*3/uL (ref 4.0–10.5)
nRBC: 0 % (ref 0.0–0.2)

## 2022-10-04 LAB — GLUCOSE, CAPILLARY: Glucose-Capillary: 97 mg/dL (ref 70–99)

## 2022-10-04 LAB — HIV ANTIBODY (ROUTINE TESTING W REFLEX): HIV Screen 4th Generation wRfx: NONREACTIVE

## 2022-10-04 MED ORDER — PIPERACILLIN-TAZOBACTAM 3.375 G IVPB
3.3750 g | Freq: Three times a day (TID) | INTRAVENOUS | Status: DC
  Administered 2022-10-04 – 2022-10-06 (×8): 3.375 g via INTRAVENOUS
  Filled 2022-10-04 (×8): qty 50

## 2022-10-04 MED ORDER — GUAIFENESIN-DM 100-10 MG/5ML PO SYRP: 5.0000 mL | ORAL_SOLUTION | ORAL | Status: DC | PRN

## 2022-10-04 MED ORDER — SODIUM CHLORIDE 0.9 % IV SOLN: INTRAVENOUS | Status: DC

## 2022-10-04 NOTE — ED Notes (Signed)
ED TO INPATIENT HANDOFF REPORT  ED Nurse Name and Phone #: [phone]  S Name/Age/Gender Rachel Black 72 y.o. female Room/Bed: 015C/015C  Code Status   Code Status: Full Code  Home/SNF/Other Home Patient oriented to: self, place, time, and situation Is this baseline? Yes   Triage Complete: Triage complete  Chief Complaint Acute diverticulitis [K57.92]  Triage Note Pt reports abdominal pain, hip pain, and bilateral leg pain. Pt reports she was seen for the same on Thursday but the pain has gotten worse.    Allergies Allergies  Allergen Reactions   Other Other (See Comments)    Some BANDAIDS cause skin irritation    Penicillins Itching    Has patient had a PCN reaction causing immediate rash, facial/tongue/throat swelling, SOB or lightheadedness with hypotension: No Has patient had a PCN reaction causing severe rash involving mucus membranes or skin necrosis: No Has patient had a PCN reaction that required hospitalization No Has patient had a PCN reaction occurring within the last 10 years: No If all of the above answers are "NO", then may proceed with Cephalosporin use.     Level of Care/Admitting Diagnosis ED Disposition     ED Disposition  Admit   Condition  --   [place]: [place] [[id number]]  Level of Care: Telemetry Medical [104]  May place patient in observation at [facility] or [place] if equivalent level of care is available:: Yes  Covid Evaluation: Asymptomatic - no recent exposure (last 10 days) testing not required  Diagnosis: Acute diverticulitis [[id number]]  Admitting Physician: Shela Leff [[id number]]  Attending Physician: Shela Leff [[id number]]          B Medical/Surgery History Past Medical History:  Diagnosis Date   Arthritis    PAIN AND OA BOTH KNEES AND SHOULDERS AND ELBOWS AND WRIST   Blood transfusion without reported diagnosis 2018   after cholecystectomy   Broken foot, right,  closed, initial encounter 09/15/2021   no surgery needed, fell at home and went to ED   Diabetes mellitus    ORAL MEDICATION   GERD (gastroesophageal reflux disease)    Gout    NO RECENT FLARE UPS   H/O: rheumatic fever    AS A CHILD - NO KNOWN HEART MURMUR OR HEART PROBLEMS   Heart attack ([place])    Hyperlipidemia    Hypertension    PFO (patent foramen ovale)    Stroke ([place]) 03/2017   Past Surgical History:  Procedure Laterality Date   ABDOMINAL HYSTERECTOMY     CESAREAN SECTION     CHOLECYSTECTOMY N/A 12/09/2016   Procedure: LAPAROSCOPIC CHOLECYSTECTOMY WITH INTRAOPERATIVE CHOLANGIOGRAM;  Surgeon: Stark Klein, MD;  Location: [facility];  Service: General;  Laterality: N/A;   COLONOSCOPY     COLONOSCOPY WITH PROPOFOL N/A 01/30/2013   Procedure: COLONOSCOPY WITH PROPOFOL;  Surgeon: Irene Shipper, MD;  Location: [facility];  Service: Endoscopy;  Laterality: N/A;   ERCP N/A 12/08/2016   Procedure: ENDOSCOPIC RETROGRADE CHOLANGIOPANCREATOGRAPHY (ERCP);  Surgeon: Ladene Artist, MD;  Location: [facility];  Service: Endoscopy;  Laterality: N/A;   ESOPHAGOGASTRODUODENOSCOPY (EGD) WITH PROPOFOL N/A 01/30/2013   Procedure: ESOPHAGOGASTRODUODENOSCOPY (EGD) WITH PROPOFOL;  Surgeon: Irene Shipper, MD;  [facility];  Service: Endoscopy;  Laterality: N/A;   LEFT HEART CATH AND CORONARY ANGIOGRAPHY N/A 10/18/2017   Procedure: LEFT HEART CATH AND CORONARY ANGIOGRAPHY;  Surgeon: Martinique, Peter M, MD;  Location: [facility];  Service: Cardiovascular;  Laterality: N/A;  LOOP RECORDER INSERTION N/A 03/18/2017   Procedure: Loop Recorder Insertion;  Surgeon: Constance Haw, MD;  Location: [facility];  Service: Cardiovascular;  Laterality: N/A;   PARTIAL HYSTERECTOMY     TEE WITHOUT CARDIOVERSION N/A 03/16/2017   Procedure: TRANSESOPHAGEAL ECHOCARDIOGRAM (TEE);  Surgeon: Acie Fredrickson Wonda Cheng, MD;  Location: [facility] ENDOSCOPY;  Service: Cardiovascular;  Laterality: N/A;   TOTAL KNEE  ARTHROPLASTY Right 04/20/2014   Procedure: RIGHT TOTAL KNEE ARTHROPLASTY CONVERTED TO RIGHT KNEE REIMPLANTATION;  Surgeon: Mcarthur Rossetti, MD;  Location: [facility];  Service: Orthopedics;  Laterality: Right;   TOTAL KNEE ARTHROPLASTY Left 08/23/2015   Procedure: LEFT TOTAL KNEE ARTHROPLASTY;  Surgeon: Mcarthur Rossetti, MD;  Location: [facility];  Service: Orthopedics;  Laterality: Left;   VENTRAL HERNIA REPAIR     x2     A IV Location/Drains/Wounds Patient Lines/Drains/Airways Status     Active Line/Drains/Airways     Name Placement date Placement time Site Days   Peripheral IV 10/03/22 20 G Posterior;Left Hand 10/03/22  --  Hand  1   Incision - 4 Ports Abdomen 1: Right;Lower 2: Right;Superior 3: Umbilicus 4: Umbilicus;Superior 12/09/16  1446  -- 2125            Intake/Output Last 24 hours No intake or output data in the 24 hours ending 10/04/22 6389  Labs/Imaging Results for orders placed or performed during the hospital encounter of 10/03/22 (from the past 48 hour(s))  Comprehensive metabolic panel     Status: Abnormal   Collection Time: 10/03/22  5:14 PM  Result Value Ref Range   Sodium 136 135 - 145 mmol/L   Potassium 3.6 3.5 - 5.1 mmol/L   Chloride 105 98 - 111 mmol/L   CO2 25 22 - 32 mmol/L   Glucose, Bld 97 70 - 99 mg/dL    Comment: Glucose reference range applies only to samples taken after fasting for at least 8 hours.   BUN 17 8 - 23 mg/dL   Creatinine, Ser 1.33 (H) 0.44 - 1.00 mg/dL   Calcium 9.7 8.9 - 10.3 mg/dL   Total Protein 6.1 (L) 6.5 - 8.1 g/dL   Albumin 3.9 3.5 - 5.0 g/dL   AST 34 15 - 41 U/L   ALT 25 0 - 44 U/L   Alkaline Phosphatase 42 38 - 126 U/L   Total Bilirubin <0.1 (L) 0.3 - 1.2 mg/dL   GFR, Estimated 43 (L) >60 mL/min    Comment: (NOTE) Calculated using the CKD-EPI Creatinine Equation (2021)    Anion gap 6 5 - 15    Comment: Performed at [facility] [address], [place], [place] [address]  CBC with Differential      Status: Abnormal   Collection Time: 10/03/22  5:14 PM  Result Value Ref Range   WBC 6.0 4.0 - 10.5 K/uL   RBC 3.81 (L) 3.87 - 5.11 MIL/uL   Hemoglobin 12.4 12.0 - 15.0 g/dL   HCT 36.6 36.0 - 46.0 %   MCV 96.1 80.0 - 100.0 fL   MCH 32.5 26.0 - 34.0 pg   MCHC 33.9 30.0 - 36.0 g/dL   RDW 13.2 11.5 - 15.5 %   Platelets 165 150 - 400 K/uL   nRBC 0.0 0.0 - 0.2 %   Neutrophils Relative % 76 %   Neutro Abs 4.6 1.7 - 7.7 K/uL   Lymphocytes Relative 16 %   Lymphs Abs 1.0 0.7 - 4.0 K/uL   Monocytes Relative 7 %  Monocytes Absolute 0.4 0.1 - 1.0 K/uL   Eosinophils Relative 0 %   Eosinophils Absolute 0.0 0.0 - 0.5 K/uL   Basophils Relative 0 %   Basophils Absolute 0.0 0.0 - 0.1 K/uL   Immature Granulocytes 1 %   Abs Immature Granulocytes 0.03 0.00 - 0.07 K/uL    Comment: Performed at [facility], [place] [address] [place], [place] [address]  Resp panel by RT-PCR (RSV, Flu A&B, Covid) Anterior Nasal Swab     Status: Abnormal   Collection Time: 10/03/22  6:52 PM   Specimen: Anterior Nasal Swab  Result Value Ref Range   SARS Coronavirus 2 by RT PCR NEGATIVE NEGATIVE    Comment: (NOTE) SARS-CoV-2 target nucleic acids are NOT DETECTED.  The SARS-CoV-2 RNA is generally detectable in upper respiratory specimens during the acute phase of infection. The lowest concentration of SARS-CoV-2 viral copies this assay can detect is 138 copies/mL. A negative result does not preclude SARS-Cov-2 infection and should not be used as the sole basis for treatment or other patient management decisions. A negative result may occur with  improper specimen collection/handling, submission of specimen other than nasopharyngeal swab, presence of viral mutation(s) within the areas targeted by this assay, and inadequate number of viral copies(<138 copies/mL). A negative result must be combined with clinical observations, patient history, and epidemiological information. The expected result is Negative.  Fact  Sheet for Patients:  EntrepreneurPulse.com.au  Fact Sheet for Healthcare Providers:  IncredibleEmployment.be  This test is no t yet approved or cleared by the [place] FDA and  has been authorized for detection and/or diagnosis of SARS-CoV-2 by FDA under an Emergency Use Authorization (EUA). This EUA will remain  in effect (meaning this test can be used) for the duration of the COVID-19 declaration under Section 564(b)(1) of the Act, 21 U.S.C.section 360bbb-3(b)(1), unless the authorization is terminated  or revoked sooner.       Influenza A by PCR NEGATIVE NEGATIVE   Influenza B by PCR POSITIVE (A) NEGATIVE    Comment: (NOTE) The Xpert Xpress SARS-CoV-2/FLU/RSV plus assay is intended as an aid in the diagnosis of influenza from Nasopharyngeal swab specimens and should not be used as a sole basis for treatment. Nasal washings and aspirates are unacceptable for Xpert Xpress SARS-CoV-2/FLU/RSV testing.  Fact Sheet for Patients: EntrepreneurPulse.com.au  Fact Sheet for Healthcare Providers: IncredibleEmployment.be  This test is not yet approved or cleared by the [place] FDA and has been authorized for detection and/or diagnosis of SARS-CoV-2 by FDA under an Emergency Use Authorization (EUA). This EUA will remain in effect (meaning this test can be used) for the duration of the COVID-19 declaration under Section 564(b)(1) of the Act, 21 U.S.C. section 360bbb-3(b)(1), unless the authorization is terminated or revoked.     Resp Syncytial Virus by PCR NEGATIVE NEGATIVE    Comment: (NOTE) Fact Sheet for Patients: EntrepreneurPulse.com.au  Fact Sheet for Healthcare Providers: IncredibleEmployment.be  This test is not yet approved or cleared by the [place] FDA and has been authorized for detection and/or diagnosis of SARS-CoV-2 by FDA under an Emergency Use  Authorization (EUA). This EUA will remain in effect (meaning this test can be used) for the duration of the COVID-19 declaration under Section 564(b)(1) of the Act, 21 U.S.C. section 360bbb-3(b)(1), unless the authorization is terminated or revoked.  Performed at [facility], [place] [address] [place], [place] [address]   Urinalysis, Routine w reflex microscopic     Status: Abnormal  Collection Time: 10/03/22  7:55 PM  Result Value Ref Range   Color, Urine YELLOW YELLOW   APPearance CLEAR CLEAR   Specific Gravity, Urine 1.019 1.005 - 1.030   pH 5.0 5.0 - 8.0   Glucose, UA NEGATIVE NEGATIVE mg/dL   Hgb urine dipstick NEGATIVE NEGATIVE   Bilirubin Urine NEGATIVE NEGATIVE   Ketones, ur NEGATIVE NEGATIVE mg/dL   Protein, ur NEGATIVE NEGATIVE mg/dL   Nitrite NEGATIVE NEGATIVE   Leukocytes,Ua TRACE (A) NEGATIVE   RBC / HPF 0-5 0 - 5 RBC/hpf   WBC, UA 0-5 0 - 5 WBC/hpf   Bacteria, UA NONE SEEN NONE SEEN   Squamous Epithelial / HPF 0-5 0 - 5 /HPF   Mucus PRESENT     Comment: Performed at [facility], [place] [address] [place], [place] [address]  CBC     Status: Abnormal   Collection Time: 10/04/22  5:16 AM  Result Value Ref Range   WBC 4.7 4.0 - 10.5 K/uL   RBC 3.32 (L) 3.87 - 5.11 MIL/uL   Hemoglobin 10.6 (L) 12.0 - 15.0 g/dL   HCT 31.8 (L) 36.0 - 46.0 %   MCV 95.8 80.0 - 100.0 fL   MCH 31.9 26.0 - 34.0 pg   MCHC 33.3 30.0 - 36.0 g/dL   RDW 13.2 11.5 - 15.5 %   Platelets 131 (L) 150 - 400 K/uL    Comment: REPEATED TO VERIFY   nRBC 0.0 0.0 - 0.2 %    Comment: Performed at [facility], [place] [address] [place], [place] [address]  Basic metabolic panel     Status: Abnormal   Collection Time: 10/04/22  5:16 AM  Result Value Ref Range   Sodium 137 135 - 145 mmol/L   Potassium 3.4 (L) 3.5 - 5.1 mmol/L   Chloride 108 98 - 111 mmol/L   CO2 22 22 - 32 mmol/L   Glucose, Bld 91 70 - 99 mg/dL    Comment: Glucose reference range applies only to samples taken after  fasting for at least 8 hours.   BUN 12 8 - 23 mg/dL   Creatinine, Ser 1.17 (H) 0.44 - 1.00 mg/dL   Calcium 8.9 8.9 - 10.3 mg/dL   GFR, Estimated 50 (L) >60 mL/min    Comment: (NOTE) Calculated using the CKD-EPI Creatinine Equation (2021)    Anion gap 7 5 - 15    Comment: Performed at [facility] [address], [place], [place] [address]   DG CHEST PORT 1 VIEW  Result Date: 10/04/2022 CLINICAL DATA:  Cough, influenza B. EXAM: PORTABLE CHEST 1 VIEW COMPARISON:  None Available. FINDINGS: The heart size and mediastinal contours are within normal limits. Lung volumes are low with mild atelectasis at the left lung base. No effusion or pneumothorax. A loop recorder device is present over the left chest. No acute osseous abnormality. IMPRESSION: Low lung volumes with mild atelectasis at the right lung base. Electronically Signed   By: Brett Fairy M.D.   On: 10/04/2022 00:06    Pending Labs Unresulted Labs (From admission, onward)     Start     Ordered   10/03/22 2350  HIV Antibody (routine testing w rflx)  (HIV Antibody (Routine testing w reflex) panel)  Once,   R        10/03/22 2352            Vitals/Pain Today's Vitals   10/04/22 0600 10/04/22 0606 10/04/22 0610 10/04/22 0350  BP: (!) 104/55   (!) 103/58  Pulse: 64   (!) 57  Resp: 11   15  Temp:   98.3 F (36.8 C)   TempSrc:   Oral   SpO2: 95%   99%  PainSc:  6       Isolation Precautions No active isolations  Medications Medications  morphine (PF) 2 MG/ML injection 1 mg (has no administration in time range)  oseltamivir (TAMIFLU) capsule 30 mg (has no administration in time range)  0.9 %  sodium chloride infusion ( Intravenous New Bag/Given 10/04/22 0144)  aspirin EC tablet 81 mg (has no administration in time range)  fenofibrate tablet 160 mg (has no administration in time range)  isosorbide mononitrate (IMDUR) 24 hr tablet 30 mg (has no administration in time range)  irbesartan (AVAPRO) tablet 37.5 mg (has  no administration in time range)  rosuvastatin (CRESTOR) tablet 40 mg (has no administration in time range)  enoxaparin (LOVENOX) injection 40 mg (has no administration in time range)  acetaminophen (TYLENOL) tablet 650 mg (650 mg Oral Given 10/04/22 0606)    Or  acetaminophen (TYLENOL) suppository 650 mg ( Rectal See Alternative 10/04/22 0606)  allopurinol (ZYLOPRIM) tablet 300 mg (has no administration in time range)  guaiFENesin-dextromethorphan (ROBITUSSIN DM) 100-10 MG/5ML syrup 5 mL (has no administration in time range)  piperacillin-tazobactam (ZOSYN) IVPB 3.375 g (0 g Intravenous Stopped 10/04/22 0506)  sodium chloride 0.9 % bolus 1,000 mL (0 mLs Intravenous Stopped 10/03/22 1950)  morphine (PF) 4 MG/ML injection 4 mg (4 mg Intravenous Given 10/03/22 1845)  piperacillin-tazobactam (ZOSYN) IVPB 3.375 g (0 g Intravenous Stopped 10/03/22 2029)  oseltamivir (TAMIFLU) capsule 75 mg (75 mg Oral Given 10/03/22 2226)    Mobility walks with person assist or wheelchair dependent on pt     Focused Assessments Neuro Assessment Handoff Respiratory Assessment Handoff AO4 Nonproductive cough         R Recommendations: See Admitting Provider Note  Report given to:   Additional Notes:

## 2022-10-04 NOTE — Progress Notes (Addendum)
Pharmacy Antibiotic Note  Rachel Black is a 72 y.o. female admitted on 10/03/2022 with  intra-abdominal infection .  Pharmacy has been consulted for Zosyn dosing.  Noted PCN allergy (itch); Zosyn had been administered in ED several hours ago, d/w RN who reports no adverse effects during or after infusion; she is aware to monitor pt with next dose and pass along to next RN for monitoring; admitting MD aware of plan.  Plan: Zosyn 3.375g IV q8h (4 hour infusion). Monitor for allergic rxn; amend allergy hx if no ADE.   Temp (24hrs), Avg:99 F (37.2 C), Min:98.7 F (37.1 C), Max:99.3 F (37.4 C)  Recent Labs  Lab 09/29/22 1615 10/01/22 1718 10/03/22 1714  WBC 7.3 6.5 6.0  CREATININE 1.14* 1.10* 1.33*    Estimated Creatinine Clearance: 42.6 mL/min (A) (by C-G formula based on SCr of 1.33 mg/dL (H)).    Allergies  Allergen Reactions   Other Other (See Comments)    Some BANDAIDS cause skin irritation    Penicillins Itching    Has patient had a PCN reaction causing immediate rash, facial/tongue/throat swelling, SOB or lightheadedness with hypotension: No Has patient had a PCN reaction causing severe rash involving mucus membranes or skin necrosis: No Has patient had a PCN reaction that required hospitalization No Has patient had a PCN reaction occurring within the last 10 years: No If all of the above answers are "NO", then may proceed with Cephalosporin use.     Thank you for allowing pharmacy to be a part of this patient's care.  Wynona Neat, PharmD, BCPS  10/04/2022 1:02 AM

## 2022-10-04 NOTE — Progress Notes (Signed)
PROGRESS NOTE    Rachel Black  PJK:[id number] DOB: 1951/01/18 DOA: 10/03/2022 PCP: Lorrene Reid, PA-C    Brief Narrative:   Rachel Black is a 72 y.o. female with past medical history significant for CAD, HTN, HLD, PFO, history of CVA, DM2, CKD stage IIIa, GERD, gout, osteoarthritis s/p bilateral knee replacement who presents to [facility] ED on 1/20 with continued abdominal, hip and lower extremity pain.  Patient reports left lower quadrant pain, ongoing for several days.  Patient also endorses chronic constipation in which she takes medications and eats prunes for this.  Also reports coughing over the last few days.  Denies shortness of breath, no fever, no chest pain, no palpitation.  Recently seen in the ED on 1/16 for abdominal pain and left hip pain.  UA was concerning for infection.  X-rays of lumbar spine/pelvis/left hip negative for acute finding.  She was discharged on Omnicef and Flagyl for UTI and possible colitis.  No urine culture sent.  She returned to the ED on 1/18 complaining of ongoing abdominal pain and also bilateral knee pain. CT abdomen pelvis with contrast showing possible mild or early diverticulitis of the sigmoid colon and no complicating features such as perforation or abscess. Bilateral knee x-rays negative for acute finding.   In the ED, temperature 99.3 F, HR 88, RR 18, BP 110/68, SpO2 100% on room air.  WBC 6.0, hemoglobin 12.4, platelets 165.  Sodium 136, potassium 3.6, chloride 105, CO2 25, glucose 97, BUN 17, creatinine 1.33.  AST 34, ALT 25, total bilirubin less than 0.1.  COVID/RSV PCR negative.  Influenza B PCR positive.  Urinalysis unrevealing.  Patient was given Zosyn, morphine, Tamiflu and 1 L NS bolus.  TRH consulted for admission for further evaluation management of diverticulitis and influenza B viral infection.  Assessment & Plan:   Acute diverticulitis Patient presenting to ED with persistent left lower quadrant pain.  Recent CT Abd/pelvis notable for  mild/early diverticulitis of the sigmoid colon with no complicating features such as perforation or abscess.  Lipase 51.  Patient is afebrile without leukocytosis. --Continue Zosyn 3.375 g IV every 8 hours -- IVF with NS at 75 mL/h -- Clear liquid diet, will advance as tolerates -- Supportive care, pain control, antiemetics  Influenza B viral infection Chest x-ray with low lung volumes with mild atelectasis right lung base.  No consolidation. -- Tamiflu 75 mg p.o. twice daily -- Incentive spirometry -- Droplet precautions -- Supportive care  Left hip pain Bilateral knee pain Hx osteoarthritis s/p bilateral TKA X-rays negative for fracture or dislocation.  No falls reported. -- PT/OT evaluation -- Patient follow-up with orthopedics  CAD -- Continue aspirin and statin  Essential hypertension On isosorbide and irbesartan outpatient.  Blood pressures borderline low. --Continue to hold home antihypertensives -- Continue monitor BP closely  Dyslipidemia --Crestor 40 mg p.o. daily --Fenofibrate 160 mg p.o. daily  History of CVA -- Continue aspirin and statin  Type 2 diabetes mellitus Diet controlled at baseline.  Hemoglobin A1c 5.6, well-controlled.  CKD stage IIIa --Cr 1.33>1.17; stable and at baseline -- Avoid nephrotoxins, renal dose all medications -- BMP in a.m.  Gout  Stable.  Patient is taking allopurinol 300 mg twice daily but it seems to be a high dose considering her chronic kidney disease.  I spoke to pharmacist and he confirmed that this is the patient's current outpatient dose, last prescribed in December for 49-monthsupply. -Will need close follow-up with PCP for consideration of dose adjustment.  DVT prophylaxis: enoxaparin (LOVENOX) injection 40 mg Start: 10/04/22 0800    Code Status: Full Code Family Communication: Updated spouse present at bedside this morning  Disposition Plan:  Level of care: Telemetry Medical Status is: Observation The patient  remains OBS appropriate and will d/c before 2 midnights.    Consultants:  None  Procedures:  None  Antimicrobials:  Zosyn 1/20>>   Subjective: Patient seen examined bedside, resting comfortably.  Lying in bed.  Spouse present.  RN present at bedside.  Continues to complain of left lower quadrant abdominal pain.  No other specific complaints or concerns at this time.  Remains on IV antibiotics.  Discussed progression of diverticulitis and will start with clear liquid diet and further progress as she tolerates.  Denies headache, no dizziness, no chest pain, palpitations, no shortness of breath, no fever/chills/night sweats, no nausea/vomiting/diarrhea, no focal weakness, no cough/congestion, no fatigue, no paresthesias.  No acute events overnight per nursing staff.  Objective: Vitals:   10/04/22 0610 10/04/22 0615 10/04/22 0705 10/04/22 0732  BP:  (!) 103/58 (!) 99/54 106/75  Pulse:  (!) 57 61 60  Resp:  15 (!) 22 18  Temp: 98.3 F (36.8 C)   98.6 F (37 C)  TempSrc: Oral   Oral  SpO2:  99% 94% 96%    Intake/Output Summary (Last 24 hours) at 10/04/2022 1010 Last data filed at 10/04/2022 0920 Gross per 24 hour  Intake 180 ml  Output --  Net 180 ml   There were no vitals filed for this visit.  Examination:  Physical Exam: GEN: NAD, alert and oriented x 3, wd/wn HEENT: NCAT, PERRL, EOMI, sclera clear, MMM PULM: CTAB w/o wheezes/crackles, normal respiratory effort, on room air CV: RRR w/o M/G/R GI: abd soft, mild tenderness left lower quadrant, nondistended, NABS, no R/G/M MSK: no peripheral edema, muscle strength globally intact 5/5 bilateral upper/lower extremities NEURO: CN II-XII intact, no focal deficits, sensation to light touch intact PSYCH: normal mood/affect Integumentary: dry/intact, no rashes or wounds    Data Reviewed: I have personally reviewed following labs and imaging studies  CBC: Recent Labs  Lab 09/29/22 1615 10/01/22 1718 10/03/22 1714  10/04/22 0516  WBC 7.3 6.5 6.0 4.7  NEUTROABS 4.5 3.9 4.6  --   HGB 12.5 12.3 12.4 10.6*  HCT 36.8 36.4 36.6 31.8*  MCV 95.3 95.3 96.1 95.8  PLT 206 194 165 470*   Basic Metabolic Panel: Recent Labs  Lab 09/29/22 1615 10/01/22 1718 10/03/22 1714 10/04/22 0516  NA 138 139 136 137  K 4.4 3.8 3.6 3.4*  CL 104 107 105 108  CO2 '24 23 25 22  '$ GLUCOSE 92 90 97 91  BUN '18 19 17 12  '$ CREATININE 1.14* 1.10* 1.33* 1.17*  CALCIUM 9.9 9.9 9.7 8.9   GFR: Estimated Creatinine Clearance: 48.5 mL/min (A) (by C-G formula based on SCr of 1.17 mg/dL (H)). Liver Function Tests: Recent Labs  Lab 09/29/22 1615 10/01/22 1718 10/03/22 1714  AST 21 24 34  ALT '18 17 25  '$ ALKPHOS 44 45 42  BILITOT 0.4 0.6 <0.1*  PROT 6.2* 6.2* 6.1*  ALBUMIN 3.9 3.9 3.9   Recent Labs  Lab 09/29/22 1615 10/01/22 1718  LIPASE 54* 51   No results for input(s): "AMMONIA" in the last 168 hours. Coagulation Profile: No results for input(s): "INR", "PROTIME" in the last 168 hours. Cardiac Enzymes: No results for input(s): "CKTOTAL", "CKMB", "CKMBINDEX", "TROPONINI" in the last 168 hours. BNP (last 3 results) No results for input(s): "  PROBNP" in the last 8760 hours. HbA1C: No results for input(s): "HGBA1C" in the last 72 hours. CBG: Recent Labs  Lab 10/04/22 0822  GLUCAP 97   Lipid Profile: No results for input(s): "CHOL", "HDL", "LDLCALC", "TRIG", "CHOLHDL", "LDLDIRECT" in the last 72 hours. Thyroid Function Tests: No results for input(s): "TSH", "T4TOTAL", "FREET4", "T3FREE", "THYROIDAB" in the last 72 hours. Anemia Panel: No results for input(s): "VITAMINB12", "FOLATE", "FERRITIN", "TIBC", "IRON", "RETICCTPCT" in the last 72 hours. Sepsis Labs: No results for input(s): "PROCALCITON", "LATICACIDVEN" in the last 168 hours.  Recent Results (from the past 240 hour(s))  Resp panel by RT-PCR (RSV, Flu A&B, Covid) Anterior Nasal Swab     Status: Abnormal   Collection Time: 10/03/22  6:52 PM   Specimen:  Anterior Nasal Swab  Result Value Ref Range Status   SARS Coronavirus 2 by RT PCR NEGATIVE NEGATIVE Final    Comment: (NOTE) SARS-CoV-2 target nucleic acids are NOT DETECTED.  The SARS-CoV-2 RNA is generally detectable in upper respiratory specimens during the acute phase of infection. The lowest concentration of SARS-CoV-2 viral copies this assay can detect is 138 copies/mL. A negative result does not preclude SARS-Cov-2 infection and should not be used as the sole basis for treatment or other patient management decisions. A negative result may occur with  improper specimen collection/handling, submission of specimen other than nasopharyngeal swab, presence of viral mutation(s) within the areas targeted by this assay, and inadequate number of viral copies(<138 copies/mL). A negative result must be combined with clinical observations, patient history, and epidemiological information. The expected result is Negative.  Fact Sheet for Patients:  EntrepreneurPulse.com.au  Fact Sheet for Healthcare Providers:  IncredibleEmployment.be  This test is no t yet approved or cleared by the [place] FDA and  has been authorized for detection and/or diagnosis of SARS-CoV-2 by FDA under an Emergency Use Authorization (EUA). This EUA will remain  in effect (meaning this test can be used) for the duration of the COVID-19 declaration under Section 564(b)(1) of the Act, 21 U.S.C.section 360bbb-3(b)(1), unless the authorization is terminated  or revoked sooner.       Influenza A by PCR NEGATIVE NEGATIVE Final   Influenza B by PCR POSITIVE (A) NEGATIVE Final    Comment: (NOTE) The Xpert Xpress SARS-CoV-2/FLU/RSV plus assay is intended as an aid in the diagnosis of influenza from Nasopharyngeal swab specimens and should not be used as a sole basis for treatment. Nasal washings and aspirates are unacceptable for Xpert Xpress  SARS-CoV-2/FLU/RSV testing.  Fact Sheet for Patients: EntrepreneurPulse.com.au  Fact Sheet for Healthcare Providers: IncredibleEmployment.be  This test is not yet approved or cleared by the [place] FDA and has been authorized for detection and/or diagnosis of SARS-CoV-2 by FDA under an Emergency Use Authorization (EUA). This EUA will remain in effect (meaning this test can be used) for the duration of the COVID-19 declaration under Section 564(b)(1) of the Act, 21 U.S.C. section 360bbb-3(b)(1), unless the authorization is terminated or revoked.     Resp Syncytial Virus by PCR NEGATIVE NEGATIVE Final    Comment: (NOTE) Fact Sheet for Patients: EntrepreneurPulse.com.au  Fact Sheet for Healthcare Providers: IncredibleEmployment.be  This test is not yet approved or cleared by the [place] FDA and has been authorized for detection and/or diagnosis of SARS-CoV-2 by FDA under an Emergency Use Authorization (EUA). This EUA will remain in effect (meaning this test can be used) for the duration of the COVID-19 declaration under Section 564(b)(1) of the Act, 21 U.S.C. section  360bbb-3(b)(1), unless the authorization is terminated or revoked.  Performed at [facility], [place] [address] [place], [place] [address]          Radiology Studies: DG CHEST PORT 1 VIEW  Result Date: 10/04/2022 CLINICAL DATA:  Cough, influenza B. EXAM: PORTABLE CHEST 1 VIEW COMPARISON:  None Available. FINDINGS: The heart size and mediastinal contours are within normal limits. Lung volumes are low with mild atelectasis at the left lung base. No effusion or pneumothorax. A loop recorder device is present over the left chest. No acute osseous abnormality. IMPRESSION: Low lung volumes with mild atelectasis at the right lung base. Electronically Signed   By: Brett Fairy M.D.   On: 10/04/2022 00:06        Scheduled  Meds:  allopurinol  300 mg Oral BID   aspirin EC  81 mg Oral Daily   enoxaparin (LOVENOX) injection  40 mg Subcutaneous Q24H   fenofibrate  160 mg Oral Daily   oseltamivir  30 mg Oral BID   rosuvastatin  40 mg Oral Daily   Continuous Infusions:  sodium chloride     piperacillin-tazobactam (ZOSYN)  IV Stopped (10/04/22 0506)     LOS: 0 days    Time spent: 52 minutes spent on chart review, discussion with nursing staff, consultants, updating family and interview/physical exam; more than 50% of that time was spent in counseling and/or coordination of care.    Shamiya Demeritt J [place] DO [facility] Available via Epic secure chat 7am-7pm After these hours, please refer to coverage provider listed on [email or web address] 10/04/2022, 10:10 AM

## 2022-10-04 NOTE — ED Notes (Signed)
MD [place] notified of softer BP.

## 2022-10-04 NOTE — ED Notes (Signed)
Per MD [place] she will dc pt's BP meds for now until BP improves.

## 2022-10-05 DIAGNOSIS — K5792 Diverticulitis of intestine, part unspecified, without perforation or abscess without bleeding: Secondary | ICD-10-CM | POA: Diagnosis not present

## 2022-10-05 LAB — BASIC METABOLIC PANEL
Anion gap: 6 (ref 5–15)
BUN: 7 mg/dL — ABNORMAL LOW (ref 8–23)
CO2: 24 mmol/L (ref 22–32)
Calcium: 8.8 mg/dL — ABNORMAL LOW (ref 8.9–10.3)
Chloride: 107 mmol/L (ref 98–111)
Creatinine, Ser: 1.06 mg/dL — ABNORMAL HIGH (ref 0.44–1.00)
GFR, Estimated: 57 mL/min — ABNORMAL LOW (ref >60)
Glucose, Bld: 88 mg/dL (ref 70–99)
Potassium: 3.3 mmol/L — ABNORMAL LOW (ref 3.5–5.1)
Sodium: 137 mmol/L (ref 135–145)

## 2022-10-05 LAB — CBC
HCT: 31.5 % — ABNORMAL LOW (ref 36.0–46.0)
Hemoglobin: 10.4 g/dL — ABNORMAL LOW (ref 12.0–15.0)
MCH: 32.1 pg (ref 26.0–34.0)
MCHC: 33 g/dL (ref 30.0–36.0)
MCV: 97.2 fL (ref 80.0–100.0)
Platelets: 121 10*3/uL — ABNORMAL LOW (ref 150–400)
RBC: 3.24 MIL/uL — ABNORMAL LOW (ref 3.87–5.11)
RDW: 13.1 % (ref 11.5–15.5)
WBC: 4.5 10*3/uL (ref 4.0–10.5)
nRBC: 0 % (ref 0.0–0.2)

## 2022-10-05 LAB — MAGNESIUM: Magnesium: 1.6 mg/dL — ABNORMAL LOW (ref 1.7–2.4)

## 2022-10-05 MED ORDER — MELATONIN 5 MG PO TABS
5.0000 mg | ORAL_TABLET | Freq: Every evening | ORAL | Status: DC | PRN
  Administered 2022-10-06: 5 mg via ORAL
  Filled 2022-10-05: qty 1

## 2022-10-05 MED ORDER — POTASSIUM CHLORIDE CRYS ER 20 MEQ PO TBCR
40.0000 meq | EXTENDED_RELEASE_TABLET | ORAL | Status: AC
  Administered 2022-10-05 (×2): 40 meq via ORAL
  Filled 2022-10-05 (×2): qty 2

## 2022-10-05 MED ORDER — MAGNESIUM SULFATE 2 GM/50ML IV SOLN
2.0000 g | Freq: Once | INTRAVENOUS | Status: AC
  Administered 2022-10-05: 2 g via INTRAVENOUS
  Filled 2022-10-05: qty 50

## 2022-10-05 NOTE — Telephone Encounter (Signed)
Tried to contact pt to inform her of the below information and it looks like pt is currently admitted. Could not leave a message due to phone only having a busy signal. Azam Gervasi Katharina Caper, CMA

## 2022-10-05 NOTE — Evaluation (Signed)
Physical Therapy Evaluation  Patient Details Name: Rachel Black MRN: [id number] DOB: [birth year] Today's Date: 10/05/2022  History of Present Illness  Pt is a 72 y/o female presenting on 1/20 with abdominal, hip, and LE pain. Recently seen in ED 1/16 and 1/18.  Admitted with acute diverticulitis, flu B. PMH includes: arthritis, DM, gout, PFO, CVA, HTN, ERCP, TKA bil.   Clinical Impression  Pt admitted with above diagnosis. Pt currently with functional limitations due to the deficits listed below (see PT Problem List). At the time of PT eval pt was able to perform transfers and ambulation with gross min guard assist and RW for support. Anticipate pt will progress well, however may benefit from outpatient PT follow up to address L hip pain and B knee pain, as well as improve tolerance for functional activity and decrease risk for falls. Pt will benefit from skilled PT to increase their independence and safety with mobility to allow discharge to the venue listed below.          Recommendations for follow up therapy are one component of a multi-disciplinary discharge planning process, led by the attending physician.  Recommendations may be updated based on patient status, additional functional criteria and insurance authorization.  Follow Up Recommendations Outpatient PT      Assistance Recommended at Discharge Intermittent Supervision/Assistance  Patient can return home with the following  A little help with walking and/or transfers;A little help with bathing/dressing/bathroom;Assistance with cooking/housework;Assist for transportation;Help with stairs or ramp for entrance    Equipment Recommendations None recommended by PT  Recommendations for Other Services       Functional Status Assessment Patient has had a recent decline in their functional status and demonstrates the ability to make significant improvements in function in a reasonable and predictable amount of time.     Precautions  / Restrictions Precautions Precautions: Fall Precaution Comments: droplet- flu Restrictions Weight Bearing Restrictions: No      Mobility  Bed Mobility Overal bed mobility: Modified Independent             General bed mobility comments: no assist required, HOB flat    Transfers Overall transfer level: Needs assistance Equipment used: Rolling walker (2 wheels) Transfers: Sit to/from Stand Sit to Stand: Min guard           General transfer comment: Close guard for safety as pt powered up to full stand. VC's for hand placement on seated surface for safety.    Ambulation/Gait Ambulation/Gait assistance: Min guard Gait Distance (Feet): 75 Feet Assistive device: Rolling walker (2 wheels) Gait Pattern/deviations: Step-through pattern, Decreased stride length, Trunk flexed Gait velocity: Decreased Gait velocity interpretation: <1.31 ft/sec, indicative of household ambulator   General Gait Details: Distance limited by knee pain. Pt fatigues quickly as well. No overt LOB noted but states the walker is helpful.  Stairs            Wheelchair Mobility    Modified Rankin (Stroke Patients Only)       Balance Overall balance assessment: Mild deficits observed, not formally tested (preferece to UE support)                                           Pertinent Vitals/Pain Pain Assessment Pain Assessment: Faces Faces Pain Scale: Hurts little more Pain Location: knees Pain Descriptors / Indicators: Discomfort, Sore Pain Intervention(s): Limited activity within patient's tolerance,  Monitored during session, Repositioned    Home Living Family/patient expects to be discharged to:: Private residence Living Arrangements: Spouse/significant other Available Help at Discharge: Family;Available PRN/intermittently Type of Home: House Home Access: Stairs to enter Entrance Stairs-Rails: None Entrance Stairs-Number of Steps: 2   Home Layout: One  level Home Equipment: Conservation officer, nature (2 wheels);Shower seat;Grab bars - tub/shower;Grab bars - toilet;Rollator (4 wheels)      Prior Function Prior Level of Function : Independent/Modified Independent             Mobility Comments: using rolling walker at home ADLs Comments: independent ADLs, light IADLs and managing meds.  pt does not drive     Hand Dominance   Dominant Hand: Right    Extremity/Trunk Assessment   Upper Extremity Assessment Upper Extremity Assessment: Defer to OT evaluation    Lower Extremity Assessment Lower Extremity Assessment: RLE deficits/detail;LLE deficits/detail RLE Deficits / Details: Pt reports baseline knee pain bilaterally    Cervical / Trunk Assessment Cervical / Trunk Assessment: Other exceptions Cervical / Trunk Exceptions: Forward head posture with rounded shoulders  Communication   Communication: No difficulties  Cognition Arousal/Alertness: Awake/alert Behavior During Therapy: WFL for tasks assessed/performed Overall Cognitive Status: Within Functional Limits for tasks assessed                                          General Comments      Exercises     Assessment/Plan    PT Assessment Patient needs continued PT services  PT Problem List Decreased strength;Decreased range of motion;Decreased activity tolerance;Decreased balance;Decreased mobility;Decreased knowledge of use of DME;Decreased safety awareness;Decreased knowledge of precautions;Pain       PT Treatment Interventions DME instruction;Gait training;Stair training;Functional mobility training;Therapeutic activities;Therapeutic exercise;Balance training;Patient/family education    PT Goals (Current goals can be found in the Care Plan section)  Acute Rehab PT Goals Patient Stated Goal: Return home at d/c PT Goal Formulation: With patient Time For Goal Achievement: 10/12/22 Potential to Achieve Goals: Good    Frequency Min 3X/week      Co-evaluation               AM-PAC PT "6 Clicks" Mobility  Outcome Measure Help needed turning from your back to your side while in a flat bed without using bedrails?: None Help needed moving from lying on your back to sitting on the side of a flat bed without using bedrails?: None Help needed moving to and from a bed to a chair (including a wheelchair)?: A Little Help needed standing up from a chair using your arms (e.g., wheelchair or bedside chair)?: A Little Help needed to walk in hospital room?: A Little Help needed climbing 3-5 steps with a railing? : A Little 6 Click Score: 20    End of Session Equipment Utilized During Treatment: Gait belt Activity Tolerance: Patient tolerated treatment well Patient left: in bed;with call bell/phone within reach;with bed alarm set Nurse Communication: Mobility status PT Visit Diagnosis: Unsteadiness on feet (R26.81);Pain Pain - Right/Left:  (bilateral) Pain - part of body: Knee    Time: 0093-8182 PT Time Calculation (min) (ACUTE ONLY): 15 min   Charges:   PT Evaluation $PT Eval Moderate Complexity: 1 Mod          Rolinda Roan, PT, DPT Acute Rehabilitation Services Secure Chat Preferred Office: [phone]   Thelma Comp 10/05/2022, 4:12 PM

## 2022-10-05 NOTE — TOC Initial Note (Signed)
Transition of Care [facility]) - Initial/Assessment Note    Patient Details  Name: Rachel Black MRN: [id number] Date of Birth: 07-Apr-1951  Transition of Care [facility]) CM/SW Contact:    Ninfa Meeker, RN Phone Number: 10/05/2022, 9:46 AM  Clinical Narrative:                  Transition of Care Screening Note:Transition of Care [facility]) Department has reviewed patient and no TOC needs have been identified at this time. We will continue to monitor patient advancement through Interdisciplinary progressions and if new patient needs arise, please place a consult.        Patient Goals and CMS Choice            Expected Discharge Plan and Services                                              Prior Living Arrangements/Services                       Activities of Daily Living Home Assistive Devices/Equipment: Gilford Rile (specify type) ADL Screening (condition at time of admission) Patient's cognitive ability adequate to safely complete daily activities?: Yes Is the patient deaf or have difficulty hearing?: No Does the patient have difficulty seeing, even when wearing glasses/contacts?: Yes Does the patient have difficulty concentrating, remembering, or making decisions?: Yes Patient able to express need for assistance with ADLs?: Yes Does the patient have difficulty dressing or bathing?: Yes Independently performs ADLs?: Yes (appropriate for developmental age) Does the patient have difficulty walking or climbing stairs?: Yes Weakness of Legs: Both Weakness of Arms/Hands: Both  Permission Sought/Granted                  Emotional Assessment              Admission diagnosis:  Diverticulitis [K57.92] Influenza B [J10.1] AKI (acute kidney injury) ([place]) [N17.9] Acute diverticulitis [K57.92] Patient Active Problem List   Diagnosis Date Noted   Left hip pain 10/04/2022   Bilateral knee pain 10/04/2022   Acute diverticulitis 10/03/2022   Hypertension  associated with diabetes ([place]) 03/23/2022   Epigastric pain 03/16/2022   Constipation 03/16/2022   Influenza B 09/16/2021   Acute cystitis without hematuria 09/16/2021   Closed nondisplaced fracture of metatarsal bone of right foot, initial encounter 97/98/9211   Acute metabolic encephalopathy 94/17/4081   Cryptogenic stroke ([place]) 01/13/2021   GERD without esophagitis 11/26/2020   Iron deficiency 11/26/2020   Generalized abdominal pain 11/26/2020   Pneumonia due to COVID-19 virus    Mixed hyperlipidemia due to type 2 diabetes mellitus ([place]) 11/27/2019   Family history of breast cancer in mother 11/27/2019   Cataract of both eyes 11/27/2019   Coronary artery disease involving native coronary artery of native heart without angina pectoris 02/18/2018   NSTEMI (non-ST elevated myocardial infarction) [facility])    Chest pain of uncertain etiology 44/81/8563   Need for shingles vaccine 09/21/2017   Need for pneumococcal vaccine 09/21/2017   History of rheumatic fever 06/30/2017   PFO (patent foramen ovale)    Cerebrovascular accident (CVA) due to embolism of right middle cerebral artery ([place]) 03/14/2017   Dilation of biliary tract    Elevated LFTs    RUQ abdominal pain    Acute gallstone pancreatitis 12/07/2016   Choledocholithiasis with obstruction 12/07/2016  AKI (acute kidney injury) ([place]) 12/07/2016   Osteoarthritis of right knee 08/23/2015   Status post total left knee replacement 08/23/2015   Arthritis of knee, right 04/20/2014   Status post total right knee replacement 04/20/2014   Flushing reaction 03/10/2012   Diabetes mellitus ([place]) 02/17/2007   Mixed hyperlipidemia 02/17/2007   GOUT 02/17/2007   Severe obesity (BMI >= 40) ([place]) 02/17/2007   Essential hypertension 02/17/2007   RHEUMATIC FEVER, HX OF 02/17/2007   PCP:  Lorrene Reid, PA-C Pharmacy:   [id number] [place] [place] [address] [place] [address]Phone: [phone]:  [phone]    Social Determinants of Health (SDOH) Social History: SDOH Screenings   Depression (PHQ2-9): Medium Risk (09/28/2022)  Tobacco Use: Low Risk  (10/03/2022)   SDOH Interventions:     Readmission Risk Interventions     No data to display

## 2022-10-05 NOTE — Plan of Care (Signed)
  Problem: Pain Managment: Goal: General experience of comfort will improve Outcome: Progressing   Problem: Safety: Goal: Ability to remain free from injury will improve Outcome: Progressing   Problem: Skin Integrity: Goal: Risk for impaired skin integrity will decrease Outcome: Progressing

## 2022-10-05 NOTE — Progress Notes (Signed)
PROGRESS NOTE    Rachel Black  OZD:[id number] DOB: 04/12/1951 DOA: 10/03/2022 PCP: Lorrene Reid, PA-C    Brief Narrative:   Rachel Black is a 72 y.o. female with past medical history significant for CAD, HTN, HLD, PFO, history of CVA, DM2, CKD stage IIIa, GERD, gout, osteoarthritis s/p bilateral knee replacement who presents to [facility] ED on 1/20 with continued abdominal, hip and lower extremity pain.  Patient reports left lower quadrant pain, ongoing for several days.  Patient also endorses chronic constipation in which she takes medications and eats prunes for this.  Also reports coughing over the last few days.  Denies shortness of breath, no fever, no chest pain, no palpitation.  Recently seen in the ED on 1/16 for abdominal pain and left hip pain.  UA was concerning for infection.  X-rays of lumbar spine/pelvis/left hip negative for acute finding.  She was discharged on Omnicef and Flagyl for UTI and possible colitis.  No urine culture sent.  She returned to the ED on 1/18 complaining of ongoing abdominal pain and also bilateral knee pain. CT abdomen pelvis with contrast showing possible mild or early diverticulitis of the sigmoid colon and no complicating features such as perforation or abscess. Bilateral knee x-rays negative for acute finding.   In the ED, temperature 99.3 F, HR 88, RR 18, BP 110/68, SpO2 100% on room air.  WBC 6.0, hemoglobin 12.4, platelets 165.  Sodium 136, potassium 3.6, chloride 105, CO2 25, glucose 97, BUN 17, creatinine 1.33.  AST 34, ALT 25, total bilirubin less than 0.1.  COVID/RSV PCR negative.  Influenza B PCR positive.  Urinalysis unrevealing.  Patient was given Zosyn, morphine, Tamiflu and 1 L NS bolus.  TRH consulted for admission for further evaluation management of diverticulitis and influenza B viral infection.  Assessment & Plan:   Acute diverticulitis Patient presenting to ED with persistent left lower quadrant pain.  Recent CT Abd/pelvis notable for  mild/early diverticulitis of the sigmoid colon with no complicating features such as perforation or abscess.  Lipase 51.  Patient is afebrile without leukocytosis. --Continue Zosyn 3.375 g IV every 8 hours -- IVF with NS at 75 mL/h -- Clear liquid diet, will advance as tolerates -- Supportive care, pain control, antiemetics  Influenza B viral infection Chest x-ray with low lung volumes with mild atelectasis right lung base.  No consolidation. -- Tamiflu 75 mg p.o. twice daily -- Incentive spirometry -- Droplet precautions -- Supportive care  Left hip pain Bilateral knee pain Hx osteoarthritis s/p bilateral TKA X-rays negative for fracture or dislocation.  No falls reported. -- Seen by OT with no recommendations --PT evaluation: Pending -- Patient follow-up with orthopedics  CAD -- Continue aspirin and statin  Essential hypertension On isosorbide and irbesartan outpatient.  Blood pressures borderline low on admission. -- Continue to hold home antihypertensives -- Continue monitor BP closely  Dyslipidemia --Crestor 40 mg p.o. daily --Fenofibrate 160 mg p.o. daily  History of CVA -- Continue aspirin and statin  Type 2 diabetes mellitus Diet controlled at baseline.  Hemoglobin A1c 5.6, well-controlled.  CKD stage IIIa --Cr 1.33>1.17>1.06; stable and at baseline -- Avoid nephrotoxins, renal dose all medications -- BMP in a.m.  Hypokalemia Hypomagnesemia Potassium 3.3, magnesium 1.6 this morning, will replete. -- Repeat electrolytes in the a.m.  Gout  Stable.  Patient is taking allopurinol 300 mg twice daily but it seems to be a high dose considering her chronic kidney disease.  I spoke to pharmacist and he confirmed  that this is the patient's current outpatient dose, last prescribed in December for 77-monthsupply. -Will need close follow-up with PCP for consideration of dose adjustment.   DVT prophylaxis: enoxaparin (LOVENOX) injection 40 mg Start: 10/04/22 0800     Code Status: Full Code Family Communication: Updated spouse present at bedside this morning  Disposition Plan:  Level of care: Med-Surg Status is: Inpatient Remains inpatient appropriate because: IV antibiotics, IV fluids, awaiting PT evaluation, anticipate discharge home in 1-2 days once diet advanced.   Consultants:  None  Procedures:  None  Antimicrobials:  Zosyn 1/20>>   Subjective: Patient seen examined bedside, resting comfortably.  Lying in bed.  Spouse present.  Continues to complain of some nausea.  Tolerating clear liquid diet.  States she does not want to further advance as of yet.  Remains on IV antibiotics.  No other specific complaints or concerns at this time.  Denies headache, no dizziness, no chest pain, palpitations, no shortness of breath, no fever/chills/night sweats, no nausea/vomiting/diarrhea, no focal weakness, no cough/congestion, no fatigue, no paresthesias.  No acute events overnight per nursing staff.  Objective: Vitals:   10/04/22 1534 10/04/22 2042 10/05/22 0504 10/05/22 0736  BP: (!) 123/55 (!) 140/62 139/71 (!) 123/30  Pulse: (!) 54 60 66 60  Resp: '16  17 16  '$ Temp: 98.7 F (37.1 C) 98.7 F (37.1 C) 99 F (37.2 C) 98.2 F (36.8 C)  TempSrc: Oral Oral Oral Oral  SpO2: 100% 99% 96% 99%    Intake/Output Summary (Last 24 hours) at 10/05/2022 1055 Last data filed at 10/05/2022 0654 Gross per 24 hour  Intake 1563.25 ml  Output --  Net 1563.25 ml   There were no vitals filed for this visit.  Examination:  Physical Exam: GEN: NAD, alert and oriented x 3, wd/wn HEENT: NCAT, PERRL, EOMI, sclera clear, MMM PULM: CTAB w/o wheezes/crackles, normal respiratory effort, on room air CV: RRR w/o M/G/R GI: abd soft, mild tenderness left lower quadrant, nondistended, NABS, no R/G/M MSK: no peripheral edema, muscle strength globally intact 5/5 bilateral upper/lower extremities NEURO: CN II-XII intact, no focal deficits, sensation to light touch  intact PSYCH: normal mood/affect Integumentary: dry/intact, no rashes or wounds    Data Reviewed: I have personally reviewed following labs and imaging studies  CBC: Recent Labs  Lab 09/29/22 1615 10/01/22 1718 10/03/22 1714 10/04/22 0516 10/05/22 0209  WBC 7.3 6.5 6.0 4.7 4.5  NEUTROABS 4.5 3.9 4.6  --   --   HGB 12.5 12.3 12.4 10.6* 10.4*  HCT 36.8 36.4 36.6 31.8* 31.5*  MCV 95.3 95.3 96.1 95.8 97.2  PLT 206 194 165 131* 1673   Basic Metabolic Panel: Recent Labs  Lab 09/29/22 1615 10/01/22 1718 10/03/22 1714 10/04/22 0516 10/05/22 0209  NA 138 139 136 137 137  K 4.4 3.8 3.6 3.4* 3.3*  CL 104 107 105 108 107  CO2 '24 23 25 22 24  '$ GLUCOSE 92 90 97 91 88  BUN '18 19 17 12 '$ 7*  CREATININE 1.14* 1.10* 1.33* 1.17* 1.06*  CALCIUM 9.9 9.9 9.7 8.9 8.8*  MG  --   --   --   --  1.6*   GFR: Estimated Creatinine Clearance: 53.5 mL/min (A) (by C-G formula based on SCr of 1.06 mg/dL (H)). Liver Function Tests: Recent Labs  Lab 09/29/22 1615 10/01/22 1718 10/03/22 1714  AST 21 24 34  ALT '18 17 25  '$ ALKPHOS 44 45 42  BILITOT 0.4 0.6 <0.1*  PROT 6.2* 6.2* 6.1*  ALBUMIN 3.9 3.9 3.9   Recent Labs  Lab 09/29/22 1615 10/01/22 1718  LIPASE 54* 51   No results for input(s): "AMMONIA" in the last 168 hours. Coagulation Profile: No results for input(s): "INR", "PROTIME" in the last 168 hours. Cardiac Enzymes: No results for input(s): "CKTOTAL", "CKMB", "CKMBINDEX", "TROPONINI" in the last 168 hours. BNP (last 3 results) No results for input(s): "PROBNP" in the last 8760 hours. HbA1C: No results for input(s): "HGBA1C" in the last 72 hours. CBG: Recent Labs  Lab 10/04/22 0822  GLUCAP 97   Lipid Profile: No results for input(s): "CHOL", "HDL", "LDLCALC", "TRIG", "CHOLHDL", "LDLDIRECT" in the last 72 hours. Thyroid Function Tests: No results for input(s): "TSH", "T4TOTAL", "FREET4", "T3FREE", "THYROIDAB" in the last 72 hours. Anemia Panel: No results for input(s):  "VITAMINB12", "FOLATE", "FERRITIN", "TIBC", "IRON", "RETICCTPCT" in the last 72 hours. Sepsis Labs: No results for input(s): "PROCALCITON", "LATICACIDVEN" in the last 168 hours.  Recent Results (from the past 240 hour(s))  Resp panel by RT-PCR (RSV, Flu A&B, Covid) Anterior Nasal Swab     Status: Abnormal   Collection Time: 10/03/22  6:52 PM   Specimen: Anterior Nasal Swab  Result Value Ref Range Status   SARS Coronavirus 2 by RT PCR NEGATIVE NEGATIVE Final    Comment: (NOTE) SARS-CoV-2 target nucleic acids are NOT DETECTED.  The SARS-CoV-2 RNA is generally detectable in upper respiratory specimens during the acute phase of infection. The lowest concentration of SARS-CoV-2 viral copies this assay can detect is 138 copies/mL. A negative result does not preclude SARS-Cov-2 infection and should not be used as the sole basis for treatment or other patient management decisions. A negative result may occur with  improper specimen collection/handling, submission of specimen other than nasopharyngeal swab, presence of viral mutation(s) within the areas targeted by this assay, and inadequate number of viral copies(<138 copies/mL). A negative result must be combined with clinical observations, patient history, and epidemiological information. The expected result is Negative.  Fact Sheet for Patients:  EntrepreneurPulse.com.au  Fact Sheet for Healthcare Providers:  IncredibleEmployment.be  This test is no t yet approved or cleared by the [place] FDA and  has been authorized for detection and/or diagnosis of SARS-CoV-2 by FDA under an Emergency Use Authorization (EUA). This EUA will remain  in effect (meaning this test can be used) for the duration of the COVID-19 declaration under Section 564(b)(1) of the Act, 21 U.S.C.section 360bbb-3(b)(1), unless the authorization is terminated  or revoked sooner.       Influenza A by PCR NEGATIVE NEGATIVE  Final   Influenza B by PCR POSITIVE (A) NEGATIVE Final    Comment: (NOTE) The Xpert Xpress SARS-CoV-2/FLU/RSV plus assay is intended as an aid in the diagnosis of influenza from Nasopharyngeal swab specimens and should not be used as a sole basis for treatment. Nasal washings and aspirates are unacceptable for Xpert Xpress SARS-CoV-2/FLU/RSV testing.  Fact Sheet for Patients: EntrepreneurPulse.com.au  Fact Sheet for Healthcare Providers: IncredibleEmployment.be  This test is not yet approved or cleared by the [place] FDA and has been authorized for detection and/or diagnosis of SARS-CoV-2 by FDA under an Emergency Use Authorization (EUA). This EUA will remain in effect (meaning this test can be used) for the duration of the COVID-19 declaration under Section 564(b)(1) of the Act, 21 U.S.C. section 360bbb-3(b)(1), unless the authorization is terminated or revoked.     Resp Syncytial Virus by PCR NEGATIVE NEGATIVE Final    Comment: (NOTE) Fact Sheet for Patients: EntrepreneurPulse.com.au  Fact Sheet for Healthcare Providers: IncredibleEmployment.be  This test is not yet approved or cleared by the [place] FDA and has been authorized for detection and/or diagnosis of SARS-CoV-2 by FDA under an Emergency Use Authorization (EUA). This EUA will remain in effect (meaning this test can be used) for the duration of the COVID-19 declaration under Section 564(b)(1) of the Act, 21 U.S.C. section 360bbb-3(b)(1), unless the authorization is terminated or revoked.  Performed at [facility], [place] [address], [place], [place] [address]          Radiology Studies: DG CHEST PORT 1 VIEW  Result Date: 10/04/2022 CLINICAL DATA:  Cough, influenza B. EXAM: PORTABLE CHEST 1 VIEW COMPARISON:  None Available. FINDINGS: The heart size and mediastinal contours are within normal limits. Lung volumes are low  with mild atelectasis at the left lung base. No effusion or pneumothorax. A loop recorder device is present over the left chest. No acute osseous abnormality. IMPRESSION: Low lung volumes with mild atelectasis at the right lung base. Electronically Signed   By: Brett Fairy M.D.   On: 10/04/2022 00:06        Scheduled Meds:  allopurinol  300 mg Oral BID   aspirin EC  81 mg Oral Daily   enoxaparin (LOVENOX) injection  40 mg Subcutaneous Q24H   fenofibrate  160 mg Oral Daily   oseltamivir  30 mg Oral BID   potassium chloride  40 mEq Oral Q3H   rosuvastatin  40 mg Oral Daily   Continuous Infusions:  sodium chloride 75 mL/hr at 10/05/22 0654   piperacillin-tazobactam (ZOSYN)  IV 3.375 g (10/05/22 0846)     LOS: 1 day    Time spent: 52 minutes spent on chart review, discussion with nursing staff, consultants, updating family and interview/physical exam; more than 50% of that time was spent in counseling and/or coordination of care.    Lurena Naeve J [place] DO [facility] Available via Epic secure chat 7am-7pm After these hours, please refer to coverage provider listed on [email or web address] 10/05/2022, 10:55 AM

## 2022-10-05 NOTE — Evaluation (Signed)
Occupational Therapy Evaluation Patient Details Name: Rachel Black MRN: [id number] DOB: [birth year] Today's Date: 10/05/2022   History of Present Illness Pt is a 72 y/o female presenting on 1/20 with abdominal, hip, and LE pain. Recently seen in ED 1/16 and 1/18.  Admitted with acute diverticulitis, flu B. PMH includes: arthritis, DM, gout, PFO, CVA, HTN, ERCP, TKA bil.   Clinical Impression   PTA patient independent with ADLs, using RW for mobility. Admitted for above and limited by problem list below.  She currently requires min guard for transfers and mobility using IV pole, ADLs with up to min guard assist.  Limited by abdominal pain, decreased activity tolerance, generalized weakness. Believe she will progress well and anticipate no further needs after dc home.  Will follow acutely.      Recommendations for follow up therapy are one component of a multi-disciplinary discharge planning process, led by the attending physician.  Recommendations may be updated based on patient status, additional functional criteria and insurance authorization.   Follow Up Recommendations  No OT follow up     Assistance Recommended at Discharge Intermittent Supervision/Assistance  Patient can return home with the following A little help with walking and/or transfers;A little help with bathing/dressing/bathroom;Assistance with cooking/housework;Help with stairs or ramp for entrance;Assist for transportation    Functional Status Assessment  Patient has had a recent decline in their functional status and demonstrates the ability to make significant improvements in function in a reasonable and predictable amount of time.  Equipment Recommendations  BSC/3in1    Recommendations for Other Services       Precautions / Restrictions Precautions Precautions: Fall Precaution Comments: droplet- flu Restrictions Weight Bearing Restrictions: No      Mobility Bed Mobility Overal bed mobility: Modified  Independent             General bed mobility comments: no assist required, HOB flat    Transfers Overall transfer level: Needs assistance Equipment used: None Transfers: Sit to/from Stand Sit to Stand: Min guard                  Balance Overall balance assessment: Mild deficits observed, not formally tested (preferece to UE support)                                         ADL either performed or assessed with clinical judgement   ADL Overall ADL's : Needs assistance/impaired     Grooming: Min guard;Standing;Wash/dry hands           Upper Body Dressing : Set up;Sitting   Lower Body Dressing: Min guard;Sit to/from stand   Toilet Transfer: Min guard;Ambulation Toilet Transfer Details (indicate cue type and reason): IV pole Toileting- Clothing Manipulation and Hygiene: Supervision/safety;Sitting/lateral lean       Functional mobility during ADLs: Min guard       Vision Baseline Vision/History: 1 Wears glasses (reading) Vision Assessment?: No apparent visual deficits     Perception     Praxis      Pertinent Vitals/Pain Pain Assessment Pain Assessment: Faces Faces Pain Scale: Hurts a little bit Pain Location: stomach Pain Descriptors / Indicators: Discomfort Pain Intervention(s): Monitored during session, Limited activity within patient's tolerance, Repositioned     Hand Dominance Right   Extremity/Trunk Assessment Upper Extremity Assessment Upper Extremity Assessment: Overall WFL for tasks assessed   Lower Extremity Assessment Lower Extremity Assessment: Defer to PT  evaluation       Communication Communication Communication: No difficulties   Cognition Arousal/Alertness: Awake/alert Behavior During Therapy: WFL for tasks assessed/performed Overall Cognitive Status: Within Functional Limits for tasks assessed                                       General Comments  spouse present and supportive     Exercises     Shoulder Instructions      Home Living Family/patient expects to be discharged to:: Private residence Living Arrangements: Spouse/significant other Available Help at Discharge: Family;Available PRN/intermittently Type of Home: House Home Access: Stairs to enter [employer] of Steps: 2 Entrance Stairs-Rails: None Home Layout: One level     Bathroom Shower/Tub: Occupational psychologist: Standard     Home Equipment: Conservation officer, nature (2 wheels);Shower seat;Grab bars - tub/shower;Grab bars - toilet;Rollator (4 wheels)          Prior Functioning/Environment Prior Level of Function : Independent/Modified Independent             Mobility Comments: using rolling walker at home ADLs Comments: independent ADLs, light IADLs and managing meds.  pt does not drive        OT Problem List: Decreased strength;Decreased activity tolerance;Impaired balance (sitting and/or standing);Decreased knowledge of use of DME or AE;Decreased knowledge of precautions;Pain      OT Treatment/Interventions: Self-care/ADL training;Therapeutic exercise;DME and/or AE instruction;Balance training;Patient/family education;Therapeutic activities;Energy conservation    OT Goals(Current goals can be found in the care plan section) Acute Rehab OT Goals Patient Stated Goal: home OT Goal Formulation: With patient Time For Goal Achievement: 10/19/22 Potential to Achieve Goals: Good  OT Frequency: Min 2X/week    Co-evaluation              AM-PAC OT "6 Clicks" Daily Activity     Outcome Measure Help from another person eating meals?: None Help from another person taking care of personal grooming?: A Little Help from another person toileting, which includes using toliet, bedpan, or urinal?: A Little Help from another person bathing (including washing, rinsing, drying)?: A Little Help from another person to put on and taking off regular upper body clothing?: A  Little Help from another person to put on and taking off regular lower body clothing?: A Little 6 Click Score: 19   End of Session Nurse Communication: Mobility status (+BM)  Activity Tolerance: Patient tolerated treatment well Patient left: with call bell/phone within reach;in bed;with family/visitor present  OT Visit Diagnosis: Other abnormalities of gait and mobility (R26.89);Muscle weakness (generalized) (M62.81);Pain Pain - part of body:  (abd)                Time: 0950-1008 OT Time Calculation (min): 18 min Charges:  OT General Charges $OT Visit: 1 Visit OT Evaluation $OT Eval Moderate Complexity: 1 Mod  Jolaine Artist, OT Acute Rehabilitation Services Office [phone]   Delight Stare 10/05/2022, 10:20 AM

## 2022-10-06 DIAGNOSIS — K5792 Diverticulitis of intestine, part unspecified, without perforation or abscess without bleeding: Secondary | ICD-10-CM | POA: Diagnosis not present

## 2022-10-06 LAB — BASIC METABOLIC PANEL
Anion gap: 7 (ref 5–15)
BUN: 7 mg/dL — ABNORMAL LOW (ref 8–23)
CO2: 23 mmol/L (ref 22–32)
Calcium: 8.9 mg/dL (ref 8.9–10.3)
Chloride: 107 mmol/L (ref 98–111)
Creatinine, Ser: 1.09 mg/dL — ABNORMAL HIGH (ref 0.44–1.00)
GFR, Estimated: 55 mL/min — ABNORMAL LOW (ref >60)
Glucose, Bld: 87 mg/dL (ref 70–99)
Potassium: 3.8 mmol/L (ref 3.5–5.1)
Sodium: 137 mmol/L (ref 135–145)

## 2022-10-06 LAB — MAGNESIUM: Magnesium: 2 mg/dL (ref 1.7–2.4)

## 2022-10-06 MED ORDER — OSELTAMIVIR PHOSPHATE 30 MG PO CAPS: 30.0000 mg | ORAL_CAPSULE | Freq: Two times a day (BID) | ORAL | 0 refills | Status: AC

## 2022-10-06 MED ORDER — AMOXICILLIN-POT CLAVULANATE 875-125 MG PO TABS: 1.0000 | ORAL_TABLET | Freq: Two times a day (BID) | ORAL | 0 refills | Status: AC

## 2022-10-06 NOTE — Progress Notes (Signed)
Mobility Specialist - Progress Note   10/06/22 1100  Mobility  Activity Ambulated with assistance in hallway  Level of Assistance Standby assist, set-up cues, supervision of patient - no hands on  Assistive Device Front wheel walker  Distance Ambulated (ft) 80 ft  Activity Response Tolerated well  Mobility Referral Yes  $Mobility charge 1 Mobility    Pt received in bed agreeable to mobility. Distance limited by fatigue. Left sitting EOB w/ call bell in reach.   [place] Specialist Please contact via SecureChat or Rehab office at [phone]

## 2022-10-06 NOTE — Discharge Summary (Signed)
Physician Discharge Summary  Rachel Black VPX:[id number] DOB: 1951/07/24 DOA: 10/03/2022  PCP: Lorrene Reid, PA-C  Admit date: 10/03/2022 Discharge date: 10/06/2022  Admitted From: Home Disposition: Home  Recommendations for Outpatient Follow-up:  Follow up with PCP in 1-2 weeks Follow-up with gastroenterology, Dr. Henrene Pastor in 4 weeks Recommend patient follow-up with orthopedics for chronic osteoarthritis, hip/knee pain Continue antibiotics with Augmentin to complete 10-day course for acute diverticulitis Continue Tamiflu to complete 5-day course for influenza B infection  Home Health: Declined outpatient PT Equipment/Devices: None  Discharge Condition: Stable CODE STATUS: Full code Diet recommendation: Heart healthy/consistent carb regular diet  History of present illness:  Rachel Black is a 72 y.o. female with past medical history significant for CAD, HTN, HLD, PFO, history of CVA, DM2, CKD stage IIIa, GERD, gout, osteoarthritis s/p bilateral knee replacement who presents to [facility] ED on 1/20 with continued abdominal, hip and lower extremity pain.  Patient reports left lower quadrant pain, ongoing for several days.  Patient also endorses chronic constipation in which she takes medications and eats prunes for this.  Also reports coughing over the last few days.  Denies shortness of breath, no fever, no chest pain, no palpitation.   Recently seen in the ED on 1/16 for abdominal pain and left hip pain.  UA was concerning for infection.  X-rays of lumbar spine/pelvis/left hip negative for acute finding.  She was discharged on Omnicef and Flagyl for UTI and possible colitis.  No urine culture sent.  She returned to the ED on 1/18 complaining of ongoing abdominal pain and also bilateral knee pain. CT abdomen pelvis with contrast showing possible mild or early diverticulitis of the sigmoid colon and no complicating features such as perforation or abscess. Bilateral knee x-rays negative for  acute finding.    In the ED, temperature 99.3 F, HR 88, RR 18, BP 110/68, SpO2 100% on room air.  WBC 6.0, hemoglobin 12.4, platelets 165.  Sodium 136, potassium 3.6, chloride 105, CO2 25, glucose 97, BUN 17, creatinine 1.33.  AST 34, ALT 25, total bilirubin less than 0.1.  COVID/RSV PCR negative.  Influenza B PCR positive.  Urinalysis unrevealing.  Patient was given Zosyn, morphine, Tamiflu and 1 L NS bolus.  TRH consulted for admission for further evaluation management of diverticulitis and influenza B viral infection.  Hospital course:  Acute diverticulitis Patient presenting to ED with persistent left lower quadrant pain.  Recent CT Abd/pelvis notable for mild/early diverticulitis of the sigmoid colon with no complicating features such as perforation or abscess.  Lipase 51.  Patient is afebrile without leukocytosis.  Patient was started on IV Zosyn and IV fluid hydration.  She was initially started on a clear liquid diet and slowly advanced with toleration.  Will continue Augmentin to complete 10-day antibiotic course.  Recommend outpatient follow-up with GI, Dr. Henrene Pastor 4 weeks.   Influenza B viral infection Chest x-ray with low lung volumes with mild atelectasis right lung base.  No consolidation.  Patient started on Tamiflu and will continue to complete 5-day course on discharge.   Left hip pain Bilateral knee pain Hx osteoarthritis s/p bilateral TKA X-rays negative for fracture or dislocation.  No falls reported.  Seen by PT/OT with recommendation of outpatient PT but patient declined.  Recommend outpatient follow-up with orthopedics.   CAD Continue aspirin and statin   Essential hypertension On isosorbide and irbesartan outpatient.   Dyslipidemia Crestor 40 mg p.o. daily, Fenofibrate 160 mg p.o. daily   History of CVA Continue  aspirin and statin   Type 2 diabetes mellitus Diet controlled at baseline.  Hemoglobin A1c 5.6, well-controlled.   CKD stage IIIa Creatinine 1.33 at  time of admission, improved with IV fluid hydration with creatinine 1.09 at time of discharge.   Hypokalemia Hypomagnesemia Repleted during hospitalization.   Gout  Stable.  Patient is taking allopurinol 300 mg twice daily but it seems to be a high dose considering her chronic kidney disease.  Discussed with pharmacist and he confirmed that this is the patient's current outpatient dose, last prescribed in December for 52-monthsupply. Will need close follow-up with PCP for consideration of dose adjustment.  Discharge Diagnoses:  Principal Problem:   Acute diverticulitis Active Problems:   Influenza B   Essential hypertension   Coronary artery disease involving native coronary artery of native heart without angina pectoris   Mixed hyperlipidemia due to type 2 diabetes mellitus (HCC)   Left hip pain   Bilateral knee pain    Discharge Instructions  Discharge Instructions     Call MD for:  difficulty breathing, headache or visual disturbances   Complete by: As directed    Call MD for:  extreme fatigue   Complete by: As directed    Call MD for:  persistant dizziness or light-headedness   Complete by: As directed    Call MD for:  persistant nausea and vomiting   Complete by: As directed    Call MD for:  severe uncontrolled pain   Complete by: As directed    Call MD for:  temperature >100.4   Complete by: As directed    Diet - low sodium heart healthy   Complete by: As directed    Increase activity slowly   Complete by: As directed       Allergies as of 10/06/2022       Reactions   Other Other (See Comments)   Some BANDAIDS cause skin irritation    Penicillins Itching   Tolerated Zosyn couse 10/04/22        Medication List     STOP taking these medications    cefdinir 300 MG capsule Commonly known as: OMNICEF   metroNIDAZOLE 500 MG tablet Commonly known as: FLAGYL   ondansetron 4 MG tablet Commonly known as: ZOFRAN   oxyCODONE-acetaminophen 5-325 MG  tablet Commonly known as: PERCOCET/ROXICET   pantoprazole 40 MG tablet Commonly known as: PROTONIX   Vitamin D3 Super Strength 50 MCG (2000 UT) Tabs Generic drug: Cholecalciferol       TAKE these medications    Accu-Chek Aviva Plus test strip Generic drug: glucose blood USE AS INSTRUCTED ONCE A DAY. DX CODE E11.9   Accu-Chek Softclix Lancets lancets Use as instructed once a day.  DX Code: E11.9   allopurinol 300 MG tablet Commonly known as: ZYLOPRIM TAKE 1 TABLET BY MOUTH TWICE A DAY   amoxicillin-clavulanate 875-125 MG tablet Commonly known as: AUGMENTIN Take 1 tablet by mouth 2 (two) times daily for 13 doses.   aspirin EC 81 MG tablet Take 1 tablet (81 mg total) by mouth daily.   CALCIUM PO Take 2 tablets by mouth daily.   famotidine 10 MG tablet Commonly known as: PEPCID Take 10 mg by mouth daily as needed for heartburn or indigestion.   fenofibrate 160 MG tablet Take 1 tablet (160 mg total) by mouth daily.   ferrous sulfate 325 (65 FE) MG tablet TAKE 1 TABLET BY MOUTH TWICE A DAY What changed: when to take this   isosorbide mononitrate  60 MG 24 hr tablet Commonly known as: IMDUR Take 1 tablet (60 mg total) by mouth daily. What changed: how much to take   linaclotide 72 MCG capsule Commonly known as: Linzess Take 1 capsule (72 mcg total) by mouth daily before breakfast.   olmesartan 5 MG tablet Commonly known as: BENICAR TAKE 1 TABLET (5 MG TOTAL) BY MOUTH DAILY.   oseltamivir 30 MG capsule Commonly known as: Tamiflu Take 1 capsule (30 mg total) by mouth 2 (two) times daily for 3 doses.   rosuvastatin 40 MG tablet Commonly known as: CRESTOR Take 1 tablet (40 mg total) by mouth daily.   triamcinolone 0.025 % cream Commonly known as: KENALOG Apply 1 Application topically 2 (two) times daily.   Udderly Smooth Crea Apply 1 application topically See admin instructions. Apply topically to skin folds as needed for rash/irritation         Follow-up Information     Lorrene Reid, PA-C. Schedule an appointment as soon as possible for a visit in 1 week(s).   Specialty: Physician Assistant Contact information: [place] [address] [phone]         Irene Shipper, MD. Schedule an appointment as soon as possible for a visit in 4 week(s).   Specialty: Gastroenterology Contact information: [address] [address] [phone]                Allergies  Allergen Reactions   Other Other (See Comments)    Some BANDAIDS cause skin irritation    Penicillins Itching     Tolerated Zosyn couse 10/04/22    Consultations: None   Procedures/Studies: DG CHEST PORT 1 VIEW  Result Date: 10/04/2022 CLINICAL DATA:  Cough, influenza B. EXAM: PORTABLE CHEST 1 VIEW COMPARISON:  None Available. FINDINGS: The heart size and mediastinal contours are within normal limits. Lung volumes are low with mild atelectasis at the left lung base. No effusion or pneumothorax. A loop recorder device is present over the left chest. No acute osseous abnormality. IMPRESSION: Low lung volumes with mild atelectasis at the right lung base. Electronically Signed   By: Brett Fairy M.D.   On: 10/04/2022 00:06   CT ABDOMEN PELVIS W CONTRAST  Result Date: 10/02/2022 CLINICAL DATA:  Abdominal pain, acute, nonlocalized. EXAM: CT ABDOMEN AND PELVIS WITH CONTRAST TECHNIQUE: Multidetector CT imaging of the abdomen and pelvis was performed using the standard protocol following bolus administration of intravenous contrast. RADIATION DOSE REDUCTION: This exam was performed according to the departmental dose-optimization program which includes automated exposure control, adjustment of the mA and/or kV according to patient size and/or use of iterative reconstruction technique. CONTRAST:  67m OMNIPAQUE IOHEXOL 350 MG/ML SOLN COMPARISON:  03/16/2022. FINDINGS: Lower chest: No acute abnormality. Hepatobiliary: No focal  liver abnormality is seen. Status post cholecystectomy. No biliary dilatation. Pneumobilia is noted. Pancreas: Unremarkable. No pancreatic ductal dilatation or surrounding inflammatory changes. Spleen: Spleen is mildly enlarged measuring 14.0 cm. Adrenals/Urinary Tract: No adrenal nodule or mass. The kidneys enhance symmetrically. There is a cyst in the mid left kidney. No renal calculus or hydronephrosis. The bladder is unremarkable. Stomach/Bowel: There is a small hiatal hernia. Stomach is within normal limits. Appendix appears normal. No evidence of bowel wall thickening, distention, or inflammatory changes. No free air or pneumatosis. Colonic diverticulosis is noted. Subtle fat stranding is noted at the sigmoid colon in the left lower quadrant, possible mild or early diverticulitis. Vascular/Lymphatic: No significant vascular findings are present. No  enlarged abdominal or pelvic lymph nodes. Reproductive: Status post hysterectomy. No adnexal masses. Other: No abdominopelvic ascites. Musculoskeletal: Degenerative changes are present in the thoracolumbar spine. No acute osseous abnormality. IMPRESSION: 1. A sigmoid diverticulosis with subtle fat stranding in the left lower quadrant, possible mild or early diverticulitis. 2. Small hiatal hernia. 3. Mild splenomegaly. Electronically Signed   By: Brett Fairy M.D.   On: 10/02/2022 01:18   DG Knee Complete 4 Views Right  Result Date: 10/01/2022 CLINICAL DATA:  pain EXAM: RIGHT KNEE - COMPLETE 4+ VIEW COMPARISON:  None Available. FINDINGS: Status post total knee arthroplasty. No radiographic findings suggest surgical hardware complication. No evidence of fracture, dislocation, or joint effusion. No evidence of arthropathy or other focal bone abnormality. Soft tissues are unremarkable. IMPRESSION: No acute displaced fracture or dislocation. Status post total knee arthroplasty. No radiographic findings suggest surgical hardware complication. Electronically Signed    By: Iven Finn M.D.   On: 10/01/2022 18:16   DG Knee Complete 4 Views Left  Result Date: 10/01/2022 CLINICAL DATA:  Pain.  Worsening over the last month EXAM: LEFT KNEE - COMPLETE 4 VIEW COMPARISON:  Two-view x-ray 10/06/2017 FINDINGS: Osteopenia. No fracture or dislocation. Total knee arthroplasty seen with cemented tibial component Press-Fit femoral component. Patellar button. Expected alignment. No hardware failure. No joint effusion. IMPRESSION: Total knee arthroplasty.  Osteopenia.  No acute osseous abnormality Electronically Signed   By: Jill Side M.D.   On: 10/01/2022 18:13   DG Hip Unilat W or Wo Pelvis 2-3 Views Left  Result Date: 09/29/2022 CLINICAL DATA:  Hip pain for quite a while. EXAM: DG HIP (WITH OR WITHOUT PELVIS) 2-3V LEFT COMPARISON:  None Available. FINDINGS: Degenerative changes in the left hip with joint space narrowing and osteophyte formation. No evidence of acute fracture or dislocation. No focal bone lesion or bone destruction. Vascular calcifications. IMPRESSION: Moderate degenerative changes in the left hip. No acute bony abnormalities. Electronically Signed   By: Lucienne Capers M.D.   On: 09/29/2022 21:32   DG Pelvis 1-2 Views  Result Date: 09/29/2022 CLINICAL DATA:  Low back and LEFT hip pain, no trauma EXAM: PELVIS - 1-2 VIEW COMPARISON:  None Available. FINDINGS: Marked osseous demineralization. Hip and SI joint spaces preserved. No acute fracture, dislocation, or bone destruction. Scattered mild atherosclerotic calcifications. IMPRESSION: Marked osseous demineralization. No acute abnormalities. Electronically Signed   By: Lavonia Dana M.D.   On: 09/29/2022 17:01   DG Lumbar Spine Complete  Result Date: 09/29/2022 CLINICAL DATA:  Back and LEFT hip pain EXAM: LUMBAR SPINE - COMPLETE 4+ VIEW COMPARISON:  None Available. FINDINGS: Five non-rib-bearing lumbar vertebra. Marked osseous demineralization. Vertebral body heights maintained. Disc space narrowing L3-L4  and L4-L5 with grade 1 anterolisthesis at both levels. No fracture, additional subluxation, bone destruction or spondylolysis. Facet degenerative changes L4-L5 and L5-S1. SI joints preserved. IMPRESSION: Degenerative disc and facet disease changes lumbar spine with minimal grade 1 anterolisthesis at L3-L4 and L4-L5. Marked osseous demineralization. No acute abnormalities. Electronically Signed   By: Lavonia Dana M.D.   On: 09/29/2022 17:01     Subjective: Patient seen examined at bedside, resting comfortably in bed.  Tolerating advance diet.  Ready for discharge home.  Spouse present at bedside.  No other questions or concerns at this time.  Denies headache, no dizziness, no chest pain, no palpitations, no shortness of breath, no current abdominal pain, no fever/chills/night sweats, no nausea/vomiting/diarrhea, no focal weakness, fatigue, no paresthesias.  No acute events overnight per nursing  staff.  Discharge Exam: Vitals:   10/06/22 0416 10/06/22 0741  BP: (!) 141/103 103/69  Pulse: (!) 55 (!) 43  Resp: 16   Temp: 98.9 F (37.2 C) 98.1 F (36.7 C)  SpO2: 100% 100%   Vitals:   10/05/22 2105 10/05/22 2322 10/06/22 0416 10/06/22 0741  BP: (!) 152/94  (!) 141/103 103/69  Pulse: (!) 45 (!) 56 (!) 55 (!) 43  Resp:   16   Temp: 98.9 F (37.2 C)  98.9 F (37.2 C) 98.1 F (36.7 C)  TempSrc: Oral  Oral Oral  SpO2: 100%  100% 100%    Physical Exam: GEN: NAD, alert and oriented x 3, wd/wn HEENT: NCAT, PERRL, EOMI, sclera clear, MMM PULM: CTAB w/o wheezes/crackles, normal respiratory effort, on room air CV: RRR w/o M/G/R GI: abd soft, NTND, NABS, no R/G/M MSK: no peripheral edema, muscle strength globally intact 5/5 bilateral upper/lower extremities NEURO: CN II-XII intact, no focal deficits, sensation to light touch intact PSYCH: normal mood/affect Integumentary: dry/intact, no rashes or wounds    The results of significant diagnostics from this hospitalization (including imaging,  microbiology, ancillary and laboratory) are listed below for reference.     Microbiology: Recent Results (from the past 240 hour(s))  Resp panel by RT-PCR (RSV, Flu A&B, Covid) Anterior Nasal Swab     Status: Abnormal   Collection Time: 10/03/22  6:52 PM   Specimen: Anterior Nasal Swab  Result Value Ref Range Status   SARS Coronavirus 2 by RT PCR NEGATIVE NEGATIVE Final    Comment: (NOTE) SARS-CoV-2 target nucleic acids are NOT DETECTED.  The SARS-CoV-2 RNA is generally detectable in upper respiratory specimens during the acute phase of infection. The lowest concentration of SARS-CoV-2 viral copies this assay can detect is 138 copies/mL. A negative result does not preclude SARS-Cov-2 infection and should not be used as the sole basis for treatment or other patient management decisions. A negative result may occur with  improper specimen collection/handling, submission of specimen other than nasopharyngeal swab, presence of viral mutation(s) within the areas targeted by this assay, and inadequate number of viral copies(<138 copies/mL). A negative result must be combined with clinical observations, patient history, and epidemiological information. The expected result is Negative.  Fact Sheet for Patients:  EntrepreneurPulse.com.au  Fact Sheet for Healthcare Providers:  IncredibleEmployment.be  This test is no t yet approved or cleared by the [place] FDA and  has been authorized for detection and/or diagnosis of SARS-CoV-2 by FDA under an Emergency Use Authorization (EUA). This EUA will remain  in effect (meaning this test can be used) for the duration of the COVID-19 declaration under Section 564(b)(1) of the Act, 21 U.S.C.section 360bbb-3(b)(1), unless the authorization is terminated  or revoked sooner.       Influenza A by PCR NEGATIVE NEGATIVE Final   Influenza B by PCR POSITIVE (A) NEGATIVE Final    Comment: (NOTE) The Xpert  Xpress SARS-CoV-2/FLU/RSV plus assay is intended as an aid in the diagnosis of influenza from Nasopharyngeal swab specimens and should not be used as a sole basis for treatment. Nasal washings and aspirates are unacceptable for Xpert Xpress SARS-CoV-2/FLU/RSV testing.  Fact Sheet for Patients: EntrepreneurPulse.com.au  Fact Sheet for Healthcare Providers: IncredibleEmployment.be  This test is not yet approved or cleared by the [place] FDA and has been authorized for detection and/or diagnosis of SARS-CoV-2 by FDA under an Emergency Use Authorization (EUA). This EUA will remain in effect (meaning this test can be used) for the  duration of the COVID-19 declaration under Section 564(b)(1) of the Act, 21 U.S.C. section 360bbb-3(b)(1), unless the authorization is terminated or revoked.     Resp Syncytial Virus by PCR NEGATIVE NEGATIVE Final    Comment: (NOTE) Fact Sheet for Patients: EntrepreneurPulse.com.au  Fact Sheet for Healthcare Providers: IncredibleEmployment.be  This test is not yet approved or cleared by the [place] FDA and has been authorized for detection and/or diagnosis of SARS-CoV-2 by FDA under an Emergency Use Authorization (EUA). This EUA will remain in effect (meaning this test can be used) for the duration of the COVID-19 declaration under Section 564(b)(1) of the Act, 21 U.S.C. section 360bbb-3(b)(1), unless the authorization is terminated or revoked.  Performed at [facility], [place] [address] [place], [place] [address]      Labs: BNP (last 3 results) No results for input(s): "BNP" in the last 8760 hours. Basic Metabolic Panel: Recent Labs  Lab 10/01/22 1718 10/03/22 1714 10/04/22 0516 10/05/22 0209 10/06/22 0558  NA 139 136 137 137 137  K 3.8 3.6 3.4* 3.3* 3.8  CL 107 105 108 107 107  CO2 '23 25 22 24 23  '$ GLUCOSE 90 97 91 88 87  BUN '19 17 12 '$ 7* 7*   CREATININE 1.10* 1.33* 1.17* 1.06* 1.09*  CALCIUM 9.9 9.7 8.9 8.8* 8.9  MG  --   --   --  1.6* 2.0   Liver Function Tests: Recent Labs  Lab 09/29/22 1615 10/01/22 1718 10/03/22 1714  AST 21 24 34  ALT '18 17 25  '$ ALKPHOS 44 45 42  BILITOT 0.4 0.6 <0.1*  PROT 6.2* 6.2* 6.1*  ALBUMIN 3.9 3.9 3.9   Recent Labs  Lab 09/29/22 1615 10/01/22 1718  LIPASE 54* 51   No results for input(s): "AMMONIA" in the last 168 hours. CBC: Recent Labs  Lab 09/29/22 1615 10/01/22 1718 10/03/22 1714 10/04/22 0516 10/05/22 0209  WBC 7.3 6.5 6.0 4.7 4.5  NEUTROABS 4.5 3.9 4.6  --   --   HGB 12.5 12.3 12.4 10.6* 10.4*  HCT 36.8 36.4 36.6 31.8* 31.5*  MCV 95.3 95.3 96.1 95.8 97.2  PLT 206 194 165 131* 121*   Cardiac Enzymes: No results for input(s): "CKTOTAL", "CKMB", "CKMBINDEX", "TROPONINI" in the last 168 hours. BNP: Invalid input(s): "POCBNP" CBG: Recent Labs  Lab 10/04/22 0822  GLUCAP 97   D-Dimer No results for input(s): "DDIMER" in the last 72 hours. Hgb A1c No results for input(s): "HGBA1C" in the last 72 hours. Lipid Profile No results for input(s): "CHOL", "HDL", "LDLCALC", "TRIG", "CHOLHDL", "LDLDIRECT" in the last 72 hours. Thyroid function studies No results for input(s): "TSH", "T4TOTAL", "T3FREE", "THYROIDAB" in the last 72 hours.  Invalid input(s): "FREET3" Anemia work up No results for input(s): "VITAMINB12", "FOLATE", "FERRITIN", "TIBC", "IRON", "RETICCTPCT" in the last 72 hours. Urinalysis    Component Value Date/Time   COLORURINE YELLOW 10/03/2022 1955   APPEARANCEUR CLEAR 10/03/2022 1955   LABSPEC 1.019 10/03/2022 1955   PHURINE 5.0 10/03/2022 1955   GLUCOSEU NEGATIVE 10/03/2022 1955   HGBUR NEGATIVE 10/03/2022 1955   BILIRUBINUR NEGATIVE 10/03/2022 1955   BILIRUBINUR neg 12/09/2015 [place] 10/03/2022 1955   PROTEINUR NEGATIVE 10/03/2022 1955   UROBILINOGEN 0.2 12/09/2015 1701   UROBILINOGEN 0.2 04/13/2014 1422   NITRITE NEGATIVE  10/03/2022 1955   LEUKOCYTESUR TRACE (A) 10/03/2022 1955   Sepsis Labs Recent Labs  Lab 10/01/22 1718 10/03/22 1714 10/04/22 0516 10/05/22 0209  WBC 6.5 6.0 4.7 4.5  Microbiology Recent Results (from the past 240 hour(s))  Resp panel by RT-PCR (RSV, Flu A&B, Covid) Anterior Nasal Swab     Status: Abnormal   Collection Time: 10/03/22  6:52 PM   Specimen: Anterior Nasal Swab  Result Value Ref Range Status   SARS Coronavirus 2 by RT PCR NEGATIVE NEGATIVE Final    Comment: (NOTE) SARS-CoV-2 target nucleic acids are NOT DETECTED.  The SARS-CoV-2 RNA is generally detectable in upper respiratory specimens during the acute phase of infection. The lowest concentration of SARS-CoV-2 viral copies this assay can detect is 138 copies/mL. A negative result does not preclude SARS-Cov-2 infection and should not be used as the sole basis for treatment or other patient management decisions. A negative result may occur with  improper specimen collection/handling, submission of specimen other than nasopharyngeal swab, presence of viral mutation(s) within the areas targeted by this assay, and inadequate number of viral copies(<138 copies/mL). A negative result must be combined with clinical observations, patient history, and epidemiological information. The expected result is Negative.  Fact Sheet for Patients:  EntrepreneurPulse.com.au  Fact Sheet for Healthcare Providers:  IncredibleEmployment.be  This test is no t yet approved or cleared by the [place] FDA and  has been authorized for detection and/or diagnosis of SARS-CoV-2 by FDA under an Emergency Use Authorization (EUA). This EUA will remain  in effect (meaning this test can be used) for the duration of the COVID-19 declaration under Section 564(b)(1) of the Act, 21 U.S.C.section 360bbb-3(b)(1), unless the authorization is terminated  or revoked sooner.       Influenza A by PCR NEGATIVE  NEGATIVE Final   Influenza B by PCR POSITIVE (A) NEGATIVE Final    Comment: (NOTE) The Xpert Xpress SARS-CoV-2/FLU/RSV plus assay is intended as an aid in the diagnosis of influenza from Nasopharyngeal swab specimens and should not be used as a sole basis for treatment. Nasal washings and aspirates are unacceptable for Xpert Xpress SARS-CoV-2/FLU/RSV testing.  Fact Sheet for Patients: EntrepreneurPulse.com.au  Fact Sheet for Healthcare Providers: IncredibleEmployment.be  This test is not yet approved or cleared by the [place] FDA and has been authorized for detection and/or diagnosis of SARS-CoV-2 by FDA under an Emergency Use Authorization (EUA). This EUA will remain in effect (meaning this test can be used) for the duration of the COVID-19 declaration under Section 564(b)(1) of the Act, 21 U.S.C. section 360bbb-3(b)(1), unless the authorization is terminated or revoked.     Resp Syncytial Virus by PCR NEGATIVE NEGATIVE Final    Comment: (NOTE) Fact Sheet for Patients: EntrepreneurPulse.com.au  Fact Sheet for Healthcare Providers: IncredibleEmployment.be  This test is not yet approved or cleared by the [place] FDA and has been authorized for detection and/or diagnosis of SARS-CoV-2 by FDA under an Emergency Use Authorization (EUA). This EUA will remain in effect (meaning this test can be used) for the duration of the COVID-19 declaration under Section 564(b)(1) of the Act, 21 U.S.C. section 360bbb-3(b)(1), unless the authorization is terminated or revoked.  Performed at [facility], [place] [address], [place], [place] [address]      Time coordinating discharge: Over 30 minutes  SIGNED:   Donnamarie Poag [place] DO  [facility] 10/06/2022, 1:48 PM

## 2022-10-06 NOTE — Progress Notes (Signed)
Pharmacy Antibiotic Note  Rachel Black is a 72 y.o. female admitted on 10/03/2022 with  intra-abdominal infection .  Pharmacy has been consulted for Zosyn dosing.  Noted PCN allergy (itch); Zosyn had been administered in ED several hours ago, d/w RN who reports no adverse effects during or after infusion; she is aware to monitor pt with next dose and pass along to next RN for monitoring; admitting MD aware of plan.  No reaction noted, amending allergies. Day 3 for diverticulitis, no positive cultures  Plan: Continue Zosyn 3.375g IV q8h (4 hour infusion).    Temp (24hrs), Avg:98.5 F (36.9 C), Min:98.1 F (36.7 C), Max:98.9 F (37.2 C)  Recent Labs  Lab 09/29/22 1615 10/01/22 1718 10/03/22 1714 10/04/22 0516 10/05/22 0209 10/06/22 0558  WBC 7.3 6.5 6.0 4.7 4.5  --   CREATININE 1.14* 1.10* 1.33* 1.17* 1.06* 1.09*     Estimated Creatinine Clearance: 52 mL/min (A) (by C-G formula based on SCr of 1.09 mg/dL (H)).    Allergies  Allergen Reactions   Other Other (See Comments)    Some BANDAIDS cause skin irritation    Penicillins Itching    Has patient had a PCN reaction causing immediate rash, facial/tongue/throat swelling, SOB or lightheadedness with hypotension: No Has patient had a PCN reaction causing severe rash involving mucus membranes or skin necrosis: No Has patient had a PCN reaction that required hospitalization No Has patient had a PCN reaction occurring within the last 10 years: No If all of the above answers are "NO", then may proceed with Cephalosporin use.     Thank you for allowing pharmacy to be a part of this patient's care.  Alanda Slim, PharmD, [facility] Clinical Pharmacist Please see AMION for all Pharmacists' Contact Phone Numbers 10/06/2022, 9:50 AM

## 2022-10-06 NOTE — Progress Notes (Signed)
Patient tolerated full liquid breakfast, no complaints offered.  Diet advanced to soft diet per order.

## 2022-10-06 NOTE — Progress Notes (Signed)
PROGRESS NOTE    Rachel Black  ZOX:[id number] DOB: 05/20/1951 DOA: 10/03/2022 PCP: Lorrene Reid, PA-C    Brief Narrative:   Rachel Black is a 72 y.o. female with past medical history significant for CAD, HTN, HLD, PFO, history of CVA, DM2, CKD stage IIIa, GERD, gout, osteoarthritis s/p bilateral knee replacement who presents to [facility] ED on 1/20 with continued abdominal, hip and lower extremity pain.  Patient reports left lower quadrant pain, ongoing for several days.  Patient also endorses chronic constipation in which she takes medications and eats prunes for this.  Also reports coughing over the last few days.  Denies shortness of breath, no fever, no chest pain, no palpitation.  Recently seen in the ED on 1/16 for abdominal pain and left hip pain.  UA was concerning for infection.  X-rays of lumbar spine/pelvis/left hip negative for acute finding.  She was discharged on Omnicef and Flagyl for UTI and possible colitis.  No urine culture sent.  She returned to the ED on 1/18 complaining of ongoing abdominal pain and also bilateral knee pain. CT abdomen pelvis with contrast showing possible mild or early diverticulitis of the sigmoid colon and no complicating features such as perforation or abscess. Bilateral knee x-rays negative for acute finding.   In the ED, temperature 99.3 F, HR 88, RR 18, BP 110/68, SpO2 100% on room air.  WBC 6.0, hemoglobin 12.4, platelets 165.  Sodium 136, potassium 3.6, chloride 105, CO2 25, glucose 97, BUN 17, creatinine 1.33.  AST 34, ALT 25, total bilirubin less than 0.1.  COVID/RSV PCR negative.  Influenza B PCR positive.  Urinalysis unrevealing.  Patient was given Zosyn, morphine, Tamiflu and 1 L NS bolus.  TRH consulted for admission for further evaluation management of diverticulitis and influenza B viral infection.  Assessment & Plan:   Acute diverticulitis Patient presenting to ED with persistent left lower quadrant pain.  Recent CT Abd/pelvis notable for  mild/early diverticulitis of the sigmoid colon with no complicating features such as perforation or abscess.  Lipase 51.  Patient is afebrile without leukocytosis. --Continue Zosyn 3.375 g IV every 8 hours -- IVF with NS at 75 mL/h -- Diet advanced to soft diet this afternoon -- Supportive care, pain control, antiemetics  Influenza B viral infection Chest x-ray with low lung volumes with mild atelectasis right lung base.  No consolidation. -- Tamiflu 75 mg p.o. twice daily -- Incentive spirometry -- Droplet precautions -- Supportive care  Left hip pain Bilateral knee pain Hx osteoarthritis s/p bilateral TKA X-rays negative for fracture or dislocation.  No falls reported. -- Seen by OT with no recommendations -- PT evaluation: Outpatient PT, patient declined -- outpatient follow-up with orthopedics  CAD -- Continue aspirin and statin  Essential hypertension On isosorbide and irbesartan outpatient.  Blood pressures borderline low on admission. -- Continue to hold home antihypertensives -- Continue monitor BP closely  Dyslipidemia --Crestor 40 mg p.o. daily --Fenofibrate 160 mg p.o. daily  History of CVA -- Continue aspirin and statin  Type 2 diabetes mellitus Diet controlled at baseline.  Hemoglobin A1c 5.6, well-controlled.  CKD stage IIIa --Cr 1.33>1.17>1.06>1.09; stable and at baseline -- Avoid nephrotoxins, renal dose all medications -- BMP in a.m.  Hypokalemia Hypomagnesemia Potassium 3.3, magnesium 1.6 this morning, will replete. -- Repeat electrolytes in the a.m.  Gout  Stable.  Patient is taking allopurinol 300 mg twice daily but it seems to be a high dose considering her chronic kidney disease.  I spoke to  pharmacist and he confirmed that this is the patient's current outpatient dose, last prescribed in December for 74-monthsupply. -Will need close follow-up with PCP for consideration of dose adjustment.   DVT prophylaxis: enoxaparin (LOVENOX) injection 40  mg Start: 10/04/22 0800    Code Status: Full Code Family Communication: Updated spouse present at bedside this morning  Disposition Plan:  Level of care: Med-Surg Status is: Inpatient Remains inpatient appropriate because: IV antibiotics, awaiting advance diet, if tolerates plan discharge later today versus tomorrow  Consultants:  None  Procedures:  None  Antimicrobials:  Zosyn 1/20>>   Subjective: Patient seen examined bedside, resting comfortably.  Lying in bed.  Spouse present.  Nausea improved.  Denies vomiting.  Tolerated full liquid diet and advance to soft diet today.  Discussed with patient and spouse if tolerates soft diet may discharge later today versus tomorrow.  Remains on IV Zosyn.  Seen by PT with recommendation of outpatient PT but patient declined.  No other questions or concerns at this time.  Denies headache, no dizziness, no chest pain, palpitations, no shortness of breath, no fever/chills/night sweats, no nausea/vomiting/diarrhea, no focal weakness, no cough/congestion, no fatigue, no paresthesias.  No acute events overnight per nursing staff.  Objective: Vitals:   10/05/22 2105 10/05/22 2322 10/06/22 0416 10/06/22 0741  BP: (!) 152/94  (!) 141/103 103/69  Pulse: (!) 45 (!) 56 (!) 55 (!) 43  Resp:   16   Temp: 98.9 F (37.2 C)  98.9 F (37.2 C) 98.1 F (36.7 C)  TempSrc: Oral  Oral Oral  SpO2: 100%  100% 100%    Intake/Output Summary (Last 24 hours) at 10/06/2022 1217 Last data filed at 10/06/2022 1000 Gross per 24 hour  Intake 825 ml  Output --  Net 825 ml   There were no vitals filed for this visit.  Examination:  Physical Exam: GEN: NAD, alert and oriented x 3, wd/wn HEENT: NCAT, PERRL, EOMI, sclera clear, MMM PULM: CTAB w/o wheezes/crackles, normal respiratory effort, on room air CV: RRR w/o M/G/R GI: abd soft, mild tenderness left lower quadrant, nondistended, NABS, no R/G/M MSK: no peripheral edema, muscle strength globally intact 5/5  bilateral upper/lower extremities NEURO: CN II-XII intact, no focal deficits, sensation to light touch intact PSYCH: normal mood/affect Integumentary: dry/intact, no rashes or wounds    Data Reviewed: I have personally reviewed following labs and imaging studies  CBC: Recent Labs  Lab 09/29/22 1615 10/01/22 1718 10/03/22 1714 10/04/22 0516 10/05/22 0209  WBC 7.3 6.5 6.0 4.7 4.5  NEUTROABS 4.5 3.9 4.6  --   --   HGB 12.5 12.3 12.4 10.6* 10.4*  HCT 36.8 36.4 36.6 31.8* 31.5*  MCV 95.3 95.3 96.1 95.8 97.2  PLT 206 194 165 131* 1194   Basic Metabolic Panel: Recent Labs  Lab 10/01/22 1718 10/03/22 1714 10/04/22 0516 10/05/22 0209 10/06/22 0558  NA 139 136 137 137 137  K 3.8 3.6 3.4* 3.3* 3.8  CL 107 105 108 107 107  CO2 '23 25 22 24 23  '$ GLUCOSE 90 97 91 88 87  BUN '19 17 12 '$ 7* 7*  CREATININE 1.10* 1.33* 1.17* 1.06* 1.09*  CALCIUM 9.9 9.7 8.9 8.8* 8.9  MG  --   --   --  1.6* 2.0   GFR: Estimated Creatinine Clearance: 52 mL/min (A) (by C-G formula based on SCr of 1.09 mg/dL (H)). Liver Function Tests: Recent Labs  Lab 09/29/22 1615 10/01/22 1718 10/03/22 1714  AST 21 24 34  ALT 18  17 25  ALKPHOS 44 45 42  BILITOT 0.4 0.6 <0.1*  PROT 6.2* 6.2* 6.1*  ALBUMIN 3.9 3.9 3.9   Recent Labs  Lab 09/29/22 1615 10/01/22 1718  LIPASE 54* 51   No results for input(s): "AMMONIA" in the last 168 hours. Coagulation Profile: No results for input(s): "INR", "PROTIME" in the last 168 hours. Cardiac Enzymes: No results for input(s): "CKTOTAL", "CKMB", "CKMBINDEX", "TROPONINI" in the last 168 hours. BNP (last 3 results) No results for input(s): "PROBNP" in the last 8760 hours. HbA1C: No results for input(s): "HGBA1C" in the last 72 hours. CBG: Recent Labs  Lab 10/04/22 0822  GLUCAP 97   Lipid Profile: No results for input(s): "CHOL", "HDL", "LDLCALC", "TRIG", "CHOLHDL", "LDLDIRECT" in the last 72 hours. Thyroid Function Tests: No results for input(s): "TSH",  "T4TOTAL", "FREET4", "T3FREE", "THYROIDAB" in the last 72 hours. Anemia Panel: No results for input(s): "VITAMINB12", "FOLATE", "FERRITIN", "TIBC", "IRON", "RETICCTPCT" in the last 72 hours. Sepsis Labs: No results for input(s): "PROCALCITON", "LATICACIDVEN" in the last 168 hours.  Recent Results (from the past 240 hour(s))  Resp panel by RT-PCR (RSV, Flu A&B, Covid) Anterior Nasal Swab     Status: Abnormal   Collection Time: 10/03/22  6:52 PM   Specimen: Anterior Nasal Swab  Result Value Ref Range Status   SARS Coronavirus 2 by RT PCR NEGATIVE NEGATIVE Final    Comment: (NOTE) SARS-CoV-2 target nucleic acids are NOT DETECTED.  The SARS-CoV-2 RNA is generally detectable in upper respiratory specimens during the acute phase of infection. The lowest concentration of SARS-CoV-2 viral copies this assay can detect is 138 copies/mL. A negative result does not preclude SARS-Cov-2 infection and should not be used as the sole basis for treatment or other patient management decisions. A negative result may occur with  improper specimen collection/handling, submission of specimen other than nasopharyngeal swab, presence of viral mutation(s) within the areas targeted by this assay, and inadequate number of viral copies(<138 copies/mL). A negative result must be combined with clinical observations, patient history, and epidemiological information. The expected result is Negative.  Fact Sheet for Patients:  EntrepreneurPulse.com.au  Fact Sheet for Healthcare Providers:  IncredibleEmployment.be  This test is no t yet approved or cleared by the [place] FDA and  has been authorized for detection and/or diagnosis of SARS-CoV-2 by FDA under an Emergency Use Authorization (EUA). This EUA will remain  in effect (meaning this test can be used) for the duration of the COVID-19 declaration under Section 564(b)(1) of the Act, 21 U.S.C.section 360bbb-3(b)(1),  unless the authorization is terminated  or revoked sooner.       Influenza A by PCR NEGATIVE NEGATIVE Final   Influenza B by PCR POSITIVE (A) NEGATIVE Final    Comment: (NOTE) The Xpert Xpress SARS-CoV-2/FLU/RSV plus assay is intended as an aid in the diagnosis of influenza from Nasopharyngeal swab specimens and should not be used as a sole basis for treatment. Nasal washings and aspirates are unacceptable for Xpert Xpress SARS-CoV-2/FLU/RSV testing.  Fact Sheet for Patients: EntrepreneurPulse.com.au  Fact Sheet for Healthcare Providers: IncredibleEmployment.be  This test is not yet approved or cleared by the [place] FDA and has been authorized for detection and/or diagnosis of SARS-CoV-2 by FDA under an Emergency Use Authorization (EUA). This EUA will remain in effect (meaning this test can be used) for the duration of the COVID-19 declaration under Section 564(b)(1) of the Act, 21 U.S.C. section 360bbb-3(b)(1), unless the authorization is terminated or revoked.     Resp  Syncytial Virus by PCR NEGATIVE NEGATIVE Final    Comment: (NOTE) Fact Sheet for Patients: EntrepreneurPulse.com.au  Fact Sheet for Healthcare Providers: IncredibleEmployment.be  This test is not yet approved or cleared by the [place] FDA and has been authorized for detection and/or diagnosis of SARS-CoV-2 by FDA under an Emergency Use Authorization (EUA). This EUA will remain in effect (meaning this test can be used) for the duration of the COVID-19 declaration under Section 564(b)(1) of the Act, 21 U.S.C. section 360bbb-3(b)(1), unless the authorization is terminated or revoked.  Performed at [facility], [place] [address] [place],  [address]          Radiology Studies: No results found.      Scheduled Meds:  allopurinol  300 mg Oral BID   aspirin EC  81 mg Oral Daily   enoxaparin (LOVENOX)  injection  40 mg Subcutaneous Q24H   fenofibrate  160 mg Oral Daily   oseltamivir  30 mg Oral BID   rosuvastatin  40 mg Oral Daily   Continuous Infusions:  sodium chloride 75 mL/hr at 10/05/22 2004   piperacillin-tazobactam (ZOSYN)  IV 3.375 g (10/06/22 0851)     LOS: 2 days    Time spent: 52 minutes spent on chart review, discussion with nursing staff, consultants, updating family and interview/physical exam; more than 50% of that time was spent in counseling and/or coordination of care.    Byron Tipping J [place] DO [facility] Available via Epic secure chat 7am-7pm After these hours, please refer to coverage provider listed on [email or web address] 10/06/2022, 12:17 PM

## 2022-10-06 NOTE — TOC Initial Note (Signed)
Transition of Care (TOC) - Initial/Assessment Note   Spoke to patient and her husband [name] at bedside.   Discussed PT recommendation for OP PT. Patient voiced understanding, however at this time patient does not feel that she needs OP PT. Husband in agreement.   Explained if patient changes her mind after discharge PCP can arrange. Patient voiced understanding  Patient Details  Name: Rachel Black MRN: [id number] Date of Birth: 08/27/1951  Transition of Care [facility]) CM/SW Contact:    Marilu Favre, RN Phone Number: 10/06/2022, 11:18 AM  Clinical Narrative:                   Expected Discharge Plan: Home/Self Care Barriers to Discharge: Continued Medical Work up   Patient Goals and CMS Choice Patient states their goals for this hospitalization and ongoing recovery are:: to return to home   Choice offered to / list presented to : Patient, Spouse      Expected Discharge Plan and Services   Discharge Planning Services: CM Consult Post Acute Care Choice:  (OT PT) Living arrangements for the past 2 months: Single Family Home                 DME Arranged: N/A DME Agency: NA       HH Arranged: NA          Prior Living Arrangements/Services Living arrangements for the past 2 months: Single Family Home Lives with:: Spouse Patient language and need for interpreter reviewed:: Yes Do you feel safe going back to the place where you live?: Yes      Need for Family Participation in Patient Care: Yes (Comment) Care giver support system in place?: Yes (comment) Current home services: DME Criminal Activity/Legal Involvement Pertinent to Current Situation/Hospitalization: No - Comment as needed  Activities of Daily Living Home Assistive Devices/Equipment: Gilford Rile (specify type) ADL Screening (condition at time of admission) Patient's cognitive ability adequate to safely complete daily activities?: Yes Is the patient deaf or have difficulty hearing?: No Does the patient  have difficulty seeing, even when wearing glasses/contacts?: Yes Does the patient have difficulty concentrating, remembering, or making decisions?: Yes Patient able to express need for assistance with ADLs?: Yes Does the patient have difficulty dressing or bathing?: Yes Independently performs ADLs?: Yes (appropriate for developmental age) Does the patient have difficulty walking or climbing stairs?: Yes Weakness of Legs: Both Weakness of Arms/Hands: Both  Permission Sought/Granted   Permission granted to share information with : Yes, Verbal Permission Granted  Share Information with NAME: husband Ozie Dimaria           Emotional Assessment Appearance:: Appears stated age Attitude/Demeanor/Rapport: Engaged Affect (typically observed): Accepting Orientation: : Oriented to Self, Oriented to Place, Oriented to  Time, Oriented to Situation Alcohol / Substance Use: Not Applicable Psych Involvement: No (comment)  Admission diagnosis:  Diverticulitis [K57.92] Influenza B [J10.1] AKI (acute kidney injury) ([place]) [N17.9] Acute diverticulitis [K57.92] Patient Active Problem List   Diagnosis Date Noted   Left hip pain 10/04/2022   Bilateral knee pain 10/04/2022   Acute diverticulitis 10/03/2022   Hypertension associated with diabetes ([place]) 03/23/2022   Epigastric pain 03/16/2022   Constipation 03/16/2022   Influenza B 09/16/2021   Acute cystitis without hematuria 09/16/2021   Closed nondisplaced fracture of metatarsal bone of right foot, initial encounter 41/66/0630   Acute metabolic encephalopathy 16/09/930   Cryptogenic stroke ([place]) 01/13/2021   GERD without esophagitis 11/26/2020   Iron deficiency 11/26/2020   Generalized abdominal  pain 11/26/2020   Pneumonia due to COVID-19 virus    Mixed hyperlipidemia due to type 2 diabetes mellitus ([place]) 11/27/2019   Family history of breast cancer in mother 11/27/2019   Cataract of both eyes 11/27/2019   Coronary artery disease involving  native coronary artery of native heart without angina pectoris 02/18/2018   NSTEMI (non-ST elevated myocardial infarction) [facility])    Chest pain of uncertain etiology 58/52/7782   Need for shingles vaccine 09/21/2017   Need for pneumococcal vaccine 09/21/2017   History of rheumatic fever 06/30/2017   PFO (patent foramen ovale)    Cerebrovascular accident (CVA) due to embolism of right middle cerebral artery ([place]) 03/14/2017   Dilation of biliary tract    Elevated LFTs    RUQ abdominal pain    Acute gallstone pancreatitis 12/07/2016   Choledocholithiasis with obstruction 12/07/2016   AKI (acute kidney injury) ([place]) 12/07/2016   Osteoarthritis of right knee 08/23/2015   Status post total left knee replacement 08/23/2015   Arthritis of knee, right 04/20/2014   Status post total right knee replacement 04/20/2014   Flushing reaction 03/10/2012   Diabetes mellitus ([place]) 02/17/2007   Mixed hyperlipidemia 02/17/2007   GOUT 02/17/2007   Severe obesity (BMI >= 40) ([place]) 02/17/2007   Essential hypertension 02/17/2007   RHEUMATIC FEVER, HX OF 02/17/2007   PCP:  Lorrene Reid, PA-C Pharmacy:   [id number] [address] [address]Phone: [phone]:: [phone]    Social Determinants of Health (SDOH) Social History: SDOH Screenings   Depression (PHQ2-9): Medium Risk (09/28/2022)  Tobacco Use: Low Risk  (10/03/2022)   SDOH Interventions:     Readmission Risk Interventions     No data to display

## 2022-10-06 NOTE — Progress Notes (Signed)
   10/06/22 1524  AVS Discharge Documentation  AVS Discharge Instructions Including Medications Provided to patient/caregiver  Name of Person Receiving AVS Discharge Instructions Including Medications reviewed with Rachel Black and Rachel Black  Name of Clinician That Reviewed AVS Discharge Instructions Including Medications Whitney Muse RN   Written AVS reviewed with pt and husband Rachel Black; both state understanding. Noted vitamin D on list of meds to stop. Pt says her PCP recently started this med and asks why she should stop it. RN messaged Dr. [place] who confirmed pt may continue to take the Vitamin D. RN notified pt and husband. All other questions answered. Pt left unit via wheelchair with transport staff and husband; room checked and confirmed pt has all belongings; husband to drive pt home.

## 2022-10-07 ENCOUNTER — Telehealth: Payer: Self-pay

## 2022-10-07 NOTE — Telephone Encounter (Signed)
Transition Care Management Follow-up Telephone Call Date of discharge and from where: [facility] 10/06/2022 How have you been since you were released from the hospital? Not great Any questions or concerns? No  Items Reviewed: Did the pt receive and understand the discharge instructions provided? Yes  Medications obtained and verified? Yes  Other? No  Any new allergies since your discharge? No  Dietary orders reviewed? Yes Do you have support at home? Yes   Home Care and Equipment/Supplies: Were home health services ordered? no If so, what is the name of the agency? N/a  Has the agency set up a time to come to the patient's home? no Were any new equipment or medical supplies ordered?  No What is the name of the medical supply agency? N/a Were you able to get the supplies/equipment? no Do you have any questions related to the use of the equipment or supplies? No  Functional Questionnaire: (I = Independent and D = Dependent) ADLs: I  Bathing/Dressing- I  Meal Prep- I  Eating- I  Maintaining continence- I  Transferring/Ambulation- I  Managing Meds- I  Follow up appointments reviewed:  PCP Hospital f/u appt confirmed? No  No avail appt times. Sent message to staff Specialist Hospital f/u appt confirmed? No Patient will call to schedule Are transportation arrangements needed? No  If their condition worsens, is the pt aware to call PCP or go to the Emergency Dept.? Yes Was the patient provided with contact information for the PCP's office or ED? Yes Was to pt encouraged to call back with questions or concerns? Yes Juanda Crumble, LPN [place] Direct Dial [phone]

## 2022-10-08 ENCOUNTER — Other Ambulatory Visit: Payer: Self-pay

## 2022-10-08 ENCOUNTER — Emergency Department (HOSPITAL_COMMUNITY)
Admission: EM | Admit: 2022-10-08 | Discharge: 2022-10-09 | Disposition: A | Discharge status: Home / Self Care | Payer: BC Managed Care – PPO | Attending: Emergency Medicine | Admitting: Emergency Medicine

## 2022-10-08 ENCOUNTER — Encounter (HOSPITAL_COMMUNITY): Payer: Self-pay | Admitting: Emergency Medicine

## 2022-10-08 DIAGNOSIS — R1030 Lower abdominal pain, unspecified: Secondary | ICD-10-CM | POA: Diagnosis present

## 2022-10-08 DIAGNOSIS — Z7982 Long term (current) use of aspirin: Secondary | ICD-10-CM | POA: Insufficient documentation

## 2022-10-08 DIAGNOSIS — R110 Nausea: Secondary | ICD-10-CM | POA: Insufficient documentation

## 2022-10-08 DIAGNOSIS — Z79899 Other long term (current) drug therapy: Secondary | ICD-10-CM | POA: Insufficient documentation

## 2022-10-08 DIAGNOSIS — I10 Essential (primary) hypertension: Secondary | ICD-10-CM | POA: Insufficient documentation

## 2022-10-08 DIAGNOSIS — R109 Unspecified abdominal pain: Secondary | ICD-10-CM

## 2022-10-08 LAB — CBC WITH DIFFERENTIAL/PLATELET
Abs Immature Granulocytes: 0.02 10*3/uL (ref 0.00–0.07)
Basophils Absolute: 0 10*3/uL (ref 0.0–0.1)
Basophils Relative: 1 %
Eosinophils Absolute: 0 10*3/uL (ref 0.0–0.5)
Eosinophils Relative: 0 %
HCT: 35.9 % — ABNORMAL LOW (ref 36.0–46.0)
Hemoglobin: 12.3 g/dL (ref 12.0–15.0)
Immature Granulocytes: 0 %
Lymphocytes Relative: 42 %
Lymphs Abs: 1.9 10*3/uL (ref 0.7–4.0)
MCH: 32.6 pg (ref 26.0–34.0)
MCHC: 34.3 g/dL (ref 30.0–36.0)
MCV: 95.2 fL (ref 80.0–100.0)
Monocytes Absolute: 0.2 10*3/uL (ref 0.1–1.0)
Monocytes Relative: 5 %
Neutro Abs: 2.3 10*3/uL (ref 1.7–7.7)
Neutrophils Relative %: 52 %
Platelets: 155 10*3/uL (ref 150–400)
RBC: 3.77 MIL/uL — ABNORMAL LOW (ref 3.87–5.11)
RDW: 12.9 % (ref 11.5–15.5)
WBC: 4.5 10*3/uL (ref 4.0–10.5)
nRBC: 0 % (ref 0.0–0.2)

## 2022-10-08 LAB — COMPREHENSIVE METABOLIC PANEL
ALT: 20 U/L (ref 0–44)
AST: 24 U/L (ref 15–41)
Albumin: 3.6 g/dL (ref 3.5–5.0)
Alkaline Phosphatase: 38 U/L (ref 38–126)
Anion gap: 8 (ref 5–15)
BUN: 10 mg/dL (ref 8–23)
CO2: 26 mmol/L (ref 22–32)
Calcium: 10.2 mg/dL (ref 8.9–10.3)
Chloride: 105 mmol/L (ref 98–111)
Creatinine, Ser: 1.02 mg/dL — ABNORMAL HIGH (ref 0.44–1.00)
GFR, Estimated: 59 mL/min — ABNORMAL LOW (ref >60)
Glucose, Bld: 102 mg/dL — ABNORMAL HIGH (ref 70–99)
Potassium: 4.5 mmol/L (ref 3.5–5.1)
Sodium: 139 mmol/L (ref 135–145)
Total Bilirubin: 0.3 mg/dL (ref 0.3–1.2)
Total Protein: 6.1 g/dL — ABNORMAL LOW (ref 6.5–8.1)

## 2022-10-08 LAB — LIPASE, BLOOD: Lipase: 74 U/L — ABNORMAL HIGH (ref 11–51)

## 2022-10-08 NOTE — ED Provider Triage Note (Signed)
Emergency Medicine Provider Triage Evaluation Note  Rachel Black , a 72 y.o. female  was evaluated in triage.  Pt complains of lower abdominal pain that got worse today.  Patient recently diagnosed with diverticulitis.  She was discharged from the hospital feeling okay however the pain returned today.  Endorses nausea without vomiting.  No fever, chest pain, shortness of breath, urinary symptoms.  Denies blood in the stool or urine.  Review of Systems  Positive: As above Negative: As above  Physical Exam  BP (!) 128/92 (BP Location: Right Arm)   Pulse 65   Temp 99.9 F (37.7 C) (Oral)   Resp 16   SpO2 100%  Gen:   Awake, no distress   Resp:  Normal effort  MSK:   Moves extremities without difficulty  Other:  TTP to lower abdomen.  Medical Decision Making  Medically screening exam initiated at 4:58 PM.  Appropriate orders placed.  [name] was informed that the remainder of the evaluation will be completed by another provider, this initial triage assessment does not replace that evaluation, and the importance of remaining in the ED until their evaluation is complete.    Rex Kras, PA 10/08/22 1700

## 2022-10-08 NOTE — ED Triage Notes (Signed)
Pt reports abdominal pain and nausea. Pt reports she was recently D/C from the hospital and reports she had diverticulitis.

## 2022-10-09 ENCOUNTER — Emergency Department (HOSPITAL_COMMUNITY): Payer: BC Managed Care – PPO

## 2022-10-09 LAB — URINALYSIS, ROUTINE W REFLEX MICROSCOPIC
Bilirubin Urine: NEGATIVE
Glucose, UA: NEGATIVE mg/dL
Hgb urine dipstick: NEGATIVE
Ketones, ur: NEGATIVE mg/dL
Leukocytes,Ua: NEGATIVE
Nitrite: NEGATIVE
Protein, ur: NEGATIVE mg/dL
Specific Gravity, Urine: >1.046 — ABNORMAL HIGH (ref 1.005–1.030)
pH: 5 (ref 5.0–8.0)

## 2022-10-09 MED ORDER — ONDANSETRON 4 MG PO TBDP
4.0000 mg | ORAL_TABLET | Freq: Once | ORAL | Status: AC
  Administered 2022-10-09: 4 mg via ORAL
  Filled 2022-10-09: qty 1

## 2022-10-09 MED ORDER — ACETAMINOPHEN 325 MG PO TABS
650.0000 mg | ORAL_TABLET | Freq: Once | ORAL | Status: AC
  Administered 2022-10-09: 650 mg via ORAL
  Filled 2022-10-09: qty 2

## 2022-10-09 MED ORDER — IOHEXOL 350 MG/ML SOLN
75.0000 mL | Freq: Once | INTRAVENOUS | Status: AC | PRN
  Administered 2022-10-09: 75 mL via INTRAVENOUS

## 2022-10-09 NOTE — Discharge Instructions (Addendum)
Your workup in the ED today was reassuring.  Your diverticulitis appears to be resolving.  Complete your course of antibiotics.  We recommend Tylenol for management of ongoing pain.  Follow-up with your primary care doctor for recheck given your recent hospitalization and multiple ED visits for this complaint.

## 2022-10-09 NOTE — ED Provider Notes (Signed)
[facility] Provider Note   CSN: [id number] Arrival date & time: 10/08/22  1631     History  Chief Complaint  Patient presents with   Abdominal Pain    Rachel Black is a 72 y.o. female.  72 year old female presents to the emergency department for evaluation of lower abdominal pain.  She states that pain is intermittent and waxing and waning in severity.  She was recently admitted on 10/03/2022 for diverticulitis.  Discharge was 3 days ago.  She has been compliant with her prescribed Augmentin.  Was also diagnosed with influenza during this admission, but has neglected to complete course of Tamiflu.  She has not had any fevers since time of discharge.  Complains of some ongoing nausea without vomiting or diarrhea.  No melena, hematochezia, chest pain, shortness of breath, urinary symptoms.  Abdominal surgical history notable for C-section, partial hysterectomy, ventral hernia repair.   Abdominal Pain      Home Medications Prior to Admission medications   Medication Sig Start Date End Date Taking? Authorizing Provider  ACCU-CHEK AVIVA PLUS test strip USE AS INSTRUCTED ONCE A DAY. DX CODE E11.9 07/20/22   Lorrene Reid, PA-C  Accu-Chek Softclix Lancets lancets Use as instructed once a day.  DX Code: E11.9 03/24/22   Lorrene Reid, PA-C  allopurinol (ZYLOPRIM) 300 MG tablet TAKE 1 TABLET BY MOUTH TWICE A DAY 08/27/22   Ronnell Freshwater, NP  amoxicillin-clavulanate (AUGMENTIN) 875-125 MG tablet Take 1 tablet by mouth 2 (two) times daily for 13 doses. 10/06/22 10/13/22  [place] Donnamarie Poag, DO  aspirin 81 MG EC tablet Take 1 tablet (81 mg total) by mouth daily. 10/18/17   Reino Bellis B, NP  CALCIUM PO Take 2 tablets by mouth daily.    [provider]  Emollient (UDDERLY SMOOTH) CREA Apply 1 application topically See admin instructions. Apply topically to skin folds as needed for rash/irritation    [provider]   famotidine (PEPCID) 10 MG tablet Take 10 mg by mouth daily as needed for heartburn or indigestion.    [provider]  fenofibrate 160 MG tablet Take 1 tablet (160 mg total) by mouth daily. 09/10/22   Ronnell Freshwater, NP  ferrous sulfate 325 (65 FE) MG tablet TAKE 1 TABLET BY MOUTH TWICE A DAY Patient taking differently: Take 325 mg by mouth daily. 08/03/22   Zehr, Laban Emperor, PA-C  isosorbide mononitrate (IMDUR) 60 MG 24 hr tablet Take 1 tablet (60 mg total) by mouth daily. Patient taking differently: Take 30 mg by mouth daily. 03/09/22   Minus Breeding, MD  linaclotide Rolan Lipa) 72 MCG capsule Take 1 capsule (72 mcg total) by mouth daily before breakfast. 09/28/22   Boscia, Greer Ee, NP  olmesartan (BENICAR) 5 MG tablet TAKE 1 TABLET (5 MG TOTAL) BY MOUTH DAILY. 07/12/22   Lorrene Reid, PA-C  rosuvastatin (CRESTOR) 40 MG tablet Take 1 tablet (40 mg total) by mouth daily. 03/24/22   Minus Breeding, MD  triamcinolone (KENALOG) 0.025 % cream Apply 1 Application topically 2 (two) times daily. 09/28/22   Ronnell Freshwater, NP      Allergies    Other and Penicillins    Review of Systems   Review of Systems  Gastrointestinal:  Positive for abdominal pain.  Ten systems reviewed and are negative for acute change, except as noted in the HPI.    Physical Exam Updated Vital Signs BP (!) 145/86   Pulse 62   Temp  97.9 F (36.6 C) (Oral)   Resp 18   SpO2 100%   Physical Exam Vitals and nursing note reviewed.  Constitutional:      General: She is not in acute distress.    Appearance: She is well-developed. She is not diaphoretic.     Comments: Awake and alert, nontoxic-appearing  HENT:     Head: Normocephalic and atraumatic.  Eyes:     General: No scleral icterus.    Conjunctiva/sclera: Conjunctivae normal.  Cardiovascular:     Rate and Rhythm: Normal rate and regular rhythm.     Pulses: Normal pulses.  Pulmonary:     Effort: Pulmonary effort is normal. No respiratory  distress.     Comments: Respirations even and unlabored Abdominal:     General: There is no distension.     Palpations: Abdomen is soft.     Tenderness: There is no guarding.     Comments: Abdomen soft, obese, nondistended.  No focal tenderness noted on exam.  No peritoneal signs.  Musculoskeletal:        General: Normal range of motion.     Cervical back: Normal range of motion.  Skin:    General: Skin is warm and dry.     Coloration: Skin is not pale.     Findings: No erythema or rash.  Neurological:     Mental Status: She is alert and oriented to person, place, and time.  Psychiatric:        Behavior: Behavior normal.     ED Results / Procedures / Treatments   Labs (all labs ordered are listed, but only abnormal results are displayed) Labs Reviewed  CBC WITH DIFFERENTIAL/PLATELET - Abnormal; Notable for the following components:      Result Value   RBC 3.77 (*)    HCT 35.9 (*)    All other components within normal limits  COMPREHENSIVE METABOLIC PANEL - Abnormal; Notable for the following components:   Glucose, Bld 102 (*)    Creatinine, Ser 1.02 (*)    Total Protein 6.1 (*)    GFR, Estimated 59 (*)    All other components within normal limits  LIPASE, BLOOD - Abnormal; Notable for the following components:   Lipase 74 (*)    All other components within normal limits  URINALYSIS, ROUTINE W REFLEX MICROSCOPIC - Abnormal; Notable for the following components:   Specific Gravity, Urine >1.046 (*)    All other components within normal limits    EKG None  Radiology CT ABDOMEN PELVIS W CONTRAST  Result Date: 10/09/2022 CLINICAL DATA:  Left lower quadrant abdominal pain. EXAM: CT ABDOMEN AND PELVIS WITH CONTRAST TECHNIQUE: Multidetector CT imaging of the abdomen and pelvis was performed using the standard protocol following bolus administration of intravenous contrast. RADIATION DOSE REDUCTION: This exam was performed according to the departmental dose-optimization  program which includes automated exposure control, adjustment of the mA and/or kV according to patient size and/or use of iterative reconstruction technique. CONTRAST:  89m OMNIPAQUE IOHEXOL 350 MG/ML SOLN COMPARISON:  CT abdomen pelvis dated 10/02/2022. FINDINGS: Evaluation of this exam is limited in the absence of intravenous contrast. Lower chest: The visualized lung bases are clear. No intra-abdominal free air or free fluid. Hepatobiliary: The liver is unremarkable. Mild biliary dilatation, likely post cholecystectomy. Small pneumobilia, decreased since the prior CT. No retained calcified stone noted in the central CBD. Pancreas: Unremarkable. No pancreatic ductal dilatation or surrounding inflammatory changes. Spleen: Normal in size without focal abnormality. Adrenals/Urinary Tract: The adrenal glands  are unremarkable. There is no hydronephrosis on either side. Small left renal upper pole cyst. No imaging follow-up. There is symmetric enhancement and excretion of contrast by both kidneys. The visualized ureters and urinary bladder appear unremarkable. Stomach/Bowel: There is sigmoid diverticulosis without active inflammatory changes. There is no bowel obstruction or active inflammation. No CT evidence of acute appendicitis. Vascular/Lymphatic: The abdominal aorta and IVC are unremarkable. No portal venous gas. There is no adenopathy. Reproductive: Hysterectomy.  No adnexal masses. Other: Ventral hernia repair mesh. Musculoskeletal: Osteopenia with degenerative changes of the spine. No acute osseous pathology. IMPRESSION: 1. No acute intra-abdominal or pelvic pathology. 2. Sigmoid diverticulosis. No bowel obstruction. Electronically Signed   By: Anner Crete M.D.   On: 10/09/2022 01:55    Procedures Procedures    Medications Ordered in ED Medications  iohexol (OMNIPAQUE) 350 MG/ML injection 75 mL (75 mLs Intravenous Contrast Given 10/09/22 0134)  acetaminophen (TYLENOL) tablet 650 mg (650 mg Oral  Given 10/09/22 0250)  ondansetron (ZOFRAN-ODT) disintegrating tablet 4 mg (4 mg Oral Given 10/09/22 0251)    ED Course/ Medical Decision Making/ A&P Clinical Course as of 10/09/22 0523  Fri Oct 09, 2022  0158 Patient states she is hungry. Given crackers. [KH]  0159 Labs reviewed and are c/w baseline. Patient often with chronic, mild elevation of lipase. This is unchanged today. No leukocytosis or left shift.  [KH]  0159 CT abdomen/pelvis without acute process. No evidence of perforation or abscess. Pending UA. [KH]    Clinical Course User Index [KH] Antonietta Breach, PA-C                             Medical Decision Making Risk OTC drugs. Prescription drug management.   This patient presents to the ED for concern of lower abdominal pain, this involves an extensive number of treatment options, and is a complaint that carries with it a high risk of complications and morbidity.  The differential diagnosis includes intraabdominal abscess vs ruptured viscous vs recurrent diverticulitis vs pSBO vs SBO vs constipation vs UTI   Co morbidities that complicate the patient evaluation  HLD HTN CVA PFO   Additional history obtained:  Additional history obtained from spouse, at bedside External records from outside source obtained and reviewed including CT abdomen/pelvis from 10/02/2022 suggesting early diverticulitis   Lab Tests:  I Ordered, and personally interpreted labs.  The pertinent results include:  Lipase of 74 (stable). Otherwise normal CBC, CMP, UA.   Imaging Studies ordered:  I ordered imaging studies including CT abdomen/pelvis  I independently visualized and interpreted imaging which showed no acute process in the abdomen or pelvis I agree with the radiologist interpretation   Cardiac Monitoring:  The patient was maintained on a cardiac monitor.  I personally viewed and interpreted the cardiac monitored which showed an underlying rhythm of: NSR   Medicines ordered and  prescription drug management:  I ordered medication including Tylenol for pain and Zofran for nausea  Reevaluation of the patient after these medicines showed that the patient improved I have reviewed the patients home medicines and have made adjustments as needed   Test Considered:  N/A   Problem List / ED Course:  As above   Reevaluation:  After the interventions noted above, I reevaluated the patient and found that they have : remained stable   Social Determinants of Health:  Insured patient   Dispostion:  After consideration of the diagnostic results and the patients  response to treatment, I feel that the patent would benefit from completion of her outpatient Augmentin Rx. Encouraged f/u with her PCP and GI specialist. Return precautions discussed and provided. Patient discharged in stable condition with no unaddressed concerns.          Final Clinical Impression(s) / ED Diagnoses Final diagnoses:  Abdominal pain, unspecified abdominal location    Rx / DC Orders ED Discharge Orders     None         Antonietta Breach, PA-C 10/09/22 0529    Fatima Blank, MD 10/09/22 [phone]

## 2022-10-09 NOTE — ED Notes (Signed)
Discharge instructions discussed with pt. Verbalized understanding. VSS. No questions or concerns regarding discharge  

## 2022-10-12 ENCOUNTER — Encounter (HOSPITAL_COMMUNITY): Payer: Self-pay | Admitting: *Deleted

## 2022-10-12 ENCOUNTER — Other Ambulatory Visit: Payer: Self-pay

## 2022-10-12 ENCOUNTER — Emergency Department (HOSPITAL_COMMUNITY)
Admission: EM | Admit: 2022-10-12 | Discharge: 2022-10-13 | Disposition: A | Discharge status: Home / Self Care | Payer: BC Managed Care – PPO | Attending: Emergency Medicine | Admitting: Emergency Medicine

## 2022-10-12 DIAGNOSIS — R1031 Right lower quadrant pain: Secondary | ICD-10-CM | POA: Diagnosis not present

## 2022-10-12 DIAGNOSIS — R110 Nausea: Secondary | ICD-10-CM | POA: Insufficient documentation

## 2022-10-12 DIAGNOSIS — R197 Diarrhea, unspecified: Secondary | ICD-10-CM | POA: Diagnosis not present

## 2022-10-12 DIAGNOSIS — M25562 Pain in left knee: Secondary | ICD-10-CM | POA: Insufficient documentation

## 2022-10-12 DIAGNOSIS — R42 Dizziness and giddiness: Secondary | ICD-10-CM | POA: Insufficient documentation

## 2022-10-12 DIAGNOSIS — R748 Abnormal levels of other serum enzymes: Secondary | ICD-10-CM | POA: Insufficient documentation

## 2022-10-12 DIAGNOSIS — Z1152 Encounter for screening for COVID-19: Secondary | ICD-10-CM | POA: Diagnosis not present

## 2022-10-12 DIAGNOSIS — M25561 Pain in right knee: Secondary | ICD-10-CM | POA: Insufficient documentation

## 2022-10-12 DIAGNOSIS — Z7982 Long term (current) use of aspirin: Secondary | ICD-10-CM | POA: Insufficient documentation

## 2022-10-12 DIAGNOSIS — R1032 Left lower quadrant pain: Secondary | ICD-10-CM | POA: Insufficient documentation

## 2022-10-12 DIAGNOSIS — R1030 Lower abdominal pain, unspecified: Secondary | ICD-10-CM

## 2022-10-12 DIAGNOSIS — R531 Weakness: Secondary | ICD-10-CM | POA: Insufficient documentation

## 2022-10-12 LAB — CBC WITH DIFFERENTIAL/PLATELET
Abs Immature Granulocytes: 0.04 10*3/uL (ref 0.00–0.07)
Basophils Absolute: 0 10*3/uL (ref 0.0–0.1)
Basophils Relative: 1 %
Eosinophils Absolute: 0 10*3/uL (ref 0.0–0.5)
Eosinophils Relative: 0 %
HCT: 40.3 % (ref 36.0–46.0)
Hemoglobin: 13.5 g/dL (ref 12.0–15.0)
Immature Granulocytes: 1 %
Lymphocytes Relative: 31 %
Lymphs Abs: 2.3 10*3/uL (ref 0.7–4.0)
MCH: 32.1 pg (ref 26.0–34.0)
MCHC: 33.5 g/dL (ref 30.0–36.0)
MCV: 95.7 fL (ref 80.0–100.0)
Monocytes Absolute: 0.3 10*3/uL (ref 0.1–1.0)
Monocytes Relative: 4 %
Neutro Abs: 4.8 10*3/uL (ref 1.7–7.7)
Neutrophils Relative %: 63 %
Platelets: 186 10*3/uL (ref 150–400)
RBC: 4.21 MIL/uL (ref 3.87–5.11)
RDW: 12.9 % (ref 11.5–15.5)
WBC: 7.5 10*3/uL (ref 4.0–10.5)
nRBC: 0 % (ref 0.0–0.2)

## 2022-10-12 LAB — COMPREHENSIVE METABOLIC PANEL
ALT: 19 U/L (ref 0–44)
AST: 25 U/L (ref 15–41)
Albumin: 4 g/dL (ref 3.5–5.0)
Alkaline Phosphatase: 48 U/L (ref 38–126)
Anion gap: 9 (ref 5–15)
BUN: 11 mg/dL (ref 8–23)
CO2: 24 mmol/L (ref 22–32)
Calcium: 10.2 mg/dL (ref 8.9–10.3)
Chloride: 103 mmol/L (ref 98–111)
Creatinine, Ser: 1.1 mg/dL — ABNORMAL HIGH (ref 0.44–1.00)
GFR, Estimated: 54 mL/min — ABNORMAL LOW (ref >60)
Glucose, Bld: 107 mg/dL — ABNORMAL HIGH (ref 70–99)
Potassium: 4.2 mmol/L (ref 3.5–5.1)
Sodium: 136 mmol/L (ref 135–145)
Total Bilirubin: 0.5 mg/dL (ref 0.3–1.2)
Total Protein: 6.6 g/dL (ref 6.5–8.1)

## 2022-10-12 LAB — LIPASE, BLOOD: Lipase: 77 U/L — ABNORMAL HIGH (ref 11–51)

## 2022-10-12 NOTE — ED Triage Notes (Signed)
Patient reports she is not feeling well.  She has stomach pain, headache, knee pain and generalized not feeling well.  She was seen here for similar sx on 10/08/22.  Patient states she has had nausea and she has frequent stools for a few days.  Patient states she feels shaky.  Patient is not a diabetic.  Patient denies seeing frank blood in her stools.  Patient is alert.  She has taken her daily medications.

## 2022-10-12 NOTE — ED Provider Triage Note (Signed)
Emergency Medicine Provider Triage Evaluation Note  Rachel Black , a 72 y.o. female  was evaluated in triage.  Pt complains of continuous abdominal pain nausea.  Admitted on 10/03/2022 for diverticulitis.  Seen again in the ED 10/08/2018 for concern for continued abdominal pain.  Reports some increased urinary frequency and bowel movement frequency, though denies overt blood in either.  Denies recent fevers or chills, vomiting, flank pain.  Also states head occasionally has "swimmy" sensation, denies head pain or vision changes.  Review of Systems  Positive:  Negative: See above  Physical Exam  BP 107/74   Pulse 65   Temp 98.7 F (37.1 C)   Resp 17   Ht '5\' 6"'$  (1.676 m)   Wt 80.5 kg   SpO2 100%   BMI 28.65 kg/m  Gen:   Awake, no distress   Resp:  Normal effort  MSK:   Moves extremities without difficulty  Other:  Abdomen soft, protuberant, nondistended, mild generalized tenderness.  Sitting comfortably.  Medical Decision Making  Medically screening exam initiated at 3:35 PM.  Appropriate orders placed.  [name] was informed that the remainder of the evaluation will be completed by another provider, this initial triage assessment does not replace that evaluation, and the importance of remaining in the ED until their evaluation is complete.     Prince Rome, PA-C 62/03/55 1541

## 2022-10-13 LAB — C DIFFICILE QUICK SCREEN W PCR REFLEX
C Diff antigen: NEGATIVE
C Diff interpretation: NOT DETECTED
C Diff toxin: NEGATIVE

## 2022-10-13 LAB — URINALYSIS, ROUTINE W REFLEX MICROSCOPIC
Bacteria, UA: NONE SEEN
Bilirubin Urine: NEGATIVE
Glucose, UA: NEGATIVE mg/dL
Hgb urine dipstick: NEGATIVE
Ketones, ur: 5 mg/dL — AB
Leukocytes,Ua: NEGATIVE
Nitrite: NEGATIVE
Protein, ur: 30 mg/dL — AB
Specific Gravity, Urine: 1.029 (ref 1.005–1.030)
pH: 5 (ref 5.0–8.0)

## 2022-10-13 LAB — RESP PANEL BY RT-PCR (RSV, FLU A&B, COVID)  RVPGX2
Influenza A by PCR: NEGATIVE
Influenza B by PCR: NEGATIVE
Resp Syncytial Virus by PCR: NEGATIVE
SARS Coronavirus 2 by RT PCR: NEGATIVE

## 2022-10-13 MED ORDER — FENTANYL CITRATE PF 50 MCG/ML IJ SOSY
50.0000 ug | PREFILLED_SYRINGE | Freq: Once | INTRAMUSCULAR | Status: AC
  Administered 2022-10-13: 50 ug via INTRAVENOUS
  Filled 2022-10-13: qty 1

## 2022-10-13 MED ORDER — ONDANSETRON HCL 4 MG/2ML IJ SOLN
4.0000 mg | Freq: Once | INTRAMUSCULAR | Status: AC
  Administered 2022-10-13: 4 mg via INTRAVENOUS
  Filled 2022-10-13: qty 2

## 2022-10-13 MED ORDER — SODIUM CHLORIDE 0.9 % IV BOLUS
500.0000 mL | Freq: Once | INTRAVENOUS | Status: AC
  Administered 2022-10-13: 500 mL via INTRAVENOUS

## 2022-10-13 NOTE — Discharge Instructions (Addendum)
You were seen in the emergency department for evaluation of low abdominal pain diarrhea dizziness.  You had lab work that was unremarkable.  You are given fluids and nausea medication.  Stool studies were obtained and the results of the should be back in the next day or 2.  Please continue your regular medications and follow-up with your primary care doctor.  Return to the emergency department if any worsening or concerning symptoms.

## 2022-10-13 NOTE — ED Notes (Signed)
Patient denies N/V and ABD pain after eating crackers. Will continue to monitor

## 2022-10-13 NOTE — ED Notes (Signed)
Patient given water and crackers. Will continue to monitor

## 2022-10-13 NOTE — ED Provider Notes (Signed)
[facility] Provider Note   CSN: [id number] Arrival date & time: 10/12/22  1433     History  Chief Complaint  Patient presents with   Abdominal Pain   Knee Pain        Headache    Rachel Black is a 72 y.o. female.  She was brought in yesterday by her son for evaluation of low abdominal pain diarrhea dizziness nausea and pain in both her knees.  She was admitted earlier this month for diverticulitis.  Treated with Augmentin.  Prior to that she was treated for UTI.  History of bilateral knee replacements with increased pain recently.  No fever.  Nausea no vomiting.  No blood in her stool.  She said she has 1 more dose of her Augmentin to finish due today.  She has been trying to drink a lot of fluids.  Urinating a lot but no dysuria  The history is provided by the patient.  Abdominal Pain Pain location:  RLQ and LLQ Pain quality: aching and cramping   Pain severity:  Moderate Onset quality:  Gradual Duration:  2 days Timing:  Constant Progression:  Unchanged Chronicity:  Recurrent Context: not trauma   Relieved by:  None tried Worsened by:  Nothing Ineffective treatments:  None tried Associated symptoms: diarrhea and nausea   Associated symptoms: no chest pain, no constipation, no dysuria, no fever, no hematemesis, no hematochezia, no hematuria, no shortness of breath and no sore throat   Risk factors: recent hospitalization   Knee Pain Associated symptoms: no fever        Home Medications Prior to Admission medications   Medication Sig Start Date End Date Taking? Authorizing Provider  ACCU-CHEK AVIVA PLUS test strip USE AS INSTRUCTED ONCE A DAY. DX CODE E11.9 07/20/22   Lorrene Reid, PA-C  Accu-Chek Softclix Lancets lancets Use as instructed once a day.  DX Code: E11.9 03/24/22   Lorrene Reid, PA-C  allopurinol (ZYLOPRIM) 300 MG tablet TAKE 1 TABLET BY MOUTH TWICE A DAY 08/27/22   Ronnell Freshwater, NP   amoxicillin-clavulanate (AUGMENTIN) 875-125 MG tablet Take 1 tablet by mouth 2 (two) times daily for 13 doses. 10/06/22 10/13/22  [place] Donnamarie Poag, DO  aspirin 81 MG EC tablet Take 1 tablet (81 mg total) by mouth daily. 10/18/17   Reino Bellis B, NP  CALCIUM PO Take 2 tablets by mouth daily.    [provider]  Emollient (UDDERLY SMOOTH) CREA Apply 1 application topically See admin instructions. Apply topically to skin folds as needed for rash/irritation    [provider]  famotidine (PEPCID) 10 MG tablet Take 10 mg by mouth daily as needed for heartburn or indigestion.    [provider]  fenofibrate 160 MG tablet Take 1 tablet (160 mg total) by mouth daily. 09/10/22   Ronnell Freshwater, NP  ferrous sulfate 325 (65 FE) MG tablet TAKE 1 TABLET BY MOUTH TWICE A DAY Patient taking differently: Take 325 mg by mouth daily. 08/03/22   Zehr, Laban Emperor, PA-C  isosorbide mononitrate (IMDUR) 60 MG 24 hr tablet Take 1 tablet (60 mg total) by mouth daily. Patient taking differently: Take 30 mg by mouth daily. 03/09/22   Minus Breeding, MD  linaclotide Rolan Lipa) 72 MCG capsule Take 1 capsule (72 mcg total) by mouth daily before breakfast. 09/28/22   Boscia, Greer Ee, NP  olmesartan (BENICAR) 5 MG tablet TAKE 1 TABLET (5 MG TOTAL) BY MOUTH DAILY. 07/12/22  Lorrene Reid, PA-C  rosuvastatin (CRESTOR) 40 MG tablet Take 1 tablet (40 mg total) by mouth daily. 03/24/22   Minus Breeding, MD  triamcinolone (KENALOG) 0.025 % cream Apply 1 Application topically 2 (two) times daily. 09/28/22   Ronnell Freshwater, NP      Allergies    Other and Penicillins    Review of Systems   Review of Systems  Constitutional:  Negative for fever.  HENT:  Negative for sore throat.   Respiratory:  Negative for shortness of breath.   Cardiovascular:  Negative for chest pain.  Gastrointestinal:  Positive for abdominal pain, diarrhea and nausea. Negative for constipation, hematemesis and hematochezia.   Genitourinary:  Negative for dysuria and hematuria.  Musculoskeletal:  Positive for arthralgias.  Skin:  Negative for rash.  Neurological:  Positive for dizziness.    Physical Exam Updated Vital Signs BP 124/70 (BP Location: Right Arm)   Pulse 64   Temp 97.6 F (36.4 C) (Oral)   Resp 20   Ht '5\' 6"'$  (1.676 m)   Wt 80.5 kg   SpO2 99%   BMI 28.65 kg/m  Physical Exam Vitals and nursing note reviewed.  Constitutional:      General: She is not in acute distress.    Appearance: Normal appearance. She is well-developed.  HENT:     Head: Normocephalic and atraumatic.  Eyes:     Conjunctiva/sclera: Conjunctivae normal.  Cardiovascular:     Rate and Rhythm: Normal rate and regular rhythm.     Heart sounds: No murmur heard. Pulmonary:     Effort: Pulmonary effort is normal. No respiratory distress.     Breath sounds: Normal breath sounds.  Abdominal:     Palpations: Abdomen is soft.     Tenderness: There is no abdominal tenderness.  Musculoskeletal:        General: No deformity or signs of injury. Normal range of motion.     Cervical back: Neck supple.     Right lower leg: No edema.     Left lower leg: No edema.  Skin:    General: Skin is warm and dry.     Capillary Refill: Capillary refill takes less than 2 seconds.  Neurological:     General: No focal deficit present.     Mental Status: She is alert.     ED Results / Procedures / Treatments   Labs (all labs ordered are listed, but only abnormal results are displayed) Labs Reviewed  COMPREHENSIVE METABOLIC PANEL - Abnormal; Notable for the following components:      Result Value   Glucose, Bld 107 (*)    Creatinine, Ser 1.10 (*)    GFR, Estimated 54 (*)    All other components within normal limits  LIPASE, BLOOD - Abnormal; Notable for the following components:   Lipase 77 (*)    All other components within normal limits  URINALYSIS, ROUTINE W REFLEX MICROSCOPIC - Abnormal; Notable for the following components:    Color, Urine AMBER (*)    APPearance HAZY (*)    Ketones, ur 5 (*)    Protein, ur 30 (*)    All other components within normal limits  RESP PANEL BY RT-PCR (RSV, FLU A&B, COVID)  [id number]  GASTROINTESTINAL PANEL BY PCR, STOOL (REPLACES STOOL CULTURE)  C DIFFICILE QUICK SCREEN W PCR REFLEX    CBC WITH DIFFERENTIAL/PLATELET    EKG None  Radiology No results found.  Procedures Procedures    Medications Ordered in ED Medications  sodium  chloride 0.9 % bolus 500 mL (has no administration in time range)  ondansetron (ZOFRAN) injection 4 mg (has no administration in time range)  fentaNYL (SUBLIMAZE) injection 50 mcg (has no administration in time range)    ED Course/ Medical Decision Making/ A&P                             Medical Decision Making Risk Prescription drug management.   This patient complains of diarrhea general weakness; this involves an extensive number of treatment Options and is a complaint that carries with it a high risk of complications and morbidity. The differential includes dehydration, diverticulitis, diverticulosis, UTI, metabolic derangement  I ordered, reviewed and interpreted labs, which included CBC normal, chemistries fairly normal other than mild elevation in creatinine, urinalysis without signs of infection, lipase mildly elevated, COVID and flu negative, stool sent for C. difficile and GI panel I ordered medication IV fluids nausea medication pain medication and reviewed PMP when indicated. Additional history obtained from patient's son Previous records obtained and reviewed in epic including recent discharge summary and subsequent ED visit  Cardiac monitoring reviewed, normal sinus rhythm Social determinants considered, no significant barriers Critical Interventions: None  After the interventions stated above, I reevaluated the patient and found patient to be well-appearing in no distress Admission and further testing considered, no  indications for admission at this time. recommended close follow-up with PCP.  Return instructions discussed         Final Clinical Impression(s) / ED Diagnoses Final diagnoses:  Lower abdominal pain  Diarrhea, unspecified type  Dizziness    Rx / DC Orders ED Discharge Orders     None         Hayden Rasmussen, MD 10/13/22 1715

## 2022-10-14 LAB — GASTROINTESTINAL PANEL BY PCR, STOOL (REPLACES STOOL CULTURE)

## 2022-10-20 ENCOUNTER — Encounter (HOSPITAL_COMMUNITY): Payer: Self-pay | Admitting: Emergency Medicine

## 2022-10-20 ENCOUNTER — Emergency Department (HOSPITAL_COMMUNITY)
Admission: EM | Admit: 2022-10-20 | Discharge: 2022-10-20 | Disposition: A | Discharge status: Home / Self Care | Payer: BC Managed Care – PPO | Attending: Emergency Medicine | Admitting: Emergency Medicine

## 2022-10-20 ENCOUNTER — Other Ambulatory Visit: Payer: Self-pay

## 2022-10-20 DIAGNOSIS — Z96653 Presence of artificial knee joint, bilateral: Secondary | ICD-10-CM | POA: Diagnosis not present

## 2022-10-20 DIAGNOSIS — Z7982 Long term (current) use of aspirin: Secondary | ICD-10-CM | POA: Insufficient documentation

## 2022-10-20 DIAGNOSIS — M25562 Pain in left knee: Secondary | ICD-10-CM | POA: Insufficient documentation

## 2022-10-20 MED ORDER — LIDOCAINE 5 % EX PTCH
1.0000 | MEDICATED_PATCH | CUTANEOUS | Status: DC
  Administered 2022-10-20: 1 via TRANSDERMAL
  Filled 2022-10-20: qty 1

## 2022-10-20 MED ORDER — HYDROCODONE-ACETAMINOPHEN 5-325 MG PO TABS
1.0000 | ORAL_TABLET | Freq: Once | ORAL | Status: AC
  Administered 2022-10-20: 1 via ORAL
  Filled 2022-10-20: qty 1

## 2022-10-20 MED ORDER — HYDROCODONE-ACETAMINOPHEN 5-325 MG PO TABS: 1.0000 | ORAL_TABLET | ORAL | 0 refills | Status: DC | PRN

## 2022-10-20 NOTE — ED Notes (Signed)
Patient verbalizes understanding of discharge instructions. Opportunity for questioning and answers were provided. Armband removed by staff, pt discharged from ED. Pt taken to ED waiting room via wheel chair.  

## 2022-10-20 NOTE — Discharge Instructions (Signed)
Norco as needed as prescribed for pain.  Can apply Voltaren gel topically as needed as directed.  Follow up with orthopedics, call to schedule an appointment.

## 2022-10-20 NOTE — ED Provider Notes (Signed)
[facility] Provider Note   CSN: [id number] Arrival date & time: 10/20/22  1728     History {Add pertinent medical, surgical, social history, OB history to HPI:1} Chief Complaint  Patient presents with  . Knee Pain    Rachel Black is a 72 y.o. female.  HPI     Home Medications Prior to Admission medications   Medication Sig Start Date End Date Taking? Authorizing Provider  ACCU-CHEK AVIVA PLUS test strip USE AS INSTRUCTED ONCE A DAY. DX CODE E11.9 07/20/22   Lorrene Reid, PA-C  Accu-Chek Softclix Lancets lancets Use as instructed once a day.  DX Code: E11.9 03/24/22   Lorrene Reid, PA-C  allopurinol (ZYLOPRIM) 300 MG tablet TAKE 1 TABLET BY MOUTH TWICE A DAY 08/27/22   Ronnell Freshwater, NP  aspirin 81 MG EC tablet Take 1 tablet (81 mg total) by mouth daily. 10/18/17   Reino Bellis B, NP  CALCIUM PO Take 2 tablets by mouth daily.    [provider]  Emollient (UDDERLY SMOOTH) CREA Apply 1 application topically See admin instructions. Apply topically to skin folds as needed for rash/irritation    [provider]  famotidine (PEPCID) 10 MG tablet Take 10 mg by mouth daily as needed for heartburn or indigestion.    [provider]  fenofibrate 160 MG tablet Take 1 tablet (160 mg total) by mouth daily. 09/10/22   Ronnell Freshwater, NP  ferrous sulfate 325 (65 FE) MG tablet TAKE 1 TABLET BY MOUTH TWICE A DAY Patient taking differently: Take 325 mg by mouth daily. 08/03/22   Zehr, Laban Emperor, PA-C  isosorbide mononitrate (IMDUR) 60 MG 24 hr tablet Take 1 tablet (60 mg total) by mouth daily. Patient taking differently: Take 30 mg by mouth daily. 03/09/22   Minus Breeding, MD  linaclotide Rolan Lipa) 72 MCG capsule Take 1 capsule (72 mcg total) by mouth daily before breakfast. 09/28/22   Boscia, Greer Ee, NP  olmesartan (BENICAR) 5 MG tablet TAKE 1 TABLET (5 MG TOTAL) BY MOUTH DAILY. 07/12/22   Lorrene Reid,  PA-C  rosuvastatin (CRESTOR) 40 MG tablet Take 1 tablet (40 mg total) by mouth daily. 03/24/22   Minus Breeding, MD  triamcinolone (KENALOG) 0.025 % cream Apply 1 Application topically 2 (two) times daily. 09/28/22   Ronnell Freshwater, NP      Allergies    Other and Penicillins    Review of Systems   Review of Systems  Physical Exam Updated Vital Signs BP 113/81 (BP Location: Right Arm)   Pulse 85   Temp 98.9 F (37.2 C) (Oral)   Resp 17   Ht '5\' 6"'$  (1.676 m)   Wt 80.3 kg   SpO2 100%   BMI 28.57 kg/m  Physical Exam  ED Results / Procedures / Treatments   Labs (all labs ordered are listed, but only abnormal results are displayed) Labs Reviewed - No data to display  EKG None  Radiology No results found.  Procedures Procedures  {Document cardiac monitor, telemetry assessment procedure when appropriate:1}  Medications Ordered in ED Medications - No data to display  ED Course/ Medical Decision Making/ A&P   {   Click here for ABCD2, HEART and other calculatorsREFRESH Note before signing :1}                          Medical Decision Making  ***  {Document critical care time when appropriate:1} {  Document review of labs and clinical decision tools ie heart score, Chads2Vasc2 etc:1}  {Document your independent review of radiology images, and any outside records:1} {Document your discussion with family members, caretakers, and with consultants:1} {Document social determinants of health affecting pt's care:1} {Document your decision making why or why not admission, treatments were needed:1} Final Clinical Impression(s) / ED Diagnoses Final diagnoses:  None    Rx / DC Orders ED Discharge Orders     None

## 2022-10-20 NOTE — ED Provider Triage Note (Signed)
Emergency Medicine Provider Triage Evaluation Note  Rachel Black , a 72 y.o. female  was evaluated in triage.  Pt complains of bilateral knee pain left worse than right.  She reports that she has an appoint with orthopedic later this month.  No new falls.  She has been here previous times this month for the same pain.  She tried Tylenol today but has not tried any other relief.  Denies any fevers.  Review of Systems  Positive:  Negative:   Physical Exam  BP 113/81 (BP Location: Right Arm)   Pulse 85   Temp 98.9 F (37.2 C) (Oral)   Resp 17   Ht '5\' 6"'$  (1.676 m)   Wt 80.3 kg   SpO2 100%   BMI 28.57 kg/m  Gen:   Awake, no distress   Resp:  Normal effort  MSK:   Moves extremities without difficulty  Other:  Compartments are soft.  Temperature and color.  Feel symmetric.  Palpable pulses.  She can wiggle her ankles and flex and extend her knee.  No overlying warmth to the knees.  Faint surgical scars overlying.  Medical Decision Making  Medically screening exam initiated at 7:00 PM.  Appropriate orders placed.  [name] was informed that the remainder of the evaluation will be completed by another provider, this initial triage assessment does not replace that evaluation, and the importance of remaining in the ED until their evaluation is complete.     Sherrell Puller, [place] 10/20/22 1901

## 2022-10-20 NOTE — ED Triage Notes (Signed)
Bilateral knee pain for 2 weeks. Left worse than right. Pt denies any new injury has had knee caps replaced on both knees in the past.

## 2022-10-21 ENCOUNTER — Ambulatory Visit: Payer: BC Managed Care – PPO | Admitting: Physician Assistant

## 2022-10-21 ENCOUNTER — Encounter: Payer: Self-pay | Admitting: Physician Assistant

## 2022-10-21 DIAGNOSIS — M25562 Pain in left knee: Secondary | ICD-10-CM

## 2022-10-21 DIAGNOSIS — M25561 Pain in right knee: Secondary | ICD-10-CM | POA: Diagnosis not present

## 2022-10-21 NOTE — Progress Notes (Addendum)
Office Visit Note   Patient: Rachel Black           Date of Birth: 10-14-1950           MRN: [id number] Visit Date: 10/21/2022              Requested by: Lorrene Reid, PA-C No address on file PCP: Lorrene Reid, PA-C  Chief Complaint  Patient presents with   Left Knee - Pain      HPI: Rachel Black comes in today with her husband.  She has had bilateral knee replacements in the past with Dr. Ninfa Linden.  Most recent one was in 2016.  She has been in the emergency room twice in the last week or so complaining of bilateral knee pain.  She denies any injuries or falls.  She denies any fever or chills.  X-rays do not demonstrate any significant concerns she does have some osteopenia.  She admits she does not do much exercise and is very sedentary just going from her bed to her chair using a walker.   Assessment & Plan: Visit Diagnoses: Bilateral knee pain  Plan: I do not see any sign of infection or periprosthetic issues.  She does have some pain medially could have some micro loosening of the components.  She is very deconditioned and has osteopenia.  Apparently she has been put on a high-dose vitamin D regimen before but currently is working to get into see a new primary care.  She has had many visits to the emergency room in the last year for multiple GI complaints without a significant cause.  She also has had a stroke that affected her left side.  I spoke with her and her husband at length.  I do think she would do well given her deconditioning to do some physical therapy.  She is homebound and her husband works and I be happy to arrange for this to be done by home health.  She is just not sure she wants to do that.  Also have offered to refer her to behavioral health to talk with the social worker as she seems to have a flat affect and her husband think she just does not want to do anything anymore.  Again this was declined.  If she has any concerns they may contact me.  They do have an  appointment to see Dr. Ninfa Linden.  If there is concerns for loosening of the components related they can do further studies such as a bone scan.  I do not see any reason to necessarily order this today without her first touching base with Dr. Ninfa Linden.  In the meantime I recommend Tylenol as well as topical Voltaren gel.  She says she was given another pill at the ER for prescription last night.  This was for hydrocodone.  Given her inactivity I discussed with her that this would be sedating and would cause some significant constipation.  She says she does not think she will take that at this time  Follow-Up Instructions: No follow-ups on file.   Ortho Exam  Patient is alert, oriented, no adenopathy, well-dressed, normal affect, normal respiratory effort. Bilateral knees she has well-healed surgical incisions.  She has quite a bit of lower extremity atrophy in her quads and her lower legs.  No redness no effusions compartments are soft and nontender she is neurovascularly intact  Imaging: No results found. No images are attached to the encounter.  Labs: Lab Results  Component Value Date  HGBA1C 5.6 09/28/2022   HGBA1C 5.3 03/23/2022   HGBA1C 5.1 12/24/2021   CRP 3.6 (H) 04/29/2020   CRP 4.4 (H) 04/28/2020   CRP 7.8 (H) 04/27/2020   LABURIC 3.5 12/21/2017   LABURIC 3.4 09/21/2017   LABURIC 3.5 04/10/2014   REPTSTATUS 04/29/2020 FINAL 04/28/2020   CULT (A) 04/28/2020    >=100,000 COLONIES/mL MULTIPLE SPECIES PRESENT, SUGGEST RECOLLECTION   LABORGA Multiple bacterial morphotypes present, none 11/20/2014   LABORGA predominant. Suggest appropriate recollection if 11/20/2014   LABORGA clinically indicated. 11/20/2014     Lab Results  Component Value Date   ALBUMIN 4.0 10/12/2022   ALBUMIN 3.6 10/08/2022   ALBUMIN 3.9 10/03/2022    Lab Results  Component Value Date   MG 2.0 10/06/2022   MG 1.6 (L) 10/05/2022   MG 2.1 04/04/2022   Lab Results  Component Value Date   VD25OH  42.0 09/24/2020   VD25OH 28.2 (L) 09/10/2020   VD25OH 20.7 (L) 11/27/2019    No results found for: "PREALBUMIN"    Latest Ref Rng & Units 10/12/2022    3:41 PM 10/08/2022    5:05 PM 10/05/2022    2:09 AM  CBC EXTENDED  WBC 4.0 - 10.5 K/uL 7.5  4.5  4.5   RBC 3.87 - 5.11 MIL/uL 4.21  3.77  3.24   Hemoglobin 12.0 - 15.0 g/dL 13.5  12.3  10.4   HCT 36.0 - 46.0 % 40.3  35.9  31.5   Platelets 150 - 400 K/uL 186  155  121   NEUT# 1.7 - 7.7 K/uL 4.8  2.3    Lymph# 0.7 - 4.0 K/uL 2.3  1.9       There is no height or weight on file to calculate BMI.  Orders:  No orders of the defined types were placed in this encounter.  No orders of the defined types were placed in this encounter.    Procedures: No procedures performed  Clinical Data: No additional findings.  ROS:  All other systems negative, except as noted in the HPI. Review of Systems  Objective: Vital Signs: There were no vitals taken for this visit.  Specialty Comments:  No specialty comments available.  PMFS History: Patient Active Problem List   Diagnosis Date Noted   Left hip pain 10/04/2022   Bilateral knee pain 10/04/2022   Acute diverticulitis 10/03/2022   Hypertension associated with diabetes ([place]) 03/23/2022   Epigastric pain 03/16/2022   Constipation 03/16/2022   Influenza B 09/16/2021   Acute cystitis without hematuria 09/16/2021   Closed nondisplaced fracture of metatarsal bone of right foot, initial encounter 01/75/1025   Acute metabolic encephalopathy 85/27/7824   Cryptogenic stroke ([place]) 01/13/2021   GERD without esophagitis 11/26/2020   Iron deficiency 11/26/2020   Generalized abdominal pain 11/26/2020   Pneumonia due to COVID-19 virus    Mixed hyperlipidemia due to type 2 diabetes mellitus ([place]) 11/27/2019   Family history of breast cancer in mother 11/27/2019   Cataract of both eyes 11/27/2019   Coronary artery disease involving native coronary artery of native heart without angina  pectoris 02/18/2018   NSTEMI (non-ST elevated myocardial infarction) [facility])    Chest pain of uncertain etiology 23/53/6144   Need for shingles vaccine 09/21/2017   Need for pneumococcal vaccine 09/21/2017   History of rheumatic fever 06/30/2017   PFO (patent foramen ovale)    Cerebrovascular accident (CVA) due to embolism of right middle cerebral artery ([place]) 03/14/2017   Dilation of biliary tract  Elevated LFTs    RUQ abdominal pain    Acute gallstone pancreatitis 12/07/2016   Choledocholithiasis with obstruction 12/07/2016   AKI (acute kidney injury) ([place]) 12/07/2016   Osteoarthritis of right knee 08/23/2015   Status post total left knee replacement 08/23/2015   Arthritis of knee, right 04/20/2014   Status post total right knee replacement 04/20/2014   Flushing reaction 03/10/2012   Diabetes mellitus ([place]) 02/17/2007   Mixed hyperlipidemia 02/17/2007   GOUT 02/17/2007   Severe obesity (BMI >= 40) ([place]) 02/17/2007   Essential hypertension 02/17/2007   RHEUMATIC FEVER, HX OF 02/17/2007   Past Medical History:  Diagnosis Date   Arthritis    PAIN AND OA BOTH KNEES AND SHOULDERS AND ELBOWS AND WRIST   Blood transfusion without reported diagnosis 2018   after cholecystectomy   Broken foot, right, closed, initial encounter 09/15/2021   no surgery needed, fell at home and went to ED   Diabetes mellitus    ORAL MEDICATION   GERD (gastroesophageal reflux disease)    Gout    NO RECENT FLARE UPS   H/O: rheumatic fever    AS A CHILD - NO KNOWN HEART MURMUR OR HEART PROBLEMS   Heart attack ([place])    Hyperlipidemia    Hypertension    PFO (patent foramen ovale)    Stroke ([place]) 03/2017    Family History  Problem Relation Age of Onset   Diabetes Mother    Hypertension Mother        entire family   Breast cancer Mother        diagnosed in her 13's   Stroke Father        CVA   Hyperlipidemia Father        entire family   Diabetes Father    Heart disease Father        No  details.  Not at an early age.     Crohn's disease Son    Irritable bowel syndrome Son    Colon cancer Neg Hx    Colon polyps Neg Hx    Esophageal cancer Neg Hx    Stomach cancer Neg Hx    Rectal cancer Neg Hx     Past Surgical History:  Procedure Laterality Date   ABDOMINAL HYSTERECTOMY     CESAREAN SECTION     CHOLECYSTECTOMY N/A 12/09/2016   Procedure: LAPAROSCOPIC CHOLECYSTECTOMY WITH INTRAOPERATIVE CHOLANGIOGRAM;  Surgeon: Stark Klein, MD;  Location: [facility];  Service: General;  Laterality: N/A;   COLONOSCOPY     COLONOSCOPY WITH PROPOFOL N/A 01/30/2013   Procedure: COLONOSCOPY WITH PROPOFOL;  Surgeon: Irene Shipper, MD;  Location: [facility];  Service: Endoscopy;  Laterality: N/A;   ERCP N/A 12/08/2016   Procedure: ENDOSCOPIC RETROGRADE CHOLANGIOPANCREATOGRAPHY (ERCP);  Surgeon: Ladene Artist, MD;  Location: [facility];  Service: Endoscopy;  Laterality: N/A;   ESOPHAGOGASTRODUODENOSCOPY (EGD) WITH PROPOFOL N/A 01/30/2013   Procedure: ESOPHAGOGASTRODUODENOSCOPY (EGD) WITH PROPOFOL;  Surgeon: Irene Shipper, MD;  Location: [facility];  Service: Endoscopy;  Laterality: N/A;   LEFT HEART CATH AND CORONARY ANGIOGRAPHY N/A 10/18/2017   Procedure: LEFT HEART CATH AND CORONARY ANGIOGRAPHY;  Surgeon: Martinique, Peter M, MD;  Location: [facility];  Service: Cardiovascular;  Laterality: N/A;   LOOP RECORDER INSERTION N/A 03/18/2017   Procedure: Loop Recorder Insertion;  Surgeon: Constance Haw, MD;  Location: [facility];  Service: Cardiovascular;  Laterality: N/A;   PARTIAL HYSTERECTOMY     TEE WITHOUT CARDIOVERSION N/A  03/16/2017   Procedure: TRANSESOPHAGEAL ECHOCARDIOGRAM (TEE);  Surgeon: Acie Fredrickson Wonda Cheng, MD;  Location: [facility] END[facility];  Service: Cardiovascular;  Laterality: N/A;   TOTAL KNEE ARTHROPLASTY Right 04/20/2014   Procedure: RIGHT TOTAL KNEE ARTHROPLASTY CONVERTED TO RIGHT KNEE REIMPLANTATION;  Surgeon: Mcarthur Rossetti, MD;  Location: [facility];   Service: Orthopedics;  Laterality: Right;   TOTAL KNEE ARTHROPLASTY Left 08/23/2015   Procedure: LEFT TOTAL KNEE ARTHROPLASTY;  Surgeon: Mcarthur Rossetti, MD;  Location: [facility];  Service: Orthopedics;  Laterality: Left;   VENTRAL HERNIA REPAIR     x2   Social History   Occupational History   Occupation: housewife    Employer: UNEMPLOYED  Tobacco Use   Smoking status: Never   Smokeless tobacco: Never  Vaping Use   Vaping Use: Never used  Substance and Sexual Activity   Alcohol use: Not Currently    Comment: rarely in the past   Drug use: Never   Sexual activity: Yes    Partners: Male

## 2022-10-22 ENCOUNTER — Telehealth: Payer: Self-pay | Admitting: *Deleted

## 2022-10-22 NOTE — Telephone Encounter (Signed)
Pts husband left message requesting a refill on below. Vinie Charity Zimmerman Rumple, CMA    olmesartan (BENICAR) 5 MG tablet   [id number] [place] [facility] [address] [place]   LOV:09/28/22  ROV:11/18/22

## 2022-10-23 ENCOUNTER — Other Ambulatory Visit: Payer: Self-pay

## 2022-10-23 DIAGNOSIS — E1159 Type 2 diabetes mellitus with other circulatory complications: Secondary | ICD-10-CM

## 2022-10-23 MED ORDER — OLMESARTAN MEDOXOMIL 5 MG PO TABS: 5.0000 mg | ORAL_TABLET | Freq: Every day | ORAL | 0 refills | Status: DC

## 2022-10-23 NOTE — Telephone Encounter (Signed)
Rx was sent  

## 2022-10-25 DIAGNOSIS — R002 Palpitations: Secondary | ICD-10-CM | POA: Insufficient documentation

## 2022-10-25 NOTE — Progress Notes (Deleted)
Cardiology Office Note   Date:  10/25/2022   ID:  Rachel, Black 06/30/1951, MRN [id number]  PCP:  Lorrene Reid, PA-C  Cardiologist:   Minus Breeding, MD   No chief complaint on file.     History of Present Illness: Rachel Black is a 72 y.o. female who presents for follow-up of coronary artery disease.  She was admitted in February 2019.  She had a non-STEMI.  She had an occluded second marginal.  She was managed medically.  She has had a cryptogenic stroke.  She has an implanted loop recorder.  She did have a PFO on TEE.     Since I last saw her ***  *** in June she has been in the ED 8 times.  She had no sustained arrhythmias.  Symptoms did not occur necessarily with her PVCs or occasional junctional beats.  She does not describe chest discomfort and I did go back and review to emergency room consult by our team.  What she is really describing is palpitations.  However, there were no significant dysrhythmias on the monitor despite having palpitations.  She was on beta-blockers after 1 consultation in late June but taken off of this at the beginning of July because of some bradycardia arrhythmias on the monitor and telemetry.  She has not noticed any difference.  She is not currently having new symptoms.  She will feel it skipping occasionally.  However, with me she does not describe chest pressure, neck or arm discomfort.  She has not had any presyncope or syncope.  She had no new shortness of breath, PND or orthopnea.  She gets around slowly with her house because of knee pain   Past Medical History:  Diagnosis Date   Arthritis    PAIN AND OA BOTH KNEES AND SHOULDERS AND ELBOWS AND WRIST   Blood transfusion without reported diagnosis 2018   after cholecystectomy   Broken foot, right, closed, initial encounter 09/15/2021   no surgery needed, fell at home and went to ED   Diabetes mellitus    ORAL MEDICATION   GERD (gastroesophageal reflux disease)    Gout    NO  RECENT FLARE UPS   H/O: rheumatic fever    AS A CHILD - NO KNOWN HEART MURMUR OR HEART PROBLEMS   Heart attack ([place])    Hyperlipidemia    Hypertension    PFO (patent foramen ovale)    Stroke ([place]) 03/2017    Past Surgical History:  Procedure Laterality Date   ABDOMINAL HYSTERECTOMY     CESAREAN SECTION     CHOLECYSTECTOMY N/A 12/09/2016   Procedure: LAPAROSCOPIC CHOLECYSTECTOMY WITH INTRAOPERATIVE CHOLANGIOGRAM;  Surgeon: Stark Klein, MD;  Location: [facility];  Service: General;  Laterality: N/A;   COLONOSCOPY     COLONOSCOPY WITH PROPOFOL N/A 01/30/2013   Procedure: COLONOSCOPY WITH PROPOFOL;  Surgeon: Irene Shipper, MD;  Location: [facility];  Service: Endoscopy;  Laterality: N/A;   ERCP N/A 12/08/2016   Procedure: ENDOSCOPIC RETROGRADE CHOLANGIOPANCREATOGRAPHY (ERCP);  Surgeon: Ladene Artist, MD;  Location: [facility];  Service: Endoscopy;  Laterality: N/A;   ESOPHAGOGASTRODUODENOSCOPY (EGD) WITH PROPOFOL N/A 01/30/2013   Procedure: ESOPHAGOGASTRODUODENOSCOPY (EGD) WITH PROPOFOL;  Surgeon: Irene Shipper, MD;  Location: [facility];  Service: Endoscopy;  Laterality: N/A;   LEFT HEART CATH AND CORONARY ANGIOGRAPHY N/A 10/18/2017   Procedure: LEFT HEART CATH AND CORONARY ANGIOGRAPHY;  Surgeon: Martinique, Peter M, MD;  Location: [facility];  Service: Cardiovascular;  Laterality: N/A;   LOOP RECORDER INSERTION N/A 03/18/2017   Procedure: Loop Recorder Insertion;  Surgeon: Constance Haw, MD;  Location: [facility];  Service: Cardiovascular;  Laterality: N/A;   PARTIAL HYSTERECTOMY     TEE WITHOUT CARDIOVERSION N/A 03/16/2017   Procedure: TRANSESOPHAGEAL ECHOCARDIOGRAM (TEE);  Surgeon: Acie Fredrickson Wonda Cheng, MD;  Location: [facility];  Service: Cardiovascular;  Laterality: N/A;   TOTAL KNEE ARTHROPLASTY Right 04/20/2014   Procedure: RIGHT TOTAL KNEE ARTHROPLASTY CONVERTED TO RIGHT KNEE REIMPLANTATION;  Surgeon: Mcarthur Rossetti, MD;  Location: [facility];  Service:  Orthopedics;  Laterality: Right;   TOTAL KNEE ARTHROPLASTY Left 08/23/2015   Procedure: LEFT TOTAL KNEE ARTHROPLASTY;  Surgeon: Mcarthur Rossetti, MD;  Location: [facility];  Service: Orthopedics;  Laterality: Left;   VENTRAL HERNIA REPAIR     x2     Current Outpatient Medications  Medication Sig Dispense Refill   ACCU-CHEK AVIVA PLUS test strip USE AS INSTRUCTED ONCE A DAY. DX CODE E11.9 100 strip 1   Accu-Chek Softclix Lancets lancets Use as instructed once a day.  DX Code: E11.9 100 each 1   allopurinol (ZYLOPRIM) 300 MG tablet TAKE 1 TABLET BY MOUTH TWICE A DAY 180 tablet 1   aspirin 81 MG EC tablet Take 1 tablet (81 mg total) by mouth daily.     CALCIUM PO Take 2 tablets by mouth daily.     Emollient (UDDERLY SMOOTH) CREA Apply 1 application topically See admin instructions. Apply topically to skin folds as needed for rash/irritation     famotidine (PEPCID) 10 MG tablet Take 10 mg by mouth daily as needed for heartburn or indigestion.     fenofibrate 160 MG tablet Take 1 tablet (160 mg total) by mouth daily. 90 tablet 1   ferrous sulfate 325 (65 FE) MG tablet TAKE 1 TABLET BY MOUTH TWICE A DAY (Patient taking differently: Take 325 mg by mouth daily.) 180 tablet 3   HYDROcodone-acetaminophen (NORCO/VICODIN) 5-325 MG tablet Take 1 tablet by mouth every 4 (four) hours as needed. 10 tablet 0   isosorbide mononitrate (IMDUR) 60 MG 24 hr tablet Take 1 tablet (60 mg total) by mouth daily. (Patient taking differently: Take 30 mg by mouth daily.) 90 tablet 3   linaclotide (LINZESS) 72 MCG capsule Take 1 capsule (72 mcg total) by mouth daily before breakfast. 30 capsule 2   olmesartan (BENICAR) 5 MG tablet Take 1 tablet (5 mg total) by mouth daily. 90 tablet 0   rosuvastatin (CRESTOR) 40 MG tablet Take 1 tablet (40 mg total) by mouth daily. 90 tablet 2   triamcinolone (KENALOG) 0.025 % cream Apply 1 Application topically 2 (two) times daily. 80 g 2   No current facility-administered medications  for this visit.    Allergies:   Other and Penicillins    ROS:  Please see the history of present illness.   Otherwise, review of systems are positive for ***  .   All other systems are reviewed and negative.    PHYSICAL EXAM: VS:  There were no vitals taken for this visit. , BMI There is no height or weight on file to calculate BMI.  GENERAL:  Well appearing NECK:  No jugular venous distention, waveform within normal limits, carotid upstroke brisk and symmetric, no bruits, no thyromegaly LUNGS:  Clear to auscultation bilaterally CHEST:  Unremarkable HEART:  PMI not displaced or sustained,S1 and S2 within normal limits, no S3, no S4, no clicks, no rubs, ***  murmurs ABD:  Flat, positive bowel sounds normal in frequency in pitch, no bruits, no rebound, no guarding, no midline pulsatile mass, no hepatomegaly, no splenomegaly EXT:  2 plus pulses throughout, no edema, no cyanosis no clubbing    ***GENERAL:  Well appearing NECK:  No jugular venous distention, waveform within normal limits, carotid upstroke brisk and symmetric, no bruits, no thyromegaly LUNGS:  Clear to auscultation bilaterally CHEST:  Unremarkable HEART:  PMI not displaced or sustained,S1 and S2 within normal limits, no S3, no S4, no clicks, no rubs, no murmurs ABD:  Flat, positive bowel sounds normal in frequency in pitch, no bruits, no rebound, no guarding, no midline pulsatile mass, no hepatomegaly, no splenomegaly EXT:  2 plus pulses throughout, no edema, no cyanosis no clubbing'    EKG:  EKG is *** ordered today.   Cath  Diagnostic Dominance: Right    Recent Labs: 04/04/2022: TSH 1.845 10/06/2022: Magnesium 2.0 10/12/2022: ALT 19; BUN 11; Creatinine, Ser 1.10; Hemoglobin 13.5; Platelets 186; Potassium 4.2; Sodium 136    Lipid Panel    Component Value Date/Time   CHOL 119 08/26/2021 1050   TRIG 213 (H) 08/26/2021 1050   HDL 31 (L) 08/26/2021 1050   CHOLHDL 3.8 08/26/2021 1050   CHOLHDL 4 12/21/2017  1044   VLDL 59.8 (H) 12/21/2017 1044   LDLCALC 53 08/26/2021 1050   LDLDIRECT 63 11/27/2019 1428   LDLDIRECT 50.0 12/21/2017 1044      Wt Readings from Last 3 Encounters:  10/20/22 177 lb (80.3 kg)  10/12/22 177 lb 8 oz (80.5 kg)  10/01/22 182 lb (82.6 kg)      Other studies Reviewed: Additional studies/ records that were reviewed today include: ***    ASSESSMENT AND PLAN:  CAD:   *** The patient had single branch vessel disease.  She is not really describing chest discomfort.  No further testing is suggested at this time and she can continue with the nitrates.  If chest discomfort becomes a predominant complaint we could consider repeat catheterization to look for microvascular disease.   HTN:    The blood pressure is *** at target.  No change in therapy.   PALPITATIONS:  ***  I think that she does have occasional skipped beats.  It is not made better by beta-blockers.  I do not think there is a need for further antiarrhythmics.  There were no sustained arrhythmias and symptoms did not necessarily correlate.  No further testing.   PFO:   ***  This was an incidental finding.  No change in therapy.    Current medicines are reviewed at length with the patient today.  The patient does not have concerns regarding medicines.  The following changes have been made: ***  Labs/ tests ordered today include: ***   No orders of the defined types were placed in this encounter.    Disposition:   FU with APP in *** months.     Signed, Minus Breeding, MD  10/25/2022 8:15 PM    [facility]

## 2022-10-27 ENCOUNTER — Ambulatory Visit: Payer: BC Managed Care – PPO | Admitting: Cardiology

## 2022-10-27 DIAGNOSIS — I2510 Atherosclerotic heart disease of native coronary artery without angina pectoris: Secondary | ICD-10-CM

## 2022-10-27 DIAGNOSIS — R002 Palpitations: Secondary | ICD-10-CM

## 2022-10-27 DIAGNOSIS — I10 Essential (primary) hypertension: Secondary | ICD-10-CM

## 2022-10-27 DIAGNOSIS — Q2112 Patent foramen ovale: Secondary | ICD-10-CM

## 2022-11-01 DIAGNOSIS — M064 Inflammatory polyarthropathy: Secondary | ICD-10-CM | POA: Insufficient documentation

## 2022-11-01 DIAGNOSIS — L209 Atopic dermatitis, unspecified: Secondary | ICD-10-CM | POA: Insufficient documentation

## 2022-11-09 ENCOUNTER — Ambulatory Visit (INDEPENDENT_AMBULATORY_CARE_PROVIDER_SITE_OTHER): Payer: BC Managed Care – PPO | Admitting: Orthopaedic Surgery

## 2022-11-09 ENCOUNTER — Other Ambulatory Visit: Payer: Self-pay

## 2022-11-09 DIAGNOSIS — M25561 Pain in right knee: Secondary | ICD-10-CM | POA: Diagnosis not present

## 2022-11-09 DIAGNOSIS — M25562 Pain in left knee: Secondary | ICD-10-CM

## 2022-11-09 DIAGNOSIS — Z96653 Presence of artificial knee joint, bilateral: Secondary | ICD-10-CM

## 2022-11-09 NOTE — Progress Notes (Signed)
The patient is well-known to me.  She is 72 years old and we replaced her right knee in 2015 and her left knee in 2016.  At that time she was morbidly obese and probably weighed over 70 pounds more than she does now.  Since I have seen her she had had a stroke and this did affect her left side and she is lost a significant amount of weight.  She says both knees been hurting for several weeks now and they feel like they are going to give out.  Both knees were assessed today and showed no effusion.  Range of motion is full in both knees and I feel ligamentously stable.  X-rays from last month of both knees show well-seated total knee arthroplasties with no complicating features.  He can tell the big difference with compared to films from 2015 and 16 of how her knees look now in terms of significant weight loss.  I have recommended physical therapy for her knees to strengthen the muscles around her knees as well as knee sleeves which could certainly help give her some compression and help them feel more stable to her.  I would like to see her back in 6 weeks after course of therapy.  She agrees with this treatment plan.

## 2022-11-18 ENCOUNTER — Ambulatory Visit: Payer: BC Managed Care – PPO | Admitting: Nurse Practitioner

## 2022-11-26 ENCOUNTER — Other Ambulatory Visit: Payer: Self-pay | Admitting: Physician Assistant

## 2022-11-26 ENCOUNTER — Encounter (HOSPITAL_COMMUNITY): Payer: Self-pay

## 2022-11-26 ENCOUNTER — Emergency Department (HOSPITAL_COMMUNITY): Payer: BC Managed Care – PPO

## 2022-11-26 ENCOUNTER — Telehealth: Payer: Self-pay | Admitting: Orthopaedic Surgery

## 2022-11-26 ENCOUNTER — Emergency Department (HOSPITAL_COMMUNITY)
Admission: EM | Admit: 2022-11-26 | Discharge: 2022-11-26 | Disposition: A | Discharge status: Home / Self Care | Payer: BC Managed Care – PPO | Attending: Emergency Medicine | Admitting: Emergency Medicine

## 2022-11-26 DIAGNOSIS — M25562 Pain in left knee: Secondary | ICD-10-CM | POA: Diagnosis not present

## 2022-11-26 DIAGNOSIS — Z7982 Long term (current) use of aspirin: Secondary | ICD-10-CM | POA: Diagnosis not present

## 2022-11-26 DIAGNOSIS — G8929 Other chronic pain: Secondary | ICD-10-CM | POA: Insufficient documentation

## 2022-11-26 DIAGNOSIS — E119 Type 2 diabetes mellitus without complications: Secondary | ICD-10-CM | POA: Insufficient documentation

## 2022-11-26 DIAGNOSIS — M25561 Pain in right knee: Secondary | ICD-10-CM | POA: Insufficient documentation

## 2022-11-26 NOTE — ED Triage Notes (Signed)
Pt came in via POV d/t bil knee pain that she has been having & seen a specialist for & does not want to do therapy. Came here today for another evaluation to see if anything else can be found. Rates pain 7/10 while in triage, A/Ox4.

## 2022-11-26 NOTE — Telephone Encounter (Signed)
Called patient encouraged her to go to PT which she has not done.  She states that therapy most likely will not help.  She denies any injury to either knee.  She does not feel like she has any warmth or swelling of either knee.  No fevers or chills.  She has been using Tylenol patches and creams.  She states that she is thinking about going to the ER because knee pain is so that she cannot stand it.  Did offer her follow-up here in our office she said she would let us know.

## 2022-11-26 NOTE — Telephone Encounter (Signed)
Patient called. Says she is in a lot of pain. Would like to know what to do. Her call back number is [phone]

## 2022-11-26 NOTE — ED Provider Notes (Signed)
[facility] Provider Note   CSN: [id number] Arrival date & time: 11/26/22  1834     History  Chief Complaint  Patient presents with   Bil Knee Pain    Rachel Black is a 72 y.o. female with a past medical history of diabetes, osteoarthritis of the bilateral knees, total knee replacement of the bilateral knees presenting today with knee pain.  She says that it is chronic but her left knee is hurting more often than usual.  She says that it hurts even to get up and walk.  She saw Dr. Ninfa Linden last month who suggested physical therapy, elevation and compression.  She says that she has not been able to find a brace that is comfortable.  She also says that the pain is too severe and she cannot do physical therapy.  HPI     Home Medications Prior to Admission medications   Medication Sig Start Date End Date Taking? Authorizing Provider  ACCU-CHEK AVIVA PLUS test strip USE AS INSTRUCTED ONCE A DAY. DX CODE E11.9 07/20/22   Lorrene Reid, PA-C  Accu-Chek Softclix Lancets lancets Use as instructed once a day.  DX Code: E11.9 03/24/22   Lorrene Reid, PA-C  allopurinol (ZYLOPRIM) 300 MG tablet TAKE 1 TABLET BY MOUTH TWICE A DAY 08/27/22   Ronnell Freshwater, NP  aspirin 81 MG EC tablet Take 1 tablet (81 mg total) by mouth daily. 10/18/17   Reino Bellis B, NP  CALCIUM PO Take 2 tablets by mouth daily.    [provider]  Emollient (UDDERLY SMOOTH) CREA Apply 1 application topically See admin instructions. Apply topically to skin folds as needed for rash/irritation    [provider]  famotidine (PEPCID) 10 MG tablet Take 10 mg by mouth daily as needed for heartburn or indigestion.    [provider]  fenofibrate 160 MG tablet Take 1 tablet (160 mg total) by mouth daily. 09/10/22   Ronnell Freshwater, NP  ferrous sulfate 325 (65 FE) MG tablet TAKE 1 TABLET BY MOUTH TWICE A DAY Patient taking differently: Take 325 mg by  mouth daily. 08/03/22   Zehr, Laban Emperor, PA-C  HYDROcodone-acetaminophen (NORCO/VICODIN) 5-325 MG tablet Take 1 tablet by mouth every 4 (four) hours as needed. 10/20/22   Tacy Learn, PA-C  isosorbide mononitrate (IMDUR) 60 MG 24 hr tablet Take 1 tablet (60 mg total) by mouth daily. Patient taking differently: Take 30 mg by mouth daily. 03/09/22   Minus Breeding, MD  linaclotide Rolan Lipa) 72 MCG capsule Take 1 capsule (72 mcg total) by mouth daily before breakfast. 09/28/22   Ronnell Freshwater, NP  olmesartan (BENICAR) 5 MG tablet Take 1 tablet (5 mg total) by mouth daily. 10/23/22   Ronnell Freshwater, NP  rosuvastatin (CRESTOR) 40 MG tablet Take 1 tablet (40 mg total) by mouth daily. 03/24/22   Minus Breeding, MD  triamcinolone (KENALOG) 0.025 % cream Apply 1 Application topically 2 (two) times daily. 09/28/22   Ronnell Freshwater, NP      Allergies    Other and Penicillins    Review of Systems   Review of Systems  Physical Exam Updated Vital Signs BP 112/86 (BP Location: Left Arm)   Pulse 75   Temp 98.5 F (36.9 C)   Resp 16   SpO2 99%  Physical Exam Vitals and nursing note reviewed.  Constitutional:      Appearance: Normal appearance.  HENT:     Head:  Normocephalic and atraumatic.  Eyes:     General: No scleral icterus.    Conjunctiva/sclera: Conjunctivae normal.  Pulmonary:     Effort: Pulmonary effort is normal. No respiratory distress.  Musculoskeletal:     Comments: Full range of motion of the bilateral knees.  No overlying cellulitis, well-healed surgical scars.  Negative ligamental testing.  Strong DP pulses bilaterally  Skin:    Findings: No rash.  Neurological:     Mental Status: She is alert.  Psychiatric:        Mood and Affect: Mood normal.     ED Results / Procedures / Treatments   Labs (all labs ordered are listed, but only abnormal results are displayed) Labs Reviewed - No data to display  EKG None  Radiology DG Knee Complete 4 Views Left  Result  Date: 11/26/2022 CLINICAL DATA:  Chronic knee pain increasing in severity EXAM: LEFT KNEE - COMPLETE 4+ VIEW; RIGHT KNEE - COMPLETE 4+ VIEW COMPARISON:  10/01/2022 FINDINGS: Right knee: Frontal, bilateral oblique, lateral views are obtained. 3 component right knee arthroplasty is identified in stable position without evidence of acute complication. No acute fracture. No joint effusion. Soft tissues are unremarkable. Left knee: Frontal, bilateral oblique, lateral views are obtained. Three component left knee arthroplasty is identified in stable position without evidence of acute complication. No acute fracture. No joint effusion. Soft tissues are unremarkable. IMPRESSION: 1. Stable unremarkable bilateral knee arthroplasties. No acute bony abnormalities. Electronically Signed   By: Randa Ngo M.D.   On: 11/26/2022 20:19   DG Knee Complete 4 Views Right  Result Date: 11/26/2022 CLINICAL DATA:  Chronic knee pain increasing in severity EXAM: LEFT KNEE - COMPLETE 4+ VIEW; RIGHT KNEE - COMPLETE 4+ VIEW COMPARISON:  10/01/2022 FINDINGS: Right knee: Frontal, bilateral oblique, lateral views are obtained. 3 component right knee arthroplasty is identified in stable position without evidence of acute complication. No acute fracture. No joint effusion. Soft tissues are unremarkable. Left knee: Frontal, bilateral oblique, lateral views are obtained. Three component left knee arthroplasty is identified in stable position without evidence of acute complication. No acute fracture. No joint effusion. Soft tissues are unremarkable. IMPRESSION: 1. Stable unremarkable bilateral knee arthroplasties. No acute bony abnormalities. Electronically Signed   By: Randa Ngo M.D.   On: 11/26/2022 20:19    Procedures Procedures   Medications Ordered in ED Medications - No data to display  ED Course/ Medical Decision Making/ A&P                             Medical Decision Making Amount and/or Complexity of Data  Reviewed Radiology: ordered.    72 year old female presenting today with pain to the bilateral knees.  Bilateral lower extremities is neurovascularly intact.  There are no obvious deformities.  X-ray ordered, reviewed and interpreted by me and I agree with radiology that there are no acute findings.  I discussed that her pain is chronic numerous times.  She will need to follow-up with orthopedics.  Ultimately, she was encouraged to try physical therapy before she says that she cannot do it.  NSAIDs and Tylenol are appropriate for pain control and inflammation.    Will also continue with Voltaren.  Neurovascularly intact and stable for discharge.  Impression(s) / ED Diagnoses Final diagnoses:  Chronic pain of both knees    Rx / DC Orders ED Discharge Orders     None      Results and diagnoses  were explained to the patient. Return precautions discussed in full. Patient had no additional questions and expressed complete understanding.   This chart was dictated using voice recognition software.  Despite best efforts to proofread,  errors can occur which can change the documentation meaning.    Darliss Ridgel 11/26/22 2027    Wyvonnia Dusky, MD 11/26/22 2137

## 2022-11-26 NOTE — Discharge Instructions (Addendum)
You came to the emergency department today with knee pain.  This is a chronic problem, your x-rays do not show anything new.  You will need to follow-up with Dr. Ninfa Linden.  I do suggest that you compress, ice and elevate your knees while resting and it will be very important for you to try to do physical therapy.  It was a pleasure to meet you and we hope you feel better!  Keep using the medications the orthopedics has prescribed you.

## 2022-12-03 ENCOUNTER — Other Ambulatory Visit: Payer: Self-pay | Admitting: Physician Assistant

## 2022-12-03 MED ORDER — ACETAMINOPHEN-CODEINE 300-30 MG PO TABS: 1.0000 | ORAL_TABLET | ORAL | 0 refills | Status: DC | PRN

## 2022-12-15 ENCOUNTER — Other Ambulatory Visit: Payer: Self-pay | Admitting: Nurse Practitioner

## 2022-12-15 DIAGNOSIS — K581 Irritable bowel syndrome with constipation: Secondary | ICD-10-CM

## 2022-12-21 ENCOUNTER — Ambulatory Visit (INDEPENDENT_AMBULATORY_CARE_PROVIDER_SITE_OTHER): Payer: BC Managed Care – PPO | Admitting: Orthopaedic Surgery

## 2022-12-21 ENCOUNTER — Encounter: Payer: Self-pay | Admitting: Orthopaedic Surgery

## 2022-12-21 DIAGNOSIS — M25561 Pain in right knee: Secondary | ICD-10-CM

## 2022-12-21 DIAGNOSIS — M25562 Pain in left knee: Secondary | ICD-10-CM

## 2022-12-21 DIAGNOSIS — Z96653 Presence of artificial knee joint, bilateral: Secondary | ICD-10-CM | POA: Diagnosis not present

## 2022-12-21 MED ORDER — GABAPENTIN 300 MG PO CAPS: 300.0000 mg | ORAL_CAPSULE | Freq: Every day | ORAL | 1 refills | Status: DC

## 2022-12-21 NOTE — Progress Notes (Signed)
The patient has a remote history of bilateral knee replacements.  When I saw her last she had been recovering from a stroke and has significant left-sided weakness.  She is now unfortunately developed chronic pain in both her knees.  I have x-rayed both knees and did not see any worrisome features of the knee replacements.  She actually went to the emergency room recently in March and x-rays were obtained of both knees and again showed no evidence of effusion or complicating features.  There is no warmth in either knee and she denies any fever or chills.  She has not been to any type of physical therapy.  After her stroke she lost at least 70 pounds.  Both knees were assessed today and both knees show no effusion.  Both knees are not warm.  Both knees have pretty good range of motion overall but they are just weak.  She does report daily pain.  She has tried knee sleeves but only wore these minimal.  She has not had any type of physical therapy and seems reluctant to try therapy.  I gave her reassurance that the x-rays look good with her knees but I would like to send her to physical therapy to really work on strengthening the muscles around her knees.  She is not sleeping well at night so we will try some Neurontin to see if this will help with pain pathways.  Will see her back in 6 weeks to see how she is done after course of therapy and Neurontin.

## 2022-12-22 ENCOUNTER — Other Ambulatory Visit: Payer: BC Managed Care – PPO

## 2022-12-22 ENCOUNTER — Other Ambulatory Visit: Payer: Self-pay

## 2022-12-22 DIAGNOSIS — Z96653 Presence of artificial knee joint, bilateral: Secondary | ICD-10-CM

## 2022-12-26 ENCOUNTER — Other Ambulatory Visit: Payer: Self-pay

## 2022-12-26 ENCOUNTER — Emergency Department (HOSPITAL_COMMUNITY)
Admission: EM | Admit: 2022-12-26 | Discharge: 2022-12-26 | Disposition: A | Discharge status: Home / Self Care | Payer: BC Managed Care – PPO | Attending: Emergency Medicine | Admitting: Emergency Medicine

## 2022-12-26 ENCOUNTER — Emergency Department (HOSPITAL_COMMUNITY): Payer: BC Managed Care – PPO

## 2022-12-26 DIAGNOSIS — I2510 Atherosclerotic heart disease of native coronary artery without angina pectoris: Secondary | ICD-10-CM | POA: Diagnosis not present

## 2022-12-26 DIAGNOSIS — Z8673 Personal history of transient ischemic attack (TIA), and cerebral infarction without residual deficits: Secondary | ICD-10-CM | POA: Diagnosis not present

## 2022-12-26 DIAGNOSIS — Z7982 Long term (current) use of aspirin: Secondary | ICD-10-CM | POA: Insufficient documentation

## 2022-12-26 DIAGNOSIS — Z79899 Other long term (current) drug therapy: Secondary | ICD-10-CM | POA: Diagnosis not present

## 2022-12-26 DIAGNOSIS — E119 Type 2 diabetes mellitus without complications: Secondary | ICD-10-CM | POA: Diagnosis not present

## 2022-12-26 DIAGNOSIS — Z96653 Presence of artificial knee joint, bilateral: Secondary | ICD-10-CM | POA: Insufficient documentation

## 2022-12-26 DIAGNOSIS — G8929 Other chronic pain: Secondary | ICD-10-CM | POA: Insufficient documentation

## 2022-12-26 DIAGNOSIS — M25562 Pain in left knee: Secondary | ICD-10-CM | POA: Diagnosis not present

## 2022-12-26 MED ORDER — OXYCODONE-ACETAMINOPHEN 5-325 MG PO TABS
1.0000 | ORAL_TABLET | Freq: Once | ORAL | Status: AC
  Administered 2022-12-26: 1 via ORAL
  Filled 2022-12-26: qty 1

## 2022-12-26 NOTE — Discharge Instructions (Signed)
We evaluated you for your knee pain.  Your x-ray was reassuring.  We did not see any signs of a blood clot on your physical examination.  We also did not see any signs of an infection.  Please continue the Tylenol every 6 hours as well as the Voltaren and lidocaine patches.  You can also try to take the gabapentin 3 times daily.  Please also ice the knee.  Follow-up with physical therapy as ordered by Dr. Rayburn Ma.   Please return to the emergency department if you develop any redness, heat around the knee, swelling, fevers and chills, or any other concerning symptoms.

## 2022-12-26 NOTE — ED Triage Notes (Signed)
Pt reports chronic L knee pain for which she sees Dr. Magnus Ivan. Pt woke up this morning and pain was worse than normal, unrelieved by Tylenol. Denies recent injury.

## 2022-12-26 NOTE — ED Provider Notes (Signed)
[facility]  CSN: [id number] Arrival date & time: 12/26/22 1037  Chief Complaint(s) Knee Pain  HPI Rachel Black is a 72 y.o. female with history of diabetes, hypertension, hyperlipidemia, left knee replacement presenting to the emergency department with left knee pain.  She reports that she chronically has left knee pain but she woke up today and it was worse.  She is seeing Dr. Magnus Ivan recently for her knee pain and was referred to physical therapy but she has not yet started.  He prescribed her gabapentin and she is taking a few times at night but not during the daytime.  She reports she has been using Tylenol, lidocaine patches and Voltaren gel.  No fevers or chills.  No new trauma.  Pain radiates up the left leg a little bit.  No leg swelling.  No redness.  Feels similar to chronic pain.   Past Medical History Past Medical History:  Diagnosis Date   Arthritis    PAIN AND OA BOTH KNEES AND SHOULDERS AND ELBOWS AND WRIST   Blood transfusion without reported diagnosis 2018   after cholecystectomy   Broken foot, right, closed, initial encounter 09/15/2021   no surgery needed, fell at home and went to ED   Diabetes mellitus    ORAL MEDICATION   GERD (gastroesophageal reflux disease)    Gout    NO RECENT FLARE UPS   H/O: rheumatic fever    AS A CHILD - NO KNOWN HEART MURMUR OR HEART PROBLEMS   Heart attack    Hyperlipidemia    Hypertension    PFO (patent foramen ovale)    Stroke 03/2017   Patient Active Problem List   Diagnosis Date Noted   Atopic dermatitis 11/01/2022   Inflammatory polyarthropathy 11/01/2022   Palpitations 10/25/2022   Left hip pain 10/04/2022   Bilateral knee pain 10/04/2022   Acute diverticulitis 10/03/2022   Hypertension associated with diabetes 03/23/2022   Epigastric pain 03/16/2022   Irritable bowel syndrome with constipation 03/16/2022   Influenza B 09/16/2021   Acute cystitis without  hematuria 09/16/2021   Closed nondisplaced fracture of metatarsal bone of right foot, initial encounter 09/16/2021   Acute metabolic encephalopathy 09/15/2021   Cryptogenic stroke 01/13/2021   GERD without esophagitis 11/26/2020   Iron deficiency 11/26/2020   Generalized abdominal pain 11/26/2020   Pneumonia due to COVID-19 virus    Mixed hyperlipidemia due to type 2 diabetes mellitus 11/27/2019   Family history of breast cancer in mother 11/27/2019   Cataract of both eyes 11/27/2019   Coronary artery disease involving native coronary artery of native heart without angina pectoris 02/18/2018   NSTEMI (non-ST elevated myocardial infarction)    Chest pain of uncertain etiology 10/16/2017   Need for shingles vaccine 09/21/2017   Need for pneumococcal vaccine 09/21/2017   History of rheumatic fever 06/30/2017   PFO (patent foramen ovale)    Cerebrovascular accident (CVA) due to embolism of right middle cerebral artery 03/14/2017   Dilation of biliary tract    Elevated LFTs    RUQ abdominal pain    Acute gallstone pancreatitis 12/07/2016   Choledocholithiasis with obstruction 12/07/2016   AKI (acute kidney injury) 12/07/2016   Osteoarthritis of right knee 08/23/2015   Status post total left knee replacement 08/23/2015   Arthritis of knee, right 04/20/2014   Status post total right knee replacement 04/20/2014   Flushing reaction 03/10/2012   Diabetes mellitus 02/17/2007   Mixed hyperlipidemia 02/17/2007  GOUT 02/17/2007   Severe obesity (BMI >= 40) (HCC) 02/17/2007   Essential hypertension 02/17/2007   RHEUMATIC FEVER, HX OF 02/17/2007   Home Medication(s) Prior to Admission medications   Medication Sig Start Date End Date Taking? Authorizing Provider  ACCU-CHEK AVIVA PLUS test strip USE AS INSTRUCTED ONCE A DAY. DX CODE E11.9 07/20/22   Mayer Masker, PA-C  Accu-Chek Softclix Lancets lancets Use as instructed once a day.  DX Code: E11.9 03/24/22   Mayer Masker, PA-C   acetaminophen-codeine (TYLENOL #3) 300-30 MG tablet Take 1 tablet by mouth every 4 (four) hours as needed for moderate pain. 12/03/22   Kirtland Bouchard, PA-C  allopurinol (ZYLOPRIM) 300 MG tablet TAKE 1 TABLET BY MOUTH TWICE A DAY 08/27/22   Carlean Jews, NP  aspirin 81 MG EC tablet Take 1 tablet (81 mg total) by mouth daily. 10/18/17   Laverda Page B, NP  CALCIUM PO Take 2 tablets by mouth daily.    [provider]  Emollient (UDDERLY SMOOTH) CREA Apply 1 application topically See admin instructions. Apply topically to skin folds as needed for rash/irritation    [provider]  famotidine (PEPCID) 10 MG tablet Take 10 mg by mouth daily as needed for heartburn or indigestion.    [provider]  fenofibrate 160 MG tablet Take 1 tablet (160 mg total) by mouth daily. 09/10/22   Carlean Jews, NP  ferrous sulfate 325 (65 FE) MG tablet TAKE 1 TABLET BY MOUTH TWICE A DAY Patient taking differently: Take 325 mg by mouth daily. 08/03/22   Zehr, Princella Pellegrini, PA-C  gabapentin (NEURONTIN) 300 MG capsule Take 1 capsule (300 mg total) by mouth at bedtime. 12/21/22   Kathryne Hitch, MD  isosorbide mononitrate (IMDUR) 60 MG 24 hr tablet Take 1 tablet (60 mg total) by mouth daily. Patient taking differently: Take 30 mg by mouth daily. 03/09/22   Rollene Rotunda, MD  LINZESS 72 MCG capsule TAKE 1 CAPSULE BY MOUTH DAILY BEFORE BREAKFAST. 12/15/22   Saralyn Pilar A, PA  olmesartan (BENICAR) 5 MG tablet Take 1 tablet (5 mg total) by mouth daily. 10/23/22   Carlean Jews, NP  rosuvastatin (CRESTOR) 40 MG tablet Take 1 tablet (40 mg total) by mouth daily. 03/24/22   Rollene Rotunda, MD  triamcinolone (KENALOG) 0.025 % cream Apply 1 Application topically 2 (two) times daily. 09/28/22   Carlean Jews, NP                                                                                                                                    Past Surgical History Past Surgical  History:  Procedure Laterality Date   ABDOMINAL HYSTERECTOMY     CESAREAN SECTION     CHOLECYSTECTOMY N/A 12/09/2016   Procedure: LAPAROSCOPIC CHOLECYSTECTOMY WITH INTRAOPERATIVE CHOLANGIOGRAM;  Surgeon: Almond Lint, MD;  Location: [facility];  Service: General;  Laterality: N/A;   COLONOSCOPY  COLONOSCOPY WITH PROPOFOL N/A 01/30/2013   Procedure: COLONOSCOPY WITH PROPOFOL;  Surgeon: Hilarie Fredrickson, MD;  Location: [facility];  Service: Endoscopy;  Laterality: N/A;   ERCP N/A 12/08/2016   Procedure: ENDOSCOPIC RETROGRADE CHOLANGIOPANCREATOGRAPHY (ERCP);  Surgeon: Meryl Dare, MD;  Location: [facility];  Service: Endoscopy;  Laterality: N/A;   ESOPHAGOGASTRODUODENOSCOPY (EGD) WITH PROPOFOL N/A 01/30/2013   Procedure: ESOPHAGOGASTRODUODENOSCOPY (EGD) WITH PROPOFOL;  Surgeon: Hilarie Fredrickson, MD;  Location: [facility];  Service: Endoscopy;  Laterality: N/A;   LEFT HEART CATH AND CORONARY ANGIOGRAPHY N/A 10/18/2017   Procedure: LEFT HEART CATH AND CORONARY ANGIOGRAPHY;  Surgeon: [place], Peter M, MD;  Location: [facility] INVASIVE CV LAB;  Service: Cardiovascular;  Laterality: N/A;   LOOP RECORDER INSERTION N/A 03/18/2017   Procedure: Loop Recorder Insertion;  Surgeon: Regan Lemming, MD;  Location: [facility] INVASIVE CV LAB;  Service: Cardiovascular;  Laterality: N/A;   PARTIAL HYSTERECTOMY     TEE WITHOUT CARDIOVERSION N/A 03/16/2017   Procedure: TRANSESOPHAGEAL ECHOCARDIOGRAM (TEE);  Surgeon: Elease Hashimoto Deloris Ping, MD;  Location: [facility] END[facility];  Service: Cardiovascular;  Laterality: N/A;   TOTAL KNEE ARTHROPLASTY Right 04/20/2014   Procedure: RIGHT TOTAL KNEE ARTHROPLASTY CONVERTED TO RIGHT KNEE REIMPLANTATION;  Surgeon: Kathryne Hitch, MD;  Location: [facility];  Service: Orthopedics;  Laterality: Right;   TOTAL KNEE ARTHROPLASTY Left 08/23/2015   Procedure: LEFT TOTAL KNEE ARTHROPLASTY;  Surgeon: Kathryne Hitch, MD;  Location: [facility];  Service: Orthopedics;  Laterality: Left;   VENTRAL  HERNIA REPAIR     x2   Family History Family History  Problem Relation Age of Onset   Diabetes Mother    Hypertension Mother        entire family   Breast cancer Mother        diagnosed in her 30's   Stroke Father        CVA   Hyperlipidemia Father        entire family   Diabetes Father    Heart disease Father        No details.  Not at an early age.     Crohn's disease Son    Irritable bowel syndrome Son    Colon cancer Neg Hx    Colon polyps Neg Hx    Esophageal cancer Neg Hx    Stomach cancer Neg Hx    Rectal cancer Neg Hx     Social History Social History   Tobacco Use   Smoking status: Never   Smokeless tobacco: Never  Vaping Use   Vaping Use: Never used  Substance Use Topics   Alcohol use: Not Currently    Comment: rarely in the past   Drug use: Never   Allergies Other and Penicillins  Review of Systems Review of Systems  All other systems reviewed and are negative.   Physical Exam Vital Signs  I have reviewed the triage vital signs BP 91/63 (BP Location: Left Arm)   Pulse (!) 57   Temp 98.5 F (36.9 C) (Oral)   Resp 18   SpO2 99%  Physical Exam Vitals and nursing note reviewed.  Constitutional:      General: She is not in acute distress.    Appearance: She is well-developed.  HENT:     Head: Normocephalic and atraumatic.     Mouth/Throat:     Mouth: Mucous membranes are moist.  Eyes:     Pupils: Pupils are equal, round, and reactive to light.  Cardiovascular:  Rate and Rhythm: Normal rate and regular rhythm.     Heart sounds: No murmur heard. Pulmonary:     Effort: Pulmonary effort is normal. No respiratory distress.     Breath sounds: Normal breath sounds.  Abdominal:     General: Abdomen is flat.     Palpations: Abdomen is soft.     Tenderness: There is no abdominal tenderness.  Musculoskeletal:        General: No tenderness.     Right lower leg: No edema.     Left lower leg: No edema.     Comments: Full range of motion of  the left lower extremity, no tenderness, no erythema or warmth, no knee effusion, 2+ DP pulses bilaterally.  Skin:    General: Skin is warm and dry.  Neurological:     General: No focal deficit present.     Mental Status: She is alert. Mental status is at baseline.  Psychiatric:        Mood and Affect: Mood normal.        Behavior: Behavior normal.     ED Results and Treatments Labs (all labs ordered are listed, but only abnormal results are displayed) Labs Reviewed - No data to display                                                                                                                        Radiology DG Knee 2 Views Left  Result Date: 12/26/2022 CLINICAL DATA:  Left knee pain. EXAM: LEFT KNEE - 1-2 VIEW COMPARISON:  November 26, 2022. FINDINGS: Status post left total knee arthroplasty. No fracture or dislocation is noted. IMPRESSION: No acute abnormality seen. Electronically Signed   By: Lupita Raider M.D.   On: 12/26/2022 11:24    Pertinent labs & imaging results that were available during my care of the patient were reviewed by me and considered in my medical decision making (see MDM for details).  Medications Ordered in ED Medications  oxyCODONE-acetaminophen (PERCOCET/ROXICET) 5-325 MG per tablet 1 tablet (has no administration in time range)                                                                                                                                     Procedures Procedures  (including critical care time)  Medical Decision Making / ED Course   MDM:  72 year old female presenting to the emergency  department with chronic left knee pain.  No new trauma.  X-ray without evidence of fracture.  Clinically, no erythema, swelling, warmth to suggest infection.  No painful range of motion.  No leg swelling to suggest DVT or clot.  Range of motion also intact throughout the remainder of the lower extremity including the hip and the ankle.  Distal  pulses are normal.  Suspect symptoms related to chronic knee pain.  Advised following up with physical therapy, continue current medications and trial gabapentin 3 times daily. Will discharge patient to home. All questions answered. Patient comfortable with plan of discharge. Return precautions discussed with patient and specified on the after visit summary.       Additional history obtained: -Additional history obtained from spouse -External records from outside source obtained and reviewed including: Chart review including previous notes, labs, imaging, consultation notes including Dr. Rayburn Ma note 4/8   Imaging Studies ordered: I ordered imaging studies including XR left knee On my interpretation imaging demonstrates no acute process I independently visualized and interpreted imaging. I agree with the radiologist interpretation   Medicines ordered and prescription drug management: Meds ordered this encounter  Medications   oxyCODONE-acetaminophen (PERCOCET/ROXICET) 5-325 MG per tablet 1 tablet    -I have reviewed the patients home medicines and have made adjustments as needed   Reevaluation: After the interventions noted above, I reevaluated the patient and found that their symptoms have improved  Co morbidities that complicate the patient evaluation  Past Medical History:  Diagnosis Date   Arthritis    PAIN AND OA BOTH KNEES AND SHOULDERS AND ELBOWS AND WRIST   Blood transfusion without reported diagnosis 2018   after cholecystectomy   Broken foot, right, closed, initial encounter 09/15/2021   no surgery needed, fell at home and went to ED   Diabetes mellitus    ORAL MEDICATION   GERD (gastroesophageal reflux disease)    Gout    NO RECENT FLARE UPS   H/O: rheumatic fever    AS A CHILD - NO KNOWN HEART MURMUR OR HEART PROBLEMS   Heart attack    Hyperlipidemia    Hypertension    PFO (patent foramen ovale)    Stroke 03/2017      Dispostion: Disposition  decision including need for hospitalization was considered, and patient discharged from emergency department.    Final Clinical Impression(s) / ED Diagnoses Final diagnoses:  Chronic knee pain after total replacement of left knee joint     This chart was dictated using voice recognition software.  Despite best efforts to proofread,  errors can occur which can change the documentation meaning.    Lonell Grandchild, MD 12/26/22 1218

## 2022-12-28 ENCOUNTER — Other Ambulatory Visit: Payer: Self-pay

## 2022-12-28 ENCOUNTER — Encounter: Payer: Self-pay | Admitting: Physical Therapy

## 2022-12-28 ENCOUNTER — Ambulatory Visit (INDEPENDENT_AMBULATORY_CARE_PROVIDER_SITE_OTHER): Payer: BC Managed Care – PPO | Admitting: Physical Therapy

## 2022-12-28 ENCOUNTER — Encounter: Payer: BC Managed Care – PPO | Admitting: Family Medicine

## 2022-12-28 DIAGNOSIS — R262 Difficulty in walking, not elsewhere classified: Secondary | ICD-10-CM

## 2022-12-28 DIAGNOSIS — M25562 Pain in left knee: Secondary | ICD-10-CM | POA: Diagnosis not present

## 2022-12-28 DIAGNOSIS — M25561 Pain in right knee: Secondary | ICD-10-CM | POA: Diagnosis not present

## 2022-12-28 DIAGNOSIS — G8929 Other chronic pain: Secondary | ICD-10-CM

## 2022-12-28 DIAGNOSIS — M6281 Muscle weakness (generalized): Secondary | ICD-10-CM

## 2022-12-28 NOTE — Therapy (Signed)
OUTPATIENT PHYSICAL THERAPY LOWER EXTREMITY EVALUATION   Patient Name: Rachel Black MRN: [id number] DOB:02-28-1951, 72 y.o., female Today's Date: 12/28/2022  END OF SESSION:  PT End of Session - 12/28/22 1537     Visit Number 1    Number of Visits 20    Date for PT Re-Evaluation 03/12/23    PT Start Time 1434    PT Stop Time 1520    PT Time Calculation (min) 46 min    Activity Tolerance Patient tolerated treatment well;Patient limited by pain    Behavior During Therapy Anxious;WFL for tasks assessed/performed             Past Medical History:  Diagnosis Date   Arthritis    PAIN AND OA BOTH KNEES AND SHOULDERS AND ELBOWS AND WRIST   Blood transfusion without reported diagnosis 2018   after cholecystectomy   Broken foot, right, closed, initial encounter 09/15/2021   no surgery needed, fell at home and went to ED   Diabetes mellitus    ORAL MEDICATION   GERD (gastroesophageal reflux disease)    Gout    NO RECENT FLARE UPS   H/O: rheumatic fever    AS A CHILD - NO KNOWN HEART MURMUR OR HEART PROBLEMS   Heart attack    Hyperlipidemia    Hypertension    PFO (patent foramen ovale)    Stroke 03/2017   Past Surgical History:  Procedure Laterality Date   ABDOMINAL HYSTERECTOMY     CESAREAN SECTION     CHOLECYSTECTOMY N/A 12/09/2016   Procedure: LAPAROSCOPIC CHOLECYSTECTOMY WITH INTRAOPERATIVE CHOLANGIOGRAM;  Surgeon: Almond Lint, MD;  Location: [facility];  Service: General;  Laterality: N/A;   COLONOSCOPY     COLONOSCOPY WITH PROPOFOL N/A 01/30/2013   Procedure: COLONOSCOPY WITH PROPOFOL;  Surgeon: Hilarie Fredrickson, MD;  Location: [facility];  Service: Endoscopy;  Laterality: N/A;   ERCP N/A 12/08/2016   Procedure: ENDOSCOPIC RETROGRADE CHOLANGIOPANCREATOGRAPHY (ERCP);  Surgeon: Meryl Dare, MD;  Location: [facility];  Service: Endoscopy;  Laterality: N/A;   ESOPHAGOGASTRODUODENOSCOPY (EGD) WITH PROPOFOL N/A 01/30/2013   Procedure: ESOPHAGOGASTRODUODENOSCOPY (EGD)  WITH PROPOFOL;  Surgeon: Hilarie Fredrickson, MD;  Location: [facility];  Service: Endoscopy;  Laterality: N/A;   LEFT HEART CATH AND CORONARY ANGIOGRAPHY N/A 10/18/2017   Procedure: LEFT HEART CATH AND CORONARY ANGIOGRAPHY;  Surgeon: [place], Peter M, MD;  Location: [facility] INVASIVE CV [facility];  Service: Cardiovascular;  Laterality: N/A;   LOOP RECORDER INSERTION N/A 03/18/2017   Procedure: Loop Recorder Insertion;  Surgeon: Regan Lemming, MD;  Location: [facility]VAS[facility] CV [facility];  Service: Cardiovascular;  Laterality: N/A;   PARTIAL HYSTERECTOMY     TEE WITHOUT CARDIOVERSION N/A 03/16/2017   Procedure: TRANSESOPHAGEAL ECHOCARDIOGRAM (TEE);  Surgeon: Elease Hashimoto Deloris Ping, MD;  Location: [facility];  Service: Cardiovascular;  Laterality: N/A;   TOTAL KNEE ARTHROPLASTY Right 04/20/2014   Procedure: RIGHT TOTAL KNEE ARTHROPLASTY CONVERTED TO RIGHT KNEE REIMPLANTATION;  Surgeon: Kathryne Hitch, MD;  Location: [facility];  Service: Orthopedics;  Laterality: Right;   TOTAL KNEE ARTHROPLASTY Left 08/23/2015   Procedure: LEFT TOTAL KNEE ARTHROPLASTY;  Surgeon: Kathryne Hitch, MD;  Location: [facility];  Service: Orthopedics;  Laterality: Left;   VENTRAL HERNIA REPAIR     x2   Patient Active Problem List   Diagnosis Date Noted   Atopic dermatitis 11/01/2022   Inflammatory polyarthropathy 11/01/2022   Palpitations 10/25/2022   Left hip pain 10/04/2022   Bilateral knee pain 10/04/2022   Acute diverticulitis  10/03/2022   Hypertension associated with diabetes 03/23/2022   Epigastric pain 03/16/2022   Irritable bowel syndrome with constipation 03/16/2022   Influenza B 09/16/2021   Acute cystitis without hematuria 09/16/2021   Closed nondisplaced fracture of metatarsal bone of right foot, initial encounter 09/16/2021   Acute metabolic encephalopathy 09/15/2021   Cryptogenic stroke 01/13/2021   GERD without esophagitis 11/26/2020   Iron deficiency 11/26/2020   Generalized abdominal pain 11/26/2020    Pneumonia due to COVID-19 virus    Mixed hyperlipidemia due to type 2 diabetes mellitus 11/27/2019   Family history of breast cancer in mother 11/27/2019   Cataract of both eyes 11/27/2019   Coronary artery disease involving native coronary artery of native heart without angina pectoris 02/18/2018   NSTEMI (non-ST elevated myocardial infarction)    Chest pain of uncertain etiology 10/16/2017   Need for shingles vaccine 09/21/2017   Need for pneumococcal vaccine 09/21/2017   History of rheumatic fever 06/30/2017   PFO (patent foramen ovale)    Cerebrovascular accident (CVA) due to embolism of right middle cerebral artery 03/14/2017   Dilation of biliary tract    Elevated LFTs    RUQ abdominal pain    Acute gallstone pancreatitis 12/07/2016   Choledocholithiasis with obstruction 12/07/2016   AKI (acute kidney injury) 12/07/2016   Osteoarthritis of right knee 08/23/2015   Status post total left knee replacement 08/23/2015   Arthritis of knee, right 04/20/2014   Status post total right knee replacement 04/20/2014   Flushing reaction 03/10/2012   Diabetes mellitus 02/17/2007   Mixed hyperlipidemia 02/17/2007   GOUT 02/17/2007   Severe obesity (BMI >= 40) (HCC) 02/17/2007   Essential hypertension 02/17/2007   RHEUMATIC FEVER, HX OF 02/17/2007    PCP: Melida Quitter, PA   REFERRING PROVIDER: Kathryne Hitch, MD   REFERRING DIAG: [phone] (ICD-10-CM) - History of bilateral knee replacement   THERAPY DIAG:  Chronic pain of left knee  Chronic pain of right knee  Muscle weakness (generalized)  Difficulty in walking, not elsewhere classified  Rationale for Evaluation and Treatment: Rehabilitation  ONSET DATE: a couple of months ago  SUBJECTIVE:   SUBJECTIVE STATEMENT: Pt arriving today reporting bilateral knee pain and states her left is worse than her right. Pt stating her stroke was several years ago. Pt presenting with notable atrophy on her left side compared to  her right.  Pt stating pain was so bad this past Saturday she went to ER.   PERTINENT HISTORY: arthritis, DM, gout, PFO, CVA, HTN, ERCP, TKA bil.   Dr. Eliberto Ivory assessment:  The patient has a remote history of bilateral knee replacements.  When I saw her last she had been recovering from a stroke and has significant left-sided weakness.  She is now unfortunately developed chronic pain in both her knees.  I have x-rayed both knees and did not see any worrisome features of the knee replacements.  She actually went to the emergency room recently in March and x-rays were obtained of both knees and again showed no evidence of effusion or complicating features.  There is no warmth in either knee and she denies any fever or chills.  She has not been to any type of physical therapy.  After her stroke she lost at least 70 pounds.     PAIN:  NPRS scale: 8/10 Pain location: bil [place], left worse than right Pain description: achy, thobbing, burning Aggravating factors: getting up and down, moving Relieving factors: lying on left side.  PRECAUTIONS: None  WEIGHT BEARING RESTRICTIONS: No  FALLS:  Has patient fallen in last 6 months? None reported  LIVING ENVIRONMENT: Lives with: lives with their family and lives with their spouse Lives in: House/apartment Stairs: Yes: External: 2 steps; none Has following equipment at home: Dan Humphreys - 2 wheeled  OCCUPATION: retired from [employer]  PLOF: Independent  PATIENT GOALS: get my leg better where is feels better  Next MD visit:   OBJECTIVE:   DIAGNOSTIC FINDINGS:  12/28/22:  Status post left total knee arthroplasty. No fracture or dislocation   PATIENT SURVEYS:  12/28/22: FOTO intake: 31   predicted:  53  COGNITION: Overall cognitive status: WFL    SENSATION: WFL  EDEMA:  12/28/22:  Circumferential: Left : 37 centimeters          Right: 38 centimeters MUSCLE LENGTH: Hamstrings: Right 60 deg; Left 50 deg   POSTURE: rounded  shoulders, forward head, and increased thoracic kyphosis  PALPATION: 12/28/22:  TTP: medial and lateral joint line, under patella  LOWER EXTREMITY ROM:   ROM Right 12/28/22 Active  supine Left 12/28/22 Active  supine  Hip flexion    Hip extension    Hip abduction    Hip adduction    Hip internal rotation    Hip external rotation    Knee flexion 115 110  Knee extension 0 0  Ankle dorsiflexion    Ankle plantarflexion    Ankle inversion    Ankle eversion     (Blank rows = not tested)  LOWER EXTREMITY MMT:  MMT Right 12/28/22 Left 12/28/22  Hip flexion 4 3  Hip extension    Hip abduction 4 3  Hip adduction 4 3  Hip internal rotation    Hip external rotation    Knee flexion 4 3+  Knee extension 4 3+  Ankle dorsiflexion    Ankle plantarflexion    Ankle inversion    Ankle eversion     (Blank rows = not tested)   FUNCTIONAL TESTS:  12/28/22: Sit to stand: 14 seconds c UE support with Stand by assist  GAIT: 12/28/22:  Distance walked: 5 feet  Assistive device utilized: NoneUE assist from chair and mat table with SBA Level of assistance: SBA Comments: small step length, increased knee flexion and hip flexion   TODAY'S TREATMENT                                                                          DATE:  12/28/22:  Therex:    HEP instruction/performance c cues for techniques, handout provided.  Trial set performed of each for comprehension and symptom assessment.  See below for exercise list  PATIENT EDUCATION:  Education details: HEP, POC Person educated: Patient Education method: Explanation, Demonstration, Verbal cues, and Handouts Education comprehension: verbalized understanding, returned demonstration, and verbal cues required  HOME EXERCISE PROGRAM: Access Code: [email or web address] URL: [email or web address] Date: 12/28/2022 Prepared by: Narda Amber  Exercises - Seated Long Arc Quad  - 2 x daily - 7 x weekly - 2 sets - 10 reps - 3 seconds  hold - Seated March  - 2 x daily - 7 x weekly - 2 sets - 10 reps - Seated Hip Adduction Isometrics with Newman Pies  -  2 x daily - 7 x weekly - 2 sets - 10 reps - 5 seconds hold  ASSESSMENT:  CLINICAL IMPRESSION: Patient is a 72 y.o. who comes to clinic with complaints of bilateral knee pain which is worse on the left. Pt presents with mobility, strength and movement coordination deficits that impair their ability to perform usual daily and recreational functional activities without increase difficulty/symptoms at this time.  Patient to benefit from skilled PT services to address impairments and limitations to improve to previous level of function without restriction secondary to condition.   OBJECTIVE IMPAIRMENTS: decreased activity tolerance, decreased balance, decreased mobility, difficulty walking, decreased ROM, decreased strength, impaired flexibility, obesity, and pain.   ACTIVITY LIMITATIONS: sitting, standing, and transfers  PARTICIPATION LIMITATIONS: meal prep, cleaning, laundry, and community activity  PERSONAL FACTORS: 3+ comorbidities: see pertinent history  are also affecting patient's functional outcome.   REHAB POTENTIAL: Fair due to multiple co-morbidities  CLINICAL DECISION MAKING: Evolving/moderate complexity  EVALUATION COMPLEXITY: Low   GOALS: Goals reviewed with patient? Yes  SHORT TERM GOALS: (target date for Short term goals are 3 weeks 01/22/23)   1.  Patient will demonstrate independent use of home exercise program to maintain progress from in clinic treatments.  Goal status: New  LONG TERM GOALS: (target dates for all long term goals are 10 weeks  03/12/23 )   1. Patient will demonstrate/report pain at worst less than or equal to 3/10 to facilitate minimal limitation in daily activity secondary to pain symptoms.  Goal status: New   2. Patient will demonstrate independent use of home exercise program to facilitate ability to maintain/progress functional gains  from skilled physical therapy services.  Goal status: New   3. Patient will demonstrate FOTO outcome > or = 51 % to indicate reduced disability due to condition.  Goal status: New   4.  Patient will demonstrate left LE MMT >/= 4/5 throughout to faciltiate usual transfers and household gait for daily life.   Goal status: New   5.  Patient will demonstrate sit to stand c UE support </= 10 seconds Goal status: New  6. Pt will be able to amb with rolling walker 500 feet on level surfaces with no rest breaks.   Goal Status: New        PLAN:  PT FREQUENCY: 1-2x/week  PT DURATION: 10 weeks  PLANNED INTERVENTIONS: Therapeutic exercises, Therapeutic activity, Neuro Muscular re-education, Balance training, Gait training, Patient/Family education, Joint mobilization, Stair training, DME instructions, Dry Needling, Electrical stimulation, Traction, Cryotherapy, vasopneumatic deviceMoist heat, Taping, Ultrasound, Ionotophoresis 4mg /ml Dexamethasone, and aquatic therapy, Manual therapy.  All included unless contraindicated  PLAN FOR NEXT SESSION: Review HEP knowledge/results. Knee ROM,  LE strengthening         Sharmon Leyden, PT, MPT 12/28/2022, 3:38 PM

## 2023-01-04 ENCOUNTER — Other Ambulatory Visit: Payer: Self-pay | Admitting: Cardiology

## 2023-01-04 DIAGNOSIS — E1169 Type 2 diabetes mellitus with other specified complication: Secondary | ICD-10-CM

## 2023-01-04 NOTE — Telephone Encounter (Signed)
Rx request sent to pharmacy.  

## 2023-01-06 ENCOUNTER — Encounter: Payer: Self-pay | Admitting: Rehabilitative and Restorative Service Providers"

## 2023-01-06 ENCOUNTER — Ambulatory Visit (INDEPENDENT_AMBULATORY_CARE_PROVIDER_SITE_OTHER): Payer: BC Managed Care – PPO | Admitting: Rehabilitative and Restorative Service Providers"

## 2023-01-06 DIAGNOSIS — M6281 Muscle weakness (generalized): Secondary | ICD-10-CM | POA: Diagnosis not present

## 2023-01-06 DIAGNOSIS — R262 Difficulty in walking, not elsewhere classified: Secondary | ICD-10-CM | POA: Diagnosis not present

## 2023-01-06 DIAGNOSIS — M25561 Pain in right knee: Secondary | ICD-10-CM | POA: Diagnosis not present

## 2023-01-06 DIAGNOSIS — G8929 Other chronic pain: Secondary | ICD-10-CM

## 2023-01-06 DIAGNOSIS — M25562 Pain in left knee: Secondary | ICD-10-CM | POA: Diagnosis not present

## 2023-01-06 NOTE — Therapy (Addendum)
OUTPATIENT PHYSICAL THERAPY LOWER EXTREMITY TREATMENT  /DISCHARGE   Patient Name: Rachel Black MRN: [id number] DOB:May 01, 1951, 72 y.o., female Today's Date: 01/06/2023  END OF SESSION:  PT End of Session - 01/06/23 1513     Visit Number 2    Number of Visits 20    Date for PT Re-Evaluation 03/12/23    PT Start Time 1514    PT Stop Time 1600    PT Time Calculation (min) 46 min    Activity Tolerance Patient tolerated treatment well;Patient limited by pain;No increased pain    Behavior During Therapy Anxious;WFL for tasks assessed/performed              Past Medical History:  Diagnosis Date   Arthritis    PAIN AND OA BOTH KNEES AND SHOULDERS AND ELBOWS AND WRIST   Blood transfusion without reported diagnosis 2018   after cholecystectomy   Broken foot, right, closed, initial encounter 09/15/2021   no surgery needed, fell at home and went to ED   Diabetes mellitus    ORAL MEDICATION   GERD (gastroesophageal reflux disease)    Gout    NO RECENT FLARE UPS   H/O: rheumatic fever    AS A CHILD - NO KNOWN HEART MURMUR OR HEART PROBLEMS   Heart attack    Hyperlipidemia    Hypertension    PFO (patent foramen ovale)    Stroke 03/2017   Past Surgical History:  Procedure Laterality Date   ABDOMINAL HYSTERECTOMY     CESAREAN SECTION     CHOLECYSTECTOMY N/A 12/09/2016   Procedure: LAPAROSCOPIC CHOLECYSTECTOMY WITH INTRAOPERATIVE CHOLANGIOGRAM;  Surgeon: Almond Lint, MD;  Location: [facility];  Service: General;  Laterality: N/A;   COLONOSCOPY     COLONOSCOPY WITH PROPOFOL N/A 01/30/2013   Procedure: COLONOSCOPY WITH PROPOFOL;  Surgeon: Hilarie Fredrickson, MD;  Location: [facility];  Service: Endoscopy;  Laterality: N/A;   ERCP N/A 12/08/2016   Procedure: ENDOSCOPIC RETROGRADE CHOLANGIOPANCREATOGRAPHY (ERCP);  Surgeon: Meryl Dare, MD;  Location: [facility];  Service: Endoscopy;  Laterality: N/A;   ESOPHAGOGASTRODUODENOSCOPY (EGD) WITH PROPOFOL N/A 01/30/2013   Procedure:  ESOPHAGOGASTRODUODENOSCOPY (EGD) WITH PROPOFOL;  Surgeon: Hilarie Fredrickson, MD;  Location: [facility];  Service: Endoscopy;  Laterality: N/A;   LEFT HEART CATH AND CORONARY ANGIOGRAPHY N/A 10/18/2017   Procedure: LEFT HEART CATH AND CORONARY ANGIOGRAPHY;  Surgeon: [place]az[place], Peter M, MD;  Location: [facility] IN[facility]ASIVE CV LAB;  Service: Cardiovascular;  Laterality: N/A;   LOOP RECORDER INSERTION N/A 03/18/2017   Procedure: Loop Recorder Insertion;  Surgeon: Regan Lemming, MD;  Location: [facility] INVAS[facility] CV LAB;  Service: Cardiovascular;  Laterality: N/A;   PARTIAL HYSTERECTOMY     TEE WITHOUT CARDIOVERSION N/A 03/16/2017   Procedure: TRANSESOPHAGEAL ECHOCARDIOGRAM (TEE);  Surgeon: Elease Hashimoto Deloris Ping, MD;  Location: [facility];  Service: Cardiovascular;  Laterality: N/A;   TOTAL KNEE ARTHROPLASTY Right 04/20/2014   Procedure: RIGHT TOTAL KNEE ARTHROPLASTY CONVERTED TO RIGHT KNEE REIMPLANTATION;  Surgeon: Kathryne Hitch, MD;  Location: [facility];  Service: Orthopedics;  Laterality: Right;   TOTAL KNEE ARTHROPLASTY Left 08/23/2015   Procedure: LEFT TOTAL KNEE ARTHROPLASTY;  Surgeon: Kathryne Hitch, MD;  Location: [facility];  Service: Orthopedics;  Laterality: Left;   VENTRAL HERNIA REPAIR     x2   Patient Active Problem List   Diagnosis Date Noted   Atopic dermatitis 11/01/2022   Inflammatory polyarthropathy 11/01/2022   Palpitations 10/25/2022   Left hip pain 10/04/2022   Bilateral knee pain  10/04/2022   Acute diverticulitis 10/03/2022   Hypertension associated with diabetes 03/23/2022   Epigastric pain 03/16/2022   Irritable bowel syndrome with constipation 03/16/2022   Influenza B 09/16/2021   Acute cystitis without hematuria 09/16/2021   Closed nondisplaced fracture of metatarsal bone of right foot, initial encounter 09/16/2021   Acute metabolic encephalopathy 09/15/2021   Cryptogenic stroke 01/13/2021   GERD without esophagitis 11/26/2020   Iron deficiency 11/26/2020   Generalized  abdominal pain 11/26/2020   Pneumonia due to COVID-19 virus    Mixed hyperlipidemia due to type 2 diabetes mellitus 11/27/2019   Family history of breast cancer in mother 11/27/2019   Cataract of both eyes 11/27/2019   Coronary artery disease involving native coronary artery of native heart without angina pectoris 02/18/2018   NSTEMI (non-ST elevated myocardial infarction)    Chest pain of uncertain etiology 10/16/2017   Need for shingles vaccine 09/21/2017   Need for pneumococcal vaccine 09/21/2017   History of rheumatic fever 06/30/2017   PFO (patent foramen ovale)    Cerebrovascular accident (CVA) due to embolism of right middle cerebral artery 03/14/2017   Dilation of biliary tract    Elevated LFTs    RUQ abdominal pain    Acute gallstone pancreatitis 12/07/2016   Choledocholithiasis with obstruction 12/07/2016   AKI (acute kidney injury) 12/07/2016   Osteoarthritis of right knee 08/23/2015   Status post total left knee replacement 08/23/2015   Arthritis of knee, right 04/20/2014   Status post total right knee replacement 04/20/2014   Flushing reaction 03/10/2012   Diabetes mellitus 02/17/2007   Mixed hyperlipidemia 02/17/2007   GOUT 02/17/2007   Severe obesity (BMI >= 40) (HCC) 02/17/2007   Essential hypertension 02/17/2007   RHEUMATIC FEVER, HX OF 02/17/2007    PCP: Melida Quitter, PA   REFERRING PROVIDER: Kathryne Hitch, MD   REFERRING DIAG: [phone] (ICD-10-CM) - History of bilateral knee replacement   THERAPY DIAG:  Chronic pain of left knee  Chronic pain of right knee  Muscle weakness (generalized)  Difficulty in walking, not elsewhere classified  Rationale for Evaluation and Treatment: Rehabilitation  ONSET DATE: a couple of months ago  SUBJECTIVE:   SUBJECTIVE STATEMENT: Rachel Black reports her left side occasionally "gives way" and she is concerned she may fall if it does again.  She sits most of the day and uses a walker to ambulate.  She is  taking tylenol for pain.  PERTINENT HISTORY: arthritis, DM, gout, PFO, CVA, HTN, ERCP, TKA bil.   Dr. Eliberto Ivory assessment:  The patient has a remote history of bilateral knee replacements.  When I saw her last she had been recovering from a stroke and has significant left-sided weakness.  She is now unfortunately developed chronic pain in both her knees.  I have x-rayed both knees and did not see any worrisome features of the knee replacements.  She actually went to the emergency room recently in March and x-rays were obtained of both knees and again showed no evidence of effusion or complicating features.  There is no warmth in either knee and she denies any fever or chills.  She has not been to any type of physical therapy.  After her stroke she lost at least 70 pounds.     PAIN:  NPRS scale: 8/10 Pain location: bil [place], left worse than right Pain description: achy, thobbing, burning Aggravating factors: getting up and down, moving Relieving factors: lying on left side.   PRECAUTIONS: None  WEIGHT BEARING RESTRICTIONS: No  FALLS:  Has patient fallen in last 6 months? None reported  LIVING ENVIRONMENT: Lives with: lives with their family and lives with their spouse Lives in: House/apartment Stairs: Yes: External: 2 steps; none Has following equipment at home: Dan Humphreys - 2 wheeled  OCCUPATION: retired from [employer]  PLOF: Independent  PATIENT GOALS: get my leg better where is feels better  Next MD visit: Late May   OBJECTIVE:   DIAGNOSTIC FINDINGS:  12/28/22:  Status post left total knee arthroplasty. No fracture or dislocation   PATIENT SURVEYS:  12/28/22: FOTO intake: 31   predicted:  53  COGNITION: Overall cognitive status: WFL    SENSATION: WFL  EDEMA:  12/28/22:  Circumferential: Left : 37 centimeters          Right: 38 centimeters MUSCLE LENGTH: Hamstrings: Right 60 deg; Left 50 deg   POSTURE: rounded shoulders, forward head, and increased  thoracic kyphosis  PALPATION: 12/28/22:  TTP: medial and lateral joint line, under patella  LOWER EXTREMITY ROM:   ROM Right 12/28/22 Active  supine Left 12/28/22 Active  supine  Hip flexion    Hip extension    Hip abduction    Hip adduction    Hip internal rotation    Hip external rotation    Knee flexion 115 110  Knee extension 0 0  Ankle dorsiflexion    Ankle plantarflexion    Ankle inversion    Ankle eversion     (Blank rows = not tested)  LOWER EXTREMITY MMT:  MMT Right 12/28/22 Left 12/28/22  Hip flexion 4 3  Hip extension    Hip abduction 4 3  Hip adduction 4 3  Hip internal rotation    Hip external rotation    Knee flexion 4 3+  Knee extension 4 3+  Ankle dorsiflexion    Ankle plantarflexion    Ankle inversion    Ankle eversion     (Blank rows = not tested)   FUNCTIONAL TESTS:  12/28/22: Sit to stand: 14 seconds c UE support with Stand by assist  GAIT: 12/28/22:  Distance walked: 5 feet  Assistive device utilized: NoneUE assist from chair and mat table with SBA Level of assistance: SBA Comments: small step length, increased knee flexion and hip flexion   TODAY'S TREATMENT                                                                          DATE: 01/06/2023 NuStep Level 3 for 5 minutes Quadriceps sets with pillow under knees 10X 5 seconds Seated knee extensions 10X 3 seconds bilateral Seated marching 10X 3 seconds Seated hip adductor isometrics 10X 5 seconds  Functional Activities:  Double Leg Press 62# 10X slow eccentrics Single leg left 12# and right leg 25# 10X each slow eccentrics Sit to stand from hi/low table (8 inches higher than a chair) 4X slow eccentrics, hands PRN   12/28/22:  Therex:    HEP instruction/performance c cues for techniques, handout provided.  Trial set performed of each for comprehension and symptom assessment.  See below for exercise list  PATIENT EDUCATION:  Education details: HEP, POC Person educated:  Patient Education method: Explanation, Demonstration, Verbal cues, and Handouts Education comprehension: verbalized understanding, returned demonstration, and  verbal cues required  HOME EXERCISE PROGRAM: Access Code: [email or web address] URL: [email or web address] Date: 12/28/2022 Prepared by: Narda Amber  Exercises - Seated Long Arc Quad  - 2 x daily - 7 x weekly - 2 sets - 10 reps - 3 seconds hold - Seated March  - 2 x daily - 7 x weekly - 2 sets - 10 reps - Seated Hip Adduction Isometrics with Ball  - 2 x daily - 7 x weekly - 2 sets - 10 reps - 5 seconds hold  ASSESSMENT:  CLINICAL IMPRESSION: Rachel Black notes bilateral knee pain which is worse on the left. Rachel Black had a previous stroke affecting the left side and atrophy of the left lower extremity is noted.  She did well with today's progressions and review of her home exercise program, although she did require rest and some time to get resistance appropriate.  She will benefit from progressive strengthening, particularly of the quadriceps and hip abductors to meet long-term goals.  OBJECTIVE IMPAIRMENTS: decreased activity tolerance, decreased balance, decreased mobility, difficulty walking, decreased ROM, decreased strength, impaired flexibility, obesity, and pain.   ACTIVITY LIMITATIONS: sitting, standing, and transfers  PARTICIPATION LIMITATIONS: meal prep, cleaning, laundry, and community activity  PERSONAL FACTORS: 3+ comorbidities: see pertinent history  are also affecting patient's functional outcome.   REHAB POTENTIAL: Fair due to multiple co-morbidities  CLINICAL DECISION MAKING: Evolving/moderate complexity  EVALUATION COMPLEXITY: Low   GOALS: Goals reviewed with patient? Yes  SHORT TERM GOALS: (target date for Short term goals are 3 weeks 01/22/23)   1.  Patient will demonstrate independent use of home exercise program to maintain progress from in clinic treatments.  Goal status: On Going 01/06/2023  LONG TERM  GOALS: (target dates for all long term goals are 10 weeks  03/12/23 )   1. Patient will demonstrate/report pain at worst less than or equal to 3/10 to facilitate minimal limitation in daily activity secondary to pain symptoms.  Goal status: New   2. Patient will demonstrate independent use of home exercise program to facilitate ability to maintain/progress functional gains from skilled physical therapy services.  Goal status: New   3. Patient will demonstrate FOTO outcome > or = 51 % to indicate reduced disability due to condition.  Goal status: New   4.  Patient will demonstrate left LE MMT >/= 4/5 throughout to faciltiate usual transfers and household gait for daily life.   Goal status: New   5.  Patient will demonstrate sit to stand c UE support </= 10 seconds Goal status: New  6. Pt will be able to amb with rolling walker 500 feet on level surfaces with no rest breaks.   Goal Status: New        PLAN:  PT FREQUENCY: 1-2x/week  PT DURATION: 10 weeks  PLANNED INTERVENTIONS: Therapeutic exercises, Therapeutic activity, Neuro Muscular re-education, Balance training, Gait training, Patient/Family education, Joint mobilization, Stair training, DME instructions, Dry Needling, Electrical stimulation, Traction, Cryotherapy, vasopneumatic deviceMoist heat, Taping, Ultrasound, Ionotophoresis 4mg /ml Dexamethasone, and aquatic therapy, Manual therapy.  All included unless contraindicated  PLAN FOR NEXT SESSION: Review HEP knowledge/results.  Emphasis on standing endurance, quadricep strength, hip abductor strength while avoiding flaring up bilateral knee pain as her endurance is very limited at this time.     Cherlyn Cushing, PT, MPT 01/06/2023, 5:15 PM   PHYSICAL THERAPY DISCHARGE SUMMARY  Visits from Start of Care: 2  Current functional level related to goals / functional outcomes: See note   Remaining deficits: See note  Education / Equipment: HEP  Patient goals were  partially met. Patient is being discharged due to not returning since the last visit.  Rachel Black, PT, DPT, OCS, ATC 03/10/23  3:41 PM

## 2023-01-08 ENCOUNTER — Encounter: Payer: BC Managed Care – PPO | Admitting: Rehabilitative and Restorative Service Providers"

## 2023-01-09 ENCOUNTER — Other Ambulatory Visit: Payer: Self-pay | Admitting: Nurse Practitioner

## 2023-01-09 DIAGNOSIS — E1159 Type 2 diabetes mellitus with other circulatory complications: Secondary | ICD-10-CM

## 2023-01-11 ENCOUNTER — Encounter: Payer: BC Managed Care – PPO | Admitting: Physical Therapy

## 2023-01-13 ENCOUNTER — Encounter: Payer: BC Managed Care – PPO | Admitting: Rehabilitative and Restorative Service Providers"

## 2023-01-13 ENCOUNTER — Other Ambulatory Visit: Payer: Self-pay

## 2023-01-13 ENCOUNTER — Emergency Department (HOSPITAL_COMMUNITY): Payer: BC Managed Care – PPO

## 2023-01-13 ENCOUNTER — Observation Stay (HOSPITAL_COMMUNITY)
Admission: EM | Admit: 2023-01-13 | Discharge: 2023-01-15 | Disposition: A | Discharge status: Home / Self Care | Payer: BC Managed Care – PPO | Attending: Family Medicine | Admitting: Family Medicine

## 2023-01-13 ENCOUNTER — Encounter (HOSPITAL_COMMUNITY): Payer: Self-pay | Admitting: *Deleted

## 2023-01-13 DIAGNOSIS — Z7982 Long term (current) use of aspirin: Secondary | ICD-10-CM | POA: Insufficient documentation

## 2023-01-13 DIAGNOSIS — Z8673 Personal history of transient ischemic attack (TIA), and cerebral infarction without residual deficits: Secondary | ICD-10-CM

## 2023-01-13 DIAGNOSIS — E119 Type 2 diabetes mellitus without complications: Secondary | ICD-10-CM | POA: Diagnosis not present

## 2023-01-13 DIAGNOSIS — Q2112 Patent foramen ovale: Secondary | ICD-10-CM

## 2023-01-13 DIAGNOSIS — Z96653 Presence of artificial knee joint, bilateral: Secondary | ICD-10-CM | POA: Insufficient documentation

## 2023-01-13 DIAGNOSIS — I10 Essential (primary) hypertension: Secondary | ICD-10-CM | POA: Diagnosis not present

## 2023-01-13 DIAGNOSIS — E782 Mixed hyperlipidemia: Secondary | ICD-10-CM | POA: Diagnosis present

## 2023-01-13 DIAGNOSIS — Z955 Presence of coronary angioplasty implant and graft: Secondary | ICD-10-CM | POA: Diagnosis not present

## 2023-01-13 DIAGNOSIS — R109 Unspecified abdominal pain: Secondary | ICD-10-CM | POA: Diagnosis present

## 2023-01-13 DIAGNOSIS — I2693 Single subsegmental pulmonary embolism without acute cor pulmonale: Secondary | ICD-10-CM | POA: Diagnosis not present

## 2023-01-13 DIAGNOSIS — I2699 Other pulmonary embolism without acute cor pulmonale: Secondary | ICD-10-CM

## 2023-01-13 DIAGNOSIS — M109 Gout, unspecified: Secondary | ICD-10-CM | POA: Diagnosis present

## 2023-01-13 DIAGNOSIS — R197 Diarrhea, unspecified: Secondary | ICD-10-CM | POA: Diagnosis not present

## 2023-01-13 DIAGNOSIS — K529 Noninfective gastroenteritis and colitis, unspecified: Secondary | ICD-10-CM

## 2023-01-13 DIAGNOSIS — Z79899 Other long term (current) drug therapy: Secondary | ICD-10-CM | POA: Insufficient documentation

## 2023-01-13 DIAGNOSIS — I2510 Atherosclerotic heart disease of native coronary artery without angina pectoris: Secondary | ICD-10-CM | POA: Diagnosis not present

## 2023-01-13 DIAGNOSIS — E1159 Type 2 diabetes mellitus with other circulatory complications: Secondary | ICD-10-CM

## 2023-01-13 DIAGNOSIS — I152 Hypertension secondary to endocrine disorders: Secondary | ICD-10-CM

## 2023-01-13 DIAGNOSIS — E1169 Type 2 diabetes mellitus with other specified complication: Secondary | ICD-10-CM | POA: Diagnosis present

## 2023-01-13 LAB — URINALYSIS, ROUTINE W REFLEX MICROSCOPIC
Bilirubin Urine: NEGATIVE
Glucose, UA: NEGATIVE mg/dL
Hgb urine dipstick: NEGATIVE
Ketones, ur: NEGATIVE mg/dL
Nitrite: POSITIVE — AB
Protein, ur: NEGATIVE mg/dL
Specific Gravity, Urine: 1.025 (ref 1.005–1.030)
pH: 5 (ref 5.0–8.0)

## 2023-01-13 LAB — CBC
HCT: 35.2 % — ABNORMAL LOW (ref 36.0–46.0)
Hemoglobin: 11.5 g/dL — ABNORMAL LOW (ref 12.0–15.0)
MCH: 32.4 pg (ref 26.0–34.0)
MCHC: 32.7 g/dL (ref 30.0–36.0)
MCV: 99.2 fL (ref 80.0–100.0)
Platelets: 201 10*3/uL (ref 150–400)
RBC: 3.55 MIL/uL — ABNORMAL LOW (ref 3.87–5.11)
RDW: 14 % (ref 11.5–15.5)
WBC: 7.2 10*3/uL (ref 4.0–10.5)
nRBC: 0 % (ref 0.0–0.2)

## 2023-01-13 LAB — COMPREHENSIVE METABOLIC PANEL
ALT: 15 U/L (ref 0–44)
AST: 19 U/L (ref 15–41)
Albumin: 3.6 g/dL (ref 3.5–5.0)
Alkaline Phosphatase: 38 U/L (ref 38–126)
Anion gap: 8 (ref 5–15)
BUN: 19 mg/dL (ref 8–23)
CO2: 23 mmol/L (ref 22–32)
Calcium: 9.2 mg/dL (ref 8.9–10.3)
Chloride: 108 mmol/L (ref 98–111)
Creatinine, Ser: 0.94 mg/dL (ref 0.44–1.00)
GFR, Estimated: >60 mL/min (ref >60)
Glucose, Bld: 103 mg/dL — ABNORMAL HIGH (ref 70–99)
Potassium: 3.8 mmol/L (ref 3.5–5.1)
Sodium: 139 mmol/L (ref 135–145)
Total Bilirubin: 0.5 mg/dL (ref 0.3–1.2)
Total Protein: 5.8 g/dL — ABNORMAL LOW (ref 6.5–8.1)

## 2023-01-13 LAB — LIPASE, BLOOD: Lipase: 44 U/L (ref 11–51)

## 2023-01-13 MED ORDER — MORPHINE SULFATE (PF) 4 MG/ML IV SOLN
4.0000 mg | Freq: Once | INTRAVENOUS | Status: AC
  Administered 2023-01-13: 4 mg via INTRAVENOUS
  Filled 2023-01-13: qty 1

## 2023-01-13 MED ORDER — IOHEXOL 350 MG/ML SOLN
75.0000 mL | Freq: Once | INTRAVENOUS | Status: AC | PRN
  Administered 2023-01-13: 75 mL via INTRAVENOUS

## 2023-01-13 MED ORDER — LACTATED RINGERS IV BOLUS
1000.0000 mL | Freq: Once | INTRAVENOUS | Status: AC
  Administered 2023-01-13: 1000 mL via INTRAVENOUS

## 2023-01-13 MED ORDER — SODIUM CHLORIDE 0.9 % IV SOLN
1.0000 g | Freq: Once | INTRAVENOUS | Status: AC
  Administered 2023-01-13: 1 g via INTRAVENOUS
  Filled 2023-01-13: qty 10

## 2023-01-13 NOTE — ED Triage Notes (Signed)
The pt is c/o abd pain with diarrhea for 2 weeks or so

## 2023-01-13 NOTE — ED Provider Notes (Signed)
[place] EMERGENCY DEPARTMENT AT [facility] Provider Note   CSN: [id number] Arrival date & time: 01/13/23  1703     History  Chief Complaint  Patient presents with   Abdominal Pain    Rachel Black is a 72 y.o. female.  With PMH of HTN, HLD, gout, DM presenting with abdominal pain and diarrhea over the past 2 weeks.  Patient says she has been going to the bathroom having multiple bowel movements daily and notes that they are much darker and blacker than usual.  She is not on any anticoagulation or antiplatelets.  She is mainly complaining of epigastrium pain and lower quadrant and the left-sided pain.  She has had previous diverticulitis before.  She has had nausea but no vomiting.  No fevers or chills at home.  Has been urinating often but no burning or pain.  No chest pain, no shortness of breath.   Abdominal Pain      Home Medications Prior to Admission medications   Medication Sig Start Date End Date Taking? Authorizing Provider  ACCU-CHEK AVIVA PLUS test strip USE AS INSTRUCTED ONCE A DAY. DX CODE E11.9 07/20/22   Mayer Masker, PA-C  Accu-Chek Softclix Lancets lancets Use as instructed once a day.  DX Code: E11.9 03/24/22   Mayer Masker, PA-C  acetaminophen-codeine (TYLENOL #3) 300-30 MG tablet Take 1 tablet by mouth every 4 (four) hours as needed for moderate pain. 12/03/22   Kirtland Bouchard, PA-C  allopurinol (ZYLOPRIM) 300 MG tablet TAKE 1 TABLET BY MOUTH TWICE A DAY 08/27/22   Carlean Jews, NP  aspirin 81 MG EC tablet Take 1 tablet (81 mg total) by mouth daily. 10/18/17   Laverda Page B, NP  CALCIUM PO Take 2 tablets by mouth daily.    [provider]  Emollient (UDDERLY SMOOTH) CREA Apply 1 application topically See admin instructions. Apply topically to skin folds as needed for rash/irritation    [provider]  famotidine (PEPCID) 10 MG tablet Take 10 mg by mouth daily as needed for heartburn or indigestion.    [provider]  fenofibrate 160 MG tablet Take 1 tablet (160 mg total) by mouth daily. 09/10/22   Carlean Jews, NP  ferrous sulfate 325 (65 FE) MG tablet TAKE 1 TABLET BY MOUTH TWICE A DAY Patient taking differently: Take 325 mg by mouth daily. 08/03/22   Zehr, Princella Pellegrini, PA-C  gabapentin (NEURONTIN) 300 MG capsule Take 1 capsule (300 mg total) by mouth at bedtime. 12/21/22   Kathryne Hitch, MD  isosorbide mononitrate (IMDUR) 60 MG 24 hr tablet Take 1 tablet (60 mg total) by mouth daily. Patient taking differently: Take 30 mg by mouth daily. 03/09/22   Rollene Rotunda, MD  LINZESS 72 MCG capsule TAKE 1 CAPSULE BY MOUTH DAILY BEFORE BREAKFAST. 12/15/22   Saralyn Pilar A, PA  olmesartan (BENICAR) 5 MG tablet Take 1 tablet (5 mg total) by mouth daily. 10/23/22   Carlean Jews, NP  rosuvastatin (CRESTOR) 40 MG tablet TAKE 1 TABLET BY MOUTH EVERY DAY 01/04/23   Rollene Rotunda, MD  triamcinolone (KENALOG) 0.025 % cream Apply 1 Application topically 2 (two) times daily. 09/28/22   Carlean Jews, NP      Allergies    Other and Penicillins    Review of Systems   Review of Systems  Gastrointestinal:  Positive for abdominal pain.    Physical Exam Updated Vital Signs BP 123/62   Pulse 71  Temp 98.6 F (37 C) (Oral)   Resp 16   Ht 5\' 6"  (1.676 m)   Wt 80.3 kg   SpO2 100%   BMI 28.57 kg/m  Physical Exam Constitutional: Alert and oriented.  Acting appropriately no acute distress Eyes: Conjunctivae are normal. ENT      Head: Normocephalic and atraumatic. Cardiovascular: S1, S2,  Normal and symmetric distal pulses are present in all extremities.Warm and well perfused. Respiratory: Normal respiratory effort. Breath sounds are normal.  O2 sat 100 on RA Gastrointestinal: Soft and nondistended with epigastrium and left lower quadrant tenderness.  No rebound or guarding.  Rectal exam with small volume but dark black stool, significant erythema and irritation around the rectal  region Musculoskeletal: Normal range of motion in all extremities. Neurologic: Normal speech and language. No gross focal neurologic deficits are appreciated. Skin: Skin is warm, dry and intact. No rash noted. Psychiatric: Mood and affect are normal. Speech and behavior are normal.  ED Results / Procedures / Treatments   Labs (all labs ordered are listed, but only abnormal results are displayed) Labs Reviewed  COMPREHENSIVE METABOLIC PANEL - Abnormal; Notable for the following components:      Result Value   Glucose, Bld 103 (*)    Total Protein 5.8 (*)    All other components within normal limits  CBC - Abnormal; Notable for the following components:   RBC 3.55 (*)    Hemoglobin 11.5 (*)    HCT 35.2 (*)    All other components within normal limits  URINALYSIS, ROUTINE W REFLEX MICROSCOPIC - Abnormal; Notable for the following components:   APPearance HAZY (*)    Nitrite POSITIVE (*)    Leukocytes,Ua TRACE (*)    Bacteria, UA FEW (*)    All other components within normal limits  GASTROINTESTINAL PANEL BY PCR, STOOL (REPLACES STOOL CULTURE)  LIPASE, BLOOD    EKG None  Radiology No results found.  Procedures Procedures    Medications Ordered in ED Medications  lactated ringers bolus 1,000 mL (has no administration in time range)  cefTRIAXone (ROCEPHIN) 1 g in sodium chloride 0.9 % 100 mL IVPB (has no administration in time range)  morphine (PF) 4 MG/ML injection 4 mg (has no administration in time range)    ED Course/ Medical Decision Making/ A&P                             Medical Decision Making Amount and/or Complexity of Data Reviewed Radiology: ordered.  Risk Prescription drug management.    Final Clinical Impression(s) / ED Diagnoses Final diagnoses:  None    Rx / DC Orders ED Discharge Orders     None

## 2023-01-14 ENCOUNTER — Telehealth (HOSPITAL_COMMUNITY): Payer: Self-pay | Admitting: Pharmacy Technician

## 2023-01-14 ENCOUNTER — Other Ambulatory Visit (HOSPITAL_COMMUNITY): Payer: Self-pay

## 2023-01-14 ENCOUNTER — Observation Stay (HOSPITAL_BASED_OUTPATIENT_CLINIC_OR_DEPARTMENT_OTHER): Payer: BC Managed Care – PPO

## 2023-01-14 DIAGNOSIS — I2510 Atherosclerotic heart disease of native coronary artery without angina pectoris: Secondary | ICD-10-CM

## 2023-01-14 DIAGNOSIS — Z86711 Personal history of pulmonary embolism: Secondary | ICD-10-CM

## 2023-01-14 DIAGNOSIS — I2693 Single subsegmental pulmonary embolism without acute cor pulmonale: Secondary | ICD-10-CM

## 2023-01-14 DIAGNOSIS — A09 Infectious gastroenteritis and colitis, unspecified: Secondary | ICD-10-CM

## 2023-01-14 DIAGNOSIS — I2699 Other pulmonary embolism without acute cor pulmonale: Secondary | ICD-10-CM

## 2023-01-14 DIAGNOSIS — K529 Noninfective gastroenteritis and colitis, unspecified: Secondary | ICD-10-CM

## 2023-01-14 DIAGNOSIS — E782 Mixed hyperlipidemia: Secondary | ICD-10-CM | POA: Diagnosis not present

## 2023-01-14 DIAGNOSIS — I10 Essential (primary) hypertension: Secondary | ICD-10-CM

## 2023-01-14 DIAGNOSIS — Q2112 Patent foramen ovale: Secondary | ICD-10-CM | POA: Diagnosis not present

## 2023-01-14 DIAGNOSIS — R197 Diarrhea, unspecified: Secondary | ICD-10-CM | POA: Diagnosis not present

## 2023-01-14 DIAGNOSIS — Z8673 Personal history of transient ischemic attack (TIA), and cerebral infarction without residual deficits: Secondary | ICD-10-CM

## 2023-01-14 HISTORY — DX: Other pulmonary embolism without acute cor pulmonale: I26.99

## 2023-01-14 LAB — CBC WITH DIFFERENTIAL/PLATELET
Abs Immature Granulocytes: 0.03 10*3/uL (ref 0.00–0.07)
Basophils Absolute: 0 10*3/uL (ref 0.0–0.1)
Basophils Relative: 0 %
Eosinophils Absolute: 0 10*3/uL (ref 0.0–0.5)
Eosinophils Relative: 0 %
HCT: 33.8 % — ABNORMAL LOW (ref 36.0–46.0)
Hemoglobin: 11.6 g/dL — ABNORMAL LOW (ref 12.0–15.0)
Immature Granulocytes: 0 %
Lymphocytes Relative: 32 %
Lymphs Abs: 2.2 10*3/uL (ref 0.7–4.0)
MCH: 33.2 pg (ref 26.0–34.0)
MCHC: 34.3 g/dL (ref 30.0–36.0)
MCV: 96.8 fL (ref 80.0–100.0)
Monocytes Absolute: 0.4 10*3/uL (ref 0.1–1.0)
Monocytes Relative: 5 %
Neutro Abs: 4.2 10*3/uL (ref 1.7–7.7)
Neutrophils Relative %: 63 %
Platelets: 182 10*3/uL (ref 150–400)
RBC: 3.49 MIL/uL — ABNORMAL LOW (ref 3.87–5.11)
RDW: 14.1 % (ref 11.5–15.5)
WBC: 6.8 10*3/uL (ref 4.0–10.5)
nRBC: 0 % (ref 0.0–0.2)

## 2023-01-14 LAB — BASIC METABOLIC PANEL
Anion gap: 4 — ABNORMAL LOW (ref 5–15)
BUN: 12 mg/dL (ref 8–23)
CO2: 27 mmol/L (ref 22–32)
Calcium: 9.3 mg/dL (ref 8.9–10.3)
Chloride: 107 mmol/L (ref 98–111)
Creatinine, Ser: 0.86 mg/dL (ref 0.44–1.00)
GFR, Estimated: >60 mL/min (ref >60)
Glucose, Bld: 94 mg/dL (ref 70–99)
Potassium: 3.2 mmol/L — ABNORMAL LOW (ref 3.5–5.1)
Sodium: 138 mmol/L (ref 135–145)

## 2023-01-14 LAB — OCCULT BLOOD, POC DEVICE: Fecal Occult Bld: NEGATIVE

## 2023-01-14 MED ORDER — FERROUS SULFATE 325 (65 FE) MG PO TABS
325.0000 mg | ORAL_TABLET | Freq: Two times a day (BID) | ORAL | Status: DC
  Administered 2023-01-14 – 2023-01-15 (×3): 325 mg via ORAL
  Filled 2023-01-14 (×2): qty 1

## 2023-01-14 MED ORDER — ENOXAPARIN SODIUM 80 MG/0.8ML IJ SOSY
80.0000 mg | PREFILLED_SYRINGE | Freq: Two times a day (BID) | INTRAMUSCULAR | Status: DC
  Administered 2023-01-14: 80 mg via SUBCUTANEOUS
  Filled 2023-01-14: qty 0.8

## 2023-01-14 MED ORDER — LINACLOTIDE 145 MCG PO CAPS: 145.0000 ug | ORAL_CAPSULE | Freq: Every day | ORAL | Status: DC

## 2023-01-14 MED ORDER — APIXABAN 5 MG PO TABS
10.0000 mg | ORAL_TABLET | Freq: Two times a day (BID) | ORAL | Status: DC
  Administered 2023-01-14 – 2023-01-15 (×3): 10 mg via ORAL
  Filled 2023-01-14 (×3): qty 2

## 2023-01-14 MED ORDER — ONDANSETRON HCL 4 MG PO TABS: 4.0000 mg | ORAL_TABLET | Freq: Four times a day (QID) | ORAL | Status: DC | PRN

## 2023-01-14 MED ORDER — ROSUVASTATIN CALCIUM 20 MG PO TABS
40.0000 mg | ORAL_TABLET | Freq: Every day | ORAL | Status: DC
  Administered 2023-01-14 – 2023-01-15 (×2): 40 mg via ORAL
  Filled 2023-01-14: qty 2

## 2023-01-14 MED ORDER — ACETAMINOPHEN 650 MG RE SUPP: 650.0000 mg | Freq: Four times a day (QID) | RECTAL | Status: DC | PRN

## 2023-01-14 MED ORDER — PANTOPRAZOLE SODIUM 40 MG IV SOLR: 40.0000 mg | Freq: Once | INTRAVENOUS | Status: DC

## 2023-01-14 MED ORDER — METRONIDAZOLE 500 MG/100ML IV SOLN
500.0000 mg | Freq: Once | INTRAVENOUS | Status: AC
  Administered 2023-01-14: 500 mg via INTRAVENOUS
  Filled 2023-01-14: qty 100

## 2023-01-14 MED ORDER — APIXABAN 5 MG PO TABS: 5.0000 mg | ORAL_TABLET | Freq: Two times a day (BID) | ORAL | Status: DC

## 2023-01-14 MED ORDER — IRBESARTAN 75 MG PO TABS
37.5000 mg | ORAL_TABLET | Freq: Every day | ORAL | Status: DC
  Administered 2023-01-14 – 2023-01-15 (×2): 37.5 mg via ORAL
  Filled 2023-01-14: qty 1

## 2023-01-14 MED ORDER — FENOFIBRATE 160 MG PO TABS
160.0000 mg | ORAL_TABLET | Freq: Every day | ORAL | Status: DC
  Administered 2023-01-14 – 2023-01-15 (×2): 160 mg via ORAL
  Filled 2023-01-14 (×2): qty 1

## 2023-01-14 MED ORDER — ONDANSETRON HCL 4 MG/2ML IJ SOLN: 4.0000 mg | Freq: Four times a day (QID) | INTRAMUSCULAR | Status: DC | PRN

## 2023-01-14 MED ORDER — GABAPENTIN 300 MG PO CAPS
300.0000 mg | ORAL_CAPSULE | Freq: Every day | ORAL | Status: DC
  Administered 2023-01-14 (×2): 300 mg via ORAL
  Filled 2023-01-14 (×2): qty 1

## 2023-01-14 MED ORDER — ACETAMINOPHEN 325 MG PO TABS: 650.0000 mg | ORAL_TABLET | Freq: Four times a day (QID) | ORAL | Status: DC | PRN

## 2023-01-14 MED ORDER — FAMOTIDINE 10 MG PO TABS: 10.0000 mg | ORAL_TABLET | Freq: Every day | ORAL | Status: DC | PRN

## 2023-01-14 MED ORDER — POTASSIUM CHLORIDE IN NACL 20-0.9 MEQ/L-% IV SOLN
INTRAVENOUS | Status: DC
  Filled 2023-01-14 (×4): qty 1000

## 2023-01-14 MED ORDER — ASPIRIN 81 MG PO TBEC
81.0000 mg | DELAYED_RELEASE_TABLET | Freq: Every day | ORAL | Status: DC
  Administered 2023-01-14 – 2023-01-15 (×2): 81 mg via ORAL
  Filled 2023-01-14 (×2): qty 1

## 2023-01-14 MED ORDER — POTASSIUM CHLORIDE CRYS ER 20 MEQ PO TBCR
40.0000 meq | EXTENDED_RELEASE_TABLET | ORAL | Status: AC
  Administered 2023-01-14 (×2): 40 meq via ORAL
  Filled 2023-01-14 (×2): qty 2

## 2023-01-14 MED ORDER — ISOSORBIDE MONONITRATE ER 30 MG PO TB24
30.0000 mg | ORAL_TABLET | Freq: Every day | ORAL | Status: DC
  Administered 2023-01-14 – 2023-01-15 (×2): 30 mg via ORAL
  Filled 2023-01-14 (×2): qty 1

## 2023-01-14 MED ORDER — ALLOPURINOL 300 MG PO TABS
300.0000 mg | ORAL_TABLET | Freq: Two times a day (BID) | ORAL | Status: DC
  Administered 2023-01-14 – 2023-01-15 (×3): 300 mg via ORAL
  Filled 2023-01-14 (×3): qty 1

## 2023-01-14 MED ORDER — SODIUM CHLORIDE 0.9 % IV SOLN
2.0000 g | INTRAVENOUS | Status: DC
  Administered 2023-01-14: 2 g via INTRAVENOUS
  Filled 2023-01-14: qty 20

## 2023-01-14 NOTE — Progress Notes (Addendum)
PROGRESS NOTE    Rachel Black  ZOX:[id number] DOB: 12-03-1950 DOA: 01/13/2023 PCP: Melida Quitter, PA   Brief Narrative:  This is a 72 year old woman female with PMHx of gout, HLD, HTN, CAD, osteoarthritis, PFO, and h/o CVA.  On Monday, patient called her husband to say that she was having bad diarrhea and her stomach is bothering her.  She has not been eating much, she has had approximately 70 pound weight loss over the last year.  She denies nausea, vomiting, lightheadedness or dizziness.  She denies fevers or chills, or burning urination.  Per husband she has abdominal issues, be maintained on Linzess.  She was on antibiotics sometime within the last 3 months for UTI.  Patient's last colonoscopy was 10/03/2021, polyps only noted.  Last EGD 04/10/2022 showed this is a large-caliber ringlike structure and multiple gastric polyps. Her husband brought her in for ongoing diarrhea.   In the ER patient is occult stool negative.  CTA abdomen and pelvis shows a small RLL PE, mild colitis.  Labs essentially normal.  Hospitalist asked to admit.  Assessment & Plan:   Principal Problem:   Diarrhea Active Problems:   Mixed hyperlipidemia   GOUT   PFO (patent foramen ovale)   Colitis   Acute pulmonary embolism (HCC)   H/O: CVA (cerebrovascular accident)   CAD S/P percutaneous coronary angioplasty   HTN (hypertension), benign  Small right lower lobe acute PE: Asymptomatic.  Has been started on Lovenox.  Will transition to Eliquis tonight.  Discussed all the benefits and risk of anticoagulation including life-threatening bleeding.  They verbalized understanding and agreed with the plan.  Doppler lower extremity ordered and pending.  Diarrhea/acute colitis: CT abdomen shows acute colitis.  Patient has been started on Rocephin and Flagyl.  Feels better.  Last bowel movement 24 hours ago.  No abdominal pain or tenderness.  Waiting for stool C. difficile and GI pathogen panel.  Linzess on  hold.  Weight loss: Reports of greater than 70 pound weight loss over a year but some of them is intentional.  Essential hypertension: Controlled.  Continue home dose of Benicar and Imdur.  Mixed hyperlipidemia: Continue Crestor and fenofibrate.  Sinus bradycardia/obesity: Rates in 40s but patient asymptomatic.  I suspect sleep apnea.  Recommend outpatient sleep study.  Monitor closely.  She is not on any AV nodal blocking agents.  Hypokalemia: Replace.  DVT prophylaxis: Lovenox   Code Status: Full Code  Family Communication: Husband none present at bedside.  Plan of care discussed with patient in length and he/she verbalized understanding and agreed with it.  Status is: Observation The patient will require care spanning > 2 midnights and should be moved to inpatient because: Patient not comfortable going home.   Estimated body mass index is 28.57 kg/m as calculated from the following:   Height as of this encounter: 5\' 6"  (1.676 m).   Weight as of this encounter: 80.3 kg.    Nutritional Assessment: Body mass index is 28.57 kg/m.Marland Kitchen Seen by dietician.  I agree with the assessment and plan as outlined below: Nutrition Status:        . Skin Assessment: I have examined the patient's skin and I agree with the wound assessment as performed by the wound care RN as outlined below:    Consultants:  None  Procedures:  None  Antimicrobials:  Anti-infectives (From admission, onward)    Start     Dose/Rate Route Frequency Ordered Stop   01/14/23 1800  cefTRIAXone (ROCEPHIN)  2 g in sodium chloride 0.9 % 100 mL IVPB        2 g 200 mL/hr over 30 Minutes Intravenous Every 24 hours 01/14/23 0141     01/14/23 0015  metroNIDAZOLE (FLAGYL) IVPB 500 mg        500 mg 100 mL/hr over 60 Minutes Intravenous  Once 01/14/23 0000 01/14/23 0134   01/13/23 2115  cefTRIAXone (ROCEPHIN) 1 g in sodium chloride 0.9 % 100 mL IVPB        1 g 200 mL/hr over 30 Minutes Intravenous  Once 01/13/23  2110 01/13/23 2242         Subjective: Patient seen and examined.  She says that she is overall feeling better than yesterday but still does not feel comfortable going home yet.  Husband at the bedside.  Objective: Vitals:   01/13/23 2058 01/14/23 0109 01/14/23 0236 01/14/23 0843  BP: 123/62 (!) 151/51 (!) 126/55 (!) 114/37  Pulse: 71 (!) 54 (!) 45 (!) 50  Resp: 16 16 18 17   Temp: 98.6 F (37 C) 98.1 F (36.7 C) 98.3 F (36.8 C) 98.2 F (36.8 C)  TempSrc: Oral Oral Oral Oral  SpO2: 100% 96% 100% 99%  Weight:      Height:        Intake/Output Summary (Last 24 hours) at 01/14/2023 0930 Last data filed at 01/14/2023 0035 Gross per 24 hour  Intake 1000 ml  Output --  Net 1000 ml   Filed Weights   01/13/23 1726  Weight: 80.3 kg    Examination:  General exam: Appears calm and comfortable, obese Respiratory system: Clear to auscultation. Respiratory effort normal. Cardiovascular system: S1 & S2 heard, RRR. No JVD, murmurs, rubs, gallops or clicks. No pedal edema. Gastrointestinal system: Abdomen is nondistended, soft and nontender. No organomegaly or masses felt. Normal bowel sounds heard. Central nervous system: Alert and oriented. No focal neurological deficits. Extremities: Symmetric 5 x 5 power. Skin: No rashes, lesions or ulcers Psychiatry: Judgement and insight appear normal. Mood & affect appropriate.    Data Reviewed: I have personally reviewed following labs and imaging studies  CBC: Recent Labs  Lab 01/13/23 1731 01/14/23 0511  WBC 7.2 6.8  NEUTROABS  --  4.2  HGB 11.5* 11.6*  HCT 35.2* 33.8*  MCV 99.2 96.8  PLT 201 182   Basic Metabolic Panel: Recent Labs  Lab 01/13/23 1731 01/14/23 0511  NA 139 138  K 3.8 3.2*  CL 108 107  CO2 23 27  GLUCOSE 103* 94  BUN 19 12  CREATININE 0.94 0.86  CALCIUM 9.2 9.3   GFR: Estimated Creatinine Clearance: 65.1 mL/min (by C-G formula based on SCr of 0.86 mg/dL). Liver Function Tests: Recent Labs  Lab  01/13/23 1731  AST 19  ALT 15  ALKPHOS 38  BILITOT 0.5  PROT 5.8*  ALBUMIN 3.6   Recent Labs  Lab 01/13/23 1731  LIPASE 44   No results for input(s): "AMMONIA" in the last 168 hours. Coagulation Profile: No results for input(s): "INR", "PROTIME" in the last 168 hours. Cardiac Enzymes: No results for input(s): "CKTOTAL", "CKMB", "CKMBINDEX", "TROPONINI" in the last 168 hours. BNP (last 3 results) No results for input(s): "PROBNP" in the last 8760 hours. HbA1C: No results for input(s): "HGBA1C" in the last 72 hours. CBG: No results for input(s): "GLUCAP" in the last 168 hours. Lipid Profile: No results for input(s): "CHOL", "HDL", "LDLCALC", "TRIG", "CHOLHDL", "LDLDIRECT" in the last 72 hours. Thyroid Function Tests: No results for input(s): "  TSH", "T4TOTAL", "FREET4", "T3FREE", "THYROIDAB" in the last 72 hours. Anemia Panel: No results for input(s): "VITAMINB12", "FOLATE", "FERRITIN", "TIBC", "IRON", "RETICCTPCT" in the last 72 hours. Sepsis Labs: No results for input(s): "PROCALCITON", "LATICACIDVEN" in the last 168 hours.  No results found for this or any previous visit (from the past 240 hour(s)).   Radiology Studies: CT ABDOMEN PELVIS W CONTRAST  Result Date: 01/13/2023 CLINICAL DATA:  Abdominal pain and diarrhea. EXAM: CT ABDOMEN AND PELVIS WITH CONTRAST TECHNIQUE: Multidetector CT imaging of the abdomen and pelvis was performed using the standard protocol following bolus administration of intravenous contrast. RADIATION DOSE REDUCTION: This exam was performed according to the departmental dose-optimization program which includes automated exposure control, adjustment of the mA and/or kV according to patient size and/or use of iterative reconstruction technique. CONTRAST:  75mL OMNIPAQUE IOHEXOL 350 MG/ML SOLN COMPARISON:  None Available. FINDINGS: Lower chest: There are findings suspicious for subsegmental right lower lobe pulmonary embolism on image 3/9. Lung bases are  otherwise clear. Hepatobiliary: Patient is status post cholecystectomy. Pneumobilia is present. No biliary ductal dilatation. No focal liver lesions. Pancreas: Unremarkable. No pancreatic ductal dilatation or surrounding inflammatory changes. Spleen: Normal in size without focal abnormality. Adrenals/Urinary Tract: Subcentimeter left cortical renal cyst. Otherwise, kidneys, adrenal glands and bladder are within normal limits. Stomach/Bowel: There is sigmoid colon diverticulosis. No focal inflammation identified. There is questionable mild wall thickening of the descending and sigmoid colon. There is no bowel obstruction, pneumatosis or free air. The appendix is not seen. There is a small hiatal hernia. Otherwise, stomach and small bowel are within normal limits. Vascular/Lymphatic: No significant vascular findings are present. No enlarged abdominal or pelvic lymph nodes. Reproductive: Status post hysterectomy. No adnexal masses. Other: There is scarring in the anterior abdominal wall. No evidence for hernia or ascites. Musculoskeletal: There are degenerative changes at L3-L4. IMPRESSION: 1. Subsegmental right lower lobe pulmonary embolism. 2. Questionable mild wall thickening of the descending and sigmoid colon which may represent mild colitis. 3. Sigmoid colon diverticulosis without evidence for acute diverticulitis. 4. Cholecystectomy with pneumobilia. 5. Small hiatal hernia. 6. Subcentimeter left Bosniak I benign renal cyst. No follow-up imaging is recommended. JACR 2018 Feb; 264-273, Management of the Incidental Renal Mass on CT, RadioGraphics 2021; 814-848, Bosniak Classification of Cystic Renal Masses, Version 2019. These results were called by telephone at the time of interpretation on 01/13/2023 at 11:56 pm to provider Vivien Rossetti , who verbally acknowledged these results. Electronically Signed   By: Darliss Cheney M.D.   On: 01/13/2023 23:56    Scheduled Meds:  allopurinol  300 mg Oral BID   aspirin  EC  81 mg Oral Daily   enoxaparin (LOVENOX) injection  80 mg Subcutaneous BID   fenofibrate  160 mg Oral Daily   gabapentin  300 mg Oral QHS   isosorbide mononitrate  30 mg Oral Daily   potassium chloride  40 mEq Oral Q4H   Continuous Infusions:  0.9 % NaCl with KCl 20 mEq / L 75 mL/hr at 01/14/23 0317   cefTRIAXone (ROCEPHIN)  IV       LOS: 0 days   Hughie Closs, MD [facility]  01/14/2023, 9:30 AM   *Please note that this is a verbal dictation therefore any spelling or grammatical errors are due to the "Dragon Medical One" system interpretation.  Please page via Amion and do not message via secure chat for urgent patient care matters. Secure chat can be used for non urgent patient care matters.  How  to contact the [facility] Attending or Consulting provider [place] or covering provider during after hours [place], for this patient?  Check the care team in [facility] and look for a) attending/consulting [facility] provider listed and b) the [facility] team listed. Page or secure chat [phone]. Log into [email or web address] and use [place]'s universal password to access. If you do not have the password, please contact the hospital operator. Locate the [facility] provider you are looking for under [facility] and page to a number that you can be directly reached. If you still have difficulty reaching the provider, please page the [facility] (Director on Call) for the Hospitalists listed on amion for assistance.

## 2023-01-14 NOTE — Progress Notes (Addendum)
ANTICOAGULATION CONSULT NOTE - Initial Consult  Pharmacy Consult for Apixaban Indication: acute pulmonary embolus  Allergies  Allergen Reactions   Other Other (See Comments)    Some BANDAIDS cause skin irritation    Penicillins Itching     Tolerated Zosyn couse 10/04/22    Patient Measurements: Height: 5\' 6"  (167.6 cm) Weight: 80.3 kg (177 lb 0.5 oz) IBW/kg (Calculated) : 59.3  Vital Signs: Temp: 98.2 F (36.8 C) (05/02 0843) Temp Source: Oral (05/02 0843) BP: 114/37 (05/02 0843) Pulse Rate: 50 (05/02 0843)  Labs: Recent Labs    01/13/23 1731 01/14/23 0511  HGB 11.5* 11.6*  HCT 35.2* 33.8*  PLT 201 182  CREATININE 0.94 0.86    Estimated Creatinine Clearance: 65.1 mL/min (by C-G formula based on SCr of 0.86 mg/dL).   Medical History: Past Medical History:  Diagnosis Date   Arthritis    PAIN AND OA BOTH KNEES AND SHOULDERS AND ELBOWS AND WRIST   Blood transfusion without reported diagnosis 2018   after cholecystectomy   Broken foot, right, closed, initial encounter 09/15/2021   no surgery needed, fell at home and went to ED   Diabetes mellitus    ORAL MEDICATION   GERD (gastroesophageal reflux disease)    Gout    NO RECENT FLARE UPS   H/O: rheumatic fever    AS A CHILD - NO KNOWN HEART MURMUR OR HEART PROBLEMS   Heart attack (HCC)    Hyperlipidemia    Hypertension    PFO (patent foramen ovale)    Stroke (HCC) 03/2017    Medications:  Scheduled:   allopurinol  300 mg Oral BID   aspirin EC  81 mg Oral Daily   enoxaparin (LOVENOX) injection  80 mg Subcutaneous BID   fenofibrate  160 mg Oral Daily   ferrous sulfate  325 mg Oral BID   gabapentin  300 mg Oral QHS   irbesartan  37.5 mg Oral Daily   isosorbide mononitrate  30 mg Oral Daily   potassium chloride  40 mEq Oral Q4H   rosuvastatin  40 mg Oral Daily    Assessment: 72 yo F presents to ED for diarrhea and abdominal pain. Pt found to have a small RLL acute PE on CT abdomen/pelvis on 5/2.  Patient was not taking any anticoagulation prior to admission. Enoxaparin 80mg  [place] x1 was given at 03:24 on 5/2. Pharmacy has been consulted to transition patient from enoxaparin to apixaban for treatment of acute PE.   Hgb 11.6, Plt 182 - stable No s/sx of bleeding noted  Goal of Therapy:  Monitor platelets by anticoagulation protocol: Yes   Plan:  Discontinue enoxaparin Start apixaban 10mg  PO BID x 7 days at 15:00 on 01/14/23, then on 01/21/23 decrease dose to 5mg  PO BID Monitor daily CBC and for s/sx of bleeding Apixaban copay is $0 Pharmacy to provide patient education on apixaban prior to discharge   Wilburn Cornelia, PharmD, BCPS Clinical Pharmacist 01/14/2023 9:58 AM   Please refer to AMION for pharmacy phone number

## 2023-01-14 NOTE — Progress Notes (Signed)
Patient arrived to floor at 0200 from ED, orders for telemetry were placed minutes before patient arrived to unit. Confirmed with Dr. Joneen Roach at [phone]. Dr. Joneen Roach placed transfer orders in for telemetry floor. New bed assignment on 6N room 2. Report call to Bjorn Loser, RN at [phone]. Patient sent on stretcher escorted by the ER transport she arrived on [facility] with.

## 2023-01-14 NOTE — TOC Initial Note (Addendum)
Transition of Care (TOC) - Initial/Assessment Note   Spoke to patient and husband Rachel Black at bedside.   Discussed benefit check for Eliquis came back as zero co pay. TOC Pharmacy can fill prescription at discharge and bring to hospital room prior to patient leaving. Both in agreement. Husband is on Eliquis also.   Await PT recommendations.   PCP Astrid Drafts   Patient has walker at home and currently does OP PT at [place] office on [employer].   [facility]   [address] , [place], [place] [address]  Phone [phone]  Patient Details  Name: Rachel Black MRN: [id number] Date of Birth: Jul 03, 1951  Transition of Care [facility]) CM/SW Contact:    Kingsley Plan, RN Phone Number: 01/14/2023, 11:44 AM  Clinical Narrative:                   Expected Discharge Plan: Home/Self Care Barriers to Discharge: Continued Medical Work up   Patient Goals and CMS Choice Patient states their goals for this hospitalization and ongoing recovery are:: to return to home          Expected Discharge Plan and Services   Discharge Planning Services: CM Consult   Living arrangements for the past 2 months: Single Family Home                 DME Arranged: N/A         HH Arranged: NA          Prior Living Arrangements/Services Living arrangements for the past 2 months: Single Family Home Lives with:: Spouse Patient language and need for interpreter reviewed:: Yes Do you feel safe going back to the place where you live?: Yes      Need for Family Participation in Patient Care: Yes (Comment) Care giver support system in place?: Yes (comment) Current home services: DME Criminal Activity/Legal Involvement Pertinent to Current Situation/Hospitalization: No - Comment as needed  Activities of Daily Living      Permission Sought/Granted   Permission granted to share information with : Yes, Verbal Permission Granted  Share Information with NAME:  Kierria Feigenbaum husband           Emotional Assessment Appearance:: Appears stated age Attitude/Demeanor/Rapport: Engaged Affect (typically observed): Accepting Orientation: : Oriented to Self, Oriented to Place, Oriented to  Time, Oriented to Situation Alcohol / Substance Use: Not Applicable Psych Involvement: No (comment)  Admission diagnosis:  Diarrhea [R19.7] Colitis [K52.9] Diarrhea, unspecified type [R19.7] Single subsegmental pulmonary embolism without acute cor pulmonale (HCC) [I26.93] Patient Active Problem List   Diagnosis Date Noted   Diarrhea 01/14/2023   Colitis 01/14/2023   Acute pulmonary embolism (HCC) 01/14/2023   H/O: CVA (cerebrovascular accident) 01/14/2023   CAD S/P percutaneous coronary angioplasty 01/14/2023   HTN (hypertension), benign 01/14/2023   Atopic dermatitis 11/01/2022   Inflammatory polyarthropathy (HCC) 11/01/2022   Palpitations 10/25/2022   Left hip pain 10/04/2022   Bilateral knee pain 10/04/2022   Acute diverticulitis 10/03/2022   Hypertension associated with diabetes (HCC) 03/23/2022   Epigastric pain 03/16/2022   Irritable bowel syndrome with constipation 03/16/2022   Influenza B 09/16/2021   Acute cystitis without hematuria 09/16/2021   Closed nondisplaced fracture of metatarsal bone of right foot, initial encounter 09/16/2021   Acute metabolic encephalopathy 09/15/2021   Cryptogenic stroke (HCC) 01/13/2021   GERD without esophagitis 11/26/2020   Iron deficiency 11/26/2020   Generalized abdominal pain 11/26/2020   Pneumonia due to  COVID-19 virus    Mixed hyperlipidemia due to type 2 diabetes mellitus (HCC) 11/27/2019   Family history of breast cancer in mother 11/27/2019   Cataract of both eyes 11/27/2019   Coronary artery disease involving native coronary artery of native heart without angina pectoris 02/18/2018   NSTEMI (non-ST elevated myocardial infarction) [facility])    Chest pain of uncertain etiology 10/16/2017   Need for shingles  vaccine 09/21/2017   Need for pneumococcal vaccine 09/21/2017   History of rheumatic fever 06/30/2017   PFO (patent foramen ovale)    Cerebrovascular accident (CVA) due to embolism of right middle cerebral artery (HCC) 03/14/2017   Dilation of biliary tract    Elevated LFTs    RUQ abdominal pain    Acute gallstone pancreatitis 12/07/2016   Choledocholithiasis with obstruction 12/07/2016   AKI (acute kidney injury) (HCC) 12/07/2016   Osteoarthritis of right knee 08/23/2015   Status post total left knee replacement 08/23/2015   Arthritis of knee, right 04/20/2014   Status post total right knee replacement 04/20/2014   Flushing reaction 03/10/2012   Diabetes mellitus (HCC) 02/17/2007   Mixed hyperlipidemia 02/17/2007   GOUT 02/17/2007   Severe obesity (BMI >= 40) (HCC) 02/17/2007   Essential hypertension 02/17/2007   RHEUMATIC FEVER, HX OF 02/17/2007   PCP:  Melida Quitter, PA Pharmacy:   [id number] - [place], [place] - [address] [place] [address] Phone: [phone] Fax: [phone]  [id number] [place], [place] - [address] AT [address] [place] [address] Phone: [phone] Fax: [phone]  [facility] [address] [place] [address] Phone: [phone] Fax: [phone]     Social Determinants of Health (SDOH) Social History: SDOH Screenings   Depression (PHQ2-9): Medium Risk (09/28/2022)  Tobacco Use: Low Risk  (01/13/2023)   SDOH Interventions:     Readmission Risk Interventions     No data to display

## 2023-01-14 NOTE — Telephone Encounter (Signed)
Pharmacy Patient Advocate Encounter  Insurance verification completed.    The patient is insured through [employer]   The patient is currently admitted and ran test claims for the following: Eliquis.  Copays and coinsurance results were relayed to Inpatient clinical team.      

## 2023-01-14 NOTE — ED Notes (Signed)
ED TO INPATIENT HANDOFF REPORT  ED Nurse Name and Phone #: Lanora Manis [phone]  S Name/Age/Gender Rachel Black 72 y.o. female Room/Bed: 006C/006C  Code Status   Code Status: Prior  Home/SNF/Other Home Patient oriented to: self, place, time, and situation Is this baseline? Yes   Triage Complete: Triage complete  Chief Complaint Diarrhea [R19.7]  Triage Note The pt is c/o abd pain with diarrhea for 2 weeks or so   Allergies Allergies  Allergen Reactions   Other Other (See Comments)    Some BANDAIDS cause skin irritation    Penicillins Itching     Tolerated Zosyn couse 10/04/22    Level of Care/Admitting Diagnosis ED Disposition     ED Disposition  Admit   Condition  --   Comment  Hospital Area: [facility] [[id number]]  Level of Care: Med-Surg [16]  May place patient in observation at [facility] or [facility] if equivalent level of care is available:: Yes  Covid Evaluation: Confirmed COVID Negative  Diagnosis: Diarrhea [787.91.ICD-9-CM]  Admitting Physician: Gery Pray [[id number]]  Attending Physician: Gery Pray [[id number]]          B Medical/Surgery History Past Medical History:  Diagnosis Date   Arthritis    PAIN AND OA BOTH KNEES AND SHOULDERS AND ELBOWS AND WRIST   Blood transfusion without reported diagnosis 2018   after cholecystectomy   Broken foot, right, closed, initial encounter 09/15/2021   no surgery needed, fell at home and went to ED   Diabetes mellitus    ORAL MEDICATION   GERD (gastroesophageal reflux disease)    Gout    NO RECENT FLARE UPS   H/O: rheumatic fever    AS A CHILD - NO KNOWN HEART MURMUR OR HEART PROBLEMS   Heart attack (HCC)    Hyperlipidemia    Hypertension    PFO (patent foramen ovale)    Stroke (HCC) 03/2017   Past Surgical History:  Procedure Laterality Date   ABDOMINAL HYSTERECTOMY     CESAREAN SECTION     CHOLECYSTECTOMY N/A 12/09/2016   Procedure: LAPAROSCOPIC CHOLECYSTECTOMY WITH  INTRAOPERATIVE CHOLANGIOGRAM;  Surgeon: Almond Lint, MD;  Location: [facility];  Service: General;  Laterality: N/A;   COLONOSCOPY     COLONOSCOPY WITH PROPOFOL N/A 01/30/2013   Procedure: COLONOSCOPY WITH PROPOFOL;  Surgeon: Hilarie Fredrickson, MD;  Location: [facility]OSCOPY;  Service: Endoscopy;  Laterality: N/A;   ERCP N/A 12/08/2016   Procedure: ENDOSCOPIC RETROGRADE CHOLANGIOPANCREATOGRAPHY (ERCP);  Surgeon: Meryl Dare, MD;  Location: [facility];  Service: Endoscopy;  Laterality: N/A;   ESOPHAGOGASTRODUODENOSCOPY (EGD) WITH PROPOFOL N/A 01/30/2013   Procedure: ESOPHAGOGASTRODUODENOSCOPY (EGD) WITH PROPOFOL;  Surgeon: Hilarie Fredrickson, MD;  Location: [facility]OS[facility];  Service: Endoscopy;  Laterality: N/A;   LEFT HEART CATH AND CORONARY ANGIOGRAPHY N/A 10/18/2017   Procedure: LEFT HEART CATH AND CORONARY ANGIOGRAPHY;  Surgeon: Swaziland, Peter M, MD;  Location: [facility] INVASIVE CV LAB;  Service: Cardiovascular;  Laterality: N/A;   LOOP RECORDER INSERTION N/A 03/18/2017   Procedure: Loop Recorder Insertion;  Surgeon: Regan Lemming, MD;  Location: [facility] INVASIVE CV LAB;  Service: Cardiovascular;  Laterality: N/A;   PARTIAL HYSTERECTOMY     TEE WITHOUT CARDIOVERSION N/A 03/16/2017   Procedure: TRANSESOPHAGEAL ECHOCARDIOGRAM (TEE);  Surgeon: Elease Hashimoto Deloris Ping, MD;  Location: [facility] ENDOSCOPY;  Service: Cardiovascular;  Laterality: N/A;   TOTAL KNEE ARTHROPLASTY Right 04/20/2014   Procedure: RIGHT TOTAL KNEE ARTHROPLASTY CONVERTED TO RIGHT KNEE REIMPLANTATION;  Surgeon: Kathryne Hitch, MD;  Location: [facility];  Service: Orthopedics;  Laterality: Right;   TOTAL KNEE ARTHROPLASTY Left 08/23/2015   Procedure: LEFT TOTAL KNEE ARTHROPLASTY;  Surgeon: Kathryne Hitch, MD;  Location: [facility];  Service: Orthopedics;  Laterality: Left;   VENTRAL HERNIA REPAIR     x2     A IV Location/Drains/Wounds Patient Lines/Drains/Airways Status     Active Line/Drains/Airways     Name Placement date Placement time Site  Days   Peripheral IV 01/13/23 20 G Anterior;Proximal;Right Forearm 01/13/23  2149  Forearm  1   Incision - 4 Ports Abdomen 1: Right;Lower 2: Right;Superior 3: Umbilicus 4: Umbilicus;Superior 12/09/16  1446  -- 2227            Intake/Output Last 24 hours  Intake/Output Summary (Last 24 hours) at 01/14/2023 0102 Last data filed at 01/14/2023 0035 Gross per 24 hour  Intake 1000 ml  Output --  Net 1000 ml    Labs/Imaging Results for orders placed or performed during the hospital encounter of 01/13/23 (from the past 48 hour(s))  Lipase, blood     Status: None   Collection Time: 01/13/23  5:31 PM  Result Value Ref Range   Lipase 44 11 - 51 U/L    Comment: Performed at [facility], [address], [place], [place] [address]  Comprehensive metabolic panel     Status: Abnormal   Collection Time: 01/13/23  5:31 PM  Result Value Ref Range   Sodium 139 135 - 145 mmol/L   Potassium 3.8 3.5 - 5.1 mmol/L   Chloride 108 98 - 111 mmol/L   CO2 23 22 - 32 mmol/L   Glucose, Bld 103 (H) 70 - 99 mg/dL    Comment: Glucose reference range applies only to samples taken after fasting for at least 8 hours.   BUN 19 8 - 23 mg/dL   Creatinine, Ser 6.04 0.44 - 1.00 mg/dL   Calcium 9.2 8.9 - 54.0 mg/dL   Total Protein 5.8 (L) 6.5 - 8.1 g/dL   Albumin 3.6 3.5 - 5.0 g/dL   AST 19 15 - 41 U/L   ALT 15 0 - 44 U/L   Alkaline Phosphatase 38 38 - 126 U/L   Total Bilirubin 0.5 0.3 - 1.2 mg/dL   GFR, Estimated >98 >11 mL/min    Comment: (NOTE) Calculated using the CKD-EPI Creatinine Equation (2021)    Anion gap 8 5 - 15    Comment: Performed at [facility], [address], [place], [place] [address]  CBC     Status: Abnormal   Collection Time: 01/13/23  5:31 PM  Result Value Ref Range   WBC 7.2 4.0 - 10.5 K/uL   RBC 3.55 (L) 3.87 - 5.11 MIL/uL   Hemoglobin 11.5 (L) 12.0 - 15.0 g/dL   HCT 29.5 (L) 62.1 - 30.8 %   MCV 99.2 80.0 - 100.0 fL   MCH 32.4 26.0 - 34.0 pg   MCHC 32.7 30.0 - 36.0  g/dL   RDW 65.7 84.6 - 96.2 %   Platelets 201 150 - 400 K/uL   nRBC 0.0 0.0 - 0.2 %    Comment: Performed at [facility], [address] [place], [place] [address]  Urinalysis, Routine w reflex microscopic -Urine, Clean Catch     Status: Abnormal   Collection Time: 01/13/23  5:31 PM  Result Value Ref Range   Color, Urine YELLOW YELLOW   APPearance HAZY (A) CLEAR   Specific Gravity, Urine  1.025 1.005 - 1.030   pH 5.0 5.0 - 8.0   Glucose, UA NEGATIVE NEGATIVE mg/dL   Hgb urine dipstick NEGATIVE NEGATIVE   Bilirubin Urine NEGATIVE NEGATIVE   Ketones, ur NEGATIVE NEGATIVE mg/dL   Protein, ur NEGATIVE NEGATIVE mg/dL   Nitrite POSITIVE (A) NEGATIVE   Leukocytes,Ua TRACE (A) NEGATIVE   RBC / HPF 0-5 0 - 5 RBC/hpf   WBC, UA 0-5 0 - 5 WBC/hpf   Bacteria, UA FEW (A) NONE SEEN   Squamous Epithelial / HPF 0-5 0 - 5 /HPF   Mucus PRESENT     Comment: Performed at [facility], [address] [place], [place] [address]   CT ABDOMEN PELVIS W CONTRAST  Result Date: 01/13/2023 CLINICAL DATA:  Abdominal pain and diarrhea. EXAM: CT ABDOMEN AND PELVIS WITH CONTRAST TECHNIQUE: Multidetector CT imaging of the abdomen and pelvis was performed using the standard protocol following bolus administration of intravenous contrast. RADIATION DOSE REDUCTION: This exam was performed according to the departmental dose-optimization program which includes automated exposure control, adjustment of the mA and/or kV according to patient size and/or use of iterative reconstruction technique. CONTRAST:  75mL OMNIPAQUE IOHEXOL 350 MG/ML SOLN COMPARISON:  None Available. FINDINGS: Lower chest: There are findings suspicious for subsegmental right lower lobe pulmonary embolism on image 3/9. Lung bases are otherwise clear. Hepatobiliary: Patient is status post cholecystectomy. Pneumobilia is present. No biliary ductal dilatation. No focal liver lesions. Pancreas: Unremarkable. No pancreatic ductal dilatation or  surrounding inflammatory changes. Spleen: Normal in size without focal abnormality. Adrenals/Urinary Tract: Subcentimeter left cortical renal cyst. Otherwise, kidneys, adrenal glands and bladder are within normal limits. Stomach/Bowel: There is sigmoid colon diverticulosis. No focal inflammation identified. There is questionable mild wall thickening of the descending and sigmoid colon. There is no bowel obstruction, pneumatosis or free air. The appendix is not seen. There is a small hiatal hernia. Otherwise, stomach and small bowel are within normal limits. Vascular/Lymphatic: No significant vascular findings are present. No enlarged abdominal or pelvic lymph nodes. Reproductive: Status post hysterectomy. No adnexal masses. Other: There is scarring in the anterior abdominal wall. No evidence for hernia or ascites. Musculoskeletal: There are degenerative changes at L3-L4. IMPRESSION: 1. Subsegmental right lower lobe pulmonary embolism. 2. Questionable mild wall thickening of the descending and sigmoid colon which may represent mild colitis. 3. Sigmoid colon diverticulosis without evidence for acute diverticulitis. 4. Cholecystectomy with pneumobilia. 5. Small hiatal hernia. 6. Subcentimeter left Bosniak I benign renal cyst. No follow-up imaging is recommended. JACR 2018 Feb; 264-273, Management of the Incidental Renal Mass on CT, [employer] 2021; 814-848, Bosniak Classification of Cystic Renal Masses, Version 2019. These results were called by telephone at the time of interpretation on 01/13/2023 at 11:56 pm to provider Vivien Rossetti , who verbally acknowledged these results. Electronically Signed   By: Darliss Cheney M.D.   On: 01/13/2023 23:56    Pending Labs Unresulted Labs (From admission, onward)     Start     Ordered   01/14/23 0012  C Difficile Quick Screen w PCR reflex  (C Difficile quick screen w PCR reflex panel )  Once, for 24 hours,   URGENT       References:    CDiff Information Tool    01/14/23 0011   01/13/23 2111  Gastrointestinal Panel by PCR , Stool  (Gastrointestinal Panel by PCR, Stool                                                                                                                                                     **  Does Not include CLOSTRIDIUM DIFFICILE testing. **If CDIFF testing is needed, place order from the "C Difficile Testing" order set.**)  Once,   URGENT        01/13/23 2110            Vitals/Pain Today's Vitals   01/13/23 1726 01/13/23 2058 01/14/23 0038  BP:  123/62   Pulse:  71   Resp:  16   Temp:  98.6 F (37 C)   TempSrc:  Oral   SpO2:  100%   Weight: 80.3 kg    Height: 5\' 6"  (1.676 m)    PainSc: 6   0-No pain    Isolation Precautions Enteric precautions (UV disinfection)  Medications Medications  metroNIDAZOLE (FLAGYL) IVPB 500 mg (500 mg Intravenous New Bag/Given 01/14/23 0034)  lactated ringers bolus 1,000 mL (0 mLs Intravenous Stopped 01/14/23 0035)  cefTRIAXone (ROCEPHIN) 1 g in sodium chloride 0.9 % 100 mL IVPB (0 g Intravenous Stopped 01/13/23 2242)  morphine (PF) 4 MG/ML injection 4 mg (4 mg Intravenous Given 01/13/23 2153)  iohexol (OMNIPAQUE) 350 MG/ML injection 75 mL (75 mLs Intravenous Contrast Given 01/13/23 2336)    Mobility walks     Focused Assessments Pulmonary Assessment Handoff:  Lung sounds:  clear, but decreased in lower lobes.         R Recommendations: See Admitting Provider Note  Report given to:   Additional Notes: occult blood negative. For some reason it is taking a long time to cross over.

## 2023-01-14 NOTE — Evaluation (Signed)
Physical Therapy Evaluation and Discharge Patient Details Name: Rachel Black MRN: [id number] DOB: 11-06-1950 Today's Date: 01/14/2023  History of Present Illness  Pt is a 72 y/o female presenting on 01/13/23 for ongoing diarrhea. CTA abdomen and pelvis shows a small RLL PE, mild colitis.  PMH includes: arthritis, DM, gout, PFO, CVA, HTN, ERCP, TKA bil., CAD  Clinical Impression   Patient evaluated by Physical Therapy with no further acute PT needs identified. She performs bed mobility, transfers and gait at modified independent level with use of RW.  PT is signing off. Thank you for this referral.        Recommendations for follow up therapy are one component of a multi-disciplinary discharge planning process, led by the attending physician.  Recommendations may be updated based on patient status, additional functional criteria and insurance authorization.  Follow Up Recommendations       Assistance Recommended at Discharge PRN  Patient can return home with the following  Help with stairs or ramp for entrance;Assistance with cooking/housework    Equipment Recommendations None recommended by PT  Recommendations for Other Services       Functional Status Assessment Patient has not had a recent decline in their functional status     Precautions / Restrictions Precautions Precautions: Fall      Mobility  Bed Mobility Overal bed mobility: Independent                  Transfers Overall transfer level: Modified independent Equipment used: Rolling walker (2 wheels)                    Ambulation/Gait Ambulation/Gait assistance: Modified independent (Device/Increase time) Gait Distance (Feet): 90 Feet Assistive device: Rolling walker (2 wheels) Gait Pattern/deviations: Step-through pattern, Decreased stride length   Gait velocity interpretation: 1.31 - 2.62 ft/sec, indicative of limited community ambulator   General Gait Details: No imbalance; proper use of  RW  Stairs            Wheelchair Mobility    Modified Rankin (Stroke Patients Only)       Balance Overall balance assessment: Modified Independent                                           Pertinent Vitals/Pain Pain Assessment Pain Assessment: Faces Faces Pain Scale: Hurts a little bit Pain Location: left knee Pain Descriptors / Indicators: Aching Pain Intervention(s): Limited activity within patient's tolerance, Monitored during session    Home Living Family/patient expects to be discharged to:: Private residence Living Arrangements: Spouse/significant other Available Help at Discharge: Family;Available PRN/intermittently Type of Home: House Home Access: Stairs to enter Entrance Stairs-Rails: None Entrance Stairs-Number of Steps: 2 Alternate Level Stairs-Number of Steps: 2 (down into living room--pt does not have to access) Home Layout: Two level Home Equipment: Agricultural consultant (2 wheels);Shower seat;Grab bars - tub/shower;Grab bars - toilet;Rollator (4 wheels) Additional Comments: spouse works 5 am-3:30    Prior Function Prior Level of Function : Independent/Modified Independent             Mobility Comments: using rolling walker at home; assist/hand on the steps ADLs Comments: independent with basic ADLs; husband does IADLs     Hand Dominance   Dominant Hand: Right    Extremity/Trunk Assessment   Upper Extremity Assessment Upper Extremity Assessment: Overall WFL for tasks assessed    Lower Extremity  Assessment Lower Extremity Assessment: Generalized weakness    Cervical / Trunk Assessment Cervical / Trunk Assessment: Normal  Communication   Communication: No difficulties  Cognition Arousal/Alertness: Awake/alert Behavior During Therapy: WFL for tasks assessed/performed Overall Cognitive Status: Within Functional Limits for tasks assessed                                          General Comments General  comments (skin integrity, edema, etc.): Husband present    Exercises     Assessment/Plan    PT Assessment Patient does not need any further PT services  PT Problem List         PT Treatment Interventions      PT Goals (Current goals can be found in the Care Plan section)  Acute Rehab PT Goals PT Goal Formulation: All assessment and education complete, DC therapy    Frequency       Co-evaluation               AM-PAC PT "6 Clicks" Mobility  Outcome Measure Help needed turning from your back to your side while in a flat bed without using bedrails?: None Help needed moving from lying on your back to sitting on the side of a flat bed without using bedrails?: None Help needed moving to and from a bed to a chair (including a wheelchair)?: None Help needed standing up from a chair using your arms (e.g., wheelchair or bedside chair)?: None Help needed to walk in hospital room?: None Help needed climbing 3-5 steps with a railing? : A Little 6 Click Score: 23    End of Session   Activity Tolerance: Patient tolerated treatment well Patient left: in bed;with call bell/phone within reach;with family/visitor present   PT Visit Diagnosis: Difficulty in walking, not elsewhere classified (R26.2)    Time: 1610-9604 PT Time Calculation (min) (ACUTE ONLY): 21 min   Charges:   PT Evaluation $PT Eval Low Complexity: 1 Low           Jerolyn Center, PT Acute Rehabilitation Services  Office [phone]   Zena Amos 01/14/2023, 2:27 PM

## 2023-01-14 NOTE — Progress Notes (Addendum)
Admitted to 6n2 from [facility]. Ambulated in room with walker. Denies nausea/pain at this time. Husband at bedside. Abigail RN notified by Fortino Sic (charge nurse) of pts arrival to unit.

## 2023-01-14 NOTE — Discharge Instructions (Addendum)
   ______________________________________________________________________________________________________________________________________ Information on my medicine - ELIQUIS (apixaban)  This medication education was reviewed with me or my healthcare representative as part of my discharge preparation.    Why was Eliquis prescribed for you? Eliquis was prescribed to treat blood clots that may have been found in the veins of your legs (deep vein thrombosis) or in your lungs (pulmonary embolism) and to reduce the risk of them occurring again.  What do You need to know about Eliquis ? The starting dose is 10 mg (two 5 mg tablets) taken TWICE daily for the FIRST SEVEN (7) DAYS, then on THURSDAY 01/21/23  the dose is reduced to ONE 5 mg tablet taken TWICE daily.  Eliquis may be taken with or without food.   Try to take the dose about the same time in the morning and in the evening. If you have difficulty swallowing the tablet whole please discuss with your pharmacist how to take the medication safely.  Take Eliquis exactly as prescribed and DO NOT stop taking Eliquis without talking to the doctor who prescribed the medication.  Stopping may increase your risk of developing a new blood clot.  Refill your prescription before you run out.  After discharge, you should have regular check-up appointments with your healthcare provider that is prescribing your Eliquis.    What do you do if you miss a dose? If a dose of ELIQUIS is not taken at the scheduled time, take it as soon as possible on the same day and twice-daily administration should be resumed. The dose should not be doubled to make up for a missed dose.  Important Safety Information A possible side effect of Eliquis is bleeding. You should call your healthcare provider right away if you experience any of the following: Bleeding from an injury or your nose that does not stop. Unusual colored urine (red or dark brown) or unusual colored  stools (red or black). Unusual bruising for unknown reasons. A serious fall or if you hit your head (even if there is no bleeding).  Some medicines may interact with Eliquis and might increase your risk of bleeding or clotting while on Eliquis. To help avoid this, consult your healthcare provider or pharmacist prior to using any new prescription or non-prescription medications, including herbals, vitamins, non-steroidal anti-inflammatory drugs (NSAIDs) and supplements.  This website has more information on Eliquis (apixaban): http://www.eliquis.com/eliquis/home    Please discuss with your PCP and arrange outpatient sleep study to rule out sleep apnea.

## 2023-01-14 NOTE — Plan of Care (Signed)
  Problem: Education: Goal: Knowledge of General Education information will improve Description: Including pain rating scale, medication(s)/side effects and non-pharmacologic comfort measures Outcome: Progressing   Problem: Clinical Measurements: Goal: Respiratory complications will improve Outcome: Progressing   Problem: Nutrition: Goal: Adequate nutrition will be maintained Outcome: Progressing   

## 2023-01-14 NOTE — TOC Benefit Eligibility Note (Signed)
Patient Product/process development scientist completed.    The patient is currently admitted and upon discharge could be taking Eliquis 5 mg.  The current 30 day co-pay is $0.00.   The patient is insured through [employer]   This test claim was processed through [employer]- copay amounts may vary at other pharmacies due to pharmacy/plan contracts, or as the patient moves through the different stages of their insurance plan.  Roland Earl, CPHT Pharmacy Patient Advocate Specialist [facility] Patient Advocate Team Direct Number: [phone]  Fax: [phone]

## 2023-01-14 NOTE — H&P (Signed)
PCP:   Melida Quitter, PA   Chief Complaint:  Diarrhea  HPI: This is a 72 year old woman female with PMHx of gout, HLD, HTN, CAD, osteoarthritis, PFO, and h/o CVA.  On Monday, patient called her husband to say that she was having bad diarrhea and her stomach is bothering her.  She has not been eating much, she has had approximately 70 pound weight loss over the last year.  She denies nausea, vomiting, lightheadedness or dizziness.  She denies fevers or chills, or burning urination.  Per husband she has abdominal issues, be maintained on Linzess.  She was on antibiotics sometime within the last 3 months for UTI.  Patient's last colonoscopy was 10/03/2021, polyps only noted.  Last EGD 04/10/2022 showed this is a large-caliber ringlike structure and multiple gastric polyps. Her husband brought her in for ongoing diarrhea.  In the ER patient is occult stool negative.  CTA abdomen and pelvis shows a small RLL PE, mild colitis.  Labs essentially normal.  Hospitalist asked to admit.  Review of Systems:  Per above  Past Medical History: Past Medical History:  Diagnosis Date   Arthritis    PAIN AND OA BOTH KNEES AND SHOULDERS AND ELBOWS AND WRIST   Blood transfusion without reported diagnosis 2018   after cholecystectomy   Broken foot, right, closed, initial encounter 09/15/2021   no surgery needed, fell at home and went to ED   Diabetes mellitus    ORAL MEDICATION   GERD (gastroesophageal reflux disease)    Gout    NO RECENT FLARE UPS   H/O: rheumatic fever    AS A CHILD - NO KNOWN HEART MURMUR OR HEART PROBLEMS   Heart attack (HCC)    Hyperlipidemia    Hypertension    PFO (patent foramen ovale)    Stroke (HCC) 03/2017   Past Surgical History:  Procedure Laterality Date   ABDOMINAL HYSTERECTOMY     CESAREAN SECTION     CHOLECYSTECTOMY N/A 12/09/2016   Procedure: LAPAROSCOPIC CHOLECYSTECTOMY WITH INTRAOPERATIVE CHOLANGIOGRAM;  Surgeon: Almond Lint, MD;  Location: [facility];  Service:  General;  Laterality: N/A;   COLONOSCOPY     COLONOSCOPY WITH PROPOFOL N/A 01/30/2013   Procedure: COLONOSCOPY WITH PROPOFOL;  Surgeon: Hilarie Fredrickson, MD;  Location: [facility];  Service: Endoscopy;  Laterality: N/A;   ERCP N/A 12/08/2016   Procedure: ENDOSCOPIC RETROGRADE CHOLANGIOPANCREATOGRAPHY (ERCP);  Surgeon: Meryl Dare, MD;  Location: [facility];  Service: Endoscopy;  Laterality: N/A;   ESOPHAGOGASTRODUODENOSCOPY (EGD) WITH PROPOFOL N/A 01/30/2013   Procedure: ESOPHAGOGASTRODUODENOSCOPY (EGD) WITH PROPOFOL;  Surgeon: Hilarie Fredrickson, MD;  Location: [facility];  Service: Endoscopy;  Laterality: N/A;   LEFT HEART CATH AND CORONARY ANGIOGRAPHY N/A 10/18/2017   Procedure: LEFT HEART CATH AND CORONARY ANGIOGRAPHY;  Surgeon: Swaziland, Peter M, MD;  Location: [facility] INVASIVE CV LAB;  Service: Cardiovascular;  Laterality: N/A;   LOOP RECORDER INSERTION N/A 03/18/2017   Procedure: Loop Recorder Insertion;  Surgeon: Regan Lemming, MD;  Location: [facility] INVASIVE CV LAB;  Service: Cardiovascular;  Laterality: N/A;   PARTIAL HYSTERECTOMY     TEE WITHOUT CARDIOVERSION N/A 03/16/2017   Procedure: TRANSESOPHAGEAL ECHOCARDIOGRAM (TEE);  Surgeon: Elease Hashimoto Deloris Ping, MD;  Location: [facility] ENDOSCOPY;  Service: Cardiovascular;  Laterality: N/A;   TOTAL KNEE ARTHROPLASTY Right 04/20/2014   Procedure: RIGHT TOTAL KNEE ARTHROPLASTY CONVERTED TO RIGHT KNEE REIMPLANTATION;  Surgeon: Kathryne Hitch, MD;  Location: [facility];  Service: Orthopedics;  Laterality: Right;   TOTAL KNEE ARTHROPLASTY  Left 08/23/2015   Procedure: LEFT TOTAL KNEE ARTHROPLASTY;  Surgeon: Kathryne Hitch, MD;  Location: [facility];  Service: Orthopedics;  Laterality: Left;   VENTRAL HERNIA REPAIR     x2    Medications: Prior to Admission medications   Medication Sig Start Date End Date Taking? Authorizing Provider  ACCU-CHEK AVIVA PLUS test strip USE AS INSTRUCTED ONCE A DAY. DX CODE E11.9 07/20/22   Mayer Masker, PA-C  Accu-Chek  Softclix Lancets lancets Use as instructed once a day.  DX Code: E11.9 03/24/22   Mayer Masker, PA-C  acetaminophen-codeine (TYLENOL #3) 300-30 MG tablet Take 1 tablet by mouth every 4 (four) hours as needed for moderate pain. 12/03/22   Kirtland Bouchard, PA-C  allopurinol (ZYLOPRIM) 300 MG tablet TAKE 1 TABLET BY MOUTH TWICE A DAY 08/27/22   Carlean Jews, NP  aspirin 81 MG EC tablet Take 1 tablet (81 mg total) by mouth daily. 10/18/17   Laverda Page B, NP  CALCIUM PO Take 2 tablets by mouth daily.    [provider]  Emollient (UDDERLY SMOOTH) CREA Apply 1 application topically See admin instructions. Apply topically to skin folds as needed for rash/irritation    [provider]  famotidine (PEPCID) 10 MG tablet Take 10 mg by mouth daily as needed for heartburn or indigestion.    [provider]  fenofibrate 160 MG tablet Take 1 tablet (160 mg total) by mouth daily. 09/10/22   Carlean Jews, NP  ferrous sulfate 325 (65 FE) MG tablet TAKE 1 TABLET BY MOUTH TWICE A DAY Patient taking differently: Take 325 mg by mouth daily. 08/03/22   Zehr, Princella Pellegrini, PA-C  gabapentin (NEURONTIN) 300 MG capsule Take 1 capsule (300 mg total) by mouth at bedtime. 12/21/22   Kathryne Hitch, MD  isosorbide mononitrate (IMDUR) 60 MG 24 hr tablet Take 1 tablet (60 mg total) by mouth daily. Patient taking differently: Take 30 mg by mouth daily. 03/09/22   Rollene Rotunda, MD  LINZESS 72 MCG capsule TAKE 1 CAPSULE BY MOUTH DAILY BEFORE BREAKFAST. 12/15/22   Saralyn Pilar A, PA  olmesartan (BENICAR) 5 MG tablet Take 1 tablet (5 mg total) by mouth daily. 10/23/22   Carlean Jews, NP  rosuvastatin (CRESTOR) 40 MG tablet TAKE 1 TABLET BY MOUTH EVERY DAY 01/04/23   Rollene Rotunda, MD  triamcinolone (KENALOG) 0.025 % cream Apply 1 Application topically 2 (two) times daily. 09/28/22   Carlean Jews, NP    Allergies:   Allergies  Allergen Reactions   Other Other (See Comments)     Some BANDAIDS cause skin irritation    Penicillins Itching     Tolerated Zosyn couse 10/04/22    Social History:  reports that she has never smoked. She has never used smokeless tobacco. She reports that she does not currently use alcohol. She reports that she does not use drugs.  Family History: Family History  Problem Relation Age of Onset   Diabetes Mother    Hypertension Mother        entire family   Breast cancer Mother        diagnosed in her 52's   Stroke Father        CVA   Hyperlipidemia Father        entire family   Diabetes Father    Heart disease Father        No details.  Not at an early age.     Crohn's disease Son  Irritable bowel syndrome Son    Colon cancer Neg Hx    Colon polyps Neg Hx    Esophageal cancer Neg Hx    Stomach cancer Neg Hx    Rectal cancer Neg Hx     Physical Exam: Vitals:   01/13/23 1726 01/13/23 2058  BP:  123/62  Pulse:  71  Resp:  16  Temp:  98.6 F (37 C)  TempSrc:  Oral  SpO2:  100%  Weight: 80.3 kg   Height: 5\' 6"  (1.676 m)     General:  Alert and oriented times three, well developed and nourished, no acute distress.  Chronically ill-appearing female Eyes: PERRLA, pink conjunctiva, no scleral icterus ENT: Moist oral mucosa, neck supple, no thyromegaly Lungs: clear to ascultation, no wheeze, no crackles, no use of accessory muscles Cardiovascular: regular rate and rhythm, no regurgitation, no gallops, no murmurs. No carotid bruits, no JVD Abdomen: soft, positive BS, non-tender, non-distended, no organomegaly, not an acute abdomen GU: not examined Neuro: CN II - XII grossly intact, sensation intact Musculoskeletal: strength 5/5 all extremities. RLE >LLE Skin: no rash, no subcutaneous crepitation, no decubitus Psych: appropriate patient  Labs on Admission:  Recent Labs    01/13/23 1731  NA 139  K 3.8  CL 108  CO2 23  GLUCOSE 103*  BUN 19  CREATININE 0.94  CALCIUM 9.2   Recent Labs    01/13/23 1731   AST 19  ALT 15  ALKPHOS 38  BILITOT 0.5  PROT 5.8*  ALBUMIN 3.6   Recent Labs    01/13/23 1731  LIPASE 44   Recent Labs    01/13/23 1731  WBC 7.2  HGB 11.5*  HCT 35.2*  MCV 99.2  PLT 201      Radiological Exams on Admission: CT ABDOMEN PELVIS W CONTRAST  Result Date: 01/13/2023 CLINICAL DATA:  Abdominal pain and diarrhea. EXAM: CT ABDOMEN AND PELVIS WITH CONTRAST TECHNIQUE: Multidetector CT imaging of the abdomen and pelvis was performed using the standard protocol following bolus administration of intravenous contrast. RADIATION DOSE REDUCTION: This exam was performed according to the departmental dose-optimization program which includes automated exposure control, adjustment of the mA and/or kV according to patient size and/or use of iterative reconstruction technique. CONTRAST:  75mL OMNIPAQUE IOHEXOL 350 MG/ML SOLN COMPARISON:  None Available. FINDINGS: Lower chest: There are findings suspicious for subsegmental right lower lobe pulmonary embolism on image 3/9. Lung bases are otherwise clear. Hepatobiliary: Patient is status post cholecystectomy. Pneumobilia is present. No biliary ductal dilatation. No focal liver lesions. Pancreas: Unremarkable. No pancreatic ductal dilatation or surrounding inflammatory changes. Spleen: Normal in size without focal abnormality. Adrenals/Urinary Tract: Subcentimeter left cortical renal cyst. Otherwise, kidneys, adrenal glands and bladder are within normal limits. Stomach/Bowel: There is sigmoid colon diverticulosis. No focal inflammation identified. There is questionable mild wall thickening of the descending and sigmoid colon. There is no bowel obstruction, pneumatosis or free air. The appendix is not seen. There is a small hiatal hernia. Otherwise, stomach and small bowel are within normal limits. Vascular/Lymphatic: No significant vascular findings are present. No enlarged abdominal or pelvic lymph nodes. Reproductive: Status post hysterectomy. No  adnexal masses. Other: There is scarring in the anterior abdominal wall. No evidence for hernia or ascites. Musculoskeletal: There are degenerative changes at L3-L4. IMPRESSION: 1. Subsegmental right lower lobe pulmonary embolism. 2. Questionable mild wall thickening of the descending and sigmoid colon which may represent mild colitis. 3. Sigmoid colon diverticulosis without evidence for acute diverticulitis. 4. Cholecystectomy  with pneumobilia. 5. Small hiatal hernia. 6. Subcentimeter left Bosniak I benign renal cyst. No follow-up imaging is recommended. JACR 2018 Feb; 264-273, Management of the Incidental Renal Mass on CT, [employer] 2021; [id number], Bosniak Classification of Cystic Renal Masses, Version 2019. These results were called by telephone at the time of interpretation on 01/13/2023 at 11:56 pm to provider Vivien Rossetti , who verbally acknowledged these results. Electronically Signed   By: Darliss Cheney M.D.   On: 01/13/2023 23:56    Assessment/Plan Present on Admission:  Small RLL PE -r/o DVT.  RLE to the left. -Anticoagulation, Lovenox SQ every 12 ordered therapeutic dosing -echo not ordered, no evidence of heart strain  Diarrhea/colitis -Occult stool ordered and negative -C. difficile, GI panel ordered -IV Rocephin and Flagyl initiated by ER.  Continued -IV fluid hydration -Linzess on hold -No PPI -Patient's abdominal exam benign, no significant tenderness to palpation -Imodium PRN once C. difficile negative  Weight loss ->70 pounds.  Some of the weight loss was deliberate -CT abdomen pelvis without evidence of malignancy  -Tumor marker CEA ordered  -Gout -Not resumed   Hypertension  -Imdur and Benicar resumed -Monitor creatinine creatinine with diarrhea   Mixed hyperlipidemia -Crestor and fenofibrate resumed   Fardowsa Authier 01/14/2023, 12:20 AM

## 2023-01-15 ENCOUNTER — Other Ambulatory Visit: Payer: Self-pay | Admitting: Nurse Practitioner

## 2023-01-15 ENCOUNTER — Other Ambulatory Visit (HOSPITAL_COMMUNITY): Payer: Self-pay

## 2023-01-15 DIAGNOSIS — E1159 Type 2 diabetes mellitus with other circulatory complications: Secondary | ICD-10-CM

## 2023-01-15 DIAGNOSIS — A09 Infectious gastroenteritis and colitis, unspecified: Secondary | ICD-10-CM | POA: Diagnosis not present

## 2023-01-15 LAB — GASTROINTESTINAL PANEL BY PCR, STOOL (REPLACES STOOL CULTURE)

## 2023-01-15 LAB — CBC
HCT: 33.7 % — ABNORMAL LOW (ref 36.0–46.0)
Hemoglobin: 10.9 g/dL — ABNORMAL LOW (ref 12.0–15.0)
MCH: 32.1 pg (ref 26.0–34.0)
MCHC: 32.3 g/dL (ref 30.0–36.0)
MCV: 99.1 fL (ref 80.0–100.0)
Platelets: 176 10*3/uL (ref 150–400)
RBC: 3.4 MIL/uL — ABNORMAL LOW (ref 3.87–5.11)
RDW: 14.1 % (ref 11.5–15.5)
WBC: 5.1 10*3/uL (ref 4.0–10.5)
nRBC: 0 % (ref 0.0–0.2)

## 2023-01-15 LAB — BASIC METABOLIC PANEL
Anion gap: 10 (ref 5–15)
BUN: 12 mg/dL (ref 8–23)
CO2: 22 mmol/L (ref 22–32)
Calcium: 9.2 mg/dL (ref 8.9–10.3)
Chloride: 108 mmol/L (ref 98–111)
Creatinine, Ser: 1.07 mg/dL — ABNORMAL HIGH (ref 0.44–1.00)
GFR, Estimated: 56 mL/min — ABNORMAL LOW (ref >60)
Glucose, Bld: 91 mg/dL (ref 70–99)
Potassium: 4.3 mmol/L (ref 3.5–5.1)
Sodium: 140 mmol/L (ref 135–145)

## 2023-01-15 LAB — CEA: CEA: 2.1 ng/mL (ref 0.0–4.7)

## 2023-01-15 MED ORDER — APIXABAN (ELIQUIS) VTE STARTER PACK (10MG AND 5MG)
ORAL_TABLET | ORAL | 0 refills | Status: DC
  Filled 2023-01-15: qty 74, 30d supply, fill #0

## 2023-01-15 MED ORDER — CIPROFLOXACIN HCL 500 MG PO TABS
500.0000 mg | ORAL_TABLET | Freq: Two times a day (BID) | ORAL | 0 refills | Status: DC
  Filled 2023-01-15: qty 14, 7d supply, fill #0

## 2023-01-15 MED ORDER — METRONIDAZOLE 500 MG PO TABS
500.0000 mg | ORAL_TABLET | Freq: Three times a day (TID) | ORAL | 0 refills | Status: DC
  Filled 2023-01-15: qty 21, 7d supply, fill #0

## 2023-01-15 NOTE — Discharge Summary (Signed)
Physician Discharge Summary  Rachel Black ZOX:[id number] DOB: May 02, 1951 DOA: 01/13/2023  PCP: Melida Quitter, PA  Admit date: 01/13/2023 Discharge date: 01/15/2023    Admitted From: Home Disposition: Home  Recommendations for Outpatient Follow-up:  Follow up with PCP in 1-2 weeks Please obtain BMP/CBC in one week Please discuss with your PCP and arrange outpatient sleep study to rule out sleep apnea. Please follow up with your PCP on the following pending results: Unresulted Labs (From admission, onward)     Start     Ordered   01/15/23 0500  CBC  Daily,   R      01/14/23 0957   01/14/23 0012  C Difficile Quick Screen w PCR reflex  (C Difficile quick screen w PCR reflex panel )  Once, for 24 hours,   URGENT       References:    CDiff Information Tool   01/14/23 0011   01/13/23 2111  Gastrointestinal Panel by PCR , Stool  (Gastrointestinal Panel by PCR, Stool                                                                                                                                                     **Does Not include CLOSTRIDIUM DIFFICILE testing. **If CDIFF testing is needed, place order from the "C Difficile Testing" order set.**)  Once,   URGENT        01/13/23 2110              Home Health: None Equipment/Devices: None  Discharge Condition: Stable CODE STATUS: Full code Diet recommendation: Cardiac  Subjective: Seen in his.  Mild abdominal cramping but no other complaint.  No nausea.  Tolerating diet.  Has not had any bowel movement since last 2 days.  Has been the bedside.  They both are in agreement with the discharge plan today.  Brief/Interim Summary: This is a 72 year old woman female with PMHx of gout, HLD, HTN, CAD, osteoarthritis, PFO, and h/o CVA scented to ED with a complaint of abdominal pain and poor appetite.  No other complaint..  She was on antibiotics sometime within the last 3 months for UTI.  Patient's last colonoscopy was 10/03/2021, polyps only  noted.  Last EGD 04/10/2022 showed this is a large-caliber ringlike structure and multiple gastric polyps. Her husband brought her in for ongoing diarrhea.   In the ER patient had occult stool negative.  CTA abdomen and pelvis shows a small RLL PE, mild colitis.  Labs essentially normal.  She was sent to hospital service.  Details below.   Small right lower lobe acute PE: Asymptomatic.  Was started on Lovenox and then transition to Eliquis.  Had a lengthy discussion with the patient and her husband and explained about the benefits and risks of anticoagulation including but not limited to life-threatening bleeding and death.  They verbalized understanding and agreed with the plan of continuing anticoagulation.  She understands that she has to take anticoagulation for at least 6 months since this is her first thromboembolism episode.  Further recommendations per PCP.   Diarrhea/acute colitis: CT abdomen shows acute colitis.  Patient has no had any bowel movement since admission.  She was started on Rocephin and Flagyl.  Doing well, has mild left lower quadrant tenderness.  She is being discharged on 7 more days of oral ciprofloxacin and Flagyl.   Weight loss: Reports of greater than 70 pound weight loss over a year but some of them is intentional.   Essential hypertension: Controlled.  Continue home dose of Benicar and Imdur.   Mixed hyperlipidemia: Continue Crestor and fenofibrate.   Sinus bradycardia/obesity: Rates in 40s but patient asymptomatic.  I suspect sleep apnea.  Recommend outpatient sleep study.  Monitor closely.  She is not on any AV nodal blocking agents.   Hypokalemia: Resolved.  Discharge plan was discussed with patient and/or family member and they verbalized understanding and agreed with it.  Discharge Diagnoses:  Principal Problem:   Diarrhea Active Problems:   Mixed hyperlipidemia   GOUT   PFO (patent foramen ovale)   Colitis   Acute pulmonary embolism (HCC)   H/O: CVA  (cerebrovascular accident)   CAD S/P percutaneous coronary angioplasty   HTN (hypertension), benign    Discharge Instructions   Allergies as of 01/15/2023       Reactions   Other Other (See Comments)   Some BANDAIDS cause skin irritation    Penicillins Itching   Tolerated Zosyn couse 10/04/22        Medication List     STOP taking these medications    aspirin EC 81 MG tablet       TAKE these medications    Accu-Chek Aviva Plus test strip Generic drug: glucose blood USE AS INSTRUCTED ONCE A DAY. DX CODE E11.9   Accu-Chek Softclix Lancets lancets Use as instructed once a day.  DX Code: E11.9   acetaminophen 500 MG tablet Commonly known as: TYLENOL Take 1,000 mg by mouth every 6 (six) hours as needed for mild pain or moderate pain.   allopurinol 300 MG tablet Commonly known as: ZYLOPRIM TAKE 1 TABLET BY MOUTH TWICE A DAY   Apixaban Starter Pack (10mg  and 5mg ) Commonly known as: ELIQUIS STARTER PACK Take as directed on package: start with two-5mg  tablets twice daily for 7 days. On day 8, switch to one-5mg  tablet twice daily.   CALCIUM PO Take 2 tablets by mouth daily.   ciprofloxacin 500 MG tablet Commonly known as: Cipro Take 1 tablet (500 mg total) by mouth 2 (two) times daily for 7 days.   famotidine 10 MG tablet Commonly known as: PEPCID Take 10 mg by mouth daily as needed for heartburn or indigestion.   fenofibrate 160 MG tablet Take 1 tablet (160 mg total) by mouth daily.   ferrous sulfate 325 (65 FE) MG tablet TAKE 1 TABLET BY MOUTH TWICE A DAY What changed: when to take this   gabapentin 300 MG capsule Commonly known as: NEURONTIN Take 1 capsule (300 mg total) by mouth at bedtime.   isosorbide mononitrate 60 MG 24 hr tablet Commonly known as: IMDUR Take 1 tablet (60 mg total) by mouth daily.   Linzess 72 MCG capsule Generic drug: linaclotide TAKE 1 CAPSULE BY MOUTH DAILY BEFORE BREAKFAST. What changed: See the new instructions.    metroNIDAZOLE 500 MG tablet Commonly known  as: Flagyl Take 1 tablet (500 mg total) by mouth 3 (three) times daily for 7 days.   olmesartan 5 MG tablet Commonly known as: BENICAR Take 1 tablet (5 mg total) by mouth daily.   rosuvastatin 40 MG tablet Commonly known as: CRESTOR TAKE 1 TABLET BY MOUTH EVERY DAY   Udderly Smooth Crea Apply 1 application topically See admin instructions. Apply topically to skin folds as needed for rash/irritation        Follow-up Information     Saralyn Pilar A, PA Follow up in 1 week(s).   Specialty: Family Medicine Contact information: [address] [place] [address] [phone]                Allergies  Allergen Reactions   Other Other (See Comments)    Some BANDAIDS cause skin irritation    Penicillins Itching     Tolerated Zosyn couse 10/04/22    Consultations: None   Procedures/Studies: VAS [place] LOWER EXTREMITY VENOUS (DVT)  Result Date: 01/14/2023  Lower Venous DVT Study Patient Name:  Rachel Black  Date of Exam:   01/14/2023 Medical Rec #: [id number]        Accession #:    [id number] Date of Birth: 27-Feb-1951        Patient Gender: F Patient Age:   45 years Exam Location:  [facility] Procedure:      VAS [place] LOWER EXTREMITY VENOUS (DVT) Referring Phys: DEBBY CROSLEY --------------------------------------------------------------------------------  Indications: Pain, and Swelling.  Risk Factors: Confirmed PE History of bilateral knee replacements. Comparison Study: No priors. Performing Technologist: Marilynne Halsted RDMS, RVT  Examination Guidelines: A complete evaluation includes B-mode imaging, spectral Doppler, color Doppler, and power Doppler as needed of all accessible portions of each vessel. Bilateral testing is considered an integral part of a complete examination. Limited examinations for reoccurring indications may be performed as noted. The reflux portion of the exam is performed with the patient in  reverse Trendelenburg.  +---------+---------------+---------+-----------+----------+--------------+ RIGHT    CompressibilityPhasicitySpontaneityPropertiesThrombus Aging +---------+---------------+---------+-----------+----------+--------------+ CFV      Full           Yes      Yes                                 +---------+---------------+---------+-----------+----------+--------------+ SFJ      Full                                                        +---------+---------------+---------+-----------+----------+--------------+ FV Prox  Full                                                        +---------+---------------+---------+-----------+----------+--------------+ FV Mid   Full                                                        +---------+---------------+---------+-----------+----------+--------------+ FV DistalFull                                                        +---------+---------------+---------+-----------+----------+--------------+  PFV      Full                                                        +---------+---------------+---------+-----------+----------+--------------+ POP      Full           Yes      Yes                                 +---------+---------------+---------+-----------+----------+--------------+ PTV      Full                                                        +---------+---------------+---------+-----------+----------+--------------+ PERO     Full                                                        +---------+---------------+---------+-----------+----------+--------------+   +---------+---------------+---------+-----------+----------+--------------+ LEFT     CompressibilityPhasicitySpontaneityPropertiesThrombus Aging +---------+---------------+---------+-----------+----------+--------------+ CFV      Full           Yes      Yes                                  +---------+---------------+---------+-----------+----------+--------------+ SFJ      Full                                                        +---------+---------------+---------+-----------+----------+--------------+ FV Prox  Full                                                        +---------+---------------+---------+-----------+----------+--------------+ FV Mid   Full                                                        +---------+---------------+---------+-----------+----------+--------------+ FV DistalFull                                                        +---------+---------------+---------+-----------+----------+--------------+ PFV      Full                                                        +---------+---------------+---------+-----------+----------+--------------+   POP      Full           Yes      Yes                                 +---------+---------------+---------+-----------+----------+--------------+ PTV      Full                                                        +---------+---------------+---------+-----------+----------+--------------+ PERO     Full                                                        +---------+---------------+---------+-----------+----------+--------------+     Summary: BILATERAL: - No evidence of deep vein thrombosis seen in the lower extremities, bilaterally. -No evidence of popliteal cyst, bilaterally.   *See table(s) above for measurements and observations. Electronically signed by Gerarda Fraction on 01/14/2023 at 5:17:20 PM.    Final    CT ABDOMEN PELVIS W CONTRAST  Result Date: 01/13/2023 CLINICAL DATA:  Abdominal pain and diarrhea. EXAM: CT ABDOMEN AND PELVIS WITH CONTRAST TECHNIQUE: Multidetector CT imaging of the abdomen and pelvis was performed using the standard protocol following bolus administration of intravenous contrast. RADIATION DOSE REDUCTION: This exam was performed according to the  departmental dose-optimization program which includes automated exposure control, adjustment of the mA and/or kV according to patient size and/or use of iterative reconstruction technique. CONTRAST:  75mL OMNIPAQUE IOHEXOL 350 MG/ML SOLN COMPARISON:  None Available. FINDINGS: Lower chest: There are findings suspicious for subsegmental right lower lobe pulmonary embolism on image 3/9. Lung bases are otherwise clear. Hepatobiliary: Patient is status post cholecystectomy. Pneumobilia is present. No biliary ductal dilatation. No focal liver lesions. Pancreas: Unremarkable. No pancreatic ductal dilatation or surrounding inflammatory changes. Spleen: Normal in size without focal abnormality. Adrenals/Urinary Tract: Subcentimeter left cortical renal cyst. Otherwise, kidneys, adrenal glands and bladder are within normal limits. Stomach/Bowel: There is sigmoid colon diverticulosis. No focal inflammation identified. There is questionable mild wall thickening of the descending and sigmoid colon. There is no bowel obstruction, pneumatosis or free air. The appendix is not seen. There is a small hiatal hernia. Otherwise, stomach and small bowel are within normal limits. Vascular/Lymphatic: No significant vascular findings are present. No enlarged abdominal or pelvic lymph nodes. Reproductive: Status post hysterectomy. No adnexal masses. Other: There is scarring in the anterior abdominal wall. No evidence for hernia or ascites. Musculoskeletal: There are degenerative changes at L3-L4. IMPRESSION: 1. Subsegmental right lower lobe pulmonary embolism. 2. Questionable mild wall thickening of the descending and sigmoid colon which may represent mild colitis. 3. Sigmoid colon diverticulosis without evidence for acute diverticulitis. 4. Cholecystectomy with pneumobilia. 5. Small hiatal hernia. 6. Subcentimeter left Bosniak I benign renal cyst. No follow-up imaging is recommended. JACR 2018 Feb; 264-273, Management of the Incidental Renal  Mass on CT, [employer] 2021; 814-848, Bosniak Classification of Cystic Renal Masses, Version 2019. These results were called by telephone at the time of interpretation on 01/13/2023 at 11:56 pm to provider Vivien Rossetti , who verbally acknowledged these results. Electronically Signed   By:  Darliss Cheney M.D.   On: 01/13/2023 23:56   DG Knee 2 Views Left  Result Date: 12/26/2022 CLINICAL DATA:  Left knee pain. EXAM: LEFT KNEE - 1-2 VIEW COMPARISON:  November 26, 2022. FINDINGS: Status post left total knee arthroplasty. No fracture or dislocation is noted. IMPRESSION: No acute abnormality seen. Electronically Signed   By: Lupita Raider M.D.   On: 12/26/2022 11:24     Discharge Exam: Vitals:   01/15/23 0353 01/15/23 0743  BP: (!) 142/65 (!) 127/58  Pulse: 61 (!) 58  Resp:  16  Temp: 98 F (36.7 C) 98.2 F (36.8 C)  SpO2: 99% 100%   Vitals:   01/14/23 1746 01/14/23 1928 01/15/23 0353 01/15/23 0743  BP: (!) 142/64 (!) 127/54 (!) 142/65 (!) 127/58  Pulse: (!) 54 66 61 (!) 58  Resp: 17 18  16   Temp: 98.5 F (36.9 C) 98.6 F (37 C) 98 F (36.7 C) 98.2 F (36.8 C)  TempSrc: Oral Oral Oral Oral  SpO2: 98% 98% 99% 100%  Weight:      Height:        General: Pt is alert, awake, not in acute distress Cardiovascular: RRR, S1/S2 +, no rubs, no gallops Respiratory: CTA bilaterally, no wheezing, no rhonchi Abdominal: Soft, mild left lower quadrant tenderness, ND, bowel sounds + Extremities: no edema, no cyanosis    The results of significant diagnostics from this hospitalization (including imaging, microbiology, ancillary and laboratory) are listed below for reference.     Microbiology: No results found for this or any previous visit (from the past 240 hour(s)).   Labs: BNP (last 3 results) No results for input(s): "BNP" in the last 8760 hours. Basic Metabolic Panel: Recent Labs  Lab 01/13/23 1731 01/14/23 0511 01/15/23 0434  NA 139 138 140  K 3.8 3.2* 4.3  CL 108 107 108   CO2 23 27 22   GLUCOSE 103* 94 91  BUN 19 12 12   CREATININE 0.94 0.86 1.07*  CALCIUM 9.2 9.3 9.2   Liver Function Tests: Recent Labs  Lab 01/13/23 1731  AST 19  ALT 15  ALKPHOS 38  BILITOT 0.5  PROT 5.8*  ALBUMIN 3.6   Recent Labs  Lab 01/13/23 1731  LIPASE 44   No results for input(s): "AMMONIA" in the last 168 hours. CBC: Recent Labs  Lab 01/13/23 1731 01/14/23 0511 01/15/23 0434  WBC 7.2 6.8 5.1  NEUTROABS  --  4.2  --   HGB 11.5* 11.6* 10.9*  HCT 35.2* 33.8* 33.7*  MCV 99.2 96.8 99.1  PLT 201 182 176   Cardiac Enzymes: No results for input(s): "CKTOTAL", "CKMB", "CKMBINDEX", "TROPONINI" in the last 168 hours. BNP: Invalid input(s): "POCBNP" CBG: No results for input(s): "GLUCAP" in the last 168 hours. D-Dimer No results for input(s): "DDIMER" in the last 72 hours. Hgb A1c No results for input(s): "HGBA1C" in the last 72 hours. Lipid Profile No results for input(s): "CHOL", "HDL", "LDLCALC", "TRIG", "CHOLHDL", "LDLDIRECT" in the last 72 hours. Thyroid function studies No results for input(s): "TSH", "T4TOTAL", "T3FREE", "THYROIDAB" in the last 72 hours.  Invalid input(s): "FREET3" Anemia work up No results for input(s): "VITAMINB12", "FOLATE", "FERRITIN", "TIBC", "IRON", "RETICCTPCT" in the last 72 hours. Urinalysis    Component Value Date/Time   COLORURINE YELLOW 01/13/2023 1731   APPEARANCEUR HAZY (A) 01/13/2023 1731   LABSPEC 1.025 01/13/2023 1731   PHURINE 5.0 01/13/2023 1731   GLUCOSEU NEGATIVE 01/13/2023 1731   HGBUR NEGATIVE 01/13/2023 1731   BILIRUBINUR  NEGATIVE 01/13/2023 1731   BILIRUBINUR neg 12/09/2015 1701   KETONESUR NEGATIVE 01/13/2023 1731   PROTEINUR NEGATIVE 01/13/2023 1731   UROBILINOGEN 0.2 12/09/2015 1701   UROBILINOGEN 0.2 04/13/2014 1422   NITRITE POSITIVE (A) 01/13/2023 1731   LEUKOCYTESUR TRACE (A) 01/13/2023 1731   Sepsis Labs Recent Labs  Lab 01/13/23 1731 01/14/23 0511 01/15/23 0434  WBC 7.2 6.8 5.1    Microbiology No results found for this or any previous visit (from the past 240 hour(s)).   Time coordinating discharge: Over 30 minutes  SIGNED:   Hughie Closs, MD  [facility] 01/15/2023, 9:12 AM *Please note that this is a verbal dictation therefore any spelling or grammatical errors are due to the "[employer]agon [employer] One" system interpretation. If 7PM-7AM, please contact night-coverage [email or web address]

## 2023-01-15 NOTE — Progress Notes (Signed)
Pt and her husband provided with discharge instructions with no concerns voiced. TOC meds also given to pt

## 2023-01-17 ENCOUNTER — Encounter (HOSPITAL_COMMUNITY): Payer: Self-pay | Admitting: Emergency Medicine

## 2023-01-17 ENCOUNTER — Emergency Department (HOSPITAL_COMMUNITY)
Admission: EM | Admit: 2023-01-17 | Discharge: 2023-01-18 | Disposition: A | Discharge status: Home / Self Care | Payer: BC Managed Care – PPO | Attending: Emergency Medicine | Admitting: Emergency Medicine

## 2023-01-17 DIAGNOSIS — E119 Type 2 diabetes mellitus without complications: Secondary | ICD-10-CM | POA: Diagnosis not present

## 2023-01-17 DIAGNOSIS — Z7901 Long term (current) use of anticoagulants: Secondary | ICD-10-CM | POA: Insufficient documentation

## 2023-01-17 DIAGNOSIS — R109 Unspecified abdominal pain: Secondary | ICD-10-CM | POA: Insufficient documentation

## 2023-01-17 DIAGNOSIS — I10 Essential (primary) hypertension: Secondary | ICD-10-CM | POA: Diagnosis not present

## 2023-01-17 DIAGNOSIS — Z79899 Other long term (current) drug therapy: Secondary | ICD-10-CM | POA: Diagnosis not present

## 2023-01-17 LAB — COMPREHENSIVE METABOLIC PANEL
ALT: 19 U/L (ref 0–44)
AST: 26 U/L (ref 15–41)
Albumin: 3.7 g/dL (ref 3.5–5.0)
Alkaline Phosphatase: 38 U/L (ref 38–126)
Anion gap: 7 (ref 5–15)
BUN: 20 mg/dL (ref 8–23)
CO2: 22 mmol/L (ref 22–32)
Calcium: 9.6 mg/dL (ref 8.9–10.3)
Chloride: 107 mmol/L (ref 98–111)
Creatinine, Ser: 1.1 mg/dL — ABNORMAL HIGH (ref 0.44–1.00)
GFR, Estimated: 54 mL/min — ABNORMAL LOW (ref >60)
Glucose, Bld: 105 mg/dL — ABNORMAL HIGH (ref 70–99)
Potassium: 4 mmol/L (ref 3.5–5.1)
Sodium: 136 mmol/L (ref 135–145)
Total Bilirubin: 0.4 mg/dL (ref 0.3–1.2)
Total Protein: 5.8 g/dL — ABNORMAL LOW (ref 6.5–8.1)

## 2023-01-17 LAB — CBC
HCT: 36.1 % (ref 36.0–46.0)
Hemoglobin: 12 g/dL (ref 12.0–15.0)
MCH: 32.5 pg (ref 26.0–34.0)
MCHC: 33.2 g/dL (ref 30.0–36.0)
MCV: 97.8 fL (ref 80.0–100.0)
Platelets: 187 10*3/uL (ref 150–400)
RBC: 3.69 MIL/uL — ABNORMAL LOW (ref 3.87–5.11)
RDW: 13.8 % (ref 11.5–15.5)
WBC: 5.4 10*3/uL (ref 4.0–10.5)
nRBC: 0 % (ref 0.0–0.2)

## 2023-01-17 LAB — LIPASE, BLOOD: Lipase: 46 U/L (ref 11–51)

## 2023-01-17 NOTE — ED Triage Notes (Signed)
Pt here from home with continued abd pain and diarrhea , pt discharged Friday afternoon , was sent home on antibiotics ,

## 2023-01-18 ENCOUNTER — Other Ambulatory Visit: Payer: Self-pay

## 2023-01-18 MED ORDER — DICYCLOMINE HCL 20 MG PO TABS: 20.0000 mg | ORAL_TABLET | Freq: Two times a day (BID) | ORAL | 0 refills | Status: DC | PRN

## 2023-01-18 MED ORDER — APIXABAN 5 MG PO TABS: 10.0000 mg | ORAL_TABLET | Freq: Two times a day (BID) | ORAL | Status: DC

## 2023-01-18 MED ORDER — METOCLOPRAMIDE HCL 10 MG PO TABS
10.0000 mg | ORAL_TABLET | Freq: Once | ORAL | Status: AC
  Administered 2023-01-18: 10 mg via ORAL
  Filled 2023-01-18: qty 1

## 2023-01-18 MED ORDER — APIXABAN 5 MG PO TABS: 5.0000 mg | ORAL_TABLET | Freq: Two times a day (BID) | ORAL | Status: DC

## 2023-01-18 MED ORDER — SODIUM CHLORIDE 0.9 % IV BOLUS
500.0000 mL | Freq: Once | INTRAVENOUS | Status: AC
  Administered 2023-01-18: 500 mL via INTRAVENOUS

## 2023-01-18 MED ORDER — METRONIDAZOLE 500 MG PO TABS
500.0000 mg | ORAL_TABLET | Freq: Three times a day (TID) | ORAL | Status: DC
  Administered 2023-01-18: 500 mg via ORAL
  Filled 2023-01-18: qty 1

## 2023-01-18 MED ORDER — CIPROFLOXACIN HCL 500 MG PO TABS
500.0000 mg | ORAL_TABLET | Freq: Two times a day (BID) | ORAL | Status: DC
  Administered 2023-01-18: 500 mg via ORAL
  Filled 2023-01-18: qty 1

## 2023-01-18 MED ORDER — DICYCLOMINE HCL 10 MG PO CAPS
10.0000 mg | ORAL_CAPSULE | Freq: Once | ORAL | Status: AC
  Administered 2023-01-18: 10 mg via ORAL
  Filled 2023-01-18: qty 1

## 2023-01-18 NOTE — ED Notes (Signed)
Patient provided with ER meal per patients request.

## 2023-01-18 NOTE — Discharge Instructions (Addendum)
Continue your ciprofloxacin and Flagyl as prescribed.  He may benefit from taking a daily probiotic which can be purchased over-the-counter at your local pharmacy.  This can help to relieve diarrhea.  We have prescribed you a short course of Bentyl to use for management of abdominal cramping/pain.  You may also continue to use Tylenol as needed.  Follow-up with your primary care doctor as well as your gastroenterologist.  Return to the ED for new or concerning symptoms.

## 2023-01-18 NOTE — ED Provider Notes (Signed)
[place] EMERGENCY DEPARTMENT AT [facility] Provider Note   CSN: [id number] Arrival date & time: 01/17/23  1849     History  Chief Complaint  Patient presents with   Abdominal Pain    Rachel Black is a 72 y.o. female.  72 year old female presents to the emergency department for evaluation of abdominal pain.  States that her abdominal pain has been persistent and waxing and waning in severity.  She does have a degree of chronic abdominal pain at times.  Was admitted from 01/13/2023 to 01/15/2023 for presumed mild colitis seen on CT scan.  Also was found to have incidental subsegmental pulmonary embolus.  Has been compliant with her Eliquis and continues to take ciprofloxacin and Flagyl for management of her colitis.  She denies any fevers since time of discharge.  No vomiting, melena, hematochezia.  GI - Dr. Marina Goodell  The history is provided by the patient. No language interpreter was used.  Abdominal Pain      Home Medications Prior to Admission medications   Medication Sig Start Date End Date Taking? Authorizing Provider  dicyclomine (BENTYL) 20 MG tablet Take 1 tablet (20 mg total) by mouth every 12 (twelve) hours as needed (for abdominal pain/cramping). 01/18/23  Yes Antony Madura, PA-C  ACCU-CHEK AVIVA PLUS test strip USE AS INSTRUCTED ONCE A DAY. DX CODE E11.9 07/20/22   Mayer Masker, PA-C  Accu-Chek Softclix Lancets lancets Use as instructed once a day.  DX Code: E11.9 03/24/22   Mayer Masker, PA-C  acetaminophen (TYLENOL) 500 MG tablet Take 1,000 mg by mouth every 6 (six) hours as needed for mild pain or moderate pain.    [provider]  allopurinol (ZYLOPRIM) 300 MG tablet TAKE 1 TABLET BY MOUTH TWICE A DAY 08/27/22   Carlean Jews, NP  APIXABAN (ELIQUIS) VTE STARTER PACK (10MG  AND 5MG ) Take as directed on package: start with two-5mg  tablets twice daily for 7 days. On day 8, switch to one-5mg  tablet twice daily. 01/15/23   Hughie Closs, MD  CALCIUM PO  Take 2 tablets by mouth daily.    [provider]  ciprofloxacin (CIPRO) 500 MG tablet Take 1 tablet (500 mg total) by mouth 2 (two) times daily for 7 days. 01/15/23 01/22/23  Hughie Closs, MD  Emollient (UDDERLY SMOOTH) CREA Apply 1 application topically See admin instructions. Apply topically to skin folds as needed for rash/irritation    [provider]  famotidine (PEPCID) 10 MG tablet Take 10 mg by mouth daily as needed for heartburn or indigestion.    [provider]  fenofibrate 160 MG tablet Take 1 tablet (160 mg total) by mouth daily. 09/10/22   Carlean Jews, NP  ferrous sulfate 325 (65 FE) MG tablet TAKE 1 TABLET BY MOUTH TWICE A DAY Patient taking differently: Take 325 mg by mouth daily. 08/03/22   Zehr, Princella Pellegrini, PA-C  gabapentin (NEURONTIN) 300 MG capsule Take 1 capsule (300 mg total) by mouth at bedtime. 12/21/22   Kathryne Hitch, MD  isosorbide mononitrate (IMDUR) 60 MG 24 hr tablet Take 1 tablet (60 mg total) by mouth daily. 03/09/22   Rollene Rotunda, MD  LINZESS 72 MCG capsule TAKE 1 CAPSULE BY MOUTH DAILY BEFORE BREAKFAST. Patient taking differently: Take 72 mcg by mouth daily before breakfast. 12/15/22   Saralyn Pilar A, PA  metroNIDAZOLE (FLAGYL) 500 MG tablet Take 1 tablet (500 mg total) by mouth 3 (three) times daily for 7 days. 01/15/23 01/22/23  Hughie Closs, MD  olmesartan (BENICAR) 5 MG tablet Take 1 tablet (5 mg total) by mouth daily. 10/23/22   Carlean Jews, NP  rosuvastatin (CRESTOR) 40 MG tablet TAKE 1 TABLET BY MOUTH EVERY DAY 01/04/23   Rollene Rotunda, MD      Allergies    Other and Penicillins    Review of Systems   Review of Systems  Gastrointestinal:  Positive for abdominal pain.  Ten systems reviewed and are negative for acute change, except as noted in the HPI.    Physical Exam Updated Vital Signs BP (!) 154/55   Pulse (!) 50   Temp 98 F (36.7 C)   Resp 16   SpO2 98%   Physical Exam Vitals and nursing note  reviewed.  Constitutional:      General: She is not in acute distress.    Appearance: She is well-developed. She is not diaphoretic.     Comments: Nontoxic appearing and in NAD  HENT:     Head: Normocephalic and atraumatic.  Eyes:     General: No scleral icterus.    Conjunctiva/sclera: Conjunctivae normal.  Cardiovascular:     Rate and Rhythm: Normal rate and regular rhythm.     Pulses: Normal pulses.  Pulmonary:     Effort: Pulmonary effort is normal. No respiratory distress.     Breath sounds: No stridor. No wheezing.     Comments: Respirations even and unlabored Abdominal:     Palpations: Abdomen is soft. There is no mass.     Tenderness: There is no abdominal tenderness.     Comments: Abdomen soft, nondistended and obese.  There is no focal tenderness on exam.  No peritoneal signs or guarding.  Musculoskeletal:        General: Normal range of motion.     Cervical back: Normal range of motion.  Skin:    General: Skin is warm and dry.     Coloration: Skin is not pale.     Findings: No erythema or rash.  Neurological:     Mental Status: She is alert and oriented to person, place, and time.  Psychiatric:        Behavior: Behavior normal.     ED Results / Procedures / Treatments   Labs (all labs ordered are listed, but only abnormal results are displayed) Labs Reviewed  COMPREHENSIVE METABOLIC PANEL - Abnormal; Notable for the following components:      Result Value   Glucose, Bld 105 (*)    Creatinine, Ser 1.10 (*)    Total Protein 5.8 (*)    GFR, Estimated 54 (*)    All other components within normal limits  CBC - Abnormal; Notable for the following components:   RBC 3.69 (*)    All other components within normal limits  LIPASE, BLOOD    EKG None  Radiology No results found.  Procedures Procedures    Medications Ordered in ED Medications  ciprofloxacin (CIPRO) tablet 500 mg (500 mg Oral Given 01/18/23 0424)  metroNIDAZOLE (FLAGYL) tablet 500 mg (500 mg  Oral Given 01/18/23 0424)  apixaban (ELIQUIS) tablet 10 mg (has no administration in time range)    Followed by  apixaban (ELIQUIS) tablet 5 mg (has no administration in time range)  dicyclomine (BENTYL) capsule 10 mg (10 mg Oral Given 01/18/23 0424)  metoCLOPramide (REGLAN) tablet 10 mg (10 mg Oral Given 01/18/23 0424)  sodium chloride 0.9 % bolus 500 mL (0 mLs Intravenous Stopped 01/18/23 0454)    ED Course/ Medical Decision Making/ A&P  Medical Decision Making Amount and/or Complexity of Data Reviewed Labs: ordered.  Risk Prescription drug management.   This patient presents to the ED for concern of abdominal pain, this involves an extensive number of treatment options, and is a complaint that carries with it a high risk of complications and morbidity.  The differential diagnosis includes colitis vs pSBO vs SBO vs ruptured viscous vs intraabdominal abscess   Co morbidities that complicate the patient evaluation  HTN HLD DM Recurrent abdominal pain   Additional history obtained:  Additional history obtained from spouse, at bedside External records from outside source obtained and reviewed including CT from 01/13/23 which showed subsegmental right lower lobe PE as well as questionable wall thickening of the descending and sigmoid colon felt to represent mild colitis   Lab Tests:  I Ordered, and personally interpreted labs.  The pertinent results include:  WBC 5.4, creatinine 1.10 (stable), lipase WNL   Cardiac Monitoring:  The patient was maintained on a cardiac monitor.  I personally viewed and interpreted the cardiac monitored which showed an underlying rhythm of: NSR   Medicines ordered and prescription drug management:  I ordered medication including Bentyl and Reglan for abdominal pain, Ciprofloxacin and Flagyl to maintain current abx regiment, Eliquis for compliance with anticoagulant  Reevaluation of the patient after these medicines showed  that the patient improved I have reviewed the patients home medicines and have made adjustments as needed   Test Considered:  Repeat CT - however patient is afebrile with no developing white count and a fairly nontender abdominal exam.  Low suspicion for emergent process.   Problem List / ED Course:  Patient presenting for persistent abdominal pain.  Recently diagnosed with mild colitis.  Has been on a course of ciprofloxacin and Flagyl.  This has been continued while in the ED tonight. While patient complains of ongoing pain, her abdominal exam is nontender and benign.  Her laboratory evaluation is stable compared to hospital discharge a few days ago.  I have low suspicion for acute complications associated with her colitis such as perforation or abscess.  She is afebrile in the department.  No criteria for SIRS/sepsis. Per chart review, patient also has presented a number of times in the past for recurrent abdominal pain complaints.  I believe that her pain this evening is related to a chronic issue rather than her mild colitis. Plan for discharge on course of Bentyl that patient can use as needed for severe pain.  Otherwise, can continue Tylenol.  Encouraged follow-up with her gastroenterologist.   Reevaluation:  After the interventions noted above, I reevaluated the patient and found that they have : remained stable   Social Determinants of Health:  Insured patient   Dispostion:  After consideration of the diagnostic results and the patients response to treatment, I feel that the patent would benefit from primary care follow-up as well as gastroenterology follow-up.  Informed of need to complete course of antibiotics.  Have recommended addition of a probiotic to help relieve diarrhea.  Return precautions discussed and provided. Patient discharged in stable condition with no unaddressed concerns.          Final Clinical Impression(s) / ED Diagnoses Final diagnoses:   Abdominal pain, unspecified abdominal location    Rx / DC Orders ED Discharge Orders          Ordered    dicyclomine (BENTYL) 20 MG tablet  Every 12 hours PRN        01/18/23 0508  Antony Madura, PA-C 01/18/23 1308    Dione Booze, MD 01/18/23 [id number]

## 2023-01-19 ENCOUNTER — Encounter: Payer: BC Managed Care – PPO | Admitting: Physical Therapy

## 2023-01-21 ENCOUNTER — Encounter: Payer: BC Managed Care – PPO | Admitting: Rehabilitative and Restorative Service Providers"

## 2023-01-21 ENCOUNTER — Inpatient Hospital Stay (HOSPITAL_COMMUNITY)
Admission: EM | Admit: 2023-01-21 | Discharge: 2023-01-26 | DRG: 392 | Disposition: A | Discharge status: Home / Self Care | Payer: BC Managed Care – PPO | Attending: Internal Medicine | Admitting: Internal Medicine

## 2023-01-21 DIAGNOSIS — Z8249 Family history of ischemic heart disease and other diseases of the circulatory system: Secondary | ICD-10-CM

## 2023-01-21 DIAGNOSIS — Z9049 Acquired absence of other specified parts of digestive tract: Secondary | ICD-10-CM

## 2023-01-21 DIAGNOSIS — Z79899 Other long term (current) drug therapy: Secondary | ICD-10-CM

## 2023-01-21 DIAGNOSIS — Z86711 Personal history of pulmonary embolism: Secondary | ICD-10-CM

## 2023-01-21 DIAGNOSIS — K219 Gastro-esophageal reflux disease without esophagitis: Secondary | ICD-10-CM | POA: Diagnosis present

## 2023-01-21 DIAGNOSIS — E876 Hypokalemia: Secondary | ICD-10-CM | POA: Diagnosis not present

## 2023-01-21 DIAGNOSIS — E785 Hyperlipidemia, unspecified: Secondary | ICD-10-CM | POA: Diagnosis present

## 2023-01-21 DIAGNOSIS — Z90711 Acquired absence of uterus with remaining cervical stump: Secondary | ICD-10-CM

## 2023-01-21 DIAGNOSIS — K529 Noninfective gastroenteritis and colitis, unspecified: Secondary | ICD-10-CM | POA: Diagnosis present

## 2023-01-21 DIAGNOSIS — Z83438 Family history of other disorder of lipoprotein metabolism and other lipidemia: Secondary | ICD-10-CM

## 2023-01-21 DIAGNOSIS — I10 Essential (primary) hypertension: Secondary | ICD-10-CM | POA: Diagnosis present

## 2023-01-21 DIAGNOSIS — K5732 Diverticulitis of large intestine without perforation or abscess without bleeding: Principal | ICD-10-CM | POA: Diagnosis present

## 2023-01-21 DIAGNOSIS — Z7901 Long term (current) use of anticoagulants: Secondary | ICD-10-CM

## 2023-01-21 DIAGNOSIS — E119 Type 2 diabetes mellitus without complications: Secondary | ICD-10-CM | POA: Diagnosis present

## 2023-01-21 DIAGNOSIS — Z880 Allergy status to penicillin: Secondary | ICD-10-CM

## 2023-01-21 DIAGNOSIS — I2510 Atherosclerotic heart disease of native coronary artery without angina pectoris: Secondary | ICD-10-CM | POA: Diagnosis present

## 2023-01-21 DIAGNOSIS — Z803 Family history of malignant neoplasm of breast: Secondary | ICD-10-CM

## 2023-01-21 DIAGNOSIS — I252 Old myocardial infarction: Secondary | ICD-10-CM

## 2023-01-21 DIAGNOSIS — Z8673 Personal history of transient ischemic attack (TIA), and cerebral infarction without residual deficits: Secondary | ICD-10-CM

## 2023-01-21 DIAGNOSIS — Z833 Family history of diabetes mellitus: Secondary | ICD-10-CM

## 2023-01-21 DIAGNOSIS — K5792 Diverticulitis of intestine, part unspecified, without perforation or abscess without bleeding: Secondary | ICD-10-CM | POA: Diagnosis present

## 2023-01-21 DIAGNOSIS — Z96653 Presence of artificial knee joint, bilateral: Secondary | ICD-10-CM | POA: Diagnosis present

## 2023-01-21 DIAGNOSIS — E669 Obesity, unspecified: Secondary | ICD-10-CM | POA: Diagnosis present

## 2023-01-21 DIAGNOSIS — M109 Gout, unspecified: Secondary | ICD-10-CM | POA: Diagnosis present

## 2023-01-21 DIAGNOSIS — Q2112 Patent foramen ovale: Secondary | ICD-10-CM

## 2023-01-21 DIAGNOSIS — Z823 Family history of stroke: Secondary | ICD-10-CM

## 2023-01-21 DIAGNOSIS — Z6828 Body mass index (BMI) 28.0-28.9, adult: Secondary | ICD-10-CM

## 2023-01-21 DIAGNOSIS — R109 Unspecified abdominal pain: Secondary | ICD-10-CM | POA: Diagnosis not present

## 2023-01-21 LAB — URINALYSIS, ROUTINE W REFLEX MICROSCOPIC
Bacteria, UA: NONE SEEN
Bilirubin Urine: NEGATIVE
Glucose, UA: NEGATIVE mg/dL
Hgb urine dipstick: NEGATIVE
Ketones, ur: NEGATIVE mg/dL
Nitrite: NEGATIVE
Protein, ur: NEGATIVE mg/dL
Specific Gravity, Urine: 1.015 (ref 1.005–1.030)
pH: 5 (ref 5.0–8.0)

## 2023-01-21 LAB — CBC
HCT: 39 % (ref 36.0–46.0)
Hemoglobin: 12.4 g/dL (ref 12.0–15.0)
MCH: 32 pg (ref 26.0–34.0)
MCHC: 31.8 g/dL (ref 30.0–36.0)
MCV: 100.5 fL — ABNORMAL HIGH (ref 80.0–100.0)
Platelets: 190 10*3/uL (ref 150–400)
RBC: 3.88 MIL/uL (ref 3.87–5.11)
RDW: 13.6 % (ref 11.5–15.5)
WBC: 5.3 10*3/uL (ref 4.0–10.5)
nRBC: 0 % (ref 0.0–0.2)

## 2023-01-21 LAB — COMPREHENSIVE METABOLIC PANEL
ALT: 18 U/L (ref 0–44)
AST: 23 U/L (ref 15–41)
Albumin: 3.9 g/dL (ref 3.5–5.0)
Alkaline Phosphatase: 34 U/L — ABNORMAL LOW (ref 38–126)
Anion gap: 11 (ref 5–15)
BUN: 21 mg/dL (ref 8–23)
CO2: 21 mmol/L — ABNORMAL LOW (ref 22–32)
Calcium: 9.2 mg/dL (ref 8.9–10.3)
Chloride: 105 mmol/L (ref 98–111)
Creatinine, Ser: 1.06 mg/dL — ABNORMAL HIGH (ref 0.44–1.00)
GFR, Estimated: 57 mL/min — ABNORMAL LOW (ref >60)
Glucose, Bld: 102 mg/dL — ABNORMAL HIGH (ref 70–99)
Potassium: 4.4 mmol/L (ref 3.5–5.1)
Sodium: 137 mmol/L (ref 135–145)
Total Bilirubin: 0.6 mg/dL (ref 0.3–1.2)
Total Protein: 6 g/dL — ABNORMAL LOW (ref 6.5–8.1)

## 2023-01-21 LAB — LIPASE, BLOOD: Lipase: 56 U/L — ABNORMAL HIGH (ref 11–51)

## 2023-01-21 NOTE — ED Provider Notes (Signed)
[place] EMERGENCY DEPARTMENT AT [facility] Provider Note   CSN: [id number] Arrival date & time: 01/21/23  1743     History  Chief Complaint  Patient presents with   Abdominal Pain   Diarrhea    Rachel Black is a 72 y.o. female.  The history is provided by the patient and medical records.  Abdominal Pain Associated symptoms: diarrhea   Diarrhea Associated symptoms: abdominal pain    72 y.o. F with hx of DM, prior CVA, gout, GERD, HTN, obesity, recent diagnosis of PE and colitis 01/13/23 here with continued abdominal pain and loose stools.  After hospital discharge, seen again in the ED 01/18/23 and discharged with bentyl but reports continued loose stools and abdominal pain.  States it feels very "crampy" but at times sharp in her lower abdomen.  She reports some nausea but denies vomiting.  Continues having loose stools, 3-5x daily and mostly in the morning.  States stool now looks "black".  She denies any frank blood in her stools.  Husband reports some frustration as she has been having GI issues for "months".  She is followed by GI-- Dr. Marina Goodell.  Home Medications Prior to Admission medications   Medication Sig Start Date End Date Taking? Authorizing Provider  ACCU-CHEK AVIVA PLUS test strip USE AS INSTRUCTED ONCE A DAY. DX CODE E11.9 07/20/22   Mayer Masker, PA-C  Accu-Chek Softclix Lancets lancets Use as instructed once a day.  DX Code: E11.9 03/24/22   Mayer Masker, PA-C  acetaminophen (TYLENOL) 500 MG tablet Take 1,000 mg by mouth every 6 (six) hours as needed for mild pain or moderate pain.    [provider]  allopurinol (ZYLOPRIM) 300 MG tablet TAKE 1 TABLET BY MOUTH TWICE A DAY 08/27/22   Carlean Jews, NP  APIXABAN Everlene Balls) VTE STARTER PACK (10MG  AND 5MG ) Take as directed on package: start with two-5mg  tablets twice daily for 7 days. On day 8, switch to one-5mg  tablet twice daily. 01/15/23   Hughie Closs, MD  CALCIUM PO Take 2 tablets by mouth  daily.    [provider]  ciprofloxacin (CIPRO) 500 MG tablet Take 1 tablet (500 mg total) by mouth 2 (two) times daily for 7 days. 01/15/23 01/22/23  Hughie Closs, MD  dicyclomine (BENTYL) 20 MG tablet Take 1 tablet (20 mg total) by mouth every 12 (twelve) hours as needed (for abdominal pain/cramping). 01/18/23   Antony Madura, PA-C  Emollient (UDDERLY SMOOTH) CREA Apply 1 application topically See admin instructions. Apply topically to skin folds as needed for rash/irritation    [provider]  famotidine (PEPCID) 10 MG tablet Take 10 mg by mouth daily as needed for heartburn or indigestion.    [provider]  fenofibrate 160 MG tablet Take 1 tablet (160 mg total) by mouth daily. 09/10/22   Carlean Jews, NP  ferrous sulfate 325 (65 FE) MG tablet TAKE 1 TABLET BY MOUTH TWICE A DAY Patient taking differently: Take 325 mg by mouth daily. 08/03/22   Zehr, Princella Pellegrini, PA-C  gabapentin (NEURONTIN) 300 MG capsule Take 1 capsule (300 mg total) by mouth at bedtime. 12/21/22   Kathryne Hitch, MD  isosorbide mononitrate (IMDUR) 60 MG 24 hr tablet Take 1 tablet (60 mg total) by mouth daily. 03/09/22   Rollene Rotunda, MD  LINZESS 72 MCG capsule TAKE 1 CAPSULE BY MOUTH DAILY BEFORE BREAKFAST. Patient taking differently: Take 72 mcg by mouth daily before breakfast. 12/15/22   Melida Quitter,  PA  metroNIDAZOLE (FLAGYL) 500 MG tablet Take 1 tablet (500 mg total) by mouth 3 (three) times daily for 7 days. 01/15/23 01/22/23  Hughie Closs, MD  olmesartan (BENICAR) 5 MG tablet Take 1 tablet (5 mg total) by mouth daily. 10/23/22   Carlean Jews, NP  rosuvastatin (CRESTOR) 40 MG tablet TAKE 1 TABLET BY MOUTH EVERY DAY 01/04/23   Rollene Rotunda, MD      Allergies    Other and Penicillins    Review of Systems   Review of Systems  Gastrointestinal:  Positive for abdominal pain and diarrhea.  All other systems reviewed and are negative.   Physical Exam Updated Vital Signs BP  115/79 (BP Location: Right Arm)   Pulse 65   Temp 98.9 F (37.2 C) (Oral)   Resp 16   SpO2 100%   Physical Exam Vitals and nursing note reviewed.  Constitutional:      Appearance: She is well-developed.  HENT:     Head: Normocephalic and atraumatic.  Eyes:     Conjunctiva/sclera: Conjunctivae normal.     Pupils: Pupils are equal, round, and reactive to light.  Cardiovascular:     Rate and Rhythm: Normal rate and regular rhythm.     Heart sounds: Normal heart sounds.  Pulmonary:     Effort: Pulmonary effort is normal.     Breath sounds: Normal breath sounds.  Abdominal:     General: Bowel sounds are normal.     Palpations: Abdomen is soft.     Tenderness: There is no abdominal tenderness. There is no rebound.     Comments: Soft, fairly benign abdomen without peritoneal signs but endorses lower abdominal pain/cramping  Genitourinary:    Comments: Exam chaperoned by RN Normal rectum, no gross blood on DRE, small flecks of light brown stool Musculoskeletal:        General: Normal range of motion.     Cervical back: Normal range of motion.  Skin:    General: Skin is warm and dry.  Neurological:     Mental Status: She is alert and oriented to person, place, and time.     ED Results / Procedures / Treatments   Labs (all labs ordered are listed, but only abnormal results are displayed) Labs Reviewed  LIPASE, BLOOD - Abnormal; Notable for the following components:      Result Value   Lipase 56 (*)    All other components within normal limits  COMPREHENSIVE METABOLIC PANEL - Abnormal; Notable for the following components:   CO2 21 (*)    Glucose, Bld 102 (*)    Creatinine, Ser 1.06 (*)    Total Protein 6.0 (*)    Alkaline Phosphatase 34 (*)    GFR, Estimated 57 (*)    All other components within normal limits  CBC - Abnormal; Notable for the following components:   MCV 100.5 (*)    All other components within normal limits  URINALYSIS, ROUTINE W REFLEX MICROSCOPIC -  Abnormal; Notable for the following components:   Leukocytes,Ua TRACE (*)    All other components within normal limits  C DIFFICILE QUICK SCREEN W PCR REFLEX    POC OCCULT BLOOD, ED    EKG None  Radiology CT ABDOMEN PELVIS W CONTRAST  Result Date: 01/22/2023 CLINICAL DATA:  Left lower quadrant abdominal pain. EXAM: CT ABDOMEN AND PELVIS WITH CONTRAST TECHNIQUE: Multidetector CT imaging of the abdomen and pelvis was performed using the standard protocol following bolus administration of intravenous contrast. RADIATION DOSE  REDUCTION: This exam was performed according to the departmental dose-optimization program which includes automated exposure control, adjustment of the mA and/or kV according to patient size and/or use of iterative reconstruction technique. CONTRAST:  75mL OMNIPAQUE IOHEXOL 350 MG/ML SOLN COMPARISON:  Jan 13, 2023 FINDINGS: Lower chest: No acute abnormality. Hepatobiliary: No focal liver abnormality is seen. A moderate amount of predominant central pneumobilia is seen. This is mildly increased in severity when compared to the prior study. Status post cholecystectomy. No biliary dilatation. Pancreas: Unremarkable. No pancreatic ductal dilatation or surrounding inflammatory changes. Spleen: Normal in size without focal abnormality. Adrenals/Urinary Tract: Adrenal glands are unremarkable. Kidneys are normal in size, without renal calculi or hydronephrosis. An extrarenal pelvis is seen on the right. A stable subcentimeter simple left renal cyst is seen on the left. Bladder is unremarkable. Stomach/Bowel: There is a small hiatal hernia. The appendix is not visualized. No evidence of bowel dilatation. Numerous diverticula are seen throughout the sigmoid colon. Mild thickening of the proximal to mid sigmoid colon is also noted. Vascular/Lymphatic: No significant vascular findings are present. No enlarged abdominal or pelvic lymph nodes. Reproductive: Status post hysterectomy. No adnexal  masses. Other: No abdominal wall hernia or abnormality. No abdominopelvic ascites. Musculoskeletal: Multilevel degenerative changes are seen throughout the lumbar spine. IMPRESSION: 1. Sigmoid diverticulitis with additional findings likely consistent with mild sigmoid colitis. 2. Small hiatal hernia. 3. Evidence of prior cholecystectomy and hysterectomy. Electronically Signed   By: Aram Candela M.D.   On: 01/22/2023 01:33    Procedures Procedures    Medications Ordered in ED Medications - No data to display  ED Course/ Medical Decision Making/ A&P                             Medical Decision Making Amount and/or Complexity of Data Reviewed Radiology: ordered and independent interpretation performed. ECG/medicine tests: ordered and independent interpretation performed.  Risk Prescription drug management. Decision regarding hospitalization.   72 year old female presenting to the ED with abdominal discomfort and continued diarrhea.  She was admitted 01/13/2023 for colitis, has been taking Cipro Flagyl.  She was seen in the ED a few days ago with reassuring labs but did not have repeat CT scan.  She reports symptoms seem to be worsening again.  She endorses pain all across her lower abdomen but does not have any peritoneal signs on exam.  Labs today without leukocytosis or electrolyte derangement.  Normal lipase.  Hemoccult negative (confirmed in lab).  CT today with diverticulitis and mild sigmoid colitis. No findings of abscess or perforation.  Patient has not had had any loose stools, nausea, or vomiting while here in the ER for 8+ hours.  Doubt C. difficile.  She has been compliant with her home cipro/flagyl and now has worsening symptoms.  Seems to be a treatment failure.  She does have penicillin allergy but tolerated zosyn during hospitalization in January 2024 so will re-start this.  Discussed with Dr. Toniann Fail-- will admit for ongoing care.  Final Clinical Impression(s) / ED  Diagnoses Final diagnoses:  Diverticulitis    Rx / DC Orders ED Discharge Orders     None         Garlon Hatchet, PA-C 01/22/23 0335    Glyn Ade, MD 01/22/23 1501

## 2023-01-21 NOTE — ED Provider Triage Note (Signed)
Emergency Medicine Provider Triage Evaluation Note  Rachel Black , a 72 y.o. female  was evaluated in triage.  Pt complains of abdominal pain going on for some time. Reports previous diagnosis of colitis. Continues to have diarrhea and lower abd pain. Is taking medicine that was prescribed to her without relief.   Review of Systems  Positive: Abd pain, diarrhea, fatigue Negative: Fever, CP, SOB  Physical Exam  BP 115/79 (BP Location: Right Arm)   Pulse 65   Temp 98.9 F (37.2 C) (Oral)   Resp 16   SpO2 100%  Gen:   Awake, no distress   Resp:  Normal effort  MSK:   Moves extremities without difficulty  Other:    Medical Decision Making  Medically screening exam initiated at 6:28 PM.  Appropriate orders placed.  [name] was informed that the remainder of the evaluation will be completed by another provider, this initial triage assessment does not replace that evaluation, and the importance of remaining in the ED until their evaluation is complete.  Workup initiated   Su Monks, PA-C 01/21/23 1829

## 2023-01-21 NOTE — ED Triage Notes (Signed)
Pt c/o generalized abd pain with diarrhea ongoing with previous visits for the same issue. Pt describes her diarrhea as "black". Pt endorses some nausea.

## 2023-01-22 ENCOUNTER — Emergency Department (HOSPITAL_COMMUNITY): Payer: BC Managed Care – PPO

## 2023-01-22 ENCOUNTER — Encounter (HOSPITAL_COMMUNITY): Payer: Self-pay | Admitting: Internal Medicine

## 2023-01-22 ENCOUNTER — Inpatient Hospital Stay: Payer: BC Managed Care – PPO | Admitting: Family Medicine

## 2023-01-22 ENCOUNTER — Other Ambulatory Visit: Payer: Self-pay

## 2023-01-22 DIAGNOSIS — E119 Type 2 diabetes mellitus without complications: Secondary | ICD-10-CM | POA: Diagnosis present

## 2023-01-22 DIAGNOSIS — Z8249 Family history of ischemic heart disease and other diseases of the circulatory system: Secondary | ICD-10-CM | POA: Diagnosis not present

## 2023-01-22 DIAGNOSIS — Q2112 Patent foramen ovale: Secondary | ICD-10-CM | POA: Diagnosis not present

## 2023-01-22 DIAGNOSIS — Z7901 Long term (current) use of anticoagulants: Secondary | ICD-10-CM | POA: Diagnosis not present

## 2023-01-22 DIAGNOSIS — Z9049 Acquired absence of other specified parts of digestive tract: Secondary | ICD-10-CM | POA: Diagnosis not present

## 2023-01-22 DIAGNOSIS — R109 Unspecified abdominal pain: Secondary | ICD-10-CM | POA: Diagnosis present

## 2023-01-22 DIAGNOSIS — M109 Gout, unspecified: Secondary | ICD-10-CM | POA: Diagnosis present

## 2023-01-22 DIAGNOSIS — I252 Old myocardial infarction: Secondary | ICD-10-CM | POA: Diagnosis not present

## 2023-01-22 DIAGNOSIS — E876 Hypokalemia: Secondary | ICD-10-CM | POA: Diagnosis not present

## 2023-01-22 DIAGNOSIS — K5792 Diverticulitis of intestine, part unspecified, without perforation or abscess without bleeding: Secondary | ICD-10-CM | POA: Diagnosis present

## 2023-01-22 DIAGNOSIS — Z83438 Family history of other disorder of lipoprotein metabolism and other lipidemia: Secondary | ICD-10-CM | POA: Diagnosis not present

## 2023-01-22 DIAGNOSIS — Z96653 Presence of artificial knee joint, bilateral: Secondary | ICD-10-CM | POA: Diagnosis present

## 2023-01-22 DIAGNOSIS — Z833 Family history of diabetes mellitus: Secondary | ICD-10-CM | POA: Diagnosis not present

## 2023-01-22 DIAGNOSIS — Z79899 Other long term (current) drug therapy: Secondary | ICD-10-CM | POA: Diagnosis not present

## 2023-01-22 DIAGNOSIS — Z823 Family history of stroke: Secondary | ICD-10-CM | POA: Diagnosis not present

## 2023-01-22 DIAGNOSIS — K529 Noninfective gastroenteritis and colitis, unspecified: Secondary | ICD-10-CM | POA: Diagnosis present

## 2023-01-22 DIAGNOSIS — Z6828 Body mass index (BMI) 28.0-28.9, adult: Secondary | ICD-10-CM | POA: Diagnosis not present

## 2023-01-22 DIAGNOSIS — Z803 Family history of malignant neoplasm of breast: Secondary | ICD-10-CM | POA: Diagnosis not present

## 2023-01-22 DIAGNOSIS — E785 Hyperlipidemia, unspecified: Secondary | ICD-10-CM | POA: Diagnosis present

## 2023-01-22 DIAGNOSIS — I10 Essential (primary) hypertension: Secondary | ICD-10-CM | POA: Diagnosis present

## 2023-01-22 DIAGNOSIS — Z880 Allergy status to penicillin: Secondary | ICD-10-CM | POA: Diagnosis not present

## 2023-01-22 DIAGNOSIS — E669 Obesity, unspecified: Secondary | ICD-10-CM | POA: Diagnosis present

## 2023-01-22 DIAGNOSIS — K219 Gastro-esophageal reflux disease without esophagitis: Secondary | ICD-10-CM | POA: Diagnosis present

## 2023-01-22 DIAGNOSIS — K5732 Diverticulitis of large intestine without perforation or abscess without bleeding: Secondary | ICD-10-CM | POA: Diagnosis present

## 2023-01-22 DIAGNOSIS — Z8673 Personal history of transient ischemic attack (TIA), and cerebral infarction without residual deficits: Secondary | ICD-10-CM | POA: Diagnosis not present

## 2023-01-22 LAB — C DIFFICILE QUICK SCREEN W PCR REFLEX
C Diff antigen: NEGATIVE
C Diff interpretation: NOT DETECTED
C Diff toxin: NEGATIVE

## 2023-01-22 MED ORDER — APIXABAN 5 MG PO TABS
5.0000 mg | ORAL_TABLET | Freq: Two times a day (BID) | ORAL | Status: DC
  Administered 2023-01-22 – 2023-01-26 (×9): 5 mg via ORAL
  Filled 2023-01-22 (×9): qty 1

## 2023-01-22 MED ORDER — ROSUVASTATIN CALCIUM 20 MG PO TABS
40.0000 mg | ORAL_TABLET | Freq: Every day | ORAL | Status: DC
  Administered 2023-01-22 – 2023-01-25 (×4): 40 mg via ORAL
  Filled 2023-01-22 (×4): qty 2

## 2023-01-22 MED ORDER — ALLOPURINOL 100 MG PO TABS: 300.0000 mg | ORAL_TABLET | Freq: Two times a day (BID) | ORAL | Status: DC

## 2023-01-22 MED ORDER — FENOFIBRATE 160 MG PO TABS
160.0000 mg | ORAL_TABLET | Freq: Every day | ORAL | Status: DC
  Filled 2023-01-22: qty 1

## 2023-01-22 MED ORDER — ACETAMINOPHEN 325 MG PO TABS
650.0000 mg | ORAL_TABLET | Freq: Four times a day (QID) | ORAL | Status: DC | PRN
  Administered 2023-01-24 (×2): 650 mg via ORAL
  Filled 2023-01-22 (×2): qty 2

## 2023-01-22 MED ORDER — FENOFIBRATE 54 MG PO TABS
54.0000 mg | ORAL_TABLET | Freq: Every day | ORAL | Status: DC
  Administered 2023-01-22 – 2023-01-26 (×5): 54 mg via ORAL
  Filled 2023-01-22 (×5): qty 1

## 2023-01-22 MED ORDER — PIPERACILLIN-TAZOBACTAM 3.375 G IVPB 30 MIN
3.3750 g | Freq: Once | INTRAVENOUS | Status: AC
  Administered 2023-01-22: 3.375 g via INTRAVENOUS
  Filled 2023-01-22: qty 50

## 2023-01-22 MED ORDER — GABAPENTIN 300 MG PO CAPS
300.0000 mg | ORAL_CAPSULE | Freq: Every day | ORAL | Status: DC
  Administered 2023-01-22 – 2023-01-25 (×4): 300 mg via ORAL
  Filled 2023-01-22 (×4): qty 1

## 2023-01-22 MED ORDER — PIPERACILLIN-TAZOBACTAM 3.375 G IVPB
3.3750 g | Freq: Three times a day (TID) | INTRAVENOUS | Status: DC
  Administered 2023-01-22 – 2023-01-25 (×10): 3.375 g via INTRAVENOUS
  Filled 2023-01-22 (×10): qty 50

## 2023-01-22 MED ORDER — ONDANSETRON HCL 4 MG PO TABS: 4.0000 mg | ORAL_TABLET | Freq: Four times a day (QID) | ORAL | Status: DC | PRN

## 2023-01-22 MED ORDER — IOHEXOL 350 MG/ML SOLN
75.0000 mL | Freq: Once | INTRAVENOUS | Status: AC | PRN
  Administered 2023-01-22: 75 mL via INTRAVENOUS

## 2023-01-22 MED ORDER — ONDANSETRON HCL 4 MG/2ML IJ SOLN: 4.0000 mg | Freq: Four times a day (QID) | INTRAMUSCULAR | Status: DC | PRN

## 2023-01-22 MED ORDER — ACETAMINOPHEN 650 MG RE SUPP: 650.0000 mg | Freq: Four times a day (QID) | RECTAL | Status: DC | PRN

## 2023-01-22 MED ORDER — SODIUM CHLORIDE 0.9 % IV SOLN: INTRAVENOUS | Status: DC

## 2023-01-22 MED ORDER — DICYCLOMINE HCL 20 MG PO TABS
20.0000 mg | ORAL_TABLET | Freq: Two times a day (BID) | ORAL | Status: DC | PRN
  Administered 2023-01-23 (×2): 20 mg via ORAL
  Filled 2023-01-22 (×3): qty 1

## 2023-01-22 NOTE — ED Notes (Signed)
ED TO INPATIENT HANDOFF REPORT  ED Nurse Name and Phone #: Vernona Rieger [phone]  S Name/Age/Gender Rachel Black 72 y.o. female Room/Bed: 001C/001C  Code Status   Code Status: Full Code  Home/SNF/Other Home Patient oriented to: self, place, time, and situation Is this baseline? Yes   Triage Complete: Triage complete  Chief Complaint Diverticulitis [K57.92]  Triage Note Pt c/o generalized abd pain with diarrhea ongoing with previous visits for the same issue. Pt describes her diarrhea as "black". Pt endorses some nausea.    Allergies Allergies  Allergen Reactions   Other Other (See Comments)    Some BANDAIDS cause skin irritation    Penicillins Itching     Tolerated Zosyn couse 10/04/22    Level of Care/Admitting Diagnosis ED Disposition     ED Disposition  Admit   Condition  --   Comment  Hospital Area: [facility] [[id number]]  Level of Care: Telemetry Medical [104]  May place patient in observation at [facility] or [facility] if equivalent level of care is available:: No  Covid Evaluation: Asymptomatic - no recent exposure (last 10 days) testing not required  Diagnosis: Diverticulitis [960454]  Admitting Physician: Eduard Clos [phone]  Attending Physician: Eduard Clos Florian.Pax          B Medical/Surgery History Past Medical History:  Diagnosis Date   Arthritis    PAIN AND OA BOTH KNEES AND SHOULDERS AND ELBOWS AND WRIST   Blood transfusion without reported diagnosis 2018   after cholecystectomy   Broken foot, right, closed, initial encounter 09/15/2021   no surgery needed, fell at home and went to ED   Diabetes mellitus    ORAL MEDICATION   GERD (gastroesophageal reflux disease)    Gout    NO RECENT FLARE UPS   H/O: rheumatic fever    AS A CHILD - NO KNOWN HEART MURMUR OR HEART PROBLEMS   Heart attack (HCC)    Hyperlipidemia    Hypertension    PFO (patent foramen ovale)    Stroke (HCC) 03/2017   Past Surgical  History:  Procedure Laterality Date   ABDOMINAL HYSTERECTOMY     CESAREAN SECTION     CHOLECYSTECTOMY N/A 12/09/2016   Procedure: LAPAROSCOPIC CHOLECYSTECTOMY WITH INTRAOPERATIVE CHOLANGIOGRAM;  Surgeon: Almond Lint, MD;  Location: [facility] ORS;  Service: General;  Laterality: N/A;   COLONOSCOPY     COLONOSCOPY WITH PROPOFOL N/A 01/30/2013   Procedure: COLONOSCOPY WITH PROPOFOL;  Surgeon: Hilarie Fredrickson, MD;  Location: [facility] ENDOSCOPY;  Service: Endoscopy;  Laterality: N/A;   ERCP N/A 12/08/2016   Procedure: ENDOSCOPIC RETROGRADE CHOLANGIOPANCREATOGRAPHY (ERCP);  Surgeon: Meryl Dare, MD;  Location: [facility];  Service: Endoscopy;  Laterality: N/A;   ESOPHAGOGASTRODUODENOSCOPY (EGD) WITH PROPOFOL N/A 01/30/2013   Procedure: ESOPHAGOGASTRODUODENOSCOPY (EGD) WITH PROPOFOL;  Surgeon: Hilarie Fredrickson, MD;  Location: [facility] ENDOS[facility];  Service: Endoscopy;  Laterality: N/A;   LEFT HEART CATH AND CORONARY ANGIOGRAPHY N/A 10/18/2017   Procedure: LEFT HEART CATH AND CORONARY ANGIOGRAPHY;  Surgeon: Swaziland, Peter M, MD;  Location: [facility] INVASIVE CV LAB;  Service: Cardiovascular;  Laterality: N/A;   LOOP RECORDER INSERTION N/A 03/18/2017   Procedure: Loop Recorder Insertion;  Surgeon: Regan Lemming, MD;  Location: [facility] INVASIVE CV LAB;  Service: Cardiovascular;  Laterality: N/A;   PARTIAL HYSTERECTOMY     TEE WITHOUT CARDIOVERSION N/A 03/16/2017   Procedure: TRANSESOPHAGEAL ECHOCARDIOGRAM (TEE);  Surgeon: Elease Hashimoto Deloris Ping, MD;  Location: [facility] ENDOSCOPY;  Service: Cardiovascular;  Laterality: N/A;  TOTAL KNEE ARTHROPLASTY Right 04/20/2014   Procedure: RIGHT TOTAL KNEE ARTHROPLASTY CONVERTED TO RIGHT KNEE REIMPLANTATION;  Surgeon: Kathryne Hitch, MD;  Location: [facility];  Service: Orthopedics;  Laterality: Right;   TOTAL KNEE ARTHROPLASTY Left 08/23/2015   Procedure: LEFT TOTAL KNEE ARTHROPLASTY;  Surgeon: Kathryne Hitch, MD;  Location: [facility];  Service: Orthopedics;  Laterality: Left;   VENTRAL  HERNIA REPAIR     x2     A IV Location/Drains/Wounds Patient Lines/Drains/Airways Status     Active Line/Drains/Airways     Name Placement date Placement time Site Days   Peripheral IV 01/22/23 20 G Left Antecubital 01/22/23  0100  Antecubital  less than 1            Intake/Output Last 24 hours No intake or output data in the 24 hours ending 01/22/23 1019  Labs/Imaging Results for orders placed or performed during the hospital encounter of 01/21/23 (from the past 48 hour(s))  Urinalysis, Routine w reflex microscopic -Urine, Clean Catch     Status: Abnormal   Collection Time: 01/21/23  6:28 PM  Result Value Ref Range   Color, Urine YELLOW YELLOW   APPearance CLEAR CLEAR   Specific Gravity, Urine 1.015 1.005 - 1.030   pH 5.0 5.0 - 8.0   Glucose, UA NEGATIVE NEGATIVE mg/dL   Hgb urine dipstick NEGATIVE NEGATIVE   Bilirubin Urine NEGATIVE NEGATIVE   Ketones, ur NEGATIVE NEGATIVE mg/dL   Protein, ur NEGATIVE NEGATIVE mg/dL   Nitrite NEGATIVE NEGATIVE   Leukocytes,Ua TRACE (A) NEGATIVE   RBC / HPF 0-5 0 - 5 RBC/hpf   WBC, UA 0-5 0 - 5 WBC/hpf   Bacteria, UA NONE SEEN NONE SEEN   Squamous Epithelial / HPF 0-5 0 - 5 /HPF    Comment: Performed at [facility] Lab, [address], [place], [place] [address]  Lipase, blood     Status: Abnormal   Collection Time: 01/21/23  6:31 PM  Result Value Ref Range   Lipase 56 (H) 11 - 51 U/L    Comment: Performed at [facility], [address] [place], [place] [address]  Comprehensive metabolic panel     Status: Abnormal   Collection Time: 01/21/23  6:31 PM  Result Value Ref Range   Sodium 137 135 - 145 mmol/L   Potassium 4.4 3.5 - 5.1 mmol/L   Chloride 105 98 - 111 mmol/L   CO2 21 (L) 22 - 32 mmol/L   Glucose, Bld 102 (H) 70 - 99 mg/dL    Comment: Glucose reference range applies only to samples taken after fasting for at least 8 hours.   BUN 21 8 - 23 mg/dL   Creatinine, Ser 6.96 (H) 0.44 - 1.00 mg/dL   Calcium 9.2 8.9  - 29.5 mg/dL   Total Protein 6.0 (L) 6.5 - 8.1 g/dL   Albumin 3.9 3.5 - 5.0 g/dL   AST 23 15 - 41 U/L   ALT 18 0 - 44 U/L   Alkaline Phosphatase 34 (L) 38 - 126 U/L   Total Bilirubin 0.6 0.3 - 1.2 mg/dL   GFR, Estimated 57 (L) >60 mL/min    Comment: (NOTE) Calculated using the CKD-EPI Creatinine Equation (2021)    Anion gap 11 5 - 15    Comment: Performed at [facility], [address] [place], [place] [address]  CBC     Status: Abnormal   Collection Time: 01/21/23  6:31 PM  Result Value Ref Range  WBC 5.3 4.0 - 10.5 K/uL   RBC 3.88 3.87 - 5.11 MIL/uL   Hemoglobin 12.4 12.0 - 15.0 g/dL   HCT 40.3 47.4 - 25.9 %   MCV 100.5 (H) 80.0 - 100.0 fL   MCH 32.0 26.0 - 34.0 pg   MCHC 31.8 30.0 - 36.0 g/dL   RDW 56.3 87.5 - 64.3 %   Platelets 190 150 - 400 K/uL   nRBC 0.0 0.0 - 0.2 %    Comment: Performed at [facility], [address] [place], [place] [address]   CT ABDOMEN PELVIS W CONTRAST  Result Date: 01/22/2023 CLINICAL DATA:  Left lower quadrant abdominal pain. EXAM: CT ABDOMEN AND PELVIS WITH CONTRAST TECHNIQUE: Multidetector CT imaging of the abdomen and pelvis was performed using the standard protocol following bolus administration of intravenous contrast. RADIATION DOSE REDUCTION: This exam was performed according to the departmental dose-optimization program which includes automated exposure control, adjustment of the mA and/or kV according to patient size and/or use of iterative reconstruction technique. CONTRAST:  75mL OMNIPAQUE IOHEXOL 350 MG/ML SOLN COMPARISON:  Jan 13, 2023 FINDINGS: Lower chest: No acute abnormality. Hepatobiliary: No focal liver abnormality is seen. A moderate amount of predominant central pneumobilia is seen. This is mildly increased in severity when compared to the prior study. Status post cholecystectomy. No biliary dilatation. Pancreas: Unremarkable. No pancreatic ductal dilatation or surrounding inflammatory changes. Spleen: Normal in size  without focal abnormality. Adrenals/Urinary Tract: Adrenal glands are unremarkable. Kidneys are normal in size, without renal calculi or hydronephrosis. An extrarenal pelvis is seen on the right. A stable subcentimeter simple left renal cyst is seen on the left. Bladder is unremarkable. Stomach/Bowel: There is a small hiatal hernia. The appendix is not visualized. No evidence of bowel dilatation. Numerous diverticula are seen throughout the sigmoid colon. Mild thickening of the proximal to mid sigmoid colon is also noted. Vascular/Lymphatic: No significant vascular findings are present. No enlarged abdominal or pelvic lymph nodes. Reproductive: Status post hysterectomy. No adnexal masses. Other: No abdominal wall hernia or abnormality. No abdominopelvic ascites. Musculoskeletal: Multilevel degenerative changes are seen throughout the lumbar spine. IMPRESSION: 1. Sigmoid diverticulitis with additional findings likely consistent with mild sigmoid colitis. 2. Small hiatal hernia. 3. Evidence of prior cholecystectomy and hysterectomy. Electronically Signed   By: Aram Candela M.D.   On: 01/22/2023 01:33    Pending Labs Unresulted Labs (From admission, onward)     Start     Ordered   01/23/23 0500  Comprehensive metabolic panel  Tomorrow morning,   R        01/22/23 0726   01/23/23 0500  CBC  Tomorrow morning,   R        01/22/23 0726   01/23/23 0500  Magnesium  Tomorrow morning,   R        01/22/23 0726   01/22/23 0727  Gastrointestinal Panel by PCR , Stool  (Gastrointestinal Panel by PCR, Stool                                                                                                                                                     **  Does Not include CLOSTRIDIUM DIFFICILE testing. **If CDIFF testing is needed, place order from the "C Difficile Testing" order set.**)  Once,   R        01/22/23 0726   01/21/23 2312  C Difficile Quick Screen w PCR reflex  (C Difficile quick screen w PCR reflex  panel )  Once, for 24 hours,   URGENT       References:    CDiff Information Tool   01/21/23 2311            Vitals/Pain Today's Vitals   01/22/23 0722 01/22/23 0800 01/22/23 0856 01/22/23 0857  BP:  131/72    Pulse:  (!) 55    Resp:  15    Temp:   98.4 F (36.9 C)   TempSrc:   Oral   SpO2:  100%    Weight: 80.3 kg     Height: 5\' 6"  (1.676 m)     PainSc:    0-No pain    Isolation Precautions Enteric precautions (UV disinfection)  Medications Medications  rosuvastatin (CRESTOR) tablet 40 mg (has no administration in time range)  dicyclomine (BENTYL) tablet 20 mg (has no administration in time range)  gabapentin (NEURONTIN) capsule 300 mg (has no administration in time range)  acetaminophen (TYLENOL) tablet 650 mg (has no administration in time range)    Or  acetaminophen (TYLENOL) suppository 650 mg (has no administration in time range)  ondansetron (ZOFRAN) tablet 4 mg (has no administration in time range)    Or  ondansetron (ZOFRAN) injection 4 mg (has no administration in time range)  0.9 %  sodium chloride infusion ( Intravenous New Bag/Given 01/22/23 0802)  piperacillin-tazobactam (ZOSYN) IVPB 3.375 g (3.375 g Intravenous New Bag/Given 01/22/23 1002)  apixaban (ELIQUIS) tablet 5 mg (5 mg Oral Given 01/22/23 0959)  fenofibrate tablet 54 mg (has no administration in time range)  iohexol (OMNIPAQUE) 350 MG/ML injection 75 mL (75 mLs Intravenous Contrast Given 01/22/23 0115)  piperacillin-tazobactam (ZOSYN) IVPB 3.375 g (0 g Intravenous Stopped 01/22/23 0454)    Mobility walks with person assist or device     Focused Assessments Abd pain with diarrhea and nausea   R Recommendations: See Admitting Provider Note  Report given to:   Additional Notes: Stand-by assist with ambulation

## 2023-01-22 NOTE — Discharge Instructions (Signed)
Take the prescribed medication as directed.  Make sure to eat/drink-- may be helpful to follow soft/bland diet for now. Follow-up with your primary care doctor and/or your gastroenterologist.   Return to the ED for new or worsening symptoms.

## 2023-01-22 NOTE — TOC Progression Note (Signed)
Transition of Care [facility]) - Progression Note    Patient Details  Name: BARBARAJO JAGGER MRN: [id number] Date of Birth: [birth year]  Transition of Care [facility]) CM/SW Contact  Janae Bridgeman, RN Phone Number: 01/22/2023, 3:09 PM  Clinical Narrative:     Transition of Care [facility]) Screening Note   Patient Details  Name: BERNETTA DESARRO Date of Birth: 1951-01-03   Transition of Care [facility]) CM/SW Contact:    Janae Bridgeman, RN Phone Number: 01/22/2023, 3:09 PM    Transition of Care Department [facility]) has reviewed patient and no TOC needs have been identified at this time.   We will continue to monitor patient advancement through interdisciplinary progression rounds. If new patient transition needs arise, please place a TOC consult.          Expected Discharge Plan and Services                                               Social Determinants of Health (SDOH) Interventions SDOH Screenings   Food Insecurity: No Food Insecurity (01/22/2023)  Housing: Low Risk  (01/22/2023)  Transportation Needs: No Transportation Needs (01/22/2023)  Utilities: Not At Risk (01/22/2023)  Depression (PHQ2-9): Medium Risk (09/28/2022)  Tobacco Use: Low Risk  (01/22/2023)    Readmission Risk Interventions     No data to display

## 2023-01-22 NOTE — ED Notes (Signed)
Pt is A&O x4. Denies any pain at this time. Pt wheeled to restroom per pt request. Pt states she is feeling a little weak and didn't want to fall. Pt is continent and able to care for her ADL's.

## 2023-01-22 NOTE — ED Notes (Signed)
Pt has tried twice to give a stool sample with no luck.

## 2023-01-22 NOTE — Progress Notes (Signed)
ANTICOAGULATION CONSULT NOTE - Initial Consult  Pharmacy Consult for Apixaban Indication: pulmonary embolus (apixaban treatment initiated on 01/14/23)  Allergies  Allergen Reactions   Other Other (See Comments)    Some BANDAIDS cause skin irritation    Penicillins Itching     Tolerated Zosyn couse 10/04/22    Patient Measurements: Height: 5\' 6"  (167.6 cm) Weight: 80.3 kg (177 lb 0.5 oz) IBW/kg (Calculated) : 59.3  Vital Signs: Temp: 98.1 F (36.7 C) (05/10 0413) Temp Source: Oral (05/10 0413) BP: 131/72 (05/10 0800) Pulse Rate: 55 (05/10 0800)  Labs: Recent Labs    01/21/23 1831  HGB 12.4  HCT 39.0  PLT 190  CREATININE 1.06*    Estimated Creatinine Clearance: 52.8 mL/min (A) (by C-G formula based on SCr of 1.06 mg/dL (H)).   Medical History: Past Medical History:  Diagnosis Date   Arthritis    PAIN AND OA BOTH KNEES AND SHOULDERS AND ELBOWS AND WRIST   Blood transfusion without reported diagnosis 2018   after cholecystectomy   Broken foot, right, closed, initial encounter 09/15/2021   no surgery needed, fell at home and went to ED   Diabetes mellitus    ORAL MEDICATION   GERD (gastroesophageal reflux disease)    Gout    NO RECENT FLARE UPS   H/O: rheumatic fever    AS A CHILD - NO KNOWN HEART MURMUR OR HEART PROBLEMS   Heart attack (HCC)    Hyperlipidemia    Hypertension    PFO (patent foramen ovale)    Stroke (HCC) 03/2017    Medications:  (Not in a hospital admission)   Assessment: 72 yo F presents to ED with diarrhea and worsening abdominal pain despite compliance with ciprofloxacin and metronidazole therapy for treatment of colitis since discharge on 01/15/23. Pt was diagnosed with an incidental finding of an acute PE on 01/14/23. She was initiated on apixaban 10mg  PO BID x7 days on 01/14/23 and dose was reduced to 5mg  PO BID on 01/21/23. Pharmacy consulted to resume home apixaban for treatment of PE.  Patient reports taking last dose on 5/9 at 05:00.    Hgb 12.4, Plt 190 - stable No s/sx of bleeding. Diarrhea is non-bloody.  Goal of Therapy:  Monitor platelets by anticoagulation protocol: Yes   Plan:  Apixaban 5mg  PO BID  Monitor daily CBC and for s/sx of bleeding   Wilburn Cornelia, PharmD, BCPS Clinical Pharmacist 01/22/2023 8:29 AM   Please refer to AMION for pharmacy phone number

## 2023-01-22 NOTE — Progress Notes (Signed)
Pharmacy Antibiotic Note  Rachel Black is a 72 y.o. female admitted on 01/21/2023 with diarrhea and abdominal pain concerning for  intra-abdominal infection . Of note, patient was recently admitted on 5/1 for colitis and was discharged home with ciprofloxacin and metronidazole to continue through 01/22/23. Patient reports adherence to this antibiotic regimen and symptoms worsening, resulting in return to hospital on 5/9. Pharmacy has been consulted for Zosyn (piperacillin-tazobactam) dosing.  Patient received Zosyn 3.375g IV x1 over 30 minutes at 04:24 on 01/22/23  WBC 5.3, afebrile SCr 1.06 (stable since discharge on 5/3)  Plan: Zosyn 3.375g IV q8h (4 hour infusion). Monitor daily CBC, temp, SCr, and for clinical signs of improvement  F/u cultures and de-escalate antibiotics as able   Height: 5\' 6"  (167.6 cm) Weight: 80.3 kg (177 lb 0.5 oz) IBW/kg (Calculated) : 59.3  Temp (24hrs), Avg:98.4 F (36.9 C), Min:98.1 F (36.7 C), Max:98.9 F (37.2 C)  Recent Labs  Lab 01/17/23 1924 01/21/23 1831  WBC 5.4 5.3  CREATININE 1.10* 1.06*    Estimated Creatinine Clearance: 52.8 mL/min (A) (by C-G formula based on SCr of 1.06 mg/dL (H)).    Allergies  Allergen Reactions   Other Other (See Comments)    Some BANDAIDS cause skin irritation    Penicillins Itching     Tolerated Zosyn couse 10/04/22    Antimicrobials this admission: Zosyn 5/10 >>   (Received ceftriaxone 5/1-5/2 during previous admission, metronidazole 5/2-5/10 (during previous admission and upon discharge home), and ciprofloxacin 5/3-5/10 since discharge home)  Dose adjustments this admission: N/A  Microbiology results: 5/10 C diff: sent 5/10 GI panel PCR, stool: sent  Thank you for allowing pharmacy to be a part of this patient's care.  Wilburn Cornelia, PharmD, BCPS Clinical Pharmacist 01/22/2023 8:21 AM   Please refer to AMION for pharmacy phone number

## 2023-01-22 NOTE — H&P (Addendum)
History and Physical    Rachel Black:[id number] DOB: 01/06/1951 DOA: 01/21/2023  PCP: Melida Quitter, PA   Patient coming from: Home  I have personally briefly reviewed patient's old medical records in [facility]  Chief Complaint: Abdominal pain and diarrhea  HPI: Rachel Black is a 72 y.o. female with medical history significant of gout, hyperlipidemia, hypertension, CAD, osteoarthritis, PFO, unspecified CVA, recent admission and discharge from 01/14/2023-01/15/2023 for acute colitis along with small right lower lobe acute PE treated with IV antibiotics along with subcutaneous Lovenox and discharged on oral ciprofloxacin and Flagyl and oral Eliquis presented with worsening abdominal pain and diarrhea.  Patient has been having worsening diarrhea for the last few days along with intermittent crampy abdominal pain, mostly in the lower abdomen.  She was seen in the ED for the same reason on 01/18/2023 and discharged on oral Bentyl but patient continued to have loose stools and abdominal pain.  Also complains of nausea but no vomiting.  Patient denies any fever, chest pain, worsening shortness of breath, dysuria, increased frequency of urination, loss of consciousness or seizures.  ED Course: Labs were unremarkable including a negative UA.  CT of abdomen and pelvis with contrast showed sigmoid diverticulitis with mild sigmoid colitis.  She was started on IV Zosyn. Hospitalist service was called to evaluate the patient.  Review of Systems: As per HPI otherwise all other systems were reviewed and are negative.   Past Medical History:  Diagnosis Date   Arthritis    PAIN AND OA BOTH KNEES AND SHOULDERS AND ELBOWS AND WRIST   Blood transfusion without reported diagnosis 2018   after cholecystectomy   Broken foot, right, closed, initial encounter 09/15/2021   no surgery needed, fell at home and went to ED   Diabetes mellitus    ORAL MEDICATION   GERD (gastroesophageal reflux disease)     Gout    NO RECENT FLARE UPS   H/O: rheumatic fever    AS A CHILD - NO KNOWN HEART MURMUR OR HEART PROBLEMS   Heart attack (HCC)    Hyperlipidemia    Hypertension    PFO (patent foramen ovale)    Stroke (HCC) 03/2017    Past Surgical History:  Procedure Laterality Date   ABDOMINAL HYSTERECTOMY     CESAREAN SECTION     CHOLECYSTECTOMY N/A 12/09/2016   Procedure: LAPAROSCOPIC CHOLECYSTECTOMY WITH INTRAOPERATIVE CHOLANGIOGRAM;  Surgeon: Almond Lint, MD;  Location: [facility] OR[facility];  Service: General;  Laterality: N/A;   COLONOSCOPY     COLONOSCOPY WITH PROPOFOL N/A 01/30/2013   Procedure: COLONOSCOPY WITH PROPOFOL;  Surgeon: Hilarie Fredrickson, MD;  Location: [facility] ENDOSCOPY;  Service: Endoscopy;  Laterality: N/A;   ERCP N/A 12/08/2016   Procedure: ENDOSCOPIC RETROGRADE CHOLANGIOPANCREATOGRAPHY (ERCP);  Surgeon: Meryl Dare, MD;  Location: [facility];  Service: Endoscopy;  Laterality: N/A;   ESOPHAGOGASTRODUODENOSCOPY (EGD) WITH PROPOFOL N/A 01/30/2013   Procedure: ESOPHAGOGASTRODUODENOSCOPY (EGD) WITH PROPOFOL;  Surgeon: Hilarie Fredrickson, MD;  Location: [facility] ENDOSCOPY;  Service: Endoscopy;  Laterality: N/A;   LEFT HEART CATH AND CORONARY ANGIOGRAPHY N/A 10/18/2017   Procedure: LEFT HEART CATH AND CORONARY ANGIOGRAPHY;  Surgeon: Swazil[place], Peter M, MD;  Location: [facility] INVASIVE CV LAB;  Service: Cardiovascular;  Laterality: N/A;   LOOP RECORDER INSERTION N/A 03/18/2017   Procedure: Loop Recorder Insertion;  Surgeon: Regan Lemming, MD;  Location: [facility] INVASIVE CV LAB;  Service: Cardiovascular;  Laterality: N/A;   PARTIAL HYSTERECTOMY     TEE WITHOUT CARDIOVERSION  N/A 03/16/2017   Procedure: TRANSESOPHAGEAL ECHOCARDIOGRAM (TEE);  Surgeon: Elease Hashimoto Deloris Ping, MD;  Location: [facility] ENDOSCOPY;  Service: Cardiovascular;  Laterality: N/A;   TOTAL KNEE ARTHROPLASTY Right 04/20/2014   Procedure: RIGHT TOTAL KNEE ARTHROPLASTY CONVERTED TO RIGHT KNEE REIMPLANTATION;  Surgeon: Kathryne Hitch, MD;  Location: [facility]  ORS;  Service: Orthopedics;  Laterality: Right;   TOTAL KNEE ARTHROPLASTY Left 08/23/2015   Procedure: LEFT TOTAL KNEE ARTHROPLASTY;  Surgeon: Kathryne Hitch, MD;  Location: [facility];  Service: Orthopedics;  Laterality: Left;   VENTRAL HERNIA REPAIR     x2     reports that she has never smoked. She has never used smokeless tobacco. She reports that she does not currently use alcohol. She reports that she does not use drugs.  Allergies  Allergen Reactions   Other Other (See Comments)    Some BANDAIDS cause skin irritation    Penicillins Itching     Tolerated Zosyn couse 10/04/22    Family History  Problem Relation Age of Onset   Diabetes Mother    Hypertension Mother        entire family   Breast cancer Mother        diagnosed in her 40's   Stroke Father        CVA   Hyperlipidemia Father        entire family   Diabetes Father    Heart disease Father        No details.  Not at an early age.     Crohn's disease Son    Irritable bowel syndrome Son    Colon cancer Neg Hx    Colon polyps Neg Hx    Esophageal cancer Neg Hx    Stomach cancer Neg Hx    Rectal cancer Neg Hx     Prior to Admission medications   Medication Sig Start Date End Date Taking? Authorizing Provider  acetaminophen (TYLENOL) 500 MG tablet Take 1,000 mg by mouth every 6 (six) hours as needed for mild pain or moderate pain.   Yes [provider]  allopurinol (ZYLOPRIM) 300 MG tablet TAKE 1 TABLET BY MOUTH TWICE A DAY 08/27/22  Yes Boscia, Heather E, NP  APIXABAN (ELIQUIS) VTE STARTER PACK (10MG  AND 5MG ) Take as directed on package: start with two-5mg  tablets twice daily for 7 days. On day 8, switch to one-5mg  tablet twice daily. 01/15/23  Yes Pahwani, Daleen Bo, MD  CALCIUM PO Take 2 tablets by mouth daily.   Yes [provider]  ciprofloxacin (CIPRO) 500 MG tablet Take 1 tablet (500 mg total) by mouth 2 (two) times daily for 7 days. 01/15/23 01/22/23 Yes Pahwani, Daleen Bo, MD  dicyclomine (BENTYL)  20 MG tablet Take 1 tablet (20 mg total) by mouth every 12 (twelve) hours as needed (for abdominal pain/cramping). 01/18/23  Yes Antony Madura, PA-C  Emollient (UDDERLY SMOOTH) CREA Apply 1 application topically See admin instructions. Apply topically to skin folds as needed for rash/irritation   Yes [provider]  famotidine (PEPCID) 10 MG tablet Take 10 mg by mouth daily as needed for heartburn or indigestion.   Yes [provider]  fenofibrate 160 MG tablet Take 1 tablet (160 mg total) by mouth daily. 09/10/22  Yes Boscia, Heather E, NP  ferrous sulfate 325 (65 FE) MG tablet TAKE 1 TABLET BY MOUTH TWICE A DAY Patient taking differently: Take 325 mg by mouth daily. 08/03/22  Yes Zehr, Princella Pellegrini, PA-C  gabapentin (NEURONTIN) 300 MG capsule Take 1  capsule (300 mg total) by mouth at bedtime. 12/21/22  Yes Kathryne Hitch, MD  isosorbide mononitrate (IMDUR) 30 MG 24 hr tablet Take 30 mg by mouth daily. 11/30/22  Yes [provider]  LINZESS 72 MCG capsule TAKE 1 CAPSULE BY MOUTH DAILY BEFORE BREAKFAST. Patient taking differently: Take 72 mcg by mouth daily before breakfast. 12/15/22  Yes Saralyn Pilar A, PA  metroNIDAZOLE (FLAGYL) 500 MG tablet Take 1 tablet (500 mg total) by mouth 3 (three) times daily for 7 days. 01/15/23 01/22/23 Yes Pahwani, Daleen Bo, MD  olmesartan (BENICAR) 5 MG tablet Take 1 tablet (5 mg total) by mouth daily. 10/23/22  Yes Boscia, Heather E, NP  pantoprazole (PROTONIX) 40 MG tablet Take 40 mg by mouth daily. 12/26/22  Yes [provider]  rosuvastatin (CRESTOR) 40 MG tablet TAKE 1 TABLET BY MOUTH EVERY DAY 01/04/23  Yes Rollene Rotunda, MD  ACCU-CHEK AVIVA PLUS test strip USE AS INSTRUCTED ONCE A DAY. DX CODE E11.9 07/20/22   Mayer Masker, PA-C  Accu-Chek Softclix Lancets lancets Use as instructed once a day.  DX Code: E11.9 03/24/22   Mayer Masker, PA-C    Physical Exam: Vitals:   01/22/23 0630 01/22/23 0722 01/22/23 0800 01/22/23 0856   BP: 90/68  131/72   Pulse: (!) 55  (!) 55   Resp: 15  15   Temp:    98.4 F (36.9 C)  TempSrc:    Oral  SpO2: 99%  100%   Weight:  80.3 kg    Height:  5\' 6"  (1.676 m)      Constitutional: NAD, calm, comfortable.  On room air. Vitals:   01/22/23 0630 01/22/23 0722 01/22/23 0800 01/22/23 0856  BP: 90/68  131/72   Pulse: (!) 55  (!) 55   Resp: 15  15   Temp:    98.4 F (36.9 C)  TempSrc:    Oral  SpO2: 99%  100%   Weight:  80.3 kg    Height:  5\' 6"  (1.676 m)     Eyes: PERRL, lids and conjunctivae normal ENMT: Mucous membranes are dry.  Posterior pharynx clear of any exudate or lesions. Neck: normal, supple, no masses, no thyromegaly Respiratory: bilateral decreased breath sounds at bases, no wheezing, no crackles. Normal respiratory effort. No accessory muscle use.  Cardiovascular: S1 S2 positive, mild intermittent bradycardia present.  No extremity edema. 2+ pedal pulses.  Abdomen: Lower quadrant tenderness present, no masses palpated. No hepatosplenomegaly. Bowel sounds positive.  Musculoskeletal: no clubbing / cyanosis. No joint deformity upper and lower extremities.  Skin: no rashes, lesions, ulcers. No induration Neurologic: CN 2-12 grossly intact. Moving extremities. No focal neurologic deficits.  Psychiatric: Normal judgment and insight. Alert and oriented x 3. Normal mood.    Labs on Admission: I have personally reviewed following labs and imaging studies  CBC: Recent Labs  Lab 01/17/23 1924 01/21/23 1831  WBC 5.4 5.3  HGB 12.0 12.4  HCT 36.1 39.0  MCV 97.8 100.5*  PLT 187 190   Basic Metabolic Panel: Recent Labs  Lab 01/17/23 1924 01/21/23 1831  NA 136 137  K 4.0 4.4  CL 107 105  CO2 22 21*  GLUCOSE 105* 102*  BUN 20 21  CREATININE 1.10* 1.06*  CALCIUM 9.6 9.2   GFR: Estimated Creatinine Clearance: 52.8 mL/min (A) (by C-G formula based on SCr of 1.06 mg/dL (H)). Liver Function Tests: Recent Labs  Lab 01/17/23 1924 01/21/23 1831  AST 26 23   ALT 19 18  ALKPHOS 38 34*  BILITOT 0.4 0.6  PROT 5.8* 6.0*  ALBUMIN 3.7 3.9   Recent Labs  Lab 01/17/23 1924 01/21/23 1831  LIPASE 46 56*   No results for input(s): "AMMONIA" in the last 168 hours. Coagulation Profile: No results for input(s): "INR", "PROTIME" in the last 168 hours. Cardiac Enzymes: No results for input(s): "CKTOTAL", "CKMB", "CKMBINDEX", "TROPONINI" in the last 168 hours. BNP (last 3 results) No results for input(s): "PROBNP" in the last 8760 hours. HbA1C: No results for input(s): "HGBA1C" in the last 72 hours. CBG: No results for input(s): "GLUCAP" in the last 168 hours. Lipid Profile: No results for input(s): "CHOL", "HDL", "LDLCALC", "TRIG", "CHOLHDL", "LDLDIRECT" in the last 72 hours. Thyroid Function Tests: No results for input(s): "TSH", "T4TOTAL", "FREET4", "T3FREE", "THYROIDAB" in the last 72 hours. Anemia Panel: No results for input(s): "VITAMINB12", "FOLATE", "FERRITIN", "TIBC", "IRON", "RETICCTPCT" in the last 72 hours. Urine analysis:    Component Value Date/Time   COLORURINE YELLOW 01/21/2023 1828   APPEARANCEUR CLEAR 01/21/2023 1828   LABSPEC 1.015 01/21/2023 1828   PHURINE 5.0 01/21/2023 1828   GLUCOSEU NEGATIVE 01/21/2023 1828   HGBUR NEGATIVE 01/21/2023 1828   BILIRUBINUR NEGATIVE 01/21/2023 1828   BILIRUBINUR neg 12/09/2015 1701   KETONESUR NEGATIVE 01/21/2023 1828   PROTEINUR NEGATIVE 01/21/2023 1828   UROBILINOGEN 0.2 12/09/2015 1701   UROBILINOGEN 0.2 04/13/2014 1422   NITRITE NEGATIVE 01/21/2023 1828   LEUKOCYTESUR TRACE (A) 01/21/2023 1828    Radiological Exams on Admission: CT ABDOMEN PELVIS W CONTRAST  Result Date: 01/22/2023 CLINICAL DATA:  Left lower quadrant abdominal pain. EXAM: CT ABDOMEN AND PELVIS WITH CONTRAST TECHNIQUE: Multidetector CT imaging of the abdomen and pelvis was performed using the standard protocol following bolus administration of intravenous contrast. RADIATION DOSE REDUCTION: This exam was  performed according to the departmental dose-optimization program which includes automated exposure control, adjustment of the mA and/or kV according to patient size and/or use of iterative reconstruction technique. CONTRAST:  75mL OMNIPAQUE IOHEXOL 350 MG/ML SOLN COMPARISON:  Jan 13, 2023 FINDINGS: Lower chest: No acute abnormality. Hepatobiliary: No focal liver abnormality is seen. A moderate amount of predominant central pneumobilia is seen. This is mildly increased in severity when compared to the prior study. Status post cholecystectomy. No biliary dilatation. Pancreas: Unremarkable. No pancreatic ductal dilatation or surrounding inflammatory changes. Spleen: Normal in size without focal abnormality. Adrenals/Urinary Tract: Adrenal glands are unremarkable. Kidneys are normal in size, without renal calculi or hydronephrosis. An extrarenal pelvis is seen on the right. A stable subcentimeter simple left renal cyst is seen on the left. Bladder is unremarkable. Stomach/Bowel: There is a small hiatal hernia. The appendix is not visualized. No evidence of bowel dilatation. Numerous diverticula are seen throughout the sigmoid colon. Mild thickening of the proximal to mid sigmoid colon is also noted. Vascular/Lymphatic: No significant vascular findings are present. No enlarged abdominal or pelvic lymph nodes. Reproductive: Status post hysterectomy. No adnexal masses. Other: No abdominal wall hernia or abnormality. No abdominopelvic ascites. Musculoskeletal: Multilevel degenerative changes are seen throughout the lumbar spine. IMPRESSION: 1. Sigmoid diverticulitis with additional findings likely consistent with mild sigmoid colitis. 2. Small hiatal hernia. 3. Evidence of prior cholecystectomy and hysterectomy. Electronically Signed   By: Aram Candela M.D.   On: 01/22/2023 01:33     Assessment/Plan  Acute sigmoid diverticulitis with sigmoid colitis with possible outpatient treatment failure -Patient was recently  admitted and discharged on oral ciprofloxacin and Flagyl for acute colitis but presented with worsening abdominal pain and  diarrhea.  CT abdomen as above. -Check stool for C. difficile and GI PCR -Zosyn has been started which will be continued. -Use IV analgesics and antiemetics as needed.  Recent diagnosis of acute small right lower lobe PE -Continue Eliquis  Essential hypertension -Monitor blood pressure.  Currently on the lower side.  Home antihypertensives on hold for now.  Hyperlipidemia -Continue Crestor.  Continue fenofibrate but at a renally adjusted dose  History of gout -Will hold allopurinol for now as patient is having diarrhea.   DVT prophylaxis: Eliquis Code Status: Full Family Communication: Husband at bedside Disposition Plan: Home in 1 to 2 days once clinically improved Consults called: None Admission status: Observation/telemetry  Severity of Illness: The appropriate patient status for this patient is OBSERVATION. Observation status is judged to be reasonable and necessary in order to provide the required intensity of service to ensure the patient's safety. The patient's presenting symptoms, physical exam findings, and initial radiographic and laboratory data in the context of their medical condition is felt to place them at decreased risk for further clinical deterioration. Furthermore, it is anticipated that the patient will be medically stable for discharge from the hospital within 2 midnights of admission.     Glade Lloyd MD [facility]  01/22/2023, 9:52 AM

## 2023-01-23 DIAGNOSIS — K5792 Diverticulitis of intestine, part unspecified, without perforation or abscess without bleeding: Secondary | ICD-10-CM | POA: Diagnosis not present

## 2023-01-23 DIAGNOSIS — I10 Essential (primary) hypertension: Secondary | ICD-10-CM

## 2023-01-23 DIAGNOSIS — E785 Hyperlipidemia, unspecified: Secondary | ICD-10-CM

## 2023-01-23 DIAGNOSIS — K529 Noninfective gastroenteritis and colitis, unspecified: Secondary | ICD-10-CM

## 2023-01-23 LAB — GASTROINTESTINAL PANEL BY PCR, STOOL (REPLACES STOOL CULTURE)

## 2023-01-23 LAB — COMPREHENSIVE METABOLIC PANEL
ALT: 16 U/L (ref 0–44)
AST: 17 U/L (ref 15–41)
Albumin: 3.2 g/dL — ABNORMAL LOW (ref 3.5–5.0)
Alkaline Phosphatase: 31 U/L — ABNORMAL LOW (ref 38–126)
Anion gap: 9 (ref 5–15)
BUN: 11 mg/dL (ref 8–23)
CO2: 22 mmol/L (ref 22–32)
Calcium: 9.1 mg/dL (ref 8.9–10.3)
Chloride: 106 mmol/L (ref 98–111)
Creatinine, Ser: 1 mg/dL (ref 0.44–1.00)
GFR, Estimated: >60 mL/min (ref >60)
Glucose, Bld: 95 mg/dL (ref 70–99)
Potassium: 3.4 mmol/L — ABNORMAL LOW (ref 3.5–5.1)
Sodium: 137 mmol/L (ref 135–145)
Total Bilirubin: 0.7 mg/dL (ref 0.3–1.2)
Total Protein: 5.3 g/dL — ABNORMAL LOW (ref 6.5–8.1)

## 2023-01-23 LAB — CBC
HCT: 35.3 % — ABNORMAL LOW (ref 36.0–46.0)
Hemoglobin: 11.6 g/dL — ABNORMAL LOW (ref 12.0–15.0)
MCH: 32.2 pg (ref 26.0–34.0)
MCHC: 32.9 g/dL (ref 30.0–36.0)
MCV: 98.1 fL (ref 80.0–100.0)
Platelets: 164 10*3/uL (ref 150–400)
RBC: 3.6 MIL/uL — ABNORMAL LOW (ref 3.87–5.11)
RDW: 13.3 % (ref 11.5–15.5)
WBC: 4.6 10*3/uL (ref 4.0–10.5)
nRBC: 0 % (ref 0.0–0.2)

## 2023-01-23 LAB — MAGNESIUM: Magnesium: 1.9 mg/dL (ref 1.7–2.4)

## 2023-01-23 MED ORDER — POTASSIUM CHLORIDE 10 MEQ/100ML IV SOLN
10.0000 meq | INTRAVENOUS | Status: AC
  Administered 2023-01-23: 10 meq via INTRAVENOUS
  Filled 2023-01-23: qty 100

## 2023-01-23 MED ORDER — POTASSIUM CHLORIDE 10 MEQ/100ML IV SOLN
10.0000 meq | INTRAVENOUS | Status: AC
  Administered 2023-01-23 (×4): 10 meq via INTRAVENOUS
  Filled 2023-01-23 (×4): qty 100

## 2023-01-23 NOTE — Progress Notes (Signed)
Rachel Black:[id number] DOB: [birth year] DOA: 01/21/2023 PCP: Melida Quitter, PA   Subj: AEMILIA Black is a 72 y.o. WF PMHx gout, HLD, HTN,, CAD, osteoarthritis, PFO, unspecified CVA, recent admission and discharge from 01/14/2023-01/15/2023 for acute colitis along with small right lower lobe acute PE treated with IV antibiotics along with subcutaneous Lovenox and discharged on oral ciprofloxacin and Flagyl and oral Eliquis   Presented with worsening abdominal pain and diarrhea.  Patient has been having worsening diarrhea for the last few days along with intermittent crampy abdominal pain, mostly in the lower abdomen.  She was seen in the ED for the same reason on 01/18/2023 and discharged on oral Bentyl but patient continued to have loose stools and abdominal pain.  Also complains of nausea but no vomiting.  Patient denies any fever, chest pain, worsening shortness of breath, dysuria, increased frequency of urination, loss of consciousness or seizures.   ED Course: Labs were unremarkable including a negative UA.  CT of abdomen and pelvis with contrast showed sigmoid diverticulitis with mild sigmoid colitis.  She was started on IV Zosyn.   Obj: A/O x 4, states has been eating negative nausea, negative vomiting, negative diarrhea.  Positive abdominal pain   Objective: VITAL SIGNS: Temp: 98 F (36.7 C) (05/11 0814) BP: 116/63 (05/11 0814) Pulse Rate: 51 (05/11 0814)   VENTILATOR SETTINGS: **  Procedures/Significant Events:    Consultants:     Cultures 5/10 C. difficile antigen/toxin negative  Antimicrobials: Anti-infectives (From admission, onward)    Start     Ordered Stop   01/22/23 1000  piperacillin-tazobactam (ZOSYN) IVPB 3.375 g        01/22/23 0813     01/22/23 0300  piperacillin-tazobactam (ZOSYN) IVPB 3.375 g        01/22/23 0250 01/22/23 0454        Intake/Output Summary (Last 24 hours) at 01/23/2023 0827 Last data filed at 01/23/2023 0352 Gross per 24 hour   Intake 996.2 ml  Output --  Net 996.2 ml     Exam: Physical Exam:  General: A/O x 4, No acute respiratory distress Eyes: negative scleral hemorrhage, negative anisocoria, negative icterus ENT: Negative Runny nose, negative gingival bleeding, Neck:  Negative scars, masses, torticollis, lymphadenopathy, JVD Lungs: Clear to auscultation bilaterally without wheezes or crackles Cardiovascular: Regular rate and rhythm without murmur gallop or rub normal S1 and S2 Abdomen: Positive abdominal pain with palpation > RUQ, nondistended, positive soft, bowel sounds, no rebound, no ascites, no appreciable mass Extremities: No significant cyanosis, clubbing, or edema bilateral lower extremities Skin: Negative rashes, lesions, ulcers Psychiatric:  Negative depression, negative anxiety, negative fatigue, negative mania  Central nervous system:  Cranial nerves II through XII intact, tongue/uvula midline, all extremities muscle strength 5/5, sensation intact throughout, negative dysarthria, negative expressive aphasia, negative receptive aphasia.   .   DVT prophylaxis: Eliquis Code Status: Full Family Communication:  Status is: Inpatient    Dispo: The patient is from: Home              Anticipated d/c is to: Home              Anticipated d/c date is: > 3 days              Patient currently is not medically stable to d/c.      Assessment & Plan: Covid vaccination;   Principal Problem:   Diverticulitis Active Problems:   Colitis  Acute sigmoid diverticulitis/sigmoid colitis -Out treatment failure -  Normal saline 14ml/hr -Zosyn x 7 days minimal - Tolerating heart healthy diet. -Normal saline 33ml/hr - 5/10 C. difficile antigen/toxin negative   Hx RUL PE - Eliquis  Essential HTN -On hold secondary to BP being on low side.  HLD -Continue Crestor.   -5/11 lipid panel pending    Hx gout -Will hold allopurinol for now as patient is having diarrhea. -5/11 uric acid  pending  Hypokalemia - Potassium goal> 4 - 5/11 potassium IV 50 mEq     Mobility Assessment (last 72 hours)     Mobility Assessment     Row Name 01/22/23 1300 01/22/23 07:26:16         Does patient have an order for bedrest or is patient medically unstable No - Continue assessment No - Continue assessment      What is the highest level of mobility based on the progressive mobility assessment? Level 5 (Walks with assist in room/hall) - Balance while stepping forward/back and can walk in room with assist - Complete --                    Time: 50 minutes         Care during the described time interval was provided by me .  I have reviewed this patient's available data, including medical history, events of note, physical examination, and all test results as part of my evaluation.

## 2023-01-24 DIAGNOSIS — K5792 Diverticulitis of intestine, part unspecified, without perforation or abscess without bleeding: Secondary | ICD-10-CM | POA: Diagnosis not present

## 2023-01-24 DIAGNOSIS — E785 Hyperlipidemia, unspecified: Secondary | ICD-10-CM | POA: Diagnosis not present

## 2023-01-24 DIAGNOSIS — I10 Essential (primary) hypertension: Secondary | ICD-10-CM | POA: Diagnosis not present

## 2023-01-24 DIAGNOSIS — K529 Noninfective gastroenteritis and colitis, unspecified: Secondary | ICD-10-CM | POA: Diagnosis not present

## 2023-01-24 LAB — COMPREHENSIVE METABOLIC PANEL
ALT: 13 U/L (ref 0–44)
AST: 18 U/L (ref 15–41)
Albumin: 3.2 g/dL — ABNORMAL LOW (ref 3.5–5.0)
Alkaline Phosphatase: 30 U/L — ABNORMAL LOW (ref 38–126)
Anion gap: 9 (ref 5–15)
BUN: 14 mg/dL (ref 8–23)
CO2: 22 mmol/L (ref 22–32)
Calcium: 9.4 mg/dL (ref 8.9–10.3)
Chloride: 107 mmol/L (ref 98–111)
Creatinine, Ser: 1.15 mg/dL — ABNORMAL HIGH (ref 0.44–1.00)
GFR, Estimated: 51 mL/min — ABNORMAL LOW (ref >60)
Glucose, Bld: 94 mg/dL (ref 70–99)
Potassium: 4.2 mmol/L (ref 3.5–5.1)
Sodium: 138 mmol/L (ref 135–145)
Total Bilirubin: 0.5 mg/dL (ref 0.3–1.2)
Total Protein: 5.4 g/dL — ABNORMAL LOW (ref 6.5–8.1)

## 2023-01-24 LAB — CBC WITH DIFFERENTIAL/PLATELET
Abs Immature Granulocytes: 0.01 10*3/uL (ref 0.00–0.07)
Basophils Absolute: 0 10*3/uL (ref 0.0–0.1)
Basophils Relative: 1 %
Eosinophils Absolute: 0 10*3/uL (ref 0.0–0.5)
Eosinophils Relative: 0 %
HCT: 34.8 % — ABNORMAL LOW (ref 36.0–46.0)
Hemoglobin: 11.5 g/dL — ABNORMAL LOW (ref 12.0–15.0)
Immature Granulocytes: 0 %
Lymphocytes Relative: 44 %
Lymphs Abs: 2.4 10*3/uL (ref 0.7–4.0)
MCH: 31.9 pg (ref 26.0–34.0)
MCHC: 33 g/dL (ref 30.0–36.0)
MCV: 96.7 fL (ref 80.0–100.0)
Monocytes Absolute: 0.4 10*3/uL (ref 0.1–1.0)
Monocytes Relative: 7 %
Neutro Abs: 2.7 10*3/uL (ref 1.7–7.7)
Neutrophils Relative %: 48 %
Platelets: 159 10*3/uL (ref 150–400)
RBC: 3.6 MIL/uL — ABNORMAL LOW (ref 3.87–5.11)
RDW: 13.2 % (ref 11.5–15.5)
WBC: 5.5 10*3/uL (ref 4.0–10.5)
nRBC: 0 % (ref 0.0–0.2)

## 2023-01-24 LAB — URIC ACID: Uric Acid, Serum: 4 mg/dL (ref 2.5–7.1)

## 2023-01-24 LAB — LIPID PANEL
Cholesterol: 107 mg/dL (ref 0–200)
HDL: 32 mg/dL — ABNORMAL LOW (ref >40)
LDL Cholesterol: 56 mg/dL (ref 0–99)
Total CHOL/HDL Ratio: 3.3 RATIO
Triglycerides: 96 mg/dL (ref <150)
VLDL: 19 mg/dL (ref 0–40)

## 2023-01-24 LAB — PHOSPHORUS: Phosphorus: 3.5 mg/dL (ref 2.5–4.6)

## 2023-01-24 LAB — MAGNESIUM: Magnesium: 1.9 mg/dL (ref 1.7–2.4)

## 2023-01-24 NOTE — Progress Notes (Signed)
Rachel Black ZOX:[id number] DOB: [birth year] DOA: 01/21/2023 PCP: Melida Quitter, PA   Subj: Rachel Black is a 72 y.o. WF PMHx gout, HLD, HTN,, CAD, osteoarthritis, PFO, unspecified CVA, recent admission and discharge from 01/14/2023-01/15/2023 for acute colitis along with small right lower lobe acute PE treated with IV antibiotics along with subcutaneous Lovenox and discharged on oral ciprofloxacin and Flagyl and oral Eliquis   Presented with worsening abdominal pain and diarrhea.  Patient has been having worsening diarrhea for the last few days along with intermittent crampy abdominal pain, mostly in the lower abdomen.  She was seen in the ED for the same reason on 01/18/2023 and discharged on oral Bentyl but patient continued to have loose stools and abdominal pain.  Also complains of nausea but no vomiting.  Patient denies any fever, chest pain, worsening shortness of breath, dysuria, increased frequency of urination, loss of consciousness or seizures.   ED Course: Labs were unremarkable including a negative UA.  CT of abdomen and pelvis with contrast showed sigmoid diverticulitis with mild sigmoid colitis.  She was started on IV Zosyn.   Obj: A/O x 4, states has been eating negative nausea, negative vomiting, negative diarrhea.  Increased Positive abdominal pain (ate fried chicken, rice, peach cobbler).  Negative nausea, negative vomiting,   Objective: VITAL SIGNS: Temp: 98 F (36.7 C) (05/12 0741) BP: 127/63 (05/12 0741) Pulse Rate: 55 (05/12 0741)   VENTILATOR SETTINGS: **  Procedures/Significant Events:    Consultants:     Cultures 5/10 C. difficile antigen/toxin negative   Antimicrobials: Anti-infectives (From admission, onward)    Start     Ordered Stop   01/22/23 1000  piperacillin-tazobactam (ZOSYN) IVPB 3.375 g        01/22/23 0813     01/22/23 0300  piperacillin-tazobactam (ZOSYN) IVPB 3.375 g        01/22/23 0250 01/22/23 0454        Intake/Output  Summary (Last 24 hours) at 01/24/2023 1245 Last data filed at 01/24/2023 0210 Gross per 24 hour  Intake 120 ml  Output --  Net 120 ml     Physical Exam:  General: A/O x 4, No acute respiratory distress Eyes: negative scleral hemorrhage, negative anisocoria, negative icterus ENT: Negative Runny nose, negative gingival bleeding, Neck:  Negative scars, masses, torticollis, lymphadenopathy, JVD Lungs: Clear to auscultation bilaterally without wheezes or crackles Cardiovascular: Regular rate and rhythm without murmur gallop or rub normal S1 and S2 Abdomen: Positive increased pain with palpation LUQ> LLQ abdominal pain, nondistended, positive soft, bowel sounds, no rebound, no ascites, no appreciable mass Extremities: No significant cyanosis, clubbing, or edema bilateral lower extremities Skin: Negative rashes, lesions, ulcers Psychiatric:  Negative depression, negative anxiety, negative fatigue, negative mania  Central nervous system:  Cranial nerves II through XII intact, tongue/uvula midline, all extremities muscle strength 5/5, sensation intact throughout, negative dysarthria, negative expressive aphasia, negative receptive aphasia.    .   DVT prophylaxis: Eliquis Code Status: Full Family Communication:  Status is: Inpatient    Dispo: The patient is from: Home              Anticipated d/c is to: Home              Anticipated d/c date is: > 3 days              Patient currently is not medically stable to d/c.      Assessment & Plan: Covid vaccination;   Principal Problem:  Diverticulitis Active Problems:   Colitis  Acute sigmoid diverticulitis/sigmoid colitis -Out treatment failure - Normal saline 30ml/hr -Zosyn x 7 days minimal -Normal saline 57ml/hr - 5/10 C. difficile antigen/toxin negative -5/12 will hold on transitioning to p.o. antibiotics given patient's increased significant abdominal pain. - 5/12 placed on bland diet, discontinue heart healthy.  If  patient tolerates and abdominal pain resolves will attempt to start p.o. antibiotics in a.m.   Hx RUL PE - Eliquis  Essential HTN -On hold secondary to BP being on low side.  HLD -Continue Crestor.   -5/12 lipid panel= 56 within AHA guideline   Hx gout -Will hold allopurinol for now as patient is having diarrhea. -5/11 uric acid pending  Hypokalemia - Potassium goal> 4 - 5/11 potassium IV 50 mEq     Mobility Assessment (last 72 hours)     Mobility Assessment     Row Name 01/24/23 0800 01/23/23 0830 01/22/23 1300 01/22/23 07:26:16     Does patient have an order for bedrest or is patient medically unstable No - Continue assessment No - Continue assessment No - Continue assessment No - Continue assessment    What is the highest level of mobility based on the progressive mobility assessment? Level 5 (Walks with assist in room/hall) - Balance while stepping forward/back and can walk in room with assist - Complete Level 5 (Walks with assist in room/hall) - Balance while stepping forward/back and can walk in room with assist - Complete Level 5 (Walks with assist in room/hall) - Balance while stepping forward/back and can walk in room with assist - Complete --                  Time: 50 minutes         Care during the described time interval was provided by me .  I have reviewed this patient's available data, including medical history, events of note, physical examination, and all test results as part of my evaluation.

## 2023-01-25 DIAGNOSIS — I10 Essential (primary) hypertension: Secondary | ICD-10-CM | POA: Diagnosis not present

## 2023-01-25 DIAGNOSIS — K529 Noninfective gastroenteritis and colitis, unspecified: Secondary | ICD-10-CM | POA: Diagnosis not present

## 2023-01-25 DIAGNOSIS — E876 Hypokalemia: Secondary | ICD-10-CM

## 2023-01-25 DIAGNOSIS — K5792 Diverticulitis of intestine, part unspecified, without perforation or abscess without bleeding: Secondary | ICD-10-CM | POA: Diagnosis not present

## 2023-01-25 DIAGNOSIS — E785 Hyperlipidemia, unspecified: Secondary | ICD-10-CM | POA: Diagnosis not present

## 2023-01-25 DIAGNOSIS — M109 Gout, unspecified: Secondary | ICD-10-CM

## 2023-01-25 LAB — CBC WITH DIFFERENTIAL/PLATELET
Abs Immature Granulocytes: 0.02 10*3/uL (ref 0.00–0.07)
Basophils Absolute: 0 10*3/uL (ref 0.0–0.1)
Basophils Relative: 1 %
Eosinophils Absolute: 0 10*3/uL (ref 0.0–0.5)
Eosinophils Relative: 1 %
HCT: 36.2 % (ref 36.0–46.0)
Hemoglobin: 11.8 g/dL — ABNORMAL LOW (ref 12.0–15.0)
Immature Granulocytes: 0 %
Lymphocytes Relative: 43 %
Lymphs Abs: 2.6 10*3/uL (ref 0.7–4.0)
MCH: 31.6 pg (ref 26.0–34.0)
MCHC: 32.6 g/dL (ref 30.0–36.0)
MCV: 97.1 fL (ref 80.0–100.0)
Monocytes Absolute: 0.3 10*3/uL (ref 0.1–1.0)
Monocytes Relative: 6 %
Neutro Abs: 2.9 10*3/uL (ref 1.7–7.7)
Neutrophils Relative %: 49 %
Platelets: 160 10*3/uL (ref 150–400)
RBC: 3.73 MIL/uL — ABNORMAL LOW (ref 3.87–5.11)
RDW: 13.1 % (ref 11.5–15.5)
WBC: 5.9 10*3/uL (ref 4.0–10.5)
nRBC: 0 % (ref 0.0–0.2)

## 2023-01-25 LAB — COMPREHENSIVE METABOLIC PANEL
ALT: 16 U/L (ref 0–44)
AST: 18 U/L (ref 15–41)
Albumin: 3.3 g/dL — ABNORMAL LOW (ref 3.5–5.0)
Alkaline Phosphatase: 32 U/L — ABNORMAL LOW (ref 38–126)
Anion gap: 4 — ABNORMAL LOW (ref 5–15)
BUN: 14 mg/dL (ref 8–23)
CO2: 21 mmol/L — ABNORMAL LOW (ref 22–32)
Calcium: 9.1 mg/dL (ref 8.9–10.3)
Chloride: 109 mmol/L (ref 98–111)
Creatinine, Ser: 1.09 mg/dL — ABNORMAL HIGH (ref 0.44–1.00)
GFR, Estimated: 55 mL/min — ABNORMAL LOW (ref >60)
Glucose, Bld: 88 mg/dL (ref 70–99)
Potassium: 3.6 mmol/L (ref 3.5–5.1)
Sodium: 134 mmol/L — ABNORMAL LOW (ref 135–145)
Total Bilirubin: 0.7 mg/dL (ref 0.3–1.2)
Total Protein: 5.5 g/dL — ABNORMAL LOW (ref 6.5–8.1)

## 2023-01-25 LAB — MAGNESIUM: Magnesium: 1.9 mg/dL (ref 1.7–2.4)

## 2023-01-25 LAB — PHOSPHORUS: Phosphorus: 3.6 mg/dL (ref 2.5–4.6)

## 2023-01-25 MED ORDER — SULFAMETHOXAZOLE-TRIMETHOPRIM 800-160 MG PO TABS
1.0000 | ORAL_TABLET | Freq: Two times a day (BID) | ORAL | Status: DC
  Administered 2023-01-25 – 2023-01-26 (×2): 1 via ORAL
  Filled 2023-01-25 (×2): qty 1

## 2023-01-25 MED ORDER — MAGNESIUM SULFATE 2 GM/50ML IV SOLN: 2.0000 g | Freq: Once | INTRAVENOUS | Status: DC

## 2023-01-25 MED ORDER — MAGNESIUM SULFATE IN D5W 1-5 GM/100ML-% IV SOLN
1.0000 g | Freq: Once | INTRAVENOUS | Status: AC
  Administered 2023-01-25: 1 g via INTRAVENOUS
  Filled 2023-01-25: qty 100

## 2023-01-25 NOTE — Progress Notes (Signed)
Initial Nutrition Assessment  DOCUMENTATION CODES:   Not applicable  INTERVENTION:  - Add Banatrol BID.   - Add MVI q day.   NUTRITION DIAGNOSIS:   Inadequate oral intake related to decreased appetite, poor appetite as evidenced by meal completion < 50%.  GOAL:   Patient will meet greater than or equal to 90% of their needs  MONITOR:   PO intake, Supplement acceptance  REASON FOR ASSESSMENT:   Malnutrition Screening Tool    ASSESSMENT:   72 y.o. female admits related to abdominal pain and diarrhea. PMH includes: arthritis, DM, GERD, HLD, HTN, stroke. Pt is currently receiving medical management related to diverticulitis.  Meds reviewed. Labs reviewed: Na low.   The pt reports that she has been eating fair since admission. Per record, pt only ate 25% of her meals yesterday. Pt reports that she is not a big eater at home. No significant wt loss per record. Pt states that she is still having some mild abdominal pain. Pt also states that she is still having some diarrhea. Pt agreed to try Banatrol BID. RD will add supplement and continue to monitor I/Os. Pt did not seem interested in Boost or Ensure at this time. RD briefly explained Soft diet (low fiber) in setting of diverticulitis. Pt verbalized understanding. Will continue to monitor PO intakes.   NUTRITION - FOCUSED PHYSICAL EXAM:  Flowsheet Row Most Recent Value  Orbital Region No depletion  Upper Arm Region No depletion  Thoracic and Lumbar Region No depletion  Buccal Region No depletion  Temple Region Mild depletion  Clavicle Bone Region No depletion  Clavicle and Acromion Bone Region No depletion  Scapular Bone Region No depletion  Dorsal Hand Mild depletion  Patellar Region No depletion  Anterior Thigh Region No depletion  Posterior Calf Region No depletion  Edema (RD Assessment) None  Hair Reviewed  Eyes Reviewed  Mouth Reviewed  Skin Reviewed  Nails Reviewed       Diet Order:   Diet Order              DIET SOFT Room service appropriate? Yes; Fluid consistency: Thin  Diet effective now                   EDUCATION NEEDS:   Not appropriate for education at this time  Skin:  Skin Assessment: Reviewed RN Assessment  Last BM:  5/12 - type 6  Height:   Ht Readings from Last 1 Encounters:  01/22/23 5\' 6"  (1.676 m)    Weight:   Wt Readings from Last 1 Encounters:  01/22/23 80.3 kg    Ideal Body Weight:     BMI:  Body mass index is 28.57 kg/m.  Estimated Nutritional Needs:   Kcal:  1765-2000 kcals  Protein:  90-100 gm  Fluid:  >/= 1.7 L  Bethann Humble, RD, LDN, CNSC.

## 2023-01-25 NOTE — TOC Progression Note (Signed)
Transition of Care [facility]) - Progression Note    Patient Details  Name: Rachel Black MRN: [id number] Date of Birth: Jan 22, 1951  [facility] CM/SW Contact  Gordy Clement, RN Phone Number: 01/25/2023, 1:43 PM  Clinical Narrative:     TOC continuing to follow patient for any DC needs. There are currently no recommendations for TOC. Patient is from home with Spouse .   TOC will continue to follow patient for any additional discharge needs           Expected Discharge Plan and Services                                               Social Determinants of Health (SDOH) Interventions SDOH Screenings   Food Insecurity: No Food Insecurity (01/22/2023)  Housing: Low Risk  (01/22/2023)  Transportation Needs: No Transportation Needs (01/22/2023)  Utilities: Not At Risk (01/22/2023)  Depression (PHQ2-9): Medium Risk (09/28/2022)  Tobacco Use: Low Risk  (01/22/2023)    Readmission Risk Interventions     No data to display

## 2023-01-25 NOTE — Progress Notes (Signed)
Rachel Black NFA:[id number] DOB: August 04, 1951 DOA: 01/21/2023 PCP: Melida Quitter, PA   Subj: Rachel Black is a 72 y.o. WF PMHx gout, HLD, HTN,, CAD, osteoarthritis, PFO, unspecified CVA, recent admission and discharge from 01/14/2023-01/15/2023 for acute colitis along with small right lower lobe acute PE treated with IV antibiotics along with subcutaneous Lovenox and discharged on oral ciprofloxacin and Flagyl and oral Eliquis   Presented with worsening abdominal pain and diarrhea.  Patient has been having worsening diarrhea for the last few days along with intermittent crampy abdominal pain, mostly in the lower abdomen.  She was seen in the ED for the same reason on 01/18/2023 and discharged on oral Bentyl but patient continued to have loose stools and abdominal pain.  Also complains of nausea but no vomiting.  Patient denies any fever, chest pain, worsening shortness of breath, dysuria, increased frequency of urination, loss of consciousness or seizures.   ED Course: Labs were unremarkable including a negative UA.  CT of abdomen and pelvis with contrast showed sigmoid diverticulitis with mild sigmoid colitis.  She was started on IV Zosyn.   Obj: 5/13 afebrile overnight A/O x 4, states since starting the bland diet yesterday still some abdominal pain but significantly improved.    Objective: VITAL SIGNS: Temp: 98.1 F (36.7 C) (05/13 0746) Temp Source: Oral (05/13 0746) BP: 124/65 (05/13 0746) Pulse Rate: 50 (05/13 0746)   VENTILATOR SETTINGS: **  Procedures/Significant Events:    Consultants:     Cultures 5/10 C. difficile antigen/toxin negative   Antimicrobials: Anti-infectives (From admission, onward)    Start     Ordered Stop   01/25/23 1800  sulfamethoxazole-trimethoprim (BACTRIM DS) 800-160 MG per tablet 1 tablet        01/25/23 1128 02/06/23 2159   01/22/23 1000  piperacillin-tazobactam (ZOSYN) IVPB 3.375 g  Status:  Discontinued        01/22/23 0813 01/25/23  1128   01/22/23 0300  piperacillin-tazobactam (ZOSYN) IVPB 3.375 g        01/22/23 0250 01/22/23 0454         Intake/Output Summary (Last 24 hours) at 01/25/2023 0952 Last data filed at 01/24/2023 2200 Gross per 24 hour  Intake 360 ml  Output --  Net 360 ml    Physical Exam:  General: A/O x 4, No acute respiratory distress Eyes: negative scleral hemorrhage, negative anisocoria, negative icterus ENT: Negative Runny nose, negative gingival bleeding, Neck:  Negative scars, masses, torticollis, lymphadenopathy, JVD Lungs: Clear to auscultation bilaterally without wheezes or crackles Cardiovascular: Regular rate and rhythm without murmur gallop or rub normal S1 and S2 Abdomen: Positive mild diffuse abdominal pain to palpation , nondistended, positive soft, bowel sounds, no rebound, no ascites, no appreciable mass Extremities: No significant cyanosis, clubbing, or edema bilateral lower extremities Skin: Negative rashes, lesions, ulcers Psychiatric:  Negative depression, negative anxiety, negative fatigue, negative mania  Central nervous system:  Cranial nerves II through XII intact, tongue/uvula midline, all extremities muscle strength 5/5, sensation intact throughout, negative dysarthria, negative expressive aphasia, negative receptive aphasia.   .   DVT prophylaxis: Eliquis Code Status: Full Family Communication:  Status is: Inpatient    Dispo: The patient is from: Home              Anticipated d/c is to: Home              Anticipated d/c date is: > 3 days  Patient currently is not medically stable to d/c.      Assessment & Plan: Covid vaccination;   Principal Problem:   Diverticulitis Active Problems:   Colitis  Acute sigmoid diverticulitis/sigmoid colitis -Out treatment failure - Normal saline 48ml/hr -Zosyn x 7 days minimal -Normal saline 70ml/hr - 5/10 C. difficile antigen/toxin negative -5/12 will hold on transitioning to p.o. antibiotics given  patient's increased significant abdominal pain. - 5/12 placed on bland diet, discontinue heart healthy.  If patient tolerates and abdominal pain resolves will attempt to start p.o. antibiotics in a.m. -5/13 DC Zosyn - 5/13 Bactrim BID x 12 days.  If patient tolerates overnight discharge in a.m.   Hx RUL PE - Eliquis  Essential HTN -On hold secondary to BP being on low side.  HLD -Crestor 40 mg daily -5/12 lipid panel= 56 within AHA guideline   Hx gout -Will hold allopurinol for now as patient is having diarrhea. -5/12 Uric Acid= 4.0.  At goal of<6  Hypokalemia - Potassium goal> 4 - 5/13 will hold on replacing with patient starting Bactrim.  Will replace prior to discharge if required.  Hypomagnesmia - Magnesium goal> 2 - 5/13 Magnesium IV 2 g     Mobility Assessment (last 72 hours)     Mobility Assessment     Row Name 01/25/23 0800 01/24/23 0800 01/23/23 0830 01/22/23 1300     Does patient have an order for bedrest or is patient medically unstable No - Continue assessment No - Continue assessment No - Continue assessment No - Continue assessment    What is the highest level of mobility based on the progressive mobility assessment? Level 5 (Walks with assist in room/hall) - Balance while stepping forward/back and can walk in room with assist - Complete Level 5 (Walks with assist in room/hall) - Balance while stepping forward/back and can walk in room with assist - Complete Level 5 (Walks with assist in room/hall) - Balance while stepping forward/back and can walk in room with assist - Complete Level 5 (Walks with assist in room/hall) - Balance while stepping forward/back and can walk in room with assist - Complete                  Time: 50 minutes         Care during the described time interval was provided by me .  I have reviewed this patient's available data, including medical history, events of note, physical examination, and all test results as part of my  evaluation.

## 2023-01-26 ENCOUNTER — Encounter: Payer: BC Managed Care – PPO | Admitting: Physical Therapy

## 2023-01-26 ENCOUNTER — Other Ambulatory Visit (HOSPITAL_COMMUNITY): Payer: Self-pay

## 2023-01-26 DIAGNOSIS — K5792 Diverticulitis of intestine, part unspecified, without perforation or abscess without bleeding: Secondary | ICD-10-CM | POA: Diagnosis not present

## 2023-01-26 DIAGNOSIS — K529 Noninfective gastroenteritis and colitis, unspecified: Secondary | ICD-10-CM | POA: Diagnosis not present

## 2023-01-26 DIAGNOSIS — M109 Gout, unspecified: Secondary | ICD-10-CM | POA: Diagnosis not present

## 2023-01-26 LAB — CBC WITH DIFFERENTIAL/PLATELET
Abs Immature Granulocytes: 0.02 10*3/uL (ref 0.00–0.07)
Basophils Absolute: 0 10*3/uL (ref 0.0–0.1)
Basophils Relative: 1 %
Eosinophils Absolute: 0 10*3/uL (ref 0.0–0.5)
Eosinophils Relative: 0 %
HCT: 35.4 % — ABNORMAL LOW (ref 36.0–46.0)
Hemoglobin: 11.6 g/dL — ABNORMAL LOW (ref 12.0–15.0)
Immature Granulocytes: 0 %
Lymphocytes Relative: 38 %
Lymphs Abs: 2.2 10*3/uL (ref 0.7–4.0)
MCH: 32.1 pg (ref 26.0–34.0)
MCHC: 32.8 g/dL (ref 30.0–36.0)
MCV: 98.1 fL (ref 80.0–100.0)
Monocytes Absolute: 0.3 10*3/uL (ref 0.1–1.0)
Monocytes Relative: 6 %
Neutro Abs: 3.2 10*3/uL (ref 1.7–7.7)
Neutrophils Relative %: 55 %
Platelets: 164 10*3/uL (ref 150–400)
RBC: 3.61 MIL/uL — ABNORMAL LOW (ref 3.87–5.11)
RDW: 12.8 % (ref 11.5–15.5)
WBC: 5.8 10*3/uL (ref 4.0–10.5)
nRBC: 0 % (ref 0.0–0.2)

## 2023-01-26 LAB — COMPREHENSIVE METABOLIC PANEL
ALT: 16 U/L (ref 0–44)
AST: 20 U/L (ref 15–41)
Albumin: 3.2 g/dL — ABNORMAL LOW (ref 3.5–5.0)
Alkaline Phosphatase: 31 U/L — ABNORMAL LOW (ref 38–126)
Anion gap: 8 (ref 5–15)
BUN: 12 mg/dL (ref 8–23)
CO2: 21 mmol/L — ABNORMAL LOW (ref 22–32)
Calcium: 9.2 mg/dL (ref 8.9–10.3)
Chloride: 109 mmol/L (ref 98–111)
Creatinine, Ser: 1.05 mg/dL — ABNORMAL HIGH (ref 0.44–1.00)
GFR, Estimated: 57 mL/min — ABNORMAL LOW (ref >60)
Glucose, Bld: 90 mg/dL (ref 70–99)
Potassium: 3.4 mmol/L — ABNORMAL LOW (ref 3.5–5.1)
Sodium: 138 mmol/L (ref 135–145)
Total Bilirubin: 0.3 mg/dL (ref 0.3–1.2)
Total Protein: 5.3 g/dL — ABNORMAL LOW (ref 6.5–8.1)

## 2023-01-26 LAB — PHOSPHORUS: Phosphorus: 3.2 mg/dL (ref 2.5–4.6)

## 2023-01-26 LAB — MAGNESIUM: Magnesium: 2.1 mg/dL (ref 1.7–2.4)

## 2023-01-26 MED ORDER — POTASSIUM CHLORIDE CRYS ER 20 MEQ PO TBCR
40.0000 meq | EXTENDED_RELEASE_TABLET | Freq: Two times a day (BID) | ORAL | Status: DC
  Administered 2023-01-26: 40 meq via ORAL
  Filled 2023-01-26: qty 2

## 2023-01-26 MED ORDER — FENOFIBRATE 54 MG PO TABS: 54.0000 mg | ORAL_TABLET | Freq: Every day | ORAL | 0 refills | Status: DC

## 2023-01-26 MED ORDER — ONDANSETRON HCL 4 MG PO TABS: 4.0000 mg | ORAL_TABLET | Freq: Four times a day (QID) | ORAL | 0 refills | Status: DC | PRN

## 2023-01-26 MED ORDER — SULFAMETHOXAZOLE-TRIMETHOPRIM 800-160 MG PO TABS: 1.0000 | ORAL_TABLET | Freq: Two times a day (BID) | ORAL | 0 refills | Status: DC

## 2023-01-26 MED ORDER — APIXABAN 5 MG PO TABS: 5.0000 mg | ORAL_TABLET | Freq: Two times a day (BID) | ORAL | 0 refills | Status: DC

## 2023-01-26 MED ORDER — APIXABAN 5 MG PO TABS
5.0000 mg | ORAL_TABLET | Freq: Two times a day (BID) | ORAL | 0 refills | Status: DC
  Filled 2023-01-26: qty 60, 30d supply, fill #0

## 2023-01-26 MED ORDER — FENOFIBRATE 54 MG PO TABS
54.0000 mg | ORAL_TABLET | Freq: Every day | ORAL | 0 refills | Status: DC
  Filled 2023-01-26: qty 30, 30d supply, fill #0

## 2023-01-26 MED ORDER — ONDANSETRON HCL 4 MG PO TABS
4.0000 mg | ORAL_TABLET | Freq: Four times a day (QID) | ORAL | 0 refills | Status: DC | PRN
  Filled 2023-01-26: qty 12, 3d supply, fill #0

## 2023-01-26 MED ORDER — SULFAMETHOXAZOLE-TRIMETHOPRIM 800-160 MG PO TABS
1.0000 | ORAL_TABLET | Freq: Two times a day (BID) | ORAL | 0 refills | Status: DC
  Filled 2023-01-26: qty 24, 12d supply, fill #0

## 2023-01-26 NOTE — TOC Transition Note (Signed)
Transition of Care [facility]) - CM/SW Discharge Note   Patient Details  Name: Rachel Black MRN: [id number] Date of Birth: 1951/01/13  [facility] CM/SW Contact:  Kermit Balo, RN Phone Number: 01/26/2023, 10:25 AM   Clinical Narrative:    Pt is discharging home with self care. No needs per TOC.  Eliquis to be filled by [facility] pharmacy and she will get the first 30 days free.    Final next level of care: Home/Self Care Barriers to Discharge: No Barriers Identified   Patient Goals and CMS Choice      Discharge Placement                         Discharge Plan and Services Additional resources added to the After Visit Summary for                                       Social Determinants of Health (SDOH) Interventions SDOH Screenings   Food Insecurity: No Food Insecurity (01/22/2023)  Housing: Low Risk  (01/22/2023)  Transportation Needs: No Transportation Needs (01/22/2023)  Utilities: Not At Risk (01/22/2023)  Depression (PHQ2-9): Medium Risk (09/28/2022)  Tobacco Use: Low Risk  (01/22/2023)     Readmission Risk Interventions     No data to display

## 2023-01-26 NOTE — Discharge Summary (Signed)
Physician Discharge Summary  Rachel Black:[id number] DOB: 1950/12/21 DOA: 01/21/2023  PCP: Melida Quitter, PA  Admit date: 01/21/2023 Discharge date: 01/26/2023  Time spent: 35 minutes  Recommendations for Outpatient Follow-up: Acute sigmoid diverticulitis/sigmoid colitis -Out treatment failure - Normal saline 40ml/hr -Zosyn x 7 days minimal -Normal saline 61ml/hr - 5/10 C. difficile antigen/toxin negative -5/12 will hold on transitioning to p.o. antibiotics given patient's increased significant abdominal pain. - 5/12 placed on bland diet, discontinue heart healthy.  If patient tolerates and abdominal pain resolves will attempt to start p.o. antibiotics in a.m. -5/13 DC Zosyn - 5/13 Bactrim BID x 12 days.  If patient tolerates overnight discharge in a.m.     Hx RUL PE - Eliquis   Essential HTN -On hold secondary to BP being on low side.   HLD -Crestor 40 mg daily -5/12 lipid panel= 56 within AHA guideline   Hx gout -Will hold allopurinol for now as patient is having diarrhea. -5/12 Uric Acid= 4.0.  At goal of<6   Hypokalemia - Potassium goal> 4 - 5/13 will hold on replacing with patient starting Bactrim.  Will replace prior to discharge if required. -5/14 K-Dur 40 mEq x 2 doses prior to discharge   Hypomagnesmia - Magnesium goal> 2 - 5/13 Magnesium IV 2 g  Discharge Diagnoses:  Principal Problem:   Diverticulitis Active Problems:   Colitis   Discharge Condition: Stable  Diet recommendation: [place] [employer] Weights   01/22/23 9562  Weight: 80.3 kg    History of present illness:  Rachel Black is a 72 y.o. WF PMHx gout, HLD, HTN,, CAD, osteoarthritis, PFO, unspecified CVA, recent admission and discharge from 01/14/2023-01/15/2023 for acute colitis along with small right lower lobe acute PE treated with IV antibiotics along with subcutaneous Lovenox and discharged on oral ciprofloxacin and Flagyl and oral Eliquis    Presented with worsening abdominal pain  and diarrhea.  Patient has been having worsening diarrhea for the last few days along with intermittent crampy abdominal pain, mostly in the lower abdomen.  She was seen in the ED for the same reason on 01/18/2023 and discharged on oral Bentyl but patient continued to have loose stools and abdominal pain.  Also complains of nausea but no vomiting.  Patient denies any fever, chest pain, worsening shortness of breath, dysuria, increased frequency of urination, loss of consciousness or seizures.   ED Course: Labs were unremarkable including a negative UA.  CT of abdomen and pelvis with contrast showed sigmoid diverticulitis with mild sigmoid colitis.  She was started on IV Zosyn.  Hospital Course:  See above   Antibiotics Anti-infectives (From admission, onward)    Start     Ordered Stop   01/25/23 1800  sulfamethoxazole-trimethoprim (BACTRIM DS) 800-160 MG per tablet 1 tablet        01/25/23 1128 02/06/23 2159   01/22/23 1000  piperacillin-tazobactam (ZOSYN) IVPB 3.375 g  Status:  Discontinued        01/22/23 0813 01/25/23 1128   01/22/23 0300  piperacillin-tazobactam (ZOSYN) IVPB 3.375 g        01/22/23 0250 01/22/23 0454         Discharge Exam: Vitals:   01/25/23 1613 01/25/23 1940 01/26/23 0459 01/26/23 0749  BP: 132/63 (!) 124/50 121/68 133/88  Pulse: (!) 55 66 (!) 54 (!) 56  Resp: 18 18 18 16   Temp: 98.5 F (36.9 C) 98.9 F (37.2 C) 98 F (36.7 C) 97.9 F (36.6 C)  TempSrc:  Oral  Oral   SpO2: 97% 96% 97% 97%  Weight:      Height:        General: A/O x 4, No acute respiratory distress Eyes: negative scleral hemorrhage, negative anisocoria, negative icterus ENT: Negative Runny nose, negative gingival bleeding, Neck:  Negative scars, masses, torticollis, lymphadenopathy, JVD Lungs: Clear to auscultation bilaterally without wheezes or crackles Cardiovascular: Regular rate and rhythm without murmur gallop or rub normal S1 and S2  Discharge Instructions   Allergies as of  01/26/2023       Reactions   Other Other (See Comments)   Some BANDAIDS cause skin irritation    Penicillins Itching   Tolerated Zosyn couse 10/04/22        Medication List     STOP taking these medications    ciprofloxacin 500 MG tablet Commonly known as: Cipro   Eliquis DVT/PE Starter Pack Generic drug: Apixaban Starter Pack (10mg  and 5mg ) Replaced by: apixaban 5 MG Tabs tablet   Linzess 72 MCG capsule Generic drug: linaclotide   metroNIDAZOLE 500 MG tablet Commonly known as: Flagyl       TAKE these medications    Accu-Chek Aviva Plus test strip Generic drug: glucose blood USE AS INSTRUCTED ONCE A DAY. DX CODE E11.9   Accu-Chek Softclix Lancets lancets Use as instructed once a day.  DX Code: E11.9   acetaminophen 500 MG tablet Commonly known as: TYLENOL Take 1,000 mg by mouth every 6 (six) hours as needed for mild pain or moderate pain.   allopurinol 300 MG tablet Commonly known as: ZYLOPRIM TAKE 1 TABLET BY MOUTH TWICE A DAY   apixaban 5 MG Tabs tablet Commonly known as: ELIQUIS Take 1 tablet (5 mg total) by mouth 2 (two) times daily. Replaces: Eliquis DVT/PE Starter Pack   CALCIUM PO Take 2 tablets by mouth daily.   dicyclomine 20 MG tablet Commonly known as: BENTYL Take 1 tablet (20 mg total) by mouth every 12 (twelve) hours as needed (for abdominal pain/cramping).   famotidine 10 MG tablet Commonly known as: PEPCID Take 10 mg by mouth daily as needed for heartburn or indigestion.   fenofibrate 54 MG tablet Take 1 tablet (54 mg total) by mouth daily. What changed:  medication strength how much to take   ferrous sulfate 325 (65 FE) MG tablet TAKE 1 TABLET BY MOUTH TWICE A DAY What changed: when to take this   gabapentin 300 MG capsule Commonly known as: NEURONTIN Take 1 capsule (300 mg total) by mouth at bedtime.   isosorbide mononitrate 30 MG 24 hr tablet Commonly known as: IMDUR Take 30 mg by mouth daily.   olmesartan 5 MG  tablet Commonly known as: BENICAR Take 1 tablet (5 mg total) by mouth daily.   ondansetron 4 MG tablet Commonly known as: ZOFRAN Take 1 tablet (4 mg total) by mouth every 6 (six) hours as needed for nausea.   pantoprazole 40 MG tablet Commonly known as: PROTONIX Take 40 mg by mouth daily.   rosuvastatin 40 MG tablet Commonly known as: CRESTOR TAKE 1 TABLET BY MOUTH EVERY DAY   sulfamethoxazole-trimethoprim 800-160 MG tablet Commonly known as: BACTRIM DS Take 1 tablet by mouth every 12 (twelve) hours.   Udderly Smooth Crea Apply 1 application topically See admin instructions. Apply topically to skin folds as needed for rash/irritation       Allergies  Allergen Reactions   Other Other (See Comments)    Some BANDAIDS cause skin irritation    Penicillins Itching  Tolerated Zosyn couse 10/04/22    Follow-up Information     Schedule an appointment as soon as possible for a visit  with Melida Quitter, PA.   Specialty: Family Medicine Contact information: [address] [place] [address] [phone]         Schedule an appointment as soon as possible for a visit  with Hilarie Fredrickson, MD.   Specialty: Gastroenterology Contact information: [address] [place] [address] [phone]                  The results of significant diagnostics from this hospitalization (including imaging, microbiology, ancillary and laboratory) are listed below for reference.    Significant Diagnostic Studies: CT ABDOMEN PELVIS W CONTRAST  Result Date: 01/22/2023 CLINICAL DATA:  Left lower quadrant abdominal pain. EXAM: CT ABDOMEN AND PELVIS WITH CONTRAST TECHNIQUE: Multidetector CT imaging of the abdomen and pelvis was performed using the standard protocol following bolus administration of intravenous contrast. RADIATION DOSE REDUCTION: This exam was performed according to the departmental dose-optimization program which includes automated exposure  control, adjustment of the mA and/or kV according to patient size and/or use of iterative reconstruction technique. CONTRAST:  75mL OMNIPAQUE IOHEXOL 350 MG/ML SOLN COMPARISON:  Jan 13, 2023 FINDINGS: Lower chest: No acute abnormality. Hepatobiliary: No focal liver abnormality is seen. A moderate amount of predominant central pneumobilia is seen. This is mildly increased in severity when compared to the prior study. Status post cholecystectomy. No biliary dilatation. Pancreas: Unremarkable. No pancreatic ductal dilatation or surrounding inflammatory changes. Spleen: Normal in size without focal abnormality. Adrenals/Urinary Tract: Adrenal glands are unremarkable. Kidneys are normal in size, without renal calculi or hydronephrosis. An extrarenal pelvis is seen on the right. A stable subcentimeter simple left renal cyst is seen on the left. Bladder is unremarkable. Stomach/Bowel: There is a small hiatal hernia. The appendix is not visualized. No evidence of bowel dilatation. Numerous diverticula are seen throughout the sigmoid colon. Mild thickening of the proximal to mid sigmoid colon is also noted. Vascular/Lymphatic: No significant vascular findings are present. No enlarged abdominal or pelvic lymph nodes. Reproductive: Status post hysterectomy. No adnexal masses. Other: No abdominal wall hernia or abnormality. No abdominopelvic ascites. Musculoskeletal: Multilevel degenerative changes are seen throughout the lumbar spine. IMPRESSION: 1. Sigmoid diverticulitis with additional findings likely consistent with mild sigmoid colitis. 2. Small hiatal hernia. 3. Evidence of prior cholecystectomy and hysterectomy. Electronically Signed   By: Aram Candela M.D.   On: 01/22/2023 01:33   VAS [place] LOWER EXTREMITY VENOUS (DVT)  Result Date: 01/14/2023  Lower Venous DVT Study Patient Name:  KENIYAH HOUCK  Date of Exam:   01/14/2023 Medical Rec #: [id number]        Accession #:    [id number] Date of Birth: 02-Sep-1951         Patient Gender: F Patient Age:   64 years Exam Location:  [facility] Procedure:      VAS [place] LOWER EXTREMITY VENOUS (DVT) Referring Phys: DEBBY CROSLEY --------------------------------------------------------------------------------  Indications: Pain, and Swelling.  Risk Factors: Confirmed PE History of bilateral knee replacements. Comparison Study: No priors. Performing Technologist: Marilynne Halsted RDMS, RVT  Examination Guidelines: A complete evaluation includes B-mode imaging, spectral Doppler, color Doppler, and power Doppler as needed of all accessible portions of each vessel. Bilateral testing is considered an integral part of a complete examination. Limited examinations for reoccurring indications may be performed as noted. The reflux portion of the exam is  performed with the patient in reverse Trendelenburg.  +---------+---------------+---------+-----------+----------+--------------+ RIGHT    CompressibilityPhasicitySpontaneityPropertiesThrombus Aging +---------+---------------+---------+-----------+----------+--------------+ CFV      Full           Yes      Yes                                 +---------+---------------+---------+-----------+----------+--------------+ SFJ      Full                                                        +---------+---------------+---------+-----------+----------+--------------+ FV Prox  Full                                                        +---------+---------------+---------+-----------+----------+--------------+ FV Mid   Full                                                        +---------+---------------+---------+-----------+----------+--------------+ FV DistalFull                                                        +---------+---------------+---------+-----------+----------+--------------+ PFV      Full                                                         +---------+---------------+---------+-----------+----------+--------------+ POP      Full           Yes      Yes                                 +---------+---------------+---------+-----------+----------+--------------+ PTV      Full                                                        +---------+---------------+---------+-----------+----------+--------------+ PERO     Full                                                        +---------+---------------+---------+-----------+----------+--------------+   +---------+---------------+---------+-----------+----------+--------------+ LEFT     CompressibilityPhasicitySpontaneityPropertiesThrombus Aging +---------+---------------+---------+-----------+----------+--------------+ CFV      Full           Yes      Yes                                 +---------+---------------+---------+-----------+----------+--------------+  SFJ      Full                                                        +---------+---------------+---------+-----------+----------+--------------+ FV Prox  Full                                                        +---------+---------------+---------+-----------+----------+--------------+ FV Mid   Full                                                        +---------+---------------+---------+-----------+----------+--------------+ FV DistalFull                                                        +---------+---------------+---------+-----------+----------+--------------+ PFV      Full                                                        +---------+---------------+---------+-----------+----------+--------------+ POP      Full           Yes      Yes                                 +---------+---------------+---------+-----------+----------+--------------+ PTV      Full                                                         +---------+---------------+---------+-----------+----------+--------------+ PERO     Full                                                        +---------+---------------+---------+-----------+----------+--------------+     Summary: BILATERAL: - No evidence of deep vein thrombosis seen in the lower extremities, bilaterally. -No evidence of popliteal cyst, bilaterally.   *See table(s) above for measurements and observations. Electronically signed by Gerarda Fraction on 01/14/2023 at 5:17:20 PM.    Final    CT ABDOMEN PELVIS W CONTRAST  Result Date: 01/13/2023 CLINICAL DATA:  Abdominal pain and diarrhea. EXAM: CT ABDOMEN AND PELVIS WITH CONTRAST TECHNIQUE: Multidetector CT imaging of the abdomen and pelvis was performed using the standard protocol following bolus administration of intravenous contrast. RADIATION DOSE REDUCTION: This exam was performed according to the departmental dose-optimization program which includes automated exposure  control, adjustment of the mA and/or kV according to patient size and/or use of iterative reconstruction technique. CONTRAST:  75mL OMNIPAQUE IOHEXOL 350 MG/ML SOLN COMPARISON:  None Available. FINDINGS: Lower chest: There are findings suspicious for subsegmental right lower lobe pulmonary embolism on image 3/9. Lung bases are otherwise clear. Hepatobiliary: Patient is status post cholecystectomy. Pneumobilia is present. No biliary ductal dilatation. No focal liver lesions. Pancreas: Unremarkable. No pancreatic ductal dilatation or surrounding inflammatory changes. Spleen: Normal in size without focal abnormality. Adrenals/Urinary Tract: Subcentimeter left cortical renal cyst. Otherwise, kidneys, adrenal glands and bladder are within normal limits. Stomach/Bowel: There is sigmoid colon diverticulosis. No focal inflammation identified. There is questionable mild wall thickening of the descending and sigmoid colon. There is no bowel obstruction, pneumatosis or free air. The  appendix is not seen. There is a small hiatal hernia. Otherwise, stomach and small bowel are within normal limits. Vascular/Lymphatic: No significant vascular findings are present. No enlarged abdominal or pelvic lymph nodes. Reproductive: Status post hysterectomy. No adnexal masses. Other: There is scarring in the anterior abdominal wall. No evidence for hernia or ascites. Musculoskeletal: There are degenerative changes at L3-L4. IMPRESSION: 1. Subsegmental right lower lobe pulmonary embolism. 2. Questionable mild wall thickening of the descending and sigmoid colon which may represent mild colitis. 3. Sigmoid colon diverticulosis without evidence for acute diverticulitis. 4. Cholecystectomy with pneumobilia. 5. Small hiatal hernia. 6. Subcentimeter left Bosniak I benign renal cyst. No follow-up imaging is recommended. JACR 2018 Feb; 264-273, Management of the Incidental Renal Mass on CT, [employer] 2021; 814-848, Bosniak Classification of Cystic Renal Masses, Version 2019. These results were called by telephone at the time of interpretation on 01/13/2023 at 11:56 pm to provider Vivien Rossetti , who verbally acknowledged these results. Electronically Signed   By: Darliss Cheney M.D.   On: 01/13/2023 23:56    Microbiology: Recent Results (from the past 240 hour(s))  C Difficile Quick Screen w PCR reflex     Status: None   Collection Time: 01/22/23  2:30 PM   Specimen: STOOL  Result Value Ref Range Status   C Diff antigen NEGATIVE NEGATIVE Final   C Diff toxin NEGATIVE NEGATIVE Final   C Diff interpretation No C. difficile detected.  Final    Comment: Performed at [facility], [address], [place], [place] [address]  Gastrointestinal Panel by PCR , Stool     Status: None   Collection Time: 01/22/23  2:30 PM   Specimen: Stool  Result Value Ref Range Status   Campylobacter species NOT DETECTED NOT DETECTED Final   Plesimonas shigelloides NOT DETECTED NOT DETECTED Final   Salmonella species  NOT DETECTED NOT DETECTED Final   Yersinia enterocolitica NOT DETECTED NOT DETECTED Final   Vibrio species NOT DETECTED NOT DETECTED Final   Vibrio cholerae NOT DETECTED NOT DETECTED Final   Enteroaggregative E coli (EAEC) NOT DETECTED NOT DETECTED Final   Enteropathogenic E coli (EPEC) NOT DETECTED NOT DETECTED Final   Enterotoxigenic E coli (ETEC) NOT DETECTED NOT DETECTED Final   Shiga like toxin producing E coli (STEC) NOT DETECTED NOT DETECTED Final   Shigella/Enteroinvasive E coli (EIEC) NOT DETECTED NOT DETECTED Final   Cryptosporidium NOT DETECTED NOT DETECTED Final   Cyclospora cayetanensis NOT DETECTED NOT DETECTED Final   Entamoeba histolytica NOT DETECTED NOT DETECTED Final   Giardia lamblia NOT DETECTED NOT DETECTED Final   Adenovirus F40/41 NOT DETECTED NOT DETECTED Final   Astrovirus NOT DETECTED NOT DETECTED Final   Norovirus  GI/GII NOT DETECTED NOT DETECTED Final   Rotavirus A NOT DETECTED NOT DETECTED Final   Sapovirus (I, II, IV, and V) NOT DETECTED NOT DETECTED Final    Comment: Performed at [facility], [address] [place], [place] [address]     Labs: Basic Metabolic Panel: Recent Labs  Lab 01/21/23 1831 01/23/23 0347 01/24/23 0501 01/25/23 0439 01/26/23 0655  NA 137 137 138 134* 138  K 4.4 3.4* 4.2 3.6 3.4*  CL 105 106 107 109 109  CO2 21* 22 22 21* 21*  GLUCOSE 102* 95 94 88 90  BUN 21 11 14 14 12   CREATININE 1.06* 1.00 1.15* 1.09* 1.05*  CALCIUM 9.2 9.1 9.4 9.1 9.2  MG  --  1.9 1.9 1.9 2.1  PHOS  --   --  3.5 3.6 3.2   Liver Function Tests: Recent Labs  Lab 01/21/23 1831 01/23/23 0347 01/24/23 0501 01/25/23 0439 01/26/23 0655  AST 23 17 18 18 20   ALT 18 16 13 16 16   ALKPHOS 34* 31* 30* 32* 31*  BILITOT 0.6 0.7 0.5 0.7 0.3  PROT 6.0* 5.3* 5.4* 5.5* 5.3*  ALBUMIN 3.9 3.2* 3.2* 3.3* 3.2*   Recent Labs  Lab 01/21/23 1831  LIPASE 56*   No results for input(s): "AMMONIA" in the last 168 hours. CBC: Recent Labs  Lab  01/21/23 1831 01/23/23 0347 01/24/23 0501 01/25/23 0439 01/26/23 0655  WBC 5.3 4.6 5.5 5.9 5.8  NEUTROABS  --   --  2.7 2.9 3.2  HGB 12.4 11.6* 11.5* 11.8* 11.6*  HCT 39.0 35.3* 34.8* 36.2 35.4*  MCV 100.5* 98.1 96.7 97.1 98.1  PLT 190 164 159 160 164   Cardiac Enzymes: No results for input(s): "CKTOTAL", "CKMB", "CKMBINDEX", "TROPONINI" in the last 168 hours. BNP: BNP (last 3 results) No results for input(s): "BNP" in the last 8760 hours.  ProBNP (last 3 results) No results for input(s): "PROBNP" in the last 8760 hours.  CBG: No results for input(s): "GLUCAP" in the last 168 hours.     Signed:  Carolyne Littles, MD [facility]

## 2023-01-27 ENCOUNTER — Inpatient Hospital Stay (HOSPITAL_COMMUNITY)
Admission: EM | Admit: 2023-01-27 | Discharge: 2023-01-31 | DRG: 392 | Disposition: A | Discharge status: Home Health | Payer: BC Managed Care – PPO | Attending: Internal Medicine | Admitting: Internal Medicine

## 2023-01-27 ENCOUNTER — Encounter (HOSPITAL_COMMUNITY): Payer: Self-pay | Admitting: Internal Medicine

## 2023-01-27 ENCOUNTER — Emergency Department (HOSPITAL_COMMUNITY): Payer: BC Managed Care – PPO

## 2023-01-27 ENCOUNTER — Other Ambulatory Visit: Payer: Self-pay

## 2023-01-27 ENCOUNTER — Telehealth: Payer: Self-pay

## 2023-01-27 DIAGNOSIS — I2510 Atherosclerotic heart disease of native coronary artery without angina pectoris: Secondary | ICD-10-CM | POA: Diagnosis present

## 2023-01-27 DIAGNOSIS — E860 Dehydration: Secondary | ICD-10-CM | POA: Diagnosis present

## 2023-01-27 DIAGNOSIS — E785 Hyperlipidemia, unspecified: Secondary | ICD-10-CM | POA: Diagnosis present

## 2023-01-27 DIAGNOSIS — Z86711 Personal history of pulmonary embolism: Secondary | ICD-10-CM | POA: Diagnosis not present

## 2023-01-27 DIAGNOSIS — Z8673 Personal history of transient ischemic attack (TIA), and cerebral infarction without residual deficits: Secondary | ICD-10-CM | POA: Diagnosis not present

## 2023-01-27 DIAGNOSIS — N179 Acute kidney failure, unspecified: Secondary | ICD-10-CM | POA: Diagnosis present

## 2023-01-27 DIAGNOSIS — Z8619 Personal history of other infectious and parasitic diseases: Secondary | ICD-10-CM | POA: Diagnosis not present

## 2023-01-27 DIAGNOSIS — R634 Abnormal weight loss: Secondary | ICD-10-CM | POA: Diagnosis present

## 2023-01-27 DIAGNOSIS — F32A Depression, unspecified: Secondary | ICD-10-CM | POA: Diagnosis present

## 2023-01-27 DIAGNOSIS — Z7901 Long term (current) use of anticoagulants: Secondary | ICD-10-CM | POA: Diagnosis not present

## 2023-01-27 DIAGNOSIS — K219 Gastro-esophageal reflux disease without esophagitis: Secondary | ICD-10-CM | POA: Diagnosis present

## 2023-01-27 DIAGNOSIS — Z8249 Family history of ischemic heart disease and other diseases of the circulatory system: Secondary | ICD-10-CM

## 2023-01-27 DIAGNOSIS — Z79899 Other long term (current) drug therapy: Secondary | ICD-10-CM

## 2023-01-27 DIAGNOSIS — M109 Gout, unspecified: Secondary | ICD-10-CM | POA: Diagnosis present

## 2023-01-27 DIAGNOSIS — K5792 Diverticulitis of intestine, part unspecified, without perforation or abscess without bleeding: Secondary | ICD-10-CM | POA: Diagnosis present

## 2023-01-27 DIAGNOSIS — Z6828 Body mass index (BMI) 28.0-28.9, adult: Secondary | ICD-10-CM

## 2023-01-27 DIAGNOSIS — F410 Panic disorder [episodic paroxysmal anxiety] without agoraphobia: Secondary | ICD-10-CM | POA: Diagnosis present

## 2023-01-27 DIAGNOSIS — F419 Anxiety disorder, unspecified: Secondary | ICD-10-CM | POA: Insufficient documentation

## 2023-01-27 DIAGNOSIS — I252 Old myocardial infarction: Secondary | ICD-10-CM

## 2023-01-27 DIAGNOSIS — Z880 Allergy status to penicillin: Secondary | ICD-10-CM

## 2023-01-27 DIAGNOSIS — Z90711 Acquired absence of uterus with remaining cervical stump: Secondary | ICD-10-CM

## 2023-01-27 DIAGNOSIS — E119 Type 2 diabetes mellitus without complications: Secondary | ICD-10-CM | POA: Diagnosis present

## 2023-01-27 DIAGNOSIS — K589 Irritable bowel syndrome without diarrhea: Secondary | ICD-10-CM | POA: Diagnosis present

## 2023-01-27 DIAGNOSIS — Z91048 Other nonmedicinal substance allergy status: Secondary | ICD-10-CM

## 2023-01-27 DIAGNOSIS — Z96653 Presence of artificial knee joint, bilateral: Secondary | ICD-10-CM | POA: Diagnosis present

## 2023-01-27 DIAGNOSIS — E1169 Type 2 diabetes mellitus with other specified complication: Secondary | ICD-10-CM | POA: Diagnosis not present

## 2023-01-27 DIAGNOSIS — Z833 Family history of diabetes mellitus: Secondary | ICD-10-CM

## 2023-01-27 DIAGNOSIS — M170 Bilateral primary osteoarthritis of knee: Secondary | ICD-10-CM | POA: Diagnosis present

## 2023-01-27 DIAGNOSIS — I10 Essential (primary) hypertension: Secondary | ICD-10-CM | POA: Diagnosis present

## 2023-01-27 DIAGNOSIS — Z9049 Acquired absence of other specified parts of digestive tract: Secondary | ICD-10-CM

## 2023-01-27 DIAGNOSIS — R1084 Generalized abdominal pain: Principal | ICD-10-CM

## 2023-01-27 DIAGNOSIS — R002 Palpitations: Secondary | ICD-10-CM | POA: Diagnosis present

## 2023-01-27 HISTORY — DX: Diverticulitis of intestine, part unspecified, without perforation or abscess without bleeding: K57.92

## 2023-01-27 LAB — COMPREHENSIVE METABOLIC PANEL
ALT: 17 U/L (ref 0–44)
AST: 19 U/L (ref 15–41)
Albumin: 3.8 g/dL (ref 3.5–5.0)
Alkaline Phosphatase: 37 U/L — ABNORMAL LOW (ref 38–126)
Anion gap: 11 (ref 5–15)
BUN: 18 mg/dL (ref 8–23)
CO2: 17 mmol/L — ABNORMAL LOW (ref 22–32)
Calcium: 9.9 mg/dL (ref 8.9–10.3)
Chloride: 110 mmol/L (ref 98–111)
Creatinine, Ser: 1.64 mg/dL — ABNORMAL HIGH (ref 0.44–1.00)
GFR, Estimated: 33 mL/min — ABNORMAL LOW (ref >60)
Glucose, Bld: 91 mg/dL (ref 70–99)
Potassium: 4.3 mmol/L (ref 3.5–5.1)
Sodium: 138 mmol/L (ref 135–145)
Total Bilirubin: 0.5 mg/dL (ref 0.3–1.2)
Total Protein: 6.1 g/dL — ABNORMAL LOW (ref 6.5–8.1)

## 2023-01-27 LAB — CBC WITH DIFFERENTIAL/PLATELET
Abs Immature Granulocytes: 0.02 10*3/uL (ref 0.00–0.07)
Basophils Absolute: 0 10*3/uL (ref 0.0–0.1)
Basophils Relative: 1 %
Eosinophils Absolute: 0 10*3/uL (ref 0.0–0.5)
Eosinophils Relative: 0 %
HCT: 37.8 % (ref 36.0–46.0)
Hemoglobin: 12.5 g/dL (ref 12.0–15.0)
Immature Granulocytes: 0 %
Lymphocytes Relative: 33 %
Lymphs Abs: 2.1 10*3/uL (ref 0.7–4.0)
MCH: 32.5 pg (ref 26.0–34.0)
MCHC: 33.1 g/dL (ref 30.0–36.0)
MCV: 98.2 fL (ref 80.0–100.0)
Monocytes Absolute: 0.3 10*3/uL (ref 0.1–1.0)
Monocytes Relative: 5 %
Neutro Abs: 3.9 10*3/uL (ref 1.7–7.7)
Neutrophils Relative %: 61 %
Platelets: 168 10*3/uL (ref 150–400)
RBC: 3.85 MIL/uL — ABNORMAL LOW (ref 3.87–5.11)
RDW: 13.1 % (ref 11.5–15.5)
WBC: 6.4 10*3/uL (ref 4.0–10.5)
nRBC: 0 % (ref 0.0–0.2)

## 2023-01-27 LAB — CREATININE, SERUM
Creatinine, Ser: 1.6 mg/dL — ABNORMAL HIGH (ref 0.44–1.00)
GFR, Estimated: 34 mL/min — ABNORMAL LOW (ref >60)

## 2023-01-27 LAB — CBC
HCT: 39.2 % (ref 36.0–46.0)
Hemoglobin: 12.7 g/dL (ref 12.0–15.0)
MCH: 32.2 pg (ref 26.0–34.0)
MCHC: 32.4 g/dL (ref 30.0–36.0)
MCV: 99.5 fL (ref 80.0–100.0)
Platelets: 170 10*3/uL (ref 150–400)
RBC: 3.94 MIL/uL (ref 3.87–5.11)
RDW: 13 % (ref 11.5–15.5)
WBC: 6.3 10*3/uL (ref 4.0–10.5)
nRBC: 0 % (ref 0.0–0.2)

## 2023-01-27 LAB — LIPASE, BLOOD: Lipase: 52 U/L — ABNORMAL HIGH (ref 11–51)

## 2023-01-27 MED ORDER — FENTANYL CITRATE PF 50 MCG/ML IJ SOSY
50.0000 ug | PREFILLED_SYRINGE | Freq: Once | INTRAMUSCULAR | Status: AC
  Administered 2023-01-27: 50 ug via INTRAVENOUS
  Filled 2023-01-27: qty 1

## 2023-01-27 MED ORDER — SODIUM CHLORIDE 0.9 % IV BOLUS
1000.0000 mL | Freq: Once | INTRAVENOUS | Status: AC
  Administered 2023-01-27: 1000 mL via INTRAVENOUS

## 2023-01-27 MED ORDER — ENOXAPARIN SODIUM 30 MG/0.3ML IJ SOSY: 30.0000 mg | PREFILLED_SYRINGE | INTRAMUSCULAR | Status: DC

## 2023-01-27 MED ORDER — LACTATED RINGERS IV SOLN: INTRAVENOUS | Status: DC

## 2023-01-27 MED ORDER — ONDANSETRON HCL 4 MG/2ML IJ SOLN
4.0000 mg | Freq: Once | INTRAMUSCULAR | Status: AC
  Administered 2023-01-27: 4 mg via INTRAVENOUS
  Filled 2023-01-27: qty 2

## 2023-01-27 MED ORDER — HYDROMORPHONE HCL 1 MG/ML IJ SOLN
0.5000 mg | INTRAMUSCULAR | Status: DC | PRN
  Administered 2023-01-28 – 2023-01-30 (×5): 0.5 mg via INTRAVENOUS
  Filled 2023-01-27 (×5): qty 1

## 2023-01-27 NOTE — Transitions of Care (Post Inpatient/ED Visit) (Signed)
01/27/2023  Name: Rachel Black MRN: [id number] DOB: [birth year]  Today's TOC FU Call Status: Today's TOC FU Call Status:: Successful TOC FU Call Competed TOC FU Call Complete Date: 01/27/23  Transition Care Management Follow-up Telephone Call Date of Discharge: 01/26/23 Discharge Facility: [facility]) Type of Discharge: Inpatient Admission Primary Inpatient Discharge Diagnosis:: diverticlitis How have you been since you were released from the hospital?: Same Any questions or concerns?: No  Items Reviewed: Did you receive and understand the discharge instructions provided?: Yes Medications obtained,verified, and reconciled?: Yes (Medications Reviewed) Any new allergies since your discharge?: No Dietary orders reviewed?: NA Do you have support at home?: Yes People in Home: spouse  Medications Reviewed Today: Medications Reviewed Today     Reviewed by Annabell Sabal, CMA (Certified Medical Assistant) on 01/27/23 at 1337  Med List Status: <None>   Medication Order Taking? Sig Documenting Provider Last Dose Status Informant  ACCU-CHEK AVIVA PLUS test strip [id number] Yes USE AS INSTRUCTED ONCE A DAY. DX CODE E11.9 Mayer Masker, PA-C Taking Active Spouse/Significant Other  Accu-Chek Softclix Lancets lancets [id number] Yes Use as instructed once a day.  DX Code: E11.9 Mayer Masker, PA-C Taking Active Spouse/Significant Other  acetaminophen (TYLENOL) 500 MG tablet [id number] Yes Take 1,000 mg by mouth every 6 (six) hours as needed for mild pain or moderate pain. [provider] Taking Active Spouse/Significant Other  allopurinol (ZYLOPRIM) 300 MG tablet [id number] Yes TAKE 1 TABLET BY MOUTH TWICE A DAY Boscia, Heather E, NP Taking Active Spouse/Significant Other  apixaban (ELIQUIS) 5 MG TABS tablet [id number] Yes Take 1 tablet (5 mg total) by mouth 2 (two) times daily. Drema Dallas, MD Taking Active   CALCIUM PO [id number] Yes Take 2 tablets by mouth daily. [provider] Taking Active Spouse/Significant Other  dicyclomine (BENTYL) 20 MG tablet [id number] Yes Take 1 tablet (20 mg total) by mouth every 12 (twelve) hours as needed (for abdominal pain/cramping). Antony Madura, PA-C Taking Active Spouse/Significant Other  Emollient (UDDERLY SMOOTH) CREA [id number] Yes Apply 1 application topically See admin instructions. Apply topically to skin folds as needed for rash/irritation [provider] Taking Active Spouse/Significant Other  famotidine (PEPCID) 10 MG tablet [id number] Yes Take 10 mg by mouth daily as needed for heartburn or indigestion. [provider] Taking Active Spouse/Significant Other  fenofibrate 54 MG tablet [id number] Yes Take 1 tablet (54 mg total) by mouth daily. Drema Dallas, MD Taking Active   ferrous sulfate 325 (65 FE) MG tablet [id number] Yes TAKE 1 TABLET BY MOUTH TWICE A DAY  Patient taking differently: Take 325 mg by mouth daily.   Zehr, Princella Pellegrini, PA-C Taking Active Spouse/Significant Other  gabapentin (NEURONTIN) 300 MG capsule [id number] Yes Take 1 capsule (300 mg total) by mouth at bedtime. Kathryne Hitch, MD Taking Active Spouse/Significant Other  isosorbide mononitrate (IMDUR) 30 MG 24 hr tablet [id number] Yes Take 30 mg by mouth daily. [provider] Taking Active Spouse/Significant Other  olmesartan (BENICAR) 5 MG tablet [id number] Yes Take 1 tablet (5 mg total) by mouth daily. Carlean Jews, NP Taking Active Spouse/Significant Other  ondansetron (ZOFRAN) 4 MG tablet [id number] Yes Take 1 tablet (4 mg total) by mouth every 6 (six) hours as needed for nausea. Drema Dallas, MD Taking Active   pantoprazole (PROTONIX) 40 MG tablet [id number] Yes Take 40 mg by mouth daily. [provider] Taking Active Spouse/Significant Other  rosuvastatin (CRESTOR) 40 MG tablet [id number] Yes TAKE 1 TABLET BY MOUTH EVERY  Kenn File, MD Taking Active Spouse/Significant Other   sulfamethoxazole-trimethoprim (BACTRIM DS) 800-160 MG tablet [id number] Yes Take 1 tablet by mouth every 12 (twelve) hours. Drema Dallas, MD Taking Active             Home Care and Equipment/Supplies: Were Home Health Services Ordered?: No Any new equipment or medical supplies ordered?: No  Functional Questionnaire: Do you need assistance with bathing/showering or dressing?: No Do you need assistance with meal preparation?: No Do you need assistance with eating?: No Do you have difficulty maintaining continence: No Do you need assistance with getting out of bed/getting out of a chair/moving?: No Do you have difficulty managing or taking your medications?: No  Follow up appointments reviewed: PCP Follow-up appointment confirmed?: Yes Date of PCP follow-up appointment?: 02/02/23 Follow-up Provider: [facility] Follow-up appointment confirmed?: No Reason Specialist Follow-Up Not Confirmed: Patient has Specialist Provider Number and will Call for Appointment Do you need transportation to your follow-up appointment?: No Do you understand care options if your condition(s) worsen?: Yes-patient verbalized understanding    SIGNATURE Finnley Cuny,CMA [facility] ,[facility]

## 2023-01-27 NOTE — Care Management Important Message (Signed)
Important Message  Patient Details  Name: Rachel Black MRN: [id number] Date of Birth: [birth year]   Medicare Important Message Given:  Yes     [name] Stefan Church 01/27/2023, 8:39 AM

## 2023-01-27 NOTE — ED Notes (Signed)
ED TO INPATIENT HANDOFF REPORT  ED Nurse Name and Phone #: [phone]  S Name/Age/Gender Rachel Black 71 y.o. female Room/Bed: 007C/007C  Code Status   Code Status: Full Code  Home/SNF/Other Home Patient oriented to: self, place, time, and situation Is this baseline? Yes   Triage Complete: Triage complete  Chief Complaint Diverticulitis [K57.92]  Triage Note Pt arrives with c/o ABD pain that started today. Pt has hx of diverticulitis and was recently discharged for the same. Pt endorses nausea and diarrhea. Pt denies vomiting, bloody stool, or fevers.    Allergies Allergies  Allergen Reactions   Other Other (See Comments)    Some BANDAIDS cause skin irritation    Penicillins Itching     Tolerated Zosyn couse 10/04/22    Level of Care/Admitting Diagnosis ED Disposition     ED Disposition  Admit   Condition  --   Comment  Hospital Area: [facility] [[id number]]  Level of Care: Med-Surg [16]  May admit patient to [facility] or [facility] if equivalent level of care is available:: Yes  Covid Evaluation: Asymptomatic - no recent exposure (last 10 days) testing not required  Diagnosis: Diverticulitis [960454]  Admitting Physician: Buena Irish [[id number]]  Attending Physician: Buena Irish [phone]  Certification:: I certify this patient will need inpatient services for at least 2 midnights  Estimated Length of Stay: 4          B Medical/Surgery History Past Medical History:  Diagnosis Date   Arthritis    PAIN AND OA BOTH KNEES AND SHOULDERS AND ELBOWS AND WRIST   Blood transfusion without reported diagnosis 2018   after cholecystectomy   Broken foot, right, closed, initial encounter 09/15/2021   no surgery needed, fell at home and went to ED   Diabetes mellitus    ORAL MEDICATION   GERD (gastroesophageal reflux disease)    Gout    NO RECENT FLARE UPS   H/O: rheumatic fever    AS A CHILD - NO KNOWN HEART MURMUR OR HEART PROBLEMS    Heart attack (HCC)    Hyperlipidemia    Hypertension    PFO (patent foramen ovale)    Stroke (HCC) 03/2017   Past Surgical History:  Procedure Laterality Date   ABDOMINAL HYSTERECTOMY     CESAREAN SECTION     CHOLECYSTECTOMY N/A 12/09/2016   Procedure: LAPAROSCOPIC CHOLECYSTECTOMY WITH INTRAOPERATIVE CHOLANGIOGRAM;  Surgeon: Almond Lint, MD;  Location: [facility];  Service: General;  Laterality: N/A;   COLONOSCOPY     COLONOSCOPY WITH PROPOFOL N/A 01/30/2013   Procedure: COLONOSCOPY WITH PROPOFOL;  Surgeon: Hilarie Fredrickson, MD;  Location: [facility];  Service: Endoscopy;  Laterality: N/A;   ERCP N/A 12/08/2016   Procedure: ENDOSCOPIC RETROGRADE CHOLANGIOPANCREATOGRAPHY (ERCP);  Surgeon: Meryl Dare, MD;  Location: [facility];  Service: Endoscopy;  Laterality: N/A;   ESOPHAGOGASTRODUODENOSCOPY (EGD) WITH PROPOFOL N/A 01/30/2013   Procedure: ESOPHAGOGASTRODUODENOSCOPY (EGD) WITH PROPOFOL;  Surgeon: Hilarie Fredrickson, MD;  Location: [facility];  Service: Endoscopy;  Laterality: N/A;   LEFT HEART CATH AND CORONARY ANGIOGRAPHY N/A 10/18/2017   Procedure: LEFT HEART CATH AND CORONARY ANGIOGRAPHY;  Surgeon: Swaziland, Peter M, MD;  Location: [facility];  Service: Cardiovascular;  Laterality: N/A;   LOOP RECORDER INSERTION N/A 03/18/2017   Procedure: Loop Recorder Insertion;  Surgeon: Regan Lemming, MD;  Location: [facility] CV [facility];  Service: Cardiovascular;  Laterality: N/A;   PARTIAL HYSTERECTOMY     TEE WITHOUT CARDIOVERSION N/A  03/16/2017   Procedure: TRANSESOPHAGEAL ECHOCARDIOGRAM (TEE);  Surgeon: Elease Hashimoto Deloris Ping, MD;  Location: [facility] ENDOSCOPY;  Service: Cardiovascular;  Laterality: N/A;   TOTAL KNEE ARTHROPLASTY Right 04/20/2014   Procedure: RIGHT TOTAL KNEE ARTHROPLASTY CONVERTED TO RIGHT KNEE REIMPLANTATION;  Surgeon: Kathryne Hitch, MD;  Location: [facility];  Service: Orthopedics;  Laterality: Right;   TOTAL KNEE ARTHROPLASTY Left 08/23/2015   Procedure: LEFT TOTAL KNEE  ARTHROPLASTY;  Surgeon: Kathryne Hitch, MD;  Location: [facility];  Service: Orthopedics;  Laterality: Left;   VENTRAL HERNIA REPAIR     x2     A IV Location/Drains/Wounds Patient Lines/Drains/Airways Status     Active Line/Drains/Airways     Name Placement date Placement time Site Days   Peripheral IV 01/27/23 20 G Right Antecubital 01/27/23  2110  Antecubital  less than 1            Intake/Output Last 24 hours No intake or output data in the 24 hours ending 01/27/23 2206  Labs/Imaging Results for orders placed or performed during the hospital encounter of 01/27/23 (from the past 48 hour(s))  CBC with Differential     Status: Abnormal   Collection Time: 01/27/23  6:09 PM  Result Value Ref Range   WBC 6.4 4.0 - 10.5 K/uL   RBC 3.85 (L) 3.87 - 5.11 MIL/uL   Hemoglobin 12.5 12.0 - 15.0 g/dL   HCT 82.9 56.2 - 13.0 %   MCV 98.2 80.0 - 100.0 fL   MCH 32.5 26.0 - 34.0 pg   MCHC 33.1 30.0 - 36.0 g/dL   RDW 86.5 78.4 - 69.6 %   Platelets 168 150 - 400 K/uL   nRBC 0.0 0.0 - 0.2 %   Neutrophils Relative % 61 %   Neutro Abs 3.9 1.7 - 7.7 K/uL   Lymphocytes Relative 33 %   Lymphs Abs 2.1 0.7 - 4.0 K/uL   Monocytes Relative 5 %   Monocytes Absolute 0.3 0.1 - 1.0 K/uL   Eosinophils Relative 0 %   Eosinophils Absolute 0.0 0.0 - 0.5 K/uL   Basophils Relative 1 %   Basophils Absolute 0.0 0.0 - 0.1 K/uL   Immature Granulocytes 0 %   Abs Immature Granulocytes 0.02 0.00 - 0.07 K/uL    Comment: Performed at [facility], [address], [place], [place] [address]  Comprehensive metabolic panel     Status: Abnormal   Collection Time: 01/27/23  6:09 PM  Result Value Ref Range   Sodium 138 135 - 145 mmol/L   Potassium 4.3 3.5 - 5.1 mmol/L   Chloride 110 98 - 111 mmol/L   CO2 17 (L) 22 - 32 mmol/L   Glucose, Bld 91 70 - 99 mg/dL    Comment: Glucose reference range applies only to samples taken after fasting for at least 8 hours.   BUN 18 8 - 23 mg/dL   Creatinine, Ser  4.13 (H) 0.44 - 1.00 mg/dL   Calcium 9.9 8.9 - 24.4 mg/dL   Total Protein 6.1 (L) 6.5 - 8.1 g/dL   Albumin 3.8 3.5 - 5.0 g/dL   AST 19 15 - 41 U/L   ALT 17 0 - 44 U/L   Alkaline Phosphatase 37 (L) 38 - 126 U/L   Total Bilirubin 0.5 0.3 - 1.2 mg/dL   GFR, Estimated 33 (L) >60 mL/min    Comment: (NOTE) Calculated using the CKD-EPI Creatinine Equation (2021)    Anion gap 11 5 - 15    Comment:  Performed at [facility], [address], [place], [place] [address]  Lipase, blood     Status: Abnormal   Collection Time: 01/27/23  6:09 PM  Result Value Ref Range   Lipase 52 (H) 11 - 51 U/L    Comment: Performed at [facility], [address], [place], [place] [address]   CT ABDOMEN PELVIS WO CONTRAST  Result Date: 01/27/2023 CLINICAL DATA:  Acute abdominal pain EXAM: CT ABDOMEN AND PELVIS WITHOUT CONTRAST TECHNIQUE: Multidetector CT imaging of the abdomen and pelvis was performed following the standard protocol without IV contrast. RADIATION DOSE REDUCTION: This exam was performed according to the departmental dose-optimization program which includes automated exposure control, adjustment of the mA and/or kV according to patient size and/or use of iterative reconstruction technique. COMPARISON:  CT abdomen and pelvis 01/22/2023 FINDINGS: Lower chest: No acute abnormality. Hepatobiliary: Gallbladder is surgically absent. Pneumobilia again noted. No focal liver lesions are seen. Pancreas: Unremarkable. No pancreatic ductal dilatation or surrounding inflammatory changes. Spleen: Normal in size without focal abnormality. Adrenals/Urinary Tract: Rounded hypodensity measuring 14 mm in the left kidney is unchanged. Otherwise, the kidneys, bladder and adrenal glands are within normal limits. Stomach/Bowel: There is a small hiatal hernia. Stomach is within normal limits. Appendix is not seen. No evidence of bowel wall thickening, distention, or inflammatory changes. Vascular/Lymphatic: No  significant vascular findings are present. No enlarged abdominal or pelvic lymph nodes. Reproductive: Status post hysterectomy. No adnexal masses. Other: There is a tiny fat containing midline ventral hernia superior to the umbilicus, unchanged. Anterior abdominal wall mesh is present. There is no ascites. Musculoskeletal: Degenerative changes affect the spine. IMPRESSION: 1. No acute localizing process in the abdomen or pelvis. 2. Small hiatal hernia. 3. Cholecystectomy with pneumobilia. Electronically Signed   By: Darliss Cheney M.D.   On: 01/27/2023 21:51    Pending Labs Unresulted Labs (From admission, onward)     Start     Ordered   02/03/23 0500  Creatinine, serum  (enoxaparin (LOVENOX)    CrCl < 30 ml/min)  Once,   R       Comments: while on enoxaparin therapy.    01/27/23 2156   01/28/23 0500  CBC  Tomorrow morning,   R        01/27/23 2156   01/28/23 0500  Comprehensive metabolic panel  Tomorrow morning,   R        01/27/23 2156   01/28/23 0500  Magnesium  Tomorrow morning,   R        01/27/23 2156   01/27/23 2153  CBC  (enoxaparin (LOVENOX)    CrCl < 30 ml/min)  Once,   R       Comments: Baseline for enoxaparin therapy IF NOT ALREADY DRAWN.  Notify MD if PLT < 100 K.    01/27/23 2156   01/27/23 2153  Creatinine, serum  (enoxaparin (LOVENOX)    CrCl < 30 ml/min)  Once,   R       Comments: Baseline for enoxaparin therapy IF NOT ALREADY DRAWN.    01/27/23 2156            Vitals/Pain Today's Vitals   01/27/23 1752 01/27/23 1801  BP: 113/71   Pulse: 82   Resp: 17   Temp: 99.2 F (37.3 C)   TempSrc: Oral   SpO2: 99%   Weight:  80.3 kg  PainSc:  7     Isolation Precautions No active isolations  Medications Medications  fentaNYL (SUBLIMAZE) injection 50 mcg (has no administration in time range)  enoxaparin (LOVENOX) injection 30 mg (has no administration in time range)  HYDROmorphone (DILAUDID) injection 0.5 mg (has no administration in time range)  lactated  ringers infusion (has no administration in time range)  sodium chloride 0.9 % bolus 1,000 mL (1,000 mLs Intravenous New Bag/Given 01/27/23 2112)  ondansetron (ZOFRAN) injection 4 mg (4 mg Intravenous Given 01/27/23 2111)    Mobility walks     Focused Assessments    R Recommendations: See Admitting Provider Note  Report given to:   Additional Notes:

## 2023-01-27 NOTE — ED Triage Notes (Signed)
Pt arrives with c/o ABD pain that started today. Pt has hx of diverticulitis and was recently discharged for the same. Pt endorses nausea and diarrhea. Pt denies vomiting, bloody stool, or fevers.

## 2023-01-27 NOTE — ED Provider Triage Note (Signed)
Emergency Medicine Provider Triage Evaluation Note  Rachel Black , a 72 y.o. female  was evaluated in triage.  Pt complains of discharge from the hospital yesterday for a recent admission due to diverticulitis. She reports pain has been similar as when she was hospitalized with diverticulitis.  She has had a small amount of oral intake today, reports pain has not worsened  To the ED with a low-grade temp of 99.2.  Review of Systems  Positive: Abdominal pain, nausea, fever Negative: vomiting  Physical Exam  BP 113/71 (BP Location: Right Arm)   Pulse 82   Temp 99.2 F (37.3 C) (Oral)   Resp 17   Wt 80.3 kg   SpO2 99%   BMI 28.57 kg/m  Gen:   Awake, no distress   Resp:  Normal effort  MSK:   Moves extremities without difficulty  Other:    Medical Decision Making  Medically screening exam initiated at 6:04 PM.  Appropriate orders placed.  Rachel Black was informed that the remainder of the evaluation will be completed by another provider, this initial triage assessment does not replace that evaluation, and the importance of remaining in the ED until their evaluation is complete.     Claude Manges, PA-C 01/27/23 1610

## 2023-01-27 NOTE — ED Provider Notes (Signed)
[place] EMERGENCY DEPARTMENT AT [facility] Provider Note   CSN: [id number] Arrival date & time: 01/27/23  1743     History Diverticulitis Chief Complaint  Patient presents with  . Abdominal Pain    Rachel Black is a 72 y.o. female.  72 y.o female with a PMH diverticulitis presents to the ED with a chief complaint of generalized abdominal pain x today. Patient was discharged from the hospital yesterday after admitted for diverticulitis, reports after getting home yesterday she felt fine, however, today she started to feel generalized abdominal pain.  She has had a peanut butter and jelly sandwich this morning around 11 AM, feels somewhat nauseated and has has had some dry heaving but no vomiting.  She was discharged on oral antibiotic as she reports she has been taken.  No fever, no vomiting, no other complaints.   The history is provided by the patient.  Abdominal Pain Associated symptoms: nausea   Associated symptoms: no chest pain, no chills, no fever and no shortness of breath        Home Medications Prior to Admission medications   Medication Sig Start Date End Date Taking? Authorizing Provider  ACCU-CHEK AVIVA PLUS test strip USE AS INSTRUCTED ONCE A DAY. DX CODE E11.9 07/20/22   Mayer Masker, PA-C  Accu-Chek Softclix Lancets lancets Use as instructed once a day.  DX Code: E11.9 03/24/22   Mayer Masker, PA-C  acetaminophen (TYLENOL) 500 MG tablet Take 1,000 mg by mouth every 6 (six) hours as needed for mild pain or moderate pain.    [provider]  allopurinol (ZYLOPRIM) 300 MG tablet TAKE 1 TABLET BY MOUTH TWICE A DAY 08/27/22   Carlean Jews, NP  apixaban (ELIQUIS) 5 MG TABS tablet Take 1 tablet (5 mg total) by mouth 2 (two) times daily. 01/26/23   Drema Dallas, MD  CALCIUM PO Take 2 tablets by mouth daily.    [provider]  dicyclomine (BENTYL) 20 MG tablet Take 1 tablet (20 mg total) by mouth every 12 (twelve) hours as  needed (for abdominal pain/cramping). 01/18/23   Antony Madura, PA-C  Emollient (UDDERLY SMOOTH) CREA Apply 1 application topically See admin instructions. Apply topically to skin folds as needed for rash/irritation    [provider]  famotidine (PEPCID) 10 MG tablet Take 10 mg by mouth daily as needed for heartburn or indigestion.    [provider]  fenofibrate 54 MG tablet Take 1 tablet (54 mg total) by mouth daily. 01/26/23   Drema Dallas, MD  ferrous sulfate 325 (65 FE) MG tablet TAKE 1 TABLET BY MOUTH TWICE A DAY Patient taking differently: Take 325 mg by mouth daily. 08/03/22   Zehr, Princella Pellegrini, PA-C  gabapentin (NEURONTIN) 300 MG capsule Take 1 capsule (300 mg total) by mouth at bedtime. 12/21/22   Kathryne Hitch, MD  isosorbide mononitrate (IMDUR) 30 MG 24 hr tablet Take 30 mg by mouth daily. 11/30/22   [provider]  olmesartan (BENICAR) 5 MG tablet Take 1 tablet (5 mg total) by mouth daily. 10/23/22   Carlean Jews, NP  ondansetron (ZOFRAN) 4 MG tablet Take 1 tablet (4 mg total) by mouth every 6 (six) hours as needed for nausea. 01/26/23   Drema Dallas, MD  pantoprazole (PROTONIX) 40 MG tablet Take 40 mg by mouth daily. 12/26/22   [provider]  rosuvastatin (CRESTOR) 40 MG tablet TAKE 1 TABLET BY MOUTH EVERY DAY 01/04/23   Hochrein,  Fayrene Fearing, MD  sulfamethoxazole-trimethoprim (BACTRIM DS) 800-160 MG tablet Take 1 tablet by mouth every 12 (twelve) hours. 01/26/23   Drema Dallas, MD      Allergies    Other and Penicillins    Review of Systems   Review of Systems  Constitutional:  Negative for chills and fever.  Respiratory:  Negative for shortness of breath.   Cardiovascular:  Negative for chest pain.  Gastrointestinal:  Positive for abdominal pain and nausea.  Genitourinary:  Negative for difficulty urinating and flank pain.  All other systems reviewed and are negative.   Physical Exam Updated Vital Signs BP 113/71 (BP Location:  Right Arm)   Pulse 82   Temp 99.2 F (37.3 C) (Oral)   Resp 17   Wt 80.3 kg   SpO2 99%   BMI 28.57 kg/m  Physical Exam Vitals and nursing note reviewed.  Constitutional:      General: She is not in acute distress.    Appearance: She is well-developed.  HENT:     Head: Normocephalic and atraumatic.     Mouth/Throat:     Pharynx: No oropharyngeal exudate.  Eyes:     Pupils: Pupils are equal, round, and reactive to light.  Cardiovascular:     Rate and Rhythm: Regular rhythm.     Heart sounds: Normal heart sounds.  Pulmonary:     Effort: Pulmonary effort is normal. No respiratory distress.     Breath sounds: Normal breath sounds.  Abdominal:     General: Bowel sounds are normal. There is no distension.     Palpations: Abdomen is soft.     Tenderness: There is generalized abdominal tenderness. There is no right CVA tenderness or left CVA tenderness.  Musculoskeletal:        General: No tenderness or deformity.     Cervical back: Normal range of motion.     Right lower leg: No edema.     Left lower leg: No edema.  Skin:    General: Skin is warm and dry.  Neurological:     Mental Status: She is alert and oriented to person, place, and time.     ED Results / Procedures / Treatments   Labs (all labs ordered are listed, but only abnormal results are displayed) Labs Reviewed  CBC WITH DIFFERENTIAL/PLATELET - Abnormal; Notable for the following components:      Result Value   RBC 3.85 (*)    All other components within normal limits  COMPREHENSIVE METABOLIC PANEL - Abnormal; Notable for the following components:   CO2 17 (*)    Creatinine, Ser 1.64 (*)    Total Protein 6.1 (*)    Alkaline Phosphatase 37 (*)    GFR, Estimated 33 (*)    All other components within normal limits  LIPASE, BLOOD - Abnormal; Notable for the following components:   Lipase 52 (*)    All other components within normal limits    EKG None  Radiology No results  found.  Procedures Procedures    Medications Ordered in ED Medications  fentaNYL (SUBLIMAZE) injection 50 mcg (has no administration in time range)  sodium chloride 0.9 % bolus 1,000 mL (1,000 mLs Intravenous New Bag/Given 01/27/23 2112)  ondansetron (ZOFRAN) injection 4 mg (4 mg Intravenous Given 01/27/23 2111)    ED Course/ Medical Decision Making/ A&P Clinical Course as of 01/27/23 2150  Wed Jan 27, 2023  2150 Lipase(!): 52 [JS]  2150 Creatinine(!): 1.64 [JS]    Clinical Course User Index [  JS] Claude Manges, PA-C                             Medical Decision Making Amount and/or Complexity of Data Reviewed Labs: ordered. Radiology: ordered.  Risk Prescription drug management.   This patient presents to the ED for concern of abdominal pain, this involves a number of treatment options, and is a complaint that carries with it a high risk of complications and morbidity.  The differential diagnosis includes recurrent diverticulitis, diverticular abscess, bowel obstruction.    Co morbidities: Discussed in HPI   Brief History:  See HPI.   EMR reviewed including pt PMHx, past surgical history and past visits to ER.   See HPI for more details   Lab Tests:  I ordered and independently interpreted labs.  The pertinent results include:    Labs notable for CBC with no leukocytosis, hemoglobin is unremarkable.  CMP with no electrolyte derangement, creatinine level is bumped at 1.6, significant elevated from her prior including hospital labs and which was around 1.  GFR has also was worsened to 33.  Lipase level is elevated at 52.   Imaging Studies:  CT Abdomen/pelvis is pending.    Medicines ordered:  I ordered medication including zofran,bolus  for symptomatic treatment  Reevaluation of the patient after these medicines showed that the patient improved I have reviewed the patients home medicines and have made adjustments as needed  Reevaluation:  After the  interventions noted above I re-evaluated patient and found that they have :stayed the same   Social Determinants of Health:  .The patient's social determinants of health were a factor in the care of this patient    Problem List / ED Course:  Patient here with worsening pain today, discharge from the hospital yesterday after diagnosis diverticulitis, feels that she cannot keep anything down and has been having dry heaving.  Blood work today is worsened, with her kidney function elevated at 1.6 along with her GFR also trending lower.  She is unable to tolerate contrast on today's visit.  Of concern for worsening diverticulitis.  She did report having oral antibiotics prescribed to her which she has been taken.  She did arrive to the ED with a low-grade temp of 99.2, has not had any vomiting here, given 1 L fluid.  I do feel the patient needs admission back into the hospital.  She remains hemodynamically stable for admission.   Dispostion:  After consideration of the diagnostic results and the patients response to treatment, I feel that the patent would benefit from readmission for further management.    Portions of this note were generated with Scientist, clinical (histocompatibility and immunogenetics). Dictation errors may occur despite best attempts at proofreading.  Final Clinical Impression(s) / ED Diagnoses Final diagnoses:  Generalized abdominal pain  AKI (acute kidney injury) [facility])    Rx / DC Orders ED Discharge Orders     None         Claude Manges, Cordelia Poche 01/27/23 2150    Cathren Laine, MD 01/28/23 1739

## 2023-01-28 ENCOUNTER — Encounter (HOSPITAL_COMMUNITY): Payer: Self-pay | Admitting: Internal Medicine

## 2023-01-28 ENCOUNTER — Encounter: Payer: BC Managed Care – PPO | Admitting: Rehabilitative and Restorative Service Providers"

## 2023-01-28 DIAGNOSIS — K5792 Diverticulitis of intestine, part unspecified, without perforation or abscess without bleeding: Secondary | ICD-10-CM

## 2023-01-28 DIAGNOSIS — E1169 Type 2 diabetes mellitus with other specified complication: Secondary | ICD-10-CM

## 2023-01-28 DIAGNOSIS — I2510 Atherosclerotic heart disease of native coronary artery without angina pectoris: Secondary | ICD-10-CM

## 2023-01-28 DIAGNOSIS — I10 Essential (primary) hypertension: Secondary | ICD-10-CM

## 2023-01-28 LAB — CBC
HCT: 35.3 % — ABNORMAL LOW (ref 36.0–46.0)
Hemoglobin: 11.4 g/dL — ABNORMAL LOW (ref 12.0–15.0)
MCH: 31.7 pg (ref 26.0–34.0)
MCHC: 32.3 g/dL (ref 30.0–36.0)
MCV: 98.1 fL (ref 80.0–100.0)
Platelets: 145 10*3/uL — ABNORMAL LOW (ref 150–400)
RBC: 3.6 MIL/uL — ABNORMAL LOW (ref 3.87–5.11)
RDW: 12.9 % (ref 11.5–15.5)
WBC: 5.8 10*3/uL (ref 4.0–10.5)
nRBC: 0 % (ref 0.0–0.2)

## 2023-01-28 LAB — COMPREHENSIVE METABOLIC PANEL
ALT: 14 U/L (ref 0–44)
AST: 17 U/L (ref 15–41)
Albumin: 3.2 g/dL — ABNORMAL LOW (ref 3.5–5.0)
Alkaline Phosphatase: 32 U/L — ABNORMAL LOW (ref 38–126)
Anion gap: 10 (ref 5–15)
BUN: 17 mg/dL (ref 8–23)
CO2: 20 mmol/L — ABNORMAL LOW (ref 22–32)
Calcium: 9.3 mg/dL (ref 8.9–10.3)
Chloride: 108 mmol/L (ref 98–111)
Creatinine, Ser: 1.46 mg/dL — ABNORMAL HIGH (ref 0.44–1.00)
GFR, Estimated: 38 mL/min — ABNORMAL LOW (ref >60)
Glucose, Bld: 77 mg/dL (ref 70–99)
Potassium: 4.2 mmol/L (ref 3.5–5.1)
Sodium: 138 mmol/L (ref 135–145)
Total Bilirubin: 0.7 mg/dL (ref 0.3–1.2)
Total Protein: 5.3 g/dL — ABNORMAL LOW (ref 6.5–8.1)

## 2023-01-28 LAB — MAGNESIUM: Magnesium: 2 mg/dL (ref 1.7–2.4)

## 2023-01-28 MED ORDER — ISOSORBIDE MONONITRATE ER 30 MG PO TB24
30.0000 mg | ORAL_TABLET | Freq: Every day | ORAL | Status: DC
  Administered 2023-01-28 – 2023-01-31 (×4): 30 mg via ORAL
  Filled 2023-01-28 (×4): qty 1

## 2023-01-28 MED ORDER — ALLOPURINOL 300 MG PO TABS
300.0000 mg | ORAL_TABLET | Freq: Two times a day (BID) | ORAL | Status: DC
  Administered 2023-01-28 – 2023-01-31 (×7): 300 mg via ORAL
  Filled 2023-01-28 (×7): qty 1

## 2023-01-28 MED ORDER — ROSUVASTATIN CALCIUM 20 MG PO TABS
40.0000 mg | ORAL_TABLET | Freq: Every day | ORAL | Status: DC
  Administered 2023-01-28 – 2023-01-31 (×4): 40 mg via ORAL
  Filled 2023-01-28 (×4): qty 2

## 2023-01-28 MED ORDER — PIPERACILLIN-TAZOBACTAM 3.375 G IVPB
3.3750 g | Freq: Three times a day (TID) | INTRAVENOUS | Status: DC
  Administered 2023-01-28 – 2023-01-29 (×4): 3.375 g via INTRAVENOUS
  Filled 2023-01-28 (×4): qty 50

## 2023-01-28 MED ORDER — PANTOPRAZOLE SODIUM 40 MG PO TBEC
40.0000 mg | DELAYED_RELEASE_TABLET | Freq: Every day | ORAL | Status: DC
  Administered 2023-01-28 – 2023-01-31 (×4): 40 mg via ORAL
  Filled 2023-01-28 (×4): qty 1

## 2023-01-28 MED ORDER — APIXABAN 5 MG PO TABS
5.0000 mg | ORAL_TABLET | Freq: Two times a day (BID) | ORAL | Status: DC
  Administered 2023-01-28 – 2023-01-31 (×7): 5 mg via ORAL
  Filled 2023-01-28 (×7): qty 1

## 2023-01-28 MED ORDER — FENOFIBRATE 54 MG PO TABS
54.0000 mg | ORAL_TABLET | Freq: Every day | ORAL | Status: DC
  Administered 2023-01-28 – 2023-01-31 (×4): 54 mg via ORAL
  Filled 2023-01-28 (×4): qty 1

## 2023-01-28 MED ORDER — GABAPENTIN 300 MG PO CAPS
300.0000 mg | ORAL_CAPSULE | Freq: Every day | ORAL | Status: DC
  Administered 2023-01-28 – 2023-01-30 (×3): 300 mg via ORAL
  Filled 2023-01-28 (×3): qty 1

## 2023-01-28 MED ORDER — PIPERACILLIN-TAZOBACTAM IN DEX 2-0.25 GM/50ML IV SOLN: 2.2500 g | Freq: Two times a day (BID) | INTRAVENOUS | Status: DC

## 2023-01-28 MED ORDER — ACETAMINOPHEN 325 MG PO TABS
650.0000 mg | ORAL_TABLET | Freq: Four times a day (QID) | ORAL | Status: DC | PRN
  Administered 2023-01-28 – 2023-01-29 (×3): 650 mg via ORAL
  Filled 2023-01-28 (×4): qty 2

## 2023-01-28 MED ORDER — APIXABAN 5 MG PO TABS: 5.0000 mg | ORAL_TABLET | Freq: Two times a day (BID) | ORAL | Status: DC

## 2023-01-28 NOTE — Progress Notes (Signed)
PROGRESS NOTE  Rachel Black  DOB: 1950-11-21  PCP: Melida Quitter, [place] ZOX:[id number]  DOA: 01/27/2023  LOS: 1 day  Hospital Day: 2  Brief narrative: Rachel Black is a 72 y.o. female with PMH significant for DM2, HTN, HLD, history of rheumatic fever as a child, CAD, stroke, GERD, gout, PFO, recent PE Patient has been hospitalized 3 times since jan 2024, last hospitalization 5/10 to 5/14 for acute sigmoid diverticulitis/sigmoid colitis as evidenced on CT abdomen that failed treatment as an outpatient.  Patient was initially started on IV Zosyn.  Patient reports her abdominal pain eased off while on IV antibiotics and she was able to tolerate oral intake and hence discharged on oral Bactrim. However within 48 hours of discharge, patient reported recurrence of pain and hence returned to the ED.  In the ED, patient was afebrile, heart rate 82, blood pressure 113/71, breathing on room air Initial labs with unremarkable CBC, creatinine elevated to 1.64, lipase 52 CT abdomen pelvis without any localizing acute finding. Started on IV Zosyn and admitted to [facility].  Subjective: Patient was seen and examined this morning.  Pleasant, elderly female.  Not in distress.  Husband at bedside. Abdominal pain persists. Vital signs stable Repeat labs this morning with WBC count normal, creatinine slightly better at 1.46  Assessment and plan: Recurrent Abdominal pain  Recently treated with acute diverticulitis with IV Zosyn and discharged on oral Bactrim.  Readmitted for worsening abdominal pain but no findings of recurrent colitis on CT scan.   Currently on IV Zosyn. Continue clear liquid diet.  Continue gentle IV. Abdominal tenderness seem to be improving.  Continue as needed pain meds. Patient needs to follow-up with general surgery as an outpatient for consideration of colon resection  AKI Baseline creatinine less than 1.2.  Presented with creatinine elevated 1.64.Marland Kitchen  Continue IV hydration.    Recent Labs    01/14/23 0511 01/15/23 0434 01/17/23 1924 01/21/23 1831 01/23/23 0347 01/24/23 0501 01/25/23 0439 01/26/23 0655 01/27/23 1809 01/27/23 2258 01/28/23 0125  BUN 12 12 20 21 11 14 14 12 18   --  17  CREATININE 0.86 1.07* 1.10* 1.06* 1.00 1.15* 1.09* 1.05* 1.64* 1.60* 1.46*   Unintentional 70lb weight loss  GI follow up   H/o PE  Continue Eliquis  Type 2 diabetes mellitus A1c 5.6 on 124 PTA on not on meds Currently not on meds  Hypertension Continue Imdur  CAD Stroke HLD Continue Eliquis, fenofibrate  GERD Continue Protonix  Gout Allopurinol  Recent PE Continue Eliquis   Mobility: Encourage ambulation  Goals of care   Code Status: Full Code     DVT prophylaxis:   apixaban (ELIQUIS) tablet 5 mg   Antimicrobials: IV Zosyn Fluid: Currently on LR at 150 mill per hour.  Reduce to 75 mill per hour. Consultants: Curbside discussed with general surgery Family Communication: Husband at bedside  Status: Inpatient Level of care:  Med-Surg   Patient from: Home Anticipated d/c to: Home Needs to continue in-hospital care:  Monitor for next 24 hours for tolerance    Diet:  Diet Order             Diet full liquid Room service appropriate? Yes; Fluid consistency: Thin  Diet effective now                   Scheduled Meds:  allopurinol  300 mg Oral BID   apixaban  5 mg Oral BID   fenofibrate  54 mg Oral Daily  gabapentin  300 mg Oral QHS   isosorbide mononitrate  30 mg Oral Daily   pantoprazole  40 mg Oral Daily   rosuvastatin  40 mg Oral Daily    PRN meds: HYDROmorphone (DILAUDID) injection   Infusions:   lactated ringers 150 mL/hr at 01/28/23 1205   piperacillin-tazobactam (ZOSYN)  IV 3.375 g (01/28/23 1320)    Antimicrobials: Anti-infectives (From admission, onward)    Start     Dose/Rate Route Frequency Ordered Stop   01/28/23 1000  piperacillin-tazobactam (ZOSYN) IVPB 2.25 g  Status:  Discontinued       Note to  Pharmacy: Please dose   2.25 g 100 mL/hr over 30 Minutes Intravenous Every 12 hours 01/28/23 0350 01/28/23 0354   01/28/23 0445  piperacillin-tazobactam (ZOSYN) IVPB 3.375 g        3.375 g 12.5 mL/hr over 240 Minutes Intravenous Every 8 hours 01/28/23 0354         Nutritional status:  Body mass index is 29.57 kg/m.          Objective: Vitals:   01/28/23 0351 01/28/23 0834  BP: (!) 139/58 (!) 109/42  Pulse: (!) 59 (!) 57  Resp: 18 14  Temp: 97.8 F (36.6 C) 98.7 F (37.1 C)  SpO2: 99% 97%    Intake/Output Summary (Last 24 hours) at 01/28/2023 1433 Last data filed at 01/28/2023 1205 Gross per 24 hour  Intake 260 ml  Output 1100 ml  Net -840 ml   Filed Weights   01/27/23 1801 01/27/23 2257  Weight: 80.3 kg 83.1 kg   Weight change:  Body mass index is 29.57 kg/m.   Physical Exam: General exam: Pleasant, elderly.  Mild abdominal pain Skin: No rashes, lesions or ulcers. HEENT: Atraumatic, normocephalic, no obvious bleeding Lungs: Clear to auscultation bilaterally CVS: Regular rate and rhythm, no murmur GI/Abd soft, mild lower abdominal and left lower quadrant tenderness, nondistended, bowel sound present CNS: Alert, awake, oriented x 3 Psychiatry: Sad affect Extremities: No pedal edema, no calf tenderness  Data Review: I have personally reviewed the laboratory data and studies available.  F/u labs ordered Unresulted Labs (From admission, onward)     Start     Ordered   01/29/23 0500  CBC with Differential/Platelet  Tomorrow morning,   R       Question:  Specimen collection method  Answer:  Lab=Lab collect   01/28/23 1433   01/29/23 0500  Basic metabolic panel  Tomorrow morning,   R       Question:  Specimen collection method  Answer:  Lab=Lab collect   01/28/23 1433            Total time spent in review of labs and imaging, patient evaluation, formulation of plan, documentation and communication with family: 45 minutes  Signed, Lorin Glass,  MD [facility] 01/28/2023

## 2023-01-28 NOTE — Progress Notes (Signed)
Pharmacy Antibiotic Note  Rachel Black is a 72 y.o. female admitted on 01/27/2023 with  intra-abd infection presumed due to diverticulitis .  Pt was on Zosyn during last admission 5/10-5/14 then Bactrim since 5/14. Now back with abd pain and diarrhea.Pharmacy has been consulted for Zosyn dosing.  Plan: Zosyn 3.375gm IV q8h Will f/u renal function, micro data, and pt's clinical condition  Height: 5\' 6"  (167.6 cm) Weight: 83.1 kg (183 lb 3.2 oz) IBW/kg (Calculated) : 59.3  Temp (24hrs), Avg:98.8 F (37.1 C), Min:98.4 F (36.9 C), Max:99.2 F (37.3 C)  Recent Labs  Lab 01/25/23 0439 01/26/23 0655 01/27/23 1809 01/27/23 2258 01/28/23 0125  WBC 5.9 5.8 6.4 6.3 5.8  CREATININE 1.09* 1.05* 1.64* 1.60* 1.46*    Estimated Creatinine Clearance: 38.9 mL/min (A) (by C-G formula based on SCr of 1.46 mg/dL (H)).    Allergies  Allergen Reactions   Other Other (See Comments)    Some BANDAIDS cause skin irritation    Penicillins Itching     Tolerated Zosyn couse 10/04/22    Antimicrobials this admission: 5/16 Zosyn >>    Thank you for allowing pharmacy to be a part of this patient's care.  Christoper Fabian, PharmD, BCPS Please see amion for complete clinical pharmacist phone list 01/28/2023 3:50 AM

## 2023-01-28 NOTE — H&P (Addendum)
History and Physical    Patient: Rachel Black ZOX:[id number] DOB: 08-02-1951 DOA: 01/27/2023 DOS: the patient was seen and examined on 01/28/2023 PCP: Melida Quitter, PA  Patient coming from: Home  Chief Complaint:  Chief Complaint  Patient presents with   Abdominal Pain   HPI: Rachel Black is a 72 y.o. female with medical history significant for hypertension, coronary artery disease, gout, PFO, recent PE, hospitalization from 1/ 20 - 23 for diverticulitis, ED visit 1/30 for abdominal pain, admitted 5/ 2- 3 for acute colitis, then hospitalization 5/10 - /5/14 for colitis and diverticulitis, now back for recurrent abdominal pain one day after discharge.  The patient reports that her abdominal pain had eased off quite a bit and she was able to eat without increased pain prior to discharge.  Her diarrhea also had resolved.  Yesterday after being discharged the patient was able to eat a cheese sandwich without any difficulty.  Today she had a peanut butter sandwich and about an hour and a half later she started to have recurrent abdominal pain.  That was followed by recurrent vomiting and heaves.  He only vomited up since a little liquid, nothing solid but she knew this was not headed in the right direction so she came back in for evaluation.  The patient did take the Bactrim that was prescribed.  She did also have another episode of loose stools this morning.  He denies any fever or shaking chills. In the emergency department her symptoms were treated and she had a CT scan with no contrast because her creatinine was elevated the CT was unremarkable but the patient is being readmitted for IV antibiotics and monitoring. The patient does say that she is concerned because she has had a lot of bowel trouble and this is her 4th hospitalization in 5 months for the same thing.  She follows w Dr. Marina Goodell with GI and has had colonoscopy and endoscopy    Review of Systems: As mentioned in the history of  present illness. All other systems reviewed and are negative. Past Medical History:  Diagnosis Date   Arthritis    PAIN AND OA BOTH KNEES AND SHOULDERS AND ELBOWS AND WRIST   Blood transfusion without reported diagnosis 2018   after cholecystectomy   Broken foot, right, closed, initial encounter 09/15/2021   no surgery needed, fell at home and went to ED   Diabetes mellitus    ORAL MEDICATION   Diverticulitis    GERD (gastroesophageal reflux disease)    Gout    NO RECENT FLARE UPS   H/O: rheumatic fever    AS A CHILD - NO KNOWN HEART MURMUR OR HEART PROBLEMS   Heart attack (HCC)    Hyperlipidemia    Hypertension    PFO (patent foramen ovale)    Stroke (HCC) 03/2017   Past Surgical History:  Procedure Laterality Date   ABDOMINAL HYSTERECTOMY     CESAREAN SECTION     CHOLECYSTECTOMY N/A 12/09/2016   Procedure: LAPAROSCOPIC CHOLECYSTECTOMY WITH INTRAOPERATIVE CHOLANGIOGRAM;  Surgeon: Almond Lint, MD;  Location: [facility];  Service: General;  Laterality: N/A;   COLONOSCOPY     COLONOSCOPY WITH PROPOFOL N/A 01/30/2013   Procedure: COLONOSCOPY WITH PROPOFOL;  Surgeon: Hilarie Fredrickson, MD;  Location: [facility];  Service: Endoscopy;  Laterality: N/A;   ERCP N/A 12/08/2016   Procedure: ENDOSCOPIC RETROGRADE CHOLANGIOPANCREATOGRAPHY (ERCP);  Surgeon: Meryl Dare, MD;  Location: [facility];  Service: Endoscopy;  Laterality: N/A;   ESOPHAGOGASTRODUODENOSCOPY (  EGD) WITH PROPOFOL N/A 01/30/2013   Procedure: ESOPHAGOGASTRODUODENOSCOPY (EGD) WITH PROPOFOL;  Surgeon: Hilarie Fredrickson, MD;  Location: [facility];  Service: Endoscopy;  Laterality: N/A;   LEFT HEART CATH AND CORONARY ANGIOGRAPHY N/A 10/18/2017   Procedure: LEFT HEART CATH AND CORONARY ANGIOGRAPHY;  Surgeon: Swazil[place], Peter M, MD;  Location: [facility] INVASIVE CV LAB;  Service: Cardiovascular;  Laterality: N/A;   LOOP RECORDER INSERTION N/A 03/18/2017   Procedure: Loop Recorder Insertion;  Surgeon: Regan Lemming, MD;  Location: [facility]  INVASIVE CV LAB;  Service: Cardiovascular;  Laterality: N/A;   PARTIAL HYSTERECTOMY     TEE WITHOUT CARDIOVERSION N/A 03/16/2017   Procedure: TRANSESOPHAGEAL ECHOCARDIOGRAM (TEE);  Surgeon: Elease Hashimoto Deloris Ping, MD;  Location: [facility] ENDOSCOPY;  Service: Cardiovascular;  Laterality: N/A;   TOTAL KNEE ARTHROPLASTY Right 04/20/2014   Procedure: RIGHT TOTAL KNEE ARTHROPLASTY CONVERTED TO RIGHT KNEE REIMPLANTATION;  Surgeon: Kathryne Hitch, MD;  Location: [facility];  Service: Orthopedics;  Laterality: Right;   TOTAL KNEE ARTHROPLASTY Left 08/23/2015   Procedure: LEFT TOTAL KNEE ARTHROPLASTY;  Surgeon: Kathryne Hitch, MD;  Location: [facility];  Service: Orthopedics;  Laterality: Left;   VENTRAL HERNIA REPAIR     x2   Social History:  reports that she has never smoked. She has never used smokeless tobacco. She reports that she does not currently use alcohol. She reports that she does not use drugs.  Allergies  Allergen Reactions   Other Other (See Comments)    Some BANDAIDS cause skin irritation    Penicillins Itching     Tolerated Zosyn couse 10/04/22    Family History  Problem Relation Age of Onset   Diabetes Mother    Hypertension Mother        entire family   Breast cancer Mother        diagnosed in her 35's   Stroke Father        CVA   Hyperlipidemia Father        entire family   Diabetes Father    Heart disease Father        No details.  Not at an early age.     Crohn's disease Son    Irritable bowel syndrome Son    Colon cancer Neg Hx    Colon polyps Neg Hx    Esophageal cancer Neg Hx    Stomach cancer Neg Hx    Rectal cancer Neg Hx     Prior to Admission medications   Medication Sig Start Date End Date Taking? Authorizing Provider  acetaminophen (TYLENOL) 500 MG tablet Take 1,000 mg by mouth every 6 (six) hours as needed for mild pain or moderate pain.   Yes [provider]  allopurinol (ZYLOPRIM) 300 MG tablet TAKE 1 TABLET BY MOUTH TWICE A DAY 08/27/22   Yes Boscia, Heather E, NP  apixaban (ELIQUIS) 5 MG TABS tablet Take 1 tablet (5 mg total) by mouth 2 (two) times daily. 01/26/23  Yes Drema Dallas, MD  CALCIUM PO Take 2 tablets by mouth daily.   Yes [provider]  Emollient (UDDERLY SMOOTH) CREA Apply 1 application topically See admin instructions. Apply topically to skin folds as needed for rash/irritation   Yes [provider]  famotidine (PEPCID) 10 MG tablet Take 10 mg by mouth daily as needed for heartburn or indigestion.   Yes [provider]  fenofibrate 54 MG tablet Take 1 tablet (54 mg total) by mouth daily. 01/26/23  Yes Drema Dallas, MD  ferrous sulfate 325 (65 FE) MG tablet TAKE 1 TABLET BY MOUTH TWICE A DAY Patient taking differently: Take 325 mg by mouth daily. 08/03/22  Yes Zehr, Princella Pellegrini, PA-C  gabapentin (NEURONTIN) 300 MG capsule Take 1 capsule (300 mg total) by mouth at bedtime. 12/21/22  Yes Kathryne Hitch, MD  sulfamethoxazole-trimethoprim (BACTRIM DS) 800-160 MG tablet Take 1 tablet by mouth every 12 (twelve) hours. 01/26/23  Yes Drema Dallas, MD  ACCU-CHEK AVIVA PLUS test strip USE AS INSTRUCTED ONCE A DAY. DX CODE E11.9 07/20/22   Mayer Masker, PA-C  Accu-Chek Softclix Lancets lancets Use as instructed once a day.  DX Code: E11.9 03/24/22   Mayer Masker, PA-C  dicyclomine (BENTYL) 20 MG tablet Take 1 tablet (20 mg total) by mouth every 12 (twelve) hours as needed (for abdominal pain/cramping). 01/18/23   Antony Madura, PA-C  isosorbide mononitrate (IMDUR) 30 MG 24 hr tablet Take 30 mg by mouth daily. 11/30/22   [provider]  olmesartan (BENICAR) 5 MG tablet Take 1 tablet (5 mg total) by mouth daily. 10/23/22   Carlean Jews, NP  ondansetron (ZOFRAN) 4 MG tablet Take 1 tablet (4 mg total) by mouth every 6 (six) hours as needed for nausea. 01/26/23   Drema Dallas, MD  pantoprazole (PROTONIX) 40 MG tablet Take 40 mg by mouth daily. 12/26/22   [provider]   rosuvastatin (CRESTOR) 40 MG tablet TAKE 1 TABLET BY MOUTH EVERY DAY 01/04/23   Rollene Rotunda, MD    Physical Exam: Vitals:   01/27/23 1752 01/27/23 1801 01/27/23 2257  BP: 113/71  (!) 145/79  Pulse: 82  60  Resp: 17  20  Temp: 99.2 F (37.3 C)  98.4 F (36.9 C)  TempSrc: Oral  Oral  SpO2: 99%  100%  Weight:  80.3 kg 83.1 kg  Height:   5\' 6"  (1.676 m)    Data Reviewed:  Results for orders placed or performed during the hospital encounter of 01/27/23 (from the past 24 hour(s))  CBC with Differential     Status: Abnormal   Collection Time: 01/27/23  6:09 PM  Result Value Ref Range   WBC 6.4 4.0 - 10.5 K/uL   RBC 3.85 (L) 3.87 - 5.11 MIL/uL   Hemoglobin 12.5 12.0 - 15.0 g/dL   HCT 16.1 09.6 - 04.5 %   MCV 98.2 80.0 - 100.0 fL   MCH 32.5 26.0 - 34.0 pg   MCHC 33.1 30.0 - 36.0 g/dL   RDW 40.9 81.1 - 91.4 %   Platelets 168 150 - 400 K/uL   nRBC 0.0 0.0 - 0.2 %   Neutrophils Relative % 61 %   Neutro Abs 3.9 1.7 - 7.7 K/uL   Lymphocytes Relative 33 %   Lymphs Abs 2.1 0.7 - 4.0 K/uL   Monocytes Relative 5 %   Monocytes Absolute 0.3 0.1 - 1.0 K/uL   Eosinophils Relative 0 %   Eosinophils Absolute 0.0 0.0 - 0.5 K/uL   Basophils Relative 1 %   Basophils Absolute 0.0 0.0 - 0.1 K/uL   Immature Granulocytes 0 %   Abs Immature Granulocytes 0.02 0.00 - 0.07 K/uL  Comprehensive metabolic panel     Status: Abnormal   Collection Time: 01/27/23  6:09 PM  Result Value Ref Range   Sodium 138 135 - 145 mmol/L   Potassium 4.3 3.5 - 5.1 mmol/L   Chloride 110 98 - 111 mmol/L   CO2 17 (L) 22 - 32 mmol/L  Glucose, Bld 91 70 - 99 mg/dL   BUN 18 8 - 23 mg/dL   Creatinine, Ser 8.29 (H) 0.44 - 1.00 mg/dL   Calcium 9.9 8.9 - 56.2 mg/dL   Total Protein 6.1 (L) 6.5 - 8.1 g/dL   Albumin 3.8 3.5 - 5.0 g/dL   AST 19 15 - 41 U/L   ALT 17 0 - 44 U/L   Alkaline Phosphatase 37 (L) 38 - 126 U/L   Total Bilirubin 0.5 0.3 - 1.2 mg/dL   GFR, Estimated 33 (L) >60 mL/min   Anion gap 11 5 - 15   Lipase, blood     Status: Abnormal   Collection Time: 01/27/23  6:09 PM  Result Value Ref Range   Lipase 52 (H) 11 - 51 U/L  CBC     Status: None   Collection Time: 01/27/23 10:58 PM  Result Value Ref Range   WBC 6.3 4.0 - 10.5 K/uL   RBC 3.94 3.87 - 5.11 MIL/uL   Hemoglobin 12.7 12.0 - 15.0 g/dL   HCT 13.0 86.5 - 78.4 %   MCV 99.5 80.0 - 100.0 fL   MCH 32.2 26.0 - 34.0 pg   MCHC 32.4 30.0 - 36.0 g/dL   RDW 69.6 29.5 - 28.4 %   Platelets 170 150 - 400 K/uL   nRBC 0.0 0.0 - 0.2 %  Creatinine, serum     Status: Abnormal   Collection Time: 01/27/23 10:58 PM  Result Value Ref Range   Creatinine, Ser 1.60 (H) 0.44 - 1.00 mg/dL   GFR, Estimated 34 (L) >60 mL/min    CT abdomen/pelvis no contrast IMPRESSION: 1. No acute localizing process in the abdomen or pelvis. 2. Small hiatal hernia. 3. Cholecystectomy with pneumobilia.  Assessment and Plan: Recurrent Abdominal pain presumed due to diverticulitis - Failed Bactrim Resume IV Zosyn. She had completed 4 days.  Patient presenting with recurrent lower abdominal pain and diarrhea  CT imaging without contrast wnl Placing patient on intravenous ceftriaxone and Flagyl Gentle intravenous hydration Correction of associated electrolyte abnormalities As needed opiate-based analgesics for associated pain Clear liquid diet We will advance diet as patient clinically improves. Consider Surgery consultation vs Dr Lam[facility] Spr[facility] group given the number of episodes she has had  2. AKI Patient exhibiting evidence of acute kidney injury presumed because of Bactrim + dehydration Creatinine is currently 1.6 an increase compared to baseline of .1.09 Hydrating patient with intravenous isotonic fluids. Strict input and output monitoring Monitoring renal function and electrolytes with serial chemistries Avoid nephrotoxic agents, including Bactrim if at all possible  3. Unintentional 70lb weight loss - GI follow up  4- H/o PE - Continue Eliquis     Advance Care Planning:   Code Status: Full Code the patient decided in the presence of her husband.  She names her husband as her surrogate Management consultant.  Consults: Consider surgery consult  Family Communication: Husband was present.  Severity of Illness: The appropriate patient status for this patient is INPATIENT. Inpatient status is judged to be reasonable and necessary in order to provide the required intensity of service to ensure the patient's safety. The patient's presenting symptoms, physical exam findings, and initial radiographic and laboratory data in the context of their chronic comorbidities is felt to place them at high risk for further clinical deterioration. Furthermore, it is not anticipated that the patient will be medically stable for discharge from the hospital within 2 midnights of admission.   * I certify that at the point  of admission it is my clinical judgment that the patient will require inpatient hospital care spanning beyond 2 midnights from the point of admission due to high intensity of service, high risk for further deterioration and high frequency of surveillance required.*  Author: Buena Irish, MD 01/28/2023 2:03 AM  For on call review [email or web address].

## 2023-01-28 NOTE — Plan of Care (Signed)

## 2023-01-28 NOTE — Progress Notes (Signed)
  Transition of Care [facility]) Screening Note   Patient Details  Name: PACHIA SHAKER Date of Birth: July 31, 1951   Transition of Care [facility]) CM/SW Contact:    Tom-Johnson, Hershal Coria, RN Phone Number: 01/28/2023, 3:21 PM  Transition of Care Department [facility]) has reviewed patient and no TOC needs or recommendations have been identified at this time. TOC will continue to monitor patient advancement through interdisciplinary progression rounds. If new patient transition needs arise, please place a TOC consult.

## 2023-01-28 NOTE — Plan of Care (Signed)
  Problem: Education: Goal: Knowledge of General Education information will improve Description: Including pain rating scale, medication(s)/side effects and non-pharmacologic comfort measures Outcome: Completed/Met   

## 2023-01-29 DIAGNOSIS — K5792 Diverticulitis of intestine, part unspecified, without perforation or abscess without bleeding: Secondary | ICD-10-CM | POA: Diagnosis not present

## 2023-01-29 LAB — CBC WITH DIFFERENTIAL/PLATELET
Abs Immature Granulocytes: 0.01 10*3/uL (ref 0.00–0.07)
Basophils Absolute: 0 10*3/uL (ref 0.0–0.1)
Basophils Relative: 1 %
Eosinophils Absolute: 0 10*3/uL (ref 0.0–0.5)
Eosinophils Relative: 1 %
HCT: 34 % — ABNORMAL LOW (ref 36.0–46.0)
Hemoglobin: 11.3 g/dL — ABNORMAL LOW (ref 12.0–15.0)
Immature Granulocytes: 0 %
Lymphocytes Relative: 37 %
Lymphs Abs: 1.7 10*3/uL (ref 0.7–4.0)
MCH: 32.3 pg (ref 26.0–34.0)
MCHC: 33.2 g/dL (ref 30.0–36.0)
MCV: 97.1 fL (ref 80.0–100.0)
Monocytes Absolute: 0.3 10*3/uL (ref 0.1–1.0)
Monocytes Relative: 6 %
Neutro Abs: 2.5 10*3/uL (ref 1.7–7.7)
Neutrophils Relative %: 55 %
Platelets: 124 10*3/uL — ABNORMAL LOW (ref 150–400)
RBC: 3.5 MIL/uL — ABNORMAL LOW (ref 3.87–5.11)
RDW: 12.8 % (ref 11.5–15.5)
WBC: 4.5 10*3/uL (ref 4.0–10.5)
nRBC: 0 % (ref 0.0–0.2)

## 2023-01-29 LAB — BASIC METABOLIC PANEL
Anion gap: 8 (ref 5–15)
BUN: 10 mg/dL (ref 8–23)
CO2: 22 mmol/L (ref 22–32)
Calcium: 9.6 mg/dL (ref 8.9–10.3)
Chloride: 109 mmol/L (ref 98–111)
Creatinine, Ser: 1.15 mg/dL — ABNORMAL HIGH (ref 0.44–1.00)
GFR, Estimated: 51 mL/min — ABNORMAL LOW (ref >60)
Glucose, Bld: 105 mg/dL — ABNORMAL HIGH (ref 70–99)
Potassium: 3.9 mmol/L (ref 3.5–5.1)
Sodium: 139 mmol/L (ref 135–145)

## 2023-01-29 MED ORDER — AMOXICILLIN-POT CLAVULANATE 875-125 MG PO TABS: 1.0000 | ORAL_TABLET | Freq: Two times a day (BID) | ORAL | 0 refills | Status: AC

## 2023-01-29 MED ORDER — AMOXICILLIN-POT CLAVULANATE 875-125 MG PO TABS
1.0000 | ORAL_TABLET | Freq: Two times a day (BID) | ORAL | Status: DC
  Administered 2023-01-29 – 2023-01-31 (×5): 1 via ORAL
  Filled 2023-01-29 (×5): qty 1

## 2023-01-29 MED ORDER — ONDANSETRON HCL 4 MG/2ML IJ SOLN
4.0000 mg | Freq: Four times a day (QID) | INTRAMUSCULAR | Status: DC | PRN
  Administered 2023-01-30: 4 mg via INTRAVENOUS
  Filled 2023-01-29: qty 2

## 2023-01-29 MED ORDER — ONDANSETRON HCL 4 MG/2ML IJ SOLN
INTRAMUSCULAR | Status: AC
  Administered 2023-01-29: 4 mg via INTRAVENOUS
  Filled 2023-01-29: qty 2

## 2023-01-29 MED ORDER — ONDANSETRON HCL 4 MG PO TABS: 4.0000 mg | ORAL_TABLET | Freq: Four times a day (QID) | ORAL | 0 refills | Status: AC | PRN

## 2023-01-29 NOTE — Progress Notes (Signed)
DC cancelled after pt c/o 7/10 abd pain and nausea, feeling weak.

## 2023-01-29 NOTE — Discharge Summary (Signed)
Physician Discharge Summary  Rachel Black ZOX:[id number] DOB: 1951/01/27 DOA: 01/27/2023  PCP: Melida Quitter, PA  Admit date: 01/27/2023 Discharge date: 01/29/2023  Admitted From: Home Discharge disposition: Home  Recommendations at discharge:  Complete the course of antibiotics with 10 more days of Augmentin along with probiotics Stay on soft diet for next 2 to 3 days and gradually advance as tolerated. Follow-up with general surgery as an outpatient   Brief narrative: Rachel Black is a 72 y.o. female with PMH significant for DM2, HTN, HLD, history of rheumatic fever as a child, CAD, stroke, GERD, gout, PFO, recent PE Patient has been hospitalized 3 times since jan 2024, last hospitalization 5/10 to 5/14 for acute sigmoid diverticulitis/sigmoid colitis as evidenced on CT abdomen that failed treatment as an outpatient.  Patient was initially started on IV Zosyn.  Patient reports her abdominal pain eased off while on IV antibiotics and she was able to tolerate oral intake and hence discharged on oral Bactrim. However within 48 hours of discharge, patient reported recurrence of pain and hence returned to the ED.  In the ED, patient was afebrile, heart rate 82, blood pressure 113/71, breathing on room air Initial labs with unremarkable CBC, creatinine elevated to 1.64, lipase 52 CT abdomen pelvis without any localizing acute finding. Started on IV Zosyn and admitted to [facility].  Subjective: Patient was seen and examined this morning.  Lying on bed.  Not in distress.  Able to tolerate soft diet.  Husband at bedside.  No fever. It seems she has not required any as needed pain medicines for abdominal pain since yesterday afternoon Feels a little nauseous today.  Assessment and plan: Recurrent Abdominal pain  Recently treated with acute diverticulitis with IV Zosyn and discharged on oral Bactrim.  Readmitted for worsening abdominal pain but no findings of recurrent colitis on CT  scan.   She was started on IV Zosyn and clear liquid diet.  Abdominal pain gradually improved. She has not required any as needed pain medicines for abdominal pain since yesterday afternoon Diet gradually advanced.  Able to tolerate soft diet.  On as needed Zofran for nausea. I discussed with general surgery informally.  No need of inpatient surgical consultation.  She will be called in by central Surgery for outpatient appointment. Last admission, she was discharged on Bactrim.  Unclear if the nausea she had was a side effect of Bactrim.  This time, will discharge on Augmentin for 10 more days. Stay on soft diet for next 2 to 3 days and gradually advance as tolerated.  AKI Baseline creatinine less than 1.2.  Presented with creatinine elevated 1.64.Marland Kitchen  With IV hydration, creatinine steadily improved. Recent Labs    01/15/23 0434 01/17/23 1924 01/21/23 1831 01/23/23 0347 01/24/23 0501 01/25/23 0439 01/26/23 0655 01/27/23 1809 01/27/23 2258 01/28/23 0125 01/29/23 1006  BUN 12 20 21 11 14 14 12 18   --  17 10  CREATININE 1.07* 1.10* 1.06* 1.00 1.15* 1.09* 1.05* 1.64* 1.60* 1.46* 1.15*    H/o PE  Diagnosed with PE 2 weeks ago.  Currently on Eliquis.  Continue same  Type 2 diabetes mellitus A1c 5.6 on 124 Not on meds.  Diet controlled  Hypertension Continue Imdur  CAD Stroke HLD Continue Eliquis, fenofibrate  GERD Continue Protonix  Gout Allopurinol  Goals of care   Code Status: Full Code   Wounds:  -    Discharge Exam:   Vitals:   01/28/23 1523 01/28/23 2053 01/29/23 0430 01/29/23 8119  BP: (!) 101/52 (!) 101/50 109/86 (!) 110/52  Pulse: (!) 46 (!) 45 (!) 44 65  Resp: 14 16 18 18   Temp: 99.1 F (37.3 C) 98.6 F (37 C) 97.7 F (36.5 C) 98 F (36.7 C)  TempSrc: Oral Oral Oral Oral  SpO2: (!) 81% 100% 98% 100%  Weight:      Height:        Body mass index is 29.57 kg/m.   General exam: Pleasant, elderly.  Not in distress  skin: No rashes, lesions or  ulcers. HEENT: Atraumatic, normocephalic, no obvious bleeding Lungs: Clear to auscultation bilaterally CVS: Regular rate and rhythm, no murmur GI/Abd soft, I could not elicit abdominal tenderness today, nondistended, bowel sound present CNS: Alert, awake, oriented x 3 Psychiatry: Sad affect Extremities: No pedal edema, no calf tenderness  Follow ups:    Follow-up Information     Karie Soda, MD Follow up on 02/22/2023.   Specialties: General Surgery, Colon and Rectal Surgery Why: 930am. Please arrive 30 minutes prior to your appointment for paperwork. bring a copy of your photo ID and insurance card. Contact information: [address] [place] [address] [phone]         Saralyn Pilar A, PA Follow up.   Specialty: Family Medicine Contact information: [address] [place] [address] [phone]                 Discharge Instructions:   Discharge Instructions     Call MD for:  difficulty breathing, headache or visual disturbances   Complete by: As directed    Call MD for:  extreme fatigue   Complete by: As directed    Call MD for:  hives   Complete by: As directed    Call MD for:  persistant dizziness or light-headedness   Complete by: As directed    Call MD for:  persistant nausea and vomiting   Complete by: As directed    Call MD for:  severe uncontrolled pain   Complete by: As directed    Call MD for:  temperature >100.4   Complete by: As directed    Diet general   Complete by: As directed    Stay on soft diet for next 2 to 3 days and gradually advance as tolerated.   Discharge instructions   Complete by: As directed    Recommendations at discharge:   Complete the course of antibiotics with 10 more days of Augmentin along with probiotics  Stay on soft diet for next 2 to 3 days and gradually advance as tolerated.  Follow-up with general surgery as an outpatient  General discharge instructions: Follow with  Primary MD Melida Quitter, PA in 7 days  Please request your PCP  to go over your hospital tests, procedures, radiology results at the follow up. Please get your medicines reviewed and adjusted.  Your PCP may decide to repeat certain labs or tests as needed. Do not drive, operate heavy machinery, perform activities at heights, swimming or participation in water activities or provide baby sitting services if your were admitted for syncope or siezures until you have seen by Primary MD or a Neurologist and advised to do so again. [place] Controlled Substance Reporting System database was reviewed. Do not drive, operate heavy machinery, perform activities at heights, swim, participate in water activities or provide baby-sitting services while on medications for pain, sleep and mood until your outpatient physician has reevaluated you and advised to  do so again.  You are strongly recommended to comply with the dose, frequency and duration of prescribed medications. Activity: As tolerated with Full fall precautions use walker/cane & assistance as needed Avoid using any recreational substances like cigarette, tobacco, alcohol, or non-prescribed drug. If you experience worsening of your admission symptoms, develop shortness of breath, life threatening emergency, suicidal or homicidal thoughts you must seek medical attention immediately by calling 911 or calling your MD immediately  if symptoms less severe. You must read complete instructions/literature along with all the possible adverse reactions/side effects for all the medicines you take and that have been prescribed to you. Take any new medicine only after you have completely understood and accepted all the possible adverse reactions/side effects.  Wear Seat belts while driving. You were cared for by a hospitalist during your hospital stay. If you have any questions about your discharge medications or the care you received while you were in the  hospital after you are discharged, you can call the unit and ask to speak with the hospitalist or the covering physician. Once you are discharged, your primary care physician will handle any further medical issues. Please note that NO REFILLS for any discharge medications will be authorized once you are discharged, as it is imperative that you return to your primary care physician (or establish a relationship with a primary care physician if you do not have one).   Increase activity slowly   Complete by: As directed        Discharge Medications:   Allergies as of 01/29/2023       Reactions   Other Other (See Comments)   Some BANDAIDS cause skin irritation    Penicillins Itching   Tolerated Zosyn couse 10/04/22        Medication List     STOP taking these medications    sulfamethoxazole-trimethoprim 800-160 MG tablet Commonly known as: BACTRIM DS       TAKE these medications    Accu-Chek Aviva Plus test strip Generic drug: glucose blood USE AS INSTRUCTED ONCE A DAY. DX CODE E11.9   Accu-Chek Softclix Lancets lancets Use as instructed once a day.  DX Code: E11.9   acetaminophen 500 MG tablet Commonly known as: TYLENOL Take 1,000 mg by mouth every 6 (six) hours as needed for mild pain or moderate pain.   allopurinol 300 MG tablet Commonly known as: ZYLOPRIM TAKE 1 TABLET BY MOUTH TWICE A DAY   amoxicillin-clavulanate 875-125 MG tablet Commonly known as: AUGMENTIN Take 1 tablet by mouth every 12 (twelve) hours for 10 days.   CALCIUM PO Take 2 tablets by mouth daily.   dicyclomine 20 MG tablet Commonly known as: BENTYL Take 1 tablet (20 mg total) by mouth every 12 (twelve) hours as needed (for abdominal pain/cramping).   Eliquis 5 MG Tabs tablet Generic drug: apixaban Take 1 tablet (5 mg total) by mouth 2 (two) times daily.   famotidine 10 MG tablet Commonly known as: PEPCID Take 10 mg by mouth daily as needed for heartburn or indigestion.   fenofibrate 54  MG tablet Take 1 tablet (54 mg total) by mouth daily.   ferrous sulfate 325 (65 FE) MG tablet TAKE 1 TABLET BY MOUTH TWICE A DAY What changed: when to take this   gabapentin 300 MG capsule Commonly known as: NEURONTIN Take 1 capsule (300 mg total) by mouth at bedtime.   isosorbide mononitrate 30 MG 24 hr tablet Commonly known as: IMDUR Take 30 mg by mouth daily.  olmesartan 5 MG tablet Commonly known as: BENICAR Take 1 tablet (5 mg total) by mouth daily.   ondansetron 4 MG tablet Commonly known as: ZOFRAN Take 1 tablet (4 mg total) by mouth every 6 (six) hours as needed for up to 5 days for nausea.   pantoprazole 40 MG tablet Commonly known as: PROTONIX Take 40 mg by mouth daily.   rosuvastatin 40 MG tablet Commonly known as: CRESTOR TAKE 1 TABLET BY MOUTH EVERY DAY   Udderly Smooth Crea Apply 1 application topically See admin instructions. Apply topically to skin folds as needed for rash/irritation         The results of significant diagnostics from this hospitalization (including imaging, microbiology, ancillary and laboratory) are listed below for reference.    Procedures and Diagnostic Studies:   CT ABDOMEN PELVIS WO CONTRAST  Result Date: 01/27/2023 CLINICAL DATA:  Acute abdominal pain EXAM: CT ABDOMEN AND PELVIS WITHOUT CONTRAST TECHNIQUE: Multidetector CT imaging of the abdomen and pelvis was performed following the standard protocol without IV contrast. RADIATION DOSE REDUCTION: This exam was performed according to the departmental dose-optimization program which includes automated exposure control, adjustment of the mA and/or kV according to patient size and/or use of iterative reconstruction technique. COMPARISON:  CT abdomen and pelvis 01/22/2023 FINDINGS: Lower chest: No acute abnormality. Hepatobiliary: Gallbladder is surgically absent. Pneumobilia again noted. No focal liver lesions are seen. Pancreas: Unremarkable. No pancreatic ductal dilatation or  surrounding inflammatory changes. Spleen: Normal in size without focal abnormality. Adrenals/Urinary Tract: Rounded hypodensity measuring 14 mm in the left kidney is unchanged. Otherwise, the kidneys, bladder and adrenal glands are within normal limits. Stomach/Bowel: There is a small hiatal hernia. Stomach is within normal limits. Appendix is not seen. No evidence of bowel wall thickening, distention, or inflammatory changes. Vascular/Lymphatic: No significant vascular findings are present. No enlarged abdominal or pelvic lymph nodes. Reproductive: Status post hysterectomy. No adnexal masses. Other: There is a tiny fat containing midline ventral hernia superior to the umbilicus, unchanged. Anterior abdominal wall mesh is present. There is no ascites. Musculoskeletal: Degenerative changes affect the spine. IMPRESSION: 1. No acute localizing process in the abdomen or pelvis. 2. Small hiatal hernia. 3. Cholecystectomy with pneumobilia. Electronically Signed   By: Darliss Cheney M.D.   On: 01/27/2023 21:51     Labs:   Basic Metabolic Panel: Recent Labs  Lab 01/23/23 0347 01/24/23 0501 01/25/23 0439 01/26/23 0655 01/27/23 1809 01/27/23 2258 01/28/23 0125 01/29/23 1006  NA 137 138 134* 138 138  --  138 139  K 3.4* 4.2 3.6 3.4* 4.3  --  4.2 3.9  CL 106 107 109 109 110  --  108 109  CO2 22 22 21* 21* 17*  --  20* 22  GLUCOSE 95 94 88 90 91  --  77 105*  BUN 11 14 14 12 18   --  17 10  CREATININE 1.00 1.15* 1.09* 1.05* 1.64* 1.60* 1.46* 1.15*  CALCIUM 9.1 9.4 9.1 9.2 9.9  --  9.3 9.6  MG 1.9 1.9 1.9 2.1  --   --  2.0  --   PHOS  --  3.5 3.6 3.2  --   --   --   --    GFR Estimated Creatinine Clearance: 49.4 mL/min (A) (by C-G formula based on SCr of 1.15 mg/dL (H)). Liver Function Tests: Recent Labs  Lab 01/24/23 0501 01/25/23 0439 01/26/23 0655 01/27/23 1809 01/28/23 0125  AST 18 18 20 19 17   ALT 13 16 16  17 14  ALKPHOS 30* 32* 31* 37* 32*  BILITOT 0.5 0.7 0.3 0.5 0.7  PROT 5.4* 5.5*  5.3* 6.1* 5.3*  ALBUMIN 3.2* 3.3* 3.2* 3.8 3.2*   Recent Labs  Lab 01/27/23 1809  LIPASE 52*   No results for input(s): "AMMONIA" in the last 168 hours. Coagulation profile No results for input(s): "INR", "PROTIME" in the last 168 hours.  CBC: Recent Labs  Lab 01/24/23 0501 01/25/23 0439 01/26/23 0655 01/27/23 1809 01/27/23 2258 01/28/23 0125 01/29/23 1006  WBC 5.5 5.9 5.8 6.4 6.3 5.8 4.5  NEUTROABS 2.7 2.9 3.2 3.9  --   --  2.5  HGB 11.5* 11.8* 11.6* 12.5 12.7 11.4* 11.3*  HCT 34.8* 36.2 35.4* 37.8 39.2 35.3* 34.0*  MCV 96.7 97.1 98.1 98.2 99.5 98.1 97.1  PLT 159 160 164 168 170 145* 124*   Cardiac Enzymes: No results for input(s): "CKTOTAL", "CKMB", "CKMBINDEX", "TROPONINI" in the last 168 hours. BNP: Invalid input(s): "POCBNP" CBG: No results for input(s): "GLUCAP" in the last 168 hours. D-Dimer No results for input(s): "DDIMER" in the last 72 hours. Hgb A1c No results for input(s): "HGBA1C" in the last 72 hours. Lipid Profile No results for input(s): "CHOL", "HDL", "LDLCALC", "TRIG", "CHOLHDL", "LDLDIRECT" in the last 72 hours. Thyroid function studies No results for input(s): "TSH", "T4TOTAL", "T3FREE", "THYROIDAB" in the last 72 hours.  Invalid input(s): "FREET3" Anemia work up No results for input(s): "VITAMINB12", "FOLATE", "FERRITIN", "TIBC", "IRON", "RETICCTPCT" in the last 72 hours. Microbiology Recent Results (from the past 240 hour(s))  C Difficile Quick Screen w PCR reflex     Status: None   Collection Time: 01/22/23  2:30 PM   Specimen: STOOL  Result Value Ref Range Status   C Diff antigen NEGATIVE NEGATIVE Final   C Diff toxin NEGATIVE NEGATIVE Final   C Diff interpretation No C. difficile detected.  Final    Comment: Performed at [facility], [address] [place], [place] [address]  Gastrointestinal Panel by PCR , Stool     Status: None   Collection Time: 01/22/23  2:30 PM   Specimen: Stool  Result Value Ref Range Status    Campylobacter species NOT DETECTED NOT DETECTED Final   Plesimonas shigelloides NOT DETECTED NOT DETECTED Final   Salmonella species NOT DETECTED NOT DETECTED Final   Yersinia enterocolitica NOT DETECTED NOT DETECTED Final   Vibrio species NOT DETECTED NOT DETECTED Final   Vibrio cholerae NOT DETECTED NOT DETECTED Final   Enteroaggregative E coli (EAEC) NOT DETECTED NOT DETECTED Final   Enteropathogenic E coli (EPEC) NOT DETECTED NOT DETECTED Final   Enterotoxigenic E coli (ETEC) NOT DETECTED NOT DETECTED Final   Shiga like toxin producing E coli (STEC) NOT DETECTED NOT DETECTED Final   Shigella/Enteroinvasive E coli (EIEC) NOT DETECTED NOT DETECTED Final   Cryptosporidium NOT DETECTED NOT DETECTED Final   Cyclospora cayetanensis NOT DETECTED NOT DETECTED Final   Entamoeba histolytica NOT DETECTED NOT DETECTED Final   Giardia lamblia NOT DETECTED NOT DETECTED Final   Adenovirus F40/41 NOT DETECTED NOT DETECTED Final   Astrovirus NOT DETECTED NOT DETECTED Final   Norovirus GI/GII NOT DETECTED NOT DETECTED Final   Rotavirus A NOT DETECTED NOT DETECTED Final   Sapovirus (I, II, IV, and V) NOT DETECTED NOT DETECTED Final    Comment: Performed at [facility], [address] [place], [place] [address]    Time coordinating discharge: 45 minutes  Signed: Melina Schools Rosland Riding  [facility] 01/29/2023, 11:37 AM

## 2023-01-29 NOTE — Plan of Care (Signed)

## 2023-01-30 DIAGNOSIS — K5792 Diverticulitis of intestine, part unspecified, without perforation or abscess without bleeding: Secondary | ICD-10-CM | POA: Diagnosis not present

## 2023-01-30 LAB — CBC WITH DIFFERENTIAL/PLATELET
Abs Immature Granulocytes: 0.01 10*3/uL (ref 0.00–0.07)
Basophils Absolute: 0 10*3/uL (ref 0.0–0.1)
Basophils Relative: 1 %
Eosinophils Absolute: 0 10*3/uL (ref 0.0–0.5)
Eosinophils Relative: 0 %
HCT: 31.5 % — ABNORMAL LOW (ref 36.0–46.0)
Hemoglobin: 10.6 g/dL — ABNORMAL LOW (ref 12.0–15.0)
Immature Granulocytes: 0 %
Lymphocytes Relative: 40 %
Lymphs Abs: 2.1 10*3/uL (ref 0.7–4.0)
MCH: 32.1 pg (ref 26.0–34.0)
MCHC: 33.7 g/dL (ref 30.0–36.0)
MCV: 95.5 fL (ref 80.0–100.0)
Monocytes Absolute: 0.3 10*3/uL (ref 0.1–1.0)
Monocytes Relative: 6 %
Neutro Abs: 2.8 10*3/uL (ref 1.7–7.7)
Neutrophils Relative %: 53 %
Platelets: 133 10*3/uL — ABNORMAL LOW (ref 150–400)
RBC: 3.3 MIL/uL — ABNORMAL LOW (ref 3.87–5.11)
RDW: 12.6 % (ref 11.5–15.5)
WBC: 5.2 10*3/uL (ref 4.0–10.5)
nRBC: 0 % (ref 0.0–0.2)

## 2023-01-30 LAB — BASIC METABOLIC PANEL
Anion gap: 8 (ref 5–15)
BUN: 10 mg/dL (ref 8–23)
CO2: 21 mmol/L — ABNORMAL LOW (ref 22–32)
Calcium: 9.1 mg/dL (ref 8.9–10.3)
Chloride: 108 mmol/L (ref 98–111)
Creatinine, Ser: 1 mg/dL (ref 0.44–1.00)
GFR, Estimated: >60 mL/min (ref >60)
Glucose, Bld: 99 mg/dL (ref 70–99)
Potassium: 3.8 mmol/L (ref 3.5–5.1)
Sodium: 137 mmol/L (ref 135–145)

## 2023-01-30 MED ORDER — DICYCLOMINE HCL 20 MG PO TABS: 20.0000 mg | ORAL_TABLET | Freq: Two times a day (BID) | ORAL | Status: DC | PRN

## 2023-01-30 MED ORDER — DICYCLOMINE HCL 20 MG PO TABS
20.0000 mg | ORAL_TABLET | Freq: Three times a day (TID) | ORAL | Status: DC
  Administered 2023-01-30 – 2023-01-31 (×5): 20 mg via ORAL
  Filled 2023-01-30 (×5): qty 1

## 2023-01-30 MED ORDER — ALPRAZOLAM 0.25 MG PO TABS: 0.2500 mg | ORAL_TABLET | Freq: Two times a day (BID) | ORAL | Status: DC | PRN

## 2023-01-30 MED ORDER — ALPRAZOLAM 0.25 MG PO TABS
0.2500 mg | ORAL_TABLET | Freq: Two times a day (BID) | ORAL | Status: DC
  Administered 2023-01-30 – 2023-01-31 (×3): 0.25 mg via ORAL
  Filled 2023-01-30 (×3): qty 1

## 2023-01-30 NOTE — Progress Notes (Signed)
PROGRESS NOTE  Rachel Black  DOB: [birth year]  PCP: Melida Quitter, [place] ZOX:[id number]  DOA: 01/27/2023  LOS: 3 days  Hospital Day: 4  Brief narrative: Rachel Black is a 72 y.o. female with PMH significant for DM2, HTN, HLD, history of rheumatic fever as a child, CAD, stroke, GERD, gout, PFO, recent PE, anxiety not on meds Patient has been hospitalized 3 times since jan 2024, last hospitalization 5/10 to 5/14 for acute sigmoid diverticulitis/sigmoid colitis as evidenced on CT abdomen that failed treatment as an outpatient.  Patient was initially started on IV Zosyn.  Patient reports her abdominal pain eased off while on IV antibiotics and she was able to tolerate oral intake and hence discharged on oral Bactrim. However within 48 hours of discharge, patient reported recurrence of pain and hence returned to the ED.  In the ED, patient was afebrile, heart rate 82, blood pressure 113/71, breathing on room air Initial labs with unremarkable CBC, creatinine elevated to 1.64, lipase 52 CT abdomen pelvis without any localizing acute finding. Started on IV Zosyn and admitted to [facility].  Subjective: Patient was seen and examined this morning. Lying on bed.  Continues to complain of nausea.  No vomiting.  Having regular bowel movement. Husband at bedside. Labs from this morning with normal creatinine.  Normal WBC count  I had prepared for discharge yesterday but patient felt nauseous and complained of 7/10 abdominal pain and hence could not be discharged.  Patient continues to complain similar symptoms.  She is however able to tolerate diet, has regular bowel movement and has not really had vomiting.  I had a long conversation with patient and her husband.  Husband states that she has been to the ED several times since 2023.   At home, she prefers to lay down in bed only and only ambulate to the bathroom.  She wants her husband at her bedside all the time.  Multiple times, she has had called  him anxiously back home while at work.  And every time, the husband would find her at home without any alarming symptoms.  In the past, she has also been seen by cardiology and had a loop recorder placed.  No arrhythmia identified despite the complaint of frequent palpitations. She has been seen by orthopedics, noted to have loss of muscle mass and recommended to have outpatient physical therapy.  But she has canceled these appointments citing abdominal symptoms. She denies any history of anxiety and does not think her symptoms are related to anxiety.   Assessment and plan: Recurrent Abdominal pain Suspect IBS and anxiety Multiple ER visits in the last 1 year for recurrent abdominal symptoms.   She had a colonoscopy with Dr. Marina Goodell in January 2023 showed left-sided diverticulosis.  She has not followed up since then. Recently treated with acute diverticulitis with IV Zosyn and discharged on oral Bactrim.  Readmitted for worsening abdominal pain but no findings of recurrent colitis on CT scan.   She was initially started on IV Zosyn and later switched to oral Augmentin. Able to tolerate soft diet. Still continues to complain of nausea and abdominal pain despite no other supportive labs and imaging evidence. I believe her symptoms are primarily due to IBS and anxiety which patient does not want to admit to. I will start on Bentyl 20 mg p.o. 3 times daily AC for next 3 days.  Anxiety/panic attack Husband states that she has been to the ED several times since 2023.   At home,  she prefers to lay down in bed only and only ambulate to the bathroom.  She wants her husband at her bedside all the time.  Multiple times, she has had called him anxiously back home while at work.  And every time, the husband would find her at home without any alarming symptoms.  In the past, she has also been seen by cardiology and had a loop recorder placed.  No arrhythmia identified despite the complaint of frequent  palpitations. She has been seen by orthopedics, noted to have loss of muscle mass and recommended to have outpatient physical therapy.  But she has canceled these appointments citing abdominal symptoms. She denies any history of anxiety and does not think her symptoms are related to anxiety.  Patient has agreed to try Xanax today.  I have started her on Xanax 0.25 mg twice daily.  AKI Baseline creatinine less than 1.2.  Presented with creatinine elevated 1.64..  Gradually improved with IV hydration.   Recent Labs    01/17/23 1924 01/21/23 1831 01/23/23 0347 01/24/23 0501 01/25/23 0439 01/26/23 0655 01/27/23 1809 01/27/23 2258 01/28/23 0125 01/29/23 1006 01/30/23 0154  BUN 20 21 11 14 14 12 18   --  17 10 10   CREATININE 1.10* 1.06* 1.00 1.15* 1.09* 1.05* 1.64* 1.60* 1.46* 1.15* 1.00     Recent PE  Diagnosed with PE 2 weeks ago.  Currently on Eliquis.  Continue same   Type 2 diabetes mellitus A1c 5.6 on 124 PTA on not on meds Currently not on meds  Hypertension Continue Imdur  CAD Stroke HLD Continue Eliquis, fenofibrate  GERD Continue Protonix  Gout Allopurinol   Mobility: Encourage ambulation  Goals of care   Code Status: Full Code     DVT prophylaxis:   apixaban (ELIQUIS) tablet 5 mg   Antimicrobials: Oral Augmentin Fluid: Can stop IV fluid Consultants: Curbside discussed with general surgery Family Communication: Husband at bedside  Status: Inpatient Level of care:  Med-Surg   Patient from: Home Anticipated d/c to: Home Needs to continue in-hospital care:  Unable to discharge because patient continues to complain of symptoms.  Trying new meds.   Diet:  Diet Order             DIET SOFT Room service appropriate? Yes; Fluid consistency: Thin  Diet effective now           Diet general                   Scheduled Meds:  allopurinol  300 mg Oral BID   amoxicillin-clavulanate  1 tablet Oral Q12H   apixaban  5 mg Oral BID    fenofibrate  54 mg Oral Daily   gabapentin  300 mg Oral QHS   isosorbide mononitrate  30 mg Oral Daily   pantoprazole  40 mg Oral Daily   rosuvastatin  40 mg Oral Daily    PRN meds: acetaminophen, ALPRAZolam, dicyclomine, HYDROmorphone (DILAUDID) injection, ondansetron (ZOFRAN) IV   Infusions:   lactated ringers 75 mL/hr at 01/29/23 2157    Antimicrobials: Anti-infectives (From admission, onward)    Start     Dose/Rate Route Frequency Ordered Stop   01/29/23 1145  amoxicillin-clavulanate (AUGMENTIN) 875-125 MG per tablet 1 tablet        1 tablet Oral Every 12 hours 01/29/23 1057     01/29/23 0000  amoxicillin-clavulanate (AUGMENTIN) 875-125 MG tablet        1 tablet Oral Every 12 hours 01/29/23 1137 02/08/23 2359  01/28/23 1000  piperacillin-tazobactam (ZOSYN) IVPB 2.25 g  Status:  Discontinued       Note to Pharmacy: Please dose   2.25 g 100 mL/hr over 30 Minutes Intravenous Every 12 hours 01/28/23 0350 01/28/23 0354   01/28/23 0445  piperacillin-tazobactam (ZOSYN) IVPB 3.375 g  Status:  Discontinued        3.375 g 12.5 mL/hr over 240 Minutes Intravenous Every 8 hours 01/28/23 0354 01/29/23 1057       Nutritional status:  Body mass index is 29.57 kg/m.          Objective: Vitals:   01/30/23 0550 01/30/23 0733  BP: 105/62 (!) 142/101  Pulse: (!) 54   Resp: 18 17  Temp: 97.8 F (36.6 C) 98.1 F (36.7 C)  SpO2: 99% 97%    Intake/Output Summary (Last 24 hours) at 01/30/2023 1037 Last data filed at 01/30/2023 0900 Gross per 24 hour  Intake 1889.95 ml  Output 400 ml  Net 1489.95 ml    Filed Weights   01/27/23 1801 01/27/23 2257  Weight: 80.3 kg 83.1 kg   Weight change:  Body mass index is 29.57 kg/m.   Physical Exam: General exam: Pleasant, elderly.  Continues to have abdominal pain Skin: No rashes, lesions or ulcers. HEENT: Atraumatic, normocephalic, no obvious bleeding Lungs: Clear to auscultation bilaterally CVS: Regular rate and rhythm, no  murmur GI/Abd soft, mild lower abdominal and left lower quadrant tenderness, nondistended, bowel sound present CNS: Alert, awake, oriented x 3 Psychiatry: Sad affect Extremities: No pedal edema, no calf tenderness  Data Review: I have personally reviewed the laboratory data and studies available.  F/u labs ordered Unresulted Labs (From admission, onward)    None       Total time spent in review of labs and imaging, patient evaluation, formulation of plan, documentation and communication with family: 45 minutes  Signed, Lorin Glass, MD [facility] 01/30/2023

## 2023-01-30 NOTE — Plan of Care (Signed)

## 2023-01-31 DIAGNOSIS — K5792 Diverticulitis of intestine, part unspecified, without perforation or abscess without bleeding: Secondary | ICD-10-CM | POA: Diagnosis not present

## 2023-01-31 DIAGNOSIS — F419 Anxiety disorder, unspecified: Secondary | ICD-10-CM | POA: Insufficient documentation

## 2023-01-31 MED ORDER — OXYCODONE HCL 5 MG PO TABS: 5.0000 mg | ORAL_TABLET | Freq: Three times a day (TID) | ORAL | 0 refills | Status: AC | PRN

## 2023-01-31 MED ORDER — ALPRAZOLAM 0.25 MG PO TABS: 0.2500 mg | ORAL_TABLET | Freq: Two times a day (BID) | ORAL | 0 refills | Status: AC

## 2023-01-31 MED ORDER — DICYCLOMINE HCL 20 MG PO TABS: 20.0000 mg | ORAL_TABLET | Freq: Three times a day (TID) | ORAL | 0 refills | Status: DC | PRN

## 2023-01-31 MED ORDER — OXYCODONE-ACETAMINOPHEN 5-325 MG PO TABS: 1.0000 | ORAL_TABLET | Freq: Four times a day (QID) | ORAL | Status: DC | PRN

## 2023-01-31 NOTE — Plan of Care (Signed)

## 2023-01-31 NOTE — Evaluation (Signed)
Physical Therapy Evaluation and Discharge Patient Details Name: Rachel Black MRN: [id number] DOB: 1950/12/15 Today's Date: 01/31/2023  History of Present Illness  Rachel Black is a 72 y.o. female who has been hospitalized 3 times since jan 2024, last hospitalization 5/10 to 5/14 for acute sigmoid diverticulitis/sigmoid colitis as evidenced on CT abdomen that failed treatment as an outpatient. Patient was initially started on IV Zosyn. Patient reports her abdominal pain eased off while on IV antibiotics and she was able to tolerate oral intake and hence discharged on oral Bactrim. However within 48 hours of discharge, patient reported recurrence of pain and hence returned to the ED on 5/18; with PMH significant for DM2, HTN, HLD, history of rheumatic fever as a child, CAD, stroke, GERD, gout, PFO, recent PE, anxiety not on meds  Clinical Impression   Patient evaluated by Physical Therapy with no further acute PT needs identified. All education has been completed and the patient has no further questions. Recommend Outpt PT follow up, and we discussed strategies for managing bathroom needs;  See below for any follow-up Physical Therapy or equipment needs. PT is signing off. Thank you for this referral.        Recommendations for follow up therapy are one component of a multi-disciplinary discharge planning process, led by the attending physician.  Recommendations may be updated based on patient status, additional functional criteria and insurance authorization.  Follow Up Recommendations       Assistance Recommended at Discharge PRN  Patient can return home with the following  Help with stairs or ramp for entrance;Assistance with cooking/housework    Equipment Recommendations None recommended by PT  Recommendations for Other Services       Functional Status Assessment Patient has had a recent decline in their functional status and demonstrates the ability to make significant  improvements in function in a reasonable and predictable amount of time.     Precautions / Restrictions Precautions Precautions: Fall Restrictions Weight Bearing Restrictions: No      Mobility  Bed Mobility Overal bed mobility: Independent                  Transfers Overall transfer level: Modified independent Equipment used: Rolling walker (2 wheels)                    Ambulation/Gait Ambulation/Gait assistance: Modified independent (Device/Increase time) Gait Distance (Feet): 140 Feet Assistive device: Rolling walker (2 wheels)            Stairs Stairs: Yes Stairs assistance: Min assist Stair Management: One rail Right, Forwards, Step to pattern Number of Stairs: 2 General stair comments: Noting more difficulty rising up the step when leading with L LE  Wheelchair Mobility    Modified Rankin (Stroke Patients Only)       Balance Overall balance assessment: Modified Independent                                           Pertinent Vitals/Pain Pain Assessment Pain Assessment: Faces Faces Pain Scale: Hurts a little bit Pain Location: stomach/GI discomfort Pain Descriptors / Indicators: Discomfort Pain Intervention(s): Monitored during session    Home Living Family/patient expects to be discharged to:: Private residence Living Arrangements: Spouse/significant other Available Help at Discharge: Family;Available PRN/intermittently Type of Home: House Home Access: Stairs to enter Entrance Stairs-Rails: None Entrance Stairs-Number of Steps: 2 Alternate  Level Stairs-Number of Steps: 2 (down into living room--pt does not have to access) Home Layout: Two level Home Equipment: Agricultural consultant (2 wheels);Shower seat;Grab bars - tub/shower;Grab bars - toilet;Rollator (4 wheels) Additional Comments: spouse works 5 am-3:30    Prior Function Prior Level of Function : Independent/Modified Independent             Mobility  Comments: using rolling walker at home; assist/hand on the steps ADLs Comments: independent with basic ADLs; husband does IADLs     Hand Dominance   Dominant Hand: Right    Extremity/Trunk Assessment   Upper Extremity Assessment Upper Extremity Assessment: Overall WFL for tasks assessed    Lower Extremity Assessment Lower Extremity Assessment: Generalized weakness (L knee weaker and gives pt "more problems" than R knee)    Cervical / Trunk Assessment Cervical / Trunk Assessment: Normal  Communication   Communication: No difficulties  Cognition Arousal/Alertness: Awake/alert Behavior During Therapy: WFL for tasks assessed/performed Overall Cognitive Status: Within Functional Limits for tasks assessed                                          General Comments General comments (skin integrity, edema, etc.): Husband present    Exercises     Assessment/Plan    PT Assessment All further PT needs can be met in the next venue of care  PT Problem List Decreased strength;Decreased activity tolerance       PT Treatment Interventions      PT Goals (Current goals can be found in the Care Plan section)  Acute Rehab PT Goals Patient Stated Goal: Would like to be able to get out of teh house without worrying about possibility of a bowel accident PT Goal Formulation: All assessment and education complete, DC therapy    Frequency       Co-evaluation               AM-PAC PT "6 Clicks" Mobility  Outcome Measure Help needed turning from your back to your side while in a flat bed without using bedrails?: None Help needed moving from lying on your back to sitting on the side of a flat bed without using bedrails?: None Help needed moving to and from a bed to a chair (including a wheelchair)?: None Help needed standing up from a chair using your arms (e.g., wheelchair or bedside chair)?: None Help needed to walk in hospital room?: None Help needed climbing  3-5 steps with a railing? : A Little 6 Click Score: 23    End of Session   Activity Tolerance: Patient tolerated treatment well Patient left: in bed;with call bell/phone within reach;with family/visitor present Nurse Communication: Mobility status PT Visit Diagnosis: Difficulty in walking, not elsewhere classified (R26.2);Muscle weakness (generalized) (M62.81)    Time: 0102-7253 PT Time Calculation (min) (ACUTE ONLY): 23 min   Charges:   PT Evaluation $PT Eval Low Complexity: 1 Low PT Treatments $Gait Training: 8-22 mins        Van Clines, PT  Acute Rehabilitation Services Office [phone] Secure Chat welcomed   Levi Aland 01/31/2023, 2:29 PM

## 2023-01-31 NOTE — Progress Notes (Signed)
DISCHARGE NOTE HOME Rachel Black to be discharged Home per MD order. Discussed prescriptions and follow up appointments with the patient. Prescriptions given to patient; medication list explained in detail. Patient verbalized understanding.  Skin clean, dry and intact without evidence of skin break down, no evidence of skin tears noted. IV catheter discontinued intact. Site without signs and symptoms of complications. Dressing and pressure applied. Pt denies pain at the site currently. No complaints noted.  Patient free of lines, drains, and wounds.   An After Visit Summary (AVS) was printed and given to the patient. Patient escorted via wheelchair, and discharged home via private auto. Picked up by Randa Ngo, Husband.  Margarita Grizzle, RN

## 2023-01-31 NOTE — Discharge Summary (Signed)
Physician Discharge Summary  Rachel Black WUJ:[id number] DOB: 12/14/1950 DOA: 01/27/2023  PCP: Melida Quitter, PA  Admit date: 01/27/2023 Discharge date: 01/31/2023  Admitted From: Home Discharge disposition: Home with [facility] PT  Recommendations at discharge:  Complete the course of antibiotics with 10 more days of Augmentin along with probiotics Stay on soft diet for next 2 to 3 days and gradually advance as tolerated. Follow-up with general surgery as an outpatient   Brief narrative: Rachel Black is a 72 y.o. female with PMH significant for DM2, HTN, HLD, history of rheumatic fever as a child, CAD, stroke, GERD, gout, PFO, recent PE Patient has been hospitalized 3 times since jan 2024, last hospitalization 5/10 to 5/14 for acute sigmoid diverticulitis/sigmoid colitis as evidenced on CT abdomen that failed treatment as an outpatient.  Patient was initially started on IV Zosyn.  Patient reports her abdominal pain eased off while on IV antibiotics and she was able to tolerate oral intake and hence discharged on oral Bactrim. However within 48 hours of discharge, patient reported recurrence of pain and hence returned to the ED.  In the ED, patient was afebrile, heart rate 82, blood pressure 113/71, breathing on room air Initial labs with unremarkable CBC, creatinine elevated to 1.64, lipase 52 CT abdomen pelvis without any localizing acute finding. Started on IV Zosyn and admitted to [facility].  Subjective: Patient was seen and examined this morning.  Lying on bed.  Not in distress.   Continues to complain of abdominal symptoms although no tenderness present on exam when distracted.   For the last 48 hours, she has been able to tolerate soft diet.  No fever.  Blood work unremarkable.     Assessment and plan: Recurrent Abdominal pain Suspect IBS and anxiety Multiple ER visits in the last 1 year for recurrent abdominal symptoms.   She had a colonoscopy with Dr. Marina Goodell in January 2023  showed left-sided diverticulosis.  She has not followed up since then. Recently treated with acute diverticulitis with IV Zosyn and discharged on oral Bactrim.  Readmitted for worsening abdominal pain but no findings of recurrent colitis on CT scan.   She was initially started on IV Zosyn and later switched to oral Augmentin. Able to tolerate soft diet. Still continues to complain of nausea and abdominal pain despite no other supportive labs and imaging evidence. I believe her symptoms are primarily due to IBS and anxiety which patient does not want to admit to. I have started her on Bentyl 20 mg p.o. 3 times daily AC PRN.  Anxiety/Depression Patient seems to have underlying uncontrolled anxiety/depression.  Husband states that she has been to the ED several times since 2023.   At home, she prefers to lay down in bed only and only ambulate to the bathroom.  She wants her husband at her bedside all the time.  Multiple times, she has had called him anxiously back home while at work.  And every time, the husband would find her at home without any alarming symptoms.  In the past, she has also been seen by cardiology and had a loop recorder placed.  No arrhythmia identified despite the complaint of frequent palpitations. She has been seen by orthopedics, noted to have loss of muscle mass and recommended to have outpatient physical therapy.  But she has canceled these appointments citing abdominal symptoms. She denies any history of anxiety and does not think her symptoms are related to anxiety/depression Patient has agreed to try Xanax today.  I have started her on Xanax 0.25 mg twice daily. Prescription given for 7 days.  To follow-up with PCP as an outpatient.  AKI Baseline creatinine less than 1.2.  Presented with creatinine elevated 1.64.  Gradually improved with IV hydration. Recent Labs    01/17/23 1924 01/21/23 1831 01/23/23 0347 01/24/23 0501 01/25/23 0439 01/26/23 0655 01/27/23 1809  01/27/23 2258 01/28/23 0125 01/29/23 1006 01/30/23 0154  BUN 20 21 11 14 14 12 18   --  17 10 10   CREATININE 1.10* 1.06* 1.00 1.15* 1.09* 1.05* 1.64* 1.60* 1.46* 1.15* 1.00    Recent PE  Diagnosed with PE 2 weeks ago.  Currently on Eliquis.  Continue same  Type 2 diabetes mellitus A1c 5.6 on 124 Not on meds.  Diet controlled  Hypertension Continue Imdur  CAD Stroke HLD Continue Eliquis, fenofibrate  GERD Continue Protonix  Gout Allopurinol  Goals of care   Code Status: Full Code   Wounds:  -    Discharge Exam:   Vitals:   01/30/23 1924 01/31/23 0450 01/31/23 0614 01/31/23 0708  BP: (!) 116/58 (!) 105/44 114/62 116/60  Pulse: 60 (!) 38 69 (!) 54  Resp: 17 16 20 18   Temp: 98.5 F (36.9 C) 98.6 F (37 C) 98.6 F (37 C) 98.1 F (36.7 C)  TempSrc: Oral   Oral  SpO2: 99% 95% 97% 98%  Weight:      Height:        Body mass index is 29.57 kg/m.   General exam: Pleasant, elderly.  Not in distress  skin: No rashes, lesions or ulcers. HEENT: Atraumatic, normocephalic, no obvious bleeding Lungs: Clear to auscultation bilaterally CVS: Regular rate and rhythm, no murmur GI/Abd soft, I could not elicit abdominal tenderness today, nondistended, bowel sound present CNS: Alert, awake, oriented x 3 Psychiatry: Looks depressed Extremities: No pedal edema, no calf tenderness  Follow ups:    Follow-up Information     Karie Soda, MD Follow up on 02/22/2023.   Specialties: General Surgery, Colon and Rectal Surgery Why: 930am. Please arrive 30 minutes prior to your appointment for paperwork. bring a copy of your photo ID and insurance card. Contact information: [address] [place] [address] [phone]         Saralyn Pilar A, PA Follow up.   Specialty: Family Medicine Contact information: [address] [place] [address] [phone]                 Discharge Instructions:   Discharge Instructions      Ambulatory referral to Psychiatry   Complete by: As directed    Call MD for:  difficulty breathing, headache or visual disturbances   Complete by: As directed    Call MD for:  extreme fatigue   Complete by: As directed    Call MD for:  hives   Complete by: As directed    Call MD for:  persistant dizziness or light-headedness   Complete by: As directed    Call MD for:  persistant nausea and vomiting   Complete by: As directed    Call MD for:  severe uncontrolled pain   Complete by: As directed    Call MD for:  temperature >100.4   Complete by: As directed    Diet general   Complete by: As directed    Stay on soft diet for next 2 to 3 days and gradually advance as tolerated.   Discharge instructions   Complete by:  As directed    Recommendations at discharge:   Complete the course of antibiotics with 10 more days of Augmentin along with probiotics  Stay on soft diet for next 2 to 3 days and gradually advance as tolerated.  Follow-up with general surgery as an outpatient  General discharge instructions: Follow with Primary MD Melida Quitter, PA in 7 days  Please request your PCP  to go over your hospital tests, procedures, radiology results at the follow up. Please get your medicines reviewed and adjusted.  Your PCP may decide to repeat certain labs or tests as needed. Do not drive, operate heavy machinery, perform activities at heights, swimming or participation in water activities or provide baby sitting services if your were admitted for syncope or siezures until you have seen by Primary MD or a Neurologist and advised to do so again. [place] Controlled Substance Reporting System database was reviewed. Do not drive, operate heavy machinery, perform activities at heights, swim, participate in water activities or provide baby-sitting services while on medications for pain, sleep and mood until your outpatient physician has reevaluated you and advised to do so again.  You are  strongly recommended to comply with the dose, frequency and duration of prescribed medications. Activity: As tolerated with Full fall precautions use walker/cane & assistance as needed Avoid using any recreational substances like cigarette, tobacco, alcohol, or non-prescribed drug. If you experience worsening of your admission symptoms, develop shortness of breath, life threatening emergency, suicidal or homicidal thoughts you must seek medical attention immediately by calling 911 or calling your MD immediately  if symptoms less severe. You must read complete instructions/literature along with all the possible adverse reactions/side effects for all the medicines you take and that have been prescribed to you. Take any new medicine only after you have completely understood and accepted all the possible adverse reactions/side effects.  Wear Seat belts while driving. You were cared for by a hospitalist during your hospital stay. If you have any questions about your discharge medications or the care you received while you were in the hospital after you are discharged, you can call the unit and ask to speak with the hospitalist or the covering physician. Once you are discharged, your primary care physician will handle any further medical issues. Please note that NO REFILLS for any discharge medications will be authorized once you are discharged, as it is imperative that you return to your primary care physician (or establish a relationship with a primary care physician if you do not have one).   Increase activity slowly   Complete by: As directed        Discharge Medications:   Allergies as of 01/31/2023       Reactions   Other Other (See Comments)   Some BANDAIDS cause skin irritation    Penicillins Itching   Tolerated Zosyn couse 10/04/22        Medication List     STOP taking these medications    sulfamethoxazole-trimethoprim 800-160 MG tablet Commonly known as: BACTRIM DS       TAKE  these medications    Accu-Chek Aviva Plus test strip Generic drug: glucose blood USE AS INSTRUCTED ONCE A DAY. DX CODE E11.9   Accu-Chek Softclix Lancets lancets Use as instructed once a day.  DX Code: E11.9   acetaminophen 500 MG tablet Commonly known as: TYLENOL Take 1,000 mg by mouth every 6 (six) hours as needed for mild pain or moderate pain.   allopurinol 300 MG tablet  Commonly known as: ZYLOPRIM TAKE 1 TABLET BY MOUTH TWICE A DAY   ALPRAZolam 0.25 MG tablet Commonly known as: XANAX Take 1 tablet (0.25 mg total) by mouth 2 (two) times daily for 7 days.   amoxicillin-clavulanate 875-125 MG tablet Commonly known as: AUGMENTIN Take 1 tablet by mouth every 12 (twelve) hours for 10 days.   CALCIUM PO Take 2 tablets by mouth daily.   dicyclomine 20 MG tablet Commonly known as: BENTYL Take 1 tablet (20 mg total) by mouth 3 (three) times daily with meals as needed for up to 7 days for spasms. What changed:  when to take this reasons to take this   Eliquis 5 MG Tabs tablet Generic drug: apixaban Take 1 tablet (5 mg total) by mouth 2 (two) times daily.   famotidine 10 MG tablet Commonly known as: PEPCID Take 10 mg by mouth daily as needed for heartburn or indigestion.   fenofibrate 54 MG tablet Take 1 tablet (54 mg total) by mouth daily.   ferrous sulfate 325 (65 FE) MG tablet TAKE 1 TABLET BY MOUTH TWICE A DAY What changed: when to take this   gabapentin 300 MG capsule Commonly known as: NEURONTIN Take 1 capsule (300 mg total) by mouth at bedtime.   isosorbide mononitrate 30 MG 24 hr tablet Commonly known as: IMDUR Take 30 mg by mouth daily.   olmesartan 5 MG tablet Commonly known as: BENICAR Take 1 tablet (5 mg total) by mouth daily.   ondansetron 4 MG tablet Commonly known as: ZOFRAN Take 1 tablet (4 mg total) by mouth every 6 (six) hours as needed for up to 5 days for nausea.   oxyCODONE 5 MG immediate release tablet Commonly known as:  Roxicodone Take 1 tablet (5 mg total) by mouth every 8 (eight) hours as needed for up to 5 days.   pantoprazole 40 MG tablet Commonly known as: PROTONIX Take 40 mg by mouth daily.   rosuvastatin 40 MG tablet Commonly known as: CRESTOR TAKE 1 TABLET BY MOUTH EVERY DAY   Udderly Smooth Crea Apply 1 application topically See admin instructions. Apply topically to skin folds as needed for rash/irritation        The results of significant diagnostics from this hospitalization (including imaging, microbiology, ancillary and laboratory) are listed below for reference.    Procedures and Diagnostic Studies:   CT ABDOMEN PELVIS WO CONTRAST  Result Date: 01/27/2023 CLINICAL DATA:  Acute abdominal pain EXAM: CT ABDOMEN AND PELVIS WITHOUT CONTRAST TECHNIQUE: Multidetector CT imaging of the abdomen and pelvis was performed following the standard protocol without IV contrast. RADIATION DOSE REDUCTION: This exam was performed according to the departmental dose-optimization program which includes automated exposure control, adjustment of the mA and/or kV according to patient size and/or use of iterative reconstruction technique. COMPARISON:  CT abdomen and pelvis 01/22/2023 FINDINGS: Lower chest: No acute abnormality. Hepatobiliary: Gallbladder is surgically absent. Pneumobilia again noted. No focal liver lesions are seen. Pancreas: Unremarkable. No pancreatic ductal dilatation or surrounding inflammatory changes. Spleen: Normal in size without focal abnormality. Adrenals/Urinary Tract: Rounded hypodensity measuring 14 mm in the left kidney is unchanged. Otherwise, the kidneys, bladder and adrenal glands are within normal limits. Stomach/Bowel: There is a small hiatal hernia. Stomach is within normal limits. Appendix is not seen. No evidence of bowel wall thickening, distention, or inflammatory changes. Vascular/Lymphatic: No significant vascular findings are present. No enlarged abdominal or pelvic lymph  nodes. Reproductive: Status post hysterectomy. No adnexal masses. Other: There is a tiny  fat containing midline ventral hernia superior to the umbilicus, unchanged. Anterior abdominal wall mesh is present. There is no ascites. Musculoskeletal: Degenerative changes affect the spine. IMPRESSION: 1. No acute localizing process in the abdomen or pelvis. 2. Small hiatal hernia. 3. Cholecystectomy with pneumobilia. Electronically Signed   By: Darliss Cheney M.D.   On: 01/27/2023 21:51     Labs:   Basic Metabolic Panel: Recent Labs  Lab 01/25/23 0439 01/26/23 0655 01/27/23 1809 01/27/23 2258 01/28/23 0125 01/29/23 1006 01/30/23 0154  NA 134* 138 138  --  138 139 137  K 3.6 3.4* 4.3  --  4.2 3.9 3.8  CL 109 109 110  --  108 109 108  CO2 21* 21* 17*  --  20* 22 21*  GLUCOSE 88 90 91  --  77 105* 99  BUN 14 12 18   --  17 10 10   CREATININE 1.09* 1.05* 1.64* 1.60* 1.46* 1.15* 1.00  CALCIUM 9.1 9.2 9.9  --  9.3 9.6 9.1  MG 1.9 2.1  --   --  2.0  --   --   PHOS 3.6 3.2  --   --   --   --   --    GFR Estimated Creatinine Clearance: 56.9 mL/min (by C-G formula based on SCr of 1 mg/dL). Liver Function Tests: Recent Labs  Lab 01/25/23 0439 01/26/23 0655 01/27/23 1809 01/28/23 0125  AST 18 20 19 17   ALT 16 16 17 14   ALKPHOS 32* 31* 37* 32*  BILITOT 0.7 0.3 0.5 0.7  PROT 5.5* 5.3* 6.1* 5.3*  ALBUMIN 3.3* 3.2* 3.8 3.2*   Recent Labs  Lab 01/27/23 1809  LIPASE 52*   No results for input(s): "AMMONIA" in the last 168 hours. Coagulation profile No results for input(s): "INR", "PROTIME" in the last 168 hours.  CBC: Recent Labs  Lab 01/25/23 0439 01/26/23 0655 01/27/23 1809 01/27/23 2258 01/28/23 0125 01/29/23 1006 01/30/23 0154  WBC 5.9 5.8 6.4 6.3 5.8 4.5 5.2  NEUTROABS 2.9 3.2 3.9  --   --  2.5 2.8  HGB 11.8* 11.6* 12.5 12.7 11.4* 11.3* 10.6*  HCT 36.2 35.4* 37.8 39.2 35.3* 34.0* 31.5*  MCV 97.1 98.1 98.2 99.5 98.1 97.1 95.5  PLT 160 164 168 170 145* 124* 133*   Cardiac  Enzymes: No results for input(s): "CKTOTAL", "CKMB", "CKMBINDEX", "TROPONINI" in the last 168 hours. BNP: Invalid input(s): "POCBNP" CBG: No results for input(s): "GLUCAP" in the last 168 hours. D-Dimer No results for input(s): "DDIMER" in the last 72 hours. Hgb A1c No results for input(s): "HGBA1C" in the last 72 hours. Lipid Profile No results for input(s): "CHOL", "HDL", "LDLCALC", "TRIG", "CHOLHDL", "LDLDIRECT" in the last 72 hours. Thyroid function studies No results for input(s): "TSH", "T4TOTAL", "T3FREE", "THYROIDAB" in the last 72 hours.  Invalid input(s): "FREET3" Anemia work up No results for input(s): "VITAMINB12", "FOLATE", "FERRITIN", "TIBC", "IRON", "RETICCTPCT" in the last 72 hours. Microbiology Recent Results (from the past 240 hour(s))  C Difficile Quick Screen w PCR reflex     Status: None   Collection Time: 01/22/23  2:30 PM   Specimen: STOOL  Result Value Ref Range Status   C Diff antigen NEGATIVE NEGATIVE Final   C Diff toxin NEGATIVE NEGATIVE Final   C Diff interpretation No C. difficile detected.  Final    Comment: Performed at [facility], [address], [place], [place] [address]  Gastrointestinal Panel by PCR , Stool     Status: None  Collection Time: 01/22/23  2:30 PM   Specimen: Stool  Result Value Ref Range Status   Campylobacter species NOT DETECTED NOT DETECTED Final   Plesimonas shigelloides NOT DETECTED NOT DETECTED Final   Salmonella species NOT DETECTED NOT DETECTED Final   Yersinia enterocolitica NOT DETECTED NOT DETECTED Final   Vibrio species NOT DETECTED NOT DETECTED Final   Vibrio cholerae NOT DETECTED NOT DETECTED Final   Enteroaggregative E coli (EAEC) NOT DETECTED NOT DETECTED Final   Enteropathogenic E coli (EPEC) NOT DETECTED NOT DETECTED Final   Enterotoxigenic E coli (ETEC) NOT DETECTED NOT DETECTED Final   Shiga like toxin producing E coli (STEC) NOT DETECTED NOT DETECTED Final   Shigella/Enteroinvasive E coli (EIEC)  NOT DETECTED NOT DETECTED Final   Cryptosporidium NOT DETECTED NOT DETECTED Final   Cyclospora cayetanensis NOT DETECTED NOT DETECTED Final   Entamoeba histolytica NOT DETECTED NOT DETECTED Final   Giardia lamblia NOT DETECTED NOT DETECTED Final   Adenovirus F40/41 NOT DETECTED NOT DETECTED Final   Astrovirus NOT DETECTED NOT DETECTED Final   Norovirus GI/GII NOT DETECTED NOT DETECTED Final   Rotavirus A NOT DETECTED NOT DETECTED Final   Sapovirus (I, II, IV, and V) NOT DETECTED NOT DETECTED Final    Comment: Performed at [facility], [address], [place], [place] [address]    Time coordinating discharge: 45 minutes  Signed: Melina Schools [name]  [facility] 01/31/2023, 1:37 PM

## 2023-02-01 ENCOUNTER — Ambulatory Visit: Payer: BC Managed Care – PPO | Admitting: Orthopaedic Surgery

## 2023-02-01 ENCOUNTER — Encounter (HOSPITAL_COMMUNITY): Payer: Self-pay

## 2023-02-01 ENCOUNTER — Telehealth: Payer: Self-pay

## 2023-02-01 ENCOUNTER — Emergency Department (HOSPITAL_COMMUNITY)
Admission: EM | Admit: 2023-02-01 | Discharge: 2023-02-02 | Disposition: A | Discharge status: Home / Self Care | Payer: BC Managed Care – PPO | Attending: Emergency Medicine | Admitting: Emergency Medicine

## 2023-02-01 ENCOUNTER — Other Ambulatory Visit: Payer: Self-pay

## 2023-02-01 DIAGNOSIS — Z7901 Long term (current) use of anticoagulants: Secondary | ICD-10-CM | POA: Insufficient documentation

## 2023-02-01 DIAGNOSIS — I10 Essential (primary) hypertension: Secondary | ICD-10-CM | POA: Insufficient documentation

## 2023-02-01 DIAGNOSIS — E119 Type 2 diabetes mellitus without complications: Secondary | ICD-10-CM | POA: Diagnosis not present

## 2023-02-01 DIAGNOSIS — R109 Unspecified abdominal pain: Secondary | ICD-10-CM | POA: Diagnosis present

## 2023-02-01 DIAGNOSIS — Z79899 Other long term (current) drug therapy: Secondary | ICD-10-CM | POA: Insufficient documentation

## 2023-02-01 DIAGNOSIS — R1084 Generalized abdominal pain: Secondary | ICD-10-CM | POA: Diagnosis not present

## 2023-02-01 DIAGNOSIS — Z96653 Presence of artificial knee joint, bilateral: Secondary | ICD-10-CM | POA: Diagnosis not present

## 2023-02-01 DIAGNOSIS — Z8673 Personal history of transient ischemic attack (TIA), and cerebral infarction without residual deficits: Secondary | ICD-10-CM | POA: Insufficient documentation

## 2023-02-01 LAB — LIPASE, BLOOD: Lipase: 51 U/L (ref 11–51)

## 2023-02-01 LAB — COMPREHENSIVE METABOLIC PANEL
ALT: 22 U/L (ref 0–44)
AST: 23 U/L (ref 15–41)
Albumin: 3.5 g/dL (ref 3.5–5.0)
Alkaline Phosphatase: 38 U/L (ref 38–126)
Anion gap: 8 (ref 5–15)
BUN: 10 mg/dL (ref 8–23)
CO2: 26 mmol/L (ref 22–32)
Calcium: 9.5 mg/dL (ref 8.9–10.3)
Chloride: 105 mmol/L (ref 98–111)
Creatinine, Ser: 1.05 mg/dL — ABNORMAL HIGH (ref 0.44–1.00)
GFR, Estimated: 57 mL/min — ABNORMAL LOW (ref >60)
Glucose, Bld: 123 mg/dL — ABNORMAL HIGH (ref 70–99)
Potassium: 3.8 mmol/L (ref 3.5–5.1)
Sodium: 139 mmol/L (ref 135–145)
Total Bilirubin: 0.4 mg/dL (ref 0.3–1.2)
Total Protein: 5.8 g/dL — ABNORMAL LOW (ref 6.5–8.1)

## 2023-02-01 LAB — CBC
HCT: 37.4 % (ref 36.0–46.0)
Hemoglobin: 12.2 g/dL (ref 12.0–15.0)
MCH: 32.5 pg (ref 26.0–34.0)
MCHC: 32.6 g/dL (ref 30.0–36.0)
MCV: 99.7 fL (ref 80.0–100.0)
Platelets: 149 10*3/uL — ABNORMAL LOW (ref 150–400)
RBC: 3.75 MIL/uL — ABNORMAL LOW (ref 3.87–5.11)
RDW: 12.5 % (ref 11.5–15.5)
WBC: 5.6 10*3/uL (ref 4.0–10.5)
nRBC: 0 % (ref 0.0–0.2)

## 2023-02-01 NOTE — ED Triage Notes (Signed)
Pt c/o abd pain x several month, worse today with dizziness; denies N/V; hx diverticulitis; denies urinary issues, denies fevers

## 2023-02-01 NOTE — ED Provider Triage Note (Signed)
Emergency Medicine Provider Triage Evaluation Note  Rachel Black , a 72 y.o. female  was evaluated in triage.  Patient complains of generalized abdominal discomfort.  Has been going on for several months.  Reports multiple visits but says nobody is able to say what is going on.  Review of Systems  Positive:  Negative:   Physical Exam  BP 108/70   Pulse 81   Temp 98.5 F (36.9 C) (Oral)   Resp 18   SpO2 98%  Gen:   Awake, no distress   Resp:  Normal effort  MSK:   Moves extremities without difficulty  Other:  Generalized tenderness  Medical Decision Making  Medically screening exam initiated at 6:27 PM.  Appropriate orders placed.  [name] was informed that the remainder of the evaluation will be completed by another provider, this initial triage assessment does not replace that evaluation, and the importance of remaining in the ED until their evaluation is complete.    Saddie Benders, PA-C 02/01/23 1827

## 2023-02-01 NOTE — Transitions of Care (Post Inpatient/ED Visit) (Signed)
02/01/2023  Name: Rachel Black MRN: [id number] DOB: April 15, 1951  Today's TOC FU Call Status: Today's TOC FU Call Status:: Successful TOC FU Call Competed TOC FU Call Complete Date: 02/01/23  Transition Care Management Follow-up Telephone Call Date of Discharge: 01/31/23 Discharge Facility: [facility]) Type of Discharge: Inpatient Admission Primary Inpatient Discharge Diagnosis:: abd pain Reason for ED Visit: Other: (abd pain) How have you been since you were released from the hospital?: Same Any questions or concerns?: No  Items Reviewed: Did you receive and understand the discharge instructions provided?: Yes Medications obtained,verified, and reconciled?: Yes (Medications Reviewed) Any new allergies since your discharge?: No Dietary orders reviewed?: Yes Do you have support at home?: Yes People in Home: spouse  Medications Reviewed Today: Medications Reviewed Today     Reviewed by Karena Addison, LPN (Licensed Practical Nurse) on 02/01/23 at 1052  Med List Status: <None>   Medication Order Taking? Sig Documenting Provider Last Dose Status Informant  ACCU-CHEK AVIVA PLUS test strip [id number] Yes USE AS INSTRUCTED ONCE A DAY. DX CODE E11.9 Mayer Masker, PA-C Taking Active Spouse/Significant Other  Accu-Chek Softclix Lancets lancets [id number] Yes Use as instructed once a day.  DX Code: E11.9 Mayer Masker, PA-C Taking Active Spouse/Significant Other  acetaminophen (TYLENOL) 500 MG tablet [id number] Yes Take 1,000 mg by mouth every 6 (six) hours as needed for mild pain or moderate pain. [provider] Taking Active Spouse/Significant Other  allopurinol (ZYLOPRIM) 300 MG tablet [id number] Yes TAKE 1 TABLET BY MOUTH TWICE A DAY Boscia, Heather E, NP Taking Active Spouse/Significant Other  ALPRAZolam (XANAX) 0.25 MG tablet [id number] Yes Take 1 tablet (0.25 mg total) by mouth 2 (two) times daily for 7 days. Lorin Glass, MD Taking Active   amoxicillin-clavulanate  (AUGMENTIN) 875-125 MG tablet [id number] Yes Take 1 tablet by mouth every 12 (twelve) hours for 10 days. Lorin Glass, MD Taking Active   apixaban (ELIQUIS) 5 MG TABS tablet [id number] Yes Take 1 tablet (5 mg total) by mouth 2 (two) times daily. Drema Dallas, MD Taking Active   CALCIUM PO [id number] Yes Take 2 tablets by mouth daily. [provider] Taking Active Spouse/Significant Other  dicyclomine (BENTYL) 20 MG tablet [id number] Yes Take 1 tablet (20 mg total) by mouth 3 (three) times daily with meals as needed for up to 7 days for spasms. Lorin Glass, MD Taking Active   Emollient (UDDERLY SMOOTH) CREA [id number] Yes Apply 1 application topically See admin instructions. Apply topically to skin folds as needed for rash/irritation [provider] Taking Active Spouse/Significant Other  famotidine (PEPCID) 10 MG tablet [id number] Yes Take 10 mg by mouth daily as needed for heartburn or indigestion. [provider] Taking Active Spouse/Significant Other  fenofibrate 54 MG tablet [id number] Yes Take 1 tablet (54 mg total) by mouth daily. Drema Dallas, MD Taking Active   ferrous sulfate 325 (65 FE) MG tablet [id number] Yes TAKE 1 TABLET BY MOUTH TWICE A DAY  Patient taking differently: Take 325 mg by mouth daily.   Zehr, Princella Pellegrini, PA-C Taking Active Spouse/Significant Other  gabapentin (NEURONTIN) 300 MG capsule [id number] Yes Take 1 capsule (300 mg total) by mouth at bedtime. Kathryne Hitch, MD Taking Active Spouse/Significant Other  isosorbide mononitrate (IMDUR) 30 MG 24 hr tablet [id number] Yes Take 30 mg by mouth daily. [provider] Taking Active Spouse/Significant Other  olmesartan (BENICAR) 5 MG tablet [id number] Yes Take 1 tablet (5 mg total) by mouth daily. Carlean Jews, NP Taking Active  Spouse/Significant Other  ondansetron (ZOFRAN) 4 MG tablet [id number] Yes Take 1 tablet (4 mg total) by mouth every 6 (six) hours as needed for up to 5 days for  nausea. Lorin Glass, MD Taking Active   oxyCODONE (ROXICODONE) 5 MG immediate release tablet [id number] Yes Take 1 tablet (5 mg total) by mouth every 8 (eight) hours as needed for up to 5 days. Lorin Glass, MD Taking Active   pantoprazole (PROTONIX) 40 MG tablet [id number] Yes Take 40 mg by mouth daily. [provider] Taking Active Spouse/Significant Other  rosuvastatin (CRESTOR) 40 MG tablet [id number] Yes TAKE 1 TABLET BY MOUTH EVERY DAY Rollene Rotunda, MD Taking Active Spouse/Significant Other            Home Care and Equipment/Supplies: Were Home Health Services Ordered?: NA Any new equipment or medical supplies ordered?: NA  Functional Questionnaire: Do you need assistance with bathing/showering or dressing?: No Do you need assistance with meal preparation?: No Do you need assistance with eating?: No Do you have difficulty maintaining continence: No Do you have difficulty managing or taking your medications?: No  Follow up appointments reviewed: PCP Follow-up appointment confirmed?: Yes Date of PCP follow-up appointment?: 02/02/23 Follow-up Provider: Saralyn Pilar Specialist [facility] Follow-up appointment confirmed?: Yes Date of Specialist follow-up appointment?: 02/22/23 Follow-Up Specialty Provider:: Dr Michaell Cowing Do you need transportation to your follow-up appointment?: No Do you understand care options if your condition(s) worsen?: Yes-patient verbalized understanding    SIGNATURE Karena Addison, LPN [facility] Nurse Health Advisor Direct Dial [phone]

## 2023-02-02 ENCOUNTER — Other Ambulatory Visit: Payer: Self-pay

## 2023-02-02 ENCOUNTER — Encounter: Payer: BC Managed Care – PPO | Admitting: Physical Therapy

## 2023-02-02 ENCOUNTER — Encounter (HOSPITAL_COMMUNITY): Payer: Self-pay

## 2023-02-02 ENCOUNTER — Emergency Department (HOSPITAL_COMMUNITY): Payer: BC Managed Care – PPO

## 2023-02-02 ENCOUNTER — Ambulatory Visit: Payer: BC Managed Care – PPO | Admitting: Family Medicine

## 2023-02-02 ENCOUNTER — Emergency Department (HOSPITAL_COMMUNITY)
Admission: EM | Admit: 2023-02-02 | Discharge: 2023-02-02 | Disposition: A | Discharge status: Home / Self Care | Payer: BC Managed Care – PPO | Source: Home / Self Care | Attending: Emergency Medicine | Admitting: Emergency Medicine

## 2023-02-02 DIAGNOSIS — R1084 Generalized abdominal pain: Secondary | ICD-10-CM | POA: Insufficient documentation

## 2023-02-02 DIAGNOSIS — I10 Essential (primary) hypertension: Secondary | ICD-10-CM | POA: Insufficient documentation

## 2023-02-02 DIAGNOSIS — Z7901 Long term (current) use of anticoagulants: Secondary | ICD-10-CM | POA: Insufficient documentation

## 2023-02-02 DIAGNOSIS — Z79899 Other long term (current) drug therapy: Secondary | ICD-10-CM | POA: Insufficient documentation

## 2023-02-02 DIAGNOSIS — E119 Type 2 diabetes mellitus without complications: Secondary | ICD-10-CM | POA: Insufficient documentation

## 2023-02-02 LAB — URINALYSIS, ROUTINE W REFLEX MICROSCOPIC
Bilirubin Urine: NEGATIVE
Bilirubin Urine: NEGATIVE
Glucose, UA: NEGATIVE mg/dL
Glucose, UA: NEGATIVE mg/dL
Hgb urine dipstick: NEGATIVE
Hgb urine dipstick: NEGATIVE
Ketones, ur: 5 mg/dL — AB
Ketones, ur: NEGATIVE mg/dL
Leukocytes,Ua: NEGATIVE
Leukocytes,Ua: NEGATIVE
Nitrite: NEGATIVE
Nitrite: NEGATIVE
Protein, ur: NEGATIVE mg/dL
Protein, ur: NEGATIVE mg/dL
Specific Gravity, Urine: 1.024 (ref 1.005–1.030)
Specific Gravity, Urine: 1.028 (ref 1.005–1.030)
pH: 5 (ref 5.0–8.0)
pH: 6 (ref 5.0–8.0)

## 2023-02-02 LAB — COMPREHENSIVE METABOLIC PANEL
ALT: 21 U/L (ref 0–44)
AST: 19 U/L (ref 15–41)
Albumin: 3.6 g/dL (ref 3.5–5.0)
Alkaline Phosphatase: 42 U/L (ref 38–126)
Anion gap: 9 (ref 5–15)
BUN: 10 mg/dL (ref 8–23)
CO2: 25 mmol/L (ref 22–32)
Calcium: 9.6 mg/dL (ref 8.9–10.3)
Chloride: 106 mmol/L (ref 98–111)
Creatinine, Ser: 0.96 mg/dL (ref 0.44–1.00)
GFR, Estimated: >60 mL/min (ref >60)
Glucose, Bld: 106 mg/dL — ABNORMAL HIGH (ref 70–99)
Potassium: 3.8 mmol/L (ref 3.5–5.1)
Sodium: 140 mmol/L (ref 135–145)
Total Bilirubin: 0.9 mg/dL (ref 0.3–1.2)
Total Protein: 5.9 g/dL — ABNORMAL LOW (ref 6.5–8.1)

## 2023-02-02 LAB — CBC WITH DIFFERENTIAL/PLATELET
Abs Immature Granulocytes: 0.02 10*3/uL (ref 0.00–0.07)
Basophils Absolute: 0 10*3/uL (ref 0.0–0.1)
Basophils Relative: 0 %
Eosinophils Absolute: 0 10*3/uL (ref 0.0–0.5)
Eosinophils Relative: 0 %
HCT: 36.5 % (ref 36.0–46.0)
Hemoglobin: 11.9 g/dL — ABNORMAL LOW (ref 12.0–15.0)
Immature Granulocytes: 0 %
Lymphocytes Relative: 26 %
Lymphs Abs: 1.4 10*3/uL (ref 0.7–4.0)
MCH: 31.3 pg (ref 26.0–34.0)
MCHC: 32.6 g/dL (ref 30.0–36.0)
MCV: 96.1 fL (ref 80.0–100.0)
Monocytes Absolute: 0.2 10*3/uL (ref 0.1–1.0)
Monocytes Relative: 4 %
Neutro Abs: 3.6 10*3/uL (ref 1.7–7.7)
Neutrophils Relative %: 70 %
Platelets: 146 10*3/uL — ABNORMAL LOW (ref 150–400)
RBC: 3.8 MIL/uL — ABNORMAL LOW (ref 3.87–5.11)
RDW: 12.5 % (ref 11.5–15.5)
WBC: 5.2 10*3/uL (ref 4.0–10.5)
nRBC: 0 % (ref 0.0–0.2)

## 2023-02-02 LAB — LIPASE, BLOOD: Lipase: 45 U/L (ref 11–51)

## 2023-02-02 MED ORDER — IOHEXOL 350 MG/ML SOLN
75.0000 mL | Freq: Once | INTRAVENOUS | Status: AC | PRN
  Administered 2023-02-02: 75 mL via INTRAVENOUS

## 2023-02-02 MED ORDER — FENTANYL CITRATE PF 50 MCG/ML IJ SOSY
50.0000 ug | PREFILLED_SYRINGE | Freq: Once | INTRAMUSCULAR | Status: AC
  Administered 2023-02-02: 50 ug via INTRAVENOUS
  Filled 2023-02-02: qty 1

## 2023-02-02 MED ORDER — SODIUM CHLORIDE 0.9 % IV BOLUS
1000.0000 mL | Freq: Once | INTRAVENOUS | Status: AC
  Administered 2023-02-02: 1000 mL via INTRAVENOUS

## 2023-02-02 NOTE — Discharge Instructions (Signed)
Today's labs actually improved from yesterday.  No significant abnormalities.  Would recommend following up with primary care provider also make an appointment with [facility] gastroenterology.  Return for new or worse symptoms.

## 2023-02-02 NOTE — ED Provider Notes (Signed)
[facility]-EMERGENCY DEPT [facility] Emergency Department Provider Note MRN:  [id number]  Arrival date & time: 02/02/23     Chief Complaint   Abdominal pain History of Present Illness   Rachel Black is a 72 y.o. year-old female with a history of diabetes, stroke, hypertension presenting to the ED with chief complaint of abdominal pain.  Continue to have abdominal pain.  For over a month.  Recent diverticulitis.  Pain worsened this evening.  Review of Systems  A thorough review of systems was obtained and all systems are negative except as noted in the HPI and PMH.   Patient's Health History    Past Medical History:  Diagnosis Date   Arthritis    PAIN AND OA BOTH KNEES AND SHOULDERS AND ELBOWS AND WRIST   Blood transfusion without reported diagnosis 2018   after cholecystectomy   Broken foot, right, closed, initial encounter 09/15/2021   no surgery needed, fell at home and went to ED   Diabetes mellitus    ORAL MEDICATION   Diverticulitis    GERD (gastroesophageal reflux disease)    Gout    NO RECENT FLARE UPS   H/O: rheumatic fever    AS A CHILD - NO KNOWN HEART MURMUR OR HEART PROBLEMS   Heart attack (HCC)    Hyperlipidemia    Hypertension    PFO (patent foramen ovale)    Stroke (HCC) 03/2017    Past Surgical History:  Procedure Laterality Date   ABDOMINAL HYSTERECTOMY     CESAREAN SECTION     CHOLECYSTECTOMY N/A 12/09/2016   Procedure: LAPAROSCOPIC CHOLECYSTECTOMY WITH INTRAOPERATIVE CHOLANGIOGRAM;  Surgeon: Almond Lint, MD;  Location: [facility] ORS;  Service: General;  Laterality: N/A;   COLONOSCOPY     COLONOSCOPY WITH PROPOFOL N/A 01/30/2013   Procedure: COLONOSCOPY WITH PROPOFOL;  Surgeon: Hilarie Fredrickson, MD;  Location: [facility] ENDOSCOPY;  Service: Endoscopy;  Laterality: N/A;   ERCP N/A 12/08/2016   Procedure: ENDOSCOPIC RETROGRADE CHOLANGIOPANCREATOGRAPHY (ERCP);  Surgeon: Meryl Dare, MD;  Location: [facility];  Service: Endoscopy;  Laterality: N/A;    ESOPHAGOGASTRODUODENOSCOPY (EGD) WITH PROPOFOL N/A 01/30/2013   Procedure: ESOPHAGOGASTRODUODENOSCOPY (EGD) WITH PROPOFOL;  Surgeon: Hilarie Fredrickson, MD;  Location: [facility] ENDOSCOPY;  Service: Endoscopy;  Laterality: N/A;   LEFT HEART CATH AND CORONARY ANGIOGRAPHY N/A 10/18/2017   Procedure: LEFT HEART CATH AND CORONARY ANGIOGRAPHY;  Surgeon: [place], Peter M, MD;  Location: [facility] INVASIVE CV LAB;  Service: Cardiovascular;  Laterality: N/A;   LOOP RECORDER INSERTION N/A 03/18/2017   Procedure: Loop Recorder Insertion;  Surgeon: Regan Lemming, MD;  Location: [facility] INVASIVE CV LAB;  Service: Cardiovascular;  Laterality: N/A;   PARTIAL HYSTERECTOMY     TEE WITHOUT CARDIOVERSION N/A 03/16/2017   Procedure: TRANSESOPHAGEAL ECHOCARDIOGRAM (TEE);  Surgeon: Elease Hashimoto Deloris Ping, MD;  Location: [facility] ENDOSCOPY;  Service: Cardiovascular;  Laterality: N/A;   TOTAL KNEE ARTHROPLASTY Right 04/20/2014   Procedure: RIGHT TOTAL KNEE ARTHROPLASTY CONVERTED TO RIGHT KNEE REIMPLANTATION;  Surgeon: Kathryne Hitch, MD;  Location: [facility] ORS;  Service: Orthopedics;  Laterality: Right;   TOTAL KNEE ARTHROPLASTY Left 08/23/2015   Procedure: LEFT TOTAL KNEE ARTHROPLASTY;  Surgeon: Kathryne Hitch, MD;  Location: [facility];  Service: Orthopedics;  Laterality: Left;   VENTRAL HERNIA REPAIR     x2    Family History  Problem Relation Age of Onset   Diabetes Mother    Hypertension Mother        entire family   Breast cancer Mother  diagnosed in her 84's   Stroke Father        CVA   Hyperlipidemia Father        entire family   Diabetes Father    Heart disease Father        No details.  Not at an early age.     Crohn's disease Son    Irritable bowel syndrome Son    Colon cancer Neg Hx    Colon polyps Neg Hx    Esophageal cancer Neg Hx    Stomach cancer Neg Hx    Rectal cancer Neg Hx     Social History   Socioeconomic History   Marital status: Married    Spouse name: Dorene Sorrow   Number of children: 2   Years  of education: Not on file   Highest education level: Not on file  Occupational History   Occupation: housewife    Employer: UNEMPLOYED  Tobacco Use   Smoking status: Never   Smokeless tobacco: Never  Vaping Use   Vaping Use: Never used  Substance and Sexual Activity   Alcohol use: Not Currently    Comment: rarely in the past   Drug use: Never   Sexual activity: Yes    Partners: Male  Other Topics Concern   Not on file  Social History Narrative   No exercise secondary to knee pain   Social Determinants of Health   Financial Resource Strain: Not on file  Food Insecurity: No Food Insecurity (01/27/2023)   Hunger Vital Sign    Worried About Running Out of Food in the Last Year: Never true    Ran Out of Food in the Last Year: Never true  Transportation Needs: No Transportation Needs (01/27/2023)   PRAPARE - Administrator, Civil Service (Medical): No    Lack of Transportation (Non-Medical): No  Physical Activity: Not on file  Stress: Not on file  Social Connections: Not on file  Intimate Partner Violence: Not At Risk (01/27/2023)   Humiliation, Afraid, Rape, and Kick questionnaire    Fear of Current or Ex-Partner: No    Emotionally Abused: No    Physically Abused: No    Sexually Abused: No     Physical Exam   Vitals:   02/01/23 2201 02/02/23 0143  BP: 115/80 129/69  Pulse: 66 (!) 58  Resp: 16 18  Temp: 98.1 F (36.7 C) 97.7 F (36.5 C)  SpO2: 100% 100%    CONSTITUTIONAL: Well-appearing, NAD NEURO/PSYCH:  Alert and oriented x 3, no focal deficits EYES:  eyes equal and reactive ENT/NECK:  no LAD, no JVD CARDIO: Regular rate, well-perfused, normal S1 and S2 PULM:  CTAB no wheezing or rhonchi GI/GU:  non-distended, non-tender MSK/SPINE:  No gross deformities, no edema SKIN:  no rash, atraumatic   *Additional and/or pertinent findings included in MDM below  Diagnostic and Interventional Summary    EKG Interpretation  Date/Time:    Ventricular  Rate:    PR Interval:    QRS Duration:   QT Interval:    QTC Calculation:   R Axis:     Text Interpretation:         Labs Reviewed  COMPREHENSIVE METABOLIC PANEL - Abnormal; Notable for the following components:      Result Value   Glucose, Bld 123 (*)    Creatinine, Ser 1.05 (*)    Total Protein 5.8 (*)    GFR, Estimated 57 (*)    All other components within normal limits  CBC - Abnormal; Notable for the following components:   RBC 3.75 (*)    Platelets 149 (*)    All other components within normal limits  URINALYSIS, ROUTINE W REFLEX MICROSCOPIC - Abnormal; Notable for the following components:   APPearance HAZY (*)    Ketones, ur 5 (*)    All other components within normal limits  LIPASE, BLOOD    CT ABDOMEN PELVIS W CONTRAST  Final Result      Medications  fentaNYL (SUBLIMAZE) injection 50 mcg (50 mcg Intravenous Given 02/02/23 0249)  sodium chloride 0.9 % bolus 1,000 mL (1,000 mLs Intravenous New Bag/Given 02/02/23 0248)  iohexol (OMNIPAQUE) 350 MG/ML injection 75 mL (75 mLs Intravenous Contrast Given 02/02/23 0301)     Procedures  /  Critical Care Procedures  ED Course and Medical Decision Making  Initial Impression and Ddx Differential diagnosis includes complications of diverticulitis such as an abscess.  Has had a few recent ED visits with reassuring imaging and evaluation.  Perhaps this is becoming more of a chronic pain situation.  Past medical/surgical history that increases complexity of ED encounter: None  Interpretation of Diagnostics I personally reviewed the laboratory assessment and my interpretation is as follows: No significant blood count or electrolyte disturbance  CTs again without acute process.  Patient Reassessment and Ultimate Disposition/Management     Patient sitting comfortably with normal vital signs, appropriate for discharge, will refer to gastroenterology.  Patient management required discussion with the following services or  consulting groups:  None  Complexity of Problems Addressed Acute illness or injury that poses threat of life of bodily function  Additional Data Reviewed and Analyzed Further history obtained from: Further history from spouse/family member  Additional Factors Impacting ED Encounter Risk Use of parenteral controlled substances  Elmer Sow. Pilar Plate, MD [facility] Emergency Medicine [facility] [email or web address]  Final Clinical Impressions(s) / ED Diagnoses     ICD-10-CM   1. Generalized abdominal pain  R10.84       ED Discharge Orders     None        Discharge Instructions Discussed with and Provided to Patient:     Discharge Instructions      You were evaluated in the Emergency Department and after careful evaluation, we did not find any emergent condition requiring admission or further testing in the hospital.  Your exam/testing today is overall reassuring.  Recommend follow-up with the GI doctors to discuss her symptoms.  Please return to the Emergency Department if you experience any worsening of your condition.   Thank you for allowing [place] to be a part of your care.       Sabas Sous, MD 02/02/23 0400

## 2023-02-02 NOTE — ED Triage Notes (Signed)
Pt c/o generalized abd pain, but more so the upper abdomen, dizzinessx3wks. Pt denies N/V/D.

## 2023-02-02 NOTE — Discharge Instructions (Signed)
You were evaluated in the Emergency Department and after careful evaluation, we did not find any emergent condition requiring admission or further testing in the hospital.  Your exam/testing today is overall reassuring.  Recommend follow-up with the GI doctors to discuss her symptoms.  Please return to the Emergency Department if you experience any worsening of your condition.   Thank you for allowing [place] to be a part of your care.

## 2023-02-02 NOTE — ED Provider Notes (Signed)
[place] EMERGENCY DEPARTMENT AT [facility] Provider Note   CSN: [id number] Arrival date & time: 02/02/23  1650     History  Chief Complaint  Patient presents with   Abdominal Pain    Rachel LACOE is a 72 y.o. female.  Patient was here last evening discharged about 4 in the morning.  Had labs and had CT of the abdomen without any acute findings.  Patient is having persistent generalized abdominal pain.  Did extensive chart review patient has had diverticulitis but had CT scan on May 15 and 21 actually today that both were negative.  Patient is concerned about having trouble with diverticulitis before she also had a scan in the past that showed evidence of colitis.  Patient was admitted May 15 to May 19 without an extensive workup.  She is followed by [facility] gastroenterology Dr. Marina Goodell.  Patient did have a CT scan consistent with diverticulitis without complications on May 10.  According to her recent admission past medical history significant for type 2 diabetes hypertension hyperlipidemia history rheumatic heart disease coronary artery disease stroke GERD gout and a recent PE.  Patient was admitted 3 times since January 2024 last hospitalization was May 10 through May 14 for acute sigmoid diverticulitis and some sigmoid colitis as evidenced on CT scan failed treatment as an outpatient.  Patient was initially started on IV Zosyn.  Patient was admitted again as mentioned May 15 through May 19 without any significant findings.  Patient denies any nausea vomiting but has generalized abdominal pain feels as if something is wrong.  Today patient describes generalized abdominal pain no nausea no vomiting no fevers no diarrhea.  No blood in bowel movements.  As stated patient just seen at 1800 but was actually seen after midnight labs were done and CT scan was done without any acute findings.  Surgical history significant for ventral hernia repair abdominal hysterectomy gallbladder  removal left heart cath 2019.  Patient is never used tobacco products.       Home Medications Prior to Admission medications   Medication Sig Start Date End Date Taking? Authorizing Provider  ACCU-CHEK AVIVA PLUS test strip USE AS INSTRUCTED ONCE A DAY. DX CODE E11.9 07/20/22   Mayer Masker, PA-C  Accu-Chek Softclix Lancets lancets Use as instructed once a day.  DX Code: E11.9 03/24/22   Mayer Masker, PA-C  acetaminophen (TYLENOL) 500 MG tablet Take 1,000 mg by mouth every 6 (six) hours as needed for mild pain or moderate pain.    [provider]  allopurinol (ZYLOPRIM) 300 MG tablet TAKE 1 TABLET BY MOUTH TWICE A DAY 08/27/22   Carlean Jews, NP  ALPRAZolam (XANAX) 0.25 MG tablet Take 1 tablet (0.25 mg total) by mouth 2 (two) times daily for 7 days. 01/31/23 02/07/23  Lorin Glass, MD  amoxicillin-clavulanate (AUGMENTIN) 875-125 MG tablet Take 1 tablet by mouth every 12 (twelve) hours for 10 days. 01/29/23 02/08/23  Lorin Glass, MD  apixaban (ELIQUIS) 5 MG TABS tablet Take 1 tablet (5 mg total) by mouth 2 (two) times daily. 01/26/23   Drema Dallas, MD  CALCIUM PO Take 2 tablets by mouth daily.    [provider]  dicyclomine (BENTYL) 20 MG tablet Take 1 tablet (20 mg total) by mouth 3 (three) times daily with meals as needed for up to 7 days for spasms. 01/31/23 02/07/23  Lorin Glass, MD  Emollient (UDDERLY SMOOTH) CREA Apply 1 application topically See admin instructions. Apply topically to skin  folds as needed for rash/irritation    [provider]  famotidine (PEPCID) 10 MG tablet Take 10 mg by mouth daily as needed for heartburn or indigestion.    [provider]  fenofibrate 54 MG tablet Take 1 tablet (54 mg total) by mouth daily. 01/26/23   Drema Dallas, MD  ferrous sulfate 325 (65 FE) MG tablet TAKE 1 TABLET BY MOUTH TWICE A DAY Patient taking differently: Take 325 mg by mouth daily. 08/03/22   Zehr, Princella Pellegrini, PA-C  gabapentin (NEURONTIN)  300 MG capsule Take 1 capsule (300 mg total) by mouth at bedtime. 12/21/22   Kathryne Hitch, MD  isosorbide mononitrate (IMDUR) 30 MG 24 hr tablet Take 30 mg by mouth daily. 11/30/22   [provider]  olmesartan (BENICAR) 5 MG tablet Take 1 tablet (5 mg total) by mouth daily. 10/23/22   Carlean Jews, NP  ondansetron (ZOFRAN) 4 MG tablet Take 1 tablet (4 mg total) by mouth every 6 (six) hours as needed for up to 5 days for nausea. 01/29/23 02/03/23  Lorin Glass, MD  oxyCODONE (ROXICODONE) 5 MG immediate release tablet Take 1 tablet (5 mg total) by mouth every 8 (eight) hours as needed for up to 5 days. 01/31/23 02/05/23  Lorin Glass, MD  pantoprazole (PROTONIX) 40 MG tablet Take 40 mg by mouth daily. 12/26/22   [provider]  rosuvastatin (CRESTOR) 40 MG tablet TAKE 1 TABLET BY MOUTH EVERY DAY 01/04/23   Rollene Rotunda, MD      Allergies    Other and Penicillins    Review of Systems   Review of Systems  Constitutional:  Negative for chills and fever.  HENT:  Negative for ear pain, rhinorrhea and sore throat.   Eyes:  Negative for pain and visual disturbance.  Respiratory:  Negative for cough and shortness of breath.   Cardiovascular:  Negative for chest pain, palpitations and leg swelling.  Gastrointestinal:  Positive for abdominal pain. Negative for diarrhea, nausea and vomiting.  Genitourinary:  Negative for dysuria and hematuria.  Musculoskeletal:  Negative for arthralgias, back pain and neck pain.  Skin:  Negative for color change and rash.  Neurological:  Negative for dizziness, seizures, syncope, light-headedness and headaches.  Hematological:  Does not bruise/bleed easily.  Psychiatric/Behavioral:  Negative for confusion.   All other systems reviewed and are negative.   Physical Exam Updated Vital Signs BP 121/82   Pulse 95   Temp 99.2 F (37.3 C) (Oral)   Resp 16   Ht 1.676 m (5\' 6" )   Wt 83.1 kg   SpO2 97%   BMI 29.57 kg/m  Physical  Exam Vitals and nursing note reviewed.  Constitutional:      General: She is not in acute distress.    Appearance: She is well-developed.  HENT:     Head: Normocephalic and atraumatic.  Eyes:     Conjunctiva/sclera: Conjunctivae normal.  Cardiovascular:     Rate and Rhythm: Normal rate and regular rhythm.     Heart sounds: No murmur heard. Pulmonary:     Effort: Pulmonary effort is normal. No respiratory distress.     Breath sounds: Normal breath sounds.  Abdominal:     General: There is no distension.     Palpations: Abdomen is soft.     Tenderness: There is no abdominal tenderness. There is no guarding.  Musculoskeletal:        General: No swelling.     Cervical back: Neck supple.  Skin:    General: Skin is warm and dry.     Capillary Refill: Capillary refill takes less than 2 seconds.  Neurological:     Mental Status: She is alert.  Psychiatric:        Mood and Affect: Mood normal.     ED Results / Procedures / Treatments   Labs (all labs ordered are listed, but only abnormal results are displayed) Labs Reviewed  COMPREHENSIVE METABOLIC PANEL - Abnormal; Notable for the following components:      Result Value   Glucose, Bld 106 (*)    Total Protein 5.9 (*)    All other components within normal limits  CBC WITH DIFFERENTIAL/PLATELET - Abnormal; Notable for the following components:   RBC 3.80 (*)    Hemoglobin 11.9 (*)    Platelets 146 (*)    All other components within normal limits  URINALYSIS, ROUTINE W REFLEX MICROSCOPIC - Abnormal; Notable for the following components:   APPearance HAZY (*)    All other components within normal limits  LIPASE, BLOOD    EKG None  Radiology CT ABDOMEN PELVIS W CONTRAST  Result Date: 02/02/2023 CLINICAL DATA:  Abdominal pain, acute, nonlocalized EXAM: CT ABDOMEN AND PELVIS WITH CONTRAST TECHNIQUE: Multidetector CT imaging of the abdomen and pelvis was performed using the standard protocol following bolus administration of  intravenous contrast. RADIATION DOSE REDUCTION: This exam was performed according to the departmental dose-optimization program which includes automated exposure control, adjustment of the mA and/or kV according to patient size and/or use of iterative reconstruction technique. CONTRAST:  75mL OMNIPAQUE IOHEXOL 350 MG/ML SOLN COMPARISON:  01/27/2023 FINDINGS: Lower chest: Visualized lung bases are clear. Small hiatal hernia. Cardiac size within normal limits. Hepatobiliary: Status post cholecystectomy. Mild pneumobilia again noted within a nondilated intrahepatic biliary tree in keeping with prior sphincterotomy. The liver is otherwise unremarkable. Pancreas: Unremarkable Spleen: Unremarkable Adrenals/Urinary Tract: The adrenal glands are unremarkable. The kidneys are normal in size and position. Simple cortical cyst noted within the interpolar region of the left kidney for which no follow-up imaging is recommended. The kidneys are otherwise unremarkable. Bladder unremarkable. Stomach/Bowel: Moderate sigmoid diverticulosis. The stomach, small bowel, and large bowel are otherwise unremarkable. Appendix normal. No free intraperitoneal gas or fluid. Vascular/Lymphatic: Aortic atherosclerosis. No enlarged abdominal or pelvic lymph nodes. Reproductive: Status post hysterectomy. No adnexal masses. Other: No abdominal wall hernia or abnormality. No abdominopelvic ascites. Musculoskeletal: Degenerative changes seen within the lumbar spine with grade 1 anterolisthesis L3-4. No acute bone abnormality. No lytic or blastic bone lesion. IMPRESSION: 1. No acute intra-abdominal pathology identified. No definite radiographic explanation for the patient's reported symptoms. 2. Small hiatal hernia. 3. Moderate sigmoid diverticulosis without superimposed acute inflammatory change. 4. Aortic atherosclerosis. Aortic Atherosclerosis (ICD10-I70.0). Electronically Signed   By: Helyn Numbers M.D.   On: 02/02/2023 03:19     Procedures Procedures    Medications Ordered in ED Medications - No data to display  ED Course/ Medical Decision Making/ A&P                             Medical Decision Making Amount and/or Complexity of Data Reviewed Labs: ordered.   Patient really wanted repeat CT scan.  But she just has had 1 within the last 24 hours that was negative.  Told her that we would repeat labs to make sure that there was no significant changes and if there was no changes and CT scan is not  indicated patient's had a lot of CT scans here recently lots of radiation exposure.  Clinically patient appears stable no acute distress.  Complete metabolic panel without any significant abnormalities renal function improved for GFR greater than 60.  Lipase was not elevated.  Liver function test were normal.  CBC white count was 5.2 hemoglobin 11.9 no significant changes it was 12.1 before platelets are 146.  Urinalysis again negative.  Had long discussion with patient's husband and her send would recommend follow-up with primary care doctor and back with Dr. Marina Goodell from [facility].  It is a bit of a dilemma for the patient because she has had diverticulitis in the past.  Told her that we would expect new or worse symptoms.  And that repeating the CT scan right now is not clinically warranted.   Final Clinical Impression(s) / ED Diagnoses Final diagnoses:  Generalized abdominal pain    Rx / DC Orders ED Discharge Orders     None         Vanetta Mulders, MD 02/02/23 [phone]

## 2023-02-04 ENCOUNTER — Encounter: Payer: BC Managed Care – PPO | Admitting: Rehabilitative and Restorative Service Providers"

## 2023-02-04 ENCOUNTER — Other Ambulatory Visit: Payer: Self-pay | Admitting: Nurse Practitioner

## 2023-02-04 DIAGNOSIS — E1159 Type 2 diabetes mellitus with other circulatory complications: Secondary | ICD-10-CM

## 2023-02-09 ENCOUNTER — Encounter: Payer: Self-pay | Admitting: Family Medicine

## 2023-02-09 ENCOUNTER — Ambulatory Visit (INDEPENDENT_AMBULATORY_CARE_PROVIDER_SITE_OTHER): Payer: BC Managed Care – PPO | Admitting: Family Medicine

## 2023-02-09 ENCOUNTER — Encounter: Payer: BC Managed Care – PPO | Admitting: Physical Therapy

## 2023-02-09 VITALS — BP 126/80 | HR 82 | Resp 18 | Ht 66.0 in | Wt 187.0 lb

## 2023-02-09 DIAGNOSIS — R1084 Generalized abdominal pain: Secondary | ICD-10-CM | POA: Diagnosis not present

## 2023-02-09 DIAGNOSIS — I2510 Atherosclerotic heart disease of native coronary artery without angina pectoris: Secondary | ICD-10-CM | POA: Diagnosis not present

## 2023-02-09 DIAGNOSIS — Z09 Encounter for follow-up examination after completed treatment for conditions other than malignant neoplasm: Secondary | ICD-10-CM

## 2023-02-09 MED ORDER — DICYCLOMINE HCL 20 MG PO TABS
20.0000 mg | ORAL_TABLET | Freq: Three times a day (TID) | ORAL | 0 refills | Status: DC | PRN
Start: 2023-02-09 — End: 2023-02-20

## 2023-02-09 MED ORDER — APIXABAN 5 MG PO TABS: 5.0000 mg | ORAL_TABLET | Freq: Two times a day (BID) | ORAL | 0 refills | Status: DC

## 2023-02-09 NOTE — Progress Notes (Signed)
   Established Patient Office Visit  Subjective   Patient ID: Rachel Black, female    DOB: Feb 19, 1951  Age: 72 y.o. MRN: [id number]  Chief Complaint  Patient presents with   Follow-up         HPI Rachel Black is a 72 y.o. female presenting today for follow up of ED visit 02/02/2023 for generalized abdominal pain.  Patient previously had diverticulitis on May 10, but she did have negative CT scans on May 15 and May 20.   ED vital signs within normal limits, physical exam unremarkable.  ED provider explained that repeat CT scan was not indicated given that she had no change in her symptoms and had a negative CT scan within 24 hours. She is followed by [facility] gastroenterology with Dr. Marina Goodell, next follow-up appointment is 05/04/2023.  Pain has continued to proved since getting home, currently a 3/10.  ROS Negative unless otherwise noted in HPI   Objective:     BP 126/80 (BP Location: Left Arm, Patient Position: Sitting, Cuff Size: Normal)   Pulse 82   Resp 18   Ht 5\' 6"  (1.676 m)   Wt 187 lb (84.8 kg)   SpO2 96%   BMI 30.18 kg/m   Physical Exam Constitutional:      General: She is not in acute distress.    Appearance: Normal appearance.  HENT:     Head: Normocephalic and atraumatic.  Cardiovascular:     Rate and Rhythm: Normal rate and regular rhythm.     Heart sounds: No murmur heard.    No friction rub. No gallop.  Pulmonary:     Effort: Pulmonary effort is normal. No respiratory distress.     Breath sounds: No wheezing, rhonchi or rales.  Abdominal:     General: Bowel sounds are normal. There is no distension.     Palpations: Abdomen is soft.     Tenderness: There is no abdominal tenderness. There is no guarding or rebound.  Skin:    General: Skin is warm and dry.  Neurological:     Mental Status: She is alert and oriented to person, place, and time.     Assessment & Plan:  Hospital discharge follow-up  Coronary artery disease involving native coronary  artery of native heart without angina pectoris Assessment & Plan: Provided refill of Eliquis.  Recommended that she schedule follow-up with her cardiologist as it has been almost a year.  Orders: -     Apixaban; Take 1 tablet (5 mg total) by mouth 2 (two) times daily.  Dispense: 60 tablet; Refill: 0  Generalized abdominal pain Assessment & Plan: Continue watchful waiting.  Encouraged her to pay attention to certain foods, activities, times a day, emotions that may contribute to her abdominal pain.  Encouraged to also monitor diarrhea, constipation, nausea/vomiting for Rachel correlation.  Provided refill of dicyclomine to take as needed for increased pain.  Follow-up with gastroenterology as scheduled.  We may also consider starting an SSRI for IBS.  Orders: -     Dicyclomine HCl; Take 1 tablet (20 mg total) by mouth 3 (three) times daily with meals as needed for up to 7 days for spasms.  Dispense: 21 tablet; Refill: 0    Return in about 3 months (around 05/12/2023) for follow-up for HTN, HLD, DM, fasting blood work 1 week before.    Melida Quitter, PA

## 2023-02-09 NOTE — Assessment & Plan Note (Signed)
Provided refill of Eliquis.  Recommended that she schedule follow-up with her cardiologist as it has been almost a year.

## 2023-02-09 NOTE — Assessment & Plan Note (Signed)
Continue watchful waiting.  Encouraged her to pay attention to certain foods, activities, times a day, emotions that may contribute to her abdominal pain.  Encouraged to also monitor diarrhea, constipation, nausea/vomiting for any correlation.  Provided refill of dicyclomine to take as needed for increased pain.  Follow-up with gastroenterology as scheduled.  We may also consider starting an SSRI for IBS.

## 2023-02-10 ENCOUNTER — Telehealth: Payer: Self-pay | Admitting: *Deleted

## 2023-02-10 NOTE — Telephone Encounter (Signed)
-----   Message from Rollene Rotunda, MD sent at 02/10/2023 11:15 AM EDT ----- We will take care of it thanks.  ----- Message ----- From: Melida Quitter, PA Sent: 02/09/2023   3:06 PM EDT To: Rollene Rotunda, MD  Patient has not been to cardiology since 04/24/2022.  I recommended that she call to schedule appointment but wanted to reach out to make sure that she does get an appointment scheduled!  Thank you so much for your care.

## 2023-02-10 NOTE — Telephone Encounter (Signed)
Left message for pt to call to schedule follow up appointment. 

## 2023-02-15 ENCOUNTER — Other Ambulatory Visit: Payer: Self-pay

## 2023-02-15 ENCOUNTER — Encounter (HOSPITAL_COMMUNITY): Payer: Self-pay

## 2023-02-15 ENCOUNTER — Emergency Department (HOSPITAL_COMMUNITY)
Admission: EM | Admit: 2023-02-15 | Discharge: 2023-02-16 | Disposition: A | Discharge status: Home / Self Care | Payer: BC Managed Care – PPO | Attending: Emergency Medicine | Admitting: Emergency Medicine

## 2023-02-15 DIAGNOSIS — R739 Hyperglycemia, unspecified: Secondary | ICD-10-CM | POA: Diagnosis not present

## 2023-02-15 DIAGNOSIS — R001 Bradycardia, unspecified: Secondary | ICD-10-CM | POA: Diagnosis not present

## 2023-02-15 DIAGNOSIS — R1084 Generalized abdominal pain: Secondary | ICD-10-CM

## 2023-02-15 DIAGNOSIS — T148XXA Other injury of unspecified body region, initial encounter: Secondary | ICD-10-CM

## 2023-02-15 DIAGNOSIS — I10 Essential (primary) hypertension: Secondary | ICD-10-CM | POA: Diagnosis not present

## 2023-02-15 DIAGNOSIS — R110 Nausea: Secondary | ICD-10-CM | POA: Diagnosis not present

## 2023-02-15 DIAGNOSIS — Z7901 Long term (current) use of anticoagulants: Secondary | ICD-10-CM | POA: Diagnosis not present

## 2023-02-15 DIAGNOSIS — R58 Hemorrhage, not elsewhere classified: Secondary | ICD-10-CM | POA: Insufficient documentation

## 2023-02-15 DIAGNOSIS — M25511 Pain in right shoulder: Secondary | ICD-10-CM | POA: Insufficient documentation

## 2023-02-15 DIAGNOSIS — R1011 Right upper quadrant pain: Secondary | ICD-10-CM | POA: Insufficient documentation

## 2023-02-15 LAB — COMPREHENSIVE METABOLIC PANEL
ALT: 16 U/L (ref 0–44)
AST: 19 U/L (ref 15–41)
Albumin: 3.6 g/dL (ref 3.5–5.0)
Alkaline Phosphatase: 38 U/L (ref 38–126)
Anion gap: 9 (ref 5–15)
BUN: 14 mg/dL (ref 8–23)
CO2: 24 mmol/L (ref 22–32)
Calcium: 9.5 mg/dL (ref 8.9–10.3)
Chloride: 108 mmol/L (ref 98–111)
Creatinine, Ser: 0.97 mg/dL (ref 0.44–1.00)
GFR, Estimated: >60 mL/min (ref >60)
Glucose, Bld: 108 mg/dL — ABNORMAL HIGH (ref 70–99)
Potassium: 3.5 mmol/L (ref 3.5–5.1)
Sodium: 141 mmol/L (ref 135–145)
Total Bilirubin: 0.7 mg/dL (ref 0.3–1.2)
Total Protein: 5.8 g/dL — ABNORMAL LOW (ref 6.5–8.1)

## 2023-02-15 LAB — CBC
HCT: 37.1 % (ref 36.0–46.0)
Hemoglobin: 12.4 g/dL (ref 12.0–15.0)
MCH: 31.8 pg (ref 26.0–34.0)
MCHC: 33.4 g/dL (ref 30.0–36.0)
MCV: 95.1 fL (ref 80.0–100.0)
Platelets: 165 10*3/uL (ref 150–400)
RBC: 3.9 MIL/uL (ref 3.87–5.11)
RDW: 13 % (ref 11.5–15.5)
WBC: 5.9 10*3/uL (ref 4.0–10.5)
nRBC: 0 % (ref 0.0–0.2)

## 2023-02-15 LAB — URINALYSIS, ROUTINE W REFLEX MICROSCOPIC
Bilirubin Urine: NEGATIVE
Glucose, UA: NEGATIVE mg/dL
Hgb urine dipstick: NEGATIVE
Ketones, ur: NEGATIVE mg/dL
Leukocytes,Ua: NEGATIVE
Nitrite: NEGATIVE
Protein, ur: NEGATIVE mg/dL
Specific Gravity, Urine: 1.013 (ref 1.005–1.030)
pH: 5 (ref 5.0–8.0)

## 2023-02-15 LAB — LIPASE, BLOOD: Lipase: 43 U/L (ref 11–51)

## 2023-02-15 LAB — TROPONIN I (HIGH SENSITIVITY)
Troponin I (High Sensitivity): 5 ng/L (ref <18)
Troponin I (High Sensitivity): 6 ng/L (ref <18)

## 2023-02-15 NOTE — Telephone Encounter (Signed)
Spoke with pt, aware the PA had sent [place] a message about her getting a follow up appointment. She preferred not to make an appointment at this time.

## 2023-02-15 NOTE — ED Triage Notes (Signed)
Pt arrives with c/o ABD pain that started yesterday. Pt has hx of diverticulitis. Pt endorses bruising on her right upper arm and dizziness. Pt does take Eliquis.

## 2023-02-16 ENCOUNTER — Telehealth: Payer: Self-pay | Admitting: Internal Medicine

## 2023-02-16 ENCOUNTER — Emergency Department (HOSPITAL_COMMUNITY): Payer: BC Managed Care – PPO

## 2023-02-16 MED ORDER — ONDANSETRON 4 MG PO TBDP
4.0000 mg | ORAL_TABLET | Freq: Once | ORAL | Status: AC
  Administered 2023-02-16: 4 mg via ORAL
  Filled 2023-02-16: qty 1

## 2023-02-16 MED ORDER — DICYCLOMINE HCL 10 MG/ML IM SOLN
20.0000 mg | Freq: Once | INTRAMUSCULAR | Status: AC
  Administered 2023-02-16: 20 mg via INTRAMUSCULAR
  Filled 2023-02-16: qty 2

## 2023-02-16 MED ORDER — ONDANSETRON HCL 4 MG/2ML IJ SOLN
4.0000 mg | Freq: Once | INTRAMUSCULAR | Status: DC
  Filled 2023-02-16: qty 2

## 2023-02-16 NOTE — Discharge Instructions (Signed)
It was a pleasure taking care of you today.  As discussed, all of your labs were reassuring today.  Please follow-up with your GI doctor for further evaluation of your abdominal pain.  Your heart rate was low today while in the ER.  Please call your cardiologist for further evaluation.  Your x-ray of your right shoulder did not show any broken bones.  I suspect you bumped your arm within the past week causing bruising to your arm.  Please follow-up with PCP if symptoms do not improve over the next week.  Return to the ER for new or worsening symptoms.

## 2023-02-16 NOTE — ED Notes (Signed)
Pt endorses having a feeling of lightheadedness and dizziness.

## 2023-02-16 NOTE — ED Notes (Signed)
Ecchymosis to the right shoulder and upper arm, denies any trauma to the area. Ecchymosis began approximately 3-4 days ago and kept progressing, now has edema above the antecubital.

## 2023-02-16 NOTE — ED Provider Notes (Signed)
[place] EMERGENCY DEPARTMENT AT [facility] Provider Note   CSN: [id number] Arrival date & time: 02/15/23  1831     History  Chief Complaint  Patient presents with   Abdominal Pain    Rachel Black is a 72 y.o. female with a past medical history significant for hyperlipidemia, hypertension, history of CVA, history of diverticulitis who presents to the ED due to right upper extremity ecchymosis x 1 week.  Patient states symptoms started with pain in her shoulder and then developed ecchymosis a few days after.  Currently on Eliquis.  Denies any trauma to right upper extremity.  Admits to pain surrounding right shoulder.  Denies numbness/tingling.  No weakness.  Patient also admits to acute on chronic generalized abdominal pain.  Patient concerned she may have developed diverticulitis again.  Patient was admitted to the hospital on 5/15 to 5/19 due to abdominal pain. She has had recent diverticulitis that required IV antibiotics. Her abdominal pain was thought to be secondary to IBD/anxiety after numerous negative CT abdomens after treatment for diverticulitis. No fever or chills. Last BM yesterday. Admits to some nausea, but no vomiting. No diarrhea.  Previous cholecystectomy and abdominal hysterectomy. She has been taking Bentyl with no relief of abdominal pain. Notes her pain is about the same as it typically is.   History obtained from patient and past medical records. No interpreter used during encounter.       Home Medications Prior to Admission medications   Medication Sig Start Date End Date Taking? Authorizing Provider  ACCU-CHEK AVIVA PLUS test strip USE AS INSTRUCTED ONCE A DAY. DX CODE E11.9 07/20/22   Mayer Masker, PA-C  Accu-Chek Softclix Lancets lancets Use as instructed once a day.  DX Code: E11.9 03/24/22   Mayer Masker, PA-C  acetaminophen (TYLENOL) 500 MG tablet Take 1,000 mg by mouth every 6 (six) hours as needed for mild pain or moderate pain.     [provider]  allopurinol (ZYLOPRIM) 300 MG tablet TAKE 1 TABLET BY MOUTH TWICE A DAY 08/27/22   Carlean Jews, NP  apixaban (ELIQUIS) 5 MG TABS tablet Take 1 tablet (5 mg total) by mouth 2 (two) times daily. 02/09/23   Saralyn Pilar A, PA  CALCIUM PO Take 2 tablets by mouth daily.    [provider]  dicyclomine (BENTYL) 20 MG tablet Take 1 tablet (20 mg total) by mouth 3 (three) times daily with meals as needed for up to 7 days for spasms. 02/09/23 02/16/23  Saralyn Pilar A, PA  Emollient (UDDERLY SMOOTH) CREA Apply 1 application topically See admin instructions. Apply topically to skin folds as needed for rash/irritation    [provider]  famotidine (PEPCID) 10 MG tablet Take 10 mg by mouth daily as needed for heartburn or indigestion.    [provider]  fenofibrate 54 MG tablet Take 1 tablet (54 mg total) by mouth daily. 01/26/23   Drema Dallas, MD  ferrous sulfate 325 (65 FE) MG tablet TAKE 1 TABLET BY MOUTH TWICE A DAY Patient taking differently: Take 325 mg by mouth daily. 08/03/22   Zehr, Princella Pellegrini, PA-C  gabapentin (NEURONTIN) 300 MG capsule Take 1 capsule (300 mg total) by mouth at bedtime. 12/21/22   Kathryne Hitch, MD  isosorbide mononitrate (IMDUR) 30 MG 24 hr tablet Take 30 mg by mouth daily. 11/30/22   [provider]  olmesartan (BENICAR) 5 MG tablet TAKE 1 TABLET (5 MG TOTAL) BY MOUTH DAILY. 02/05/23  Carlean Jews, NP  pantoprazole (PROTONIX) 40 MG tablet Take 40 mg by mouth daily. 12/26/22   [provider]  rosuvastatin (CRESTOR) 40 MG tablet TAKE 1 TABLET BY MOUTH EVERY DAY 01/04/23   Rollene Rotunda, MD      Allergies    Other and Penicillins    Review of Systems   Review of Systems  Constitutional:  Negative for chills and fever.  Respiratory:  Negative for shortness of breath.   Cardiovascular:  Positive for chest pain.  Gastrointestinal:  Positive for abdominal pain and nausea. Negative for  diarrhea and vomiting.  Musculoskeletal:  Positive for arthralgias. Negative for back pain.  Skin:  Positive for color change.    Physical Exam Updated Vital Signs BP (!) 140/113   Pulse (!) 55   Temp 98.2 F (36.8 C)   Resp 13   Wt 84.8 kg   SpO2 99%   BMI 30.18 kg/m  Physical Exam Vitals and nursing note reviewed.  Constitutional:      General: She is not in acute distress.    Appearance: She is not ill-appearing.  HENT:     Head: Normocephalic.  Eyes:     Pupils: Pupils are equal, round, and reactive to light.  Cardiovascular:     Rate and Rhythm: Normal rate and regular rhythm.     Pulses: Normal pulses.     Heart sounds: Normal heart sounds. No murmur heard.    No friction rub. No gallop.  Pulmonary:     Effort: Pulmonary effort is normal.     Breath sounds: Normal breath sounds.  Abdominal:     General: Abdomen is flat. There is no distension.     Palpations: Abdomen is soft.     Tenderness: There is abdominal tenderness. There is no guarding or rebound.     Comments: Mild diffuse abdominal tenderness, most significant in RUQ.   Musculoskeletal:        General: Normal range of motion.     Cervical back: Neck supple.     Comments: Tenderness palpation throughout right shoulder.  Radial pulse intact.  Soft compartments.  Skin:    General: Skin is warm and dry.     Comments: Ecchymosis to right upper extremity from shoulder to mid humerus.  Neurological:     General: No focal deficit present.     Mental Status: She is alert.  Psychiatric:        Mood and Affect: Mood normal.        Behavior: Behavior normal.     ED Results / Procedures / Treatments   Labs (all labs ordered are listed, but only abnormal results are displayed) Labs Reviewed  COMPREHENSIVE METABOLIC PANEL - Abnormal; Notable for the following components:      Result Value   Glucose, Bld 108 (*)    Total Protein 5.8 (*)    All other components within normal limits  LIPASE, BLOOD  CBC   URINALYSIS, ROUTINE W REFLEX MICROSCOPIC  TROPONIN I (HIGH SENSITIVITY)  TROPONIN I (HIGH SENSITIVITY)    EKG EKG Interpretation  Date/Time:  Tuesday February 16 2023 08:10:43 EDT Ventricular Rate:  51 PR Interval:  193 QRS Duration: 123 QT Interval:  456 QTC Calculation: 420 R Axis:   -79 Text Interpretation: Sinus rhythm Multiple ventricular premature complexes Nonspecific IVCD with LAD Since last tracing rate slower Confirmed by Jacalyn Lefevre [id number]) on 02/16/2023 8:42:08 AM  Radiology DG Shoulder Right  Result Date: 02/16/2023 CLINICAL DATA:  Shoulder  pain, bruising EXAM: RIGHT SHOULDER - 2+ VIEW COMPARISON:  10/03/2022 FINDINGS: Bones are osteopenic. Elevation of the humeral head in relation the glenoid compatible with chronic rotator cuff tear. Degenerative changes also noted at the [facility] joint without separation. No acute osseous finding or fracture by plain radiography. Included right chest unremarkable. IMPRESSION: Osteopenia and degenerative changes as above. No acute finding by plain radiography. Electronically Signed   By: Judie Petit.  Shick M.D.   On: 02/16/2023 09:11    Procedures Procedures    Medications Ordered in ED Medications  dicyclomine (BENTYL) injection 20 mg (20 mg Intramuscular Given 02/16/23 0937)  ondansetron (ZOFRAN-ODT) disintegrating tablet 4 mg (4 mg Oral Given 02/16/23 1610)    ED Course/ Medical Decision Making/ A&P Clinical Course as of 02/16/23 0957  Tue Feb 16, 2023  0825 Informed by RN that patient had a episode of bradycardia at 39.  Patient symptomatic with dizziness. [CA]    Clinical Course User Index [CA] Mannie Stabile, PA-C                             Medical Decision Making Amount and/or Complexity of Data Reviewed Independent Historian: spouse External Data Reviewed: notes. Labs: ordered. Decision-making details documented in ED Course. Radiology: ordered and independent interpretation performed. Decision-making details documented in ED  Course. ECG/medicine tests: ordered and independent interpretation performed. Decision-making details documented in ED Course.  Risk Prescription drug management.   This patient presents to the ED for concern of abdominal pain,bruising, this involves an extensive number of treatment options, and is a complaint that carries with it a high risk of complications and morbidity.  The differential diagnosis includes IBS, diverticulitis, pancreatitis, bony fracture, compartment syndrome, etc  72 year old female presents to the ED due to to complaints of ecchymosis to right upper extremity and generalized abdominal pain.  Has a history of chronic abdominal pain with numerous admissions.  Recent diverticulitis treated with IV antibiotics.  Has had 2 negative CT scans since treatment for diverticulitis. Last admission abdominal pain thought to be secondary to IBS/anxiety. Patient denies worsening of abdominal pain.  She also admits to ecchymosis to right upper extremity x 1 week associated with shoulder pain.  No injury.  On Eliquis.  Unfortunately, patient waited over 14 hours prior to my initial evaluation due to long wait times.  Stable vitals.  Patient in no acute distress.  Ecchymosis to right upper extremity with soft compartments.  Radial pulse intact.  Tenderness throughout right shoulder with decreased range of motion.  X-ray ordered to rule out bony fracture.  Abdomen soft, nondistended with diffuse tenderness most significant in right upper quadrant. History of cholecystectomy.  Low suspicion for acute cholecystitis.  Tenderness very mild.  Bentyl and Zofran given.  Abdominal labs. Low suspicion for acute abdomen given abdominal exam. Will hold off on CT abdomen at this time.  CBC unremarkable.  No leukocytosis.  CMP significant for hyperglycemia 108.  No anion gap.  Normal renal function.  Lipase normal.  Doubt pancreatitis.  UA negative for signs of infection.  Troponin x 2 normal.  EKG demonstrates  sinus bradycardia.  No signs of acute ischemia.  Low suspicion for atypical ACS.  X-ray personally reviewed and interpreted which is negative for any bony fractures.  Suspect patient bumped arm causing ecchymosis to right upper extremity given patient is on Eliquis.  No evidence of compartment syndrome on exam.    Dr. Particia Nearing reassessed patient at  bedside and had a shared decision making in regards to CT abdomen vs. Symptomatic treatment given she has had 4 CT scans within the past month. Patient agreeable to hold off on CT abdomen at this time. Advised patient to follow-up with her GI doctor for further evaluation.  9:56 AM reassessed patient at bedside.  Patient resting comfortably in bed.  Discussed lab results.  Patient agreeable to hold off on CT abdomen.  While patient was in the ED she had one episode of symptomatic bradycardia where her heart rate got down to 39.  Patient admitted to some dizziness during episode which quickly resolved.  Patient maintained on cardiac monitor for the duration of her ED stay with no further episodes.  Patient able to ambulate to the bathroom with no difficulties.  Patient currently asymptomatic.  Advised patient to follow-up with her cardiologist for further evaluation.  Not currently on any beta-blockers.  Troponin x 2 normal.  Patient stable for discharge. Strict ED precautions discussed with patient. Patient states understanding and agrees to plan. Patient discharged home in no acute distress and stable vitals  Has PCP Lives with spouse       Final Clinical Impression(s) / ED Diagnoses Final diagnoses:  Generalized abdominal pain  Bruising    Rx / DC Orders ED Discharge Orders     None         Jesusita Oka 02/16/23 0957    Jacalyn Lefevre, MD 02/16/23 1031

## 2023-02-16 NOTE — ED Notes (Signed)
Pt ambulated to restroom with moderate one person assist, pt reports dizziness and lightheadedness while ambulating.

## 2023-02-16 NOTE — Telephone Encounter (Signed)
Attempted to call pt back on home phone, no answer. Tried to call pts husband on cell number and unable to reach him or leave a message.

## 2023-02-16 NOTE — Telephone Encounter (Signed)
[employer] calling with husband on a three way call wanting to get patient in for a sooner appt due to her being in and out of the hospital. Informed them that patient was offered a sooner appt a few days ago and denied it so we no longer have anything available. Requested to speak with a nurse regarding getting her in sooner. Please advise

## 2023-02-17 NOTE — Telephone Encounter (Signed)
Unable to reach pt. Will await further communication from pt.

## 2023-02-19 ENCOUNTER — Ambulatory Visit: Payer: BC Managed Care – PPO | Admitting: Nurse Practitioner

## 2023-02-19 ENCOUNTER — Emergency Department (HOSPITAL_COMMUNITY)
Admission: EM | Admit: 2023-02-19 | Discharge: 2023-02-20 | Disposition: A | Discharge status: Home / Self Care | Payer: BC Managed Care – PPO | Attending: Emergency Medicine | Admitting: Emergency Medicine

## 2023-02-19 ENCOUNTER — Other Ambulatory Visit: Payer: Self-pay

## 2023-02-19 DIAGNOSIS — R197 Diarrhea, unspecified: Secondary | ICD-10-CM | POA: Insufficient documentation

## 2023-02-19 DIAGNOSIS — Z6830 Body mass index (BMI) 30.0-30.9, adult: Secondary | ICD-10-CM | POA: Insufficient documentation

## 2023-02-19 DIAGNOSIS — I252 Old myocardial infarction: Secondary | ICD-10-CM | POA: Diagnosis not present

## 2023-02-19 DIAGNOSIS — Z9049 Acquired absence of other specified parts of digestive tract: Secondary | ICD-10-CM | POA: Insufficient documentation

## 2023-02-19 DIAGNOSIS — E119 Type 2 diabetes mellitus without complications: Secondary | ICD-10-CM | POA: Diagnosis not present

## 2023-02-19 DIAGNOSIS — Z86711 Personal history of pulmonary embolism: Secondary | ICD-10-CM | POA: Diagnosis not present

## 2023-02-19 DIAGNOSIS — I2510 Atherosclerotic heart disease of native coronary artery without angina pectoris: Secondary | ICD-10-CM | POA: Insufficient documentation

## 2023-02-19 DIAGNOSIS — R1084 Generalized abdominal pain: Secondary | ICD-10-CM

## 2023-02-19 DIAGNOSIS — R112 Nausea with vomiting, unspecified: Secondary | ICD-10-CM | POA: Diagnosis not present

## 2023-02-19 DIAGNOSIS — Z7901 Long term (current) use of anticoagulants: Secondary | ICD-10-CM | POA: Insufficient documentation

## 2023-02-19 DIAGNOSIS — E785 Hyperlipidemia, unspecified: Secondary | ICD-10-CM | POA: Insufficient documentation

## 2023-02-19 DIAGNOSIS — Z79899 Other long term (current) drug therapy: Secondary | ICD-10-CM | POA: Diagnosis not present

## 2023-02-19 DIAGNOSIS — Z8673 Personal history of transient ischemic attack (TIA), and cerebral infarction without residual deficits: Secondary | ICD-10-CM | POA: Insufficient documentation

## 2023-02-19 DIAGNOSIS — I10 Essential (primary) hypertension: Secondary | ICD-10-CM | POA: Diagnosis not present

## 2023-02-19 DIAGNOSIS — R109 Unspecified abdominal pain: Secondary | ICD-10-CM | POA: Diagnosis present

## 2023-02-19 LAB — COMPREHENSIVE METABOLIC PANEL
ALT: 15 U/L (ref 0–44)
AST: 19 U/L (ref 15–41)
Albumin: 3.6 g/dL (ref 3.5–5.0)
Alkaline Phosphatase: 41 U/L (ref 38–126)
Anion gap: 9 (ref 5–15)
BUN: 17 mg/dL (ref 8–23)
CO2: 23 mmol/L (ref 22–32)
Calcium: 9.4 mg/dL (ref 8.9–10.3)
Chloride: 108 mmol/L (ref 98–111)
Creatinine, Ser: 0.91 mg/dL (ref 0.44–1.00)
GFR, Estimated: >60 mL/min (ref >60)
Glucose, Bld: 112 mg/dL — ABNORMAL HIGH (ref 70–99)
Potassium: 3.6 mmol/L (ref 3.5–5.1)
Sodium: 140 mmol/L (ref 135–145)
Total Bilirubin: 0.5 mg/dL (ref 0.3–1.2)
Total Protein: 5.9 g/dL — ABNORMAL LOW (ref 6.5–8.1)

## 2023-02-19 LAB — CBC
HCT: 35.5 % — ABNORMAL LOW (ref 36.0–46.0)
Hemoglobin: 12 g/dL (ref 12.0–15.0)
MCH: 33.1 pg (ref 26.0–34.0)
MCHC: 33.8 g/dL (ref 30.0–36.0)
MCV: 97.8 fL (ref 80.0–100.0)
Platelets: 157 10*3/uL (ref 150–400)
RBC: 3.63 MIL/uL — ABNORMAL LOW (ref 3.87–5.11)
RDW: 13 % (ref 11.5–15.5)
WBC: 6.4 10*3/uL (ref 4.0–10.5)
nRBC: 0 % (ref 0.0–0.2)

## 2023-02-19 LAB — LIPASE, BLOOD: Lipase: 47 U/L (ref 11–51)

## 2023-02-19 MED ORDER — FENTANYL CITRATE PF 50 MCG/ML IJ SOSY
50.0000 ug | PREFILLED_SYRINGE | Freq: Once | INTRAMUSCULAR | Status: AC
  Administered 2023-02-19: 50 ug via INTRAVENOUS
  Filled 2023-02-19: qty 1

## 2023-02-19 NOTE — ED Provider Notes (Signed)
[place] EMERGENCY DEPARTMENT AT [facility] Provider Note   CSN: [id number] Arrival date & time: 02/19/23  1634     History {Add pertinent medical, surgical, social history, OB history to HPI:1} Chief Complaint  Patient presents with   Abdominal Pain    Rachel Black is a 72 y.o. female.  The history is provided by the patient, the spouse and medical records.  Abdominal Pain Rachel Black is a 72 y.o. female who presents to the Emergency Department complaining of abdominal pain.  One year.  Constant. Nausea.  No fever, dysuria, sob, vomiting.  Has occasional nausea.  Recently had diarrhea, now gone.       Home Medications Prior to Admission medications   Medication Sig Start Date End Date Taking? Authorizing Provider  ACCU-CHEK AVIVA PLUS test strip USE AS INSTRUCTED ONCE A DAY. DX CODE E11.9 07/20/22   Mayer Masker, PA-C  Accu-Chek Softclix Lancets lancets Use as instructed once a day.  DX Code: E11.9 03/24/22   Mayer Masker, PA-C  acetaminophen (TYLENOL) 500 MG tablet Take 1,000 mg by mouth every 6 (six) hours as needed for mild pain or moderate pain.    [provider]  allopurinol (ZYLOPRIM) 300 MG tablet TAKE 1 TABLET BY MOUTH TWICE A DAY 08/27/22   Carlean Jews, NP  apixaban (ELIQUIS) 5 MG TABS tablet Take 1 tablet (5 mg total) by mouth 2 (two) times daily. 02/09/23   Saralyn Pilar A, PA  CALCIUM PO Take 2 tablets by mouth daily.    [provider]  dicyclomine (BENTYL) 20 MG tablet Take 1 tablet (20 mg total) by mouth 3 (three) times daily with meals as needed for up to 7 days for spasms. 02/09/23 02/16/23  Saralyn Pilar A, PA  Emollient (UDDERLY SMOOTH) CREA Apply 1 application topically See admin instructions. Apply topically to skin folds as needed for rash/irritation    [provider]  famotidine (PEPCID) 10 MG tablet Take 10 mg by mouth daily as needed for heartburn or indigestion.    [provider]   fenofibrate 54 MG tablet Take 1 tablet (54 mg total) by mouth daily. 01/26/23   Drema Dallas, MD  ferrous sulfate 325 (65 FE) MG tablet TAKE 1 TABLET BY MOUTH TWICE A DAY Patient taking differently: Take 325 mg by mouth daily. 08/03/22   Zehr, Princella Pellegrini, PA-C  gabapentin (NEURONTIN) 300 MG capsule Take 1 capsule (300 mg total) by mouth at bedtime. 12/21/22   Kathryne Hitch, MD  isosorbide mononitrate (IMDUR) 30 MG 24 hr tablet Take 30 mg by mouth daily. 11/30/22   [provider]  olmesartan (BENICAR) 5 MG tablet TAKE 1 TABLET (5 MG TOTAL) BY MOUTH DAILY. 02/05/23   Carlean Jews, NP  pantoprazole (PROTONIX) 40 MG tablet Take 40 mg by mouth daily. 12/26/22   [provider]  rosuvastatin (CRESTOR) 40 MG tablet TAKE 1 TABLET BY MOUTH EVERY DAY 01/04/23   Rollene Rotunda, MD      Allergies    Other and Penicillins    Review of Systems   Review of Systems  Gastrointestinal:  Positive for abdominal pain.  All other systems reviewed and are negative.   Physical Exam Updated Vital Signs BP 132/80   Pulse 64   Temp 98 F (36.7 C) (Oral)   Resp 16   SpO2 100%  Physical Exam Vitals and nursing note reviewed.  Constitutional:      Appearance: She is well-developed.  HENT:     Head: Normocephalic and atraumatic.  Cardiovascular:     Rate and Rhythm: Normal rate and regular rhythm.     Heart sounds: No murmur heard. Pulmonary:     Effort: Pulmonary effort is normal. No respiratory distress.     Breath sounds: Normal breath sounds.  Abdominal:     Palpations: Abdomen is soft.     Tenderness: There is no guarding or rebound.     Comments: Mild generalized abdominal tenderness  Musculoskeletal:        General: No tenderness.  Skin:    General: Skin is warm and dry.  Neurological:     Mental Status: She is alert and oriented to person, place, and time.  Psychiatric:        Behavior: Behavior normal.     ED Results / Procedures / Treatments    Labs (all labs ordered are listed, but only abnormal results are displayed) Labs Reviewed  COMPREHENSIVE METABOLIC PANEL - Abnormal; Notable for the following components:      Result Value   Glucose, Bld 112 (*)    Total Protein 5.9 (*)    All other components within normal limits  CBC - Abnormal; Notable for the following components:   RBC 3.63 (*)    HCT 35.5 (*)    All other components within normal limits  LIPASE, BLOOD  URINALYSIS, ROUTINE W REFLEX MICROSCOPIC    EKG EKG Interpretation  Date/Time:  Friday February 19 2023 16:57:27 EDT Ventricular Rate:  86 PR Interval:  170 QRS Duration: 96 QT Interval:  362 QTC Calculation: 433 R Axis:   -75 Text Interpretation: Sinus rhythm with Premature supraventricular complexes Left anterior fascicular block Cannot rule out Anterior infarct , age undetermined Abnormal ECG Confirmed by Tilden Fossa [phone]) on 02/19/2023 11:12:16 PM  Radiology No results found.  Procedures Procedures  {Document cardiac monitor, telemetry assessment procedure when appropriate:1}  Medications Ordered in ED Medications - No data to display  ED Course/ Medical Decision Making/ A&P   {   Click here for ABCD2, HEART and other calculatorsREFRESH Note before signing :1}                          Medical Decision Making Amount and/or Complexity of Data Reviewed Labs: ordered.   ***  {Document critical care time when appropriate:1} {Document review of labs and clinical decision tools ie heart score, Chads2Vasc2 etc:1}  {Document your independent review of radiology images, and any outside records:1} {Document your discussion with family members, caretakers, and with consultants:1} {Document social determinants of health affecting pt's care:1} {Document your decision making why or why not admission, treatments were needed:1} Final Clinical Impression(s) / ED Diagnoses Final diagnoses:  None    Rx / DC Orders ED Discharge Orders     None

## 2023-02-19 NOTE — ED Triage Notes (Signed)
Patient reports continued generalized abdominal pain. Has associated nausea without vomiting. Recent hx diverticulitis.

## 2023-02-20 ENCOUNTER — Other Ambulatory Visit: Payer: Self-pay

## 2023-02-20 ENCOUNTER — Emergency Department (HOSPITAL_COMMUNITY)
Admission: EM | Admit: 2023-02-20 | Discharge: 2023-02-20 | Disposition: A | Discharge status: Home / Self Care | Payer: BC Managed Care – PPO | Source: Home / Self Care | Attending: Emergency Medicine | Admitting: Emergency Medicine

## 2023-02-20 ENCOUNTER — Emergency Department (HOSPITAL_COMMUNITY): Payer: BC Managed Care – PPO

## 2023-02-20 ENCOUNTER — Encounter (HOSPITAL_COMMUNITY): Payer: Self-pay

## 2023-02-20 DIAGNOSIS — R197 Diarrhea, unspecified: Secondary | ICD-10-CM | POA: Insufficient documentation

## 2023-02-20 DIAGNOSIS — I2510 Atherosclerotic heart disease of native coronary artery without angina pectoris: Secondary | ICD-10-CM | POA: Insufficient documentation

## 2023-02-20 DIAGNOSIS — E119 Type 2 diabetes mellitus without complications: Secondary | ICD-10-CM | POA: Insufficient documentation

## 2023-02-20 DIAGNOSIS — I10 Essential (primary) hypertension: Secondary | ICD-10-CM | POA: Insufficient documentation

## 2023-02-20 DIAGNOSIS — R112 Nausea with vomiting, unspecified: Secondary | ICD-10-CM

## 2023-02-20 DIAGNOSIS — R1084 Generalized abdominal pain: Secondary | ICD-10-CM

## 2023-02-20 DIAGNOSIS — Z79899 Other long term (current) drug therapy: Secondary | ICD-10-CM | POA: Insufficient documentation

## 2023-02-20 LAB — CBC WITH DIFFERENTIAL/PLATELET
Abs Immature Granulocytes: 0.01 10*3/uL (ref 0.00–0.07)
Basophils Absolute: 0 10*3/uL (ref 0.0–0.1)
Basophils Relative: 0 %
Eosinophils Absolute: 0 10*3/uL (ref 0.0–0.5)
Eosinophils Relative: 0 %
HCT: 36.7 % (ref 36.0–46.0)
Hemoglobin: 12.2 g/dL (ref 12.0–15.0)
Immature Granulocytes: 0 %
Lymphocytes Relative: 23 %
Lymphs Abs: 1.6 10*3/uL (ref 0.7–4.0)
MCH: 31.9 pg (ref 26.0–34.0)
MCHC: 33.2 g/dL (ref 30.0–36.0)
MCV: 96.1 fL (ref 80.0–100.0)
Monocytes Absolute: 0.3 10*3/uL (ref 0.1–1.0)
Monocytes Relative: 4 %
Neutro Abs: 4.9 10*3/uL (ref 1.7–7.7)
Neutrophils Relative %: 73 %
Platelets: 154 10*3/uL (ref 150–400)
RBC: 3.82 MIL/uL — ABNORMAL LOW (ref 3.87–5.11)
RDW: 12.7 % (ref 11.5–15.5)
WBC: 6.7 10*3/uL (ref 4.0–10.5)
nRBC: 0 % (ref 0.0–0.2)

## 2023-02-20 LAB — BASIC METABOLIC PANEL
Anion gap: 13 (ref 5–15)
BUN: 14 mg/dL (ref 8–23)
CO2: 25 mmol/L (ref 22–32)
Calcium: 9.6 mg/dL (ref 8.9–10.3)
Chloride: 102 mmol/L (ref 98–111)
Creatinine, Ser: 0.98 mg/dL (ref 0.44–1.00)
GFR, Estimated: >60 mL/min (ref >60)
Glucose, Bld: 99 mg/dL (ref 70–99)
Potassium: 3.6 mmol/L (ref 3.5–5.1)
Sodium: 140 mmol/L (ref 135–145)

## 2023-02-20 LAB — LACTIC ACID, PLASMA: Lactic Acid, Venous: 0.9 mmol/L (ref 0.5–1.9)

## 2023-02-20 MED ORDER — DICYCLOMINE HCL 10 MG PO CAPS: 10.0000 mg | ORAL_CAPSULE | Freq: Three times a day (TID) | ORAL | 0 refills | Status: DC | PRN

## 2023-02-20 MED ORDER — LACTATED RINGERS IV BOLUS
1000.0000 mL | Freq: Once | INTRAVENOUS | Status: AC
  Administered 2023-02-20: 1000 mL via INTRAVENOUS

## 2023-02-20 MED ORDER — OXYCODONE-ACETAMINOPHEN 5-325 MG PO TABS
1.0000 | ORAL_TABLET | Freq: Once | ORAL | Status: AC
  Administered 2023-02-20: 1 via ORAL
  Filled 2023-02-20: qty 1

## 2023-02-20 MED ORDER — ONDANSETRON HCL 4 MG/2ML IJ SOLN
4.0000 mg | Freq: Once | INTRAMUSCULAR | Status: AC
  Administered 2023-02-20: 4 mg via INTRAVENOUS
  Filled 2023-02-20: qty 2

## 2023-02-20 MED ORDER — OXYCODONE HCL 5 MG PO TABS: 5.0000 mg | ORAL_TABLET | ORAL | 0 refills | Status: AC | PRN

## 2023-02-20 MED ORDER — ONDANSETRON HCL 4 MG PO TABS: 4.0000 mg | ORAL_TABLET | Freq: Four times a day (QID) | ORAL | 0 refills | Status: DC

## 2023-02-20 MED ORDER — MORPHINE SULFATE (PF) 4 MG/ML IV SOLN
4.0000 mg | Freq: Once | INTRAVENOUS | Status: AC
  Administered 2023-02-20: 4 mg via INTRAVENOUS
  Filled 2023-02-20: qty 1

## 2023-02-20 MED ORDER — IOHEXOL 350 MG/ML SOLN
100.0000 mL | Freq: Once | INTRAVENOUS | Status: AC | PRN
  Administered 2023-02-20: 100 mL via INTRAVENOUS

## 2023-02-20 MED ORDER — DICYCLOMINE HCL 10 MG PO CAPS
20.0000 mg | ORAL_CAPSULE | Freq: Once | ORAL | Status: AC
  Administered 2023-02-20: 20 mg via ORAL
  Filled 2023-02-20: qty 2

## 2023-02-20 NOTE — ED Notes (Signed)
PO challenge completed, pt able to tolerate fluids and food, no sign of nausea or vomiting.

## 2023-02-20 NOTE — ED Triage Notes (Signed)
Pt c/o generalized abd pain "for quite a while"; evaluated for same yesterday; endorses nausea, no vomiting; denies fevers; recent hx diverticulitis

## 2023-02-20 NOTE — ED Provider Notes (Signed)
[place] EMERGENCY DEPARTMENT AT [facility] Provider Note   CSN: [id number] Arrival date & time: 02/20/23  1506     History  Chief Complaint  Patient presents with   Abdominal Pain    Rachel Black is a 72 y.o. female with choledocholithiasis status post cholecystectomy, severe obesity, gout, diabetes mellitus, HLD, history of CAD with history of NSTEMI, IBS, HTN, history of DVT/PE, presents with abd pain.  Pain is constant and generalized in nature, feels "throbbing." She has associated nausea. No fever, dysuria, shortness of breath, vomiting. She did recently have diarrhea but this is now resolved. She is currently scheduled to follow-up with GI in late August. She takes occasional acetaminophen for her pain. Per chart review, patient was evaluated for abdominal pain 8 prior times just within the last 6 weeks. She was just evaluated last night with a CT angio abdomen and pelvis, which demonstrated no abnormality in the abdomen/pelvis without any evidence of mesenteric ischemia.  It did show extensive sigmoid diverticulosis without any evidence of diverticulitis.  Patient has had four other CTs after abdomen also within the last 6 weeks.  She has recently been treated for diverticulitis.  She had reassuring labs last night, felt improved after fentanyl, and was DC'd w/ bentyl 10 mg TID. The pain returned today around noon. She tried some of the bentyl today with acetaminophen which didn't help much.      Abdominal Pain      Home Medications Prior to Admission medications   Medication Sig Start Date End Date Taking? Authorizing Provider  ondansetron (ZOFRAN) 4 MG tablet Take 1 tablet (4 mg total) by mouth every 6 (six) hours. 02/20/23  Yes Loetta Rough, MD  oxyCODONE (ROXICODONE) 5 MG immediate release tablet Take 1 tablet (5 mg total) by mouth every 4 (four) hours as needed for up to 3 days for severe pain. 02/20/23 02/23/23 Yes Loetta Rough, MD  ACCU-CHEK AVIVA PLUS  test strip USE AS INSTRUCTED ONCE A DAY. DX CODE E11.9 07/20/22   Mayer Masker, PA-C  Accu-Chek Softclix Lancets lancets Use as instructed once a day.  DX Code: E11.9 03/24/22   Mayer Masker, PA-C  acetaminophen (TYLENOL) 500 MG tablet Take 1,000 mg by mouth every 6 (six) hours as needed for mild pain or moderate pain.    [provider]  allopurinol (ZYLOPRIM) 300 MG tablet TAKE 1 TABLET BY MOUTH TWICE A DAY 08/27/22   Carlean Jews, NP  apixaban (ELIQUIS) 5 MG TABS tablet Take 1 tablet (5 mg total) by mouth 2 (two) times daily. 02/09/23   Saralyn Pilar A, PA  CALCIUM PO Take 2 tablets by mouth daily.    [provider]  dicyclomine (BENTYL) 10 MG capsule Take 1 capsule (10 mg total) by mouth 3 (three) times daily as needed for spasms. 02/20/23   Tilden Fossa, MD  Emollient (UDDERLY SMOOTH) CREA Apply 1 application topically See admin instructions. Apply topically to skin folds as needed for rash/irritation    [provider]  famotidine (PEPCID) 10 MG tablet Take 10 mg by mouth daily as needed for heartburn or indigestion.    [provider]  fenofibrate 54 MG tablet Take 1 tablet (54 mg total) by mouth daily. 01/26/23   Drema Dallas, MD  ferrous sulfate 325 (65 FE) MG tablet TAKE 1 TABLET BY MOUTH TWICE A DAY Patient taking differently: Take 325 mg by mouth daily. 08/03/22   Zehr, Princella Pellegrini, PA-C  gabapentin (  NEURONTIN) 300 MG capsule Take 1 capsule (300 mg total) by mouth at bedtime. 12/21/22   Kathryne Hitch, MD  isosorbide mononitrate (IMDUR) 30 MG 24 hr tablet Take 30 mg by mouth daily. 11/30/22   [provider]  olmesartan (BENICAR) 5 MG tablet TAKE 1 TABLET (5 MG TOTAL) BY MOUTH DAILY. 02/05/23   Carlean Jews, NP  pantoprazole (PROTONIX) 40 MG tablet Take 40 mg by mouth daily. 12/26/22   [provider]  rosuvastatin (CRESTOR) 40 MG tablet TAKE 1 TABLET BY MOUTH EVERY DAY 01/04/23   Rollene Rotunda, MD       Allergies    Other and Penicillins    Review of Systems   Review of Systems  Gastrointestinal:  Positive for abdominal pain.   Review of systems Negative for f/c.  A 10 point review of systems was performed and is negative unless otherwise reported in HPI.  Physical Exam Updated Vital Signs BP (!) 139/101   Pulse 66   Temp 98.2 F (36.8 C)   Resp 18   Ht 5\' 6"  (1.676 m)   Wt 87 kg   SpO2 100%   BMI 30.96 kg/m  Physical Exam General: Normal appearing female, lying in bed.  HEENT: Sclera anicteric, dry mucous membranes, trachea midline.  Cardiology: RRR, no murmurs/rubs/gallops.  Resp: Normal respiratory rate and effort. CTAB, no wheezes, rhonchi, crackles.  Abd: Diffusely mildly TTP. Soft, non-distended. No rebound tenderness or guarding.  GU: Deferred. MSK: No peripheral edema or signs of trauma.  Skin: warm, dry. Back: No CVA tenderness Neuro: A&Ox4, CNs II-XII grossly intact. MAEs. Sensation grossly intact.  Psych: Normal mood and affect.   ED Results / Procedures / Treatments   Labs (all labs ordered are listed, but only abnormal results are displayed) Labs Reviewed  CBC WITH DIFFERENTIAL/PLATELET - Abnormal; Notable for the following components:      Result Value   RBC 3.82 (*)    All other components within normal limits  BASIC METABOLIC PANEL    EKG EKG Interpretation  Date/Time:  Saturday February 20 2023 17:47:26 EDT Ventricular Rate:  57 PR Interval:  182 QRS Duration: 104 QT Interval:  434 QTC Calculation: 422 R Axis:   -68 Text Interpretation: Sinus bradycardia with Premature supraventricular complexes Left anterior fascicular block Confirmed by Vivi Barrack [phone]) on 02/20/2023 6:29:01 PM  Radiology CT Angio Abd/Pel W and/or Wo Contrast  Result Date: 02/20/2023 CLINICAL DATA:  Generalized abdominal pain. Query mesenteric ischemia EXAM: CTA ABDOMEN AND PELVIS WITHOUT AND WITH CONTRAST TECHNIQUE: Multidetector CT imaging of the abdomen and pelvis  was performed using the standard protocol during bolus administration of intravenous contrast. Multiplanar reconstructed images and MIPs were obtained and reviewed to evaluate the vascular anatomy. RADIATION DOSE REDUCTION: This exam was performed according to the departmental dose-optimization program which includes automated exposure control, adjustment of the mA and/or kV according to patient size and/or use of iterative reconstruction technique. CONTRAST:  OMNIPAQUE IOHEXOL 350 MG/ML SOLN COMPARISON:  CT abdomen and pelvis 02/02/2023 FINDINGS: VASCULAR Aorta: Normal caliber aorta without aneurysm, dissection, vasculitis or significant stenosis. Celiac: Patent without evidence of aneurysm, dissection, vasculitis or significant stenosis. SMA: Patent without evidence of aneurysm, dissection, vasculitis or significant stenosis. Renals: Both renal arteries are patent without evidence of aneurysm, dissection, vasculitis, fibromuscular dysplasia or significant stenosis. IMA: Patent without evidence of aneurysm, dissection, vasculitis or significant stenosis. Inflow: Patent without evidence of aneurysm, dissection, vasculitis or significant stenosis. Proximal Outflow: Bilateral common femoral and  visualized portions of the superficial and profunda femoral arteries are patent without evidence of aneurysm, dissection, vasculitis or significant stenosis. Veins: Patent portal vein and IVC. Review of the MIP images confirms the above findings. NON-VASCULAR Lower chest: No acute abnormality. Hepatobiliary: Cholecystectomy. Pneumobilia. Liver is unremarkable. Pancreas: Unremarkable. Spleen: Unremarkable. Adrenals/Urinary Tract: Stable adrenal glands. No urinary calculi or hydronephrosis. Unremarkable bladder. Stomach/Bowel: Normal caliber large and small bowel. No evidence of mesenteric ischemia. Extensive sigmoid diverticulosis without evidence of diverticulitis. Stomach is within normal limits. Small hiatal hernia.  Lymphatic: No lymphadenopathy. Reproductive: Hysterectomy.  No adnexal mass. Other: No free intraperitoneal fluid or air. Musculoskeletal: No acute fracture. IMPRESSION: 1. No acute abnormality in the abdomen or pelvis or evidence of mesenteric ischemia. 2. Extensive sigmoid diverticulosis without evidence of diverticulitis Electronically Signed   By: Minerva Fester M.D.   On: 02/20/2023 01:21    Procedures Procedures    Medications Ordered in ED Medications  dicyclomine (BENTYL) capsule 20 mg (20 mg Oral Given 02/20/23 1637)  morphine (PF) 4 MG/ML injection 4 mg (4 mg Intravenous Given 02/20/23 1804)  ondansetron (ZOFRAN) injection 4 mg (4 mg Intravenous Given 02/20/23 1804)  lactated ringers bolus 1,000 mL (0 mLs Intravenous Stopped 02/20/23 1901)  oxyCODONE-acetaminophen (PERCOCET/ROXICET) 5-325 MG per tablet 1 tablet (1 tablet Oral Given 02/20/23 1917)    ED Course/ Medical Decision Making/ A&P                          Medical Decision Making Amount and/or Complexity of Data Reviewed Labs: ordered. Decision-making details documented in ED Course.  Risk Prescription drug management.    This patient presents to the ED for concern of abdominal pain chronic, this involves an extensive number of treatment options, and is a complaint that carries with it a high risk of complications and morbidity.  Overall patient is HDS and well-appearing at this time.   MDM:    For DDX for abdominal pain includes but is not limited to:  Abdominal exam without peritoneal signs. No evidence of acute abdomen at this time.  As patient was just scanned last night with a CTA abdomen pelvis I do not believe she needs to be rescanned, and her abdomen does not have any signs of surgical abdomen at this time.    Low suspicion for acute hepatobiliary disease (including acute cholecystitis or cholangitis), acute pancreatitis (neg lipase yesterday), PUD (including gastric perforation), acute infectious processes  (pneumonia, hepatitis, pyelonephritis), acute appendicitis, vascular catastrophe, bowel obstruction, viscus perforation, or diverticulitis.   Electrolyte derangements, renal injury, or dehydration. Consider possible gastroparesis given h/o DM. Also considered chronic mesenteric ischemia but CTA last night was without e/o this.   Will get basic labs and give pain control/IVF and reeval patient.   Clinical Course as of 02/20/23 2056  Sat Feb 20, 2023  1828 WBC: 6.7 No leukocytosis  [HN]  1851 Basic metabolic panel wnl [HN]  1938 I reevaluated patient. She feels mildly improved and has tolerated PO. She is vitally stable and overall well-appearing. She states she would prefer to stay in the hospital to be cared for and receive IV fluids.  I explained to the patient that her labs not indicate significant dehydration and she is able to tolerate p.o. and is eating and drinking well, she does not need further IV fluid resuscitation at this time.  I also explained that gratefully she does not have any emergent cause of her abdominal pain at this time  that we have found on our evaluation and that she is stable for discharge and outpatient follow-up.  I will prescribe patient some pain control for home and have instructed to her to continue taking the Tylenol and Bentyl. Patient is already on cancellation list for GI appt and I encouraged them to follow up as soon as possible. Will also prescribe zofran PRN for nausea/vomiting. DC w/ discharge instructions/return precautions. All questions answered to patient's satisfaction.   [HN]    Clinical Course User Index [HN] Loetta Rough, MD    Labs: I Ordered, and personally interpreted labs.  The pertinent results include:  those listed above  Additional history obtained from chart review, husband at bedside.    Reevaluation: After the interventions noted above, I reevaluated the patient and found that they have :improved  Social Determinants of  Health: Patient lives independently   Disposition:  DC w/ discharge instructions/return precautions. All questions answered to patient's satisfaction.    Co morbidities that complicate the patient evaluation  Past Medical History:  Diagnosis Date   Arthritis    PAIN AND OA BOTH KNEES AND SHOULDERS AND ELBOWS AND WRIST   Blood transfusion without reported diagnosis 2018   after cholecystectomy   Broken foot, right, closed, initial encounter 09/15/2021   no surgery needed, fell at home and went to ED   Diabetes mellitus    ORAL MEDICATION   Diverticulitis    GERD (gastroesophageal reflux disease)    Gout    NO RECENT FLARE UPS   H/O: rheumatic fever    AS A CHILD - NO KNOWN HEART MURMUR OR HEART PROBLEMS   Heart attack (HCC)    Hyperlipidemia    Hypertension    PFO (patent foramen ovale)    Stroke (HCC) 03/2017     Medicines Meds ordered this encounter  Medications   dicyclomine (BENTYL) capsule 20 mg   morphine (PF) 4 MG/ML injection 4 mg   ondansetron (ZOFRAN) injection 4 mg   lactated ringers bolus 1,000 mL   oxyCODONE-acetaminophen (PERCOCET/ROXICET) 5-325 MG per tablet 1 tablet   oxyCODONE (ROXICODONE) 5 MG immediate release tablet    Sig: Take 1 tablet (5 mg total) by mouth every 4 (four) hours as needed for up to 3 days for severe pain.    Dispense:  12 tablet    Refill:  0   ondansetron (ZOFRAN) 4 MG tablet    Sig: Take 1 tablet (4 mg total) by mouth every 6 (six) hours.    Dispense:  12 tablet    Refill:  0    I have reviewed the patients home medicines and have made adjustments as needed  Problem List / ED Course: Problem List Items Addressed This Visit       Other   Generalized abdominal pain - Primary   Other Visit Diagnoses     Nausea vomiting and diarrhea                       This note was created using dictation software, which may contain spelling or grammatical errors.    Loetta Rough, MD 02/20/23 [phone]

## 2023-02-20 NOTE — ED Notes (Signed)
Wheeled patient to the bathroom patient did well patient is now back in bed with family at bedside

## 2023-02-20 NOTE — Discharge Instructions (Addendum)
Thank you for coming to [facility] Emergency Department. You were seen for abdominal pain. We did an exam, labs, and imaging, and these showed no acute findings.  Please follow up with your primary care provider within 1 week.  Please also follow up with your gastroenterologist at their earliest available appointment.  You can alternate taking Tylenol and ibuprofen as needed for pain. You can take 650mg  tylenol (acetaminophen) every 4-6 hours, and 600 mg ibuprofen 3 times a day. Please also utilize the oxycodone 5 mg every 4-6 hours for severe breakthrough pain. We have also prescribed zofran to use for nausea/vomiting every 6-8 hours as needed.  Do not hesitate to return to the ED or call 911 if you experience: -Worsening symptoms -Nausea/vomiting so severe that you cannot eat/drink anything -Lightheadedness, passing out -Fevers/chills -Anything else that concerns you

## 2023-02-22 ENCOUNTER — Encounter (HOSPITAL_COMMUNITY): Payer: Self-pay

## 2023-02-22 ENCOUNTER — Emergency Department (HOSPITAL_COMMUNITY)
Admission: EM | Admit: 2023-02-22 | Discharge: 2023-02-23 | Disposition: A | Discharge status: Home / Self Care | Payer: BC Managed Care – PPO | Attending: Emergency Medicine | Admitting: Emergency Medicine

## 2023-02-22 DIAGNOSIS — E119 Type 2 diabetes mellitus without complications: Secondary | ICD-10-CM | POA: Insufficient documentation

## 2023-02-22 DIAGNOSIS — Z79899 Other long term (current) drug therapy: Secondary | ICD-10-CM | POA: Insufficient documentation

## 2023-02-22 DIAGNOSIS — Z7901 Long term (current) use of anticoagulants: Secondary | ICD-10-CM | POA: Diagnosis not present

## 2023-02-22 DIAGNOSIS — I2510 Atherosclerotic heart disease of native coronary artery without angina pectoris: Secondary | ICD-10-CM | POA: Insufficient documentation

## 2023-02-22 DIAGNOSIS — R1084 Generalized abdominal pain: Secondary | ICD-10-CM

## 2023-02-22 DIAGNOSIS — I10 Essential (primary) hypertension: Secondary | ICD-10-CM | POA: Diagnosis not present

## 2023-02-22 LAB — COMPREHENSIVE METABOLIC PANEL
ALT: 15 U/L (ref 0–44)
AST: 18 U/L (ref 15–41)
Albumin: 3.6 g/dL (ref 3.5–5.0)
Alkaline Phosphatase: 40 U/L (ref 38–126)
Anion gap: 11 (ref 5–15)
BUN: 16 mg/dL (ref 8–23)
CO2: 24 mmol/L (ref 22–32)
Calcium: 9.6 mg/dL (ref 8.9–10.3)
Chloride: 106 mmol/L (ref 98–111)
Creatinine, Ser: 1.16 mg/dL — ABNORMAL HIGH (ref 0.44–1.00)
GFR, Estimated: 51 mL/min — ABNORMAL LOW (ref >60)
Glucose, Bld: 99 mg/dL (ref 70–99)
Potassium: 3.9 mmol/L (ref 3.5–5.1)
Sodium: 141 mmol/L (ref 135–145)
Total Bilirubin: 0.6 mg/dL (ref 0.3–1.2)
Total Protein: 5.8 g/dL — ABNORMAL LOW (ref 6.5–8.1)

## 2023-02-22 LAB — CBC
HCT: 36.5 % (ref 36.0–46.0)
Hemoglobin: 12.1 g/dL (ref 12.0–15.0)
MCH: 32.8 pg (ref 26.0–34.0)
MCHC: 33.2 g/dL (ref 30.0–36.0)
MCV: 98.9 fL (ref 80.0–100.0)
Platelets: 143 10*3/uL — ABNORMAL LOW (ref 150–400)
RBC: 3.69 MIL/uL — ABNORMAL LOW (ref 3.87–5.11)
RDW: 12.8 % (ref 11.5–15.5)
WBC: 7.6 10*3/uL (ref 4.0–10.5)
nRBC: 0 % (ref 0.0–0.2)

## 2023-02-22 LAB — LIPASE, BLOOD: Lipase: 33 U/L (ref 11–51)

## 2023-02-22 MED ORDER — FAMOTIDINE IN NACL 20-0.9 MG/50ML-% IV SOLN
20.0000 mg | Freq: Once | INTRAVENOUS | Status: AC
  Administered 2023-02-23: 20 mg via INTRAVENOUS
  Filled 2023-02-22: qty 50

## 2023-02-22 MED ORDER — ALUM & MAG HYDROXIDE-SIMETH 200-200-20 MG/5ML PO SUSP
30.0000 mL | Freq: Once | ORAL | Status: AC
  Administered 2023-02-23: 30 mL via ORAL
  Filled 2023-02-22: qty 30

## 2023-02-22 MED ORDER — LIDOCAINE VISCOUS HCL 2 % MT SOLN
15.0000 mL | Freq: Once | OROMUCOSAL | Status: AC
  Administered 2023-02-23: 15 mL via ORAL
  Filled 2023-02-22: qty 15

## 2023-02-22 MED ORDER — METOCLOPRAMIDE HCL 5 MG/ML IJ SOLN
10.0000 mg | Freq: Once | INTRAMUSCULAR | Status: AC
  Administered 2023-02-23: 10 mg via INTRAVENOUS
  Filled 2023-02-22: qty 2

## 2023-02-22 MED ORDER — DICYCLOMINE HCL 10 MG PO CAPS
10.0000 mg | ORAL_CAPSULE | Freq: Once | ORAL | Status: AC
  Administered 2023-02-23: 10 mg via ORAL
  Filled 2023-02-22: qty 1

## 2023-02-22 MED ORDER — LACTATED RINGERS IV BOLUS
500.0000 mL | Freq: Once | INTRAVENOUS | Status: AC
  Administered 2023-02-23: 500 mL via INTRAVENOUS

## 2023-02-22 NOTE — ED Triage Notes (Signed)
Pt c/o abd pain and generalized body pain. Denies N/V/D. Pt seen 2 days and 3 days ago for same.

## 2023-02-22 NOTE — ED Provider Notes (Signed)
[place] EMERGENCY DEPARTMENT AT [facility] Provider Note   CSN: [id number] Arrival date & time: 02/22/23  1646     History {Add pertinent medical, surgical, social history, OB history to HPI:1} Chief Complaint  Patient presents with   Abdominal Pain    Rachel Black is a 72 y.o. female.   Abdominal Pain Associated symptoms: nausea   Patient presents for abdominal pain.  Medical history includes DM, arthritis, CAD, GERD, IBS, anxiety, HTN, HLD.  She has had multiple recent ED visits for generalized abdominal pain.  Most recently, she was seen 2 days ago.  She underwent recent CTA of abdomen and pelvis which did not show any acute findings.  Patient reports that she has had similar abdominal pain today as she has chronically.  She describes it as generalized.  She does feel that it slightly worsened today.  She did have associated nausea but did not vomit.  She did have a normal bowel movement today.  She has been able to eat and drink.  She does not feel that pain is worsened postprandially.  Today, she took a Bentyl tablet at around noon.  She took a Zofran tablet for her nausea.     Home Medications Prior to Admission medications   Medication Sig Start Date End Date Taking? Authorizing Provider  ACCU-CHEK AVIVA PLUS test strip USE AS INSTRUCTED ONCE A DAY. DX CODE E11.9 07/20/22   Mayer Masker, PA-C  Accu-Chek Softclix Lancets lancets Use as instructed once a day.  DX Code: E11.9 03/24/22   Mayer Masker, PA-C  acetaminophen (TYLENOL) 500 MG tablet Take 1,000 mg by mouth every 6 (six) hours as needed for mild pain or moderate pain.    [provider]  allopurinol (ZYLOPRIM) 300 MG tablet TAKE 1 TABLET BY MOUTH TWICE A DAY 08/27/22   Carlean Jews, NP  apixaban (ELIQUIS) 5 MG TABS tablet Take 1 tablet (5 mg total) by mouth 2 (two) times daily. 02/09/23   Saralyn Pilar A, PA  CALCIUM PO Take 2 tablets by mouth daily.    [provider]   dicyclomine (BENTYL) 10 MG capsule Take 1 capsule (10 mg total) by mouth 3 (three) times daily as needed for spasms. 02/20/23   Tilden Fossa, MD  Emollient (UDDERLY SMOOTH) CREA Apply 1 application topically See admin instructions. Apply topically to skin folds as needed for rash/irritation    [provider]  famotidine (PEPCID) 10 MG tablet Take 10 mg by mouth daily as needed for heartburn or indigestion.    [provider]  fenofibrate 54 MG tablet Take 1 tablet (54 mg total) by mouth daily. 01/26/23   Drema Dallas, MD  ferrous sulfate 325 (65 FE) MG tablet TAKE 1 TABLET BY MOUTH TWICE A DAY Patient taking differently: Take 325 mg by mouth daily. 08/03/22   Zehr, Princella Pellegrini, PA-C  gabapentin (NEURONTIN) 300 MG capsule Take 1 capsule (300 mg total) by mouth at bedtime. 12/21/22   Kathryne Hitch, MD  isosorbide mononitrate (IMDUR) 30 MG 24 hr tablet Take 30 mg by mouth daily. 11/30/22   [provider]  olmesartan (BENICAR) 5 MG tablet TAKE 1 TABLET (5 MG TOTAL) BY MOUTH DAILY. 02/05/23   Carlean Jews, NP  ondansetron (ZOFRAN) 4 MG tablet Take 1 tablet (4 mg total) by mouth every 6 (six) hours. 02/20/23   Loetta Rough, MD  oxyCODONE (ROXICODONE) 5 MG immediate release tablet Take 1 tablet (5 mg total) by  mouth every 4 (four) hours as needed for up to 3 days for severe pain. 02/20/23 02/23/23  Loetta Rough, MD  pantoprazole (PROTONIX) 40 MG tablet Take 40 mg by mouth daily. 12/26/22   [provider]  rosuvastatin (CRESTOR) 40 MG tablet TAKE 1 TABLET BY MOUTH EVERY DAY 01/04/23   Rollene Rotunda, MD      Allergies    Other and Penicillins    Review of Systems   Review of Systems  Gastrointestinal:  Positive for abdominal pain and nausea.  All other systems reviewed and are negative.   Physical Exam Updated Vital Signs BP 133/87   Pulse 82   Temp 98.9 F (37.2 C)   Resp 16   Ht 5\' 6"  (1.676 m)   Wt 86.6 kg   SpO2 97%   BMI 30.83 kg/m   Physical Exam Vitals and nursing note reviewed.  Constitutional:      General: She is not in acute distress.    Appearance: She is well-developed. She is not ill-appearing, toxic-appearing or diaphoretic.  HENT:     Head: Normocephalic and atraumatic.     Mouth/Throat:     Mouth: Mucous membranes are moist.  Eyes:     General: No scleral icterus.    Conjunctiva/sclera: Conjunctivae normal.  Cardiovascular:     Rate and Rhythm: Normal rate and regular rhythm.     Heart sounds: No murmur heard. Pulmonary:     Effort: Pulmonary effort is normal. No respiratory distress.     Breath sounds: Normal breath sounds. No wheezing or rales.  Abdominal:     Palpations: Abdomen is soft.     Tenderness: There is no abdominal tenderness.  Musculoskeletal:        General: No swelling.     Cervical back: Neck supple.  Skin:    General: Skin is warm and dry.     Capillary Refill: Capillary refill takes less than 2 seconds.     Coloration: Skin is not cyanotic or jaundiced.  Neurological:     General: No focal deficit present.     Mental Status: She is alert and oriented to person, place, and time.  Psychiatric:        Mood and Affect: Mood normal. Affect is flat.        Speech: Speech normal.        Behavior: Behavior normal. Behavior is cooperative.        Thought Content: Thought content normal.     ED Results / Procedures / Treatments   Labs (all labs ordered are listed, but only abnormal results are displayed) Labs Reviewed  COMPREHENSIVE METABOLIC PANEL - Abnormal; Notable for the following components:      Result Value   Creatinine, Ser 1.16 (*)    Total Protein 5.8 (*)    GFR, Estimated 51 (*)    All other components within normal limits  CBC - Abnormal; Notable for the following components:   RBC 3.69 (*)    Platelets 143 (*)    All other components within normal limits  LIPASE, BLOOD  URINALYSIS, ROUTINE W REFLEX MICROSCOPIC    EKG None  Radiology No results  found.  Procedures Procedures  {Document cardiac monitor, telemetry assessment procedure when appropriate:1}  Medications Ordered in ED Medications - No data to display  ED Course/ Medical Decision Making/ A&P   {   Click here for ABCD2, HEART and other calculatorsREFRESH Note before signing :1}  Medical Decision Making Amount and/or Complexity of Data Reviewed Labs: ordered.  Risk OTC drugs. Prescription drug management.   This patient presents to the ED for concern of ***, this involves an extensive number of treatment options, and is a complaint that carries with it a high risk of complications and morbidity.  The differential diagnosis includes ***   Co morbidities that complicate the patient evaluation  ***   Additional history obtained:  Additional history obtained from *** External records from outside source obtained and reviewed including ***   Lab Tests:  I Ordered, and personally interpreted labs.  The pertinent results include:  ***   Imaging Studies ordered:  I ordered imaging studies including ***  I independently visualized and interpreted imaging which showed *** I agree with the radiologist interpretation   Cardiac Monitoring: / EKG:  The patient was maintained on a cardiac monitor.  I personally viewed and interpreted the cardiac monitored which showed an underlying rhythm of: ***   Consultations Obtained:  I requested consultation with the ***,  and discussed lab and imaging findings as well as pertinent plan - they recommend: ***   Problem List / ED Course / Critical interventions / Medication management  Patient with history of chronic abdominal pain with multiple recent ED visits, presenting for similar symptoms today.  Per chart review, she recently underwent CTA of abdomen pelvis 2 days ago.  Results do not show any acute findings.  On exam today, abdomen is soft.  She does not have any tenderness present.   Patient was given multimodal pain control for treatment of her abdominal pain.  Laboratory workup is unremarkable.  On reassessment,***. I ordered medication including ***  for ***  Reevaluation of the patient after these medicines showed that the patient {resolved/improved/worsened:23923::"improved"} I have reviewed the patients home medicines and have made adjustments as needed   Social Determinants of Health:  ***   Test / Admission - Considered:  ***   {Document critical care time when appropriate:1} {Document review of labs and clinical decision tools ie heart score, Chads2Vasc2 etc:1}  {Document your independent review of radiology images, and any outside records:1} {Document your discussion with family members, caretakers, and with consultants:1} {Document social determinants of health affecting pt's care:1} {Document your decision making why or why not admission, treatments were needed:1} Final Clinical Impression(s) / ED Diagnoses Final diagnoses:  None    Rx / DC Orders ED Discharge Orders     None

## 2023-02-23 LAB — URINALYSIS, ROUTINE W REFLEX MICROSCOPIC
Bacteria, UA: NONE SEEN
Bilirubin Urine: NEGATIVE
Glucose, UA: NEGATIVE mg/dL
Hgb urine dipstick: NEGATIVE
Ketones, ur: NEGATIVE mg/dL
Nitrite: NEGATIVE
Protein, ur: NEGATIVE mg/dL
Specific Gravity, Urine: 1.017 (ref 1.005–1.030)
pH: 5 (ref 5.0–8.0)

## 2023-02-23 MED ORDER — GABAPENTIN 300 MG PO CAPS: 300.0000 mg | ORAL_CAPSULE | Freq: Every day | ORAL | 1 refills | Status: DC

## 2023-02-23 NOTE — Discharge Instructions (Signed)
Continue taking Bentyl and Tylenol as needed for abdominal pain.  Continue taking Zofran as needed for nausea.  A refill prescription for your gabapentin was sent to your pharmacy.  Return to the emergency department for any new or worsening symptoms of concern.

## 2023-02-24 ENCOUNTER — Ambulatory Visit: Payer: BC Managed Care – PPO | Admitting: Orthopaedic Surgery

## 2023-02-26 ENCOUNTER — Emergency Department (HOSPITAL_COMMUNITY)
Admission: EM | Admit: 2023-02-26 | Discharge: 2023-02-26 | Disposition: A | Discharge status: Home / Self Care | Payer: BC Managed Care – PPO | Attending: Emergency Medicine | Admitting: Emergency Medicine

## 2023-02-26 ENCOUNTER — Emergency Department (HOSPITAL_COMMUNITY): Payer: BC Managed Care – PPO

## 2023-02-26 ENCOUNTER — Other Ambulatory Visit: Payer: Self-pay

## 2023-02-26 ENCOUNTER — Encounter (HOSPITAL_COMMUNITY): Payer: Self-pay

## 2023-02-26 DIAGNOSIS — E119 Type 2 diabetes mellitus without complications: Secondary | ICD-10-CM | POA: Insufficient documentation

## 2023-02-26 DIAGNOSIS — R1031 Right lower quadrant pain: Secondary | ICD-10-CM | POA: Diagnosis present

## 2023-02-26 DIAGNOSIS — Z7901 Long term (current) use of anticoagulants: Secondary | ICD-10-CM | POA: Insufficient documentation

## 2023-02-26 DIAGNOSIS — I10 Essential (primary) hypertension: Secondary | ICD-10-CM | POA: Diagnosis not present

## 2023-02-26 DIAGNOSIS — I2510 Atherosclerotic heart disease of native coronary artery without angina pectoris: Secondary | ICD-10-CM | POA: Insufficient documentation

## 2023-02-26 DIAGNOSIS — K5732 Diverticulitis of large intestine without perforation or abscess without bleeding: Secondary | ICD-10-CM | POA: Diagnosis not present

## 2023-02-26 DIAGNOSIS — K5792 Diverticulitis of intestine, part unspecified, without perforation or abscess without bleeding: Secondary | ICD-10-CM

## 2023-02-26 DIAGNOSIS — Z79899 Other long term (current) drug therapy: Secondary | ICD-10-CM | POA: Insufficient documentation

## 2023-02-26 DIAGNOSIS — K5900 Constipation, unspecified: Secondary | ICD-10-CM | POA: Diagnosis not present

## 2023-02-26 LAB — COMPREHENSIVE METABOLIC PANEL
ALT: 14 U/L (ref 0–44)
AST: 16 U/L (ref 15–41)
Albumin: 3.5 g/dL (ref 3.5–5.0)
Alkaline Phosphatase: 42 U/L (ref 38–126)
Anion gap: 10 (ref 5–15)
BUN: 14 mg/dL (ref 8–23)
CO2: 21 mmol/L — ABNORMAL LOW (ref 22–32)
Calcium: 9.6 mg/dL (ref 8.9–10.3)
Chloride: 109 mmol/L (ref 98–111)
Creatinine, Ser: 1.08 mg/dL — ABNORMAL HIGH (ref 0.44–1.00)
GFR, Estimated: 55 mL/min — ABNORMAL LOW (ref >60)
Glucose, Bld: 98 mg/dL (ref 70–99)
Potassium: 4 mmol/L (ref 3.5–5.1)
Sodium: 140 mmol/L (ref 135–145)
Total Bilirubin: 0.4 mg/dL (ref 0.3–1.2)
Total Protein: 5.8 g/dL — ABNORMAL LOW (ref 6.5–8.1)

## 2023-02-26 LAB — CBC
HCT: 35.4 % — ABNORMAL LOW (ref 36.0–46.0)
Hemoglobin: 11.6 g/dL — ABNORMAL LOW (ref 12.0–15.0)
MCH: 31.7 pg (ref 26.0–34.0)
MCHC: 32.8 g/dL (ref 30.0–36.0)
MCV: 96.7 fL (ref 80.0–100.0)
Platelets: 159 10*3/uL (ref 150–400)
RBC: 3.66 MIL/uL — ABNORMAL LOW (ref 3.87–5.11)
RDW: 12.5 % (ref 11.5–15.5)
WBC: 5.2 10*3/uL (ref 4.0–10.5)
nRBC: 0 % (ref 0.0–0.2)

## 2023-02-26 LAB — LIPASE, BLOOD: Lipase: 44 U/L (ref 11–51)

## 2023-02-26 MED ORDER — MORPHINE SULFATE (PF) 4 MG/ML IV SOLN
4.0000 mg | Freq: Once | INTRAVENOUS | Status: AC
  Administered 2023-02-26: 4 mg via INTRAVENOUS
  Filled 2023-02-26: qty 1

## 2023-02-26 MED ORDER — AMOXICILLIN-POT CLAVULANATE 875-125 MG PO TABS
1.0000 | ORAL_TABLET | Freq: Once | ORAL | Status: AC
  Administered 2023-02-26: 1 via ORAL
  Filled 2023-02-26: qty 1

## 2023-02-26 MED ORDER — ONDANSETRON HCL 4 MG PO TABS: 4.0000 mg | ORAL_TABLET | Freq: Three times a day (TID) | ORAL | 0 refills | Status: AC | PRN

## 2023-02-26 MED ORDER — ONDANSETRON HCL 4 MG/2ML IJ SOLN
4.0000 mg | Freq: Once | INTRAMUSCULAR | Status: AC
  Administered 2023-02-26: 4 mg via INTRAVENOUS
  Filled 2023-02-26: qty 2

## 2023-02-26 MED ORDER — IOHEXOL 350 MG/ML SOLN
75.0000 mL | Freq: Once | INTRAVENOUS | Status: AC | PRN
  Administered 2023-02-26: 75 mL via INTRAVENOUS

## 2023-02-26 MED ORDER — AMOXICILLIN-POT CLAVULANATE 875-125 MG PO TABS: 1.0000 | ORAL_TABLET | Freq: Two times a day (BID) | ORAL | 0 refills | Status: AC

## 2023-02-26 NOTE — ED Provider Notes (Signed)
[facility] EMERGENCY DEPARTMENT AT [facility] Provider Note   CSN: [id number] Arrival date & time: 02/26/23  1742     History  Chief Complaint  Patient presents with   Abdominal Pain    Rachel Black is a 72 y.o. female with past medical history type 2 diabetes currently controlled by diet, CAD, hypertension, hyperlipidemia, diverticulitis requiring inpatient treatment who presents to the ED complaining of abdominal pain.  She states that she has intermittently had abdominal pain for several months that has been evaluated multiple times in the ED without a cause identified.  States that today though her pain is different and she began having severe localized pain in the right lower quadrant.  Previous abdominal surgeries include cholecystectomy and hysterectomy.  Associated nausea but no fever, vomiting, diarrhea, chest pain, shortness of breath, dysuria or hematuria.  States that she has not had a regular bowel movement in 3 days.  She has been tolerating p.o.     Home Medications Prior to Admission medications   Medication Sig Start Date End Date Taking? Authorizing Provider  amoxicillin-clavulanate (AUGMENTIN) 875-125 MG tablet Take 1 tablet by mouth 2 (two) times daily for 7 days. 02/27/23 03/06/23 Yes Maneh Sieben L, PA-C  ondansetron (ZOFRAN) 4 MG tablet Take 1 tablet (4 mg total) by mouth every 8 (eight) hours as needed for up to 3 days for nausea or vomiting. 02/26/23 03/01/23 Yes Khamil Lamica L, PA-C  ACCU-CHEK AVIVA PLUS test strip USE AS INSTRUCTED ONCE A DAY. DX CODE E11.9 07/20/22   Mayer Masker, PA-C  Accu-Chek Softclix Lancets lancets Use as instructed once a day.  DX Code: E11.9 03/24/22   Mayer Masker, PA-C  acetaminophen (TYLENOL) 500 MG tablet Take 1,000 mg by mouth every 6 (six) hours as needed for mild pain or moderate pain.    [provider]  allopurinol (ZYLOPRIM) 300 MG tablet TAKE 1 TABLET BY MOUTH TWICE A DAY 08/27/22   Carlean Jews, NP  apixaban (ELIQUIS) 5 MG TABS tablet Take 1 tablet (5 mg total) by mouth 2 (two) times daily. 02/09/23   Saralyn Pilar A, PA  CALCIUM PO Take 2 tablets by mouth daily.    [provider]  dicyclomine (BENTYL) 10 MG capsule Take 1 capsule (10 mg total) by mouth 3 (three) times daily as needed for spasms. 02/20/23   Tilden Fossa, MD  Emollient (UDDERLY SMOOTH) CREA Apply 1 application topically See admin instructions. Apply topically to skin folds as needed for rash/irritation    [provider]  famotidine (PEPCID) 10 MG tablet Take 10 mg by mouth daily as needed for heartburn or indigestion.    [provider]  fenofibrate 54 MG tablet Take 1 tablet (54 mg total) by mouth daily. 01/26/23   Drema Dallas, MD  ferrous sulfate 325 (65 FE) MG tablet TAKE 1 TABLET BY MOUTH TWICE A DAY Patient taking differently: Take 325 mg by mouth daily. 08/03/22   Zehr, Princella Pellegrini, PA-C  gabapentin (NEURONTIN) 300 MG capsule Take 1 capsule (300 mg total) by mouth at bedtime. 02/23/23   Gloris Manchester, MD  isosorbide mononitrate (IMDUR) 30 MG 24 hr tablet Take 30 mg by mouth daily. 11/30/22   [provider]  olmesartan (BENICAR) 5 MG tablet TAKE 1 TABLET (5 MG TOTAL) BY MOUTH DAILY. 02/05/23   Carlean Jews, NP  ondansetron (ZOFRAN) 4 MG tablet Take 1 tablet (4 mg total) by mouth every 6 (six) hours. 02/20/23  Loetta Rough, MD  pantoprazole (PROTONIX) 40 MG tablet Take 40 mg by mouth daily. 12/26/22   [provider]  rosuvastatin (CRESTOR) 40 MG tablet TAKE 1 TABLET BY MOUTH EVERY DAY 01/04/23   Rollene Rotunda, MD      Allergies    Other and Penicillins    Review of Systems   Review of Systems  All other systems reviewed and are negative.   Physical Exam Updated Vital Signs BP (!) 147/83 (BP Location: Right Arm)   Pulse (!) 57   Temp 99.7 F (37.6 C) (Oral)   Resp 20   SpO2 98%  Physical Exam Vitals and nursing note reviewed.  Constitutional:       General: She is not in acute distress.    Appearance: Normal appearance.  HENT:     Head: Normocephalic and atraumatic.     Mouth/Throat:     Mouth: Mucous membranes are moist.  Eyes:     Conjunctiva/sclera: Conjunctivae normal.  Cardiovascular:     Rate and Rhythm: Normal rate and regular rhythm.     Heart sounds: No murmur heard. Pulmonary:     Effort: Pulmonary effort is normal.     Breath sounds: Normal breath sounds.  Abdominal:     General: Abdomen is flat. There is no distension.     Palpations: Abdomen is soft. There is no shifting dullness, fluid wave, hepatomegaly, splenomegaly, mass or pulsatile mass.     Tenderness: There is generalized abdominal tenderness. There is no right CVA tenderness, left CVA tenderness, guarding or rebound. Negative signs include McBurney's sign.  Musculoskeletal:        General: Normal range of motion.     Cervical back: Neck supple.     Right lower leg: No edema.     Left lower leg: No edema.  Skin:    General: Skin is warm and dry.     Capillary Refill: Capillary refill takes less than 2 seconds.     Findings: No rash.  Neurological:     General: No focal deficit present.     Mental Status: She is alert and oriented to person, place, and time. Mental status is at baseline.  Psychiatric:        Mood and Affect: Mood normal.        Behavior: Behavior normal.     ED Results / Procedures / Treatments   Labs (all labs ordered are listed, but only abnormal results are displayed) Labs Reviewed  COMPREHENSIVE METABOLIC PANEL - Abnormal; Notable for the following components:      Result Value   CO2 21 (*)    Creatinine, Ser 1.08 (*)    Total Protein 5.8 (*)    GFR, Estimated 55 (*)    All other components within normal limits  CBC - Abnormal; Notable for the following components:   RBC 3.66 (*)    Hemoglobin 11.6 (*)    HCT 35.4 (*)    All other components within normal limits  LIPASE, BLOOD    EKG EKG  Interpretation  Date/Time:  Friday February 26 2023 17:50:58 EDT Ventricular Rate:  71 PR Interval:  158 QRS Duration: 94 QT Interval:  378 QTC Calculation: 410 R Axis:   -64 Text Interpretation: Sinus rhythm with Premature supraventricular complexes Left anterior fascicular block Abnormal ECG When compared with ECG of 20-Feb-2023 17:47, PREVIOUS ECG IS PRESENT when compared to prior, overall similar appearance,. No STEMI Confirmed by Theda Belfast ([id number]) on 02/26/2023 6:48:35 PM  Radiology CT ABDOMEN PELVIS W CONTRAST  Result Date: 02/26/2023 CLINICAL DATA:  Right lower quadrant abdominal pain. EXAM: CT ABDOMEN AND PELVIS WITH CONTRAST TECHNIQUE: Multidetector CT imaging of the abdomen and pelvis was performed using the standard protocol following bolus administration of intravenous contrast. RADIATION DOSE REDUCTION: This exam was performed according to the departmental dose-optimization program which includes automated exposure control, adjustment of the mA and/or kV according to patient size and/or use of iterative reconstruction technique. CONTRAST:  75mL OMNIPAQUE IOHEXOL 350 MG/ML SOLN COMPARISON:  CT abdomen pelvis dated 02/20/2023. FINDINGS: Lower chest: The visualized lung bases are clear. No intra-abdominal free air or free fluid. Hepatobiliary: The liver is unremarkable. There is biliary ductal dilatation and pneumobilia, post cholecystectomy. No retained calcified stone noted in the central CBD. Pancreas: Unremarkable. No pancreatic ductal dilatation or surrounding inflammatory changes. Spleen: Normal in size without focal abnormality. Adrenals/Urinary Tract: The adrenal glands are unremarkable. Small left renal interpolar cyst. There is no hydronephrosis on either side. There is symmetric enhancement and excretion of contrast by both kidneys. The visualized ureters and urinary bladder appear unremarkable. Stomach/Bowel: There is sigmoid diverticulosis. There is mild perisigmoid haziness,  increased since the prior CT and may represent mild acute diverticulitis. Clinical correlation is recommended. No diverticular abscess or perforation. There is no bowel obstruction. The appendix is unremarkable. Vascular/Lymphatic: The abdominal aorta and IVC are unremarkable. No portal venous gas. There is no adenopathy. Reproductive: Hysterectomy.  No adnexal masses. Other: None Musculoskeletal: Osteopenia with degenerative changes of the spine. No acute osseous pathology. Grade 1 L3-L4 anterolisthesis. IMPRESSION: 1. Sigmoid diverticulosis with possible mild acute diverticulitis. Clinical correlation is recommended. 2. No bowel obstruction. Normal appendix. 3. Biliary ductal dilatation and pneumobilia, post cholecystectomy. Electronically Signed   By: Elgie Collard M.D.   On: 02/26/2023 19:47    Procedures Procedures    Medications Ordered in ED Medications  amoxicillin-clavulanate (AUGMENTIN) 875-125 MG per tablet 1 tablet (has no administration in time range)  morphine (PF) 4 MG/ML injection 4 mg (4 mg Intravenous Given 02/26/23 1857)  ondansetron (ZOFRAN) injection 4 mg (4 mg Intravenous Given 02/26/23 1857)  iohexol (OMNIPAQUE) 350 MG/ML injection 75 mL (75 mLs Intravenous Contrast Given 02/26/23 1909)    ED Course/ Medical Decision Making/ A&P                             Medical Decision Making Amount and/or Complexity of Data Reviewed Labs: ordered. Decision-making details documented in ED Course. Radiology: ordered. Decision-making details documented in ED Course. ECG/medicine tests: ordered. Decision-making details documented in ED Course.  Risk Prescription drug management.   Medical Decision Making:   SALLEE UNRUH is a 72 y.o. female who presented to the ED today with abdominal pain detailed above.    Additional history discussed with patient's family/caregivers.  Patient's presentation is complicated by their history of advanced age, recurrent diverticulitis, DM, HTN,  HLD, chronic abdominal pain.  Complete initial physical exam performed, notably the patient  was in NAD. Generalized abdominal tenderness. Unable to localize pain. Abdomen soft, non-distended. No rebound, guarding, or peritoneal signs. No CVA tenderness.    Reviewed and confirmed nursing documentation for past medical history, family history, social history.    Initial Assessment:   With the patient's presentation of abdominal pain, differential diagnosis includes but is not limited to AAA, mesenteric ischemia, appendicitis, diverticulitis, DKA, gastritis, gastroenteritis, AMI, nephrolithiasis, pancreatitis, peritonitis, adrenal insufficiency, intestinal ischemia, constipation, UTI, SBO/LBO, splenic  rupture, biliary disease, IBD, IBS, PUD, hepatitis, STD, ovarian/testicular torsion, electrolyte disturbance, DKA, dehydration, acute kidney injury, renal failure, cholecystitis, cholelithiasis, choledocholithiasis, abdominal pain of  unknown etiology.   Initial Plan:  Screening labs including CBC and Metabolic panel to evaluate for infectious or metabolic etiology of disease.  Lipase to evaluate for pancreatitis Urinalysis with reflex culture ordered to evaluate for UTI or relevant urologic/nephrologic pathology.  Patient unable to provide urine sample during ED stay.  She does not have any associated dysuria, frequency, associated urinary symptoms.  Low suspicion for UTI.  Okay with foregoing this in favor of treating suspected diverticulitis found on CT scan. CT abd/pelvis to evaluate for intra-abdominal pathology EKG to evaluate for cardiac pathology Symptomatic management Objective evaluation as reviewed   Initial Study Results:   Laboratory  All laboratory results reviewed without evidence of clinically relevant pathology.   Exceptions include: Cr 1.08 similar to baseline, Hgb 11.6   EKG EKG was reviewed independently. ST segments without concerns for elevations.   EKG: unchanged from  previous tracings, normal sinus rhythm.   Radiology:  All images reviewed independently. Agree with radiology report at this time.   CT ABDOMEN PELVIS W CONTRAST  Result Date: 02/26/2023 CLINICAL DATA:  Right lower quadrant abdominal pain. EXAM: CT ABDOMEN AND PELVIS WITH CONTRAST TECHNIQUE: Multidetector CT imaging of the abdomen and pelvis was performed using the standard protocol following bolus administration of intravenous contrast. RADIATION DOSE REDUCTION: This exam was performed according to the departmental dose-optimization program which includes automated exposure control, adjustment of the mA and/or kV according to patient size and/or use of iterative reconstruction technique. CONTRAST:  75mL OMNIPAQUE IOHEXOL 350 MG/ML SOLN COMPARISON:  CT abdomen pelvis dated 02/20/2023. FINDINGS: Lower chest: The visualized lung bases are clear. No intra-abdominal free air or free fluid. Hepatobiliary: The liver is unremarkable. There is biliary ductal dilatation and pneumobilia, post cholecystectomy. No retained calcified stone noted in the central CBD. Pancreas: Unremarkable. No pancreatic ductal dilatation or surrounding inflammatory changes. Spleen: Normal in size without focal abnormality. Adrenals/Urinary Tract: The adrenal glands are unremarkable. Small left renal interpolar cyst. There is no hydronephrosis on either side. There is symmetric enhancement and excretion of contrast by both kidneys. The visualized ureters and urinary bladder appear unremarkable. Stomach/Bowel: There is sigmoid diverticulosis. There is mild perisigmoid haziness, increased since the prior CT and may represent mild acute diverticulitis. Clinical correlation is recommended. No diverticular abscess or perforation. There is no bowel obstruction. The appendix is unremarkable. Vascular/Lymphatic: The abdominal aorta and IVC are unremarkable. No portal venous gas. There is no adenopathy. Reproductive: Hysterectomy.  No adnexal masses.  Other: None Musculoskeletal: Osteopenia with degenerative changes of the spine. No acute osseous pathology. Grade 1 L3-L4 anterolisthesis. IMPRESSION: 1. Sigmoid diverticulosis with possible mild acute diverticulitis. Clinical correlation is recommended. 2. No bowel obstruction. Normal appendix. 3. Biliary ductal dilatation and pneumobilia, post cholecystectomy. Electronically Signed   By: Elgie Collard M.D.   On: 02/26/2023 19:47   CT Angio Abd/Pel W and/or Wo Contrast  Result Date: 02/20/2023 CLINICAL DATA:  Generalized abdominal pain. Query mesenteric ischemia EXAM: CTA ABDOMEN AND PELVIS WITHOUT AND WITH CONTRAST TECHNIQUE: Multidetector CT imaging of the abdomen and pelvis was performed using the standard protocol during bolus administration of intravenous contrast. Multiplanar reconstructed images and MIPs were obtained and reviewed to evaluate the vascular anatomy. RADIATION DOSE REDUCTION: This exam was performed according to the departmental dose-optimization program which includes automated exposure control, adjustment of the mA and/or kV according  to patient size and/or use of iterative reconstruction technique. CONTRAST:  OMNIPAQUE IOHEXOL 350 MG/ML SOLN COMPARISON:  CT abdomen and pelvis 02/02/2023 FINDINGS: VASCULAR Aorta: Normal caliber aorta without aneurysm, dissection, vasculitis or significant stenosis. Celiac: Patent without evidence of aneurysm, dissection, vasculitis or significant stenosis. SMA: Patent without evidence of aneurysm, dissection, vasculitis or significant stenosis. Renals: Both renal arteries are patent without evidence of aneurysm, dissection, vasculitis, fibromuscular dysplasia or significant stenosis. IMA: Patent without evidence of aneurysm, dissection, vasculitis or significant stenosis. Inflow: Patent without evidence of aneurysm, dissection, vasculitis or significant stenosis. Proximal Outflow: Bilateral common femoral and visualized portions of the superficial  and profunda femoral arteries are patent without evidence of aneurysm, dissection, vasculitis or significant stenosis. Veins: Patent portal vein and IVC. Review of the MIP images confirms the above findings. NON-VASCULAR Lower chest: No acute abnormality. Hepatobiliary: Cholecystectomy. Pneumobilia. Liver is unremarkable. Pancreas: Unremarkable. Spleen: Unremarkable. Adrenals/Urinary Tract: Stable adrenal glands. No urinary calculi or hydronephrosis. Unremarkable bladder. Stomach/Bowel: Normal caliber large and small bowel. No evidence of mesenteric ischemia. Extensive sigmoid diverticulosis without evidence of diverticulitis. Stomach is within normal limits. Small hiatal hernia. Lymphatic: No lymphadenopathy. Reproductive: Hysterectomy.  No adnexal mass. Other: No free intraperitoneal fluid or air. Musculoskeletal: No acute fracture. IMPRESSION: 1. No acute abnormality in the abdomen or pelvis or evidence of mesenteric ischemia. 2. Extensive sigmoid diverticulosis without evidence of diverticulitis Electronically Signed   By: Minerva Fester M.D.   On: 02/20/2023 01:21   DG Shoulder Right  Result Date: 02/16/2023 CLINICAL DATA:  Shoulder pain, bruising EXAM: RIGHT SHOULDER - 2+ VIEW COMPARISON:  10/03/2022 FINDINGS: Bones are osteopenic. Elevation of the humeral head in relation the glenoid compatible with chronic rotator cuff tear. Degenerative changes also noted at the [facility] joint without separation. No acute osseous finding or fracture by plain radiography. Included right chest unremarkable. IMPRESSION: Osteopenia and degenerative changes as above. No acute finding by plain radiography. Electronically Signed   By: Judie Petit.  Shick M.D.   On: 02/16/2023 09:11   CT ABDOMEN PELVIS W CONTRAST  Result Date: 02/02/2023 CLINICAL DATA:  Abdominal pain, acute, nonlocalized EXAM: CT ABDOMEN AND PELVIS WITH CONTRAST TECHNIQUE: Multidetector CT imaging of the abdomen and pelvis was performed using the standard protocol  following bolus administration of intravenous contrast. RADIATION DOSE REDUCTION: This exam was performed according to the departmental dose-optimization program which includes automated exposure control, adjustment of the mA and/or kV according to patient size and/or use of iterative reconstruction technique. CONTRAST:  75mL OMNIPAQUE IOHEXOL 350 MG/ML SOLN COMPARISON:  01/27/2023 FINDINGS: Lower chest: Visualized lung bases are clear. Small hiatal hernia. Cardiac size within normal limits. Hepatobiliary: Status post cholecystectomy. Mild pneumobilia again noted within a nondilated intrahepatic biliary tree in keeping with prior sphincterotomy. The liver is otherwise unremarkable. Pancreas: Unremarkable Spleen: Unremarkable Adrenals/Urinary Tract: The adrenal glands are unremarkable. The kidneys are normal in size and position. Simple cortical cyst noted within the interpolar region of the left kidney for which no follow-up imaging is recommended. The kidneys are otherwise unremarkable. Bladder unremarkable. Stomach/Bowel: Moderate sigmoid diverticulosis. The stomach, small bowel, and large bowel are otherwise unremarkable. Appendix normal. No free intraperitoneal gas or fluid. Vascular/Lymphatic: Aortic atherosclerosis. No enlarged abdominal or pelvic lymph nodes. Reproductive: Status post hysterectomy. No adnexal masses. Other: No abdominal wall hernia or abnormality. No abdominopelvic ascites. Musculoskeletal: Degenerative changes seen within the lumbar spine with grade 1 anterolisthesis L3-4. No acute bone abnormality. No lytic or blastic bone lesion. IMPRESSION: 1. No acute intra-abdominal pathology  identified. No definite radiographic explanation for the patient's reported symptoms. 2. Small hiatal hernia. 3. Moderate sigmoid diverticulosis without superimposed acute inflammatory change. 4. Aortic atherosclerosis. Aortic Atherosclerosis (ICD10-I70.0). Electronically Signed   By: Helyn Numbers M.D.   On:  02/02/2023 03:19   CT ABDOMEN PELVIS WO CONTRAST  Result Date: 01/27/2023 CLINICAL DATA:  Acute abdominal pain EXAM: CT ABDOMEN AND PELVIS WITHOUT CONTRAST TECHNIQUE: Multidetector CT imaging of the abdomen and pelvis was performed following the standard protocol without IV contrast. RADIATION DOSE REDUCTION: This exam was performed according to the departmental dose-optimization program which includes automated exposure control, adjustment of the mA and/or kV according to patient size and/or use of iterative reconstruction technique. COMPARISON:  CT abdomen and pelvis 01/22/2023 FINDINGS: Lower chest: No acute abnormality. Hepatobiliary: Gallbladder is surgically absent. Pneumobilia again noted. No focal liver lesions are seen. Pancreas: Unremarkable. No pancreatic ductal dilatation or surrounding inflammatory changes. Spleen: Normal in size without focal abnormality. Adrenals/Urinary Tract: Rounded hypodensity measuring 14 mm in the left kidney is unchanged. Otherwise, the kidneys, bladder and adrenal glands are within normal limits. Stomach/Bowel: There is a small hiatal hernia. Stomach is within normal limits. Appendix is not seen. No evidence of bowel wall thickening, distention, or inflammatory changes. Vascular/Lymphatic: No significant vascular findings are present. No enlarged abdominal or pelvic lymph nodes. Reproductive: Status post hysterectomy. No adnexal masses. Other: There is a tiny fat containing midline ventral hernia superior to the umbilicus, unchanged. Anterior abdominal wall mesh is present. There is no ascites. Musculoskeletal: Degenerative changes affect the spine. IMPRESSION: 1. No acute localizing process in the abdomen or pelvis. 2. Small hiatal hernia. 3. Cholecystectomy with pneumobilia. Electronically Signed   By: Darliss Cheney M.D.   On: 01/27/2023 21:51      Final Assessment and Plan:   72 year old female presents to ED c/o abdominal pain. Somewhat chronic history of abdominal  pain. Notes worse in RLQ today. Generalized abdominal tenderness on exam. Also states no bowel movement in 3 days. Pt nontoxic appearing. Abdomen soft, no peritoneal signs. No associated vomiting, diarrhea, urinary symptoms. Workup initiated as above for further assessment.  Patient has a history of recurrent diverticulitis with difficulty treating this in the outpatient setting.  She has not recently been on antibiotics for this.  She is currently awaiting a follow-up appointment with GI with first available scheduled in August 2024.  Reviewed previous CT scans as patient has had multiple this month and most recent have been negative for acute pathology.  Low suspicion for emergent process.  Suspect more so that this is an exacerbation of patient's chronic abdominal pain.  She does appear comfortable on initial and multiple repeat assessments.  She appears well-hydrated.  Afebrile here, vital signs reassuring.  CT scan shows possible early acute diverticulitis.  Discussed with attending physician who cosigned this note and with patient's extensive history we will go ahead and treat with Augmentin.  Discussed this with patient and husband at bedside.  Patient expresses significant frustration with her chronic symptoms and lack of availability with specialist care in the outpatient setting.  Acknowledged patient's frustrations.  Discussed with her that should her symptoms not be improving or worsening, she is able to return to the ED for reevaluation and/or further care that may include inpatient admission should she continue to have a diverticulitis flare that does not respond to p.o. antibiotics.  Patient has been expressed understanding of this plan.  Strict ED return precautions given, all questions answered, and stable  for discharge   Clinical Impression:  1. Acute diverticulitis      Discharge           Final Clinical Impression(s) / ED Diagnoses Final diagnoses:  Acute diverticulitis    Rx  / DC Orders ED Discharge Orders          Ordered    amoxicillin-clavulanate (AUGMENTIN) 875-125 MG tablet  2 times daily        02/26/23 2126    ondansetron (ZOFRAN) 4 MG tablet  Every 8 hours PRN        02/26/23 2126              Richardson Dopp 02/26/23 2134    Tegeler, Canary Brim, MD 02/26/23 2202

## 2023-02-26 NOTE — Discharge Instructions (Signed)
Thank you for letting us take care of you today.  Your lab work looked good. Your CT scan did not show any major issues apart from a possible early mild diverticulitis. With your history, we will proceed with treating this. Please follow up with your PCP early next week for re-evaluation. Keep in close contact with GI office you have been referred to to see if an earlier appointment than the one you have in August becomes available.   I prescribed Augmentin as the antibiotic to treat the diverticulitis since you had good result with this last time. I also prescribed Zofran for nausea as needed.  For new or worsening symptoms, return to nearest ED for re-evaluation.

## 2023-02-26 NOTE — ED Triage Notes (Signed)
Pt came in via POV d/t abd pain for the past few months & a couple of days ago Rt side of her abd started hurting worse. Denies n/v, some abd bloating, some belching, dry mouth & reports states she has been having small hard BM's a few days apart recently. A/Ox4, rates pain 7/10 while in triage.

## 2023-02-28 ENCOUNTER — Emergency Department (HOSPITAL_COMMUNITY)
Admission: EM | Admit: 2023-02-28 | Discharge: 2023-02-28 | Disposition: A | Discharge status: Home / Self Care | Payer: BC Managed Care – PPO | Attending: Student | Admitting: Student

## 2023-02-28 ENCOUNTER — Other Ambulatory Visit: Payer: Self-pay

## 2023-02-28 ENCOUNTER — Emergency Department (HOSPITAL_COMMUNITY): Payer: BC Managed Care – PPO

## 2023-02-28 ENCOUNTER — Encounter (HOSPITAL_COMMUNITY): Payer: Self-pay

## 2023-02-28 DIAGNOSIS — K219 Gastro-esophageal reflux disease without esophagitis: Secondary | ICD-10-CM | POA: Insufficient documentation

## 2023-02-28 DIAGNOSIS — Z79899 Other long term (current) drug therapy: Secondary | ICD-10-CM | POA: Insufficient documentation

## 2023-02-28 DIAGNOSIS — Z96653 Presence of artificial knee joint, bilateral: Secondary | ICD-10-CM | POA: Diagnosis not present

## 2023-02-28 DIAGNOSIS — Z7901 Long term (current) use of anticoagulants: Secondary | ICD-10-CM | POA: Diagnosis not present

## 2023-02-28 DIAGNOSIS — E119 Type 2 diabetes mellitus without complications: Secondary | ICD-10-CM | POA: Diagnosis not present

## 2023-02-28 DIAGNOSIS — I10 Essential (primary) hypertension: Secondary | ICD-10-CM | POA: Diagnosis not present

## 2023-02-28 DIAGNOSIS — Z955 Presence of coronary angioplasty implant and graft: Secondary | ICD-10-CM | POA: Diagnosis not present

## 2023-02-28 DIAGNOSIS — R1032 Left lower quadrant pain: Secondary | ICD-10-CM | POA: Diagnosis not present

## 2023-02-28 DIAGNOSIS — K5900 Constipation, unspecified: Secondary | ICD-10-CM

## 2023-02-28 DIAGNOSIS — I2510 Atherosclerotic heart disease of native coronary artery without angina pectoris: Secondary | ICD-10-CM | POA: Diagnosis not present

## 2023-02-28 LAB — URINALYSIS, ROUTINE W REFLEX MICROSCOPIC
Bilirubin Urine: NEGATIVE
Glucose, UA: NEGATIVE mg/dL
Hgb urine dipstick: NEGATIVE
Ketones, ur: NEGATIVE mg/dL
Leukocytes,Ua: NEGATIVE
Nitrite: NEGATIVE
Protein, ur: NEGATIVE mg/dL
Specific Gravity, Urine: 1.011 (ref 1.005–1.030)
pH: 6 (ref 5.0–8.0)

## 2023-02-28 LAB — CBC
HCT: 36.8 % (ref 36.0–46.0)
Hemoglobin: 12.1 g/dL (ref 12.0–15.0)
MCH: 32.4 pg (ref 26.0–34.0)
MCHC: 32.9 g/dL (ref 30.0–36.0)
MCV: 98.7 fL (ref 80.0–100.0)
Platelets: 169 10*3/uL (ref 150–400)
RBC: 3.73 MIL/uL — ABNORMAL LOW (ref 3.87–5.11)
RDW: 12.7 % (ref 11.5–15.5)
WBC: 5 10*3/uL (ref 4.0–10.5)
nRBC: 0 % (ref 0.0–0.2)

## 2023-02-28 LAB — COMPREHENSIVE METABOLIC PANEL
ALT: 12 U/L (ref 0–44)
AST: 17 U/L (ref 15–41)
Albumin: 3.6 g/dL (ref 3.5–5.0)
Alkaline Phosphatase: 43 U/L (ref 38–126)
Anion gap: 8 (ref 5–15)
BUN: 15 mg/dL (ref 8–23)
CO2: 26 mmol/L (ref 22–32)
Calcium: 9.6 mg/dL (ref 8.9–10.3)
Chloride: 103 mmol/L (ref 98–111)
Creatinine, Ser: 1.18 mg/dL — ABNORMAL HIGH (ref 0.44–1.00)
GFR, Estimated: 50 mL/min — ABNORMAL LOW (ref >60)
Glucose, Bld: 112 mg/dL — ABNORMAL HIGH (ref 70–99)
Potassium: 3.9 mmol/L (ref 3.5–5.1)
Sodium: 137 mmol/L (ref 135–145)
Total Bilirubin: 0.7 mg/dL (ref 0.3–1.2)
Total Protein: 6.1 g/dL — ABNORMAL LOW (ref 6.5–8.1)

## 2023-02-28 LAB — LIPASE, BLOOD: Lipase: 35 U/L (ref 11–51)

## 2023-02-28 MED ORDER — GLYCERIN (LAXATIVE) 1 G RE SUPP
1.0000 | RECTAL | Status: DC | PRN
  Administered 2023-02-28: 1 g via RECTAL
  Filled 2023-02-28 (×2): qty 1

## 2023-02-28 MED ORDER — MORPHINE SULFATE (PF) 4 MG/ML IV SOLN
4.0000 mg | Freq: Once | INTRAVENOUS | Status: AC
  Administered 2023-02-28: 4 mg via INTRAVENOUS
  Filled 2023-02-28: qty 1

## 2023-02-28 MED ORDER — LACTATED RINGERS IV BOLUS
1000.0000 mL | Freq: Once | INTRAVENOUS | Status: AC
  Administered 2023-02-28: 1000 mL via INTRAVENOUS

## 2023-02-28 MED ORDER — IOHEXOL 350 MG/ML SOLN
75.0000 mL | Freq: Once | INTRAVENOUS | Status: AC | PRN
  Administered 2023-02-28: 75 mL via INTRAVENOUS

## 2023-02-28 MED ORDER — ONDANSETRON HCL 4 MG/2ML IJ SOLN
4.0000 mg | Freq: Once | INTRAMUSCULAR | Status: AC
  Administered 2023-02-28: 4 mg via INTRAVENOUS
  Filled 2023-02-28: qty 2

## 2023-02-28 MED ORDER — MAGNESIUM CITRATE PO SOLN
1.0000 | Freq: Once | ORAL | Status: AC
  Administered 2023-02-28: 1 via ORAL
  Filled 2023-02-28: qty 296

## 2023-02-28 MED ORDER — GOLYTELY 236 G PO SOLR: 4000.0000 mL | Freq: Once | ORAL | 0 refills | Status: AC

## 2023-02-28 NOTE — ED Notes (Signed)
Pt given turkey sandwich and water

## 2023-02-28 NOTE — ED Notes (Signed)
Soap suds enema given to pt. Pt received ~600cc of enema. Pt encouraged to try to hold enema in for 20 minutes, however pt states she could not hold enema in any longer. Pt assisted to [facility] to have BM.

## 2023-02-28 NOTE — ED Notes (Signed)
Pt attempted to collect urine sample but was unable to urinate

## 2023-02-28 NOTE — ED Triage Notes (Signed)
Pt came in via POV d/t abd pain the past few days (Hx of same) & reports not being able to have BM for 2 days. A/Ox4, rates pain 5/10.

## 2023-02-28 NOTE — ED Notes (Signed)
Pt had BM after enema with no stool. MD notified.

## 2023-03-01 NOTE — ED Provider Notes (Signed)
[place] EMERGENCY DEPARTMENT AT [facility] Provider Note  CSN: [id number] Arrival date & time: 02/28/23 1242  Chief Complaint(s) Abdominal Pain  HPI Rachel Black is a 72 y.o. female with PMH GERD, diverticulitis, CVA, PFO, CAD status post MI who presents emergency room for evaluation of left lower quadrant abdominal pain.  States that she has not had a bowel movement for the last 3 days.  She has been seen quite frequently in the emergency department for similar complaints including 5 times this month already.  She was recently diagnosed with diverticulitis on 02/26/2023 and has been taking her Augmentin at home.  She states that she feels that the pain is not improving in the left lower quadrant but denies nausea, vomiting, chest pain, shortness of breath or other systemic symptoms.   Past Medical History Past Medical History:  Diagnosis Date   Arthritis    PAIN AND OA BOTH KNEES AND SHOULDERS AND ELBOWS AND WRIST   Blood transfusion without reported diagnosis 2018   after cholecystectomy   Broken foot, right, closed, initial encounter 09/15/2021   no surgery needed, fell at home and went to ED   Diabetes mellitus    ORAL MEDICATION   Diverticulitis    GERD (gastroesophageal reflux disease)    Gout    NO RECENT FLARE UPS   H/O: rheumatic fever    AS A CHILD - NO KNOWN HEART MURMUR OR HEART PROBLEMS   Heart attack (HCC)    Hyperlipidemia    Hypertension    PFO (patent foramen ovale)    Stroke (HCC) 03/2017   Patient Active Problem List   Diagnosis Date Noted   Anxiety 01/31/2023   Diverticulitis 01/22/2023   Diarrhea 01/14/2023   Colitis 01/14/2023   Acute pulmonary embolism (HCC) 01/14/2023   H/O: CVA (cerebrovascular accident) 01/14/2023   CAD S/P percutaneous coronary angioplasty 01/14/2023   Atopic dermatitis 11/01/2022   Inflammatory polyarthropathy (HCC) 11/01/2022   Palpitations 10/25/2022   Left hip pain 10/04/2022   Epigastric pain 03/16/2022    Irritable bowel syndrome with constipation 03/16/2022   Influenza B 09/16/2021   Cryptogenic stroke (HCC) 01/13/2021   GERD without esophagitis 11/26/2020   Iron deficiency 11/26/2020   Generalized abdominal pain 11/26/2020   Family history of breast cancer in mother 11/27/2019   Cataract of both eyes 11/27/2019   Coronary artery disease involving native coronary artery of native heart without angina pectoris 02/18/2018   NSTEMI (non-ST elevated myocardial infarction) (HCC)    History of rheumatic fever 06/30/2017   PFO (patent foramen ovale)    Cerebrovascular accident (CVA) due to embolism of right middle cerebral artery (HCC) 03/14/2017   Dilation of biliary tract    Elevated LFTs    Choledocholithiasis with obstruction 12/07/2016   AKI (acute kidney injury) (HCC) 12/07/2016   Osteoarthritis of right knee 08/23/2015   Status post total left knee replacement 08/23/2015   Arthritis of knee, right 04/20/2014   Status post total right knee replacement 04/20/2014   Flushing reaction 03/10/2012   Diabetes mellitus (HCC) 02/17/2007   GOUT 02/17/2007   Severe obesity (BMI >= 40) (HCC) 02/17/2007   Mixed hyperlipidemia due to type 2 diabetes mellitus (HCC) 02/17/2007   Hypertension associated with diabetes (HCC) 02/17/2007   Home Medication(s) Prior to Admission medications   Medication Sig Start Date End Date Taking? Authorizing Provider  ACCU-CHEK AVIVA PLUS test strip USE AS INSTRUCTED ONCE A DAY. DX CODE E11.9 07/20/22   Mayer Masker, PA-C  Accu-Chek Softclix Lancets lancets Use as instructed once a day.  DX Code: E11.9 03/24/22   Mayer Masker, PA-C  acetaminophen (TYLENOL) 500 MG tablet Take 1,000 mg by mouth every 6 (six) hours as needed for mild pain or moderate pain.    [provider]  allopurinol (ZYLOPRIM) 300 MG tablet TAKE 1 TABLET BY MOUTH TWICE A DAY 08/27/22   Carlean Jews, NP  amoxicillin-clavulanate (AUGMENTIN) 875-125 MG tablet Take 1 tablet by  mouth 2 (two) times daily for 7 days. 02/27/23 03/06/23  Gowens, Dow Adolph L, PA-C  apixaban (ELIQUIS) 5 MG TABS tablet Take 1 tablet (5 mg total) by mouth 2 (two) times daily. 02/09/23   Saralyn Pilar A, PA  CALCIUM PO Take 2 tablets by mouth daily.    [provider]  dicyclomine (BENTYL) 10 MG capsule Take 1 capsule (10 mg total) by mouth 3 (three) times daily as needed for spasms. 02/20/23   Tilden Fossa, MD  Emollient (UDDERLY SMOOTH) CREA Apply 1 application topically See admin instructions. Apply topically to skin folds as needed for rash/irritation    [provider]  famotidine (PEPCID) 10 MG tablet Take 10 mg by mouth daily as needed for heartburn or indigestion.    [provider]  fenofibrate 54 MG tablet Take 1 tablet (54 mg total) by mouth daily. 01/26/23   Drema Dallas, MD  ferrous sulfate 325 (65 FE) MG tablet TAKE 1 TABLET BY MOUTH TWICE A DAY Patient taking differently: Take 325 mg by mouth daily. 08/03/22   Zehr, Princella Pellegrini, PA-C  gabapentin (NEURONTIN) 300 MG capsule Take 1 capsule (300 mg total) by mouth at bedtime. 02/23/23   Gloris Manchester, MD  isosorbide mononitrate (IMDUR) 30 MG 24 hr tablet Take 30 mg by mouth daily. 11/30/22   [provider]  olmesartan (BENICAR) 5 MG tablet TAKE 1 TABLET (5 MG TOTAL) BY MOUTH DAILY. 02/05/23   Carlean Jews, NP  ondansetron (ZOFRAN) 4 MG tablet Take 1 tablet (4 mg total) by mouth every 6 (six) hours. 02/20/23   Loetta Rough, MD  ondansetron (ZOFRAN) 4 MG tablet Take 1 tablet (4 mg total) by mouth every 8 (eight) hours as needed for up to 3 days for nausea or vomiting. 02/26/23 03/01/23  Gowens, Mariah L, PA-C  pantoprazole (PROTONIX) 40 MG tablet Take 40 mg by mouth daily. 12/26/22   [provider]  rosuvastatin (CRESTOR) 40 MG tablet TAKE 1 TABLET BY MOUTH EVERY DAY 01/04/23   Rollene Rotunda, MD                                                                                                                                     Past Surgical History Past Surgical History:  Procedure Laterality Date   ABDOMINAL HYSTERECTOMY     CESAREAN SECTION     CHOLECYSTECTOMY N/A 12/09/2016   Procedure: LAPAROSCOPIC CHOLECYSTECTOMY WITH INTRAOPERATIVE CHOLANGIOGRAM;  Surgeon: Almond Lint, MD;  Location: [facility] ORS;  Service: General;  Laterality: N/A;   COLONOSCOPY     COLONOSCOPY WITH PROPOFOL N/A 01/30/2013   Procedure: COLONOSCOPY WITH PROPOFOL;  Surgeon: Hilarie Fredrickson, MD;  Location: [facility] ENDOSCOPY;  Service: Endoscopy;  Laterality: N/A;   ERCP N/A 12/08/2016   Procedure: ENDOSCOPIC RETROGRADE CHOLANGIOPANCREATOGRAPHY (ERCP);  Surgeon: Meryl Dare, MD;  Location: [facility];  Service: Endoscopy;  Laterality: N/A;   ESOPHAGOGASTRODUODENOSCOPY (EGD) WITH PROPOFOL N/A 01/30/2013   Procedure: ESOPHAGOGASTRODUODENOSCOPY (EGD) WITH PROPOFOL;  Surgeon: Hilarie Fredrickson, MD;  Location: [facility] ENDOSCOPY;  Service: Endoscopy;  Laterality: N/A;   LEFT HEART CATH AND CORONARY ANGIOGRAPHY N/A 10/18/2017   Procedure: LEFT HEART CATH AND CORONARY ANGIOGRAPHY;  Surgeon: [place]az[place], Peter M, MD;  Location: [facility] INVASIVE CV LAB;  Service: Cardiovascular;  Laterality: N/A;   LOOP RECORDER INSERTION N/A 03/18/2017   Procedure: Loop Recorder Insertion;  Surgeon: Regan Lemming, MD;  Location: [facility] INVASIVE CV LAB;  Service: Cardiovascular;  Laterality: N/A;   PARTIAL HYSTERECTOMY     TEE WITHOUT CARDIOVERSION N/A 03/16/2017   Procedure: TRANSESOPHAGEAL ECHOCARDIOGRAM (TEE);  Surgeon: Elease Hashimoto Deloris Ping, MD;  Location: [facility] ENDOSCOPY;  Service: Cardiovascular;  Laterality: N/A;   TOTAL KNEE ARTHROPLASTY Right 04/20/2014   Procedure: RIGHT TOTAL KNEE ARTHROPLASTY CONVERTED TO RIGHT KNEE REIMPLANTATION;  Surgeon: Kathryne Hitch, MD;  Location: [facility] ORS;  Service: Orthopedics;  Laterality: Right;   TOTAL KNEE ARTHROPLASTY Left 08/23/2015   Procedure: LEFT TOTAL KNEE ARTHROPLASTY;  Surgeon: Kathryne Hitch, MD;  Location: [facility]  ORS;  Service: Orthopedics;  Laterality: Left;   VENTRAL HERNIA REPAIR     x2   Family History Family History  Problem Relation Age of Onset   Diabetes Mother    Hypertension Mother        entire family   Breast cancer Mother        diagnosed in her 81's   Stroke Father        CVA   Hyperlipidemia Father        entire family   Diabetes Father    Heart disease Father        No details.  Not at an early age.     Crohn's disease Son    Irritable bowel syndrome Son    Colon cancer Neg Hx    Colon polyps Neg Hx    Esophageal cancer Neg Hx    Stomach cancer Neg Hx    Rectal cancer Neg Hx     Social History Social History   Tobacco Use   Smoking status: Never    Passive exposure: Never   Smokeless tobacco: Never  Vaping Use   Vaping Use: Never used  Substance Use Topics   Alcohol use: Not Currently    Comment: rarely in the past   Drug use: Never   Allergies Other and Penicillins  Review of Systems Review of Systems  Gastrointestinal:  Positive for abdominal pain and constipation.    Physical Exam Vital Signs  I have reviewed the triage vital signs BP 136/60   Pulse 61   Temp 98.7 F (37.1 C) (Oral)   Resp 15   SpO2 97%   Physical Exam Vitals and nursing note reviewed.  Constitutional:      General: She is not in acute distress.    Appearance: She is well-developed.  HENT:     Head: Normocephalic and atraumatic.  Eyes:     Conjunctiva/sclera: Conjunctivae normal.  Cardiovascular:     Rate and Rhythm: Normal rate and regular rhythm.     Heart sounds: No murmur heard. Pulmonary:     Effort: Pulmonary effort is normal. No respiratory distress.     Breath sounds: Normal breath sounds.  Abdominal:     Palpations: Abdomen is soft.     Tenderness: There is abdominal tenderness in the left lower quadrant.  Musculoskeletal:        General: No swelling.     Cervical back: Neck supple.  Skin:    General: Skin is warm and dry.     Capillary Refill:  Capillary refill takes less than 2 seconds.  Neurological:     Mental Status: She is alert.  Psychiatric:        Mood and Affect: Mood normal.     ED Results and Treatments Labs (all labs ordered are listed, but only abnormal results are displayed) Labs Reviewed  COMPREHENSIVE METABOLIC PANEL - Abnormal; Notable for the following components:      Result Value   Glucose, Bld 112 (*)    Creatinine, Ser 1.18 (*)    Total Protein 6.1 (*)    GFR, Estimated 50 (*)    All other components within normal limits  CBC - Abnormal; Notable for the following components:   RBC 3.73 (*)    All other components within normal limits  LIPASE, BLOOD  URINALYSIS, ROUTINE W REFLEX MICROSCOPIC                                                                                                                          Radiology CT ABDOMEN PELVIS W CONTRAST  Result Date: 02/28/2023 CLINICAL DATA:  Left lower quadrant pain EXAM: CT ABDOMEN AND PELVIS WITH CONTRAST TECHNIQUE: Multidetector CT imaging of the abdomen and pelvis was performed using the standard protocol following bolus administration of intravenous contrast. RADIATION DOSE REDUCTION: This exam was performed according to the departmental dose-optimization program which includes automated exposure control, adjustment of the mA and/or kV according to patient size and/or use of iterative reconstruction technique. CONTRAST:  75mL OMNIPAQUE IOHEXOL 350 MG/ML SOLN COMPARISON:  CT abdomen and pelvis 02/26/2023 FINDINGS: Lower chest: No acute abnormality. Hepatobiliary: Patient is status post cholecystectomy. Pneumobilia again noted. No focal liver lesions are seen. Pancreas: Unremarkable. No pancreatic ductal dilatation or surrounding inflammatory changes. Spleen: Normal in size without focal abnormality. Adrenals/Urinary Tract: Left renal cortical cyst measures 13 mm. Otherwise, the adrenal glands, kidneys and bladder are within normal limits. Stomach/Bowel:  Small hiatal hernia present. Stomach is within normal limits. Appendix appears normal. No evidence of bowel wall thickening, distention, or inflammatory changes. There is sigmoid colon diverticulosis. There is a large amount of stool throughout the colon. Vascular/Lymphatic: No significant vascular findings are present. No enlarged abdominal or pelvic lymph nodes. Reproductive: Status post hysterectomy. No adnexal masses. Other: No abdominal wall hernia or abnormality. No abdominopelvic ascites. Musculoskeletal: Degenerative changes affect the spine IMPRESSION: 1. No acute localizing  process in the abdomen or pelvis. 2. Sigmoid colon diverticulosis without evidence for diverticulitis. 3. Large amount of stool throughout the colon. 4. Small hiatal hernia. Electronically Signed   By: Darliss Cheney M.D.   On: 02/28/2023 19:18    Pertinent labs & imaging results that were available during my care of the patient were reviewed by me and considered in my medical decision making (see MDM for details).  Medications Ordered in ED Medications  ondansetron (ZOFRAN) injection 4 mg (4 mg Intravenous Given 02/28/23 1826)  lactated ringers bolus 1,000 mL (0 mLs Intravenous Stopped 02/28/23 2052)  morphine (PF) 4 MG/ML injection 4 mg (4 mg Intravenous Given 02/28/23 1829)  iohexol (OMNIPAQUE) 350 MG/ML injection 75 mL (75 mLs Intravenous Contrast Given 02/28/23 1855)  magnesium citrate solution 1 Bottle (1 Bottle Oral Given 02/28/23 2030)                                                                                                                                     Procedures Procedures  (including critical care time)  Medical Decision Making / ED Course   This patient presents to the ED for concern of abdominal pain, this involves an extensive number of treatment options, and is a complaint that carries with it a high risk of complications and morbidity.  The differential diagnosis includes diverticulitis,  epiploic appendagitis, colitis, gastroenteritis, constipation, nephrolithiasis, inflammatory bowel disease,   MDM: Patient seen emergency room for evaluation of left lower quadrant abdominal pain.  Physical exam with some mild tenderness left lower quadrant but no rebound or guarding.  Laboratory evaluation with a creatinine of 1.18 but is otherwise unremarkable.  No significant leukocytosis.  Urinalysis unremarkable.  CT abdomen pelvis does not show evidence of breakthrough diverticulitis but does show evidence of constipation.  We trialed multiple different laxative regimens here in the emergency department including magnesium citrate, soapsuds enema and a glycerin suppository and the patient was unable to defecate here in the emergency department.  There does not appear to be a large stool ball on CT and I do not think patient would benefit from manual disimpaction at this time.  Thus we will plan for an aggressive bowel cleanout at home with [employer] and she will call her gastroenterologist for follow-up.  At this time she does not meet inpatient criteria for admission and she will be discharged with outpatient bowel cleanout and follow-up.  Given that she has had multiple CT scans including today and previous CT angiography, lower suspicion for more nebulous pathologies such as chronic mesenteric ischemia.   Additional history obtained: -Additional history obtained from husband -External records from outside source obtained and reviewed including: Chart review including previous notes, labs, imaging, consultation notes   Lab Tests: -I ordered, reviewed, and interpreted labs.   The pertinent results include:   Labs Reviewed  COMPREHENSIVE METABOLIC PANEL - Abnormal; Notable for the following components:  Result Value   Glucose, Bld 112 (*)    Creatinine, Ser 1.18 (*)    Total Protein 6.1 (*)    GFR, Estimated 50 (*)    All other components within normal limits  CBC - Abnormal; Notable  for the following components:   RBC 3.73 (*)    All other components within normal limits  LIPASE, BLOOD  URINALYSIS, ROUTINE W REFLEX MICROSCOPIC       Imaging Studies ordered: I ordered imaging studies including CT abdomen pelvis I independently visualized and interpreted imaging. I agree with the radiologist interpretation   Medicines ordered and prescription drug management: Meds ordered this encounter  Medications   ondansetron (ZOFRAN) injection 4 mg   lactated ringers bolus 1,000 mL   morphine (PF) 4 MG/ML injection 4 mg   iohexol (OMNIPAQUE) 350 MG/ML injection 75 mL   magnesium citrate solution 1 Bottle   DISCONTD: glycerin (Pediatric) 1 g suppository 1 g   polyethylene glycol (GOLYTELY) 236 g solution    Sig: Take 4,000 mLs by mouth once for 1 dose.    Dispense:  4000 mL    Refill:  0    -I have reviewed the patients home medicines and have made adjustments as needed  Critical interventions none    Cardiac Monitoring: The patient was maintained on a cardiac monitor.  I personally viewed and interpreted the cardiac monitored which showed an underlying rhythm of: NSR  Social Determinants of Health:  Factors impacting patients care include: none   Reevaluation: After the interventions noted above, I reevaluated the patient and found that they have :stayed the same  Co morbidities that complicate the patient evaluation  Past Medical History:  Diagnosis Date   Arthritis    PAIN AND OA BOTH KNEES AND SHOULDERS AND ELBOWS AND WRIST   Blood transfusion without reported diagnosis 2018   after cholecystectomy   Broken foot, right, closed, initial encounter 09/15/2021   no surgery needed, fell at home and went to ED   Diabetes mellitus    ORAL MEDICATION   Diverticulitis    GERD (gastroesophageal reflux disease)    Gout    NO RECENT FLARE UPS   H/O: rheumatic fever    AS A CHILD - NO KNOWN HEART MURMUR OR HEART PROBLEMS   Heart attack (HCC)     Hyperlipidemia    Hypertension    PFO (patent foramen ovale)    Stroke (HCC) 03/2017      Dispostion: I considered admission for this patient, but at this time she does not meet inpatient criteria for admission and she is safe for discharge with outpatient follow-up     Final Clinical Impression(s) / ED Diagnoses Final diagnoses:  Constipation, unspecified constipation type     @PCDICTATION @    Glendora Score, MD 03/01/23 1411

## 2023-03-02 ENCOUNTER — Emergency Department (HOSPITAL_COMMUNITY)
Admission: EM | Admit: 2023-03-02 | Discharge: 2023-03-02 | Disposition: A | Discharge status: Home / Self Care | Payer: BC Managed Care – PPO | Attending: Emergency Medicine | Admitting: Emergency Medicine

## 2023-03-02 ENCOUNTER — Emergency Department (HOSPITAL_COMMUNITY): Payer: BC Managed Care – PPO

## 2023-03-02 ENCOUNTER — Encounter (HOSPITAL_COMMUNITY): Payer: Self-pay

## 2023-03-02 ENCOUNTER — Other Ambulatory Visit: Payer: Self-pay

## 2023-03-02 DIAGNOSIS — K5900 Constipation, unspecified: Secondary | ICD-10-CM

## 2023-03-02 DIAGNOSIS — Z7901 Long term (current) use of anticoagulants: Secondary | ICD-10-CM | POA: Insufficient documentation

## 2023-03-02 DIAGNOSIS — Z79899 Other long term (current) drug therapy: Secondary | ICD-10-CM | POA: Insufficient documentation

## 2023-03-02 DIAGNOSIS — G8929 Other chronic pain: Secondary | ICD-10-CM | POA: Diagnosis not present

## 2023-03-02 DIAGNOSIS — I10 Essential (primary) hypertension: Secondary | ICD-10-CM | POA: Insufficient documentation

## 2023-03-02 DIAGNOSIS — K625 Hemorrhage of anus and rectum: Secondary | ICD-10-CM | POA: Diagnosis not present

## 2023-03-02 DIAGNOSIS — Z8673 Personal history of transient ischemic attack (TIA), and cerebral infarction without residual deficits: Secondary | ICD-10-CM | POA: Insufficient documentation

## 2023-03-02 DIAGNOSIS — I2510 Atherosclerotic heart disease of native coronary artery without angina pectoris: Secondary | ICD-10-CM | POA: Insufficient documentation

## 2023-03-02 DIAGNOSIS — R109 Unspecified abdominal pain: Secondary | ICD-10-CM | POA: Diagnosis present

## 2023-03-02 LAB — URINALYSIS, ROUTINE W REFLEX MICROSCOPIC
Bilirubin Urine: NEGATIVE
Glucose, UA: NEGATIVE mg/dL
Ketones, ur: NEGATIVE mg/dL
Leukocytes,Ua: NEGATIVE
Nitrite: NEGATIVE
Protein, ur: NEGATIVE mg/dL
Specific Gravity, Urine: 1.012 (ref 1.005–1.030)
pH: 6 (ref 5.0–8.0)

## 2023-03-02 LAB — CBC WITH DIFFERENTIAL/PLATELET
Abs Immature Granulocytes: 0.03 10*3/uL (ref 0.00–0.07)
Basophils Absolute: 0 10*3/uL (ref 0.0–0.1)
Basophils Relative: 0 %
Eosinophils Absolute: 0 10*3/uL (ref 0.0–0.5)
Eosinophils Relative: 0 %
HCT: 36.9 % (ref 36.0–46.0)
Hemoglobin: 12.4 g/dL (ref 12.0–15.0)
Immature Granulocytes: 1 %
Lymphocytes Relative: 22 %
Lymphs Abs: 1.2 10*3/uL (ref 0.7–4.0)
MCH: 32.9 pg (ref 26.0–34.0)
MCHC: 33.6 g/dL (ref 30.0–36.0)
MCV: 97.9 fL (ref 80.0–100.0)
Monocytes Absolute: 0.2 10*3/uL (ref 0.1–1.0)
Monocytes Relative: 4 %
Neutro Abs: 3.8 10*3/uL (ref 1.7–7.7)
Neutrophils Relative %: 73 %
Platelets: 165 10*3/uL (ref 150–400)
RBC: 3.77 MIL/uL — ABNORMAL LOW (ref 3.87–5.11)
RDW: 12.4 % (ref 11.5–15.5)
WBC: 5.2 10*3/uL (ref 4.0–10.5)
nRBC: 0 % (ref 0.0–0.2)

## 2023-03-02 LAB — TYPE AND SCREEN
ABO/RH(D): A POS
Antibody Screen: NEGATIVE

## 2023-03-02 LAB — COMPREHENSIVE METABOLIC PANEL
ALT: 12 U/L (ref 0–44)
AST: 17 U/L (ref 15–41)
Albumin: 3.6 g/dL (ref 3.5–5.0)
Alkaline Phosphatase: 45 U/L (ref 38–126)
Anion gap: 15 (ref 5–15)
BUN: 16 mg/dL (ref 8–23)
CO2: 23 mmol/L (ref 22–32)
Calcium: 9.7 mg/dL (ref 8.9–10.3)
Chloride: 103 mmol/L (ref 98–111)
Creatinine, Ser: 1.23 mg/dL — ABNORMAL HIGH (ref 0.44–1.00)
GFR, Estimated: 47 mL/min — ABNORMAL LOW (ref >60)
Glucose, Bld: 138 mg/dL — ABNORMAL HIGH (ref 70–99)
Potassium: 3.9 mmol/L (ref 3.5–5.1)
Sodium: 141 mmol/L (ref 135–145)
Total Bilirubin: 0.5 mg/dL (ref 0.3–1.2)
Total Protein: 5.9 g/dL — ABNORMAL LOW (ref 6.5–8.1)

## 2023-03-02 LAB — POC OCCULT BLOOD, ED: Fecal Occult Bld: POSITIVE — AB

## 2023-03-02 MED ORDER — METOCLOPRAMIDE HCL 5 MG/ML IJ SOLN
10.0000 mg | Freq: Once | INTRAMUSCULAR | Status: AC
  Administered 2023-03-02: 10 mg via INTRAVENOUS
  Filled 2023-03-02: qty 2

## 2023-03-02 MED ORDER — IOHEXOL 350 MG/ML SOLN
75.0000 mL | Freq: Once | INTRAVENOUS | Status: AC | PRN
  Administered 2023-03-02: 75 mL via INTRAVENOUS

## 2023-03-02 NOTE — Discharge Instructions (Signed)
You were seen for the blood in your stool and your constipation in the emergency department.   At home, please take MiraLAX every day to prevent worsening constipation.    Check your MyChart online for the results of any tests that had not resulted by the time you left the emergency department.   Follow-up with your primary doctor in 2-3 days regarding your visit.  Follow-up with your GI doctor in 1 week regarding your symptoms.  Return immediately to the emergency department if you experience any of the following: Bleeding that fills up the toilet bowl, dizziness, chest discomfort, or any other concerning symptoms.    Thank you for visiting our Emergency Department. It was a pleasure taking care of you today.

## 2023-03-02 NOTE — ED Provider Notes (Signed)
[place] EMERGENCY DEPARTMENT AT [facility] Provider Note   CSN: [id number] Arrival date & time: 03/02/23  1339     History  Chief Complaint  Patient presents with   Abdominal Pain   Rectal Bleeding    Rachel Black is a 72 y.o. female.  72 year old female with a history of GERD, diverticulitis, CAD and stroke on Eliquis, hypertension, and hyperlipidemia who presents emergency department with rectal bleeding.  Patient reports that she has chronic abdominal pain that is generalized and not exacerbated or alleviated by anything.  Was constipated and came to the emergency department on 6/14 after not having a bowel movement since 6/11 and had a CT scan that showed possible early diverticulitis.  Was started on amoxicillin and discharged home on a bowel regimen.  Says that she also presented to the emergency department again on 02/28/2023 although started on a bowel cleanout.  Today after having her first bowel movement she did notice some blood when she wiped.  Also has had melena for 2 to 3 months.  Does take iron.  Is on Eliquis.  Had a C-section and hernia repair abdominal surgery.   Had a colonoscopy on 10/13/2021 that showed multiple polyps that were removed.  Also had an EGD on 04/10/2022 that showed multiple gastric polyps and esophageal stricture.  This was with Dr. Marina Goodell from a viral GI.        Home Medications Prior to Admission medications   Medication Sig Start Date End Date Taking? Authorizing Provider  ACCU-CHEK AVIVA PLUS test strip USE AS INSTRUCTED ONCE A DAY. DX CODE E11.9 07/20/22   Mayer Masker, PA-C  Accu-Chek Softclix Lancets lancets Use as instructed once a day.  DX Code: E11.9 03/24/22   Mayer Masker, PA-C  acetaminophen (TYLENOL) 500 MG tablet Take 1,000 mg by mouth every 6 (six) hours as needed for mild pain or moderate pain.    [provider]  allopurinol (ZYLOPRIM) 300 MG tablet TAKE 1 TABLET BY MOUTH TWICE A DAY 08/27/22   Carlean Jews, NP  amoxicillin-clavulanate (AUGMENTIN) 875-125 MG tablet Take 1 tablet by mouth 2 (two) times daily for 7 days. 02/27/23 03/06/23  Gowens, Dow Adolph L, PA-C  apixaban (ELIQUIS) 5 MG TABS tablet Take 1 tablet (5 mg total) by mouth 2 (two) times daily. 02/09/23   Saralyn Pilar A, PA  CALCIUM PO Take 2 tablets by mouth daily.    [provider]  dicyclomine (BENTYL) 10 MG capsule Take 1 capsule (10 mg total) by mouth 3 (three) times daily as needed for spasms. 02/20/23   Tilden Fossa, MD  Emollient (UDDERLY SMOOTH) CREA Apply 1 application topically See admin instructions. Apply topically to skin folds as needed for rash/irritation    [provider]  famotidine (PEPCID) 10 MG tablet Take 10 mg by mouth daily as needed for heartburn or indigestion.    [provider]  fenofibrate 54 MG tablet Take 1 tablet (54 mg total) by mouth daily. 01/26/23   Drema Dallas, MD  ferrous sulfate 325 (65 FE) MG tablet TAKE 1 TABLET BY MOUTH TWICE A DAY Patient taking differently: Take 325 mg by mouth daily. 08/03/22   Zehr, Princella Pellegrini, PA-C  gabapentin (NEURONTIN) 300 MG capsule Take 1 capsule (300 mg total) by mouth at bedtime. 02/23/23   Gloris Manchester, MD  isosorbide mononitrate (IMDUR) 30 MG 24 hr tablet Take 30 mg by mouth daily. 11/30/22   [provider]  olmesartan (BENICAR) 5 MG  tablet TAKE 1 TABLET (5 MG TOTAL) BY MOUTH DAILY. 02/05/23   Carlean Jews, NP  ondansetron (ZOFRAN) 4 MG tablet Take 1 tablet (4 mg total) by mouth every 6 (six) hours. 02/20/23   Loetta Rough, MD  pantoprazole (PROTONIX) 40 MG tablet Take 40 mg by mouth daily. 12/26/22   [provider]  rosuvastatin (CRESTOR) 40 MG tablet TAKE 1 TABLET BY MOUTH EVERY DAY 01/04/23   Rollene Rotunda, MD      Allergies    Other and Penicillins    Review of Systems   Review of Systems  Physical Exam Updated Vital Signs BP (!) 153/71 (BP Location: Left Arm)   Pulse (!) 58   Temp 98.3 F (36.8  C) (Oral)   Resp 19   Ht 5\' 6"  (1.676 m)   Wt 86.6 kg   SpO2 100%   BMI 30.83 kg/m  Physical Exam Vitals and nursing note reviewed.  Constitutional:      General: She is not in acute distress.    Appearance: She is well-developed.  HENT:     Head: Normocephalic and atraumatic.     Right Ear: External ear normal.     Left Ear: External ear normal.     Nose: Nose normal.  Eyes:     Extraocular Movements: Extraocular movements intact.     Conjunctiva/sclera: Conjunctivae normal.     Pupils: Pupils are equal, round, and reactive to light.  Cardiovascular:     Rate and Rhythm: Normal rate and regular rhythm.     Heart sounds: No murmur heard. Pulmonary:     Effort: Pulmonary effort is normal. No respiratory distress.     Breath sounds: Normal breath sounds.  Abdominal:     General: Abdomen is flat. There is no distension.     Palpations: Abdomen is soft. There is no mass.     Tenderness: There is abdominal tenderness (diffuse mild). There is no guarding.  Genitourinary:    Comments: Chaperoned by patient's RN. External hemorrhoids noted on external inspection. Also appears to have a fissure. No internal masses noted. Normal rectal tone. Scant amount of bright red blood on stool noted. Musculoskeletal:     Cervical back: Normal range of motion and neck supple.     Right lower leg: No edema.     Left lower leg: No edema.  Skin:    General: Skin is warm and dry.  Neurological:     Mental Status: She is alert and oriented to person, place, and time. Mental status is at baseline.  Psychiatric:        Mood and Affect: Mood normal.     ED Results / Procedures / Treatments   Labs (all labs ordered are listed, but only abnormal results are displayed) Labs Reviewed  CBC WITH DIFFERENTIAL/PLATELET - Abnormal; Notable for the following components:      Result Value   RBC 3.77 (*)    All other components within normal limits  COMPREHENSIVE METABOLIC PANEL - Abnormal; Notable for  the following components:   Glucose, Bld 138 (*)    Creatinine, Ser 1.23 (*)    Total Protein 5.9 (*)    GFR, Estimated 47 (*)    All other components within normal limits  URINALYSIS, ROUTINE W REFLEX MICROSCOPIC - Abnormal; Notable for the following components:   Hgb urine dipstick SMALL (*)    Bacteria, UA RARE (*)    All other components within normal limits  POC OCCULT BLOOD, ED -  Abnormal; Notable for the following components:   Fecal Occult Bld POSITIVE (*)    All other components within normal limits  TYPE AND SCREEN    EKG None  Radiology CT ABDOMEN PELVIS W CONTRAST  Result Date: 03/02/2023 CLINICAL DATA:  Lower GI bleeding EXAM: CT ABDOMEN AND PELVIS WITH CONTRAST TECHNIQUE: Multidetector CT imaging of the abdomen and pelvis was performed using the standard protocol following bolus administration of intravenous contrast. RADIATION DOSE REDUCTION: This exam was performed according to the departmental dose-optimization program which includes automated exposure control, adjustment of the mA and/or kV according to patient size and/or use of iterative reconstruction technique. CONTRAST:  75mL OMNIPAQUE IOHEXOL 350 MG/ML SOLN COMPARISON:  CT abdomen and pelvis 02/28/2023 FINDINGS: Lower chest: No acute abnormality. Hepatobiliary: Pneumobilia is again seen. The gallbladder is surgically absent. No focal liver lesions are identified. Pancreas: Unremarkable. No pancreatic ductal dilatation or surrounding inflammatory changes. Spleen: Normal in size without focal abnormality. Adrenals/Urinary Tract: 12 mm left renal cortical cyst is unchanged. There is no hydronephrosis or perinephric fat stranding. The adrenal glands and bladder are within normal limits. Stomach/Bowel: Small hiatal hernia is present. Stomach is otherwise within normal limits. Appendix is not seen. No evidence of bowel wall thickening, distention, or inflammatory changes. There is descending and sigmoid colon diverticulosis.  Vascular/Lymphatic: No significant vascular findings are present. No enlarged abdominal or pelvic lymph nodes. Reproductive: Status post hysterectomy. No adnexal masses. Other: No abdominal wall hernia or abnormality. No abdominopelvic ascites. Musculoskeletal: There are degenerative changes at L3-L4. IMPRESSION: 1. No acute localizing process in the abdomen or pelvis. 2. Colonic diverticulosis. 3. Small hiatal hernia. Electronically Signed   By: Darliss Cheney M.D.   On: 03/02/2023 18:36    Procedures Procedures    Medications Ordered in ED Medications  iohexol (OMNIPAQUE) 350 MG/ML injection 75 mL (75 mLs Intravenous Contrast Given 03/02/23 1745)  metoCLOPramide (REGLAN) injection 10 mg (10 mg Intravenous Given 03/02/23 1924)    ED Course/ Medical Decision Making/ A&P Clinical Course as of 03/03/23 1009  Tue Mar 02, 2023  2009 Dr Rhea Belton consulted. Recommends GI fu and PCP fu. Does not feel that patient requires admission at this time. Will arrange for appointment.  [RP]    Clinical Course User Index [RP] Rondel Baton, MD                             Medical Decision Making Risk Prescription drug management.   LUE DUNCKEL is a 72 y.o. female with comorbidities that complicate the patient evaluation including GERD, diverticulitis, CAD and stroke on Eliquis, hypertension, and hyperlipidemia who presents emergency department with rectal bleeding after constipation and chronic abdominal pain  Initial Ddx:  Lower GI bleed, constipation, fissure, diverticulosis, cancer, diverticulitis, internal hemorrhoid  MDM/Course:  Unclear exactly what is causing the patient's abdominal pain.  Has not had multiple visits to the emergency department with reassuring CT scans aside from 1 that did show possible early developing diverticulitis.  Had a CT scan ordered from triage today which did not show any acute abnormalities.  Did have a small amount of blood on her rectal exam and does have some  fissures and hemorrhoids noted.  I suspect that these are likely the sources and this could be exacerbated by the fact that she was constipated for a long period of time and had her first bowel movement prior to having a small amount of rectal bleeding.  Not having any symptoms of anemia currently is hemodynamically stable.  Was monitored for several hours in the emergency department did not have any large amount of rectal bleeding at all.  Did discuss with GI who will see her in clinic in several days.  With the small amount of bleeding do not feel that she necessarily needs to hold her Eliquis at this point in time but if she continues to have bleeding may be a good idea to stop the medication for a brief period of time.   This patient presents to the ED for concern of complaints listed in HPI, this involves an extensive number of treatment options, and is a complaint that carries with it a high risk of complications and morbidity. Disposition including potential need for admission considered.   Dispo: DC Home. Return precautions discussed including, but not limited to, those listed in the AVS. Allowed pt time to ask questions which were answered fully prior to dc.  Additional history obtained from spouse Records reviewed Outpatient Clinic Notes The following labs were independently interpreted: CBC and show no acute abnormality I independently reviewed the following imaging with scope of interpretation limited to determining acute life threatening conditions related to emergency care: CT Abdomen/Pelvis and agree with the radiologist interpretation with the following exceptions: none I personally reviewed and interpreted cardiac monitoring: normal sinus rhythm  I personally reviewed and interpreted the pt's EKG: see above for interpretation  I have reviewed the patients home medications and made adjustments as needed Consults: Gastroenterology Social Determinants of health:   Elderly         Final Clinical Impression(s) / ED Diagnoses Final diagnoses:  Constipation, unspecified constipation type  Rectal bleeding    Rx / DC Orders ED Discharge Orders     None         Rondel Baton, MD 03/03/23 1009

## 2023-03-02 NOTE — ED Triage Notes (Addendum)
Pt c/o chronic generalized abdominal pain and bright, red rectal bleeding this morning.  Pain score 8/10.  Sts 3-4 episodes of bleeding.  Bleeding only when wiping.

## 2023-03-02 NOTE — ED Provider Triage Note (Signed)
Emergency Medicine Provider Triage Evaluation Note  Rachel Black , a 72 y.o. female  was evaluated in triage.  Pt complains of generalized abdominal pain. She reports that she was seen in the emergency department and was given a bowel regimen.  She reports that now her stools are soft but are black.  She also reports that she noticed some bright red blood whenever she was wiping but no blood in the toilet.  She denies any constipation or any hard passage of her stools.  Denies any nausea or vomiting. Denies any history of any GI bleeding.  She currently is on a blood thinner.  Review of Systems  Positive:  Negative:   Physical Exam  BP 116/73   Pulse 90   Temp 99.5 F (37.5 C)   Resp 18   Ht 5\' 6"  (1.676 m)   Wt 86.6 kg   SpO2 97%   BMI 30.83 kg/m  Gen:   Awake, no distress   Resp:  Normal effort  MSK:   Moves extremities without difficulty  Other:  Diffuse abdominal tenderness. Soft. No guarding or rebound.   Medical Decision Making  Medically screening exam initiated at 1:53 PM.  Appropriate orders placed.  [name] was informed that the remainder of the evaluation will be completed by another provider, this initial triage assessment does not replace that evaluation, and the importance of remaining in the ED until their evaluation is complete.  Patient has been here multiple times for similar presentation with abdominal pain.  I do not see any history of any melena or hematochezia that she is complained of before.  She is already had 3 CT scans in the month of June however now is complaining of new complaints again of melena hematochezia and on a blood thinner.  Will order CT scan and labs.   Achille Rich, PA-C 03/02/23 1355

## 2023-03-03 ENCOUNTER — Telehealth: Payer: Self-pay

## 2023-03-03 ENCOUNTER — Other Ambulatory Visit: Payer: Self-pay

## 2023-03-03 NOTE — Telephone Encounter (Signed)
  Called and spoke with Rachel Black and offered her an appt today at 2pm, Rachel Black states she cannot come today. Let her know the doctor wanted her to be seen urgently but she reports she cannot come. Let her know we will contact her when the next appt becomes availalble. Dr.  Rhea Belton notified.

## 2023-03-03 NOTE — Telephone Encounter (Signed)
Called Rachel Black back and offered her an appt for tomorrow at 1:30pm. Rachel Black states she does not drive and would have to arrange her ride and she cannot come tomorrow either. Asked her if morning or afternoon appts worked better and she stated neither. Dr. Rhea Belton aware.

## 2023-03-04 ENCOUNTER — Emergency Department (HOSPITAL_COMMUNITY)
Admission: EM | Admit: 2023-03-04 | Discharge: 2023-03-04 | Disposition: A | Discharge status: Home / Self Care | Payer: BC Managed Care – PPO | Attending: Emergency Medicine | Admitting: Emergency Medicine

## 2023-03-04 ENCOUNTER — Ambulatory Visit: Payer: BC Managed Care – PPO | Admitting: Physician Assistant

## 2023-03-04 ENCOUNTER — Other Ambulatory Visit: Payer: Self-pay

## 2023-03-04 ENCOUNTER — Encounter (HOSPITAL_COMMUNITY): Payer: Self-pay

## 2023-03-04 ENCOUNTER — Other Ambulatory Visit: Payer: Self-pay | Admitting: Nurse Practitioner

## 2023-03-04 DIAGNOSIS — G8929 Other chronic pain: Secondary | ICD-10-CM | POA: Diagnosis not present

## 2023-03-04 DIAGNOSIS — M1000 Idiopathic gout, unspecified site: Secondary | ICD-10-CM

## 2023-03-04 DIAGNOSIS — R110 Nausea: Secondary | ICD-10-CM | POA: Insufficient documentation

## 2023-03-04 DIAGNOSIS — R109 Unspecified abdominal pain: Secondary | ICD-10-CM | POA: Insufficient documentation

## 2023-03-04 DIAGNOSIS — E785 Hyperlipidemia, unspecified: Secondary | ICD-10-CM

## 2023-03-04 LAB — URINALYSIS, ROUTINE W REFLEX MICROSCOPIC
Bilirubin Urine: NEGATIVE
Glucose, UA: NEGATIVE mg/dL
Hgb urine dipstick: NEGATIVE
Ketones, ur: NEGATIVE mg/dL
Leukocytes,Ua: NEGATIVE
Nitrite: NEGATIVE
Protein, ur: NEGATIVE mg/dL
Specific Gravity, Urine: 1.006 (ref 1.005–1.030)
pH: 7 (ref 5.0–8.0)

## 2023-03-04 LAB — COMPREHENSIVE METABOLIC PANEL
ALT: 14 U/L (ref 0–44)
AST: 18 U/L (ref 15–41)
Albumin: 3.6 g/dL (ref 3.5–5.0)
Alkaline Phosphatase: 42 U/L (ref 38–126)
Anion gap: 7 (ref 5–15)
BUN: 15 mg/dL (ref 8–23)
CO2: 24 mmol/L (ref 22–32)
Calcium: 9.4 mg/dL (ref 8.9–10.3)
Chloride: 105 mmol/L (ref 98–111)
Creatinine, Ser: 1.24 mg/dL — ABNORMAL HIGH (ref 0.44–1.00)
GFR, Estimated: 47 mL/min — ABNORMAL LOW (ref >60)
Glucose, Bld: 112 mg/dL — ABNORMAL HIGH (ref 70–99)
Potassium: 4.2 mmol/L (ref 3.5–5.1)
Sodium: 136 mmol/L (ref 135–145)
Total Bilirubin: 0.3 mg/dL (ref 0.3–1.2)
Total Protein: 6.1 g/dL — ABNORMAL LOW (ref 6.5–8.1)

## 2023-03-04 LAB — CBC
HCT: 36.8 % (ref 36.0–46.0)
Hemoglobin: 12.3 g/dL (ref 12.0–15.0)
MCH: 32.9 pg (ref 26.0–34.0)
MCHC: 33.4 g/dL (ref 30.0–36.0)
MCV: 98.4 fL (ref 80.0–100.0)
Platelets: 173 10*3/uL (ref 150–400)
RBC: 3.74 MIL/uL — ABNORMAL LOW (ref 3.87–5.11)
RDW: 12.4 % (ref 11.5–15.5)
WBC: 5.3 10*3/uL (ref 4.0–10.5)
nRBC: 0 % (ref 0.0–0.2)

## 2023-03-04 LAB — LIPASE, BLOOD: Lipase: 50 U/L (ref 11–51)

## 2023-03-04 MED ORDER — ACETAMINOPHEN 500 MG PO TABS
1000.0000 mg | ORAL_TABLET | Freq: Once | ORAL | Status: AC
  Administered 2023-03-04: 1000 mg via ORAL
  Filled 2023-03-04: qty 2

## 2023-03-04 MED ORDER — DICYCLOMINE HCL 10 MG PO CAPS
10.0000 mg | ORAL_CAPSULE | Freq: Once | ORAL | Status: AC
  Administered 2023-03-04: 10 mg via ORAL
  Filled 2023-03-04: qty 1

## 2023-03-04 MED ORDER — DICYCLOMINE HCL 20 MG PO TABS: 20.0000 mg | ORAL_TABLET | Freq: Two times a day (BID) | ORAL | 0 refills | Status: DC

## 2023-03-04 NOTE — ED Triage Notes (Addendum)
Pt came in via POV d/t abd pain the last 2 days, endorses nausea, denies vomiting. A/Ox4, rates chronic abd pain 8/10. Last BM this morning.

## 2023-03-04 NOTE — Discharge Instructions (Addendum)
Please continue calling the gastroenterologist for an appointment as soon as possible.    Continue taking your miralax and bentyl.  Stay hydrated.  Eat a high fiber diet.

## 2023-03-04 NOTE — ED Provider Notes (Signed)
[facility]-EM[facility]GENCY DEPT [facility] Emergency Department Provider Note MRN:  [id number]  Arrival date & time: 03/04/23     Chief Complaint   Abdominal Pain   History of Present Illness   Rachel Black is a 72 y.o. year-old female presents to the ED with chief complaint of abdominal pain for a year. States that her pain has worsened over the past 2 days, with this morning being the worst.  Has hx of diverticulitis and constipation.  Reports nausea, but no vomiting. Denies diarrhea.  Had last BM this morning.  Has been working to see GI, but hasn't been seen yet.  No real changes in symptoms.  History provided by patient.   Review of Systems  Pertinent positive and negative review of systems noted in HPI.    Physical Exam   Vitals:   03/04/23 1904 03/04/23 2139  BP: (!) 122/94 (!) 128/106  Pulse: (!) 46 65  Resp: 16 17  Temp: 98 F (36.7 C) 98.5 F (36.9 C)  SpO2: 100% 100%    CONSTITUTIONAL:  non toxic-appearing, NAD NEURO:  Alert and oriented x 3, CN 3-12 grossly intact EYES:  eyes equal and reactive ENT/NECK:  Supple, no stridor  CARDIO:  normal rate on my exam, regular rhythm, appears well-perfused  PULM:  No respiratory distress, CTAB GI/GU:  non-distended,  MSK/SPINE:  No gross deformities, no edema, moves all extremities  SKIN:  no rash, atraumatic   *Additional and/or pertinent findings included in MDM below  Diagnostic and Interventional Summary    EKG Interpretation  Date/Time:    Ventricular Rate:    PR Interval:    QRS Duration:   QT Interval:    QTC Calculation:   R Axis:     Text Interpretation:         Labs Reviewed  COMPREHENSIVE METABOLIC PANEL - Abnormal; Notable for the following components:      Result Value   Glucose, Bld 112 (*)    Creatinine, Ser 1.24 (*)    Total Protein 6.1 (*)    GFR, Estimated 47 (*)    All other components within normal limits  CBC - Abnormal; Notable for the following components:   RBC 3.74 (*)     All other components within normal limits  URINALYSIS, ROUTINE W REFLEX MICROSCOPIC - Abnormal; Notable for the following components:   Color, Urine STRAW (*)    All other components within normal limits  LIPASE, BLOOD    No orders to display    Medications  acetaminophen (TYLENOL) tablet 1,000 mg (has no administration in time range)  dicyclomine (BENTYL) capsule 10 mg (has no administration in time range)     Procedures  /  Critical Care Procedures  ED Course and Medical Decision Making  I have reviewed the triage vital signs, the nursing notes, and pertinent available records from the EMR.  Social Determinants Affecting Complexity of Care: Patient has no clinically significant social determinants affecting this chief complaint..   ED Course:    Medical Decision Making Patient here for abdominal pain.  She states she has been having this intermittently for about 1 year.  Her symptoms worsened over the past couple of days.  Old charts are reviewed.  Patient has had 5 CTs in the past month with 3 in the past week without acute findings.  Patient's labs are reassuring.  She does not appear toxic.  She had a bowel movement earlier this morning.  I do not think that additional imaging  is indicated.  I do think that she would most benefit from close GI follow-up.  I have encouraged her to continue calling the GI office to see if there are any cancellations.  I will refill her Bentyl.  Pain treated with Tylenol.  Avoid opiates due to constipation.   Husband expresses some concern that the pain only seems to be severe while he is away at work.  He states that as soon as he comes home, patient's pain seems to improve.  Question an element of secondary gain.  Amount and/or Complexity of Data Reviewed Labs: ordered.  Risk OTC drugs. Prescription drug management.         Consultants: No consultations were needed in caring for this patient.   Treatment and Plan: Emergency  department workup does not suggest an emergent condition requiring admission or immediate intervention beyond  what has been performed at this time. The patient is safe for discharge and has  been instructed to return immediately for worsening symptoms, change in  symptoms or any other concerns    Final Clinical Impressions(s) / ED Diagnoses     ICD-10-CM   1. Chronic abdominal pain  R10.9    G89.29       ED Discharge Orders          Ordered    dicyclomine (BENTYL) 20 MG tablet  2 times daily        03/04/23 2254              Discharge Instructions Discussed with and Provided to Patient:     Discharge Instructions      Please continue calling the gastroenterologist for an appointment as soon as possible.    Continue taking your miralax and bentyl.  Stay hydrated.  Eat a high fiber diet.       Roxy Horseman, PA-C 03/04/23 2257    Mardene Sayer, MD 03/05/23 [phone]

## 2023-03-05 ENCOUNTER — Other Ambulatory Visit: Payer: Self-pay

## 2023-03-05 ENCOUNTER — Emergency Department (HOSPITAL_COMMUNITY)
Admission: EM | Admit: 2023-03-05 | Discharge: 2023-03-05 | Disposition: A | Discharge status: Home / Self Care | Payer: BC Managed Care – PPO | Attending: Emergency Medicine | Admitting: Emergency Medicine

## 2023-03-05 ENCOUNTER — Telehealth: Payer: Self-pay | Admitting: Internal Medicine

## 2023-03-05 ENCOUNTER — Encounter (HOSPITAL_COMMUNITY): Payer: Self-pay

## 2023-03-05 DIAGNOSIS — Z7901 Long term (current) use of anticoagulants: Secondary | ICD-10-CM | POA: Insufficient documentation

## 2023-03-05 DIAGNOSIS — Z8673 Personal history of transient ischemic attack (TIA), and cerebral infarction without residual deficits: Secondary | ICD-10-CM | POA: Diagnosis not present

## 2023-03-05 DIAGNOSIS — R109 Unspecified abdominal pain: Secondary | ICD-10-CM | POA: Diagnosis not present

## 2023-03-05 DIAGNOSIS — E119 Type 2 diabetes mellitus without complications: Secondary | ICD-10-CM | POA: Diagnosis not present

## 2023-03-05 LAB — CBC
HCT: 37.8 % (ref 36.0–46.0)
Hemoglobin: 12.4 g/dL (ref 12.0–15.0)
MCH: 31.9 pg (ref 26.0–34.0)
MCHC: 32.8 g/dL (ref 30.0–36.0)
MCV: 97.2 fL (ref 80.0–100.0)
Platelets: 172 10*3/uL (ref 150–400)
RBC: 3.89 MIL/uL (ref 3.87–5.11)
RDW: 12.5 % (ref 11.5–15.5)
WBC: 5.7 10*3/uL (ref 4.0–10.5)
nRBC: 0 % (ref 0.0–0.2)

## 2023-03-05 LAB — COMPREHENSIVE METABOLIC PANEL
ALT: 15 U/L (ref 0–44)
AST: 16 U/L (ref 15–41)
Albumin: 3.8 g/dL (ref 3.5–5.0)
Alkaline Phosphatase: 44 U/L (ref 38–126)
Anion gap: 13 (ref 5–15)
BUN: 16 mg/dL (ref 8–23)
CO2: 24 mmol/L (ref 22–32)
Calcium: 9.7 mg/dL (ref 8.9–10.3)
Chloride: 103 mmol/L (ref 98–111)
Creatinine, Ser: 1.24 mg/dL — ABNORMAL HIGH (ref 0.44–1.00)
GFR, Estimated: 47 mL/min — ABNORMAL LOW (ref >60)
Glucose, Bld: 115 mg/dL — ABNORMAL HIGH (ref 70–99)
Potassium: 4.2 mmol/L (ref 3.5–5.1)
Sodium: 140 mmol/L (ref 135–145)
Total Bilirubin: 0.7 mg/dL (ref 0.3–1.2)
Total Protein: 6.1 g/dL — ABNORMAL LOW (ref 6.5–8.1)

## 2023-03-05 LAB — URINALYSIS, ROUTINE W REFLEX MICROSCOPIC
Bilirubin Urine: NEGATIVE
Glucose, UA: NEGATIVE mg/dL
Hgb urine dipstick: NEGATIVE
Ketones, ur: NEGATIVE mg/dL
Leukocytes,Ua: NEGATIVE
Nitrite: NEGATIVE
Protein, ur: NEGATIVE mg/dL
Specific Gravity, Urine: 1.015 (ref 1.005–1.030)
pH: 5 (ref 5.0–8.0)

## 2023-03-05 LAB — LIPASE, BLOOD: Lipase: 53 U/L — ABNORMAL HIGH (ref 11–51)

## 2023-03-05 MED ORDER — SODIUM CHLORIDE 0.9 % IV BOLUS
500.0000 mL | Freq: Once | INTRAVENOUS | Status: AC
  Administered 2023-03-05: 500 mL via INTRAVENOUS

## 2023-03-05 MED ORDER — DICYCLOMINE HCL 10 MG PO CAPS
10.0000 mg | ORAL_CAPSULE | Freq: Once | ORAL | Status: AC
  Administered 2023-03-05: 10 mg via ORAL
  Filled 2023-03-05: qty 1

## 2023-03-05 NOTE — Telephone Encounter (Signed)
Pt scheduled to see Alcide Evener NP 03/15/23 at 1:30pm. Pt aware of appt. Discussed with her if she does not keep this appt it will be further out for an appt. She verbalized understanding.

## 2023-03-05 NOTE — Telephone Encounter (Signed)
Patient called requesting to speak with a nurse regarding ED follow up for chronic abdominal pain. Seeking further advise.

## 2023-03-05 NOTE — ED Triage Notes (Signed)
Pt arrived to ED POV c/o abd pain, denies N/V/D and constipation. Pt was just here yesterday. LBM today x2.

## 2023-03-05 NOTE — ED Provider Notes (Signed)
[place] EMERGENCY DEPARTMENT AT [facility] Provider Note   CSN: [id number] Arrival date & time: 03/05/23  1509     History  Chief Complaint  Patient presents with   Abdominal Pain    Rachel Black is a 72 y.o. female.   Abdominal Pain  Patient has a history of diabetes gout obesity choledocholithiasis however LFTs, stroke NSTEMI, recurrent abdominal pain, irritable bowel syndrome.  Pt states she has been having abdominal pain for months.  It hurts all over.  No vomiting or diarrhea.  No trouble urinating.  Pt states she continues to hurt.  She has not seen her gi doctor since this has been ongoing.  Patient has been to the ED multiple times for this.  Nothing is particularly changed today.  She has had 23 ED visits in the last 6 months.  Patient was seen in the emergency room yesterday.  She also had CT scans of her abdomen pelvis on the fourth teen for 16th and 18th.  Patient also had a CT angiogram on the eighth and she had several CT scans in May.  Patient was given a prescription for Bentyl.  She was not able to pick it up today.  Home Medications Prior to Admission medications   Medication Sig Start Date End Date Taking? Authorizing Provider  ACCU-CHEK AVIVA PLUS test strip USE AS INSTRUCTED ONCE A DAY. DX CODE E11.9 07/20/22   Mayer Masker, PA-C  Accu-Chek Softclix Lancets lancets Use as instructed once a day.  DX Code: E11.9 03/24/22   Mayer Masker, PA-C  acetaminophen (TYLENOL) 500 MG tablet Take 1,000 mg by mouth every 6 (six) hours as needed for mild pain or moderate pain.    [provider]  allopurinol (ZYLOPRIM) 300 MG tablet TAKE 1 TABLET BY MOUTH TWICE A DAY 03/04/23   Carlean Jews, NP  amoxicillin-clavulanate (AUGMENTIN) 875-125 MG tablet Take 1 tablet by mouth 2 (two) times daily for 7 days. 02/27/23 03/06/23  Gowens, Dow Adolph L, PA-C  apixaban (ELIQUIS) 5 MG TABS tablet Take 1 tablet (5 mg total) by mouth 2 (two) times daily. 02/09/23    Saralyn Pilar A, PA  CALCIUM PO Take 2 tablets by mouth daily.    [provider]  dicyclomine (BENTYL) 10 MG capsule Take 1 capsule (10 mg total) by mouth 3 (three) times daily as needed for spasms. 02/20/23   Tilden Fossa, MD  dicyclomine (BENTYL) 20 MG tablet Take 1 tablet (20 mg total) by mouth 2 (two) times daily. 03/04/23   Roxy Horseman, PA-C  Emollient (UDDERLY SMOOTH) CREA Apply 1 application topically See admin instructions. Apply topically to skin folds as needed for rash/irritation    [provider]  famotidine (PEPCID) 10 MG tablet Take 10 mg by mouth daily as needed for heartburn or indigestion.    [provider]  fenofibrate 160 MG tablet TAKE 1 TABLET BY MOUTH EVERY DAY 03/04/23   Carlean Jews, NP  ferrous sulfate 325 (65 FE) MG tablet TAKE 1 TABLET BY MOUTH TWICE A DAY Patient taking differently: Take 325 mg by mouth daily. 08/03/22   Zehr, Princella Pellegrini, PA-C  gabapentin (NEURONTIN) 300 MG capsule Take 1 capsule (300 mg total) by mouth at bedtime. 02/23/23   Gloris Manchester, MD  isosorbide mononitrate (IMDUR) 30 MG 24 hr tablet Take 30 mg by mouth daily. 11/30/22   [provider]  olmesartan (BENICAR) 5 MG tablet TAKE 1 TABLET (5 MG TOTAL) BY MOUTH DAILY. 02/05/23  Carlean Jews, NP  ondansetron (ZOFRAN) 4 MG tablet Take 1 tablet (4 mg total) by mouth every 6 (six) hours. 02/20/23   Loetta Rough, MD  pantoprazole (PROTONIX) 40 MG tablet Take 40 mg by mouth daily. 12/26/22   [provider]  rosuvastatin (CRESTOR) 40 MG tablet TAKE 1 TABLET BY MOUTH EVERY DAY 01/04/23   Rollene Rotunda, MD      Allergies    Other and Penicillins    Review of Systems   Review of Systems  Gastrointestinal:  Positive for abdominal pain.    Physical Exam Updated Vital Signs BP (!) 170/73 (BP Location: Left Arm)   Pulse (!) 59   Temp 98 F (36.7 C) (Oral)   Resp 18   Ht 1.676 m (5\' 6" )   Wt 86.6 kg   SpO2 100%   BMI 30.83 kg/m   Physical Exam Vitals and nursing note reviewed.  Constitutional:      Appearance: She is well-developed. She is not diaphoretic.  HENT:     Head: Normocephalic and atraumatic.     Right Ear: External ear normal.     Left Ear: External ear normal.  Eyes:     General: No scleral icterus.       Right eye: No discharge.        Left eye: No discharge.     Conjunctiva/sclera: Conjunctivae normal.  Neck:     Trachea: No tracheal deviation.  Cardiovascular:     Rate and Rhythm: Normal rate and regular rhythm.  Pulmonary:     Effort: Pulmonary effort is normal. No respiratory distress.     Breath sounds: Normal breath sounds. No stridor. No wheezing or rales.  Abdominal:     General: Bowel sounds are normal. There is no distension.     Palpations: Abdomen is soft.     Tenderness: There is no abdominal tenderness. There is no guarding or rebound.  Musculoskeletal:        General: No tenderness or deformity.     Cervical back: Neck supple.  Skin:    General: Skin is warm and dry.     Findings: No rash.  Neurological:     General: No focal deficit present.     Mental Status: She is alert.     Cranial Nerves: No cranial nerve deficit, dysarthria or facial asymmetry.     Sensory: No sensory deficit.     Motor: No abnormal muscle tone or seizure activity.     Coordination: Coordination normal.  Psychiatric:     Comments: Depressed mood     ED Results / Procedures / Treatments   Labs (all labs ordered are listed, but only abnormal results are displayed) Labs Reviewed  LIPASE, BLOOD - Abnormal; Notable for the following components:      Result Value   Lipase 53 (*)    All other components within normal limits  COMPREHENSIVE METABOLIC PANEL - Abnormal; Notable for the following components:   Glucose, Bld 115 (*)    Creatinine, Ser 1.24 (*)    Total Protein 6.1 (*)    GFR, Estimated 47 (*)    All other components within normal limits  CBC  URINALYSIS, ROUTINE W REFLEX  MICROSCOPIC    EKG None  Radiology No results found.  Procedures Procedures    Medications Ordered in ED Medications  dicyclomine (BENTYL) capsule 10 mg (10 mg Oral Given 03/05/23 2041)  sodium chloride 0.9 % bolus 500 mL (0 mLs Intravenous Stopped 03/05/23  2141)    ED Course/ Medical Decision Making/ A&P                             Medical Decision Making Problems Addressed: Abdominal pain, unspecified abdominal location: chronic illness or injury with exacerbation, progression, or side effects of treatment  Amount and/or Complexity of Data Reviewed Labs: ordered. Decision-making details documented in ED Course.  Risk Prescription drug management.   She presented to the ED for evaluation of abdominal pain.  Patient has had multiple ED visits for this condition.  She has been evaluated multiple times and has had multiple CT scans.  Patient's ED workup is reassuring.  Her laboratory tests are unremarkable.  No signs to suggest acute infection.  No anemia.  No signs of severe dehydration.  Patient's had multiple CT scans 3 in the last week or 2.  I do not feel that repeat CT scan imaging is necessary.  Patient was given a prescription for Bentyl that she did not pick up.  She was given a dose of that here in the ED with IV fluids with some improvement.  Encouraged outpatient follow-up with her gastroenterologist.        Final Clinical Impression(s) / ED Diagnoses Final diagnoses:  Abdominal pain, unspecified abdominal location    Rx / DC Orders ED Discharge Orders     None         Linwood Dibbles, MD 03/05/23 2303

## 2023-03-05 NOTE — Discharge Instructions (Signed)
Take the Bentyl medication that you were prescribed.  Follow-up with your GI doctor for further evaluation as we discussed.

## 2023-03-07 ENCOUNTER — Emergency Department (HOSPITAL_COMMUNITY)
Admission: EM | Admit: 2023-03-07 | Discharge: 2023-03-07 | Discharge status: Against Medical Advice | Payer: BC Managed Care – PPO | Attending: Emergency Medicine | Admitting: Emergency Medicine

## 2023-03-07 ENCOUNTER — Emergency Department (HOSPITAL_COMMUNITY): Payer: BC Managed Care – PPO

## 2023-03-07 ENCOUNTER — Other Ambulatory Visit: Payer: Self-pay

## 2023-03-07 ENCOUNTER — Encounter (HOSPITAL_COMMUNITY): Payer: Self-pay | Admitting: *Deleted

## 2023-03-07 DIAGNOSIS — R109 Unspecified abdominal pain: Secondary | ICD-10-CM | POA: Diagnosis present

## 2023-03-07 DIAGNOSIS — Z96653 Presence of artificial knee joint, bilateral: Secondary | ICD-10-CM | POA: Insufficient documentation

## 2023-03-07 DIAGNOSIS — G8929 Other chronic pain: Secondary | ICD-10-CM

## 2023-03-07 DIAGNOSIS — I10 Essential (primary) hypertension: Secondary | ICD-10-CM | POA: Insufficient documentation

## 2023-03-07 DIAGNOSIS — E119 Type 2 diabetes mellitus without complications: Secondary | ICD-10-CM | POA: Diagnosis not present

## 2023-03-07 DIAGNOSIS — R1084 Generalized abdominal pain: Secondary | ICD-10-CM | POA: Diagnosis not present

## 2023-03-07 DIAGNOSIS — Z5329 Procedure and treatment not carried out because of patient's decision for other reasons: Secondary | ICD-10-CM | POA: Diagnosis not present

## 2023-03-07 LAB — COMPREHENSIVE METABOLIC PANEL
ALT: 12 U/L (ref 0–44)
AST: 18 U/L (ref 15–41)
Albumin: 3.5 g/dL (ref 3.5–5.0)
Alkaline Phosphatase: 44 U/L (ref 38–126)
Anion gap: 9 (ref 5–15)
BUN: 15 mg/dL (ref 8–23)
CO2: 21 mmol/L — ABNORMAL LOW (ref 22–32)
Calcium: 9.2 mg/dL (ref 8.9–10.3)
Chloride: 107 mmol/L (ref 98–111)
Creatinine, Ser: 1.03 mg/dL — ABNORMAL HIGH (ref 0.44–1.00)
GFR, Estimated: 58 mL/min — ABNORMAL LOW (ref >60)
Glucose, Bld: 123 mg/dL — ABNORMAL HIGH (ref 70–99)
Potassium: 3.8 mmol/L (ref 3.5–5.1)
Sodium: 137 mmol/L (ref 135–145)
Total Bilirubin: 0.5 mg/dL (ref 0.3–1.2)
Total Protein: 5.9 g/dL — ABNORMAL LOW (ref 6.5–8.1)

## 2023-03-07 LAB — CBC
HCT: 36 % (ref 36.0–46.0)
Hemoglobin: 12.1 g/dL (ref 12.0–15.0)
MCH: 32.7 pg (ref 26.0–34.0)
MCHC: 33.6 g/dL (ref 30.0–36.0)
MCV: 97.3 fL (ref 80.0–100.0)
Platelets: 159 10*3/uL (ref 150–400)
RBC: 3.7 MIL/uL — ABNORMAL LOW (ref 3.87–5.11)
RDW: 12.4 % (ref 11.5–15.5)
WBC: 5.5 10*3/uL (ref 4.0–10.5)
nRBC: 0 % (ref 0.0–0.2)

## 2023-03-07 LAB — URINALYSIS, ROUTINE W REFLEX MICROSCOPIC
Bilirubin Urine: NEGATIVE
Glucose, UA: NEGATIVE mg/dL
Hgb urine dipstick: NEGATIVE
Ketones, ur: NEGATIVE mg/dL
Leukocytes,Ua: NEGATIVE
Nitrite: NEGATIVE
Protein, ur: NEGATIVE mg/dL
Specific Gravity, Urine: 1.01 (ref 1.005–1.030)
pH: 7 (ref 5.0–8.0)

## 2023-03-07 LAB — LIPASE, BLOOD: Lipase: 61 U/L — ABNORMAL HIGH (ref 11–51)

## 2023-03-07 MED ORDER — DICYCLOMINE HCL 10 MG PO CAPS
10.0000 mg | ORAL_CAPSULE | Freq: Once | ORAL | Status: AC
  Administered 2023-03-07: 10 mg via ORAL
  Filled 2023-03-07: qty 1

## 2023-03-07 MED ORDER — LACTATED RINGERS IV BOLUS
1000.0000 mL | Freq: Once | INTRAVENOUS | Status: AC
  Administered 2023-03-07: 1000 mL via INTRAVENOUS

## 2023-03-07 MED ORDER — FAMOTIDINE 20 MG PO TABS
20.0000 mg | ORAL_TABLET | Freq: Once | ORAL | Status: AC
  Administered 2023-03-07: 20 mg via ORAL
  Filled 2023-03-07: qty 1

## 2023-03-07 MED ORDER — DROPERIDOL 2.5 MG/ML IJ SOLN
1.2500 mg | Freq: Once | INTRAMUSCULAR | Status: AC
  Administered 2023-03-07: 1.25 mg via INTRAVENOUS
  Filled 2023-03-07: qty 2

## 2023-03-07 NOTE — ED Provider Notes (Signed)
[place] EMERGENCY DEPARTMENT AT [facility] Provider Note   HPI: Rachel Black is a 72 year old female with a past medical history as below presenting today with abdominal pain.  The patient reports she has had this pain for several months now.  She reports she has had multiple emergency department visits for the pain.  She is supposed to follow-up with gastroenterology but does not have an appointment until July 1.  She reports the pain has persisted therefore she presented for reevaluation.  She denies nausea, vomiting, diarrhea.  She denies vaginal bleeding or vaginal discharge changes.  She denies dysuria, urinary frequency, urinary urgency.  She reports generalized pain all over.  She reports a constant dull pain.  She does not have diarrhea and does not have constipation.  Past Medical History:  Diagnosis Date   Arthritis    PAIN AND OA BOTH KNEES AND SHOULDERS AND ELBOWS AND WRIST   Blood transfusion without reported diagnosis 2018   after cholecystectomy   Broken foot, right, closed, initial encounter 09/15/2021   no surgery needed, fell at home and went to ED   Diabetes mellitus    ORAL MEDICATION   Diverticulitis    GERD (gastroesophageal reflux disease)    Gout    NO RECENT FLARE UPS   H/O: rheumatic fever    AS A CHILD - NO KNOWN HEART MURMUR OR HEART PROBLEMS   Heart attack (HCC)    Hyperlipidemia    Hypertension    PFO (patent foramen ovale)    Stroke (HCC) 03/2017    Past Surgical History:  Procedure Laterality Date   ABDOMINAL HYSTERECTOMY     CESAREAN SECTION     CHOLECYSTECTOMY N/A 12/09/2016   Procedure: LAPAROSCOPIC CHOLECYSTECTOMY WITH INTRAOPERATIVE CHOLANGIOGRAM;  Surgeon: Almond Lint, MD;  Location: [facility] ORS;  Service: General;  Laterality: N/A;   COLONOSCOPY     COLONOSCOPY WITH PROPOFOL N/A 01/30/2013   Procedure: COLONOSCOPY WITH PROPOFOL;  Surgeon: Hilarie Fredrickson, MD;  Location: [facility] ENDOSCOPY;  Service: Endoscopy;  Laterality: N/A;    ERCP N/A 12/08/2016   Procedure: ENDOSCOPIC RETROGRADE CHOLANGIOPANCREATOGRAPHY (ERCP);  Surgeon: Meryl Dare, MD;  Location: [facility];  Service: Endoscopy;  Laterality: N/A;   ESOPHAGOGASTRODUODENOSCOPY (EGD) WITH PROPOFOL N/A 01/30/2013   Procedure: ESOPHAGOGASTRODUODENOSCOPY (EGD) WITH PROPOFOL;  Surgeon: Hilarie Fredrickson, MD;  Location: [facility] ENDOSCOPY;  Service: Endoscopy;  Laterality: N/A;   LEFT HEART CATH AND CORONARY ANGIOGRAPHY N/A 10/18/2017   Procedure: LEFT HEART CATH AND CORONARY ANGIOGRAPHY;  Surgeon: [place], Peter M, MD;  Location: [facility] INVASIVE CV LAB;  Service: Cardiovascular;  Laterality: N/A;   LOOP RECORDER INSERTION N/A 03/18/2017   Procedure: Loop Recorder Insertion;  Surgeon: Regan Lemming, MD;  Location: [facility] INVASIVE CV LAB;  Service: Cardiovascular;  Laterality: N/A;   PARTIAL HYSTERECTOMY     TEE WITHOUT CARDIOVERSION N/A 03/16/2017   Procedure: TRANSESOPHAGEAL ECHOCARDIOGRAM (TEE);  Surgeon: Elease Hashimoto Deloris Ping, MD;  Location: [facility] ENDOSCOPY;  Service: Cardiovascular;  Laterality: N/A;   TOTAL KNEE ARTHROPLASTY Right 04/20/2014   Procedure: RIGHT TOTAL KNEE ARTHROPLASTY CONVERTED TO RIGHT KNEE REIMPLANTATION;  Surgeon: Kathryne Hitch, MD;  Location: [facility] ORS;  Service: Orthopedics;  Laterality: Right;   TOTAL KNEE ARTHROPLASTY Left 08/23/2015   Procedure: LEFT TOTAL KNEE ARTHROPLASTY;  Surgeon: Kathryne Hitch, MD;  Location: [facility];  Service: Orthopedics;  Laterality: Left;   VENTRAL HERNIA REPAIR     x2     Social History   Tobacco Use  Smoking status: Never    Passive exposure: Never   Smokeless tobacco: Never  Vaping Use   Vaping Use: Never used  Substance Use Topics   Alcohol use: Not Currently    Comment: rarely in the past   Drug use: Never      Review of Systems  A complete ROS was performed with pertinent positives/negatives noted in the HPI.   Vitals:   03/07/23 1637  BP: 128/81  Pulse: 95  Resp: 16  Temp: 98.6 F (37 C)   SpO2: 95%    Physical Exam Vitals and nursing note reviewed.  Constitutional:      General: She is not in acute distress.    Appearance: She is well-developed. She is not ill-appearing.  Cardiovascular:     Rate and Rhythm: Normal rate and regular rhythm.     Heart sounds: No murmur heard. Pulmonary:     Effort: Pulmonary effort is normal. No respiratory distress.     Breath sounds: Normal breath sounds. No stridor. No wheezing, rhonchi or rales.  Abdominal:     General: Abdomen is flat. There is no distension.     Palpations: Abdomen is soft.     Tenderness: There is generalized abdominal tenderness. There is no right CVA tenderness, left CVA tenderness, guarding or rebound.  Musculoskeletal:        General: No swelling.  Skin:    General: Skin is warm and dry.     Capillary Refill: Capillary refill takes less than 2 seconds.  Neurological:     Mental Status: She is alert.  Psychiatric:        Mood and Affect: Mood normal.     Procedures  MDM:   Lab results:  Results for orders placed or performed during the hospital encounter of 03/07/23 (from the past 24 hour(s))  Lipase, blood     Status: Abnormal   Collection Time: 03/07/23  5:02 PM  Result Value Ref Range   Lipase 61 (H) 11 - 51 U/L  Comprehensive metabolic panel     Status: Abnormal   Collection Time: 03/07/23  5:02 PM  Result Value Ref Range   Sodium 137 135 - 145 mmol/L   Potassium 3.8 3.5 - 5.1 mmol/L   Chloride 107 98 - 111 mmol/L   CO2 21 (L) 22 - 32 mmol/L   Glucose, Bld 123 (H) 70 - 99 mg/dL   BUN 15 8 - 23 mg/dL   Creatinine, Ser 9.60 (H) 0.44 - 1.00 mg/dL   Calcium 9.2 8.9 - 45.4 mg/dL   Total Protein 5.9 (L) 6.5 - 8.1 g/dL   Albumin 3.5 3.5 - 5.0 g/dL   AST 18 15 - 41 U/L   ALT 12 0 - 44 U/L   Alkaline Phosphatase 44 38 - 126 U/L   Total Bilirubin 0.5 0.3 - 1.2 mg/dL   GFR, Estimated 58 (L) >60 mL/min   Anion gap 9 5 - 15  CBC     Status: Abnormal   Collection Time: 03/07/23  5:02 PM   Result Value Ref Range   WBC 5.5 4.0 - 10.5 K/uL   RBC 3.70 (L) 3.87 - 5.11 MIL/uL   Hemoglobin 12.1 12.0 - 15.0 g/dL   HCT 09.8 11.9 - 14.7 %   MCV 97.3 80.0 - 100.0 fL   MCH 32.7 26.0 - 34.0 pg   MCHC 33.6 30.0 - 36.0 g/dL   RDW 82.9 56.2 - 13.0 %   Platelets 159 150 - 400 K/uL  nRBC 0.0 0.0 - 0.2 %  Urinalysis, Routine w reflex microscopic -Urine, Clean Catch     Status: Abnormal   Collection Time: 03/07/23  6:53 PM  Result Value Ref Range   Color, Urine STRAW (A) YELLOW   APPearance CLEAR CLEAR   Specific Gravity, Urine 1.010 1.005 - 1.030   pH 7.0 5.0 - 8.0   Glucose, UA NEGATIVE NEGATIVE mg/dL   Hgb urine dipstick NEGATIVE NEGATIVE   Bilirubin Urine NEGATIVE NEGATIVE   Ketones, ur NEGATIVE NEGATIVE mg/dL   Protein, ur NEGATIVE NEGATIVE mg/dL   Nitrite NEGATIVE NEGATIVE   Leukocytes,Ua NEGATIVE NEGATIVE     Key medications administered in the ER:  Medications  lactated ringers bolus 1,000 mL (1,000 mLs Intravenous New Bag/Given 03/07/23 1852)  dicyclomine (BENTYL) capsule 10 mg (10 mg Oral Given 03/07/23 1848)  droperidol (INAPSINE) 2.5 MG/ML injection 1.25 mg (1.25 mg Intravenous Given 03/07/23 1842)  famotidine (PEPCID) tablet 20 mg (20 mg Oral Given 03/07/23 1850)   Medical decision making: -Vital signs stable. Patient afebrile, hemodynamically stable, and non-toxic appearing. -Patient's presentation is most consistent with acute complicated illness / injury requiring diagnostic workup.. DEONI KOPINSKI is a 72 y.o. female presenting to the emergency department with abdominal pain..  -Additional history obtained from patient's husband at bedside. -Per chart review, the patient has had CT scans of her abdomen pelvis on the 14th ,16th, and 18th of this month. Patient also had a CT angiogram on the eighth of this month and she had several CT scans in May.  She has had multiple emergency department visits in the last 6 months most recently on 03/05/2023 where she had a  reassuring workup before being discharged. -Emergent causes of abdominal pain considered. Doubt appendicitis given no consistent history or physical exam findings, afebrile, and no leukocytosis. No bowel symptoms consistent with inflammatory bowel disease present. No evidence of blood or bacteria in urine for nephrolithiasis versus pyelonephritis. No physical exam findings concern for hernia or torsion.  Mesenteric ischemia considered; however, less likely given pain is not out of proportion with physical exam.  Gallbladder/pancreatic etiology considered, with normal hepatic function panel, lipase only slightly above baseline and patient does not have epigastric pain rating to the back.  Bowel obstruction considered, but unlikely given history, exam with patient not having nausea/vomiting and having bowel movements. ACS considered, less likely given no chest pain and no emergent EKG changes. Given that patient is female, ovarian torsion, ectopic pregnancy, PID versus STI considered; however, considered less likely given patient postmenopausal, no new vaginal discharge or bleeding, and no pelvic pain.  Patient given fluids, Bentyl, droperidol, Pepcid.  Upon reassessment patient reports improvement in pain but persistence.  Discussed pros and cons of obtaining CT imaging and ultimately decided to pursue CT imaging.  Shortly thereafter, patient reports she would like to leave the ED. We discussed the nature and purpose, risks and benefits, as well as, the alternatives of obtaining CT imaging. Time was given to allow the opportunity to ask questions and consider their options, and after the discussion, the patient decided to refuse the offerred treatment. The patient was informed that refusal could lead to, but was not limited to, death, permanent disability, or severe pain. Prior to refusing, I determined that the patient had the capacity to make their decision and understood the consequences of that decision. After  refusal, I made every reasonable opportunity to treat them to the best of my ability.     Medical Decision Making  Amount and/or Complexity of Data Reviewed Labs: ordered. Decision-making details documented in ED Course. Radiology: ordered.  Risk Prescription drug management.     The plan for this patient was discussed with Dr. Wallace Cullens, who voiced agreement and who oversaw evaluation and treatment of this patient.  Marta Lamas, MD Emergency Medicine, PGY-3  Note: Dragon medical dictation software was used in the creation of this note.   Clinical Impression:  1. Chronic abdominal pain     Rx / DC Orders ED Discharge Orders     None          Chase Caller, MD 03/07/23 2104    Sloan Leiter, DO 03/09/23 0049    Sloan Leiter, DO 03/15/23 0752    Sloan Leiter, DO 03/20/23 [phone]

## 2023-03-07 NOTE — ED Notes (Signed)
Patient requested to leave AMA understanding the risks associated with doing so. ED provider aware.

## 2023-03-07 NOTE — ED Notes (Signed)
Pt attempted urine collection and could not. Pt still has urine cup.

## 2023-03-07 NOTE — Discharge Instructions (Signed)
It was a pleasure caring for you today in the emergency department. ° °Please return to the emergency department for any worsening or worrisome symptoms. ° ° °

## 2023-03-07 NOTE — ED Triage Notes (Signed)
The pt is c/o abd pain  for a long time  she frequents the ed for the same

## 2023-03-08 ENCOUNTER — Ambulatory Visit: Payer: BC Managed Care – PPO | Admitting: Internal Medicine

## 2023-03-08 ENCOUNTER — Ambulatory Visit: Payer: BC Managed Care – PPO | Admitting: Family Medicine

## 2023-03-10 ENCOUNTER — Ambulatory Visit: Payer: BC Managed Care – PPO | Admitting: Family Medicine

## 2023-03-12 ENCOUNTER — Emergency Department (HOSPITAL_COMMUNITY): Payer: BC Managed Care – PPO

## 2023-03-12 ENCOUNTER — Emergency Department (HOSPITAL_COMMUNITY)
Admission: EM | Admit: 2023-03-12 | Discharge: 2023-03-12 | Disposition: A | Discharge status: Home / Self Care | Payer: BC Managed Care – PPO | Attending: Emergency Medicine | Admitting: Emergency Medicine

## 2023-03-12 ENCOUNTER — Encounter (HOSPITAL_COMMUNITY): Payer: Self-pay | Admitting: *Deleted

## 2023-03-12 ENCOUNTER — Other Ambulatory Visit: Payer: Self-pay

## 2023-03-12 DIAGNOSIS — R1084 Generalized abdominal pain: Secondary | ICD-10-CM | POA: Insufficient documentation

## 2023-03-12 DIAGNOSIS — E119 Type 2 diabetes mellitus without complications: Secondary | ICD-10-CM | POA: Insufficient documentation

## 2023-03-12 DIAGNOSIS — Z7901 Long term (current) use of anticoagulants: Secondary | ICD-10-CM | POA: Diagnosis not present

## 2023-03-12 DIAGNOSIS — Z8673 Personal history of transient ischemic attack (TIA), and cerebral infarction without residual deficits: Secondary | ICD-10-CM | POA: Insufficient documentation

## 2023-03-12 LAB — COMPREHENSIVE METABOLIC PANEL
ALT: 13 U/L (ref 0–44)
AST: 17 U/L (ref 15–41)
Albumin: 3.8 g/dL (ref 3.5–5.0)
Alkaline Phosphatase: 45 U/L (ref 38–126)
Anion gap: 9 (ref 5–15)
BUN: 17 mg/dL (ref 8–23)
CO2: 24 mmol/L (ref 22–32)
Calcium: 9.7 mg/dL (ref 8.9–10.3)
Chloride: 104 mmol/L (ref 98–111)
Creatinine, Ser: 1.15 mg/dL — ABNORMAL HIGH (ref 0.44–1.00)
GFR, Estimated: 51 mL/min — ABNORMAL LOW (ref >60)
Glucose, Bld: 104 mg/dL — ABNORMAL HIGH (ref 70–99)
Potassium: 4.6 mmol/L (ref 3.5–5.1)
Sodium: 137 mmol/L (ref 135–145)
Total Bilirubin: 0.4 mg/dL (ref 0.3–1.2)
Total Protein: 6.2 g/dL — ABNORMAL LOW (ref 6.5–8.1)

## 2023-03-12 LAB — CBC WITH DIFFERENTIAL/PLATELET
Abs Immature Granulocytes: 0.02 10*3/uL (ref 0.00–0.07)
Basophils Absolute: 0 10*3/uL (ref 0.0–0.1)
Basophils Relative: 1 %
Eosinophils Absolute: 0 10*3/uL (ref 0.0–0.5)
Eosinophils Relative: 0 %
HCT: 38.2 % (ref 36.0–46.0)
Hemoglobin: 12.3 g/dL (ref 12.0–15.0)
Immature Granulocytes: 0 %
Lymphocytes Relative: 33 %
Lymphs Abs: 2.2 10*3/uL (ref 0.7–4.0)
MCH: 31.1 pg (ref 26.0–34.0)
MCHC: 32.2 g/dL (ref 30.0–36.0)
MCV: 96.5 fL (ref 80.0–100.0)
Monocytes Absolute: 0.3 10*3/uL (ref 0.1–1.0)
Monocytes Relative: 5 %
Neutro Abs: 4 10*3/uL (ref 1.7–7.7)
Neutrophils Relative %: 61 %
Platelets: 172 10*3/uL (ref 150–400)
RBC: 3.96 MIL/uL (ref 3.87–5.11)
RDW: 12.5 % (ref 11.5–15.5)
WBC: 6.6 10*3/uL (ref 4.0–10.5)
nRBC: 0 % (ref 0.0–0.2)

## 2023-03-12 LAB — LIPASE, BLOOD: Lipase: 49 U/L (ref 11–51)

## 2023-03-12 LAB — LACTIC ACID, PLASMA: Lactic Acid, Venous: 0.7 mmol/L (ref 0.5–1.9)

## 2023-03-12 MED ORDER — MORPHINE SULFATE (PF) 4 MG/ML IV SOLN
4.0000 mg | Freq: Once | INTRAVENOUS | Status: AC
  Administered 2023-03-12: 4 mg via INTRAVENOUS
  Filled 2023-03-12: qty 1

## 2023-03-12 MED ORDER — LIDOCAINE VISCOUS HCL 2 % MT SOLN
15.0000 mL | Freq: Once | OROMUCOSAL | Status: AC
  Administered 2023-03-12: 15 mL via ORAL
  Filled 2023-03-12: qty 15

## 2023-03-12 MED ORDER — ALUM & MAG HYDROXIDE-SIMETH 200-200-20 MG/5ML PO SUSP
30.0000 mL | Freq: Once | ORAL | Status: AC
  Administered 2023-03-12: 30 mL via ORAL
  Filled 2023-03-12: qty 30

## 2023-03-12 MED ORDER — IOHEXOL 350 MG/ML SOLN
75.0000 mL | Freq: Once | INTRAVENOUS | Status: AC | PRN
  Administered 2023-03-12: 75 mL via INTRAVENOUS

## 2023-03-12 NOTE — Discharge Instructions (Addendum)
Your history, exam, workup today did not reveal a clear cause of your recurrent abdominal discomfort today.  There is no evidence of obstruction, abscess, perforation, or diverticulitis at this time.  We feel you are safe for discharge home given your stability for over 6 hours here and improvement in symptoms.  Please follow-up with the GI team on Monday and your primary doctor.  If any symptoms change or worsen acutely, please return to the nearest emergency department.

## 2023-03-12 NOTE — ED Provider Notes (Signed)
[place] EMERGENCY DEPARTMENT AT [facility] Provider Note   CSN: [id number] Arrival date & time: 03/12/23  1627     History  Chief Complaint  Patient presents with   Abdominal Pain    Rachel Black is a 72 y.o. female.  The history is provided by the patient, medical records and the spouse. No language interpreter was used.  Abdominal Pain Pain location:  Generalized Pain quality: aching and dull   Pain radiates to:  Does not radiate Pain severity:  Severe Onset quality:  Gradual Duration:  5 weeks Timing:  Constant Progression:  Waxing and waning Chronicity:  Recurrent Context: not trauma   Relieved by:  Nothing Worsened by:  Palpation Ineffective treatments:  None tried Associated symptoms: no chest pain, no chills, no constipation (resolved), no cough, no diarrhea, no dysuria, no fatigue, no fever, no nausea, no shortness of breath and no vomiting        Home Medications Prior to Admission medications   Medication Sig Start Date End Date Taking? Authorizing Provider  ACCU-CHEK AVIVA PLUS test strip USE AS INSTRUCTED ONCE A DAY. DX CODE E11.9 07/20/22   Mayer Masker, PA-C  Accu-Chek Softclix Lancets lancets Use as instructed once a day.  DX Code: E11.9 03/24/22   Mayer Masker, PA-C  acetaminophen (TYLENOL) 500 MG tablet Take 1,000 mg by mouth every 6 (six) hours as needed for mild pain or moderate pain.    [provider]  allopurinol (ZYLOPRIM) 300 MG tablet TAKE 1 TABLET BY MOUTH TWICE A DAY 03/04/23   Carlean Jews, NP  apixaban (ELIQUIS) 5 MG TABS tablet Take 1 tablet (5 mg total) by mouth 2 (two) times daily. 02/09/23   Saralyn Pilar A, PA  CALCIUM PO Take 2 tablets by mouth daily.    [provider]  dicyclomine (BENTYL) 10 MG capsule Take 1 capsule (10 mg total) by mouth 3 (three) times daily as needed for spasms. 02/20/23   Tilden Fossa, MD  dicyclomine (BENTYL) 20 MG tablet Take 1 tablet (20 mg total) by mouth 2 (two)  times daily. 03/04/23   Roxy Horseman, PA-C  Emollient (UDDERLY SMOOTH) CREA Apply 1 application topically See admin instructions. Apply topically to skin folds as needed for rash/irritation    [provider]  famotidine (PEPCID) 10 MG tablet Take 10 mg by mouth daily as needed for heartburn or indigestion.    [provider]  fenofibrate 160 MG tablet TAKE 1 TABLET BY MOUTH EVERY DAY 03/04/23   Carlean Jews, NP  ferrous sulfate 325 (65 FE) MG tablet TAKE 1 TABLET BY MOUTH TWICE A DAY Patient taking differently: Take 325 mg by mouth daily. 08/03/22   Zehr, Princella Pellegrini, PA-C  gabapentin (NEURONTIN) 300 MG capsule Take 1 capsule (300 mg total) by mouth at bedtime. 02/23/23   Gloris Manchester, MD  isosorbide mononitrate (IMDUR) 30 MG 24 hr tablet Take 30 mg by mouth daily. 11/30/22   [provider]  olmesartan (BENICAR) 5 MG tablet TAKE 1 TABLET (5 MG TOTAL) BY MOUTH DAILY. 02/05/23   Carlean Jews, NP  ondansetron (ZOFRAN) 4 MG tablet Take 1 tablet (4 mg total) by mouth every 6 (six) hours. 02/20/23   Loetta Rough, MD  pantoprazole (PROTONIX) 40 MG tablet Take 40 mg by mouth daily. 12/26/22   [provider]  rosuvastatin (CRESTOR) 40 MG tablet TAKE 1 TABLET BY MOUTH EVERY DAY 01/04/23   Rollene Rotunda, MD  Allergies    Other and Penicillins    Review of Systems   Review of Systems  Constitutional:  Negative for chills, diaphoresis, fatigue and fever.  HENT:  Negative for congestion.   Respiratory:  Negative for cough, chest tightness and shortness of breath.   Cardiovascular:  Negative for chest pain.  Gastrointestinal:  Positive for abdominal pain. Negative for constipation (resolved), diarrhea, nausea and vomiting.  Genitourinary:  Negative for dysuria, flank pain and frequency.  Musculoskeletal:  Negative for back pain, neck pain and neck stiffness.  Skin:  Negative for rash and wound.  Neurological:  Negative for light-headedness and headaches.   All other systems reviewed and are negative.   Physical Exam Updated Vital Signs BP 109/80   Pulse 81   Temp 98.6 F (37 C)   Resp 16   Wt 86.2 kg   SpO2 98%   BMI 30.67 kg/m  Physical Exam Vitals and nursing note reviewed.  Constitutional:      General: She is not in acute distress.    Appearance: She is well-developed. She is not ill-appearing, toxic-appearing or diaphoretic.  HENT:     Head: Normocephalic and atraumatic.  Eyes:     Conjunctiva/sclera: Conjunctivae normal.  Cardiovascular:     Rate and Rhythm: Normal rate and regular rhythm.     Heart sounds: No murmur heard. Pulmonary:     Effort: Pulmonary effort is normal. No respiratory distress.     Breath sounds: Normal breath sounds. No wheezing, rhonchi or rales.  Chest:     Chest wall: No tenderness.  Abdominal:     General: Abdomen is flat. Bowel sounds are normal. There is no distension.     Palpations: Abdomen is soft.     Tenderness: There is abdominal tenderness. There is no right CVA tenderness, left CVA tenderness, guarding or rebound.  Musculoskeletal:        General: No swelling.     Cervical back: Neck supple.  Skin:    General: Skin is warm and dry.     Capillary Refill: Capillary refill takes less than 2 seconds.  Neurological:     Mental Status: She is alert.  Psychiatric:        Mood and Affect: Mood normal.     ED Results / Procedures / Treatments   Labs (all labs ordered are listed, but only abnormal results are displayed) Labs Reviewed  COMPREHENSIVE METABOLIC PANEL - Abnormal; Notable for the following components:      Result Value   Glucose, Bld 104 (*)    Creatinine, Ser 1.15 (*)    Total Protein 6.2 (*)    GFR, Estimated 51 (*)    All other components within normal limits  LIPASE, BLOOD  CBC WITH DIFFERENTIAL/PLATELET  LACTIC ACID, PLASMA  URINALYSIS, ROUTINE W REFLEX MICROSCOPIC  LACTIC ACID, PLASMA    EKG None  Radiology CT ABDOMEN PELVIS W CONTRAST  Result  Date: 03/12/2023 CLINICAL DATA:  Abdominal pain EXAM: CT ABDOMEN AND PELVIS WITH CONTRAST TECHNIQUE: Multidetector CT imaging of the abdomen and pelvis was performed using the standard protocol following bolus administration of intravenous contrast. RADIATION DOSE REDUCTION: This exam was performed according to the departmental dose-optimization program which includes automated exposure control, adjustment of the mA and/or kV according to patient size and/or use of iterative reconstruction technique. CONTRAST:  75mL OMNIPAQUE IOHEXOL 350 MG/ML SOLN COMPARISON:  Previous studies including the examination of 03/02/2023 FINDINGS: Lower chest: Left hemidiaphragm is elevated. Visualized lower lung fields are  clear. Hepatobiliary: Surgical clips are seen in gallbladder fossa. There is air in the lumen of intrahepatic bile ducts, possibly suggesting previous sphincterotomy. Distal common bile duct in the head of the pancreas measures 7 mm. Pancreas: No focal abnormalities are seen. Spleen: Unremarkable. Adrenals/Urinary Tract: Adrenals are unremarkable. There is no hydronephrosis. There are no renal or ureteral stones. There is 1.6 cm smooth marginated fluid density lesion in the lateral margin of left kidney suggesting renal cyst. Urinary bladder is unremarkable. Stomach/Bowel: Small hiatal hernia is seen. There is moderate gaseous distention of stomach. Small bowel loops are not dilated. Appendix is not seen. Cecum is lower than usual in position. There is no pericecal inflammation. There is no significant wall thickening in colon. Scattered diverticula are seen without signs of focal diverticulitis. Vascular/Lymphatic: Scattered calcifications are seen in the femoral arteries. Reproductive: Uterus is not seen. Other: There is no ascites or pneumoperitoneum. Small ventral hernia containing fat is seen in epigastrium. Musculoskeletal: There is first-degree anterolisthesis at L3-L4 level. Spinal stenosis and encroachment  of neural foramina is noted from L3-S1 levels. IMPRESSION: There is no evidence of intestinal obstruction or pneumoperitoneum. There is no hydronephrosis. Small hiatal hernia. Diverticulosis of colon without signs of diverticulitis. Left renal cyst. Lumbar spondylosis. Electronically Signed   By: Ernie Avena M.D.   On: 03/12/2023 19:50    Procedures Procedures    Medications Ordered in ED Medications  alum & mag hydroxide-simeth (MAALOX/MYLANTA) 200-200-20 MG/5ML suspension 30 mL (30 mLs Oral Given 03/12/23 1956)    And  lidocaine (XYLOCAINE) 2 % viscous mouth solution 15 mL (15 mLs Oral Given 03/12/23 1956)  morphine (PF) 4 MG/ML injection 4 mg (4 mg Intravenous Given 03/12/23 2033)  iohexol (OMNIPAQUE) 350 MG/ML injection 75 mL (75 mLs Intravenous Contrast Given 03/12/23 1928)    ED Course/ Medical Decision Making/ A&P                             Medical Decision Making Amount and/or Complexity of Data Reviewed Labs: ordered. Radiology: ordered.  Risk OTC drugs. Prescription drug management.    MAGIN MICHON is a 72 y.o. female with a past medical history significant for diabetes, gout, previous stroke, previous NSTEMI, previous diverticulitis, and history of cholecystectomy who presents with continued abdominal pain.  According to patient, she has been having abdominal pain for about a month and has been seen multiple times in the emergency department.  She reports she was seen several days ago and they were going to do a CT scan but she reports she was in the hallway near the trauma rooms and decided she would leave and return.  She reports she is still having severe abdominal pain that is up to 8 out of 10 in severity across her abdomen and is not improving.  She reports it is not different with positioning, it is not affected by eating or drinking, and she reports she previously had constipation but MiraLAX has improved that.  She is having daily bowel movements that are not  constipation or diarrhea and is having better bowel movements.  She denies any urinary changes.  Denies trauma.  Denies rashes.  She is scheduled to see a GI doctor on Monday but due to the continued abdominal pain she presents to get a CT scan to make sure there is not some new problem.  Of note, patient's had multiple CT scans this month already and were reassuring however due  to the severe pain she is still concerned about diverticulitis or other acute cause.  She otherwise no fevers, chills, congestion, cough, nausea, or vomiting.  Denies any other complaints.  On exam, lungs clear.  Chest nontender.  Abdomen is diffusely tender.  Bowel sounds are appreciated.  Legs not critically tender.  Patient otherwise resting but reporting severe abdominal pain.  We reviewed her chart and documentation from her recent visit did want to do a CT scan again.  We will get the CT scan and get screening labs.  Will give a GI cocktail and some pain medicine.  I anticipate if imaging is unchanged and reassuring and her labs do not show some new abnormality, she is likely stable for discharge and follow-up with her GI team on Monday and patient agrees.  Anticipate reassessment after workup.  CT scan showed no acute diverticulitis, perforation, obstruction, or acute abnormality.  Patient will be discharged to follow-up with her GI team on Monday and she agrees with this plan.  Patient discharged in good condition after similar labs and reassuring CT imaging.         Final Clinical Impression(s) / ED Diagnoses Final diagnoses:  Generalized abdominal pain    Clinical Impression: 1. Generalized abdominal pain     Disposition: Discharge  Condition: Good  I have discussed the results, Dx and Tx plan with the pt(& family if present). He/she/they expressed understanding and agree(s) with the plan. Discharge instructions discussed at great length. Strict return precautions discussed and pt &/or family have  verbalized understanding of the instructions. No further questions at time of discharge.    New Prescriptions   No medications on file    Follow Up: your GI doctor and PCP         Corryn Madewell, Canary Brim, MD 03/12/23 2322

## 2023-03-12 NOTE — ED Triage Notes (Signed)
Here with husband from home for abd pain, "just hurts", some loss of appetite. Seen here for the same. Ongoing for months. Some constipation. Some improvement with miralax. Denies NVD, fever, heartburn. Endorses some gas and distention. Alert, NAD, calm.

## 2023-03-13 ENCOUNTER — Encounter (HOSPITAL_COMMUNITY): Payer: Self-pay

## 2023-03-13 ENCOUNTER — Other Ambulatory Visit: Payer: Self-pay

## 2023-03-13 ENCOUNTER — Emergency Department (HOSPITAL_COMMUNITY)
Admission: EM | Admit: 2023-03-13 | Discharge: 2023-03-13 | Disposition: A | Discharge status: Home / Self Care | Payer: BC Managed Care – PPO | Attending: Emergency Medicine | Admitting: Emergency Medicine

## 2023-03-13 DIAGNOSIS — I10 Essential (primary) hypertension: Secondary | ICD-10-CM | POA: Insufficient documentation

## 2023-03-13 DIAGNOSIS — Z79899 Other long term (current) drug therapy: Secondary | ICD-10-CM | POA: Insufficient documentation

## 2023-03-13 DIAGNOSIS — E119 Type 2 diabetes mellitus without complications: Secondary | ICD-10-CM | POA: Insufficient documentation

## 2023-03-13 DIAGNOSIS — Z7901 Long term (current) use of anticoagulants: Secondary | ICD-10-CM | POA: Insufficient documentation

## 2023-03-13 DIAGNOSIS — G8929 Other chronic pain: Secondary | ICD-10-CM | POA: Insufficient documentation

## 2023-03-13 DIAGNOSIS — R1084 Generalized abdominal pain: Secondary | ICD-10-CM | POA: Insufficient documentation

## 2023-03-13 LAB — COMPREHENSIVE METABOLIC PANEL
ALT: 14 U/L (ref 0–44)
AST: 23 U/L (ref 15–41)
Albumin: 4 g/dL (ref 3.5–5.0)
Alkaline Phosphatase: 47 U/L (ref 38–126)
Anion gap: 15 (ref 5–15)
BUN: 22 mg/dL (ref 8–23)
CO2: 22 mmol/L (ref 22–32)
Calcium: 9.9 mg/dL (ref 8.9–10.3)
Chloride: 100 mmol/L (ref 98–111)
Creatinine, Ser: 1.27 mg/dL — ABNORMAL HIGH (ref 0.44–1.00)
GFR, Estimated: 45 mL/min — ABNORMAL LOW (ref >60)
Glucose, Bld: 105 mg/dL — ABNORMAL HIGH (ref 70–99)
Potassium: 4.4 mmol/L (ref 3.5–5.1)
Sodium: 137 mmol/L (ref 135–145)
Total Bilirubin: 0.9 mg/dL (ref 0.3–1.2)
Total Protein: 6.1 g/dL — ABNORMAL LOW (ref 6.5–8.1)

## 2023-03-13 LAB — CBC
HCT: 38.4 % (ref 36.0–46.0)
Hemoglobin: 12.5 g/dL (ref 12.0–15.0)
MCH: 31.3 pg (ref 26.0–34.0)
MCHC: 32.6 g/dL (ref 30.0–36.0)
MCV: 96 fL (ref 80.0–100.0)
Platelets: 163 10*3/uL (ref 150–400)
RBC: 4 MIL/uL (ref 3.87–5.11)
RDW: 12.5 % (ref 11.5–15.5)
WBC: 4.9 10*3/uL (ref 4.0–10.5)
nRBC: 0 % (ref 0.0–0.2)

## 2023-03-13 LAB — LIPASE, BLOOD: Lipase: 37 U/L (ref 11–51)

## 2023-03-13 MED ORDER — SUCRALFATE 1 GM/10ML PO SUSP: 1.0000 g | Freq: Three times a day (TID) | ORAL | 0 refills | Status: DC

## 2023-03-13 NOTE — ED Provider Notes (Signed)
[place] EMERGENCY DEPARTMENT AT [facility] Provider Note   CSN: [id number] Arrival date & time: 03/13/23  1702     History  Chief Complaint  Patient presents with   Abdominal Pain    Rachel Black is a 72 y.o. female with history of hypertension, hyperlipidemia, gout, GERD, diabetes, arthritis, stroke in 2018, diverticulitis who presents the emergency department complaining of ongoing abdominal pain.  Patient has been having a generalized pain in her abdomen for most a year.  Her husband thinks that it tends to get worse at night.  He often gets a call during the day from her saying that she feels like she is going to die and she needs to go to the hospital, and by the time he gets home she seems a little bit more comfortable.  She states today her abdomen feels slightly worse compared to her visit in the ER yesterday.  Denies any fever, nausea, vomiting, or diarrhea.  She takes Bentyl twice daily, and thinks she took some Zofran this morning.  Patient states she has a follow-up appointment with gastroenterologist scheduled in 2 days.  She did not feel that her pain could wait until then.   Abdominal Pain      Home Medications Prior to Admission medications   Medication Sig Start Date End Date Taking? Authorizing Provider  sucralfate (CARAFATE) 1 GM/10ML suspension Take 10 mLs (1 g total) by mouth 4 (four) times daily -  with meals and at bedtime. 03/13/23  Yes Muneer Leider T, PA-C  ACCU-CHEK AVIVA PLUS test strip USE AS INSTRUCTED ONCE A DAY. DX CODE E11.9 07/20/22   Mayer Masker, PA-C  Accu-Chek Softclix Lancets lancets Use as instructed once a day.  DX Code: E11.9 03/24/22   Mayer Masker, PA-C  acetaminophen (TYLENOL) 500 MG tablet Take 1,000 mg by mouth every 6 (six) hours as needed for mild pain or moderate pain.    [provider]  allopurinol (ZYLOPRIM) 300 MG tablet TAKE 1 TABLET BY MOUTH TWICE A DAY 03/04/23   Carlean Jews, NP  apixaban  (ELIQUIS) 5 MG TABS tablet Take 1 tablet (5 mg total) by mouth 2 (two) times daily. 02/09/23   Saralyn Pilar A, PA  CALCIUM PO Take 2 tablets by mouth daily.    [provider]  dicyclomine (BENTYL) 10 MG capsule Take 1 capsule (10 mg total) by mouth 3 (three) times daily as needed for spasms. 02/20/23   Tilden Fossa, MD  dicyclomine (BENTYL) 20 MG tablet Take 1 tablet (20 mg total) by mouth 2 (two) times daily. 03/04/23   Roxy Horseman, PA-C  Emollient (UDDERLY SMOOTH) CREA Apply 1 application topically See admin instructions. Apply topically to skin folds as needed for rash/irritation    [provider]  famotidine (PEPCID) 10 MG tablet Take 10 mg by mouth daily as needed for heartburn or indigestion.    [provider]  fenofibrate 160 MG tablet TAKE 1 TABLET BY MOUTH EVERY DAY 03/04/23   Carlean Jews, NP  ferrous sulfate 325 (65 FE) MG tablet TAKE 1 TABLET BY MOUTH TWICE A DAY Patient taking differently: Take 325 mg by mouth daily. 08/03/22   Zehr, Princella Pellegrini, PA-C  gabapentin (NEURONTIN) 300 MG capsule Take 1 capsule (300 mg total) by mouth at bedtime. 02/23/23   Gloris Manchester, MD  isosorbide mononitrate (IMDUR) 30 MG 24 hr tablet Take 30 mg by mouth daily. 11/30/22   [provider]  olmesartan (BENICAR) 5 MG  tablet TAKE 1 TABLET (5 MG TOTAL) BY MOUTH DAILY. 02/05/23   Carlean Jews, NP  ondansetron (ZOFRAN) 4 MG tablet Take 1 tablet (4 mg total) by mouth every 6 (six) hours. 02/20/23   Loetta Rough, MD  pantoprazole (PROTONIX) 40 MG tablet Take 40 mg by mouth daily. 12/26/22   [provider]  rosuvastatin (CRESTOR) 40 MG tablet TAKE 1 TABLET BY MOUTH EVERY DAY 01/04/23   Rollene Rotunda, MD      Allergies    Other and Penicillins    Review of Systems   Review of Systems  Gastrointestinal:  Positive for abdominal pain.  All other systems reviewed and are negative.   Physical Exam Updated Vital Signs BP (!) 135/116 (BP Location: Left  Arm)   Pulse 82   Temp 99.2 F (37.3 C) (Oral)   Resp 18   Ht 5\' 6"  (1.676 m)   Wt 86.2 kg   SpO2 96%   BMI 30.67 kg/m  Physical Exam Vitals and nursing note reviewed.  Constitutional:      Appearance: Normal appearance.  HENT:     Head: Normocephalic and atraumatic.  Eyes:     Conjunctiva/sclera: Conjunctivae normal.  Cardiovascular:     Rate and Rhythm: Normal rate and regular rhythm.  Pulmonary:     Effort: Pulmonary effort is normal. No respiratory distress.     Breath sounds: Normal breath sounds.  Abdominal:     General: There is no distension.     Palpations: Abdomen is soft.     Tenderness: There is generalized abdominal tenderness. There is no guarding or rebound.  Skin:    General: Skin is warm and dry.  Neurological:     General: No focal deficit present.     Mental Status: She is alert.     ED Results / Procedures / Treatments   Labs (all labs ordered are listed, but only abnormal results are displayed) Labs Reviewed  COMPREHENSIVE METABOLIC PANEL - Abnormal; Notable for the following components:      Result Value   Glucose, Bld 105 (*)    Creatinine, Ser 1.27 (*)    Total Protein 6.1 (*)    GFR, Estimated 45 (*)    All other components within normal limits  LIPASE, BLOOD  CBC    EKG None  Radiology CT ABDOMEN PELVIS W CONTRAST  Result Date: 03/12/2023 CLINICAL DATA:  Abdominal pain EXAM: CT ABDOMEN AND PELVIS WITH CONTRAST TECHNIQUE: Multidetector CT imaging of the abdomen and pelvis was performed using the standard protocol following bolus administration of intravenous contrast. RADIATION DOSE REDUCTION: This exam was performed according to the departmental dose-optimization program which includes automated exposure control, adjustment of the mA and/or kV according to patient size and/or use of iterative reconstruction technique. CONTRAST:  75mL OMNIPAQUE IOHEXOL 350 MG/ML SOLN COMPARISON:  Previous studies including the examination of 03/02/2023  FINDINGS: Lower chest: Left hemidiaphragm is elevated. Visualized lower lung fields are clear. Hepatobiliary: Surgical clips are seen in gallbladder fossa. There is air in the lumen of intrahepatic bile ducts, possibly suggesting previous sphincterotomy. Distal common bile duct in the head of the pancreas measures 7 mm. Pancreas: No focal abnormalities are seen. Spleen: Unremarkable. Adrenals/Urinary Tract: Adrenals are unremarkable. There is no hydronephrosis. There are no renal or ureteral stones. There is 1.6 cm smooth marginated fluid density lesion in the lateral margin of left kidney suggesting renal cyst. Urinary bladder is unremarkable. Stomach/Bowel: Small hiatal hernia is seen. There is moderate gaseous  distention of stomach. Small bowel loops are not dilated. Appendix is not seen. Cecum is lower than usual in position. There is no pericecal inflammation. There is no significant wall thickening in colon. Scattered diverticula are seen without signs of focal diverticulitis. Vascular/Lymphatic: Scattered calcifications are seen in the femoral arteries. Reproductive: Uterus is not seen. Other: There is no ascites or pneumoperitoneum. Small ventral hernia containing fat is seen in epigastrium. Musculoskeletal: There is first-degree anterolisthesis at L3-L4 level. Spinal stenosis and encroachment of neural foramina is noted from L3-S1 levels. IMPRESSION: There is no evidence of intestinal obstruction or pneumoperitoneum. There is no hydronephrosis. Small hiatal hernia. Diverticulosis of colon without signs of diverticulitis. Left renal cyst. Lumbar spondylosis. Electronically Signed   By: Ernie Avena M.D.   On: 03/12/2023 19:50    Procedures Procedures    Medications Ordered in ED Medications - No data to display  ED Course/ Medical Decision Making/ A&P                             Medical Decision Making Amount and/or Complexity of Data Reviewed Labs: ordered.  Risk Prescription drug  management.  This patient is a 72 y.o. female  who presents to the ED for concern of abdominal pain. Of note 26 ER visits in the past 6 months, mostly for similar symptoms.   Past Medical History / Co-morbidities / Social History: hypertension, hyperlipidemia, gout, GERD, diabetes, arthritis, stroke in 2018, diverticulitis   Additional history: Chart reviewed. Pertinent results include: Reviewed ER visit notes from yesterday in which patient had reassuring laboratory evaluation and CT scan without acute findings.  She was given a GI cocktail and discharge.    Physical Exam: Physical exam performed. The pertinent findings include: Normal vital signs, no acute distress.  Lying comfortably in exam bed.  Abdomen soft with generalized tenderness, no guarding or rebound.  Lab Tests/Imaging studies: I personally interpreted labs/imaging and the pertinent results include: CBC, CMP, and lipase all unremarkable.  Per to be at baseline compared to yesterday's labs.  Disposition: After consideration of the diagnostic results and the patients response to treatment, I feel that emergency department workup does not suggest an emergent condition requiring admission or immediate intervention beyond what has been performed at this time. The plan is: Discharge home with symptomatic management of what seems like chronic abdominal pain.  Patient has numerous ER visits with very reassuring workups, most recently yesterday including labs and CT imaging.  She does have follow-up scheduled with GI in 2 days.  I reviewed past medications that she has been on, does not appear she is ever tried Carafate, so we will prescribe this.  Recommend that she continue her previously prescribed medicines as well.  Today she is clinically well-appearing, with normal vitals, benign exam, and reassuring labs. The patient is safe for discharge and has been instructed to return immediately for worsening symptoms, change in symptoms or any  other concerns.  Final Clinical Impression(s) / ED Diagnoses Final diagnoses:  Chronic abdominal pain    Rx / DC Orders ED Discharge Orders          Ordered    sucralfate (CARAFATE) 1 GM/10ML suspension  3 times daily with meals & bedtime        03/13/23 1851           Portions of this report may have been transcribed using voice recognition software. Every effort was made to ensure accuracy;  however, inadvertent computerized transcription errors may be present.    Jeanella Flattery 03/13/23 1857    Terrilee Files, MD 03/14/23 1053

## 2023-03-13 NOTE — ED Triage Notes (Signed)
Pt c/o generalized abdominal pain x months.  Pain score 9/10.  Pt has been seen numerous times for same.  Also, Pt c/o "odd sensation" in her mouth following a medication in the ED yesterday.  Denies n/v/d.

## 2023-03-13 NOTE — Discharge Instructions (Signed)
You were seen in the emergency department for ongoing abdominal pain.  As we discussed I am prescribing you a medicine called Carafate, which will help coat your stomach lining.  Please follow-up with the gastroenterologist on Monday.

## 2023-03-15 ENCOUNTER — Ambulatory Visit (INDEPENDENT_AMBULATORY_CARE_PROVIDER_SITE_OTHER): Payer: BC Managed Care – PPO | Admitting: Nurse Practitioner

## 2023-03-15 ENCOUNTER — Emergency Department (HOSPITAL_COMMUNITY): Payer: BC Managed Care – PPO

## 2023-03-15 ENCOUNTER — Encounter (HOSPITAL_COMMUNITY): Payer: Self-pay

## 2023-03-15 ENCOUNTER — Other Ambulatory Visit: Payer: Self-pay

## 2023-03-15 ENCOUNTER — Telehealth: Payer: Self-pay | Admitting: Cardiology

## 2023-03-15 ENCOUNTER — Encounter: Payer: Self-pay | Admitting: Nurse Practitioner

## 2023-03-15 ENCOUNTER — Emergency Department (HOSPITAL_COMMUNITY)
Admission: EM | Admit: 2023-03-15 | Discharge: 2023-03-15 | Disposition: A | Discharge status: Home / Self Care | Payer: BC Managed Care – PPO | Attending: Emergency Medicine | Admitting: Emergency Medicine

## 2023-03-15 ENCOUNTER — Emergency Department (INDEPENDENT_AMBULATORY_CARE_PROVIDER_SITE_OTHER): Payer: BC Managed Care – PPO

## 2023-03-15 VITALS — BP 124/82 | HR 83 | Ht 66.0 in | Wt 178.5 lb

## 2023-03-15 DIAGNOSIS — R002 Palpitations: Secondary | ICD-10-CM | POA: Insufficient documentation

## 2023-03-15 DIAGNOSIS — R1032 Left lower quadrant pain: Secondary | ICD-10-CM

## 2023-03-15 DIAGNOSIS — R1012 Left upper quadrant pain: Secondary | ICD-10-CM

## 2023-03-15 DIAGNOSIS — I10 Essential (primary) hypertension: Secondary | ICD-10-CM | POA: Diagnosis not present

## 2023-03-15 DIAGNOSIS — E119 Type 2 diabetes mellitus without complications: Secondary | ICD-10-CM | POA: Insufficient documentation

## 2023-03-15 DIAGNOSIS — R1010 Upper abdominal pain, unspecified: Secondary | ICD-10-CM | POA: Insufficient documentation

## 2023-03-15 DIAGNOSIS — G8929 Other chronic pain: Secondary | ICD-10-CM

## 2023-03-15 DIAGNOSIS — I499 Cardiac arrhythmia, unspecified: Secondary | ICD-10-CM | POA: Diagnosis not present

## 2023-03-15 DIAGNOSIS — Z7901 Long term (current) use of anticoagulants: Secondary | ICD-10-CM | POA: Insufficient documentation

## 2023-03-15 DIAGNOSIS — R1031 Right lower quadrant pain: Secondary | ICD-10-CM | POA: Diagnosis not present

## 2023-03-15 DIAGNOSIS — R1011 Right upper quadrant pain: Secondary | ICD-10-CM | POA: Diagnosis not present

## 2023-03-15 LAB — CBC
HCT: 38.3 % (ref 36.0–46.0)
Hemoglobin: 12.4 g/dL (ref 12.0–15.0)
MCH: 31.2 pg (ref 26.0–34.0)
MCHC: 32.4 g/dL (ref 30.0–36.0)
MCV: 96.5 fL (ref 80.0–100.0)
Platelets: 176 10*3/uL (ref 150–400)
RBC: 3.97 MIL/uL (ref 3.87–5.11)
RDW: 12.5 % (ref 11.5–15.5)
WBC: 6.2 10*3/uL (ref 4.0–10.5)
nRBC: 0 % (ref 0.0–0.2)

## 2023-03-15 LAB — COMPREHENSIVE METABOLIC PANEL
ALT: 15 U/L (ref 0–44)
AST: 21 U/L (ref 15–41)
Albumin: 4 g/dL (ref 3.5–5.0)
Alkaline Phosphatase: 47 U/L (ref 38–126)
Anion gap: 8 (ref 5–15)
BUN: 19 mg/dL (ref 8–23)
CO2: 25 mmol/L (ref 22–32)
Calcium: 9.8 mg/dL (ref 8.9–10.3)
Chloride: 105 mmol/L (ref 98–111)
Creatinine, Ser: 1.11 mg/dL — ABNORMAL HIGH (ref 0.44–1.00)
GFR, Estimated: 53 mL/min — ABNORMAL LOW (ref >60)
Glucose, Bld: 111 mg/dL — ABNORMAL HIGH (ref 70–99)
Potassium: 4 mmol/L (ref 3.5–5.1)
Sodium: 138 mmol/L (ref 135–145)
Total Bilirubin: 0.5 mg/dL (ref 0.3–1.2)
Total Protein: 6.4 g/dL — ABNORMAL LOW (ref 6.5–8.1)

## 2023-03-15 LAB — LIPASE, BLOOD: Lipase: 49 U/L (ref 11–51)

## 2023-03-15 LAB — URINALYSIS, ROUTINE W REFLEX MICROSCOPIC
Bilirubin Urine: NEGATIVE
Glucose, UA: NEGATIVE mg/dL
Hgb urine dipstick: NEGATIVE
Ketones, ur: NEGATIVE mg/dL
Leukocytes,Ua: NEGATIVE
Nitrite: NEGATIVE
Protein, ur: NEGATIVE mg/dL
Specific Gravity, Urine: 1.01 (ref 1.005–1.030)
pH: 5 (ref 5.0–8.0)

## 2023-03-15 LAB — TROPONIN I (HIGH SENSITIVITY)
Troponin I (High Sensitivity): 6 ng/L (ref <18)
Troponin I (High Sensitivity): 6 ng/L (ref <18)

## 2023-03-15 MED ORDER — IOHEXOL 350 MG/ML SOLN
75.0000 mL | Freq: Once | INTRAVENOUS | Status: AC | PRN
  Administered 2023-03-15: 75 mL via INTRAVENOUS

## 2023-03-15 MED ORDER — SUCRALFATE 1 GM/10ML PO SUSP: 1.0000 g | Freq: Two times a day (BID) | ORAL | 0 refills | Status: DC

## 2023-03-15 NOTE — Telephone Encounter (Signed)
Per Dr Herbie Baltimore - 7 day Zio placed - irregular heart rate    Follow- up with Dr Antoine Poche in 6 to 8 weeks    Instruction sent to patient via mychart

## 2023-03-15 NOTE — ED Provider Notes (Signed)
[place] [facility]MENT AT [facility] Provider Note   CSN: [id number] Arrival date & time: 03/15/23  1516     History  Chief Complaint  Patient presents with   Abdominal Pain   Palpitations    Rachel Black is a 72 y.o. female with past medical history significant for hypertension, hyperlipidemia, diabetes, GERD, hiatal hernia, diverticulitis, stroke, chronic abdominal pain presents to the ED complaining of worsening upper abdominal pain and palpitations.  She states her abdominal pain became worse this morning.  Her heart palpitations also began today.  Patient was seen by GI earlier today for chronic abdominal pain and had arrhythmia while in the office.  Patient's cardiologist has already been contacted and she will be given continuous monitor to wear.  Denies chest tightness, shortness of breath, diarrhea, nausea, vomiting, blood in her stool.         Home Medications Prior to Admission medications   Medication Sig Start Date End Date Taking? Authorizing Provider  ACCU-CHEK AVIVA PLUS test strip USE AS INSTRUCTED ONCE A DAY. DX CODE E11.9 07/20/22   Mayer Masker, PA-C  Accu-Chek Softclix Lancets lancets Use as instructed once a day.  DX Code: E11.9 03/24/22   Mayer Masker, PA-C  acetaminophen (TYLENOL) 500 MG tablet Take 1,000 mg by mouth every 6 (six) hours as needed for mild pain or moderate pain.    [provider]  allopurinol (ZYLOPRIM) 300 MG tablet TAKE 1 TABLET BY MOUTH TWICE A DAY 03/04/23   Carlean Jews, NP  apixaban (ELIQUIS) 5 MG TABS tablet Take 1 tablet (5 mg total) by mouth 2 (two) times daily. 02/09/23   Saralyn Pilar A, PA  CALCIUM PO Take 2 tablets by mouth daily.    [provider]  dicyclomine (BENTYL) 20 MG tablet Take 1 tablet (20 mg total) by mouth 2 (two) times daily. 03/04/23   Roxy Horseman, PA-C  Emollient (UDDERLY SMOOTH) CREA Apply 1 application topically See admin instructions. Apply topically to skin  folds as needed for rash/irritation    [provider]  famotidine (PEPCID) 10 MG tablet Take 10 mg by mouth daily as needed for heartburn or indigestion.    [provider]  fenofibrate 160 MG tablet TAKE 1 TABLET BY MOUTH EVERY DAY 03/04/23   Carlean Jews, NP  ferrous sulfate 325 (65 FE) MG tablet TAKE 1 TABLET BY MOUTH TWICE A DAY Patient taking differently: Take 325 mg by mouth daily. 08/03/22   Zehr, Princella Pellegrini, PA-C  gabapentin (NEURONTIN) 300 MG capsule Take 1 capsule (300 mg total) by mouth at bedtime. 02/23/23   Gloris Manchester, MD  isosorbide mononitrate (IMDUR) 30 MG 24 hr tablet Take 30 mg by mouth daily. 11/30/22   [provider]  olmesartan (BENICAR) 5 MG tablet TAKE 1 TABLET (5 MG TOTAL) BY MOUTH DAILY. 02/05/23   Carlean Jews, NP  ondansetron (ZOFRAN) 4 MG tablet Take 1 tablet (4 mg total) by mouth every 6 (six) hours. 02/20/23   Loetta Rough, MD  pantoprazole (PROTONIX) 40 MG tablet Take 40 mg by mouth daily. 12/26/22   [provider]  rosuvastatin (CRESTOR) 40 MG tablet TAKE 1 TABLET BY MOUTH EVERY DAY 01/04/23   Rollene Rotunda, MD  sucralfate (CARAFATE) 1 GM/10ML suspension Take 10 mLs (1 g total) by mouth 2 (two) times daily. 03/15/23   Arnaldo Natal, NP      Allergies    Other and Penicillins    Review of  Systems   Review of Systems  Respiratory:  Negative for chest tightness and shortness of breath.   Cardiovascular:  Positive for palpitations.  Gastrointestinal:  Positive for abdominal pain. Negative for blood in stool, diarrhea, nausea and vomiting.    Physical Exam Updated Vital Signs BP (!) 148/71 (BP Location: Left Arm)   Pulse (!) 49   Temp 98.1 F (36.7 C) (Oral)   Resp 16   Ht 5\' 6"  (1.676 m)   Wt 80.7 kg   SpO2 100%   BMI 28.73 kg/m  Physical Exam Vitals and nursing note reviewed.  Constitutional:      General: She is not in acute distress.    Appearance: Normal appearance. She is not ill-appearing  or diaphoretic.  Cardiovascular:     Rate and Rhythm: Normal rate and regular rhythm. No extrasystoles are present.    Heart sounds: Normal heart sounds.  Pulmonary:     Effort: Pulmonary effort is normal. No tachypnea or respiratory distress.     Breath sounds: Normal breath sounds and air entry.  Abdominal:     General: Abdomen is flat. Bowel sounds are normal.     Palpations: Abdomen is soft.     Tenderness: There is generalized abdominal tenderness.     Hernia: No hernia is present.  Skin:    General: Skin is warm and dry.     Capillary Refill: Capillary refill takes less than 2 seconds.  Neurological:     Mental Status: She is alert. Mental status is at baseline.  Psychiatric:        Mood and Affect: Mood normal.        Behavior: Behavior normal.     ED Results / Procedures / Treatments   Labs (all labs ordered are listed, but only abnormal results are displayed) Labs Reviewed  COMPREHENSIVE METABOLIC PANEL - Abnormal; Notable for the following components:      Result Value   Glucose, Bld 111 (*)    Creatinine, Ser 1.11 (*)    Total Protein 6.4 (*)    GFR, Estimated 53 (*)    All other components within normal limits  LIPASE, BLOOD  CBC  URINALYSIS, ROUTINE W REFLEX MICROSCOPIC  TROPONIN I (HIGH SENSITIVITY)  TROPONIN I (HIGH SENSITIVITY)    EKG EKG Interpretation Date/Time:  Monday March 15 2023 16:24:46 EDT Ventricular Rate:  85 PR Interval:  178 QRS Duration:  98 QT Interval:  366 QTC Calculation: 435 R Axis:   -47  Text Interpretation: Normal sinus rhythm Left anterior fascicular block Cannot rule out Anterior infarct , age undetermined Abnormal ECG When compared with ECG of 07-Mar-2023 16:17, No significant change since last tracing Confirmed by Alvino Blood ([id number]) on 03/15/2023 7:40:46 PM  Radiology CT ABDOMEN PELVIS W CONTRAST  Result Date: 03/15/2023 CLINICAL DATA:  Abdominal pain EXAM: CT ABDOMEN AND PELVIS WITH CONTRAST TECHNIQUE: Multidetector  CT imaging of the abdomen and pelvis was performed using the standard protocol following bolus administration of intravenous contrast. RADIATION DOSE REDUCTION: This exam was performed according to the departmental dose-optimization program which includes automated exposure control, adjustment of the mA and/or kV according to patient size and/or use of iterative reconstruction technique. CONTRAST:  75mL OMNIPAQUE IOHEXOL 350 MG/ML SOLN COMPARISON:  Multiple recent priors, including 03/12/2023, 03/02/2023, 02/28/2023, 02/26/2023, and 02/20/2023 FINDINGS: Lower chest: Lung bases are clear. Hepatobiliary: Liver is within normal limits. Status post cholecystectomy. Associated mild pneumobilia. Common duct is within normal limits for postoperative state. Pancreas: Within normal limits. Spleen:  Within normal limits. Adrenals/Urinary Tract: Adrenal glands are within normal limits. 14 mm lateral interpolar left renal cyst (series 3/image 27), measuring simple fluid density, benign (Bosniak I). No follow-up is recommended. Right kidney is within normal limits. No hydronephrosis. Bladder is within normal limits. Stomach/Bowel: Stomach is notable for a small hiatal hernia. No evidence of bowel obstruction. Appendix is not discretely visualized. Sigmoid diverticulosis, without convincing pericolonic inflammatory changes to suggest acute diverticulitis. Vascular/Lymphatic: No evidence of abdominal aortic aneurysm. Mesenteric vasculature is patent. No suspicious abdominopelvic lymphadenopathy. Reproductive: Status post hysterectomy. Bilateral ovaries are within normal limits. Other: No abdominopelvic ascites. Musculoskeletal: Mild degenerative changes of the visualized thoracolumbar spine. IMPRESSION: No CT findings to account for the patient's abdominal pain. Sigmoid diverticulosis, without convincing evidence of acute diverticulitis. No interval change from multiple recent priors. Electronically Signed   By: Charline Bills  M.D.   On: 03/15/2023 21:53   DG Chest 2 View  Result Date: 03/15/2023 CLINICAL DATA:  Chest pain EXAM: CHEST - 2 VIEW COMPARISON:  10/04/2022 FINDINGS: Cardiac shadow is enlarged but stable. Tortuous thoracic aorta is seen. Lungs are well aerated bilaterally. No acute bony abnormality is seen. IMPRESSION: No active cardiopulmonary disease. Electronically Signed   By: Alcide Clever M.D.   On: 03/15/2023 20:43    Procedures Procedures    Medications Ordered in ED Medications  iohexol (OMNIPAQUE) 350 MG/ML injection 75 mL (75 mLs Intravenous Contrast Given 03/15/23 2137)    ED Course/ Medical Decision Making/ A&P                             Medical Decision Making Amount and/or Complexity of Data Reviewed Labs: ordered. Radiology: ordered.  Risk Prescription drug management.   This patient presents to the ED with chief complaint(s) of abdominal pain and palpitations with pertinent past medical history of chronic abdominal pain, hypertension, hyperlipidemia, MI.  The complaint involves an extensive differential diagnosis and also carries with it a high risk of complications and morbidity.    The differential diagnosis includes acute on chronic abdominal pain, gastritis, gastroenteritis, diverticulitis, colitis   The initial plan is to obtain baseline labs, UA, troponin  Additional history obtained: Records reviewed  - patient seen by gastroenterology earlier today.  Changes to patient's medications were made.  There were no recommendations regarding imaging or other testing to be done at this time due to patient being seen multiple times in ED for same symptoms.  GI recommended 2nd opinion and/or pain management for her symptoms.  Patient did have episode of bradycardia, but HR quickly returned to baseline.  Patient's cardiologist was informed and per chart review, has already ordered continuous monitor for patient to wear.  Initial Assessment:   Exam significant for generalized  abdominal tenderness that is worse in upper quadrants.  No distension or guarding.  No appreciable hernias.  Heart rate is normal in the 60s with regular rhythm.  Lungs clear to auscultation bilaterally.  Patient does not appear to be in acute distress and is not ill-appearing.   Discussed investigating her abdominal pain with imaging, as she has been seen multiple times in the ED and has had multiple CT and CTA scans.  Patient has had abnormal results in the past, but most recent CT without acute finding.  Discussed with patient if she feels her abdominal pain is worse or has changed, then that warrants investigating.  After shared medical decision making conversation, patient would like to  have imaging done.    Independent ECG/labs interpretation:  The following labs were independently interpreted:  CBC without leukocytosis or anemia.  Metabolic panel with Cr at patient's baseline, no electrolyte disturbances.  LFTs unremarkable.  UA without infection.   Independent visualization and interpretation of imaging: I independently visualized the following imaging with scope of interpretation limited to determining acute life threatening conditions related to emergency care: chest x-ray, which revealed no active cardiopulmonary disease.  CT abdomen pelvis without acute intra-abdominal pathology to explain patient's symptoms.  There is diverticulosis without diverticulitis.   Disposition:   Patient does not have evidence of acute intra-abdominal finding to explain her worsening abdominal pain.  Suspect that her symptoms are still chronic and she will need outpatient follow-up with GI.  Patient to follow up outpatient with cardiology about palpitations.  The patient has been appropriately medically screened and/or stabilized in the ED. I have low suspicion for any other emergent medical condition which would require further screening, evaluation or treatment in the ED or require inpatient management. At time  of discharge the patient is hemodynamically stable and in no acute distress. I have discussed work-up results and diagnosis with patient and answered all questions. Patient is agreeable with discharge plan. We discussed strict return precautions for returning to the emergency department and they verbalized understanding.            Final Clinical Impression(s) / ED Diagnoses Final diagnoses:  Chronic abdominal pain  Palpitations    Rx / DC Orders ED Discharge Orders     None         Lenard Simmer, PA-C 03/15/23 2305    Lonell Grandchild, MD 03/16/23 1200

## 2023-03-15 NOTE — Progress Notes (Signed)
03/15/2023 [name] [id number] 1950/11/05   Chief Complaint: Chronic abdominal pan   History of Present Illness: Rachel Black. Rachel Black is a 72 year old female with a past medical history of hypertension, CAD s/p STEMI 10/2017, small RLL PE on Eliquis 01/2023, CVA, DM type II, GERD, diverticulosis and colon polyps. She presents today with complaints of chronic abdominal pain x 1 year. She is accompanied by her husband. She endorses having chronic upper abdominal pain, sometimes has lower abdominal pain which occurs daily for the past year. She is mostly concerned regarding her level of upper abdominal pain.  She recently started taking Carafate 4 times daily.  She is taking Pantoprazole 40 mg daily.  She takes Dicyclomine as needed without improvement.  She has nausea without vomiting. No specific food triggers. She stated losing a lot of weight. Weight today 178 lbs (weight 175 lbs 02/2022).  She passes a soft dark bowel movement daily.  No diarrhea.  Her stools often look black since being on oral iron.  She is on Eliquis due to having a PE 01/2023. No Pepto bismol use.   She was previously admitted to the hospital 5/1 - 01/15/2023 with abdominal pain. CTAP showed possible mild colitis the descending/sigmoid colon treated with Rocephin and Flagyl and discharged home on Cipro and Flagyl. Chest CTA showed a small right lower lobe PE started on Lovenox then transitioned to Eliquis.  Readmitted to the hospital 5/9 - 01/26/2023 with acute sigmoid diverticulitis vs mild sigmoid colitis per CTAP treated with IV antibiotics and subsequently discharged home on Bactrim twice daily x 12 days.  Readmitted to the hospital 01/27/2023 with recurrent abdominal pain.  CTAP was negative for diverticulitis or colitis.  She received IV antibiotics and was seen by general surgery, no indication for surgical intervention. She was discharged home on Augmentin twice daily for 10 days.  She went to the ED due to having  abdominal pain on 6/4, 6/7, 6/8, 6/10/ 6/14, 6/16, 6/18, 6/20, 02/2020, 6/23, 6/28 and 03/13/2023. See CTA and CTAP results as summarized below. She was prescribed a course of Augmentin for possible mild acute sigmoid diverticulitis as seen per CT 02/26/2023.  A repeat CTAP 02/28/2023 without evidence of diverticulitis and showed a large amount of stool throughout the colon.  Normal white blood cell counts and hemoglobin level stable in the 12's.  Abdominal pelvic CTA 02/20/2023:  1. No acute abnormality in the abdomen or pelvis or evidence of mesenteric ischemia. 2. Extensive sigmoid diverticulosis without evidence of Diverticulitis  CTAP  with contrast 02/26/2023: 1. Sigmoid diverticulosis with possible mild acute diverticulitis. Clinical correlation is recommended. 2. No bowel obstruction. Normal appendix. 3. Biliary ductal dilatation and pneumobilia, post cholecystectomy. Treated with Augmentin   CTAP with contrast 02/28/2023: 1. No acute localizing process in the abdomen or pelvis. 2. Sigmoid colon diverticulosis without evidence for diverticulitis. 3. Large amount of stool throughout the colon. 4. Small hiatal hernia.  CTAP with contrast 03/02/2023: 1. No acute localizing process in the abdomen or pelvis. 2. Colonic diverticulosis. 3. Small hiatal hernia.  CTAP with contrast 03/12/2023: There is no evidence of intestinal obstruction or pneumoperitoneum. There is no hydronephrosis.  Small hiatal hernia. Diverticulosis of colon without signs of diverticulitis. Left renal cyst. Lumbar spondylosis.     Latest Ref Rng & Units 03/13/2023    5:13 PM 03/12/2023    5:25 PM 03/07/2023    5:02 PM  CBC  WBC 4.0 - 10.5 K/uL 4.9  6.6  5.5   Hemoglobin 12.0 - 15.0 g/dL 16.1  09.6  04.5   Hematocrit 36.0 - 46.0 % 38.4  38.2  36.0   Platelets 150 - 400 K/uL 163  172  159        Latest Ref Rng & Units 03/13/2023    5:13 PM 03/12/2023    5:25 PM 03/07/2023    5:02 PM  CMP  Glucose 70 - 99 mg/dL  409  811  914   BUN 8 - 23 mg/dL 22  17  15    Creatinine 0.44 - 1.00 mg/dL 7.82  9.56  2.13   Sodium 135 - 145 mmol/L 137  137  137   Potassium 3.5 - 5.1 mmol/L 4.4  4.6  3.8   Chloride 98 - 111 mmol/L 100  104  107   CO2 22 - 32 mmol/L 22  24  21    Calcium 8.9 - 10.3 mg/dL 9.9  9.7  9.2   Total Protein 6.5 - 8.1 g/dL 6.1  6.2  5.9   Total Bilirubin 0.3 - 1.2 mg/dL 0.9  0.4  0.5   Alkaline Phos 38 - 126 U/L 47  45  44   AST 15 - 41 U/L 23  17  18    ALT 0 - 44 U/L 14  13  12      EGD 04/10/2022: 1. GERD with esophageal stricture  2. Gastric polyps  3. No GI cause for symptoms found. Suspect related to anxiety/depression. - POLYPOID FOVEOLAR HYPERPLASIA WITH ULCERATION AND GRANULATION TISSUE, CONSISTENT WITHHYPERPLASTIC POLYP. - NO DYSPLASIA OR MALIGNANCY.. - NEGATIVE FOR DYSPLASIA.  Colonoscopy 10/13/2021: - Four 4 to 6 mm polyps in the descending colon, in the transverse colon and in the ascending colon, removed with a cold snare. Resected and retrieved. - Diverticulosis in the left colon. - The examination was otherwise normal on direct and retroflexion views. - 3 Year recall colonoscopy  1. Surgical [P], colon nos, random sites - TUBULAR ADENOMA(S) - NEGATIVE FOR HIGH-GRADE DYSPLASIA OR MALIGNANCY 2. Surgical [P], colon, descending, transverse, and ascending, polyp (4) - TUBULAR ADENOMA(S) - NEGATIVE FOR HIGH-GRADE DYSPLASIA OR MALIGNANCY  Current Outpatient Medications on File Prior to Visit  Medication Sig Dispense Refill   ACCU-CHEK AVIVA PLUS test strip USE AS INSTRUCTED ONCE A DAY. DX CODE E11.9 100 strip 1   Accu-Chek Softclix Lancets lancets Use as instructed once a day.  DX Code: E11.9 100 each 1   acetaminophen (TYLENOL) 500 MG tablet Take 1,000 mg by mouth every 6 (six) hours as needed for mild pain or moderate pain.     allopurinol (ZYLOPRIM) 300 MG tablet TAKE 1 TABLET BY MOUTH TWICE A DAY 180 tablet 1   apixaban (ELIQUIS) 5 MG TABS tablet Take 1 tablet (5 mg total)  by mouth 2 (two) times daily. 60 tablet 0   CALCIUM PO Take 2 tablets by mouth daily.     dicyclomine (BENTYL) 20 MG tablet Take 1 tablet (20 mg total) by mouth 2 (two) times daily. 20 tablet 0   Emollient (UDDERLY SMOOTH) CREA Apply 1 application topically See admin instructions. Apply topically to skin folds as needed for rash/irritation     famotidine (PEPCID) 10 MG tablet Take 10 mg by mouth daily as needed for heartburn or indigestion.     fenofibrate 160 MG tablet TAKE 1 TABLET BY MOUTH EVERY DAY 90 tablet 0   ferrous sulfate 325 (65 FE) MG tablet TAKE 1 TABLET BY MOUTH TWICE A DAY (Patient  taking differently: Take 325 mg by mouth daily.) 180 tablet 3   gabapentin (NEURONTIN) 300 MG capsule Take 1 capsule (300 mg total) by mouth at bedtime. 30 capsule 1   isosorbide mononitrate (IMDUR) 30 MG 24 hr tablet Take 30 mg by mouth daily.     olmesartan (BENICAR) 5 MG tablet TAKE 1 TABLET (5 MG TOTAL) BY MOUTH DAILY. 90 tablet 0   ondansetron (ZOFRAN) 4 MG tablet Take 1 tablet (4 mg total) by mouth every 6 (six) hours. 12 tablet 0   pantoprazole (PROTONIX) 40 MG tablet Take 40 mg by mouth daily.     rosuvastatin (CRESTOR) 40 MG tablet TAKE 1 TABLET BY MOUTH EVERY DAY 90 tablet 1   No current facility-administered medications on file prior to visit.   Allergies  Allergen Reactions   Other Other (See Comments)    Some BANDAIDS cause skin irritation    Penicillins Itching     Tolerated Zosyn couse 10/04/22   Current Medications, Allergies, Past Medical History, Past Surgical History, Family History and Social History were reviewed in [employer] record.  Review of Systems:   Constitutional: + Weight loss.   Respiratory: Negative for shortness of breath.   Cardiovascular: Negative for chest pain, palpitations and leg swelling.  Gastrointestinal: See HPI.  Musculoskeletal: Negative for back pain or muscle aches.  Neurological: Negative for dizziness, headaches or  paresthesias.   Physical Exam: BP 124/82   Pulse 83   Ht 5\' 6"  (1.676 m)   Wt 178 lb 8 oz (81 kg)   SpO2 95%   BMI 28.81 kg/m    General: Chronically ill-appearing, pale complected 72 year old female presents in a wheelchair in no acute distress. Head: Normocephalic and atraumatic. Eyes: No scleral icterus. Conjunctiva pink . Ears: Normal auditory acuity. Mouth: Dentition intact. No ulcers or lesions.  Lungs: Clear throughout to auscultation. Heart: Heart rhythm was irregular and briefly slow in the 40s then HR increased to 60 to 80 b/min but remained irregular which occurred on exam after patient was transferred from the wheelchair to the exam table with assistance without significant exertion.  Abdomen: Soft, nontender and nondistended. No masses or hepatomegaly. Normal bowel sounds x 4 quadrants.  Rectal: Deferred.  Musculoskeletal: Symmetrical with no gross deformities. Extremities: No edema. Neurological: Alert oriented x 4. No focal deficits.  Psychological: Alert and cooperative. Normal mood and affect  Assessment and Recommendations:  72 year old female with chronic abdominal pain (upper > lower) who was admitted to the hospital x 15 Jan 2023 with abdominal pain secondary to diverticulitis vs colitis treated with multiple courses of antibiotics. She presented to the ED x 10+ encounters June 2024 with persistent upper/generalized abdominal pain. CTA without evidence of mesenteric ischemia or GI bleed. CTAP 6/14 showed possible mild sigmoid diverticulitis treated with Augmentin, last 3 CTAPs showed diverticulosis without evidence of diverticulitis or colitis.  Reviewed case with Dr. Marina Goodell, no plans for any endoscopic evaluation or repeat image studies recommended. Patient's current GI symptoms do not warrant hospital admission at this time. -Stop Dicyclomine -FDgard 2 capsules p.o. twice daily -Consider pain management versus trial with Amitriptyline, defer to PCP -Recommend 2nd  GI opinion   Irregular heart rhythm on exam, heart rate briefly in the 40s per auscultation after patient was transferred from the wheelchair to the exam table. HR increased to 60 to 80 b/min, rhythm remained irregular. No CP, dizziness, palpitations or SOB. I spoke with cardiologist Dr. Herbie Baltimore (covering for Dr. Antoine Poche) and  he did not recommend EKG in office at this time as patient will likely resume her normal rhythm prior to arriving to their office. Dr. Herbie Baltimore recommended a continuous heart monitor, his staff will contact patient to arrange. -Patient was instructed go to emergency room if she develops chest pain, palpitations, dizziness or shortness of breath  History of tubular adenomatous colon polyps -Next colonoscopy due 09/2024  History of GERD -Continue Pantoprazole 40 mg daily -Reduce Carafate to 1 g p.o. twice daily, do not take within 4 hours of any other medication  PE 01/2023 on Eliquis   Chronic anemia. Stools dark on Ferrous Sulfate every day. Hg stable 12.5.   Anxiety and depression, likely contributing to chronic GI symptoms -Follow up with PCP  Today's encounter was 45 minutes which included precharting, extensive chart/result review, history/exam, face-to-face time used for counseling, contacting the patient's cardiologist, consulting with Dr. Marina Goodell, formulating a treatment plan with follow-up and documentation.

## 2023-03-15 NOTE — Patient Instructions (Addendum)
FDGuard- 2 by mouth twice daily  Reduce Carafate to twice daily. Do not take Sulcrafate within 4 hours of any other medications.  Follow up with your cardiologist, Dr.Hochrein.  Go to the ED if you develop chest pains, dizziness or shortness of breath.  Due to recent changes in healthcare laws, you may see the results of your imaging and laboratory studies on MyChart before your provider has had a chance to review them.  We understand that in some cases there may be results that are confusing or concerning to you. Not all laboratory results come back in the same time frame and the provider may be waiting for multiple results in order to interpret others.  Please give [place] 48 hours in order for your provider to thoroughly review all the results before contacting the office for clarification of your results.   Thank you for trusting me with your gastrointestinal care!   Alcide Evener, CRNP

## 2023-03-15 NOTE — Telephone Encounter (Signed)
Sounds like she may be having some ectopy or shoulder bursts of PAT followed by bradycardia.  May be very difficult with her being on a wheelchair etc. to come for an EKG.  Perhaps the best thing would be to recheck 7-day Zio patch monitor (for abnormal heart beat/palpitations) she can then be scheduled to see either Dr. Antoine Poche or APP in follow-up.   Bryan Lemma, MD

## 2023-03-15 NOTE — Progress Notes (Signed)
Noted  

## 2023-03-15 NOTE — Telephone Encounter (Signed)
[place] office calling to state the pt is having irregular beats and would like to schedule a EKG. Please advise

## 2023-03-15 NOTE — ED Triage Notes (Addendum)
Pt to ED c/o generalized abdominal pain x 1 year, coming from GI office, reports does not feel they addressed her concerns,  pt also reports "heart racing" x 1 day. Pt A&O x 4 in triage.

## 2023-03-15 NOTE — Progress Notes (Unsigned)
Enrolled for [employer] to mail a ZIO XT long term holter monitor to the patients address on file.   Dr. Antoine Poche to read and follow up.

## 2023-03-15 NOTE — Discharge Instructions (Addendum)
Thank you for allowing me to be a part of your care today.  You were evaluated in the ED for abdominal pain and palpitations.   Your workup today is overall reassuring.  Your CT scan today did not show evidence of active infection or inflammation that would require treatment.  I highly recommend you follow up with your gastroenterologist or obtain a 2nd opinion regarding your pain.   Please follow up with your cardiologist as well regarding your palpitations.  Your blood work and ECG did not show evidence of a heart attack or damage to your heart.    Return to the ED if you develop sudden worsening of your symptoms or if you have any new concerns.

## 2023-03-16 ENCOUNTER — Encounter (HOSPITAL_COMMUNITY): Payer: Self-pay

## 2023-03-16 ENCOUNTER — Telehealth: Payer: Self-pay | Admitting: Nurse Practitioner

## 2023-03-16 ENCOUNTER — Emergency Department (HOSPITAL_COMMUNITY)
Admission: EM | Admit: 2023-03-16 | Discharge: 2023-03-16 | Disposition: A | Discharge status: Home / Self Care | Payer: BC Managed Care – PPO | Attending: Emergency Medicine | Admitting: Emergency Medicine

## 2023-03-16 ENCOUNTER — Other Ambulatory Visit: Payer: Self-pay

## 2023-03-16 DIAGNOSIS — I10 Essential (primary) hypertension: Secondary | ICD-10-CM | POA: Diagnosis not present

## 2023-03-16 DIAGNOSIS — E119 Type 2 diabetes mellitus without complications: Secondary | ICD-10-CM | POA: Diagnosis not present

## 2023-03-16 DIAGNOSIS — R109 Unspecified abdominal pain: Secondary | ICD-10-CM | POA: Diagnosis present

## 2023-03-16 DIAGNOSIS — G8929 Other chronic pain: Secondary | ICD-10-CM | POA: Insufficient documentation

## 2023-03-16 DIAGNOSIS — Z96653 Presence of artificial knee joint, bilateral: Secondary | ICD-10-CM | POA: Insufficient documentation

## 2023-03-16 DIAGNOSIS — Z7901 Long term (current) use of anticoagulants: Secondary | ICD-10-CM | POA: Diagnosis not present

## 2023-03-16 DIAGNOSIS — Z79899 Other long term (current) drug therapy: Secondary | ICD-10-CM | POA: Diagnosis not present

## 2023-03-16 DIAGNOSIS — Z8673 Personal history of transient ischemic attack (TIA), and cerebral infarction without residual deficits: Secondary | ICD-10-CM | POA: Insufficient documentation

## 2023-03-16 DIAGNOSIS — I2510 Atherosclerotic heart disease of native coronary artery without angina pectoris: Secondary | ICD-10-CM | POA: Insufficient documentation

## 2023-03-16 LAB — CBC
HCT: 36.9 % (ref 36.0–46.0)
Hemoglobin: 12.1 g/dL (ref 12.0–15.0)
MCH: 31.6 pg (ref 26.0–34.0)
MCHC: 32.8 g/dL (ref 30.0–36.0)
MCV: 96.3 fL (ref 80.0–100.0)
Platelets: 163 10*3/uL (ref 150–400)
RBC: 3.83 MIL/uL — ABNORMAL LOW (ref 3.87–5.11)
RDW: 12.8 % (ref 11.5–15.5)
WBC: 5.5 10*3/uL (ref 4.0–10.5)
nRBC: 0 % (ref 0.0–0.2)

## 2023-03-16 LAB — COMPREHENSIVE METABOLIC PANEL
ALT: 13 U/L (ref 0–44)
AST: 17 U/L (ref 15–41)
Albumin: 3.8 g/dL (ref 3.5–5.0)
Alkaline Phosphatase: 44 U/L (ref 38–126)
Anion gap: 8 (ref 5–15)
BUN: 19 mg/dL (ref 8–23)
CO2: 23 mmol/L (ref 22–32)
Calcium: 9.7 mg/dL (ref 8.9–10.3)
Chloride: 106 mmol/L (ref 98–111)
Creatinine, Ser: 1.17 mg/dL — ABNORMAL HIGH (ref 0.44–1.00)
GFR, Estimated: 50 mL/min — ABNORMAL LOW (ref >60)
Glucose, Bld: 105 mg/dL — ABNORMAL HIGH (ref 70–99)
Potassium: 4.1 mmol/L (ref 3.5–5.1)
Sodium: 137 mmol/L (ref 135–145)
Total Bilirubin: 0.6 mg/dL (ref 0.3–1.2)
Total Protein: 6.1 g/dL — ABNORMAL LOW (ref 6.5–8.1)

## 2023-03-16 NOTE — ED Provider Notes (Signed)
[place] EMERGENCY DEPARTMENT AT [facility] Provider Note  CSN: [id number] Arrival date & time: 03/16/23 1729  Chief Complaint(s) Abdominal Pain  HPI Rachel Black is a 72 y.o. female with history of disease, hypertension, hyperlipidemia presenting to the emergency room with abdominal pain.  Patient has had abdominal pain for some time.  She has been to the emergency department 13 times in June and has had 6 CT scans including a CT angiography of the abdomen and pelvis.  Her last CT scan was yesterday and did not show any acute process.  She was discharged and she returns because she is continuing to have pain.  She reports she had some nausea earlier which is resolved.  Otherwise no change in her chronic symptoms.  Has been eating and drinking normally.  Still having bowel movements.   Past Medical History Past Medical History:  Diagnosis Date   Arthritis    PAIN AND OA BOTH KNEES AND SHOULDERS AND ELBOWS AND WRIST   Blood transfusion without reported diagnosis 2018   after cholecystectomy   Broken foot, right, closed, initial encounter 09/15/2021   no surgery needed, fell at home and went to ED   Diabetes mellitus    ORAL MEDICATION   Diverticulitis    GERD (gastroesophageal reflux disease)    Gout    NO RECENT FLARE UPS   H/O: rheumatic fever    AS A CHILD - NO KNOWN HEART MURMUR OR HEART PROBLEMS   Heart attack (HCC)    Hyperlipidemia    Hypertension    PFO (patent foramen ovale)    Stroke (HCC) 03/2017   Patient Active Problem List   Diagnosis Date Noted   Anxiety 01/31/2023   Diverticulitis 01/22/2023   Diarrhea 01/14/2023   Colitis 01/14/2023   Acute pulmonary embolism (HCC) 01/14/2023   H/O: CVA (cerebrovascular accident) 01/14/2023   CAD S/P percutaneous coronary angioplasty 01/14/2023   Atopic dermatitis 11/01/2022   Inflammatory polyarthropathy (HCC) 11/01/2022   Palpitations 10/25/2022   Left hip pain 10/04/2022   Epigastric pain 03/16/2022    Irritable bowel syndrome with constipation 03/16/2022   Influenza B 09/16/2021   Cryptogenic stroke (HCC) 01/13/2021   GERD without esophagitis 11/26/2020   Iron deficiency 11/26/2020   Generalized abdominal pain 11/26/2020   Family history of breast cancer in mother 11/27/2019   Cataract of both eyes 11/27/2019   Coronary artery disease involving native coronary artery of native heart without angina pectoris 02/18/2018   NSTEMI (non-ST elevated myocardial infarction) (HCC)    History of rheumatic fever 06/30/2017   PFO (patent foramen ovale)    Cerebrovascular accident (CVA) due to embolism of right middle cerebral artery (HCC) 03/14/2017   Dilation of biliary tract    Elevated LFTs    Choledocholithiasis with obstruction 12/07/2016   AKI (acute kidney injury) (HCC) 12/07/2016   Osteoarthritis of right knee 08/23/2015   Status post total left knee replacement 08/23/2015   Arthritis of knee, right 04/20/2014   Status post total right knee replacement 04/20/2014   Flushing reaction 03/10/2012   Diabetes mellitus (HCC) 02/17/2007   GOUT 02/17/2007   Severe obesity (BMI >= 40) (HCC) 02/17/2007   Mixed hyperlipidemia due to type 2 diabetes mellitus (HCC) 02/17/2007   Hypertension associated with diabetes (HCC) 02/17/2007   Home Medication(s) Prior to Admission medications   Medication Sig Start Date End Date Taking? Authorizing Provider  ACCU-CHEK AVIVA PLUS test strip USE AS INSTRUCTED ONCE A DAY. DX CODE E11.9 07/20/22  Mayer Masker, PA-C  Accu-Chek Softclix Lancets lancets Use as instructed once a day.  DX Code: E11.9 03/24/22   Mayer Masker, PA-C  acetaminophen (TYLENOL) 500 MG tablet Take 1,000 mg by mouth every 6 (six) hours as needed for mild pain or moderate pain.    [provider]  allopurinol (ZYLOPRIM) 300 MG tablet TAKE 1 TABLET BY MOUTH TWICE A DAY 03/04/23   Carlean Jews, NP  apixaban (ELIQUIS) 5 MG TABS tablet Take 1 tablet (5 mg total) by mouth 2  (two) times daily. 02/09/23   Saralyn Pilar A, PA  CALCIUM PO Take 2 tablets by mouth daily.    [provider]  dicyclomine (BENTYL) 20 MG tablet Take 1 tablet (20 mg total) by mouth 2 (two) times daily. 03/04/23   Roxy Horseman, PA-C  Emollient (UDDERLY SMOOTH) CREA Apply 1 application topically See admin instructions. Apply topically to skin folds as needed for rash/irritation    [provider]  famotidine (PEPCID) 10 MG tablet Take 10 mg by mouth daily as needed for heartburn or indigestion.    [provider]  fenofibrate 160 MG tablet TAKE 1 TABLET BY MOUTH EVERY DAY 03/04/23   Carlean Jews, NP  ferrous sulfate 325 (65 FE) MG tablet TAKE 1 TABLET BY MOUTH TWICE A DAY Patient taking differently: Take 325 mg by mouth daily. 08/03/22   Zehr, Princella Pellegrini, PA-C  gabapentin (NEURONTIN) 300 MG capsule Take 1 capsule (300 mg total) by mouth at bedtime. 02/23/23   Gloris Manchester, MD  isosorbide mononitrate (IMDUR) 30 MG 24 hr tablet Take 30 mg by mouth daily. 11/30/22   [provider]  olmesartan (BENICAR) 5 MG tablet TAKE 1 TABLET (5 MG TOTAL) BY MOUTH DAILY. 02/05/23   Carlean Jews, NP  ondansetron (ZOFRAN) 4 MG tablet Take 1 tablet (4 mg total) by mouth every 6 (six) hours. 02/20/23   Loetta Rough, MD  pantoprazole (PROTONIX) 40 MG tablet Take 40 mg by mouth daily. 12/26/22   [provider]  rosuvastatin (CRESTOR) 40 MG tablet TAKE 1 TABLET BY MOUTH EVERY DAY 01/04/23   Rollene Rotunda, MD  sucralfate (CARAFATE) 1 GM/10ML suspension Take 10 mLs (1 g total) by mouth 2 (two) times daily. 03/15/23   Arnaldo Natal, NP                                                                                                                                    Past Surgical History Past Surgical History:  Procedure Laterality Date   ABDOMINAL HYSTERECTOMY     CESAREAN SECTION     CHOLECYSTECTOMY N/A 12/09/2016   Procedure: LAPAROSCOPIC CHOLECYSTECTOMY  WITH INTRAOPERATIVE CHOLANGIOGRAM;  Surgeon: Almond Lint, MD;  Location: [facility] ORS;  Service: General;  Laterality: N/A;   COLONOSCOPY     COLONOSCOPY WITH PROPOFOL N/A 01/30/2013   Procedure: COLONOSCOPY WITH PROPOFOL;  Surgeon: Hilarie Fredrickson, MD;  Location: [facility] ENDOSCOPY;  Service: Endoscopy;  Laterality: N/A;   ERCP N/A 12/08/2016   Procedure: ENDOSCOPIC RETROGRADE CHOLANGIOPANCREATOGRAPHY (ERCP);  Surgeon: Meryl Dare, MD;  Location: [facility];  Service: Endoscopy;  Laterality: N/A;   ESOPHAGOGASTRODUODENOSCOPY (EGD) WITH PROPOFOL N/A 01/30/2013   Procedure: ESOPHAGOGASTRODUODENOSCOPY (EGD) WITH PROPOFOL;  Surgeon: Hilarie Fredrickson, MD;  Location: [facility] ENDOSCOPY;  Service: Endoscopy;  Laterality: N/A;   LEFT HEART CATH AND CORONARY ANGIOGRAPHY N/A 10/18/2017   Procedure: LEFT HEART CATH AND CORONARY ANGIOGRAPHY;  Surgeon: [place], Peter M, MD;  Location: [facility] INVASIVE CV LAB;  Service: Cardiovascular;  Laterality: N/A;   LOOP RECORDER INSERTION N/A 03/18/2017   Procedure: Loop Recorder Insertion;  Surgeon: Regan Lemming, MD;  Location: [facility] INVASIVE CV LAB;  Service: Cardiovascular;  Laterality: N/A;   PARTIAL HYSTERECTOMY     TEE WITHOUT CARDIOVERSION N/A 03/16/2017   Procedure: TRANSESOPHAGEAL ECHOCARDIOGRAM (TEE);  Surgeon: Elease Hashimoto Deloris Ping, MD;  Location: [facility] ENDOSCOPY;  Service: Cardiovascular;  Laterality: N/A;   TOTAL KNEE ARTHROPLASTY Right 04/20/2014   Procedure: RIGHT TOTAL KNEE ARTHROPLASTY CONVERTED TO RIGHT KNEE REIMPLANTATION;  Surgeon: Kathryne Hitch, MD;  Location: [facility] ORS;  Service: Orthopedics;  Laterality: Right;   TOTAL KNEE ARTHROPLASTY Left 08/23/2015   Procedure: LEFT TOTAL KNEE ARTHROPLASTY;  Surgeon: Kathryne Hitch, MD;  Location: [facility] ORS;  Service: Orthopedics;  Laterality: Left;   VENTRAL HERNIA REPAIR     x2   Family History Family History  Problem Relation Age of Onset   Diabetes Mother    Hypertension Mother        entire family   Breast  cancer Mother        diagnosed in her 81's   Stroke Father        CVA   Hyperlipidemia Father        entire family   Diabetes Father    Heart disease Father        No details.  Not at an early age.     Crohn's disease Son    Irritable bowel syndrome Son    Colon cancer Neg Hx    Colon polyps Neg Hx    Esophageal cancer Neg Hx    Stomach cancer Neg Hx    Rectal cancer Neg Hx     Social History Social History   Tobacco Use   Smoking status: Never    Passive exposure: Never   Smokeless tobacco: Never  Vaping Use   Vaping Use: Never used  Substance Use Topics   Alcohol use: Not Currently    Comment: rarely in the past   Drug use: Never   Allergies Other and Penicillins  Review of Systems Review of Systems  All other systems reviewed and are negative.   Physical Exam Vital Signs  I have reviewed the triage vital signs BP 108/76 (BP Location: Left Arm)   Pulse 68   Temp 97.9 F (36.6 C) (Oral)   Resp 20   Ht 5\' 6"  (1.676 m)   Wt 80.7 kg   SpO2 100%   BMI 28.73 kg/m  Physical Exam Vitals and nursing note reviewed.  Constitutional:      General: She is not in acute distress.    Appearance: She is well-developed.  HENT:     Head: Normocephalic and atraumatic.     Mouth/Throat:     Mouth: Mucous membranes are moist.  Eyes:     Pupils: Pupils are equal, round, and reactive to light.  Cardiovascular:  Rate and Rhythm: Normal rate and regular rhythm.     Heart sounds: No murmur heard. Pulmonary:     Effort: Pulmonary effort is normal. No respiratory distress.     Breath sounds: Normal breath sounds.  Abdominal:     General: Abdomen is flat.     Palpations: Abdomen is soft.     Tenderness: There is no abdominal tenderness.  Musculoskeletal:        General: No tenderness.     Right lower leg: No edema.     Left lower leg: No edema.  Skin:    General: Skin is warm and dry.  Neurological:     General: No focal deficit present.     Mental Status: She  is alert. Mental status is at baseline.  Psychiatric:        Mood and Affect: Mood normal.        Behavior: Behavior normal.     ED Results and Treatments Labs (all labs ordered are listed, but only abnormal results are displayed) Labs Reviewed  COMPREHENSIVE METABOLIC PANEL - Abnormal; Notable for the following components:      Result Value   Glucose, Bld 105 (*)    Creatinine, Ser 1.17 (*)    Total Protein 6.1 (*)    GFR, Estimated 50 (*)    All other components within normal limits  CBC - Abnormal; Notable for the following components:   RBC 3.83 (*)    All other components within normal limits                                                                                                                          Radiology CT ABDOMEN PELVIS W CONTRAST  Result Date: 03/15/2023 CLINICAL DATA:  Abdominal pain EXAM: CT ABDOMEN AND PELVIS WITH CONTRAST TECHNIQUE: Multidetector CT imaging of the abdomen and pelvis was performed using the standard protocol following bolus administration of intravenous contrast. RADIATION DOSE REDUCTION: This exam was performed according to the departmental dose-optimization program which includes automated exposure control, adjustment of the mA and/or kV according to patient size and/or use of iterative reconstruction technique. CONTRAST:  75mL OMNIPAQUE IOHEXOL 350 MG/ML SOLN COMPARISON:  Multiple recent priors, including 03/12/2023, 03/02/2023, 02/28/2023, 02/26/2023, and 02/20/2023 FINDINGS: Lower chest: Lung bases are clear. Hepatobiliary: Liver is within normal limits. Status post cholecystectomy. Associated mild pneumobilia. Common duct is within normal limits for postoperative state. Pancreas: Within normal limits. Spleen: Within normal limits. Adrenals/Urinary Tract: Adrenal glands are within normal limits. 14 mm lateral interpolar left renal cyst (series 3/image 27), measuring simple fluid density, benign (Bosniak I). No follow-up is recommended. Right  kidney is within normal limits. No hydronephrosis. Bladder is within normal limits. Stomach/Bowel: Stomach is notable for a small hiatal hernia. No evidence of bowel obstruction. Appendix is not discretely visualized. Sigmoid diverticulosis, without convincing pericolonic inflammatory changes to suggest acute diverticulitis. Vascular/Lymphatic: No evidence of abdominal aortic aneurysm. Mesenteric vasculature is patent. No suspicious abdominopelvic lymphadenopathy. Reproductive:  Status post hysterectomy. Bilateral ovaries are within normal limits. Other: No abdominopelvic ascites. Musculoskeletal: Mild degenerative changes of the visualized thoracolumbar spine. IMPRESSION: No CT findings to account for the patient's abdominal pain. Sigmoid diverticulosis, without convincing evidence of acute diverticulitis. No interval change from multiple recent priors. Electronically Signed   By: Charline Bills M.D.   On: 03/15/2023 21:53   DG Chest 2 View  Result Date: 03/15/2023 CLINICAL DATA:  Chest pain EXAM: CHEST - 2 VIEW COMPARISON:  10/04/2022 FINDINGS: Cardiac shadow is enlarged but stable. Tortuous thoracic aorta is seen. Lungs are well aerated bilaterally. No acute bony abnormality is seen. IMPRESSION: No active cardiopulmonary disease. Electronically Signed   By: Alcide Clever M.D.   On: 03/15/2023 20:43    Pertinent labs & imaging results that were available during my care of the patient were reviewed by me and considered in my medical decision making (see MDM for details).  Medications Ordered in ED Medications - No data to display                                                                                                                                   Procedures Procedures  (including critical care time)  Medical Decision Making / ED Course   MDM:  72 year old female with chronic abdominal pain presenting to the emergency department with abdominal pain.  Her physical exam is reassuring  and she has no focal tenderness.  She has no tenderness at all and her abdomen is soft.  There is no rebound or guarding.  I reviewed her imaging once again from yesterday which did not show any sign of acute process.  Her exam is benign and her labs are stable.  She has no leukocytosis.  I do not think there is an indication to obtain repeat imaging today.  I have a very low concern of any dangerous cause of abdominal pain such as perforation, diverticulitis, obstruction, volvulus, aortic process, vascular catastrophe, pancreatitis, cholecystitis, appendicitis, ileus, or any other acute process.  Her GI team recommend that she follow-up with a tertiary center for second opinion per note today.  Discussed this with the patient.  I discussed with the patient that although we did not see any sign of any change in her chronic symptoms today she is always welcome to return to the emergency department should she wish at any time.      Additional history obtained: -Additional history obtained from spouse -External records from outside source obtained and reviewed including: Chart review including previous notes, labs, imaging, consultation notes including numerous previous ER visits including yesterday as well as prior imaging   Lab Tests: -I ordered, reviewed, and interpreted labs.   The pertinent results include:   Labs Reviewed  COMPREHENSIVE METABOLIC PANEL - Abnormal; Notable for the following components:      Result Value   Glucose, Bld 105 (*)    Creatinine, Ser 1.17 (*)  Total Protein 6.1 (*)    GFR, Estimated 50 (*)    All other components within normal limits  CBC - Abnormal; Notable for the following components:   RBC 3.83 (*)    All other components within normal limits    Notable for no acute changes  Co morbidities that complicate the patient evaluation  Past Medical History:  Diagnosis Date   Arthritis    PAIN AND OA BOTH KNEES AND SHOULDERS AND ELBOWS AND WRIST    Blood transfusion without reported diagnosis 2018   after cholecystectomy   Broken foot, right, closed, initial encounter 09/15/2021   no surgery needed, fell at home and went to ED   Diabetes mellitus    ORAL MEDICATION   Diverticulitis    GERD (gastroesophageal reflux disease)    Gout    NO RECENT FLARE UPS   H/O: rheumatic fever    AS A CHILD - NO KNOWN HEART MURMUR OR HEART PROBLEMS   Heart attack (HCC)    Hyperlipidemia    Hypertension    PFO (patent foramen ovale)    Stroke (HCC) 03/2017      Dispostion: Disposition decision including need for hospitalization was considered, and patient discharged from emergency department.    Final Clinical Impression(s) / ED Diagnoses Final diagnoses:  Chronic abdominal pain     This chart was dictated using voice recognition software.  Despite best efforts to proofread,  errors can occur which can change the documentation meaning.    Lonell Grandchild, MD 03/16/23 2004

## 2023-03-16 NOTE — ED Triage Notes (Signed)
Pt to ED c/o generalized abdominal pain x 1 year, evaluated yesterday for the same. Reports not feeling better.

## 2023-03-16 NOTE — Discharge Instructions (Addendum)
We evaluated you for your abdominal pain.  Your physical exam and lab tests are reassuring.  I do not think there is an indication to get another CT scan.  Since you feel that your GI doctors are not successfully identifying the cause of your symptoms, you could also try to follow-up with [facility] or [facility] gastroenterology.  Please follow-up with your GI doctor and your primary doctor.  You are always welcome to return to the emergency department for repeat evaluation for any reason and if you have any new or worsening symptoms.

## 2023-03-16 NOTE — Telephone Encounter (Signed)
Unable to reach patient, line continues to ring. No voicemail. Will try again at a later time.

## 2023-03-16 NOTE — Telephone Encounter (Signed)
Rachel Black, refer to office visit yesterday. Note, patient presented to the ED yesterday after her office appointment. Initial HR in the ED was 49 and when a 12 lead EKG was done  a bit later her HR was 85 b/min without arrhythmia. Cardiology in process of setting up Zio patch monitor.   Rachel Black, pls inform patient and spouse that I consulted with Dr. Marina Goodell yesterday during her office appointment and he was updated regarding her numerous hospital admissions, ED visits and abdominal imaging. Dr. Marina Goodell recommended sending the patient to a tertiary center for a 2nd GI opinion. Pls verify if patient is willing to see GI at [facility] or [facility] to further evaluate her chronic abdominal pain. Send referral to selected tertiary center.   THX

## 2023-03-17 NOTE — Telephone Encounter (Signed)
Pt was notified of Alcide Evener NP and Dr Marina Goodell recommendations, Pt declined referral to [facility] or [facility]. Routed as [employer]

## 2023-03-17 NOTE — Telephone Encounter (Signed)
Left message for pt to call back  °

## 2023-03-19 ENCOUNTER — Encounter (HOSPITAL_COMMUNITY): Payer: Self-pay

## 2023-03-19 ENCOUNTER — Emergency Department (HOSPITAL_COMMUNITY)
Admission: EM | Admit: 2023-03-19 | Discharge: 2023-03-19 | Disposition: A | Discharge status: Home / Self Care | Payer: BC Managed Care – PPO | Source: Home / Self Care | Attending: Emergency Medicine | Admitting: Emergency Medicine

## 2023-03-19 ENCOUNTER — Other Ambulatory Visit: Payer: Self-pay

## 2023-03-19 DIAGNOSIS — G8929 Other chronic pain: Secondary | ICD-10-CM | POA: Diagnosis not present

## 2023-03-19 DIAGNOSIS — R109 Unspecified abdominal pain: Secondary | ICD-10-CM | POA: Insufficient documentation

## 2023-03-19 DIAGNOSIS — Z7901 Long term (current) use of anticoagulants: Secondary | ICD-10-CM | POA: Insufficient documentation

## 2023-03-19 DIAGNOSIS — I10 Essential (primary) hypertension: Secondary | ICD-10-CM | POA: Insufficient documentation

## 2023-03-19 DIAGNOSIS — Z79899 Other long term (current) drug therapy: Secondary | ICD-10-CM | POA: Diagnosis not present

## 2023-03-19 LAB — URINALYSIS, ROUTINE W REFLEX MICROSCOPIC
Bilirubin Urine: NEGATIVE
Glucose, UA: NEGATIVE mg/dL
Hgb urine dipstick: NEGATIVE
Ketones, ur: NEGATIVE mg/dL
Leukocytes,Ua: NEGATIVE
Nitrite: NEGATIVE
Protein, ur: NEGATIVE mg/dL
Specific Gravity, Urine: 1.01 (ref 1.005–1.030)
pH: 7 (ref 5.0–8.0)

## 2023-03-19 LAB — CBC
HCT: 37.5 % (ref 36.0–46.0)
Hemoglobin: 12.3 g/dL (ref 12.0–15.0)
MCH: 32.2 pg (ref 26.0–34.0)
MCHC: 32.8 g/dL (ref 30.0–36.0)
MCV: 98.2 fL (ref 80.0–100.0)
Platelets: 161 10*3/uL (ref 150–400)
RBC: 3.82 MIL/uL — ABNORMAL LOW (ref 3.87–5.11)
RDW: 12.8 % (ref 11.5–15.5)
WBC: 5.2 10*3/uL (ref 4.0–10.5)
nRBC: 0 % (ref 0.0–0.2)

## 2023-03-19 LAB — COMPREHENSIVE METABOLIC PANEL
ALT: 14 U/L (ref 0–44)
AST: 17 U/L (ref 15–41)
Albumin: 3.8 g/dL (ref 3.5–5.0)
Alkaline Phosphatase: 44 U/L (ref 38–126)
Anion gap: 16 — ABNORMAL HIGH (ref 5–15)
BUN: 25 mg/dL — ABNORMAL HIGH (ref 8–23)
CO2: 20 mmol/L — ABNORMAL LOW (ref 22–32)
Calcium: 9.9 mg/dL (ref 8.9–10.3)
Chloride: 104 mmol/L (ref 98–111)
Creatinine, Ser: 1.13 mg/dL — ABNORMAL HIGH (ref 0.44–1.00)
GFR, Estimated: 52 mL/min — ABNORMAL LOW (ref >60)
Glucose, Bld: 104 mg/dL — ABNORMAL HIGH (ref 70–99)
Potassium: 4.3 mmol/L (ref 3.5–5.1)
Sodium: 140 mmol/L (ref 135–145)
Total Bilirubin: 0.8 mg/dL (ref 0.3–1.2)
Total Protein: 6 g/dL — ABNORMAL LOW (ref 6.5–8.1)

## 2023-03-19 LAB — LIPASE, BLOOD: Lipase: 60 U/L — ABNORMAL HIGH (ref 11–51)

## 2023-03-19 NOTE — Discharge Instructions (Signed)
You are seen in the emergency department for chronic abdominal pain.  Your pain appears to have largely resolved while here in the emergency department without treatment.  Your labs are unremarkable.  I would advise a follow-up with [facility] gastroenterology as recommended by your GI specialist.  Return the emergency department if you have any acute worsening or symptoms.

## 2023-03-19 NOTE — ED Triage Notes (Signed)
Pt arrived POV. Pt c/o generalized abd pain, reports N, denies V/D. Pt reports miralax is helping w/ constipation. Pt describes abd pain as achey. Pt in NAD at this time.

## 2023-03-19 NOTE — ED Provider Notes (Signed)
Rachel Black EMERGENCY DEPARTMENT AT [facility] Provider Note   CSN: [id number] Arrival date & time: 03/19/23  1748     History Chief Complaint  Patient presents with   Chronic Abdominal Pain    Rachel Black is a 72 y.o. female.  Patient with past medical history significant for hypertension, hyperlipidemia presents to the emergency department complaints of abdominal pain.  Patient has been seen in the emergency department numerous times for this abdominal pain.  In June alone she was seen approximately 14 times.  Was seen 3 days ago for similar symptoms as well.  Has had numerous CT scans and lab evaluation without any obvious abnormalities noted.  Unsure of exact mechanism of patient's pain but patient has reportedly been referred to [facility] gastroenterology for further evaluation from her [facility] specialist.  While here in the emergency department, patient reports that her pain has now resolved.  HPI     Home Medications Prior to Admission medications   Medication Sig Start Date End Date Taking? Authorizing Provider  ACCU-CHEK AVIVA PLUS test strip USE AS INSTRUCTED ONCE A DAY. DX CODE E11.9 07/20/22   Mayer Masker, PA-C  Accu-Chek Softclix Lancets lancets Use as instructed once a day.  DX Code: E11.9 03/24/22   Mayer Masker, PA-C  acetaminophen (TYLENOL) 500 MG tablet Take 1,000 mg by mouth every 6 (six) hours as needed for mild pain or moderate pain.    [provider]  allopurinol (ZYLOPRIM) 300 MG tablet TAKE 1 TABLET BY MOUTH TWICE A DAY 03/04/23   Carlean Jews, NP  apixaban (ELIQUIS) 5 MG TABS tablet Take 1 tablet (5 mg total) by mouth 2 (two) times daily. 02/09/23   Saralyn Pilar A, PA  CALCIUM PO Take 2 tablets by mouth daily.    [provider]  dicyclomine (BENTYL) 20 MG tablet Take 1 tablet (20 mg total) by mouth 2 (two) times daily. 03/04/23   Roxy Horseman, PA-C  Emollient (UDDERLY SMOOTH) CREA Apply 1 application  topically See admin instructions. Apply topically to skin folds as needed for rash/irritation    [provider]  famotidine (PEPCID) 10 MG tablet Take 10 mg by mouth daily as needed for heartburn or indigestion.    [provider]  fenofibrate 160 MG tablet TAKE 1 TABLET BY MOUTH EVERY DAY 03/04/23   Carlean Jews, NP  ferrous sulfate 325 (65 FE) MG tablet TAKE 1 TABLET BY MOUTH TWICE A DAY Patient taking differently: Take 325 mg by mouth daily. 08/03/22   Zehr, Princella Pellegrini, PA-C  gabapentin (NEURONTIN) 300 MG capsule Take 1 capsule (300 mg total) by mouth at bedtime. 02/23/23   Gloris Manchester, MD  isosorbide mononitrate (IMDUR) 30 MG 24 hr tablet Take 30 mg by mouth daily. 11/30/22   [provider]  olmesartan (BENICAR) 5 MG tablet TAKE 1 TABLET (5 MG TOTAL) BY MOUTH DAILY. 02/05/23   Carlean Jews, NP  ondansetron (ZOFRAN) 4 MG tablet Take 1 tablet (4 mg total) by mouth every 6 (six) hours. 02/20/23   Loetta Rough, MD  pantoprazole (PROTONIX) 40 MG tablet Take 40 mg by mouth daily. 12/26/22   [provider]  rosuvastatin (CRESTOR) 40 MG tablet TAKE 1 TABLET BY MOUTH EVERY DAY 01/04/23   Rollene Rotunda, MD  sucralfate (CARAFATE) 1 GM/10ML suspension Take 10 mLs (1 g total) by mouth 2 (two) times daily. 03/15/23   Arnaldo Natal, NP      Allergies  Other and Penicillins    Review of Systems   Review of Systems  Gastrointestinal:  Positive for abdominal pain.  All other systems reviewed and are negative.   Physical Exam Updated Vital Signs BP 114/64 (BP Location: Right Arm)   Pulse 60   Temp 98.7 F (37.1 C) (Oral)   Resp 17   Ht 5\' 6"  (1.676 m)   Wt 80.7 kg   SpO2 98%   BMI 28.73 kg/m  Physical Exam Vitals and nursing note reviewed.  Constitutional:      General: She is not in acute distress.    Appearance: She is well-developed.  HENT:     Head: Normocephalic and atraumatic.  Eyes:     Conjunctiva/sclera: Conjunctivae normal.   Cardiovascular:     Rate and Rhythm: Normal rate and regular rhythm.     Heart sounds: No murmur heard. Pulmonary:     Effort: Pulmonary effort is normal. No respiratory distress.     Breath sounds: Normal breath sounds.  Abdominal:     Palpations: Abdomen is soft.     Tenderness: There is no abdominal tenderness.  Musculoskeletal:        General: No swelling.     Cervical back: Neck supple.  Skin:    General: Skin is warm and dry.     Capillary Refill: Capillary refill takes less than 2 seconds.  Neurological:     Mental Status: She is alert.  Psychiatric:        Mood and Affect: Mood normal.     ED Results / Procedures / Treatments   Labs (all labs ordered are listed, but only abnormal results are displayed) Labs Reviewed  LIPASE, BLOOD - Abnormal; Notable for the following components:      Result Value   Lipase 60 (*)    All other components within normal limits  COMPREHENSIVE METABOLIC PANEL - Abnormal; Notable for the following components:   CO2 20 (*)    Glucose, Bld 104 (*)    BUN 25 (*)    Creatinine, Ser 1.13 (*)    Total Protein 6.0 (*)    GFR, Estimated 52 (*)    Anion gap 16 (*)    All other components within normal limits  CBC - Abnormal; Notable for the following components:   RBC 3.82 (*)    All other components within normal limits  URINALYSIS, ROUTINE W REFLEX MICROSCOPIC    EKG None  Radiology No results found.  Procedures Procedures   Medications Ordered in ED Medications - No data to display  ED Course/ Medical Decision Making/ A&P                           Medical Decision Making Amount and/or Complexity of Data Reviewed Labs: ordered.   This patient presents to the ED for concern of abdominal pain.  Differential diagnosis includes bowel obstruction, appendicitis, acute cystitis, pyelonephritis, cholelithiasis   Lab Tests:  I Ordered, and personally interpreted labs.  The pertinent results include: CBC and CMP at baseline,  lipase slightly elevated compared to recent lipase levels, UA without evidence of infection   Problem List / ED Course:  Patient presents the emergency department complaints of chronic abdominal pain.  This been ongoing problem for several months and patient has repeatedly been seen in the emergency department times in the last month with most recent visit 3 days ago.  Patient has had extensive workup with labs and imaging  without any acute abnormalities noted.  No specific triggers have been identified to cause patient's symptoms.  Patient has been seen by Alcide Evener that [facility] most recently.  Patient has been advised to follow-up with [facility] for further evaluation the patient is yet to schedule an appointment with them.  At this time, lab workup initiated from triage which was largely unremarkable.  Informed patient of unremarkable lab workup and no acute indication for imaging at this point patient's pain is now resolved.  Unsure of patient's etiology.  Patient is somewhat frustrated that she is not having improvement in her symptoms and has to repeatedly present to the emergency department due to significant pain.  Advised patient that she needs to follow-up with [facility] for further evaluation as they will likely be a better resource for her current condition.  Patient is agreeable to treatment plan verbalized understanding all return precautions.  All questions answered prior to patient discharge.  Final Clinical Impression(s) / ED Diagnoses Final diagnoses:  Chronic abdominal pain    Rx / DC Orders ED Discharge Orders          Ordered    Ambulatory referral to Gastroenterology        03/19/23 2045              Smitty Knudsen, PA-C 03/19/23 2050    Elayne Snare K, DO 03/19/23 2226

## 2023-03-19 NOTE — Telephone Encounter (Signed)
Spoke with pt, aware the monitor has already been mailed to her. She reports then she will wear it. She will call with any questions once she gets the monitor.

## 2023-03-20 DIAGNOSIS — R002 Palpitations: Secondary | ICD-10-CM | POA: Diagnosis not present

## 2023-03-20 DIAGNOSIS — I499 Cardiac arrhythmia, unspecified: Secondary | ICD-10-CM | POA: Diagnosis not present

## 2023-03-24 ENCOUNTER — Ambulatory Visit: Payer: BC Managed Care – PPO | Admitting: Family Medicine

## 2023-03-26 ENCOUNTER — Other Ambulatory Visit: Payer: Self-pay

## 2023-03-26 ENCOUNTER — Emergency Department (HOSPITAL_COMMUNITY)
Admission: EM | Admit: 2023-03-26 | Discharge: 2023-03-26 | Discharge status: Against Medical Advice | Payer: BC Managed Care – PPO | Attending: Emergency Medicine | Admitting: Emergency Medicine

## 2023-03-26 ENCOUNTER — Encounter (HOSPITAL_COMMUNITY): Payer: Self-pay | Admitting: *Deleted

## 2023-03-26 DIAGNOSIS — R109 Unspecified abdominal pain: Secondary | ICD-10-CM | POA: Diagnosis not present

## 2023-03-26 DIAGNOSIS — Z5321 Procedure and treatment not carried out due to patient leaving prior to being seen by health care provider: Secondary | ICD-10-CM | POA: Diagnosis not present

## 2023-03-26 LAB — COMPREHENSIVE METABOLIC PANEL
ALT: 13 U/L (ref 0–44)
AST: 18 U/L (ref 15–41)
Albumin: 3.7 g/dL (ref 3.5–5.0)
Alkaline Phosphatase: 47 U/L (ref 38–126)
Anion gap: 8 (ref 5–15)
BUN: 25 mg/dL — ABNORMAL HIGH (ref 8–23)
CO2: 23 mmol/L (ref 22–32)
Calcium: 9.4 mg/dL (ref 8.9–10.3)
Chloride: 105 mmol/L (ref 98–111)
Creatinine, Ser: 1.17 mg/dL — ABNORMAL HIGH (ref 0.44–1.00)
GFR, Estimated: 50 mL/min — ABNORMAL LOW (ref >60)
Glucose, Bld: 100 mg/dL — ABNORMAL HIGH (ref 70–99)
Potassium: 4.4 mmol/L (ref 3.5–5.1)
Sodium: 136 mmol/L (ref 135–145)
Total Bilirubin: 0.6 mg/dL (ref 0.3–1.2)
Total Protein: 5.9 g/dL — ABNORMAL LOW (ref 6.5–8.1)

## 2023-03-26 LAB — CBC
HCT: 35.3 % — ABNORMAL LOW (ref 36.0–46.0)
Hemoglobin: 11.5 g/dL — ABNORMAL LOW (ref 12.0–15.0)
MCH: 31.3 pg (ref 26.0–34.0)
MCHC: 32.6 g/dL (ref 30.0–36.0)
MCV: 96.2 fL (ref 80.0–100.0)
Platelets: 158 10*3/uL (ref 150–400)
RBC: 3.67 MIL/uL — ABNORMAL LOW (ref 3.87–5.11)
RDW: 13 % (ref 11.5–15.5)
WBC: 5.8 10*3/uL (ref 4.0–10.5)
nRBC: 0 % (ref 0.0–0.2)

## 2023-03-26 LAB — URINALYSIS, ROUTINE W REFLEX MICROSCOPIC
Bilirubin Urine: NEGATIVE
Glucose, UA: NEGATIVE mg/dL
Hgb urine dipstick: NEGATIVE
Ketones, ur: NEGATIVE mg/dL
Leukocytes,Ua: NEGATIVE
Nitrite: NEGATIVE
Protein, ur: NEGATIVE mg/dL
Specific Gravity, Urine: 1.01 (ref 1.005–1.030)
pH: 6 (ref 5.0–8.0)

## 2023-03-26 LAB — LIPASE, BLOOD: Lipase: 49 U/L (ref 11–51)

## 2023-03-26 NOTE — ED Triage Notes (Signed)
The pt is c/o abd pain for a long time no n v or diarrhea  she reports that she has a heart monitor that is supposed to be removed tomorrow

## 2023-03-26 NOTE — ED Notes (Signed)
Pt came up to Clinical research associate and Web designer than she didn't want to wait any longer and is going home at this time.

## 2023-03-28 ENCOUNTER — Emergency Department (HOSPITAL_COMMUNITY)
Admission: EM | Admit: 2023-03-28 | Discharge: 2023-03-28 | Disposition: A | Discharge status: Home / Self Care | Payer: BC Managed Care – PPO | Attending: Emergency Medicine | Admitting: Emergency Medicine

## 2023-03-28 ENCOUNTER — Other Ambulatory Visit: Payer: Self-pay

## 2023-03-28 ENCOUNTER — Encounter (HOSPITAL_COMMUNITY): Payer: Self-pay | Admitting: *Deleted

## 2023-03-28 DIAGNOSIS — E119 Type 2 diabetes mellitus without complications: Secondary | ICD-10-CM | POA: Insufficient documentation

## 2023-03-28 DIAGNOSIS — R1084 Generalized abdominal pain: Secondary | ICD-10-CM | POA: Insufficient documentation

## 2023-03-28 DIAGNOSIS — Z79899 Other long term (current) drug therapy: Secondary | ICD-10-CM | POA: Diagnosis not present

## 2023-03-28 DIAGNOSIS — Z7901 Long term (current) use of anticoagulants: Secondary | ICD-10-CM | POA: Diagnosis not present

## 2023-03-28 DIAGNOSIS — Z7984 Long term (current) use of oral hypoglycemic drugs: Secondary | ICD-10-CM | POA: Insufficient documentation

## 2023-03-28 DIAGNOSIS — R109 Unspecified abdominal pain: Secondary | ICD-10-CM | POA: Diagnosis present

## 2023-03-28 DIAGNOSIS — I10 Essential (primary) hypertension: Secondary | ICD-10-CM | POA: Diagnosis not present

## 2023-03-28 LAB — CBC WITH DIFFERENTIAL/PLATELET
Abs Immature Granulocytes: 0.01 10*3/uL (ref 0.00–0.07)
Basophils Absolute: 0 10*3/uL (ref 0.0–0.1)
Basophils Relative: 1 %
Eosinophils Absolute: 0 10*3/uL (ref 0.0–0.5)
Eosinophils Relative: 0 %
HCT: 33.3 % — ABNORMAL LOW (ref 36.0–46.0)
Hemoglobin: 11 g/dL — ABNORMAL LOW (ref 12.0–15.0)
Immature Granulocytes: 0 %
Lymphocytes Relative: 30 %
Lymphs Abs: 1.7 10*3/uL (ref 0.7–4.0)
MCH: 31.9 pg (ref 26.0–34.0)
MCHC: 33 g/dL (ref 30.0–36.0)
MCV: 96.5 fL (ref 80.0–100.0)
Monocytes Absolute: 0.3 10*3/uL (ref 0.1–1.0)
Monocytes Relative: 5 %
Neutro Abs: 3.6 10*3/uL (ref 1.7–7.7)
Neutrophils Relative %: 64 %
Platelets: 150 10*3/uL (ref 150–400)
RBC: 3.45 MIL/uL — ABNORMAL LOW (ref 3.87–5.11)
RDW: 13.1 % (ref 11.5–15.5)
WBC: 5.6 10*3/uL (ref 4.0–10.5)
nRBC: 0 % (ref 0.0–0.2)

## 2023-03-28 LAB — COMPREHENSIVE METABOLIC PANEL
ALT: 13 U/L (ref 0–44)
AST: 17 U/L (ref 15–41)
Albumin: 3.4 g/dL — ABNORMAL LOW (ref 3.5–5.0)
Alkaline Phosphatase: 43 U/L (ref 38–126)
Anion gap: 9 (ref 5–15)
BUN: 25 mg/dL — ABNORMAL HIGH (ref 8–23)
CO2: 23 mmol/L (ref 22–32)
Calcium: 9.3 mg/dL (ref 8.9–10.3)
Chloride: 105 mmol/L (ref 98–111)
Creatinine, Ser: 1.2 mg/dL — ABNORMAL HIGH (ref 0.44–1.00)
GFR, Estimated: 49 mL/min — ABNORMAL LOW (ref >60)
Glucose, Bld: 116 mg/dL — ABNORMAL HIGH (ref 70–99)
Potassium: 4.3 mmol/L (ref 3.5–5.1)
Sodium: 137 mmol/L (ref 135–145)
Total Bilirubin: 0.1 mg/dL — ABNORMAL LOW (ref 0.3–1.2)
Total Protein: 5.5 g/dL — ABNORMAL LOW (ref 6.5–8.1)

## 2023-03-28 MED ORDER — DICYCLOMINE HCL 10 MG/ML IM SOLN
20.0000 mg | Freq: Once | INTRAMUSCULAR | Status: AC
  Administered 2023-03-28: 20 mg via INTRAMUSCULAR
  Filled 2023-03-28: qty 2

## 2023-03-28 MED ORDER — DICYCLOMINE HCL 20 MG PO TABS: 20.0000 mg | ORAL_TABLET | Freq: Two times a day (BID) | ORAL | 0 refills | Status: DC

## 2023-03-28 MED ORDER — ONDANSETRON HCL 4 MG/2ML IJ SOLN
4.0000 mg | Freq: Once | INTRAMUSCULAR | Status: AC
  Administered 2023-03-28: 4 mg via INTRAVENOUS
  Filled 2023-03-28: qty 2

## 2023-03-28 MED ORDER — LACTATED RINGERS IV BOLUS
500.0000 mL | Freq: Once | INTRAVENOUS | Status: AC
  Administered 2023-03-28: 500 mL via INTRAVENOUS

## 2023-03-28 MED ORDER — HYDROMORPHONE HCL 1 MG/ML IJ SOLN
1.0000 mg | Freq: Once | INTRAMUSCULAR | Status: AC
  Administered 2023-03-28: 1 mg via INTRAVENOUS
  Filled 2023-03-28: qty 1

## 2023-03-28 NOTE — ED Notes (Addendum)
Pt d/c home per MD order. Discharge summary reviewed with pt, pt verbalizes understanding. D/C home with husband , no s/s of acute distress noted at discharge. Reports husband id discharge ride home

## 2023-03-28 NOTE — ED Provider Notes (Signed)
Rachel Black EMERGENCY DEPARTMENT AT [facility] Provider Note   CSN: [id number] Arrival date & time: 03/28/23  1513     History  Chief Complaint  Patient presents with   Abdominal Pain    Rachel Black is a 72 y.o. female.  72 year old female with chronic abdominal pain presents today for flareup of her abdominal pain.  She was seen in the emergency department 2 days ago but left before being seen.  She has undergone multiple CT scans, ED visits, and has been seen by GI specialist once as well.  She states she does not have an answer for her symptoms.  Husband is at bedside and adds to the history as well.  He is concerned that stress flares up patient's pain.  She denies any nausea, vomiting, dysuria, melanotic stools, hematochezia.  No chest pain or shortness of breath associated with this.  She does endorse some heart fluttering.  However she has undergone cardiology workup including a Zio patch.  This is not her presenting complaint.  The history is provided by the patient. No language interpreter was used.       Home Medications Prior to Admission medications   Medication Sig Start Date End Date Taking? Authorizing Provider  ACCU-CHEK AVIVA PLUS test strip USE AS INSTRUCTED ONCE A DAY. DX CODE E11.9 07/20/22   Mayer Masker, PA-C  Accu-Chek Softclix Lancets lancets Use as instructed once a day.  DX Code: E11.9 03/24/22   Mayer Masker, PA-C  acetaminophen (TYLENOL) 500 MG tablet Take 1,000 mg by mouth every 6 (six) hours as needed for mild pain or moderate pain.    [provider]  allopurinol (ZYLOPRIM) 300 MG tablet TAKE 1 TABLET BY MOUTH TWICE A DAY 03/04/23   Carlean Jews, NP  apixaban (ELIQUIS) 5 MG TABS tablet Take 1 tablet (5 mg total) by mouth 2 (two) times daily. 02/09/23   Saralyn Pilar A, PA  CALCIUM PO Take 2 tablets by mouth daily.    [provider]  dicyclomine (BENTYL) 20 MG tablet Take 1 tablet (20 mg total) by mouth 2 (two)  times daily. 03/04/23   Roxy Horseman, PA-C  Emollient (UDDERLY SMOOTH) CREA Apply 1 application topically See admin instructions. Apply topically to skin folds as needed for rash/irritation    [provider]  famotidine (PEPCID) 10 MG tablet Take 10 mg by mouth daily as needed for heartburn or indigestion.    [provider]  fenofibrate 160 MG tablet TAKE 1 TABLET BY MOUTH EVERY DAY 03/04/23   Carlean Jews, NP  ferrous sulfate 325 (65 FE) MG tablet TAKE 1 TABLET BY MOUTH TWICE A DAY Patient taking differently: Take 325 mg by mouth daily. 08/03/22   Zehr, Princella Pellegrini, PA-C  gabapentin (NEURONTIN) 300 MG capsule Take 1 capsule (300 mg total) by mouth at bedtime. 02/23/23   Gloris Manchester, MD  isosorbide mononitrate (IMDUR) 30 MG 24 hr tablet Take 30 mg by mouth daily. 11/30/22   [provider]  olmesartan (BENICAR) 5 MG tablet TAKE 1 TABLET (5 MG TOTAL) BY MOUTH DAILY. 02/05/23   Carlean Jews, NP  ondansetron (ZOFRAN) 4 MG tablet Take 1 tablet (4 mg total) by mouth every 6 (six) hours. 02/20/23   Loetta Rough, MD  pantoprazole (PROTONIX) 40 MG tablet Take 40 mg by mouth daily. 12/26/22   [provider]  rosuvastatin (CRESTOR) 40 MG tablet TAKE 1 TABLET BY MOUTH EVERY DAY 01/04/23   Rollene Rotunda,  MD  sucralfate (CARAFATE) 1 GM/10ML suspension Take 10 mLs (1 g total) by mouth 2 (two) times daily. 03/15/23   Arnaldo Natal, NP      Allergies    Other and Penicillins    Review of Systems   Review of Systems  Constitutional:  Negative for chills and fever.  Respiratory:  Negative for shortness of breath.   Cardiovascular:  Negative for chest pain.  Gastrointestinal:  Negative for abdominal pain, anal bleeding, blood in stool, nausea and vomiting.  Genitourinary:  Negative for dysuria.  All other systems reviewed and are negative.   Physical Exam Updated Vital Signs BP 105/67   Pulse 74   Temp 98.2 F (36.8 C) (Oral)   Resp 16   Ht 5'  6" (1.676 m)   Wt 80.7 kg   SpO2 93%   BMI 28.72 kg/m  Physical Exam Vitals and nursing note reviewed.  Constitutional:      General: She is not in acute distress.    Appearance: Normal appearance. She is not ill-appearing.  HENT:     Head: Normocephalic and atraumatic.     Nose: Nose normal.  Eyes:     General: No scleral icterus.    Extraocular Movements: Extraocular movements intact.     Conjunctiva/sclera: Conjunctivae normal.  Cardiovascular:     Rate and Rhythm: Normal rate and regular rhythm.     Pulses: Normal pulses.     Heart sounds: Normal heart sounds.  Pulmonary:     Effort: Pulmonary effort is normal. No respiratory distress.     Breath sounds: Normal breath sounds. No wheezing or rales.  Abdominal:     General: There is no distension.     Palpations: Abdomen is soft.     Tenderness: There is abdominal tenderness (epigastric region). There is no guarding.  Musculoskeletal:        General: Normal range of motion.     Cervical back: Normal range of motion.  Skin:    General: Skin is warm and dry.  Neurological:     General: No focal deficit present.     Mental Status: She is alert. Mental status is at baseline.     ED Results / Procedures / Treatments   Labs (all labs ordered are listed, but only abnormal results are displayed) Labs Reviewed  CBC WITH DIFFERENTIAL/PLATELET  COMPREHENSIVE METABOLIC PANEL    EKG None  Radiology No results found.  Procedures Procedures    Medications Ordered in ED Medications  lactated ringers bolus 500 mL (has no administration in time range)  ondansetron (ZOFRAN) injection 4 mg (has no administration in time range)  HYDROmorphone (DILAUDID) injection 1 mg (has no administration in time range)  dicyclomine (BENTYL) injection 20 mg (has no administration in time range)    ED Course/ Medical Decision Making/ A&P                             Medical Decision Making Amount and/or Complexity of Data  Reviewed Labs: ordered.  Risk Prescription drug management.   Medical Decision Making / ED Course   This patient presents to the ED for concern of abdominal pain, this involves an extensive number of treatment options, and is a complaint that carries with it a high risk of complications and morbidity.  The differential diagnosis includes colitis, diverticulitis, IBS, IBD, constipation, chronic abdominal pain  MDM: 72 year old female with past medical history as above presents today  for flareup of her chronic abdominal pain.  Has frequently been seen in the emergency department for similar complaints.  Had extensive amount of CT imaging done without any acute process on the prior studies.  Abdominal exam is without signs of acute abdomen.  Do not feel repeat imaging is warranted at this time.  Will initiate workup with blood work.  Will provide pain control, fluid bolus.  Will reevaluate.  Discussed with attending.  Attending evaluated patient at bedside.  Blood work overall reassuring.  Anemia is at baseline.  No leukocytosis.  Preserved renal function.  On reevaluation patient reports improvement in abdominal pain.  She is appropriate for discharge.  Discharged in stable condition.  Strict return precautions discussed.  Patient was understanding and is in plan.   Lab Tests: -I ordered, reviewed, and interpreted labs.   The pertinent results include:   Labs Reviewed  CBC WITH DIFFERENTIAL/PLATELET - Abnormal; Notable for the following components:      Result Value   RBC 3.45 (*)    Hemoglobin 11.0 (*)    HCT 33.3 (*)    All other components within normal limits  COMPREHENSIVE METABOLIC PANEL      EKG  EKG Interpretation Date/Time:    Ventricular Rate:    PR Interval:    QRS Duration:    QT Interval:    QTC Calculation:   R Axis:      Text Interpretation:          Medicines ordered and prescription drug management: Meds ordered this encounter  Medications   lactated  ringers bolus 500 mL   ondansetron (ZOFRAN) injection 4 mg   HYDROmorphone (DILAUDID) injection 1 mg   dicyclomine (BENTYL) injection 20 mg    -I have reviewed the patients home medicines and have made adjustments as needed   Cardiac Monitoring: The patient was maintained on a cardiac monitor.  I personally viewed and interpreted the cardiac monitored which showed an underlying rhythm of: Normal sinus rhythm to sinus bradycardia  Reevaluation: After the interventions noted above, I reevaluated the patient and found that they have :improved  Co morbidities that complicate the patient evaluation  Past Medical History:  Diagnosis Date   Arthritis    PAIN AND OA BOTH KNEES AND SHOULDERS AND ELBOWS AND WRIST   Blood transfusion without reported diagnosis 2018   after cholecystectomy   Broken foot, right, closed, initial encounter 09/15/2021   no surgery needed, fell at home and went to ED   Diabetes mellitus    ORAL MEDICATION   Diverticulitis    GERD (gastroesophageal reflux disease)    Gout    NO RECENT FLARE UPS   H/O: rheumatic fever    AS A CHILD - NO KNOWN HEART MURMUR OR HEART PROBLEMS   Heart attack (HCC)    Hyperlipidemia    Hypertension    PFO (patent foramen ovale)    Stroke (HCC) 03/2017      Dispostion: Patient is appropriate for discharge.  Discharged in stable condition.  Return precaution discussed.  Patient voices understanding and is in agreement with plan.  Final Clinical Impression(s) / ED Diagnoses Final diagnoses:  Generalized abdominal pain    Rx / DC Orders ED Discharge Orders          Ordered    dicyclomine (BENTYL) 20 MG tablet  2 times daily        03/28/23 1855              Karie Mainland,  Khalila Buechner, PA-C 03/28/23 1900    Derwood Kaplan, MD 03/30/23 [phone]

## 2023-03-28 NOTE — Discharge Instructions (Signed)
Your workup was reassuring.  Follow-up with your gastroenterologist.  For any concerning symptoms return to the emergency room.

## 2023-03-28 NOTE — ED Triage Notes (Signed)
The pt is c/o abd pain noi n or v  she is here in this ed frequently  she was just her 2-3 days for the same no distress

## 2023-03-30 ENCOUNTER — Emergency Department (HOSPITAL_COMMUNITY)
Admission: EM | Admit: 2023-03-30 | Discharge: 2023-03-30 | Disposition: A | Discharge status: Home / Self Care | Payer: BC Managed Care – PPO | Attending: Emergency Medicine | Admitting: Emergency Medicine

## 2023-03-30 ENCOUNTER — Emergency Department (HOSPITAL_COMMUNITY): Payer: BC Managed Care – PPO

## 2023-03-30 ENCOUNTER — Other Ambulatory Visit: Payer: Self-pay

## 2023-03-30 ENCOUNTER — Encounter (HOSPITAL_COMMUNITY): Payer: Self-pay

## 2023-03-30 DIAGNOSIS — R002 Palpitations: Secondary | ICD-10-CM

## 2023-03-30 DIAGNOSIS — I491 Atrial premature depolarization: Secondary | ICD-10-CM | POA: Diagnosis not present

## 2023-03-30 DIAGNOSIS — Z7901 Long term (current) use of anticoagulants: Secondary | ICD-10-CM | POA: Insufficient documentation

## 2023-03-30 DIAGNOSIS — G8929 Other chronic pain: Secondary | ICD-10-CM | POA: Insufficient documentation

## 2023-03-30 DIAGNOSIS — R1084 Generalized abdominal pain: Secondary | ICD-10-CM | POA: Diagnosis not present

## 2023-03-30 LAB — URINALYSIS, ROUTINE W REFLEX MICROSCOPIC
Bilirubin Urine: NEGATIVE
Glucose, UA: NEGATIVE mg/dL
Hgb urine dipstick: NEGATIVE
Ketones, ur: NEGATIVE mg/dL
Leukocytes,Ua: NEGATIVE
Nitrite: NEGATIVE
Protein, ur: NEGATIVE mg/dL
Specific Gravity, Urine: 1.015 (ref 1.005–1.030)
pH: 7 (ref 5.0–8.0)

## 2023-03-30 LAB — BASIC METABOLIC PANEL
Anion gap: 10 (ref 5–15)
BUN: 23 mg/dL (ref 8–23)
CO2: 22 mmol/L (ref 22–32)
Calcium: 9.4 mg/dL (ref 8.9–10.3)
Chloride: 106 mmol/L (ref 98–111)
Creatinine, Ser: 1.13 mg/dL — ABNORMAL HIGH (ref 0.44–1.00)
GFR, Estimated: 52 mL/min — ABNORMAL LOW (ref >60)
Glucose, Bld: 106 mg/dL — ABNORMAL HIGH (ref 70–99)
Potassium: 4.1 mmol/L (ref 3.5–5.1)
Sodium: 138 mmol/L (ref 135–145)

## 2023-03-30 LAB — TROPONIN I (HIGH SENSITIVITY)
Troponin I (High Sensitivity): 10 ng/L (ref <18)
Troponin I (High Sensitivity): 8 ng/L (ref <18)

## 2023-03-30 LAB — CBC
HCT: 34.7 % — ABNORMAL LOW (ref 36.0–46.0)
Hemoglobin: 11.4 g/dL — ABNORMAL LOW (ref 12.0–15.0)
MCH: 32 pg (ref 26.0–34.0)
MCHC: 32.9 g/dL (ref 30.0–36.0)
MCV: 97.5 fL (ref 80.0–100.0)
Platelets: 162 10*3/uL (ref 150–400)
RBC: 3.56 MIL/uL — ABNORMAL LOW (ref 3.87–5.11)
RDW: 13 % (ref 11.5–15.5)
WBC: 4.9 10*3/uL (ref 4.0–10.5)
nRBC: 0 % (ref 0.0–0.2)

## 2023-03-30 LAB — LIPASE, BLOOD: Lipase: 51 U/L (ref 11–51)

## 2023-03-30 MED ORDER — HYDROCODONE-ACETAMINOPHEN 5-325 MG PO TABS
1.0000 | ORAL_TABLET | Freq: Once | ORAL | Status: AC
  Administered 2023-03-30: 1 via ORAL
  Filled 2023-03-30: qty 1

## 2023-03-30 NOTE — Discharge Instructions (Signed)
You were seen for your abdominal pain in the emergency department.   At home, please take Tylenol and Bentyl for your pain.    Check your MyChart online for the results of any tests that had not resulted by the time you left the emergency department.   Follow-up with your primary doctor in 2-3 days regarding your visit.  Follow-up with your GI doctor soon as possible.  Return immediately to the emergency department if you experience any of the following: Severe pain, fevers, or any other concerning symptoms.    Thank you for visiting our Emergency Department. It was a pleasure taking care of you today.

## 2023-03-30 NOTE — ED Triage Notes (Addendum)
Pt c/o generalized abd pain and central fluttering cp x 1 year; states worse today; seen multiple times recently for same; no n/v/d, endorses some sob, denies fevers; pt alert, oriented, NAD in triage

## 2023-03-30 NOTE — ED Provider Notes (Incomplete)
[place] EMERGENCY DEPARTMENT AT [facility] Provider Note   CSN: [id number] Arrival date & time: 03/30/23  1718     History {Add pertinent medical, surgical, social history, OB history to HPI:1} Chief Complaint  Patient presents with   Abdominal Pain    Rachel Black is a 72 y.o. female.  72 year old female with a history of chronic abdominal pain, C-section, and hernia repair who presents to the emergency department with generalized abdominal pain.  Over the past year has been having abdominal pain of unclear etiology.  Has undergone multiple CT scans.  Has been to the emergency department on 7/1, 7/5, 7/12, and 7/14 for the same complaints.  Did have a CT scan on 03/15/2023 that showed diverticulosis without diverticulitis or any other acute findings.  Denies any nausea, vomiting, diarrhea.  Has been having normal bowel movements and is still passing gas.  Says that she came back today because the pain persisted.  Has been trying Bentyl and an acids at home.  Has seen her primary doctor and GI doctor and is frustrated because she does not have any answers as to what is causing her pain.  On chart review has already had 12 CT scans of her abdomen this year.  Also reports occasional palpitations.  No chest pain or significant shortness of breath.  Says that she just mailed off a Zio patch from cardiology.       Home Medications Prior to Admission medications   Medication Sig Start Date End Date Taking? Authorizing Provider  ACCU-CHEK AVIVA PLUS test strip USE AS INSTRUCTED ONCE A DAY. DX CODE E11.9 07/20/22   Mayer Masker, PA-C  Accu-Chek Softclix Lancets lancets Use as instructed once a day.  DX Code: E11.9 03/24/22   Mayer Masker, PA-C  acetaminophen (TYLENOL) 500 MG tablet Take 1,000 mg by mouth every 6 (six) hours as needed for mild pain or moderate pain.    [provider]  allopurinol (ZYLOPRIM) 300 MG tablet TAKE 1 TABLET BY MOUTH TWICE A DAY 03/04/23    Carlean Jews, NP  apixaban (ELIQUIS) 5 MG TABS tablet Take 1 tablet (5 mg total) by mouth 2 (two) times daily. 02/09/23   Saralyn Pilar A, PA  CALCIUM PO Take 2 tablets by mouth daily.    [provider]  dicyclomine (BENTYL) 20 MG tablet Take 1 tablet (20 mg total) by mouth 2 (two) times daily. 03/28/23   Karie Mainland, Amjad, PA-C  Emollient (UDDERLY SMOOTH) CREA Apply 1 application topically See admin instructions. Apply topically to skin folds as needed for rash/irritation    [provider]  famotidine (PEPCID) 10 MG tablet Take 10 mg by mouth daily as needed for heartburn or indigestion.    [provider]  fenofibrate 160 MG tablet TAKE 1 TABLET BY MOUTH EVERY DAY 03/04/23   Carlean Jews, NP  ferrous sulfate 325 (65 FE) MG tablet TAKE 1 TABLET BY MOUTH TWICE A DAY Patient taking differently: Take 325 mg by mouth daily. 08/03/22   Zehr, Princella Pellegrini, PA-C  gabapentin (NEURONTIN) 300 MG capsule Take 1 capsule (300 mg total) by mouth at bedtime. 02/23/23   Gloris Manchester, MD  isosorbide mononitrate (IMDUR) 30 MG 24 hr tablet Take 30 mg by mouth daily. 11/30/22   [provider]  olmesartan (BENICAR) 5 MG tablet TAKE 1 TABLET (5 MG TOTAL) BY MOUTH DAILY. 02/05/23   Carlean Jews, NP  ondansetron (ZOFRAN) 4 MG tablet Take 1 tablet (4 mg  total) by mouth every 6 (six) hours. 02/20/23   Loetta Rough, MD  pantoprazole (PROTONIX) 40 MG tablet Take 40 mg by mouth daily. 12/26/22   [provider]  rosuvastatin (CRESTOR) 40 MG tablet TAKE 1 TABLET BY MOUTH EVERY DAY 01/04/23   Rollene Rotunda, MD  sucralfate (CARAFATE) 1 GM/10ML suspension Take 10 mLs (1 g total) by mouth 2 (two) times daily. 03/15/23   Arnaldo Natal, NP      Allergies    Other and Penicillins    Review of Systems   Review of Systems  Physical Exam Updated Vital Signs BP 107/68   Pulse 67   Temp 98.5 F (36.9 C)   Resp 16   SpO2 97%  Physical Exam Vitals and nursing note  reviewed.  Constitutional:      General: She is not in acute distress.    Appearance: She is well-developed.  HENT:     Head: Normocephalic and atraumatic.     Right Ear: External ear normal.     Left Ear: External ear normal.     Nose: Nose normal.  Eyes:     Extraocular Movements: Extraocular movements intact.     Conjunctiva/sclera: Conjunctivae normal.     Pupils: Pupils are equal, round, and reactive to light.  Cardiovascular:     Rate and Rhythm: Normal rate. Rhythm irregular.     Heart sounds: No murmur heard.    Comments: Frequent PVCs on the monitor Pulmonary:     Effort: Pulmonary effort is normal. No respiratory distress.     Breath sounds: Normal breath sounds.  Abdominal:     General: Abdomen is flat. There is no distension.     Palpations: Abdomen is soft. There is no mass.     Tenderness: There is no abdominal tenderness. There is no guarding.  Musculoskeletal:     Cervical back: Normal range of motion and neck supple.     Right lower leg: No edema.     Left lower leg: No edema.  Skin:    General: Skin is warm and dry.  Neurological:     Mental Status: She is alert and oriented to person, place, and time. Mental status is at baseline.  Psychiatric:        Mood and Affect: Mood normal.     ED Results / Procedures / Treatments   Labs (all labs ordered are listed, but only abnormal results are displayed) Labs Reviewed  CBC - Abnormal; Notable for the following components:      Result Value   RBC 3.56 (*)    Hemoglobin 11.4 (*)    HCT 34.7 (*)    All other components within normal limits  URINALYSIS, ROUTINE W REFLEX MICROSCOPIC  BASIC METABOLIC PANEL  LIPASE, BLOOD  TROPONIN I (HIGH SENSITIVITY)    EKG None  Radiology No results found.  Procedures Procedures  {Document cardiac monitor, telemetry assessment procedure when appropriate:1}  Medications Ordered in ED Medications - No data to display  ED Course/ Medical Decision Making/ A&P    {   Click here for ABCD2, HEART and other calculatorsREFRESH Note before signing :1}                          Medical Decision Making Amount and/or Complexity of Data Reviewed Labs: ordered. Radiology: ordered.  Risk Prescription drug management.   ***  {Document critical care time when appropriate:1} {Document review of labs and clinical decision  tools ie heart score, Chads2Vasc2 etc:1}  {Document your independent review of radiology images, and any outside records:1} {Document your discussion with family members, caretakers, and with consultants:1} {Document social determinants of health affecting pt's care:1} {Document your decision making why or why not admission, treatments were needed:1} Final Clinical Impression(s) / ED Diagnoses Final diagnoses:  None    Rx / DC Orders ED Discharge Orders     None

## 2023-04-01 ENCOUNTER — Emergency Department (HOSPITAL_COMMUNITY)
Admission: EM | Admit: 2023-04-01 | Discharge: 2023-04-01 | Disposition: A | Discharge status: Home / Self Care | Payer: BC Managed Care – PPO | Attending: Emergency Medicine | Admitting: Emergency Medicine

## 2023-04-01 ENCOUNTER — Other Ambulatory Visit: Payer: Self-pay

## 2023-04-01 ENCOUNTER — Encounter (HOSPITAL_COMMUNITY): Payer: Self-pay

## 2023-04-01 ENCOUNTER — Emergency Department (HOSPITAL_COMMUNITY): Payer: BC Managed Care – PPO

## 2023-04-01 DIAGNOSIS — G8929 Other chronic pain: Secondary | ICD-10-CM | POA: Insufficient documentation

## 2023-04-01 DIAGNOSIS — R109 Unspecified abdominal pain: Secondary | ICD-10-CM | POA: Insufficient documentation

## 2023-04-01 DIAGNOSIS — Z7901 Long term (current) use of anticoagulants: Secondary | ICD-10-CM | POA: Diagnosis not present

## 2023-04-01 LAB — CBC
HCT: 36.1 % (ref 36.0–46.0)
Hemoglobin: 11.8 g/dL — ABNORMAL LOW (ref 12.0–15.0)
MCH: 31.4 pg (ref 26.0–34.0)
MCHC: 32.7 g/dL (ref 30.0–36.0)
MCV: 96 fL (ref 80.0–100.0)
Platelets: 184 10*3/uL (ref 150–400)
RBC: 3.76 MIL/uL — ABNORMAL LOW (ref 3.87–5.11)
RDW: 13.1 % (ref 11.5–15.5)
WBC: 5.4 10*3/uL (ref 4.0–10.5)
nRBC: 0 % (ref 0.0–0.2)

## 2023-04-01 LAB — URINALYSIS, ROUTINE W REFLEX MICROSCOPIC
Bilirubin Urine: NEGATIVE
Glucose, UA: NEGATIVE mg/dL
Hgb urine dipstick: NEGATIVE
Ketones, ur: NEGATIVE mg/dL
Leukocytes,Ua: NEGATIVE
Nitrite: NEGATIVE
Protein, ur: NEGATIVE mg/dL
Specific Gravity, Urine: 1.014 (ref 1.005–1.030)
pH: 6 (ref 5.0–8.0)

## 2023-04-01 LAB — TROPONIN I (HIGH SENSITIVITY)
Troponin I (High Sensitivity): 12 ng/L (ref <18)
Troponin I (High Sensitivity): 12 ng/L (ref <18)

## 2023-04-01 LAB — BASIC METABOLIC PANEL
Anion gap: 16 — ABNORMAL HIGH (ref 5–15)
BUN: 22 mg/dL (ref 8–23)
CO2: 22 mmol/L (ref 22–32)
Calcium: 9.8 mg/dL (ref 8.9–10.3)
Chloride: 101 mmol/L (ref 98–111)
Creatinine, Ser: 1.09 mg/dL — ABNORMAL HIGH (ref 0.44–1.00)
GFR, Estimated: 55 mL/min — ABNORMAL LOW (ref >60)
Glucose, Bld: 104 mg/dL — ABNORMAL HIGH (ref 70–99)
Potassium: 4.1 mmol/L (ref 3.5–5.1)
Sodium: 139 mmol/L (ref 135–145)

## 2023-04-01 LAB — HEPATIC FUNCTION PANEL
ALT: 14 U/L (ref 0–44)
AST: 16 U/L (ref 15–41)
Albumin: 3.6 g/dL (ref 3.5–5.0)
Alkaline Phosphatase: 49 U/L (ref 38–126)
Bilirubin, Direct: <0.1 mg/dL (ref 0.0–0.2)
Total Bilirubin: 0.6 mg/dL (ref 0.3–1.2)
Total Protein: 5.9 g/dL — ABNORMAL LOW (ref 6.5–8.1)

## 2023-04-01 LAB — LIPASE, BLOOD: Lipase: 40 U/L (ref 11–51)

## 2023-04-01 MED ORDER — LIDOCAINE VISCOUS HCL 2 % MT SOLN
15.0000 mL | Freq: Once | OROMUCOSAL | Status: AC
  Administered 2023-04-01: 15 mL via ORAL
  Filled 2023-04-01: qty 15

## 2023-04-01 MED ORDER — ALUM & MAG HYDROXIDE-SIMETH 200-200-20 MG/5ML PO SUSP
30.0000 mL | Freq: Once | ORAL | Status: AC
  Administered 2023-04-01: 30 mL via ORAL
  Filled 2023-04-01: qty 30

## 2023-04-01 NOTE — ED Triage Notes (Signed)
Pt c/o non radiating midsternal chest painx67mos. Generalized abdominal painx1 yr. Pt c/o dizzinessx2d. Pt deniea N/V/SOB.

## 2023-04-01 NOTE — Discharge Instructions (Signed)
Please follow-up with the GI specialist hypertension for your your previous GI specialist in regards to recent symptoms and ER visit.  Today your labs are reassuring however you will need a GI specialist for long-term management of your chronic abdominal pain.  If symptoms change or worsen please return to ER.

## 2023-04-01 NOTE — ED Provider Notes (Addendum)
[place] EMERGENCY DEPARTMENT AT [facility] Provider Note   CSN: [id number] Arrival date & time: 04/01/23  1706     History  Chief Complaint  Patient presents with   Abdominal Pain   Chest Pain    Rachel Black is a 72 y.o. female history of chronic domino pain, diabetes, gout and NSTEMI, CVA, status post hysterectomy/cholecystectomy presenting with abdominal pain is present for the past year.  Patient has been seen on July 1/10/19/10/14/16 for the same chief concern.  Patient states that she went to go see Dr. Marina Goodell for GI and only went once but is not back.  Patient is unsure what makes pain worse or better.  Husband was present to assist in history and states that that patient was well this morning however around dinnertime patient began having abdominal pain again.  Husband states that he thinks stress is causing her abdominal pain is when she gets more stressed she begins to have abdominal pain.  Patient denied chest pain, shortness of breath, change in sensation smokes close, fevers, change in the symptoms since being previously seen, nausea/vomiting, back pain, skin color changes or rashes, med changes  Home Medications Prior to Admission medications   Medication Sig Start Date End Date Taking? Authorizing Provider  acetaminophen (TYLENOL) 500 MG tablet Take 1,000 mg by mouth every 6 (six) hours as needed for mild pain or moderate pain.   Yes [provider]  allopurinol (ZYLOPRIM) 300 MG tablet TAKE 1 TABLET BY MOUTH TWICE A DAY 03/04/23  Yes Boscia, Heather E, NP  apixaban (ELIQUIS) 5 MG TABS tablet Take 1 tablet (5 mg total) by mouth 2 (two) times daily. 02/09/23  Yes Saralyn Pilar A, PA  CALCIUM PO Take 2 tablets by mouth daily.   Yes [provider]  dicyclomine (BENTYL) 20 MG tablet Take 1 tablet (20 mg total) by mouth 2 (two) times daily. 03/28/23  Yes Ali, Amjad, PA-C  Emollient (UDDERLY SMOOTH) CREA Apply 1 application topically See admin  instructions. Apply topically to skin folds as needed for rash/irritation   Yes [provider]  famotidine (PEPCID) 10 MG tablet Take 10 mg by mouth daily as needed for heartburn or indigestion.   Yes [provider]  fenofibrate 160 MG tablet TAKE 1 TABLET BY MOUTH EVERY DAY 03/04/23  Yes Boscia, Heather E, NP  ferrous sulfate 325 (65 FE) MG tablet TAKE 1 TABLET BY MOUTH TWICE A DAY Patient taking differently: Take 325 mg by mouth every evening. 08/03/22  Yes Zehr, Princella Pellegrini, PA-C  gabapentin (NEURONTIN) 300 MG capsule Take 1 capsule (300 mg total) by mouth at bedtime. 02/23/23  Yes Gloris Manchester, MD  isosorbide mononitrate (IMDUR) 30 MG 24 hr tablet Take 30 mg by mouth daily. 11/30/22  Yes [provider]  olmesartan (BENICAR) 5 MG tablet TAKE 1 TABLET (5 MG TOTAL) BY MOUTH DAILY. 02/05/23  Yes Boscia, Kathlynn Grate, NP  polyethylene glycol (MIRALAX / GLYCOLAX) 17 g packet Take 17 g by mouth daily as needed for moderate constipation.   Yes [provider]  rosuvastatin (CRESTOR) 40 MG tablet TAKE 1 TABLET BY MOUTH EVERY DAY 01/04/23  Yes Rollene Rotunda, MD  sucralfate (CARAFATE) 1 GM/10ML suspension Take 10 mLs (1 g total) by mouth 2 (two) times daily. 03/15/23  Yes Kennedy-Smith, Malachi Carl, NP  ACCU-CHEK AVIVA PLUS test strip USE AS INSTRUCTED ONCE A DAY. DX CODE E11.9 07/20/22   Mayer Masker, PA-C  Accu-Chek Softclix Lancets lancets Use  as instructed once a day.  DX Code: E11.9 03/24/22   Abonza, Kandis Cocking, PA-C  ondansetron (ZOFRAN) 4 MG tablet Take 1 tablet (4 mg total) by mouth every 6 (six) hours. 02/20/23   Loetta Rough, MD  pantoprazole (PROTONIX) 40 MG tablet Take 40 mg by mouth daily. 12/26/22   [provider]      Allergies    Other and Penicillins    Review of Systems   Review of Systems  Cardiovascular:  Positive for chest pain.  Gastrointestinal:  Positive for abdominal pain.    Physical Exam Updated Vital Signs BP (!) 135/106 (BP  Location: Left Arm)   Pulse (!) 57   Temp 98.3 F (36.8 C) (Oral)   Resp 18   Ht 5\' 6"  (1.676 m)   Wt 80.7 kg   SpO2 100%   BMI 28.72 kg/m  Physical Exam Vitals reviewed.  Constitutional:      General: She is not in acute distress. HENT:     Head: Normocephalic and atraumatic.  Eyes:     Extraocular Movements: Extraocular movements intact.     Conjunctiva/sclera: Conjunctivae normal.     Pupils: Pupils are equal, round, and reactive to light.  Cardiovascular:     Rate and Rhythm: Normal rate and regular rhythm.     Pulses: Normal pulses.     Heart sounds: Normal heart sounds.     Comments: 2+ bilateral radial/dorsalis pedis pulses with regular rate Pulmonary:     Effort: Pulmonary effort is normal. No respiratory distress.     Breath sounds: Normal breath sounds.  Abdominal:     Palpations: Abdomen is soft.     Tenderness: There is no abdominal tenderness. There is no guarding or rebound.  Musculoskeletal:        General: Normal range of motion.     Cervical back: Normal range of motion and neck supple.     Comments: 5 out of 5 bilateral grip/leg extension strength  Skin:    General: Skin is warm and dry.     Capillary Refill: Capillary refill takes less than 2 seconds.  Neurological:     General: No focal deficit present.     Mental Status: She is alert and oriented to person, place, and time.     Comments: Sensation intact in all 4 limbs  Psychiatric:        Mood and Affect: Mood normal.     ED Results / Procedures / Treatments   Labs (all labs ordered are listed, but only abnormal results are displayed) Labs Reviewed  BASIC METABOLIC PANEL - Abnormal; Notable for the following components:      Result Value   Glucose, Bld 104 (*)    Creatinine, Ser 1.09 (*)    GFR, Estimated 55 (*)    Anion gap 16 (*)    All other components within normal limits  CBC - Abnormal; Notable for the following components:   RBC 3.76 (*)    Hemoglobin 11.8 (*)    All other  components within normal limits  HEPATIC FUNCTION PANEL - Abnormal; Notable for the following components:   Total Protein 5.9 (*)    All other components within normal limits  LIPASE, BLOOD  URINALYSIS, ROUTINE W REFLEX MICROSCOPIC  TROPONIN I (HIGH SENSITIVITY)  TROPONIN I (HIGH SENSITIVITY)    EKG None  Radiology DG Chest 2 View  Result Date: 04/01/2023 CLINICAL DATA:  Chest pain. EXAM: CHEST - 2 VIEW COMPARISON:  March 30, 2023.  FINDINGS: The heart size and mediastinal contours are within normal limits. Both lungs are clear. The visualized skeletal structures are unremarkable. IMPRESSION: No active cardiopulmonary disease. Electronically Signed   By: Lupita Raider M.D.   On: 04/01/2023 18:17    Procedures Procedures    Medications Ordered in ED Medications  alum & mag hydroxide-simeth (MAALOX/MYLANTA) 200-200-20 MG/5ML suspension 30 mL (30 mLs Oral Given 04/01/23 2010)    And  lidocaine (XYLOCAINE) 2 % viscous mouth solution 15 mL (15 mLs Oral Given 04/01/23 2010)    ED Course/ Medical Decision Making/ A&P                             Medical Decision Making Amount and/or Complexity of Data Reviewed Labs: ordered. Radiology: ordered.  Risk OTC drugs. Prescription drug management.   [name] 72 y.o. presented today for abdominal pain. Working DDx that I considered at this time includes, but not limited to, gastroenteritis, colitis, small bowel obstruction, appendicitis, cholecystitis, pancreatitis, nephrolithiasis, AAA, UTI, pyelonephritis, ruptured ectopic pregnancy, PID, ovarian torsion.  R/o DDx: gastroenteritis, colitis, small bowel obstruction, appendicitis, cholecystitis, pancreatitis, nephrolithiasis, AAA, UTI, pyelonephritis, ruptured ectopic pregnancy, PID, ovarian torsion: These are considered less likely due to history of present illness and physical exam findings.  Review of prior external notes: 03/30/2023 ED provider  Unique Tests and My  Interpretation:  CBC with differential: Unremarkable BMP: Anion gap 16 however patient is on metformin Lipase: Negative UA: Unremarkable Hepatic function panel: Unremarkable Troponin 12, 12  Discussion with Independent Historian:  Husband  Discussion of Management of Tests: None  Risk: Medium: prescription drug management  Risk Stratification Score: None  Plan: On exam patient was in no acute distress stable vitals.  Patient had unremarkable physical exam.  Triage note states that patient was having chest pain however patient was endorsing abdominal pain with me and states she was not having chest pain.  It appears patient's abdominal pain has been chronic over the past year and she has had multiple visits this year for the same chief concern.  With no changing of symptoms and having a reassuring CT scan on March 15, 2023 a CT scan was not done today.  Patient does have history of multiple abdominal surgeries in the past which may be contributing to her symptoms as well.  Patient's labs from triage are reassuring however a lipase and hepatic function panel were added however I doubt these to be any different from 2 days ago.  Patient will be given GI cocktail for symptoms to see if this helps but will ultimately need to follow-up with a GI specialist to be further evaluated.  Labs came back reassuring.  At this time patient stable to be discharged with outpatient follow-up.  When I went to go discharge the patient patient states that the GI cocktail gave her did help some.  This may indicate that her acid reflux may be playing a role in her chronic abdominal pain.  Patient was given return precautions. Patient stable for discharge at this time.  Patient verbalized understanding of plan.   Final Clinical Impression(s) / ED Diagnoses Final diagnoses:  Chronic abdominal pain    Rx / DC Orders ED Discharge Orders     None        Remi Deter 04/01/23 2159    Arby Barrette, MD 04/02/23 1108

## 2023-04-01 NOTE — ED Notes (Addendum)
EKG completed did not take the EKG do not know the MD that reviewed printed and given to dr Donnald Garre

## 2023-04-03 ENCOUNTER — Other Ambulatory Visit: Payer: Self-pay | Admitting: Internal Medicine

## 2023-04-03 ENCOUNTER — Other Ambulatory Visit: Payer: Self-pay

## 2023-04-03 ENCOUNTER — Emergency Department (HOSPITAL_COMMUNITY)
Admission: EM | Admit: 2023-04-03 | Discharge: 2023-04-03 | Disposition: A | Discharge status: Home / Self Care | Payer: BC Managed Care – PPO | Attending: Emergency Medicine | Admitting: Emergency Medicine

## 2023-04-03 ENCOUNTER — Emergency Department (HOSPITAL_COMMUNITY): Payer: BC Managed Care – PPO

## 2023-04-03 ENCOUNTER — Encounter (HOSPITAL_COMMUNITY): Payer: Self-pay

## 2023-04-03 DIAGNOSIS — R1013 Epigastric pain: Secondary | ICD-10-CM | POA: Insufficient documentation

## 2023-04-03 DIAGNOSIS — G8929 Other chronic pain: Secondary | ICD-10-CM | POA: Diagnosis not present

## 2023-04-03 DIAGNOSIS — Z7901 Long term (current) use of anticoagulants: Secondary | ICD-10-CM | POA: Insufficient documentation

## 2023-04-03 DIAGNOSIS — R112 Nausea with vomiting, unspecified: Secondary | ICD-10-CM

## 2023-04-03 LAB — CBC
HCT: 35.7 % — ABNORMAL LOW (ref 36.0–46.0)
Hemoglobin: 11.7 g/dL — ABNORMAL LOW (ref 12.0–15.0)
MCH: 32.3 pg (ref 26.0–34.0)
MCHC: 32.8 g/dL (ref 30.0–36.0)
MCV: 98.6 fL (ref 80.0–100.0)
Platelets: 181 10*3/uL (ref 150–400)
RBC: 3.62 MIL/uL — ABNORMAL LOW (ref 3.87–5.11)
RDW: 13.4 % (ref 11.5–15.5)
WBC: 5.1 10*3/uL (ref 4.0–10.5)
nRBC: 0 % (ref 0.0–0.2)

## 2023-04-03 LAB — URINALYSIS, ROUTINE W REFLEX MICROSCOPIC
Bilirubin Urine: NEGATIVE
Glucose, UA: NEGATIVE mg/dL
Hgb urine dipstick: NEGATIVE
Ketones, ur: NEGATIVE mg/dL
Leukocytes,Ua: NEGATIVE
Nitrite: NEGATIVE
Protein, ur: NEGATIVE mg/dL
Specific Gravity, Urine: 1.025 (ref 1.005–1.030)
pH: 5 (ref 5.0–8.0)

## 2023-04-03 LAB — COMPREHENSIVE METABOLIC PANEL
ALT: 13 U/L (ref 0–44)
AST: 16 U/L (ref 15–41)
Albumin: 3.6 g/dL (ref 3.5–5.0)
Alkaline Phosphatase: 46 U/L (ref 38–126)
Anion gap: 8 (ref 5–15)
BUN: 26 mg/dL — ABNORMAL HIGH (ref 8–23)
CO2: 24 mmol/L (ref 22–32)
Calcium: 9.3 mg/dL (ref 8.9–10.3)
Chloride: 107 mmol/L (ref 98–111)
Creatinine, Ser: 1.28 mg/dL — ABNORMAL HIGH (ref 0.44–1.00)
GFR, Estimated: 45 mL/min — ABNORMAL LOW (ref >60)
Glucose, Bld: 107 mg/dL — ABNORMAL HIGH (ref 70–99)
Potassium: 3.7 mmol/L (ref 3.5–5.1)
Sodium: 139 mmol/L (ref 135–145)
Total Bilirubin: 0.5 mg/dL (ref 0.3–1.2)
Total Protein: 5.9 g/dL — ABNORMAL LOW (ref 6.5–8.1)

## 2023-04-03 LAB — LIPASE, BLOOD: Lipase: 52 U/L — ABNORMAL HIGH (ref 11–51)

## 2023-04-03 MED ORDER — FAMOTIDINE IN NACL 20-0.9 MG/50ML-% IV SOLN
20.0000 mg | Freq: Once | INTRAVENOUS | Status: AC
  Administered 2023-04-03: 20 mg via INTRAVENOUS
  Filled 2023-04-03: qty 50

## 2023-04-03 MED ORDER — ONDANSETRON HCL 4 MG/2ML IJ SOLN
4.0000 mg | Freq: Once | INTRAMUSCULAR | Status: AC
  Administered 2023-04-03: 4 mg via INTRAVENOUS
  Filled 2023-04-03: qty 2

## 2023-04-03 MED ORDER — ONDANSETRON 4 MG PO TBDP: 4.0000 mg | ORAL_TABLET | Freq: Three times a day (TID) | ORAL | 0 refills | Status: DC | PRN

## 2023-04-03 MED ORDER — MORPHINE SULFATE (PF) 2 MG/ML IV SOLN
2.0000 mg | Freq: Once | INTRAVENOUS | Status: AC
  Administered 2023-04-03: 2 mg via INTRAVENOUS
  Filled 2023-04-03: qty 1

## 2023-04-03 MED ORDER — IOHEXOL 350 MG/ML SOLN
75.0000 mL | Freq: Once | INTRAVENOUS | Status: AC | PRN
  Administered 2023-04-03: 75 mL via INTRAVENOUS

## 2023-04-03 MED ORDER — ACETAMINOPHEN 500 MG PO TABS
1000.0000 mg | ORAL_TABLET | ORAL | Status: AC
  Administered 2023-04-03: 1000 mg via ORAL
  Filled 2023-04-03: qty 2

## 2023-04-03 NOTE — ED Triage Notes (Signed)
Pt arrived POV from home c/o epigastric pain that has been on and off for a long time she states but then came on this morning and has not went away. Pt states this is the worst it has been in a while.

## 2023-04-03 NOTE — ED Provider Notes (Signed)
[place] EMERGENCY DEPARTMENT AT [facility] Provider Note   CSN: [id number] Arrival date & time: 04/03/23  1331     History  Chief Complaint  Patient presents with   Abdominal Pain    Rachel Black is a 72 y.o. female.  72 year old female with a history of chronic abdominal pain, C-section, and hernia repair who presents emergency department epigastric abdominal pain.  Says that she has been having the symptoms for a year but that it worsened in the past 2 days.  Is now having nonbloody nonbilious emesis.  No fevers.  No diarrhea or constipation.  No exacerbating or alleviating factors of her epigastric abdominal pain.  Has been seen here multiple times for similar complaints with multiple CT scans.       Home Medications Prior to Admission medications   Medication Sig Start Date End Date Taking? Authorizing Provider  ondansetron (ZOFRAN-ODT) 4 MG disintegrating tablet Take 1 tablet (4 mg total) by mouth every 8 (eight) hours as needed for nausea or vomiting. 04/03/23  Yes Rondel Baton, MD  ACCU-CHEK AVIVA PLUS test strip USE AS INSTRUCTED ONCE A DAY. DX CODE E11.9 07/20/22   Mayer Masker, PA-C  Accu-Chek Softclix Lancets lancets Use as instructed once a day.  DX Code: E11.9 03/24/22   Mayer Masker, PA-C  acetaminophen (TYLENOL) 500 MG tablet Take 1,000 mg by mouth every 6 (six) hours as needed for mild pain or moderate pain.    [provider]  allopurinol (ZYLOPRIM) 300 MG tablet TAKE 1 TABLET BY MOUTH TWICE A DAY 03/04/23   Carlean Jews, NP  apixaban (ELIQUIS) 5 MG TABS tablet Take 1 tablet (5 mg total) by mouth 2 (two) times daily. 02/09/23   Saralyn Pilar A, PA  CALCIUM PO Take 2 tablets by mouth daily.    [provider]  dicyclomine (BENTYL) 20 MG tablet Take 1 tablet (20 mg total) by mouth 2 (two) times daily. 03/28/23   Karie Mainland, Amjad, PA-C  Emollient (UDDERLY SMOOTH) CREA Apply 1 application topically See admin instructions. Apply  topically to skin folds as needed for rash/irritation    [provider]  famotidine (PEPCID) 10 MG tablet Take 10 mg by mouth daily as needed for heartburn or indigestion.    [provider]  fenofibrate 160 MG tablet TAKE 1 TABLET BY MOUTH EVERY DAY 03/04/23   Carlean Jews, NP  ferrous sulfate 325 (65 FE) MG tablet TAKE 1 TABLET BY MOUTH TWICE A DAY Patient taking differently: Take 325 mg by mouth every evening. 08/03/22   Zehr, Princella Pellegrini, PA-C  gabapentin (NEURONTIN) 300 MG capsule Take 1 capsule (300 mg total) by mouth at bedtime. 02/23/23   Gloris Manchester, MD  isosorbide mononitrate (IMDUR) 30 MG 24 hr tablet Take 30 mg by mouth daily. 11/30/22   [provider]  olmesartan (BENICAR) 5 MG tablet TAKE 1 TABLET (5 MG TOTAL) BY MOUTH DAILY. 02/05/23   Carlean Jews, NP  pantoprazole (PROTONIX) 40 MG tablet Take 40 mg by mouth daily. 12/26/22   [provider]  polyethylene glycol (MIRALAX / GLYCOLAX) 17 g packet Take 17 g by mouth daily as needed for moderate constipation.    [provider]  rosuvastatin (CRESTOR) 40 MG tablet TAKE 1 TABLET BY MOUTH EVERY DAY 01/04/23   Rollene Rotunda, MD  sucralfate (CARAFATE) 1 GM/10ML suspension Take 10 mLs (1 g total) by mouth 2 (two) times daily. 03/15/23   Arnaldo Natal, NP  Allergies    Other and Penicillins    Review of Systems   Review of Systems  Physical Exam Updated Vital Signs BP (!) 133/99 (BP Location: Right Arm)   Pulse (!) 50   Temp 98.2 F (36.8 C) (Oral)   Resp 18   Ht 5\' 6"  (1.676 m)   Wt 77.1 kg   SpO2 100%   BMI 27.44 kg/m  Physical Exam Vitals and nursing note reviewed.  Constitutional:      General: She is not in acute distress.    Appearance: She is well-developed.  HENT:     Head: Normocephalic and atraumatic.     Right Ear: External ear normal.     Left Ear: External ear normal.     Nose: Nose normal.  Eyes:     Extraocular Movements: Extraocular  movements intact.     Conjunctiva/sclera: Conjunctivae normal.     Pupils: Pupils are equal, round, and reactive to light.  Pulmonary:     Effort: Pulmonary effort is normal. No respiratory distress.  Abdominal:     General: Abdomen is flat. There is no distension.     Palpations: Abdomen is soft. There is no mass.     Tenderness: There is abdominal tenderness (Epigastrium). There is no guarding.  Musculoskeletal:     Cervical back: Normal range of motion and neck supple.  Skin:    General: Skin is warm and dry.  Neurological:     Mental Status: She is alert and oriented to person, place, and time. Mental status is at baseline.  Psychiatric:        Mood and Affect: Mood normal.     ED Results / Procedures / Treatments   Labs (all labs ordered are listed, but only abnormal results are displayed) Labs Reviewed  LIPASE, BLOOD - Abnormal; Notable for the following components:      Result Value   Lipase 52 (*)    All other components within normal limits  COMPREHENSIVE METABOLIC PANEL - Abnormal; Notable for the following components:   Glucose, Bld 107 (*)    BUN 26 (*)    Creatinine, Ser 1.28 (*)    Total Protein 5.9 (*)    GFR, Estimated 45 (*)    All other components within normal limits  CBC - Abnormal; Notable for the following components:   RBC 3.62 (*)    Hemoglobin 11.7 (*)    HCT 35.7 (*)    All other components within normal limits  URINALYSIS, ROUTINE W REFLEX MICROSCOPIC - Abnormal; Notable for the following components:   APPearance HAZY (*)    All other components within normal limits    EKG None  Radiology CT ABDOMEN PELVIS W CONTRAST  Result Date: 04/03/2023 CLINICAL DATA:  Epigastric pain, nausea and vomiting. EXAM: CT ABDOMEN AND PELVIS WITH CONTRAST TECHNIQUE: Multidetector CT imaging of the abdomen and pelvis was performed using the standard protocol following bolus administration of intravenous contrast. RADIATION DOSE REDUCTION: This exam was  performed according to the departmental dose-optimization program which includes automated exposure control, adjustment of the mA and/or kV according to patient size and/or use of iterative reconstruction technique. CONTRAST:  75mL OMNIPAQUE IOHEXOL 350 MG/ML SOLN COMPARISON:  CT examination dated March 15, 2023 FINDINGS: Lower chest: No acute abnormality. Hepatobiliary: No focal liver abnormality is seen. Status post cholecystectomy. No biliary dilatation. Pneumobilia, similar to prior examination, suggesting sphincter of Oddi dysfunction. Pancreas: Unremarkable. No pancreatic ductal dilatation or surrounding inflammatory changes. Spleen: Normal in size  without focal abnormality. Adrenals/Urinary Tract: Adrenal glands are unremarkable. Kidneys are normal, without renal calculi, focal lesion, or hydronephrosis. 1.3 cm simple cyst in the interpolar region of the left kidney. Bladder is unremarkable. Stomach/Bowel: Stomach is within normal limits. Appendix not clearly identified. Sigmoid colonic diverticulosis without evidence of acute diverticulitis. No evidence of bowel wall thickening, distention, or inflammatory changes. Vascular/Lymphatic: No significant vascular findings are present. No enlarged abdominal or pelvic lymph nodes. Reproductive: Status post hysterectomy. No adnexal masses. Other: No abdominal wall hernia or abnormality. No abdominopelvic ascites. Musculoskeletal: No acute or significant osseous findings. IMPRESSION: 1. No CT evidence of acute abdominal/pelvic process. 2. Sigmoid colonic diverticulosis without evidence of acute diverticulitis. 3. Status post cholecystectomy. Pneumobilia, similar to prior examination, suggesting sphincter of Oddi dysfunction. Electronically Signed   By: Larose Hires D.O.   On: 04/03/2023 18:26    Procedures Procedures    Medications Ordered in ED Medications  acetaminophen (TYLENOL) tablet 1,000 mg (1,000 mg Oral Given 04/03/23 1719)  ondansetron (ZOFRAN)  injection 4 mg (4 mg Intravenous Given 04/03/23 1719)  morphine (PF) 2 MG/ML injection 2 mg (2 mg Intravenous Given 04/03/23 1720)  famotidine (PEPCID) IVPB 20 mg premix (0 mg Intravenous Stopped 04/03/23 1809)  iohexol (OMNIPAQUE) 350 MG/ML injection 75 mL (75 mLs Intravenous Contrast Given 04/03/23 1800)    ED Course/ Medical Decision Making/ A&P                             Medical Decision Making Amount and/or Complexity of Data Reviewed Labs: ordered. Radiology: ordered.  Risk OTC drugs. Prescription drug management.   Rachel Black is a 72 y.o. female with comorbidities that complicate the patient evaluation including chronic abdominal pain, C-section, and hernia repair who presents emergency department epigastric abdominal pain.   Initial Ddx:  JAHLIYAH TRICE is a 72 y.o. female with comorbidities that complicate the patient evaluation including chronic abdominal pain, C-section, and hernia repair who presents to the emergency department with epigastric abdominal pain and nausea and vomiting  Initial Ddx:  Chronic abdominal pain, bowel obstruction, gastroenteritis, gastritis, cholecystitis  MDM/Course:  Patient presents emergency department with worsening of her chronic abdominal pain.  Has had multiple ED visits recently and CAT scans.  Does have new nausea and vomiting for her.  Did discuss with the patient that I feel this is likely due to her chronic abdominal pain but she feels that it is much different today and is insisting on a CT scan.  CT scan was performed which showed some pneumobilia that is consistent with prior CTs and is concerning for possible sphincter of Oddi dysfunction.  Upon re-evaluation patient did not have any additional vomiting and pain was well-controlled.  Given her reassuring CT scan today we will have her follow-up with GI regarding her symptoms to see if this could be sphincter of Oddi dysfunction causing her pain.  Return precautions discussed prior  to discharge.  Also given a prescription of Zofran to take for nausea and vomiting  This patient presents to the ED for concern of complaints listed in HPI, this involves an extensive number of treatment options, and is a complaint that carries with it a high risk of complications and morbidity. Disposition including potential need for admission considered.   Dispo: DC Home. Return precautions discussed including, but not limited to, those listed in the AVS. Allowed pt time to ask questions which were answered fully prior to dc.  Additional history obtained from spouse Records reviewed ED Visit Notes The following labs were independently interpreted: Chemistry and show no acute abnormality I independently reviewed the following imaging with scope of interpretation limited to determining acute life threatening conditions related to emergency care: CT Abdomen/Pelvis and agree with the radiologist interpretation with the following exceptions: none I personally reviewed and interpreted cardiac monitoring: normal sinus rhythm  I personally reviewed and interpreted the pt's EKG: see above for interpretation  I have reviewed the patients home medications and made adjustments as needed Social Determinants of health:  Elderly        Final Clinical Impression(s) / ED Diagnoses Final diagnoses:  Chronic abdominal pain  Nausea and vomiting, unspecified vomiting type    Rx / DC Orders ED Discharge Orders          Ordered    ondansetron (ZOFRAN-ODT) 4 MG disintegrating tablet  Every 8 hours PRN        04/03/23 1917              Rondel Baton, MD 04/03/23 2118

## 2023-04-03 NOTE — ED Notes (Signed)
Pt reports ongoing chronic generalized abd pain. Denies anything new w/ the abd pain today, but reports some nausea earlier today.

## 2023-04-03 NOTE — Discharge Instructions (Addendum)
You were seen for abdominal pain in the emergency department.   At home, please continue taking your pain medications.  Take the Zofran we have prescribed you for nausea and vomiting.  Check your MyChart online for the results of any tests that had not resulted by the time you left the emergency department.   Follow-up with your primary doctor in 2-3 days regarding your visit.  Follow-up with gastroenterology.  Please talk to them to see if they think it is from a condition called sphincter of oddi dysfunction.   Return immediately to the emergency department if you experience any of the following: Worsening pain, vomiting, or any other concerning symptoms.    Thank you for visiting our Emergency Department. It was a pleasure taking care of you today.

## 2023-04-04 ENCOUNTER — Emergency Department (HOSPITAL_COMMUNITY)
Admission: EM | Admit: 2023-04-04 | Discharge: 2023-04-04 | Disposition: A | Discharge status: Home / Self Care | Payer: BC Managed Care – PPO | Attending: Emergency Medicine | Admitting: Emergency Medicine

## 2023-04-04 ENCOUNTER — Other Ambulatory Visit: Payer: Self-pay

## 2023-04-04 ENCOUNTER — Emergency Department (HOSPITAL_COMMUNITY): Payer: BC Managed Care – PPO

## 2023-04-04 ENCOUNTER — Encounter (HOSPITAL_COMMUNITY): Payer: Self-pay

## 2023-04-04 DIAGNOSIS — R002 Palpitations: Secondary | ICD-10-CM | POA: Insufficient documentation

## 2023-04-04 DIAGNOSIS — R1084 Generalized abdominal pain: Secondary | ICD-10-CM

## 2023-04-04 DIAGNOSIS — R1013 Epigastric pain: Secondary | ICD-10-CM | POA: Diagnosis present

## 2023-04-04 DIAGNOSIS — Z7901 Long term (current) use of anticoagulants: Secondary | ICD-10-CM | POA: Diagnosis not present

## 2023-04-04 LAB — CBC
HCT: 34.8 % — ABNORMAL LOW (ref 36.0–46.0)
Hemoglobin: 11.2 g/dL — ABNORMAL LOW (ref 12.0–15.0)
MCH: 31.5 pg (ref 26.0–34.0)
MCHC: 32.2 g/dL (ref 30.0–36.0)
MCV: 97.8 fL (ref 80.0–100.0)
Platelets: 157 10*3/uL (ref 150–400)
RBC: 3.56 MIL/uL — ABNORMAL LOW (ref 3.87–5.11)
RDW: 13.2 % (ref 11.5–15.5)
WBC: 5 10*3/uL (ref 4.0–10.5)
nRBC: 0 % (ref 0.0–0.2)

## 2023-04-04 LAB — BASIC METABOLIC PANEL
Anion gap: 12 (ref 5–15)
BUN: 22 mg/dL (ref 8–23)
CO2: 24 mmol/L (ref 22–32)
Calcium: 9.5 mg/dL (ref 8.9–10.3)
Chloride: 103 mmol/L (ref 98–111)
Creatinine, Ser: 1.31 mg/dL — ABNORMAL HIGH (ref 0.44–1.00)
GFR, Estimated: 44 mL/min — ABNORMAL LOW (ref >60)
Glucose, Bld: 105 mg/dL — ABNORMAL HIGH (ref 70–99)
Potassium: 4.6 mmol/L (ref 3.5–5.1)
Sodium: 139 mmol/L (ref 135–145)

## 2023-04-04 LAB — TROPONIN I (HIGH SENSITIVITY): Troponin I (High Sensitivity): 9 ng/L (ref <18)

## 2023-04-04 NOTE — ED Triage Notes (Signed)
Pt c/o RUQ, LUQ abdominal pain and non radiating midsternal chest painx1 yr. Pt denies N/V, SOB

## 2023-04-04 NOTE — ED Provider Notes (Signed)
[place] EMERGENCY DEPARTMENT AT [facility] Provider Note   CSN: [id number] Arrival date & time: 04/04/23  1640     History {Add pertinent medical, surgical, social history, OB history to HPI:1} Chief Complaint  Patient presents with   Abdominal Pain   Chest Pain    Rachel Black is a 72 y.o. female.  72 year old female with a history of chronic abdominal pain, C-section, and hernia repair presents emergency department epigastric abdominal pain and palpitations.  This been going on for several months.  Has generalized abdominal pain.  Yesterday was having nonbloody nonbilious emesis but this is resolved.  No diarrhea.  No exacerbating or alleviating factors.  Had a CT scan yesterday that showed possible sphincter of Oddi dysfunction.  Says that she has been having chest discomfort that is difficult to characterize but does have some palpitations.  No shortness of breath.  No exacerbating or alleviating factors including exertion.  No diaphoresis or vomiting.  Just sent in a long-term cardiac monitor to evaluate for arrhythmias.       Home Medications Prior to Admission medications   Medication Sig Start Date End Date Taking? Authorizing Provider  ACCU-CHEK AVIVA PLUS test strip USE AS INSTRUCTED ONCE A DAY. DX CODE E11.9 07/20/22   Mayer Masker, PA-C  Accu-Chek Softclix Lancets lancets Use as instructed once a day.  DX Code: E11.9 03/24/22   Mayer Masker, PA-C  acetaminophen (TYLENOL) 500 MG tablet Take 1,000 mg by mouth every 6 (six) hours as needed for mild pain or moderate pain.    [provider]  allopurinol (ZYLOPRIM) 300 MG tablet TAKE 1 TABLET BY MOUTH TWICE A DAY 03/04/23   Carlean Jews, NP  apixaban (ELIQUIS) 5 MG TABS tablet Take 1 tablet (5 mg total) by mouth 2 (two) times daily. 02/09/23   Saralyn Pilar A, PA  CALCIUM PO Take 2 tablets by mouth daily.    [provider]  dicyclomine (BENTYL) 20 MG tablet Take 1 tablet (20 mg  total) by mouth 2 (two) times daily. 03/28/23   Karie Mainland, Amjad, PA-C  Emollient (UDDERLY SMOOTH) CREA Apply 1 application topically See admin instructions. Apply topically to skin folds as needed for rash/irritation    [provider]  famotidine (PEPCID) 10 MG tablet Take 10 mg by mouth daily as needed for heartburn or indigestion.    [provider]  fenofibrate 160 MG tablet TAKE 1 TABLET BY MOUTH EVERY DAY 03/04/23   Carlean Jews, NP  ferrous sulfate 325 (65 FE) MG tablet TAKE 1 TABLET BY MOUTH TWICE A DAY Patient taking differently: Take 325 mg by mouth every evening. 08/03/22   Zehr, Princella Pellegrini, PA-C  gabapentin (NEURONTIN) 300 MG capsule Take 1 capsule (300 mg total) by mouth at bedtime. 02/23/23   Gloris Manchester, MD  isosorbide mononitrate (IMDUR) 30 MG 24 hr tablet Take 30 mg by mouth daily. 11/30/22   [provider]  olmesartan (BENICAR) 5 MG tablet TAKE 1 TABLET (5 MG TOTAL) BY MOUTH DAILY. 02/05/23   Carlean Jews, NP  ondansetron (ZOFRAN-ODT) 4 MG disintegrating tablet Take 1 tablet (4 mg total) by mouth every 8 (eight) hours as needed for nausea or vomiting. 04/03/23   Rondel Baton, MD  pantoprazole (PROTONIX) 40 MG tablet Take 40 mg by mouth daily. 12/26/22   [provider]  polyethylene glycol (MIRALAX / GLYCOLAX) 17 g packet Take 17 g by mouth daily as needed for moderate constipation.  [provider]  rosuvastatin (CRESTOR) 40 MG tablet TAKE 1 TABLET BY MOUTH EVERY DAY 01/04/23   Rollene Rotunda, MD  sucralfate (CARAFATE) 1 GM/10ML suspension Take 10 mLs (1 g total) by mouth 2 (two) times daily. 03/15/23   Arnaldo Natal, NP      Allergies    Other and Penicillins    Review of Systems   Review of Systems  Physical Exam Updated Vital Signs BP 98/63 (BP Location: Left Arm)   Pulse (!) 54   Temp 99 F (37.2 C)   Resp 17   Ht 5\' 6"  (1.676 m)   Wt 77.1 kg   SpO2 97%   BMI 27.43 kg/m  Physical Exam Vitals and  nursing note reviewed.  Constitutional:      General: She is not in acute distress.    Appearance: She is well-developed.  HENT:     Head: Normocephalic and atraumatic.     Right Ear: External ear normal.     Left Ear: External ear normal.     Nose: Nose normal.  Eyes:     Extraocular Movements: Extraocular movements intact.     Conjunctiva/sclera: Conjunctivae normal.     Pupils: Pupils are equal, round, and reactive to light.  Cardiovascular:     Rate and Rhythm: Normal rate and regular rhythm.     Heart sounds: No murmur heard. Pulmonary:     Effort: Pulmonary effort is normal. No respiratory distress.     Breath sounds: Normal breath sounds.  Abdominal:     General: Abdomen is flat. There is no distension.     Palpations: Abdomen is soft. There is no mass.     Tenderness: There is no abdominal tenderness. There is no guarding.  Musculoskeletal:     Cervical back: Normal range of motion and neck supple.     Right lower leg: No edema.     Left lower leg: No edema.  Skin:    General: Skin is warm and dry.  Neurological:     Mental Status: She is alert and oriented to person, place, and time. Mental status is at baseline.  Psychiatric:        Mood and Affect: Mood normal.     ED Results / Procedures / Treatments   Labs (all labs ordered are listed, but only abnormal results are displayed) Labs Reviewed  BASIC METABOLIC PANEL  CBC  TROPONIN I (HIGH SENSITIVITY)    EKG None  Radiology DG Chest 2 View  Result Date: 04/04/2023 CLINICAL DATA:  Chest pain. EXAM: CHEST - 2 VIEW COMPARISON:  04/01/2023. FINDINGS: Low lung volumes accentuate the pulmonary vasculature and cardiomediastinal silhouette. No consolidation or pulmonary edema. No pleural effusion or pneumothorax. IMPRESSION: Low lung volumes without evidence of acute cardiopulmonary disease. Electronically Signed   By: Orvan Falconer M.D.   On: 04/04/2023 18:45   CT ABDOMEN PELVIS W CONTRAST  Result Date:  04/03/2023 CLINICAL DATA:  Epigastric pain, nausea and vomiting. EXAM: CT ABDOMEN AND PELVIS WITH CONTRAST TECHNIQUE: Multidetector CT imaging of the abdomen and pelvis was performed using the standard protocol following bolus administration of intravenous contrast. RADIATION DOSE REDUCTION: This exam was performed according to the departmental dose-optimization program which includes automated exposure control, adjustment of the mA and/or kV according to patient size and/or use of iterative reconstruction technique. CONTRAST:  75mL OMNIPAQUE IOHEXOL 350 MG/ML SOLN COMPARISON:  CT examination dated March 15, 2023 FINDINGS: Lower chest: No acute abnormality. Hepatobiliary: No focal liver abnormality  is seen. Status post cholecystectomy. No biliary dilatation. Pneumobilia, similar to prior examination, suggesting sphincter of Oddi dysfunction. Pancreas: Unremarkable. No pancreatic ductal dilatation or surrounding inflammatory changes. Spleen: Normal in size without focal abnormality. Adrenals/Urinary Tract: Adrenal glands are unremarkable. Kidneys are normal, without renal calculi, focal lesion, or hydronephrosis. 1.3 cm simple cyst in the interpolar region of the left kidney. Bladder is unremarkable. Stomach/Bowel: Stomach is within normal limits. Appendix not clearly identified. Sigmoid colonic diverticulosis without evidence of acute diverticulitis. No evidence of bowel wall thickening, distention, or inflammatory changes. Vascular/Lymphatic: No significant vascular findings are present. No enlarged abdominal or pelvic lymph nodes. Reproductive: Status post hysterectomy. No adnexal masses. Other: No abdominal wall hernia or abnormality. No abdominopelvic ascites. Musculoskeletal: No acute or significant osseous findings. IMPRESSION: 1. No CT evidence of acute abdominal/pelvic process. 2. Sigmoid colonic diverticulosis without evidence of acute diverticulitis. 3. Status post cholecystectomy. Pneumobilia, similar to  prior examination, suggesting sphincter of Oddi dysfunction. Electronically Signed   By: Larose Hires D.O.   On: 04/03/2023 18:26    Procedures Procedures  {Document cardiac monitor, telemetry assessment procedure when appropriate:1}  Medications Ordered in ED Medications - No data to display  ED Course/ Medical Decision Making/ A&P   {   Click here for ABCD2, HEART and other calculatorsREFRESH Note before signing :1}                          Medical Decision Making Amount and/or Complexity of Data Reviewed Labs: ordered. Radiology: ordered.   ***  {Document critical care time when appropriate:1} {Document review of labs and clinical decision tools ie heart score, Chads2Vasc2 etc:1}  {Document your independent review of radiology images, and any outside records:1} {Document your discussion with family members, caretakers, and with consultants:1} {Document social determinants of health affecting pt's care:1} {Document your decision making why or why not admission, treatments were needed:1} Final Clinical Impression(s) / ED Diagnoses Final diagnoses:  None    Rx / DC Orders ED Discharge Orders     None

## 2023-04-04 NOTE — ED Notes (Signed)
Patient transported to X-ray 

## 2023-04-04 NOTE — Discharge Instructions (Signed)
You were seen for your abdominal pain in the emergency department.   At home, please continue to take the medications you have been prescribed.    Check your MyChart online for the results of any tests that had not resulted by the time you left the emergency department.   Follow-up with your primary doctor in 2-3 days regarding your visit.  Follow-up with your GI doctor soon as possible  Follow-up with the emergency department to develop any concerning symptoms.  Thank you for visiting our Emergency Department. It was a pleasure taking care of you today.

## 2023-04-05 ENCOUNTER — Emergency Department (HOSPITAL_COMMUNITY)
Admission: EM | Admit: 2023-04-05 | Discharge: 2023-04-05 | Disposition: A | Discharge status: Home / Self Care | Payer: BC Managed Care – PPO | Attending: Emergency Medicine | Admitting: Emergency Medicine

## 2023-04-05 ENCOUNTER — Ambulatory Visit
Admission: EM | Admit: 2023-04-05 | Discharge: 2023-04-05 | Disposition: A | Discharge status: Home / Self Care | Payer: BC Managed Care – PPO | Attending: Nurse Practitioner | Admitting: Nurse Practitioner

## 2023-04-05 ENCOUNTER — Encounter (HOSPITAL_COMMUNITY): Payer: Self-pay | Admitting: Emergency Medicine

## 2023-04-05 ENCOUNTER — Emergency Department (HOSPITAL_COMMUNITY): Payer: BC Managed Care – PPO

## 2023-04-05 DIAGNOSIS — G8929 Other chronic pain: Secondary | ICD-10-CM | POA: Diagnosis not present

## 2023-04-05 DIAGNOSIS — Z7901 Long term (current) use of anticoagulants: Secondary | ICD-10-CM | POA: Diagnosis not present

## 2023-04-05 DIAGNOSIS — R079 Chest pain, unspecified: Secondary | ICD-10-CM

## 2023-04-05 DIAGNOSIS — R1010 Upper abdominal pain, unspecified: Secondary | ICD-10-CM | POA: Diagnosis not present

## 2023-04-05 DIAGNOSIS — R1013 Epigastric pain: Secondary | ICD-10-CM | POA: Diagnosis present

## 2023-04-05 DIAGNOSIS — R002 Palpitations: Secondary | ICD-10-CM | POA: Insufficient documentation

## 2023-04-05 DIAGNOSIS — R109 Unspecified abdominal pain: Secondary | ICD-10-CM

## 2023-04-05 LAB — BASIC METABOLIC PANEL
Anion gap: 10 (ref 5–15)
BUN: 20 mg/dL (ref 8–23)
CO2: 22 mmol/L (ref 22–32)
Calcium: 9.6 mg/dL (ref 8.9–10.3)
Chloride: 105 mmol/L (ref 98–111)
Creatinine, Ser: 1.23 mg/dL — ABNORMAL HIGH (ref 0.44–1.00)
GFR, Estimated: 47 mL/min — ABNORMAL LOW (ref >60)
Glucose, Bld: 105 mg/dL — ABNORMAL HIGH (ref 70–99)
Potassium: 4.1 mmol/L (ref 3.5–5.1)
Sodium: 137 mmol/L (ref 135–145)

## 2023-04-05 LAB — HEPATIC FUNCTION PANEL
ALT: 10 U/L (ref 0–44)
AST: 22 U/L (ref 15–41)
Albumin: 3.7 g/dL (ref 3.5–5.0)
Alkaline Phosphatase: 49 U/L (ref 38–126)
Bilirubin, Direct: <0.1 mg/dL (ref 0.0–0.2)
Total Bilirubin: 0.7 mg/dL (ref 0.3–1.2)
Total Protein: 5.8 g/dL — ABNORMAL LOW (ref 6.5–8.1)

## 2023-04-05 LAB — CBC
HCT: 35.1 % — ABNORMAL LOW (ref 36.0–46.0)
Hemoglobin: 11.4 g/dL — ABNORMAL LOW (ref 12.0–15.0)
MCH: 31.4 pg (ref 26.0–34.0)
MCHC: 32.5 g/dL (ref 30.0–36.0)
MCV: 96.7 fL (ref 80.0–100.0)
Platelets: 174 10*3/uL (ref 150–400)
RBC: 3.63 MIL/uL — ABNORMAL LOW (ref 3.87–5.11)
RDW: 13.2 % (ref 11.5–15.5)
WBC: 4.9 10*3/uL (ref 4.0–10.5)
nRBC: 0 % (ref 0.0–0.2)

## 2023-04-05 LAB — TROPONIN I (HIGH SENSITIVITY)
Troponin I (High Sensitivity): 7 ng/L (ref <18)
Troponin I (High Sensitivity): 6 ng/L (ref <18)

## 2023-04-05 LAB — LIPASE, BLOOD: Lipase: 43 U/L (ref 11–51)

## 2023-04-05 NOTE — ED Provider Notes (Signed)
[facility]    CSN: [id number] Arrival date & time: 04/05/23  1537      History   Chief Complaint Chief Complaint  Patient presents with   Chest Pain   Abdominal Pain    HPI Rachel Black is a 72 y.o. female.   Patient presents today for chronic abdominal pain and chest pain.  Reports the chest pain has been ongoing for the past couple of days, but woke up this morning and was much worse.  She reports "I feel like I am going to fall out."  She reports the pain is constant and in the middle of her chest.  History of heart attack, stroke.  Patient was seen in Emergency room last night, reports pain has worsened since that visit.  Reports she has been seen in the emergency room more than 30 times in the past 6 months for the abdominal pain.    Past Medical History:  Diagnosis Date   Arthritis    PAIN AND OA BOTH KNEES AND SHOULDERS AND ELBOWS AND WRIST   Blood transfusion without reported diagnosis 2018   after cholecystectomy   Broken foot, right, closed, initial encounter 09/15/2021   no surgery needed, fell at home and went to ED   Diabetes mellitus    ORAL MEDICATION   Diverticulitis    GERD (gastroesophageal reflux disease)    Gout    NO RECENT FLARE UPS   H/O: rheumatic fever    AS A CHILD - NO KNOWN HEART MURMUR OR HEART PROBLEMS   Heart attack (HCC)    Hyperlipidemia    Hypertension    PFO (patent foramen ovale)    Stroke (HCC) 03/2017    Patient Active Problem List   Diagnosis Date Noted   Anxiety 01/31/2023   Diverticulitis 01/22/2023   Diarrhea 01/14/2023   Colitis 01/14/2023   Acute pulmonary embolism (HCC) 01/14/2023   H/O: CVA (cerebrovascular accident) 01/14/2023   CAD S/P percutaneous coronary angioplasty 01/14/2023   Atopic dermatitis 11/01/2022   Inflammatory polyarthropathy (HCC) 11/01/2022   Palpitations 10/25/2022   Left hip pain 10/04/2022   Epigastric pain 03/16/2022   Irritable bowel syndrome with constipation  03/16/2022   Influenza B 09/16/2021   Cryptogenic stroke (HCC) 01/13/2021   GERD without esophagitis 11/26/2020   Iron deficiency 11/26/2020   Generalized abdominal pain 11/26/2020   Family history of breast cancer in mother 11/27/2019   Cataract of both eyes 11/27/2019   Coronary artery disease involving native coronary artery of native heart without angina pectoris 02/18/2018   NSTEMI (non-ST elevated myocardial infarction) (HCC)    History of rheumatic fever 06/30/2017   PFO (patent foramen ovale)    Cerebrovascular accident (CVA) due to embolism of right middle cerebral artery (HCC) 03/14/2017   Dilation of biliary tract    Elevated LFTs    Choledocholithiasis with obstruction 12/07/2016   AKI (acute kidney injury) (HCC) 12/07/2016   Osteoarthritis of right knee 08/23/2015   Status post total left knee replacement 08/23/2015   Arthritis of knee, right 04/20/2014   Status post total right knee replacement 04/20/2014   Flushing reaction 03/10/2012   Diabetes mellitus (HCC) 02/17/2007   GOUT 02/17/2007   Severe obesity (BMI >= 40) (HCC) 02/17/2007   Mixed hyperlipidemia due to type 2 diabetes mellitus (HCC) 02/17/2007   Hypertension associated with diabetes (HCC) 02/17/2007    Past Surgical History:  Procedure Laterality Date   ABDOMINAL HYSTERECTOMY     CESAREAN SECTION  CHOLECYSTECTOMY N/A 12/09/2016   Procedure: LAPAROSCOPIC CHOLECYSTECTOMY WITH INTRAOPERATIVE CHOLANGIOGRAM;  Surgeon: Almond Lint, MD;  Location: [facility];  Service: General;  Laterality: N/A;   COLONOSCOPY     COLONOSCOPY WITH PROPOFOL N/A 01/30/2013   Procedure: COLONOSCOPY WITH PROPOFOL;  Surgeon: Hilarie Fredrickson, MD;  Location: [facility];  Service: Endoscopy;  Laterality: N/A;   ERCP N/A 12/08/2016   Procedure: ENDOSCOPIC RETROGRADE CHOLANGIOPANCREATOGRAPHY (ERCP);  Surgeon: Meryl Dare, MD;  Location: [facility];  Service: Endoscopy;  Laterality: N/A;   ESOPHAGOGASTRODUODENOSCOPY (EGD) WITH  PROPOFOL N/A 01/30/2013   Procedure: ESOPHAGOGASTRODUODENOSCOPY (EGD) WITH PROPOFOL;  Surgeon: Hilarie Fredrickson, MD;  Location: [facility];  Service: Endoscopy;  Laterality: N/A;   LEFT HEART CATH AND CORONARY ANGIOGRAPHY N/A 10/18/2017   Procedure: LEFT HEART CATH AND CORONARY ANGIOGRAPHY;  Surgeon: [place], Peter M, MD;  Location: [facility] INVASIVE CV LAB;  Service: Cardiovascular;  Laterality: N/A;   LOOP RECORDER INSERTION N/A 03/18/2017   Procedure: Loop Recorder Insertion;  Surgeon: Regan Lemming, MD;  Location: [facility] INVASIVE CV LAB;  Service: Cardiovascular;  Laterality: N/A;   PARTIAL HYSTERECTOMY     TEE WITHOUT CARDIOVERSION N/A 03/16/2017   Procedure: TRANSESOPHAGEAL ECHOCARDIOGRAM (TEE);  Surgeon: Elease Hashimoto Deloris Ping, MD;  Location: [facility] ENDOSCOPY;  Service: Cardiovascular;  Laterality: N/A;   TOTAL KNEE ARTHROPLASTY Right 04/20/2014   Procedure: RIGHT TOTAL KNEE ARTHROPLASTY CONVERTED TO RIGHT KNEE REIMPLANTATION;  Surgeon: Kathryne Hitch, MD;  Location: [facility];  Service: Orthopedics;  Laterality: Right;   TOTAL KNEE ARTHROPLASTY Left 08/23/2015   Procedure: LEFT TOTAL KNEE ARTHROPLASTY;  Surgeon: Kathryne Hitch, MD;  Location: [facility];  Service: Orthopedics;  Laterality: Left;   VENTRAL HERNIA REPAIR     x2    OB History   No obstetric history on file.      Home Medications    Prior to Admission medications   Medication Sig Start Date End Date Taking? Authorizing Provider  ACCU-CHEK AVIVA PLUS test strip USE AS INSTRUCTED ONCE A DAY. DX CODE E11.9 07/20/22   Mayer Masker, PA-C  Accu-Chek Softclix Lancets lancets Use as instructed once a day.  DX Code: E11.9 03/24/22   Mayer Masker, PA-C  acetaminophen (TYLENOL) 500 MG tablet Take 1,000 mg by mouth every 6 (six) hours as needed for mild pain or moderate pain.    [provider]  allopurinol (ZYLOPRIM) 300 MG tablet TAKE 1 TABLET BY MOUTH TWICE A DAY 03/04/23   Carlean Jews, NP  apixaban (ELIQUIS) 5  MG TABS tablet Take 1 tablet (5 mg total) by mouth 2 (two) times daily. 02/09/23   Saralyn Pilar A, PA  CALCIUM PO Take 2 tablets by mouth daily.    [provider]  dicyclomine (BENTYL) 20 MG tablet Take 1 tablet (20 mg total) by mouth 2 (two) times daily. 03/28/23   Karie Mainland, Amjad, PA-C  Emollient (UDDERLY SMOOTH) CREA Apply 1 application topically See admin instructions. Apply topically to skin folds as needed for rash/irritation    [provider]  famotidine (PEPCID) 10 MG tablet Take 10 mg by mouth daily as needed for heartburn or indigestion.    [provider]  fenofibrate 160 MG tablet TAKE 1 TABLET BY MOUTH EVERY DAY 03/04/23   Carlean Jews, NP  ferrous sulfate 325 (65 FE) MG tablet TAKE 1 TABLET BY MOUTH TWICE A DAY Patient taking differently: Take 325 mg by mouth every evening. 08/03/22   Zehr, Princella Pellegrini, PA-C  gabapentin (NEURONTIN) 300  MG capsule Take 1 capsule (300 mg total) by mouth at bedtime. 02/23/23   Gloris Manchester, MD  isosorbide mononitrate (IMDUR) 30 MG 24 hr tablet Take 30 mg by mouth daily. 11/30/22   [provider]  olmesartan (BENICAR) 5 MG tablet TAKE 1 TABLET (5 MG TOTAL) BY MOUTH DAILY. 02/05/23   Carlean Jews, NP  ondansetron (ZOFRAN-ODT) 4 MG disintegrating tablet Take 1 tablet (4 mg total) by mouth every 8 (eight) hours as needed for nausea or vomiting. 04/03/23   Rondel Baton, MD  pantoprazole (PROTONIX) 40 MG tablet TAKE 1 TABLET BY MOUTH EVERY DAY 04/05/23   Hilarie Fredrickson, MD  polyethylene glycol (MIRALAX / GLYCOLAX) 17 g packet Take 17 g by mouth daily as needed for moderate constipation.    [provider]  rosuvastatin (CRESTOR) 40 MG tablet TAKE 1 TABLET BY MOUTH EVERY DAY 01/04/23   Rollene Rotunda, MD  sucralfate (CARAFATE) 1 GM/10ML suspension Take 10 mLs (1 g total) by mouth 2 (two) times daily. 03/15/23   Arnaldo Natal, NP    Family History Family History  Problem Relation Age of Onset    Diabetes Mother    Hypertension Mother        entire family   Breast cancer Mother        diagnosed in her 62's   Stroke Father        CVA   Hyperlipidemia Father        entire family   Diabetes Father    Heart disease Father        No details.  Not at an early age.     Crohn's disease Son    Irritable bowel syndrome Son    Colon cancer Neg Hx    Colon polyps Neg Hx    Esophageal cancer Neg Hx    Stomach cancer Neg Hx    Rectal cancer Neg Hx     Social History Social History   Tobacco Use   Smoking status: Never    Passive exposure: Never   Smokeless tobacco: Never  Vaping Use   Vaping status: Never Used  Substance Use Topics   Alcohol use: Not Currently    Comment: rarely in the past   Drug use: Never     Allergies   Other and Penicillins   Review of Systems Review of Systems Per HPI  Physical Exam Triage Vital Signs ED Triage Vitals [04/05/23 1547]  Encounter Vitals Group     BP 135/81     Systolic BP Percentile      Diastolic BP Percentile      Pulse Rate 80     Resp 13     Temp 98.1 F (36.7 C)     Temp Source Oral     SpO2 97 %     Weight      Height      Head Circumference      Peak Flow      Pain Score 8     Pain Loc      Pain Education      Exclude from Growth Chart    No data found.  Updated Vital Signs BP 135/81 (BP Location: Left Arm)   Pulse 80   Temp 98.1 F (36.7 C) (Oral)   Resp 13   SpO2 97%   Visual Acuity Right Eye Distance:   Left Eye Distance:   Bilateral Distance:    Right Eye Near:   Left Eye  Near:    Bilateral Near:     Physical Exam Vitals and nursing note reviewed.  Constitutional:      General: She is not in acute distress.    Appearance: She is well-developed. She is obese. She is ill-appearing. She is not toxic-appearing.  HENT:     Head: Normocephalic and atraumatic.  Eyes:     Extraocular Movements: Extraocular movements intact.     Pupils: Pupils are equal, round, and reactive to light.   Cardiovascular:     Rate and Rhythm: Normal rate and regular rhythm.  Pulmonary:     Effort: Pulmonary effort is normal. No tachypnea or respiratory distress.     Breath sounds: Normal breath sounds.  Skin:    Coloration: Skin is pale.  Neurological:     Mental Status: She is alert and oriented to person, place, and time.    Examination today abbreviated based on patient's condition  UC Treatments / Results  Labs (all labs ordered are listed, but only abnormal results are displayed) Labs Reviewed - No data to display  EKG   Radiology   Procedures Procedures (including critical care time)  Medications Ordered in UC Medications - No data to display  Initial Impression / Assessment and Plan / UC Course  I have reviewed the triage vital signs and the nursing notes.  Pertinent labs & imaging results that were available during my care of the patient were reviewed by me and considered in my medical decision making (see chart for details).   Patient is ill-appearing, pale, normotensive, afebrile, not tachycardic, not tachypneic, oxygenating well on room air.    1. Chest pain, unspecified type EKG today shows sinus rhythm with PVCs No apparent changes in ST segment or T wave I am concerned with patient's history and she states over and over " I feel like I am going to fall out."   Recommended further evaluation and management in ER  Patient to be transported by ambulance given history and feel like she is going to pass out IV initiated, EMS called and transported patient to emergency room via ambulance   2. Chronic abdominal pain Unclear etiology, has been seen numerous times for same GI appt is scheduled and pending Defer to ER work up   The patient was given the opportunity to ask questions.  All questions answered to their satisfaction.  The patient is in agreement to this plan.    Final Clinical Impressions(s) / UC Diagnoses   Final diagnoses:  Chest pain,  unspecified type  Chronic abdominal pain     Discharge Instructions      We are sending you to the hospital for further evaluation and management of the chest pain    ED Prescriptions   None    PDMP not reviewed this encounter.   Valentino Nose, NP 04/05/23 [phone]

## 2023-04-05 NOTE — ED Triage Notes (Signed)
Pt BIB EMS from Urgent care for complaints of dizziness and chest pain. 1 nitroglycerin and 324 asa given with no relief. Has been seen frequently in the ED for same with no answers.   20 [facility] VS  124/77 Hr 65 99% RA CBG 128

## 2023-04-05 NOTE — ED Triage Notes (Signed)
Pt c/o chest pain, abdominal pain and difficulty breathing, headache has been ongoing for a while but has gotten increasingly worse since this morning

## 2023-04-05 NOTE — ED Notes (Signed)
[facility] notified of request for pt transport.

## 2023-04-05 NOTE — ED Provider Notes (Signed)
[place] EMERGENCY DEPARTMENT AT [facility] Provider Note   CSN: [id number] Arrival date & time: 04/05/23  1706     History {Add pertinent medical, surgical, social history, OB history to HPI:1} Chief Complaint  Patient presents with   Chest Pain    Rachel Black is a 72 y.o. female.  She is here with a complaint of ongoing abdominal pain and palpitations.  She has been seen multiple times and had extensive workup for her abdominal pain.  She follows with GI.  Last CT abdomen was done 2 days ago.  She said she has an appointment with them next month.  She says nobody can figure out why she is having this.  The palpitations come and go.  They seem a little bit better now.  She went to urgent care today and they sent her here by ambulance.  She denies any fevers or chills cough or shortness of breath.  The history is provided by the patient.  Abdominal Pain Pain location:  Epigastric Pain quality: aching   Pain severity:  Severe Onset quality:  Gradual Timing:  Intermittent Progression:  Unchanged Chronicity:  Chronic Relieved by:  None tried Worsened by:  Nothing Ineffective treatments:  None tried Associated symptoms: no chest pain, no constipation, no diarrhea, no dysuria, no fever, no nausea, no shortness of breath and no vomiting        Home Medications Prior to Admission medications   Medication Sig Start Date End Date Taking? Authorizing Provider  ACCU-CHEK AVIVA PLUS test strip USE AS INSTRUCTED ONCE A DAY. DX CODE E11.9 07/20/22   Mayer Masker, PA-C  Accu-Chek Softclix Lancets lancets Use as instructed once a day.  DX Code: E11.9 03/24/22   Mayer Masker, PA-C  acetaminophen (TYLENOL) 500 MG tablet Take 1,000 mg by mouth every 6 (six) hours as needed for mild pain or moderate pain.    [provider]  allopurinol (ZYLOPRIM) 300 MG tablet TAKE 1 TABLET BY MOUTH TWICE A DAY 03/04/23   Carlean Jews, NP  apixaban (ELIQUIS) 5 MG TABS tablet  Take 1 tablet (5 mg total) by mouth 2 (two) times daily. 02/09/23   Saralyn Pilar A, PA  CALCIUM PO Take 2 tablets by mouth daily.    [provider]  dicyclomine (BENTYL) 20 MG tablet Take 1 tablet (20 mg total) by mouth 2 (two) times daily. 03/28/23   Karie Mainland, Amjad, PA-C  Emollient (UDDERLY SMOOTH) CREA Apply 1 application topically See admin instructions. Apply topically to skin folds as needed for rash/irritation    [provider]  famotidine (PEPCID) 10 MG tablet Take 10 mg by mouth daily as needed for heartburn or indigestion.    [provider]  fenofibrate 160 MG tablet TAKE 1 TABLET BY MOUTH EVERY DAY 03/04/23   Carlean Jews, NP  ferrous sulfate 325 (65 FE) MG tablet TAKE 1 TABLET BY MOUTH TWICE A DAY Patient taking differently: Take 325 mg by mouth every evening. 08/03/22   Zehr, Princella Pellegrini, PA-C  gabapentin (NEURONTIN) 300 MG capsule Take 1 capsule (300 mg total) by mouth at bedtime. 02/23/23   Gloris Manchester, MD  isosorbide mononitrate (IMDUR) 30 MG 24 hr tablet Take 30 mg by mouth daily. 11/30/22   [provider]  olmesartan (BENICAR) 5 MG tablet TAKE 1 TABLET (5 MG TOTAL) BY MOUTH DAILY. 02/05/23   Carlean Jews, NP  ondansetron (ZOFRAN-ODT) 4 MG disintegrating tablet Take 1 tablet (4 mg total) by mouth  every 8 (eight) hours as needed for nausea or vomiting. 04/03/23   Rondel Baton, MD  pantoprazole (PROTONIX) 40 MG tablet TAKE 1 TABLET BY MOUTH EVERY DAY 04/05/23   Hilarie Fredrickson, MD  polyethylene glycol (MIRALAX / GLYCOLAX) 17 g packet Take 17 g by mouth daily as needed for moderate constipation.    [provider]  rosuvastatin (CRESTOR) 40 MG tablet TAKE 1 TABLET BY MOUTH EVERY DAY 01/04/23   Rollene Rotunda, MD  sucralfate (CARAFATE) 1 GM/10ML suspension Take 10 mLs (1 g total) by mouth 2 (two) times daily. 03/15/23   Arnaldo Natal, NP      Allergies    Other and Penicillins    Review of Systems   Review of Systems   Constitutional:  Negative for fever.  Respiratory:  Negative for shortness of breath.   Cardiovascular:  Positive for palpitations. Negative for chest pain.  Gastrointestinal:  Positive for abdominal pain. Negative for constipation, diarrhea, nausea and vomiting.  Genitourinary:  Negative for dysuria.    Physical Exam Updated Vital Signs BP 102/71 (BP Location: Right Arm)   Pulse (!) 49   Temp 98 F (36.7 C) (Oral)   Resp 16   Wt 77 kg   SpO2 100%   BMI 27.40 kg/m  Physical Exam Vitals and nursing note reviewed.  Constitutional:      General: She is not in acute distress.    Appearance: Normal appearance. She is well-developed.  HENT:     Head: Normocephalic and atraumatic.  Eyes:     Conjunctiva/sclera: Conjunctivae normal.  Cardiovascular:     Rate and Rhythm: Normal rate and regular rhythm.     Heart sounds: Normal heart sounds. No murmur heard. Pulmonary:     Effort: Pulmonary effort is normal. No respiratory distress.     Breath sounds: Normal breath sounds.  Abdominal:     Palpations: Abdomen is soft.     Tenderness: There is no abdominal tenderness. There is no guarding or rebound.  Musculoskeletal:        General: No swelling. Normal range of motion.     Cervical back: Neck supple.     Right lower leg: No edema.     Left lower leg: No edema.  Skin:    General: Skin is warm and dry.     Capillary Refill: Capillary refill takes less than 2 seconds.  Neurological:     General: No focal deficit present.     Mental Status: She is alert.     Motor: No weakness.     ED Results / Procedures / Treatments   Labs (all labs ordered are listed, but only abnormal results are displayed) Labs Reviewed  BASIC METABOLIC PANEL - Abnormal; Notable for the following components:      Result Value   Glucose, Bld 105 (*)    Creatinine, Ser 1.23 (*)    GFR, Estimated 47 (*)    All other components within normal limits  CBC - Abnormal; Notable for the following  components:   RBC 3.63 (*)    Hemoglobin 11.4 (*)    HCT 35.1 (*)    All other components within normal limits  HEPATIC FUNCTION PANEL - Abnormal; Notable for the following components:   Total Protein 5.8 (*)    All other components within normal limits  LIPASE, BLOOD  TROPONIN I (HIGH SENSITIVITY)  TROPONIN I (HIGH SENSITIVITY)    EKG None  Radiology DG Chest 2 View  Result Date:  04/05/2023 CLINICAL DATA:  Chest pain. EXAM: CHEST - 2 VIEW COMPARISON:  April 04, 2023. FINDINGS: The heart size and mediastinal contours are within normal limits. Both lungs are clear. The visualized skeletal structures are unremarkable. IMPRESSION: No active cardiopulmonary disease. Electronically Signed   By: Lupita Raider M.D.   On: 04/05/2023 17:50   DG Chest 2 View  Result Date: 04/04/2023 CLINICAL DATA:  Chest pain. EXAM: CHEST - 2 VIEW COMPARISON:  04/01/2023. FINDINGS: Low lung volumes accentuate the pulmonary vasculature and cardiomediastinal silhouette. No consolidation or pulmonary edema. No pleural effusion or pneumothorax. IMPRESSION: Low lung volumes without evidence of acute cardiopulmonary disease. Electronically Signed   By: Orvan Falconer M.D.   On: 04/04/2023 18:45    Procedures Procedures  {Document cardiac monitor, telemetry assessment procedure when appropriate:1}  Medications Ordered in ED Medications - No data to display  ED Course/ Medical Decision Making/ A&P   {   Click here for ABCD2, HEART and other calculatorsREFRESH Note before signing :1}                          Medical Decision Making Amount and/or Complexity of Data Reviewed Labs: ordered. Radiology: ordered.   This patient complains of ***; this involves an extensive number of treatment Options and is a complaint that carries with it a high risk of complications and morbidity. The differential includes ***  I ordered, reviewed and interpreted labs, which included *** I ordered medication *** and  reviewed PMP when indicated. I ordered imaging studies which included *** and I independently    visualized and interpreted imaging which showed *** Additional history obtained from *** Previous records obtained and reviewed *** I consulted *** and discussed lab and imaging findings and discussed disposition.  Cardiac monitoring reviewed, *** Social determinants considered, *** Critical Interventions: ***  After the interventions stated above, I reevaluated the patient and found *** Admission and further testing considered, ***   {Document critical care time when appropriate:1} {Document review of labs and clinical decision tools ie heart score, Chads2Vasc2 etc:1}  {Document your independent review of radiology images, and any outside records:1} {Document your discussion with family members, caretakers, and with consultants:1} {Document social determinants of health affecting pt's care:1} {Document your decision making why or why not admission, treatments were needed:1} Final Clinical Impression(s) / ED Diagnoses Final diagnoses:  None    Rx / DC Orders ED Discharge Orders     None

## 2023-04-05 NOTE — ED Provider Triage Note (Signed)
Emergency Medicine Provider Triage Evaluation Note  Rachel Black , a 72 y.o. female  was evaluated in triage.  Pt complains of chest pain and abdominal pain has been going on for the last year, that will got worse today.  She does not denies any nausea, vomiting, shortness of breath.  She states that the pain comes and goes..  Review of Systems  Positive: Chest pain, abdominal pain Negative: N/V/D  Physical Exam  BP 113/60 (BP Location: Right Arm)   Pulse 66   Temp 98.1 F (36.7 C)   Resp 15   Wt 77 kg   SpO2 100%   BMI 27.40 kg/m  Gen:   Awake, no distress   Resp:  Normal effort  MSK:   Moves extremities without difficulty  Other:  +epigastric pain w/o guarding  Medical Decision Making  Medically screening exam initiated at 5:37 PM.  Appropriate orders placed.  Rachel Black was informed that the remainder of the evaluation will be completed by another provider, this initial triage assessment does not replace that evaluation, and the importance of remaining in the ED until their evaluation is complete.    Rachel Black, [place] 04/05/23 1738

## 2023-04-05 NOTE — ED Notes (Signed)
Patient is being discharged from the Urgent Care and sent to the Emergency Department via [facility] . Per Phil Dopp, NP, patient is in need of higher level of care due to chest pain, abd pain, lightheadedness/dizziness. Patient is aware and verbalizes understanding of plan of care.  Vitals:   04/05/23 1547  BP: 135/81  Pulse: 80  Resp: 13  Temp: 98.1 F (36.7 C)  SpO2: 97%

## 2023-04-05 NOTE — Discharge Instructions (Addendum)
We are sending you to the hospital for further evaluation and management of the chest pain

## 2023-04-05 NOTE — ED Triage Notes (Signed)
Pt states "I've been to the ER so many times for this", but states "today I fell like I'm gonna fall out". C/O "throbbing" chest and abd pain like when she was in ED yesterday, "but I feel worse today". Skin warm and dry. Pt does not appear dyspneic; breathing non-labored. Spouse at bedside.

## 2023-04-05 NOTE — Discharge Instructions (Signed)
You were seen in the emergency department for continued upper abdominal pain along with palpitations.  You had blood work EKG chest x-ray that did not show any significant findings.  Please follow-up with your primary care doctor.  Also follow-up with GI.  Return to the emergency department if any worsening or concerning symptoms

## 2023-04-06 ENCOUNTER — Other Ambulatory Visit: Payer: Self-pay

## 2023-04-06 ENCOUNTER — Other Ambulatory Visit: Payer: Self-pay | Admitting: Nurse Practitioner

## 2023-04-06 ENCOUNTER — Emergency Department (HOSPITAL_COMMUNITY)
Admission: EM | Admit: 2023-04-06 | Discharge: 2023-04-06 | Disposition: A | Discharge status: Home / Self Care | Payer: BC Managed Care – PPO | Attending: Emergency Medicine | Admitting: Emergency Medicine

## 2023-04-06 ENCOUNTER — Encounter (HOSPITAL_COMMUNITY): Payer: Self-pay

## 2023-04-06 DIAGNOSIS — R1030 Lower abdominal pain, unspecified: Secondary | ICD-10-CM | POA: Insufficient documentation

## 2023-04-06 DIAGNOSIS — I152 Hypertension secondary to endocrine disorders: Secondary | ICD-10-CM

## 2023-04-06 DIAGNOSIS — G8929 Other chronic pain: Secondary | ICD-10-CM

## 2023-04-06 DIAGNOSIS — R944 Abnormal results of kidney function studies: Secondary | ICD-10-CM | POA: Diagnosis not present

## 2023-04-06 DIAGNOSIS — Z7901 Long term (current) use of anticoagulants: Secondary | ICD-10-CM | POA: Insufficient documentation

## 2023-04-06 LAB — COMPREHENSIVE METABOLIC PANEL
ALT: 11 U/L (ref 0–44)
AST: 18 U/L (ref 15–41)
Albumin: 3.7 g/dL (ref 3.5–5.0)
Alkaline Phosphatase: 48 U/L (ref 38–126)
Anion gap: 8 (ref 5–15)
BUN: 24 mg/dL — ABNORMAL HIGH (ref 8–23)
CO2: 22 mmol/L (ref 22–32)
Calcium: 9.2 mg/dL (ref 8.9–10.3)
Chloride: 107 mmol/L (ref 98–111)
Creatinine, Ser: 1.41 mg/dL — ABNORMAL HIGH (ref 0.44–1.00)
GFR, Estimated: 40 mL/min — ABNORMAL LOW (ref >60)
Glucose, Bld: 104 mg/dL — ABNORMAL HIGH (ref 70–99)
Potassium: 4.1 mmol/L (ref 3.5–5.1)
Sodium: 137 mmol/L (ref 135–145)
Total Bilirubin: 0.7 mg/dL (ref 0.3–1.2)
Total Protein: 5.8 g/dL — ABNORMAL LOW (ref 6.5–8.1)

## 2023-04-06 LAB — CBC
HCT: 35.3 % — ABNORMAL LOW (ref 36.0–46.0)
Hemoglobin: 11.5 g/dL — ABNORMAL LOW (ref 12.0–15.0)
MCH: 32.1 pg (ref 26.0–34.0)
MCHC: 32.6 g/dL (ref 30.0–36.0)
MCV: 98.6 fL (ref 80.0–100.0)
Platelets: 171 10*3/uL (ref 150–400)
RBC: 3.58 MIL/uL — ABNORMAL LOW (ref 3.87–5.11)
RDW: 13.2 % (ref 11.5–15.5)
WBC: 4.8 10*3/uL (ref 4.0–10.5)
nRBC: 0 % (ref 0.0–0.2)

## 2023-04-06 LAB — LIPASE, BLOOD: Lipase: 47 U/L (ref 11–51)

## 2023-04-06 NOTE — ED Provider Notes (Signed)
[facility] AT [facility] Provider Note   CSN: [id number] Arrival date & time: 04/06/23  1645     History  Chief Complaint  Patient presents with   Abdominal Pain    Rachel Black is a 72 y.o. female.  72 year old female with history of chronic abdominal pain who presents emergency department with recurrent abdominal pain.  Has had over a year of abdominal pain.  Reports that it is mild.  Mostly in the lower abdomen.  No exacerbating or alleviating factors.  No nausea or vomiting today.  No diarrhea.  Has been seen here multiple times for this and has had multiple CT scans in the past.  Additional history obtained per the patient's husband who states that her abdominal pain seems to be worse when she is alone at home.  He says that when she calls him and he is at work she is complaining of abdominal pain but that when he gets home the abdominal pain seems to resolve.  He feels that this may be related to stress and perhaps due to her being alone.       Home Medications Prior to Admission medications   Medication Sig Start Date End Date Taking? Authorizing Provider  ACCU-CHEK AVIVA PLUS test strip USE AS INSTRUCTED ONCE A DAY. DX CODE E11.9 07/20/22   Mayer Masker, PA-C  Accu-Chek Softclix Lancets lancets Use as instructed once a day.  DX Code: E11.9 03/24/22   Mayer Masker, PA-C  acetaminophen (TYLENOL) 500 MG tablet Take 1,000 mg by mouth every 6 (six) hours as needed for mild pain or moderate pain.    [provider]  allopurinol (ZYLOPRIM) 300 MG tablet TAKE 1 TABLET BY MOUTH TWICE A DAY 03/04/23   Carlean Jews, NP  apixaban (ELIQUIS) 5 MG TABS tablet Take 1 tablet (5 mg total) by mouth 2 (two) times daily. 02/09/23   Saralyn Pilar A, PA  CALCIUM PO Take 2 tablets by mouth daily.    [provider]  dicyclomine (BENTYL) 20 MG tablet Take 1 tablet (20 mg total) by mouth 2 (two) times daily. 03/28/23   Karie Mainland, Amjad, PA-C   Emollient (UDDERLY SMOOTH) CREA Apply 1 application topically See admin instructions. Apply topically to skin folds as needed for rash/irritation    [provider]  famotidine (PEPCID) 10 MG tablet Take 10 mg by mouth daily as needed for heartburn or indigestion.    [provider]  fenofibrate 160 MG tablet TAKE 1 TABLET BY MOUTH EVERY DAY 03/04/23   Carlean Jews, NP  ferrous sulfate 325 (65 FE) MG tablet TAKE 1 TABLET BY MOUTH TWICE A DAY Patient taking differently: Take 325 mg by mouth every evening. 08/03/22   Zehr, Princella Pellegrini, PA-C  gabapentin (NEURONTIN) 300 MG capsule Take 1 capsule (300 mg total) by mouth at bedtime. 02/23/23   Gloris Manchester, MD  isosorbide mononitrate (IMDUR) 30 MG 24 hr tablet Take 30 mg by mouth daily. 11/30/22   [provider]  olmesartan (BENICAR) 5 MG tablet TAKE 1 TABLET (5 MG TOTAL) BY MOUTH DAILY. 02/05/23   Carlean Jews, NP  ondansetron (ZOFRAN-ODT) 4 MG disintegrating tablet Take 1 tablet (4 mg total) by mouth every 8 (eight) hours as needed for nausea or vomiting. 04/03/23   Rondel Baton, MD  pantoprazole (PROTONIX) 40 MG tablet TAKE 1 TABLET BY MOUTH EVERY DAY 04/05/23   Hilarie Fredrickson, MD  polyethylene glycol (MIRALAX / GLYCOLAX) 17 g  packet Take 17 g by mouth daily as needed for moderate constipation.    [provider]  rosuvastatin (CRESTOR) 40 MG tablet TAKE 1 TABLET BY MOUTH EVERY DAY 01/04/23   Rollene Rotunda, MD  sucralfate (CARAFATE) 1 GM/10ML suspension Take 10 mLs (1 g total) by mouth 2 (two) times daily. 03/15/23   Arnaldo Natal, NP      Allergies    Other and Penicillins    Review of Systems   Review of Systems  Physical Exam Updated Vital Signs BP 131/72 (BP Location: Left Arm)   Pulse (!) 55   Temp 98.3 F (36.8 C) (Oral)   Resp 16   Ht 5\' 6"  (1.676 m)   Wt 77.1 kg   SpO2 100%   BMI 27.43 kg/m  Physical Exam Vitals and nursing note reviewed.  Constitutional:      General:  She is not in acute distress.    Appearance: She is well-developed.  HENT:     Head: Normocephalic and atraumatic.     Right Ear: External ear normal.     Left Ear: External ear normal.     Nose: Nose normal.  Eyes:     Extraocular Movements: Extraocular movements intact.     Conjunctiva/sclera: Conjunctivae normal.     Pupils: Pupils are equal, round, and reactive to light.  Cardiovascular:     Rate and Rhythm: Normal rate and regular rhythm.  Pulmonary:     Effort: Pulmonary effort is normal. No respiratory distress.  Abdominal:     General: Abdomen is flat. There is no distension.     Palpations: Abdomen is soft. There is no mass.     Tenderness: There is no abdominal tenderness. There is no guarding.  Musculoskeletal:     Cervical back: Normal range of motion and neck supple.  Skin:    General: Skin is warm and dry.  Neurological:     Mental Status: She is alert and oriented to person, place, and time. Mental status is at baseline.  Psychiatric:        Mood and Affect: Mood normal.     ED Results / Procedures / Treatments   Labs (all labs ordered are listed, but only abnormal results are displayed) Labs Reviewed  COMPREHENSIVE METABOLIC PANEL - Abnormal; Notable for the following components:      Result Value   Glucose, Bld 104 (*)    BUN 24 (*)    Creatinine, Ser 1.41 (*)    Total Protein 5.8 (*)    GFR, Estimated 40 (*)    All other components within normal limits  CBC - Abnormal; Notable for the following components:   RBC 3.58 (*)    Hemoglobin 11.5 (*)    HCT 35.3 (*)    All other components within normal limits  LIPASE, BLOOD  URINALYSIS, ROUTINE W REFLEX MICROSCOPIC    EKG None  Radiology DG Chest 2 View  Result Date: 04/05/2023 CLINICAL DATA:  Chest pain. EXAM: CHEST - 2 VIEW COMPARISON:  April 04, 2023. FINDINGS: The heart size and mediastinal contours are within normal limits. Both lungs are clear. The visualized skeletal structures are  unremarkable. IMPRESSION: No active cardiopulmonary disease. Electronically Signed   By: Lupita Raider M.D.   On: 04/05/2023 17:50    Procedures Procedures    Medications Ordered in ED Medications - No data to display  ED Course/ Medical Decision Making/ A&P Clinical Course as of 04/07/23 0002  Tue Apr 06, 2023  2120 Creatinine(!): 1.41 At baseline [RP]    Clinical Course User Index [RP] Rondel Baton, MD                             Medical Decision Making Amount and/or Complexity of Data Reviewed Labs: ordered. Decision-making details documented in ED Course.   LOREAN EKSTRAND is a 72 y.o. female with comorbidities that complicate the patient evaluation including chronic abdominal pain who presents emergency department abdominal pain  Initial Ddx:  Functional abdominal pain, malingering/secondary gain, intra-abdominal abscess/infection, sphincter of Oddi dysfunction  MDM/Course:  Patient presents to the emergency department with chronic abdominal pain.  The patient's husband gave additional history today and says that her pain appears to be worse when she is alone and thinks that is related to stress.  Has had multiple reassuring workups.  Did discuss the possible sphincter of Oddi dysfunction with Dr. Meridee Score from GI.  Says there is nothing to do about it at this point in time or any sort of treatments to start her on.  Will have her follow-up with her GI doctor as an outpatient.  Upon re-evaluation patient remained stable.  Starting to suspect that there may be a component of secondary gain especially given the patient's husband's reports that her pain appears to be worse when she is alone.    This patient presents to the ED for concern of complaints listed in HPI, this involves an extensive number of treatment options, and is a complaint that carries with it a high risk of complications and morbidity. Disposition including potential need for admission considered.    Dispo: DC Home. Return precautions discussed including, but not limited to, those listed in the AVS. Allowed pt time to ask questions which were answered fully prior to dc.  Additional history obtained from spouse Records reviewed Outpatient Clinic Notes The following labs were independently interpreted: Chemistry and show CKD I personally reviewed and interpreted cardiac monitoring: normal sinus rhythm  I have reviewed the patients home medications and made adjustments as needed Consults: Gastroenterology Social Determinants of health:  Elderly       Final Clinical Impression(s) / ED Diagnoses Final diagnoses:  Chronic abdominal pain    Rx / DC Orders ED Discharge Orders     None         Rondel Baton, MD 04/07/23 0002

## 2023-04-06 NOTE — Discharge Instructions (Signed)
You were seen for your abdominal pain in the emergency department.   At home, please continue to take the medications you are prescribed.  If you are feeling anxious please call a friend or family.    Check your MyChart online for the results of any tests that had not resulted by the time you left the emergency department.   Follow-up with your primary doctor in 2-3 days regarding your visit.  Follow-up with your GI doctor soon as possible.  Return immediately to the emergency department if you experience any of the following: Fever, unexplained vomiting, or any other concerning symptoms.    Thank you for visiting our Emergency Department. It was a pleasure taking care of you today.

## 2023-04-06 NOTE — ED Triage Notes (Signed)
Pt c/o generalized abdominal painx1 yr. Pt denies N/V/D.

## 2023-04-08 ENCOUNTER — Emergency Department (HOSPITAL_COMMUNITY): Payer: BC Managed Care – PPO

## 2023-04-08 ENCOUNTER — Encounter (HOSPITAL_COMMUNITY): Payer: Self-pay

## 2023-04-08 ENCOUNTER — Emergency Department (HOSPITAL_COMMUNITY)
Admission: EM | Admit: 2023-04-08 | Discharge: 2023-04-08 | Disposition: A | Discharge status: Home / Self Care | Payer: BC Managed Care – PPO | Attending: Emergency Medicine | Admitting: Emergency Medicine

## 2023-04-08 DIAGNOSIS — I2510 Atherosclerotic heart disease of native coronary artery without angina pectoris: Secondary | ICD-10-CM | POA: Insufficient documentation

## 2023-04-08 DIAGNOSIS — E119 Type 2 diabetes mellitus without complications: Secondary | ICD-10-CM | POA: Diagnosis not present

## 2023-04-08 DIAGNOSIS — I10 Essential (primary) hypertension: Secondary | ICD-10-CM | POA: Insufficient documentation

## 2023-04-08 DIAGNOSIS — M25551 Pain in right hip: Secondary | ICD-10-CM | POA: Diagnosis present

## 2023-04-08 DIAGNOSIS — Z96653 Presence of artificial knee joint, bilateral: Secondary | ICD-10-CM | POA: Insufficient documentation

## 2023-04-08 DIAGNOSIS — Z7901 Long term (current) use of anticoagulants: Secondary | ICD-10-CM | POA: Diagnosis not present

## 2023-04-08 NOTE — ED Notes (Signed)
Assumed care of patient ambulatory to room with use of rolling walker. Patient here today c/o chronic right hip pain. Patient denies injury or new pain symptoms . Patient describes pain as throbbing. Patient able to bare wt walk stand and sit with no acute difficulty. Pt has full rom and cms intact to right leg

## 2023-04-08 NOTE — ED Triage Notes (Signed)
Pt is coming in for some right hip pain that she states is a pain that is present all the time and is a throbbing type pain. Expresses some gneralized abd pain but also mentions that has been an on-going issue she has been seen here for

## 2023-04-08 NOTE — Discharge Instructions (Addendum)
Please take 1000 mg of tylenol as needed for pain. Your X-ray was negative. Please return if you can't walk, have fevers or chills, worsening pain or any other symptoms.

## 2023-04-08 NOTE — ED Provider Notes (Signed)
[facility] EMERGENCY DEPARTMENT AT [facility] Provider Note  CSN: [id number] Arrival date & time: 04/08/23 1531  Chief Complaint(s) Hip Pain  HPI Rachel Black is a 72 y.o. female with history of hypertension, hyperlipidemia, prior stroke presenting with right hip pain.  Patient reports has been there for some time.  Denies trauma.  Decided she wanted to come to the ER just to get it checked out today.  She also reports chronic abdominal pain which she has been to the emergency department many times for and this is unchanged as well.   Past Medical History Past Medical History:  Diagnosis Date   Arthritis    PAIN AND OA BOTH KNEES AND SHOULDERS AND ELBOWS AND WRIST   Blood transfusion without reported diagnosis 2018   after cholecystectomy   Broken foot, right, closed, initial encounter 09/15/2021   no surgery needed, fell at home and went to ED   Diabetes mellitus    ORAL MEDICATION   Diverticulitis    GERD (gastroesophageal reflux disease)    Gout    NO RECENT FLARE UPS   H/O: rheumatic fever    AS A CHILD - NO KNOWN HEART MURMUR OR HEART PROBLEMS   Heart attack (HCC)    Hyperlipidemia    Hypertension    PFO (patent foramen ovale)    Stroke (HCC) 03/2017   Patient Active Problem List   Diagnosis Date Noted   Anxiety 01/31/2023   Diverticulitis 01/22/2023   Diarrhea 01/14/2023   Colitis 01/14/2023   Acute pulmonary embolism (HCC) 01/14/2023   H/O: CVA (cerebrovascular accident) 01/14/2023   CAD S/P percutaneous coronary angioplasty 01/14/2023   Atopic dermatitis 11/01/2022   Inflammatory polyarthropathy (HCC) 11/01/2022   Palpitations 10/25/2022   Left hip pain 10/04/2022   Epigastric pain 03/16/2022   Irritable bowel syndrome with constipation 03/16/2022   Influenza B 09/16/2021   Cryptogenic stroke (HCC) 01/13/2021   GERD without esophagitis 11/26/2020   Iron deficiency 11/26/2020   Generalized abdominal pain 11/26/2020   Family history of breast  cancer in mother 11/27/2019   Cataract of both eyes 11/27/2019   Coronary artery disease involving native coronary artery of native heart without angina pectoris 02/18/2018   NSTEMI (non-ST elevated myocardial infarction) (HCC)    History of rheumatic fever 06/30/2017   PFO (patent foramen ovale)    Cerebrovascular accident (CVA) due to embolism of right middle cerebral artery (HCC) 03/14/2017   Dilation of biliary tract    Elevated LFTs    Choledocholithiasis with obstruction 12/07/2016   AKI (acute kidney injury) (HCC) 12/07/2016   Osteoarthritis of right knee 08/23/2015   Status post total left knee replacement 08/23/2015   Arthritis of knee, right 04/20/2014   Status post total right knee replacement 04/20/2014   Flushing reaction 03/10/2012   Diabetes mellitus (HCC) 02/17/2007   GOUT 02/17/2007   Severe obesity (BMI >= 40) (HCC) 02/17/2007   Mixed hyperlipidemia due to type 2 diabetes mellitus (HCC) 02/17/2007   Hypertension associated with diabetes (HCC) 02/17/2007   Home Medication(s) Prior to Admission medications   Medication Sig Start Date End Date Taking? Authorizing Provider  ACCU-CHEK AVIVA PLUS test strip USE AS INSTRUCTED ONCE A DAY. DX CODE E11.9 07/20/22   Mayer Masker, PA-C  Accu-Chek Softclix Lancets lancets Use as instructed once a day.  DX Code: E11.9 03/24/22   Mayer Masker, PA-C  acetaminophen (TYLENOL) 500 MG tablet Take 1,000 mg by mouth every 6 (six) hours as needed for mild pain  or moderate pain.    [provider]  allopurinol (ZYLOPRIM) 300 MG tablet TAKE 1 TABLET BY MOUTH TWICE A DAY 03/04/23   Carlean Jews, NP  apixaban (ELIQUIS) 5 MG TABS tablet Take 1 tablet (5 mg total) by mouth 2 (two) times daily. 02/09/23   Saralyn Pilar A, PA  CALCIUM PO Take 2 tablets by mouth daily.    [provider]  dicyclomine (BENTYL) 20 MG tablet Take 1 tablet (20 mg total) by mouth 2 (two) times daily. 03/28/23   Karie Mainland, Amjad, PA-C  Emollient  (UDDERLY SMOOTH) CREA Apply 1 application topically See admin instructions. Apply topically to skin folds as needed for rash/irritation    [provider]  famotidine (PEPCID) 10 MG tablet Take 10 mg by mouth daily as needed for heartburn or indigestion.    [provider]  fenofibrate 160 MG tablet TAKE 1 TABLET BY MOUTH EVERY DAY 03/04/23   Carlean Jews, NP  ferrous sulfate 325 (65 FE) MG tablet TAKE 1 TABLET BY MOUTH TWICE A DAY Patient taking differently: Take 325 mg by mouth every evening. 08/03/22   Zehr, Princella Pellegrini, PA-C  gabapentin (NEURONTIN) 300 MG capsule Take 1 capsule (300 mg total) by mouth at bedtime. 02/23/23   Gloris Manchester, MD  isosorbide mononitrate (IMDUR) 30 MG 24 hr tablet Take 30 mg by mouth daily. 11/30/22   [provider]  olmesartan (BENICAR) 5 MG tablet TAKE 1 TABLET (5 MG TOTAL) BY MOUTH DAILY. 02/05/23   Carlean Jews, NP  ondansetron (ZOFRAN-ODT) 4 MG disintegrating tablet Take 1 tablet (4 mg total) by mouth every 8 (eight) hours as needed for nausea or vomiting. 04/03/23   Rondel Baton, MD  pantoprazole (PROTONIX) 40 MG tablet TAKE 1 TABLET BY MOUTH EVERY DAY 04/05/23   Hilarie Fredrickson, MD  polyethylene glycol (MIRALAX / GLYCOLAX) 17 g packet Take 17 g by mouth daily as needed for moderate constipation.    [provider]  rosuvastatin (CRESTOR) 40 MG tablet TAKE 1 TABLET BY MOUTH EVERY DAY 01/04/23   Rollene Rotunda, MD  sucralfate (CARAFATE) 1 GM/10ML suspension Take 10 mLs (1 g total) by mouth 2 (two) times daily. 03/15/23   Arnaldo Natal, NP                                                                                                                                    Past Surgical History Past Surgical History:  Procedure Laterality Date   ABDOMINAL HYSTERECTOMY     CESAREAN SECTION     CHOLECYSTECTOMY N/A 12/09/2016   Procedure: LAPAROSCOPIC CHOLECYSTECTOMY WITH INTRAOPERATIVE CHOLANGIOGRAM;  Surgeon: Almond Lint, MD;  Location: [facility];  Service: General;  Laterality: N/A;   COLONOSCOPY     COLONOSCOPY WITH PROPOFOL N/A 01/30/2013   Procedure: COLONOSCOPY WITH PROPOFOL;  Surgeon: Hilarie Fredrickson, MD;  Location: [facility];  Service: Endoscopy;  Laterality: N/A;   ERCP N/A 12/08/2016   Procedure: ENDOSCOPIC RETROGRADE CHOLANGIOPANCREATOGRAPHY (ERCP);  Surgeon: Meryl Dare, MD;  Location: [facility];  Service: Endoscopy;  Laterality: N/A;   ESOPHAGOGASTRODUODENOSCOPY (EGD) WITH PROPOFOL N/A 01/30/2013   Procedure: ESOPHAGOGASTRODUODENOSCOPY (EGD) WITH PROPOFOL;  Surgeon: Hilarie Fredrickson, MD;  Location: [facility];  Service: Endoscopy;  Laterality: N/A;   LEFT HEART CATH AND CORONARY ANGIOGRAPHY N/A 10/18/2017   Procedure: LEFT HEART CATH AND CORONARY ANGIOGRAPHY;  Surgeon: [place], Peter M, MD;  [facility] INVASIVE CV LAB;  Service: Cardiovascular;  Laterality: N/A;   LOOP RECORDER INSERTION N/A 03/18/2017   Procedure: Loop Recorder Insertion;  Surgeon: Regan Lemming, MD;  Location: [facility] INVASIVE CV LAB;  Service: Cardiovascular;  Laterality: N/A;   PARTIAL HYSTERECTOMY     TEE WITHOUT CARDIOVERSION N/A 03/16/2017   Procedure: TRANSESOPHAGEAL ECHOCARDIOGRAM (TEE);  Surgeon: Elease Hashimoto Deloris Ping, MD;  Location: [facility] ENDOSCOPY;  Service: Cardiovascular;  Laterality: N/A;   TOTAL KNEE ARTHROPLASTY Right 04/20/2014   Procedure: RIGHT TOTAL KNEE ARTHROPLASTY CONVERTED TO RIGHT KNEE REIMPLANTATION;  Surgeon: Kathryne Hitch, MD;  Location: [facility];  Service: Orthopedics;  Laterality: Right;   TOTAL KNEE ARTHROPLASTY Left 08/23/2015   Procedure: LEFT TOTAL KNEE ARTHROPLASTY;  Surgeon: Kathryne Hitch, MD;  Location: [facility];  Service: Orthopedics;  Laterality: Left;   VENTRAL HERNIA REPAIR     x2   Family History Family History  Problem Relation Age of Onset   Diabetes Mother    Hypertension Mother        entire family   Breast cancer Mother        diagnosed in her 64's   Stroke  Father        CVA   Hyperlipidemia Father        entire family   Diabetes Father    Heart disease Father        No details.  Not at an early age.     Crohn's disease Son    Irritable bowel syndrome Son    Colon cancer Neg Hx    Colon polyps Neg Hx    Esophageal cancer Neg Hx    Stomach cancer Neg Hx    Rectal cancer Neg Hx     Social History Social History   Tobacco Use   Smoking status: Never    Passive exposure: Never   Smokeless tobacco: Never  Vaping Use   Vaping status: Never Used  Substance Use Topics   Alcohol use: Not Currently    Comment: rarely in the past   Drug use: Never   Allergies Other and Penicillins  Review of Systems Review of Systems  All other systems reviewed and are negative.   Physical Exam Vital Signs  I have reviewed the triage vital signs BP 107/70 (BP Location: Right Arm)   Pulse 65   Temp 98.6 F (37 C) (Oral)   Resp 18   SpO2 99%  Physical Exam Vitals and nursing note reviewed.  Constitutional:      Appearance: Normal appearance.  HENT:     Head: Normocephalic and atraumatic.     Mouth/Throat:     Mouth: Mucous membranes are moist.  Eyes:     Conjunctiva/sclera: Conjunctivae normal.  Cardiovascular:     Rate and Rhythm: Normal rate.  Pulmonary:     Effort: Pulmonary effort is normal. No respiratory distress.  Abdominal:     General: Abdomen is flat.  Musculoskeletal:  General: No deformity.     Comments: Full range of motion of the right hip.  No deformity.  No tenderness.  No lower extremity edema.  2+ PT pulse on right.  Skin:    General: Skin is warm and dry.     Capillary Refill: Capillary refill takes less than 2 seconds.  Neurological:     General: No focal deficit present.     Mental Status: She is alert. Mental status is at baseline.  Psychiatric:        Mood and Affect: Mood normal.        Behavior: Behavior normal.     ED Results and Treatments Labs (all labs ordered are listed, but only  abnormal results are displayed) Labs Reviewed - No data to display                                                                                                                        Radiology DG Hip Unilat  With Pelvis 2-3 Views Right  Result Date: 04/08/2023 CLINICAL DATA:  Atraumatic right hip pain EXAM: DG HIP (WITH OR WITHOUT PELVIS) 2-3V RIGHT COMPARISON:  CT abdomen and pelvis 03/12/2023 FINDINGS: No acute fracture or dislocation. Degenerative changes pubic symphysis, both hips, SI joints and lower lumbar spine. Sclerosis in the right iliac wing compatible with bone island. IMPRESSION: No acute fracture or dislocation. Electronically Signed   By: Minerva Fester M.D.   On: 04/08/2023 17:09    Pertinent labs & imaging results that were available during my care of the patient were reviewed by me and considered in my medical decision making (see MDM for details).  Medications Ordered in ED Medications - No data to display                                                                                                                                   Procedures Procedures  (including critical care time)  Medical Decision Making / ED Course   MDM:  72 year old female presenting to the emergency department hip pain.  Patient reports this is chronic unchanged hip pain.  She has been able to ambulate here but denies any new trauma.  Her hip x-ray is without signs of fracture or dislocation.  Extremely low concern for occult fracture, occult septic joint, or other lower extremity process such as DVT, acute lower leg ischemia, cellulitis or infection, or other dangerous process.  Will discharge patient to home. All questions answered. Patient comfortable with plan of discharge. Return precautions discussed with patient and specified on the after visit summary.       Additional history obtained: -Additional history obtained from spouse -External records from outside source obtained  and reviewed including: Chart review including previous notes, labs, imaging, consultation notes including numerous prior ER visits for abdominal pain   Imaging Studies ordered: I ordered imaging studies including XR hip On my interpretation imaging demonstrates no acute process I independently visualized and interpreted imaging. I agree with the radiologist interpretation   Medicines ordered and prescription drug management: No orders of the defined types were placed in this encounter.   -I have reviewed the patients home medicines and have made adjustments as needed  Co morbidities that complicate the patient evaluation  Past Medical History:  Diagnosis Date   Arthritis    PAIN AND OA BOTH KNEES AND SHOULDERS AND ELBOWS AND WRIST   Blood transfusion without reported diagnosis 2018   after cholecystectomy   Broken foot, right, closed, initial encounter 09/15/2021   no surgery needed, fell at home and went to ED   Diabetes mellitus    ORAL MEDICATION   Diverticulitis    GERD (gastroesophageal reflux disease)    Gout    NO RECENT FLARE UPS   H/O: rheumatic fever    AS A CHILD - NO KNOWN HEART MURMUR OR HEART PROBLEMS   Heart attack (HCC)    Hyperlipidemia    Hypertension    PFO (patent foramen ovale)    Stroke (HCC) 03/2017      Dispostion: Disposition decision including need for hospitalization was considered, and patient discharged from emergency department.    Final Clinical Impression(s) / ED Diagnoses Final diagnoses:  Right hip pain     This chart was dictated using voice recognition software.  Despite best efforts to proofread,  errors can occur which can change the documentation meaning.    Lonell Grandchild, MD 04/08/23 2128

## 2023-04-10 ENCOUNTER — Encounter (HOSPITAL_COMMUNITY): Payer: Self-pay | Admitting: Emergency Medicine

## 2023-04-10 ENCOUNTER — Other Ambulatory Visit: Payer: Self-pay

## 2023-04-10 ENCOUNTER — Emergency Department (HOSPITAL_COMMUNITY): Payer: BC Managed Care – PPO

## 2023-04-10 ENCOUNTER — Emergency Department (HOSPITAL_COMMUNITY)
Admission: EM | Admit: 2023-04-10 | Discharge: 2023-04-10 | Disposition: A | Discharge status: Home / Self Care | Payer: BC Managed Care – PPO | Attending: Emergency Medicine | Admitting: Emergency Medicine

## 2023-04-10 DIAGNOSIS — Z7901 Long term (current) use of anticoagulants: Secondary | ICD-10-CM | POA: Diagnosis not present

## 2023-04-10 DIAGNOSIS — R1084 Generalized abdominal pain: Secondary | ICD-10-CM | POA: Insufficient documentation

## 2023-04-10 DIAGNOSIS — R109 Unspecified abdominal pain: Secondary | ICD-10-CM | POA: Diagnosis present

## 2023-04-10 LAB — COMPREHENSIVE METABOLIC PANEL
ALT: 15 U/L (ref 0–44)
AST: 17 U/L (ref 15–41)
Albumin: 3.6 g/dL (ref 3.5–5.0)
Alkaline Phosphatase: 45 U/L (ref 38–126)
Anion gap: 9 (ref 5–15)
BUN: 25 mg/dL — ABNORMAL HIGH (ref 8–23)
CO2: 25 mmol/L (ref 22–32)
Calcium: 9.4 mg/dL (ref 8.9–10.3)
Chloride: 104 mmol/L (ref 98–111)
Creatinine, Ser: 1.29 mg/dL — ABNORMAL HIGH (ref 0.44–1.00)
GFR, Estimated: 45 mL/min — ABNORMAL LOW (ref >60)
Glucose, Bld: 98 mg/dL (ref 70–99)
Potassium: 4.5 mmol/L (ref 3.5–5.1)
Sodium: 138 mmol/L (ref 135–145)
Total Bilirubin: 0.5 mg/dL (ref 0.3–1.2)
Total Protein: 5.9 g/dL — ABNORMAL LOW (ref 6.5–8.1)

## 2023-04-10 LAB — CBC
HCT: 34.8 % — ABNORMAL LOW (ref 36.0–46.0)
Hemoglobin: 11.3 g/dL — ABNORMAL LOW (ref 12.0–15.0)
MCH: 31.5 pg (ref 26.0–34.0)
MCHC: 32.5 g/dL (ref 30.0–36.0)
MCV: 96.9 fL (ref 80.0–100.0)
Platelets: 177 10*3/uL (ref 150–400)
RBC: 3.59 MIL/uL — ABNORMAL LOW (ref 3.87–5.11)
RDW: 13.3 % (ref 11.5–15.5)
WBC: 5.5 10*3/uL (ref 4.0–10.5)
nRBC: 0 % (ref 0.0–0.2)

## 2023-04-10 LAB — URINALYSIS, ROUTINE W REFLEX MICROSCOPIC
Bilirubin Urine: NEGATIVE
Glucose, UA: NEGATIVE mg/dL
Hgb urine dipstick: NEGATIVE
Ketones, ur: NEGATIVE mg/dL
Leukocytes,Ua: NEGATIVE
Nitrite: NEGATIVE
Protein, ur: NEGATIVE mg/dL
Specific Gravity, Urine: 1.012 (ref 1.005–1.030)
pH: 5 (ref 5.0–8.0)

## 2023-04-10 LAB — TROPONIN I (HIGH SENSITIVITY)
Troponin I (High Sensitivity): 5 ng/L (ref <18)
Troponin I (High Sensitivity): 5 ng/L (ref <18)

## 2023-04-10 LAB — LIPASE, BLOOD: Lipase: 55 U/L — ABNORMAL HIGH (ref 11–51)

## 2023-04-10 NOTE — ED Notes (Signed)
Pts states last BM was 7/27 AM, normal in color.

## 2023-04-10 NOTE — ED Provider Notes (Signed)
[place] EMERGENCY DEPARTMENT AT [facility] Provider Note   CSN: [id number] Arrival date & time: 04/10/23  1704    History  Chief Complaint  Patient presents with   Abdominal Pain    Rachel Black is a 72 y.o. female history of chronic abdominal pain, palpitations here for evaluation of abdominal pain.  Patient states she "still has abdominal pain."  She is frustrated that no one can figure out why she is chronically in pain.  States has been going on over the last year.  Nothing makes it worse or better.  No fever, chest pain, shortness of breath, nausea, vomiting, diarrhea, bloody stool.  Is able to eat and drink without difficulty.  Pain is diffuse in nature.  Does not go into her back.  She has been seen multiple times for similar and has had multiple CT scans.  Patient follows with [facility].  She has appointment next month.  States she has tried "every medication" and has not helped.  States a few days ago she had palpitations, none currently.  She was seen in the emergency department for this at that time.  She denies any chest pain, shortness of breath, pain or swelling to lower legs.  No recent illnesses.  Patient here with husband.  States symptoms have remained unchanged and they started 1 year ago  HPI     Home Medications Prior to Admission medications   Medication Sig Start Date End Date Taking? Authorizing Provider  ACCU-CHEK AVIVA PLUS test strip USE AS INSTRUCTED ONCE A DAY. DX CODE E11.9 07/20/22   Mayer Masker, PA-C  Accu-Chek Softclix Lancets lancets Use as instructed once a day.  DX Code: E11.9 03/24/22   Mayer Masker, PA-C  acetaminophen (TYLENOL) 500 MG tablet Take 1,000 mg by mouth every 6 (six) hours as needed for mild pain or moderate pain.    [provider]  allopurinol (ZYLOPRIM) 300 MG tablet TAKE 1 TABLET BY MOUTH TWICE A DAY 03/04/23   Carlean Jews, NP  apixaban (ELIQUIS) 5 MG TABS tablet Take 1 tablet (5 mg total) by mouth  2 (two) times daily. 02/09/23   Saralyn Pilar A, PA  CALCIUM PO Take 2 tablets by mouth daily.    [provider]  dicyclomine (BENTYL) 20 MG tablet Take 1 tablet (20 mg total) by mouth 2 (two) times daily. 03/28/23   Karie Mainland, Amjad, PA-C  Emollient (UDDERLY SMOOTH) CREA Apply 1 application topically See admin instructions. Apply topically to skin folds as needed for rash/irritation    [provider]  famotidine (PEPCID) 10 MG tablet Take 10 mg by mouth daily as needed for heartburn or indigestion.    [provider]  fenofibrate 160 MG tablet TAKE 1 TABLET BY MOUTH EVERY DAY 03/04/23   Carlean Jews, NP  ferrous sulfate 325 (65 FE) MG tablet TAKE 1 TABLET BY MOUTH TWICE A DAY Patient taking differently: Take 325 mg by mouth every evening. 08/03/22   Zehr, Princella Pellegrini, PA-C  gabapentin (NEURONTIN) 300 MG capsule Take 1 capsule (300 mg total) by mouth at bedtime. 02/23/23   Gloris Manchester, MD  isosorbide mononitrate (IMDUR) 30 MG 24 hr tablet Take 30 mg by mouth daily. 11/30/22   [provider]  olmesartan (BENICAR) 5 MG tablet TAKE 1 TABLET (5 MG TOTAL) BY MOUTH DAILY. 02/05/23   Carlean Jews, NP  ondansetron (ZOFRAN-ODT) 4 MG disintegrating tablet Take 1 tablet (4 mg total) by mouth every 8 (eight)  hours as needed for nausea or vomiting. 04/03/23   Rondel Baton, MD  pantoprazole (PROTONIX) 40 MG tablet TAKE 1 TABLET BY MOUTH EVERY DAY 04/05/23   Hilarie Fredrickson, MD  polyethylene glycol (MIRALAX / GLYCOLAX) 17 g packet Take 17 g by mouth daily as needed for moderate constipation.    [provider]  rosuvastatin (CRESTOR) 40 MG tablet TAKE 1 TABLET BY MOUTH EVERY DAY 01/04/23   Rollene Rotunda, MD  sucralfate (CARAFATE) 1 GM/10ML suspension Take 10 mLs (1 g total) by mouth 2 (two) times daily. 03/15/23   Arnaldo Natal, NP      Allergies    Other and Penicillins    Review of Systems   Review of Systems  Constitutional: Negative.   HENT:  Negative.    Respiratory: Negative.    Cardiovascular:  Positive for palpitations. Leg swelling: resolved. Gastrointestinal:  Positive for abdominal pain. Negative for abdominal distention, anal bleeding, blood in stool, constipation, diarrhea, nausea, rectal pain and vomiting.  Genitourinary: Negative.   Musculoskeletal: Negative.   Skin: Negative.   Neurological: Negative.   All other systems reviewed and are negative.   Physical Exam Updated Vital Signs BP 100/70 (BP Location: Left Arm)   Pulse 60   Temp 97.8 F (36.6 C) (Oral)   Resp 18   SpO2 97%  Physical Exam Vitals and nursing note reviewed.  Constitutional:      General: She is not in acute distress.    Appearance: She is well-developed. She is not ill-appearing, toxic-appearing or diaphoretic.  HENT:     Head: Atraumatic.  Eyes:     Pupils: Pupils are equal, round, and reactive to light.  Cardiovascular:     Rate and Rhythm: Normal rate.     Heart sounds: Normal heart sounds.  Pulmonary:     Effort: Pulmonary effort is normal. No respiratory distress.     Breath sounds: Normal breath sounds.  Abdominal:     General: Bowel sounds are normal. There is no distension.     Palpations: Abdomen is soft.     Tenderness: There is no abdominal tenderness. There is no right CVA tenderness, left CVA tenderness, guarding or rebound. Negative signs include Murphy's sign and McBurney's sign.     Hernia: No hernia is present.  Musculoskeletal:        General: Normal range of motion.     Cervical back: Normal range of motion.  Skin:    General: Skin is warm and dry.  Neurological:     General: No focal deficit present.     Mental Status: She is alert.  Psychiatric:        Mood and Affect: Mood normal.    ED Results / Procedures / Treatments   Labs (all labs ordered are listed, but only abnormal results are displayed) Labs Reviewed  LIPASE, BLOOD - Abnormal; Notable for the following components:      Result Value    Lipase 55 (*)    All other components within normal limits  COMPREHENSIVE METABOLIC PANEL - Abnormal; Notable for the following components:   BUN 25 (*)    Creatinine, Ser 1.29 (*)    Total Protein 5.9 (*)    GFR, Estimated 45 (*)    All other components within normal limits  CBC - Abnormal; Notable for the following components:   RBC 3.59 (*)    Hemoglobin 11.3 (*)    HCT 34.8 (*)    All other components within normal  limits  URINALYSIS, ROUTINE W REFLEX MICROSCOPIC  TROPONIN I (HIGH SENSITIVITY)  TROPONIN I (HIGH SENSITIVITY)    EKG EKG Interpretation Date/Time:  Saturday April 10 2023 17:08:09 EDT Ventricular Rate:  65 PR Interval:  160 QRS Duration:  96 QT Interval:  396 QTC Calculation: 411 R Axis:   -66  Text Interpretation: Sinus rhythm with Premature supraventricular complexes Left anterior fascicular block Possible Anterior infarct , age undetermined Confirmed by Pricilla Loveless [phone]) on 04/10/2023 7:02:58 PM  Radiology DG Chest 2 View  Result Date: 04/10/2023 CLINICAL DATA:  Chest pain EXAM: CHEST - 2 VIEW COMPARISON:  04/05/2023 FINDINGS: The heart size and mediastinal contours are within normal limits. Both lungs are clear. High-riding appearance of humeral head suggesting rotator cuff disease. IMPRESSION: No active cardiopulmonary disease. Electronically Signed   By: Jasmine Pang M.D.   On: 04/10/2023 18:05    Procedures Procedures    Medications Ordered in ED Medications - No data to display  ED Course/ Medical Decision Making/ A&P   72 year old for evaluation of abdominal pain.  Symptoms are chronic in nature.  She states these are unchanged from baseline.  She is frustrated that no one can figure out why she has abdominal pain.  She has had multiple CT scans over the last month, most recently last week.  She has also had 3 chest x-rays and 1 hip x-ray over the last week.  She is tolerating p.o. intake.  No back pain, numbness or weakness to suggest AAA,  dissection.  She has benign abdominal exam I do not feel she needs repeat imaging at this time.  She also notes some palpitations however states these resolved a few days ago and she was evaluated for this at this time.  She has no chest pain, shortness of breath.  No clinical evidence of VTE.  She is Wells criteria low risk.  Labs and imaging personally viewed and interpreted obtained from triage:  CBC without leukocytosis, hemoglobin 11.3 similar to baseline Lipase 55, similar to prior Troponin 5 Metabolic panel creatinine 1.29, at baseline Chest x-ray without cardiomegaly, pulm edema, pneumothorax EKG without ischemic changes  I discussed results with patient, husband in room.  Unclear etiology why patient has had abdominal pain over the last month.  Symptoms are chronic in nature, unchanged per patient.  She is tolerating p.o. intake.  Hemodynamically stable.  With regards to her palpitations, patient states these have resolved.  I low suspicion for acute ACS, PE, dissection, infection she is stable for discharge home.  Patient is nontoxic, nonseptic appearing, in no apparent distress.  Patient's pain and other symptoms adequately managed in emergency department.  Fluid bolus given.  Labs, imaging and vitals reviewed.  Patient does not meet the SIRS or Sepsis criteria.  On repeat exam patient does not have a surgical abdomin and there are no peritoneal signs.  No indication of appendicitis, bowel obstruction, bowel perforation, cholecystitis, diverticulitis, AAA, dissection.  Patient discharged home with symptomatic treatment and given strict instructions for follow-up with their primary care physician.  I have also discussed reasons to return immediately to the ER.  Patient expresses understanding and agrees with plan.                               Medical Decision Making Amount and/or Complexity of Data Reviewed Independent Historian: spouse External Data Reviewed: labs, radiology, ECG  and notes. Labs: ordered. Decision-making details documented in ED  Course. Radiology: ordered and independent interpretation performed. Decision-making details documented in ED Course. ECG/medicine tests: ordered and independent interpretation performed. Decision-making details documented in ED Course.  Risk OTC drugs. Prescription drug management. Parenteral controlled substances. Decision regarding hospitalization. Diagnosis or treatment significantly limited by social determinants of health.           Final Clinical Impression(s) / ED Diagnoses Final diagnoses:  Generalized abdominal pain    Rx / DC Orders ED Discharge Orders     None         Padraig Nhan A, PA-C 04/10/23 2043    Pricilla Loveless, MD 04/10/23 2255

## 2023-04-10 NOTE — ED Triage Notes (Signed)
Pt reports ongoing diffuse abdominal pain and nausea with "thumping in her chest".  States this has been going on a while but she wants to get something done.

## 2023-04-10 NOTE — Discharge Instructions (Signed)
Your workup today was reassuring in the emergency department  Make sure to follow-up outpatient  Return for new or worsening symptoms

## 2023-04-11 ENCOUNTER — Other Ambulatory Visit: Payer: Self-pay

## 2023-04-11 ENCOUNTER — Emergency Department (HOSPITAL_COMMUNITY)
Admission: EM | Admit: 2023-04-11 | Discharge: 2023-04-12 | Disposition: A | Discharge status: Home / Self Care | Payer: BC Managed Care – PPO | Attending: Emergency Medicine | Admitting: Emergency Medicine

## 2023-04-11 ENCOUNTER — Encounter (HOSPITAL_COMMUNITY): Payer: Self-pay

## 2023-04-11 ENCOUNTER — Other Ambulatory Visit: Payer: Self-pay | Admitting: Family Medicine

## 2023-04-11 DIAGNOSIS — N289 Disorder of kidney and ureter, unspecified: Secondary | ICD-10-CM | POA: Insufficient documentation

## 2023-04-11 DIAGNOSIS — I10 Essential (primary) hypertension: Secondary | ICD-10-CM | POA: Insufficient documentation

## 2023-04-11 DIAGNOSIS — D539 Nutritional anemia, unspecified: Secondary | ICD-10-CM | POA: Diagnosis not present

## 2023-04-11 DIAGNOSIS — R7309 Other abnormal glucose: Secondary | ICD-10-CM | POA: Insufficient documentation

## 2023-04-11 DIAGNOSIS — Z79899 Other long term (current) drug therapy: Secondary | ICD-10-CM | POA: Insufficient documentation

## 2023-04-11 DIAGNOSIS — Z7901 Long term (current) use of anticoagulants: Secondary | ICD-10-CM | POA: Insufficient documentation

## 2023-04-11 DIAGNOSIS — R1084 Generalized abdominal pain: Secondary | ICD-10-CM | POA: Diagnosis present

## 2023-04-11 DIAGNOSIS — G8929 Other chronic pain: Secondary | ICD-10-CM

## 2023-04-11 MED ORDER — OXYCODONE-ACETAMINOPHEN 5-325 MG PO TABS
1.0000 | ORAL_TABLET | Freq: Once | ORAL | Status: AC
  Administered 2023-04-12: 1 via ORAL
  Filled 2023-04-11: qty 1

## 2023-04-11 NOTE — ED Provider Triage Note (Signed)
Emergency Medicine Provider Triage Evaluation Note  Rachel Black , a 71 y.o. female  was evaluated in triage.  Pt complains of here for chronic lower abdominal pain.  Patient has been seen 37 times in the last 6 months in the emergency department for the same.  She was seen yesterday.  She has no changes in her abdominal pain.  Takes MiraLAX.Marland Kitchen  Review of Systems  Positive: Abdominal pain Negative: Fever  Physical Exam  BP 112/66   Pulse 63   Temp 98.4 F (36.9 C) (Oral)   Resp 16   SpO2 99%  Gen:   Awake, no distress   Resp:  Normal effort  MSK:   Moves extremities without difficulty  Other:  Benign abdominal exam without tenderness to palpation  Medical Decision Making  Medically screening exam initiated at 7:39 PM.  Appropriate orders placed.  [name] was informed that the remainder of the evaluation will be completed by another provider, this initial triage assessment does not replace that evaluation, and the importance of remaining in the ED until their evaluation is complete.     Arthor Captain, PA-C 04/11/23 1940

## 2023-04-11 NOTE — ED Provider Notes (Signed)
[place] EMERGENCY DEPARTMENT AT [facility] Provider Note   CSN: [id number] Arrival date & time: 04/11/23  1834     History {Add pertinent medical, surgical, social history, OB history to HPI:1} Chief Complaint  Patient presents with   Abdominal Pain    Rachel Black is a 72 y.o. female.  The history is provided by the patient.  Abdominal Pain She has history of hypertension, hyperlipidemia, chronic abdominal pain and comes in with persistent generalized abdominal pain.  She states pain tonight is perhaps slightly worse than normal.  There is no radiation of pain.  She denies nausea, vomiting, constipation, diarrhea.  She has not taken anything other than acetaminophen for pain.  She states that she has been prescribed various medications in the past but they have not helped her.  She is supposed to follow-up with her gastroenterologist sometime in the next month.   Home Medications Prior to Admission medications   Medication Sig Start Date End Date Taking? Authorizing Provider  ACCU-CHEK AVIVA PLUS test strip USE AS INSTRUCTED ONCE A DAY. DX CODE E11.9 07/20/22   Mayer Masker, PA-C  Accu-Chek Softclix Lancets lancets Use as instructed once a day.  DX Code: E11.9 03/24/22   Mayer Masker, PA-C  acetaminophen (TYLENOL) 500 MG tablet Take 1,000 mg by mouth every 6 (six) hours as needed for mild pain or moderate pain.    [provider]  allopurinol (ZYLOPRIM) 300 MG tablet TAKE 1 TABLET BY MOUTH TWICE A DAY 03/04/23   Carlean Jews, NP  apixaban (ELIQUIS) 5 MG TABS tablet Take 1 tablet (5 mg total) by mouth 2 (two) times daily. 02/09/23   Saralyn Pilar A, PA  CALCIUM PO Take 2 tablets by mouth daily.    [provider]  dicyclomine (BENTYL) 20 MG tablet Take 1 tablet (20 mg total) by mouth 2 (two) times daily. 03/28/23   Karie Mainland, Amjad, PA-C  Emollient (UDDERLY SMOOTH) CREA Apply 1 application topically See admin instructions. Apply topically to skin  folds as needed for rash/irritation    [provider]  famotidine (PEPCID) 10 MG tablet Take 10 mg by mouth daily as needed for heartburn or indigestion.    [provider]  fenofibrate 160 MG tablet TAKE 1 TABLET BY MOUTH EVERY DAY 03/04/23   Carlean Jews, NP  ferrous sulfate 325 (65 FE) MG tablet TAKE 1 TABLET BY MOUTH TWICE A DAY Patient taking differently: Take 325 mg by mouth every evening. 08/03/22   Zehr, Princella Pellegrini, PA-C  gabapentin (NEURONTIN) 300 MG capsule Take 1 capsule (300 mg total) by mouth at bedtime. 02/23/23   Gloris Manchester, MD  isosorbide mononitrate (IMDUR) 30 MG 24 hr tablet Take 30 mg by mouth daily. 11/30/22   [provider]  olmesartan (BENICAR) 5 MG tablet TAKE 1 TABLET (5 MG TOTAL) BY MOUTH DAILY. 02/05/23   Carlean Jews, NP  ondansetron (ZOFRAN-ODT) 4 MG disintegrating tablet Take 1 tablet (4 mg total) by mouth every 8 (eight) hours as needed for nausea or vomiting. 04/03/23   Rondel Baton, MD  pantoprazole (PROTONIX) 40 MG tablet TAKE 1 TABLET BY MOUTH EVERY DAY 04/05/23   Hilarie Fredrickson, MD  polyethylene glycol (MIRALAX / GLYCOLAX) 17 g packet Take 17 g by mouth daily as needed for moderate constipation.    [provider]  rosuvastatin (CRESTOR) 40 MG tablet TAKE 1 TABLET BY MOUTH EVERY DAY 01/04/23   Rollene Rotunda, MD  sucralfate (CARAFATE) 1  GM/10ML suspension Take 10 mLs (1 g total) by mouth 2 (two) times daily. 03/15/23   Arnaldo Natal, NP      Allergies    Other and Penicillins    Review of Systems   Review of Systems  Gastrointestinal:  Positive for abdominal pain.  All other systems reviewed and are negative.   Physical Exam Updated Vital Signs BP 112/66   Pulse 63   Temp 98.4 F (36.9 C) (Oral)   Resp 16   SpO2 99%  Physical Exam Vitals and nursing note reviewed.   72 year old female, resting comfortably and in no acute distress. Vital signs are normal. Oxygen saturation is 97%, which is  normal. Head is normocephalic and atraumatic. PERRLA, EOMI. Oropharynx is clear. Neck is nontender and supple without adenopathy or JVD. Back is nontender and there is no CVA tenderness. Lungs are clear without rales, wheezes, or rhonchi. Chest is nontender. Heart has regular rate and rhythm without murmur. Abdomen is soft, flat, with mild right upper quadrant tenderness.  There is no rebound or guarding. Extremities have no cyanosis or edema, full range of motion is present. Skin is warm and dry without rash. Neurologic: Mental status is normal, cranial nerves are intact, moves all extremities equally.  ED Results / Procedures / Treatments   Labs (all labs ordered are listed, but only abnormal results are displayed) Labs Reviewed - No data to display  Procedures Procedures  {Document cardiac monitor, telemetry assessment procedure when appropriate:1}  Medications Ordered in ED Medications - No data to display  ED Course/ Medical Decision Making/ A&P   {   Click here for ABCD2, HEART and other calculatorsREFRESH Note before signing :1}                          Medical Decision Making  Chronic abdominal pain with benign exam.  I have reviewed her past records, and she has 37 ED visits over the past 6 months, but was admitted 01/21/2023-01/26/2023 for episode of diverticulitis.  Exam today is not suggestive of diverticulitis, I do not see indication for imaging today.  She did have CT of abdomen and pelvis on 04/03/2023 which showed no acute process.  She has had 13 CT scans of abdomen and pelvis in the last 7 months.  I have ordered screening labs of CBC, comprehensive metabolic panel, lipase and have ordered dose of oxycodone-acetaminophen for pain.  If labs are unremarkable, I plan to discharge her with instructions to follow-up with her gastroenterologist.  {Document critical care time when appropriate:1} {Document review of labs and clinical decision tools ie heart score, Chads2Vasc2  etc:1}  {Document your independent review of radiology images, and any outside records:1} {Document your discussion with family members, caretakers, and with consultants:1} {Document social determinants of health affecting pt's care:1} {Document your decision making why or why not admission, treatments were needed:1} Final Clinical Impression(s) / ED Diagnoses Final diagnoses:  None    Rx / DC Orders ED Discharge Orders     None

## 2023-04-11 NOTE — ED Triage Notes (Signed)
Pt reports abd pain for the past year. States she cannot get in to see her GI doctor.

## 2023-04-12 NOTE — Discharge Instructions (Signed)
Your evaluation today did not show any evidence of any serious problem causing your pain.  He will need to work with your gastroenterologist for management of chronic pain.  You may want to consider discussing with your gastroenterologist whether you would benefit from referral to a pain management clinic.  Return to the emergency department for any new or concerning symptoms.

## 2023-04-12 NOTE — ED Notes (Signed)
Ambulatory to the restroom at this time.

## 2023-04-14 ENCOUNTER — Encounter: Payer: Self-pay | Admitting: *Deleted

## 2023-04-15 NOTE — Progress Notes (Deleted)
Cardiology Office Note:    Date:  04/15/2023   ID:  Rachel Black, DOB [birth year], MRN [id number]  PCP:  Melida Quitter, PA   [facility] Providers Cardiologist:  Rollene Rotunda, MD Cardiology APP:  Raford Pitcher Joline Salt, PA-C { Click to update primary MD,subspecialty MD or APP then REFRESH:1}    Referring MD: Melida Quitter, PA   No chief complaint on file. ***  History of Present Illness:    Rachel Black is a 72 y.o. female with a hx of hypertension, hyperlipidemia, chronic abdominal pain with frequent ER visits, PAT, CAD s/p NSTEMI found to have occluded second marginal treated medically, cryptogenic stroke 2018 with ILR placed negative for arrhythmia, and PFO on TEE.  ILR did not show any sustained arrhythmias, no longer being monitored. CKD 3a, prior cholecystectomy, and arthritis in wheelchair.   Chest.  She has been seen in the ER 14 times in the month of July with abdominal pain, 37 ER visits in the past.  She was admitted May 2024 with an episode of diverticulitis.  Last ER visit was 04/11/2023.  Diagnosed with PE 01/13/23, started on eliquis.   She called the office reporting palpitations and a 7-day Zio patch was placed which showed only 1 run of SVT that was 13 beats.  No plans to change therapy.  No sustained arrhythmias.  She presents today for follow up.      CAD NSTEMI 2019, occluded marginal tx medically - continue risk factor management - maintained on imdur, crestor - if recurrent CP, plan for repeat heart cath to look for microvascular disease - ASA held now with PE on eliquis    Hyperlipidemia with LDL goal < 70 01/24/2023: Cholesterol 107; HDL 32; LDL Cholesterol 56; Triglycerides 96; VLDL 19 Continue crestor and fenofibrate, excellent control   Hypertension - imdur, olmesartan  Palpitations Hx of CVA ILR negative for arrhythmias Recent zio with one episode of SVT (13 beats), no change in therapy   PFO Incidental  finding   PE CTA 01/13/23 - on eliquis, managed by PCP   Past Medical History:  Diagnosis Date   Arthritis    PAIN AND OA BOTH KNEES AND SHOULDERS AND ELBOWS AND WRIST   Blood transfusion without reported diagnosis 2018   after cholecystectomy   Broken foot, right, closed, initial encounter 09/15/2021   no surgery needed, fell at home and went to ED   Diabetes mellitus    ORAL MEDICATION   Diverticulitis    GERD (gastroesophageal reflux disease)    Gout    NO RECENT FLARE UPS   H/O: rheumatic fever    AS A CHILD - NO KNOWN HEART MURMUR OR HEART PROBLEMS   Heart attack (HCC)    Hyperlipidemia    Hypertension    PFO (patent foramen ovale)    Stroke (HCC) 03/2017    Past Surgical History:  Procedure Laterality Date   ABDOMINAL HYSTERECTOMY     CESAREAN SECTION     CHOLECYSTECTOMY N/A 12/09/2016   Procedure: LAPAROSCOPIC CHOLECYSTECTOMY WITH INTRAOPERATIVE CHOLANGIOGRAM;  Surgeon: Almond Lint, MD;  Location: [facility];  Service: General;  Laterality: N/A;   COLONOSCOPY     COLONOSCOPY WITH PROPOFOL N/A 01/30/2013   Procedure: COLONOSCOPY WITH PROPOFOL;  Surgeon: Hilarie Fredrickson, MD;  Location: [facility];  Service: Endoscopy;  Laterality: N/A;   ERCP N/A 12/08/2016   Procedure: ENDOSCOPIC RETROGRADE CHOLANGIOPANCREATOGRAPHY (ERCP);  Surgeon: Meryl Dare, MD;  Location: [facility];  Service: Endoscopy;  Laterality: N/A;   ESOPHAGOGASTRODUODENOSCOPY (EGD) WITH PROPOFOL N/A 01/30/2013   Procedure: ESOPHAGOGASTRODUODENOSCOPY (EGD) WITH PROPOFOL;  Surgeon: Hilarie Fredrickson, MD;  Location: [facility] END[facility];  Service: Endoscopy;  Laterality: N/A;   LEFT HEART CATH AND CORONARY ANGIOGRAPHY N/A 10/18/2017   Procedure: LEFT HEART CATH AND CORONARY ANGIOGRAPHY;  Surgeon: Swaziland, Peter M, MD;  Location: [facility] INVASIVE CV LAB;  Service: Cardiovascular;  Laterality: N/A;   LOOP RECORDER INSERTION N/A 03/18/2017   Procedure: Loop Recorder Insertion;  Surgeon: Regan Lemming, MD;  Location: [facility]  INVASIVE CV LAB;  Service: Cardiovascular;  Laterality: N/A;   PARTIAL HYSTERECTOMY     TEE WITHOUT CARDIOVERSION N/A 03/16/2017   Procedure: TRANSESOPHAGEAL ECHOCARDIOGRAM (TEE);  Surgeon: Elease Hashimoto Deloris Ping, MD;  Location: [facility] ENDOSCOPY;  Service: Cardiovascular;  Laterality: N/A;   TOTAL KNEE ARTHROPLASTY Right 04/20/2014   Procedure: RIGHT TOTAL KNEE ARTHROPLASTY CONVERTED TO RIGHT KNEE REIMPLANTATION;  Surgeon: Kathryne Hitch, MD;  Location: [facility];  Service: Orthopedics;  Laterality: Right;   TOTAL KNEE ARTHROPLASTY Left 08/23/2015   Procedure: LEFT TOTAL KNEE ARTHROPLASTY;  Surgeon: Kathryne Hitch, MD;  Location: [facility];  Service: Orthopedics;  Laterality: Left;   VENTRAL HERNIA REPAIR     x2    Current Medications: No outpatient medications have been marked as taking for the 04/22/23 encounter (Appointment) with Marcelino Duster, PA.     Allergies:   Other and Penicillins   Social History   Socioeconomic History   Marital status: Married    Spouse name: Dorene Sorrow   Number of children: 2   Years of education: Not on file   Highest education level: Not on file  Occupational History   Occupation: housewife    Employer: UNEMPLOYED  Tobacco Use   Smoking status: Never    Passive exposure: Never   Smokeless tobacco: Never  Vaping Use   Vaping status: Never Used  Substance and Sexual Activity   Alcohol use: Not Currently    Comment: rarely in the past   Drug use: Never   Sexual activity: Yes    Partners: Male  Other Topics Concern   Not on file  Social History Narrative   No exercise secondary to knee pain   Social Determinants of Health   Financial Resource Strain: Not on file  Food Insecurity: No Food Insecurity (01/27/2023)   Hunger Vital Sign    Worried About Running Out of Food in the Last Year: Never true    Ran Out of Food in the Last Year: Never true  Transportation Needs: No Transportation Needs (01/27/2023)   PRAPARE - Doctor, general practice (Medical): No    Lack of Transportation (Non-Medical): No  Physical Activity: Not on file  Stress: Not on file  Social Connections: Not on file     Family History: The patient's ***family history includes Breast cancer in her mother; Crohn's disease in her son; Diabetes in her father and mother; Heart disease in her father; Hyperlipidemia in her father; Hypertension in her mother; Irritable bowel syndrome in her son; Stroke in her father. There is no history of Colon cancer, Colon polyps, Esophageal cancer, Stomach cancer, or Rectal cancer.  ROS:   Please see the history of present illness.    *** All other systems reviewed and are negative.  EKGs/Labs/Other Studies Reviewed:    The following studies were reviewed today: ***      Recent Labs: 01/28/2023: Magnesium 2.0 04/11/2023: ALT 12; BUN 24; Creatinine, Ser  1.33; Hemoglobin 11.3; Platelets 161; Potassium 4.3; Sodium 136  Recent Lipid Panel    Component Value Date/Time   CHOL 107 01/24/2023 0501   CHOL 119 08/26/2021 1050   TRIG 96 01/24/2023 0501   HDL 32 (L) 01/24/2023 0501   HDL 31 (L) 08/26/2021 1050   CHOLHDL 3.3 01/24/2023 0501   VLDL 19 01/24/2023 0501   LDLCALC 56 01/24/2023 0501   LDLCALC 53 08/26/2021 1050   LDLDIRECT 63 11/27/2019 1428   LDLDIRECT 50.0 12/21/2017 1044     Risk Assessment/Calculations:   {Does this patient have ATRIAL FIBRILLATION?:[id number]}  No BP recorded.  {Refresh Note OR Click here to enter BP  :1}***         Physical Exam:    VS:  There were no vitals taken for this visit.    Wt Readings from Last 3 Encounters:  04/06/23 169 lb 15.6 oz (77.1 kg)  04/05/23 170 lb (77.1 kg)  04/04/23 169 lb 15.6 oz (77.1 kg)     GEN: *** Well nourished, well developed in no acute distress HEENT: Normal NECK: No JVD; No carotid bruits LYMPHATICS: No lymphadenopathy CARDIAC: ***RRR, no murmurs, rubs, gallops RESPIRATORY:  Clear to auscultation without rales, wheezing or  rhonchi  ABDOMEN: Soft, non-tender, non-distended MUSCULOSKELETAL:  No edema; No deformity  SKIN: Warm and dry NEUROLOGIC:  Alert and oriented x 3 PSYCHIATRIC:  Normal affect   ASSESSMENT:    No diagnosis found. PLAN:    In order of problems listed above:  ***      {Are you ordering a CV Procedure (e.g. stress test, cath, DCCV, TEE, etc)?   Press F2        :[id number]}    Medication Adjustments/Labs and Tests Ordered: Current medicines are reviewed at length with the patient today.  Concerns regarding medicines are outlined above.  No orders of the defined types were placed in this encounter.  No orders of the defined types were placed in this encounter.   There are no Patient Instructions on file for this visit.   Signed, Marcelino Duster, [place]  04/15/2023 9:14 AM    [facility]

## 2023-04-22 ENCOUNTER — Other Ambulatory Visit: Payer: Self-pay | Admitting: Nurse Practitioner

## 2023-04-22 ENCOUNTER — Ambulatory Visit: Payer: BC Managed Care – PPO | Admitting: Physician Assistant

## 2023-04-22 DIAGNOSIS — I152 Hypertension secondary to endocrine disorders: Secondary | ICD-10-CM

## 2023-04-23 ENCOUNTER — Other Ambulatory Visit: Payer: Self-pay | Admitting: Family Medicine

## 2023-04-23 ENCOUNTER — Ambulatory Visit: Payer: BC Managed Care – PPO | Admitting: Gastroenterology

## 2023-04-23 DIAGNOSIS — I2510 Atherosclerotic heart disease of native coronary artery without angina pectoris: Secondary | ICD-10-CM

## 2023-04-25 ENCOUNTER — Emergency Department (HOSPITAL_COMMUNITY)
Admission: EM | Admit: 2023-04-25 | Discharge: 2023-04-26 | Disposition: A | Discharge status: Home / Self Care | Payer: BC Managed Care – PPO | Source: Home / Self Care | Attending: Emergency Medicine | Admitting: Emergency Medicine

## 2023-04-25 ENCOUNTER — Encounter (HOSPITAL_COMMUNITY): Payer: Self-pay

## 2023-04-25 ENCOUNTER — Other Ambulatory Visit: Payer: Self-pay

## 2023-04-25 DIAGNOSIS — R1084 Generalized abdominal pain: Secondary | ICD-10-CM

## 2023-04-25 DIAGNOSIS — R42 Dizziness and giddiness: Secondary | ICD-10-CM | POA: Insufficient documentation

## 2023-04-25 LAB — CBC
HCT: 36.7 % (ref 36.0–46.0)
Hemoglobin: 11.8 g/dL — ABNORMAL LOW (ref 12.0–15.0)
MCH: 31.4 pg (ref 26.0–34.0)
MCHC: 32.2 g/dL (ref 30.0–36.0)
MCV: 97.6 fL (ref 80.0–100.0)
Platelets: 178 10*3/uL (ref 150–400)
RBC: 3.76 MIL/uL — ABNORMAL LOW (ref 3.87–5.11)
RDW: 13.2 % (ref 11.5–15.5)
WBC: 5.4 10*3/uL (ref 4.0–10.5)
nRBC: 0 % (ref 0.0–0.2)

## 2023-04-25 LAB — URINALYSIS, ROUTINE W REFLEX MICROSCOPIC
Bilirubin Urine: NEGATIVE
Glucose, UA: NEGATIVE mg/dL
Hgb urine dipstick: NEGATIVE
Ketones, ur: NEGATIVE mg/dL
Leukocytes,Ua: NEGATIVE
Nitrite: NEGATIVE
Protein, ur: NEGATIVE mg/dL
Specific Gravity, Urine: 1.012 (ref 1.005–1.030)
pH: 7 (ref 5.0–8.0)

## 2023-04-25 LAB — COMPREHENSIVE METABOLIC PANEL
ALT: 13 U/L (ref 0–44)
AST: 21 U/L (ref 15–41)
Albumin: 3.7 g/dL (ref 3.5–5.0)
Alkaline Phosphatase: 46 U/L (ref 38–126)
Anion gap: 9 (ref 5–15)
BUN: 29 mg/dL — ABNORMAL HIGH (ref 8–23)
CO2: 22 mmol/L (ref 22–32)
Calcium: 9.5 mg/dL (ref 8.9–10.3)
Chloride: 106 mmol/L (ref 98–111)
Creatinine, Ser: 1.35 mg/dL — ABNORMAL HIGH (ref 0.44–1.00)
GFR, Estimated: 42 mL/min — ABNORMAL LOW (ref >60)
Glucose, Bld: 105 mg/dL — ABNORMAL HIGH (ref 70–99)
Potassium: 4.6 mmol/L (ref 3.5–5.1)
Sodium: 137 mmol/L (ref 135–145)
Total Bilirubin: 0.5 mg/dL (ref 0.3–1.2)
Total Protein: 5.8 g/dL — ABNORMAL LOW (ref 6.5–8.1)

## 2023-04-25 LAB — LIPASE, BLOOD: Lipase: 53 U/L — ABNORMAL HIGH (ref 11–51)

## 2023-04-25 MED ORDER — OXYCODONE-ACETAMINOPHEN 5-325 MG PO TABS
1.0000 | ORAL_TABLET | Freq: Once | ORAL | Status: AC
  Administered 2023-04-25: 1 via ORAL
  Filled 2023-04-25: qty 1

## 2023-04-25 MED ORDER — SUCRALFATE 1 GM/10ML PO SUSP
1.0000 g | Freq: Three times a day (TID) | ORAL | Status: DC
  Administered 2023-04-25: 1 g via ORAL
  Filled 2023-04-25: qty 10

## 2023-04-25 MED ORDER — ONDANSETRON 4 MG PO TBDP
4.0000 mg | ORAL_TABLET | Freq: Once | ORAL | Status: AC | PRN
  Administered 2023-04-25: 4 mg via ORAL
  Filled 2023-04-25: qty 1

## 2023-04-25 MED ORDER — DICYCLOMINE HCL 10 MG PO CAPS
10.0000 mg | ORAL_CAPSULE | Freq: Once | ORAL | Status: AC
  Administered 2023-04-25: 10 mg via ORAL
  Filled 2023-04-25: qty 1

## 2023-04-25 NOTE — ED Triage Notes (Addendum)
Patient BIB POV from home with complaint of abdominal pain and dizziness.   Reports generalized abdominal pain with nausea x 1 year & dizziness starting this am with headache. Reports took tylenol this am with some relief of headache.    Denies vomiting, denies diarrhea.

## 2023-04-26 MED ORDER — DICYCLOMINE HCL 20 MG PO TABS: 20.0000 mg | ORAL_TABLET | Freq: Two times a day (BID) | ORAL | 0 refills | Status: DC | PRN

## 2023-04-26 MED ORDER — FAMOTIDINE 10 MG PO TABS: 10.0000 mg | ORAL_TABLET | Freq: Two times a day (BID) | ORAL | 0 refills | Status: DC

## 2023-04-26 MED ORDER — FAMOTIDINE 20 MG PO TABS
20.0000 mg | ORAL_TABLET | Freq: Once | ORAL | Status: AC
  Administered 2023-04-26: 20 mg via ORAL
  Filled 2023-04-26: qty 1

## 2023-04-26 MED ORDER — SUCRALFATE 1 GM/10ML PO SUSP: 1.0000 g | Freq: Three times a day (TID) | ORAL | 0 refills | Status: DC

## 2023-04-26 NOTE — ED Provider Notes (Signed)
[place] EMERGENCY DEPARTMENT AT [facility] Provider Note   CSN: [id number] Arrival date & time: 04/25/23  1905     History  Chief Complaint  Patient presents with   Abdominal Pain    Rachel Black is a 72 y.o. female.  72 year old female who presents ER today with recurrent abdominal pain.  Patient states she has had this for a while (her husband states that they have been here at least 20 times for it, in the record indicates that is been well more than that even in the last 6 months).  She states that she feels like it might be a bit worse today than normal.  She has not been on her Bentyl or Pepcid or Carafate recently.  She saw Dr. Marina Goodell of couple weeks ago.  No fevers, nausea, vomiting, diarrhea or constipation.  She has been taking MiraLAX.  No urinary symptoms.  Patient states that she does not feel well.  When I ask her what that means she states that she feels a bit nauseous.  The nursing note states that she is dizzy she denies that at this time.   Abdominal Pain      Home Medications Prior to Admission medications   Medication Sig Start Date End Date Taking? Authorizing Provider  dicyclomine (BENTYL) 20 MG tablet Take 1 tablet (20 mg total) by mouth 2 (two) times daily as needed for spasms (abdominal cramping). 04/26/23  Yes , Barbara Cower, MD  sucralfate (CARAFATE) 1 GM/10ML suspension Take 10 mLs (1 g total) by mouth 4 (four) times daily -  with meals and at bedtime. 04/26/23  Yes , Barbara Cower, MD  ACCU-CHEK AVIVA PLUS test strip USE AS INSTRUCTED ONCE A DAY. DX CODE E11.9 07/20/22   Mayer Masker, PA-C  Accu-Chek Softclix Lancets lancets Use as instructed once a day.  DX Code: E11.9 03/24/22   Mayer Masker, PA-C  acetaminophen (TYLENOL) 500 MG tablet Take 1,000 mg by mouth every 6 (six) hours as needed for mild pain or moderate pain.    [provider]  allopurinol (ZYLOPRIM) 300 MG tablet TAKE 1 TABLET BY MOUTH TWICE A DAY 03/04/23   Carlean Jews, NP  apixaban (ELIQUIS) 5 MG TABS tablet Take 1 tablet (5 mg total) by mouth 2 (two) times daily. 02/09/23   Saralyn Pilar A, PA  CALCIUM PO Take 2 tablets by mouth daily.    [provider]  Emollient (UDDERLY SMOOTH) CREA Apply 1 application topically See admin instructions. Apply topically to skin folds as needed for rash/irritation    [provider]  famotidine (PEPCID) 10 MG tablet Take 1 tablet (10 mg total) by mouth 2 (two) times daily for 5 days. And then afterwards take as needed for heartburn 04/26/23 05/01/23  , Barbara Cower, MD  fenofibrate 160 MG tablet TAKE 1 TABLET BY MOUTH EVERY DAY 03/04/23   Carlean Jews, NP  ferrous sulfate 325 (65 FE) MG tablet TAKE 1 TABLET BY MOUTH TWICE A DAY Patient taking differently: Take 325 mg by mouth every evening. 08/03/22   Zehr, Princella Pellegrini, PA-C  gabapentin (NEURONTIN) 300 MG capsule Take 1 capsule (300 mg total) by mouth at bedtime. 02/23/23   Gloris Manchester, MD  isosorbide mononitrate (IMDUR) 30 MG 24 hr tablet Take 30 mg by mouth daily. 11/30/22   [provider]  olmesartan (BENICAR) 5 MG tablet TAKE 1 TABLET (5 MG TOTAL) BY MOUTH DAILY. 04/23/23   Saralyn Pilar A, PA  ondansetron (ZOFRAN-ODT) 4  MG disintegrating tablet Take 1 tablet (4 mg total) by mouth every 8 (eight) hours as needed for nausea or vomiting. 04/03/23   Rondel Baton, MD  pantoprazole (PROTONIX) 40 MG tablet TAKE 1 TABLET BY MOUTH EVERY DAY 04/05/23   Hilarie Fredrickson, MD  polyethylene glycol (MIRALAX / GLYCOLAX) 17 g packet Take 17 g by mouth daily as needed for moderate constipation.    [provider]  rosuvastatin (CRESTOR) 40 MG tablet TAKE 1 TABLET BY MOUTH EVERY DAY 01/04/23   Rollene Rotunda, MD      Allergies    Other and Penicillins    Review of Systems   Review of Systems  Gastrointestinal:  Positive for abdominal pain.    Physical Exam Updated Vital Signs BP 125/69   Pulse (!) 47   Temp 98 F (36.7 C) (Oral)    Resp 18   Ht 5\' 6"  (1.676 m)   Wt 90.7 kg   SpO2 100%   BMI 32.28 kg/m  Physical Exam Vitals and nursing note reviewed.  Constitutional:      Appearance: She is well-developed.  HENT:     Head: Normocephalic and atraumatic.  Cardiovascular:     Rate and Rhythm: Normal rate and regular rhythm.  Pulmonary:     Effort: No respiratory distress.     Breath sounds: No stridor.  Abdominal:     General: There is no distension.  Musculoskeletal:     Cervical back: Normal range of motion.  Neurological:     Mental Status: She is alert.     ED Results / Procedures / Treatments   Labs (all labs ordered are listed, but only abnormal results are displayed) Labs Reviewed  LIPASE, BLOOD - Abnormal; Notable for the following components:      Result Value   Lipase 53 (*)    All other components within normal limits  COMPREHENSIVE METABOLIC PANEL - Abnormal; Notable for the following components:   Glucose, Bld 105 (*)    BUN 29 (*)    Creatinine, Ser 1.35 (*)    Total Protein 5.8 (*)    GFR, Estimated 42 (*)    All other components within normal limits  CBC - Abnormal; Notable for the following components:   RBC 3.76 (*)    Hemoglobin 11.8 (*)    All other components within normal limits  URINALYSIS, ROUTINE W REFLEX MICROSCOPIC    EKG EKG Interpretation Date/Time:  Sunday April 25 2023 19:38:46 EDT Ventricular Rate:  79 PR Interval:  152 QRS Duration:  96 QT Interval:  390 QTC Calculation: 447 R Axis:   -55  Text Interpretation: Sinus rhythm with Premature supraventricular complexes Left anterior fascicular block Abnormal ECG When compared with ECG of 10-Apr-2023 17:08, PREVIOUS ECG IS PRESENT Confirmed by Marily Memos [phone]) on 04/25/2023 11:14:39 PM  Radiology No results found.  Procedures Procedures    Medications Ordered in ED Medications  sucralfate (CARAFATE) 1 GM/10ML suspension 1 g (1 g Oral Given 04/25/23 2352)  famotidine (PEPCID) tablet 20 mg (has no  administration in time range)  ondansetron (ZOFRAN-ODT) disintegrating tablet 4 mg (4 mg Oral Given 04/25/23 1930)  oxyCODONE-acetaminophen (PERCOCET/ROXICET) 5-325 MG per tablet 1 tablet (1 tablet Oral Given 04/25/23 2352)  dicyclomine (BENTYL) capsule 10 mg (10 mg Oral Given 04/25/23 2352)    ED Course/ Medical Decision Making/ A&P  Medical Decision Making Amount and/or Complexity of Data Reviewed Labs: ordered.  Risk Prescription drug management.   Overall symptoms seem consistent with acute on chronic abdominal discomfort.  Restarted her home medications this seems to help some.  Low suspicion for recurrent diverticulitis without focal pain, fever, leukocytosis.  Doubt bowel obstruction as she had a bowel movement this morning. Labs are overall at baseline and reassuring.  Discussed follow-up with her gastroenterologist.     Final Clinical Impression(s) / ED Diagnoses Final diagnoses:  Generalized abdominal pain    Rx / DC Orders ED Discharge Orders          Ordered    dicyclomine (BENTYL) 20 MG tablet  2 times daily PRN        04/26/23 0104    sucralfate (CARAFATE) 1 GM/10ML suspension  3 times daily with meals & bedtime        04/26/23 0104    famotidine (PEPCID) 10 MG tablet  2 times daily        04/26/23 0104              , Barbara Cower, MD 04/26/23 0104

## 2023-05-03 ENCOUNTER — Emergency Department (HOSPITAL_COMMUNITY)
Admission: EM | Admit: 2023-05-03 | Discharge: 2023-05-03 | Disposition: A | Discharge status: Home / Self Care | Payer: BC Managed Care – PPO | Attending: Emergency Medicine | Admitting: Emergency Medicine

## 2023-05-03 DIAGNOSIS — Z7901 Long term (current) use of anticoagulants: Secondary | ICD-10-CM | POA: Diagnosis not present

## 2023-05-03 DIAGNOSIS — R001 Bradycardia, unspecified: Secondary | ICD-10-CM | POA: Insufficient documentation

## 2023-05-03 DIAGNOSIS — G8929 Other chronic pain: Secondary | ICD-10-CM | POA: Diagnosis not present

## 2023-05-03 DIAGNOSIS — R109 Unspecified abdominal pain: Secondary | ICD-10-CM | POA: Diagnosis present

## 2023-05-03 LAB — URINALYSIS, ROUTINE W REFLEX MICROSCOPIC
Bilirubin Urine: NEGATIVE
Glucose, UA: NEGATIVE mg/dL
Hgb urine dipstick: NEGATIVE
Ketones, ur: NEGATIVE mg/dL
Leukocytes,Ua: NEGATIVE
Nitrite: NEGATIVE
Protein, ur: NEGATIVE mg/dL
Specific Gravity, Urine: 1.01 (ref 1.005–1.030)
pH: 7 (ref 5.0–8.0)

## 2023-05-03 LAB — COMPREHENSIVE METABOLIC PANEL
ALT: 13 U/L (ref 0–44)
AST: 18 U/L (ref 15–41)
Albumin: 3.7 g/dL (ref 3.5–5.0)
Alkaline Phosphatase: 45 U/L (ref 38–126)
Anion gap: 9 (ref 5–15)
BUN: 25 mg/dL — ABNORMAL HIGH (ref 8–23)
CO2: 22 mmol/L (ref 22–32)
Calcium: 9.5 mg/dL (ref 8.9–10.3)
Chloride: 108 mmol/L (ref 98–111)
Creatinine, Ser: 1.22 mg/dL — ABNORMAL HIGH (ref 0.44–1.00)
GFR, Estimated: 48 mL/min — ABNORMAL LOW (ref >60)
Glucose, Bld: 105 mg/dL — ABNORMAL HIGH (ref 70–99)
Potassium: 4.3 mmol/L (ref 3.5–5.1)
Sodium: 139 mmol/L (ref 135–145)
Total Bilirubin: 0.5 mg/dL (ref 0.3–1.2)
Total Protein: 5.8 g/dL — ABNORMAL LOW (ref 6.5–8.1)

## 2023-05-03 LAB — CBC WITH DIFFERENTIAL/PLATELET
Abs Immature Granulocytes: 0.01 10*3/uL (ref 0.00–0.07)
Basophils Absolute: 0 10*3/uL (ref 0.0–0.1)
Basophils Relative: 0 %
Eosinophils Absolute: 0 10*3/uL (ref 0.0–0.5)
Eosinophils Relative: 0 %
HCT: 34.7 % — ABNORMAL LOW (ref 36.0–46.0)
Hemoglobin: 11.4 g/dL — ABNORMAL LOW (ref 12.0–15.0)
Immature Granulocytes: 0 %
Lymphocytes Relative: 34 %
Lymphs Abs: 1.6 10*3/uL (ref 0.7–4.0)
MCH: 31.9 pg (ref 26.0–34.0)
MCHC: 32.9 g/dL (ref 30.0–36.0)
MCV: 97.2 fL (ref 80.0–100.0)
Monocytes Absolute: 0.2 10*3/uL (ref 0.1–1.0)
Monocytes Relative: 5 %
Neutro Abs: 2.9 10*3/uL (ref 1.7–7.7)
Neutrophils Relative %: 61 %
Platelets: 150 10*3/uL (ref 150–400)
RBC: 3.57 MIL/uL — ABNORMAL LOW (ref 3.87–5.11)
RDW: 13 % (ref 11.5–15.5)
WBC: 4.7 10*3/uL (ref 4.0–10.5)
nRBC: 0 % (ref 0.0–0.2)

## 2023-05-03 LAB — LIPASE, BLOOD: Lipase: 43 U/L (ref 11–51)

## 2023-05-03 NOTE — ED Triage Notes (Signed)
Patient BIB [facility] from home, c/o dizziness, and ABD pain since noon.HR 50, Atropine 1mg  given. HR 74, 20gLeft wrist. 152/90, 74, 98%ra

## 2023-05-03 NOTE — Discharge Instructions (Signed)
It is very important that you go to your GI appointment tomorrow.  Keep taking all of your medications as prescribed.

## 2023-05-03 NOTE — ED Provider Notes (Signed)
[place] EMERGENCY DEPARTMENT AT [facility] Provider Note   CSN: [id number] Arrival date & time: 05/03/23  1747     History  Chief Complaint  Patient presents with   Abdominal Pain   Bradycardia    Rachel Black is a 72 y.o. female.  This is a 72 year old female here today for abdominal pain.  Patient with frequent visits to the emergency department for this abdominal pain, has follow-up with gastroenterology tomorrow.  Reportedly she had a brief period of a decreased heart rate with EMS.  They reported that her heart rate was 50, and they gave her atropine.  Looking back to the patient's chart, she is frequently bradycardic.  No syncope.   Abdominal Pain      Home Medications Prior to Admission medications   Medication Sig Start Date End Date Taking? Authorizing Provider  ACCU-CHEK AVIVA PLUS test strip USE AS INSTRUCTED ONCE A DAY. DX CODE E11.9 07/20/22   Mayer Masker, PA-C  Accu-Chek Softclix Lancets lancets Use as instructed once a day.  DX Code: E11.9 03/24/22   Mayer Masker, PA-C  acetaminophen (TYLENOL) 500 MG tablet Take 1,000 mg by mouth every 6 (six) hours as needed for mild pain or moderate pain.    [provider]  allopurinol (ZYLOPRIM) 300 MG tablet TAKE 1 TABLET BY MOUTH TWICE A DAY 03/04/23   Boscia, Heather E, NP  CALCIUM PO Take 2 tablets by mouth daily.    [provider]  dicyclomine (BENTYL) 20 MG tablet Take 1 tablet (20 mg total) by mouth 2 (two) times daily as needed for spasms (abdominal cramping). 04/26/23   Mesner, Barbara Cower, MD  ELIQUIS 5 MG TABS tablet TAKE 1 TABLET BY MOUTH TWICE A DAY 04/26/23   Saralyn Pilar A, PA  Emollient (UDDERLY SMOOTH) CREA Apply 1 application topically See admin instructions. Apply topically to skin folds as needed for rash/irritation    [provider]  famotidine (PEPCID) 10 MG tablet Take 1 tablet (10 mg total) by mouth 2 (two) times daily for 5 days. And then afterwards take as  needed for heartburn 04/26/23 05/01/23  Mesner, Barbara Cower, MD  fenofibrate 160 MG tablet TAKE 1 TABLET BY MOUTH EVERY DAY 03/04/23   Carlean Jews, NP  ferrous sulfate 325 (65 FE) MG tablet TAKE 1 TABLET BY MOUTH TWICE A DAY Patient taking differently: Take 325 mg by mouth every evening. 08/03/22   Zehr, Princella Pellegrini, PA-C  gabapentin (NEURONTIN) 300 MG capsule Take 1 capsule (300 mg total) by mouth at bedtime. 02/23/23   Gloris Manchester, MD  isosorbide mononitrate (IMDUR) 30 MG 24 hr tablet Take 30 mg by mouth daily. 11/30/22   [provider]  olmesartan (BENICAR) 5 MG tablet TAKE 1 TABLET (5 MG TOTAL) BY MOUTH DAILY. 04/23/23   Saralyn Pilar A, PA  ondansetron (ZOFRAN-ODT) 4 MG disintegrating tablet Take 1 tablet (4 mg total) by mouth every 8 (eight) hours as needed for nausea or vomiting. 04/03/23   Rondel Baton, MD  pantoprazole (PROTONIX) 40 MG tablet TAKE 1 TABLET BY MOUTH EVERY DAY 04/05/23   Hilarie Fredrickson, MD  polyethylene glycol (MIRALAX / GLYCOLAX) 17 g packet Take 17 g by mouth daily as needed for moderate constipation.    [provider]  rosuvastatin (CRESTOR) 40 MG tablet TAKE 1 TABLET BY MOUTH EVERY DAY 01/04/23   Rollene Rotunda, MD  sucralfate (CARAFATE) 1 GM/10ML suspension Take 10 mLs (1 g total) by mouth 4 (four) times  daily -  with meals and at bedtime. 04/26/23   Mesner, Barbara Cower, MD      Allergies    Other and Penicillins    Review of Systems   Review of Systems  Gastrointestinal:  Positive for abdominal pain.    Physical Exam Updated Vital Signs BP 132/67   Pulse (!) 53   Temp 98.7 F (37.1 C) (Oral)   Resp 17   SpO2 99%  Physical Exam Vitals reviewed.  Abdominal:     General: Abdomen is flat.     Palpations: Abdomen is soft.     Tenderness: There is no abdominal tenderness. There is no guarding. Negative signs include Murphy's sign.  Neurological:     Mental Status: She is alert.     ED Results / Procedures / Treatments   Labs (all labs  ordered are listed, but only abnormal results are displayed) Labs Reviewed  CBC WITH DIFFERENTIAL/PLATELET - Abnormal; Notable for the following components:      Result Value   RBC 3.57 (*)    Hemoglobin 11.4 (*)    HCT 34.7 (*)    All other components within normal limits  COMPREHENSIVE METABOLIC PANEL - Abnormal; Notable for the following components:   Glucose, Bld 105 (*)    BUN 25 (*)    Creatinine, Ser 1.22 (*)    Total Protein 5.8 (*)    GFR, Estimated 48 (*)    All other components within normal limits  URINALYSIS, ROUTINE W REFLEX MICROSCOPIC - Abnormal; Notable for the following components:   Color, Urine STRAW (*)    All other components within normal limits  LIPASE, BLOOD    EKG None  Radiology No results found.  Procedures Procedures    Medications Ordered in ED Medications - No data to display  ED Course/ Medical Decision Making/ A&P                                 Medical Decision Making This is a 72 year old female here today for abdominal pain.  Differential diagnoses include chronic abdominal pain, chronic bradycardia.  Plan -patient has had 10 visits to the emergency department in the last 1 month for abdominal pain, in the last 2 months she has had 6 CT scans of the abdomen pelvis.  I do not believe a repeat CT imaging would be beneficial for this patient.  Patient has an appointment with gastroenterology tomorrow.  When I discussed with this patient how this would be beneficial for her chronic abdominal pain, the patient told me that she did not plan to go to the appointment.  When asked why the patient was not planning to go to her gastroenterology appointment tomorrow, she said "I just do not feel good.  "  I tried to reason with the patient and explained to her why this was an important appointment for her to go to so that there could be additional testing done on an outpatient basis for her abdominal pain that we do not have in the emergency  department.  Tried multiple different approaches to this, and tried to impress upon the patient the importance of this appointment, however she still says that she does not plan to go.  I do not believe the patient requires repeat imaging.  I ordered routine labs, that with were within her normal limits.  She was monitored in our emergency department, did not have any episodes of profound  bradycardia, certainly none that were unusual for her.  I am going to discharge the patient.  I am hopeful that she will go to her GI appointment tomorrow.  Amount and/or Complexity of Data Reviewed Labs: ordered. Radiology: ordered.           Final Clinical Impression(s) / ED Diagnoses Final diagnoses:  Chronic abdominal pain    Rx / DC Orders ED Discharge Orders     None         Arletha Pili, DO 05/03/23 2143

## 2023-05-04 ENCOUNTER — Ambulatory Visit: Payer: BC Managed Care – PPO | Admitting: Internal Medicine

## 2023-05-04 ENCOUNTER — Ambulatory Visit (INDEPENDENT_AMBULATORY_CARE_PROVIDER_SITE_OTHER): Payer: BC Managed Care – PPO | Admitting: Internal Medicine

## 2023-05-04 ENCOUNTER — Encounter: Payer: Self-pay | Admitting: Internal Medicine

## 2023-05-04 VITALS — BP 130/70 | HR 60 | Ht 66.0 in

## 2023-05-04 DIAGNOSIS — R109 Unspecified abdominal pain: Secondary | ICD-10-CM

## 2023-05-04 DIAGNOSIS — F419 Anxiety disorder, unspecified: Secondary | ICD-10-CM

## 2023-05-04 DIAGNOSIS — F32A Depression, unspecified: Secondary | ICD-10-CM | POA: Diagnosis not present

## 2023-05-04 DIAGNOSIS — G8929 Other chronic pain: Secondary | ICD-10-CM

## 2023-05-04 NOTE — Progress Notes (Signed)
HISTORY OF PRESENT ILLNESS:  Rachel Black is a 72 y.o. female who presents today at the urging of the emergency room regarding chronic abdominal pain.  She has been seen innumerable times for the same.  Extensive workups have been negative.  My feeling in the past has been that she suffers from chronic functional abdominal pain associated with anxiety and depression.  Most recent evaluations include colonoscopy January 2003, upper endoscopy July 2023, contrast-enhanced CT scan of the abdomen and pelvis July 2024 and a myriad of blood work.  Failure to respond to empiric therapy such as dicyclomine, famotidine, sucralfate, and pantoprazole.  She is status post cholecystectomy as well as ERCP with sphincterotomy and common duct stone extraction.  She does have a history of GERD.  Patient tells me that she has abdominal pain "all the time".  Her description is very vague.  No exacerbating or relieving factors.  She is accompanied today by her husband.  He tells me that she contacts him at work stating that she feels like she is going to have a heart attack or die.  He comes home and takes her to the emergency room.  The complaints today are dizziness (which she has had) and "feeling yucky all over".  Her husband states that she has no interest in activities, leaving the home, or meals.  Sleeps poorly.  REVIEW OF SYSTEMS:  All non-GI ROS negative unless otherwise stated in the HPI except for anxiety, confusion, depression, fatigue, headaches, urinary leakage, sleeping problems  Past Medical History:  Diagnosis Date   Arthritis    PAIN AND OA BOTH KNEES AND SHOULDERS AND ELBOWS AND WRIST   Blood transfusion without reported diagnosis 2018   after cholecystectomy   Broken foot, right, closed, initial encounter 09/15/2021   no surgery needed, fell at home and went to ED   Diabetes mellitus    ORAL MEDICATION   Diverticulitis    GERD (gastroesophageal reflux disease)    Gout    NO RECENT FLARE UPS    H/O: rheumatic fever    AS A CHILD - NO KNOWN HEART MURMUR OR HEART PROBLEMS   Heart attack (HCC)    Hyperlipidemia    Hypertension    PFO (patent foramen ovale)    Stroke (HCC) 03/2017    Past Surgical History:  Procedure Laterality Date   ABDOMINAL HYSTERECTOMY     CESAREAN SECTION     CHOLECYSTECTOMY N/A 12/09/2016   Procedure: LAPAROSCOPIC CHOLECYSTECTOMY WITH INTRAOPERATIVE CHOLANGIOGRAM;  Surgeon: Almond Lint, MD;  Location: [facility];  Service: General;  Laterality: N/A;   COLONOSCOPY     COLONOSCOPY WITH PROPOFOL N/A 01/30/2013   Procedure: COLONOSCOPY WITH PROPOFOL;  Surgeon: Hilarie Fredrickson, MD;  Location: [facility];  Service: Endoscopy;  Laterality: N/A;   ERCP N/A 12/08/2016   Procedure: ENDOSCOPIC RETROGRADE CHOLANGIOPANCREATOGRAPHY (ERCP);  Surgeon: Meryl Dare, MD;  Location: [facility];  Service: Endoscopy;  Laterality: N/A;   ESOPHAGOGASTRODUODENOSCOPY (EGD) WITH PROPOFOL N/A 01/30/2013   Procedure: ESOPHAGOGASTRODUODENOSCOPY (EGD) WITH PROPOFOL;  Surgeon: Hilarie Fredrickson, MD;  Location: [facility];  Service: Endoscopy;  Laterality: N/A;   LEFT HEART CATH AND CORONARY ANGIOGRAPHY N/A 10/18/2017   Procedure: LEFT HEART CATH AND CORONARY ANGIOGRAPHY;  Surgeon: Swaziland, Peter M, MD;  Location: [facility] INVASIVE CV LAB;  Service: Cardiovascular;  Laterality: N/A;   LOOP RECORDER INSERTION N/A 03/18/2017   Procedure: Loop Recorder Insertion;  Surgeon: Regan Lemming, MD;  Location: [facility] INVASIVE CV LAB;  Service: Cardiovascular;  Laterality: N/A;   PARTIAL HYSTERECTOMY     TEE WITHOUT CARDIOVERSION N/A 03/16/2017   Procedure: TRANSESOPHAGEAL ECHOCARDIOGRAM (TEE);  Surgeon: Elease Hashimoto Deloris Ping, MD;  Location: [facility] END[facility];  Service: Cardiovascular;  Laterality: N/A;   TOTAL KNEE ARTHROPLASTY Right 04/20/2014   Procedure: RIGHT TOTAL KNEE ARTHROPLASTY CONVERTED TO RIGHT KNEE REIMPLANTATION;  Surgeon: Kathryne Hitch, MD;  Location: [facility];  Service: Orthopedics;   Laterality: Right;   TOTAL KNEE ARTHROPLASTY Left 08/23/2015   Procedure: LEFT TOTAL KNEE ARTHROPLASTY;  Surgeon: Kathryne Hitch, MD;  Location: [facility];  Service: Orthopedics;  Laterality: Left;   VENTRAL HERNIA REPAIR     x2    Social History Rachel Black  reports that she has never smoked. She has never been exposed to tobacco smoke. She has never used smokeless tobacco. She reports that she does not currently use alcohol. She reports that she does not use drugs.  family history includes Breast cancer in her mother; Crohn's disease in her son; Diabetes in her father and mother; Heart disease in her father; Hyperlipidemia in her father; Hypertension in her mother; Irritable bowel syndrome in her son; Stroke in her father.  Allergies  Allergen Reactions   Other Other (See Comments)    Some BANDAIDS cause skin irritation    Penicillins Itching     Tolerated Zosyn couse 10/04/22       PHYSICAL EXAMINATION: Vital signs: BP 130/70   Pulse 60   Ht 5\' 6"  (1.676 m)   BMI 32.28 kg/m   Constitutional: Apathetic and sitting in a wheelchair, no acute distress Psychiatric: alert and oriented x3, cooperative Eyes: extraocular movements intact, anicteric, conjunctiva pink Mouth: oral pharynx moist, no lesions Neck: supple no lymphadenopathy Cardiovascular: heart regular rate and rhythm, no murmur Lungs: clear to auscultation bilaterally Abdomen: soft, complains of tenderness with superficial palpation, nondistended, no obvious ascites, no peritoneal signs, normal bowel sounds, no organomegaly Rectal: Omitted Extremities: no clubbing, cyanosis, or lower extremity edema bilaterally Skin: no lesions on visible extremities Neuro: No focal deficits.  Cranial nerves intact  ASSESSMENT:  1.  Chronic functional abdominal pain associated with anxiety/depression 2.  Extensive workup for abdominal pain has been negative. 3.  GERD.  On PPI 4.  History of colon polyps.  Surveillance  up-to-date 5.  General Medical problems.   PLAN:  1.  I had a long and frank discussion with the patient and her husband regarding her abdominal pain.  I feel like she needs aggressive assessment and treatment for anxiety and depression.  No further plans or indication for GI assessment until this is completed. 2.  Reflux precautions 3.  Continue PPI 4.  Surveillance colonoscopy around January 2026 5.  Resume general medical care with PCP Total time of 30 minutes was spent preparing to see the patient (the majority of the time), obtaining interval history, performing medically appropriate physical examination, counseling and educating patient and her husband regarding the above listed issues, and documenting clinical information in the health record.

## 2023-05-04 NOTE — Patient Instructions (Signed)
Please follow up with your primary care physician.  _______________________________________________________  If your blood pressure at your visit was 140/90 or greater, please contact your primary care physician to follow up on this.  _______________________________________________________  If you are age 71 or older, your body mass index should be between 23-30. Your Body mass index is 32.28 kg/m. If this is out of the aforementioned range listed, please consider follow up with your Primary Care Provider.  If you are age 76 or younger, your body mass index should be between 19-25. Your Body mass index is 32.28 kg/m. If this is out of the aformentioned range listed, please consider follow up with your Primary Care Provider.   ________________________________________________________  The [place] [facility] providers would like to encourage you to use [facility] to communicate with providers for non-urgent requests or questions.  Due to long hold times on the telephone, sending your provider a message by [facility] may be a faster and more efficient way to get a response.  Please allow 48 business hours for a response.  Please remember that this is for non-urgent requests.  _______________________________________________________

## 2023-05-06 ENCOUNTER — Other Ambulatory Visit: Payer: BC Managed Care – PPO

## 2023-05-06 ENCOUNTER — Other Ambulatory Visit: Payer: Self-pay

## 2023-05-06 ENCOUNTER — Emergency Department (HOSPITAL_COMMUNITY): Payer: BC Managed Care – PPO

## 2023-05-06 ENCOUNTER — Emergency Department (HOSPITAL_COMMUNITY)
Admission: EM | Admit: 2023-05-06 | Discharge: 2023-05-06 | Disposition: A | Discharge status: Home / Self Care | Payer: BC Managed Care – PPO | Source: Home / Self Care

## 2023-05-06 ENCOUNTER — Encounter (HOSPITAL_COMMUNITY): Payer: Self-pay

## 2023-05-06 ENCOUNTER — Emergency Department (HOSPITAL_COMMUNITY)
Admission: EM | Admit: 2023-05-06 | Discharge: 2023-05-06 | Disposition: A | Discharge status: Home / Self Care | Payer: BC Managed Care – PPO | Source: Home / Self Care | Attending: Emergency Medicine | Admitting: Emergency Medicine

## 2023-05-06 ENCOUNTER — Encounter (HOSPITAL_COMMUNITY): Payer: Self-pay | Admitting: Emergency Medicine

## 2023-05-06 DIAGNOSIS — F419 Anxiety disorder, unspecified: Secondary | ICD-10-CM | POA: Insufficient documentation

## 2023-05-06 DIAGNOSIS — Z09 Encounter for follow-up examination after completed treatment for conditions other than malignant neoplasm: Secondary | ICD-10-CM

## 2023-05-06 DIAGNOSIS — E1169 Type 2 diabetes mellitus with other specified complication: Secondary | ICD-10-CM

## 2023-05-06 DIAGNOSIS — G894 Chronic pain syndrome: Secondary | ICD-10-CM | POA: Diagnosis not present

## 2023-05-06 DIAGNOSIS — R5381 Other malaise: Secondary | ICD-10-CM | POA: Diagnosis not present

## 2023-05-06 DIAGNOSIS — R1084 Generalized abdominal pain: Secondary | ICD-10-CM | POA: Insufficient documentation

## 2023-05-06 DIAGNOSIS — R1013 Epigastric pain: Secondary | ICD-10-CM | POA: Diagnosis present

## 2023-05-06 DIAGNOSIS — R0789 Other chest pain: Secondary | ICD-10-CM | POA: Diagnosis not present

## 2023-05-06 DIAGNOSIS — I152 Hypertension secondary to endocrine disorders: Secondary | ICD-10-CM

## 2023-05-06 DIAGNOSIS — R42 Dizziness and giddiness: Secondary | ICD-10-CM | POA: Diagnosis not present

## 2023-05-06 LAB — BASIC METABOLIC PANEL
Anion gap: 7 (ref 5–15)
BUN: 22 mg/dL (ref 8–23)
CO2: 25 mmol/L (ref 22–32)
Calcium: 9.5 mg/dL (ref 8.9–10.3)
Chloride: 107 mmol/L (ref 98–111)
Creatinine, Ser: 1.09 mg/dL — ABNORMAL HIGH (ref 0.44–1.00)
GFR, Estimated: 55 mL/min — ABNORMAL LOW (ref >60)
Glucose, Bld: 95 mg/dL (ref 70–99)
Potassium: 3.8 mmol/L (ref 3.5–5.1)
Sodium: 139 mmol/L (ref 135–145)

## 2023-05-06 LAB — CBC
HCT: 34.8 % — ABNORMAL LOW (ref 36.0–46.0)
Hemoglobin: 11.6 g/dL — ABNORMAL LOW (ref 12.0–15.0)
MCH: 32 pg (ref 26.0–34.0)
MCHC: 33.3 g/dL (ref 30.0–36.0)
MCV: 95.9 fL (ref 80.0–100.0)
Platelets: 158 10*3/uL (ref 150–400)
RBC: 3.63 MIL/uL — ABNORMAL LOW (ref 3.87–5.11)
RDW: 13.2 % (ref 11.5–15.5)
WBC: 4.7 10*3/uL (ref 4.0–10.5)
nRBC: 0 % (ref 0.0–0.2)

## 2023-05-06 LAB — TROPONIN I (HIGH SENSITIVITY)
Troponin I (High Sensitivity): 6 ng/L (ref <18)
Troponin I (High Sensitivity): 6 ng/L (ref <18)

## 2023-05-06 MED ORDER — ONDANSETRON 4 MG PO TBDP
4.0000 mg | ORAL_TABLET | Freq: Once | ORAL | Status: AC
  Administered 2023-05-06: 4 mg via ORAL
  Filled 2023-05-06: qty 1

## 2023-05-06 NOTE — ED Notes (Signed)
Pt in xray

## 2023-05-06 NOTE — ED Triage Notes (Signed)
Pt c/o generalized abd pain x 1 year and chest palpitations and dizziness since this am; endorses some nausea and sob; denies fevers, no urinary symptoms; hx DM, HTN

## 2023-05-06 NOTE — ED Notes (Signed)
Pt upset about discharge. 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 Pt requesting to be admitted to the hospital. Explained to the pt multiple times that we are not able to admit her at this time. Pt assisted into wheelchair and taken to ED entrance. Pt discharged from ED.

## 2023-05-06 NOTE — ED Provider Notes (Signed)
[place] EMERGENCY DEPARTMENT AT [facility] Provider Note   CSN: [id number] Arrival date & time: 05/06/23  1450     History Chief Complaint  Patient presents with   Pain    HPI Rachel Black is a 72 y.o. female presenting for diffuse pain..  39 visits for similar presentation of abdominal pain acute on chronic.  Seen this morning at [facility] lab work done no acute pathology detected.  Patient deemed stable for outpatient care management. Comes to [facility] for second opinion.  Denies any new symptoms.  Denies fevers or chills endorses nausea without vomiting endorses normal p.o. intake endorses chronic diffuse abdominal pain.  Patient's recorded medical, surgical, social, medication list and allergies were reviewed in the Snapshot window as part of the initial history.   Review of Systems   Review of Systems  Constitutional:  Negative for chills and fever.  HENT:  Negative for ear pain and sore throat.   Eyes:  Negative for pain and visual disturbance.  Respiratory:  Negative for cough and shortness of breath.   Cardiovascular:  Negative for chest pain and palpitations.  Gastrointestinal:  Negative for abdominal pain and vomiting.  Genitourinary:  Negative for dysuria and hematuria.  Musculoskeletal:  Negative for arthralgias and back pain.  Skin:  Negative for color change and rash.  Neurological:  Negative for seizures and syncope.  All other systems reviewed and are negative.   Physical Exam Updated Vital Signs BP 122/81   Pulse 65   Temp 98.4 F (36.9 C) (Oral)   Resp 16   Ht 5\' 6"  (1.676 m)   Wt 91 kg   SpO2 100%   BMI 32.38 kg/m  Physical Exam Vitals and nursing note reviewed.  Constitutional:      General: She is not in acute distress.    Appearance: She is well-developed.  HENT:     Head: Normocephalic and atraumatic.  Eyes:     Conjunctiva/sclera: Conjunctivae normal.  Cardiovascular:     Rate and Rhythm: Normal rate and  regular rhythm.     Heart sounds: No murmur heard. Pulmonary:     Effort: Pulmonary effort is normal. No respiratory distress.     Breath sounds: Normal breath sounds.  Abdominal:     General: There is no distension.     Palpations: Abdomen is soft.     Tenderness: There is no abdominal tenderness. There is no right CVA tenderness or left CVA tenderness.  Musculoskeletal:        General: No swelling or tenderness. Normal range of motion.     Cervical back: Neck supple.  Skin:    General: Skin is warm and dry.  Neurological:     General: No focal deficit present.     Mental Status: She is alert and oriented to person, place, and time. Mental status is at baseline.     Cranial Nerves: No cranial nerve deficit.      ED Course/ Medical Decision Making/ A&P    Procedures Procedures   Medications Ordered in ED Medications - No data to display  Medical Decision Making:   Well-appearing 72 year old female presenting with a chief complaint of a year of abdominal pain.  Saw GI less than a week ago he stated that this is a psychiatric etiology and is concern for functional gastric disease. 40 visits to the emergency department over the last 6 months without focal pathology detected over 15 CTs extensive serial testing including recent lab  testing this morning without cardiac etiology, biliary etiology, metabolic etiology or other focal pathology detected. She does not appear dehydrated.  She went and got food between leaving the emergency department this morning and coming here. Reviewed the outside GI notes.  Recommended psychiatric follow-up.  Patient has not called and followed up with her primary care doctor in months.  Recommended she go back to her primary care doctor that as soon as possible for ongoing care and management of her chronic abdominal pain.  No acute indication for further intervention in the emergency room. Disposition:  I have considered need for hospitalization,  however, considering all of the above, I believe this patient is stable for discharge at this time.  Patient/family educated about specific return precautions for given chief complaint and symptoms.  Patient/family educated about follow-up with PCP and psychiatry.     Patient/family expressed understanding of return precautions and need for follow-up. Patient spoken to regarding all imaging and laboratory results and appropriate follow up for these results. All education provided in verbal form with additional information in written form. Time was allowed for answering of patient questions. Patient discharged.    Emergency Department Medication Summary:   Medications - No data to display       Clinical Impression:  1. Chronic pain syndrome      Discharge   Final Clinical Impression(s) / ED Diagnoses Final diagnoses:  Chronic pain syndrome    Rx / DC Orders ED Discharge Orders     None         Glyn Ade, MD 05/06/23 1745

## 2023-05-06 NOTE — ED Provider Notes (Signed)
[place] EMERGENCY DEPARTMENT AT [facility] Provider Note   CSN: [id number] Arrival date & time: 05/06/23  0932     History  Chief Complaint  Patient presents with   Dizziness   Abdominal Pain    Rachel Black is a 72 y.o. female.  Presented to the emergency department with generalized malaise and not feeling well.  Reports started having palpitations/fluttering sensation in her chest with some mild discomfort after they took blood work at her PCP.  She also complains of the same epigastric abdominal pain that has been ongoing for the past year and a half.  She reports that her chest discomfort and fluttering sensation is been ongoing off and on for quite some time.  Low risk for PE based on Wells score  HPI     Home Medications Prior to Admission medications   Medication Sig Start Date End Date Taking? Authorizing Provider  ACCU-CHEK AVIVA PLUS test strip USE AS INSTRUCTED ONCE A DAY. DX CODE E11.9 07/20/22   Mayer Masker, PA-C  Accu-Chek Softclix Lancets lancets Use as instructed once a day.  DX Code: E11.9 03/24/22   Mayer Masker, PA-C  acetaminophen (TYLENOL) 500 MG tablet Take 1,000 mg by mouth every 6 (six) hours as needed for mild pain or moderate pain.    [provider]  allopurinol (ZYLOPRIM) 300 MG tablet TAKE 1 TABLET BY MOUTH TWICE A DAY 03/04/23   Boscia, Heather E, NP  CALCIUM PO Take 2 tablets by mouth daily.    [provider]  dicyclomine (BENTYL) 20 MG tablet Take 1 tablet (20 mg total) by mouth 2 (two) times daily as needed for spasms (abdominal cramping). 04/26/23   Mesner, Barbara Cower, MD  ELIQUIS 5 MG TABS tablet TAKE 1 TABLET BY MOUTH TWICE A DAY 04/26/23   Saralyn Pilar A, PA  Emollient (UDDERLY SMOOTH) CREA Apply 1 application topically See admin instructions. Apply topically to skin folds as needed for rash/irritation    [provider]  famotidine (PEPCID) 10 MG tablet Take 1 tablet (10 mg total) by mouth 2 (two)  times daily for 5 days. And then afterwards take as needed for heartburn 04/26/23 05/01/23  Mesner, Barbara Cower, MD  fenofibrate 160 MG tablet TAKE 1 TABLET BY MOUTH EVERY DAY 03/04/23   Carlean Jews, NP  ferrous sulfate 325 (65 FE) MG tablet TAKE 1 TABLET BY MOUTH TWICE A DAY Patient taking differently: Take 325 mg by mouth every evening. 08/03/22   Zehr, Princella Pellegrini, PA-C  gabapentin (NEURONTIN) 300 MG capsule Take 1 capsule (300 mg total) by mouth at bedtime. 02/23/23   Gloris Manchester, MD  isosorbide mononitrate (IMDUR) 30 MG 24 hr tablet Take 30 mg by mouth daily. 11/30/22   [provider]  olmesartan (BENICAR) 5 MG tablet TAKE 1 TABLET (5 MG TOTAL) BY MOUTH DAILY. 04/23/23   Saralyn Pilar A, PA  ondansetron (ZOFRAN-ODT) 4 MG disintegrating tablet Take 1 tablet (4 mg total) by mouth every 8 (eight) hours as needed for nausea or vomiting. 04/03/23   Rondel Baton, MD  pantoprazole (PROTONIX) 40 MG tablet TAKE 1 TABLET BY MOUTH EVERY DAY 04/05/23   Hilarie Fredrickson, MD  polyethylene glycol (MIRALAX / GLYCOLAX) 17 g packet Take 17 g by mouth daily as needed for moderate constipation.    [provider]  rosuvastatin (CRESTOR) 40 MG tablet TAKE 1 TABLET BY MOUTH EVERY DAY 01/04/23   Rollene Rotunda, MD  sucralfate (CARAFATE) 1 GM/10ML suspension Take  10 mLs (1 g total) by mouth 4 (four) times daily -  with meals and at bedtime. 04/26/23   Mesner, Barbara Cower, MD      Allergies    Other and Penicillins    Review of Systems   Review of Systems  Physical Exam Updated Vital Signs BP (!) 175/69   Pulse (!) 47   Temp 98.2 F (36.8 C) (Oral)   Resp 17   Ht 5\' 6"  (1.676 m)   Wt 90.7 kg   SpO2 100%   BMI 32.27 kg/m  Physical Exam Vitals and nursing note reviewed.  Constitutional:      General: She is not in acute distress.    Appearance: She is not toxic-appearing.  HENT:     Head: Normocephalic and atraumatic.     Nose: Nose normal.     Mouth/Throat:     Mouth: Mucous membranes are  moist.  Eyes:     Conjunctiva/sclera: Conjunctivae normal.  Cardiovascular:     Rate and Rhythm: Normal rate and regular rhythm.  Pulmonary:     Effort: Pulmonary effort is normal.     Breath sounds: Normal breath sounds.  Abdominal:     General: Abdomen is flat. There is no distension.     Tenderness: There is no abdominal tenderness. There is no guarding or rebound.  Musculoskeletal:     Right lower leg: No edema.     Left lower leg: No edema.  Skin:    General: Skin is warm and dry.     Capillary Refill: Capillary refill takes less than 2 seconds.  Neurological:     Mental Status: She is alert and oriented to person, place, and time.  Psychiatric:     Comments: Anxious appearing     ED Results / Procedures / Treatments   Labs (all labs ordered are listed, but only abnormal results are displayed) Labs Reviewed  BASIC METABOLIC PANEL - Abnormal; Notable for the following components:      Result Value   Creatinine, Ser 1.09 (*)    GFR, Estimated 55 (*)    All other components within normal limits  CBC - Abnormal; Notable for the following components:   RBC 3.63 (*)    Hemoglobin 11.6 (*)    HCT 34.8 (*)    All other components within normal limits  TROPONIN I (HIGH SENSITIVITY)  TROPONIN I (HIGH SENSITIVITY)    EKG None  Radiology DG Chest 2 View  Result Date: 05/06/2023 CLINICAL DATA:  Chest pain. EXAM: CHEST - 2 VIEW COMPARISON:  Chest x-ray 04/10/2023 and older FINDINGS: The heart size and mediastinal contours are within normal limits. No consolidation, pneumothorax or effusion. No edema. There is minimal left mid to lower lung focus of scar which is unchanged from prior versus atelectasis. The visualized skeletal structures are unremarkable. IMPRESSION: No acute cardiopulmonary disease. Electronically Signed   By: Karen Kays M.D.   On: 05/06/2023 10:21    Procedures Procedures    Medications Ordered in ED Medications  ondansetron (ZOFRAN-ODT)  disintegrating tablet 4 mg (4 mg Oral Given 05/06/23 1130)    ED Course/ Medical Decision Making/ A&P                                 Medical Decision Making This is a 72 year old female well-known to the emergency department.  She is presented roughly 10 times in the past month for similar complaint that  she has today.  There is seemingly no acute change in her waxing and waning symptoms compared to prior evaluations.  She has had 10 CT scans of her abdomen and 1 CTA of her abdomen that have all been reassuring.  They followed up with her GI doctor, but were unable to elaborate on any specific recommendations that they made.  Patient complaining of fluttering sensation/palpitations.  Appears to be sinus bradycardic, again per chart review it appears this is not a new phenomenon for her.  Her EKG appears to be similar to prior.  She is hemodynamically stable.  Labs today without leukocytosis to suggest systemic infection.  Stable chronic anemia.  No significant metabolic derangements on her metabolic panel.  Troponin is negative x 2.  ACS unlikely.  Low suspicion for PE.  She complained of some nausea was given Zofran. Abdomen is soft and non-tender on exam.  Her orthostatic vital signs reassuring.  I believe there is a component of anxiety to her presentation.  Discussed follow-up with her primary doctor and possible psychiatry.  Husband is at bedside and reports that patient seemingly has waxing and waning complaints and her complaints today are not out of the ordinary.  Amount and/or Complexity of Data Reviewed External Data Reviewed: notes.    Details: Been evaluated by cardiology with cardiac monitoring which were negative. Labs: ordered. Radiology: ordered.  Risk Prescription drug management. Decision regarding hospitalization. Risk Details: Patient labs, vital signs and history largely reassuring.  I do not think she would benefit from acute hospitalization at this point in  time.          Final Clinical Impression(s) / ED Diagnoses Final diagnoses:  Epigastric pain    Rx / DC Orders ED Discharge Orders     None         Coral Spikes, DO 05/06/23 1411

## 2023-05-06 NOTE — ED Triage Notes (Signed)
Patient said she was not admitted at [facility] so she came here because she needs to be admitted. She said she is still in pain all over and feeling bad. Had labs drawn earlier today.

## 2023-05-06 NOTE — Discharge Instructions (Signed)
Please follow-up with your primary doctor as soon as possible.

## 2023-05-06 NOTE — Discharge Instructions (Signed)
You are seen today for chronic abdominal pain.  You have already seen a specialist for this condition and has had extensive emergency department evaluation.  Your studies have been serially negative.  Including lab work this morning which I reviewed. I have attached a list of local psychiatric providers and you should call your primary care provider for 48-hour follow-up for ongoing care and management of your condition.  You may call the phone number if connected if you would like a second opinion for PCP.

## 2023-05-07 LAB — COMPREHENSIVE METABOLIC PANEL
ALT: 10 IU/L (ref 0–32)
AST: 15 IU/L (ref 0–40)
Albumin: 4.2 g/dL (ref 3.9–4.9)
Alkaline Phosphatase: 59 IU/L (ref 44–121)
BUN/Creatinine Ratio: 21 (ref 12–28)
BUN: 23 mg/dL (ref 8–27)
Bilirubin Total: 0.4 mg/dL (ref 0.0–1.2)
CO2: 23 mmol/L (ref 20–29)
Calcium: 9.6 mg/dL (ref 8.7–10.3)
Chloride: 106 mmol/L (ref 96–106)
Creatinine, Ser: 1.08 mg/dL — ABNORMAL HIGH (ref 0.57–1.00)
Globulin, Total: 1.5 g/dL (ref 1.5–4.5)
Glucose: 86 mg/dL (ref 70–99)
Potassium: 4.1 mmol/L (ref 3.5–5.2)
Sodium: 141 mmol/L (ref 134–144)
Total Protein: 5.7 g/dL — ABNORMAL LOW (ref 6.0–8.5)
eGFR: 55 mL/min/{1.73_m2} — ABNORMAL LOW (ref >59)

## 2023-05-07 LAB — CBC
Hematocrit: 35 % (ref 34.0–46.6)
Hemoglobin: 11.8 g/dL (ref 11.1–15.9)
MCH: 32 pg (ref 26.6–33.0)
MCHC: 33.7 g/dL (ref 31.5–35.7)
MCV: 95 fL (ref 79–97)
Platelets: 168 10*3/uL (ref 150–450)
RBC: 3.69 x10E6/uL — ABNORMAL LOW (ref 3.77–5.28)
RDW: 13 % (ref 11.7–15.4)
WBC: 4.8 10*3/uL (ref 3.4–10.8)

## 2023-05-07 LAB — LIPID PANEL
Chol/HDL Ratio: 2.9 ratio (ref 0.0–4.4)
Cholesterol, Total: 118 mg/dL (ref 100–199)
HDL: 41 mg/dL (ref >39)
LDL Chol Calc (NIH): 60 mg/dL (ref 0–99)
Triglycerides: 84 mg/dL (ref 0–149)
VLDL Cholesterol Cal: 17 mg/dL (ref 5–40)

## 2023-05-07 LAB — HEMOGLOBIN A1C
Est. average glucose Bld gHb Est-mCnc: 108 mg/dL
Hgb A1c MFr Bld: 5.4 % (ref 4.8–5.6)

## 2023-05-09 ENCOUNTER — Emergency Department (HOSPITAL_COMMUNITY)
Admission: EM | Admit: 2023-05-09 | Discharge: 2023-05-09 | Disposition: A | Discharge status: Home / Self Care | Payer: BC Managed Care – PPO | Attending: Emergency Medicine | Admitting: Emergency Medicine

## 2023-05-09 ENCOUNTER — Other Ambulatory Visit: Payer: Self-pay

## 2023-05-09 ENCOUNTER — Encounter (HOSPITAL_COMMUNITY): Payer: Self-pay | Admitting: Emergency Medicine

## 2023-05-09 DIAGNOSIS — R42 Dizziness and giddiness: Secondary | ICD-10-CM | POA: Insufficient documentation

## 2023-05-09 DIAGNOSIS — Z7901 Long term (current) use of anticoagulants: Secondary | ICD-10-CM | POA: Diagnosis not present

## 2023-05-09 DIAGNOSIS — R109 Unspecified abdominal pain: Secondary | ICD-10-CM | POA: Insufficient documentation

## 2023-05-09 DIAGNOSIS — R531 Weakness: Secondary | ICD-10-CM | POA: Diagnosis present

## 2023-05-09 DIAGNOSIS — I2510 Atherosclerotic heart disease of native coronary artery without angina pectoris: Secondary | ICD-10-CM | POA: Diagnosis not present

## 2023-05-09 DIAGNOSIS — G8929 Other chronic pain: Secondary | ICD-10-CM | POA: Diagnosis not present

## 2023-05-09 LAB — CBC WITH DIFFERENTIAL/PLATELET
Abs Immature Granulocytes: 0.03 10*3/uL (ref 0.00–0.07)
Basophils Absolute: 0 10*3/uL (ref 0.0–0.1)
Basophils Relative: 1 %
Eosinophils Absolute: 0 10*3/uL (ref 0.0–0.5)
Eosinophils Relative: 0 %
HCT: 34.2 % — ABNORMAL LOW (ref 36.0–46.0)
Hemoglobin: 11.3 g/dL — ABNORMAL LOW (ref 12.0–15.0)
Immature Granulocytes: 1 %
Lymphocytes Relative: 29 %
Lymphs Abs: 1.4 10*3/uL (ref 0.7–4.0)
MCH: 31.7 pg (ref 26.0–34.0)
MCHC: 33 g/dL (ref 30.0–36.0)
MCV: 95.8 fL (ref 80.0–100.0)
Monocytes Absolute: 0.2 10*3/uL (ref 0.1–1.0)
Monocytes Relative: 4 %
Neutro Abs: 3.3 10*3/uL (ref 1.7–7.7)
Neutrophils Relative %: 65 %
Platelets: 146 10*3/uL — ABNORMAL LOW (ref 150–400)
RBC: 3.57 MIL/uL — ABNORMAL LOW (ref 3.87–5.11)
RDW: 13.1 % (ref 11.5–15.5)
WBC: 4.9 10*3/uL (ref 4.0–10.5)
nRBC: 0 % (ref 0.0–0.2)

## 2023-05-09 LAB — URINALYSIS, W/ REFLEX TO CULTURE (INFECTION SUSPECTED)
Bacteria, UA: NONE SEEN
Bilirubin Urine: NEGATIVE
Glucose, UA: NEGATIVE mg/dL
Hgb urine dipstick: NEGATIVE
Ketones, ur: NEGATIVE mg/dL
Leukocytes,Ua: NEGATIVE
Nitrite: NEGATIVE
Protein, ur: NEGATIVE mg/dL
Specific Gravity, Urine: 1.01 (ref 1.005–1.030)
pH: 7 (ref 5.0–8.0)

## 2023-05-09 LAB — BASIC METABOLIC PANEL
Anion gap: 10 (ref 5–15)
BUN: 21 mg/dL (ref 8–23)
CO2: 24 mmol/L (ref 22–32)
Calcium: 9.6 mg/dL (ref 8.9–10.3)
Chloride: 105 mmol/L (ref 98–111)
Creatinine, Ser: 1.06 mg/dL — ABNORMAL HIGH (ref 0.44–1.00)
GFR, Estimated: 57 mL/min — ABNORMAL LOW (ref >60)
Glucose, Bld: 109 mg/dL — ABNORMAL HIGH (ref 70–99)
Potassium: 3.7 mmol/L (ref 3.5–5.1)
Sodium: 139 mmol/L (ref 135–145)

## 2023-05-09 NOTE — ED Provider Notes (Signed)
[place] [facility]ERGENC[facility] DEPARTMENT AT [facility] Provider Note   CSN: [id number] Arrival date & time: 05/09/23  1247     History  Chief Complaint  Patient presents with   Weakness    Rachel Black is a 72 y.o. female.  HPI     72 year old female with history of pretension, CAD, on Eliquis, diverticulitis comes in with chief complaint of generalized weakness and dizziness.  Patient states that she has generalized weakness dizziness at baseline, but today her symptoms were worse.  Patient denies any chest pain, shortness of breath, URI-like symptoms, new abdominal pain, nausea, vomiting, diarrhea, UTI-like symptoms.  She states that she has dizziness when she tries to walk.  No history of strokes.  She denies any one-sided weakness, numbness, double vision, difficulty with speech, difficulty in swallowing.  Home Medications Prior to Admission medications   Medication Sig Start Date End Date Taking? Authorizing Provider  ACCU-CHEK AVIVA PLUS test strip USE AS INSTRUCTED ONCE A DAY. DX CODE E11.9 07/20/22   Mayer Masker, PA-C  Accu-Chek Softclix Lancets lancets Use as instructed once a day.  DX Code: E11.9 03/24/22   Mayer Masker, PA-C  acetaminophen (TYLENOL) 500 MG tablet Take 1,000 mg by mouth every 6 (six) hours as needed for mild pain or moderate pain.    [provider]  allopurinol (ZYLOPRIM) 300 MG tablet Take 1 tablet (300 mg total) by mouth 2 (two) times daily. 05/12/23   Melida Quitter, PA  apixaban (ELIQUIS) 5 MG TABS tablet Take 1 tablet (5 mg total) by mouth 2 (two) times daily. 05/12/23   Saralyn Pilar A, PA  CALCIUM PO Take 2 tablets by mouth daily.    [provider]  dicyclomine (BENTYL) 20 MG tablet Take 1 tablet (20 mg total) by mouth 2 (two) times daily as needed for spasms (abdominal cramping). 05/12/23   Melida Quitter, PA  DULoxetine (CYMBALTA) 60 MG capsule Take 1 capsule (60 mg total) by mouth daily. 05/12/23   Saralyn Pilar A, PA  Emollient (UDDERLY SMOOTH) CREA Apply 1 application topically See admin instructions. Apply topically to skin folds as needed for rash/irritation    [provider]  famotidine (PEPCID) 10 MG tablet Take 1 tablet (10 mg total) by mouth 2 (two) times daily for 5 days. And then afterwards take as needed for heartburn 04/26/23 05/01/23  Mesner, Barbara Cower, MD  fenofibrate 160 MG tablet Take 1 tablet (160 mg total) by mouth daily. 05/12/23   Melida Quitter, PA  ferrous sulfate 325 (65 FE) MG tablet TAKE 1 TABLET BY MOUTH TWICE A DAY Patient taking differently: Take 325 mg by mouth every evening. 08/03/22   Zehr, Princella Pellegrini, PA-C  gabapentin (NEURONTIN) 300 MG capsule Take 1 capsule (300 mg total) by mouth at bedtime. 05/12/23   Melida Quitter, PA  isosorbide mononitrate (IMDUR) 30 MG 24 hr tablet Take 30 mg by mouth daily. 11/30/22   [provider]  olmesartan (BENICAR) 5 MG tablet Take 1 tablet (5 mg total) by mouth daily. 05/12/23   Melida Quitter, PA  ondansetron (ZOFRAN-ODT) 4 MG disintegrating tablet Take 1 tablet (4 mg total) by mouth every 8 (eight) hours as needed for nausea or vomiting. 04/03/23   Rondel Baton, MD  pantoprazole (PROTONIX) 40 MG tablet TAKE 1 TABLET BY MOUTH EVERY DAY 04/05/23   Hilarie Fredrickson, MD  polyethylene glycol (MIRALAX / GLYCOLAX) 17 g packet Take 17 g by mouth daily as  needed for moderate constipation.    [provider]  rosuvastatin (CRESTOR) 40 MG tablet TAKE 1 TABLET BY MOUTH EVERY DAY 01/04/23   Rollene Rotunda, MD  sucralfate (CARAFATE) 1 GM/10ML suspension Take 10 mLs (1 g total) by mouth 4 (four) times daily -  with meals and at bedtime. 04/26/23   Mesner, Barbara Cower, MD      Allergies    Other and Penicillins    Review of Systems   Review of Systems  All other systems reviewed and are negative.   Physical Exam Updated Vital Signs BP (!) 125/58   Pulse (!) 50   Temp 98 F (36.7 C) (Oral)   Resp 18   Ht 5\' 6"   (1.676 m)   Wt 81.6 kg   SpO2 100%   BMI 29.05 kg/m  Physical Exam Vitals and nursing note reviewed.  Constitutional:      Appearance: She is well-developed.  HENT:     Head: Atraumatic.  Eyes:     Extraocular Movements: Extraocular movements intact.     Pupils: Pupils are equal, round, and reactive to light.  Cardiovascular:     Rate and Rhythm: Normal rate.  Pulmonary:     Effort: Pulmonary effort is normal.  Musculoskeletal:     Cervical back: Neck supple.  Skin:    General: Skin is warm and dry.  Neurological:     Mental Status: She is alert and oriented to person, place, and time.     Cranial Nerves: No cranial nerve deficit.     Sensory: No sensory deficit.     Motor: No weakness.     Coordination: Coordination normal.     ED Results / Procedures / Treatments   Labs (all labs ordered are listed, but only abnormal results are displayed) Labs Reviewed  BASIC METABOLIC PANEL - Abnormal; Notable for the following components:      Result Value   Glucose, Bld 109 (*)    Creatinine, Ser 1.06 (*)    GFR, Estimated 57 (*)    All other components within normal limits  CBC WITH DIFFERENTIAL/PLATELET - Abnormal; Notable for the following components:   RBC 3.57 (*)    Hemoglobin 11.3 (*)    HCT 34.2 (*)    Platelets 146 (*)    All other components within normal limits  URINALYSIS, W/ REFLEX TO CULTURE (INFECTION SUSPECTED) - Abnormal; Notable for the following components:   Color, Urine STRAW (*)    All other components within normal limits    EKG EKG Interpretation Date/Time:  Sunday May 09 2023 13:52:00 EDT Ventricular Rate:  50 PR Interval:  175 QRS Duration:  118 QT Interval:  403 QTC Calculation: 368 R Axis:   -56  Text Interpretation: Sinus rhythm LAD, consider left anterior fascicular block No acute changes Confirmed by Derwood Kaplan [phone]) on 05/09/2023 2:11:19 PM  Radiology No results found.  Procedures Procedures    Medications Ordered in  ED Medications - No data to display  ED Course/ Medical Decision Making/ A&P                                 Medical Decision Making Amount and/or Complexity of Data Reviewed Labs: ordered.  72 year old female comes in with chief complaint of generalized weakness and abdominal pain and feeling dizzy.  None of her symptoms are new.  Today she just felt worse than usual and called 911.  She  has no chest pain, shortness of breath.  Patient does have a past medical history of PE, CAD and chronic abdominal pain.  She is somewhat of a frequent utilizer of the ER with 40 visits in the last 6 months.  Most of her symptoms are vague and chronic in nature.  She has no focal neurodeficits.  No focal abdominal tenderness.  Patient appears pale, but she has had numerous CBCs which are reassuring.  She has had troponins, EKG that have been fine as well.  I reviewed care everywhere, she was recently seen by GI doctor for chronic abdominal pain and they do not think she needs additional workup.  She is suspect to have functional abdominal pain and also depression.  Her abdominal exam today is reassuring.  Plan is to get basic labs only.  She has had 7 x CT scans in the last 3 months, they have all been negative. anticipate discharge at this time.   Final Clinical Impression(s) / ED Diagnoses Final diagnoses:  Generalized weakness  Chronic abdominal pain  Dizziness    Rx / DC Orders ED Discharge Orders     None         Derwood Kaplan, MD 05/15/23 [phone]

## 2023-05-09 NOTE — ED Notes (Signed)
Pt ambulated to the bathroom.  

## 2023-05-09 NOTE — Discharge Instructions (Addendum)
All the results in the ER are normal, labs and imaging. We are not sure what is causing your symptoms. The workup in the ER is not complete, and is limited to screening for life threatening and emergent conditions only, so please see a primary care doctor for further evaluation.  

## 2023-05-09 NOTE — ED Triage Notes (Addendum)
Patient brought in by EMS from home for weakness and generalized pain. Patient reports that these symptoms started early this morning but she has had these issues intermittently "for quite a while." Patient is AAOx4.

## 2023-05-11 ENCOUNTER — Emergency Department (HOSPITAL_COMMUNITY): Payer: BC Managed Care – PPO

## 2023-05-11 ENCOUNTER — Emergency Department (HOSPITAL_COMMUNITY)
Admission: EM | Admit: 2023-05-11 | Discharge: 2023-05-12 | Disposition: A | Discharge status: Home / Self Care | Payer: BC Managed Care – PPO | Attending: Emergency Medicine | Admitting: Emergency Medicine

## 2023-05-11 ENCOUNTER — Other Ambulatory Visit: Payer: Self-pay

## 2023-05-11 ENCOUNTER — Encounter (HOSPITAL_COMMUNITY): Payer: Self-pay

## 2023-05-11 DIAGNOSIS — I10 Essential (primary) hypertension: Secondary | ICD-10-CM | POA: Diagnosis not present

## 2023-05-11 DIAGNOSIS — Z7901 Long term (current) use of anticoagulants: Secondary | ICD-10-CM | POA: Diagnosis not present

## 2023-05-11 DIAGNOSIS — R42 Dizziness and giddiness: Secondary | ICD-10-CM | POA: Insufficient documentation

## 2023-05-11 DIAGNOSIS — E119 Type 2 diabetes mellitus without complications: Secondary | ICD-10-CM | POA: Diagnosis not present

## 2023-05-11 DIAGNOSIS — Z7984 Long term (current) use of oral hypoglycemic drugs: Secondary | ICD-10-CM | POA: Insufficient documentation

## 2023-05-11 DIAGNOSIS — M791 Myalgia, unspecified site: Secondary | ICD-10-CM | POA: Diagnosis present

## 2023-05-11 DIAGNOSIS — R52 Pain, unspecified: Secondary | ICD-10-CM

## 2023-05-11 LAB — BASIC METABOLIC PANEL
Anion gap: 8 (ref 5–15)
BUN: 15 mg/dL (ref 8–23)
CO2: 22 mmol/L (ref 22–32)
Calcium: 9.6 mg/dL (ref 8.9–10.3)
Chloride: 105 mmol/L (ref 98–111)
Creatinine, Ser: 1.17 mg/dL — ABNORMAL HIGH (ref 0.44–1.00)
GFR, Estimated: 50 mL/min — ABNORMAL LOW (ref >60)
Glucose, Bld: 101 mg/dL — ABNORMAL HIGH (ref 70–99)
Potassium: 3.5 mmol/L (ref 3.5–5.1)
Sodium: 135 mmol/L (ref 135–145)

## 2023-05-11 LAB — URINALYSIS, ROUTINE W REFLEX MICROSCOPIC
Bacteria, UA: NONE SEEN
Bilirubin Urine: NEGATIVE
Glucose, UA: NEGATIVE mg/dL
Hgb urine dipstick: NEGATIVE
Ketones, ur: NEGATIVE mg/dL
Nitrite: NEGATIVE
Protein, ur: NEGATIVE mg/dL
Specific Gravity, Urine: 1.024 (ref 1.005–1.030)
pH: 5 (ref 5.0–8.0)

## 2023-05-11 LAB — CBC
HCT: 34.4 % — ABNORMAL LOW (ref 36.0–46.0)
Hemoglobin: 11.4 g/dL — ABNORMAL LOW (ref 12.0–15.0)
MCH: 31.7 pg (ref 26.0–34.0)
MCHC: 33.1 g/dL (ref 30.0–36.0)
MCV: 95.6 fL (ref 80.0–100.0)
Platelets: 161 10*3/uL (ref 150–400)
RBC: 3.6 MIL/uL — ABNORMAL LOW (ref 3.87–5.11)
RDW: 13.1 % (ref 11.5–15.5)
WBC: 5.7 10*3/uL (ref 4.0–10.5)
nRBC: 0 % (ref 0.0–0.2)

## 2023-05-11 LAB — CBG MONITORING, ED: Glucose-Capillary: 93 mg/dL (ref 70–99)

## 2023-05-11 NOTE — Discharge Instructions (Signed)
Please follow up with your doctor tomorrow morning.

## 2023-05-11 NOTE — ED Provider Notes (Signed)
[place] EMERGENCY DEPARTMENT AT [facility] Provider Note   CSN: [id number] Arrival date & time: 05/11/23  1706     History  Chief complaint-body pain   SEE OMALLEY is a 72 y.o. female.  The history is provided by the patient.  Patient w/extensive history including diabetes, hypertension, hyperlipidemia presents with body pain.  Patient reports she has had body pain for over a year.  Reports she is just not feeling well.  Reports intermittent dizziness since this morning.  No other acute complaints    Past Medical History:  Diagnosis Date   Arthritis    PAIN AND OA BOTH KNEES AND SHOULDERS AND ELBOWS AND WRIST   Blood transfusion without reported diagnosis 2018   after cholecystectomy   Broken foot, right, closed, initial encounter 09/15/2021   no surgery needed, fell at home and went to ED   Diabetes mellitus    ORAL MEDICATION   Diverticulitis    GERD (gastroesophageal reflux disease)    Gout    NO RECENT FLARE UPS   H/O: rheumatic fever    AS A CHILD - NO KNOWN HEART MURMUR OR HEART PROBLEMS   Heart attack (HCC)    Hyperlipidemia    Hypertension    PFO (patent foramen ovale)    Stroke (HCC) 03/2017    Home Medications Prior to Admission medications   Medication Sig Start Date End Date Taking? Authorizing Provider  ACCU-CHEK AVIVA PLUS test strip USE AS INSTRUCTED ONCE A DAY. DX CODE E11.9 07/20/22   Mayer Masker, PA-C  Accu-Chek Softclix Lancets lancets Use as instructed once a day.  DX Code: E11.9 03/24/22   Mayer Masker, PA-C  acetaminophen (TYLENOL) 500 MG tablet Take 1,000 mg by mouth every 6 (six) hours as needed for mild pain or moderate pain.    [provider]  allopurinol (ZYLOPRIM) 300 MG tablet TAKE 1 TABLET BY MOUTH TWICE A DAY 03/04/23   Boscia, Heather E, NP  CALCIUM PO Take 2 tablets by mouth daily.    [provider]  dicyclomine (BENTYL) 20 MG tablet Take 1 tablet (20 mg total) by mouth 2 (two) times daily as  needed for spasms (abdominal cramping). 04/26/23   Mesner, Barbara Cower, MD  ELIQUIS 5 MG TABS tablet TAKE 1 TABLET BY MOUTH TWICE A DAY 04/26/23   Saralyn Pilar A, PA  Emollient (UDDERLY SMOOTH) CREA Apply 1 application topically See admin instructions. Apply topically to skin folds as needed for rash/irritation    [provider]  famotidine (PEPCID) 10 MG tablet Take 1 tablet (10 mg total) by mouth 2 (two) times daily for 5 days. And then afterwards take as needed for heartburn 04/26/23 05/01/23  Mesner, Barbara Cower, MD  fenofibrate 160 MG tablet TAKE 1 TABLET BY MOUTH EVERY DAY 03/04/23   Carlean Jews, NP  ferrous sulfate 325 (65 FE) MG tablet TAKE 1 TABLET BY MOUTH TWICE A DAY Patient taking differently: Take 325 mg by mouth every evening. 08/03/22   Zehr, Princella Pellegrini, PA-C  gabapentin (NEURONTIN) 300 MG capsule Take 1 capsule (300 mg total) by mouth at bedtime. 02/23/23   Gloris Manchester, MD  isosorbide mononitrate (IMDUR) 30 MG 24 hr tablet Take 30 mg by mouth daily. 11/30/22   [provider]  olmesartan (BENICAR) 5 MG tablet TAKE 1 TABLET (5 MG TOTAL) BY MOUTH DAILY. 04/23/23   Saralyn Pilar A, PA  ondansetron (ZOFRAN-ODT) 4 MG disintegrating tablet Take 1 tablet (4 mg total) by mouth every  8 (eight) hours as needed for nausea or vomiting. 04/03/23   Rondel Baton, MD  pantoprazole (PROTONIX) 40 MG tablet TAKE 1 TABLET BY MOUTH EVERY DAY 04/05/23   Hilarie Fredrickson, MD  polyethylene glycol (MIRALAX / GLYCOLAX) 17 g packet Take 17 g by mouth daily as needed for moderate constipation.    [provider]  rosuvastatin (CRESTOR) 40 MG tablet TAKE 1 TABLET BY MOUTH EVERY DAY 01/04/23   Rollene Rotunda, MD  sucralfate (CARAFATE) 1 GM/10ML suspension Take 10 mLs (1 g total) by mouth 4 (four) times daily -  with meals and at bedtime. 04/26/23   Mesner, Barbara Cower, MD      Allergies    Other and Penicillins    Review of Systems   Review of Systems  Physical Exam Updated Vital Signs BP (!)  120/59 (BP Location: Right Arm)   Pulse 80   Temp 98.2 F (36.8 C)   Resp 16   Ht 1.676 m (5\' 6" )   Wt 81.6 kg   SpO2 100%   BMI 29.05 kg/m  Physical Exam CONSTITUTIONAL: Elderly, no acute distress HEAD: Normocephalic/atraumatic EYES: EOMI/PERRL, no nystagmus ENMT: Mucous membranes moist NECK: supple no meningeal signs CV: S1/S2 noted, no murmurs/rubs/gallops noted LUNGS: Lungs are clear to auscultation bilaterally, no apparent distress ABDOMEN: soft, nontender NEURO:Awake/alert, face symmetric, no arm or leg drift is noted Gait normal without ataxia EXTREMITIES: pulses normal, full ROM SKIN: warm, color normal PSYCH: Flat affect  ED Results / Procedures / Treatments   Labs (all labs ordered are listed, but only abnormal results are displayed) Labs Reviewed  BASIC METABOLIC PANEL - Abnormal; Notable for the following components:      Result Value   Glucose, Bld 101 (*)    Creatinine, Ser 1.17 (*)    GFR, Estimated 50 (*)    All other components within normal limits  CBC - Abnormal; Notable for the following components:   RBC 3.60 (*)    Hemoglobin 11.4 (*)    HCT 34.4 (*)    All other components within normal limits  URINALYSIS, ROUTINE W REFLEX MICROSCOPIC - Abnormal; Notable for the following components:   APPearance HAZY (*)    Leukocytes,Ua TRACE (*)    All other components within normal limits  CBG MONITORING, ED    EKG EKG Interpretation Date/Time:  Tuesday May 11 2023 17:50:25 EDT Ventricular Rate:  79 PR Interval:  170 QRS Duration:  98 QT Interval:  422 QTC Calculation: 483 R Axis:   -64  Text Interpretation: Sinus rhythm with Premature supraventricular complexes Left anterior fascicular block Nonspecific T wave abnormality Abnormal ECG Confirmed by Zadie Rhine ([id number]) on 05/11/2023 11:07:23 PM  Radiology CT Head Wo Contrast  Result Date: 05/11/2023 CLINICAL DATA:  Central vertigo EXAM: CT HEAD WITHOUT CONTRAST TECHNIQUE: Contiguous axial  images were obtained from the base of the skull through the vertex without intravenous contrast. RADIATION DOSE REDUCTION: This exam was performed according to the departmental dose-optimization program which includes automated exposure control, adjustment of the mA and/or kV according to patient size and/or use of iterative reconstruction technique. COMPARISON:  03/14/2017 FINDINGS: Brain: Encephalomalacia within the right parietal cortex consistent with sequela of prior infarct. No signs of acute infarct or hemorrhage. Lateral ventricles and midline structures are unremarkable. No acute extra-axial fluid collections. No mass effect. Vascular: No hyperdense vessel or unexpected calcification. Skull: Normal. Negative for fracture or focal lesion. Sinuses/Orbits: No acute finding. Other: None. IMPRESSION: 1. No acute intracranial process.  2. Chronic right parietal cortical infarct. Electronically Signed   By: Sharlet Salina M.D.   On: 05/11/2023 19:02    Procedures Procedures    Medications Ordered in ED Medications - No data to display  ED Course/ Medical Decision Making/ A&P                                 Medical Decision Making Amount and/or Complexity of Data Reviewed Labs: ordered. Radiology: ordered.   Patient presents for her 41st visit in 6 months.  Patient today reports total body pain and dizziness has had for a year.  Patient is awake alert in no acute distress.  Patient can ambulate without difficulty.  No focal weakness. I personally reviewed labs and they are unremarkable.  CT head was reviewed and no acute findings.  Patient is safe for outpatient management.  Long discussion was had with patient and family about need to follow-up with PCP appointment tomorrow         Final Clinical Impression(s) / ED Diagnoses Final diagnoses:  Total body pain    Rx / DC Orders ED Discharge Orders     None         Zadie Rhine, MD 05/11/23 2339

## 2023-05-11 NOTE — ED Triage Notes (Signed)
Pt arrives via POV. Pt reports generalized body aches for about 1 year. States she is just not feeling well. Reports intermittent dizziness, states it worsened this morning.

## 2023-05-12 ENCOUNTER — Ambulatory Visit (INDEPENDENT_AMBULATORY_CARE_PROVIDER_SITE_OTHER): Payer: BC Managed Care – PPO | Admitting: Family Medicine

## 2023-05-12 ENCOUNTER — Encounter: Payer: Self-pay | Admitting: Family Medicine

## 2023-05-12 VITALS — BP 126/84 | HR 76 | Resp 18 | Ht 66.0 in | Wt 181.0 lb

## 2023-05-12 DIAGNOSIS — M1000 Idiopathic gout, unspecified site: Secondary | ICD-10-CM

## 2023-05-12 DIAGNOSIS — F419 Anxiety disorder, unspecified: Secondary | ICD-10-CM

## 2023-05-12 DIAGNOSIS — I152 Hypertension secondary to endocrine disorders: Secondary | ICD-10-CM

## 2023-05-12 DIAGNOSIS — I2510 Atherosclerotic heart disease of native coronary artery without angina pectoris: Secondary | ICD-10-CM

## 2023-05-12 DIAGNOSIS — E1159 Type 2 diabetes mellitus with other circulatory complications: Secondary | ICD-10-CM | POA: Diagnosis not present

## 2023-05-12 DIAGNOSIS — E782 Mixed hyperlipidemia: Secondary | ICD-10-CM

## 2023-05-12 DIAGNOSIS — E1169 Type 2 diabetes mellitus with other specified complication: Secondary | ICD-10-CM | POA: Diagnosis not present

## 2023-05-12 DIAGNOSIS — K581 Irritable bowel syndrome with constipation: Secondary | ICD-10-CM | POA: Diagnosis not present

## 2023-05-12 MED ORDER — OLMESARTAN MEDOXOMIL 5 MG PO TABS
5.0000 mg | ORAL_TABLET | Freq: Every day | ORAL | 3 refills | Status: DC
Start: 2023-05-12 — End: 2024-08-07

## 2023-05-12 MED ORDER — ALLOPURINOL 300 MG PO TABS
300.0000 mg | ORAL_TABLET | Freq: Two times a day (BID) | ORAL | 1 refills | Status: DC
Start: 2023-05-12 — End: 2023-12-01

## 2023-05-12 MED ORDER — DULOXETINE HCL 60 MG PO CPEP
60.0000 mg | ORAL_CAPSULE | Freq: Every day | ORAL | 3 refills | Status: AC
Start: 2023-05-12 — End: ?

## 2023-05-12 MED ORDER — GABAPENTIN 300 MG PO CAPS
300.0000 mg | ORAL_CAPSULE | Freq: Every day | ORAL | 1 refills | Status: AC
Start: 2023-05-12 — End: ?

## 2023-05-12 MED ORDER — APIXABAN 5 MG PO TABS: 5.0000 mg | ORAL_TABLET | Freq: Two times a day (BID) | ORAL | 2 refills | Status: DC

## 2023-05-12 MED ORDER — FENOFIBRATE 160 MG PO TABS: 160.0000 mg | ORAL_TABLET | Freq: Every day | ORAL | 3 refills | Status: AC

## 2023-05-12 MED ORDER — DICYCLOMINE HCL 20 MG PO TABS
20.0000 mg | ORAL_TABLET | Freq: Two times a day (BID) | ORAL | 1 refills | Status: DC | PRN
Start: 2023-05-12 — End: 2023-06-09

## 2023-05-12 NOTE — Progress Notes (Signed)
Established Patient Office Visit  Subjective   Patient ID: Rachel Black, female    DOB: 22-Dec-1950  Age: 72 y.o. MRN: [id number]  Chief Complaint  Patient presents with   Diabetes   Hyperlipidemia   Hypertension    HPI Rachel Black is a 72 y.o. female presenting today for follow up of hypertension, hyperlipidemia, diabetes. Hypertension:  Pt denies chest pain, SOB, dizziness, edema, syncope, fatigue or heart palpitations. Taking olmesartan, reports excellent compliance with treatment. Denies side effects. Hyperlipidemia: tolerating rosuvastatin and fenofibrate well with no myalgias or significant side effects.  The ASCVD Risk score (Arnett DK, et al., 2019) failed to calculate for the following reasons:   The patient has a prior MI or stroke diagnosis Diabetes: denies hypoglycemic events, wounds or sores that are not healing well, increased thirst or urination. Denies vision problems, eye exam due.  Managing with low-carb diet.  ROS Negative unless otherwise noted in HPI   Objective:     BP 126/84 (BP Location: Left Arm, Patient Position: Sitting, Cuff Size: Normal)   Pulse 76   Resp 18   Ht 5\' 6"  (1.676 m)   Wt 181 lb (82.1 kg)   SpO2 97%   BMI 29.21 kg/m   Physical Exam Constitutional:      General: She is not in acute distress.    Appearance: Normal appearance.  HENT:     Head: Normocephalic and atraumatic.  Cardiovascular:     Rate and Rhythm: Normal rate and regular rhythm.     Heart sounds: No murmur heard.    No friction rub. No gallop.  Pulmonary:     Effort: Pulmonary effort is normal. No respiratory distress.     Breath sounds: No wheezing, rhonchi or rales.  Skin:    General: Skin is warm and dry.  Neurological:     Mental Status: She is alert and oriented to person, place, and time.    Diabetic Foot Exam - Simple   Simple Foot Form Diabetic Foot exam was performed with the following findings: Yes 05/12/2023  4:22 PM  Visual Inspection No  deformities, no ulcerations, no other skin breakdown bilaterally: Yes Sensation Testing Intact to touch and monofilament testing bilaterally: Yes Pulse Check Posterior Tibialis and Dorsalis pulse intact bilaterally: Yes Comments    Results for orders placed or performed during the hospital encounter of 05/11/23  Basic metabolic panel  Result Value Ref Range   Sodium 135 135 - 145 mmol/L   Potassium 3.5 3.5 - 5.1 mmol/L   Chloride 105 98 - 111 mmol/L   CO2 22 22 - 32 mmol/L   Glucose, Bld 101 (H) 70 - 99 mg/dL   BUN 15 8 - 23 mg/dL   Creatinine, Ser 3.55 (H) 0.44 - 1.00 mg/dL   Calcium 9.6 8.9 - 73.2 mg/dL   GFR, Estimated 50 (L) >60 mL/min   Anion gap 8 5 - 15  CBC  Result Value Ref Range   WBC 5.7 4.0 - 10.5 K/uL   RBC 3.60 (L) 3.87 - 5.11 MIL/uL   Hemoglobin 11.4 (L) 12.0 - 15.0 g/dL   HCT 20.2 (L) 54.2 - 70.6 %   MCV 95.6 80.0 - 100.0 fL   MCH 31.7 26.0 - 34.0 pg   MCHC 33.1 30.0 - 36.0 g/dL   RDW 23.7 62.8 - 31.5 %   Platelets 161 150 - 400 K/uL   nRBC 0.0 0.0 - 0.2 %  Urinalysis, Routine w reflex microscopic -Urine, Clean Catch  Result Value Ref Range   Color, Urine YELLOW YELLOW   APPearance HAZY (A) CLEAR   Specific Gravity, Urine 1.024 1.005 - 1.030   pH 5.0 5.0 - 8.0   Glucose, UA NEGATIVE NEGATIVE mg/dL   Hgb urine dipstick NEGATIVE NEGATIVE   Bilirubin Urine NEGATIVE NEGATIVE   Ketones, ur NEGATIVE NEGATIVE mg/dL   Protein, ur NEGATIVE NEGATIVE mg/dL   Nitrite NEGATIVE NEGATIVE   Leukocytes,Ua TRACE (A) NEGATIVE   RBC / HPF 0-5 0 - 5 RBC/hpf   WBC, UA 0-5 0 - 5 WBC/hpf   Bacteria, UA NONE SEEN NONE SEEN   Squamous Epithelial / HPF 0-5 0 - 5 /HPF   Mucus PRESENT   CBG monitoring, ED  Result Value Ref Range   Glucose-Capillary 93 70 - 99 mg/dL     Assessment & Plan:  Hypertension associated with diabetes (HCC) Assessment & Plan: BP goal <130/80.  Stable, at goal.  Continue olmesartan 5 mg daily.  Will continue to monitor.  Orders: -      Olmesartan Medoxomil; Take 1 tablet (5 mg total) by mouth daily.  Dispense: 90 tablet; Refill: 3  Mixed hyperlipidemia due to type 2 diabetes mellitus (HCC) Assessment & Plan: Last lipid panel: LDL 60, HDL 41, triglycerides 84.  Continue rosuvastatin 40 mg daily, fenofibrate 160 mg daily.  Orders: -     Fenofibrate; Take 1 tablet (160 mg total) by mouth daily.  Dispense: 90 tablet; Refill: 3  Type 2 diabetes mellitus with other specified complication, without long-term current use of insulin (HCC) Assessment & Plan: A1c 5.4.  Continue with nutrition and physical activity control.  Will continue to monitor.   Irritable bowel syndrome with constipation Assessment & Plan: Repeat emergency room visit for significant abdominal pain.  No cause revealed with laboratory and imaging workup.  She is agreeable to a trial of Cymbalta for IBS symptoms.  Starting Cymbalta 60 mg daily.  If this fails, consider amitriptyline or referral.  Orders: -     DULoxetine HCl; Take 1 capsule (60 mg total) by mouth daily.  Dispense: 90 capsule; Refill: 3 -     Dicyclomine HCl; Take 1 tablet (20 mg total) by mouth 2 (two) times daily as needed for spasms (abdominal cramping).  Dispense: 20 tablet; Refill: 1 -     Gabapentin; Take 1 capsule (300 mg total) by mouth at bedtime.  Dispense: 30 capsule; Refill: 1  Anxiety Assessment & Plan: Cymbalta likely to be helpful with anxiety symptoms as well.  Orders: -     DULoxetine HCl; Take 1 capsule (60 mg total) by mouth daily.  Dispense: 90 capsule; Refill: 3 -     Gabapentin; Take 1 capsule (300 mg total) by mouth at bedtime.  Dispense: 30 capsule; Refill: 1  Coronary artery disease involving native coronary artery of native heart without angina pectoris Assessment & Plan: Provided refill of Eliquis.  Recommended that she schedule follow-up with her cardiologist as soon as possible.  Orders: -     Apixaban; Take 1 tablet (5 mg total) by mouth 2 (two) times daily.   Dispense: 60 tablet; Refill: 2  Idiopathic gout, unspecified chronicity, unspecified site Assessment & Plan: Provided refill of allopurinol for prophylaxis today.  Will continue to monitor.  Orders: -     Allopurinol; Take 1 tablet (300 mg total) by mouth 2 (two) times daily.  Dispense: 180 tablet; Refill: 1    Return in about 3 months (around 08/12/2023) for follow-up for HTN, HLD,  DM, start Cymbalta, fasting blood work 1 week before.    Melida Quitter, PA

## 2023-05-12 NOTE — Assessment & Plan Note (Addendum)
BP goal <130/80.  Stable, at goal.  Continue olmesartan 5 mg daily.  Will continue to monitor.

## 2023-05-12 NOTE — Assessment & Plan Note (Signed)
Provided refill of Eliquis.  Recommended that she schedule follow-up with her cardiologist as soon as possible.

## 2023-05-12 NOTE — Assessment & Plan Note (Addendum)
Cymbalta likely to be helpful with anxiety symptoms as well.

## 2023-05-12 NOTE — Assessment & Plan Note (Signed)
Provided refill of allopurinol for prophylaxis today.  Will continue to monitor.

## 2023-05-12 NOTE — Assessment & Plan Note (Signed)
Last lipid panel: LDL 60, HDL 41, triglycerides 84.  Continue rosuvastatin 40 mg daily, fenofibrate 160 mg daily.

## 2023-05-12 NOTE — Assessment & Plan Note (Signed)
A1c 5.4.  Continue with nutrition and physical activity control.  Will continue to monitor.

## 2023-05-12 NOTE — Assessment & Plan Note (Signed)
Repeat emergency room visit for significant abdominal pain.  No cause revealed with laboratory and imaging workup.  She is agreeable to a trial of Cymbalta for IBS symptoms.  Starting Cymbalta 60 mg daily.  If this fails, consider amitriptyline or referral.

## 2023-05-27 ENCOUNTER — Telehealth: Payer: Self-pay | Admitting: Internal Medicine

## 2023-05-27 ENCOUNTER — Telehealth: Payer: Self-pay

## 2023-05-27 NOTE — Telephone Encounter (Signed)
Left message to call back  

## 2023-05-27 NOTE — Telephone Encounter (Signed)
Rachel Black with [employer] is calling to discuss PT's plan of care. Requesting call back [phone] ext [phone]. She will be in her office M-F 7-430pm [place].

## 2023-05-27 NOTE — Telephone Encounter (Signed)
Case manager from [facility] called and wanted to know what plan of care is for pt as she has been in and out of the ER multiple times. DIscussed Dr. Lamar Sprinkles assessment from last OV note.

## 2023-05-27 NOTE — Telephone Encounter (Signed)
Rachel Black is requesting a call back from the clinical staff. She didn't go into details about the reason for the call.  [phone] EXT [phone]

## 2023-05-28 NOTE — Telephone Encounter (Signed)
Helmut Muster from [facility] called to inquire about patient's frequent ED visits in the past 2 months and would like to know what the plan of care is. Helmut Muster states she spoke with GI and it was their recommendation that patient be evaluated by mental health.   Please call Helmut Muster at [phone] ext. [phone]

## 2023-05-28 NOTE — Telephone Encounter (Signed)
Not sure if she can see claims for her most recent office visit and prescription, but we started a prescription of duloxetine 60 mg to address mental health concerns that are contributing to chronic abdominal pain and frequent emergency room visits.  Patient declined referral to mental health at the time but was agreeable to trial of duloxetine with PCP.  We can fax her a copy of my most recent office note from 05/12/2023 if we have that number.  Of note, patient has not had an ED visit since her appointment on 05/12/2023 (16 days total) whereas during the months of July and August the longest period of time that she went between an emergency room visits was 14 days.

## 2023-06-01 ENCOUNTER — Other Ambulatory Visit: Payer: Self-pay | Admitting: Cardiology

## 2023-06-03 ENCOUNTER — Other Ambulatory Visit: Payer: Self-pay | Admitting: Cardiology

## 2023-06-03 DIAGNOSIS — E1169 Type 2 diabetes mellitus with other specified complication: Secondary | ICD-10-CM

## 2023-06-09 ENCOUNTER — Other Ambulatory Visit: Payer: Self-pay | Admitting: Family Medicine

## 2023-06-09 DIAGNOSIS — K581 Irritable bowel syndrome with constipation: Secondary | ICD-10-CM

## 2023-06-14 ENCOUNTER — Other Ambulatory Visit: Payer: Self-pay | Admitting: Family Medicine

## 2023-06-14 DIAGNOSIS — K581 Irritable bowel syndrome with constipation: Secondary | ICD-10-CM

## 2023-06-14 DIAGNOSIS — F419 Anxiety disorder, unspecified: Secondary | ICD-10-CM

## 2023-07-04 ENCOUNTER — Other Ambulatory Visit: Payer: Self-pay | Admitting: Family Medicine

## 2023-07-04 DIAGNOSIS — K581 Irritable bowel syndrome with constipation: Secondary | ICD-10-CM

## 2023-07-07 ENCOUNTER — Other Ambulatory Visit: Payer: Self-pay | Admitting: Cardiology

## 2023-07-11 NOTE — Progress Notes (Deleted)
Cardiology Office Note:   Date:  07/11/2023  ID:  Rachel Black, DOB March 14, 1951, MRN [id number] PCP: Melida Quitter, PA   HeartCare Providers Cardiologist:  Rollene Rotunda, MD Cardiology APP:  Raford Pitcher Joline Salt, PA-C {  History of Present Illness:   Rachel Black is a 72 y.o. female  who presents for follow-up of coronary artery disease.  She was admitted in February 2019.  She had a non-STEMI.  She had an occluded second marginal.  She was managed medically.  She has had a cryptogenic stroke.  She has an implanted loop recorder.  She did have a PFO on TEE.      Since I last saw her in June she has been in the ED 8 times.  She had no sustained arrhythmias.  Symptoms did not occur necessarily with her PVCs or occasional junctional beats.  She does not describe chest discomfort and I did go back and review to emergency room consult by our team.  What she is really describing is palpitations.  However, there were no significant dysrhythmias on the monitor despite having palpitations.  She was on beta-blockers after 1 consultation in late June but taken off of this at the beginning of July because of some bradycardia arrhythmias on the monitor and telemetry.  She has not noticed any difference.  She is not currently having new symptoms.  She will feel it skipping occasionally.  However, with me she does not describe chest pressure, neck or arm discomfort.  She has not had any presyncope or syncope.  She had no new shortness of breath, PND or orthopnea.  She gets around slowly with her house because of knee pain  ROS: ***  Studies Reviewed:    EKG:       ***  Risk Assessment/Calculations:   {Does this patient have ATRIAL FIBRILLATION?:[phone]} No BP recorded.  {Refresh Note OR Click here to enter BP  :1}***        Physical Exam:   VS:  There were no vitals taken for this visit.   Wt Readings from Last 3 Encounters:  05/12/23 181 lb (82.1 kg)  05/11/23 180 lb (81.6 kg)   05/09/23 180 lb (81.6 kg)     GEN: Well nourished, well developed in no acute distress NECK: No JVD; No carotid bruits CARDIAC: ***RRR, no murmurs, rubs, gallops RESPIRATORY:  Clear to auscultation without rales, wheezing or rhonchi  ABDOMEN: Soft, non-tender, non-distended EXTREMITIES:  No edema; No deformity   ASSESSMENT AND PLAN:   CAD:  ***   The patient had single branch vessel disease.  She is not really describing chest discomfort.  No further testing is suggested at this time and she can continue with the nitrates.  If chest discomfort becomes a predominant complaint we could consider repeat catheterization to look for microvascular disease.    HTN:    The blood pressure is ***  at target.  No change in therapy.    PALPITATIONS:   ***  I think that she does have occasional skipped beats.  It is not made better by beta-blockers.  I do not think there is a need for further antiarrhythmics.  There were no sustained arrhythmias and symptoms did not necessarily correlate.  No further testing.    PFO:   ***  This was an incidental finding.  No change in therapy.      {Are you ordering a CV Procedure (e.g. stress test, cath, DCCV, TEE, etc)?   Press  F2        :[id number]}  Follow up ***  Signed, Rollene Rotunda, MD

## 2023-07-13 NOTE — Progress Notes (Unsigned)
Cardiology Office Note:   Date:  07/14/2023  ID:  Rachel Black, Rachel Black Mar 06, 1951, MRN [id number] PCP: Melida Quitter, PA  [facility] Providers Cardiologist:  Rollene Rotunda, MD Cardiology APP:  Raford Pitcher Joline Salt, PA-C {  History of Present Illness:   Rachel Black is a 72 y.o. female  who presents for follow-up of coronary artery disease.  She was admitted in February 2019.  She had a non-STEMI.  She had an occluded second marginal.  She was managed medically.  She has had a cryptogenic stroke.  She has an implanted loop recorder.  She did have a PFO on TEE.      She has been in the ED about 40 times this year.   Most of these look like they have been related to her weakness and GI complaints.  I see that she was in the hospital in May 2024.  She had some mild colitis but no other clear etiology of her abdominal complaints and diarrhea.  I do see that she is on Eliquis now and investigation demonstrates that she had pulmonary emboli.  This was a small right lower lobe PE.  It was suggested that she needed Eliquis for 6 months.  She does not recall this.    She is not having any new cardiovascular complaints.  She denies chest pressure, neck or arm discomfort.  She has had no new shortness of breath, PND or orthopnea.  She gets around very slowly because of leg weakness.  ROS: As stated in the HPI and negative for all other systems.  Studies Reviewed:    EKG:   EKG Interpretation Date/Time:  Wednesday July 14 2023 15:53:45 EDT Ventricular Rate:  92 PR Interval:  160 QRS Duration:  96 QT Interval:  342 QTC Calculation: 422 R Axis:   -70  Text Interpretation: Sinus rhythm with Premature supraventricular complexes Left anterior fascicular block Poor anterior R wave progression When compared with ECG of 11-May-2023 17:50, QT has shortened Confirmed by Rollene Rotunda ([id number]) on 07/14/2023 4:28:02 PM     Risk Assessment/Calculations:              Physical Exam:    VS:  BP 98/68   Pulse 92   Ht 5\' 6"  (1.676 m)   Wt 184 lb 3.2 oz (83.6 kg)   SpO2 98%   BMI 29.73 kg/m    Wt Readings from Last 3 Encounters:  07/14/23 184 lb 3.2 oz (83.6 kg)  05/12/23 181 lb (82.1 kg)  05/11/23 180 lb (81.6 kg)     GEN:   Frail appearing frail appearing but in no distress  NECK: No JVD; No carotid bruits CARDIAC: RRR, no murmurs, rubs, gallops RESPIRATORY:  Clear to auscultation without rales, wheezing or rhonchi  ABDOMEN: Soft, non-tender, non-distended EXTREMITIES:  No edema; No deformity   ASSESSMENT AND PLAN:   CAD:    She had single-vessel branch vessel disease.  She is not having any new symptoms.  No change in therapy.  HTN:    The blood pressure is low today but this is unusual.  No change in therapy.   PALPITATIONS: She has had some SVT.  However, she had a remote monitor sometime ago after cryptogenic stroke and there was no suggestion of atrial fibrillation.  No change in therapy.  She should be back on aspirin when she is off the Eliquis.   PFO: This was an incidental finding.  No change in therapy.  PE: I do  see that she had this when she was in the hospital.  I think she should stop her Eliquis at 6 months.  To make sure that that happens we will bring her back to the office.  She can be switched back to aspirin.         Follow up in December as mentioned above.  I think after that she could follow-up yearly  Signed, Rollene Rotunda, MD

## 2023-07-14 ENCOUNTER — Ambulatory Visit: Payer: BC Managed Care – PPO | Admitting: Cardiology

## 2023-07-14 ENCOUNTER — Encounter: Payer: Self-pay | Admitting: Cardiology

## 2023-07-14 ENCOUNTER — Ambulatory Visit: Payer: BC Managed Care – PPO | Attending: Cardiology | Admitting: Cardiology

## 2023-07-14 VITALS — BP 98/68 | HR 92 | Ht 66.0 in | Wt 184.2 lb

## 2023-07-14 DIAGNOSIS — I2510 Atherosclerotic heart disease of native coronary artery without angina pectoris: Secondary | ICD-10-CM

## 2023-07-14 DIAGNOSIS — R002 Palpitations: Secondary | ICD-10-CM | POA: Diagnosis not present

## 2023-07-14 DIAGNOSIS — I10 Essential (primary) hypertension: Secondary | ICD-10-CM

## 2023-07-14 DIAGNOSIS — Q2112 Patent foramen ovale: Secondary | ICD-10-CM | POA: Diagnosis not present

## 2023-07-14 NOTE — Patient Instructions (Addendum)
Medication Instructions:  Continue same medications *If you need a refill on your cardiac medications before your next appointment, please call your pharmacy*   Lab Work: None ordered   Testing/Procedures: None ordered   Follow-Up: At [facility], you and your health needs are our priority.  As part of our continuing mission to provide you with exceptional heart care, we have created designated Provider Care Teams.  These Care Teams include your primary Cardiologist (physician) and Advanced Practice Providers (APPs -  Physician Assistants and Nurse Practitioners) who all work together to provide you with the care you need, when you need it.  We recommend signing up for the patient portal called "MyChart".  Sign up information is provided on this After Visit Summary.  MyChart is used to connect with patients for Virtual Visits (Telemedicine).  Patients are able to view lab/test results, encounter notes, upcoming appointments, etc.  Non-urgent messages can be sent to your provider as well.   To learn more about what you can do with MyChart, go to [email or web address].    Your next appointment:  Tues 12/3 at 1:55 pm    Provider:  Bernadene Person NP

## 2023-07-27 ENCOUNTER — Other Ambulatory Visit: Payer: Self-pay | Admitting: Cardiology

## 2023-07-27 DIAGNOSIS — E1169 Type 2 diabetes mellitus with other specified complication: Secondary | ICD-10-CM

## 2023-08-15 ENCOUNTER — Other Ambulatory Visit: Payer: Self-pay | Admitting: Gastroenterology

## 2023-08-15 ENCOUNTER — Other Ambulatory Visit: Payer: Self-pay | Admitting: Family Medicine

## 2023-08-15 DIAGNOSIS — K317 Polyp of stomach and duodenum: Secondary | ICD-10-CM

## 2023-08-15 DIAGNOSIS — K581 Irritable bowel syndrome with constipation: Secondary | ICD-10-CM

## 2023-08-15 DIAGNOSIS — I2510 Atherosclerotic heart disease of native coronary artery without angina pectoris: Secondary | ICD-10-CM

## 2023-08-15 DIAGNOSIS — F419 Anxiety disorder, unspecified: Secondary | ICD-10-CM

## 2023-08-17 ENCOUNTER — Encounter: Payer: Self-pay | Admitting: Nurse Practitioner

## 2023-08-17 ENCOUNTER — Ambulatory Visit: Payer: BC Managed Care – PPO | Attending: Nurse Practitioner | Admitting: Nurse Practitioner

## 2023-08-17 VITALS — BP 110/68 | HR 72 | Ht 66.0 in | Wt 189.0 lb

## 2023-08-17 DIAGNOSIS — Z8673 Personal history of transient ischemic attack (TIA), and cerebral infarction without residual deficits: Secondary | ICD-10-CM

## 2023-08-17 DIAGNOSIS — Z86711 Personal history of pulmonary embolism: Secondary | ICD-10-CM

## 2023-08-17 DIAGNOSIS — I2510 Atherosclerotic heart disease of native coronary artery without angina pectoris: Secondary | ICD-10-CM

## 2023-08-17 DIAGNOSIS — I471 Supraventricular tachycardia, unspecified: Secondary | ICD-10-CM | POA: Diagnosis not present

## 2023-08-17 DIAGNOSIS — I10 Essential (primary) hypertension: Secondary | ICD-10-CM

## 2023-08-17 DIAGNOSIS — R002 Palpitations: Secondary | ICD-10-CM

## 2023-08-17 DIAGNOSIS — E785 Hyperlipidemia, unspecified: Secondary | ICD-10-CM

## 2023-08-17 NOTE — Progress Notes (Signed)
Office Visit    Patient Name: Rachel Black Date of Encounter: 08/17/2023  Primary Care Provider:  Melida Quitter, PA Primary Cardiologist:  Rollene Rotunda, MD  Chief Complaint    72 year old female with a history of CAD s/p NSTEMI in 2019 with occluded OM2, managed medically, palpitations, PSVT, cryptogenic stroke s/p ILR, PFO, PE, hypertension, hyperlipidemia, and GERD who presents for follow-up related to CAD and PE.   Past Medical History    Past Medical History:  Diagnosis Date   Arthritis    PAIN AND OA BOTH KNEES AND SHOULDERS AND ELBOWS AND WRIST   Blood transfusion without reported diagnosis 2018   after cholecystectomy   Broken foot, right, closed, initial encounter 09/15/2021   no surgery needed, fell at home and went to ED   Diabetes mellitus    ORAL MEDICATION   Diverticulitis    GERD (gastroesophageal reflux disease)    Gout    NO RECENT FLARE UPS   H/O: rheumatic fever    AS A CHILD - NO KNOWN HEART MURMUR OR HEART PROBLEMS   Heart attack (HCC)    Hyperlipidemia    Hypertension    PFO (patent foramen ovale)    Stroke (HCC) 03/2017   Past Surgical History:  Procedure Laterality Date   CESAREAN SECTION     CHOLECYSTECTOMY N/A 12/09/2016   Procedure: LAPAROSCOPIC CHOLECYSTECTOMY WITH INTRAOPERATIVE CHOLANGIOGRAM;  Surgeon: Almond Lint, MD;  Location: [facility];  Service: General;  Laterality: N/A;   COLONOSCOPY     COLONOSCOPY WITH PROPOFOL N/A 01/30/2013   Procedure: COLONOSCOPY WITH PROPOFOL;  Surgeon: Hilarie Fredrickson, MD;  Location: [facility];  Service: Endoscopy;  Laterality: N/A;   ERCP N/A 12/08/2016   Procedure: ENDOSCOPIC RETROGRADE CHOLANGIOPANCREATOGRAPHY (ERCP);  Surgeon: Meryl Dare, MD;  Location: [facility];  Service: Endoscopy;  Laterality: N/A;   ESOPHAGOGASTRODUODENOSCOPY (EGD) WITH PROPOFOL N/A 01/30/2013   Procedure: ESOPHAGOGASTRODUODENOSCOPY (EGD) WITH PROPOFOL;  Surgeon: Hilarie Fredrickson, MD;  Location: [facility];  Service:  Endoscopy;  Laterality: N/A;   LEFT HEART CATH AND CORONARY ANGIOGRAPHY N/A 10/18/2017   Procedure: LEFT HEART CATH AND CORONARY ANGIOGRAPHY;  Surgeon: Swaziland, Peter M, MD;  Location: [facility] INVASIVE CV LAB;  Service: Cardiovascular;  Laterality: N/A;   LOOP RECORDER INSERTION N/A 03/18/2017   Procedure: Loop Recorder Insertion;  Surgeon: Regan Lemming, MD;  Location: [facility] INVASIVE CV LAB;  Service: Cardiovascular;  Laterality: N/A;   PARTIAL HYSTERECTOMY     TEE WITHOUT CARDIOVERSION N/A 03/16/2017   Procedure: TRANSESOPHAGEAL ECHOCARDIOGRAM (TEE);  Surgeon: Elease Hashimoto Deloris Ping, MD;  Location: [facility] ENDOSCOPY;  Service: Cardiovascular;  Laterality: N/A;   TOTAL ABDOMINAL HYSTERECTOMY     TOTAL KNEE ARTHROPLASTY Right 04/20/2014   Procedure: RIGHT TOTAL KNEE ARTHROPLASTY CONVERTED TO RIGHT KNEE REIMPLANTATION;  Surgeon: Kathryne Hitch, MD;  Location: [facility];  Service: Orthopedics;  Laterality: Right;   TOTAL KNEE ARTHROPLASTY Left 08/23/2015   Procedure: LEFT TOTAL KNEE ARTHROPLASTY;  Surgeon: Kathryne Hitch, MD;  Location: [facility];  Service: Orthopedics;  Laterality: Left;   VENTRAL HERNIA REPAIR     x2    Allergies  Allergies  Allergen Reactions   Other Other (See Comments)    Some BANDAIDS cause skin irritation    Penicillins Itching     Tolerated Zosyn couse 10/04/22     Labs/Other Studies Reviewed    The following studies were reviewed today:  Cardiac Studies & Procedures   CARDIAC CATHETERIZATION  CARDIAC CATHETERIZATION 10/18/2017  Narrative  Lat 2nd Mrg lesion is 100% stenosed.  LV end diastolic pressure is normal.  1. Single vessel occlusive CAD with 100% occlusion of side branch of the second OM 2. Otherwise normal coronary anatomy 3. Normal LVEDP  Plan: medical therapy.  Findings Coronary Findings Diagnostic  Dominance: Right  Left Main Vessel was injected. Vessel is normal in caliber. Vessel is angiographically normal.  Left Anterior  Descending Vessel was injected. Vessel is normal in caliber. Vessel is angiographically normal.  Left Circumflex  Lateral Second Obtuse Marginal Branch Lat 2nd Mrg lesion is 100% stenosed.  Right Coronary Artery Vessel was injected. Vessel is normal in caliber. Vessel is angiographically normal.  Intervention  No interventions have been documented.     ECHOCARDIOGRAM  ECHOCARDIOGRAM COMPLETE 10/17/2017  Narrative *[place]* *[facility]* [address] [place], [place] [address] [phone]  ------------------------------------------------------------------- Transthoracic Echocardiography  Patient:    Rachel Black, Rachel Black MR #:       [id number] Study Date: 10/17/2017 Gender:     F Age:        66 Height:     167.6 cm Weight:     122.4 kg BSA:        2.45 m^2 Pt. Status: Room:       6E08C  ADMITTING    Rollene Rotunda, MD ATTENDING    Doug Sou [id number] SONOGRAPHER  Delcie Roch, RDCS, CCT ORDERING     Prentice Docker, MD PERFORMING   [facility], Inpatient  cc:  ------------------------------------------------------------------- LV EF: 55% -   60%  ------------------------------------------------------------------- History:   PMH:  Elevated troponin.  Angina pectoris.  Risk factors:  Hypertension. Diabetes mellitus. Dyslipidemia.  ------------------------------------------------------------------- Study Conclusions  - Left ventricle: The cavity size was normal. There was mild concentric hypertrophy. Systolic function was normal. The estimated ejection fraction was in the range of 55% to 60%. Wall motion was normal; there were no regional wall motion abnormalities. Doppler parameters are consistent with abnormal left ventricular relaxation (grade 1 diastolic dysfunction). - Aortic valve: Transvalvular velocity was within the normal range. There was no stenosis. There was no regurgitation. - Mitral valve: Transvalvular velocity was within  the normal range. There was no evidence for stenosis. There was no regurgitation. - Right ventricle: The cavity size was normal. Wall thickness was normal. Systolic function was normal. - Atrial septum: No defect or patent foramen ovale was identified by color flow Doppler. - Tricuspid valve: There was mild regurgitation. - Pulmonary arteries: Systolic pressure was within the normal range. PA peak pressure: 21 mm Hg (S).  ------------------------------------------------------------------- Study data:  Comparison was made to the study of 03/15/2017.  Study status:  Routine.  Procedure:  The patient reported no pain pre or post test. Transthoracic echocardiography. Image quality was good. Study completion:  There were no complications. Transthoracic echocardiography.  M-mode, complete 2D, spectral Doppler, and color Doppler.  Birthdate:  Patient birthdate: Jan 07, 1951.  Age:  Patient is 72 yr old.  Sex:  Gender: female. BMI: 43.6 kg/m^2.  Blood pressure:     115/61  Patient status: Inpatient.  Study date:  Study date: 10/17/2017. Study time: 03:17 PM.  Location:  Bedside.  -------------------------------------------------------------------  ------------------------------------------------------------------- Left ventricle:  The cavity size was normal. There was mild concentric hypertrophy. Systolic function was normal. The estimated ejection fraction was in the range of 55% to 60%. Wall motion was normal; there were no regional wall motion abnormalities. Doppler parameters are consistent with abnormal left ventricular relaxation (grade 1 diastolic dysfunction). There  was no evidence of elevated ventricular filling pressure by Doppler parameters.  ------------------------------------------------------------------- Aortic valve:   Trileaflet; normal thickness leaflets. Mobility was not restricted.  Doppler:  Transvalvular velocity was within the normal range. There was no stenosis.  There was no regurgitation.  ------------------------------------------------------------------- Aorta:  Aortic root: The aortic root was normal in size.  ------------------------------------------------------------------- Mitral valve:   Structurally normal valve.   Mobility was not restricted.  Doppler:  Transvalvular velocity was within the normal range. There was no evidence for stenosis. There was no regurgitation.  ------------------------------------------------------------------- Left atrium:  The atrium was normal in size.  ------------------------------------------------------------------- Atrial septum:  No defect or patent foramen ovale was identified by color flow Doppler.  ------------------------------------------------------------------- Right ventricle:  The cavity size was normal. Wall thickness was normal. Systolic function was normal.  ------------------------------------------------------------------- Pulmonic valve:    Structurally normal valve.   Cusp separation was normal.  Doppler:  Transvalvular velocity was within the normal range. There was no evidence for stenosis. There was mild regurgitation.  ------------------------------------------------------------------- Tricuspid valve:   Structurally normal valve.    Doppler: Transvalvular velocity was within the normal range. There was mild regurgitation.  ------------------------------------------------------------------- Pulmonary artery:   The main pulmonary artery was normal-sized. Systolic pressure was within the normal range.  ------------------------------------------------------------------- Right atrium:  The atrium was normal in size.  ------------------------------------------------------------------- Pericardium:  There was no pericardial effusion.  ------------------------------------------------------------------- Systemic veins: Inferior vena cava: The vessel was normal in  size.  ------------------------------------------------------------------- Measurements  Left ventricle                         Value        Reference LV ID, ED, PLAX chordal                48    mm     43 - 52 LV ID, ES, PLAX chordal                34    mm     23 - 38 LV fx shortening, PLAX chordal         29    %      >=29 LV PW thickness, ED                    12    mm     ---------- IVS/LV PW ratio, ED                    0.83         <=1.3 Stroke volume, 2D                      88    ml     ---------- Stroke volume/bsa, 2D                  36    ml/m^2 ---------- LV e&', lateral                         8.05  cm/s   ---------- LV E/e&', lateral                       5.73         ---------- LV e&', medial  8.27  cm/s   ---------- LV E/e&', medial                        5.57         ---------- LV e&', average                         8.16  cm/s   ---------- LV E/e&', average                       5.65         ----------  Ventricular septum                     Value        Reference IVS thickness, ED                      10    mm     ----------  LVOT                                   Value        Reference LVOT ID, S                             23    mm     ---------- LVOT area                              4.15  cm^2   ---------- LVOT peak velocity, S                  112   cm/s   ---------- LVOT mean velocity, S                  70.6  cm/s   ---------- LVOT VTI, S                            21.3  cm     ---------- LVOT peak gradient, S                  5     mm Hg  ----------  Aorta                                  Value        Reference Aortic root ID, ED                     37    mm     ----------  Left atrium                            Value        Reference LA ID, A-P, ES                         45    mm     ---------- LA ID/bsa, A-P  1.84  cm/m^2 <=2.2 LA volume, S                           53.8  ml     ---------- LA  volume/bsa, S                       21.9  ml/m^2 ---------- LA volume, ES, 1-p A4C                 50    ml     ---------- LA volume/bsa, ES, 1-p A4C             20.4  ml/m^2 ---------- LA volume, ES, 1-p A2C                 56.3  ml     ---------- LA volume/bsa, ES, 1-p A2C             23    ml/m^2 ----------  Mitral valve                           Value        Reference Mitral E-wave peak velocity            46.1  cm/s   ---------- Mitral A-wave peak velocity            87.3  cm/s   ---------- Mitral deceleration time               225   ms     150 - 230 Mitral E/A ratio, peak                 0.5          ----------  Pulmonary arteries                     Value        Reference PA pressure, S, DP                     21    mm Hg  <=30  Tricuspid valve                        Value        Reference Tricuspid regurg peak velocity         214   cm/s   ---------- Tricuspid peak RV-RA gradient          18    mm Hg  ----------  Right atrium                           Value        Reference RA ID, S-I, ES, A4C            (H)     50.5  mm     34 - 49 RA area, ES, A4C                       14.6  cm^2   8.3 - 19.5 RA volume, ES, A/L                     35.3  ml     ---------- RA volume/bsa, ES, A/L  14.4  ml/m^2 ----------  Systemic veins                         Value        Reference Estimated CVP                          3     mm Hg  ----------  Right ventricle                        Value        Reference TAPSE                                  16.1  mm     ---------- RV pressure, S, DP                     21    mm Hg  <=30 RV s&', lateral, S                      13.5  cm/s   ----------  Legend: (L)  and  (H)  mark values outside specified reference range.  ------------------------------------------------------------------- Prepared and Electronically Authenticated by  Chilton Si, MD 2019-02-03T16:31:49   TEE  ECHO TEE 03/16/2017  Narrative *[facility]* *[facility]* [address] [place], [place] [address] [phone]  ------------------------------------------------------------------- Transesophageal Echocardiography  Patient:    Rachel Black, Rachel Black MR #:       [id number] Study Date: 03/16/2017 Gender:     F Age:        89 Height: Weight: BSA: Pt. Status: Room:       5M04C  Doreatha Massed SONOGRAPHER  Lavenia Atlas, RCS ATTENDING    David Stall, Darin Engels [id number] Rosaura Carpenter, MD REFERRING    Marvel Plan, MD  cc:  -------------------------------------------------------------------  ------------------------------------------------------------------- Indications:      CVA 436.  ------------------------------------------------------------------- History:   Risk factors:  Hypertension. Diabetes mellitus. Dyslipidemia.  ------------------------------------------------------------------- Study Conclusions  - Left ventricle: The cavity size was normal. Wall thickness was normal. Systolic function was normal. - Aortic valve: No evidence of vegetation. No evidence of vegetation. - Mitral valve: No evidence of vegetation. No evidence of vegetation. - Atrial septum: There was a medium-sized patent foramen ovale. - Tricuspid valve: No evidence of vegetation.  ------------------------------------------------------------------- Study data:   Study status:  Routine.  Consent:  The risks, benefits, and alternatives to the procedure were explained to the patient and informed consent was obtained.  Procedure:  Initial setup. The patient was brought to the laboratory. Surface ECG leads were monitored. Sedation. Conscious sedation was administered by cardiology staff. Transesophageal echocardiography. Topical anesthesia was obtained using viscous lidocaine. A transesophageal probe was inserted by the attending cardiologistwithout difficulty. Image quality was adequate.  Intravenous contrast (agitated saline) was administered.  Study completion:  The patient tolerated the procedure well. There were no complications.  Administered medications:   Midazolam, 4mg , IV.  Fentanyl, , IV. Diagnostic transesophageal echocardiography.  2D and color Doppler. Birthdate:  Patient birthdate: Nov 09, 1950.  Age:  Patient is 72 yr old.  Sex:  Gender: female.  Blood pressure:     154/72  Patient status:  Inpatient.  Study date:  Study date: 03/16/2017. Study time:  01:46 PM.  Location:  Endoscopy.  -------------------------------------------------------------------  ------------------------------------------------------------------- Left ventricle:  The cavity size was normal. Wall thickness was normal. Systolic function was normal.  ------------------------------------------------------------------- Aortic valve:   Structurally normal valve.   Cusp separation was normal.  No evidence of vegetation.  No evidence of vegetation. Doppler:  There was no regurgitation.  ------------------------------------------------------------------- Aorta:  The aorta was normal, not dilated, and non-diseased.  ------------------------------------------------------------------- Mitral valve:   Structurally normal valve.   Leaflet separation was normal.  No evidence of vegetation.  No evidence of vegetation. Doppler:  There was trivial regurgitation.  ------------------------------------------------------------------- Atrial septum:  There is a PFO with right to left flow seen with bubble study. There was a medium-sized patent foramen ovale.  ------------------------------------------------------------------- Tricuspid valve:   Structurally normal valve.   Leaflet separation was normal.  No evidence of vegetation.  Doppler:  There was trivial regurgitation.  ------------------------------------------------------------------- Post procedure conclusions Ascending Aorta:  - The  aorta was normal, not dilated, and non-diseased.  ------------------------------------------------------------------- Prepared and Electronically Authenticated by  Kristeen Miss, M.D. 2018-07-03T17:42:08   MONITORS  LONG TERM MONITOR (3-14 DAYS) 04/06/2023  Narrative Predominant rhythm was normal sinus One SVT run Frequent isolated supraventricular ectopic beats One run of SVT lasting 13 seconds No sustained arrhythmias.          Recent Labs: 01/28/2023: Magnesium 2.0 05/06/2023: ALT 10 05/11/2023: BUN 15; Creatinine, Ser 1.17; Hemoglobin 11.4; Platelets 161; Potassium 3.5; Sodium 135  Recent Lipid Panel    Component Value Date/Time   CHOL 118 05/06/2023 0858   TRIG 84 05/06/2023 0858   HDL 41 05/06/2023 0858   CHOLHDL 2.9 05/06/2023 0858   CHOLHDL 3.3 01/24/2023 0501   VLDL 19 01/24/2023 0501   LDLCALC 60 05/06/2023 0858   LDLDIRECT 63 11/27/2019 1428   LDLDIRECT 50.0 12/21/2017 1044    History of Present Illness    72 year old female with the above past medical history including CAD s/p NSTEMI in 2019 with occluded OM2, managed medically, palpitations, PSVT, cryptogenic stroke s/p ILR, PFO, PE, hypertension, hyperlipidemia, and GERD.  She has a history of CAD as above.  Echocardiogram in 2019 showed EF 55 to 60%, mild concentric LVH, G1 DD, normal RV, mild TR.  Cardiac monitor in 03/2023 showed predominantly sinus rhythm, 1 run of SVT, frequent isolated PACs.  She has had frequent ED visits, most of which have been related to weakness, GI complaints.  She was hospitalized in May 2024, apparently she was diagnosed with PE and started on Eliquis with recommended 6 months of therapy.  She was last seen in office on 07/14/2023 and was stable from a cardiac standpoint.  She denied any new cardiovascular complaints.  It was recommended she return for an office visit in December 2024 to ensure discontinuation of Eliquis with plans to transition her back to aspirin.  She presents  today for follow-up accompanied by her husband.  Since her last visit she has been stable from a cardiac standpoint.  She denies any symptoms concerning for angina.  Denies bleeding on Eliquis.  She does report bilateral leg pain in the setting of arthritis, otherwise, she denies any cardiac concerns today.  Home Medications    Current Outpatient Medications  Medication Sig Dispense Refill   ACCU-CHEK AVIVA PLUS test strip USE AS INSTRUCTED ONCE A DAY. DX CODE E11.9 100 strip 1   Accu-Chek Softclix Lancets lancets Use as instructed once a day.  DX Code: E11.9 100 each 1   acetaminophen (TYLENOL) 500 MG tablet  Take 1,000 mg by mouth every 6 (six) hours as needed for mild pain or moderate pain.     allopurinol (ZYLOPRIM) 300 MG tablet Take 1 tablet (300 mg total) by mouth 2 (two) times daily. 180 tablet 1   apixaban (ELIQUIS) 5 MG TABS tablet Take 1 tablet (5 mg total) by mouth 2 (two) times daily. 60 tablet 2   CALCIUM PO Take 2 tablets by mouth daily.     dicyclomine (BENTYL) 20 MG tablet TAKE 1 TABLET (20 MG TOTAL) BY MOUTH 2 (TWO) TIMES DAILY AS NEEDED FOR SPASMS (ABDOMINAL CRAMPING). 20 tablet 1   DULoxetine (CYMBALTA) 60 MG capsule Take 1 capsule (60 mg total) by mouth daily. 90 capsule 3   Emollient (UDDERLY SMOOTH) CREA Apply 1 application topically See admin instructions. Apply topically to skin folds as needed for rash/irritation     fenofibrate 160 MG tablet Take 1 tablet (160 mg total) by mouth daily. 90 tablet 3   ferrous sulfate 325 (65 FE) MG tablet TAKE 1 TABLET BY MOUTH TWICE A DAY 180 tablet 3   gabapentin (NEURONTIN) 300 MG capsule TAKE 1 CAPSULE BY MOUTH EVERYDAY AT BEDTIME 30 capsule 1   isosorbide mononitrate (IMDUR) 30 MG 24 hr tablet TAKE 1 TABLET BY MOUTH EVERY DAY 90 tablet 0   olmesartan (BENICAR) 5 MG tablet Take 1 tablet (5 mg total) by mouth daily. 90 tablet 3   ondansetron (ZOFRAN-ODT) 4 MG disintegrating tablet Take 1 tablet (4 mg total) by mouth every 8 (eight)  hours as needed for nausea or vomiting. 12 tablet 0   pantoprazole (PROTONIX) 40 MG tablet TAKE 1 TABLET BY MOUTH EVERY DAY 90 tablet 3   polyethylene glycol (MIRALAX / GLYCOLAX) 17 g packet Take 17 g by mouth daily as needed for moderate constipation.     rosuvastatin (CRESTOR) 40 MG tablet TAKE 1 TABLET BY MOUTH EVERY DAY 90 tablet 2   famotidine (PEPCID) 10 MG tablet Take 1 tablet (10 mg total) by mouth 2 (two) times daily for 5 days. And then afterwards take as needed for heartburn 30 tablet 0   sucralfate (CARAFATE) 1 GM/10ML suspension Take 10 mLs (1 g total) by mouth 4 (four) times daily -  with meals and at bedtime. (Patient not taking: Reported on 08/17/2023) 420 mL 0   No current facility-administered medications for this visit.     Review of Systems    She denies chest pain, palpitations, dyspnea, pnd, orthopnea, n, v, dizziness, syncope, edema, weight gain, or early satiety. All other systems reviewed and are otherwise negative except as noted above.   Physical Exam    VS:  BP 110/68 (BP Location: Left Arm, Patient Position: Sitting, Cuff Size: Normal)   Pulse 72   Ht 5\' 6"  (1.676 m)   Wt 189 lb (85.7 kg)   SpO2 99%   BMI 30.51 kg/m   GEN: Well nourished, well developed, in no acute distress. HEENT: normal. Neck: Supple, no JVD, carotid bruits, or masses. Cardiac: RRR, no murmurs, rubs, or gallops. No clubbing, cyanosis, edema.  Radials/DP/PT 2+ and equal bilaterally.  Respiratory:  Respirations regular and unlabored, clear to auscultation bilaterally. GI: Soft, nontender, nondistended, BS + x 4. MS: no deformity or atrophy. Skin: warm and dry, no rash. Neuro:  Strength and sensation are intact. Psych: Normal affect.  Accessory Clinical Findings    ECG personally reviewed by me today -    - no EKG in office today.    Lab Results  Component Value Date   WBC 5.7 05/11/2023   HGB 11.4 (L) 05/11/2023   HCT 34.4 (L) 05/11/2023   MCV 95.6 05/11/2023   PLT 161  05/11/2023   Lab Results  Component Value Date   CREATININE 1.17 (H) 05/11/2023   BUN 15 05/11/2023   NA 135 05/11/2023   K 3.5 05/11/2023   CL 105 05/11/2023   CO2 22 05/11/2023   Lab Results  Component Value Date   ALT 10 05/06/2023   AST 15 05/06/2023   ALKPHOS 59 05/06/2023   BILITOT 0.4 05/06/2023   Lab Results  Component Value Date   CHOL 118 05/06/2023   HDL 41 05/06/2023   LDLCALC 60 05/06/2023   LDLDIRECT 63 11/27/2019   TRIG 84 05/06/2023   CHOLHDL 2.9 05/06/2023    Lab Results  Component Value Date   HGBA1C 5.4 05/06/2023    Assessment & Plan   1. CAD:  S/p NSTEMI in 2019 with occluded OM2, managed medically. Stable with no anginal symptoms. No indication for ischemic evaluation.  Will restart aspirin in the setting of discontinuation of Eliquis as below.  Continue olmesartan, Imdur, Crestor, and fenofibrate.  2. PSVT/palpitations: Cardiac monitor in 03/2023 showed predominantly sinus rhythm, 1 run of SVT, frequent isolated PACs. Denies any significant palpitations.   3. History of stroke/PFO: No recurrence. S/p ILR. Echocardiogram in 2019 showed EF 55 to 60%, mild concentric LVH, G1 DD, normal RV, mild TR. Restart ASA as above.   4. History of PE: She has completed 6 months of anticoagulation therapy.  Will discontinue Eliquis per Dr. Jenene Slicker recommendation. Resume ASA as above.    5. Hypertension: BP well controlled. Continue current antihypertensive regimen.   6. Hyperlipidemia: LDL was 60 in 04/2023.  Continue Crestor, fenofibrate.  7. Disposition: Follow-up in 1 year, sooner if needed.       Joylene Grapes, NP 08/17/2023, 1:53 PM

## 2023-08-17 NOTE — Patient Instructions (Signed)
Medication Instructions:  Stop Eliquis as directed. Start Asprin 81 mg daily.  *If you need a refill on your cardiac medications before your next appointment, please call your pharmacy*   Lab Work: NONE ordered at this time of appointment     Testing/Procedures: NONE ordered at this time of appointment     Follow-Up: At [facility], you and your health needs are our priority.  As part of our continuing mission to provide you with exceptional heart care, we have created designated Provider Care Teams.  These Care Teams include your primary Cardiologist (physician) and Advanced Practice Providers (APPs -  Physician Assistants and Nurse Practitioners) who all work together to provide you with the care you need, when you need it.  We recommend signing up for the patient portal called "MyChart".  Sign up information is provided on this After Visit Summary.  MyChart is used to connect with patients for Virtual Visits (Telemedicine).  Patients are able to view lab/test results, encounter notes, upcoming appointments, etc.  Non-urgent messages can be sent to your provider as well.   To learn more about what you can do with MyChart, go to [email or web address].    Your next appointment:   1 year(s)  Provider:   Rollene Rotunda, MD     Other Instructions

## 2023-08-30 ENCOUNTER — Other Ambulatory Visit: Payer: Self-pay | Admitting: Cardiology

## 2023-09-02 ENCOUNTER — Other Ambulatory Visit: Payer: Self-pay | Admitting: Family Medicine

## 2023-09-02 DIAGNOSIS — F419 Anxiety disorder, unspecified: Secondary | ICD-10-CM

## 2023-09-02 DIAGNOSIS — K581 Irritable bowel syndrome with constipation: Secondary | ICD-10-CM

## 2023-09-09 ENCOUNTER — Other Ambulatory Visit: Payer: Self-pay | Admitting: Family Medicine

## 2023-09-09 DIAGNOSIS — F419 Anxiety disorder, unspecified: Secondary | ICD-10-CM

## 2023-09-09 DIAGNOSIS — K581 Irritable bowel syndrome with constipation: Secondary | ICD-10-CM

## 2023-09-10 ENCOUNTER — Other Ambulatory Visit: Payer: Self-pay | Admitting: Family Medicine

## 2023-09-10 DIAGNOSIS — F419 Anxiety disorder, unspecified: Secondary | ICD-10-CM

## 2023-09-10 DIAGNOSIS — K581 Irritable bowel syndrome with constipation: Secondary | ICD-10-CM

## 2023-09-10 MED ORDER — GABAPENTIN 300 MG PO CAPS: 300.0000 mg | ORAL_CAPSULE | Freq: Every day | ORAL | 1 refills | Status: DC

## 2023-09-10 NOTE — Telephone Encounter (Signed)
Refill sent in

## 2023-09-10 NOTE — Telephone Encounter (Signed)
Copied from CRM [id number]. Topic: Clinical - Medication Refill >> Sep 10, 2023 10:37 AM Elle L wrote: Most Recent Primary Care Visit:  Provider: Saralyn Pilar A  Department: [facility]  Visit Type: OFFICE VISIT 20  Date: 05/12/2023  Medication: 09/09/23 1239 Reorder Thad Ranger, RMA  Has the patient contacted their pharmacy?  (Agent: If no, request that the patient contact the pharmacy for the refill. If patient does not wish to contact the pharmacy document the reason why and proceed with request.) (Agent: If yes, when and what did the pharmacy advise?)  Is this the correct pharmacy for this prescription?  If no, delete pharmacy and type the correct one.  This is the patient's preferred pharmacy:  [facility] [address] [place] - [address] [place] [address] Phone: [phone] Fax: [phone]   Has the prescription been filled recently?   Is the patient out of the medication?   Has the patient been seen for an appointment in the last year OR does the patient have an upcoming appointment?   Can we respond through MyChart?   Agent: Please be advised that Rx refills may take up to 3 business days. We ask that you follow-up with your pharmacy.

## 2023-09-10 NOTE — Addendum Note (Signed)
Addended by: Sandre Kitty on: 09/10/2023 11:39 AM   Modules accepted: Orders

## 2023-09-10 NOTE — Telephone Encounter (Signed)
Copied from CRM [id number]. Topic: Clinical - Medication Refill >> Sep 10, 2023 10:37 AM Elle L wrote: Most Recent Primary Care Visit:  Provider: Saralyn Pilar A  Department: [facility]  Visit Type: OFFICE VISIT 20  Date: 05/12/2023  Medication: gabapentin (NEURONTIN) 300 MG capsule [Pharmacy Med Name: GABAPENTIN 300 MG CAPSULE]  Has the patient contacted their pharmacy? Yes  Is this the correct pharmacy for this prescription? Yes  [id number] [place], [place] - [address] [place] [address] Phone: [phone] Fax: [phone]   Has the prescription been filled recently? Yes  Is the patient out of the medication? Yes  Has the patient been seen for an appointment in the last year OR does the patient have an upcoming appointment? Yes  Can we respond through MyChart? No  Agent: Please be advised that Rx refills may take up to 3 business days. We ask that you follow-up with your pharmacy.

## 2023-09-10 NOTE — Telephone Encounter (Signed)
Copied from CRM [id number]. Topic: Clinical - Medication Question >> Sep 10, 2023 10:42 AM Elle L wrote: Reason for CRM: The patient is concerned regarding being out of her gabapentin (NEURONTIN) 300 MG capsule and wanted to see if there was anything she should do. Her call back number is [phone].

## 2023-10-05 ENCOUNTER — Other Ambulatory Visit: Payer: Self-pay | Admitting: Family Medicine

## 2023-10-05 DIAGNOSIS — M1000 Idiopathic gout, unspecified site: Secondary | ICD-10-CM

## 2023-10-21 ENCOUNTER — Encounter: Payer: Self-pay | Admitting: Family Medicine

## 2023-10-27 ENCOUNTER — Other Ambulatory Visit: Payer: Self-pay | Admitting: Family Medicine

## 2023-10-27 DIAGNOSIS — F419 Anxiety disorder, unspecified: Secondary | ICD-10-CM

## 2023-10-27 DIAGNOSIS — K581 Irritable bowel syndrome with constipation: Secondary | ICD-10-CM

## 2023-12-01 ENCOUNTER — Other Ambulatory Visit: Payer: Self-pay | Admitting: Family Medicine

## 2023-12-01 DIAGNOSIS — M1000 Idiopathic gout, unspecified site: Secondary | ICD-10-CM

## 2023-12-30 ENCOUNTER — Other Ambulatory Visit: Payer: Self-pay | Admitting: Family Medicine

## 2023-12-30 DIAGNOSIS — K581 Irritable bowel syndrome with constipation: Secondary | ICD-10-CM

## 2023-12-30 DIAGNOSIS — F419 Anxiety disorder, unspecified: Secondary | ICD-10-CM

## 2024-02-17 ENCOUNTER — Other Ambulatory Visit: Payer: Self-pay | Admitting: Family Medicine

## 2024-02-17 ENCOUNTER — Other Ambulatory Visit: Payer: Self-pay | Admitting: Internal Medicine

## 2024-02-17 DIAGNOSIS — F419 Anxiety disorder, unspecified: Secondary | ICD-10-CM

## 2024-02-17 DIAGNOSIS — K581 Irritable bowel syndrome with constipation: Secondary | ICD-10-CM

## 2024-02-17 DIAGNOSIS — R1013 Epigastric pain: Secondary | ICD-10-CM

## 2024-02-18 NOTE — Telephone Encounter (Signed)
 LVM to call office to schedule an appointment so we can get the refill sent in.  Last appt was 04/2023 and she was to have a follow up with labs the week before around 08/12/23, PLease assist in getting these appointments scheduled.

## 2024-03-15 ENCOUNTER — Encounter (HOSPITAL_COMMUNITY): Payer: Self-pay | Admitting: Emergency Medicine

## 2024-03-15 ENCOUNTER — Emergency Department (HOSPITAL_COMMUNITY)

## 2024-03-15 ENCOUNTER — Emergency Department (HOSPITAL_COMMUNITY)
Admission: EM | Admit: 2024-03-15 | Discharge: 2024-03-16 | Disposition: A | Discharge status: Home / Self Care | Attending: Emergency Medicine | Admitting: Emergency Medicine

## 2024-03-15 DIAGNOSIS — R1084 Generalized abdominal pain: Secondary | ICD-10-CM | POA: Diagnosis present

## 2024-03-15 DIAGNOSIS — E871 Hypo-osmolality and hyponatremia: Secondary | ICD-10-CM | POA: Diagnosis not present

## 2024-03-15 DIAGNOSIS — E119 Type 2 diabetes mellitus without complications: Secondary | ICD-10-CM | POA: Diagnosis not present

## 2024-03-15 DIAGNOSIS — R52 Pain, unspecified: Secondary | ICD-10-CM

## 2024-03-15 DIAGNOSIS — I10 Essential (primary) hypertension: Secondary | ICD-10-CM | POA: Diagnosis not present

## 2024-03-15 DIAGNOSIS — Z79899 Other long term (current) drug therapy: Secondary | ICD-10-CM | POA: Diagnosis not present

## 2024-03-15 LAB — CBC WITH DIFFERENTIAL/PLATELET
Abs Immature Granulocytes: 0.04 10*3/uL (ref 0.00–0.07)
Basophils Absolute: 0 10*3/uL (ref 0.0–0.1)
Basophils Relative: 0 %
Eosinophils Absolute: 0 10*3/uL (ref 0.0–0.5)
Eosinophils Relative: 0 %
HCT: 38 % (ref 36.0–46.0)
Hemoglobin: 12.3 g/dL (ref 12.0–15.0)
Immature Granulocytes: 1 %
Lymphocytes Relative: 19 %
Lymphs Abs: 1.4 10*3/uL (ref 0.7–4.0)
MCH: 32.8 pg (ref 26.0–34.0)
MCHC: 32.4 g/dL (ref 30.0–36.0)
MCV: 101.3 fL — ABNORMAL HIGH (ref 80.0–100.0)
Monocytes Absolute: 0.4 10*3/uL (ref 0.1–1.0)
Monocytes Relative: 5 %
Neutro Abs: 5.4 10*3/uL (ref 1.7–7.7)
Neutrophils Relative %: 75 %
Platelets: 193 10*3/uL (ref 150–400)
RBC: 3.75 MIL/uL — ABNORMAL LOW (ref 3.87–5.11)
RDW: 13.1 % (ref 11.5–15.5)
WBC: 7.2 10*3/uL (ref 4.0–10.5)
nRBC: 0 % (ref 0.0–0.2)

## 2024-03-15 LAB — COMPREHENSIVE METABOLIC PANEL WITH GFR
ALT: 13 U/L (ref 0–44)
AST: 18 U/L (ref 15–41)
Albumin: 3.9 g/dL (ref 3.5–5.0)
Alkaline Phosphatase: 43 U/L (ref 38–126)
Anion gap: 11 (ref 5–15)
BUN: 20 mg/dL (ref 8–23)
CO2: 19 mmol/L — ABNORMAL LOW (ref 22–32)
Calcium: 9.7 mg/dL (ref 8.9–10.3)
Chloride: 104 mmol/L (ref 98–111)
Creatinine, Ser: 1.31 mg/dL — ABNORMAL HIGH (ref 0.44–1.00)
GFR, Estimated: 44 mL/min — ABNORMAL LOW (ref >60)
Glucose, Bld: 104 mg/dL — ABNORMAL HIGH (ref 70–99)
Potassium: 4.2 mmol/L (ref 3.5–5.1)
Sodium: 134 mmol/L — ABNORMAL LOW (ref 135–145)
Total Bilirubin: 0.7 mg/dL (ref 0.0–1.2)
Total Protein: 6.4 g/dL — ABNORMAL LOW (ref 6.5–8.1)

## 2024-03-15 LAB — LIPASE, BLOOD: Lipase: 40 U/L (ref 11–51)

## 2024-03-15 MED ORDER — IOHEXOL 350 MG/ML SOLN
75.0000 mL | Freq: Once | INTRAVENOUS | Status: AC | PRN
  Administered 2024-03-15: 75 mL via INTRAVENOUS

## 2024-03-15 NOTE — ED Notes (Signed)
 Pt has hx of gallbladder removal

## 2024-03-15 NOTE — ED Notes (Signed)
 Small bowel movement today but endorsing constipation.

## 2024-03-15 NOTE — ED Triage Notes (Signed)
 Pt c.o right sided abd pain that radiates to her side. Nausea but no vomiting.

## 2024-03-15 NOTE — ED Provider Triage Note (Signed)
 Emergency Medicine Provider Triage Evaluation Note  Rachel Black , a 73 y.o. female  was evaluated in triage.  Pt complains of diffuse abdominal pain for last 3 days.  Radiates to the back.  Denies any urinary symptoms.  Reports associated nausea without vomiting.  Denies diarrhea, fever, chills.  Review of Systems  Positive:  Negative: See above   Physical Exam  BP 104/85 (BP Location: Right Arm)   Pulse (!) 107   Temp 98.3 F (36.8 C)   Resp 14   SpO2 98%  Gen:   Awake, no distress   Resp:  Normal effort  MSK:   Moves extremities without difficulty  Other:  Mild diffuse abdominal tenderness.  Medical Decision Making  Medically screening exam initiated at 8:27 PM.  Appropriate orders placed.  [name] was informed that the remainder of the evaluation will be completed by another provider, this initial triage assessment does not replace that evaluation, and the importance of remaining in the ED until their evaluation is complete.     Theotis Peers [place], [place] 03/15/24 2028

## 2024-03-16 ENCOUNTER — Encounter (HOSPITAL_COMMUNITY): Payer: Self-pay

## 2024-03-16 ENCOUNTER — Emergency Department (HOSPITAL_COMMUNITY)

## 2024-03-16 ENCOUNTER — Other Ambulatory Visit: Payer: Self-pay

## 2024-03-16 ENCOUNTER — Inpatient Hospital Stay (HOSPITAL_COMMUNITY)
Admission: EM | Admit: 2024-03-16 | Discharge: 2024-03-28 | DRG: 494 | Disposition: A | Discharge status: Skilled Nursing Facility | Attending: Internal Medicine | Admitting: Internal Medicine

## 2024-03-16 DIAGNOSIS — W109XXA Fall (on) (from) unspecified stairs and steps, initial encounter: Secondary | ICD-10-CM | POA: Diagnosis present

## 2024-03-16 DIAGNOSIS — I10 Essential (primary) hypertension: Secondary | ICD-10-CM | POA: Diagnosis present

## 2024-03-16 DIAGNOSIS — Y92009 Unspecified place in unspecified non-institutional (private) residence as the place of occurrence of the external cause: Secondary | ICD-10-CM

## 2024-03-16 DIAGNOSIS — D519 Vitamin B12 deficiency anemia, unspecified: Secondary | ICD-10-CM | POA: Diagnosis present

## 2024-03-16 DIAGNOSIS — K219 Gastro-esophageal reflux disease without esophagitis: Secondary | ICD-10-CM | POA: Diagnosis present

## 2024-03-16 DIAGNOSIS — E119 Type 2 diabetes mellitus without complications: Secondary | ICD-10-CM | POA: Diagnosis present

## 2024-03-16 DIAGNOSIS — G8929 Other chronic pain: Secondary | ICD-10-CM | POA: Diagnosis present

## 2024-03-16 DIAGNOSIS — Z96653 Presence of artificial knee joint, bilateral: Secondary | ICD-10-CM | POA: Diagnosis present

## 2024-03-16 DIAGNOSIS — Z83438 Family history of other disorder of lipoprotein metabolism and other lipidemia: Secondary | ICD-10-CM

## 2024-03-16 DIAGNOSIS — Z79899 Other long term (current) drug therapy: Secondary | ICD-10-CM

## 2024-03-16 DIAGNOSIS — I959 Hypotension, unspecified: Secondary | ICD-10-CM | POA: Diagnosis present

## 2024-03-16 DIAGNOSIS — Y92008 Other place in unspecified non-institutional (private) residence as the place of occurrence of the external cause: Secondary | ICD-10-CM

## 2024-03-16 DIAGNOSIS — Z90711 Acquired absence of uterus with remaining cervical stump: Secondary | ICD-10-CM

## 2024-03-16 DIAGNOSIS — Z8249 Family history of ischemic heart disease and other diseases of the circulatory system: Secondary | ICD-10-CM

## 2024-03-16 DIAGNOSIS — M159 Polyosteoarthritis, unspecified: Secondary | ICD-10-CM | POA: Diagnosis present

## 2024-03-16 DIAGNOSIS — N289 Disorder of kidney and ureter, unspecified: Secondary | ICD-10-CM | POA: Diagnosis present

## 2024-03-16 DIAGNOSIS — Z833 Family history of diabetes mellitus: Secondary | ICD-10-CM

## 2024-03-16 DIAGNOSIS — W19XXXA Unspecified fall, initial encounter: Secondary | ICD-10-CM

## 2024-03-16 DIAGNOSIS — Z91048 Other nonmedicinal substance allergy status: Secondary | ICD-10-CM

## 2024-03-16 DIAGNOSIS — R031 Nonspecific low blood-pressure reading: Secondary | ICD-10-CM | POA: Diagnosis present

## 2024-03-16 DIAGNOSIS — M109 Gout, unspecified: Secondary | ICD-10-CM | POA: Diagnosis present

## 2024-03-16 DIAGNOSIS — D539 Nutritional anemia, unspecified: Secondary | ICD-10-CM | POA: Diagnosis present

## 2024-03-16 DIAGNOSIS — Z880 Allergy status to penicillin: Secondary | ICD-10-CM

## 2024-03-16 DIAGNOSIS — Z823 Family history of stroke: Secondary | ICD-10-CM

## 2024-03-16 DIAGNOSIS — Z9049 Acquired absence of other specified parts of digestive tract: Secondary | ICD-10-CM

## 2024-03-16 DIAGNOSIS — S93432A Sprain of tibiofibular ligament of left ankle, initial encounter: Secondary | ICD-10-CM | POA: Diagnosis present

## 2024-03-16 DIAGNOSIS — D529 Folate deficiency anemia, unspecified: Secondary | ICD-10-CM | POA: Diagnosis present

## 2024-03-16 DIAGNOSIS — I252 Old myocardial infarction: Secondary | ICD-10-CM

## 2024-03-16 DIAGNOSIS — R1084 Generalized abdominal pain: Secondary | ICD-10-CM | POA: Diagnosis present

## 2024-03-16 DIAGNOSIS — S82852A Displaced trimalleolar fracture of left lower leg, initial encounter for closed fracture: Principal | ICD-10-CM | POA: Diagnosis present

## 2024-03-16 DIAGNOSIS — Z8673 Personal history of transient ischemic attack (TIA), and cerebral infarction without residual deficits: Secondary | ICD-10-CM

## 2024-03-16 DIAGNOSIS — E782 Mixed hyperlipidemia: Secondary | ICD-10-CM | POA: Diagnosis present

## 2024-03-16 DIAGNOSIS — E66811 Obesity, class 1: Secondary | ICD-10-CM | POA: Diagnosis present

## 2024-03-16 DIAGNOSIS — K581 Irritable bowel syndrome with constipation: Secondary | ICD-10-CM | POA: Diagnosis present

## 2024-03-16 DIAGNOSIS — Z803 Family history of malignant neoplasm of breast: Secondary | ICD-10-CM

## 2024-03-16 DIAGNOSIS — Z6831 Body mass index (BMI) 31.0-31.9, adult: Secondary | ICD-10-CM

## 2024-03-16 MED ORDER — OXYCODONE-ACETAMINOPHEN 5-325 MG PO TABS
1.0000 | ORAL_TABLET | Freq: Four times a day (QID) | ORAL | Status: AC | PRN
  Administered 2024-03-16 – 2024-03-24 (×5): 1 via ORAL
  Filled 2024-03-16 (×7): qty 1

## 2024-03-16 MED ORDER — FENTANYL CITRATE PF 50 MCG/ML IJ SOSY
50.0000 ug | PREFILLED_SYRINGE | Freq: Once | INTRAMUSCULAR | Status: AC
  Administered 2024-03-16: 50 ug via INTRAVENOUS
  Filled 2024-03-16: qty 1

## 2024-03-16 NOTE — ED Notes (Signed)
 Pt unable to urinate.

## 2024-03-16 NOTE — ED Notes (Addendum)
 Upon changing pt into gown, underneath belly, pt has redness and what appears to be open skin. A lot of moisture and pt reported it hurting.

## 2024-03-16 NOTE — ED Provider Notes (Signed)
 [place] EMERGENCY DEPARTMENT AT [facility] Provider Note   CSN: [id number] Arrival date & time: 03/16/24  9049     Patient presents with: Hip Pain, Ankle Pain, and Shoulder Pain   Rachel Black is a 73 y.o. female.   Rachel Black is a 72 y.o. female with a history of hypertension, hyperlipidemia, GERD, diabetes, who presents to the emergency department via EMS after a ground-level fall.  Patient had been seen in the ED overnight and was just arriving home with her husband after being discharged and as she went to walk from the car into the house she fell on the concrete and since then has had pain in her left hip and left ankle and has been unable to bear weight on the left leg.  Patient does not think she hit her head but is not completely sure.  She is not on blood thinners.  She was evaluated in the ED last night for generalized body pain and abdominal pain and reports she still has some generalized pain but focal new pain that has changed is present primarily in the left ankle and left hip.  At baseline patient has had some issues with instability and sometimes uses a walker to get around.  The history is provided by the patient, the spouse, the EMS personnel and medical records.  Hip Pain  Ankle Pain Associated symptoms: no fever   Shoulder Pain Associated symptoms: no fever        Prior to Admission medications   Medication Sig Start Date End Date Taking? Authorizing Provider  ACCU-CHEK AVIVA PLUS test strip USE AS INSTRUCTED ONCE A DAY. DX CODE E11.9 07/20/22   Abonza, Maritza, PA-C  Accu-Chek Softclix Lancets lancets Use as instructed once a day.  DX Code: E11.9 03/24/22   Abonza, Maritza, PA-C  acetaminophen  (TYLENOL ) 500 MG tablet Take 1,000 mg by mouth every 6 (six) hours as needed for mild pain or moderate pain.    [provider]  allopurinol  (ZYLOPRIM ) 300 MG tablet TAKE 1 TABLET BY MOUTH 2 TIMES DAILY. 12/01/23   Chandra Toribio [name], MD  CALCIUM  PO Take  2 tablets by mouth daily.    [provider]  dicyclomine  (BENTYL ) 20 MG tablet TAKE 1 TABLET (20 MG TOTAL) BY MOUTH 2 (TWO) TIMES DAILY AS NEEDED FOR SPASMS (ABDOMINAL CRAMPING). 06/09/23   Wallace Joesph LABOR, PA  DULoxetine  (CYMBALTA ) 60 MG capsule Take 1 capsule (60 mg total) by mouth daily. 05/12/23   Wallace Joesph A, PA  Emollient (UDDERLY SMOOTH) CREA Apply 1 application topically See admin instructions. Apply topically to skin folds as needed for rash/irritation    [provider]  famotidine  (PEPCID ) 10 MG tablet Take 1 tablet (10 mg total) by mouth 2 (two) times daily for 5 days. And then afterwards take as needed for heartburn 04/26/23 05/01/23  Mesner, Selinda, MD  fenofibrate  160 MG tablet Take 1 tablet (160 mg total) by mouth daily. 05/12/23   Wallace Joesph LABOR, PA  ferrous sulfate  325 (65 FE) MG tablet TAKE 1 TABLET BY MOUTH TWICE A DAY 08/16/23   Abran Norleen SAILOR, MD  gabapentin  (NEURONTIN ) 300 MG capsule TAKE 1 CAPSULE BY MOUTH EVERYDAY AT BEDTIME 12/30/23   Chandra Toribio [name], MD  isosorbide  mononitrate (IMDUR ) 30 MG 24 hr tablet TAKE 1 TABLET BY MOUTH EVERY DAY 08/31/23   Daneen Damien BROCKS, NP  olmesartan  (BENICAR ) 5 MG tablet Take 1 tablet (5 mg total) by mouth daily. 05/12/23  Wallace Search A, PA  ondansetron  (ZOFRAN -ODT) 4 MG disintegrating tablet Take 1 tablet (4 mg total) by mouth every 8 (eight) hours as needed for nausea or vomiting. 04/03/23   Yolande Lamar BROCKS, MD  pantoprazole  (PROTONIX ) 40 MG tablet TAKE 1 TABLET BY MOUTH EVERY DAY 02/18/24   Abran Norleen SAILOR, MD  polyethylene glycol (MIRALAX  / GLYCOLAX ) 17 g packet Take 17 g by mouth daily as needed for moderate constipation.    [provider]  rosuvastatin  (CRESTOR ) 40 MG tablet TAKE 1 TABLET BY MOUTH EVERY DAY 07/28/23   Lavona Agent, MD  sucralfate  (CARAFATE ) 1 GM/10ML suspension Take 10 mLs (1 g total) by mouth 4 (four) times daily -  with meals and at bedtime. Patient not taking: Reported on 08/17/2023  04/26/23   Lorette Mayo, MD    Allergies: Other and Penicillins    Review of Systems  Constitutional:  Negative for chills and fever.  Musculoskeletal:  Positive for arthralgias.    Updated Vital Signs BP (!) 130/58 (BP Location: Right Wrist)   Pulse (!) 44   Temp 98.1 F (36.7 C) (Oral)   Resp 18   Ht 5' 6 (1.676 m)   Wt 86 kg   SpO2 100%   BMI 30.60 kg/m   Physical Exam Vitals and nursing note reviewed.  Constitutional:      General: She is not in acute distress.    Appearance: Normal appearance. She is well-developed. She is not diaphoretic.  HENT:     Head: Normocephalic and atraumatic.  Eyes:     General:        Right eye: No discharge.        Left eye: No discharge.     Pupils: Pupils are equal, round, and reactive to light.  Cardiovascular:     Rate and Rhythm: Normal rate and regular rhythm.     Pulses: Normal pulses.     Heart sounds: Normal heart sounds.  Pulmonary:     Effort: Pulmonary effort is normal. No respiratory distress.     Breath sounds: Normal breath sounds. No wheezing or rales.     Comments: Respirations equal and unlabored, patient able to speak in full sentences, lungs clear to auscultation bilaterally  Abdominal:     General: Bowel sounds are normal. There is no distension.     Palpations: Abdomen is soft. There is no mass.     Tenderness: There is abdominal tenderness. There is no guarding.     Comments: Mild generalized tenderness, unchanged from evaluation last night per patient  Musculoskeletal:        General: Swelling and tenderness present.     Cervical back: Neck supple.     Comments: Tenderness and swelling noted over the left ankle without ecchymosis or obvious deformity.  There is also some tenderness present over the lateral left hip without ecchymosis.  DP and PT pulses 2+, normal cap refill.  No focal bony tenderness or deformity at the knee, all other comparments soft.  Skin:    General: Skin is warm and dry.      Capillary Refill: Capillary refill takes less than 2 seconds.  Neurological:     Mental Status: She is alert and oriented to person, place, and time.     Coordination: Coordination normal.     Comments: Speech is clear, able to follow commands CN III-XII intact Normal strength in upper and lower extremities bilaterally including dorsiflexion and plantar flexion, strong and equal grip strength Sensation normal  to light and sharp touch Moves extremities without ataxia, coordination intact  Psychiatric:        Mood and Affect: Mood normal.        Behavior: Behavior normal.     (all labs ordered are listed, but only abnormal results are displayed) Labs Reviewed - No data to display  EKG: EKG Interpretation Date/Time:  Thursday March 16 2024 10:08:17 EDT Ventricular Rate:  87 PR Interval:  95 QRS Duration:  106 QT Interval:  366 QTC Calculation: 441 R Axis:   227  Text Interpretation: Sinus rhythm Multiform ventricular premature complexes Short PR interval Right atrial enlargement Markedly posterior QRS axis Consider anterior infarct Confirmed by Bari Flank [phone]) on 03/16/2024 11:37:11 AM  Radiology: CT Ankle Left Wo Contrast Result Date: 03/16/2024 CLINICAL DATA:  Injury after fall. EXAM: CT OF THE LEFT ANKLE WITHOUT CONTRAST TECHNIQUE: Multidetector CT imaging of the left ankle was performed according to the standard protocol. Multiplanar CT image reconstructions were also generated. RADIATION DOSE REDUCTION: This exam was performed according to the departmental dose-optimization program which includes automated exposure control, adjustment of the mA and/or kV according to patient size and/or use of iterative reconstruction technique. COMPARISON:  Same day left ankle radiographs dated 03/16/2024. FINDINGS: Bones/Joint/Cartilage Minimally displaced fracture of the medial malleolus. Oblique fracture of the distal fibular metaphysis with the distal extent of the fracture at the level of  the syndesmosis. There is approximately 5-6 mm of anterior distraction of the distal fracture component. Minimally displaced fracture at the lateral margin of the posterior malleolus (series 9, image 67 and series 3, image 156). There is mild lateral rotation of the talus relative to the tibial plafond with associated asymmetric narrowing of the posteromedial clear space of the ankle mortise and relative widening laterally. Asymmetric tibiotalar joint space narrowing with the posterolateral tibial plafond abutting the posterior margin of the talar dome which demonstrates cortical irregularity/concavity, which could be secondary to degenerative change, however, a nondisplaced fracture cannot be entirely excluded (series 9, image 64). Marginal tibial plafond osteophytosis. Diffuse osseous demineralization. Well corticated ossicles adjacent to the base of the navicular measuring up to 10 mm may reflect accessory navicular or sequela prior trauma. Os peroneum is noted. Calcaneal enthesopathy at the insertion of the Achilles tendon and the origin of the central cord of the plantar fascia. Mild degenerative changes of the midfoot. Ligaments Ligaments are suboptimally evaluated by CT. Muscles and Tendons Muscles are normal. No muscle atrophy. No intramuscular fluid collection or hematoma. Soft tissue Soft tissue swelling of the ankle, most pronounced medially and anterolaterally. No loculated fluid collection. IMPRESSION: 1. Oblique distracted fracture of the distal fibular metaphysis with the distal extent of the fracture at the level of the syndesmosis (Weber type B). 2. Minimally displaced fracture of the medial malleolus. 3. Minimally displaced fracture at the lateral margin of the posterior malleolus 4. Mild lateral rotation of the talus relative to the tibial plafond with associated asymmetric narrowing of the posteromedial clear space of the ankle mortise and relative widening laterally. 5. Asymmetric tibiotalar  joint space narrowing with the posterolateral tibial plafond abutting the posterior margin of the talar dome which demonstrates cortical irregularity/concavity, which could be secondary to degenerative change, however, a nondisplaced fracture cannot be entirely excluded. Electronically Signed   By: Harrietta Sherry M.D.   On: 03/16/2024 13:11   CT Head Wo Contrast Result Date: 03/16/2024 CLINICAL DATA:  73 year old female status post fall.  Pain. EXAM: CT HEAD WITHOUT CONTRAST TECHNIQUE: Contiguous  axial images were obtained from the base of the skull through the vertex without intravenous contrast. RADIATION DOSE REDUCTION: This exam was performed according to the departmental dose-optimization program which includes automated exposure control, adjustment of the mA and/or kV according to patient size and/or use of iterative reconstruction technique. COMPARISON:  Brain MRI 03/14/2017.  Head CT 05/11/2023. FINDINGS: Brain: Stable cerebral volume. Patchy chronic encephalomalacia from previous right MCA middle or posterior division cortical infarct. No midline shift, ventriculomegaly, mass effect, evidence of mass lesion, intracranial hemorrhage or evidence of cortically based acute infarction. Small chronic left cerebellar infarct also stable. Gray-white differentiation elsewhere is within normal limits for age. Vascular: Calcified atherosclerosis at the skull base. No suspicious intracranial vascular hyperdensity. Skull: Stable and intact. Sinuses/Orbits: Visualized paranasal sinuses and mastoids are stable and well aerated. Other: No acute orbit or scalp soft tissue injury identified. IMPRESSION: 1. No acute traumatic injury identified. 2. 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 Electronically Signed   By: VEAR Hurst M.D.   On: 03/16/2024 12:41   DG Hip Unilat W or Wo Pelvis 2-3 Views Left Result Date: 03/16/2024 CLINICAL DATA:  73 year old  female status post fall.  Pain. EXAM: DG HIP (WITH OR WITHOUT PELVIS) 2-3V LEFT COMPARISON:  CT Abdomen and Pelvis yesterday. FINDINGS: Three views at 1058 hours. Excreted IV contrast from the CT yesterday in nondistended urinary bladder. Bone mineralization is within normal limits for age. Calcified iliofemoral atherosclerosis. Femoral heads normally located. No pelvis fracture identified. Proximal left femur appears intact. Nonobstructed bowel-gas pattern. IMPRESSION: 1. No acute fracture or dislocation identified about the left hip or pelvis. 2. Excreted IV contrast in the urinary bladder from CT yesterday. Electronically Signed   By: VEAR Hurst M.D.   On: 03/16/2024 11:11   DG Ankle Complete Left Result Date: 03/16/2024 CLINICAL DATA:  Pain after fall EXAM: LEFT ANKLE COMPLETE - 3 VIEW COMPARISON:  None Available. FINDINGS: Severe osteopenia. Soft tissue swelling about the ankle. There is a minimally displaced fracture along the distal tibia at the base of the medial malleolus. Is also a distal fibular metaphyseal mildly displaced oblique fracture. No dislocation. Slight degenerative changes. Calcaneal plantar spur. There are some calcifications along the course of the distal Achilles tendon as well. Please correlate for symptoms tendinosis. Scattered vascular calcifications. IMPRESSION: Mildly displaced fractures of the base of the medial malleolus and the distal fibular metaphysis. Soft tissue swelling. Osteopenia and chronic changes as well. Electronically Signed   By: Ranell Bring M.D.   On: 03/16/2024 11:10   CT ABDOMEN PELVIS W CONTRAST Result Date: 03/15/2024 CLINICAL DATA:  Acute nonlocalized abdominal pain. EXAM: CT ABDOMEN AND PELVIS WITH CONTRAST TECHNIQUE: Multidetector CT imaging of the abdomen and pelvis was performed using the standard protocol following bolus administration of intravenous contrast. RADIATION DOSE REDUCTION: This exam was performed according to the departmental dose-optimization  program which includes automated exposure control, adjustment of the mA and/or kV according to patient size and/or use of iterative reconstruction technique. CONTRAST:  75mL OMNIPAQUE  IOHEXOL  350 MG/ML SOLN COMPARISON:  CT abdomen pelvis 04/03/2023 FINDINGS: Lower chest: No acute abnormality. Hepatobiliary: Cholecystectomy. Pneumobilia. Liver is unremarkable. Pancreas: Unremarkable. Spleen: Unremarkable. Adrenals/Urinary Tract: Normal adrenal glands. No urinary calculi or hydronephrosis. Unremarkable bladder. Stomach/Bowel: Normal caliber large and small bowel. Stomach is within normal limits. Colonic diverticulosis without diverticulitis. Vascular/Lymphatic: No significant vascular findings are present. No enlarged abdominal or pelvic lymph nodes. Reproductive: Hysterectomy.  No adnexal mass. Other: No free intraperitoneal fluid or air. Musculoskeletal: No acute fracture. IMPRESSION: 1. No acute abnormality in the abdomen or pelvis. 2. Colonic diverticulosis without diverticulitis. Electronically Signed   By: Norman Gatlin M.D.   On: 03/15/2024 23:07     Procedures   Medications Ordered in the ED  oxyCODONE -acetaminophen  (PERCOCET/ROXICET) 5-325 MG per tablet 1 tablet (has no administration in time range)  fentaNYL  (SUBLIMAZE ) injection 50 mcg (50 mcg Intravenous Given 03/16/24 1205)                                    Medical Decision Making Amount and/or Complexity of Data Reviewed Radiology: ordered.  Risk Prescription drug management.  Patient with ground-level fall with left ankle and hip pain.  Differential includes fracture, dislocation, soft tissue injury.   Labs obtained from ED encounter overnight that were unremarkable.  No indication for repeat labs.  X-rays of the left hip and ankle obtained, viewed interpreted independently, evidence of bimalleolar fracture with possible joint extension.  Case discussed with PA Ozell Kenner with orthopedics, will need nonemergent surgery,  recommends CT for surgical planning as well as short leg splint with stirrup.  Patient must be strictly nonweightbearing.  Discussed this at length with the patient and husband.  I do not think patient will feasibly and safely be able to use crutches without risking a subsequent fall or weightbearing on the left ankle.  Home is not suitable for wheelchair use.  Transitions of care consult placed to assist with placement versus home health solutions.  CT head without acute intracranial abnormality.  CT of the ankle shows that it is actually a trimalleolar fracture with some extension into the joint space and narrowing of the ankle mortise.  Discussed with Ortho still plan for surgery sometime next week.  Physical therapy consult placed to aid in placement, social work working on options for patient and discussing with patient husband at bedside.  Patient will remain TOC border until safe disposition can be made.      Final diagnoses:  Closed trimalleolar fracture of left ankle, initial encounter    ED Discharge Orders     None          Alva Larraine FALCON, PA-C 03/16/24 1552    Bari Roxie HERO, DO 03/17/24 [phone]

## 2024-03-16 NOTE — ED Notes (Signed)
 Pt assisted on bedpan.

## 2024-03-16 NOTE — ED Triage Notes (Signed)
 Pt to ED from home with CC of left hip, bilateral ankle pain, and bilateral shoulder pain s/p GLF at home. Pt missed a step going from home to garage and landed on left side. AOx4

## 2024-03-16 NOTE — ED Provider Notes (Signed)
 [place] EMERGENCY DEPARTMENT AT [facility] Provider Note   CSN: [id number] Arrival date & time: 03/15/24  1847     Patient presents with: Abdominal Pain   Rachel Black is a 73 y.o. female.   The history is provided by the patient.  Patient w/history of diabetes, hypertension presents for abdominal pain and whole body pain. Patient reports generalized abdominal pain and pain on her right side.  No fevers or vomiting.  No chest pain or shortness of breath.  No urinary symptoms.  She used to get the symptoms last year, but had improved recently.   Past Medical History:  Diagnosis Date   Arthritis    PAIN AND OA BOTH KNEES AND SHOULDERS AND ELBOWS AND WRIST   Blood transfusion without reported diagnosis 2018   after cholecystectomy   Broken foot, right, closed, initial encounter 09/15/2021   no surgery needed, fell at home and went to ED   Diabetes mellitus    ORAL MEDICATION   Diverticulitis    GERD (gastroesophageal reflux disease)    Gout    NO RECENT FLARE UPS   H/O: rheumatic fever    AS A CHILD - NO KNOWN HEART MURMUR OR HEART PROBLEMS   Heart attack (HCC)    Hyperlipidemia    Hypertension    PFO (patent foramen ovale)    Stroke (HCC) 03/2017    Prior to Admission medications   Medication Sig Start Date End Date Taking? Authorizing Provider  ACCU-CHEK AVIVA PLUS test strip USE AS INSTRUCTED ONCE A DAY. DX CODE E11.9 07/20/22   Abonza, Maritza, PA-C  Accu-Chek Softclix Lancets lancets Use as instructed once a day.  DX Code: E11.9 03/24/22   Abonza, Maritza, PA-C  acetaminophen  (TYLENOL ) 500 MG tablet Take 1,000 mg by mouth every 6 (six) hours as needed for mild pain or moderate pain.    [provider]  allopurinol  (ZYLOPRIM ) 300 MG tablet TAKE 1 TABLET BY MOUTH 2 TIMES DAILY. 12/01/23   Chandra Toribio [name], MD  CALCIUM  PO Take 2 tablets by mouth daily.    [provider]  dicyclomine  (BENTYL ) 20 MG tablet TAKE 1 TABLET (20 MG TOTAL) BY MOUTH 2  (TWO) TIMES DAILY AS NEEDED FOR SPASMS (ABDOMINAL CRAMPING). 06/09/23   Wallace Joesph LABOR, PA  DULoxetine  (CYMBALTA ) 60 MG capsule Take 1 capsule (60 mg total) by mouth daily. 05/12/23   Wallace Joesph A, PA  Emollient (UDDERLY SMOOTH) CREA Apply 1 application topically See admin instructions. Apply topically to skin folds as needed for rash/irritation    [provider]  famotidine  (PEPCID ) 10 MG tablet Take 1 tablet (10 mg total) by mouth 2 (two) times daily for 5 days. And then afterwards take as needed for heartburn 04/26/23 05/01/23  Mesner, Selinda, MD  fenofibrate  160 MG tablet Take 1 tablet (160 mg total) by mouth daily. 05/12/23   Wallace Joesph LABOR, PA  ferrous sulfate  325 (65 FE) MG tablet TAKE 1 TABLET BY MOUTH TWICE A DAY 08/16/23   Abran Norleen SAILOR, MD  gabapentin  (NEURONTIN ) 300 MG capsule TAKE 1 CAPSULE BY MOUTH EVERYDAY AT BEDTIME 12/30/23   Chandra Toribio [name], MD  isosorbide  mononitrate (IMDUR ) 30 MG 24 hr tablet TAKE 1 TABLET BY MOUTH EVERY DAY 08/31/23   Daneen Damien BROCKS, NP  olmesartan  (BENICAR ) 5 MG tablet Take 1 tablet (5 mg total) by mouth daily. 05/12/23   Wallace Joesph LABOR, PA  ondansetron  (ZOFRAN -ODT) 4 MG disintegrating tablet Take 1 tablet (4 mg  total) by mouth every 8 (eight) hours as needed for nausea or vomiting. 04/03/23   Yolande Lamar BROCKS, MD  pantoprazole  (PROTONIX ) 40 MG tablet TAKE 1 TABLET BY MOUTH EVERY DAY 02/18/24   Abran Norleen SAILOR, MD  polyethylene glycol (MIRALAX  / GLYCOLAX ) 17 g packet Take 17 g by mouth daily as needed for moderate constipation.    [provider]  rosuvastatin  (CRESTOR ) 40 MG tablet TAKE 1 TABLET BY MOUTH EVERY DAY 07/28/23   Lavona Agent, MD  sucralfate  (CARAFATE ) 1 GM/10ML suspension Take 10 mLs (1 g total) by mouth 4 (four) times daily -  with meals and at bedtime. Patient not taking: Reported on 08/17/2023 04/26/23   Lorette Mayo, MD    Allergies: Other and Penicillins    Review of Systems  Constitutional:  Negative for fever.   Gastrointestinal:  Positive for abdominal pain. Negative for vomiting.  Genitourinary:  Negative for dysuria.    Updated Vital Signs BP 134/86 (BP Location: Right Arm)   Pulse 85   Temp 98.2 F (36.8 C) (Oral)   Resp 16   SpO2 100%   Physical Exam CONSTITUTIONAL: Well developed/well nourished, she is in no distress watching television HEAD: Normocephalic/atraumatic ENMT: Mucous membranes moist NECK: supple no meningeal signs CV: S1/S2 noted, no murmurs/rubs/gallops noted LUNGS: Lungs are clear to auscultation bilaterally, no apparent distress ABDOMEN: soft, nontender, no rebound or guarding, bowel sounds noted throughout abdomen GU:no cva tenderness NEURO: Pt is awake/alert/appropriate, moves all extremitiesx4.  No facial droop.   EXTREMITIES: pulses normal/equal, full ROM, no deformities SKIN: warm, color normal PSYCH: no abnormalities of mood noted, alert and oriented to situation  (all labs ordered are listed, but only abnormal results are displayed) Labs Reviewed  CBC WITH DIFFERENTIAL/PLATELET - Abnormal; Notable for the following components:      Result Value   RBC 3.75 (*)    MCV 101.3 (*)    All other components within normal limits  COMPREHENSIVE METABOLIC PANEL WITH GFR - Abnormal; Notable for the following components:   Sodium 134 (*)    CO2 19 (*)    Glucose, Bld 104 (*)    Creatinine, Ser 1.31 (*)    Total Protein 6.4 (*)    GFR, Estimated 44 (*)    All other components within normal limits  LIPASE, BLOOD    EKG: None  Radiology: CT ABDOMEN PELVIS W CONTRAST Result Date: 03/15/2024 CLINICAL DATA:  Acute nonlocalized abdominal pain. EXAM: CT ABDOMEN AND PELVIS WITH CONTRAST TECHNIQUE: Multidetector CT imaging of the abdomen and pelvis was performed using the standard protocol following bolus administration of intravenous contrast. RADIATION DOSE REDUCTION: This exam was performed according to the departmental dose-optimization program which includes  automated exposure control, adjustment of the mA and/or kV according to patient size and/or use of iterative reconstruction technique. CONTRAST:  75mL OMNIPAQUE  IOHEXOL  350 MG/ML SOLN COMPARISON:  CT abdomen pelvis 04/03/2023 FINDINGS: Lower chest: No acute abnormality. Hepatobiliary: Cholecystectomy. Pneumobilia. Liver is unremarkable. Pancreas: Unremarkable. Spleen: Unremarkable. Adrenals/Urinary Tract: Normal adrenal glands. No urinary calculi or hydronephrosis. Unremarkable bladder. Stomach/Bowel: Normal caliber large and small bowel. Stomach is within normal limits. Colonic diverticulosis without diverticulitis. Vascular/Lymphatic: No significant vascular findings are present. No enlarged abdominal or pelvic lymph nodes. Reproductive: Hysterectomy.  No adnexal mass. Other: No free intraperitoneal fluid or air. Musculoskeletal: No acute fracture. IMPRESSION: 1. No acute abnormality in the abdomen or pelvis. 2. Colonic diverticulosis without diverticulitis. Electronically Signed   By: Norman Gatlin M.D.   On: 03/15/2024  23:07     Procedures   Medications Ordered in the ED  iohexol  (OMNIPAQUE ) 350 MG/ML injection 75 mL (75 mLs Intravenous Contrast Given 03/15/24 2225)                                    Medical Decision Making  This patient presents to the ED for concern of abdominal pain, this involves an extensive number of treatment options, and is a complaint that carries with it a high risk of complications and morbidity.  The differential diagnosis includes but is not limited to cholecystitis, cholelithiasis, pancreatitis, gastritis, peptic ulcer disease, appendicitis, bowel obstruction, bowel perforation, diverticulitis, AAA, ischemic bowel    Comorbidities that complicate the patient evaluation: Patient's presentation is complicated by their history of hypertension  Social Determinants of Health: Patient's multiple ER visits  increases the complexity of managing their  presentation  Additional history obtained: Additional history obtained from spouse  Lab Tests: I Ordered, and personally interpreted labs.  The pertinent results include: Renal insufficiency  Imaging Studies ordered: I ordered imaging studies including CT scan abdomen pelvis  I independently visualized and interpreted imaging which showed no acute findings I agree with the radiologist interpretation  Reevaluation: After the interventions noted above, I reevaluated the patient and found that they have :stayed the same  Complexity of problems addressed: Patient's presentation is most consistent with  acute presentation with potential threat to life or bodily function  Disposition: After consideration of the diagnostic results and the patient's response to treatment,  I feel that the patent would benefit from discharge  .   Patient presents with abdominal pain and whole body pain. Patient was seen last year multiple times for the same issues.  Labs and imaging are negative.  She will be discharged.  F/u with PCP     Final diagnoses:  Whole body pain  Generalized abdominal pain    ED Discharge Orders     None          Midge Golas, MD 03/16/24 [phone]

## 2024-03-16 NOTE — Care Plan (Signed)
 Orthopaedic Surgery Plan of Care Note   -history and imaging reviewed with primary team (ER) -pt has left displaced trimalleolar ankle fx  -PMH includes hypertension, hyperlipidemia, GERD, diabetes (most recent A1c Aug 2024 was 5.4) -ok to discharge if cleared by ER/PT -plan for ORIF outpatient around 1 wk post injury once swelling subsides -please call [phone] to schedule for clinic follow up and surgery discussion   Lillia Mountain, MD Orthopaedic Surgery [facility]

## 2024-03-16 NOTE — Progress Notes (Signed)
 Orthopedic Tech Progress Note Patient Details:  Rachel Black June 01, 1951 [id number]  Well-padded posterior short leg splint placed to LLE. Motion and sensation of her toes remain intact. Ice and elevation were encouraged to help with pain/swelling.  Ortho Devices Type of Ortho Device: Post (short leg) splint, Stirrup splint, Cotton web roll Ortho Device/Splint Location: LLE Ortho Device/Splint Interventions: Ordered, Application, Adjustment   Post Interventions Patient Tolerated: Fair Instructions Provided: Care of device  Danely Bayliss Ronal Brasil 03/16/2024, 2:08 PM

## 2024-03-16 NOTE — Care Management (Signed)
 ED RNCM received hand-off from ED SW  concerning patient not being a candidate for Rehab placement from the ED due to ankle fx. Met with patient and husband at bedside to discuss transitional care planning.  Patient and husband stated, they thought that she would be in the ED until placed in a rehab facility.  Explained that patient will be having surgery on the fx ankle next week and is non-weight bearing.  I explained to them that insurance may not cover Rehab which was previously discussed per ED SW.  Discussed DME to assist with mobility at home along with PT/OT until surgery. Patient and Husband verbalized understanding. Updated ED Nurse Myriam RIGGERS covering patient about that  the patient is awaiting surgery next week and insurance may not cover Rehab Facility until after surgery, and may need ST HH services with DME at home.  Albert Gosling RN, BSN CNOR ED RN Care Manager [phone]

## 2024-03-16 NOTE — Evaluation (Addendum)
 Physical Therapy Evaluation Patient Details Name: Rachel Black MRN: [id number] DOB: 07-21-1951 Today's Date: 03/16/2024  History of Present Illness  Pt is a 73 y.o. F who presents after a fall with CT L ankle showing 1. Oblique distracted fracture of the distal fibular metaphysis 2. Minimally displaced fracture of the medial malleolus 3. Minimally displaced fracture at the lateral margin of the  posterior malleolus 4. Mild lateral rotation of the talus relative to the tibial plafond with associated asymmetric narrowing of the posteromedial clear space of the ankle mortise and relative widening laterally 5. Asymmetric tibiotalar joint space narrowing with the posterolateral tibial plafond abutting the posterior margin of the talar dome which demonstrates cortical irregularity/concavity, which could be secondary to degenerative change, however, a nondisplaced fracture cannot be entirely excluded. PMH includes: arthritis, DM, gout, PFO, CVA, HTN, ERCP, TKA bil., CAD  Clinical Impression  PTA, pt lives with her spouse, is a household ambulator and furniture walks, and requires assist with ADL's. Pt with decreased functional use of bilateral upper extremities, with crepitus and decreased shoulder ROM. Pt requiring min-mod assist (+2 safety) for bed mobility and transfers to standing using RW. Once in standing, pt unable to maintain left lower extremity in nonweightbearing position, so returned to sitting edge of bed. Would recommend pt be at a wheelchair level and receive slide board training, which she would still likely need assist from her spouse. Also, pt spouse reports he does not believe doorways in home would be wheelchair accessible. Patient will benefit from continued inpatient follow up therapy, <3 hours/day for additional transfer training, wheelchair mobility and strengthening.       If plan is discharge home, recommend the following: Two people to help with walking and/or transfers;Two people  to help with bathing/dressing/bathroom   Can travel by private vehicle   No    Equipment Recommendations BSC/3in1;Wheelchair (measurements PT);Wheelchair cushion (measurements PT) (drop arm bedside commode, slide board)  Recommendations for Other Services       Functional Status Assessment Patient has had a recent decline in their functional status and demonstrates the ability to make significant improvements in function in a reasonable and predictable amount of time.     Precautions / Restrictions Precautions Precautions: Fall Restrictions Weight Bearing Restrictions Per Provider Order: Yes LLE Weight Bearing Per Provider Order: Non weight bearing      Mobility  Bed Mobility Overal bed mobility: Needs Assistance Bed Mobility: Supine to Sit, Sit to Supine     Supine to sit: Min assist Sit to supine: Mod assist   General bed mobility comments: Trunk assist to upright. ModA for BLE elevation back into bed    Transfers Overall transfer level: Needs assistance Equipment used: Rolling walker (2 wheels) Transfers: Sit to/from Stand Sit to Stand: Min assist, +2 physical assistance           General transfer comment: MinA + 2 to stand from stretcher. PT foot underneath L foot and pt unable to offload    Ambulation/Gait               General Gait Details: unable  Stairs            Wheelchair Mobility     Tilt Bed    Modified Rankin (Stroke Patients Only)       Balance Overall balance assessment: Needs assistance Sitting-balance support: Feet supported Sitting balance-Leahy Scale: Fair     Standing balance support: Bilateral upper extremity supported Standing balance-Leahy Scale: Poor  Pertinent Vitals/Pain Pain Assessment Pain Assessment: Faces Faces Pain Scale: Hurts a little bit Pain Location: L ankle Pain Descriptors / Indicators: Discomfort Pain Intervention(s): Monitored during session     Home Living Family/patient expects to be discharged to:: Private residence Living Arrangements: Spouse/significant other Available Help at Discharge: Family;Available 24 hours/day Type of Home: House Home Access: Stairs to enter Entrance Stairs-Rails: None Entrance Stairs-Number of Steps: 2   Home Layout: Able to live on main level with bedroom/bathroom Home Equipment: Educational psychologist (4 wheels)      Prior Function Prior Level of Function : Needs assist             Mobility Comments: Furniture walking, denies history of other falls ADLs Comments: Assist with ADL's due to bilateral shoulders     Extremity/Trunk Assessment   Upper Extremity Assessment Upper Extremity Assessment: RUE deficits/detail;LUE deficits/detail RUE Deficits / Details: Arthritic shoulders with crepitus. AAROM shoulder flexion to 80 degrees LUE Deficits / Details: Arthritic shoulders with crepitus. AAROM shoulder flexion to 80 degrees    Lower Extremity Assessment Lower Extremity Assessment: Generalized weakness;LLE deficits/detail LLE Deficits / Details: Splint donned. Able to wiggle toes. Unable to perform SLR    Cervical / Trunk Assessment Cervical / Trunk Assessment: Other exceptions Cervical / Trunk Exceptions: increased body habitus  Communication   Communication Communication: No apparent difficulties    Cognition Arousal: Alert Behavior During Therapy: Flat affect   PT - Cognitive impairments: Awareness, Problem solving, Safety/Judgement                       PT - Cognition Comments: Pt looking over to spouse to answer some questions. Decreased insight into abilities and weightbearing precautions Following commands: Intact       Cueing Cueing Techniques: Verbal cues     General Comments      Exercises     Assessment/Plan    PT Assessment Patient needs continued PT services  PT Problem List Decreased strength;Decreased activity tolerance;Decreased  balance;Decreased mobility;Decreased safety awareness;Pain       PT Treatment Interventions DME instruction;Functional mobility training;Therapeutic exercise;Therapeutic activities;Balance training;Patient/family education;Wheelchair mobility training    PT Goals (Current goals can be found in the Care Plan section)  Acute Rehab PT Goals Patient Stated Goal: to have surgery PT Goal Formulation: With patient/family Time For Goal Achievement: 03/30/24 Potential to Achieve Goals: Fair    Frequency Min 2X/week     Co-evaluation               AM-PAC PT 6 Clicks Mobility  Outcome Measure Help needed turning from your back to your side while in a flat bed without using bedrails?: A Little Help needed moving from lying on your back to sitting on the side of a flat bed without using bedrails?: A Little Help needed moving to and from a bed to a chair (including a wheelchair)?: A Lot Help needed standing up from a chair using your arms (e.g., wheelchair or bedside chair)?: A Little Help needed to walk in hospital room?: Total Help needed climbing 3-5 steps with a railing? : Total 6 Click Score: 13    End of Session Equipment Utilized During Treatment: Gait belt Activity Tolerance: Patient tolerated treatment well Patient left: in bed;with call bell/phone within reach;with family/visitor present   PT Visit Diagnosis: Unsteadiness on feet (R26.81);History of falling (Z91.81);Muscle weakness (generalized) (M62.81);Difficulty in walking, not elsewhere classified (R26.2)    Time: 8499-8474 PT Time Calculation (min) (ACUTE ONLY): 25 min  Charges:   PT Evaluation $PT Eval Low Complexity: 1 Low PT Treatments $Therapeutic Activity: 8-22 mins PT General Charges $$ ACUTE PT VISIT: 1 Visit         Aleck Daring, PT, DPT Acute Rehabilitation Services Office [phone]   Alayne ONEIDA Daring 03/16/2024, 4:08 PM

## 2024-03-16 NOTE — Care Management (Signed)
 RNCM spoke with Patient and husband regarding DME, and they report having a rollator and possibly a bedside commode at home.

## 2024-03-16 NOTE — Progress Notes (Signed)
 CSW spoke with patient and her husband regarding HH vs. SNF. Christopher states he works full time and is unable to care for patient at home. Christopher states the couple has two adult children but neither are available to assist patient. CSW explained insurance limitations for SNF due to patient being planned for non-emergent surgery. Christopher states patient has historically been in disagreement with accepting home health services. Christopher states he wants to discuss further with patient and requests CSW return call to him shortly.  Niels Portugal, MSW, LCSW Transitions of Care  Clinical Social Worker II [phone]

## 2024-03-16 NOTE — ED Notes (Addendum)
 Pt in bed, pt resps are even and unlabored, pt watching tv, pt reports 8/10 leg pain, requests pain med, pt has splint in place, less than three sec cap refill positive sensation to touch.

## 2024-03-17 MED ORDER — PANTOPRAZOLE SODIUM 40 MG PO TBEC
40.0000 mg | DELAYED_RELEASE_TABLET | Freq: Every day | ORAL | Status: DC
  Administered 2024-03-17 – 2024-03-28 (×11): 40 mg via ORAL
  Filled 2024-03-17 (×11): qty 1

## 2024-03-17 MED ORDER — DULOXETINE HCL 60 MG PO CPEP
60.0000 mg | ORAL_CAPSULE | Freq: Every day | ORAL | Status: DC
  Administered 2024-03-17 – 2024-03-28 (×11): 60 mg via ORAL
  Filled 2024-03-17 (×11): qty 1

## 2024-03-17 MED ORDER — IRBESARTAN 75 MG PO TABS
37.5000 mg | ORAL_TABLET | Freq: Every day | ORAL | Status: DC
  Administered 2024-03-17 – 2024-03-18 (×2): 37.5 mg via ORAL
  Filled 2024-03-17 (×3): qty 0.5

## 2024-03-17 MED ORDER — FERROUS SULFATE 325 (65 FE) MG PO TABS
325.0000 mg | ORAL_TABLET | Freq: Two times a day (BID) | ORAL | Status: DC
  Administered 2024-03-17 – 2024-03-28 (×22): 325 mg via ORAL
  Filled 2024-03-17 (×22): qty 1

## 2024-03-17 MED ORDER — ISOSORBIDE MONONITRATE ER 30 MG PO TB24
30.0000 mg | ORAL_TABLET | Freq: Every day | ORAL | Status: DC
  Administered 2024-03-17 – 2024-03-18 (×2): 30 mg via ORAL
  Filled 2024-03-17 (×2): qty 1

## 2024-03-17 MED ORDER — SODIUM CHLORIDE 0.9 % IV BOLUS
1000.0000 mL | Freq: Once | INTRAVENOUS | Status: AC
  Administered 2024-03-17: 1000 mL via INTRAVENOUS

## 2024-03-17 MED ORDER — ROSUVASTATIN CALCIUM 20 MG PO TABS
40.0000 mg | ORAL_TABLET | Freq: Every day | ORAL | Status: DC
  Administered 2024-03-17 – 2024-03-28 (×11): 40 mg via ORAL
  Filled 2024-03-17 (×11): qty 2

## 2024-03-17 MED ORDER — FAMOTIDINE 20 MG PO TABS
10.0000 mg | ORAL_TABLET | Freq: Two times a day (BID) | ORAL | Status: DC
  Administered 2024-03-17 – 2024-03-28 (×22): 10 mg via ORAL
  Filled 2024-03-17 (×22): qty 1

## 2024-03-17 MED ORDER — ALLOPURINOL 300 MG PO TABS
300.0000 mg | ORAL_TABLET | Freq: Two times a day (BID) | ORAL | Status: DC
  Administered 2024-03-17 – 2024-03-28 (×22): 300 mg via ORAL
  Filled 2024-03-17 (×4): qty 1
  Filled 2024-03-17: qty 3
  Filled 2024-03-17 (×2): qty 1
  Filled 2024-03-17: qty 3
  Filled 2024-03-17 (×2): qty 1
  Filled 2024-03-17: qty 3
  Filled 2024-03-17 (×5): qty 1
  Filled 2024-03-17: qty 3
  Filled 2024-03-17 (×3): qty 1
  Filled 2024-03-17: qty 3
  Filled 2024-03-17 (×2): qty 1

## 2024-03-17 MED ORDER — FENOFIBRATE 160 MG PO TABS
160.0000 mg | ORAL_TABLET | Freq: Every day | ORAL | Status: DC
  Administered 2024-03-17 – 2024-03-28 (×11): 160 mg via ORAL
  Filled 2024-03-17 (×11): qty 1

## 2024-03-17 MED ORDER — GABAPENTIN 300 MG PO CAPS
300.0000 mg | ORAL_CAPSULE | Freq: Every day | ORAL | Status: DC
  Administered 2024-03-17 – 2024-03-27 (×11): 300 mg via ORAL
  Filled 2024-03-17 (×11): qty 1

## 2024-03-17 NOTE — ED Notes (Signed)
 Upon rounding for this pt, this pt stated that she is interested in staying another day within the hospital. Per pt, she stated that her knee is hurting and would like to stay just so she would get proper rest to help her heal.

## 2024-03-17 NOTE — ED Provider Notes (Signed)
 Emergency Medicine Observation Re-evaluation Note  Rachel Black is a 73 y.o. female, seen on rounds today.  Pt initially presented to the ED for complaints of Hip Pain, Ankle Pain, and Shoulder Pain Currently, the patient is resting in red.  Physical Exam  BP 125/64 (BP Location: Right Arm)   Pulse 74   Temp 98.2 F (36.8 C) (Oral)   Resp 18   Ht 5' 6 (1.676 m)   Wt 86 kg   SpO2 99%   BMI 30.60 kg/m  Physical Exam General: NAD Cardiac: Regular HR Lungs: No resp distress   ED Course / MDM  EKG:EKG Interpretation Date/Time:  Thursday March 16 2024 10:08:17 EDT Ventricular Rate:  87 PR Interval:  95 QRS Duration:  106 QT Interval:  366 QTC Calculation: 441 R Axis:   227  Text Interpretation: Sinus rhythm Multiform ventricular premature complexes Short PR interval Right atrial enlargement Markedly posterior QRS axis Consider anterior infarct Confirmed by Bari Flank [phone]) on 03/16/2024 11:37:11 AM  I have reviewed the labs performed to date as well as medications administered while in observation.    Plan  Current plan is for [facility] assistance with placement Pt here with trimalleolar ankle fracture Ortho planning for surgery next week Home meds reordered    Cottie Donnice PARAS, MD 03/17/24 [phone]

## 2024-03-17 NOTE — [place] FL2 (Signed)
 [place]  MEDICAID FL2 LEVEL OF CARE FORM     IDENTIFICATION  Patient Name: Rachel Black Birthdate: 10/15/1950 Sex: female Admission Date (Current Location): 03/16/2024  [facility] and [place] Number:  Producer, television/film/video and Address:  [facility] [place], [address] [place], [place] [address]      Provider Number: [id number]  Attending Physician Name and Address:  System, Provider Not In  Relative Name and Phone Number:       Current Level of Care: Hospital Recommended Level of Care: Skilled Nursing Facility Prior Approval Number:    Date Approved/Denied:   PASRR Number: [id number]  Discharge Plan: SNF    Current Diagnoses: Patient Active Problem List   Diagnosis Date Noted   Anxiety 01/31/2023   Acute pulmonary embolism (HCC) 01/14/2023   H/O: CVA (cerebrovascular accident) 01/14/2023   CAD S/P percutaneous coronary angioplasty 01/14/2023   Atopic dermatitis 11/01/2022   Inflammatory polyarthropathy (HCC) 11/01/2022   Palpitations 10/25/2022   Left hip pain 10/04/2022   Irritable bowel syndrome with constipation 03/16/2022   Influenza B 09/16/2021   Cryptogenic stroke (HCC) 01/13/2021   GERD without esophagitis 11/26/2020   Iron deficiency 11/26/2020   Family history of breast cancer in mother 11/27/2019   Cataract of both eyes 11/27/2019   Coronary artery disease involving native coronary artery of native heart without angina pectoris 02/18/2018   NSTEMI (non-ST elevated myocardial infarction) (HCC)    History of rheumatic fever 06/30/2017   PFO (patent foramen ovale)    Cerebrovascular accident (CVA) due to embolism of right middle cerebral artery (HCC) 03/14/2017   Elevated LFTs    Choledocholithiasis with obstruction 12/07/2016   AKI (acute kidney injury) (HCC) 12/07/2016   Osteoarthritis of right knee 08/23/2015   Status post total left knee replacement 08/23/2015   Arthritis of knee, right 04/20/2014   Status post total right  knee replacement 04/20/2014   Flushing reaction 03/10/2012   Type 2 diabetes mellitus with other specified complication (HCC) 02/17/2007   GOUT 02/17/2007   Severe obesity (BMI >= 40) (HCC) 02/17/2007   Mixed hyperlipidemia due to type 2 diabetes mellitus (HCC) 02/17/2007   Hypertension associated with diabetes (HCC) 02/17/2007    Orientation RESPIRATION BLADDER Height & Weight     Self, Time, Situation, Place  Normal Continent Weight: 189 lb 9.5 oz (86 kg) Height:  5' 6 (167.6 cm)  BEHAVIORAL SYMPTOMS/MOOD NEUROLOGICAL BOWEL NUTRITION STATUS      Continent Diet (Regular diet)  AMBULATORY STATUS COMMUNICATION OF NEEDS Skin   Extensive Assist Verbally Normal                       Personal Care Assistance Level of Assistance  Bathing, Dressing, Feeding Bathing Assistance: Maximum assistance Feeding assistance: Limited assistance Dressing Assistance: Maximum assistance     Functional Limitations Info  Sight, Speech, Hearing Sight Info: Adequate Hearing Info: Adequate Speech Info: Adequate    SPECIAL CARE FACTORS FREQUENCY  PT (By licensed PT), OT (By licensed OT)     PT Frequency: 5x weekly OT Frequency: 5x weekly            Contractures Contractures Info: Not present    Additional Factors Info  Code Status, Allergies Code Status Info: Full Code Allergies Info: Bandaids, penicillin           Current Medications (03/17/2024):  This is the current hospital active medication list Current Facility-Administered Medications  Medication Dose Route Frequency Provider Last Rate  Last Admin   oxyCODONE -acetaminophen  (PERCOCET/ROXICET) 5-325 MG per tablet 1 tablet  1 tablet Oral Q6H PRN Alva Larraine FALCON, PA-C   1 tablet at 03/16/24 1540   Current Outpatient Medications  Medication Sig Dispense Refill   ACCU-CHEK AVIVA PLUS test strip USE AS INSTRUCTED ONCE A DAY. DX CODE E11.9 100 strip 1   Accu-Chek Softclix Lancets lancets Use as instructed once a day.  DX Code:  E11.9 100 each 1   acetaminophen  (TYLENOL ) 500 MG tablet Take 1,000 mg by mouth every 6 (six) hours as needed for mild pain or moderate pain.     allopurinol  (ZYLOPRIM ) 300 MG tablet TAKE 1 TABLET BY MOUTH 2 TIMES DAILY. 180 tablet 1   CALCIUM  PO Take 2 tablets by mouth daily.     dicyclomine  (BENTYL ) 20 MG tablet TAKE 1 TABLET (20 MG TOTAL) BY MOUTH 2 (TWO) TIMES DAILY AS NEEDED FOR SPASMS (ABDOMINAL CRAMPING). 20 tablet 1   DULoxetine  (CYMBALTA ) 60 MG capsule Take 1 capsule (60 mg total) by mouth daily. 90 capsule 3   Emollient (UDDERLY SMOOTH) CREA Apply 1 application topically See admin instructions. Apply topically to skin folds as needed for rash/irritation     famotidine  (PEPCID ) 10 MG tablet Take 1 tablet (10 mg total) by mouth 2 (two) times daily for 5 days. And then afterwards take as needed for heartburn 30 tablet 0   fenofibrate  160 MG tablet Take 1 tablet (160 mg total) by mouth daily. 90 tablet 3   ferrous sulfate  325 (65 FE) MG tablet TAKE 1 TABLET BY MOUTH TWICE A DAY 180 tablet 3   gabapentin  (NEURONTIN ) 300 MG capsule TAKE 1 CAPSULE BY MOUTH EVERYDAY AT BEDTIME 30 capsule 1   isosorbide  mononitrate (IMDUR ) 30 MG 24 hr tablet TAKE 1 TABLET BY MOUTH EVERY DAY 90 tablet 3   olmesartan  (BENICAR ) 5 MG tablet Take 1 tablet (5 mg total) by mouth daily. 90 tablet 3   ondansetron  (ZOFRAN -ODT) 4 MG disintegrating tablet Take 1 tablet (4 mg total) by mouth every 8 (eight) hours as needed for nausea or vomiting. 12 tablet 0   pantoprazole  (PROTONIX ) 40 MG tablet TAKE 1 TABLET BY MOUTH EVERY DAY 90 tablet 1   polyethylene glycol (MIRALAX  / GLYCOLAX ) 17 g packet Take 17 g by mouth daily as needed for moderate constipation.     rosuvastatin  (CRESTOR ) 40 MG tablet TAKE 1 TABLET BY MOUTH EVERY DAY 90 tablet 2   sucralfate  (CARAFATE ) 1 GM/10ML suspension Take 10 mLs (1 g total) by mouth 4 (four) times daily -  with meals and at bedtime. (Patient not taking: Reported on 08/17/2023) 420 mL 0      Discharge Medications: Please see discharge summary for a list of discharge medications.  Relevant Imaging Results:  Relevant Lab Results:   Additional Information SSN: [id number]  Niels LITTIE Portugal, LCSW

## 2024-03-17 NOTE — Evaluation (Signed)
 Occupational Therapy Evaluation Patient Details Name: Rachel Black MRN: [id number] DOB: September 26, 1950 Today's Date: 03/17/2024   History of Present Illness   Pt is a 73 y.o. F who presents after a fall with CT L ankle showing 1. Oblique distracted fracture of the distal fibular metaphysis 2. Minimally displaced fracture of the medial malleolus 3. Minimally displaced fracture at the lateral margin of the  posterior malleolus 4. Mild lateral rotation of the talus relative to the tibial plafond with associated asymmetric narrowing of the posteromedial clear space of the ankle mortise and relative widening laterally 5. Asymmetric tibiotalar joint space narrowing with the posterolateral tibial plafond abutting the posterior margin of the talar dome which demonstrates cortical irregularity/concavity, which could be secondary to degenerative change, however, a nondisplaced fracture cannot be entirely excluded. PMH includes: arthritis, DM, gout, PFO, CVA, HTN, ERCP, TKA bil., CAD     Clinical Impressions Pt currently at max +2 for simulated selfcare sit to stand with total assist for simulated squat pivot transfer to a drop arm commode.  Pt currently not able to maintain NWBing status over the LLE with completion of sit to stand and simulated transfers.  Prior to admission she was overall modified independent for mobility and use a RW or furniture walker per her report.  She was modified independent to supervision for toileting tasks also.  Feel she will benefit from acute care OT at this time in order to increase overall ADL independence and reduce burden of care.  Recommend patient will benefit from continued inpatient follow up therapy, <3 hours/day post acute stay as patient does not have 24 hour supervision.  HR increased from 99 BPM up to 142.  BP at 110/52 supine with HOB elevated at 30 degrees.  It increased up to 124/74 in sitting.  Pt with some nausea and spitting up.  Nursing made aware.        If  plan is discharge home, recommend the following:   A lot of help with walking and/or transfers;A lot of help with bathing/dressing/bathroom;Assistance with cooking/housework;Assist for transportation;Help with stairs or ramp for entrance     Functional Status Assessment   Patient has had a recent decline in their functional status and demonstrates the ability to make significant improvements in function in a reasonable and predictable amount of time.     Equipment Recommendations   Other (comment) (TBD next venue of care)      Precautions/Restrictions   Precautions Precautions: Fall Restrictions Weight Bearing Restrictions Per Provider Order: Yes LLE Weight Bearing Per Provider Order: Non weight bearing     Mobility Bed Mobility Overal bed mobility: Needs Assistance Bed Mobility: Supine to Sit, Sit to Supine     Supine to sit: Min assist Sit to supine: Mod assist   General bed mobility comments: Assist for bringing trunk up to sitting with assist for lifting her LEs back in the bed when transitioning to supine.    Transfers Overall transfer level: Needs assistance Equipment used: Rolling walker (2 wheels) Transfers: Sit to/from Stand Sit to Stand: Mod assist, +2 physical assistance (from elevated EOB)           General transfer comment: Pt unable to maintain NWBIng over the LLE with sit to stand and use of the RW for support.  Completed scoots up the EOB with mod assist from therapist.      Balance Overall balance assessment: Needs assistance Sitting-balance support: Feet supported Sitting balance-Leahy Scale: Fair     Standing balance  support: Bilateral upper extremity supported, Reliant on assistive device for balance Standing balance-Leahy Scale: Zero Standing balance comment: Needs BUE support and therapist assist to maintain NWBing on the LLE                           ADL either performed or assessed with clinical judgement   ADL  Overall ADL's : Needs assistance/impaired Eating/Feeding: Set up;Sitting   Grooming: Wash/dry face;Set up   Upper Body Bathing: Minimal assistance;Sitting   Lower Body Bathing: Moderate assistance;+2 for safety/equipment;Sit to/from stand   Upper Body Dressing : Minimal assistance;Sitting   Lower Body Dressing: Maximal assistance;Sit to/from stand;+2 for physical assistance   Toilet Transfer: Total assistance;Squat-pivot;BSC/3in1 Toilet Transfer Details (indicate cue type and reason): simulated Toileting- Clothing Manipulation and Hygiene: Maximal assistance;+2 for safety/equipment;Sit to/from stand       Functional mobility during ADLs: Maximal assistance General ADL Comments: Pt with baseline HR at 99 BPM with O2 at 99% on room air.  BP supine with HOB elevated approximately 30 degrees 110/52.  HR increased up to 144 BPM with sit to stand attempt and scooting up the [facility].  Decreased ability to maintain NWBing over the LLE with sit to stand or standing with integration of the RW.  Unable to take any hops on the RLE.     Vision Baseline Vision/History: 0 No visual deficits Ability to See in Adequate Light: 0 Adequate Patient Visual Report: No change from baseline       Perception Perception: Not tested       Praxis Praxis: Not tested       Pertinent Vitals/Pain Pain Assessment Pain Assessment: Faces Faces Pain Scale: Hurts little more Pain Location: L ankle Pain Descriptors / Indicators: Discomfort, Grimacing Pain Intervention(s): Limited activity within patient's tolerance, Repositioned     Extremity/Trunk Assessment Upper Extremity Assessment Upper Extremity Assessment: RUE deficits/detail;LUE deficits/detail RUE Deficits / Details: Arthritic shoulders with crepitus. AAROM shoulder flexion to 80 degrees.AROM elbow flexion/extension and gross grasp and release WFLs with strength 3+/5. RUE: Shoulder pain with ROM RUE Coordination: decreased gross motor LUE Deficits /  Details: Arthritic shoulders with crepitus. AAROM shoulder flexion to 80 degrees.  AROM elbow flexion/extension and gross grasp and release WFLs with strength 3+/5. LUE: Shoulder pain with ROM LUE Coordination: decreased gross motor   Lower Extremity Assessment Lower Extremity Assessment: Defer to PT evaluation   Cervical / Trunk Assessment Cervical / Trunk Assessment: Other exceptions Cervical / Trunk Exceptions: increased body habitus   Communication Communication Communication: No apparent difficulties   Cognition Arousal: Alert Behavior During Therapy: Flat affect               OT - Cognition Comments: Pt oriented to place and situation.                 Following commands: Intact       Cueing  General Comments   Cueing Techniques: Tactile cues;Verbal cues              Home Living Family/patient expects to be discharged to:: Private residence Living Arrangements: Spouse/significant other Available Help at Discharge: Family;Available 24 hours/day Type of Home: House Home Access: Stairs to enter [employer] of Steps: 2 Entrance Stairs-Rails: None Home Layout: Able to live on main level with bedroom/bathroom     Bathroom Shower/Tub: Producer, television/film/video: Standard Bathroom Accessibility: Yes   Home Equipment: Educational psychologist (4 wheels)  Prior Functioning/Environment Prior Level of Function : Needs assist             Mobility Comments: Furniture walking, denies history of other falls ADLs Comments: Assist with ADL's due to bilateral shoulders    OT Problem List: Decreased strength;Decreased knowledge of use of DME or AE;Decreased activity tolerance;Impaired balance (sitting and/or standing);Pain   OT Treatment/Interventions: Self-care/ADL training;Balance training;Therapeutic exercise;Therapeutic activities;DME and/or AE instruction;Patient/family education      OT Goals(Current goals can be found  in the care plan section)   Acute Rehab OT Goals Patient Stated Goal: Pt did not state but agreeable to working with therapy OT Goal Formulation: With patient/family Time For Goal Achievement: 03/31/24 Potential to Achieve Goals: Fair   OT Frequency:  Min 1X/week       AM-PAC OT 6 Clicks Daily Activity     Outcome Measure Help from another person eating meals?: None Help from another person taking care of personal grooming?: A Little Help from another person toileting, which includes using toliet, bedpan, or urinal?: Total Help from another person bathing (including washing, rinsing, drying)?: Total Help from another person to put on and taking off regular upper body clothing?: A Little Help from another person to put on and taking off regular lower body clothing?: Total 6 Click Score: 13   End of Session Equipment Utilized During Treatment: Gait belt;Rolling walker (2 wheels) Nurse Communication: Mobility status  Activity Tolerance: Patient tolerated treatment well Patient left: with call bell/phone within reach;in bed;with family/visitor present  OT Visit Diagnosis: Unsteadiness on feet (R26.81);Other abnormalities of gait and mobility (R26.89);Muscle weakness (generalized) (M62.81);Pain Pain - Right/Left: Left Pain - part of body: Ankle and joints of foot                Time: 1415-1452 OT Time Calculation (min): 37 min Charges:  OT General Charges $OT Visit: 1 Visit OT Evaluation $OT Eval Moderate Complexity: 1 Mod OT Treatments $Self Care/Home Management : 8-22 mins  Lynwood Constant, OTR/L Acute Rehabilitation Services  Office [phone] 03/17/2024

## 2024-03-17 NOTE — Progress Notes (Addendum)
 10:50am: Per CMA, BCBS is closed today and authorization is unable to be started.  Patient has Medicare A but does not have a qualifying stay to use those benefits and does not have the [facility] ACO REACH waiver.  CSW informed MD that patient will have to board until insurance authorization can be initiated on Monday.  10:30am: CSW spoke with patient and [name] to present them with bed offers. Patient has accepted bed offer from [facility].  CSW will initiate insurance authorization.  8:20am: CSW completed FL2 and faxed patient's clinicals out to obtain bed offers.  Niels Portugal, MSW, LCSW Transitions of Care  Clinical Social Worker II [phone]

## 2024-03-17 NOTE — ED Notes (Addendum)
 Placed purewick on pt.

## 2024-03-18 MED ORDER — POLYETHYLENE GLYCOL 3350 17 G PO PACK
17.0000 g | PACK | Freq: Every day | ORAL | Status: DC
  Administered 2024-03-18 – 2024-03-24 (×6): 17 g via ORAL
  Filled 2024-03-18 (×9): qty 1

## 2024-03-18 NOTE — ED Notes (Signed)
 PT ASSISTED TO THE BS COMMODE, PT REFUSES TO USE HER LEGS TO HOLD HERSELF UP. PT WAS ADVISED THAT THIS WAS NOT SAFE AND THAT FUTURE TOILETING WOULD BE BY BEDPAN

## 2024-03-18 NOTE — Consult Note (Signed)
 Patient ID: Rachel Black MRN: [id number] DOB/AGE: 11-23-1950 73 y.o.  Admit date: 03/16/2024  Admission Diagnoses:  Left closed trimalleolar ankle fracture with displacement  HPI: Ortho consult for above injury sustained on 03/16/24 when she fell walking into her house.   PMH notable for hypertension, hyperlipidemia, GERD, diabetes.  Denies numbness/tingling.  Past Medical History: Past Medical History:  Diagnosis Date   Arthritis    PAIN AND OA BOTH KNEES AND SHOULDERS AND ELBOWS AND WRIST   Blood transfusion without reported diagnosis 2018   after cholecystectomy   Broken foot, right, closed, initial encounter 09/15/2021   no surgery needed, fell at home and went to ED   Diabetes mellitus    ORAL MEDICATION   Diverticulitis    GERD (gastroesophageal reflux disease)    Gout    NO RECENT FLARE UPS   H/O: rheumatic fever    AS A CHILD - NO KNOWN HEART MURMUR OR HEART PROBLEMS   Heart attack (HCC)    Hyperlipidemia    Hypertension    PFO (patent foramen ovale)    Stroke (HCC) 03/2017    Surgical History: Past Surgical History:  Procedure Laterality Date   CESAREAN SECTION     CHOLECYSTECTOMY N/A 12/09/2016   Procedure: LAPAROSCOPIC CHOLECYSTECTOMY WITH INTRAOPERATIVE CHOLANGIOGRAM;  Surgeon: Jina Nephew, MD;  Location: [facility];  Service: General;  Laterality: N/A;   COLONOSCOPY     COLONOSCOPY WITH PROPOFOL  N/A 01/30/2013   Procedure: COLONOSCOPY WITH PROPOFOL ;  Surgeon: Norleen LOISE Kiang, MD;  Location: [facility];  Service: Endoscopy;  Laterality: N/A;   ERCP N/A 12/08/2016   Procedure: ENDOSCOPIC RETROGRADE CHOLANGIOPANCREATOGRAPHY (ERCP);  Surgeon: Gwendlyn ONEIDA Buddy, MD;  Location: [facility];  Service: Endoscopy;  Laterality: N/A;   ESOPHAGOGASTRODUODENOSCOPY (EGD) WITH PROPOFOL  N/A 01/30/2013   Procedure: ESOPHAGOGASTRODUODENOSCOPY (EGD) WITH PROPOFOL ;  Surgeon: Norleen LOISE Kiang, MD;  Location: [facility];  Service: Endoscopy;  Laterality: N/A;   LEFT HEART  CATH AND CORONARY ANGIOGRAPHY N/A 10/18/2017   Procedure: LEFT HEART CATH AND CORONARY ANGIOGRAPHY;  Surgeon: [place], Peter M, MD;  Location: [facility] IN[facility]AS[facility] CV LAB;  Service: Cardiovascular;  Laterality: N/A;   LOOP RECORDER INSERTION N/A 03/18/2017   Procedure: Loop Recorder Insertion;  Surgeon: Inocencio Soyla Lunger, MD;  Location: [facility]VASIVE CV LAB;  Service: Cardiovascular;  Laterality: N/A;   PARTIAL HYSTERECTOMY     TEE WITHOUT CARDIOVERSION N/A 03/16/2017   Procedure: TRANSESOPHAGEAL ECHOCARDIOGRAM (TEE);  Surgeon: Alveta Aleene PARAS, MD;  Location: [facility]OSCOPY;  Service: Cardiovascular;  Laterality: N/A;   TOTAL ABDOMINAL HYSTERECTOMY     TOTAL KNEE ARTHROPLASTY Right 04/20/2014   Procedure: RIGHT TOTAL KNEE ARTHROPLASTY CONVERTED TO RIGHT KNEE REIMPLANTATION;  Surgeon: Lonni CINDERELLA Poli, MD;  Location: [facility];  Service: Orthopedics;  Laterality: Right;   TOTAL KNEE ARTHROPLASTY Left 08/23/2015   Procedure: LEFT TOTAL KNEE ARTHROPLASTY;  Surgeon: Lonni CINDERELLA Poli, MD;  Location: [facility];  Service: Orthopedics;  Laterality: Left;   VENTRAL HERNIA REPAIR     x2    Family History: Family History  Problem Relation Age of Onset   Diabetes Mother    Hypertension Mother        entire family   Breast cancer Mother        diagnosed in her 44's   Stroke Father        CVA   Hyperlipidemia Father        entire family   Diabetes Father    Heart disease Father  No details.  Not at an early age.     Crohn's disease Son    Irritable bowel syndrome Son    Colon cancer Neg Hx    Colon polyps Neg Hx    Esophageal cancer Neg Hx    Stomach cancer Neg Hx    Rectal cancer Neg Hx     Social History: Social History   Socioeconomic History   Marital status: Married    Spouse name: Christopher   Number of children: 2   Years of education: Not on file   Highest education level: Not on file  Occupational History   Occupation: housewife    Employer: UNEMPLOYED  Tobacco Use    Smoking status: Never    Passive exposure: Never   Smokeless tobacco: Never  Vaping Use   Vaping status: Never Used  Substance and Sexual Activity   Alcohol use: Not Currently    Comment: rarely in the past   Drug use: Never   Sexual activity: Yes    Partners: Male  Other Topics Concern   Not on file  Social History Narrative   No exercise secondary to knee pain   Social Drivers of Health   Financial Resource Strain: Not on file  Food Insecurity: No Food Insecurity (01/27/2023)   Hunger Vital Sign    Worried About Running Out of Food in the Last Year: Never true    Ran Out of Food in the Last Year: Never true  Transportation Needs: No Transportation Needs (01/27/2023)   PRAPARE - Administrator, Civil Service (Medical): No    Lack of Transportation (Non-Medical): No  Physical Activity: Not on file  Stress: Not on file  Social Connections: Not on file  Intimate Partner Violence: Not At Risk (01/27/2023)   Humiliation, Afraid, Rape, and Kick questionnaire    Fear of Current or Ex-Partner: No    Emotionally Abused: No    Physically Abused: No    Sexually Abused: No    Allergies: Other and Penicillins  Medications: I have reviewed the patient's current medications.  Vital Signs: Patient Vitals for the past 24 hrs:  BP Temp Pulse Resp SpO2  03/18/24 1000 121/72 -- 72 17 100 %  03/18/24 0800 -- 98.5 F (36.9 C) 74 16 100 %    Radiology: CT Ankle Left Wo Contrast Result Date: 03/16/2024 CLINICAL DATA:  Injury after fall. EXAM: CT OF THE LEFT ANKLE WITHOUT CONTRAST TECHNIQUE: Multidetector CT imaging of the left ankle was performed according to the standard protocol. Multiplanar CT image reconstructions were also generated. RADIATION DOSE REDUCTION: This exam was performed according to the departmental dose-optimization program which includes automated exposure control, adjustment of the mA and/or kV according to patient size and/or use of iterative reconstruction  technique. COMPARISON:  Same day left ankle radiographs dated 03/16/2024. FINDINGS: Bones/Joint/Cartilage Minimally displaced fracture of the medial malleolus. Oblique fracture of the distal fibular metaphysis with the distal extent of the fracture at the level of the syndesmosis. There is approximately 5-6 mm of anterior distraction of the distal fracture component. Minimally displaced fracture at the lateral margin of the posterior malleolus (series 9, image 67 and series 3, image 156). There is mild lateral rotation of the talus relative to the tibial plafond with associated asymmetric narrowing of the posteromedial clear space of the ankle mortise and relative widening laterally. Asymmetric tibiotalar joint space narrowing with the posterolateral tibial plafond abutting the posterior margin of the talar dome which demonstrates cortical irregularity/concavity, which  could be secondary to degenerative change, however, a nondisplaced fracture cannot be entirely excluded (series 9, image 64). Marginal tibial plafond osteophytosis. Diffuse osseous demineralization. Well corticated ossicles adjacent to the base of the navicular measuring up to 10 mm may reflect accessory navicular or sequela prior trauma. Os peroneum is noted. Calcaneal enthesopathy at the insertion of the Achilles tendon and the origin of the central cord of the plantar fascia. Mild degenerative changes of the midfoot. Ligaments Ligaments are suboptimally evaluated by CT. Muscles and Tendons Muscles are normal. No muscle atrophy. No intramuscular fluid collection or hematoma. Soft tissue Soft tissue swelling of the ankle, most pronounced medially and anterolaterally. No loculated fluid collection. IMPRESSION: 1. Oblique distracted fracture of the distal fibular metaphysis with the distal extent of the fracture at the level of the syndesmosis (Weber type B). 2. Minimally displaced fracture of the medial malleolus. 3. Minimally displaced fracture at  the lateral margin of the posterior malleolus 4. Mild lateral rotation of the talus relative to the tibial plafond with associated asymmetric narrowing of the posteromedial clear space of the ankle mortise and relative widening laterally. 5. Asymmetric tibiotalar joint space narrowing with the posterolateral tibial plafond abutting the posterior margin of the talar dome which demonstrates cortical irregularity/concavity, which could be secondary to degenerative change, however, a nondisplaced fracture cannot be entirely excluded. Electronically Signed   By: Harrietta Sherry M.D.   On: 03/16/2024 13:11   CT Head Wo Contrast Result Date: 03/16/2024 CLINICAL DATA:  73 year old female status post fall.  Pain. EXAM: CT HEAD WITHOUT CONTRAST TECHNIQUE: Contiguous axial images were obtained from the base of the skull through the vertex without intravenous contrast. RADIATION DOSE REDUCTION: This exam was performed according to the departmental dose-optimization program which includes automated exposure control, adjustment of the mA and/or kV according to patient size and/or use of iterative reconstruction technique. COMPARISON:  Brain MRI 03/14/2017.  Head CT 05/11/2023. FINDINGS: Brain: Stable cerebral volume. Patchy chronic encephalomalacia from previous right MCA middle or posterior division cortical infarct. No midline shift, ventriculomegaly, mass effect, evidence of mass lesion, intracranial hemorrhage or evidence of cortically based acute infarction. Small chronic left cerebellar infarct also stable. Gray-white differentiation elsewhere is within normal limits for age. Vascular: Calcified atherosclerosis at the skull base. No suspicious intracranial vascular hyperdensity. Skull: Stable and intact. Sinuses/Orbits: Visualized paranasal sinuses and mastoids are stable and well aerated. Other: No acute orbit or scalp soft tissue injury identified. IMPRESSION: 1. No acute traumatic injury identified. 2. Stable non  contrast CT appearance of the chronic infarcts in the right MCA and left cerebellar artery territories. Electronically Signed   By: VEAR Hurst M.D.   On: 03/16/2024 12:41   DG Hip Unilat W or Wo Pelvis 2-3 Views Left Result Date: 03/16/2024 CLINICAL DATA:  73 year old female status post fall.  Pain. EXAM: DG HIP (WITH OR WITHOUT PELVIS) 2-3V LEFT COMPARISON:  CT Abdomen and Pelvis yesterday. FINDINGS: Three views at 1058 hours. Excreted IV contrast from the CT yesterday in nondistended urinary bladder. Bone mineralization is within normal limits for age. Calcified iliofemoral atherosclerosis. Femoral heads normally located. No pelvis fracture identified. Proximal left femur appears intact. Nonobstructed bowel-gas pattern. IMPRESSION: 1. No acute fracture or dislocation identified about the left hip or pelvis. 2. Excreted IV contrast in the urinary bladder from CT yesterday. Electronically Signed   By: VEAR Hurst M.D.   On: 03/16/2024 11:11   DG Ankle Complete Left Result Date: 03/16/2024 CLINICAL DATA:  Pain after fall  EXAM: LEFT ANKLE COMPLETE - 3 VIEW COMPARISON:  None Available. FINDINGS: Severe osteopenia. Soft tissue swelling about the ankle. There is a minimally displaced fracture along the distal tibia at the base of the medial malleolus. Is also a distal fibular metaphyseal mildly displaced oblique fracture. No dislocation. Slight degenerative changes. Calcaneal plantar spur. There are some calcifications along the course of the distal Achilles tendon as well. Please correlate for symptoms tendinosis. Scattered vascular calcifications. IMPRESSION: Mildly displaced fractures of the base of the medial malleolus and the distal fibular metaphysis. Soft tissue swelling. Osteopenia and chronic changes as well. Electronically Signed   By: Ranell Bring M.D.   On: 03/16/2024 11:10   CT ABDOMEN PELVIS W CONTRAST Result Date: 03/15/2024 CLINICAL DATA:  Acute nonlocalized abdominal pain. EXAM: CT ABDOMEN AND PELVIS WITH  CONTRAST TECHNIQUE: Multidetector CT imaging of the abdomen and pelvis was performed using the standard protocol following bolus administration of intravenous contrast. RADIATION DOSE REDUCTION: This exam was performed according to the departmental dose-optimization program which includes automated exposure control, adjustment of the mA and/or kV according to patient size and/or use of iterative reconstruction technique. CONTRAST:  75mL OMNIPAQUE  IOHEXOL  350 MG/ML SOLN COMPARISON:  CT abdomen pelvis 04/03/2023 FINDINGS: Lower chest: No acute abnormality. Hepatobiliary: Cholecystectomy. Pneumobilia. Liver is unremarkable. Pancreas: Unremarkable. Spleen: Unremarkable. Adrenals/Urinary Tract: Normal adrenal glands. No urinary calculi or hydronephrosis. Unremarkable bladder. Stomach/Bowel: Normal caliber large and small bowel. Stomach is within normal limits. Colonic diverticulosis without diverticulitis. Vascular/Lymphatic: No significant vascular findings are present. No enlarged abdominal or pelvic lymph nodes. Reproductive: Hysterectomy.  No adnexal mass. Other: No free intraperitoneal fluid or air. Musculoskeletal: No acute fracture. IMPRESSION: 1. No acute abnormality in the abdomen or pelvis. 2. Colonic diverticulosis without diverticulitis. Electronically Signed   By: Norman Gatlin M.D.   On: 03/15/2024 23:07    Labs: Recent Labs    03/15/24 2030  WBC 7.2  RBC 3.75*  HCT 38.0  PLT 193   Recent Labs    03/15/24 2030  NA 134*  K 4.2  CL 104  CO2 19*  BUN 20  CREATININE 1.31*  GLUCOSE 104*  CALCIUM  9.7   No results for input(s): LABPT, INR in the last 72 hours.  Review of Systems: ROS as detailed in HPI  Physical Exam: Body mass index is 30.6 kg/m.  Physical Exam   Left Lower Extremity: Gen: AAOx3, NAD  Left lower extremity: Short leg splint in place Wiggles toes SILT over toes CR<2s    Assessment and Plan: Ortho consult for Left closed trimalleolar ankle  fracture with displacement  -history, exam and imaging reviewed at length with patient/family at bedside -plan is for left ankle ORIF, possible syndesmosis and/or deltoid fixation, possible allograft -she may have this on outpatient basis if she is discharged to SNF -if she ends up remaining inpatient on hospitalist service, can plan on ORIF during admission. Otherwise fine for discharge and follow up for outpatient surgery -PT/OT  Lillia Mountain, MD Orthopaedic Surgeon [facility] [phone]  The risks and benefits were presented and reviewed. The risks due to hardware failure/irritation, new/persistent/recurrent infection, stiffness, nerve/vessel/tendon injury, nonunion/malunion of any fracture, wound healing issues, allograft usage, development of arthritis, failure of this surgery, possibility of external fixation in certain situations, possibility of delayed definitive surgery, need for further surgery, prolonged wound care including further soft tissue coverage procedures, thromboembolic events, anesthesia/medical complications/events perioperatively and beyond, amputation, death among others were discussed. The patient acknowledged the explanation, agreed to proceed with  the plan.

## 2024-03-18 NOTE — ED Provider Notes (Signed)
 Pt here with ankle fracture, boarding until insurance claim processed likely on Monday for SNF  Will need orthopedic surgery for ankle as outpatient  Physical Exam  BP 116/71   Pulse 84   Temp 98.9 F (37.2 C) (Oral)   Resp 18   Ht 5' 6 (1.676 m)   Wt 86 kg   SpO2 100%   BMI 30.60 kg/m   Physical Exam  Procedures  Procedures  ED Course / MDM    Medical Decision Making Amount and/or Complexity of Data Reviewed Radiology: ordered.  Risk OTC drugs. Prescription drug management.   Pt stable overnight - boarding currently for SNF placement       Letcher Schweikert, Donnice PARAS, MD 03/18/24 0830

## 2024-03-19 ENCOUNTER — Encounter (HOSPITAL_COMMUNITY): Payer: Self-pay | Admitting: Internal Medicine

## 2024-03-19 DIAGNOSIS — Z6831 Body mass index (BMI) 31.0-31.9, adult: Secondary | ICD-10-CM | POA: Diagnosis not present

## 2024-03-19 DIAGNOSIS — D519 Vitamin B12 deficiency anemia, unspecified: Secondary | ICD-10-CM | POA: Diagnosis present

## 2024-03-19 DIAGNOSIS — K581 Irritable bowel syndrome with constipation: Secondary | ICD-10-CM

## 2024-03-19 DIAGNOSIS — Z79899 Other long term (current) drug therapy: Secondary | ICD-10-CM | POA: Diagnosis not present

## 2024-03-19 DIAGNOSIS — Z8249 Family history of ischemic heart disease and other diseases of the circulatory system: Secondary | ICD-10-CM | POA: Diagnosis not present

## 2024-03-19 DIAGNOSIS — Z91048 Other nonmedicinal substance allergy status: Secondary | ICD-10-CM | POA: Diagnosis not present

## 2024-03-19 DIAGNOSIS — Y92008 Other place in unspecified non-institutional (private) residence as the place of occurrence of the external cause: Secondary | ICD-10-CM | POA: Diagnosis not present

## 2024-03-19 DIAGNOSIS — Z96653 Presence of artificial knee joint, bilateral: Secondary | ICD-10-CM | POA: Diagnosis present

## 2024-03-19 DIAGNOSIS — Z823 Family history of stroke: Secondary | ICD-10-CM | POA: Diagnosis not present

## 2024-03-19 DIAGNOSIS — W19XXXA Unspecified fall, initial encounter: Secondary | ICD-10-CM

## 2024-03-19 DIAGNOSIS — Z880 Allergy status to penicillin: Secondary | ICD-10-CM | POA: Diagnosis not present

## 2024-03-19 DIAGNOSIS — G8929 Other chronic pain: Secondary | ICD-10-CM | POA: Diagnosis present

## 2024-03-19 DIAGNOSIS — E119 Type 2 diabetes mellitus without complications: Secondary | ICD-10-CM | POA: Diagnosis present

## 2024-03-19 DIAGNOSIS — E66811 Obesity, class 1: Secondary | ICD-10-CM | POA: Diagnosis present

## 2024-03-19 DIAGNOSIS — D529 Folate deficiency anemia, unspecified: Secondary | ICD-10-CM | POA: Diagnosis present

## 2024-03-19 DIAGNOSIS — W109XXA Fall (on) (from) unspecified stairs and steps, initial encounter: Secondary | ICD-10-CM | POA: Diagnosis present

## 2024-03-19 DIAGNOSIS — S93432A Sprain of tibiofibular ligament of left ankle, initial encounter: Secondary | ICD-10-CM | POA: Diagnosis present

## 2024-03-19 DIAGNOSIS — I10 Essential (primary) hypertension: Secondary | ICD-10-CM | POA: Diagnosis present

## 2024-03-19 DIAGNOSIS — D539 Nutritional anemia, unspecified: Secondary | ICD-10-CM | POA: Diagnosis present

## 2024-03-19 DIAGNOSIS — M159 Polyosteoarthritis, unspecified: Secondary | ICD-10-CM | POA: Diagnosis present

## 2024-03-19 DIAGNOSIS — K219 Gastro-esophageal reflux disease without esophagitis: Secondary | ICD-10-CM | POA: Diagnosis present

## 2024-03-19 DIAGNOSIS — R1084 Generalized abdominal pain: Secondary | ICD-10-CM

## 2024-03-19 DIAGNOSIS — Z83438 Family history of other disorder of lipoprotein metabolism and other lipidemia: Secondary | ICD-10-CM | POA: Diagnosis not present

## 2024-03-19 DIAGNOSIS — E782 Mixed hyperlipidemia: Secondary | ICD-10-CM | POA: Diagnosis present

## 2024-03-19 DIAGNOSIS — I959 Hypotension, unspecified: Secondary | ICD-10-CM | POA: Diagnosis not present

## 2024-03-19 DIAGNOSIS — S82852A Displaced trimalleolar fracture of left lower leg, initial encounter for closed fracture: Secondary | ICD-10-CM | POA: Diagnosis present

## 2024-03-19 DIAGNOSIS — I252 Old myocardial infarction: Secondary | ICD-10-CM | POA: Diagnosis not present

## 2024-03-19 DIAGNOSIS — Z833 Family history of diabetes mellitus: Secondary | ICD-10-CM | POA: Diagnosis not present

## 2024-03-19 DIAGNOSIS — N289 Disorder of kidney and ureter, unspecified: Secondary | ICD-10-CM | POA: Diagnosis present

## 2024-03-19 DIAGNOSIS — Y92009 Unspecified place in unspecified non-institutional (private) residence as the place of occurrence of the external cause: Secondary | ICD-10-CM

## 2024-03-19 LAB — COMPREHENSIVE METABOLIC PANEL WITH GFR
ALT: 16 U/L (ref 0–44)
AST: 31 U/L (ref 15–41)
Albumin: 2.8 g/dL — ABNORMAL LOW (ref 3.5–5.0)
Alkaline Phosphatase: 36 U/L — ABNORMAL LOW (ref 38–126)
Anion gap: 10 (ref 5–15)
BUN: 32 mg/dL — ABNORMAL HIGH (ref 8–23)
CO2: 20 mmol/L — ABNORMAL LOW (ref 22–32)
Calcium: 9.2 mg/dL (ref 8.9–10.3)
Chloride: 103 mmol/L (ref 98–111)
Creatinine, Ser: 1.38 mg/dL — ABNORMAL HIGH (ref 0.44–1.00)
GFR, Estimated: 41 mL/min — ABNORMAL LOW (ref >60)
Glucose, Bld: 124 mg/dL — ABNORMAL HIGH (ref 70–99)
Potassium: 3.9 mmol/L (ref 3.5–5.1)
Sodium: 133 mmol/L — ABNORMAL LOW (ref 135–145)
Total Bilirubin: 0.5 mg/dL (ref 0.0–1.2)
Total Protein: 5.3 g/dL — ABNORMAL LOW (ref 6.5–8.1)

## 2024-03-19 LAB — CBC
HCT: 29.4 % — ABNORMAL LOW (ref 36.0–46.0)
Hemoglobin: 9.7 g/dL — ABNORMAL LOW (ref 12.0–15.0)
MCH: 33.3 pg (ref 26.0–34.0)
MCHC: 33 g/dL (ref 30.0–36.0)
MCV: 101 fL — ABNORMAL HIGH (ref 80.0–100.0)
Platelets: 144 K/uL — ABNORMAL LOW (ref 150–400)
RBC: 2.91 MIL/uL — ABNORMAL LOW (ref 3.87–5.11)
RDW: 12.9 % (ref 11.5–15.5)
WBC: 6.8 K/uL (ref 4.0–10.5)
nRBC: 0 % (ref 0.0–0.2)

## 2024-03-19 LAB — TYPE AND SCREEN
ABO/RH(D): A POS
Antibody Screen: NEGATIVE

## 2024-03-19 LAB — POC OCCULT BLOOD, ED: Fecal Occult Bld: NEGATIVE

## 2024-03-19 LAB — HEMOGLOBIN AND HEMATOCRIT, BLOOD
HCT: 26.7 % — ABNORMAL LOW (ref 36.0–46.0)
HCT: 29 % — ABNORMAL LOW (ref 36.0–46.0)
Hemoglobin: 9 g/dL — ABNORMAL LOW (ref 12.0–15.0)
Hemoglobin: 9.2 g/dL — ABNORMAL LOW (ref 12.0–15.0)

## 2024-03-19 LAB — TROPONIN I (HIGH SENSITIVITY): Troponin I (High Sensitivity): 14 ng/L (ref <18)

## 2024-03-19 MED ORDER — LACTULOSE 10 GM/15ML PO SOLN
20.0000 g | Freq: Once | ORAL | Status: AC
  Administered 2024-03-19: 20 g via ORAL
  Filled 2024-03-19: qty 30

## 2024-03-19 MED ORDER — SODIUM CHLORIDE 0.9 % IV SOLN: INTRAVENOUS | Status: DC

## 2024-03-19 MED ORDER — SODIUM CHLORIDE 0.9 % IV BOLUS
500.0000 mL | Freq: Once | INTRAVENOUS | Status: AC
  Administered 2024-03-19: 500 mL via INTRAVENOUS

## 2024-03-19 MED ORDER — SMOG ENEMA: 300.0000 mL | Freq: Once | RECTAL | Status: DC | PRN

## 2024-03-19 MED ORDER — MORPHINE SULFATE (PF) 2 MG/ML IV SOLN: 0.5000 mg | INTRAVENOUS | Status: DC | PRN

## 2024-03-19 MED ORDER — SENNOSIDES-DOCUSATE SODIUM 8.6-50 MG PO TABS
1.0000 | ORAL_TABLET | Freq: Two times a day (BID) | ORAL | Status: DC
  Administered 2024-03-19 – 2024-03-28 (×15): 1 via ORAL
  Filled 2024-03-19 (×15): qty 1

## 2024-03-19 MED ORDER — SODIUM CHLORIDE 0.9 % IV BOLUS
1000.0000 mL | Freq: Once | INTRAVENOUS | Status: AC
  Administered 2024-03-19: 1000 mL via INTRAVENOUS

## 2024-03-19 NOTE — H&P (Addendum)
 History and Physical    Patient: Rachel Black DOB: [birth year] DOA: 03/16/2024 DOS: the patient was seen and examined on 03/19/2024 PCP: Wallace Joesph LABOR, PA  Patient coming from: Home  Chief Complaint:  Chief Complaint  Patient presents with   Hip Pain   Ankle Pain   Shoulder Pain   HPI: Rachel Black is a 73 y.o. female with medical history significant of hypertension, hyperlipidemia, CVA, diabetes mellitus type 2, gout, and GERD presents after having a fall at home.  She has been experiencing diffuse and persistent stomach pain for approximately two years. On July 2nd, she developed acute right-sided abdominal pain radiating from the hip to the back, prompting her hospital visit. A CT scan of the abdomen pelvis with contrast showed no abnormalities and diverticulosis without signs of diverticulitis. She reports has been experiencing dark stools, which she describes as 'pretty dark,' but denies visible blood. She is taking iron supplements and Tylenol , but no NSAIDs. She feels that she has intermittently had these dark stools since her stomach pains been present.  Labs noted hemoglobin to be 12.3 at that time and creatinine 1.31.  Ultimately she was discharged home.  She fell at home while while following [name], who was opening the door to the carport. She is unsure if she lost consciousness but attributes the fall to slipping on a wet surface. She was unable to get up after the fall, and Christopher called an ambulance. Since the fall, she has been experiencing pain in her left ankle, which is being managed with oxycodone .  She last had a bowel movement was approximately 4 days ago, described as small, ball-like stools. She has been using MiraLAX  and fiber supplements at home to aid with bowel movements without significant response.  She denies feeling lightheaded or dizzy currently with reported low blood pressures.  Patient had been boarding in the ED since 7/3  after having be fall with plans on discharge to a SNF.  Initial x-rays of the left ankle revealed a mildly displaced fracture of the base of the medial malleolus and distal fibular metaphysis with soft tissue swelling.  Orthopedics have been consulted with plan for ORIF 1 week postinjury once swelling subsided.  However noted to have acute drop in blood pressure down to 83/43 today which seemed to improved with IV fluids.  Labs noted hemoglobin 9.7 with elevated MCV, platelets 144, sodium 133, BUN 32, and creatinine 1.38.  Stool guaiacs were noted to be negative.    Review of Systems: As mentioned in the history of present illness. All other systems reviewed and are negative. Past Medical History:  Diagnosis Date   Arthritis    PAIN AND OA BOTH KNEES AND SHOULDERS AND ELBOWS AND WRIST   Blood transfusion without reported diagnosis 2018   after cholecystectomy   Broken foot, right, closed, initial encounter 09/15/2021   no surgery needed, fell at home and went to ED   Diabetes mellitus    ORAL MEDICATION   Diverticulitis    GERD (gastroesophageal reflux disease)    Gout    NO RECENT FLARE UPS   H/O: rheumatic fever    AS A CHILD - NO KNOWN HEART MURMUR OR HEART PROBLEMS   Heart attack (HCC)    Hyperlipidemia    Hypertension    PFO (patent foramen ovale)    Stroke (HCC) 03/2017   Past Surgical History:  Procedure Laterality Date   CESAREAN SECTION     CHOLECYSTECTOMY N/A  12/09/2016   Procedure: LAPAROSCOPIC CHOLECYSTECTOMY WITH INTRAOPERATIVE CHOLANGIOGRAM;  Surgeon: Jina Nephew, MD;  Location: [facility];  Service: General;  Laterality: N/A;   COLONOSCOPY     COLONOSCOPY WITH PROPOFOL  N/A 01/30/2013   Procedure: COLONOSCOPY WITH PROPOFOL ;  Surgeon: Norleen LOISE Kiang, MD;  Location: [facility];  Service: Endoscopy;  Laterality: N/A;   ERCP N/A 12/08/2016   Procedure: ENDOSCOPIC RETROGRADE CHOLANGIOPANCREATOGRAPHY (ERCP);  Surgeon: Gwendlyn ONEIDA Buddy, MD;  Location: [facility];  Service:  Endoscopy;  Laterality: N/A;   ESOPHAGOGASTRODUODENOSCOPY (EGD) WITH PROPOFOL  N/A 01/30/2013   Procedure: ESOPHAGOGASTRODUODENOSCOPY (EGD) WITH PROPOFOL ;  Surgeon: Norleen LOISE Kiang, MD;  Location: [facility];  Service: Endoscopy;  Laterality: N/A;   LEFT HEART CATH AND CORONARY ANGIOGRAPHY N/A 10/18/2017   Procedure: LEFT HEART CATH AND CORONARY ANGIOGRAPHY;  Surgeon: [place], Peter M, MD;  Location: [facility] INVASIVE CV LAB;  Service: Cardiovascular;  Laterality: N/A;   LOOP RECORDER INSERTION N/A 03/18/2017   Procedure: Loop Recorder Insertion;  Surgeon: Inocencio Soyla Lunger, MD;  Location: [facility] INVASIVE CV LAB;  Service: Cardiovascular;  Laterality: N/A;   PARTIAL HYSTERECTOMY     TEE WITHOUT CARDIOVERSION N/A 03/16/2017   Procedure: TRANSESOPHAGEAL ECHOCARDIOGRAM (TEE);  Surgeon: Alveta Aleene PARAS, MD;  Location: [facility] ENDOSCOPY;  Service: Cardiovascular;  Laterality: N/A;   TOTAL ABDOMINAL HYSTERECTOMY     TOTAL KNEE ARTHROPLASTY Right 04/20/2014   Procedure: RIGHT TOTAL KNEE ARTHROPLASTY CONVERTED TO RIGHT KNEE REIMPLANTATION;  Surgeon: Lonni CINDERELLA Poli, MD;  Location: [facility];  Service: Orthopedics;  Laterality: Right;   TOTAL KNEE ARTHROPLASTY Left 08/23/2015   Procedure: LEFT TOTAL KNEE ARTHROPLASTY;  Surgeon: Lonni CINDERELLA Poli, MD;  Location: [facility];  Service: Orthopedics;  Laterality: Left;   VENTRAL HERNIA REPAIR     x2   Social History:  reports that she has never smoked. She has never been exposed to tobacco smoke. She has never used smokeless tobacco. She reports that she does not currently use alcohol. She reports that she does not use drugs.  Allergies  Allergen Reactions   Other Other (See Comments)    Some BANDAIDS cause skin irritation    Penicillins Itching     Tolerated Zosyn  couse 10/04/22    Family History  Problem Relation Age of Onset   Diabetes Mother    Hypertension Mother        entire family   Breast cancer Mother        diagnosed in her 45's   Stroke  Father        CVA   Hyperlipidemia Father        entire family   Diabetes Father    Heart disease Father        No details.  Not at an early age.     Crohn's disease Son    Irritable bowel syndrome Son    Colon cancer Neg Hx    Colon polyps Neg Hx    Esophageal cancer Neg Hx    Stomach cancer Neg Hx    Rectal cancer Neg Hx     Prior to Admission medications   Medication Sig Start Date End Date Taking? Authorizing Provider  acetaminophen  (TYLENOL ) 500 MG tablet Take 1,000 mg by mouth every 6 (six) hours as needed for mild pain or moderate pain.   Yes [provider]  allopurinol  (ZYLOPRIM ) 300 MG tablet TAKE 1 TABLET BY MOUTH 2 TIMES DAILY. 12/01/23  Yes Chandra Toribio POUR, MD  Calcium  Carb-Cholecalciferol (CALCIUM  + VITAMIN D3 PO) Take  2 tablets by mouth daily.   Yes [provider]  DULoxetine  (CYMBALTA ) 60 MG capsule Take 1 capsule (60 mg total) by mouth daily. 05/12/23  Yes Wallace Search A, PA  Emollient (UDDERLY SMOOTH) CREA Apply 1 application topically See admin instructions. Apply topically to skin folds as needed for rash/irritation   Yes [provider]  famotidine  (PEPCID ) 10 MG tablet Take 10 mg by mouth daily as needed for heartburn or indigestion.   Yes [provider]  fenofibrate  160 MG tablet Take 1 tablet (160 mg total) by mouth daily. 05/12/23  Yes Wallace Search A, PA  ferrous sulfate  325 (65 FE) MG tablet TAKE 1 TABLET BY MOUTH TWICE A DAY 08/16/23  Yes Abran Norleen SAILOR, MD  gabapentin  (NEURONTIN ) 300 MG capsule TAKE 1 CAPSULE BY MOUTH EVERYDAY AT BEDTIME 12/30/23  Yes Chandra Toribio POUR, MD  isosorbide  mononitrate (IMDUR ) 30 MG 24 hr tablet TAKE 1 TABLET BY MOUTH EVERY DAY 08/31/23  Yes Monge, Damien BROCKS, NP  olmesartan  (BENICAR ) 5 MG tablet Take 1 tablet (5 mg total) by mouth daily. 05/12/23  Yes Wallace Search A, PA  pantoprazole  (PROTONIX ) 40 MG tablet TAKE 1 TABLET BY MOUTH EVERY DAY 02/18/24  Yes Abran Norleen SAILOR, MD  polyethylene glycol  (MIRALAX  / GLYCOLAX ) 17 g packet Take 17 g by mouth daily as needed for moderate constipation.   Yes [provider]  rosuvastatin  (CRESTOR ) 40 MG tablet TAKE 1 TABLET BY MOUTH EVERY DAY 07/28/23  Yes Lavona Agent, MD  ACCU-CHEK AVIVA PLUS test strip USE AS INSTRUCTED ONCE A DAY. DX CODE E11.9 07/20/22   Abonza, Maritza, PA-C  Accu-Chek Softclix Lancets lancets Use as instructed once a day.  DX Code: E11.9 03/24/22   Blenda Headings, PA-C    Physical Exam: Vitals:   03/19/24 1015 03/19/24 1030 03/19/24 1115 03/19/24 1130  BP: (!) 108/54 101/60 105/62 112/63  Pulse: 85 79 75 73  Resp: (!) 25 (!) 25 (!) 21 (!) 21  Temp:      TempSrc:      SpO2: 100% 100% 100% 100%  Weight:      Height:        Constitutional: Early female currently in no acute distress Eyes: PERRL, lids and conjunctivae normal ENMT: Mucous membranes are moist.  Normal dentition.  Neck: normal, supple  Respiratory: clear to auscultation bilaterally, no wheezing, no crackles. Normal respiratory effort. No accessory muscle use.  Cardiovascular: Regular rate and rhythm, no murmurs / rubs / gallops. No extremity edema. 2+ pedal pulses.   Abdomen: no tenderness, no masses palpated.   Bowel sounds positive.  Musculoskeletal: no clubbing / cyanosis.  Left ankle in splint. Skin: no rashes, lesions, ulcers. No induration Neurologic: CN 2-12 grossly intact.   Strength 5/5 in all 4.  Psychiatric: Normal judgment and insight. Alert and oriented x 3. Normal mood.   Data Reviewed:  EKG revealed a normal sinus rhythm at 91 bpm.  Reviewed labs, imaging, and pertinent records as documented.  Assessment and Plan:  Left trimalleolar fracture secondary to fall Patient presents after having a fall at home that possibly occurred due to slippery surface.  X-rays revealed a left trimalleolar fracture.  Dr. Barton of orthopedics had been consulted and recommended surgery in approximately 1 week post injury.  Plan had been for  possible discharge to SNF, but due to questionable blood pressure issues and drop in hemoglobin initially recommended. - Admit to a progressive bed - Hip fracture order set utilized -  Oxycodone /morphine  IV as needed for pain - Orthopedics notified in regards to  Transient hypotension Acute.  Patient has had intermittent low blood pressure since coming in to the hospital.  Lowest  blood pressure noted to be 68/57, but patient appears to be asymptomatic.  Unclear if symptoms are related to this possible drop in hemoglobin or blood pressure cuff positioning. - Hold home blood pressure regimen - Continue IV fluids - Goal maps greater than 65  Macrocytic anemia Acute on chronic.  Hemoglobin noted to be 9.7, but had previously been 12.3 when checked on 7/2. Stool guaiacs were noted to be negative.  Patient had been typed and screened in case of need of blood products. - Recheck H&H and ensure hemoglobin not continuing to trend down - Consider need of transfusion if hemoglobin drops less than 8 or clinically symptomatic consult to [facility] gastro[facility]ology.  Abdominal pain Acute on chronic.  Patient reports a 2-year history of abdominal pain that acutely worsened on the second for which was her initial reason for evaluation in the ED.  CT scan of the abdomen pelvis with contrast at that time did not reveal any acute abnormality with diverticulosis and no signs of diverticulitis.  Patient makes note that she has been followed by [facility] for her abdominal pain previously. - Continue to monitor  Irritable bowel syndrome with constipation Acute.  Patient reports last bowel movement 4 days ago.  She has been taking MiraLAX . - Continue MiraLAX  - Lactulose  p.o. x 1 dose - Smog enema prn   Renal insufficiency Acute.  Creatinine noted to be elevated up to 1.38 with BUN 32.  Baseline creatinine previously had been around 1-1.1. - Recheck kidney function in a.m.  Mixed hyperlipidemia  - Continue  Crestor  and fenofibrate   GERD - Continue PPI   DVT prophylaxis: SCDs Advance Care Planning:   Code Status: Prior   Consults: Orthopedics Family Communication: Family updated at bedside  Severity of Illness: The appropriate patient status for this patient is INPATIENT. Inpatient status is judged to be reasonable and necessary in order to provide the required intensity of service to ensure the patient's safety. The patient's presenting symptoms, physical exam findings, and initial radiographic and laboratory data in the context of their chronic comorbidities is felt to place them at high risk for further clinical deterioration. Furthermore, it is not anticipated that the patient will be medically stable for discharge from the hospital within 2 midnights of admission.   * I certify that at the point of admission it is my clinical judgment that the patient will require inpatient hospital care spanning beyond 2 midnights from the point of admission due to high intensity of service, high risk for further deterioration and high frequency of surveillance required.*  Author: Maximino DELENA Sharps, MD 03/19/2024 12:30 PM  For on call review [email or web address].

## 2024-03-19 NOTE — ED Notes (Signed)
 Admitting at bedside

## 2024-03-19 NOTE — Plan of Care (Signed)
  Problem: Clinical Measurements: Goal: Will remain free from infection Outcome: Progressing Goal: Diagnostic test results will improve Outcome: Progressing   Problem: Activity: Goal: Risk for activity intolerance will decrease Outcome: Progressing   Problem: Elimination: Goal: Will not experience complications related to bowel motility Outcome: Progressing   Problem: Pain Managment: Goal: General experience of comfort will improve and/or be controlled Outcome: Progressing   Problem: Safety: Goal: Ability to remain free from injury will improve Outcome: Progressing   Problem: Skin Integrity: Goal: Risk for impaired skin integrity will decrease Outcome: Progressing

## 2024-03-19 NOTE — ED Provider Notes (Signed)
 Patient is holding in the ED for possible SNF in anticipation of need for surgery.  I was notified by the nurse this morning that patient is having low blood pressure with blood pressures in the 80s systolic.  Patient denies any trouble with any chest pain or abdominal pain.  She is not having any fevers or chills.  She has not had any vomiting or diarrhea recently. Physical Exam  BP (!) 84/53   Pulse 94   Temp 98.1 F (36.7 C) (Oral)   Resp (!) 25   Ht 1.676 m (5' 6)   Wt 86 kg   SpO2 99%   BMI 30.60 kg/m   Physical Exam Vitals and nursing note reviewed.  Constitutional:      General: She is not in acute distress.    Appearance: She is well-developed.  HENT:     Head: Normocephalic and atraumatic.     Right Ear: External ear normal.     Left Ear: External ear normal.  Eyes:     General: No scleral icterus.       Right eye: No discharge.        Left eye: No discharge.     Conjunctiva/sclera: Conjunctivae normal.  Neck:     Trachea: No tracheal deviation.  Cardiovascular:     Rate and Rhythm: Normal rate.  Pulmonary:     Effort: Pulmonary effort is normal. No respiratory distress.     Breath sounds: No stridor.  Abdominal:     General: There is no distension.  Musculoskeletal:        General: No swelling or deformity.     Cervical back: Neck supple.  Skin:    General: Skin is warm and dry.     Findings: No rash.  Neurological:     Mental Status: She is alert. Mental status is at baseline.     Cranial Nerves: No dysarthria or facial asymmetry.     Motor: No seizure activity.     Procedures  Procedures  ED Course / MDM   Clinical Course as of 03/19/24 1221  Sun Mar 19, 2024  1130 CBC(!) Hemoglobin has dropped 3 points [JK]  1130 Comprehensive metabolic panel(!) Creatinine increased compared to previous [JK]  1130 Blood pressure has improved with IV hydration [JK]  1155 POC occult blood, ED Hemoccult negative [JK]    Clinical Course User Index [JK]  Randol Simmonds, MD   Medical Decision Making Amount and/or Complexity of Data Reviewed Labs: ordered. Decision-making details documented in ED Course. Radiology: ordered.  Risk Prescription drug management.   With patient's low blood pressure will order labs including CBC metabolic panel lactic acid level.  Will get an EKG and give her a fluid bolus continue to monitor.  Discontinued BP and isosorbide   Labs notable for decrease in hgb.  Hemoccult negative at this time.  Patient does not have any significant hematoma on exam.  I doubt that her ankle fracture would account for this drop in hemoglobin.  With her hypotension and decreased blood count from earlier we will consult hospitalist for admission further evaluation.       Randol Simmonds, MD 03/19/24 1236

## 2024-03-20 DIAGNOSIS — S82852A Displaced trimalleolar fracture of left lower leg, initial encounter for closed fracture: Secondary | ICD-10-CM | POA: Diagnosis not present

## 2024-03-20 LAB — URINALYSIS, ROUTINE W REFLEX MICROSCOPIC
Bilirubin Urine: NEGATIVE
Glucose, UA: NEGATIVE mg/dL
Hgb urine dipstick: NEGATIVE
Ketones, ur: NEGATIVE mg/dL
Nitrite: POSITIVE — AB
Protein, ur: NEGATIVE mg/dL
Specific Gravity, Urine: 1.014 (ref 1.005–1.030)
pH: 5 (ref 5.0–8.0)

## 2024-03-20 LAB — CBC
HCT: 30.5 % — ABNORMAL LOW (ref 36.0–46.0)
Hemoglobin: 9.8 g/dL — ABNORMAL LOW (ref 12.0–15.0)
MCH: 32.2 pg (ref 26.0–34.0)
MCHC: 32.1 g/dL (ref 30.0–36.0)
MCV: 100.3 fL — ABNORMAL HIGH (ref 80.0–100.0)
Platelets: 146 K/uL — ABNORMAL LOW (ref 150–400)
RBC: 3.04 MIL/uL — ABNORMAL LOW (ref 3.87–5.11)
RDW: 12.8 % (ref 11.5–15.5)
WBC: 4.9 K/uL (ref 4.0–10.5)
nRBC: 0 % (ref 0.0–0.2)

## 2024-03-20 LAB — RETICULOCYTES
Immature Retic Fract: 16 % — ABNORMAL HIGH (ref 2.3–15.9)
RBC.: 2.82 MIL/uL — ABNORMAL LOW (ref 3.87–5.11)
Retic Count, Absolute: 54.7 K/uL (ref 19.0–186.0)
Retic Ct Pct: 1.9 % (ref 0.4–3.1)

## 2024-03-20 LAB — IRON AND TIBC
Iron: 79 ug/dL (ref 28–170)
Saturation Ratios: 26 % (ref 10.4–31.8)
TIBC: 309 ug/dL (ref 250–450)
UIBC: 230 ug/dL

## 2024-03-20 LAB — BASIC METABOLIC PANEL WITH GFR
Anion gap: 8 (ref 5–15)
BUN: 24 mg/dL — ABNORMAL HIGH (ref 8–23)
CO2: 22 mmol/L (ref 22–32)
Calcium: 9.3 mg/dL (ref 8.9–10.3)
Chloride: 107 mmol/L (ref 98–111)
Creatinine, Ser: 1.21 mg/dL — ABNORMAL HIGH (ref 0.44–1.00)
GFR, Estimated: 48 mL/min — ABNORMAL LOW (ref >60)
Glucose, Bld: 93 mg/dL (ref 70–99)
Potassium: 4.3 mmol/L (ref 3.5–5.1)
Sodium: 137 mmol/L (ref 135–145)

## 2024-03-20 LAB — FOLATE: Folate: 4.8 ng/mL — ABNORMAL LOW (ref >5.9)

## 2024-03-20 LAB — VITAMIN B12: Vitamin B-12: 55 pg/mL — ABNORMAL LOW (ref 180–914)

## 2024-03-20 LAB — FERRITIN: Ferritin: 233 ng/mL (ref 11–307)

## 2024-03-20 MED ORDER — CHLORHEXIDINE GLUCONATE 4 % EX SOLN
60.0000 mL | Freq: Once | CUTANEOUS | Status: AC
  Filled 2024-03-20: qty 60

## 2024-03-20 MED ORDER — SODIUM CHLORIDE 0.9 % IV SOLN: INTRAVENOUS | Status: AC

## 2024-03-20 MED ORDER — CEFAZOLIN SODIUM-DEXTROSE 2-4 GM/100ML-% IV SOLN
2.0000 g | INTRAVENOUS | Status: AC
  Filled 2024-03-20: qty 100

## 2024-03-20 MED ORDER — METHOCARBAMOL 500 MG PO TABS
500.0000 mg | ORAL_TABLET | Freq: Four times a day (QID) | ORAL | Status: DC | PRN
  Administered 2024-03-24 – 2024-03-27 (×6): 500 mg via ORAL
  Filled 2024-03-20 (×6): qty 1

## 2024-03-20 NOTE — TOC Initial Note (Signed)
 Transition of Care [facility]) - Initial/Assessment Note    Patient Details  Name: Rachel Black MRN: [id number] Date of Birth: August 13, 1951  [facility] CM/SW Contact:    Lauraine FORBES Saa, LCSW Phone Number: 03/20/2024, 12:10 PM  Clinical Narrative:                  12:10 PM Per hospitalist, patient is expected to discharge to [facility] SNF end of this week or beginning of next week. CSW to submit insurance authorization request closer to expected discharge date.   Expected Discharge Plan: Skilled Nursing Facility Barriers to Discharge: Continued Medical Work up, English as a second language teacher   Patient Goals and CMS Choice Patient states their goals for this hospitalization and ongoing recovery are:: to discharge to [facility]          Expected Discharge Plan and Services In-house Referral: Clinical Social Work   Post Acute Care Choice: Skilled Nursing Facility Living arrangements for the past 2 months: Single Family Home                                      Prior Living Arrangements/Services Living arrangements for the past 2 months: Single Family Home Lives with:: Spouse Patient language and need for interpreter reviewed:: Yes              Criminal Activity/Legal Involvement Pertinent to Current Situation/Hospitalization: No - Comment as needed  Activities of Daily Living   ADL Screening (condition at time of admission) Independently performs ADLs?: No Does the patient have a NEW difficulty with bathing/dressing/toileting/self-feeding that is expected to last >3 days?: Yes (Initiates electronic notice to provider for possible OT consult) Does the patient have a NEW difficulty with getting in/out of bed, walking, or climbing stairs that is expected to last >3 days?: Yes (Initiates electronic notice to provider for possible PT consult) Does the patient have a NEW difficulty with communication that is expected to last >3 days?: No Is the  patient deaf or have difficulty hearing?: No Does the patient have difficulty seeing, even when wearing glasses/contacts?: No Does the patient have difficulty concentrating, remembering, or making decisions?: No  Permission Sought/Granted Permission sought to share information with : Family Supports, Oceanographer granted to share information with : No (Contact information on chart)  Share Information with NAME: Aundra Pung  Permission granted to share info w AGENCY: [facility]  Permission granted to share info w Relationship: Spouse  Permission granted to share info w Contact Information: [phone]  Emotional Assessment       Orientation: : Oriented to Self, Oriented to Place, Oriented to  Time, Oriented to Situation Alcohol / Substance Use: Not Applicable Psych Involvement: No (comment)  Admission diagnosis:  Left trimalleolar fracture [S82.852A] Closed trimalleolar fracture of left ankle, initial encounter [S82.852A] Patient Active Problem List   Diagnosis Date Noted   Left trimalleolar fracture 03/19/2024   Fall at home, initial encounter 03/19/2024   Transient hypotension 03/19/2024   Macrocytic anemia 03/19/2024   Anxiety 01/31/2023   Acute pulmonary embolism (HCC) 01/14/2023   H/O: CVA (cerebrovascular accident) 01/14/2023   CAD S/P percutaneous coronary angioplasty 01/14/2023   Atopic dermatitis 11/01/2022   Inflammatory polyarthropathy (HCC) 11/01/2022   Palpitations 10/25/2022   Left hip pain 10/04/2022   Irritable bowel syndrome with constipation 03/16/2022   Influenza B 09/16/2021   Cryptogenic stroke (HCC) 01/13/2021  GERD without esophagitis 11/26/2020   Iron deficiency 11/26/2020   Generalized abdominal pain 11/26/2020   Family history of breast cancer in mother 11/27/2019   Cataract of both eyes 11/27/2019   Coronary artery disease involving native coronary artery of native heart without angina pectoris 02/18/2018    NSTEMI (non-ST elevated myocardial infarction) (HCC)    History of rheumatic fever 06/30/2017   PFO (patent foramen ovale)    Cerebrovascular accident (CVA) due to embolism of right middle cerebral artery (HCC) 03/14/2017   Elevated LFTs    Choledocholithiasis with obstruction 12/07/2016   AKI (acute kidney injury) (HCC) 12/07/2016   Osteoarthritis of right knee 08/23/2015   Status post total left knee replacement 08/23/2015   Arthritis of knee, right 04/20/2014   Status post total right knee replacement 04/20/2014   Flushing reaction 03/10/2012   Type 2 diabetes mellitus with other specified complication (HCC) 02/17/2007   GOUT 02/17/2007   Severe obesity (BMI >= 40) (HCC) 02/17/2007   Mixed hyperlipidemia 02/17/2007   Hypertension associated with diabetes (HCC) 02/17/2007   PCP:  Wallace Joesph LABOR, PA Pharmacy:   [id number] - [place], [place] - [address] [place] [address] Phone: [phone] Fax: [phone]     Social Drivers of Health (SDOH) Social History: SDOH Screenings   Food Insecurity: No Food Insecurity (03/19/2024)  Housing: Low Risk  (03/19/2024)  Transportation Needs: No Transportation Needs (03/19/2024)  Utilities: Not At Risk (03/19/2024)  Depression (PHQ2-9): Low Risk  (05/12/2023)  Social Connections: Moderately Integrated (03/19/2024)  Tobacco Use: Low Risk  (03/19/2024)   SDOH Interventions:     Readmission Risk Interventions     No data to display

## 2024-03-20 NOTE — Progress Notes (Signed)
 PROGRESS NOTE    Rachel Black  FMW:[id number] DOB: [birth year] DOA: 03/16/2024 PCP: Wallace Joesph LABOR, PA    Brief Narrative:   Rachel Black is a 73 y.o. female with past medical history significant for HTN, HLD, CVA, DM 2, gout, chronic abdominal pain, IBS, GERD who presented to [facility] ED on 03/16/2024 with left ankle pain following a fall at home.  Patient was following her husband who was opening the door to the carport, feels like she slipped on a wet surface and unable to get up following the fall.  Complaining of left ankle pain.  She has been experiencing diffuse and persistent stomach pain for approximately two years. On July 2nd, she developed acute right-sided abdominal pain radiating from the hip to the back, prompting her hospital visit. A CT scan of the abdomen pelvis with contrast showed no abnormalities and diverticulosis without signs of diverticulitis. She reports has been experiencing dark stools, which she describes as 'pretty dark,' but denies visible blood. She is taking iron supplements and Tylenol , but no NSAIDs. She feels that she has intermittently had these dark stools since her stomach pains been present.  Labs noted hemoglobin to be 12.3 at that time and creatinine 1.31.  Ultimately she was discharged home. She last had a bowel movement was approximately 4 days ago, described as small, ball-like stools. She has been using MiraLAX  and fiber supplements at home to aid with bowel movements without significant response.  She denies feeling lightheaded or dizzy currently with reported low blood pressures.   Patient had been boarding in the ED since 7/3 after having be fall with plans on discharge to a SNF.  Initial x-rays of the left ankle revealed a mildly displaced fracture of the base of the medial malleolus and distal fibular metaphysis with soft tissue swelling.  Orthopedics have been consulted with plan for ORIF 1 week postinjury once swelling subsided.  However noted to have  acute drop in blood pressure down to 83/43 today which seemed to improved with IV fluids.  Labs noted hemoglobin 9.7 with elevated MCV, platelets 144, sodium 133, BUN 32, and creatinine 1.38.  Stool guaiacs were noted to be negative.  TRH consulted for admission.  Assessment & Plan:   Left trimalleolar fracture secondary to fall Patient presents after having a fall at home that possibly occurred due to slippery surface.  X-rays revealed a left trimalleolar fracture.  Dr. Barton of orthopedics had been consulted and recommended surgery in approximately 1 week post injury.  Plan had been for possible discharge to SNF, but due to questionable blood pressure issues and drop in hemoglobin; EDP requested admission. -- Orthopedics reconsulted given patient now inpatient; currently plans for operative management later this week or early next week -- Percocet 5-325 mg p.o. q6h PRN  moderate pain -- Morphine  0.5 mg IV q2h PRN severe pain -- Robaxin  500 mg p.o. q6h PRN muscle spasms -- PT/OT evaluation, TOC to follow-up for SNF placement -- Further per orthopedics  Transient hypotension: Resolved Hx HTN Patient has had intermittent low blood pressure since coming in to the hospital.  Lowest  blood pressure noted to be 68/57, but patient appears to be asymptomatic.  Unclear if symptoms are related to this possible drop in hemoglobin or blood pressure cuff positioning. -- BP 105/51 this morning -- Holding home Imdur  and olmesartan  -- Continue IV fluids for another 24 hours -- Continue monitor BP closely   Macrocytic anemia Acute on chronic.  Hemoglobin noted to  be 9.7, but had previously been 12.3 when checked on 7/2. Stool guaiacs were noted to be negative.  Patient had been typed and screened in case of need of blood products. -- Anemia panel: Pending -- Hgb 9.7>9.0>9.2>9.8; stable -- Repeat H&H in a.m.   Chronic abdominal pain History of IBS Acute on chronic.  Patient reports a 2-year history  of abdominal pain that acutely worsened on the second for which was her initial reason for evaluation in the ED.  CT scan of the abdomen pelvis with contrast at that time did not reveal any acute abnormality with diverticulosis and no signs of diverticulitis.  Patient makes note that she has been followed by [facility] for her abdominal pain previously. -- Outpatient follow-up with gastroenterology   Irritable bowel syndrome with constipation Patient reports last bowel movement 4 days ago.  She has been taking MiraLAX .  Received lactulose  on admission. -- Continue MiraLAX  daily -- Senokot-as 1 tablet twice daily -- Smog enema prn    Renal insufficiency Acute.  Creatinine noted to be elevated up to 1.38 with BUN 32.  Baseline creatinine previously had been around 1-1.1. - Recheck kidney function in a.m.   Mixed hyperlipidemia  -- Crestor  40 m p.o. daily -- Fenofibrate  160 mg p.o. daily   GERD -- Protonix  40 mg p.o. daily -- Pepcid  10 mg p.o. twice daily  Hx Gout -- Allopurinol  3 mg p.o. twice daily  Obesity, class I Body mass index is 31.81 kg/m.    DVT prophylaxis: SCDs Start: 03/19/24 1503    Code Status: Full Code Family Communication: Updated spouse present at bedside this morning  Disposition Plan:  Level of care: Progressive Status is: Inpatient Remains inpatient appropriate because: Pending surgical invention for trimalleolar ankle fracture    Consultants:  Orthopedics  Procedures:  None  Antimicrobials:  None   Subjective: Patient seen examined bedside, lying in bed.  Spouse present at bedside.  No specific complaints this morning other than wanting to know when her surgery will be planned for her ankle fracture.  Denies any current pain.  Blood pressure remained stable, will continue to hold antihypertensives.  No other [place] questions, concerns or complaints at this time.  Denies headache, no dizziness, no visual disturbance, no chest pain, no  palpitations, no shortness of breath, no abdominal pain, no fever/chills/night sweats, no nausea/vomiting/diarrhea, no focal weakness, no fatigue, no paresthesias.  No acute events overnight per nurse staff.  Objective: Vitals:   03/20/24 0247 03/20/24 0324 03/20/24 0757 03/20/24 0844  BP: (!) 114/50 (!) 105/51 111/69 107/61  Pulse:   73 79  Resp: (!) 21  14 (!) 21  Temp:   98.2 F (36.8 C)   TempSrc:   Oral   SpO2: 98%  96% 94%  Weight:      Height:        Intake/Output Summary (Last 24 hours) at 03/20/2024 1054 Last data filed at 03/20/2024 0600 Gross per 24 hour  Intake 2217.23 ml  Output 1300 ml  Net 917.23 ml   Filed Weights   03/16/24 1005 03/19/24 1347  Weight: 86 kg 89.4 kg    Examination:  Physical Exam: GEN: NAD, alert and oriented x 3, obese HEENT: NCAT, PERRL, EOMI, sclera clear, MMM PULM: CTAB w/o wheezes/crackles, normal respiratory effort on room air CV: RRR w/o M/G/R GI: abd soft, NTND, NABS, no R/G/M MSK: no peripheral edema, left lower extremity with splint in place, neurovascular intact, moves all extremities independently NEURO: No focal neurological  deficit PSYCH: normal mood/affect Integumentary: dry/intact, no rashes or wounds    Data Reviewed: I have personally reviewed following labs and imaging studies  CBC: Recent Labs  Lab 03/15/24 2030 03/19/24 0857 03/19/24 1340 03/19/24 1840 03/20/24 0256  WBC 7.2 6.8  --   --  4.9  NEUTROABS 5.4  --   --   --   --   HGB 12.3 9.7* 9.0* 9.2* 9.8*  HCT 38.0 29.4* 26.7* 29.0* 30.5*  MCV 101.3* 101.0*  --   --  100.3*  PLT 193 144*  --   --  146*   Basic Metabolic Panel: Recent Labs  Lab 03/15/24 2030 03/19/24 0857 03/20/24 0256  NA 134* 133* 137  K 4.2 3.9 4.3  CL 104 103 107  CO2 19* 20* 22  GLUCOSE 104* 124* 93  BUN 20 32* 24*  CREATININE 1.31* 1.38* 1.21*  CALCIUM  9.7 9.2 9.3   GFR: Estimated Creatinine Clearance: 48 mL/min (A) (by C-G formula based on SCr of 1.21 mg/dL  (H)). Liver Function Tests: Recent Labs  Lab 03/15/24 2030 03/19/24 0857  AST 18 31  ALT 13 16  ALKPHOS 43 36*  BILITOT 0.7 0.5  PROT 6.4* 5.3*  ALBUMIN  3.9 2.8*   Recent Labs  Lab 03/15/24 2030  LIPASE 40   No results for input(s): AMMONIA in the last 168 hours. Coagulation Profile: No results for input(s): INR, PROTIME in the last 168 hours. Cardiac Enzymes: No results for input(s): CKTOTAL, CKMB, CKMBINDEX, TROPONINI in the last 168 hours. BNP (last 3 results) No results for input(s): PROBNP in the last 8760 hours. HbA1C: No results for input(s): HGBA1C in the last 72 hours. CBG: No results for input(s): GLUCAP in the last 168 hours. Lipid Profile: No results for input(s): CHOL, HDL, LDLCALC, TRIG, CHOLHDL, LDLDIRECT in the last 72 hours. Thyroid  Function Tests: No results for input(s): TSH, T4TOTAL, FREET4, T3FREE, THYROIDAB in the last 72 hours. Anemia Panel: Recent Labs    03/20/24 0652 03/20/24 0730 03/20/24 0743  VITAMINB12  --   --  55*  FOLATE  --  4.8*  --   FERRITIN  --   --  233  TIBC  --   --  309  IRON  --   --  79  RETICCTPCT 1.9  --   --    Sepsis Labs: No results for input(s): PROCALCITON, LATICACIDVEN in the last 168 hours.  No results found for this or any previous visit (from the past 240 hours).       Radiology Studies: No results found.      Scheduled Meds:  allopurinol   300 mg Oral BID   DULoxetine   60 mg Oral Daily   famotidine   10 mg Oral BID   fenofibrate   160 mg Oral Daily   ferrous sulfate   325 mg Oral BID   gabapentin   300 mg Oral QHS   pantoprazole   40 mg Oral Daily   polyethylene glycol  17 g Oral Daily   rosuvastatin   40 mg Oral Daily   senna-docusate  1 tablet Oral BID   Continuous Infusions:  sodium chloride  75 mL/hr at 03/20/24 0841     LOS: 1 day    Time spent: 52 minutes spent on 03/20/2024 caring for this patient face-to-face including chart review,  ordering labs/tests, documenting, discussion with nursing staff, consultants, updating family and interview/physical exam    Camellia PARAS [place], DO [facility] Available via Epic secure chat 7am-7pm After these hours, please refer  to coverage provider listed on [email or web address] 03/20/2024, 10:54 AM

## 2024-03-20 NOTE — Care Plan (Signed)
 Orthopaedic Surgery Plan of Care Note   -history and imaging reviewed with patient -plan for left ankle ORIF during ongoing admission on Wed 03/22/24 -please keep NPO and hold VTE ppx from midnight  -preop orders placed   Lillia Mountain, MD Orthopaedic Surgery [facility]

## 2024-03-20 NOTE — Discharge Instructions (Signed)
 Netta Cedars, MD [facility]  Please read the following information regarding your care after surgery.  Medications  You only need a prescription for the narcotic pain medicine (ex. oxycodone, Percocet, Norco).  All of the other medicines listed below are available over the counter. ? Aleve 2 pills twice a day for the first 3 days after surgery. ? acetominophen (Tylenol) 650 mg every 4-6 hours as you need for minor to moderate pain ? oxycodone as prescribed for severe pain  ? To help prevent blood clots, take aspirin (81 mg) twice daily for at least 42 days after surgery.  You should also get up every hour while you are awake to move around.  Weight Bearing ? Do NOT bear any weight on the operated leg or foot. This means do NOT touch your surgical leg to the ground!  Cast / Splint / Dressing ? If you have a splint, do NOT remove this. Keep your splint, cast or dressing clean and dry.  Don't put anything (coat hanger, pencil, etc) down inside of it.  If it gets wet, call the office immediately to schedule an appointment for a cast change.  Swelling IMPORTANT: It is normal for you to have swelling where you had surgery. To reduce swelling and pain, keep at least 3 pillows under your leg so that your toes are above your nose and your heel is above the level of your hip.  It may be necessary to keep your foot or leg elevated for several weeks.  This is critical to helping your incisions heal and your pain to feel better.  Follow Up Call my office at [phone] when you are discharged from the hospital or surgery center to schedule an appointment to be seen 7-10 days after surgery.  Call my office at [phone] if you develop a fever >101.5 F, nausea, vomiting, bleeding from the surgical site or severe pain.     Post Anesthesia Home Care Instructions  Activity: Get plenty of rest for the remainder of the day. A responsible individual must stay with you for 24 hours following the  procedure.  For the next 24 hours, DO NOT: -Drive a car -Advertising copywriter -Drink alcoholic beverages -Take any medication unless instructed by your physician -Make any legal decisions or sign important papers.  Meals: Start with liquid foods such as gelatin or soup. Progress to regular foods as tolerated. Avoid greasy, spicy, heavy foods. If nausea and/or vomiting occur, drink only clear liquids until the nausea and/or vomiting subsides. Call your physician if vomiting continues.  Special Instructions/Symptoms: Your throat may feel dry or sore from the anesthesia or the breathing tube placed in your throat during surgery. If this causes discomfort, gargle with warm salt water. The discomfort should disappear within 24 hours.  If you had a scopolamine patch placed behind your ear for the management of post- operative nausea and/or vomiting:  1. The medication in the patch is effective for 72 hours, after which it should be removed.  Wrap patch in a tissue and discard in the trash. Wash hands thoroughly with soap and water. 2. You may remove the patch earlier than 72 hours if you experience unpleasant side effects which may include dry mouth, dizziness or visual disturbances. 3. Avoid touching the patch. Wash your hands with soap and water after contact with the patch.  Regional Anesthesia Blocks  1. You may not be able to move or feel the "blocked" extremity after a regional anesthetic block. This may last may last  from 3-48 hours after placement, but it will go away. The length of time depends on the medication injected and your individual response to the medication. As the nerves start to wake up, you may experience tingling as the movement and feeling returns to your extremity. If the numbness and inability to move your extremity has not gone away after 48 hours, please call your surgeon.   2. The extremity that is blocked will need to be protected until the numbness is gone and the  strength has returned. Because you cannot feel it, you will need to take extra care to avoid injury. Because it may be weak, you may have difficulty moving it or using it. You may not know what position it is in without looking at it while the block is in effect.  3. For blocks in the legs and feet, returning to weight bearing and walking needs to be done carefully. You will need to wait until the numbness is entirely gone and the strength has returned. You should be able to move your leg and foot normally before you try and bear weight or walk. You will need someone to be with you when you first try to ensure you do not fall and possibly risk injury.  4. Bruising and tenderness at the needle site are common side effects and will resolve in a few days.  5. Persistent numbness or new problems with movement should be communicated to the surgeon or the [facility] [phone] [facility] [phone]).

## 2024-03-20 NOTE — Plan of Care (Signed)
   Problem: Safety: Goal: Ability to remain free from injury will improve Outcome: Progressing

## 2024-03-20 NOTE — Progress Notes (Signed)
 Physical Therapy Treatment Patient Details Name: Rachel Black MRN: [id number] DOB: July 30, 1951 Today's Date: 03/20/2024   History of Present Illness Pt is a 73 y.o. F who presents after a fall with CT L ankle trimalleolar fx. PMH includes: arthritis, DM, gout, PFO, CVA, HTN, ERCP, TKA bil., CAD    PT Comments  Patient assisted supine to sit at EOB with min assist and use of rail. Sat EOB ~4 minutes, including practicing lateral scooting along EOB with bed pad under pelvis. Preparing chair for potential OOB and educating pt on how this would work when she reported need to have a BM. No drop-arm BSC present and returned to supine for bedpan. Rolling with up to max assist for full roll to place pan. RN made aware pt on bedpan with callbell.     If plan is discharge home, recommend the following: Two people to help with walking and/or transfers;Two people to help with bathing/dressing/bathroom   Can travel by private vehicle     No  Equipment Recommendations  BSC/3in1;Wheelchair (measurements PT);Wheelchair cushion (measurements PT) (drop arm bedside commode, slide board)    Recommendations for Other Services       Precautions / Restrictions Precautions Precautions: Fall Recall of Precautions/Restrictions: Impaired Precaution/Restrictions Comments: did not recall NWB LLE Splint/Cast - Date Prophylactic Dressing Applied (if applicable): 03/19/24 Restrictions Weight Bearing Restrictions Per Provider Order: Yes LLE Weight Bearing Per Provider Order: Non weight bearing     Mobility  Bed Mobility Overal bed mobility: Needs Assistance Bed Mobility: Supine to Sit, Sit to Supine, Rolling Rolling: Max assist, Used rails   Supine to sit: Min assist Sit to supine: Contact guard assist   General bed mobility comments: Assist for bringing trunk up to sitting; no assist to return to supine; rolling rt and lt for getting on bedpan with poor ability to reach due to shoulder limitations     Transfers Overall transfer level: Needs assistance Equipment used: None Transfers: Bed to chair/wheelchair/BSC Sit to Stand:  (from elevated EOB)          Lateral/Scoot Transfers: Max assist, +2 physical assistance General transfer comment: lateral scoot along EOB to [facility] (to her left) with pt unable to clear buttocks but more like shimmy sideways    Ambulation/Gait               General Gait Details: unable   Stairs             Wheelchair Mobility     Tilt Bed    Modified Rankin (Stroke Patients Only)       Balance Overall balance assessment: Needs assistance Sitting-balance support: Feet supported Sitting balance-Leahy Scale: Fair                                      Hotel manager: No apparent difficulties  Cognition Arousal: Alert Behavior During Therapy: Flat affect   PT - Cognitive impairments: Problem solving, Safety/Judgement, Memory, Awareness, Initiation, Sequencing                       PT - Cognition Comments: slow to respond to ?s and commands; unaware of NWB status; vc to adhere to precautions Following commands: Intact      Cueing Cueing Techniques: Tactile cues, Verbal cues  Exercises      General Comments        Pertinent Vitals/Pain Pain Assessment  Pain Assessment: Faces Faces Pain Scale: Hurts little more Pain Location: L ankle Pain Descriptors / Indicators: Discomfort, Grimacing Pain Intervention(s): Limited activity within patient's tolerance, Monitored during session    Home Living                          Prior Function            PT Goals (current goals can now be found in the care plan section) Acute Rehab PT Goals Patient Stated Goal: to have surgery PT Goal Formulation: With patient/family Time For Goal Achievement: 03/30/24 Potential to Achieve Goals: Fair Progress towards PT goals: Progressing toward goals    Frequency    Min  2X/week      PT Plan      Co-evaluation              AM-PAC PT 6 Clicks Mobility   Outcome Measure  Help needed turning from your back to your side while in a flat bed without using bedrails?: A Lot Help needed moving from lying on your back to sitting on the side of a flat bed without using bedrails?: A Little Help needed moving to and from a bed to a chair (including a wheelchair)?: Total Help needed standing up from a chair using your arms (e.g., wheelchair or bedside chair)?: Total Help needed to walk in hospital room?: Total Help needed climbing 3-5 steps with a railing? : Total 6 Click Score: 9    End of Session Equipment Utilized During Treatment: Gait belt Activity Tolerance: Patient tolerated treatment well Patient left: in bed;with call bell/phone within reach;with family/visitor present;Other (comment) (on bedpan) Nurse Communication: Mobility status;Other (comment) (on bedpan) PT Visit Diagnosis: Unsteadiness on feet (R26.81);History of falling (Z91.81);Muscle weakness (generalized) (M62.81);Difficulty in walking, not elsewhere classified (R26.2)     Time: 8670-8650 PT Time Calculation (min) (ACUTE ONLY): 20 min  Charges:    $Therapeutic Activity: 8-22 mins PT General Charges $$ ACUTE PT VISIT: 1 Visit                      Rachel RAMAN, PT Acute Rehabilitation Services  Office [phone]    Rachel Black 03/20/2024, 2:07 PM

## 2024-03-21 DIAGNOSIS — S82852A Displaced trimalleolar fracture of left lower leg, initial encounter for closed fracture: Secondary | ICD-10-CM | POA: Diagnosis not present

## 2024-03-21 LAB — SURGICAL PCR SCREEN
MRSA, PCR: NEGATIVE
Staphylococcus aureus: NEGATIVE

## 2024-03-21 MED ORDER — FOLIC ACID 1 MG PO TABS
1.0000 mg | ORAL_TABLET | Freq: Every day | ORAL | Status: DC
  Administered 2024-03-21 – 2024-03-28 (×7): 1 mg via ORAL
  Filled 2024-03-21 (×7): qty 1

## 2024-03-21 MED ORDER — VITAMIN B-12 1000 MCG PO TABS
1000.0000 ug | ORAL_TABLET | Freq: Every day | ORAL | Status: DC
  Administered 2024-03-21 – 2024-03-28 (×7): 1000 ug via ORAL
  Filled 2024-03-21 (×7): qty 1

## 2024-03-21 MED ORDER — SODIUM CHLORIDE 0.9 % IV SOLN: INTRAVENOUS | Status: AC

## 2024-03-21 NOTE — Plan of Care (Signed)
  Problem: Activity: Goal: Risk for activity intolerance will decrease Outcome: Progressing   Problem: Elimination: Goal: Will not experience complications related to bowel motility Outcome: Progressing   

## 2024-03-21 NOTE — Plan of Care (Signed)
  Problem: Pain Managment: Goal: General experience of comfort will improve and/or be controlled Outcome: Progressing   Problem: Safety: Goal: Ability to remain free from injury will improve Outcome: Progressing   Problem: Skin Integrity: Goal: Risk for impaired skin integrity will decrease Outcome: Progressing

## 2024-03-21 NOTE — Progress Notes (Signed)
 PROGRESS NOTE    LAURINE KUYPER  FMW:[id number] DOB: Oct 26, 1950 DOA: 03/16/2024 PCP: Wallace Joesph LABOR, PA    Brief Narrative:   Rachel Black is a 73 y.o. female with past medical history significant for HTN, HLD, CVA, DM 2, gout, chronic abdominal pain, IBS, GERD who presented to [facility] ED on 03/16/2024 with left ankle pain following a fall at home.  Patient was following her husband who was opening the door to the carport, feels like she slipped on a wet surface and unable to get up following the fall.  Complaining of left ankle pain.  She has been experiencing diffuse and persistent stomach pain for approximately two years. On July 2nd, she developed acute right-sided abdominal pain radiating from the hip to the back, prompting her hospital visit. A CT scan of the abdomen pelvis with contrast showed no abnormalities and diverticulosis without signs of diverticulitis. She reports has been experiencing dark stools, which she describes as 'pretty dark,' but denies visible blood. She is taking iron supplements and Tylenol , but no NSAIDs. She feels that she has intermittently had these dark stools since her stomach pains been present.  Labs noted hemoglobin to be 12.3 at that time and creatinine 1.31.  Ultimately she was discharged home. She last had a bowel movement was approximately 4 days ago, described as small, ball-like stools. She has been using MiraLAX  and fiber supplements at home to aid with bowel movements without significant response.  She denies feeling lightheaded or dizzy currently with reported low blood pressures.   Patient had been boarding in the ED since 7/3 after having be fall with plans on discharge to a SNF.  Initial x-rays of the left ankle revealed a mildly displaced fracture of the base of the medial malleolus and distal fibular metaphysis with soft tissue swelling.  Orthopedics have been consulted with plan for ORIF 1 week postinjury once swelling subsided.  However noted to have  acute drop in blood pressure down to 83/43 today which seemed to improved with IV fluids.  Labs noted hemoglobin 9.7 with elevated MCV, platelets 144, sodium 133, BUN 32, and creatinine 1.38.  Stool guaiacs were noted to be negative.  TRH consulted for admission.  Assessment & Plan:   Left trimalleolar fracture secondary to fall Patient presents after having a fall at home that possibly occurred due to slippery surface.  X-rays revealed a left trimalleolar fracture.  Dr. Barton of orthopedics had been consulted and recommended surgery in approximately 1 week post injury.  Plan had been for possible discharge to SNF, but due to questionable blood pressure issues and drop in hemoglobin; EDP requested admission. -- Orthopedics following, appreciate assistance -- Percocet 5-325 mg p.o. q6h PRN  moderate pain -- Morphine  0.5 mg IV q2h PRN severe pain -- Robaxin  500 mg p.o. q6h PRN muscle spasms -- PT/OT evaluation, TOC to follow-up for SNF placement -- Orthopedics plans operative management for ankle fracture tomorrow [facility], requesting transfer today to expedite care  Transient hypotension: Resolved Hx HTN Patient has had intermittent low blood pressure since coming in to the hospital.  Lowest  blood pressure noted to be 68/57, but patient appears to be asymptomatic.  Unclear if symptoms are related to this possible drop in hemoglobin or blood pressure cuff positioning. -- BP 116/65 this morning -- Holding home Imdur  and olmesartan  -- Continue monitor BP closely   Macrocytic anemia secondary to B12/folate deficiency Acute on chronic.  Hemoglobin noted to be 9.7, but had  previously been 12.3 when checked on 7/2. Stool guaiacs were noted to be negative.  Patient had been typed and screened in case of need of blood products.  Anemia panel with iron 79, TIBC 309, ferritin 233, folate 4.8, vitamin B12 55. -- Hgb 9.7>9.0>9.2>9.8; stable -- B12 1000 mcg p.o. daily -- Folate 1 mg p.o.  daily -- CBC in a.m.   Chronic abdominal pain History of IBS Acute on chronic.  Patient reports a 2-year history of abdominal pain that acutely worsened on the second for which was her initial reason for evaluation in the ED.  CT scan of the abdomen pelvis with contrast at that time did not reveal any acute abnormality with diverticulosis and no signs of diverticulitis.  Patient makes note that she has been followed by [facility] for her abdominal pain previously. -- Outpatient follow-up with gastroenterology   Irritable bowel syndrome with constipation Patient reports last bowel movement 4 days ago.  She has been taking MiraLAX .  Received lactulose  on admission. -- Continue MiraLAX  daily -- Senokot-as 1 tablet twice daily -- Smog enema prn    Renal insufficiency Creatinine 1.38 on admission.  Baseline creatinine previously had been around 1-1.1. -- Cr 1.38>1.21 -- BMP in am   Mixed hyperlipidemia  -- Crestor  40 m p.o. daily -- Fenofibrate  160 mg p.o. daily   GERD -- Protonix  40 mg p.o. daily -- Pepcid  10 mg p.o. twice daily  Hx Gout -- Allopurinol  3 mg p.o. twice daily  Obesity, class I Body mass index is 31.81 kg/m.    DVT prophylaxis: SCDs Start: 03/19/24 1503    Code Status: Full Code Family Communication: Updated spouse present at bedside this morning  Disposition Plan:  Level of care: Med-Surg Status is: Inpatient Remains inpatient appropriate because: Pending surgical invention for trimalleolar ankle fracture; plan for tomorrow was a long hospital    Consultants:  Orthopedics, Dr. Barton  Procedures:  None  Antimicrobials:  None   Subjective: Patient seen examined bedside, lying in bed.  Spouse present at bedside.  Pain controlled.  Received message from orthopedics, Dr. Barton requesting transfer to [facility] today for planned surgical intervention regarding ankle fracture tomorrow.  Patient with no other complaints or concerns at  this time.  Denies headache, no dizziness, no visual disturbance, no chest pain, no palpitations, no shortness of breath, no abdominal pain, no fever/chills/night sweats, no nausea/vomiting/diarrhea, no focal weakness, no fatigue, no paresthesias.  No acute events overnight per nursing staff.  Objective: Vitals:   03/20/24 2253 03/21/24 0256 03/21/24 0725 03/21/24 1039  BP: 118/63 116/65 95/67   Pulse: 80 72 79   Resp:  19 16   Temp: 98.3 F (36.8 C) 98 F (36.7 C) 98.6 F (37 C) 98.2 F (36.8 C)  TempSrc: Oral Oral Oral Oral  SpO2:  95% 95%   Weight:      Height:        Intake/Output Summary (Last 24 hours) at 03/21/2024 1220 Last data filed at 03/21/2024 0317 Gross per 24 hour  Intake 1391.91 ml  Output 700 ml  Net 691.91 ml   Filed Weights   03/16/24 1005 03/19/24 1347  Weight: 86 kg 89.4 kg    Examination:  Physical Exam: GEN: NAD, alert and oriented x 3, obese HEENT: NCAT, PERRL, EOMI, sclera clear, MMM PULM: CTAB w/o wheezes/crackles, normal respiratory effort on room air CV: RRR w/o M/G/R GI: abd soft, NTND, NABS, no R/G/M MSK: no peripheral edema, left lower extremity with  Ace wrap/splint in place, neurovascular intact, moves all extremities independently NEURO: No focal neurological deficit PSYCH: normal mood/affect Integumentary: dry/intact, no rashes or wounds    Data Reviewed: I have personally reviewed following labs and imaging studies  CBC: Recent Labs  Lab 03/15/24 2030 03/19/24 0857 03/19/24 1340 03/19/24 1840 03/20/24 0256  WBC 7.2 6.8  --   --  4.9  NEUTROABS 5.4  --   --   --   --   HGB 12.3 9.7* 9.0* 9.2* 9.8*  HCT 38.0 29.4* 26.7* 29.0* 30.5*  MCV 101.3* 101.0*  --   --  100.3*  PLT 193 144*  --   --  146*   Basic Metabolic Panel: Recent Labs  Lab 03/15/24 2030 03/19/24 0857 03/20/24 0256  NA 134* 133* 137  K 4.2 3.9 4.3  CL 104 103 107  CO2 19* 20* 22  GLUCOSE 104* 124* 93  BUN 20 32* 24*  CREATININE 1.31* 1.38* 1.21*   CALCIUM  9.7 9.2 9.3   GFR: Estimated Creatinine Clearance: 48 mL/min (A) (by C-G formula based on SCr of 1.21 mg/dL (H)). Liver Function Tests: Recent Labs  Lab 03/15/24 2030 03/19/24 0857  AST 18 31  ALT 13 16  ALKPHOS 43 36*  BILITOT 0.7 0.5  PROT 6.4* 5.3*  ALBUMIN  3.9 2.8*   Recent Labs  Lab 03/15/24 2030  LIPASE 40   No results for input(s): AMMONIA in the last 168 hours. Coagulation Profile: No results for input(s): INR, PROTIME in the last 168 hours. Cardiac Enzymes: No results for input(s): CKTOTAL, CKMB, CKMBINDEX, TROPONINI in the last 168 hours. BNP (last 3 results) No results for input(s): PROBNP in the last 8760 hours. HbA1C: No results for input(s): HGBA1C in the last 72 hours. CBG: No results for input(s): GLUCAP in the last 168 hours. Lipid Profile: No results for input(s): CHOL, HDL, LDLCALC, TRIG, CHOLHDL, LDLDIRECT in the last 72 hours. Thyroid  Function Tests: No results for input(s): TSH, T4TOTAL, FREET4, T3FREE, THYROIDAB in the last 72 hours. Anemia Panel: Recent Labs    03/20/24 0652 03/20/24 0730 03/20/24 0743  VITAMINB12  --   --  55*  FOLATE  --  4.8*  --   FERRITIN  --   --  233  TIBC  --   --  309  IRON  --   --  79  RETICCTPCT 1.9  --   --    Sepsis Labs: No results for input(s): PROCALCITON, LATICACIDVEN in the last 168 hours.  No results found for this or any previous visit (from the past 240 hours).       Radiology Studies: No results found.      Scheduled Meds:  allopurinol   300 mg Oral BID   chlorhexidine   60 mL Topical Once   vitamin B-12  1,000 mcg Oral Daily   DULoxetine   60 mg Oral Daily   famotidine   10 mg Oral BID   fenofibrate   160 mg Oral Daily   ferrous sulfate   325 mg Oral BID   gabapentin   300 mg Oral QHS   pantoprazole   40 mg Oral Daily   polyethylene glycol  17 g Oral Daily   rosuvastatin   40 mg Oral Daily   senna-docusate  1 tablet Oral BID    Continuous Infusions:  [START ON 03/22/2024] sodium chloride       ceFAZolin  (ANCEF ) IV       LOS: 2 days    Time spent: 52 minutes spent on 03/21/2024 caring for  this patient face-to-face including chart review, ordering labs/tests, documenting, discussion with nursing staff, consultants, updating family and interview/physical exam    Camellia PARAS [place], DO [facility] Available via Epic secure chat 7am-7pm After these hours, please refer to coverage provider listed on [email or web address] 03/21/2024, 12:20 PM

## 2024-03-21 NOTE — Progress Notes (Signed)
 Occupational Therapy Treatment Patient Details Name: Rachel Black MRN: [id number] DOB: October 14, 1950 Today's Date: 03/21/2024   History of present illness Pt is a 73 y.o. F who presents after a fall with CT L ankle trimalleolar fx. PMH includes: arthritis, DM, gout, PFO, CVA, HTN, ERCP, TKA bil., CAD   OT comments  Limited session patient concerned about possible bowel movement not wanting to get to the recliner this date.  Surgery is scheduled for 7/9 and patient can be reassessed at that time no significant functional changes.  Patient will benefit from continued inpatient follow up therapy, <3 hours/day.      If plan is discharge home, recommend the following:  A lot of help with walking and/or transfers;A lot of help with bathing/dressing/bathroom;Assistance with cooking/housework;Assist for transportation;Help with stairs or ramp for entrance   Equipment Recommendations       Recommendations for Other Services      Precautions / Restrictions Precautions Precautions: Fall Recall of Precautions/Restrictions: Intact Restrictions Weight Bearing Restrictions Per Provider Order: Yes LLE Weight Bearing Per Provider Order: Non weight bearing       Mobility Bed Mobility   Bed Mobility: Supine to Sit, Sit to Supine, Rolling     Supine to sit: Min assist Sit to supine: Min assist        Transfers                         Balance Overall balance assessment: Needs assistance Sitting-balance support: Feet supported Sitting balance-Leahy Scale: Fair                                       Extremity/Trunk Assessment Upper Extremity Assessment RUE Deficits / Details: Arthritic shoulders with crepitus. AAROM shoulder flexion to 80 degrees.AROM elbow flexion/extension and gross grasp and release WFLs with strength 3+/5. LUE Deficits / Details: Arthritic shoulders with crepitus. AAROM shoulder flexion to 80 degrees.  AROM elbow flexion/extension and gross  grasp and release WFLs with strength 3+/5.   Lower Extremity Assessment Lower Extremity Assessment: Defer to PT evaluation   Cervical / Trunk Assessment Cervical / Trunk Assessment: Other exceptions Cervical / Trunk Exceptions: increased body habitus    Vision Patient Visual Report: No change from baseline     Perception Perception Perception: Not tested   Praxis Praxis Praxis: Not tested   Communication Communication Communication: No apparent difficulties   Cognition Arousal: Alert Behavior During Therapy: Flat affect Cognition: No apparent impairments                               Following commands: Intact        Cueing   Cueing Techniques: Verbal cues  Exercises      Shoulder Instructions       General Comments      Pertinent Vitals/ Pain       Pain Assessment Pain Assessment: Faces Faces Pain Scale: Hurts little more Pain Location: L ankle Pain Descriptors / Indicators: Tender Pain Intervention(s): Premedicated before session  Frequency  Min 2X/week        Progress Toward Goals  OT Goals(current goals can now be found in the care plan section)  Progress towards OT goals: OT to reassess next treatment  Acute Rehab OT Goals OT Goal Formulation: With patient Time For Goal Achievement: 03/31/24 Potential to Achieve Goals: Fair  Plan      Co-evaluation                 AM-PAC OT 6 Clicks Daily Activity     Outcome Measure   Help from another person eating meals?: None Help from another person taking care of personal grooming?: A Little Help from another person toileting, which includes using toliet, bedpan, or urinal?: A Lot Help from another person bathing (including washing, rinsing, drying)?: A Lot Help from another person to put on and taking off regular upper body clothing?: A Little Help from another person to put on and taking  off regular lower body clothing?: A Lot 6 Click Score: 16    End of Session    OT Visit Diagnosis: Unsteadiness on feet (R26.81);Other abnormalities of gait and mobility (R26.89);Muscle weakness (generalized) (M62.81);Pain Pain - Right/Left: Left Pain - part of body: Ankle and joints of foot   Activity Tolerance Other (comment) (limited due to possible BM)   Patient Left with call bell/phone within reach;in bed;with family/visitor present   Nurse Communication Mobility status        Time: 9149-9092 OT Time Calculation (min): 17 min  Charges: OT General Charges $OT Visit: 1 Visit OT Treatments $Therapeutic Activity: 8-22 mins  03/21/2024  RP, OTR/L  Acute Rehabilitation Services  Office:  [phone]   Rachel Black 03/21/2024, 9:11 AM

## 2024-03-21 NOTE — TOC CAGE-AID Note (Signed)
 Transition of Care [facility]) - CAGE-AID Screening   Patient Details  Name: Rachel Black MRN: [id number] Date of Birth: 1951/06/19  Transition of [facility]) CM/SW Contact:    Lauraine FORBES Saa, LCSW Phone Number: 03/21/2024, 12:49 PM   Clinical Narrative:  12:49 PM Per chart review, patient is not fully oriented.  CAGE-AID Screening: Substance Abuse Screening unable to be completed due to: : Patient unable to participate             Substance Abuse Education Offered: No

## 2024-03-21 NOTE — Progress Notes (Signed)
 Pt being transferred to [facility]. Report called and given to TK, RN. [facility] at bedside to transport pt. Personal belongings taken by pt's Spouse Christopher. Requested paperwork given to [facility] staff. Pt transported off unit via stretcher with [facility].

## 2024-03-22 ENCOUNTER — Inpatient Hospital Stay (HOSPITAL_COMMUNITY): Admitting: Registered Nurse

## 2024-03-22 ENCOUNTER — Other Ambulatory Visit: Payer: Self-pay

## 2024-03-22 ENCOUNTER — Encounter (HOSPITAL_COMMUNITY): Payer: Self-pay | Admitting: Internal Medicine

## 2024-03-22 ENCOUNTER — Inpatient Hospital Stay (HOSPITAL_COMMUNITY)

## 2024-03-22 ENCOUNTER — Other Ambulatory Visit (HOSPITAL_COMMUNITY): Payer: Self-pay

## 2024-03-22 ENCOUNTER — Encounter (HOSPITAL_COMMUNITY)
Admission: EM | Disposition: A | Discharge status: Skilled Nursing Facility | Payer: Self-pay | Source: Home / Self Care | Attending: Internal Medicine

## 2024-03-22 DIAGNOSIS — S82852A Displaced trimalleolar fracture of left lower leg, initial encounter for closed fracture: Secondary | ICD-10-CM | POA: Diagnosis not present

## 2024-03-22 HISTORY — PX: SYNDESMOSIS REPAIR: SHX5182

## 2024-03-22 HISTORY — PX: ORIF ANKLE FRACTURE: SHX5408

## 2024-03-22 LAB — COMPREHENSIVE METABOLIC PANEL WITH GFR
ALT: 15 U/L (ref 0–44)
AST: 20 U/L (ref 15–41)
Albumin: 3.1 g/dL — ABNORMAL LOW (ref 3.5–5.0)
Alkaline Phosphatase: 45 U/L (ref 38–126)
Anion gap: 10 (ref 5–15)
BUN: 22 mg/dL (ref 8–23)
CO2: 22 mmol/L (ref 22–32)
Calcium: 9.6 mg/dL (ref 8.9–10.3)
Chloride: 105 mmol/L (ref 98–111)
Creatinine, Ser: 1.39 mg/dL — ABNORMAL HIGH (ref 0.44–1.00)
GFR, Estimated: 41 mL/min — ABNORMAL LOW (ref >60)
Glucose, Bld: 178 mg/dL — ABNORMAL HIGH (ref 70–99)
Potassium: 4.3 mmol/L (ref 3.5–5.1)
Sodium: 137 mmol/L (ref 135–145)
Total Bilirubin: 0.4 mg/dL (ref 0.0–1.2)
Total Protein: 5.7 g/dL — ABNORMAL LOW (ref 6.5–8.1)

## 2024-03-22 LAB — BASIC METABOLIC PANEL WITH GFR
Anion gap: 6 (ref 5–15)
BUN: 25 mg/dL — ABNORMAL HIGH (ref 8–23)
CO2: 23 mmol/L (ref 22–32)
Calcium: 9 mg/dL (ref 8.9–10.3)
Chloride: 105 mmol/L (ref 98–111)
Creatinine, Ser: 1.23 mg/dL — ABNORMAL HIGH (ref 0.44–1.00)
GFR, Estimated: 47 mL/min — ABNORMAL LOW (ref >60)
Glucose, Bld: 103 mg/dL — ABNORMAL HIGH (ref 70–99)
Potassium: 4 mmol/L (ref 3.5–5.1)
Sodium: 134 mmol/L — ABNORMAL LOW (ref 135–145)

## 2024-03-22 LAB — CBC
HCT: 27.6 % — ABNORMAL LOW (ref 36.0–46.0)
Hemoglobin: 8.7 g/dL — ABNORMAL LOW (ref 12.0–15.0)
MCH: 31.8 pg (ref 26.0–34.0)
MCHC: 31.5 g/dL (ref 30.0–36.0)
MCV: 100.7 fL — ABNORMAL HIGH (ref 80.0–100.0)
Platelets: 156 K/uL (ref 150–400)
RBC: 2.74 MIL/uL — ABNORMAL LOW (ref 3.87–5.11)
RDW: 12.9 % (ref 11.5–15.5)
WBC: 5.7 K/uL (ref 4.0–10.5)
nRBC: 0 % (ref 0.0–0.2)

## 2024-03-22 LAB — GLUCOSE, CAPILLARY: Glucose-Capillary: 103 mg/dL — ABNORMAL HIGH (ref 70–99)

## 2024-03-22 SURGERY — OPEN REDUCTION INTERNAL FIXATION (ORIF) ANKLE FRACTURE
Anesthesia: General | Site: Ankle | Laterality: Left

## 2024-03-22 MED ORDER — ROPIVACAINE HCL 5 MG/ML IJ SOLN
INTRAMUSCULAR | Status: DC | PRN
Start: 2024-03-22 — End: 2024-03-22
  Administered 2024-03-22: 50 mL via PERINEURAL

## 2024-03-22 MED ORDER — CHLORHEXIDINE GLUCONATE 4 % EX SOLN
60.0000 mL | Freq: Once | CUTANEOUS | Status: AC
  Administered 2024-03-22: 4 via TOPICAL

## 2024-03-22 MED ORDER — PHENYLEPHRINE 80 MCG/ML (10ML) SYRINGE FOR IV PUSH (FOR BLOOD PRESSURE SUPPORT)
PREFILLED_SYRINGE | INTRAVENOUS | Status: AC
  Filled 2024-03-22: qty 10

## 2024-03-22 MED ORDER — DEXAMETHASONE SODIUM PHOSPHATE 10 MG/ML IJ SOLN
INTRAMUSCULAR | Status: DC | PRN
  Administered 2024-03-22 (×2): 5 mg via INTRAVENOUS

## 2024-03-22 MED ORDER — FENTANYL CITRATE (PF) 100 MCG/2ML IJ SOLN
INTRAMUSCULAR | Status: AC
  Filled 2024-03-22: qty 2

## 2024-03-22 MED ORDER — HYDROMORPHONE HCL 1 MG/ML IJ SOLN: 0.2500 mg | INTRAMUSCULAR | Status: DC | PRN

## 2024-03-22 MED ORDER — EPHEDRINE 5 MG/ML INJ
INTRAVENOUS | Status: AC
  Filled 2024-03-22: qty 5

## 2024-03-22 MED ORDER — OXYCODONE HCL 5 MG/5ML PO SOLN: 5.0000 mg | Freq: Once | ORAL | Status: DC | PRN

## 2024-03-22 MED ORDER — MIDAZOLAM HCL 2 MG/2ML IJ SOLN
1.0000 mg | Freq: Once | INTRAMUSCULAR | Status: AC
  Administered 2024-03-22: 1 mg via INTRAVENOUS
  Filled 2024-03-22: qty 2

## 2024-03-22 MED ORDER — CHLORHEXIDINE GLUCONATE 0.12 % MT SOLN
15.0000 mL | Freq: Once | OROMUCOSAL | Status: AC
  Administered 2024-03-22: 15 mL via OROMUCOSAL

## 2024-03-22 MED ORDER — ONDANSETRON 4 MG PO TBDP
4.0000 mg | ORAL_TABLET | Freq: Three times a day (TID) | ORAL | 0 refills | Status: DC
  Filled 2024-03-22: qty 15, 5d supply, fill #0

## 2024-03-22 MED ORDER — FENTANYL CITRATE PF 50 MCG/ML IJ SOSY
50.0000 ug | PREFILLED_SYRINGE | Freq: Once | INTRAMUSCULAR | Status: AC
  Administered 2024-03-22: 100 ug via INTRAVENOUS
  Filled 2024-03-22: qty 2

## 2024-03-22 MED ORDER — CEFAZOLIN SODIUM-DEXTROSE 1-4 GM/50ML-% IV SOLN
1.0000 g | Freq: Three times a day (TID) | INTRAVENOUS | Status: AC
  Administered 2024-03-22 – 2024-03-23 (×3): 1 g via INTRAVENOUS
  Filled 2024-03-22 (×3): qty 50

## 2024-03-22 MED ORDER — ONDANSETRON HCL 4 MG/2ML IJ SOLN
INTRAMUSCULAR | Status: DC | PRN
  Administered 2024-03-22: 4 mg via INTRAVENOUS

## 2024-03-22 MED ORDER — VANCOMYCIN HCL 1000 MG IV SOLR
INTRAVENOUS | Status: DC | PRN
  Administered 2024-03-22: 1000 mg via TOPICAL

## 2024-03-22 MED ORDER — LACTATED RINGERS IV SOLN: INTRAVENOUS | Status: DC

## 2024-03-22 MED ORDER — FENTANYL CITRATE (PF) 100 MCG/2ML IJ SOLN
INTRAMUSCULAR | Status: DC | PRN
  Administered 2024-03-22: 25 ug via INTRAVENOUS

## 2024-03-22 MED ORDER — ONDANSETRON HCL 4 MG/2ML IJ SOLN
INTRAMUSCULAR | Status: AC
  Filled 2024-03-22: qty 2

## 2024-03-22 MED ORDER — EPHEDRINE SULFATE-NACL 50-0.9 MG/10ML-% IV SOSY
PREFILLED_SYRINGE | INTRAVENOUS | Status: DC | PRN
  Administered 2024-03-22: 5 mg via INTRAVENOUS

## 2024-03-22 MED ORDER — PROPOFOL 10 MG/ML IV BOLUS
INTRAVENOUS | Status: DC | PRN
  Administered 2024-03-22: 200 mg via INTRAVENOUS

## 2024-03-22 MED ORDER — OXYCODONE HCL 5 MG PO TABS
ORAL_TABLET | ORAL | 0 refills | Status: DC
  Filled 2024-03-22: qty 40, 7d supply, fill #0

## 2024-03-22 MED ORDER — SODIUM CHLORIDE 0.9 % IV SOLN: 12.5000 mg | INTRAVENOUS | Status: DC | PRN

## 2024-03-22 MED ORDER — VANCOMYCIN HCL 1000 MG IV SOLR
INTRAVENOUS | Status: AC
  Filled 2024-03-22: qty 20

## 2024-03-22 MED ORDER — OXYCODONE HCL 5 MG PO TABS: 5.0000 mg | ORAL_TABLET | ORAL | 0 refills | Status: DC | PRN

## 2024-03-22 MED ORDER — 0.9 % SODIUM CHLORIDE (POUR BTL) OPTIME
TOPICAL | Status: DC | PRN
  Administered 2024-03-22: 1000 mL

## 2024-03-22 MED ORDER — CEFAZOLIN SODIUM-DEXTROSE 2-4 GM/100ML-% IV SOLN
2.0000 g | INTRAVENOUS | Status: AC
  Administered 2024-03-22: 2 g via INTRAVENOUS
  Filled 2024-03-22: qty 100

## 2024-03-22 MED ORDER — LIDOCAINE HCL (PF) 2 % IJ SOLN
INTRAMUSCULAR | Status: DC | PRN
  Administered 2024-03-22: 40 mg via INTRADERMAL

## 2024-03-22 MED ORDER — ASPIRIN 81 MG PO TBEC
81.0000 mg | DELAYED_RELEASE_TABLET | Freq: Two times a day (BID) | ORAL | 0 refills | Status: DC
  Filled 2024-03-22: qty 84, 42d supply, fill #0

## 2024-03-22 MED ORDER — PROPOFOL 10 MG/ML IV BOLUS
INTRAVENOUS | Status: AC
Start: 2024-03-22 — End: 2024-03-22
  Filled 2024-03-22: qty 20

## 2024-03-22 MED ORDER — OXYCODONE HCL 5 MG PO TABS: 5.0000 mg | ORAL_TABLET | Freq: Once | ORAL | Status: DC | PRN

## 2024-03-22 MED ORDER — AMISULPRIDE (ANTIEMETIC) 5 MG/2ML IV SOLN: 10.0000 mg | Freq: Once | INTRAVENOUS | Status: DC | PRN

## 2024-03-22 MED ORDER — PHENYLEPHRINE HCL-NACL 20-0.9 MG/250ML-% IV SOLN
INTRAVENOUS | Status: DC | PRN
  Administered 2024-03-22: 20 ug/min via INTRAVENOUS

## 2024-03-22 MED ORDER — PHENYLEPHRINE 80 MCG/ML (10ML) SYRINGE FOR IV PUSH (FOR BLOOD PRESSURE SUPPORT)
PREFILLED_SYRINGE | INTRAVENOUS | Status: DC | PRN
  Administered 2024-03-22 (×4): 160 ug via INTRAVENOUS

## 2024-03-22 MED ORDER — DOCUSATE SODIUM 100 MG PO CAPS
100.0000 mg | ORAL_CAPSULE | Freq: Two times a day (BID) | ORAL | 0 refills | Status: DC
  Filled 2024-03-22: qty 56, 28d supply, fill #0

## 2024-03-22 SURGICAL SUPPLY — 54 items
BAG COUNTER SPONGE SURGICOUNT (BAG) IMPLANT
BAG ZIPLOCK 12X15 (MISCELLANEOUS) ×2 IMPLANT
BANDAGE ESMARK 6X9 LF (GAUZE/BANDAGES/DRESSINGS) ×2 IMPLANT
BIT DRILL 2.4 AO COUPLING CANN (BIT) IMPLANT
BLADE SURG 15 STRL LF DISP TIS (BLADE) ×4 IMPLANT
BNDG COHESIVE 6X5 TAN ST LF (GAUZE/BANDAGES/DRESSINGS) ×2 IMPLANT
BNDG ELASTIC 4INX 5YD STR LF (GAUZE/BANDAGES/DRESSINGS) ×2 IMPLANT
BNDG ELASTIC 6INX 5YD STR LF (GAUZE/BANDAGES/DRESSINGS) ×2 IMPLANT
BNDG ELASTIC 6X10 VLCR STRL LF (GAUZE/BANDAGES/DRESSINGS) IMPLANT
BNDG GAUZE DERMACEA FLUFF 4 (GAUZE/BANDAGES/DRESSINGS) ×2 IMPLANT
CHLORAPREP W/TINT 26 (MISCELLANEOUS) ×4 IMPLANT
COVER SURGICAL LIGHT HANDLE (MISCELLANEOUS) ×2 IMPLANT
CUFF TRNQT CYL 34X4.125X (TOURNIQUET CUFF) ×2 IMPLANT
DRAPE C-ARM 42X120 X-RAY (DRAPES) IMPLANT
DRAPE C-ARMOR (DRAPES) ×2 IMPLANT
DRAPE EXTREMITY T 121X128X90 (DISPOSABLE) ×2 IMPLANT
DRAPE U-SHAPE 47X51 STRL (DRAPES) ×2 IMPLANT
DRIVER RETENTION T15 LONG (ORTHOPEDIC DISPOSABLE SUPPLIES) IMPLANT
DRSG MEPITEL 4X7.2 (GAUZE/BANDAGES/DRESSINGS) IMPLANT
ELECT PENCIL ROCKER SW 15FT (MISCELLANEOUS) IMPLANT
ELECT REM PT RETURN 15FT ADLT (MISCELLANEOUS) ×2 IMPLANT
GAUZE 4X4 16PLY ~~LOC~~+RFID DBL (SPONGE) ×2 IMPLANT
GAUZE PAD ABD 8X10 STRL (GAUZE/BANDAGES/DRESSINGS) ×10 IMPLANT
GAUZE SPONGE 4X4 12PLY STRL (GAUZE/BANDAGES/DRESSINGS) ×4 IMPLANT
GAUZE XEROFORM 1X8 LF (GAUZE/BANDAGES/DRESSINGS) ×2 IMPLANT
GLOVE BIOGEL PI IND STRL 7.5 (GLOVE) ×4 IMPLANT
GLOVE INDICATOR 7.5 STRL GRN (GLOVE) ×2 IMPLANT
GOWN STRL REUS W/ TWL LRG LVL3 (GOWN DISPOSABLE) ×2 IMPLANT
KIT BASIN OR (CUSTOM PROCEDURE TRAY) ×2 IMPLANT
KIT TURNOVER KIT A (KITS) ×2 IMPLANT
KWIRE ALPS MXV 1.6X6 ZI (WIRE) IMPLANT
KWIRE TROC 1.25X150 (WIRE) IMPLANT
NS IRRIG 1000ML POUR BTL (IV SOLUTION) ×2 IMPLANT
PACK TOTAL JOINT (CUSTOM PROCEDURE TRAY) ×2 IMPLANT
PADDING CAST SYNTHETIC 6X4 NS (CAST SUPPLIES) IMPLANT
PLATE 1/3 TUBULAR 6H (Plate) IMPLANT
PROTECTOR NERVE ULNAR (MISCELLANEOUS) ×2 IMPLANT
SCREW LOCK MDS 3.5X12 (Screw) IMPLANT
SCREW LOCK MDS 3.5X14 (Screw) IMPLANT
SCREW NLOCK 4X46 (Screw) IMPLANT
SCREW NLOCK 4X55 (Screw) IMPLANT
SCREW NON-LOCK 3.5X12 (Screw) IMPLANT
SCREW PART THREADED 4.0X70 (Screw) IMPLANT
SPLINT FIBERGLASS 4X30 (CAST SUPPLIES) IMPLANT
STAPLER SKIN PROX 35W (STAPLE) IMPLANT
STOCKINETTE 4X48 STRL (DRAPES) ×2 IMPLANT
STRAP ANKLE DISTRACTOR (MISCELLANEOUS) ×2 IMPLANT
SUCTION TUBE FRAZIER 10FR DISP (SUCTIONS) ×2 IMPLANT
SUT ETHILON 3 0 PS 1 (SUTURE) ×4 IMPLANT
SUT VIC AB 0 CT1 36 (SUTURE) ×2 IMPLANT
SUT VIC AB 2-0 CT2 27 (SUTURE) IMPLANT
SUT VIC AB 2-0 SH 27XBRD (SUTURE) ×2 IMPLANT
SUT VIC AB 3-0 SH 27X BRD (SUTURE) ×4 IMPLANT
WATER STERILE IRR 1000ML POUR (IV SOLUTION) ×2 IMPLANT

## 2024-03-22 NOTE — H&P (Signed)

## 2024-03-22 NOTE — Progress Notes (Signed)
 Progress Note   Patient: Rachel Black FMW:[id number] DOB: 1950/11/20 DOA: 03/16/2024     3 DOS: the patient was seen and examined on 03/22/2024   Brief hospital course: Rachel Black is a 73 y.o. female with past medical history significant for HTN, HLD, CVA, DM 2, gout, chronic abdominal pain, IBS, GERD who presented to [facility] ED on 03/16/2024 with left ankle pain following a fall at home.  Patient was following her husband who was opening the door to the carport, feels like she slipped on a wet surface and unable to get up following the fall.  Complaining of left ankle pain. She was found to have a left trimalleolar fracture secondary to fall and being planned for surgical procedure 7/9.  Assessment and Plan:   Left trimalleolar fracture secondary to fall Patient presents after having a fall at home that possibly occurred due to slippery surface.  X-rays revealed a left trimalleolar fracture.   -Dr. Barton taking this patient to the OR 7/9.  -Percocet 5-325 mg p.o. q6h PRN  moderate pain -- Morphine  0.5 mg IV q2h PRN severe pain -- Robaxin  500 mg p.o. q6h PRN muscle spasms -- PT/OT evaluation, TOC to follow-up for SNF placement -- Further per orthopedics   Transient hypotension: Resolved Hx HTN Patient has had intermittent low blood pressure since coming in to the hospital.  Lowest  blood pressure noted to be 68/57, but patient appears to be asymptomatic.  Unclear if symptoms are related to this possible drop in hemoglobin or blood pressure cuff positioning. -- BP 105/51 this morning -- Holding home Imdur  and olmesartan  -- Continue IV fluids for another 24 hours -- Continue monitor BP closely   Macrocytic anemia Acute on chronic.  Hemoglobin noted to be 9.7, but had previously been 12.3 when checked on 7/2. Stool guaiacs were noted to be negative.  Patient had been typed and screened in case of need of blood products. -- Anemia panel: Pending -- Hgb 9.7>9.0>9.2>9.8; stable --  Repeat H&H in a.m.   Chronic abdominal pain History of IBS Acute on chronic.  Patient reports a 2-year history of abdominal pain that acutely worsened on the second for which was her initial reason for evaluation in the ED.  CT scan of the abdomen pelvis with contrast at that time did not reveal any acute abnormality with diverticulosis and no signs of diverticulitis.  Patient makes note that she has been followed by [facility] for her abdominal pain previously. -- Outpatient follow-up with gastroenterology   Irritable bowel syndrome with constipation Patient reports last bowel movement 4 days ago.  She has been taking MiraLAX .  Received lactulose  on admission. -- Continue MiraLAX  daily -- Senokot-as 1 tablet twice daily -- Smog enema prn    Renal insufficiency Acute.  Creatinine noted to be elevated up to 1.38 with BUN 32.  Baseline creatinine previously had been around 1-1.1. - Recheck kidney function in a.m.   Mixed hyperlipidemia  -- Crestor  40 m p.o. daily -- Fenofibrate  160 mg p.o. daily   GERD -- Protonix  40 mg p.o. daily -- Pepcid  10 mg p.o. twice daily   Hx Gout -- Allopurinol  3 mg p.o. twice daily   Obesity, class I Body mass index is 31.81 kg/m.       DVT prophylaxis: SCDs Start: 03/19/24 1503     Code Status: Full Code     Subjective: Complaints of pain on the left lower extremity  Physical Exam: Vitals:   03/22/24 1329 03/22/24  1626 03/22/24 1632 03/22/24 1634  BP: 104/68 (!) 83/38 113/70 110/60  Pulse: 78 71 80 74  Resp: 16 17    Temp: 97.8 F (36.6 C) 98 F (36.7 C)    TempSrc: Oral Oral    SpO2: 98% 90%    Weight:      Height:       Constitutional: Alert, awake, calm, comfortable HEENT: Neck supple Respiratory: clear to auscultation bilaterally, no wheezing, no crackles. Normal respiratory effort. No accessory muscle use.  Cardiovascular: Regular rate and rhythm, no murmurs / rubs / gallops. No extremity edema. 2+ pedal pulses. No carotid  bruits.  Abdomen: no tenderness, no masses palpated. No hepatosplenomegaly. Bowel sounds positive.  Musculoskeletal: no clubbing / cyanosis. No joint deformity upper and lower extremities. Good ROM, no contractures. Normal muscle tone.  Skin: no rashes, lesions, ulcers. No induration Neurologic: CN 2-12 grossly intact. Sensation intact, DTR normal. Strength 5/5 x all 4 extremities.  Psychiatric: Normal judgment and insight. Alert and oriented x 3. Normal mood.   Data Reviewed:  Hemoglobin 8.7, hematocrit 27.6 sodium 134, potassium 4, BUN 25, creatinine 1.23  Family Communication: None available  Disposition: Status is: Inpatient Remains inpatient appropriate because: Going to our for left trimalleolar fracture 7/9  Planned Discharge Destination: Home with Home Health    Time spent: 35 minutes  Author: Nena Rebel, MD 03/22/2024 5:11 PM  For on call review [email or web address].

## 2024-03-22 NOTE — Progress Notes (Signed)
 Attempted to have patient sign consent for her surgery on 7/9.  Patient and husband state that they don't remember anyone specifically explaining the procedure with them and wanted to wait until they spoke with the surgeon before signing.

## 2024-03-22 NOTE — Anesthesia Preprocedure Evaluation (Signed)
 Anesthesia Evaluation  Patient identified by MRN, date of birth, ID band Patient awake    Reviewed: Allergy  & Precautions, NPO status , Patient's Chart, lab work & pertinent test results  Airway Mallampati: II  TM Distance: <3 FB Neck ROM: Full    Dental no notable dental hx.    Pulmonary neg pulmonary ROS   breath sounds clear to auscultation + decreased breath sounds      Cardiovascular hypertension, Pt. on medications Normal cardiovascular exam Rhythm:Regular Rate:Normal     Neuro/Psych   Anxiety     negative neurological ROS  negative psych ROS   GI/Hepatic Neg liver ROS,GERD  ,,  Endo/Other  diabetes, Type 2  Class 3 obesity  Renal/GU Renal InsufficiencyRenal disease  negative genitourinary   Musculoskeletal  (+) Arthritis , Osteoarthritis,    Abdominal  (+) + obese  Peds negative pediatric ROS (+)  Hematology  (+) Blood dyscrasia, anemia   Anesthesia Other Findings   Reproductive/Obstetrics negative OB ROS                              Anesthesia Physical Anesthesia Plan  ASA: III  Anesthesia Plan: General   Post-op Pain Management: Regional block*, Gabapentin  PO (pre-op)* and Minimal or no pain anticipated   Induction: Intravenous  PONV Risk Score and Plan: 3 and Ondansetron , Dexamethasone , Midazolam  and Treatment may vary due to age or medical condition  Airway Management Planned: LMA  Additional Equipment:   Intra-op Plan:   Post-operative Plan: Extubation in OR  Informed Consent: I have reviewed the patients History and Physical, chart, labs and discussed the procedure including the risks, benefits and alternatives for the proposed anesthesia with the patient or authorized representative who has indicated his/her understanding and acceptance.     Dental advisory given  Plan Discussed with: CRNA and Surgeon  Anesthesia Plan Comments:          Anesthesia  Quick Evaluation

## 2024-03-22 NOTE — Anesthesia Procedure Notes (Signed)
 Anesthesia Regional Block: Popliteal block   Pre-Anesthetic Checklist: , timeout performed,  Correct Patient, Correct Site, Correct Laterality,  Correct Procedure, Correct Position, site marked,  Risks and benefits discussed,  Surgical consent,  Pre-op evaluation,  At surgeon's request and post-op pain management  Laterality: Left  Prep: chloraprep       Needles:  Injection technique: Single-shot  Needle Type: Stimiplex     Needle Length: 9cm  Needle Gauge: 21     Additional Needles:   Procedures:,,,, ultrasound used (permanent image in chart),,    Narrative:  Start time: 03/22/2024 10:15 AM End time: 03/22/2024 10:20 AM Injection made incrementally with aspirations every 5 mL.  Performed by: Personally  Anesthesiologist: Cleotilde Butler Dade, MD

## 2024-03-22 NOTE — Anesthesia Procedure Notes (Signed)
 Anesthesia Regional Block: Adductor canal block   Pre-Anesthetic Checklist: , timeout performed,  Correct Patient, Correct Site, Correct Laterality,  Correct Procedure, Correct Position, site marked,  Risks and benefits discussed,  Surgical consent,  Pre-op evaluation,  At surgeon's request and post-op pain management  Laterality: Left  Prep: chloraprep       Needles:  Injection technique: Single-shot  Needle Type: Stimiplex     Needle Length: 9cm  Needle Gauge: 21     Additional Needles:   Procedures:,,,, ultrasound used (permanent image in chart),,    Narrative:  Start time: 03/22/2024 10:14 AM End time: 03/22/2024 10:19 AM Injection made incrementally with aspirations every 5 mL.  Performed by: Personally  Anesthesiologist: Cleotilde Butler Dade, MD

## 2024-03-22 NOTE — Progress Notes (Addendum)
 Physical Therapy Treatment Patient Details Name: Rachel Black MRN: [id number] DOB: [birth year] Today's Date: 03/22/2024   History of Present Illness Pt is a 73 y.o. F who presents after a fall with CT L ankle trimalleolar fx. PMH includes: arthritis, DM, gout, PFO, CVA, HTN, ERCP, TKA bil., CAD. Pt underwent L ankle ORIF on 03/22/2024 pt remains strict L LE NWB, in short leg splint and recommendations for elevating LLE.     PT Comments   [name] is a 73 y.o. female POD 0 s/p L ankle ORIF. Patient is now limited by functional impairments (see PT problem list below) and requires min A for bed mobility and max A  x 2 for transfers. Patient was unable to safely ambulate at time of tx session with pt exhibiting difficulty with recall and observation of LLE NWB status. Patient will benefit from continued skilled PT interventions to address impairments and progress towards PLOF. Acute PT will follow to progress mobility in preparation for safe discharge to the next venue. Patient will benefit from continued inpatient follow up therapy, <3 hours/day. Pt left seated in recliner, all needs in place, L LE elevated on pillows and spouse present.      If plan is discharge home, recommend the following: Two people to help with walking and/or transfers;Two people to help with bathing/dressing/bathroom;Assistance with cooking/housework;Assist for transportation;Help with stairs or ramp for entrance   Can travel by private vehicle     No  Equipment Recommendations  BSC/3in1;Wheelchair (measurements PT);Wheelchair cushion (measurements PT) (drop arm bedside commode, slide board)    Recommendations for Other Services       Precautions / Restrictions Precautions Precautions: Fall Recall of Precautions/Restrictions: Intact Precaution/Restrictions Comments: difficulty with recall and observation of L LE NWB status Splint/Cast - Date Prophylactic Dressing Applied (if applicable):  03/22/24 Restrictions Weight Bearing Restrictions Per Provider Order: Yes LLE Weight Bearing Per Provider Order: Non weight bearing     Mobility  Bed Mobility Overal bed mobility: Needs Assistance Bed Mobility: Supine to Sit     Supine to sit: HOB elevated, Used rails, Min assist     General bed mobility comments: min A for L LE to EOB and for trunk, cues for scooting anteriorly    Transfers Overall transfer level: Needs assistance Equipment used: Rolling walker (2 wheels), None Transfers: Bed to chair/wheelchair/BSC, Sit to/from Stand Sit to Stand: Max assist, +2 physical assistance, +2 safety/equipment, From elevated surface          Lateral/Scoot Transfers: Max assist, +2 physical assistance, +2 safety/equipment, From elevated surface General transfer comment: max A  x 2 for sit to stand from elevated EOB to RW, pt abe to tolerate 55s standing with B UE support constant cues for LLE NWB with PTs foot placed under pts and min A x 2 for balance, multimodal cues and increased time with max A x 2 to scoot R from elevated EOB to recliner with drop arm and PT managing L LE to ensure L LE NWB    Ambulation/Gait               General Gait Details: NT   Stairs             Wheelchair Mobility     Tilt Bed    Modified Rankin (Stroke Patients Only)       Balance Overall balance assessment: Needs assistance Sitting-balance support: Feet supported Sitting balance-Leahy Scale: Fair     Standing balance support: Bilateral upper extremity supported,  Reliant on assistive device for balance Standing balance-Leahy Scale: Zero Standing balance comment: Needs BUE support and therapist assist to maintain NWBing on the LLE                            Communication Communication Communication: No apparent difficulties  Cognition Arousal: Alert Behavior During Therapy: Flat affect   PT - Cognitive impairments: Problem solving, Safety/Judgement, Memory,  Awareness, Initiation, Sequencing                       PT - Cognition Comments: slow to respond to ?s and commands; unaware of NWB status and surgery on 7/9 Following commands: Intact      Cueing    Exercises      General Comments        Pertinent Vitals/Pain Pain Assessment Pain Assessment: Faces Faces Pain Scale: Hurts little more Breathing: normal Negative Vocalization: none Facial Expression: smiling or inexpressive Body Language: relaxed Consolability: no need to console PAINAD Score: 0 Pain Location: L ankle Pain Descriptors / Indicators: Tender, Sore Pain Intervention(s): Limited activity within patient's tolerance, Premedicated before session, Monitored during session, Repositioned    Home Living                          Prior Function            PT Goals (current goals can now be found in the care plan section) Acute Rehab PT Goals Patient Stated Goal: to have surgery PT Goal Formulation: With patient/family Time For Goal Achievement: 03/30/24 Potential to Achieve Goals: Fair Progress towards PT goals: Progressing toward goals    Frequency    Min 2X/week      PT Plan      Co-evaluation              AM-PAC PT 6 Clicks Mobility   Outcome Measure  Help needed turning from your back to your side while in a flat bed without using bedrails?: A Lot Help needed moving from lying on your back to sitting on the side of a flat bed without using bedrails?: A Little Help needed moving to and from a bed to a chair (including a wheelchair)?: Total Help needed standing up from a chair using your arms (e.g., wheelchair or bedside chair)?: Total Help needed to walk in hospital room?: Total Help needed climbing 3-5 steps with a railing? : Total 6 Click Score: 9    End of Session Equipment Utilized During Treatment: Gait belt Activity Tolerance: Patient limited by fatigue Patient left: with call bell/phone within reach;with  family/visitor present;in chair Nurse Communication: Mobility status;Weight bearing status PT Visit Diagnosis: Unsteadiness on feet (R26.81);History of falling (Z91.81);Muscle weakness (generalized) (M62.81);Difficulty in walking, not elsewhere classified (R26.2)     Time: 8357-8298 PT Time Calculation (min) (ACUTE ONLY): 19 min  Charges:    $Therapeutic Activity: 8-22 mins PT General Charges $$ ACUTE PT VISIT: 1 Visit                     [place], PT Acute Rehab    [place] VEAR Drone 03/22/2024, 6:08 PM

## 2024-03-22 NOTE — Plan of Care (Signed)
  Problem: Clinical Measurements: Goal: Will remain free from infection Outcome: Progressing Goal: Diagnostic test results will improve Outcome: Progressing   Problem: Pain Managment: Goal: General experience of comfort will improve and/or be controlled Outcome: Progressing

## 2024-03-22 NOTE — Op Note (Addendum)
 03/16/2024 - 03/22/2024  11:55 AM   PATIENT: Rachel Black  73 y.o. female  MRN: [id number]   PRE-OPERATIVE DIAGNOSIS:   Closed trimalleolar fracture of left ankle, initial encounter   POST-OPERATIVE DIAGNOSIS:   Same   PROCEDURE: 1] Left trimalleolar ankle open reduction internal fixation without internal fixation of posterior malleolus 2] Left ankle syndesmosis open reduction internal fixation   SURGEON:  Lillia Mountain, MD   ASSISTANT: None   ANESTHESIA: General, regional   EBL: Minimal   TOURNIQUET:    Total Tourniquet Time Documented: Thigh (Left) - 42 minutes Total: Thigh (Left) - 42 minutes    COMPLICATIONS: None apparent   DISPOSITION: Extubated, awake and stable to recovery.   INDICATION FOR PROCEDURE: The patient presented with above diagnosis.  We discussed the diagnosis, alternative treatment options, risks and benefits of the above surgical intervention, as well as alternative non-operative treatments. All questions/concerns were addressed and the patient/family demonstrated appropriate understanding of the diagnosis, the procedure, the postoperative course, and overall prognosis. The patient wished to proceed with surgical intervention and signed an informed surgical consent as such, in each others presence prior to surgery.   PROCEDURE IN DETAIL: After preoperative consent was obtained and the correct operative site was identified, the patient was brought to the operating room supine on stretcher and transferred onto operating table. General anesthesia was induced. Preoperative antibiotics were administered. Surgical timeout was taken. The patient was then positioned supine with an ipsilateral hip bump. The operative lower extremity was prepped and draped in standard sterile fashion with a tourniquet around the thigh. The extremity was exsanguinated and the tourniquet was inflated to 275 mmHg.  A standard lateral incision was made over the  distal fibula. Dissection was carried down to the level of the fibula and the fracture site identified. The superficial peroneal nerve was identified and protected throughout the procedure. The fibula was noted to be shortened with interposed periosteum. The fibula was brought out to length. The fibula fracture was debrided and the edges defined to achieve cortical read. Reduction maneuver was performed using pointed reduction forceps and lobster forceps. In this manner, the fibula length was restored and fracture reduced. A lag screw was not placed given the orientation of fracture lines and comminution. Due to poor bone quality and extensive comminution at the fracture site, it was decided to use a locking distal fibula plate. We then selected a Zimmer locking plate to match the anatomy of the distal fibula and placed it laterally. This was implanted under intraoperative fluoroscopy with a combination of distal locking screws and proximal cortical & locking screws.  We then turned to the medial malleolar fracture. After obtaining reduction under fluoroscopy, a Kirschner wire was placed to secure this reduction and to serve as guide for a cannulated screw. We then made an incision around the wire and overdrilled this with a cannulated drill. We then placed a 4.0 mm Zimmer [employer] partially threaded lag screw. This screw was noted to achieve excellent compression across the fracture site and also have excellent purchase. We verified position of the screw and fracture reduction in all planes with fluoroscopy.  A manual external rotation stress radiograph was obtained and demonstrated widening of the ankle mortise. Given this intraoperative finding as well as preoperative subluxation, it was decided to reduce and fix the syndesmosis. Therefore a nonlocking quadricortical hex head 4.0 mm screw was implanted through the fibula plate in appropriate fashion to fix the syndesmosis. Screw position was verified along  anteromedial tibial cortex by fluoroscopy. A repeat stress radiograph showed complete stability of the ankle mortise to testing. We repeated this process with a second screw to reinforce stabilization.  The surgical sites were thoroughly irrigated. The tourniquet was deflated and hemostasis achieved. Betadine and vancomycin  powder were applied. The deep layers were closed using 2-0 vicryl. The skin was closed without tension.    The leg was cleaned with saline and sterile dressings with gauze were applied. A well padded bulky short leg splint was applied. The patient was awakened from anesthesia and transported to the recovery room in stable condition.    FOLLOW UP PLAN: -transfer to PACU, then return to RNF -strict NWB operative extremity, maximum elevation -maintain short leg splint until follow up -postop IV Ancef  ordered -PT/OT -pain meds script printed in chart (Oxycodone ) -DVT ppx: Aspirin  81 mg twice daily while NWB -follow up as outpatient within 7-10 days for wound check with exchange of short leg splint to short leg cast -sutures out in 2-3 weeks in outpatient office   RADIOGRAPHS: AP, lateral, oblique and stress radiographs of the left ankle were obtained intraoperatively. These showed interval reduction and fixation of the fractures. Manual stress radiographs were taken and the joints were noted to be stable following fixation. All hardware is appropriately positioned and of the appropriate lengths. No other acute injuries are noted.   Lillia Mountain [facility]

## 2024-03-22 NOTE — Plan of Care (Signed)
  Problem: Clinical Measurements: Goal: Will remain free from infection Outcome: Progressing Goal: Diagnostic test results will improve Outcome: Progressing   Problem: Activity: Goal: Risk for activity intolerance will decrease Outcome: Progressing   Problem: Elimination: Goal: Will not experience complications related to bowel motility Outcome: Progressing   Problem: Pain Managment: Goal: General experience of comfort will improve and/or be controlled Outcome: Progressing   Problem: Safety: Goal: Ability to remain free from injury will improve Outcome: Progressing   Problem: Skin Integrity: Goal: Risk for impaired skin integrity will decrease Outcome: Progressing

## 2024-03-22 NOTE — Progress Notes (Signed)
 Physical Therapy Treatment Patient Details Name: Rachel Black MRN: [id number] DOB: 12/15/1950 Today's Date: 03/22/2024   History of Present Illness Pt is a 73 y.o. F who presents after a fall with CT L ankle trimalleolar fx. PMH includes: arthritis, DM, gout, PFO, CVA, HTN, ERCP, TKA bil., CAD    PT Comments    Rachel Black is a 73 y.o. female POD 0 s/p L ankle ORIF. Patient is now limited by functional impairments (see PT problem list below). PT returned to assist staff with scoot transfer recliner to bed. PT entered room and pt seated in recliner, foot rest elevated and B LE to either side as if straddling with B LE in dependant position, pt ed provided on importance of elevating LLE, safety, fall risk prevention, use of call bell to communicate needs. Spouse had left the room. PT set up to the R side and bed in lowest position.  and requires mod A for bed mobility and total A  x 2 for transfers. Pt required extensive assist, strong multimodal cues and absent recall for LLE WB status. Patient will benefit from continued skilled PT interventions to address impairments and progress towards PLOF. Acute PT will follow to progress mobility in preparation for safe discharge to the next venue. Patient will benefit from continued inpatient follow up therapy, <3 hours/day. Pt left in bed, all needs in place.    If plan is discharge home, recommend the following: Two people to help with walking and/or transfers;Two people to help with bathing/dressing/bathroom;Assistance with cooking/housework;Assist for transportation;Help with stairs or ramp for entrance   Can travel by private vehicle     No  Equipment Recommendations  BSC/3in1;Wheelchair (measurements PT);Wheelchair cushion (measurements PT) (drop arm bedside commode, slide board)    Recommendations for Other Services       Precautions / Restrictions Precautions Precautions: Fall Recall of Precautions/Restrictions:  Intact Precaution/Restrictions Comments: difficulty with recall and observation of L LE NWB status Splint/Cast - Date Prophylactic Dressing Applied (if applicable): 03/22/24 Restrictions Weight Bearing Restrictions Per Provider Order: Yes LLE Weight Bearing Per Provider Order: Non weight bearing     Mobility  Bed Mobility Overal bed mobility: Needs Assistance Bed Mobility: Sit to Supine, Rolling Rolling: Min assist (L and R)   Supine to sit: HOB elevated, Used rails, Min assist Sit to supine: Used rails, Mod assist   General bed mobility comments: pt demonstrated limited abiltiy to manage L LE to return to bed with cues, bed flat use of bed rails for sit to supine and for rolling tasks, multimodal cues    Transfers Overall transfer level: Needs assistance Equipment used: None Transfers: Bed to chair/wheelchair/BSC Sit to Stand: Max assist, +2 physical assistance, +2 safety/equipment, From elevated surface          Lateral/Scoot Transfers: +2 physical assistance, +2 safety/equipment, From elevated surface, Total assist General transfer comment: PT set up for transfer to return to bed to the R side from recliner pt required total A to manage LLE to maintain LLE NWB status with extensive and multimodal cues and absent recall for WB status and surgery this am, pt required total A x 2 to safely return to EOB, hand over hand placement for scooting cues for pushing through R LE and trunk flexion with use of bed pad. PT strongly recomends use of hoyer lift for staff and pt safety with nursing    Ambulation/Gait               General  Gait Details: NT   Stairs             Wheelchair Mobility     Tilt Bed    Modified Rankin (Stroke Patients Only)       Balance Overall balance assessment: Needs assistance Sitting-balance support: Feet supported Sitting balance-Leahy Scale: Poor Sitting balance - Comments: posterior lean when unsupported seated EOB and minimal  initiation for recovery stratagies   Standing balance support: Bilateral upper extremity supported, Reliant on assistive device for balance Standing balance-Leahy Scale: Zero Standing balance comment: Needs BUE support and therapist assist to maintain NWBing on the LLE                            Communication Communication Communication: No apparent difficulties  Cognition Arousal: Alert Behavior During Therapy: Flat affect   PT - Cognitive impairments: Problem solving, Safety/Judgement, Memory, Awareness, Initiation, Sequencing                       PT - Cognition Comments: slow to respond to ?s and commands; unaware of NWB status and surgery on 7/9 Following commands: Intact      Cueing Cueing Techniques: Verbal cues  Exercises      General Comments        Pertinent Vitals/Pain Pain Assessment Pain Assessment: Faces Faces Pain Scale: Hurts a little bit Breathing: normal Negative Vocalization: none Facial Expression: smiling or inexpressive Body Language: relaxed Consolability: no need to console PAINAD Score: 0 Pain Location: L ankle Pain Descriptors / Indicators: Tender, Sore Pain Intervention(s): Limited activity within patient's tolerance, Premedicated before session, Monitored during session, Repositioned    Home Living                          Prior Function            PT Goals (current goals can now be found in the care plan section) Acute Rehab PT Goals Patient Stated Goal: to have surgery PT Goal Formulation: With patient/family Time For Goal Achievement: 03/30/24 Potential to Achieve Goals: Fair Progress towards PT goals: Progressing toward goals (slow progression due to poor recall for LLE precautions)    Frequency    Min 2X/week      PT Plan      Co-evaluation              AM-PAC PT 6 Clicks Mobility   Outcome Measure  Help needed turning from your back to your side while in a flat bed without  using bedrails?: A Lot Help needed moving from lying on your back to sitting on the side of a flat bed without using bedrails?: A Little Help needed moving to and from a bed to a chair (including a wheelchair)?: Total Help needed standing up from a chair using your arms (e.g., wheelchair or bedside chair)?: Total Help needed to walk in hospital room?: Total Help needed climbing 3-5 steps with a railing? : Total 6 Click Score: 9    End of Session Equipment Utilized During Treatment: Gait belt Activity Tolerance: Patient limited by fatigue Patient left: with call bell/phone within reach;in bed;with bed alarm set Nurse Communication: Mobility status;Weight bearing status;Need for lift equipment PT Visit Diagnosis: Unsteadiness on feet (R26.81);History of falling (Z91.81);Muscle weakness (generalized) (M62.81);Difficulty in walking, not elsewhere classified (R26.2)     Time: 8182-8168 PT Time Calculation (min) (ACUTE ONLY): 14 min  Charges:    $  Therapeutic Activity: 8-22 mins PT General Charges $$ ACUTE PT VISIT: 1 Visit                     [place], PT Acute Rehab    [place] VEAR Drone 03/22/2024, 7:13 PM

## 2024-03-22 NOTE — Transfer of Care (Signed)
 Immediate Anesthesia Transfer of Care Note  Patient: Rachel Black  Procedure(s) Performed: OPEN REDUCTION INTERNAL FIXATION (ORIF) ANKLE FRACTURE (Left: Ankle) REPAIR, SYNDESMOSIS, ANKLE (Left)  Patient Location: PACU  Anesthesia Type:General  Level of Consciousness: oriented, drowsy, and patient cooperative  Airway & Oxygen Therapy: Patient Spontanous Breathing and Patient connected to face mask oxygen  Post-op Assessment: Report given to RN and Post -op Vital signs reviewed and stable  Post vital signs: Reviewed and stable  Last Vitals:  Vitals Value Taken Time  BP 89/57 03/22/24 12:10  Temp    Pulse 70 03/22/24 12:14  Resp 13 03/22/24 12:15  SpO2 100 % 03/22/24 12:14  Vitals shown include unfiled device data.  Last Pain:  Vitals:   03/22/24 1011  TempSrc:   PainSc: 0-No pain      Patients Stated Pain Goal: 2 (03/21/24 1955)  Complications: No notable events documented.

## 2024-03-22 NOTE — Anesthesia Procedure Notes (Signed)
 Procedure Name: LMA Insertion Date/Time: 03/22/2024 10:41 AM  Performed by: Memory Armida LABOR, CRNAPre-anesthesia Checklist: Patient identified, Emergency Drugs available, Suction available, Patient being monitored and Timeout performed Patient Re-evaluated:Patient Re-evaluated prior to induction Oxygen Delivery Method: Circle system utilized Preoxygenation: Pre-oxygenation with 100% oxygen Induction Type: IV induction LMA: LMA with gastric port inserted LMA Size: 4.0 Placement Confirmation: positive ETCO2 Tube secured with: Tape Dental Injury: Teeth and Oropharynx as per pre-operative assessment

## 2024-03-23 ENCOUNTER — Other Ambulatory Visit (HOSPITAL_COMMUNITY): Payer: Self-pay

## 2024-03-23 DIAGNOSIS — S82852A Displaced trimalleolar fracture of left lower leg, initial encounter for closed fracture: Secondary | ICD-10-CM | POA: Diagnosis not present

## 2024-03-23 LAB — CBC
HCT: 25.8 % — ABNORMAL LOW (ref 36.0–46.0)
Hemoglobin: 8.6 g/dL — ABNORMAL LOW (ref 12.0–15.0)
MCH: 33.6 pg (ref 26.0–34.0)
MCHC: 33.3 g/dL (ref 30.0–36.0)
MCV: 100.8 fL — ABNORMAL HIGH (ref 80.0–100.0)
Platelets: 152 K/uL (ref 150–400)
RBC: 2.56 MIL/uL — ABNORMAL LOW (ref 3.87–5.11)
RDW: 12.9 % (ref 11.5–15.5)
WBC: 7.2 K/uL (ref 4.0–10.5)
nRBC: 0 % (ref 0.0–0.2)

## 2024-03-23 LAB — MAGNESIUM: Magnesium: 1.9 mg/dL (ref 1.7–2.4)

## 2024-03-23 MED ORDER — ACETAMINOPHEN 325 MG PO TABS
650.0000 mg | ORAL_TABLET | Freq: Four times a day (QID) | ORAL | Status: DC | PRN
  Administered 2024-03-24 – 2024-03-27 (×7): 650 mg via ORAL
  Filled 2024-03-23 (×7): qty 2

## 2024-03-23 NOTE — Progress Notes (Signed)
 Progress Note   Patient: Rachel Black FMW:[id number] DOB: 03/14/1951 DOA: 03/16/2024     4 DOS: the patient was seen and examined on 03/23/2024   Brief hospital course: Rachel Black is a 73 y.o. female with past medical history significant for HTN, HLD, CVA, DM 2, gout, chronic abdominal pain, IBS, GERD who presented to [facility] ED on 03/16/2024 with left ankle pain following a fall at home.  Patient was following her husband who was opening the door to the carport, feels like she slipped on a wet surface and unable to get up following the fall.  Complaining of left ankle pain. She was found to have a left trimalleolar fracture secondary to fall . S/p left trimalleolar ankle ORIF without internal fixation of posterior malleolus and left ankle syndesmosis s/p ORIF by Dr. Barton on 7/9.  Patient is doing well but she was tired today.  No fever no new complaints.  Assessment and Plan:  Left trimalleolar fracture secondary to fall Patient presents after having a fall at home that possibly occurred due to slippery surface.  X-rays revealed a left trimalleolar fracture.   -S/P  ORIF 7/9, by Dr. Barton -Percocet 5-325 mg p.o. q6h PRN  moderate pain -- Morphine  0.5 mg IV q2h PRN severe pain -- Robaxin  500 mg p.o. q6h PRN muscle spasms -- PT/OT evaluation - TOC to follow-up for SNF placement -- Further per orthopedics   Transient hypotension: Resolved Hx HTN Patient has had intermittent low blood pressure since coming in to the hospital.  Lowest  blood pressure noted to be 68/57, but patient appears to be asymptomatic.  Unclear if symptoms are related to this possible drop in hemoglobin or blood pressure cuff positioning. -- BP 105/51 this morning -- Holding home Imdur  and olmesartan  -- Continue IV fluids for another 24 hours -- Continue monitor BP closely   Macrocytic anemia Acute on chronic.  Hemoglobin noted to be 9.7, but had previously been 12.3 when checked on 7/2. Stool guaiacs were  noted to be negative.  Patient had been typed and screened in case of need of blood products. -- Anemia panel: Pending -- Hgb 9.7>9.0>9.2>9.8; stable -- Repeat H&H in a.m.   Chronic abdominal pain History of IBS Acute on chronic.  Patient reports a 2-year history of abdominal pain that acutely worsened on the second for which was her initial reason for evaluation in the ED.  CT scan of the abdomen pelvis with contrast at that time did not reveal any acute abnormality with diverticulosis and no signs of diverticulitis.  Patient makes note that she has been followed by [facility] for her abdominal pain previously. -- Outpatient follow-up with gastroenterology   Irritable bowel syndrome with constipation Patient reports last bowel movement 4 days ago.  She has been taking MiraLAX .  Received lactulose  on admission. -- Continue MiraLAX  daily -- Senokot-as 1 tablet twice daily -- Smog enema prn    Renal insufficiency Acute.  Creatinine noted to be elevated up to 1.38 with BUN 32.  Baseline creatinine previously had been around 1-1.1. - Recheck kidney function in a.m.   Mixed hyperlipidemia  -- Crestor  40 m p.o. daily -- Fenofibrate  160 mg p.o. daily   GERD -- Protonix  40 mg p.o. daily -- Pepcid  10 mg p.o. twice daily   Hx Gout -- Allopurinol  3 mg p.o. twice daily   Obesity, class I Body mass index is 31.81 kg/m.       DVT prophylaxis: SCDs Start: 03/19/24 1503  Code Status: Full Code          Subjective: Feels tired denies any fever  Physical Exam: Vitals:   03/22/24 2104 03/23/24 0554 03/23/24 0928 03/23/24 1342  BP: 122/67 123/76 (!) 143/77 (!) 117/93  Pulse: 88 76 65 (!) 103  Resp: 15 17 15 17   Temp: (!) 97.5 F (36.4 C) 97.7 F (36.5 C) 98.3 F (36.8 C) 98.2 F (36.8 C)  TempSrc:   Oral   SpO2: 99% 100% 100% 99%  Weight:      Height:       Constitutional: Alert, awake, calm, comfortable HEENT: Neck supple Respiratory: clear to auscultation  bilaterally, no wheezing, no crackles. Normal respiratory effort. No accessory muscle use.  Cardiovascular: Regular rate and rhythm, no murmurs / rubs / gallops. No extremity edema. 2+ pedal pulses. No carotid bruits.  Abdomen: no tenderness, no masses palpated. No hepatosplenomegaly. Bowel sounds positive.  Musculoskeletal: Left lower extremity dressing bandages Skin: no rashes, lesions, ulcers. No induration Neurologic: CN 2-12 grossly intact. Sensation intact, DTR normal. Strength 5/5 x all 4 extremities.  Psychiatric: Normal judgment and insight. Alert and oriented x 3. Normal mood.   Data Reviewed:  Creatinine: 1.39  Family Communication: No family was available  Disposition: Status is: Inpatient Remains inpatient appropriate because: Post op from ORIF  Planned Discharge Destination: Rehab    Time spent: 35 minutes  Author: Nena Rebel, MD 03/23/2024 4:38 PM  For on call review [email or web address].

## 2024-03-23 NOTE — Plan of Care (Signed)
  Problem: Clinical Measurements: Goal: Will remain free from infection Outcome: Progressing   Problem: Activity: Goal: Risk for activity intolerance will decrease Outcome: Progressing   Problem: Elimination: Goal: Will not experience complications related to bowel motility Outcome: Progressing   Problem: Safety: Goal: Ability to remain free from injury will improve Outcome: Progressing

## 2024-03-23 NOTE — Progress Notes (Signed)
 Physical Therapy Treatment Patient Details Name: Rachel Black MRN: [id number] DOB: 1951-03-25 Today's Date: 03/23/2024   History of Present Illness Pt is a 73 y.o. F who presents after a fall with CT L ankle trimalleolar fx. PMH includes: arthritis, DM, gout, PFO, CVA, HTN, ERCP, TKA bil., CAD    PT Comments  POD # 1 PT - Cognition Comments: improved cognition and level of alertness.  Following all commands.  Pleasant.  Funny. Spouse present.   Assisted to EOB.  General bed mobility comments: Pt required Mod Assist for upper body and Min Asisst for heavy/casted L LE to transfer from supine to EOB. General transfer comment: assisted from EOB to [facility] via squat pivot NO AD 1/4 turn to her RIGHT with VC's on proper hand placement/transfer and WBing through R LE only with asisst to guide hips to complete pivot.  Pt sat on BSC extended time for a BM. NT also in room assisting with a wash up.   Left Pt on BSC with call light in reach and Spouse in room.     If plan is discharge home, recommend the following: Two people to help with walking and/or transfers;Two people to help with bathing/dressing/bathroom;Assistance with cooking/housework;Assist for transportation;Help with stairs or ramp for entrance   Can travel by private vehicle     No  Equipment Recommendations  BSC/3in1;Wheelchair (measurements PT);Wheelchair cushion (measurements PT)    Recommendations for Other Services       Precautions / Restrictions Precautions Precautions: Fall Recall of Precautions/Restrictions: Intact Restrictions Weight Bearing Restrictions Per Provider Order: Yes LLE Weight Bearing Per Provider Order: Non weight bearing     Mobility  Bed Mobility Overal bed mobility: Needs Assistance       Supine to sit: HOB elevated, Used rails, Min assist     General bed mobility comments: Pt required Mod Assist for upper body and Min Asisst for heavy/casted L LE to transfer from supine to EOB.     Transfers Overall transfer level: Needs assistance Equipment used: None Transfers: Bed to chair/wheelchair/BSC       Squat pivot transfers: Mod assist, +2 physical assistance, +2 safety/equipment     General transfer comment: assisted from EOB to [facility] via squat pivot NO AD 1/4 turn to her RIGHT with VC's on proper hand placement/transfer and WBing through R LE only with asisst to guide hips to complete pivot.  Pt sat on BSC extended time for a BM.    Ambulation/Gait               General Gait Details: Transfers only this session   Stairs             Wheelchair Mobility     Tilt Bed    Modified Rankin (Stroke Patients Only)       Balance                                            Communication Communication Communication: No apparent difficulties  Cognition Arousal: Alert Behavior During Therapy: WFL for tasks assessed/performed   PT - Cognitive impairments: No apparent impairments                       PT - Cognition Comments: improved cognition and level of alertness.  Following all commands.  Pleasant.  Funny.        Cueing  Cueing Techniques: Verbal cues  Exercises      General Comments        Pertinent Vitals/Pain Pain Assessment Pain Assessment: Faces Faces Pain Scale: Hurts a little bit Pain Location: L ankle Pain Descriptors / Indicators: Tender, Sore Pain Intervention(s): Monitored during session, Repositioned    Home Living                          Prior Function            PT Goals (current goals can now be found in the care plan section) Progress towards PT goals: Progressing toward goals    Frequency    Min 2X/week      PT Plan      Co-evaluation              AM-PAC PT 6 Clicks Mobility   Outcome Measure  Help needed turning from your back to your side while in a flat bed without using bedrails?: A Lot Help needed moving from lying on your back to sitting on  the side of a flat bed without using bedrails?: A Lot Help needed moving to and from a bed to a chair (including a wheelchair)?: A Lot Help needed standing up from a chair using your arms (e.g., wheelchair or bedside chair)?: A Lot Help needed to walk in hospital room?: Total Help needed climbing 3-5 steps with a railing? : Total 6 Click Score: 10    End of Session Equipment Utilized During Treatment: Gait belt Activity Tolerance: Patient limited by fatigue Patient left: Other (comment);with family/visitor present;with call bell/phone within reach Nurse Communication: Mobility status PT Visit Diagnosis: Unsteadiness on feet (R26.81);History of falling (Z91.81);Muscle weakness (generalized) (M62.81);Difficulty in walking, not elsewhere classified (R26.2)     Time: 8879-8861 PT Time Calculation (min) (ACUTE ONLY): 18 min  Charges:    $Therapeutic Activity: 8-22 mins PT General Charges $$ ACUTE PT VISIT: 1 Visit   Katheryn Leap  PTA Acute  Rehabilitation Services Office M-F          [phone]

## 2024-03-23 NOTE — TOC Progression Note (Signed)
 Transition of Care [facility]) - Progression Note    Patient Details  Name: Rachel Black MRN: [id number] Date of Birth: 1950-10-15  Transition of Care [facility]) CM/SW Contact  Alfonse JONELLE Rex, RN Phone Number: 03/23/2024, 10:32 AM  Clinical Narrative:  PT eval completed, recommendation for short term rehab/SNF. Met with pt and spouse at bedside, spouse confirmed bed offer accepted at [facility]. [employer], rep-[employer], notified of accepted bed offer from 03/17/24, states she will check on bed availability. Patient has BCBS commercial primary, [employer] notified facility will need to initiate auth.       Expected Discharge Plan: Skilled Nursing Facility Barriers to Discharge: Continued Medical Work up, English as a second language teacher  Expected Discharge Plan and Services In-house Referral: Clinical Social Work   Post Acute Care Choice: Skilled Nursing Facility Living arrangements for the past 2 months: Single Family Home                                       Social Determinants of Health (SDOH) Interventions SDOH Screenings   Food Insecurity: No Food Insecurity (03/19/2024)  Housing: Low Risk  (03/19/2024)  Transportation Needs: No Transportation Needs (03/19/2024)  Utilities: Not At Risk (03/19/2024)  Depression (PHQ2-9): Low Risk  (05/12/2023)  Social Connections: Moderately Integrated (03/19/2024)  Tobacco Use: Low Risk  (03/22/2024)    Readmission Risk Interventions     No data to display

## 2024-03-23 NOTE — Progress Notes (Signed)
 Physical Therapy Treatment Patient Details Name: Rachel Black MRN: [id number] DOB: 1951/06/19 Today's Date: 03/23/2024   History of Present Illness Pt is a 73 y.o. F who presents after a fall with CT L ankle trimalleolar fx. PMH includes: arthritis, DM, gout, PFO, CVA, HTN, ERCP, TKA bil., CAD    PT Comments  POD # 1 Assisted off BSC.  Pt was able to void and have a small formed BM.  Assisted with posterior peri care as Pt was able to partially rise from [facility] then sit back down waiting for + 2 assist.  Then Assisted from [facility] to recliner 1/4 pivot NO AD with VC's on proper hand placement/transfer to complete 1/4 pivot on RIGHT LE towards her RIGHT drop arm recliner chair.  Required + 2 Max Assist. Positioned in recliner and elevated L LE.  Applied ICE.   LPT has rec Pt will need ST Rehab at SNF to address mobility and functional decline prior to safely returning home.    If plan is discharge home, recommend the following: Two people to help with walking and/or transfers;Two people to help with bathing/dressing/bathroom;Assistance with cooking/housework;Assist for transportation;Help with stairs or ramp for entrance   Can travel by private vehicle     No  Equipment Recommendations  BSC/3in1;Wheelchair (measurements PT);Wheelchair cushion (measurements PT)    Recommendations for Other Services       Precautions / Restrictions Precautions Precautions: Fall Recall of Precautions/Restrictions: Intact Restrictions Weight Bearing Restrictions Per Provider Order: Yes LLE Weight Bearing Per Provider Order: Non weight bearing     Mobility  Bed Mobility     General bed mobility comments: Pt OOB ob BSC    Transfers Overall transfer level: Needs assistance Equipment used: None Transfers: Bed to chair/wheelchair/BSC Sit to Stand: Max assist, +2 physical assistance, +2 safety/equipment, From elevated surface     Squat pivot transfers: Mod assist, +2 physical assistance, +2  safety/equipment     General transfer comment: Assisted from [facility] to recliner 1/4 pivot NO AD with VC's on proper hand placement/transfer to complete 1/4 pivot on RIGHT LE towards her RIGHT drop arm recliner chair.  Required + 2 Max Assist.    Ambulation/Gait               General Gait Details: Transfers only this session   Stairs             Wheelchair Mobility     Tilt Bed    Modified Rankin (Stroke Patients Only)       Balance                                            Communication Communication Communication: No apparent difficulties  Cognition Arousal: Alert Behavior During Therapy: WFL for tasks assessed/performed   PT - Cognitive impairments: No apparent impairments                       PT - Cognition Comments: improved cognition and level of alertness.  Following all commands.  Pleasant.  Funny.        Cueing Cueing Techniques: Verbal cues  Exercises      General Comments        Pertinent Vitals/Pain Pain Assessment Pain Assessment: Faces Faces Pain Scale: Hurts a little bit Pain Location: L ankle Pain Descriptors / Indicators: Tender, Sore Pain Intervention(s): Monitored during session,  Repositioned    Home Living                          Prior Function            PT Goals (current goals can now be found in the care plan section) Progress towards PT goals: Progressing toward goals    Frequency    Min 2X/week      PT Plan      Co-evaluation              AM-PAC PT 6 Clicks Mobility   Outcome Measure  Help needed turning from your back to your side while in a flat bed without using bedrails?: A Lot Help needed moving from lying on your back to sitting on the side of a flat bed without using bedrails?: A Lot Help needed moving to and from a bed to a chair (including a wheelchair)?: A Lot Help needed standing up from a chair using your arms (e.g., wheelchair or bedside  chair)?: A Lot Help needed to walk in hospital room?: Total Help needed climbing 3-5 steps with a railing? : Total 6 Click Score: 10    End of Session Equipment Utilized During Treatment: Gait belt Activity Tolerance: Patient limited by fatigue Patient left: in chair;with family/visitor present;with call bell/phone within reach Nurse Communication: Mobility status PT Visit Diagnosis: Unsteadiness on feet (R26.81);History of falling (Z91.81);Muscle weakness (generalized) (M62.81);Difficulty in walking, not elsewhere classified (R26.2)     Time: 8843-8786 PT Time Calculation (min) (ACUTE ONLY): 17 min  Charges:    $Therapeutic Activity: 8-22 mins PT General Charges $$ ACUTE PT VISIT: 1 Visit                     Katheryn Leap  PTA Acute  Rehabilitation Services Office M-F          [phone]

## 2024-03-23 NOTE — Plan of Care (Signed)
  Problem: Clinical Measurements: Goal: Will remain free from infection Outcome: Progressing Goal: Diagnostic test results will improve Outcome: Progressing   Problem: Activity: Goal: Risk for activity intolerance will decrease Outcome: Progressing   Problem: Elimination: Goal: Will not experience complications related to bowel motility Outcome: Completed/Met   Problem: Pain Managment: Goal: General experience of comfort will improve and/or be controlled Outcome: Adequate for Discharge   Problem: Safety: Goal: Ability to remain free from injury will improve Outcome: Progressing   Problem: Skin Integrity: Goal: Risk for impaired skin integrity will decrease Outcome: Progressing

## 2024-03-23 NOTE — Plan of Care (Signed)
  Problem: Clinical Measurements: Goal: Will remain free from infection Outcome: Progressing Goal: Diagnostic test results will improve Outcome: Progressing   Problem: Activity: Goal: Risk for activity intolerance will decrease Outcome: Progressing   Problem: Elimination: Goal: Will not experience complications related to bowel motility Outcome: Progressing   Problem: Pain Managment: Goal: General experience of comfort will improve and/or be controlled Outcome: Progressing   Problem: Safety: Goal: Ability to remain free from injury will improve Outcome: Progressing   Problem: Skin Integrity: Goal: Risk for impaired skin integrity will decrease Outcome: Progressing

## 2024-03-23 NOTE — Progress Notes (Signed)
 Physical Therapy Treatment Patient Details Name: Rachel Black MRN: [id number] DOB: 12-09-1950 Today's Date: 03/23/2024   History of Present Illness Pt is a 73 y.o. F who presents after a fall with CT L ankle trimalleolar fx. PMH includes: arthritis, DM, gout, PFO, CVA, HTN, ERCP, TKA bil., CAD    PT Comments  Pt did well OOB in recliner from noon till 5pm. Assisted back to bed and positioned to comfort.  Pt will need ST Rehab at SNF to address mobility and functional decline prior to safely returning home.    If plan is discharge home, recommend the following: Two people to help with walking and/or transfers;Two people to help with bathing/dressing/bathroom;Assistance with cooking/housework;Assist for transportation;Help with stairs or ramp for entrance   Can travel by private vehicle     No  Equipment Recommendations  BSC/3in1;Wheelchair (measurements PT);Wheelchair cushion (measurements PT)    Recommendations for Other Services       Precautions / Restrictions Precautions Precautions: Fall Recall of Precautions/Restrictions: Intact Restrictions Weight Bearing Restrictions Per Provider Order: Yes LLE Weight Bearing Per Provider Order: Non weight bearing     Mobility  Bed Mobility Overal bed mobility: Needs Assistance Bed Mobility: Sit to Supine      Sit to supine: Mod assist, +2 for safety/equipment   General bed mobility comments: Mod Assist + 2 back to bed and position to comfort    Transfers Overall transfer level: Needs assistance Equipment used: None Transfers: Bed to chair/wheelchair/BSC Sit to Stand: Max assist, +2 physical assistance, +2 safety/equipment, From elevated surface     Squat pivot transfers: Mod assist, +2 physical assistance, +2 safety/equipment     General transfer comment: assisted from recliner back to bed squat pivot + 2 to her RIGHT with NWBing LEFT NO AD using hands to steady/support self.    Ambulation/Gait                General Gait Details: Transfers only this session   Stairs             Wheelchair Mobility     Tilt Bed    Modified Rankin (Stroke Patients Only)       Balance                                            Communication Communication Communication: No apparent difficulties  Cognition Arousal: Alert Behavior During Therapy: WFL for tasks assessed/performed   PT - Cognitive impairments: No apparent impairments                       PT - Cognition Comments: improved cognition and level of alertness.  Following all commands.  Pleasant.  Funny.        Cueing Cueing Techniques: Verbal cues  Exercises      General Comments        Pertinent Vitals/Pain Pain Assessment Pain Assessment: Faces Faces Pain Scale: Hurts a little bit Pain Location: L ankle Pain Descriptors / Indicators: Tender, Sore Pain Intervention(s): Monitored during session, Repositioned    Home Living                          Prior Function            PT Goals (current goals can now be found in the care plan section) Progress towards  PT goals: Progressing toward goals    Frequency    Min 2X/week      PT Plan      Co-evaluation              AM-PAC PT 6 Clicks Mobility   Outcome Measure  Help needed turning from your back to your side while in a flat bed without using bedrails?: A Lot Help needed moving from lying on your back to sitting on the side of a flat bed without using bedrails?: A Lot Help needed moving to and from a bed to a chair (including a wheelchair)?: A Lot Help needed standing up from a chair using your arms (e.g., wheelchair or bedside chair)?: A Lot Help needed to walk in hospital room?: Total Help needed climbing 3-5 steps with a railing? : Total 6 Click Score: 10    End of Session Equipment Utilized During Treatment: Gait belt Activity Tolerance: Patient limited by fatigue Patient left: in bed;with call  bell/phone within reach;with family/visitor present Nurse Communication: Mobility status PT Visit Diagnosis: Unsteadiness on feet (R26.81);History of falling (Z91.81);Muscle weakness (generalized) (M62.81);Difficulty in walking, not elsewhere classified (R26.2)     Time: 8349-8281 PT Time Calculation (min) (ACUTE ONLY): 28 min  Charges:    $Therapeutic Activity: 23-37 mins PT General Charges $$ ACUTE PT VISIT: 1 Visit                     Katheryn Leap  PTA Acute  Rehabilitation Services Office M-F          [phone]

## 2024-03-23 NOTE — Progress Notes (Signed)
 Subjective:  Patient reports pain as appropriately controlled. Denies any new numbness/tingling. Ready for OR today.  Objective:   VITALS:  Temp:  [97.5 F (36.4 C)-98.5 F (36.9 C)] 98.3 F (36.8 C) (07/10 0928) Pulse Rate:  [65-88] 65 (07/10 0928) Resp:  [8-17] 15 (07/10 0928) BP: (77-143)/(38-77) 143/77 (07/10 0928) SpO2:  [90 %-100 %] 100 % (07/10 0928)  Gen: AAOx3, NAD  Left lower extremity: Short leg splint in place Wiggles toes SILT over toes CR<2s    LABS Recent Labs    03/22/24 0353 03/23/24 0438  HGB 8.7* 8.6*  WBC 5.7 7.2  PLT 156 152   Recent Labs    03/22/24 0353 03/22/24 1731  NA 134* 137  K 4.0 4.3  CL 105 105  CO2 23 22  BUN 25* 22  CREATININE 1.23* 1.39*  GLUCOSE 103* 178*   No results for input(s): LABPT, INR in the last 72 hours.   Assessment/Plan: Closed trimalleolar fracture of left ankle, initial encounter   -history, exam and imaging reviewed at length with patient/husband at bedside -plan today is for left ankle ORIF, possible syndesmosis and/or deltoid fixation, possible allograft -PT/OT postop -NPO and held VTE ppx   Lillia Mountain, MD Orthopaedic Surgeon [facility] [phone]   The risks and benefits were presented and reviewed. The risks due to hardware failure/irritation, new/persistent/recurrent infection, stiffness, nerve/vessel/tendon injury, nonunion/malunion of any fracture, wound healing issues, allograft usage, development of arthritis, failure of this surgery, possibility of external fixation in certain situations, possibility of delayed definitive surgery, need for further surgery, prolonged wound care including further soft tissue coverage procedures, thromboembolic events, anesthesia/medical complications/events perioperatively and beyond, amputation, death among others were discussed. The patient acknowledged the explanation, agreed to proceed with the plan.  Lillia Mountain 03/22/2024, 10:07 AM

## 2024-03-24 ENCOUNTER — Other Ambulatory Visit (HOSPITAL_COMMUNITY): Payer: Self-pay

## 2024-03-24 DIAGNOSIS — S82852A Displaced trimalleolar fracture of left lower leg, initial encounter for closed fracture: Secondary | ICD-10-CM | POA: Diagnosis not present

## 2024-03-24 LAB — CBC
HCT: 26.6 % — ABNORMAL LOW (ref 36.0–46.0)
Hemoglobin: 8.3 g/dL — ABNORMAL LOW (ref 12.0–15.0)
MCH: 32.5 pg (ref 26.0–34.0)
MCHC: 31.2 g/dL (ref 30.0–36.0)
MCV: 104.3 fL — ABNORMAL HIGH (ref 80.0–100.0)
Platelets: 154 K/uL (ref 150–400)
RBC: 2.55 MIL/uL — ABNORMAL LOW (ref 3.87–5.11)
RDW: 13.1 % (ref 11.5–15.5)
WBC: 6.8 K/uL (ref 4.0–10.5)
nRBC: 0.3 % — ABNORMAL HIGH (ref 0.0–0.2)

## 2024-03-24 LAB — COMPREHENSIVE METABOLIC PANEL WITH GFR
ALT: 8 U/L (ref 0–44)
AST: 15 U/L (ref 15–41)
Albumin: 2.7 g/dL — ABNORMAL LOW (ref 3.5–5.0)
Alkaline Phosphatase: 48 U/L (ref 38–126)
Anion gap: 9 (ref 5–15)
BUN: 32 mg/dL — ABNORMAL HIGH (ref 8–23)
CO2: 25 mmol/L (ref 22–32)
Calcium: 8.9 mg/dL (ref 8.9–10.3)
Chloride: 104 mmol/L (ref 98–111)
Creatinine, Ser: 1.02 mg/dL — ABNORMAL HIGH (ref 0.44–1.00)
GFR, Estimated: 59 mL/min — ABNORMAL LOW (ref >60)
Glucose, Bld: 143 mg/dL — ABNORMAL HIGH (ref 70–99)
Potassium: 3.8 mmol/L (ref 3.5–5.1)
Sodium: 138 mmol/L (ref 135–145)
Total Bilirubin: 0.3 mg/dL (ref 0.0–1.2)
Total Protein: 5.2 g/dL — ABNORMAL LOW (ref 6.5–8.1)

## 2024-03-24 LAB — BASIC METABOLIC PANEL WITH GFR
Anion gap: 9 (ref 5–15)
BUN: 32 mg/dL — ABNORMAL HIGH (ref 8–23)
CO2: 23 mmol/L (ref 22–32)
Calcium: 8.7 mg/dL — ABNORMAL LOW (ref 8.9–10.3)
Chloride: 107 mmol/L (ref 98–111)
Creatinine, Ser: 1.04 mg/dL — ABNORMAL HIGH (ref 0.44–1.00)
GFR, Estimated: 57 mL/min — ABNORMAL LOW (ref >60)
Glucose, Bld: 100 mg/dL — ABNORMAL HIGH (ref 70–99)
Potassium: 4.3 mmol/L (ref 3.5–5.1)
Sodium: 139 mmol/L (ref 135–145)

## 2024-03-24 LAB — MAGNESIUM: Magnesium: 1.7 mg/dL (ref 1.7–2.4)

## 2024-03-24 NOTE — Progress Notes (Signed)
 Occupational Therapy Treatment Patient Details Name: Rachel Black MRN: [id number] DOB: July 03, 1951 Today's Date: 03/24/2024   History of present illness Pt is a 73 y.o. F who presents after a fall with CT L ankle trimalleolar fx. PMH includes: arthritis, DM, gout, PFO, CVA, HTN, ERCP, TKA bil., CAD   OT comments  Patient seen for session with focus on transfer board training and improvement of overall strength and adapted strategies for BADL's and functional mobility. See below for current status with transfer board trial training. OT educated and trained in bed level therex for carryover between sessions. OT will continue to follow in acute hospital setting required to improve performance and safety and allow for discharge. Patient will benefit from continued inpatient follow up therapy, <3 hours/day.        If plan is discharge home, recommend the following:  A lot of help with walking and/or transfers;A lot of help with bathing/dressing/bathroom;Assistance with cooking/housework;Assist for transportation;Help with stairs or ramp for entrance   Equipment Recommendations  Other (comment) (TBA at rehab venue)       Precautions / Restrictions Precautions Precautions: Fall Recall of Precautions/Restrictions: Intact Precaution/Restrictions Comments: difficulty with recall and observation of L LE NWB status Splint/Cast - Date Prophylactic Dressing Applied (if applicable): 03/22/24 Restrictions Weight Bearing Restrictions Per Provider Order: Yes LLE Weight Bearing Per Provider Order: Non weight bearing       Mobility Bed Mobility Overal bed mobility: Needs Assistance Bed Mobility: Sit to Supine Rolling: Min assist, Used rails   Supine to sit: HOB elevated, Used rails, Min assist Sit to supine: Min assist, +2 for physical assistance, +2 for safety/equipment   General bed mobility comments: facilitation for bridging and scooting up the bed with assist    Transfers Overall  transfer level: Needs assistance Equipment used: Sliding board Transfers: Bed to chair/wheelchair/BSC Sit to Stand: Max assist, +2 physical assistance, +2 safety/equipment          Lateral/Scoot Transfers: Max assist, +2 physical assistance, +2 safety/equipment, With slide board General transfer comment: see above for cues for placement of board placement and NWB L LE     Balance Overall balance assessment: Needs assistance Sitting-balance support: Feet supported Sitting balance-Leahy Scale: Fair Sitting balance - Comments: improved upright trunk   Standing balance support: Bilateral upper extremity supported, Reliant on assistive device for balance Standing balance-Leahy Scale: Zero                             ADL either performed or assessed with clinical judgement   ADL Overall ADL's : Needs assistance/impaired Eating/Feeding: Set up;Sitting   Grooming: Wash/dry hands;Wash/dry face;Oral care;Set up   Upper Body Bathing: Contact guard assist   Lower Body Bathing: Moderate assistance;Sitting/lateral leans   Upper Body Dressing : Contact guard assist;Sitting   Lower Body Dressing: Maximal assistance;Sitting/lateral leans   Toilet Transfer: +2 for physical assistance;+2 for safety/equipment;Maximal assistance;Requires drop arm Toilet Transfer Details (indicate cue type and reason): needs DABSC Toileting- Clothing Manipulation and Hygiene: Maximal assistance;Sitting/lateral lean Toileting - Clothing Manipulation Details (indicate cue type and reason): bridging for peri hygiene for pre-[place] placement     Functional mobility during ADLs: Maximal assistance;+2 for physical assistance;+2 for safety/equipment (lateral transfer board transfer from recliner to bed with max A +2 and cues and demo for lateral lean for board placement) General ADL Comments: min A for lateral lean for board placement and cues for technique and cues for NWB L  LE    Extremity/Trunk  Assessment Upper Extremity Assessment Upper Extremity Assessment: Generalized weakness RUE Deficits / Details: Arthritic shoulders with crepitus. AAROM shoulder flexion to 80 degrees.AROM elbow flexion/extension and gross grasp and release WFLs with strength 3+/5. RUE: Shoulder pain with ROM RUE Coordination: decreased gross motor LUE Deficits / Details: Arthritic shoulders with crepitus. AAROM shoulder flexion to 80 degrees.  AROM elbow flexion/extension and gross grasp and release WFLs with strength 3+/5. LUE: Shoulder pain with ROM LUE Coordination: decreased gross motor   Lower Extremity Assessment Lower Extremity Assessment: Defer to PT evaluation                 Communication Communication Communication: No apparent difficulties   Cognition Arousal: Alert Behavior During Therapy: WFL for tasks assessed/performed Cognition: No apparent impairments             OT - Cognition Comments: mildly slower processing may be results of pain meds                 Following commands: Intact        Cueing   Cueing Techniques: Verbal cues  Exercises Exercises: Other exercises (OT demonstrated and trained in simple bed level core and upper leg and R lower leg therex to carryover between visits)       General Comments fatiques easily, BP 124/84; HR 104, SpO2 95%    Pertinent Vitals/ Pain       Pain Assessment Pain Assessment: Faces Faces Pain Scale: Hurts little more Pain Location: L ankle Pain Descriptors / Indicators: Aching, Sore, Throbbing Pain Intervention(s): Monitored during session, Premedicated before session, Repositioned, Relaxation, Ice applied   Frequency  Min 2X/week        Progress Toward Goals  OT Goals(current goals can now be found in the care plan section)  Progress towards OT goals: Progressing toward goals  Acute Rehab OT Goals Patient Stated Goal: to feel stronger OT Goal Formulation: With patient Time For Goal Achievement:  03/31/24 Potential to Achieve Goals: Fair ADL Goals Pt Will Perform Lower Body Bathing: with mod assist;sit to/from stand Pt Will Transfer to Toilet: with mod assist;squat pivot transfer;bedside commode Pt Will Perform Toileting - Clothing Manipulation and hygiene: with mod assist;sit to/from stand Additional ADL Goal #1: Pt will complete supine to sit EOB with min guard assist in preparation for selfcare tasks.  Plan         AM-PAC OT 6 Clicks Daily Activity     Outcome Measure   Help from another person eating meals?: None Help from another person taking care of personal grooming?: A Little Help from another person toileting, which includes using toliet, bedpan, or urinal?: A Lot Help from another person bathing (including washing, rinsing, drying)?: A Lot Help from another person to put on and taking off regular upper body clothing?: A Little Help from another person to put on and taking off regular lower body clothing?: A Lot 6 Click Score: 16    End of Session Equipment Utilized During Treatment: Gait belt  OT Visit Diagnosis: Unsteadiness on feet (R26.81);Other abnormalities of gait and mobility (R26.89);Muscle weakness (generalized) (M62.81);Pain Pain - Right/Left: Left Pain - part of body: Ankle and joints of foot   Activity Tolerance Patient limited by pain   Patient Left in chair;with chair alarm set;with family/visitor present   Nurse Communication Mobility status;Weight bearing status;Need for lift equipment (for next session)        Time: 8689-8667 OT Time Calculation (min): 22 min  Charges: OT General Charges $OT Visit: 1 Visit OT Treatments $Self Care/Home Management : 8-22 mins $Therapeutic Activity: 8-22 mins  Peyten Punches OT/L Acute Rehabilitation Department  [phone]  03/24/2024, 2:40 PM

## 2024-03-24 NOTE — Progress Notes (Signed)
 Occupational Therapy Treatment Patient Details Name: Rachel Black MRN: [id number] DOB: 04-24-1951 Today's Date: 03/24/2024   History of present illness Pt is a 73 y.o. F who presents after a fall with CT L ankle trimalleolar fx. PMH includes: arthritis, DM, gout, PFO, CVA, HTN, ERCP, TKA bil., CAD   OT comments  Patient seen for skilled OT session with focus on am self care and functional mobility from bed to recliner. Husband bedside and patient open to all therapy presented. OT education to better improve ability to recruit R LE, core/trunk and UE-strong side of body (has OA B shoulders and NWB L LE) to assist with bridging, forward weight shifts, lateral scoot transfers (+2 max A to r side), and core and UB use for bathing and dressing. Patient with some pain and overall reporting fatigue with BP response 118/68 and HR 104 once in recliner. Required + 2 for transfer this session. OT to see for later mobility with transfer board use. Will continue to follow in Acute hospital setting to continue to progress safety and performance. Patient will benefit from continued inpatient follow up therapy, <3 hours/day.        If plan is discharge home, recommend the following:  A lot of help with walking and/or transfers;A lot of help with bathing/dressing/bathroom;Assistance with cooking/housework;Assist for transportation;Help with stairs or ramp for entrance   Equipment Recommendations  Other (comment) (TBA at rehab venue)       Precautions / Restrictions Precautions Precautions: Fall Recall of Precautions/Restrictions: Intact Precaution/Restrictions Comments: difficulty with recall and observation of L LE NWB status Splint/Cast - Date Prophylactic Dressing Applied (if applicable): 03/22/24 Restrictions Weight Bearing Restrictions Per Provider Order: Yes LLE Weight Bearing Per Provider Order: Non weight bearing       Mobility Bed Mobility Overal bed mobility: Needs Assistance Bed  Mobility: Supine to Sit Rolling: Min assist, Used rails   Supine to sit: HOB elevated, Used rails, Min assist          Transfers Overall transfer level: Needs assistance Equipment used: None Transfers: Bed to chair/wheelchair/BSC Sit to Stand: Max assist, +2 physical assistance, +2 safety/equipment, From elevated surface           General transfer comment: cues and demo for scooting laterally on bed and into chair     Balance Overall balance assessment: Needs assistance Sitting-balance support: Feet supported Sitting balance-Leahy Scale: Fair Sitting balance - Comments: posterior lean when unsupported seated EOB and minimal initiation for recovery stratagies   Standing balance support: Bilateral upper extremity supported, Reliant on assistive device for balance Standing balance-Leahy Scale: Zero                             ADL either performed or assessed with clinical judgement   ADL Overall ADL's : Needs assistance/impaired Eating/Feeding: Set up;Sitting   Grooming: Wash/dry hands;Wash/dry face;Oral care;Set up   Upper Body Bathing: Contact guard assist   Lower Body Bathing: Moderate assistance;Sitting/lateral leans   Upper Body Dressing : Contact guard assist;Sitting   Lower Body Dressing: Maximal assistance;Sitting/lateral leans   Toilet Transfer: +2 for physical assistance;+2 for safety/equipment;Maximal assistance;Requires drop arm Toilet Transfer Details (indicate cue type and reason): needs DABSC Toileting- Clothing Manipulation and Hygiene: Maximal assistance;Sitting/lateral lean       Functional mobility during ADLs: Maximal assistance;+2 for physical assistance;+2 for safety/equipment (lateral squat pivot with poor recruitment of inact R side of body due to genralized weakness) General ADL  Comments: needs cues to put forth full body effort aside from L LE to assist and perform through trunk/UB and R LE    Extremity/Trunk Assessment Upper  Extremity Assessment Upper Extremity Assessment: Generalized weakness;RUE deficits/detail;LUE deficits/detail RUE Deficits / Details: Arthritic shoulders with crepitus. AAROM shoulder flexion to 80 degrees.AROM elbow flexion/extension and gross grasp and release WFLs with strength 3+/5. RUE: Shoulder pain with ROM RUE Coordination: decreased gross motor LUE Deficits / Details: Arthritic shoulders with crepitus. AAROM shoulder flexion to 80 degrees.  AROM elbow flexion/extension and gross grasp and release WFLs with strength 3+/5. LUE: Shoulder pain with ROM LUE Coordination: decreased gross motor   Lower Extremity Assessment Lower Extremity Assessment: Defer to PT evaluation                 Communication Communication Communication: No apparent difficulties   Cognition Arousal: Alert Behavior During Therapy: WFL for tasks assessed/performed Cognition: No apparent impairments             OT - Cognition Comments: mildly slower processing may be results of pain meds                 Following commands: Intact        Cueing   Cueing Techniques: Verbal cues        General Comments L LE Ace and splint in place and intact, educated on NWB L LE    Pertinent Vitals/ Pain       Pain Assessment Pain Assessment: Faces Faces Pain Scale: Hurts little more Pain Location: L ankle Pain Descriptors / Indicators: Aching, Sore Pain Intervention(s): Monitored during session, Premedicated before session, Repositioned, Relaxation, Ice applied   Frequency  Min 2X/week        Progress Toward Goals  OT Goals(current goals can now be found in the care plan section)  Progress towards OT goals: Progressing toward goals  Acute Rehab OT Goals Patient Stated Goal: to be able to stand OT Goal Formulation: With patient Time For Goal Achievement: 03/31/24 Potential to Achieve Goals: Fair ADL Goals Pt Will Perform Lower Body Bathing: with mod assist;sit to/from stand Pt Will  Transfer to Toilet: with mod assist;squat pivot transfer;bedside commode Pt Will Perform Toileting - Clothing Manipulation and hygiene: with mod assist;sit to/from stand Additional ADL Goal #1: Pt will complete supine to sit EOB with min guard assist in preparation for selfcare tasks.  Plan         AM-PAC OT 6 Clicks Daily Activity     Outcome Measure   Help from another person eating meals?: None Help from another person taking care of personal grooming?: A Little Help from another person toileting, which includes using toliet, bedpan, or urinal?: A Lot Help from another person bathing (including washing, rinsing, drying)?: A Lot Help from another person to put on and taking off regular upper body clothing?: A Little Help from another person to put on and taking off regular lower body clothing?: A Lot 6 Click Score: 16    End of Session Equipment Utilized During Treatment: Gait belt  OT Visit Diagnosis: Unsteadiness on feet (R26.81);Other abnormalities of gait and mobility (R26.89);Muscle weakness (generalized) (M62.81);Pain Pain - Right/Left: Left Pain - part of body: Ankle and joints of foot   Activity Tolerance Patient limited by pain   Patient Left in chair;with chair alarm set;with family/visitor present   Nurse Communication Mobility status;Patient requests pain meds;Weight bearing status;Other (comment) (for next session)        Time: 8954-8879 OT  Time Calculation (min): 35 min  Charges: OT General Charges $OT Visit: 1 Visit OT Treatments $Self Care/Home Management : 8-22 mins $Therapeutic Activity: 8-22 mins  Venancio Chenier OT/L Acute Rehabilitation Department  [phone]  03/24/2024, 1:42 PM

## 2024-03-24 NOTE — TOC Progression Note (Signed)
 Transition of Care [facility]) - Progression Note    Patient Details  Name: Rachel Black MRN: [id number] Date of Birth: 1950-12-27  [facility]) CM/SW Contact  Alfonse JONELLE Rex, RN Phone Number: 03/24/2024, 11:03 AM  Clinical Narrative:   Teams chat from attending, patient not medically ready for dc today. Text sent to [facility], admit coord, w/[facility], inquiring on bed availability and status of insurance auth, await response. TOC will continue to follow.   Expected Discharge Plan: Skilled Nursing Facility Barriers to Discharge: Continued Medical Work up, English as a second language teacher  Expected Discharge Plan and Services In-house Referral: Clinical Social Work   Post Acute Care Choice: Skilled Nursing Facility Living arrangements for the past 2 months: Single Family Home                                       Social Determinants of Health (SDOH) Interventions SDOH Screenings   Food Insecurity: No Food Insecurity (03/19/2024)  Housing: Low Risk  (03/19/2024)  Transportation Needs: No Transportation Needs (03/19/2024)  Utilities: Not At Risk (03/19/2024)  Depression (PHQ2-9): Low Risk  (05/12/2023)  Social Connections: Moderately Integrated (03/19/2024)  Tobacco Use: Low Risk  (03/22/2024)    Readmission Risk Interventions     No data to display

## 2024-03-24 NOTE — Anesthesia Postprocedure Evaluation (Signed)
 Anesthesia Post Note  Patient: Rachel Black  Procedure(s) Performed: OPEN REDUCTION INTERNAL FIXATION (ORIF) ANKLE FRACTURE (Left: Ankle) REPAIR, SYNDESMOSIS, ANKLE (Left)     Patient location during evaluation: PACU Anesthesia Type: General Level of consciousness: awake and alert Pain management: pain level controlled Vital Signs Assessment: post-procedure vital signs reviewed and stable Respiratory status: spontaneous breathing, nonlabored ventilation and respiratory function stable Cardiovascular status: blood pressure returned to baseline and stable Postop Assessment: no apparent nausea or vomiting Anesthetic complications: no   No notable events documented.  Last Vitals:  Vitals:   03/23/24 1955 03/24/24 0624  BP: (!) 98/59 126/64  Pulse: 87 70  Resp: 15 18  Temp: 36.9 C 37.1 C  SpO2: 97% 97%    Last Pain:  Vitals:   03/24/24 0624  TempSrc: Oral  PainSc:    Pain Goal: Patients Stated Pain Goal: 2 (03/22/24 2207)                 Butler Levander Pinal

## 2024-03-24 NOTE — Plan of Care (Signed)
  Problem: Pain Managment: Goal: General experience of comfort will improve and/or be controlled Outcome: Progressing   Problem: Safety: Goal: Ability to remain free from injury will improve Outcome: Progressing   Problem: Activity: Goal: Risk for activity intolerance will decrease Outcome: Progressing

## 2024-03-24 NOTE — Progress Notes (Signed)
 Progress Note   Patient: Rachel Black FMW:[id number] DOB: 02-07-1951 DOA: 03/16/2024     5 DOS: the patient was seen and examined on 03/24/2024   Brief hospital course: Rachel Black is a 73 y.o. female with past medical history significant for HTN, HLD, CVA, DM 2, gout, chronic abdominal pain, IBS, GERD who presented to [facility] ED on 03/16/2024 with left ankle pain following a fall at home.  Patient was following her husband who was opening the door to the carport, feels like she slipped on a wet surface and unable to get up following the fall.  Complaining of left ankle pain. She was found to have a left trimalleolar fracture secondary to fall . S/p left trimalleolar ankle ORIF without internal fixation of posterior malleolus and left ankle syndesmosis s/p ORIF by Dr. Barton on 7/9.  Patient is doing well but she was tired today.  No fever no new complaints.  Assessment and Plan:  Left trimalleolar fracture secondary to fall Patient presents after having a fall at home that possibly occurred due to slippery surface.  X-rays revealed a left trimalleolar fracture.   -S/P  ORIF 7/9, by Dr. Barton -Percocet 5-325 mg p.o. q6h PRN  moderate pain -- Morphine  0.5 mg IV q2h PRN severe pain -- Robaxin  500 mg p.o. q6h PRN muscle spasms -- PT/OT evaluation - TOC to follow-up for SNF placement, ready in 1-2 days -- Further plan per orthopedics   Transient hypotension: Resolved Hx HTN Patient has had intermittent low blood pressure since coming in to the hospital.  Lowest  blood pressure noted to be 68/57, but patient appears to be asymptomatic.  Unclear if symptoms are related to this possible drop in hemoglobin or blood pressure cuff positioning. -- BP 105/51 this morning -- Holding home Imdur  and olmesartan  -- Continue IV fluids for another 24 hours -- Continue monitor BP closely   Macrocytic anemia Acute on chronic.  Hemoglobin noted to be 9.7, but had previously been 12.3 when checked on  7/2. Stool guaiacs were noted to be negative.  Patient had been typed and screened in case of need of blood products. -- Anemia panel: Low folate and B12, replace folate, B12 and iron -- Hgb 9.7>9.0>9.2>9.8; stable -- Repeat H&H in a.m.   Chronic abdominal pain History of IBS Acute on chronic.  Patient reports a 2-year history of abdominal pain that acutely worsened on the second for which was her initial reason for evaluation in the ED.  CT scan of the abdomen pelvis with contrast at that time did not reveal any acute abnormality with diverticulosis and no signs of diverticulitis.  Patient makes note that she has been followed by [facility] for her abdominal pain previously. -- Outpatient follow-up with gastroenterology   Irritable bowel syndrome with constipation Patient reports last bowel movement 4 days ago.  She has been taking MiraLAX .  Received lactulose  on admission. -- Continue MiraLAX  daily -- Senokot-as 1 tablet twice daily -- Smog enema prn    Renal insufficiency Acute.  Creatinine noted to be elevated up to 1.38 with BUN 32.  Baseline creatinine previously had been around 1-1.1. - Recheck kidney function in a.m.   Mixed hyperlipidemia  -- Crestor  40 m p.o. daily -- Fenofibrate  160 mg p.o. daily   GERD -- Protonix  40 mg p.o. daily -- Pepcid  10 mg p.o. twice daily   Hx Gout -- Allopurinol  3 mg p.o. twice daily   Obesity, class I Body mass index is 31.81 kg/m.  DVT prophylaxis: SCDs Start: 03/19/24 1503     Code Status: Full Code      Subjective: He says he is tired otherwise no new symptoms no bleeding  Physical Exam: Vitals:   03/23/24 1342 03/23/24 1955 03/24/24 0624 03/24/24 1024  BP: (!) 117/93 (!) 98/59 126/64 (!) 133/106  Pulse: (!) 103 87 70 (!) 44  Resp: 17 15 18 15   Temp: 98.2 F (36.8 C) 98.4 F (36.9 C) 98.7 F (37.1 C) 98.7 F (37.1 C)  TempSrc:  Oral Oral Oral  SpO2: 99% 97% 97% 98%  Weight:      Height:       Constitutional:  Alert, awake, calm, comfortable HEENT: Neck supple Respiratory: clear to auscultation bilaterally, no wheezing, no crackles. Normal respiratory effort. No accessory muscle use.  Cardiovascular: Regular rate and rhythm, no murmurs / rubs / gallops. No extremity edema. 2+ pedal pulses. No carotid bruits.  Abdomen: no tenderness, no masses palpated. No hepatosplenomegaly. Bowel sounds positive.  Musculoskeletal: Left leg has splint Skin: no rashes, lesions, ulcers. No induration Neurologic: CN 2-12 grossly intact. Sensation intact, DTR normal. Strength 5/5 x all 4 extremities.  Psychiatric: Normal judgment and insight. Alert and oriented x 3. Normal mood.   Data Reviewed:  Low B12 55, folate 4.8  Family Communication: Family present at bedside  Disposition: Status is: Inpatient Remains inpatient appropriate because: Ongoing recovery from left trimalleolar fracture repair  Planned Discharge Destination: Skilled nursing facility    Time spent: 35 minutes  Author: Nena Rebel, MD 03/24/2024 1:56 PM  For on call review [email or web address].

## 2024-03-25 DIAGNOSIS — S82852A Displaced trimalleolar fracture of left lower leg, initial encounter for closed fracture: Secondary | ICD-10-CM | POA: Diagnosis not present

## 2024-03-25 LAB — CBC
HCT: 27.4 % — ABNORMAL LOW (ref 36.0–46.0)
Hemoglobin: 8.5 g/dL — ABNORMAL LOW (ref 12.0–15.0)
MCH: 32.6 pg (ref 26.0–34.0)
MCHC: 31 g/dL (ref 30.0–36.0)
MCV: 105 fL — ABNORMAL HIGH (ref 80.0–100.0)
Platelets: 160 K/uL (ref 150–400)
RBC: 2.61 MIL/uL — ABNORMAL LOW (ref 3.87–5.11)
RDW: 13.2 % (ref 11.5–15.5)
WBC: 6.6 K/uL (ref 4.0–10.5)
nRBC: 0 % (ref 0.0–0.2)

## 2024-03-25 LAB — MAGNESIUM: Magnesium: 1.7 mg/dL (ref 1.7–2.4)

## 2024-03-25 NOTE — Plan of Care (Signed)
  Problem: Activity: Goal: Risk for activity intolerance will decrease Outcome: Not Progressing   Problem: Pain Managment: Goal: General experience of comfort will improve and/or be controlled Outcome: Not Progressing   Problem: Skin Integrity: Goal: Risk for impaired skin integrity will decrease Outcome: Not Progressing

## 2024-03-25 NOTE — Progress Notes (Signed)
 PROGRESS NOTE  Rachel Black FMW:[id number] DOB: November 16, 1950 DOA: 03/16/2024 PCP: Wallace Joesph LABOR, PA   LOS: 6 days   Brief narrative:  Rachel Black is a 73 y.o. female with past medical history significant for hypertension, hyperlipidemia, CVA, DM 2, gout, chronic abdominal pain, IBS, GERD presented to the hospital  on 03/16/2024 with left ankle pain following a fall at home.  Patient was noted to have  left trimalleolar fracture secondary to fall and underwent ORIF without internal fixation of posterior malleolus and left ankle syndesmosis by Dr. Barton on 7/9.       Assessment/Plan: Principal Problem:   Left trimalleolar fracture Active Problems:   Fall at home, initial encounter   Transient hypotension   Macrocytic anemia   Generalized abdominal pain   Irritable bowel syndrome with constipation   Mixed hyperlipidemia   GERD without esophagitis    Assessment and Plan:   Left trimalleolar fracture secondary to fall -S/P  ORIF 7/9, by Dr. Barton will continue pain management.  PT has recommended skilled nursing facility.  Follow-up with orthopedics as outpatient in 7 to 10 days   Strict nonweightbearing and continue short leg splint.  Patient will be on aspirin  twice daily for DVT prophylaxis..   Transient hypotension: Resolved Hx HTN Blood pressure better this morning.  Still home Imdur  and olmesartan  at home.  Received IV fluids.   Macrocytic anemia Acute on chronic.  Hemoglobin noted to be at 8.5 from 9.7, but had previously been 12.3 on 03/15/2024.  Anemia panel with low folate and B12 will continue to replace folate, B12 and iron    Chronic abdominal pain History of IBS with constipation Acute on chronic.  Follows up with  [facility] as outpatient.  Continue MiraLAX  Senokot   Renal insufficiency Acute.  Creatinine noted to be elevated up to 1.38 with BUN 32.  Baseline creatinine previously had been around 1-1.1.  Creatinine today at 1.0.   Mixed  hyperlipidemia  Continue Crestor  and fenofibrate .  GERD Continue Protonix  and Pepcid    Hx Gout Continue allopurinol    Obesity, class I Body mass index is 31.81 kg/m.  Would benefit from ongoing weight loss as outpatient.  DVT prophylaxis: SCDs Start: 03/19/24 1503   Disposition: Skilled nursing facility.  Medically stable for disposition  Status is: Inpatient Remains inpatient appropriate because: Need for skilled nursing facility placement    Code Status:     Code Status: Full Code  Family Communication: Spoke with the patient's husband at bedside  Consultants: Orthopedics  Procedures: Open reduction and internal fixation of the left ankle fracture on 03/22/2024.  Anti-infectives:  None  Anti-infectives (From admission, onward)    Start     Dose/Rate Route Frequency Ordered Stop   03/22/24 1700  ceFAZolin  (ANCEF ) IVPB 1 g/50 mL premix        1 g 100 mL/hr over 30 Minutes Intravenous Every 8 hours 03/22/24 1203 03/23/24 1031   03/22/24 1149  vancomycin  (VANCOCIN ) powder  Status:  Discontinued          As needed 03/22/24 1150 03/22/24 1207   03/22/24 0600  ceFAZolin  (ANCEF ) IVPB 2g/100 mL premix        2 g 200 mL/hr over 30 Minutes Intravenous On call to O.R. 03/22/24 0043 03/22/24 1112   03/21/24 0600  ceFAZolin  (ANCEF ) IVPB 2g/100 mL premix        2 g 200 mL/hr over 30 Minutes Intravenous On call to O.R. 03/20/24 1448 03/22/24 0559  Subjective: Today, patient was seen and examined at bedside.  Patient denies interval complaints.  Mild discomfort at the surgical site.  Husband at bedside.  No shortness of breath dyspnea chest pain or palpitations  Objective: Vitals:   03/25/24 0921 03/25/24 1351  BP: 94/67 (!) 145/71  Pulse: 71 73  Resp: 16 16  Temp: 98.2 F (36.8 C) 98.6 F (37 C)  SpO2: 100% 94%    Intake/Output Summary (Last 24 hours) at 03/25/2024 1429 Last data filed at 03/25/2024 1407 Gross per 24 hour  Intake 600 ml  Output 1000 ml   Net -400 ml   Filed Weights   03/16/24 1005 03/19/24 1347 03/22/24 0903  Weight: 86 kg 89.4 kg 89.4 kg   Body mass index is 31.81 kg/m.   Physical Exam: GENERAL: Patient is alert awake and oriented. Not in obvious distress.  Obese built HENT: No scleral pallor or icterus. Pupils equally reactive to light. Oral mucosa is moist NECK: is supple, no gross swelling noted. CHEST: Clear to auscultation. No crackles or wheezes.  Diminished breath sounds bilaterally. CVS: S1 and S2 heard, no murmur. Regular rate and rhythm.  ABDOMEN: Soft, non-tender, bowel sounds are present. EXTREMITIES: Left ankle with leg splint in place. CNS: Cranial nerves are intact. No focal motor deficits. SKIN: warm and dry without rashes.  Data Review: I have personally reviewed the following laboratory data and studies,  CBC: Recent Labs  Lab 03/20/24 0256 03/22/24 0353 03/23/24 0438 03/24/24 0337 03/25/24 0344  WBC 4.9 5.7 7.2 6.8 6.6  HGB 9.8* 8.7* 8.6* 8.3* 8.5*  HCT 30.5* 27.6* 25.8* 26.6* 27.4*  MCV 100.3* 100.7* 100.8* 104.3* 105.0*  PLT 146* 156 152 154 160   Basic Metabolic Panel: Recent Labs  Lab 03/20/24 0256 03/22/24 0353 03/22/24 1731 03/23/24 0438 03/24/24 0337 03/24/24 1447 03/25/24 0344  NA 137 134* 137  --  139 138  --   K 4.3 4.0 4.3  --  4.3 3.8  --   CL 107 105 105  --  107 104  --   CO2 22 23 22   --  23 25  --   GLUCOSE 93 103* 178*  --  100* 143*  --   BUN 24* 25* 22  --  32* 32*  --   CREATININE 1.21* 1.23* 1.39*  --  1.04* 1.02*  --   CALCIUM  9.3 9.0 9.6  --  8.7* 8.9  --   MG  --   --   --  1.9 1.7  --  1.7   Liver Function Tests: Recent Labs  Lab 03/19/24 0857 03/22/24 1731 03/24/24 1447  AST 31 20 15   ALT 16 15 8   ALKPHOS 36* 45 48  BILITOT 0.5 0.4 0.3  PROT 5.3* 5.7* 5.2*  ALBUMIN  2.8* 3.1* 2.7*   No results for input(s): LIPASE, AMYLASE in the last 168 hours. No results for input(s): AMMONIA in the last 168 hours. Cardiac Enzymes: No  results for input(s): CKTOTAL, CKMB, CKMBINDEX, TROPONINI in the last 168 hours. BNP (last 3 results) No results for input(s): BNP in the last 8760 hours.  ProBNP (last 3 results) No results for input(s): PROBNP in the last 8760 hours.  CBG: Recent Labs  Lab 03/22/24 1239  GLUCAP 103*   Recent Results (from the past 240 hours)  Surgical pcr screen     Status: None   Collection Time: 03/21/24  8:54 PM   Specimen: Nasal Mucosa; Nasal Swab  Result Value Ref Range Status  MRSA, PCR NEGATIVE NEGATIVE Final   Staphylococcus aureus NEGATIVE NEGATIVE Final    Comment: (NOTE) The Xpert SA Assay (FDA approved for NASAL specimens in patients 36 years of age and older), is one component of a comprehensive surveillance program. It is not intended to diagnose infection nor to guide or monitor treatment. Performed at [facility], [address] [place], [place] [address]      Studies: No results found.    Madisan Bice, MD  [facility] 03/25/2024  If 7PM-7AM, please contact night-coverage

## 2024-03-25 NOTE — Plan of Care (Signed)
  Problem: Safety: Goal: Ability to remain free from injury will improve Outcome: Progressing   Problem: Pain Managment: Goal: General experience of comfort will improve and/or be controlled Outcome: Progressing   Problem: Activity: Goal: Risk for activity intolerance will decrease Outcome: Progressing

## 2024-03-25 NOTE — Progress Notes (Signed)
 Subjective: 3 Days Post-Op Procedure(s) (LRB): OPEN REDUCTION INTERNAL FIXATION (ORIF) ANKLE FRACTURE (Left) REPAIR, SYNDESMOSIS, ANKLE (Left)  Patient reports pain as appropriately controlled. Denies any new numbness/tingling. Accompanied by husband at bedside.  Objective:   VITALS:  Temp:  [97.6 F (36.4 C)-98.7 F (37.1 C)] 98 F (36.7 C) (07/12 0552) Pulse Rate:  [44-94] 71 (07/12 0552) Resp:  [15-16] 15 (07/12 0552) BP: (108-139)/(55-106) 108/62 (07/12 0552) SpO2:  [94 %-99 %] 96 % (07/12 0552)  Gen: AAOx3, NAD  Left lower extremity: Well padded short leg splint in place Wiggles toes SILT over toes CR<2s    LABS Recent Labs    03/23/24 0438 03/24/24 0337 03/25/24 0344  HGB 8.6* 8.3* 8.5*  WBC 7.2 6.8 6.6  PLT 152 154 160   Recent Labs    03/24/24 0337 03/24/24 1447  NA 139 138  K 4.3 3.8  CL 107 104  CO2 23 25  BUN 32* 32*  CREATININE 1.04* 1.02*  GLUCOSE 100* 143*   No results for input(s): LABPT, INR in the last 72 hours.   Assessment/Plan: 3 Days Post-Op Procedure(s) (LRB): OPEN REDUCTION INTERNAL FIXATION (ORIF) ANKLE FRACTURE (Left) REPAIR, SYNDESMOSIS, ANKLE (Left)  -stable on RNF -strict NWB operative extremity, maximum elevation -maintain short leg splint until follow up -PT/OT -pain meds script printed in chart (Oxycodone ) -DVT ppx: Aspirin  81 mg twice daily while NWB or rx per primary team choice -follow up as outpatient within 7-10 days for wound check with exchange of short leg splint to short leg cast -sutures out in 2-3 weeks in outpatient office  Lillia Mountain 03/25/2024, 8:03 AM

## 2024-03-26 DIAGNOSIS — S82852A Displaced trimalleolar fracture of left lower leg, initial encounter for closed fracture: Secondary | ICD-10-CM | POA: Diagnosis not present

## 2024-03-26 NOTE — Progress Notes (Signed)
 Patient ID: Rachel Black, female   DOB: [birth year], 73 y.o.   MRN: [id number] Subjective: 4 Days Post-Op Procedure(s) (LRB): OPEN REDUCTION INTERNAL FIXATION (ORIF) ANKLE FRACTURE (Left) REPAIR, SYNDESMOSIS, ANKLE (Left)    Patient reports pain as mild. Reports that she is just sleepy Functional limitations sue to weight bearing restrictions from ankle surgery  Objective:   VITALS:   Vitals:   03/25/24 1351 03/25/24 1954  BP: (!) 145/71 116/67  Pulse: 73 77  Resp: 16 18  Temp: 98.6 F (37 C) 98.6 F (37 C)  SpO2: 94% 94%    Neurovascular intact Bulky LLE splint intact Moves toes and has intact sensibility distally  LABS Recent Labs    03/24/24 0337 03/25/24 0344  HGB 8.3* 8.5*  HCT 26.6* 27.4*  WBC 6.8 6.6  PLT 154 160    Recent Labs    03/24/24 0337 03/24/24 1447  NA 139 138  K 4.3 3.8  BUN 32* 32*  CREATININE 1.04* 1.02*  GLUCOSE 100* 143*    No results for input(s): LABPT, INR in the last 72 hours.   Assessment/Plan: 4 Days Post-Op Procedure(s) (LRB): OPEN REDUCTION INTERNAL FIXATION (ORIF) ANKLE FRACTURE (Left) REPAIR, SYNDESMOSIS, ANKLE (Left)   Up with therapy At this point she will likely need ST SNF placement due to mobility restrictions and help at home Follow up plan as noted in Ramanathan's note NWB LLE

## 2024-03-26 NOTE — Progress Notes (Signed)
 PROGRESS NOTE  Rachel Black FMW:[id number] DOB: [birth year] DOA: 03/16/2024 PCP: Wallace Joesph LABOR, PA   LOS: 7 days   Brief narrative:  Rachel Black is a 73 y.o. female with past medical history significant for hypertension, hyperlipidemia, CVA, DM 2, gout, chronic abdominal pain, IBS, GERD presented to the hospital  on 03/16/2024 with left ankle pain following a fall at home.  Patient was noted to have  left trimalleolar fracture secondary to fall and underwent ORIF without internal fixation of posterior malleolus and left ankle syndesmosis by Dr. Barton on 7/9.  Currently awaiting for skilled nursing facility placement.     Assessment/Plan: Principal Problem:   Left trimalleolar fracture Active Problems:   Fall at home, initial encounter   Transient hypotension   Macrocytic anemia   Generalized abdominal pain   Irritable bowel syndrome with constipation   Mixed hyperlipidemia   GERD without esophagitis    Assessment and Plan:   Left trimalleolar fracture secondary to fall Status post ORIF 7/9, by Dr. Barton will continue pain management.  PT has recommended skilled nursing facility.  Follow-up with orthopedics as outpatient in 7 to 10 days   Strict nonweightbearing and continue short leg splint.  Patient will be on aspirin  twice daily for DVT prophylaxis.  Currently awaiting for short-term rehabilitation at a skilled nursing facility   Transient hypotension: Resolved Hx HTN Blood pressure better this morning.  Patient takes Imdur  and olmesartan  at home.  Received IV fluids.  Blood pressure medication still on hold   Macrocytic anemia Acute on chronic.  Hemoglobin noted to be at 8.5 from 9.7, but had previously been 12.3 on 03/15/2024.  Anemia panel with low folate and B12 will continue to replace folate, B12 and iron.  Check CBC in AM.   Chronic abdominal pain History of IBS with constipation Acute on chronic.  Follows up with  [facility] as outpatient.  Continue  MiraLAX , Senokot   Renal insufficiency Acute.  Creatinine noted to be elevated up to 1.38 with BUN 32.  Baseline creatinine previously had been around 1-1.1.  Latest creatinine at 1.0.  Check BMP in AM.   Mixed hyperlipidemia  Continue Crestor  and fenofibrate .  GERD Continue Protonix  and Pepcid    Hx Gout Continue allopurinol    Obesity, class I Body mass index is 31.81 kg/m.  Would benefit from ongoing weight loss as outpatient.  DVT prophylaxis: SCDs Start: 03/19/24 1503   Disposition: Skilled nursing facility.  Medically stable for disposition  Status is: Inpatient Remains inpatient appropriate because: Need for skilled nursing facility placement    Code Status:     Code Status: Full Code  Family Communication: Spoke with the patient's husband at bedside on 03/25/2024  Consultants: Orthopedics  Procedures: Open reduction and internal fixation of the left ankle fracture on 03/22/2024.  Anti-infectives:  None  Anti-infectives (From admission, onward)    Start     Dose/Rate Route Frequency Ordered Stop   03/22/24 1700  ceFAZolin  (ANCEF ) IVPB 1 g/50 mL premix        1 g 100 mL/hr over 30 Minutes Intravenous Every 8 hours 03/22/24 1203 03/23/24 1031   03/22/24 1149  vancomycin  (VANCOCIN ) powder  Status:  Discontinued          As needed 03/22/24 1150 03/22/24 1207   03/22/24 0600  ceFAZolin  (ANCEF ) IVPB 2g/100 mL premix        2 g 200 mL/hr over 30 Minutes Intravenous On call to O.R. 03/22/24 9956 03/22/24 1112  03/21/24 0600  ceFAZolin  (ANCEF ) IVPB 2g/100 mL premix        2 g 200 mL/hr over 30 Minutes Intravenous On call to O.R. 03/20/24 1448 03/22/24 0559        Subjective: Today, patient was seen and examined at bedside.  Patient complains of mild pain around the surgical site, denies nausea vomiting fever chills or rigor.  Denies any shortness of breath cough or fever.  Objective: Vitals:   03/25/24 1351 03/25/24 1954  BP: (!) 145/71 116/67  Pulse: 73  77  Resp: 16 18  Temp: 98.6 F (37 C) 98.6 F (37 C)  SpO2: 94% 94%    Intake/Output Summary (Last 24 hours) at 03/26/2024 1017 Last data filed at 03/26/2024 0701 Gross per 24 hour  Intake 360 ml  Output 1100 ml  Net -740 ml   Filed Weights   03/16/24 1005 03/19/24 1347 03/22/24 0903  Weight: 86 kg 89.4 kg 89.4 kg   Body mass index is 31.81 kg/m.   Physical Exam: GENERAL: Patient is alert awake and oriented. Not in obvious distress.  Obese built HENT: No scleral pallor or icterus. Pupils equally reactive to light. Oral mucosa is moist NECK: is supple, no gross swelling noted. CHEST: Clear to auscultation. No crackles or wheezes.  Diminished breath sounds bilaterally. CVS: S1 and S2 heard, no murmur. Regular rate and rhythm.  ABDOMEN: Soft, non-tender, bowel sounds are present. EXTREMITIES: Left ankle with leg splint in place. CNS: Cranial nerves are intact. No focal motor deficits. SKIN: warm and dry without rashes.  Data Review: I have personally reviewed the following laboratory data and studies,  CBC: Recent Labs  Lab 03/20/24 0256 03/22/24 0353 03/23/24 0438 03/24/24 0337 03/25/24 0344  WBC 4.9 5.7 7.2 6.8 6.6  HGB 9.8* 8.7* 8.6* 8.3* 8.5*  HCT 30.5* 27.6* 25.8* 26.6* 27.4*  MCV 100.3* 100.7* 100.8* 104.3* 105.0*  PLT 146* 156 152 154 160   Basic Metabolic Panel: Recent Labs  Lab 03/20/24 0256 03/22/24 0353 03/22/24 1731 03/23/24 0438 03/24/24 0337 03/24/24 1447 03/25/24 0344  NA 137 134* 137  --  139 138  --   K 4.3 4.0 4.3  --  4.3 3.8  --   CL 107 105 105  --  107 104  --   CO2 22 23 22   --  23 25  --   GLUCOSE 93 103* 178*  --  100* 143*  --   BUN 24* 25* 22  --  32* 32*  --   CREATININE 1.21* 1.23* 1.39*  --  1.04* 1.02*  --   CALCIUM  9.3 9.0 9.6  --  8.7* 8.9  --   MG  --   --   --  1.9 1.7  --  1.7   Liver Function Tests: Recent Labs  Lab 03/22/24 1731 03/24/24 1447  AST 20 15  ALT 15 8  ALKPHOS 45 48  BILITOT 0.4 0.3  PROT 5.7*  5.2*  ALBUMIN  3.1* 2.7*   No results for input(s): LIPASE, AMYLASE in the last 168 hours. No results for input(s): AMMONIA in the last 168 hours. Cardiac Enzymes: No results for input(s): CKTOTAL, CKMB, CKMBINDEX, TROPONINI in the last 168 hours. BNP (last 3 results) No results for input(s): BNP in the last 8760 hours.  ProBNP (last 3 results) No results for input(s): PROBNP in the last 8760 hours.  CBG: Recent Labs  Lab 03/22/24 1239  GLUCAP 103*   Recent Results (from the past 240 hours)  Surgical pcr screen     Status: None   Collection Time: 03/21/24  8:54 PM   Specimen: Nasal Mucosa; Nasal Swab  Result Value Ref Range Status   MRSA, PCR NEGATIVE NEGATIVE Final   Staphylococcus aureus NEGATIVE NEGATIVE Final    Comment: (NOTE) The Xpert SA Assay (FDA approved for NASAL specimens in patients 53 years of age and older), is one component of a comprehensive surveillance program. It is not intended to diagnose infection nor to guide or monitor treatment. Performed at [facility], [address], [place], [place] [address]      Studies: No results found.    Aiman Sonn, MD  [facility] 03/26/2024  If 7PM-7AM, please contact night-coverage

## 2024-03-26 NOTE — Plan of Care (Signed)
  Problem: Clinical Measurements: Goal: Diagnostic test results will improve Outcome: Not Progressing   Problem: Activity: Goal: Risk for activity intolerance will decrease Outcome: Not Progressing   Problem: Pain Managment: Goal: General experience of comfort will improve and/or be controlled Outcome: Not Progressing   Problem: Skin Integrity: Goal: Risk for impaired skin integrity will decrease Outcome: Not Progressing

## 2024-03-27 DIAGNOSIS — S82852A Displaced trimalleolar fracture of left lower leg, initial encounter for closed fracture: Secondary | ICD-10-CM | POA: Diagnosis not present

## 2024-03-27 LAB — CBC
HCT: 29.1 % — ABNORMAL LOW (ref 36.0–46.0)
Hemoglobin: 9.4 g/dL — ABNORMAL LOW (ref 12.0–15.0)
MCH: 33.3 pg (ref 26.0–34.0)
MCHC: 32.3 g/dL (ref 30.0–36.0)
MCV: 103.2 fL — ABNORMAL HIGH (ref 80.0–100.0)
Platelets: 165 K/uL (ref 150–400)
RBC: 2.82 MIL/uL — ABNORMAL LOW (ref 3.87–5.11)
RDW: 13 % (ref 11.5–15.5)
WBC: 6.6 K/uL (ref 4.0–10.5)
nRBC: 0 % (ref 0.0–0.2)

## 2024-03-27 LAB — BASIC METABOLIC PANEL WITH GFR
Anion gap: 10 (ref 5–15)
BUN: 26 mg/dL — ABNORMAL HIGH (ref 8–23)
CO2: 22 mmol/L (ref 22–32)
Calcium: 9 mg/dL (ref 8.9–10.3)
Chloride: 105 mmol/L (ref 98–111)
Creatinine, Ser: 1.09 mg/dL — ABNORMAL HIGH (ref 0.44–1.00)
GFR, Estimated: 54 mL/min — ABNORMAL LOW (ref >60)
Glucose, Bld: 97 mg/dL (ref 70–99)
Potassium: 3.9 mmol/L (ref 3.5–5.1)
Sodium: 137 mmol/L (ref 135–145)

## 2024-03-27 NOTE — Plan of Care (Signed)
  Problem: Pain Managment: Goal: General experience of comfort will improve and/or be controlled Outcome: Progressing   Problem: Safety: Goal: Ability to remain free from injury will improve Outcome: Progressing   Problem: Skin Integrity: Goal: Risk for impaired skin integrity will decrease Outcome: Progressing

## 2024-03-27 NOTE — Progress Notes (Signed)
 PROGRESS NOTE  [name] FMW:[id number] DOB: 05/11/1951 DOA: 03/16/2024 PCP: Wallace Joesph LABOR, PA   LOS: 8 days   Brief narrative:  Rachel Black is a 73 y.o. female with past medical history significant for hypertension, hyperlipidemia, CVA, DM 2, gout, chronic abdominal pain, IBS, GERD presented to the hospital  on 03/16/2024 with left ankle pain following a fall at home.  Patient was noted to have  left trimalleolar fracture secondary to fall and underwent ORIF without internal fixation of posterior malleolus and left ankle syndesmosis by Dr. Barton on 7/9.  Currently awaiting for skilled nursing facility placement.     Assessment/Plan: Principal Problem:   Left trimalleolar fracture Active Problems:   Fall at home, initial encounter   Transient hypotension   Macrocytic anemia   Generalized abdominal pain   Irritable bowel syndrome with constipation   Mixed hyperlipidemia   GERD without esophagitis    Assessment and Plan:   Left trimalleolar fracture secondary to fall Status post ORIF 7/9, by Dr. Barton will continue pain management.  PT has recommended skilled nursing facility.  Follow-up with orthopedics as outpatient in 7 to 10 days   Strict nonweightbearing and continue short leg splint.  Patient will be on aspirin  twice daily for DVT prophylaxis.  Currently awaiting for short-term rehabilitation at a skilled nursing facility   Transient hypotension: Resolved Hx HTN Blood pressure better this morning.  Patient takes Imdur  and olmesartan  at home.  Received IV fluids.  Blood pressure medication still on hold   Macrocytic anemia Acute on chronic.  Hemoglobin noted to be at 8.5 from 9.7, but had previously been 12.3 on 03/15/2024.  Anemia panel with low folate and B12 will continue to replace folate, B12 and iron.  Latest hemoglobin of 9.4.   Chronic abdominal pain History of IBS with constipation Acute on chronic.  Follows up with  [facility] as outpatient.   Continue MiraLAX , Senokot   Renal insufficiency Acute.  Creatinine noted to be elevated up to 1.38 with BUN 32.  Baseline creatinine previously had been around 1-1.1.  Latest creatinine at 1.0.     Mixed hyperlipidemia  Continue Crestor  and fenofibrate .  GERD Continue Protonix  and Pepcid    Hx Gout Continue allopurinol    Obesity, class I Body mass index is 31.81 kg/m.  Would benefit from ongoing weight loss as outpatient.  DVT prophylaxis: SCDs Start: 03/19/24 1503   Disposition: Skilled nursing facility.  Medically stable for disposition  Status is: Inpatient Remains inpatient appropriate because: Need for skilled nursing facility placement    Code Status:     Code Status: Full Code  Family Communication: Spoke with the patient's husband at bedside on 03/25/2024  Consultants: Orthopedics  Procedures: Open reduction and internal fixation of the left ankle fracture on 03/22/2024.  Anti-infectives:  None  Anti-infectives (From admission, onward)    Start     Dose/Rate Route Frequency Ordered Stop   03/22/24 1700  ceFAZolin  (ANCEF ) IVPB 1 g/50 mL premix        1 g 100 mL/hr over 30 Minutes Intravenous Every 8 hours 03/22/24 1203 03/23/24 1031   03/22/24 1149  vancomycin  (VANCOCIN ) powder  Status:  Discontinued          As needed 03/22/24 1150 03/22/24 1207   03/22/24 0600  ceFAZolin  (ANCEF ) IVPB 2g/100 mL premix        2 g 200 mL/hr over 30 Minutes Intravenous On call to O.R. 03/22/24 0043 03/22/24 1112   03/21/24 0600  ceFAZolin  (ANCEF ) IVPB 2g/100 mL premix        2 g 200 mL/hr over 30 Minutes Intravenous On call to O.R. 03/20/24 1448 03/22/24 0559        Subjective: Today, patient was seen and examined at bedside.  Patient denies interval complaints.  Mild pain around the surgical site.  Denies any nausea vomiting fever chills or rigor.  Objective: Vitals:   03/26/24 2219 03/27/24 0535  BP: 101/75 106/65  Pulse: 81 68  Resp: 17 18  Temp: 98.8 F (37.1  C) 97.7 F (36.5 C)  SpO2: 99% 100%    Intake/Output Summary (Last 24 hours) at 03/27/2024 0826 Last data filed at 03/27/2024 0600 Gross per 24 hour  Intake 300 ml  Output 1000 ml  Net -700 ml   Filed Weights   03/16/24 1005 03/19/24 1347 03/22/24 0903  Weight: 86 kg 89.4 kg 89.4 kg   Body mass index is 31.81 kg/m.   Physical Exam: General: Obese built, not in obvious distress HENT:   No scleral pallor or icterus noted. Oral mucosa is moist.  Chest:  Clear breath sounds.   No crackles or wheezes.  CVS: S1 &S2 heard. No murmur.  Regular rate and rhythm. Abdomen: Soft, nontender, nondistended.  Bowel sounds are heard.   Extremities: No cyanosis, clubbing, left ankle with splint.  Psych: Alert, awake and oriented, normal mood CNS:  No cranial nerve deficits.  Power equal in all extremities.   Skin: Warm and dry.  No rashes noted.   Data Review: I have personally reviewed the following laboratory data and studies,  CBC: Recent Labs  Lab 03/22/24 0353 03/23/24 0438 03/24/24 0337 03/25/24 0344 03/27/24 0339  WBC 5.7 7.2 6.8 6.6 6.6  HGB 8.7* 8.6* 8.3* 8.5* 9.4*  HCT 27.6* 25.8* 26.6* 27.4* 29.1*  MCV 100.7* 100.8* 104.3* 105.0* 103.2*  PLT 156 152 154 160 165   Basic Metabolic Panel: Recent Labs  Lab 03/22/24 0353 03/22/24 1731 03/23/24 0438 03/24/24 0337 03/24/24 1447 03/25/24 0344 03/27/24 0339  NA 134* 137  --  139 138  --  137  K 4.0 4.3  --  4.3 3.8  --  3.9  CL 105 105  --  107 104  --  105  CO2 23 22  --  23 25  --  22  GLUCOSE 103* 178*  --  100* 143*  --  97  BUN 25* 22  --  32* 32*  --  26*  CREATININE 1.23* 1.39*  --  1.04* 1.02*  --  1.09*  CALCIUM  9.0 9.6  --  8.7* 8.9  --  9.0  MG  --   --  1.9 1.7  --  1.7  --    Liver Function Tests: Recent Labs  Lab 03/22/24 1731 03/24/24 1447  AST 20 15  ALT 15 8  ALKPHOS 45 48  BILITOT 0.4 0.3  PROT 5.7* 5.2*  ALBUMIN  3.1* 2.7*   No results for input(s): LIPASE, AMYLASE in the last 168  hours. No results for input(s): AMMONIA in the last 168 hours. Cardiac Enzymes: No results for input(s): CKTOTAL, CKMB, CKMBINDEX, TROPONINI in the last 168 hours. BNP (last 3 results) No results for input(s): BNP in the last 8760 hours.  ProBNP (last 3 results) No results for input(s): PROBNP in the last 8760 hours.  CBG: Recent Labs  Lab 03/22/24 1239  GLUCAP 103*   Recent Results (from the past 240 hours)  Surgical pcr screen  Status: None   Collection Time: 03/21/24  8:54 PM   Specimen: Nasal Mucosa; Nasal Swab  Result Value Ref Range Status   MRSA, PCR NEGATIVE NEGATIVE Final   Staphylococcus aureus NEGATIVE NEGATIVE Final    Comment: (NOTE) The Xpert SA Assay (FDA approved for NASAL specimens in patients 78 years of age and older), is one component of a comprehensive surveillance program. It is not intended to diagnose infection nor to guide or monitor treatment. Performed at [facility], [address] [place], [place] [address]      Studies: No results found.    Donnice Nielsen, MD  [facility] 03/27/2024  If 7PM-7AM, please contact night-coverage

## 2024-03-27 NOTE — TOC Progression Note (Deleted)
 Transition of Care [facility]) - Progression Note    Patient Details  Name: Rachel Black MRN: [id number] Date of Birth: 06-08-1951  Transition of Care [facility]) CM/SW Contact  Alfonse JONELLE Rex, RN Phone Number: 03/27/2024, 11:43 AM  Clinical Narrative:   Text received from Kia with [employer], states insurance is OON with facility. Met with pt at bedside to review other SNF bed offers ( [address] [place], [place]), [facility] called to patient's spouse mobile, no answer, no vm. Patient provided spouse's work phone, no answer, vm left with NCM name and phone number for spouse to return call. Patient states spouse plans to visit her today after work. NCM left list of SNF bed offers at bedside with note request to call NCM, await call from spouse.     Expected Discharge Plan: Skilled Nursing Facility Barriers to Discharge: Continued Medical Work up, English as a second language teacher  Expected Discharge Plan and Services In-house Referral: Clinical Social Work   Post Acute Care Choice: Skilled Nursing Facility Living arrangements for the past 2 months: Single Family Home                                       Social Determinants of Health (SDOH) Interventions SDOH Screenings   Food Insecurity: No Food Insecurity (03/19/2024)  Housing: Low Risk  (03/19/2024)  Transportation Needs: No Transportation Needs (03/19/2024)  Utilities: Not At Risk (03/19/2024)  Depression (PHQ2-9): Low Risk  (05/12/2023)  Social Connections: Moderately Integrated (03/19/2024)  Tobacco Use: Low Risk  (03/22/2024)    Readmission Risk Interventions     No data to display

## 2024-03-27 NOTE — Progress Notes (Signed)
 Physical Therapy Treatment Patient Details Name: Rachel Black MRN: [id number] DOB: [birth year] Today's Date: 03/27/2024   History of Present Illness Pt is a 73 y.o. F who presents after a fall with CT L ankle trimalleolar fx. PMH includes: arthritis, DM, gout, PFO, CVA, HTN, ERCP, TKA bil., CAD    PT Comments  POD # 5 pm session Pt tolerated OOB in recliner from 12:30 pm till 2:00 pm.  Lateral/Scoot Transfers: Max assist, +2 physical assistance, +2 safety/equipment   General transfer comment: Pt was unable to perform a traditional sit to stand from lower recliner level at NWBing L LE.  Assisted back to bed via Lateral Scoot from drop arm recliner slightly upward to bed level + 2 Max/Total Assist using bed sheet that was already under Pt.   Positioned to comfort back to bed with L LE elevated. LPT has rec Pt will need ST Rehab at SNF to address mobility and functional decline prior to safely returning home.    If plan is discharge home, recommend the following: Two people to help with walking and/or transfers;Two people to help with bathing/dressing/bathroom;Assistance with cooking/housework;Assist for transportation;Help with stairs or ramp for entrance   Can travel by private vehicle     No  Equipment Recommendations  BSC/3in1;Wheelchair (measurements PT);Wheelchair cushion (measurements PT)    Recommendations for Other Services       Precautions / Restrictions Precautions Precautions: Fall Precaution/Restrictions Comments: difficulty with recall and observation of L LE NWB status Restrictions Weight Bearing Restrictions Per Provider Order: Yes LLE Weight Bearing Per Provider Order: Non weight bearing     Mobility  Bed Mobility Overal bed mobility: Needs Assistance Bed Mobility: Supine to Sit     Supine to sit: Mod assist, Max assist Sit to supine: Mod assist, Max assist, +2 for physical assistance, +2 for safety/equipment   General bed mobility comments: Required + 2  assist back to bed then position to comfort.    Transfers Overall transfer level: Needs assistance   Transfers: Bed to chair/wheelchair/BSC       Squat pivot transfers: Mod assist, +2 physical assistance, +2 safety/equipment    Lateral/Scoot Transfers: Max assist, +2 physical assistance, +2 safety/equipment General transfer comment: Pt was unable to perform a traditional sit to stand from lower recliner level at NWBing L LE.  Assisted back to bed via Lateral Scoot from drop arm recliner slightly upward to bed level + 2 Max/Total Assist.    Ambulation/Gait               General Gait Details: Transfers only this session   Stairs             Wheelchair Mobility     Tilt Bed    Modified Rankin (Stroke Patients Only)       Balance                                            Communication Communication Communication: No apparent difficulties  Cognition Arousal: Alert Behavior During Therapy: WFL for tasks assessed/performed   PT - Cognitive impairments: No apparent impairments                       PT - Cognition Comments: improved cognition and level of alertness.  Following all commands.  Pleasant.  Funny. Following commands: Intact      Cueing Cueing  Techniques: Verbal cues  Exercises      General Comments        Pertinent Vitals/Pain Pain Assessment Pain Assessment: Faces Faces Pain Scale: Hurts a little bit Pain Location: L ankle Pain Descriptors / Indicators: Aching, Sore, Throbbing Pain Intervention(s): Monitored during session, Repositioned    Home Living                          Prior Function            PT Goals (current goals can now be found in the care plan section) Progress towards PT goals: Progressing toward goals    Frequency    Min 2X/week      PT Plan      Co-evaluation              AM-PAC PT 6 Clicks Mobility   Outcome Measure  Help needed turning from your  back to your side while in a flat bed without using bedrails?: A Lot Help needed moving from lying on your back to sitting on the side of a flat bed without using bedrails?: A Lot Help needed moving to and from a bed to a chair (including a wheelchair)?: A Lot Help needed standing up from a chair using your arms (e.g., wheelchair or bedside chair)?: A Lot Help needed to walk in hospital room?: Total Help needed climbing 3-5 steps with a railing? : Total 6 Click Score: 10    End of Session Equipment Utilized During Treatment: Gait belt Activity Tolerance: Patient limited by fatigue Patient left: in bed Nurse Communication: Mobility status PT Visit Diagnosis: Unsteadiness on feet (R26.81);History of falling (Z91.81);Muscle weakness (generalized) (M62.81);Difficulty in walking, not elsewhere classified (R26.2)     Time: 8595-8579 PT Time Calculation (min) (ACUTE ONLY): 16 min  Charges:    $Therapeutic Activity: 8-22 mins PT General Charges $$ ACUTE PT VISIT: 1 Visit                      Katheryn Leap  PTA Acute  Rehabilitation Services Office M-F          [phone]

## 2024-03-27 NOTE — TOC Progression Note (Addendum)
 Transition of Care [facility]) - Progression Note    Patient Details  Name: Rachel Black MRN: [id number] Date of Birth: 30-Sep-1950  [facility] CM/SW Contact  Alfonse JONELLE Rex, RN Phone Number: 03/27/2024, 9:44 AM  Clinical Narrative:  Per Yolande with [facility], bed available tomorrow or Wednesday. NCM requested Kia to initiate auth with [employer] PPO, await auth. Team notified.  11:21am  Text received from Kia with [employer], states insurance is OON with facility. Met with pt at bedside to review other SNF bed offers ( [address] [place], [place]), [facility] called to patient's spouse mobile, no answer, no vm. Patient provided spouse's work phone, no answer, vm left with NCM name and phone number for spouse to return call. Patient states spouse plans to visit her today after work. NCM left list of SNF bed offers at bedside with note request to call NCM, await call from spouse.     -12:01pm Call received from patient's spouse, states patient did admit to [facility] under his same insurance but he paid out of pocket and was reimbursed, states he will review other SNF bed offers when he arrives to hospital after work today.     Expected Discharge Plan: Skilled Nursing Facility Barriers to Discharge: Continued Medical Work up, English as a second language teacher  Expected Discharge Plan and Services In-house Referral: Clinical Social Work   Post Acute Care Choice: Skilled Nursing Facility Living arrangements for the past 2 months: Single Family Home                                       Social Determinants of Health (SDOH) Interventions SDOH Screenings   Food Insecurity: No Food Insecurity (03/19/2024)  Housing: Low Risk  (03/19/2024)  Transportation Needs: No Transportation Needs (03/19/2024)  Utilities: Not At Risk (03/19/2024)  Depression (PHQ2-9): Low Risk  (05/12/2023)  Social Connections: Moderately Integrated (03/19/2024)  Tobacco Use: Low Risk  (03/22/2024)    Readmission  Risk Interventions     No data to display

## 2024-03-27 NOTE — Progress Notes (Signed)
 Physical Therapy Treatment Patient Details Name: Rachel Black MRN: [id number] DOB: [birth year] Today's Date: 03/27/2024   History of Present Illness Pt is a 73 y.o. F who presents after a fall with CT L ankle trimalleolar fx. PMH includes: arthritis, DM, gout, PFO, CVA, HTN, ERCP, TKA bil., CAD    PT Comments  PT - Cognition Comments: improved cognition and level of alertness.  Following all commands.  Pleasant.  Funny. Assisted OOB to recliner was difficult.  General bed mobility comments: Pt required Mod Assist for upper body then Max Assist to complete scooting to EOB. General transfer comment: assisted from elevated bed to 1/4 pivot squat pivot while NWBing L LE required much effort and + 2 assist. Positioned in recliner to comfort and elevated L LE. Please don't leave me in this chair as long as last time, stated Pt.  Agreed I would return around 2 pm to assist back to bed.    If plan is discharge home, recommend the following: Two people to help with walking and/or transfers;Two people to help with bathing/dressing/bathroom;Assistance with cooking/housework;Assist for transportation;Help with stairs or ramp for entrance   Can travel by private vehicle     No  Equipment Recommendations  BSC/3in1;Wheelchair (measurements PT);Wheelchair cushion (measurements PT)    Recommendations for Other Services       Precautions / Restrictions Precautions Precautions: Fall Precaution/Restrictions Comments: difficulty with recall and observation of L LE NWB status Restrictions Weight Bearing Restrictions Per Provider Order: Yes LLE Weight Bearing Per Provider Order: Non weight bearing     Mobility  Bed Mobility Overal bed mobility: Needs Assistance Bed Mobility: Supine to Sit     Supine to sit: Mod assist, Max assist     General bed mobility comments: Pt required Mod Assist for upper body then Max Assist to complete scooting to EOB.    Transfers Overall transfer level: Needs  assistance   Transfers: Bed to chair/wheelchair/BSC       Squat pivot transfers: Mod assist, +2 physical assistance, +2 safety/equipment     General transfer comment: assisted from elevated bed to 1/4 pivot squat pivot while NWBing L LE required much effort and + 2 assist.    Ambulation/Gait               General Gait Details: Transfers only this session   Stairs             Wheelchair Mobility     Tilt Bed    Modified Rankin (Stroke Patients Only)       Balance                                            Communication Communication Communication: No apparent difficulties  Cognition Arousal: Alert Behavior During Therapy: WFL for tasks assessed/performed   PT - Cognitive impairments: No apparent impairments                       PT - Cognition Comments: improved cognition and level of alertness.  Following all commands.  Pleasant.  Funny. Following commands: Intact      Cueing Cueing Techniques: Verbal cues  Exercises      General Comments        Pertinent Vitals/Pain Pain Assessment Pain Assessment: Faces Faces Pain Scale: Hurts a little bit Pain Location: L ankle Pain Descriptors / Indicators: Aching, Sore,  Throbbing Pain Intervention(s): Monitored during session, Repositioned    Home Living                          Prior Function            PT Goals (current goals can now be found in the care plan section) Progress towards PT goals: Progressing toward goals    Frequency    Min 2X/week      PT Plan      Co-evaluation              AM-PAC PT 6 Clicks Mobility   Outcome Measure  Help needed turning from your back to your side while in a flat bed without using bedrails?: A Lot Help needed moving from lying on your back to sitting on the side of a flat bed without using bedrails?: A Lot Help needed moving to and from a bed to a chair (including a wheelchair)?: A Lot Help  needed standing up from a chair using your arms (e.g., wheelchair or bedside chair)?: A Lot Help needed to walk in hospital room?: Total Help needed climbing 3-5 steps with a railing? : Total 6 Click Score: 10    End of Session Equipment Utilized During Treatment: Gait belt Activity Tolerance: Patient limited by fatigue Patient left: in chair;with call bell/phone within reach Nurse Communication: Mobility status PT Visit Diagnosis: Unsteadiness on feet (R26.81);History of falling (Z91.81);Muscle weakness (generalized) (M62.81);Difficulty in walking, not elsewhere classified (R26.2)     Time: 1222-1238 PT Time Calculation (min) (ACUTE ONLY): 16 min  Charges:    $Therapeutic Activity: 8-22 mins PT General Charges $$ ACUTE PT VISIT: 1 Visit                     Katheryn Leap  PTA Acute  Rehabilitation Services Office M-F          [phone]

## 2024-03-28 ENCOUNTER — Other Ambulatory Visit (HOSPITAL_COMMUNITY): Payer: Self-pay

## 2024-03-28 DIAGNOSIS — S82852A Displaced trimalleolar fracture of left lower leg, initial encounter for closed fracture: Secondary | ICD-10-CM | POA: Diagnosis not present

## 2024-03-28 MED ORDER — CYANOCOBALAMIN 1000 MCG PO TABS: 1000.0000 ug | ORAL_TABLET | Freq: Every day | ORAL | Status: AC

## 2024-03-28 MED ORDER — OXYCODONE HCL 5 MG PO TABS: 5.0000 mg | ORAL_TABLET | ORAL | 0 refills | Status: AC | PRN

## 2024-03-28 MED ORDER — FOLIC ACID 1 MG PO TABS: 1.0000 mg | ORAL_TABLET | Freq: Every day | ORAL | Status: AC

## 2024-03-28 NOTE — Plan of Care (Signed)
  Problem: Pain Managment: Goal: General experience of comfort will improve and/or be controlled Outcome: Progressing   Problem: Activity: Goal: Risk for activity intolerance will decrease Outcome: Progressing   Problem: Safety: Goal: Ability to remain free from injury will improve Outcome: Progressing

## 2024-03-28 NOTE — Discharge Summary (Signed)
 Physician Discharge Summary  SOLEIL MAS FMW:[id number] DOB: April 07, 1951 DOA: 03/16/2024  PCP: Wallace Joesph LABOR, PA  Admit date: 03/16/2024 Discharge date: 03/28/2024  Admitted From: Home  Discharge disposition: Skilled nursing facility   Recommendations for Outpatient Follow-Up:   Follow up with your primary care provider at the skilled nursing facility in 3 to 5 days. Follow-up with Dr. Barton orthopedics in 1 week for wound checkup, exchange of splint. Check CBC, BMP, magnesium  in the next visit   Discharge Diagnosis:   Principal Problem:   Left trimalleolar fracture Active Problems:   Fall at home, initial encounter   Transient hypotension   Macrocytic anemia   Generalized abdominal pain   Irritable bowel syndrome with constipation   Mixed hyperlipidemia   GERD without esophagitis   Discharge Condition: Improved.  Diet recommendation: Regular.  Wound care: None.  Code status: Full.   History of Present Illness:    Rachel Black is a 73 y.o. female with past medical history significant for hypertension, hyperlipidemia, CVA, DM 2, gout, chronic abdominal pain, IBS, GERD presented to the hospital  on 03/16/2024 with left ankle pain following a fall at home.  Patient was noted to have  left trimalleolar fracture secondary to fall and underwent ORIF without internal fixation of posterior malleolus and left ankle syndesmosis by Dr. Barton on 7/9.    Hospital Course:   Following conditions were addressed during hospitalization as listed below,  Left trimalleolar fracture secondary to fall Status post ORIF 7/9, by Dr. Barton.  PT has recommended skilled nursing facility.  Follow-up with orthopedics as outpatient in 7 to 10 days   Strict nonweightbearing and continue short leg splint.  Patient will be on aspirin  twice daily for DVT prophylaxis.     Transient hypotension: Resolved Hx HTN  Patient takes Imdur  and olmesartan  at home.  Will resume on  discharge.   Macrocytic anemia Acute on chronic.  Hemoglobin noted to be at 8.5 from 9.7, but had previously been 12.3 on 03/15/2024.  Anemia panel with low folate and B12 will continue to replace.  Latest hemoglobin of 9.4.    Chronic abdominal pain History of IBS with constipation Acute on chronic.  Follows up with  [facility] as outpatient.  Continue MiraLAX , Senokot.  Currently stable.   Renal insufficiency Acute.  Creatinine noted to be elevated up to 1.38 with BUN 32.  Baseline creatinine previously had been around 1-1.1.  Latest creatinine at 1.0.     Mixed hyperlipidemia  Continue Crestor  and fenofibrate .   GERD Continue Protonix  and Pepcid    Hx Gout Continue allopurinol    Obesity, class I Body mass index is 31.81 kg/m.  Would benefit from ongoing weight loss as outpatient.    Disposition.  At this time, patient is stable for disposition to skilled nursing facility with outpatient orthopedic follow-up.  Medical Consultants:   Orthopedics  Procedures:     Open reduction and internal fixation of the left ankle fracture on 03/22/2024.  Subjective:   Today, patient was seen and examined at bedside.  Denies interval complaints.  Mild discomfort but otherwise no nausea vomiting shortness of breath dyspnea or chest pain.  Discharge Exam:   Vitals:   03/27/24 2050 03/28/24 0546  BP: 116/71 110/64  Pulse: 82 71  Resp: 18 18  Temp: 98.4 F (36.9 C) 98 F (36.7 C)  SpO2: 98% 97%   Vitals:   03/27/24 0535 03/27/24 1304 03/27/24 2050 03/28/24 0546  BP: 106/65 121/86 116/71 110/64  Pulse: 68 68 82 71  Resp: 18 16 18 18   Temp: 97.7 F (36.5 C) 99 F (37.2 C) 98.4 F (36.9 C) 98 F (36.7 C)  TempSrc: Oral Oral Oral Oral  SpO2: 100% 94% 98% 97%  Weight:      Height:       Body mass index is 31.81 kg/m.  General: Alert awake, not in obvious distress, obese built HENT: pupils equally reacting to light,  No scleral pallor or icterus noted. Oral mucosa is moist.   Chest:  Clear breath sounds.  Diminished breath sounds bilaterally. No crackles or wheezes.  CVS: S1 &S2 heard. No murmur.  Regular rate and rhythm. Abdomen: Soft, nontender, nondistended.  Bowel sounds are heard.   Extremities:   Left ankle with splint and elastic compression bandage Psych: Alert, awake and oriented, normal mood CNS:  No cranial nerve deficits.  Power equal in all extremities.   Skin: Warm and dry.  No rashes noted.  The results of significant diagnostics from this hospitalization (including imaging, microbiology, ancillary and laboratory) are listed below for reference.     Diagnostic Studies:   CT Ankle Left Wo Contrast Result Date: 03/16/2024 CLINICAL DATA:  Injury after fall. EXAM: CT OF THE LEFT ANKLE WITHOUT CONTRAST TECHNIQUE: Multidetector CT imaging of the left ankle was performed according to the standard protocol. Multiplanar CT image reconstructions were also generated. RADIATION DOSE REDUCTION: This exam was performed according to the departmental dose-optimization program which includes automated exposure control, adjustment of the mA and/or kV according to patient size and/or use of iterative reconstruction technique. COMPARISON:  Same day left ankle radiographs dated 03/16/2024. FINDINGS: Bones/Joint/Cartilage Minimally displaced fracture of the medial malleolus. Oblique fracture of the distal fibular metaphysis with the distal extent of the fracture at the level of the syndesmosis. There is approximately 5-6 mm of anterior distraction of the distal fracture component. Minimally displaced fracture at the lateral margin of the posterior malleolus (series 9, image 67 and series 3, image 156). There is mild lateral rotation of the talus relative to the tibial plafond with associated asymmetric narrowing of the posteromedial clear space of the ankle mortise and relative widening laterally. Asymmetric tibiotalar joint space narrowing with the posterolateral tibial plafond  abutting the posterior margin of the talar dome which demonstrates cortical irregularity/concavity, which could be secondary to degenerative change, however, a nondisplaced fracture cannot be entirely excluded (series 9, image 64). Marginal tibial plafond osteophytosis. Diffuse osseous demineralization. Well corticated ossicles adjacent to the base of the navicular measuring up to 10 mm may reflect accessory navicular or sequela prior trauma. Os peroneum is noted. Calcaneal enthesopathy at the insertion of the Achilles tendon and the origin of the central cord of the plantar fascia. Mild degenerative changes of the midfoot. Ligaments Ligaments are suboptimally evaluated by CT. Muscles and Tendons Muscles are normal. No muscle atrophy. No intramuscular fluid collection or hematoma. Soft tissue Soft tissue swelling of the ankle, most pronounced medially and anterolaterally. No loculated fluid collection. IMPRESSION: 1. Oblique distracted fracture of the distal fibular metaphysis with the distal extent of the fracture at the level of the syndesmosis (Weber type B). 2. Minimally displaced fracture of the medial malleolus. 3. Minimally displaced fracture at the lateral margin of the posterior malleolus 4. Mild lateral rotation of the talus relative to the tibial plafond with associated asymmetric narrowing of the posteromedial clear space of the ankle mortise and relative widening laterally. 5. Asymmetric tibiotalar joint space narrowing with the posterolateral tibial  plafond abutting the posterior margin of the talar dome which demonstrates cortical irregularity/concavity, which could be secondary to degenerative change, however, a nondisplaced fracture cannot be entirely excluded. Electronically Signed   By: Harrietta Sherry M.D.   On: 03/16/2024 13:11   CT Head Wo Contrast Result Date: 03/16/2024 CLINICAL DATA:  73 year old female status post fall.  Pain. EXAM: CT HEAD WITHOUT CONTRAST TECHNIQUE: Contiguous axial  images were obtained from the base of the skull through the vertex without intravenous contrast. RADIATION DOSE REDUCTION: This exam was performed according to the departmental dose-optimization program which includes automated exposure control, adjustment of the mA and/or kV according to patient size and/or use of iterative reconstruction technique. COMPARISON:  Brain MRI 03/14/2017.  Head CT 05/11/2023. FINDINGS: Brain: Stable cerebral volume. Patchy chronic encephalomalacia from previous right MCA middle or posterior division cortical infarct. No midline shift, ventriculomegaly, mass effect, evidence of mass lesion, intracranial hemorrhage or evidence of cortically based acute infarction. Small chronic left cerebellar infarct also stable. Gray-white differentiation elsewhere is within normal limits for age. Vascular: Calcified atherosclerosis at the skull base. No suspicious intracranial vascular hyperdensity. Skull: Stable and intact. Sinuses/Orbits: Visualized paranasal sinuses and mastoids are stable and well aerated. Other: No acute orbit or scalp soft tissue injury identified. IMPRESSION: 1. No acute traumatic injury identified. 2. 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 Electronically Signed   By: VEAR Hurst M.D.   On: 03/16/2024 12:41   DG Hip Unilat W or Wo Pelvis 2-3 Views Left Result Date: 03/16/2024 CLINICAL DATA:  73 year old female status post fall.  Pain. EXAM: DG HIP (WITH OR WITHOUT PELVIS) 2-3V LEFT COMPARISON:  CT Abdomen and Pelvis yesterday. FINDINGS: Three views at 1058 hours. Excreted IV contrast from the CT yesterday in nondistended urinary bladder. Bone mineralization is within normal limits for age. Calcified iliofemoral atherosclerosis. Femoral heads normally located. No pelvis fracture identified. Proximal left femur appears intact. Nonobstructed bowel-gas pattern. IMPRESSION: 1. No acute fracture or dislocation  identified about the left hip or pelvis. 2. Excreted IV contrast in the urinary bladder from CT yesterday. Electronically Signed   By: VEAR Hurst M.D.   On: 03/16/2024 11:11   DG Ankle Complete Left Result Date: 03/16/2024 CLINICAL DATA:  Pain after fall EXAM: LEFT ANKLE COMPLETE - 3 VIEW COMPARISON:  None Available. FINDINGS: Severe osteopenia. Soft tissue swelling about the ankle. There is a minimally displaced fracture along the distal tibia at the base of the medial malleolus. Is also a distal fibular metaphyseal mildly displaced oblique fracture. No dislocation. Slight degenerative changes. Calcaneal plantar spur. There are some calcifications along the course of the distal Achilles tendon as well. Please correlate for symptoms tendinosis. Scattered vascular calcifications. IMPRESSION: Mildly displaced fractures of the base of the medial malleolus and the distal fibular metaphysis. Soft tissue swelling. Osteopenia and chronic changes as well. Electronically Signed   By: Ranell Bring M.D.   On: 03/16/2024 11:10     Labs:   Basic Metabolic Panel: Recent Labs  Lab 03/22/24 0353 03/22/24 1731 03/23/24 0438 03/24/24 0337 03/24/24 1447 03/25/24 0344 03/27/24 0339  NA 134* 137  --  139 138  --  137  K 4.0 4.3  --  4.3 3.8  --  3.9  CL 105 105  --  107 104  --  105  CO2 23 22  --  23 25  --  22  GLUCOSE 103* 178*  --  100* 143*  --  97  BUN 25* 22  --  32* 32*  --  26*  CREATININE 1.23* 1.39*  --  1.04* 1.02*  --  1.09*  CALCIUM  9.0 9.6  --  8.7* 8.9  --  9.0  MG  --   --  1.9 1.7  --  1.7  --    GFR Estimated Creatinine Clearance: 53.3 mL/min (A) (by C-G formula based on SCr of 1.09 mg/dL (H)). Liver Function Tests: Recent Labs  Lab 03/22/24 1731 03/24/24 1447  AST 20 15  ALT 15 8  ALKPHOS 45 48  BILITOT 0.4 0.3  PROT 5.7* 5.2*  ALBUMIN  3.1* 2.7*   No results for input(s): LIPASE, AMYLASE in the last 168 hours. No results for input(s): AMMONIA in the last 168  hours. Coagulation profile No results for input(s): INR, PROTIME in the last 168 hours.  CBC: Recent Labs  Lab 03/22/24 0353 03/23/24 0438 03/24/24 0337 03/25/24 0344 03/27/24 0339  WBC 5.7 7.2 6.8 6.6 6.6  HGB 8.7* 8.6* 8.3* 8.5* 9.4*  HCT 27.6* 25.8* 26.6* 27.4* 29.1*  MCV 100.7* 100.8* 104.3* 105.0* 103.2*  PLT 156 152 154 160 165   Cardiac Enzymes: No results for input(s): CKTOTAL, CKMB, CKMBINDEX, TROPONINI in the last 168 hours. BNP: Invalid input(s): POCBNP CBG: Recent Labs  Lab 03/22/24 1239  GLUCAP 103*   D-Dimer No results for input(s): DDIMER in the last 72 hours. Hgb A1c No results for input(s): HGBA1C in the last 72 hours. Lipid Profile No results for input(s): CHOL, HDL, LDLCALC, TRIG, CHOLHDL, LDLDIRECT in the last 72 hours. Thyroid  function studies No results for input(s): TSH, T4TOTAL, T3FREE, THYROIDAB in the last 72 hours.  Invalid input(s): FREET3 Anemia work up No results for input(s): VITAMINB12, FOLATE, FERRITIN, TIBC, IRON, RETICCTPCT in the last 72 hours. Microbiology Recent Results (from the past 240 hours)  Surgical pcr screen     Status: None   Collection Time: 03/21/24  8:54 PM   Specimen: Nasal Mucosa; Nasal Swab  Result Value Ref Range Status   MRSA, PCR NEGATIVE NEGATIVE Final   Staphylococcus aureus NEGATIVE NEGATIVE Final    Comment: (NOTE) The Xpert SA Assay (FDA approved for NASAL specimens in patients 58 years of age and older), is one component of a comprehensive surveillance program. It is not intended to diagnose infection nor to guide or monitor treatment. Performed at [facility], [address] [place], [place] [address]      Discharge Instructions:   Discharge Instructions     Call MD for:  persistant nausea and vomiting   Complete by: As directed    Call MD for:  severe uncontrolled pain   Complete by: As directed    Call MD for:   temperature >100.4   Complete by: As directed    Diet - low sodium heart healthy   Complete by: As directed    Discharge instructions   Complete by: As directed    Follow-up with your primary care provider in 1 week at this closing facility.  Follow-up with orthopedics Dr Barton in 2 weeks.  Seek medical attention for worsening symptoms.   Increase activity slowly   Complete by: As directed    Nonweightbearing to the affected extremity   No wound care   Complete by: As directed       Allergies as of 03/28/2024       Reactions   Other Other (See Comments)   Some BANDAIDS  cause skin irritation    Penicillins Itching   Tolerated Zosyn  couse 10/04/22        Medication List     TAKE these medications    Accu-Chek Aviva Plus test strip Generic drug: glucose blood USE AS INSTRUCTED ONCE A DAY. DX CODE E11.9   Accu-Chek Softclix Lancets lancets Use as instructed once a day.  DX Code: E11.9   acetaminophen  500 MG tablet Commonly known as: TYLENOL  Take 1,000 mg by mouth every 6 (six) hours as needed for mild pain or moderate pain.   allopurinol  300 MG tablet Commonly known as: ZYLOPRIM  TAKE 1 TABLET BY MOUTH 2 TIMES DAILY.   aspirin  EC 81 MG tablet Commonly known as: Aspirin  Low Dose Take 1 tablet by mouth twice a day for 42 days.   CALCIUM  + VITAMIN D3 PO Take 2 tablets by mouth daily.   cyanocobalamin  1000 MCG tablet Take 1 tablet (1,000 mcg total) by mouth daily. Start taking on: March 29, 2024   docusate sodium  100 MG capsule Commonly known as: Colace Take 1 capsule by mouth twice a day for 28 days.   DULoxetine  60 MG capsule Commonly known as: CYMBALTA  Take 1 capsule (60 mg total) by mouth daily.   famotidine  10 MG tablet Commonly known as: PEPCID  Take 10 mg by mouth daily as needed for heartburn or indigestion.   fenofibrate  160 MG tablet Take 1 tablet (160 mg total) by mouth daily.   ferrous sulfate  325 (65 FE) MG tablet TAKE 1 TABLET BY MOUTH  TWICE A DAY   folic acid  1 MG tablet Commonly known as: FOLVITE  Take 1 tablet (1 mg total) by mouth daily. Start taking on: March 29, 2024   gabapentin  300 MG capsule Commonly known as: NEURONTIN  TAKE 1 CAPSULE BY MOUTH EVERYDAY AT BEDTIME   isosorbide  mononitrate 30 MG 24 hr tablet Commonly known as: IMDUR  TAKE 1 TABLET BY MOUTH EVERY DAY   olmesartan  5 MG tablet Commonly known as: BENICAR  Take 1 tablet (5 mg total) by mouth daily.   ondansetron  4 MG disintegrating tablet Commonly known as: ZOFRAN -ODT Place 1 tablet in mouth and allow to dissolve every 8 hours as needed for 5 days, for nausea.   oxyCODONE  5 MG immediate release tablet Commonly known as: Oxy IR/ROXICODONE  Take 1 tablet by mouth every 4 (four) hours as needed for severe pain (pain score 7-10) (ankle pain) for up to 7 days.   pantoprazole  40 MG tablet Commonly known as: PROTONIX  TAKE 1 TABLET BY MOUTH EVERY DAY   polyethylene glycol 17 g packet Commonly known as: MIRALAX  / GLYCOLAX  Take 17 g by mouth daily as needed for moderate constipation.   rosuvastatin  40 MG tablet Commonly known as: CRESTOR  TAKE 1 TABLET BY MOUTH EVERY DAY   Udderly Smooth Crea Apply 1 application topically See admin instructions. Apply topically to skin folds as needed for rash/irritation        Contact information for follow-up providers     Barton Drape, MD Follow up in 1 week(s).   Specialty: Orthopedic Surgery Why: for wound checkup, exchange of splint Contact information: [address] [place] [address] [phone]              Contact information for after-discharge care     Destination     [facility] .   Service: Skilled Nursing Contact information: [address] [place]  [phone]  Time coordinating discharge: 39 minutes  Signed:  Desire Fulp  [facility] 03/28/2024, 10:11  AM

## 2024-03-28 NOTE — Progress Notes (Signed)
 Report given to the receiving nurse Amber, LPN at [facility]. No concerns made. Pt aware about the transfer. Awaiting on PTAR for transportation.

## 2024-03-28 NOTE — TOC Transition Note (Signed)
 Transition of Care [facility]) - Discharge Note   Patient Details  Name: Rachel Black MRN: [id number] Date of Birth: 01-14-1951  [facility] CM/SW Contact:  Rachel JONELLE Rex, RN Phone Number: 03/28/2024, 10:36 AM   Clinical Narrative:   DC to SNF [facility]) for short term rehab. RM 117a, Call Report [phone]. PTAR for transport. No further TOC needs identified at this time.     Final next level of care: Skilled Nursing Facility Barriers to Discharge: Continued Medical Work up, English as a second language teacher   Patient Goals and CMS Choice Patient states their goals for this hospitalization and ongoing recovery are:: to discharge to [facility] CMS Medicare.gov Compare Post Acute Care list provided to:: Patient Choice offered to / list presented to : Patient [place] ownership interest in [facility].provided to:: Patient    Discharge Placement              Patient chooses bed at: [facility] Patient to be transferred to facility by: PTAR Name of family member notified: Seferina, Brokaw (Spouse)  [phone] [facility]) Patient and family notified of of transfer: 03/28/24  Discharge Plan and Services Additional resources added to the After Visit Summary for   In-house Referral: Clinical Social Work   Post Acute Care Choice: Skilled Nursing Facility                               Social Drivers of Health (SDOH) Interventions SDOH Screenings   Food Insecurity: No Food Insecurity (03/19/2024)  Housing: Low Risk  (03/19/2024)  Transportation Needs: No Transportation Needs (03/19/2024)  Utilities: Not At Risk (03/19/2024)  Depression (PHQ2-9): Low Risk  (05/12/2023)  Social Connections: Moderately Integrated (03/19/2024)  Tobacco Use: Low Risk  (03/22/2024)     Readmission Risk Interventions    03/28/2024   10:35 AM  Readmission Risk Prevention Plan  Transportation Screening Complete  PCP or Specialist Appt within 5-7 Days  Complete  Home Care Screening Complete  Medication Review (RN CM) Complete

## 2024-03-29 ENCOUNTER — Encounter (HOSPITAL_COMMUNITY): Payer: Self-pay | Admitting: Orthopaedic Surgery

## 2024-04-30 ENCOUNTER — Other Ambulatory Visit: Payer: Self-pay | Admitting: Family Medicine

## 2024-04-30 DIAGNOSIS — F419 Anxiety disorder, unspecified: Secondary | ICD-10-CM

## 2024-04-30 DIAGNOSIS — K581 Irritable bowel syndrome with constipation: Secondary | ICD-10-CM

## 2024-05-14 ENCOUNTER — Other Ambulatory Visit: Payer: Self-pay | Admitting: Family Medicine

## 2024-05-14 DIAGNOSIS — E1169 Type 2 diabetes mellitus with other specified complication: Secondary | ICD-10-CM

## 2024-05-20 ENCOUNTER — Other Ambulatory Visit: Payer: Self-pay | Admitting: Family Medicine

## 2024-05-20 DIAGNOSIS — M1000 Idiopathic gout, unspecified site: Secondary | ICD-10-CM

## 2024-07-15 ENCOUNTER — Other Ambulatory Visit: Payer: Self-pay

## 2024-07-15 ENCOUNTER — Encounter (HOSPITAL_COMMUNITY): Payer: Self-pay | Admitting: *Deleted

## 2024-07-15 ENCOUNTER — Emergency Department (HOSPITAL_COMMUNITY)

## 2024-07-15 ENCOUNTER — Emergency Department (HOSPITAL_COMMUNITY)
Admission: EM | Admit: 2024-07-15 | Discharge: 2024-07-15 | Disposition: A | Discharge status: Home / Self Care | Attending: Emergency Medicine | Admitting: Emergency Medicine

## 2024-07-15 DIAGNOSIS — G8929 Other chronic pain: Secondary | ICD-10-CM | POA: Insufficient documentation

## 2024-07-15 DIAGNOSIS — Z7982 Long term (current) use of aspirin: Secondary | ICD-10-CM | POA: Diagnosis not present

## 2024-07-15 DIAGNOSIS — M25572 Pain in left ankle and joints of left foot: Secondary | ICD-10-CM | POA: Diagnosis present

## 2024-07-15 DIAGNOSIS — W1830XA Fall on same level, unspecified, initial encounter: Secondary | ICD-10-CM | POA: Diagnosis not present

## 2024-07-15 NOTE — ED Triage Notes (Addendum)
 Pt here via PT[facility] from [facility].  She was being discharged and transferring from her bed to the wheelchair when she said she hurt her ankle.  L ankle swollen (Pt has been at the facility since July for surgery to that ankle).  Staff did not states there was a fall.  Pt states she her ankle gave out and she landed on her bottom.  Pt also cannot states the date or time, though states husband was assisting transfer.

## 2024-07-15 NOTE — Discharge Instructions (Signed)
 Continue discussions regarding nursing home placement with your primary care physician and your insurance. Good luck to you.

## 2024-07-15 NOTE — ED Provider Notes (Signed)
 [place] EMERGENCY DEPARTMENT AT [facility] Provider Note   CSN: [id number] Arrival date & time: 07/15/24  1312     Patient presents with: Ankle Pain   Rachel Black is a 73 y.o. female.   Patient to ED for evaluation of left ankle pain. She states she fell while trying to pivot to her wheelchair, landing on the foot. This occurred at a rehab nursing home during discharge planning. She states at baseline she is unable to bear weight on that ankle and uses a wheelchair for mobility.   The history is provided by the patient and the spouse. No language interpreter was used.  Ankle Pain      Prior to Admission medications   Medication Sig Start Date End Date Taking? Authorizing Provider  ACCU-CHEK AVIVA PLUS test strip USE AS INSTRUCTED ONCE A DAY. DX CODE E11.9 07/20/22   Abonza, Maritza, PA-C  Accu-Chek Softclix Lancets lancets Use as instructed once a day.  DX Code: E11.9 03/24/22   Abonza, Maritza, PA-C  acetaminophen  (TYLENOL ) 500 MG tablet Take 1,000 mg by mouth every 6 (six) hours as needed for mild pain or moderate pain.    [provider]  allopurinol  (ZYLOPRIM ) 300 MG tablet TAKE 1 TABLET BY MOUTH TWICE A DAY 05/22/24   Chandra Toribio [name], MD  aspirin  EC (ASPIRIN  LOW DOSE) 81 MG tablet Take 1 tablet by mouth twice a day for 42 days. 03/21/24     Calcium  Carb-Cholecalciferol (CALCIUM  + VITAMIN D3 PO) Take 2 tablets by mouth daily.    [provider]  docusate sodium  (COLACE) 100 MG capsule Take 1 capsule by mouth twice a day for 28 days. 03/21/24     DULoxetine  (CYMBALTA ) 60 MG capsule Take 1 capsule (60 mg total) by mouth daily. 05/12/23   Wallace Search A, PA  Emollient (UDDERLY SMOOTH) CREA Apply 1 application topically See admin instructions. Apply topically to skin folds as needed for rash/irritation    [provider]  famotidine  (PEPCID ) 10 MG tablet Take 10 mg by mouth daily as needed for heartburn or indigestion.    [provider]   fenofibrate  160 MG tablet Take 1 tablet (160 mg total) by mouth daily. 05/12/23   Wallace Search LABOR, PA  ferrous sulfate  325 (65 FE) MG tablet TAKE 1 TABLET BY MOUTH TWICE A DAY 08/16/23   Abran Norleen SAILOR, MD  gabapentin  (NEURONTIN ) 300 MG capsule TAKE 1 CAPSULE BY MOUTH EVERYDAY AT BEDTIME 12/30/23   Chandra Toribio [name], MD  isosorbide  mononitrate (IMDUR ) 30 MG 24 hr tablet TAKE 1 TABLET BY MOUTH EVERY DAY 08/31/23   Daneen Damien BROCKS, NP  olmesartan  (BENICAR ) 5 MG tablet Take 1 tablet (5 mg total) by mouth daily. 05/12/23   Wallace Search LABOR, PA  ondansetron  (ZOFRAN -ODT) 4 MG disintegrating tablet Place 1 tablet in mouth and allow to dissolve every 8 hours as needed for 5 days, for nausea. 03/21/24     pantoprazole  (PROTONIX ) 40 MG tablet TAKE 1 TABLET BY MOUTH EVERY DAY 02/18/24   Abran Norleen SAILOR, MD  polyethylene glycol (MIRALAX  / GLYCOLAX ) 17 g packet Take 17 g by mouth daily as needed for moderate constipation.    [provider]  rosuvastatin  (CRESTOR ) 40 MG tablet TAKE 1 TABLET BY MOUTH EVERY DAY 07/28/23   Lavona Agent, MD    Allergies: Other and Penicillins    Review of Systems  Updated Vital Signs BP 120/71   Pulse (!) 106   Temp 97.9  F (36.6 C)   Resp 18   Ht 5' 6 (1.676 m)   Wt 89.4 kg   SpO2 93%   BMI 31.81 kg/m   Physical Exam Vitals and nursing note reviewed.  Constitutional:      General: She is not in acute distress.    Appearance: She is well-developed. She is not ill-appearing.  Pulmonary:     Effort: Pulmonary effort is normal.  Musculoskeletal:        General: Normal range of motion.     Cervical back: Normal range of motion.     Comments: Left foot and ankle without bruising or deformity. Chronic plantar flexion position with little variability. Foot is warm. No erythema.   Skin:    General: Skin is warm and dry.  Neurological:     Mental Status: She is alert and oriented to person, place, and time.     (all labs ordered are listed, but only abnormal  results are displayed) Labs Reviewed - No data to display  EKG: None  Radiology: DG Foot Complete Left Result Date: 07/15/2024 EXAM: 3 OR MORE VIEW(S) XRAY OF THE LEFT FOOT 07/15/2024 03:22:00 PM COMPARISON: None available. CLINICAL HISTORY: fall pain FINDINGS: BONES AND JOINTS: Diffuse osteopenia is noted. Moderate hallux valgus deformity of the first metatarsophalangeal joint is noted. Status post surgical fixation of distal left tibial and fibular fractures. Mild posterior calcaneal spurring is noted. No acute fracture. No joint dislocation. SOFT TISSUES: Mild diffuse soft tissue swelling is noted suggesting edema. IMPRESSION: 1. Status post surgical fixation  of distal left tibial and fibular fractures. 2. Mild diffuse soft tissue swelling. 3. Moderate hallux valgus deformity of the first metatarsophalangeal joint. 4. Mild posterior calcaneal spurring. 5. Diffuse osteopenia. Electronically signed by: Lynwood Seip MD 07/15/2024 03:33 PM EDT RP Workstation: [id number]   DG Ankle Complete Left Result Date: 07/15/2024 EXAM: 3 OR MORE VIEW(S) XRAY OF THE LEFT ANKLE 07/15/2024 03:22:00 PM CLINICAL HISTORY: fall, L ankle pain. COMPARISON: 03/16/2024 FINDINGS: BONES AND JOINTS: Status post surgical interfixation of distal left tibial and fibular fractures. Diffuse osteopenia is noted. No acute fracture. No focal osseous lesion. No joint dislocation. SOFT TISSUES: Vascular calcifications are noted. IMPRESSION: 1. No acute osseous abnormality. 2. Status post surgical fixation of distal left tibial and fibular fractures. 3. Diffuse osteopenia. 4. Vascular calcifications. Electronically signed by: Lynwood Seip MD 07/15/2024 03:30 PM EDT RP Workstation: [id number]     Procedures   Medications Ordered in the ED - No data to display  Clinical Course as of 07/15/24 1636  Sat Jul 15, 2024  1633 Patient to ED after reported fall onto left ankle. Imaging does not show any acute injury. These results were relayed  to the patient and husband. They express their concerns regarding discharge home and state that they prefer re-admission to the rehab nursing home.   Per ED CSW, the patient's insurance ran out and this was the reason for the discharge home today with plan for in-home therapy. CSW talked with the nursing home and re-admission is not available. Patient and husband updated and made aware. They are encouraged to engage the help of PCP if further nursing home placement is needed/preferred.  [SU]    Clinical Course User Index [SU] Odell Balls, PA-C                                 Medical Decision Making Amount and/or Complexity of Data  Reviewed Radiology: ordered.        Final diagnoses:  Acute left ankle pain  Chronic pain of left ankle    ED Discharge Orders     None          Odell Balls, PA-C 07/15/24 1636    Randol Simmonds, MD 07/16/24 1505

## 2024-07-15 NOTE — ED Notes (Signed)
 Ptar called

## 2024-07-15 NOTE — ED Provider Triage Note (Signed)
 Emergency Medicine Provider Triage Evaluation Note  Rachel Black , a 73 y.o. female  was evaluated in triage.  Pt complains of fall left foot and ankle injury.  Review of Systems  Positive: Foot and ankle pain Negative: Head injury  Physical Exam  BP 120/71   Pulse (!) 106   Temp 97.9 F (36.6 C)   Resp 18   Ht 5' 6 (1.676 m)   Wt 89.4 kg   SpO2 93%   BMI 31.81 kg/m  Gen:   Awake, no distress   Resp:  Normal effort  MSK:   diffuse tenderness and swollen left ankle Other:    Medical Decision Making  Medically screening exam initiated at 2:51 PM.  Appropriate orders placed.  Rachel Black was informed that the remainder of the evaluation will be completed by another provider, this initial triage assessment does not replace that evaluation, and the importance of remaining in the ED until their evaluation is complete.     Rachel Ozell BROCKS, MD 07/15/24 1452

## 2024-07-15 NOTE — Progress Notes (Signed)
 CSW received a call from [place], admissions interior and spatial designer at [facility] skilled nursing facility. CSW was told that patients insurance is no longer willing to pay for patient to stay in the nursing home. CSW was told that the patients husband had to pay privately for the past week until he was able to get the house ready for patient to come home. CSW was told that today was patient's discharge day and patient did sign her discharge paperwork. The husband came to pick patient up and patient refused to leave [facility]. CSW was told patient stated she fell and hurt her ankle. [facility] staff is not aware of patient falling and patients roommate also confirmed that patient didn't fall. The patient will need to discharge home when she is medically cleared due to no insurance coverage for SNF.

## 2024-07-17 ENCOUNTER — Telehealth: Payer: Self-pay | Admitting: Family Medicine

## 2024-07-17 ENCOUNTER — Telehealth: Payer: Self-pay

## 2024-07-17 NOTE — Telephone Encounter (Signed)
 Okay so no needs from us  at this time?

## 2024-07-17 NOTE — Telephone Encounter (Signed)
 Called pt spoke to husband he stated that rehab took X-rays no broken bones she is not mobile at this time no PT

## 2024-07-17 NOTE — Telephone Encounter (Unsigned)
 Copied from CRM [id number]. Topic: Clinical - Medical Advice >> Jul 17, 2024  1:06 PM Tiffini S wrote: Reason for CRM: Patient spouse Siedah Sedor states that she has surgery and was released on 11/01 for her foot/ she fell at rehab and is confined to the bed. He think she needs physical therapy but not sure- had a nurse coming to the home but patients needs to become mobile.   Please call the patient at [phone] for suggestions

## 2024-07-17 NOTE — Telephone Encounter (Signed)
 I can place an order for home PT. Did the rehab facility confirm that she didn't break anything when she fell?

## 2024-07-17 NOTE — Telephone Encounter (Signed)
 Copied from CRM [id number]. Topic: Clinical - Home Health Verbal Orders >> Jul 17, 2024  9:55 AM Burnard DEL wrote: Caller/Agency: Sharlet Kurtz Callback Number: [phone] Unable to give patient skilled nursing due to patient not having a nurse,because patient husband is at work,and they are not able to get into the home. Patient is bedbound. Patient needs 24/7 supervision. Patient is not able to get up and do anything for herself. Patients husband thought they  would be able to leave a key to access home to care for wife.But they are unable to do that. She needs ana available care giver.

## 2024-07-18 ENCOUNTER — Telehealth: Payer: Self-pay

## 2024-07-18 ENCOUNTER — Other Ambulatory Visit: Payer: Self-pay

## 2024-07-18 ENCOUNTER — Other Ambulatory Visit: Payer: Self-pay | Admitting: Family Medicine

## 2024-07-18 DIAGNOSIS — F419 Anxiety disorder, unspecified: Secondary | ICD-10-CM

## 2024-07-18 DIAGNOSIS — M25572 Pain in left ankle and joints of left foot: Secondary | ICD-10-CM

## 2024-07-18 DIAGNOSIS — K581 Irritable bowel syndrome with constipation: Secondary | ICD-10-CM

## 2024-07-18 DIAGNOSIS — S82852D Displaced trimalleolar fracture of left lower leg, subsequent encounter for closed fracture with routine healing: Secondary | ICD-10-CM

## 2024-07-18 NOTE — Telephone Encounter (Signed)
 Copied from CRM #[id number]. Topic: Clinical - Home Health Verbal Orders >> Jul 18, 2024 12:47 PM Shanda MATSU wrote: Reason for CRM: Patient's husband, JERRY E Kiel, calling back in regards to call that was supposed to have been made to him about physical therapy or a nurse for the patient, is req a call back.   Called pt spoke to husband he stating that she needs PT. She's bed bound he said that [employer] case manager told him to reach out to her  PCP. He said he don't know were to start.

## 2024-07-18 NOTE — Telephone Encounter (Signed)
 I have placed an order to home health PT

## 2024-07-19 ENCOUNTER — Emergency Department (HOSPITAL_COMMUNITY)

## 2024-07-19 ENCOUNTER — Emergency Department (HOSPITAL_COMMUNITY)
Admission: EM | Admit: 2024-07-19 | Discharge: 2024-07-20 | Disposition: A | Discharge status: Home / Self Care | Attending: Emergency Medicine | Admitting: Emergency Medicine

## 2024-07-19 ENCOUNTER — Other Ambulatory Visit: Payer: Self-pay

## 2024-07-19 ENCOUNTER — Ambulatory Visit: Payer: Self-pay

## 2024-07-19 ENCOUNTER — Encounter (HOSPITAL_COMMUNITY): Payer: Self-pay | Admitting: Emergency Medicine

## 2024-07-19 DIAGNOSIS — R5381 Other malaise: Secondary | ICD-10-CM | POA: Insufficient documentation

## 2024-07-19 DIAGNOSIS — N3000 Acute cystitis without hematuria: Secondary | ICD-10-CM | POA: Diagnosis not present

## 2024-07-19 DIAGNOSIS — I824Y2 Acute embolism and thrombosis of unspecified deep veins of left proximal lower extremity: Secondary | ICD-10-CM

## 2024-07-19 DIAGNOSIS — R1032 Left lower quadrant pain: Secondary | ICD-10-CM | POA: Diagnosis present

## 2024-07-19 DIAGNOSIS — Z7982 Long term (current) use of aspirin: Secondary | ICD-10-CM | POA: Insufficient documentation

## 2024-07-19 LAB — URINALYSIS, ROUTINE W REFLEX MICROSCOPIC
Bilirubin Urine: NEGATIVE
Glucose, UA: NEGATIVE mg/dL
Hgb urine dipstick: NEGATIVE
Ketones, ur: NEGATIVE mg/dL
Nitrite: POSITIVE — AB
Protein, ur: NEGATIVE mg/dL
Specific Gravity, Urine: 1.016 (ref 1.005–1.030)
pH: 7 (ref 5.0–8.0)

## 2024-07-19 LAB — CBC
HCT: 37.3 % (ref 36.0–46.0)
Hemoglobin: 12 g/dL (ref 12.0–15.0)
MCH: 31.8 pg (ref 26.0–34.0)
MCHC: 32.2 g/dL (ref 30.0–36.0)
MCV: 98.9 fL (ref 80.0–100.0)
Platelets: 155 K/uL (ref 150–400)
RBC: 3.77 MIL/uL — ABNORMAL LOW (ref 3.87–5.11)
RDW: 17.2 % — ABNORMAL HIGH (ref 11.5–15.5)
WBC: 5.8 K/uL (ref 4.0–10.5)
nRBC: 0 % (ref 0.0–0.2)

## 2024-07-19 LAB — COMPREHENSIVE METABOLIC PANEL WITH GFR
ALT: 12 U/L (ref 0–44)
AST: 16 U/L (ref 15–41)
Albumin: 2.7 g/dL — ABNORMAL LOW (ref 3.5–5.0)
Alkaline Phosphatase: 53 U/L (ref 38–126)
Anion gap: 10 (ref 5–15)
BUN: 9 mg/dL (ref 8–23)
CO2: 23 mmol/L (ref 22–32)
Calcium: 8.9 mg/dL (ref 8.9–10.3)
Chloride: 110 mmol/L (ref 98–111)
Creatinine, Ser: 0.89 mg/dL (ref 0.44–1.00)
GFR, Estimated: >60 mL/min (ref >60)
Glucose, Bld: 97 mg/dL (ref 70–99)
Potassium: 4.2 mmol/L (ref 3.5–5.1)
Sodium: 143 mmol/L (ref 135–145)
Total Bilirubin: 0.8 mg/dL (ref 0.0–1.2)
Total Protein: 5.1 g/dL — ABNORMAL LOW (ref 6.5–8.1)

## 2024-07-19 LAB — LIPASE, BLOOD: Lipase: 28 U/L (ref 11–51)

## 2024-07-19 MED ORDER — FAMOTIDINE 20 MG PO TABS
10.0000 mg | ORAL_TABLET | Freq: Every day | ORAL | Status: DC | PRN
Start: 2024-07-19 — End: 2024-07-20

## 2024-07-19 MED ORDER — DOCUSATE SODIUM 100 MG PO CAPS
100.0000 mg | ORAL_CAPSULE | Freq: Two times a day (BID) | ORAL | Status: DC
  Administered 2024-07-19: 100 mg via ORAL
  Filled 2024-07-19: qty 1

## 2024-07-19 MED ORDER — APIXABAN 5 MG PO TABS
10.0000 mg | ORAL_TABLET | Freq: Two times a day (BID) | ORAL | Status: DC
  Administered 2024-07-19: 10 mg via ORAL
  Filled 2024-07-19: qty 2

## 2024-07-19 MED ORDER — APIXABAN 5 MG PO TABS: 5.0000 mg | ORAL_TABLET | Freq: Two times a day (BID) | ORAL | Status: DC

## 2024-07-19 MED ORDER — CEPHALEXIN 500 MG PO CAPS: 500.0000 mg | ORAL_CAPSULE | Freq: Three times a day (TID) | ORAL | 0 refills | Status: AC

## 2024-07-19 MED ORDER — CEPHALEXIN 250 MG PO CAPS
500.0000 mg | ORAL_CAPSULE | Freq: Three times a day (TID) | ORAL | Status: DC
  Administered 2024-07-19: 500 mg via ORAL
  Filled 2024-07-19: qty 2

## 2024-07-19 MED ORDER — IOHEXOL 350 MG/ML SOLN
75.0000 mL | Freq: Once | INTRAVENOUS | Status: AC | PRN
  Administered 2024-07-19: 75 mL via INTRAVENOUS

## 2024-07-19 MED ORDER — ALLOPURINOL 100 MG PO TABS
300.0000 mg | ORAL_TABLET | Freq: Two times a day (BID) | ORAL | Status: DC
  Administered 2024-07-19: 300 mg via ORAL
  Filled 2024-07-19: qty 3

## 2024-07-19 MED ORDER — ISOSORBIDE MONONITRATE ER 30 MG PO TB24
30.0000 mg | ORAL_TABLET | Freq: Every day | ORAL | Status: DC
  Administered 2024-07-19: 30 mg via ORAL
  Filled 2024-07-19: qty 1

## 2024-07-19 MED ORDER — POLYETHYLENE GLYCOL 3350 17 G PO PACK
17.0000 g | PACK | Freq: Every day | ORAL | Status: DC | PRN
Start: 2024-07-19 — End: 2024-07-20

## 2024-07-19 MED ORDER — DULOXETINE HCL 60 MG PO CPEP
60.0000 mg | ORAL_CAPSULE | Freq: Every day | ORAL | Status: DC
  Administered 2024-07-19: 60 mg via ORAL
  Filled 2024-07-19: qty 1

## 2024-07-19 MED ORDER — IRBESARTAN 75 MG PO TABS
37.5000 mg | ORAL_TABLET | Freq: Every day | ORAL | Status: DC
  Administered 2024-07-19: 37.5 mg via ORAL
  Filled 2024-07-19: qty 0.5

## 2024-07-19 MED ORDER — GABAPENTIN 300 MG PO CAPS
300.0000 mg | ORAL_CAPSULE | Freq: Every day | ORAL | Status: DC
  Administered 2024-07-19: 300 mg via ORAL
  Filled 2024-07-19: qty 1

## 2024-07-19 MED ORDER — ASPIRIN 81 MG PO TBEC
81.0000 mg | DELAYED_RELEASE_TABLET | Freq: Two times a day (BID) | ORAL | Status: DC
  Administered 2024-07-19: 81 mg via ORAL
  Filled 2024-07-19: qty 1

## 2024-07-19 MED ORDER — CEFTRIAXONE SODIUM 1 G IJ SOLR
1.0000 g | Freq: Once | INTRAMUSCULAR | Status: AC
  Administered 2024-07-19: 1 g via INTRAVENOUS
  Filled 2024-07-19: qty 10

## 2024-07-19 MED ORDER — ACETAMINOPHEN 500 MG PO TABS
1000.0000 mg | ORAL_TABLET | Freq: Once | ORAL | Status: AC
  Administered 2024-07-19: 1000 mg via ORAL
  Filled 2024-07-19: qty 2

## 2024-07-19 MED ORDER — APIXABAN (ELIQUIS) VTE STARTER PACK (10MG AND 5MG): ORAL_TABLET | ORAL | 0 refills | Status: DC

## 2024-07-19 MED ORDER — PANTOPRAZOLE SODIUM 40 MG PO TBEC
40.0000 mg | DELAYED_RELEASE_TABLET | Freq: Every day | ORAL | Status: DC
  Administered 2024-07-19: 40 mg via ORAL
  Filled 2024-07-19: qty 1

## 2024-07-19 MED ORDER — ROSUVASTATIN CALCIUM 5 MG PO TABS
40.0000 mg | ORAL_TABLET | Freq: Every day | ORAL | Status: DC
  Administered 2024-07-19: 40 mg via ORAL
  Filled 2024-07-19: qty 8

## 2024-07-19 MED ORDER — FERROUS SULFATE 325 (65 FE) MG PO TABS: 325.0000 mg | ORAL_TABLET | Freq: Every day | ORAL | Status: DC

## 2024-07-19 NOTE — Discharge Instructions (Addendum)
 Home health face-to-face orders placed. Use Tylenol  every 4 hours as needed for pain.  Rachel Black  Thank you for allowing us  to take care of you today.  You came to the Emergency Department today because you have had a hard time taking care of yourself at home.  You have also had abdominal pain.  Here in the emergency department we got a CT which showed that you have a extensive blood clot in your left leg.  The vascular surgeon saw you and discussed potentially having a procedure to have this blood clot removed, however given you are not having any symptoms you do not want to do this at this time.  We are starting you on a blood thinner called Eliquis .  You will take 10 mg twice a day until 07/25/2024.  Beginning on 07/26/2024 you take 5 mg twice a day, and continue on this until the vascular surgery clinic tells you otherwise.  Will follow-up in the vascular surgery DVT clinic.  Additionally, you were found to have a bladder infection while in the emergency department, we are starting you on an antibiotic out Keflex  that you will take 3 times a day for 5 days.   To-Do: 1. Please follow-up with your primary doctor within 1 - 2 weeks / as soon as possible.   Please return to the Emergency Department or call 911 if you experience have worsening of your symptoms, or do not get better, chest pain, shortness of breath, severe or significantly worsening pain, high fever, severe confusion, pass out or have any reason to think that you need emergency medical care.   We hope you feel better soon.   Mitzie Later, MD Department of Emergency Medicine [facility]

## 2024-07-19 NOTE — ED Provider Notes (Signed)
  [place] EMERGENCY DEPARTMENT AT [facility] Provider Assume Care Note I assumed care of Rachel Black on 07/19/2024 at 3:30 PM from Dr. Zavitz.   Briefly, Rachel Black is a 73 y.o. female who: PMHx: hypertension, hyperlipidemia, CVA, DM 2, gout, chronic abdominal pain, IBS, GERD  P/w chronic abdominal pain, inability to complete ADLs at home CT of the abdomen pelvis with incidentally noted blood clot, now getting DVT ultrasound  Plan at the time of handoff: Follow-up DVT ultrasound, if DVT is extensive, consider vascular surgery consult   Please refer to the original provider's note for additional information regarding the care of Rachel Black.  Reassessment: I personally reassessed the patient: Patient without any acute complaints or additional questions.  Vital Signs:  ED Triage Vitals  Encounter Vitals Group     BP 07/19/24 0950 (!) 143/85     Girls Systolic BP Percentile --      Girls Diastolic BP Percentile --      Boys Systolic BP Percentile --      Boys Diastolic BP Percentile --      Pulse Rate 07/19/24 0955 71     Resp 07/19/24 0950 16     Temp 07/19/24 0950 98.9 F (37.2 C)     Temp Source 07/19/24 1355 Oral     SpO2 07/19/24 0955 98 %     Weight --      Height --      Head Circumference --      Peak Flow --      Pain Score 07/19/24 0956 6     Pain Loc --      Pain Education --      Exclude from Growth Chart --      Hemodynamics:  The patient is hemodynamically stable. Mental Status:  The patient is alert  Additional MDM: DVT resulted and patient does have extensive DVT in her left lower extremity from the iliac to the popliteal vein.  Consulted vascular surgery, spoke with Dr. Ethyl, who evaluated patient and offered procedural intervention, however patient declined.  Additionally, social work evaluated the patient, however unfortunately patient's insurance does not have any more SNF days available, therefore social work will set up  home health.  Patient will be discharged on course of Keflex  for UTI, as well as Eliquis  with referral to DVT clinic for her DVT.  Patient and husband at bedside expressed understanding.  Disposition: DISCHARGE: I believe that the patient is safe for discharge home with outpatient follow-up. Patient was informed of all pertinent physical exam, laboratory, and imaging findings. Patient's suspected etiology of their symptom presentation was discussed with the patient and all questions were answered. We discussed following up with PCP, vascular surgery, home health. I provided thorough ED return precautions. The patient feels safe and comfortable with this plan.   FREDRIK CANDIE Later, MD Emergency Medicine    Later Jerilynn RAMAN, MD 07/19/24 [phone]

## 2024-07-19 NOTE — ED Notes (Signed)
 [facility] contacted and pt informed she will be discharged. EDp working on AVS

## 2024-07-19 NOTE — Progress Notes (Signed)
 CSW reached out to the ED provider who stated patient has not been medically cleared for discharge. CSW was informed that Vascular has been consulted.

## 2024-07-19 NOTE — Consult Note (Signed)
 [facility] [place]  Patient name: Rachel Black MRN: [id number] DOB: [birth year] Sex: female   REQUESTING PROVIDER:    ER   REASON FOR CONSULT:    DVT  HISTORY OF PRESENT ILLNESS:   Rachel Black is a 73 y.o. female, who presented to the emergency department with chronic abdominal pain.  She underwent a CT scan that showed an incidentally found DVT in her iliac vein which led to a duplex showing left leg DVT.  The patient reports a fall suffering an ankle fracture on July 3.  She has been in a SNF.  She denies any significant activity and has been very sedentary.  She does not complain of any new left leg pain or swelling.  She does have chronic bilateral edema.  She does not have any wounds on her feet.  She does not wear compression stockings.  The patient has a history of hypertension.  She takes a statin for hypercholesterolemia.  She is a type II diabetic with history of stroke.  She does have IBS.  PAST MEDICAL HISTORY    Past Medical History:  Diagnosis Date   Arthritis    PAIN AND OA BOTH KNEES AND SHOULDERS AND ELBOWS AND WRIST   Blood transfusion without reported diagnosis 2018   after cholecystectomy   Broken foot, right, closed, initial encounter 09/15/2021   no surgery needed, fell at home and went to ED   Diabetes mellitus    ORAL MEDICATION   Diverticulitis    GERD (gastroesophageal reflux disease)    Gout    NO RECENT FLARE UPS   H/O: rheumatic fever    AS A CHILD - NO KNOWN HEART MURMUR OR HEART PROBLEMS   Heart attack (HCC)    Hyperlipidemia    Hypertension    PFO (patent foramen ovale)    Stroke (HCC) 03/2017     FAMILY HISTORY   Family History  Problem Relation Age of Onset   Diabetes Mother    Hypertension Mother        entire family   Breast cancer Mother        diagnosed in her 64's   Stroke Father        CVA   Hyperlipidemia Father        entire family   Diabetes Father     Heart disease Father        No details.  Not at an early age.     Crohn's disease Son    Irritable bowel syndrome Son    Colon cancer Neg Hx    Colon polyps Neg Hx    Esophageal cancer Neg Hx    Stomach cancer Neg Hx    Rectal cancer Neg Hx     SOCIAL HISTORY:   Social History   Socioeconomic History   Marital status: Married    Spouse name: Christopher   Number of children: 2   Years of education: Not on file   Highest education level: Not on file  Occupational History   Occupation: housewife    Employer: UNEMPLOYED  Tobacco Use   Smoking status: Never    Passive exposure: Never   Smokeless tobacco: Never  Vaping Use   Vaping status: Never Used  Substance and Sexual Activity   Alcohol use: Not Currently    Comment: rarely in the past   Drug use: Never   Sexual activity: Yes    Partners: Male  Other Topics Concern  Not on file  Social History Narrative   No exercise secondary to knee pain   Social Drivers of Health   Financial Resource Strain: Not on file  Food Insecurity: No Food Insecurity (03/19/2024)   Hunger Vital Sign    Worried About Running Out of Food in the Last Year: Never true    Ran Out of Food in the Last Year: Never true  Transportation Needs: No Transportation Needs (03/19/2024)   PRAPARE - Administrator, Civil Service (Medical): No    Lack of Transportation (Non-Medical): No  Physical Activity: Not on file  Stress: Not on file  Social Connections: Moderately Integrated (03/19/2024)   Social Connection and Isolation Panel    Frequency of Communication with Friends and Family: Twice a week    Frequency of Social Gatherings with Friends and Family: Once a week    Attends Religious Services: 1 to 4 times per year    Active Member of [employer] or Organizations: No    Attends Banker Meetings: Never    Marital Status: Married  Catering Manager Violence: Not At Risk (03/19/2024)   Humiliation, Afraid, Rape, and Kick questionnaire     Fear of Current or Ex-Partner: No    Emotionally Abused: No    Physically Abused: No    Sexually Abused: No    ALLERGIES:    Allergies  Allergen Reactions   Other Other (See Comments)    Some BANDAIDS cause skin irritation    Penicillins Itching     Tolerated Zosyn  couse 10/04/22    CURRENT MEDICATIONS:    No current facility-administered medications for this encounter.   Current Outpatient Medications  Medication Sig Dispense Refill   ACCU-CHEK AVIVA PLUS test strip USE AS INSTRUCTED ONCE A DAY. DX CODE E11.9 100 strip 1   Accu-Chek Softclix Lancets lancets Use as instructed once a day.  DX Code: E11.9 100 each 1   acetaminophen  (TYLENOL ) 500 MG tablet Take 1,000 mg by mouth every 6 (six) hours as needed for mild pain or moderate pain.     allopurinol  (ZYLOPRIM ) 300 MG tablet TAKE 1 TABLET BY MOUTH TWICE A DAY 180 tablet 1   aspirin  EC (ASPIRIN  LOW DOSE) 81 MG tablet Take 1 tablet by mouth twice a day for 42 days. 84 tablet 0   Calcium  Carb-Cholecalciferol (CALCIUM  + VITAMIN D3 PO) Take 2 tablets by mouth daily.     docusate sodium  (COLACE) 100 MG capsule Take 1 capsule by mouth twice a day for 28 days. 56 capsule 0   DULoxetine  (CYMBALTA ) 60 MG capsule Take 1 capsule (60 mg total) by mouth daily. 90 capsule 3   Emollient (UDDERLY SMOOTH) CREA Apply 1 application topically See admin instructions. Apply topically to skin folds as needed for rash/irritation     famotidine  (PEPCID ) 10 MG tablet Take 10 mg by mouth daily as needed for heartburn or indigestion.     fenofibrate  160 MG tablet Take 1 tablet (160 mg total) by mouth daily. 90 tablet 3   ferrous sulfate  325 (65 FE) MG tablet TAKE 1 TABLET BY MOUTH TWICE A DAY 180 tablet 3   gabapentin  (NEURONTIN ) 300 MG capsule TAKE 1 CAPSULE BY MOUTH EVERYDAY AT BEDTIME 30 capsule 1   isosorbide  mononitrate (IMDUR ) 30 MG 24 hr tablet TAKE 1 TABLET BY MOUTH EVERY DAY 90 tablet 3   olmesartan  (BENICAR ) 5 MG tablet Take 1 tablet (5 mg  total) by mouth daily. 90 tablet 3   ondansetron  (  ZOFRAN -ODT) 4 MG disintegrating tablet Place 1 tablet in mouth and allow to dissolve every 8 hours as needed for 5 days, for nausea. 15 tablet 0   pantoprazole  (PROTONIX ) 40 MG tablet TAKE 1 TABLET BY MOUTH EVERY DAY 90 tablet 1   polyethylene glycol (MIRALAX  / GLYCOLAX ) 17 g packet Take 17 g by mouth daily as needed for moderate constipation.     rosuvastatin  (CRESTOR ) 40 MG tablet TAKE 1 TABLET BY MOUTH EVERY DAY 90 tablet 2    REVIEW OF SYSTEMS:   [X]  denotes positive finding, [ ]  denotes negative finding Cardiac  Comments:  Chest pain or chest pressure:    Shortness of breath upon exertion:    Short of breath when lying flat:    Irregular heart rhythm:        Vascular    Pain in calf, thigh, or hip brought on by ambulation:    Pain in feet at night that wakes you up from your sleep:     Blood clot in your veins:    Leg swelling:  x       Pulmonary    Oxygen at home:    Productive cough:     Wheezing:         Neurologic    Sudden weakness in arms or legs:     Sudden numbness in arms or legs:     Sudden onset of difficulty speaking or slurred speech:    Temporary loss of vision in one eye:     Problems with dizziness:         Gastrointestinal    Blood in stool:      Vomited blood:         Genitourinary    Burning when urinating:     Blood in urine:        Psychiatric    Major depression:         Hematologic    Bleeding problems:    Problems with blood clotting too easily:        Skin    Rashes or ulcers:        Constitutional    Fever or chills:     PHYSICAL EXAM:   Vitals:   07/19/24 1430 07/19/24 1500 07/19/24 1700 07/19/24 1730  BP: (!) 145/62 (!) 144/78 136/61 (!) 143/89  Pulse:   70 70  Resp:   18 19  Temp:      TempSrc:      SpO2:   96% 97%    GENERAL: The patient is a well-nourished female, in no acute distress. The vital signs are documented above. CARDIAC: There is a regular rate and  rhythm.  VASCULAR: Bilateral leg edema left equals right.  No left calf or left leg tenderness.  No signs of lower extremity ischemia PULMONARY: Nonlabored respirations ABDOMEN: Soft and non-tender  MUSCULOSKELETAL: There are no major deformities or cyanosis. NEUROLOGIC: No focal weakness or paresthesias are detected. SKIN: There are no ulcers or rashes noted. PSYCHIATRIC: The patient has a normal affect.  STUDIES:   I have reviewed the following:  Venous duplex: BILATERAL:  -No evidence of popliteal cyst, bilaterally.  RIGHT:  - There is no evidence of deep vein thrombosis in the lower extremity.  LEFT:  - Findings consistent with acute deep vein thrombosis involving the left  common femoral vein, SF junction, left femoral vein, left popliteal vein,  left proximal profunda vein, and external iliac vein.  - Findings consistent with acute superficial  vein thrombosis involving the  left great saphenous vein.     CT scan: 1. Low-attenuation within the left external iliac vein, extending to the femoral vein bifurcation, worrisome for deep venous thrombosis. Recommend Doppler ultrasound of the left lower extremity. Critical Value/emergent results were called by telephone at the time of interpretation on 07/19/2024 at 2:08 pm to provider [facility] , who verbally acknowledged these results. 2. Liver is mildly enlarged and appears cirrhotic.   ASSESSMENT and PLAN   Left iliofemoral and distal DVT: This is likely provoked given her ankle fracture and sedentary lifestyle since that time.  She does not endorse any new complaints in her left leg.  She has chronic bilateral swelling.  I discussed the importance of anticoagulation to prevent propagation of the clot and PE.  We also discussed the possibility of mechanical thrombectomy.  She is not interested in that at this time.  I suspect that she does not have any significant left leg swelling because she has been essentially bedbound.  I  discussed that we have a small window of time where we can proceed with mechanical thrombectomy.  If she develops worsening left leg swelling, this could be considered within the next week or so.  At this time she does not wish to proceed with any additional procedures.  I have recommended starting a DOAC.  She can follow-up in the DVT clinic.   Malvina Serene CLORE, MD, FACS [facility] [phone] Pager [phone]

## 2024-07-19 NOTE — Telephone Encounter (Signed)
 FYI Only or Action Required?: Action required by provider: update on patient condition.  Patient was last seen in primary care on 05/12/2023 by Wallace Joesph LABOR, PA.  Called Nurse Triage reporting Fatigue.  Symptoms began several days ago.  Interventions attempted: Rest, hydration, or home remedies.  Symptoms are: gradually worsening.Pt. Recently home from rehab. Bedridden per husband. Has abdominal pain, is not eating. Rashto groin and legs.  Triage Disposition: Call EMS 911 Now  Patient/caregiver understands and will follow disposition?: Yes    Copied from CRM #[id number]. Topic: Clinical - Red Word Triage >> Jul 19, 2024  8:09 AM Robinson H wrote: [facility] that prompted transfer to Nurse Triage: Husband states patient is bed ridden broke ankle back in July, not eating, stomach pain, dry heaves, rash under belly and on groin. Patient states she's hurting. Reason for Disposition  [1] SEVERE weakness (e.g., unable to walk or barely able to walk, requires support) AND [2] new-onset or getting worse  Answer Assessment - Initial Assessment Questions 1. DESCRIPTION: Describe how you are feeling.     Weak, bedridden 2. SEVERITY: How bad is it?  Can you stand and walk?     no 3. ONSET: When did these symptoms begin? (e.g., hours, days, weeks, months)     July 4. CAUSE: What do you think is causing the weakness or fatigue? (e.g., not drinking enough fluids, medical problem, trouble sleeping)     Not helping 5. NEW MEDICINES:  Have you started on any new medicines recently? (e.g., opioid pain medicines, benzodiazepines, muscle relaxants, antidepressants, antihistamines, neuroleptics, beta blockers)     no 6. OTHER SYMPTOMS: Do you have any other symptoms? (e.g., chest pain, fever, cough, SOB, vomiting, diarrhea, bleeding, other areas of pain)     Abdominal pain 7. PREGNANCY: Is there any chance you are pregnant? When was your last menstrual period?     no  Protocols  used: Weakness (Generalized) and Fatigue-A-AH

## 2024-07-19 NOTE — ED Notes (Signed)
 Patient transported to Ultrasound

## 2024-07-19 NOTE — Progress Notes (Signed)
 Physical Therapy Quick Note  PT has completed initial evaluation.    Overall, patient at max assistance level.   PT Follow up recommended: Long-term institutional care without follow-up therapy Equipment recommended:  Hospital Bed and Hoyer Lift Complete evaluation note to follow.     Bernardino JINNY Ruth, PT, DPT Acute Rehabilitation Office [phone]

## 2024-07-19 NOTE — ED Triage Notes (Addendum)
 PT BIB [facility] From home fro abd pain, L. Ankle pain x  days with chronic hx.  No distention.  EMS endorses rash on L. Groin area.  Pain is 6/10.  PO status has been less but normal toileting. Husband is coming.  They are requesting to discuss SNF placement with SW.   Pt endorses frequent loose stools and freq urination.   108/palp HR 76 RR 16 96% RA 97.7, CBG 109

## 2024-07-19 NOTE — NC FL2 (Signed)
 [place]  MEDICAID FL2 LEVEL OF CARE FORM     IDENTIFICATION  Patient Name: Rachel Black Birthdate: 1951/07/31 Sex: female Admission Date (Current Location): 07/19/2024  [facility] and [place] Number:  Producer, Television/film/video and Address:  [facility], [address], [place], [place] [address]      Provider Number: [id number]  Attending Physician Name and Address:  Tonia Chew, MD  Relative Name and Phone Number:  Talita, Recht (Spouse)  [phone] [facility])    Current Level of Care: Hospital Recommended Level of Care: Skilled Nursing Facility Prior Approval Number:    Date Approved/Denied:   PASRR Number: [id number]  Discharge Plan: SNF    Current Diagnoses: Patient Active Problem List   Diagnosis Date Noted   Left trimalleolar fracture 03/19/2024   Fall at home, initial encounter 03/19/2024   Transient hypotension 03/19/2024   Macrocytic anemia 03/19/2024   Anxiety 01/31/2023   Acute pulmonary embolism (HCC) 01/14/2023   H/O: CVA (cerebrovascular accident) 01/14/2023   CAD S/P percutaneous coronary angioplasty 01/14/2023   Atopic dermatitis 11/01/2022   Inflammatory polyarthropathy (HCC) 11/01/2022   Palpitations 10/25/2022   Left hip pain 10/04/2022   Irritable bowel syndrome with constipation 03/16/2022   Influenza B 09/16/2021   Cryptogenic stroke (HCC) 01/13/2021   GERD without esophagitis 11/26/2020   Iron deficiency 11/26/2020   Generalized abdominal pain 11/26/2020   Family history of breast cancer in mother 11/27/2019   Cataract of both eyes 11/27/2019   Coronary artery disease involving native coronary artery of native heart without angina pectoris 02/18/2018   NSTEMI (non-ST elevated myocardial infarction) (HCC)    History of rheumatic fever 06/30/2017   PFO (patent foramen ovale)    Cerebrovascular accident (CVA) due to embolism of right middle cerebral artery (HCC) 03/14/2017   Elevated LFTs    Choledocholithiasis  with obstruction 12/07/2016   AKI (acute kidney injury) 12/07/2016   Osteoarthritis of right knee 08/23/2015   Status post total left knee replacement 08/23/2015   Arthritis of knee, right 04/20/2014   Status post total right knee replacement 04/20/2014   Flushing reaction 03/10/2012   Type 2 diabetes mellitus with other specified complication (HCC) 02/17/2007   GOUT 02/17/2007   Severe obesity (BMI >= 40) (HCC) 02/17/2007   Mixed hyperlipidemia 02/17/2007   Hypertension associated with diabetes (HCC) 02/17/2007    Orientation RESPIRATION BLADDER Height & Weight     Self, Time, Situation, Place  Normal Continent Weight:   Height:     BEHAVIORAL SYMPTOMS/MOOD NEUROLOGICAL BOWEL NUTRITION STATUS      Continent Diet (see dc summary)  AMBULATORY STATUS COMMUNICATION OF NEEDS Skin   Extensive Assist Verbally Normal                       Personal Care Assistance Level of Assistance  Bathing, Feeding, Dressing Bathing Assistance: Limited assistance Feeding assistance: Limited assistance Dressing Assistance: Limited assistance     Functional Limitations Info  Speech, Hearing, Sight Sight Info: Adequate Hearing Info: Adequate Speech Info: Adequate    SPECIAL CARE FACTORS FREQUENCY  PT (By licensed PT), OT (By licensed OT)     PT Frequency: 5x/wk OT Frequency: 5x/wk            Contractures Contractures Info: Not present    Additional Factors Info  Code Status, Allergies Code Status Info: Full Code Allergies Info: Penicillins           Current Medications (07/19/2024):  This is  the current hospital active medication list Current Facility-Administered Medications  Medication Dose Route Frequency Provider Last Rate Last Admin   cefTRIAXone  (ROCEPHIN ) 1 g in sodium chloride  0.9 % 100 mL IVPB  1 g Intravenous Once Zavitz, Joshua, MD       Current Outpatient Medications  Medication Sig Dispense Refill   ACCU-CHEK AVIVA PLUS test strip USE AS INSTRUCTED ONCE A  DAY. DX CODE E11.9 100 strip 1   Accu-Chek Softclix Lancets lancets Use as instructed once a day.  DX Code: E11.9 100 each 1   acetaminophen  (TYLENOL ) 500 MG tablet Take 1,000 mg by mouth every 6 (six) hours as needed for mild pain or moderate pain.     allopurinol  (ZYLOPRIM ) 300 MG tablet TAKE 1 TABLET BY MOUTH TWICE A DAY 180 tablet 1   aspirin  EC (ASPIRIN  LOW DOSE) 81 MG tablet Take 1 tablet by mouth twice a day for 42 days. 84 tablet 0   Calcium  Carb-Cholecalciferol (CALCIUM  + VITAMIN D3 PO) Take 2 tablets by mouth daily.     docusate sodium  (COLACE) 100 MG capsule Take 1 capsule by mouth twice a day for 28 days. 56 capsule 0   DULoxetine  (CYMBALTA ) 60 MG capsule Take 1 capsule (60 mg total) by mouth daily. 90 capsule 3   Emollient (UDDERLY SMOOTH) CREA Apply 1 application topically See admin instructions. Apply topically to skin folds as needed for rash/irritation     famotidine  (PEPCID ) 10 MG tablet Take 10 mg by mouth daily as needed for heartburn or indigestion.     fenofibrate  160 MG tablet Take 1 tablet (160 mg total) by mouth daily. 90 tablet 3   ferrous sulfate  325 (65 FE) MG tablet TAKE 1 TABLET BY MOUTH TWICE A DAY 180 tablet 3   gabapentin  (NEURONTIN ) 300 MG capsule TAKE 1 CAPSULE BY MOUTH EVERYDAY AT BEDTIME 30 capsule 1   isosorbide  mononitrate (IMDUR ) 30 MG 24 hr tablet TAKE 1 TABLET BY MOUTH EVERY DAY 90 tablet 3   olmesartan  (BENICAR ) 5 MG tablet Take 1 tablet (5 mg total) by mouth daily. 90 tablet 3   ondansetron  (ZOFRAN -ODT) 4 MG disintegrating tablet Place 1 tablet in mouth and allow to dissolve every 8 hours as needed for 5 days, for nausea. 15 tablet 0   pantoprazole  (PROTONIX ) 40 MG tablet TAKE 1 TABLET BY MOUTH EVERY DAY 90 tablet 1   polyethylene glycol (MIRALAX  / GLYCOLAX ) 17 g packet Take 17 g by mouth daily as needed for moderate constipation.     rosuvastatin  (CRESTOR ) 40 MG tablet TAKE 1 TABLET BY MOUTH EVERY DAY 90 tablet 2     Discharge Medications: Please  see discharge summary for a list of discharge medications.  Relevant Imaging Results:  Relevant Lab Results:   Additional Information SSN [id number]  Sheri ONEIDA Sharps, LCSW

## 2024-07-19 NOTE — Progress Notes (Signed)
 BLE venous duplex has been completed.  Preliminary results given to Dr. Rogelia.    Results can be found under chart review under CV PROC. 07/19/2024 4:17 PM Lee-Ann Gal RVT, RDMS

## 2024-07-19 NOTE — ED Provider Notes (Signed)
 [place] [facility] AT [facility] Provider Note   CSN: [id number] Arrival date & time: 07/19/24  9049     Patient presents with: Abdominal Pain and Ankle Pain   Rachel Black is a 73 y.o. female.   Patient presents with intermittent abdominal discomfort over the past few weeks, rash in the groin, intermittent ankle pain for the past few weeks with history of ankle surgery on the left and general deconditioning.  Patient recently was placed in rehab and recently she has not been able to do normal functions of daily living.  Patient's husband has to help her go to the bathroom.  Patient denies fevers or chills.  Patient has had frequent urination.  No vomiting.  No history of diverticulitis.  No blood in the stools.  The history is provided by the patient.  Abdominal Pain Associated symptoms: fatigue and nausea   Associated symptoms: no chest pain, no chills, no dysuria, no fever, no shortness of breath and no vomiting   Ankle Pain Associated symptoms: fatigue   Associated symptoms: no back pain, no fever and no neck pain        Prior to Admission medications   Medication Sig Start Date End Date Taking? Authorizing Provider  ACCU-CHEK AVIVA PLUS test strip USE AS INSTRUCTED ONCE A DAY. DX CODE E11.9 07/20/22   Abonza, Maritza, PA-C  Accu-Chek Softclix Lancets lancets Use as instructed once a day.  DX Code: E11.9 03/24/22   Abonza, Maritza, PA-C  acetaminophen  (TYLENOL ) 500 MG tablet Take 1,000 mg by mouth every 6 (six) hours as needed for mild pain or moderate pain.    [provider]  allopurinol  (ZYLOPRIM ) 300 MG tablet TAKE 1 TABLET BY MOUTH TWICE A DAY 05/22/24   Chandra Toribio [name], MD  aspirin  EC (ASPIRIN  LOW DOSE) 81 MG tablet Take 1 tablet by mouth twice a day for 42 days. 03/21/24     Calcium  Carb-Cholecalciferol (CALCIUM  + VITAMIN D3 PO) Take 2 tablets by mouth daily.    [provider]  docusate sodium  (COLACE) 100 MG capsule Take 1 capsule by  mouth twice a day for 28 days. 03/21/24     DULoxetine  (CYMBALTA ) 60 MG capsule Take 1 capsule (60 mg total) by mouth daily. 05/12/23   Wallace Search A, PA  Emollient (UDDERLY SMOOTH) CREA Apply 1 application topically See admin instructions. Apply topically to skin folds as needed for rash/irritation    [provider]  famotidine  (PEPCID ) 10 MG tablet Take 10 mg by mouth daily as needed for heartburn or indigestion.    [provider]  fenofibrate  160 MG tablet Take 1 tablet (160 mg total) by mouth daily. 05/12/23   Wallace Search LABOR, PA  ferrous sulfate  325 (65 FE) MG tablet TAKE 1 TABLET BY MOUTH TWICE A DAY 08/16/23   Abran Norleen SAILOR, MD  gabapentin  (NEURONTIN ) 300 MG capsule TAKE 1 CAPSULE BY MOUTH EVERYDAY AT BEDTIME 07/19/24   Gayle Numbers F, PA-C  isosorbide  mononitrate (IMDUR ) 30 MG 24 hr tablet TAKE 1 TABLET BY MOUTH EVERY DAY 08/31/23   Daneen Damien BROCKS, NP  olmesartan  (BENICAR ) 5 MG tablet Take 1 tablet (5 mg total) by mouth daily. 05/12/23   Wallace Search LABOR, PA  ondansetron  (ZOFRAN -ODT) 4 MG disintegrating tablet Place 1 tablet in mouth and allow to dissolve every 8 hours as needed for 5 days, for nausea. 03/21/24     pantoprazole  (PROTONIX ) 40 MG tablet TAKE 1 TABLET BY MOUTH EVERY DAY  02/18/24   Abran Norleen SAILOR, MD  polyethylene glycol (MIRALAX  / GLYCOLAX ) 17 g packet Take 17 g by mouth daily as needed for moderate constipation.    [provider]  rosuvastatin  (CRESTOR ) 40 MG tablet TAKE 1 TABLET BY MOUTH EVERY DAY 07/28/23   Lavona Agent, MD    Allergies: Other and Penicillins    Review of Systems  Constitutional:  Positive for fatigue. Negative for chills and fever.  HENT:  Negative for congestion.   Eyes:  Negative for visual disturbance.  Respiratory:  Negative for shortness of breath.   Cardiovascular:  Negative for chest pain.  Gastrointestinal:  Positive for abdominal pain and nausea. Negative for vomiting.  Genitourinary:  Negative for dysuria and  flank pain.  Musculoskeletal:  Positive for gait problem. Negative for back pain, neck pain and neck stiffness.  Skin:  Negative for rash.  Neurological:  Negative for light-headedness and headaches.    Updated Vital Signs BP 139/66   Pulse 78   Temp 98.3 F (36.8 C) (Oral)   Resp 15   SpO2 100%   Physical Exam Vitals and nursing note reviewed.  Constitutional:      General: She is not in acute distress.    Appearance: She is well-developed.  HENT:     Head: Normocephalic and atraumatic.     Mouth/Throat:     Mouth: Mucous membranes are moist.  Eyes:     General:        Right eye: No discharge.        Left eye: No discharge.     Conjunctiva/sclera: Conjunctivae normal.  Neck:     Trachea: No tracheal deviation.  Cardiovascular:     Rate and Rhythm: Normal rate and regular rhythm.     Heart sounds: No murmur heard. Pulmonary:     Effort: Pulmonary effort is normal.     Breath sounds: Normal breath sounds.  Abdominal:     General: There is no distension.     Palpations: Abdomen is soft.     Tenderness: There is abdominal tenderness in the epigastric area and left lower quadrant. There is no guarding.  Musculoskeletal:     Cervical back: Normal range of motion and neck supple. No rigidity.  Skin:    General: Skin is warm.     Capillary Refill: Capillary refill takes less than 2 seconds.     Findings: No rash.     Comments: Patient has warm erythema with few satellite lesions and very moist skin underneath abdominal area and pelvic bilateral.  Neurological:     General: No focal deficit present.     Mental Status: She is alert.     Cranial Nerves: No cranial nerve deficit.     Comments: Patient can lift arms and legs against gravity, general weakness on exam.  Patient asked to place additional effort to hold each leg for longer than 5 seconds.  General deconditioning.  Psychiatric:        Mood and Affect: Mood normal.     (all labs ordered are listed, but only  abnormal results are displayed) Labs Reviewed  COMPREHENSIVE METABOLIC PANEL WITH GFR - Abnormal; Notable for the following components:      Result Value   Total Protein 5.1 (*)    Albumin  2.7 (*)    All other components within normal limits  CBC - Abnormal; Notable for the following components:   RBC 3.77 (*)    RDW 17.2 (*)    All  other components within normal limits  URINALYSIS, ROUTINE W REFLEX MICROSCOPIC - Abnormal; Notable for the following components:   APPearance HAZY (*)    Nitrite POSITIVE (*)    Leukocytes,Ua SMALL (*)    Bacteria, UA RARE (*)    All other components within normal limits  URINE CULTURE  LIPASE, BLOOD    EKG: None  Radiology: CT ABDOMEN PELVIS W CONTRAST Result Date: 07/19/2024 CLINICAL DATA:  Abdominal pain, decreased appetite. EXAM: CT ABDOMEN AND PELVIS WITH CONTRAST TECHNIQUE: Multidetector CT imaging of the abdomen and pelvis was performed using the standard protocol following bolus administration of intravenous contrast. RADIATION DOSE REDUCTION: This exam was performed according to the departmental dose-optimization program which includes automated exposure control, adjustment of the mA and/or kV according to patient size and/or use of iterative reconstruction technique. CONTRAST:  75mL OMNIPAQUE  IOHEXOL  350 MG/ML SOLN COMPARISON:  03/15/2024. FINDINGS: Lower chest: Mild dependent atelectasis bilaterally. Heart is enlarged. Trace pericardial fluid is likely physiologic. No pleural effusion. Distal esophagus is grossly unremarkable. Hepatobiliary: Liver is enlarged, 18.6 cm. Margin is somewhat irregular. Chronic pneumobilia. Cholecystectomy. No unexpected biliary ductal dilatation. Pancreas: Negative. Spleen: Negative. Adrenals/Urinary Tract: Adrenal glands are unremarkable. Small low-attenuation lesions in the kidneys. No specific follow-up necessary. Ureters are decompressed. Trace amount of excreted contrast in the bladder. Bladder is low in volume.  Stomach/Bowel: There may be a tiny hiatal hernia. Stomach, small bowel, appendix and colon are unremarkable. Vascular/Lymphatic: Long segment low-attenuation within the left external iliac vein and femoral vein bifurcation. Vascular structures are otherwise unremarkable. No pathologically enlarged lymph nodes. Reproductive: Hysterectomy.  No adnexal mass. Other: Mild nonspecific presacral edema. Mesenteries and peritoneum are otherwise unremarkable. Musculoskeletal: Osteopenia. Degenerative changes in the spine. Grade 1 anterolisthesis of L3 on L4 and L4 on L5. IMPRESSION: 1. Low-attenuation within the left external iliac vein, extending to the femoral vein bifurcation, worrisome for deep venous thrombosis. Recommend Doppler ultrasound of the left lower extremity. Critical Value/emergent results were called by telephone at the time of interpretation on 07/19/2024 at 2:08 pm to provider [facility] , who verbally acknowledged these results. 2. Liver is mildly enlarged and appears cirrhotic. Electronically Signed   By: Newell Eke M.D.   On: 07/19/2024 14:08   DG Ankle Left Port Result Date: 07/19/2024 CLINICAL DATA:  Left ankle pain chronically. EXAM: PORTABLE LEFT ANKLE - 2 VIEW COMPARISON:  07/15/2024 FINDINGS: There is diffuse decreased bone mineralization. Fixation hardware over the distal tibia and fibula intact and unchanged. No acute fracture or dislocation. Ankle mortise unchanged. Degenerative changes over the tibiotalar joint and midfoot. Inferior calcaneal spur. IMPRESSION: 1. No acute findings. 2. Degenerative changes of the ankle and midfoot. Electronically Signed   By: Toribio Agreste M.D.   On: 07/19/2024 12:36   DG Ankle Right Port Result Date: 07/19/2024 CLINICAL DATA:  Left ankle pain chronically. EXAM: PORTABLE RIGHT ANKLE - 2 VIEW COMPARISON:  None Available. FINDINGS: There is diffuse decreased bone mineralization. Degenerative changes over the ankle mortise and tibiotalar joint. No  acute fracture or dislocation. Mild degenerate change over the midfoot. Small inferior calcaneal spur. Soft tissues are unremarkable. IMPRESSION: 1. No acute findings. 2. Degenerative changes as described. Electronically Signed   By: Toribio Agreste M.D.   On: 07/19/2024 12:34     Procedures   Medications Ordered in the ED  acetaminophen  (TYLENOL ) tablet 1,000 mg (1,000 mg Oral Given 07/19/24 1217)  cefTRIAXone  (ROCEPHIN ) 1 g in sodium chloride  0.9 % 100 mL IVPB (1 g Intravenous  New Bag/Given 07/19/24 1323)  iohexol  (OMNIPAQUE ) 350 MG/ML injection 75 mL (75 mLs Intravenous Contrast Given 07/19/24 1330)                                    Medical Decision Making Amount and/or Complexity of Data Reviewed Labs: ordered. Radiology: ordered.  Risk OTC drugs. Prescription drug management.   Patient presents clinically deconditioned with acute on chronic signs and symptoms.  Patient does have urinary frequency which if UTI can make her feel more weak.  Patient interested in rehab/assisted living placement for further assistance.  Blood work independent reviewed no signs significant electrolyte abnormality, no anemia, kidney function within normal limits.  Urinalysis reviewed nitrate positive patient has mild urinary symptoms.  Plan for antibiotics.  Case and social worker consulted for assistance.  Patient stable on reassessment.  Radiology called as concern for iliac thrombus, recommended ultrasound lower extremity for further delineation.  Patient care be signed out to follow-up ultrasound results and with social worker/case management further admission or placement.     Final diagnoses:  Physical deconditioning  Acute cystitis without hematuria    ED Discharge Orders     None          Tonia Chew, MD 07/19/24 1419

## 2024-07-19 NOTE — Evaluation (Signed)
 Physical Therapy Evaluation Patient Details Name: Rachel Black MRN: [id number] DOB: [birth year] Today's Date: 07/19/2024  History of Present Illness  73 y.o. female presents to [facility] hospital on 07/19/2024 from home due to lack of sufficient caregiver support. Pt was recently discharged from SNF last week due to lack of payor source after short term rehab with L ankle fx (July 2025). PMH includes: arthritis, DM, gout, PFO, CVA, HTN, ERCP, TKA bil., CAD.  Clinical Impression  Pt presents to PT at or near recent baseline. Pt had been at [facility] since July for rehab after L ankle fx. Pt reports progressing to standing with assistance of staff members for short periods of time, but had not been able to ambulate for functional distances due to L ankle pain. Pt has been bed bound since discharge home a few days prior to this presentation due to lack of caregiver support. Pt appears to be at her new baseline after multiple months of inpatient therapies. PT recommends long term inpatient placement, or discharge home with increased caregiver support, a hoyer lift and hospital bed. The patient does not demonstrate the potential to make significant functional gains on a reasonable timeline with inpatient PT services.      If plan is discharge home, recommend the following: Two people to help with walking and/or transfers;A lot of help with bathing/dressing/bathroom;Assistance with cooking/housework;Assist for transportation;Help with stairs or ramp for entrance;Supervision due to cognitive status;Direct supervision/assist for medications management;Direct supervision/assist for financial management   Can travel by private vehicle   No    Equipment Recommendations Hospital bed;Hoyer lift  Recommendations for Other Services       Functional Status Assessment Patient has had a recent decline in their functional status and demonstrates the ability to make significant improvements in function in a reasonable and  predictable amount of time.     Precautions / Restrictions Precautions Precautions: Fall Recall of Precautions/Restrictions: Impaired Required Braces or Orthoses:  (has a CAM boot for LLE, appears this is for comfort at this point) Restrictions Weight Bearing Restrictions Per Provider Order: Yes LLE Weight Bearing Per Provider Order: Weight bearing as tolerated      Mobility  Bed Mobility Overal bed mobility: Needs Assistance Bed Mobility: Supine to Sit, Sit to Supine     Supine to sit: Min assist, HOB elevated Sit to supine: Min assist        Transfers Overall transfer level: Needs assistance Equipment used: 1 person hand held assist Transfers: Sit to/from Stand Sit to Stand: Max assist, From elevated surface           General transfer comment: pt stands for a brief period, poor tolerance for WB through LLE. Pt declines a 2nd attempt at standing with RW due to L foot pain    Ambulation/Gait                  Stairs            Wheelchair Mobility     Tilt Bed    Modified Rankin (Stroke Patients Only)       Balance Overall balance assessment: Needs assistance Sitting-balance support: No upper extremity supported, Feet unsupported Sitting balance-Leahy Scale: Fair     Standing balance support: Bilateral upper extremity supported, Reliant on assistive device for balance Standing balance-Leahy Scale: Poor Standing balance comment: modA  Pertinent Vitals/Pain Pain Assessment Pain Assessment: Faces Faces Pain Scale: Hurts even more Pain Location: L foot/ankle Pain Descriptors / Indicators: Grimacing Pain Intervention(s): Monitored during session    Home Living Family/patient expects to be discharged to:: Private residence Living Arrangements: Spouse/significant other Available Help at Discharge: Family;Available PRN/intermittently (spouse works during the day, no other caregivers available) Type of  Home: House Home Access: Stairs to enter Entrance Stairs-Rails: None Entrance Stairs-Number of Steps: 2   Home Layout: One level Home Equipment: Agricultural Consultant (2 wheels);Shower seat;Wheelchair - manual      Prior Function Prior Level of Function : Needs assist             Mobility Comments: pt reports she had been working on standing with assistance of staff at rehab recently but had been unable to tolerate ambulation due to L foot pain. Pt has not been out of bed since at home. ADLs Comments: pt has been requiring assistance with bathing, dressing, toilieting, IADLs     Extremity/Trunk Assessment   Upper Extremity Assessment Upper Extremity Assessment: Generalized weakness    Lower Extremity Assessment Lower Extremity Assessment: Generalized weakness (pain with ROM at L ankle)    Cervical / Trunk Assessment Cervical / Trunk Assessment: Kyphotic  Communication   Communication Communication: No apparent difficulties    Cognition Arousal: Alert Behavior During Therapy: WFL for tasks assessed/performed   PT - Cognitive impairments: Orientation, Memory, Problem solving   Orientation impairments: Time                     Following commands: Intact       Cueing Cueing Techniques: Verbal cues     General Comments General comments (skin integrity, edema, etc.): VSS on RA    Exercises     Assessment/Plan    PT Assessment Patient needs continued PT services  PT Problem List Decreased balance;Decreased activity tolerance;Decreased strength;Decreased mobility;Decreased knowledge of use of DME;Decreased safety awareness;Decreased knowledge of precautions;Pain       PT Treatment Interventions DME instruction;Functional mobility training;Therapeutic activities;Therapeutic exercise;Neuromuscular re-education;Balance training;Cognitive remediation;Patient/family education;Wheelchair mobility training;Gait training    PT Goals (Current goals can be found in  the Care Plan section)  Acute Rehab PT Goals Patient Stated Goal: to reduce caregiver burden PT Goal Formulation: With patient Time For Goal Achievement: 08/02/24 Potential to Achieve Goals: Fair    Frequency Min 1X/week     Co-evaluation               AM-PAC PT 6 Clicks Mobility  Outcome Measure Help needed turning from your back to your side while in a flat bed without using bedrails?: A Little Help needed moving from lying on your back to sitting on the side of a flat bed without using bedrails?: A Lot Help needed moving to and from a bed to a chair (including a wheelchair)?: A Lot Help needed standing up from a chair using your arms (e.g., wheelchair or bedside chair)?: A Lot Help needed to walk in hospital room?: Total Help needed climbing 3-5 steps with a railing? : Total 6 Click Score: 11    End of Session Equipment Utilized During Treatment: Gait belt Activity Tolerance: Patient limited by pain Patient left: in bed;with call bell/phone within reach Nurse Communication: Mobility status;Need for lift equipment PT Visit Diagnosis: Other abnormalities of gait and mobility (R26.89);Muscle weakness (generalized) (M62.81);History of falling (Z91.81);Pain Pain - Right/Left: Left Pain - part of body: Ankle and joints of foot    Time:  8741-8669 PT Time Calculation (min) (ACUTE ONLY): 32 min   Charges:   PT Evaluation $PT Eval Low Complexity: 1 Low   PT General Charges $$ ACUTE PT VISIT: 1 Visit         Bernardino JINNY Ruth, PT, DPT Acute Rehabilitation Office [phone]   Bernardino JINNY Ruth 07/19/2024, 3:15 PM

## 2024-07-19 NOTE — Progress Notes (Addendum)
 CSW met with pt at bedside. Pt expressed interest in long-term care placement, stating her husband is having difficulty providing care at home. CSW educated that long-term care placement is not typically arranged from the hospital and that PT will evaluate to determine if STR is appropriate. CSW discussed that pt may have exhausted SNF benefit days and that ins shara is required for approval. CSW educated pt that if not approved, pt would be responsible for private pay costs. CSW offered resources for private pay caregiver options or facility placement as needed. Pt verbalized understanding and has no specific facility preference if short-term rehab is recommended. Awaiting PT evaluation.   Update- Pt not recommended for STR. CSW met w/ pt and spouse at bedside to review plan for dc. Pt will dc home w/ spouse w/ HHPT/OT/Aide/SW. Pt provided resource for private cg. Pt will need PTAR at dc.

## 2024-07-19 NOTE — ED Notes (Signed)
 Patient transported to CT

## 2024-07-20 NOTE — ED Notes (Signed)
 [facility] called to make sure pt was placed on list for transport. They retook information in case she was not.

## 2024-07-20 NOTE — ED Notes (Signed)
 Pt updated regarding delay. Husband remains at bedside.

## 2024-07-21 NOTE — Progress Notes (Signed)
 LATE ENTRY: TOC/ICM consulted for HH PT, OT, aide, SW. CM spoke c/pt and husband at the beside and [facility] was declined. Husband stated that pt's PCP had already set up [facility] and is set to start this week. Pt's husband stated that it is doubtful that pt will cooperate c/any type of therapy. Resources given to husband for personal care aides and instructed husband to stay in touch c/pt's PCP and [facility] agency r/t long-term needs. Husband verbalized understanding.

## 2024-07-22 LAB — URINE CULTURE

## 2024-07-23 ENCOUNTER — Telehealth (HOSPITAL_BASED_OUTPATIENT_CLINIC_OR_DEPARTMENT_OTHER): Payer: Self-pay | Admitting: *Deleted

## 2024-07-23 NOTE — Telephone Encounter (Signed)
 Post ED Visit - Positive Culture Follow-up  Culture report reviewed by antimicrobial stewardship pharmacist: [facility] [x]  Dorn Buttner, Pharm.D. []  Venetia Gully, Pharm.D., BCPS AQ-ID []  Garrel Crews, Pharm.D., BCPS []  Almarie Lunger, Pharm.D., BCPS []  Thompson, [address].D., BCPS, AAHIVP []  Rosaline Bihari, Pharm.D., BCPS, AAHIVP []  Vernell Meier, PharmD, BCPS []  Latanya Hint, PharmD, BCPS []  Donald Medley, PharmD, BCPS []  Rocky Bold, PharmD []  Dorothyann Alert, PharmD, BCPS []  Morene Babe, PharmD  [facility] []  Rosaline Edison, PharmD []  Romona Bliss, PharmD []  Dolphus Roller, PharmD []  Veva Seip, Rph []  Vernell Daunt) Leonce, PharmD []  Eva Allis, PharmD []  Rosaline Millet, PharmD []  Iantha Batch, PharmD []  Arvin Gauss, PharmD []  Wanda Hasting, PharmD []  Ronal Rav, PharmD []  Rocky Slade, PharmD []  Bard Jeans, PharmD   Positive urine culture Treated with Cephalexin , organism sensitive to the same and no further patient follow-up is required at this time.  Jama Lisle Blondie 07/23/2024, 2:42 PM

## 2024-07-24 ENCOUNTER — Telehealth: Payer: Self-pay

## 2024-07-24 NOTE — Telephone Encounter (Signed)
 Copied from CRM [id number]. Topic: Clinical - Home Health Verbal Orders >> Jul 24, 2024  3:46 PM Delon HERO wrote: Therisa calling from [facility], PT. Is calling for PT orders Frequency- 2 W 1 1 W 8  OT Evaluation, Speech Evaluation, Skilled Nursing Evaluation, Social Work Evaluation  PT is bed bound- Patient fell and broke ankle in 03/2024. Patient is non ambulatory. Husband is unable to transfer the patient in and out of the car.  CB- [phone] Verbal ok on VM

## 2024-07-26 ENCOUNTER — Telehealth: Payer: Self-pay

## 2024-07-26 NOTE — Telephone Encounter (Signed)
 Plea call back and let them know yes to the verbal order

## 2024-07-26 NOTE — Telephone Encounter (Signed)
 LVM for Rachel Black to return call. Please transfer to office when she calls back

## 2024-07-26 NOTE — Telephone Encounter (Signed)
 Copied from CRM [id number]. Topic: General - Transportation >> Jul 26, 2024 11:23 AM Everette C wrote: Reason for CRM: Nathanel Molt Nurse Case Manager with [facility] has requested to please be contacted at [phone] ext [phone] to confirm and discuss the patient's upcoming transportation

## 2024-07-26 NOTE — Telephone Encounter (Signed)
 Copied from CRM [id number]. Topic: General - Transportation >> Jul 26, 2024  9:06 AM Pinkey ORN wrote: Reason for CRM: Requesting Transportation

## 2024-07-26 NOTE — Telephone Encounter (Signed)
 Called [facility] LVM for verbal orders Per Dr. Chandra

## 2024-07-27 ENCOUNTER — Encounter: Payer: Self-pay | Admitting: Family Medicine

## 2024-07-27 ENCOUNTER — Ambulatory Visit: Admitting: Family Medicine

## 2024-07-27 ENCOUNTER — Emergency Department (HOSPITAL_COMMUNITY)

## 2024-07-27 ENCOUNTER — Inpatient Hospital Stay (HOSPITAL_COMMUNITY)
Admission: EM | Admit: 2024-07-27 | Discharge: 2024-08-07 | DRG: 300 | Disposition: A | Discharge status: Skilled Nursing Facility | Attending: Internal Medicine | Admitting: Internal Medicine

## 2024-07-27 VITALS — BP 126/81 | HR 96 | Ht 66.0 in | Wt 197.0 lb

## 2024-07-27 DIAGNOSIS — Z79899 Other long term (current) drug therapy: Secondary | ICD-10-CM

## 2024-07-27 DIAGNOSIS — W19XXXD Unspecified fall, subsequent encounter: Secondary | ICD-10-CM | POA: Diagnosis present

## 2024-07-27 DIAGNOSIS — R5381 Other malaise: Secondary | ICD-10-CM | POA: Insufficient documentation

## 2024-07-27 DIAGNOSIS — F321 Major depressive disorder, single episode, moderate: Secondary | ICD-10-CM | POA: Diagnosis not present

## 2024-07-27 DIAGNOSIS — F32A Depression, unspecified: Secondary | ICD-10-CM | POA: Diagnosis present

## 2024-07-27 DIAGNOSIS — Z781 Physical restraint status: Secondary | ICD-10-CM

## 2024-07-27 DIAGNOSIS — T81718A Complication of other artery following a procedure, not elsewhere classified, initial encounter: Principal | ICD-10-CM | POA: Diagnosis present

## 2024-07-27 DIAGNOSIS — E119 Type 2 diabetes mellitus without complications: Secondary | ICD-10-CM | POA: Diagnosis present

## 2024-07-27 DIAGNOSIS — I2699 Other pulmonary embolism without acute cor pulmonale: Secondary | ICD-10-CM | POA: Diagnosis present

## 2024-07-27 DIAGNOSIS — I2609 Other pulmonary embolism with acute cor pulmonale: Secondary | ICD-10-CM | POA: Diagnosis present

## 2024-07-27 DIAGNOSIS — Z833 Family history of diabetes mellitus: Secondary | ICD-10-CM

## 2024-07-27 DIAGNOSIS — E782 Mixed hyperlipidemia: Secondary | ICD-10-CM | POA: Diagnosis present

## 2024-07-27 DIAGNOSIS — F329 Major depressive disorder, single episode, unspecified: Secondary | ICD-10-CM | POA: Insufficient documentation

## 2024-07-27 DIAGNOSIS — E876 Hypokalemia: Secondary | ICD-10-CM | POA: Diagnosis not present

## 2024-07-27 DIAGNOSIS — Z7901 Long term (current) use of anticoagulants: Secondary | ICD-10-CM

## 2024-07-27 DIAGNOSIS — M170 Bilateral primary osteoarthritis of knee: Secondary | ICD-10-CM | POA: Diagnosis present

## 2024-07-27 DIAGNOSIS — I10 Essential (primary) hypertension: Secondary | ICD-10-CM | POA: Diagnosis present

## 2024-07-27 DIAGNOSIS — E538 Deficiency of other specified B group vitamins: Secondary | ICD-10-CM | POA: Diagnosis present

## 2024-07-27 DIAGNOSIS — I252 Old myocardial infarction: Secondary | ICD-10-CM

## 2024-07-27 DIAGNOSIS — Z96653 Presence of artificial knee joint, bilateral: Secondary | ICD-10-CM | POA: Diagnosis present

## 2024-07-27 DIAGNOSIS — Z7982 Long term (current) use of aspirin: Secondary | ICD-10-CM

## 2024-07-27 DIAGNOSIS — Y838 Other surgical procedures as the cause of abnormal reaction of the patient, or of later complication, without mention of misadventure at the time of the procedure: Secondary | ICD-10-CM | POA: Diagnosis present

## 2024-07-27 DIAGNOSIS — I2510 Atherosclerotic heart disease of native coronary artery without angina pectoris: Secondary | ICD-10-CM | POA: Diagnosis present

## 2024-07-27 DIAGNOSIS — Z86718 Personal history of other venous thrombosis and embolism: Secondary | ICD-10-CM

## 2024-07-27 DIAGNOSIS — Z8673 Personal history of transient ischemic attack (TIA), and cerebral infarction without residual deficits: Secondary | ICD-10-CM

## 2024-07-27 DIAGNOSIS — I82422 Acute embolism and thrombosis of left iliac vein: Secondary | ICD-10-CM | POA: Diagnosis present

## 2024-07-27 DIAGNOSIS — S82852D Displaced trimalleolar fracture of left lower leg, subsequent encounter for closed fracture with routine healing: Secondary | ICD-10-CM

## 2024-07-27 DIAGNOSIS — Z880 Allergy status to penicillin: Secondary | ICD-10-CM

## 2024-07-27 DIAGNOSIS — I82412 Acute embolism and thrombosis of left femoral vein: Secondary | ICD-10-CM | POA: Diagnosis present

## 2024-07-27 DIAGNOSIS — K219 Gastro-esophageal reflux disease without esophagitis: Secondary | ICD-10-CM | POA: Diagnosis present

## 2024-07-27 DIAGNOSIS — Z8249 Family history of ischemic heart disease and other diseases of the circulatory system: Secondary | ICD-10-CM

## 2024-07-27 DIAGNOSIS — Z9049 Acquired absence of other specified parts of digestive tract: Secondary | ICD-10-CM

## 2024-07-27 DIAGNOSIS — Z86711 Personal history of pulmonary embolism: Secondary | ICD-10-CM

## 2024-07-27 DIAGNOSIS — Z823 Family history of stroke: Secondary | ICD-10-CM

## 2024-07-27 DIAGNOSIS — K5900 Constipation, unspecified: Secondary | ICD-10-CM | POA: Diagnosis present

## 2024-07-27 DIAGNOSIS — Z6834 Body mass index (BMI) 34.0-34.9, adult: Secondary | ICD-10-CM

## 2024-07-27 DIAGNOSIS — E66811 Obesity, class 1: Secondary | ICD-10-CM | POA: Diagnosis present

## 2024-07-27 DIAGNOSIS — M109 Gout, unspecified: Secondary | ICD-10-CM | POA: Diagnosis present

## 2024-07-27 DIAGNOSIS — F05 Delirium due to known physiological condition: Secondary | ICD-10-CM | POA: Diagnosis not present

## 2024-07-27 DIAGNOSIS — Z0189 Encounter for other specified special examinations: Secondary | ICD-10-CM | POA: Diagnosis not present

## 2024-07-27 DIAGNOSIS — Z83438 Family history of other disorder of lipoprotein metabolism and other lipidemia: Secondary | ICD-10-CM

## 2024-07-27 DIAGNOSIS — Z90711 Acquired absence of uterus with remaining cervical stump: Secondary | ICD-10-CM

## 2024-07-27 LAB — COMPREHENSIVE METABOLIC PANEL WITH GFR
ALT: 12 U/L (ref 0–44)
AST: 21 U/L (ref 15–41)
Albumin: 3.1 g/dL — ABNORMAL LOW (ref 3.5–5.0)
Alkaline Phosphatase: 63 U/L (ref 38–126)
Anion gap: 14 (ref 5–15)
BUN: 12 mg/dL (ref 8–23)
CO2: 21 mmol/L — ABNORMAL LOW (ref 22–32)
Calcium: 9.7 mg/dL (ref 8.9–10.3)
Chloride: 107 mmol/L (ref 98–111)
Creatinine, Ser: 0.88 mg/dL (ref 0.44–1.00)
GFR, Estimated: >60 mL/min (ref >60)
Glucose, Bld: 108 mg/dL — ABNORMAL HIGH (ref 70–99)
Potassium: 3.9 mmol/L (ref 3.5–5.1)
Sodium: 142 mmol/L (ref 135–145)
Total Bilirubin: 1 mg/dL (ref 0.0–1.2)
Total Protein: 5.9 g/dL — ABNORMAL LOW (ref 6.5–8.1)

## 2024-07-27 LAB — I-STAT CG4 LACTIC ACID, ED
Lactic Acid, Venous: 2.9 mmol/L (ref 0.5–1.9)
Lactic Acid, Venous: 1 mmol/L (ref 0.5–1.9)

## 2024-07-27 LAB — CBC
HCT: 42.8 % (ref 36.0–46.0)
Hemoglobin: 13.8 g/dL (ref 12.0–15.0)
MCH: 32 pg (ref 26.0–34.0)
MCHC: 32.2 g/dL (ref 30.0–36.0)
MCV: 99.3 fL (ref 80.0–100.0)
Platelets: 253 K/uL (ref 150–400)
RBC: 4.31 MIL/uL (ref 3.87–5.11)
RDW: 16.4 % — ABNORMAL HIGH (ref 11.5–15.5)
WBC: 7.7 K/uL (ref 4.0–10.5)
nRBC: 0 % (ref 0.0–0.2)

## 2024-07-27 LAB — TROPONIN I (HIGH SENSITIVITY)
Troponin I (High Sensitivity): 8 ng/L (ref <18)
Troponin I (High Sensitivity): 10 ng/L (ref <18)

## 2024-07-27 LAB — CBG MONITORING, ED
Glucose-Capillary: 78 mg/dL (ref 70–99)
Glucose-Capillary: 85 mg/dL (ref 70–99)

## 2024-07-27 LAB — URINALYSIS, ROUTINE W REFLEX MICROSCOPIC
Bilirubin Urine: NEGATIVE
Glucose, UA: NEGATIVE mg/dL
Hgb urine dipstick: NEGATIVE
Ketones, ur: NEGATIVE mg/dL
Leukocytes,Ua: NEGATIVE
Nitrite: NEGATIVE
Protein, ur: NEGATIVE mg/dL
Specific Gravity, Urine: 1.044 — ABNORMAL HIGH (ref 1.005–1.030)
pH: 5 (ref 5.0–8.0)

## 2024-07-27 LAB — LIPASE, BLOOD: Lipase: 29 U/L (ref 11–51)

## 2024-07-27 LAB — GLUCOSE, CAPILLARY: Glucose-Capillary: 93 mg/dL (ref 70–99)

## 2024-07-27 LAB — HEPARIN LEVEL (UNFRACTIONATED): Heparin Unfractionated: >1.1 [IU]/mL — ABNORMAL HIGH (ref 0.30–0.70)

## 2024-07-27 LAB — BRAIN NATRIURETIC PEPTIDE: B Natriuretic Peptide: 14.6 pg/mL (ref 0.0–100.0)

## 2024-07-27 LAB — HEMOGLOBIN A1C
Hgb A1c MFr Bld: 4.8 % (ref 4.8–5.6)
Mean Plasma Glucose: 91.06 mg/dL

## 2024-07-27 LAB — APTT: aPTT: 31 s (ref 24–36)

## 2024-07-27 MED ORDER — HYDROCODONE-ACETAMINOPHEN 5-325 MG PO TABS
1.0000 | ORAL_TABLET | ORAL | Status: DC | PRN
  Administered 2024-07-27 – 2024-07-28 (×2): 1 via ORAL
  Filled 2024-07-27 (×2): qty 1

## 2024-07-27 MED ORDER — SODIUM CHLORIDE 0.9% FLUSH
3.0000 mL | Freq: Two times a day (BID) | INTRAVENOUS | Status: DC
  Administered 2024-07-27 – 2024-08-07 (×22): 3 mL via INTRAVENOUS

## 2024-07-27 MED ORDER — MORPHINE SULFATE (PF) 2 MG/ML IV SOLN
2.0000 mg | Freq: Once | INTRAVENOUS | Status: AC
  Administered 2024-07-27: 2 mg via INTRAVENOUS
  Filled 2024-07-27: qty 1

## 2024-07-27 MED ORDER — QUETIAPINE FUMARATE 50 MG PO TABS
50.0000 mg | ORAL_TABLET | Freq: Every day | ORAL | Status: DC
  Administered 2024-07-27: 50 mg via ORAL
  Filled 2024-07-27: qty 1

## 2024-07-27 MED ORDER — IOHEXOL 350 MG/ML SOLN
59.0000 mL | Freq: Once | INTRAVENOUS | Status: AC | PRN
  Administered 2024-07-27: 59 mL via INTRAVENOUS

## 2024-07-27 MED ORDER — ONDANSETRON HCL 4 MG/2ML IJ SOLN
4.0000 mg | Freq: Once | INTRAMUSCULAR | Status: AC
  Administered 2024-07-27: 4 mg via INTRAVENOUS
  Filled 2024-07-27: qty 2

## 2024-07-27 MED ORDER — ROSUVASTATIN CALCIUM 20 MG PO TABS
40.0000 mg | ORAL_TABLET | Freq: Every day | ORAL | Status: DC
  Administered 2024-07-27 – 2024-08-07 (×12): 40 mg via ORAL
  Filled 2024-07-27 (×12): qty 2

## 2024-07-27 MED ORDER — HEPARIN (PORCINE) 25000 UT/250ML-% IV SOLN
1000.0000 [IU]/h | INTRAVENOUS | Status: DC
  Administered 2024-07-27: 1400 [IU]/h via INTRAVENOUS
  Administered 2024-07-28: 1250 [IU]/h via INTRAVENOUS
  Filled 2024-07-27 (×2): qty 250

## 2024-07-27 MED ORDER — ALLOPURINOL 300 MG PO TABS
300.0000 mg | ORAL_TABLET | Freq: Two times a day (BID) | ORAL | Status: DC
  Administered 2024-07-27 – 2024-08-07 (×22): 300 mg via ORAL
  Filled 2024-07-27 (×22): qty 1

## 2024-07-27 MED ORDER — PANTOPRAZOLE SODIUM 40 MG PO TBEC
40.0000 mg | DELAYED_RELEASE_TABLET | Freq: Every day | ORAL | Status: DC
  Administered 2024-07-28 – 2024-08-07 (×11): 40 mg via ORAL
  Filled 2024-07-27 (×11): qty 1

## 2024-07-27 MED ORDER — MORPHINE SULFATE (PF) 2 MG/ML IV SOLN: 2.0000 mg | INTRAVENOUS | Status: DC | PRN

## 2024-07-27 MED ORDER — INSULIN ASPART 100 UNIT/ML IJ SOLN
0.0000 [IU] | INTRAMUSCULAR | Status: DC
  Administered 2024-07-30 – 2024-08-02 (×3): 2 [IU] via SUBCUTANEOUS
  Administered 2024-08-03: 3 [IU] via SUBCUTANEOUS
  Administered 2024-08-03 – 2024-08-06 (×6): 2 [IU] via SUBCUTANEOUS
  Filled 2024-07-27 (×10): qty 2

## 2024-07-27 MED ORDER — LACTATED RINGERS IV BOLUS
1000.0000 mL | Freq: Once | INTRAVENOUS | Status: AC
  Administered 2024-07-27: 1000 mL via INTRAVENOUS

## 2024-07-27 MED ORDER — QUETIAPINE FUMARATE 50 MG PO TABS: 50.0000 mg | ORAL_TABLET | Freq: Every day | ORAL | 2 refills | Status: DC

## 2024-07-27 MED ORDER — DULOXETINE HCL 60 MG PO CPEP
60.0000 mg | ORAL_CAPSULE | Freq: Every day | ORAL | Status: DC
  Administered 2024-07-28 – 2024-07-29 (×2): 60 mg via ORAL
  Filled 2024-07-27 (×2): qty 1

## 2024-07-27 MED ORDER — ACETAMINOPHEN 325 MG PO TABS: 650.0000 mg | ORAL_TABLET | Freq: Four times a day (QID) | ORAL | Status: DC | PRN

## 2024-07-27 MED ORDER — ACETAMINOPHEN 650 MG RE SUPP
650.0000 mg | Freq: Four times a day (QID) | RECTAL | Status: DC | PRN
Start: 2024-07-27 — End: 2024-07-29

## 2024-07-27 NOTE — ED Triage Notes (Signed)
 PT BIB PTAR from PCP abd pain and nausea, dizziness x 1 week. PCP did not think she needed to come per EMS.  L. Leg is non-weight bearing.  pT has POA who is not here yet.   126/82 96% RA, HR 96 RR 16

## 2024-07-27 NOTE — Assessment & Plan Note (Signed)
-   Patient reports feeling unwell, depressed, and lacking motivation, which is contributing to non-participation in therapy and functional decline. Has had numerous ED visits for somatic complaints with negative workups. Caregiver raises concern for depression/anxiety. Patient agrees to trial medication for mood. - Start Seroquel 50mg  qhs. - Counseled on medication, including taking it at night due to potential for drowsiness.

## 2024-07-27 NOTE — Progress Notes (Unsigned)
 Established Patient Office Visit  Subjective   Patient ID: Rachel Black, female    DOB: 01/20/1951  Age: 73 y.o. MRN: [id number]  Chief Complaint  Patient presents with   Hospitalization Follow-up    HPI  Subjective - Follow-up regarding left ankle fracture in July with subsequent surgery. Discharged from skilled nursing facility (SNF) on 07/15/2024. - Presented to ED on 07/15/2024 for ankle pain after a fall while attempting to transfer from bed to wheelchair. - Presented to ED on 07/19/2024 for abdominal pain, found to have DVT in left leg between knee and groin. - Reports being bedbound at home since 07/15/2024, using a bedpan for toileting. Caregiver is present 24/7. - Cites weakness and new left knee pain as reasons for immobility. States knee hurts with attempts to stand. Ankle is also painful at rest sometimes. - Reports not making progress with physical therapy at SNF in mid-October, leading to cessation of therapy. Caregiver notes missed sessions due to complaints of stomach pain. - Home health physical therapy evaluated but declined to provide services due to immobility and fall risk, stating need for physician evaluation first. - Caregiver reports that patient is unsafe at home and that they are unable to provide adequate care, as per recommendation from a [employer] case manager. - Patient expresses desire for long-term placement in a SNF ([facility]) and does not want home health services. - Patient reports feeling unwell, with stomach churning and swirly head. No vomiting or SOB - Reports feeling depressed and lacking motivation to participate in physical therapy.  Medications Current medications are from blister packs provided by the rehab center. Reported medications are consistent with her med list in the EHR with the exception of not taking atorvastatin.   PMH, PSH, FH, Social Hx PMHx: Left ankle fracture (July 2025), DVT left leg  (07/19/2024), UTI (07/19/2024), abdominal pain, history of numerous ED visits for abdominal pain and heart-related complaints over past 2 years with negative workups. Uses a walker for ambulation prior to ankle fracture. PSH: Surgery for left ankle fracture (July 2025). Social Hx: Lives at home with caregiver who has FMLA. Discharged from SNF on 07/15/2024. States unsafe to be at home. Has a manual wheelchair and a walker. Bathing is done in bed.  ROS Constitutional: Reports weakness, feeling unwell. Denies fever. GI: Reports abdominal pain, nausea, stomach churning. Denies vomiting, able to keep food down. MSK: Reports left ankle pain and new left knee pain, worse with attempts to bear weight. Neuro: Reports swirly head. Psychiatric: Reports feeling depressed, lack of motivation. Denies prior treatment for depression or anxiety.   The ASCVD Risk score (Arnett DK, et al., 2019) failed to calculate for the following reasons:   Risk score cannot be calculated because patient has a medical history suggesting prior/existing ASCVD  Health Maintenance Due  Topic Date Due   Mammogram  01/05/2019   Diabetic kidney evaluation - Urine ACR  11/26/2020   OPHTHALMOLOGY EXAM  11/21/2021   Pneumococcal Vaccine: 50+ Years (3 of 3 - PCV20 or PCV21) 09/21/2022   HEMOGLOBIN A1C  11/06/2023   DTaP/Tdap/Td (3 - Td or Tdap) 04/10/2024   Influenza Vaccine  04/14/2024   FOOT EXAM  05/11/2024   COVID-19 Vaccine (1 - 2025-26 season) Never done   Colonoscopy  10/13/2024      Objective:     BP 126/81   Pulse 96   Ht 5' 6 (1.676 m)   Wt 197 lb (89.4 kg)  SpO2 95%   BMI 31.80 kg/m  {Vitals History (Optional):23777}  Physical Exam Gen: alert, oriented x 4. On stretcher.  Accompanied by husband and son.  Heent: perrla, eomi.  Cv: rrr, no murmur Pulm: lctab, no wheees Gi: soft, nbs.  Nontender.   Neuro: no focal neuro deficit noted.   Ext: no pedal edema. No leg swelling.  Msk: bedbound.   Deconditioned Psych: irritable.  Blunted affect.    No results found for any visits on 07/27/24.      Assessment & Plan:   Current moderate episode of major depressive disorder, unspecified whether recurrent (HCC) Assessment & Plan: - Patient reports feeling unwell, depressed, and lacking motivation, which is contributing to non-participation in therapy and functional decline. Has had numerous ED visits for somatic complaints with negative workups. Caregiver raises concern for depression/anxiety. Patient agrees to trial medication for mood. - Start Seroquel 50mg  qhs. - Counseled on medication, including taking it at night due to potential for drowsiness.   Physical debility Assessment & Plan: - History of left ankle fracture in July 2025 with surgical repair. Recently discharged from SNF on 07/15/2024 after limited progress in PT. Now bedbound at home, citing weakness, left knee pain, and left ankle pain. Declined to participate in home health PT. Expresses strong desire for long-term care placement rather than home-based services. Caregiver reports situation is unsafe at home. Discussed options of maximizing home care (hospital bed, [facility] lift, PT/OT, home health aide) versus long-term SNF placement. Patient and family have investigated [facility] and would prefer this location due to closeness to their home. . - Will complete FL2 form for placement at [facility]. - Will place referral to value-based care team (social worker, case production designer, theatre/television/film) to assist with placement process. - Counseled patient and caregiver on the nature of long-term care placement, including lack of rehabilitative focus and the financial implications (private pay until spend down for [facility] eligibility). - Re-sent referral for home health physical therapy, OT, nurse.   pt request for ED evaluation Assessment & Plan: - Patient repeatedly requested to go to the emergency department during the visit,  citing feeling generally unwell, stomach upset, ankle pain, and swirly head. - physical exam was not concerning. Does not complain of any symptoms that would warrant ED evaluation.  - Counseled that current symptoms do not represent a clear indication for hospital admission and that ED evaluation would likely result in discharge home. Patient remains autonomous and capable of decision-making. Pt insisting on being evaluated in the ED. - EMS transported pt here.  Spoke with them separately. Their Plan is to either take pt directly to ED or to home first if required, where the pt would then need to request EMS for transport to ED.     Other orders -     QUEtiapine Fumarate; Take 1 tablet (50 mg total) by mouth at bedtime.  Dispense: 30 tablet; Refill: 2   I spent 40 min in the mgmt of this patient   Return in about 5 weeks (around 08/31/2024) for mood, snf placement.    Toribio [name] Slain, MD

## 2024-07-27 NOTE — Progress Notes (Signed)
 ANTICOAGULATION CONSULT NOTE  Pharmacy Consult for Heparin  Indication: pulmonary embolus and DVT  Allergies  Allergen Reactions   Other Other (See Comments)    Some BANDAIDS cause skin irritation    Penicillins Itching     Tolerated Zosyn  couse 10/04/22    Patient Measurements:   Heparin  Dosing Weight: 78.7 kg  Vital Signs: Temp: 97.6 F (36.4 C) (11/13 1458) Temp Source: Oral (11/13 1458) BP: 149/87 (11/13 1458) Pulse Rate: 82 (11/13 1458)  Labs: Recent Labs    07/27/24 1250  HGB 13.8  HCT 42.8  PLT 253  CREATININE 0.88    Estimated Creatinine Clearance: 65 mL/min (by C-G formula based on SCr of 0.88 mg/dL).   Medical History: Past Medical History:  Diagnosis Date   Acute pulmonary embolism (HCC) 01/14/2023   Arthritis    PAIN AND OA BOTH KNEES AND SHOULDERS AND ELBOWS AND WRIST   Blood transfusion without reported diagnosis 2018   after cholecystectomy   Broken foot, right, closed, initial encounter 09/15/2021   no surgery needed, fell at home and went to ED   Choledocholithiasis with obstruction 12/07/2016   Diabetes mellitus    ORAL MEDICATION   Diverticulitis    GERD (gastroesophageal reflux disease)    Gout    NO RECENT FLARE UPS   H/O: rheumatic fever    AS A CHILD - NO KNOWN HEART MURMUR OR HEART PROBLEMS   Heart attack (HCC)    History of rheumatic fever 06/30/2017   Hyperlipidemia    Hypertension    PFO (patent foramen ovale)    Stroke (HCC) 03/2017    Medications:  (Not in a hospital admission)  Scheduled:  Infusions:  PRN:   Assessment: 45f yof with a history of HLD, recent DVT. Patient is presenting with abdominal pain. Heparin  per pharmacy consult placed for pulmonary embolus and DVT.  CTA PE w/ bilateral PE w/ RHS  Patient recently started on apixaban  (11/5) for DVT diagnosis. No PE study at that time. Last dose 11/13 ~8-9am per patient's spouse. Will require aPTT monitoring due to likely falsely high anti-Xa level secondary  to DOAC use.  Hgb 13.8; plt 253  Goal of Therapy:  Heparin  level 0.3-0.7 units/ml aPTT 66-102 seconds Monitor platelets by anticoagulation protocol: Yes   Plan:  No initial heparin  bolus Start heparin  infusion at 1400 units/hr at 8pm tonight Check aPTT & anti-Xa level in 8 hours and daily while on heparin  Continue to monitor via aPTT until levels are correlated Continue to monitor H&H and platelets  Dorn Buttner, PharmD, BCPS 07/27/2024 3:42 PM ED Clinical Pharmacist -  [phone]

## 2024-07-27 NOTE — H&P (Signed)
 History and Physical    Patient: Rachel Black FMW:[id number] DOB: [birth year] DOA: 07/27/2024 DOS: the patient was seen and examined on 07/27/2024 . PCP: Chandra Toribio [name], MD  Patient coming from: Home Chief complaint: PE.  HPI:  Rachel Black is a 73 y.o. female with past medical history  of  hypertension, hyperlipidemia, CVA, DM 2, gout, chronic abdominal pain, IBS, GERD, PFO, She underwent ORIF without internal fixation of posterior malleolus and left ankle syndesmosis by Dr. Barton on 7/9 coming for PE. She was seen recently in ed and found to have Left iliofemoral and distal DVT  and offered thrombectomy by vascular and pt declined on 07/19/2024. Pt was d/c with eliquis  after d/w her about risk and benefits.  Unfortunately with her fall and surgery patient's mobility has declined significantly currently she ambulates with a walker.  She does note that her knees have been hurting prior to this.  Patient came from PCPs office due to abdominal pain nausea dizziness has pain in left leg.  Patient is a limited historian.   ED Course:  Vital signs in the ED were notable for the following:  Vitals:   07/27/24 1715 07/27/24 1730 07/27/24 1830 07/27/24 2014  BP: 131/73  (!) 140/89 131/71  Pulse: 83  92 88  Temp:  97.8 F (36.6 C)  98.2 F (36.8 C)  Resp: 20  18 16   SpO2: 100%  100% 100%  TempSrc:  Oral  Oral   >>ED evaluation thus far shows: Labs: CMP shows bicarb of 21 glucose 108 normal kidney function normal LFTs. Troponin and BNP ordered and pending. CBC without differential within normal limits except for RDW of 16.4. Seems patient does have an underlying history of anemia and hemoglobin today of 13.8 seems that the patient may be possibly hemoconcentrated. Sinus tach at 127 with PVCs left anterior fascicular block.  >>While in the ED patient received the following: Medications  heparin  ADULT infusion 100 units/mL (25000 units/250mL) (has no administration in time range)   morphine  (PF) 2 MG/ML injection 2 mg (2 mg Intravenous Given 07/27/24 1311)  ondansetron  (ZOFRAN ) injection 4 mg (4 mg Intravenous Given 07/27/24 1311)  lactated ringers  bolus 1,000 mL (0 mLs Intravenous Stopped 07/27/24 1459)  iohexol  (OMNIPAQUE ) 350 MG/ML injection 59 mL (59 mLs Intravenous Contrast Given 07/27/24 1409)   ROS Past Medical History:  Diagnosis Date   Acute pulmonary embolism (HCC) 01/14/2023   Arthritis    PAIN AND OA BOTH KNEES AND SHOULDERS AND ELBOWS AND WRIST   Blood transfusion without reported diagnosis 2018   after cholecystectomy   Broken foot, right, closed, initial encounter 09/15/2021   no surgery needed, fell at home and went to ED   Choledocholithiasis with obstruction 12/07/2016   Diabetes mellitus    ORAL MEDICATION   Diverticulitis    GERD (gastroesophageal reflux disease)    Gout    NO RECENT FLARE UPS   H/O: rheumatic fever    AS A CHILD - NO KNOWN HEART MURMUR OR HEART PROBLEMS   Heart attack (HCC)    History of rheumatic fever 06/30/2017   Hyperlipidemia    Hypertension    PFO (patent foramen ovale)    Stroke (HCC) 03/2017   Past Surgical History:  Procedure Laterality Date   CESAREAN SECTION     CHOLECYSTECTOMY N/A 12/09/2016   Procedure: LAPAROSCOPIC CHOLECYSTECTOMY WITH INTRAOPERATIVE CHOLANGIOGRAM;  Surgeon: Jina Nephew, MD;  Location: [facility];  Service: General;  Laterality: N/A;   COLONOSCOPY  COLONOSCOPY WITH PROPOFOL  N/A 01/30/2013   Procedure: COLONOSCOPY WITH PROPOFOL ;  Surgeon: Norleen LOISE Kiang, MD;  Location: [facility];  Service: Endoscopy;  Laterality: N/A;   ERCP N/A 12/08/2016   Procedure: ENDOSCOPIC RETROGRADE CHOLANGIOPANCREATOGRAPHY (ERCP);  Surgeon: Gwendlyn ONEIDA Buddy, MD;  Location: [facility];  Service: Endoscopy;  Laterality: N/A;   ESOPHAGOGASTRODUODENOSCOPY (EGD) WITH PROPOFOL  N/A 01/30/2013   Procedure: ESOPHAGOGASTRODUODENOSCOPY (EGD) WITH PROPOFOL ;  Surgeon: Norleen LOISE Kiang, MD;  Location: [facility];   Service: Endoscopy;  Laterality: N/A;   LEFT HEART CATH AND CORONARY ANGIOGRAPHY N/A 10/18/2017   Procedure: LEFT HEART CATH AND CORONARY ANGIOGRAPHY;  Surgeon: Jordan, Peter M, MD;  Location: [facility];  Service: Cardiovascular;  Laterality: N/A;   LOOP RECORDER INSERTION N/A 03/18/2017   Procedure: Loop Recorder Insertion;  Surgeon: Inocencio Soyla Lunger, MD;  Location: [facility];  Service: Cardiovascular;  Laterality: N/A;   ORIF ANKLE FRACTURE Left 03/22/2024   Procedure: OPEN REDUCTION INTERNAL FIXATION (ORIF) ANKLE FRACTURE;  Surgeon: Barton Drape, MD;  Location: [facility];  Service: Orthopedics;  Laterality: Left;   PARTIAL HYSTERECTOMY     SYNDESMOSIS REPAIR Left 03/22/2024   Procedure: REPAIR, SYNDESMOSIS, ANKLE;  Surgeon: Barton Drape, MD;  Location: [facility];  Service: Orthopedics;  Laterality: Left;   TEE WITHOUT CARDIOVERSION N/A 03/16/2017   Procedure: TRANSESOPHAGEAL ECHOCARDIOGRAM (TEE);  Surgeon: Alveta Aleene PARAS, MD;  Location: [facility];  Service: Cardiovascular;  Laterality: N/A;   TOTAL ABDOMINAL HYSTERECTOMY     TOTAL KNEE ARTHROPLASTY Right 04/20/2014   Procedure: RIGHT TOTAL KNEE ARTHROPLASTY CONVERTED TO RIGHT KNEE REIMPLANTATION;  Surgeon: Lonni CINDERELLA Poli, MD;  Location: [facility];  Service: Orthopedics;  Laterality: Right;   TOTAL KNEE ARTHROPLASTY Left 08/23/2015   Procedure: LEFT TOTAL KNEE ARTHROPLASTY;  Surgeon: Lonni CINDERELLA Poli, MD;  Location: [facility];  Service: Orthopedics;  Laterality: Left;   VENTRAL HERNIA REPAIR     x2    reports that she has never smoked. She has never been exposed to tobacco smoke. She has never used smokeless tobacco. She reports that she does not currently use alcohol. She reports that she does not use drugs. Allergies  Allergen Reactions   Other Other (See Comments)    Some BANDAIDS cause skin irritation    Penicillins Itching     Tolerated Zosyn  couse 10/04/22   Family History  Problem Relation  Age of Onset   Diabetes Mother    Hypertension Mother        entire family   Breast cancer Mother        diagnosed in her 21's   Stroke Father        CVA   Hyperlipidemia Father        entire family   Diabetes Father    Heart disease Father        No details.  Not at an early age.     Crohn's disease Son    Irritable bowel syndrome Son    Colon cancer Neg Hx    Colon polyps Neg Hx    Esophageal cancer Neg Hx    Stomach cancer Neg Hx    Rectal cancer Neg Hx    Prior to Admission medications   Medication Sig Start Date End Date Taking? Authorizing Provider  ACCU-CHEK AVIVA PLUS test strip USE AS INSTRUCTED ONCE A DAY. DX CODE E11.9 07/20/22   Abonza, Maritza, PA-C  Accu-Chek Softclix Lancets lancets Use as instructed once a day.  DX Code: E11.9  03/24/22   Abonza, Maritza, PA-C  acetaminophen  (TYLENOL ) 500 MG tablet Take 1,000 mg by mouth every 6 (six) hours as needed for mild pain or moderate pain.    [provider]  allopurinol  (ZYLOPRIM ) 300 MG tablet TAKE 1 TABLET BY MOUTH TWICE A DAY 05/22/24   Chandra Toribio POUR, MD  APIXABAN  (ELIQUIS ) VTE STARTER PACK (10MG  AND 5MG ) Take as directed on package: start with two-5mg  tablets twice daily until 07/25/2024. On 07/26/2024, switch to one-5mg  tablet twice daily. 07/19/24   Rogelia Jerilynn RAMAN, MD  aspirin  EC (ASPIRIN  LOW DOSE) 81 MG tablet Take 1 tablet by mouth twice a day for 42 days. 03/21/24     Calcium  Carb-Cholecalciferol (CALCIUM  + VITAMIN D3 PO) Take 2 tablets by mouth daily.    [provider]  docusate sodium  (COLACE) 100 MG capsule Take 1 capsule by mouth twice a day for 28 days. 03/21/24     DULoxetine  (CYMBALTA ) 60 MG capsule Take 1 capsule (60 mg total) by mouth daily. 05/12/23   Wallace Search A, PA  Emollient (UDDERLY SMOOTH) CREA Apply 1 application topically See admin instructions. Apply topically to skin folds as needed for rash/irritation    [provider]  famotidine  (PEPCID ) 10 MG tablet Take 10 mg by  mouth daily as needed for heartburn or indigestion.    [provider]  fenofibrate  160 MG tablet Take 1 tablet (160 mg total) by mouth daily. 05/12/23   Wallace Search LABOR, PA  ferrous sulfate  325 (65 FE) MG tablet TAKE 1 TABLET BY MOUTH TWICE A DAY 08/16/23   Abran Norleen SAILOR, MD  gabapentin  (NEURONTIN ) 300 MG capsule TAKE 1 CAPSULE BY MOUTH EVERYDAY AT BEDTIME 07/19/24   Clapp, Kara F, PA-C  isosorbide  mononitrate (IMDUR ) 30 MG 24 hr tablet TAKE 1 TABLET BY MOUTH EVERY DAY 08/31/23   Daneen Damien BROCKS, NP  olmesartan  (BENICAR ) 5 MG tablet Take 1 tablet (5 mg total) by mouth daily. 05/12/23   Wallace Search LABOR, PA  ondansetron  (ZOFRAN -ODT) 4 MG disintegrating tablet Place 1 tablet in mouth and allow to dissolve every 8 hours as needed for 5 days, for nausea. 03/21/24     pantoprazole  (PROTONIX ) 40 MG tablet TAKE 1 TABLET BY MOUTH EVERY DAY 02/18/24   Abran Norleen SAILOR, MD  polyethylene glycol (MIRALAX  / GLYCOLAX ) 17 g packet Take 17 g by mouth daily as needed for moderate constipation.    [provider]  QUEtiapine (SEROQUEL) 50 MG tablet Take 1 tablet (50 mg total) by mouth at bedtime. 07/27/24   Chandra Toribio POUR, MD  rosuvastatin  (CRESTOR ) 40 MG tablet TAKE 1 TABLET BY MOUTH EVERY DAY 07/28/23   Lavona Agent, MD                                                                                 Vitals:   07/27/24 1715 07/27/24 1730 07/27/24 1830 07/27/24 2014  BP: 131/73  (!) 140/89 131/71  Pulse: 83  92 88  Resp: 20  18 16   Temp:  97.8 F (36.6 C)  98.2 F (36.8 C)  TempSrc:  Oral  Oral  SpO2: 100%  100% 100%   Physical Exam Vitals  reviewed.  Constitutional:      General: She is not in acute distress.    Appearance: She is not ill-appearing.  HENT:     Head: Normocephalic and atraumatic.  Eyes:     Extraocular Movements: Extraocular movements intact.  Cardiovascular:     Rate and Rhythm: Normal rate and regular rhythm.     Heart sounds: Normal heart sounds.  Pulmonary:      Breath sounds: Normal breath sounds.  Abdominal:     General: There is no distension.     Palpations: Abdomen is soft.     Tenderness: There is no abdominal tenderness.  Musculoskeletal:     Right lower leg: No edema.     Left lower leg: No edema.  Neurological:     General: No focal deficit present.     Mental Status: She is alert and oriented to person, place, and time.     Labs on Admission: I have personally reviewed following labs and imaging studies CBC: Recent Labs  Lab 07/27/24 1250  WBC 7.7  HGB 13.8  HCT 42.8  MCV 99.3  PLT 253   Basic Metabolic Panel: Recent Labs  Lab 07/27/24 1250  NA 142  K 3.9  CL 107  CO2 21*  GLUCOSE 108*  BUN 12  CREATININE 0.88  CALCIUM  9.7   GFR: Estimated Creatinine Clearance: 65 mL/min (by C-G formula based on SCr of 0.88 mg/dL). Liver Function Tests: Recent Labs  Lab 07/27/24 1250  AST 21  ALT 12  ALKPHOS 63  BILITOT 1.0  PROT 5.9*  ALBUMIN  3.1*   Recent Labs  Lab 07/27/24 1250  LIPASE 29   No results for input(s): AMMONIA in the last 168 hours. Recent Labs    03/15/24 2030 03/19/24 0857 03/20/24 0256 03/22/24 0353 03/22/24 1731 03/24/24 0337 03/24/24 1447 03/27/24 0339 07/19/24 1041 07/27/24 1250  BUN 20 32* 24* 25* 22 32* 32* 26* 9 12  CREATININE 1.31* 1.38* 1.21* 1.23* 1.39* 1.04* 1.02* 1.09* 0.89 0.88    Cardiac Enzymes: No results for input(s): CKTOTAL, CKMB, CKMBINDEX, TROPONINI in the last 168 hours. BNP (last 3 results) No results for input(s): PROBNP in the last 8760 hours. HbA1C: Recent Labs    07/27/24 1608  HGBA1C 4.8   CBG: Recent Labs  Lab 07/27/24 1724 07/27/24 2022  GLUCAP 78 85   Lipid Profile: No results for input(s): CHOL, HDL, LDLCALC, TRIG, CHOLHDL, LDLDIRECT in the last 72 hours. Thyroid  Function Tests: No results for input(s): TSH, T4TOTAL, FREET4, T3FREE, THYROIDAB in the last 72 hours. Anemia Panel: No results for input(s):  VITAMINB12, FOLATE, FERRITIN, TIBC, IRON, RETICCTPCT in the last 72 hours. Urine analysis:    Component Value Date/Time   COLORURINE YELLOW 07/27/2024 1726   APPEARANCEUR CLEAR 07/27/2024 1726   LABSPEC 1.044 (H) 07/27/2024 1726   PHURINE 5.0 07/27/2024 1726   GLUCOSEU NEGATIVE 07/27/2024 1726   HGBUR NEGATIVE 07/27/2024 1726   BILIRUBINUR NEGATIVE 07/27/2024 1726   BILIRUBINUR neg 12/09/2015 1701   KETONESUR NEGATIVE 07/27/2024 1726   PROTEINUR NEGATIVE 07/27/2024 1726   UROBILINOGEN 0.2 12/09/2015 1701   UROBILINOGEN 0.2 04/13/2014 1422   NITRITE NEGATIVE 07/27/2024 1726   LEUKOCYTESUR NEGATIVE 07/27/2024 1726   Radiological Exams on Admission: CT ABDOMEN PELVIS W CONTRAST Result Date: 07/27/2024 CLINICAL DATA:  Abdominal pain.  Possible pulmonary embolism. EXAM: CT ANGIOGRAPHY CHEST CT ABDOMEN AND PELVIS WITH CONTRAST TECHNIQUE: Multidetector CT imaging of the chest was performed using the standard protocol during bolus administration of intravenous  contrast. Multiplanar CT image reconstructions and MIPs were obtained to evaluate the vascular anatomy. Multidetector CT imaging of the abdomen and pelvis was performed using the standard protocol during bolus administration of intravenous contrast. RADIATION DOSE REDUCTION: This exam was performed according to the departmental dose-optimization program which includes automated exposure control, adjustment of the mA and/or kV according to patient size and/or use of iterative reconstruction technique. CONTRAST:  59mL OMNIPAQUE  IOHEXOL  350 MG/ML SOLN COMPARISON:  CT abdomen/pelvis 07/19/2024 and CTA chest 03/16/2022 FINDINGS: CTA CHEST FINDINGS Cardiovascular: Mild cardiomegaly slightly worse. The thoracic aorta is normal in caliber and otherwise unremarkable. Pulmonary arterial system is adequately opacified demonstrates a linear embolus over the left main pulmonary artery with small embolus over a proximal lingular pulmonary artery.  Small emboli are present over the proximal right upper, middle and lower lobar pulmonary arteries. RV/LV ratio = 52/41 compatible with right heart strain. Remaining vascular structures are unremarkable. Mediastinum/Nodes: No evidence of mediastinal or hilar adenopathy. Remaining mediastinal structures are unremarkable. Lungs/Pleura: Lungs are adequately inflated without acute airspace process or effusion. Airways are unremarkable. Musculoskeletal: No focal abnormality. Review of the MIP images confirms the above findings. CT ABDOMEN and PELVIS FINDINGS Hepatobiliary: Previous cholecystectomy. Air within the biliary tree and stable prominence of the common bile duct unchanged likely due to patient's post cholecystectomy state. No focal liver mass. Pancreas: Normal. Spleen: Normal. Adrenals/Urinary Tract: Adrenal glands are normal. Kidneys are normal in size. No significant hydronephrosis or nephrolithiasis. Stable 1.5 cm left renal cyst. No further imaging follow-up is recommended. Ureters and bladder are normal. Stomach/Bowel: Stomach and small bowel are normal. Appendix is normal. Colon is decompressed from the a Paddock flexure distally. There is mild diverticulosis of the colon. Vascular/Lymphatic: Abdominal aorta is normal in caliber. Remaining vascular structures again notable for low-density within the left external iliac vein suspicious for thrombus. No adenopathy. Reproductive: Status post hysterectomy. No adnexal masses. Other: No free fluid or focal inflammatory change. Tiny ventral hernia over the epigastric region containing only peritoneal fat. Evidence of previous ventral hernia repair in the midline of the supraumbilical region. Musculoskeletal: Stable grade 1 anterolisthesis of L3 on L4. No focal abnormalities. Review of the MIP images confirms the above findings. IMPRESSION: 1. Evidence of bilateral pulmonary emboli. Evidence of right heart strain. 2. Mild cardiomegaly slightly worse. 3. No acute  findings in the abdomen/pelvis. 4. Mild colonic diverticulosis. 5. Stable low-density within the left external iliac vein suspicious for thrombus as described previously. Continue to recommend left lower extremity venous ultrasound for further evaluation if not done. 6. Stable grade 1 anterolisthesis of L3 on L4. Critical Value/emergent results were called by telephone at the time of interpretation on 07/27/2024 at 2:50 pm to provider ANDREW TEE , who verbally acknowledged these results. Electronically Signed   By: Toribio Agreste M.D.   On: 07/27/2024 14:50   CT Angio Chest PE W and/or Wo Contrast Result Date: 07/27/2024 CLINICAL DATA:  Abdominal pain.  Possible pulmonary embolism. EXAM: CT ANGIOGRAPHY CHEST CT ABDOMEN AND PELVIS WITH CONTRAST TECHNIQUE: Multidetector CT imaging of the chest was performed using the standard protocol during bolus administration of intravenous contrast. Multiplanar CT image reconstructions and MIPs were obtained to evaluate the vascular anatomy. Multidetector CT imaging of the abdomen and pelvis was performed using the standard protocol during bolus administration of intravenous contrast. RADIATION DOSE REDUCTION: This exam was performed according to the departmental dose-optimization program which includes automated exposure control, adjustment of the mA and/or kV according to patient size  and/or use of iterative reconstruction technique. CONTRAST:  59mL OMNIPAQUE  IOHEXOL  350 MG/ML SOLN COMPARISON:  CT abdomen/pelvis 07/19/2024 and CTA chest 03/16/2022 FINDINGS: CTA CHEST FINDINGS Cardiovascular: Mild cardiomegaly slightly worse. The thoracic aorta is normal in caliber and otherwise unremarkable. Pulmonary arterial system is adequately opacified demonstrates a linear embolus over the left main pulmonary artery with small embolus over a proximal lingular pulmonary artery. Small emboli are present over the proximal right upper, middle and lower lobar pulmonary arteries. RV/LV ratio  = 52/41 compatible with right heart strain. Remaining vascular structures are unremarkable. Mediastinum/Nodes: No evidence of mediastinal or hilar adenopathy. Remaining mediastinal structures are unremarkable. Lungs/Pleura: Lungs are adequately inflated without acute airspace process or effusion. Airways are unremarkable. Musculoskeletal: No focal abnormality. Review of the MIP images confirms the above findings. CT ABDOMEN and PELVIS FINDINGS Hepatobiliary: Previous cholecystectomy. Air within the biliary tree and stable prominence of the common bile duct unchanged likely due to patient's post cholecystectomy state. No focal liver mass. Pancreas: Normal. Spleen: Normal. Adrenals/Urinary Tract: Adrenal glands are normal. Kidneys are normal in size. No significant hydronephrosis or nephrolithiasis. Stable 1.5 cm left renal cyst. No further imaging follow-up is recommended. Ureters and bladder are normal. Stomach/Bowel: Stomach and small bowel are normal. Appendix is normal. Colon is decompressed from the a Paddock flexure distally. There is mild diverticulosis of the colon. Vascular/Lymphatic: Abdominal aorta is normal in caliber. Remaining vascular structures again notable for low-density within the left external iliac vein suspicious for thrombus. No adenopathy. Reproductive: Status post hysterectomy. No adnexal masses. Other: No free fluid or focal inflammatory change. Tiny ventral hernia over the epigastric region containing only peritoneal fat. Evidence of previous ventral hernia repair in the midline of the supraumbilical region. Musculoskeletal: Stable grade 1 anterolisthesis of L3 on L4. No focal abnormalities. Review of the MIP images confirms the above findings. IMPRESSION: 1. Evidence of bilateral pulmonary emboli. Evidence of right heart strain. 2. Mild cardiomegaly slightly worse. 3. No acute findings in the abdomen/pelvis. 4. Mild colonic diverticulosis. 5. Stable low-density within the left external  iliac vein suspicious for thrombus as described previously. Continue to recommend left lower extremity venous ultrasound for further evaluation if not done. 6. Stable grade 1 anterolisthesis of L3 on L4. Critical Value/emergent results were called by telephone at the time of interpretation on 07/27/2024 at 2:50 pm to provider ANDREW TEE , who verbally acknowledged these results. Electronically Signed   By: Toribio Agreste M.D.   On: 07/27/2024 14:50   Data Reviewed: Relevant notes from primary care and specialist visits, past discharge summaries as available in EHR, including Care Everywhere . Prior diagnostic testing as pertinent to current admission diagnoses, Updated medications and problem lists for reconciliation .ED course, including vitals, labs, imaging, treatment and response to treatment,Triage notes, nursing and pharmacy notes and ED provider's notes.Notable results as noted in HPI.Discussed case with EDMD/ ED APP/ or Specialty MD on call and as needed.  Assessment & Plan  >> PE/left iliofemoral DVT: Discussed with patient that with the findings of her CT she may need need to go undergo thrombectomy and will discuss with IR and review her test results and make a plan until then she will be continued on blood thinner IV patient agrees with plan as to spouse at bedside. Will admit to progressive unit and follow.  2D echocardiogram.  >> Diabetes mellitus type 2: Glycemic protocol.   >> History of CAD: Continue patient on rosuvastatin .  Continued on heparin  gtt.   >> Essential  hypertension: Vitals:   07/27/24 1219 07/27/24 1458 07/27/24 1515 07/27/24 1530  BP: (!) 139/97 (!) 149/87 118/72 132/80   07/27/24 1545 07/27/24 1600 07/27/24 1615 07/27/24 1630  BP: 129/65 125/78 (!) 124/95 137/88   07/27/24 1645 07/27/24 1715 07/27/24 1830 07/27/24 2014  BP: (!) 173/78 131/73 (!) 140/89 131/71  With patient's blood pressure fluctuation I will currently hold her Imdur  and Benicar .  A.m. MD to  resume once deemed appropriate currently will use as needed hydralazine .    DVT prophylaxis:  Heparin  GTT Consults:  None  Advance Care Planning:    Code Status: Full Code   Family Communication:  Spouse at bedside Disposition Plan:  To be determined Severity of Illness: The appropriate patient status for this patient is INPATIENT. Inpatient status is judged to be reasonable and necessary in order to provide the required intensity of service to ensure the patient's safety. The patient's presenting symptoms, physical exam findings, and initial radiographic and laboratory data in the context of their chronic comorbidities is felt to place them at high risk for further clinical deterioration. Furthermore, it is not anticipated that the patient will be medically stable for discharge from the hospital within 2 midnights of admission.   * I certify that at the point of admission it is my clinical judgment that the patient will require inpatient hospital care spanning beyond 2 midnights from the point of admission due to high intensity of service, high risk for further deterioration and high frequency of surveillance required.*  Unresulted Labs (From admission, onward)     Start     Ordered   07/29/24 0500  Heparin  level (unfractionated)  Daily,   R      07/27/24 1553   07/29/24 0500  APTT  Daily,   R      07/27/24 1553   07/28/24 0500  Comprehensive metabolic panel  Tomorrow morning,   R        07/27/24 1610   07/28/24 0500  CBC  Tomorrow morning,   R        07/27/24 1610   07/28/24 0400  APTT  Once-Timed,   STAT        07/27/24 1553   07/28/24 0400  Heparin  level (unfractionated)  Once-Timed,   URGENT        07/27/24 1553            Meds ordered this encounter  Medications   morphine  (PF) 2 MG/ML injection 2 mg   ondansetron  (ZOFRAN ) injection 4 mg   lactated ringers  bolus 1,000 mL   iohexol  (OMNIPAQUE ) 350 MG/ML injection 59 mL   heparin  ADULT infusion 100 units/mL (25000  units/250mL)   allopurinol  (ZYLOPRIM ) tablet 300 mg   DULoxetine  (CYMBALTA ) DR capsule 60 mg   rosuvastatin  (CRESTOR ) tablet 40 mg   QUEtiapine (SEROQUEL) tablet 50 mg   pantoprazole  (PROTONIX ) EC tablet 40 mg   insulin  aspart (novoLOG ) injection 0-15 Units    Correction coverage::   Moderate (average weight, post-op)    CBG < 70::   implement hypoglycemia protocol    CBG 70 - 120::   0 units    CBG 121 - 150::   2 units    CBG 151 - 200::   3 units    CBG 201 - 250::   5 units    CBG 251 - 300::   8 units    CBG 301 - 350::   11 units    CBG 351 - 400::  15 units    CBG > 400:   call MD and obtain STAT lab verification   sodium chloride  flush (NS) 0.9 % injection 3 mL   OR Linked Order Group    acetaminophen  (TYLENOL ) tablet 650 mg    acetaminophen  (TYLENOL ) suppository 650 mg   HYDROcodone -acetaminophen  (NORCO/VICODIN) 5-325 MG per tablet 1 tablet    Refill:  0   morphine  (PF) 2 MG/ML injection 2 mg     Orders Placed This Encounter  Procedures   CT ABDOMEN PELVIS W CONTRAST   CT Angio Chest PE W and/or Wo Contrast   Lipase, blood   Comprehensive metabolic panel   CBC   Urinalysis, Routine w reflex microscopic -Urine, Clean Catch   Brain natriuretic peptide   APTT   APTT   Heparin  level (unfractionated)   Heparin  level (unfractionated)   Heparin  level (unfractionated)   APTT   Hemoglobin A1c   Comprehensive metabolic panel   CBC   Diet Carb Modified Fluid consistency: Thin; Room service appropriate? Yes   Diet NPO time specified   ED Cardiac monitoring   Apply Diabetes Mellitus Care Plan   STAT CBG when hypoglycemia is suspected. If treated, recheck every 15 minutes after each treatment until CBG >/= 70 mg/dl   Refer to Hypoglycemia Protocol Sidebar Report for treatment of CBG < 70 mg/dl   No HS correction Insulin    Maintain IV access   Vital signs   Notify physician (specify)   Refer to Sidebar Report Mobility Protocol for Adult Inpatient   Initiate Adult  Central Line Maintenance and Catheter Clearance Protocol for patients with central line (CVC, PICC, Port, Hemodialysis, Trialysis)   Daily weights   Intake and Output   Initiate CHG Protocol for patients in ICU/SD or any patient with a central line or foley catheter   Do not place and if present remove PureWick   Initiate Oral Care Protocol   Initiate Carrier Fluid Protocol   RN may order General Admission PRN Orders utilizing General Admission PRN medications (through manage orders) for the following patient needs: allergy  symptoms (Claritin), cold sores (Carmex), cough (Robitussin DM), eye irritation (Liquifilm Tears), hemorrhoids (Tucks), indigestion (Maalox), minor skin irritation (Hydrocortisone  Cream), muscle pain (Ben Gay), nose irritation (saline nasal spray) and sore throat (Chloraseptic spray).   Cardiac Monitoring - Continuous Indefinite   Ambulate with assistance   Full code   Consult to hospitalist   heparin  per pharmacy consult   monitoring by pharmacy   Pulse oximetry check with vital signs   Oxygen therapy Mode or (Route): Nasal cannula; Liters Per Minute: 2; Keep O2 saturation between: greater than 92 %   I-Stat CG4 Lactic Acid   CBG monitoring, ED   CBG monitoring, ED   EKG 12-Lead   ED EKG   ED EKG   ECHOCARDIOGRAM COMPLETE BUBBLE STUDY   Admit to Inpatient (patient's expected length of stay will be greater than 2 midnights or inpatient only procedure)   Aspiration precautions   Fall precautions    Author: Mario LULLA Blanch, MD 12 pm- 8 pm. [facility]. 07/27/2024 8:35 PM Please note for any communication after hours contact [facility] Assigned provider on call on Amion.

## 2024-07-27 NOTE — Patient Instructions (Signed)
 It was nice to see you today,  We addressed the following topics today: - I have prescribed Seroquel for you. Please take one pill every night. You can take it with or without food, but taking it at night is best as it may make you drowsy. - We will fill out the [facility] form needed for the [facility] and put in a referral to our social work team to help you with that process. - If you start vomiting, cannot keep food or water  down, or feel significantly worse, you should go to the emergency department.  Have a great day,  Rolan Slain, MD

## 2024-07-27 NOTE — Assessment & Plan Note (Signed)
-   History of left ankle fracture in July 2025 with surgical repair. Recently discharged from SNF on 07/15/2024 after limited progress in PT. Now bedbound at home, citing weakness, left knee pain, and left ankle pain. Declined to participate in home health PT. Expresses strong desire for long-term care placement rather than home-based services. Caregiver reports situation is unsafe at home. Discussed options of maximizing home care (hospital bed, [facility] lift, PT/OT, home health aide) versus long-term SNF placement. Patient and family have investigated [facility] and would prefer this location due to closeness to their home. . - Will complete FL2 form for placement at [facility]. - Will place referral to value-based care team (social worker, case production designer, theatre/television/film) to assist with placement process. - Counseled patient and caregiver on the nature of long-term care placement, including lack of rehabilitative focus and the financial implications (private pay until spend down for [facility] eligibility). - Re-sent referral for home health physical therapy, OT, nurse.

## 2024-07-27 NOTE — Assessment & Plan Note (Signed)
-   Patient repeatedly requested to go to the emergency department during the visit, citing feeling generally unwell, stomach upset, ankle pain, and swirly head. - physical exam was not concerning. Does not complain of any symptoms that would warrant ED evaluation.  - Counseled that current symptoms do not represent a clear indication for hospital admission and that ED evaluation would likely result in discharge home. Patient remains autonomous and capable of decision-making. Pt insisting on being evaluated in the ED. - EMS transported pt here.  Spoke with them separately. Their Plan is to either take pt directly to ED or to home first if required, where the pt would then need to request EMS for transport to ED.

## 2024-07-27 NOTE — ED Provider Notes (Signed)
 [place] EMERGENCY DEPARTMENT AT [facility] Provider Note   CSN: [id number] Arrival date & time: 07/27/24  1215     Patient presents with: No chief complaint on file.   Rachel Black is a 73 y.o. female.   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  The patient states that this has been going on for a long time.  She states that it hurts all over.  She is somewhat of a poor historian.  She does report some nausea but denies any vomiting.  Denies any blood in her stool or dark stools.  She denies any urinary symptoms or fevers.  She came to the emergency department due to the symptoms.        Prior to Admission medications   Medication Sig Start Date End Date Taking? Authorizing Provider  ACCU-CHEK AVIVA PLUS test strip USE AS INSTRUCTED ONCE A DAY. DX CODE E11.9 07/20/22   Abonza, Maritza, PA-C  Accu-Chek Softclix Lancets lancets Use as instructed once a day.  DX Code: E11.9 03/24/22   Abonza, Maritza, PA-C  acetaminophen  (TYLENOL ) 500 MG tablet Take 1,000 mg by mouth every 6 (six) hours as needed for mild pain or moderate pain.    [provider]  allopurinol  (ZYLOPRIM ) 300 MG tablet TAKE 1 TABLET BY MOUTH TWICE A DAY 05/22/24   Chandra Toribio [name], MD  APIXABAN  (ELIQUIS ) VTE STARTER PACK (10MG  AND 5MG ) Take as directed on package: start with two-5mg  tablets twice daily until 07/25/2024. On 07/26/2024, switch to one-5mg  tablet twice daily. 07/19/24   Rogelia Jerilynn RAMAN, MD  aspirin  EC (ASPIRIN  LOW DOSE) 81 MG tablet Take 1 tablet by mouth twice a day for 42 days. 03/21/24     Calcium  Carb-Cholecalciferol (CALCIUM  + VITAMIN D3 PO) Take 2 tablets by mouth daily.    [provider]  docusate sodium  (COLACE) 100 MG capsule Take 1 capsule by mouth twice a day for 28 days. 03/21/24     DULoxetine  (CYMBALTA ) 60 MG capsule Take 1 capsule  (60 mg total) by mouth daily. 05/12/23   Wallace Search A, PA  Emollient (UDDERLY SMOOTH) CREA Apply 1 application topically See admin instructions. Apply topically to skin folds as needed for rash/irritation    [provider]  famotidine  (PEPCID ) 10 MG tablet Take 10 mg by mouth daily as needed for heartburn or indigestion.    [provider]  fenofibrate  160 MG tablet Take 1 tablet (160 mg total) by mouth daily. 05/12/23   Wallace Search LABOR, PA  ferrous sulfate  325 (65 FE) MG tablet TAKE 1 TABLET BY MOUTH TWICE A DAY 08/16/23   Abran Norleen SAILOR, MD  gabapentin  (NEURONTIN ) 300 MG capsule TAKE 1 CAPSULE BY MOUTH EVERYDAY AT BEDTIME 07/19/24   [facility], Kara F, PA-C  isosorbide  mononitrate (IMDUR ) 30 MG 24 hr tablet TAKE 1 TABLET BY MOUTH EVERY DAY 08/31/23   Daneen Damien BROCKS, NP  olmesartan  (BENICAR ) 5 MG tablet Take 1 tablet (5 mg total) by mouth daily. 05/12/23   Wallace Search LABOR, PA  ondansetron  (ZOFRAN -ODT) 4 MG disintegrating tablet Place 1 tablet in mouth and allow to dissolve every 8 hours as needed for 5 days, for nausea. 03/21/24     pantoprazole  (PROTONIX ) 40 MG tablet TAKE 1 TABLET BY MOUTH EVERY DAY 02/18/24   Abran Norleen SAILOR, MD  polyethylene glycol (MIRALAX  / GLYCOLAX ) 17 g packet Take 17  g by mouth daily as needed for moderate constipation.    [provider]  QUEtiapine (SEROQUEL) 50 MG tablet Take 1 tablet (50 mg total) by mouth at bedtime. 07/27/24   Chandra Toribio POUR, MD  rosuvastatin  (CRESTOR ) 40 MG tablet TAKE 1 TABLET BY MOUTH EVERY DAY 07/28/23   Lavona Agent, MD    Allergies: Other and Penicillins    Review of Systems  Reason unable to perform ROS: Poor historian.  Gastrointestinal:  Positive for abdominal pain.  All other systems reviewed and are negative.   Updated Vital Signs BP (!) 149/87 (BP Location: Left Arm)   Pulse 82   Temp 97.6 F (36.4 C) (Oral)   Resp 18   SpO2 100%   Physical Exam Vitals and nursing note reviewed.   Gen: NAD Eyes:  PERRL, EOMI HEENT: no oropharyngeal swelling Neck: trachea midline Resp: clear to auscultation bilaterally Card: Tachycardic, no murmurs, rubs, or gallops Abd: Mild diffuse tenderness without guarding or rebound Extremities: no calf tenderness, no edema Vascular: 2+ radial pulses bilaterally, 2+ DP pulses bilaterally Skin: no rashes Psyc: acting appropriately   (all labs ordered are listed, but only abnormal results are displayed) Labs Reviewed  COMPREHENSIVE METABOLIC PANEL WITH GFR - Abnormal; Notable for the following components:      Result Value   CO2 21 (*)    Glucose, Bld 108 (*)    Total Protein 5.9 (*)    Albumin  3.1 (*)    All other components within normal limits  CBC - Abnormal; Notable for the following components:   RDW 16.4 (*)    All other components within normal limits  I-STAT CG4 LACTIC ACID, ED - Abnormal; Notable for the following components:   Lactic Acid, Venous 2.9 (*)    All other components within normal limits  LIPASE, BLOOD  URINALYSIS, ROUTINE W REFLEX MICROSCOPIC  BRAIN NATRIURETIC PEPTIDE  APTT  HEPARIN  LEVEL (UNFRACTIONATED)  APTT  HEPARIN  LEVEL (UNFRACTIONATED)  I-STAT CG4 LACTIC ACID, ED  TROPONIN I (HIGH SENSITIVITY)    EKG: EKG Interpretation Date/Time:  Thursday July 27 2024 12:26:59 EST Ventricular Rate:  127 PR Interval:  148 QRS Duration:  90 QT Interval:  310 QTC Calculation: 450 R Axis:   -68  Text Interpretation: Sinus tachycardia with frequent Premature ventricular complexes Left anterior fascicular block Abnormal ECG When compared with ECG of 19-Mar-2024 09:03, PREVIOUS ECG IS PRESENT Confirmed by Ula Barter [phone]) on 07/27/2024 1:37:03 PM  Radiology: CT ABDOMEN PELVIS W CONTRAST Result Date: 07/27/2024 CLINICAL DATA:  Abdominal pain.  Possible pulmonary embolism. EXAM: CT ANGIOGRAPHY CHEST CT ABDOMEN AND PELVIS WITH CONTRAST TECHNIQUE: Multidetector CT imaging of the chest was performed using the standard  protocol during bolus administration of intravenous contrast. Multiplanar CT image reconstructions and MIPs were obtained to evaluate the vascular anatomy. Multidetector CT imaging of the abdomen and pelvis was performed using the standard protocol during bolus administration of intravenous contrast. RADIATION DOSE REDUCTION: This exam was performed according to the departmental dose-optimization program which includes automated exposure control, adjustment of the mA and/or kV according to patient size and/or use of iterative reconstruction technique. CONTRAST:  59mL OMNIPAQUE  IOHEXOL  350 MG/ML SOLN COMPARISON:  CT abdomen/pelvis 07/19/2024 and CTA chest 03/16/2022 FINDINGS: CTA CHEST FINDINGS Cardiovascular: Mild cardiomegaly slightly worse. The thoracic aorta is normal in caliber and otherwise unremarkable. Pulmonary arterial system is adequately opacified demonstrates a linear embolus over the left main pulmonary artery with small embolus over a proximal lingular pulmonary artery. Small emboli  are present over the proximal right upper, middle and lower lobar pulmonary arteries. RV/LV ratio = 52/41 compatible with right heart strain. Remaining vascular structures are unremarkable. Mediastinum/Nodes: No evidence of mediastinal or hilar adenopathy. Remaining mediastinal structures are unremarkable. Lungs/Pleura: Lungs are adequately inflated without acute airspace process or effusion. Airways are unremarkable. Musculoskeletal: No focal abnormality. Review of the MIP images confirms the above findings. CT ABDOMEN and PELVIS FINDINGS Hepatobiliary: Previous cholecystectomy. Air within the biliary tree and stable prominence of the common bile duct unchanged likely due to patient's post cholecystectomy state. No focal liver mass. Pancreas: Normal. Spleen: Normal. Adrenals/Urinary Tract: Adrenal glands are normal. Kidneys are normal in size. No significant hydronephrosis or nephrolithiasis. Stable 1.5 cm left renal cyst.  No further imaging follow-up is recommended. Ureters and bladder are normal. Stomach/Bowel: Stomach and small bowel are normal. Appendix is normal. Colon is decompressed from the a Paddock flexure distally. There is mild diverticulosis of the colon. Vascular/Lymphatic: Abdominal aorta is normal in caliber. Remaining vascular structures again notable for low-density within the left external iliac vein suspicious for thrombus. No adenopathy. Reproductive: Status post hysterectomy. No adnexal masses. Other: No free fluid or focal inflammatory change. Tiny ventral hernia over the epigastric region containing only peritoneal fat. Evidence of previous ventral hernia repair in the midline of the supraumbilical region. Musculoskeletal: Stable grade 1 anterolisthesis of L3 on L4. No focal abnormalities. Review of the MIP images confirms the above findings. IMPRESSION: 1. Evidence of bilateral pulmonary emboli. Evidence of right heart strain. 2. Mild cardiomegaly slightly worse. 3. No acute findings in the abdomen/pelvis. 4. Mild colonic diverticulosis. 5. Stable low-density within the left external iliac vein suspicious for thrombus as described previously. Continue to recommend left lower extremity venous ultrasound for further evaluation if not done. 6. Stable grade 1 anterolisthesis of L3 on L4. Critical Value/emergent results were called by telephone at the time of interpretation on 07/27/2024 at 2:50 pm to provider Korri Ask , who verbally acknowledged these results. Electronically Signed   By: Toribio Agreste M.D.   On: 07/27/2024 14:50   CT Angio Chest PE W and/or Wo Contrast Result Date: 07/27/2024 CLINICAL DATA:  Abdominal pain.  Possible pulmonary embolism. EXAM: CT ANGIOGRAPHY CHEST CT ABDOMEN AND PELVIS WITH CONTRAST TECHNIQUE: Multidetector CT imaging of the chest was performed using the standard protocol during bolus administration of intravenous contrast. Multiplanar CT image reconstructions and MIPs were  obtained to evaluate the vascular anatomy. Multidetector CT imaging of the abdomen and pelvis was performed using the standard protocol during bolus administration of intravenous contrast. RADIATION DOSE REDUCTION: This exam was performed according to the departmental dose-optimization program which includes automated exposure control, adjustment of the mA and/or kV according to patient size and/or use of iterative reconstruction technique. CONTRAST:  59mL OMNIPAQUE  IOHEXOL  350 MG/ML SOLN COMPARISON:  CT abdomen/pelvis 07/19/2024 and CTA chest 03/16/2022 FINDINGS: CTA CHEST FINDINGS Cardiovascular: Mild cardiomegaly slightly worse. The thoracic aorta is normal in caliber and otherwise unremarkable. Pulmonary arterial system is adequately opacified demonstrates a linear embolus over the left main pulmonary artery with small embolus over a proximal lingular pulmonary artery. Small emboli are present over the proximal right upper, middle and lower lobar pulmonary arteries. RV/LV ratio = 52/41 compatible with right heart strain. Remaining vascular structures are unremarkable. Mediastinum/Nodes: No evidence of mediastinal or hilar adenopathy. Remaining mediastinal structures are unremarkable. Lungs/Pleura: Lungs are adequately inflated without acute airspace process or effusion. Airways are unremarkable. Musculoskeletal: No focal abnormality. Review of the MIP  images confirms the above findings. CT ABDOMEN and PELVIS FINDINGS Hepatobiliary: Previous cholecystectomy. Air within the biliary tree and stable prominence of the common bile duct unchanged likely due to patient's post cholecystectomy state. No focal liver mass. Pancreas: Normal. Spleen: Normal. Adrenals/Urinary Tract: Adrenal glands are normal. Kidneys are normal in size. No significant hydronephrosis or nephrolithiasis. Stable 1.5 cm left renal cyst. No further imaging follow-up is recommended. Ureters and bladder are normal. Stomach/Bowel: Stomach and small  bowel are normal. Appendix is normal. Colon is decompressed from the a Paddock flexure distally. There is mild diverticulosis of the colon. Vascular/Lymphatic: Abdominal aorta is normal in caliber. Remaining vascular structures again notable for low-density within the left external iliac vein suspicious for thrombus. No adenopathy. Reproductive: Status post hysterectomy. No adnexal masses. Other: No free fluid or focal inflammatory change. Tiny ventral hernia over the epigastric region containing only peritoneal fat. Evidence of previous ventral hernia repair in the midline of the supraumbilical region. Musculoskeletal: Stable grade 1 anterolisthesis of L3 on L4. No focal abnormalities. Review of the MIP images confirms the above findings. IMPRESSION: 1. Evidence of bilateral pulmonary emboli. Evidence of right heart strain. 2. Mild cardiomegaly slightly worse. 3. No acute findings in the abdomen/pelvis. 4. Mild colonic diverticulosis. 5. Stable low-density within the left external iliac vein suspicious for thrombus as described previously. Continue to recommend left lower extremity venous ultrasound for further evaluation if not done. 6. Stable grade 1 anterolisthesis of L3 on L4. Critical Value/emergent results were called by telephone at the time of interpretation on 07/27/2024 at 2:50 pm to provider Vanna Shavers , who verbally acknowledged these results. Electronically Signed   By: Toribio Agreste M.D.   On: 07/27/2024 14:50     Procedures   Medications Ordered in the ED  heparin  ADULT infusion 100 units/mL (25000 units/236mL) (has no administration in time range)  allopurinol  (ZYLOPRIM ) tablet 300 mg (has no administration in time range)  DULoxetine  (CYMBALTA ) DR capsule 60 mg (has no administration in time range)  rosuvastatin  (CRESTOR ) tablet 40 mg (has no administration in time range)  QUEtiapine (SEROQUEL) tablet 50 mg (has no administration in time range)  pantoprazole  (PROTONIX ) EC tablet 40 mg  (has no administration in time range)  morphine  (PF) 2 MG/ML injection 2 mg (2 mg Intravenous Given 07/27/24 1311)  ondansetron  (ZOFRAN ) injection 4 mg (4 mg Intravenous Given 07/27/24 1311)  lactated ringers  bolus 1,000 mL (0 mLs Intravenous Stopped 07/27/24 1459)  iohexol  (OMNIPAQUE ) 350 MG/ML injection 59 mL (59 mLs Intravenous Contrast Given 07/27/24 1409)                                    Medical Decision Making 73 year old female presenting to the emergency department today with abdominal pain.  I will further evaluate the patient here with basic labs well as LFTs and a lipase to evaluate for hepatobiliary pathology or pancreatitis.  Will obtain a CT scan of her abdomen to evaluate for appendicitis, diverticulitis, colitis, perforated viscus, obstruction, or other intra-abdominal pathology.  I will give the patient morphine  Zofran  for symptoms and reevaluate for ultimate disposition.  The patient's labs are reassuring.  She remained mildly tachycardic here.  CT angiogram was added on to eval for pulmonary embolism given recent DVT.  This does show bilateral PEs with right heart strain.  The patient's blood pressures remained stable.  Heart rate improved with IV fluids.  Heparin  infusion is  ordered and troponin and BNP are added on.  A call was placed to hospitalist service for admission.  CRITICAL CARE Performed by: Prentice JONELLE Medicus   Total critical care time: 30 minutes  Critical care time was exclusive of separately billable procedures and treating other patients.  Critical care was necessary to treat or prevent imminent or life-threatening deterioration.  Critical care was time spent personally by me on the following activities: development of treatment plan with patient and/or surrogate as well as nursing, discussions with consultants, evaluation of patient's response to treatment, examination of patient, obtaining history from patient or surrogate, ordering and performing treatments  and interventions, ordering and review of laboratory studies, ordering and review of radiographic studies, pulse oximetry and re-evaluation of patient's condition.   Amount and/or Complexity of Data Reviewed Labs: ordered. Radiology: ordered.  Risk Prescription drug management. Decision regarding hospitalization.        Final diagnoses:  Other pulmonary embolism without acute cor pulmonale, unspecified chronicity [facility])    ED Discharge Orders     None          Medicus Prentice JONELLE, MD 07/27/24 [phone]

## 2024-07-28 ENCOUNTER — Inpatient Hospital Stay (HOSPITAL_COMMUNITY)

## 2024-07-28 DIAGNOSIS — I82409 Acute embolism and thrombosis of unspecified deep veins of unspecified lower extremity: Secondary | ICD-10-CM | POA: Diagnosis not present

## 2024-07-28 DIAGNOSIS — I2609 Other pulmonary embolism with acute cor pulmonale: Secondary | ICD-10-CM | POA: Diagnosis not present

## 2024-07-28 DIAGNOSIS — I2602 Saddle embolus of pulmonary artery with acute cor pulmonale: Secondary | ICD-10-CM | POA: Diagnosis not present

## 2024-07-28 LAB — GLUCOSE, CAPILLARY
Glucose-Capillary: 101 mg/dL — ABNORMAL HIGH (ref 70–99)
Glucose-Capillary: 78 mg/dL (ref 70–99)
Glucose-Capillary: 83 mg/dL (ref 70–99)
Glucose-Capillary: 88 mg/dL (ref 70–99)
Glucose-Capillary: 89 mg/dL (ref 70–99)
Glucose-Capillary: 96 mg/dL (ref 70–99)

## 2024-07-28 LAB — APTT
aPTT: 108 s — ABNORMAL HIGH (ref 24–36)
aPTT: 70 s — ABNORMAL HIGH (ref 24–36)
aPTT: 102 s — ABNORMAL HIGH (ref 24–36)

## 2024-07-28 LAB — COMPREHENSIVE METABOLIC PANEL WITH GFR
ALT: 9 U/L (ref 0–44)
AST: 14 U/L — ABNORMAL LOW (ref 15–41)
Albumin: 2.2 g/dL — ABNORMAL LOW (ref 3.5–5.0)
Alkaline Phosphatase: 48 U/L (ref 38–126)
Anion gap: 11 (ref 5–15)
BUN: 10 mg/dL (ref 8–23)
CO2: 23 mmol/L (ref 22–32)
Calcium: 8.9 mg/dL (ref 8.9–10.3)
Chloride: 108 mmol/L (ref 98–111)
Creatinine, Ser: 0.85 mg/dL (ref 0.44–1.00)
GFR, Estimated: >60 mL/min (ref >60)
Glucose, Bld: 84 mg/dL (ref 70–99)
Potassium: 2.9 mmol/L — ABNORMAL LOW (ref 3.5–5.1)
Sodium: 142 mmol/L (ref 135–145)
Total Bilirubin: 0.8 mg/dL (ref 0.0–1.2)
Total Protein: 4.3 g/dL — ABNORMAL LOW (ref 6.5–8.1)

## 2024-07-28 LAB — CBC
HCT: 32.8 % — ABNORMAL LOW (ref 36.0–46.0)
Hemoglobin: 10.4 g/dL — ABNORMAL LOW (ref 12.0–15.0)
MCH: 31.4 pg (ref 26.0–34.0)
MCHC: 31.7 g/dL (ref 30.0–36.0)
MCV: 99.1 fL (ref 80.0–100.0)
Platelets: 180 K/uL (ref 150–400)
RBC: 3.31 MIL/uL — ABNORMAL LOW (ref 3.87–5.11)
RDW: 16.3 % — ABNORMAL HIGH (ref 11.5–15.5)
WBC: 6.5 K/uL (ref 4.0–10.5)
nRBC: 0 % (ref 0.0–0.2)

## 2024-07-28 LAB — HEPARIN LEVEL (UNFRACTIONATED)
Heparin Unfractionated: >1.1 [IU]/mL — ABNORMAL HIGH (ref 0.30–0.70)
Heparin Unfractionated: >1.1 [IU]/mL — ABNORMAL HIGH (ref 0.30–0.70)

## 2024-07-28 LAB — ECHOCARDIOGRAM COMPLETE
Area-P 1/2: 4.6 cm2
S' Lateral: 3.6 cm

## 2024-07-28 MED ORDER — MORPHINE SULFATE (PF) 2 MG/ML IV SOLN: 1.0000 mg | INTRAVENOUS | Status: DC | PRN

## 2024-07-28 MED ORDER — CYANOCOBALAMIN 1000 MCG/ML IJ SOLN
1000.0000 ug | INTRAMUSCULAR | Status: DC
  Administered 2024-08-04: 1000 ug via SUBCUTANEOUS
  Filled 2024-07-28: qty 1

## 2024-07-28 MED ORDER — FOLIC ACID 1 MG PO TABS
1.0000 mg | ORAL_TABLET | Freq: Every day | ORAL | Status: DC
  Administered 2024-07-29 – 2024-08-07 (×10): 1 mg via ORAL
  Filled 2024-07-28 (×10): qty 1

## 2024-07-28 MED ORDER — POTASSIUM CHLORIDE CRYS ER 20 MEQ PO TBCR: 40.0000 meq | EXTENDED_RELEASE_TABLET | Freq: Once | ORAL | Status: DC

## 2024-07-28 MED ORDER — CYANOCOBALAMIN 1000 MCG/ML IJ SOLN
1000.0000 ug | Freq: Every day | INTRAMUSCULAR | Status: AC
  Administered 2024-07-29 – 2024-08-03 (×6): 1000 ug via SUBCUTANEOUS
  Filled 2024-07-28 (×6): qty 1

## 2024-07-28 MED ORDER — SODIUM CHLORIDE 0.9 % IV BOLUS
500.0000 mL | Freq: Once | INTRAVENOUS | Status: AC
  Administered 2024-07-28: 500 mL via INTRAVENOUS

## 2024-07-28 MED ORDER — POTASSIUM CHLORIDE 10 MEQ/100ML IV SOLN
10.0000 meq | Freq: Once | INTRAVENOUS | Status: AC
  Administered 2024-07-28: 10 meq via INTRAVENOUS
  Filled 2024-07-28: qty 100

## 2024-07-28 MED ORDER — POTASSIUM CHLORIDE CRYS ER 20 MEQ PO TBCR
30.0000 meq | EXTENDED_RELEASE_TABLET | Freq: Four times a day (QID) | ORAL | Status: AC
  Administered 2024-07-28 (×2): 30 meq via ORAL
  Filled 2024-07-28 (×2): qty 1

## 2024-07-28 NOTE — Plan of Care (Signed)
  Problem: Tissue Perfusion: Goal: Adequacy of tissue perfusion will improve Outcome: Progressing   Problem: Education: Goal: Knowledge of General Education information will improve Description: Including pain rating scale, medication(s)/side effects and non-pharmacologic comfort measures Outcome: Progressing   Problem: Clinical Measurements: Goal: Diagnostic test results will improve Outcome: Progressing   Problem: Nutrition: Goal: Adequate nutrition will be maintained Outcome: Progressing

## 2024-07-28 NOTE — Progress Notes (Signed)
  Echocardiogram 2D Echocardiogram has been performed.  Tinnie FORBES Gosling RDCS 07/28/2024, 3:08 PM

## 2024-07-28 NOTE — TOC Initial Note (Signed)
 Transition of Care [facility]) - Initial/Assessment Note    Patient Details  Name: Rachel Black MRN: [id number] Date of Birth: 27-Jan-1951  Transition of Care [facility]) CM/SW Contact:    Inocente GORMAN Kindle, LCSW Phone Number: 07/28/2024, 9:21 AM  Clinical Narrative:                 CSW confirmed with [employer] that patient has usd her 34 commercial SNF days, which will not restart until January. Spouse was paying privately for two weeks until [facility] discharged patient. They will not be abe to accept patient back private pay if recommended. CSW will continue to follow.     Barriers to Discharge: Continued Medical Work up   Patient Goals and CMS Choice            Expected Discharge Plan and Services In-house Referral: Clinical Social Work     Living arrangements for the past 2 months: Single Family Home                                      Prior Living Arrangements/Services Living arrangements for the past 2 months: Single Family Home Lives with:: Spouse Patient language and need for interpreter reviewed:: Yes Do you feel safe going back to the place where you live?: Yes      Need for Family Participation in Patient Care: Yes (Comment) Care giver support system in place?: Yes (comment)   Criminal Activity/Legal Involvement Pertinent to Current Situation/Hospitalization: No - Comment as needed  Activities of Daily Living      Permission Sought/Granted Permission sought to share information with : Facility Medical Sales Representative, Family Supports Permission granted to share information with : Yes, Verbal Permission Granted  Share Information with NAME: Rachel Black [phone] / [phone]           Emotional Assessment Appearance:: Appears stated age     Orientation: : Oriented to Self, Oriented to Place, Oriented to  Time, Oriented to Situation Alcohol / Substance Use: Not Applicable Psych Involvement: No (comment)  Admission diagnosis:   Acute pulmonary embolism (HCC) [I26.99] Other pulmonary embolism without acute cor pulmonale, unspecified chronicity (HCC) [I26.99] Patient Active Problem List   Diagnosis Date Noted   MDD (major depressive disorder) 07/27/2024   Physical debility 07/27/2024   pt request for ED evaluation 07/27/2024   Acute pulmonary embolism (HCC) 07/27/2024   Left trimalleolar fracture 03/19/2024   Fall at home, initial encounter 03/19/2024   Macrocytic anemia 03/19/2024   Anxiety 01/31/2023   CAD S/P percutaneous coronary angioplasty 01/14/2023   Atopic dermatitis 11/01/2022   Inflammatory polyarthropathy (HCC) 11/01/2022   Palpitations 10/25/2022   Irritable bowel syndrome with constipation 03/16/2022   Influenza B 09/16/2021   GERD without esophagitis 11/26/2020   Generalized abdominal pain 11/26/2020   Family history of breast cancer in mother 11/27/2019   Cataract of both eyes 11/27/2019   Coronary artery disease involving native coronary artery of native heart without angina pectoris 02/18/2018   NSTEMI (non-ST elevated myocardial infarction) (HCC)    PFO (patent foramen ovale)    H/O: CVA (cerebrovascular accident) 03/14/2017   Elevated LFTs    Osteoarthritis of right knee 08/23/2015   Status post total left knee replacement 08/23/2015   Arthritis of knee, right 04/20/2014   Status post total right knee replacement 04/20/2014   Flushing reaction 03/10/2012   Type 2 diabetes mellitus with other specified complication (  HCC) 02/17/2007   GOUT 02/17/2007   Severe obesity (BMI >= 40) (HCC) 02/17/2007   Mixed hyperlipidemia 02/17/2007   Hypertension associated with diabetes (HCC) 02/17/2007   PCP:  Chandra Toribio POUR, MD Pharmacy:   [facility] [address], [place] - [address] [place] [address] Phone: [phone] Fax: [phone]     Social Drivers of Health (SDOH) Social History: SDOH Screenings   Food Insecurity: No Food Insecurity (07/27/2024)   Housing: Low Risk  (07/27/2024)  Transportation Needs: No Transportation Needs (07/27/2024)  Utilities: Not At Risk (07/27/2024)  Depression (PHQ2-9): High Risk (07/27/2024)  Social Connections: Moderately Integrated (07/27/2024)  Tobacco Use: Low Risk  (07/27/2024)   SDOH Interventions:     Readmission Risk Interventions    03/28/2024   10:35 AM  Readmission Risk Prevention Plan  Transportation Screening Complete  PCP or Specialist Appt within 5-7 Days Complete  Home Care Screening Complete  Medication Review (RN CM) Complete

## 2024-07-28 NOTE — Progress Notes (Signed)
 ANTICOAGULATION CONSULT NOTE  Pharmacy Consult for Heparin  Indication: pulmonary embolus and DVT  Allergies  Allergen Reactions   Other Other (See Comments)    Some BANDAIDS cause skin irritation    Penicillins Itching     Tolerated Zosyn  couse 10/04/22    Patient Measurements: Weight: 87.3 kg (192 lb 7.4 oz) Heparin  Dosing Weight: 78.7 kg  Vital Signs: Temp: 98.5 F (36.9 C) (11/14 1557) Temp Source: Oral (11/14 1557) BP: 113/62 (11/14 1557) Pulse Rate: 97 (11/14 1557)  Labs: Recent Labs    07/27/24 1250 07/27/24 1531 07/27/24 1553 07/27/24 1553 07/27/24 1731 07/28/24 0352 07/28/24 1355 07/28/24 2044  HGB 13.8  --   --   --   --  10.4*  --   --   HCT 42.8  --   --   --   --  32.8*  --   --   PLT 253  --   --   --   --  180  --   --   APTT  --   --  31   < >  --  108* 70* 102*  HEPARINUNFRC  --   --  >1.10*  --   --  >1.10* >1.10*  --   CREATININE 0.88  --   --   --   --  0.85  --   --   TROPONINIHS  --  8  --   --  10  --   --   --    < > = values in this interval not displayed.    Estimated Creatinine Clearance: 66.6 mL/min (by C-G formula based on SCr of 0.85 mg/dL).   Medical History: Past Medical History:  Diagnosis Date   Acute pulmonary embolism (HCC) 01/14/2023   Arthritis    PAIN AND OA BOTH KNEES AND SHOULDERS AND ELBOWS AND WRIST   Blood transfusion without reported diagnosis 2018   after cholecystectomy   Broken foot, right, closed, initial encounter 09/15/2021   no surgery needed, fell at home and went to ED   Choledocholithiasis with obstruction 12/07/2016   Diabetes mellitus    ORAL MEDICATION   Diverticulitis    GERD (gastroesophageal reflux disease)    Gout    NO RECENT FLARE UPS   H/O: rheumatic fever    AS A CHILD - NO KNOWN HEART MURMUR OR HEART PROBLEMS   Heart attack (HCC)    History of rheumatic fever 06/30/2017   Hyperlipidemia    Hypertension    PFO (patent foramen ovale)    Stroke (HCC) 03/2017    Medications:   Medications Prior to Admission  Medication Sig Dispense Refill Last Dose/Taking   acetaminophen  (TYLENOL ) 500 MG tablet Take 1,000 mg by mouth every 6 (six) hours as needed for mild pain or moderate pain.   07/27/2024 Morning   allopurinol  (ZYLOPRIM ) 300 MG tablet TAKE 1 TABLET BY MOUTH TWICE A DAY 180 tablet 1 07/27/2024 Morning   apixaban  (ELIQUIS ) 5 MG TABS tablet Take 5 mg by mouth 2 (two) times daily.   07/27/2024 at  8:30 AM   DULoxetine  (CYMBALTA ) 60 MG capsule Take 1 capsule (60 mg total) by mouth daily. 90 capsule 3 07/27/2024 Morning   fenofibrate  160 MG tablet Take 1 tablet (160 mg total) by mouth daily. 90 tablet 3 07/27/2024 Morning   folic acid  (FOLVITE ) 1 MG tablet Take 1 mg by mouth daily.   07/27/2024 Morning   gabapentin  (NEURONTIN ) 300 MG capsule TAKE 1 CAPSULE BY  MOUTH EVERYDAY AT BEDTIME 30 capsule 1 07/26/2024   isosorbide  mononitrate (IMDUR ) 30 MG 24 hr tablet TAKE 1 TABLET BY MOUTH EVERY DAY 90 tablet 3 07/27/2024 Morning   olmesartan  (BENICAR ) 5 MG tablet Take 1 tablet (5 mg total) by mouth daily. 90 tablet 3 07/27/2024 Morning   pantoprazole  (PROTONIX ) 40 MG tablet TAKE 1 TABLET BY MOUTH EVERY DAY 90 tablet 1 07/27/2024 Morning   potassium chloride  SA (KLOR-CON  M) 20 MEQ tablet Take 40 mEq by mouth daily.   07/27/2024 Morning   ACCU-CHEK AVIVA PLUS test strip USE AS INSTRUCTED ONCE A DAY. DX CODE E11.9 100 strip 1    Accu-Chek Softclix Lancets lancets Use as instructed once a day.  DX Code: E11.9 100 each 1    QUEtiapine (SEROQUEL) 50 MG tablet Take 1 tablet (50 mg total) by mouth at bedtime. (Patient not taking: Reported on 07/27/2024) 30 tablet 2 Not Taking   rosuvastatin  (CRESTOR ) 40 MG tablet TAKE 1 TABLET BY MOUTH EVERY DAY (Patient not taking: Reported on 07/27/2024) 90 tablet 2 Not Taking   Scheduled:   allopurinol   300 mg Oral BID   cyanocobalamin   1,000 mcg Subcutaneous Daily   Followed by   NOREEN ON 08/04/2024] cyanocobalamin   1,000 mcg Subcutaneous Q30  days   DULoxetine   60 mg Oral Daily   folic acid   1 mg Oral Daily   insulin  aspart  0-15 Units Subcutaneous Q4H   pantoprazole   40 mg Oral Daily   potassium chloride   40 mEq Oral Once   rosuvastatin   40 mg Oral Daily   sodium chloride  flush  3 mL Intravenous Q12H   Infusions:   heparin  1,250 Units/hr (07/28/24 2010)   PRN: acetaminophen  **OR** acetaminophen , HYDROcodone -acetaminophen , morphine  injection  Assessment: 34f yof with a history of HLD, recent DVT. Patient is presenting with abdominal pain. Heparin  per pharmacy consult placed for pulmonary embolus and DVT.  CTA PE w/ bilateral PE w/ RHS  Patient recently started on apixaban  (11/5) for DVT diagnosis. No PE study at that time. Last dose 11/13 ~8-9am per patient's spouse. Will require aPTT monitoring due to likely falsely high anti-Xa level secondary to DOAC use.   11/14 2100 update: aPTT high end of therapeutic at 102. Will slightly reduce infusion rate.  Goal of Therapy:  Heparin  level 0.3-0.7 units/ml aPTT 66-102 seconds Monitor platelets by anticoagulation protocol: Yes   Plan:  Reduce heparin  to 1200 units/hr Check aPTT and heparin  level in AM and daily while on heparin  Continue to monitor via aPTT until levels are correlated Continue to monitor H&H and platelets  Larraine Brazier, PharmD Clinical Pharmacist 07/28/2024  9:31 PM **Pharmacist phone directory can now be found on [email or web address] ([email or web address]).  Listed under [facility].

## 2024-07-28 NOTE — Progress Notes (Signed)
 ANTICOAGULATION CONSULT NOTE  Pharmacy Consult for Heparin  Indication: pulmonary embolus and DVT  Allergies  Allergen Reactions   Other Other (See Comments)    Some BANDAIDS cause skin irritation    Penicillins Itching     Tolerated Zosyn  couse 10/04/22    Patient Measurements:   Heparin  Dosing Weight: 78.7 kg  Vital Signs: Temp: 97.5 F (36.4 C) (11/14 0401) Temp Source: Axillary (11/14 0401) BP: 96/50 (11/14 0000) Pulse Rate: 48 (11/14 0000)  Labs: Recent Labs    07/27/24 1250 07/27/24 1531 07/27/24 1553 07/27/24 1731 07/28/24 0352  HGB 13.8  --   --   --  10.4*  HCT 42.8  --   --   --  32.8*  PLT 253  --   --   --  180  APTT  --   --  31  --  108*  HEPARINUNFRC  --   --  >1.10*  --  >1.10*  CREATININE 0.88  --   --   --  0.85  TROPONINIHS  --  8  --  10  --     Estimated Creatinine Clearance: 67.3 mL/min (by C-G formula based on SCr of 0.85 mg/dL).   Medical History: Past Medical History:  Diagnosis Date   Acute pulmonary embolism (HCC) 01/14/2023   Arthritis    PAIN AND OA BOTH KNEES AND SHOULDERS AND ELBOWS AND WRIST   Blood transfusion without reported diagnosis 2018   after cholecystectomy   Broken foot, right, closed, initial encounter 09/15/2021   no surgery needed, fell at home and went to ED   Choledocholithiasis with obstruction 12/07/2016   Diabetes mellitus    ORAL MEDICATION   Diverticulitis    GERD (gastroesophageal reflux disease)    Gout    NO RECENT FLARE UPS   H/O: rheumatic fever    AS A CHILD - NO KNOWN HEART MURMUR OR HEART PROBLEMS   Heart attack (HCC)    History of rheumatic fever 06/30/2017   Hyperlipidemia    Hypertension    PFO (patent foramen ovale)    Stroke (HCC) 03/2017    Medications:  Medications Prior to Admission  Medication Sig Dispense Refill Last Dose/Taking   acetaminophen  (TYLENOL ) 500 MG tablet Take 1,000 mg by mouth every 6 (six) hours as needed for mild pain or moderate pain.   07/27/2024 Morning    allopurinol  (ZYLOPRIM ) 300 MG tablet TAKE 1 TABLET BY MOUTH TWICE A DAY 180 tablet 1 07/27/2024 Morning   apixaban  (ELIQUIS ) 5 MG TABS tablet Take 5 mg by mouth 2 (two) times daily.   07/27/2024 at  8:30 AM   DULoxetine  (CYMBALTA ) 60 MG capsule Take 1 capsule (60 mg total) by mouth daily. 90 capsule 3 07/27/2024 Morning   fenofibrate  160 MG tablet Take 1 tablet (160 mg total) by mouth daily. 90 tablet 3 07/27/2024 Morning   folic acid  (FOLVITE ) 1 MG tablet Take 1 mg by mouth daily.   07/27/2024 Morning   gabapentin  (NEURONTIN ) 300 MG capsule TAKE 1 CAPSULE BY MOUTH EVERYDAY AT BEDTIME 30 capsule 1 07/26/2024   isosorbide  mononitrate (IMDUR ) 30 MG 24 hr tablet TAKE 1 TABLET BY MOUTH EVERY DAY 90 tablet 3 07/27/2024 Morning   olmesartan  (BENICAR ) 5 MG tablet Take 1 tablet (5 mg total) by mouth daily. 90 tablet 3 07/27/2024 Morning   pantoprazole  (PROTONIX ) 40 MG tablet TAKE 1 TABLET BY MOUTH EVERY DAY 90 tablet 1 07/27/2024 Morning   potassium chloride  SA (KLOR-CON  M) 20 MEQ tablet  Take 40 mEq by mouth daily.   07/27/2024 Morning   ACCU-CHEK AVIVA PLUS test strip USE AS INSTRUCTED ONCE A DAY. DX CODE E11.9 100 strip 1    Accu-Chek Softclix Lancets lancets Use as instructed once a day.  DX Code: E11.9 100 each 1    QUEtiapine (SEROQUEL) 50 MG tablet Take 1 tablet (50 mg total) by mouth at bedtime. (Patient not taking: Reported on 07/27/2024) 30 tablet 2 Not Taking   rosuvastatin  (CRESTOR ) 40 MG tablet TAKE 1 TABLET BY MOUTH EVERY DAY (Patient not taking: Reported on 07/27/2024) 90 tablet 2 Not Taking   Scheduled:   allopurinol   300 mg Oral BID   DULoxetine   60 mg Oral Daily   insulin  aspart  0-15 Units Subcutaneous Q4H   pantoprazole   40 mg Oral Daily   QUEtiapine  50 mg Oral QHS   rosuvastatin   40 mg Oral Daily   sodium chloride  flush  3 mL Intravenous Q12H   Infusions:   heparin  1,400 Units/hr (07/27/24 2020)   PRN: acetaminophen  **OR** acetaminophen , HYDROcodone -acetaminophen , morphine   injection  Assessment: 32f yof with a history of HLD, recent DVT. Patient is presenting with abdominal pain. Heparin  per pharmacy consult placed for pulmonary embolus and DVT.  CTA PE w/ bilateral PE w/ RHS  Patient recently started on apixaban  (11/5) for DVT diagnosis. No PE study at that time. Last dose 11/13 ~8-9am per patient's spouse. Will require aPTT monitoring due to likely falsely high anti-Xa level secondary to DOAC use.  Hgb 13.8; plt 253  11/14 AM update:  aPTT supra-therapeutic   Goal of Therapy:  Heparin  level 0.3-0.7 units/ml aPTT 66-102 seconds Monitor platelets by anticoagulation protocol: Yes   Plan:  Dec heparin  to 1250 units/hr Check aPTT & anti-Xa level in 8 hours and daily while on heparin  Continue to monitor via aPTT until levels are correlated Continue to monitor H&H and platelets  Lynwood Mckusick, PharmD, BCPS Clinical Pharmacist Phone: [phone]

## 2024-07-28 NOTE — Progress Notes (Addendum)
 Progress note   Rachel Black  FMW:[id number] DOB: Aug 19, 1951 DOA: 07/27/2024 PCP: Chandra Toribio [name], MD   No chief complaint on file.   Brief Narrative:   Rachel Black is a 73 y.o. female with past medical history  of  hypertension, hyperlipidemia, CVA, DM 2, gout, chronic abdominal pain, IBS, GERD, PFO, She underwent ORIF without internal fixation of posterior malleolus and left ankle syndesmosis by Dr. Barton on 7/9, husband at bedside assists with the history, apparently she was at the SNF facility until 07/15/2024, unfortunately she was still nonambulatory,  She was seen recently in ed and found to have Left iliofemoral and distal DVT  and offered thrombectomy by vascular and pt declined on 07/19/2024. Pt was d/c with eliquis , she presents to ED secondary to generalized pain, CTA chest has been obtained, which was significant for bilateral PE with right heart strain, so she is admitted for further workup.  Assessment & Plan:   Active Problems:   Acute pulmonary embolism (HCC)  Bilateral PE with right heart strain Acute DVT in left lower extremity. - Patient has been on Eliquis  for 7 days, her workup in ED significant for bilateral PE with right heart strain. - Will obtain 2D echo to evaluate for right heart dysfunction. - Repeat venous Dopplers of left lower extremity to see if there is any extension of her acute DVT. - Continue with heparin  drip for now, ending her workup and further evaluation to see if she is a candidate for thrombectomy for PE or DVT . -continue with bedrest pending her workup  Hypertension -blood pressure is borderline low this morning, will hold home medication Imdur  and olmesartan  -If remains low will start on low-dose midodrine.   Anemia B12 deficiency Folic acid  deficiency -Recent workup significant for B12 and folic acid , will continue with supplement    Mixed hyperlipidemia  Continue with fenofibrate    GERD Continue Protonix     Hx  Gout Continue allopurinol    Obesity, class I Body mass index is 31.06 kg/m.  Deconditioning -This post recent left ankle fracture, will consult PT/OT when medically stable  Hypokalemia - Replaced    DVT prophylaxis: (Heparin  drip) Code Status: (Full) Family Communication: (Discussed with husband at bedside) Disposition:   Status is: Inpatient    Consultants:  None   Subjective:  She denies any complaints this morning, husband at bedside as well denies any significant events overnight  Objective: Vitals:   07/28/24 0401 07/28/24 0500 07/28/24 0554 07/28/24 0740  BP:   (!) 99/54 122/64  Pulse:   83 72  Resp:   16 18  Temp: (!) 97.5 F (36.4 C)  98 F (36.7 C) 98.2 F (36.8 C)  TempSrc: Axillary   Oral  SpO2:   95% 97%  Weight:  87.3 kg      Intake/Output Summary (Last 24 hours) at 07/28/2024 0915 Last data filed at 07/27/2024 1459 Gross per 24 hour  Intake 1000 ml  Output --  Net 1000 ml   Filed Weights   07/28/24 0500  Weight: 87.3 kg    Examination:  Sleeping comfortably, but wakes up, answering questions and follow commands. Symmetrical Chest wall movement, Good air movement bilaterally, CTAB RRR,No Gallops,Rubs or new Murmurs, No Parasternal Heave +ve B.Sounds, Abd Soft, No tenderness, No rebound - guarding or rigidity. No Cyanosis, Clubbing or edema, No new Rash or bruise       Data Reviewed: I have personally reviewed following labs and imaging studies  CBC:  Recent Labs  Lab 07/27/24 1250 07/28/24 0352  WBC 7.7 6.5  HGB 13.8 10.4*  HCT 42.8 32.8*  MCV 99.3 99.1  PLT 253 180    Basic Metabolic Panel: Recent Labs  Lab 07/27/24 1250 07/28/24 0352  NA 142 142  K 3.9 2.9*  CL 107 108  CO2 21* 23  GLUCOSE 108* 84  BUN 12 10  CREATININE 0.88 0.85  CALCIUM  9.7 8.9    GFR: Estimated Creatinine Clearance: 66.6 mL/min (by C-G formula based on SCr of 0.85 mg/dL).  Liver Function Tests: Recent Labs  Lab 07/27/24 1250  07/28/24 0352  AST 21 14*  ALT 12 9  ALKPHOS 63 48  BILITOT 1.0 0.8  PROT 5.9* 4.3*  ALBUMIN  3.1* 2.2*    CBG: Recent Labs  Lab 07/27/24 2022 07/27/24 2155 07/28/24 0057 07/28/24 0509 07/28/24 0739  GLUCAP 85 93 89 83 78     Recent Results (from the past 240 hours)  Urine Culture     Status: Abnormal   Collection Time: 07/19/24 10:01 AM   Specimen: Urine, Clean Catch  Result Value Ref Range Status   Specimen Description URINE, CLEAN CATCH  Final   Special Requests   Final    NONE Performed at [facility], [address], [place], [place] [address]    Culture   Final    Two isolates with different morphologies were identified as the same organism.The most resistant organism was reported. >=100,000 COLONIES/mL ESCHERICHIA COLI    Report Status 07/22/2024 FINAL  Final   Organism ID, Bacteria ESCHERICHIA COLI (A)  Final      Susceptibility   Escherichia coli - MIC*    AMPICILLIN 16 INTERMEDIATE Intermediate     CEFAZOLIN  (URINE) Value in next row Sensitive      <=1 SENSITIVEThis is a modified FDA-approved test that has been validated and its performance characteristics determined by the reporting laboratory.  This laboratory is certified under the Clinical Laboratory Improvement Amendments CLIA as qualified to perform high complexity clinical laboratory testing.    CEFEPIME Value in next row Sensitive      <=1 SENSITIVEThis is a modified FDA-approved test that has been validated and its performance characteristics determined by the reporting laboratory.  This laboratory is certified under the Clinical Laboratory Improvement Amendments CLIA as qualified to perform high complexity clinical laboratory testing.    ERTAPENEM Value in next row Sensitive      <=1 SENSITIVEThis is a modified FDA-approved test that has been validated and its performance characteristics determined by the reporting laboratory.  This laboratory is certified under the Clinical Laboratory  Improvement Amendments CLIA as qualified to perform high complexity clinical laboratory testing.    CEFTRIAXONE  Value in next row Sensitive      <=1 SENSITIVEThis is a modified FDA-approved test that has been validated and its performance characteristics determined by the reporting laboratory.  This laboratory is certified under the Clinical Laboratory Improvement Amendments CLIA as qualified to perform high complexity clinical laboratory testing.    CIPROFLOXACIN  Value in next row Sensitive      <=1 SENSITIVEThis is a modified FDA-approved test that has been validated and its performance characteristics determined by the reporting laboratory.  This laboratory is certified under the Clinical Laboratory Improvement Amendments CLIA as qualified to perform high complexity clinical laboratory testing.    GENTAMICIN Value in next row Sensitive      <=1 SENSITIVEThis is a modified FDA-approved test that has been validated and its performance  characteristics determined by the reporting laboratory.  This laboratory is certified under the Clinical Laboratory Improvement Amendments CLIA as qualified to perform high complexity clinical laboratory testing.    NITROFURANTOIN Value in next row Sensitive      <=1 SENSITIVEThis is a modified FDA-approved test that has been validated and its performance characteristics determined by the reporting laboratory.  This laboratory is certified under the Clinical Laboratory Improvement Amendments CLIA as qualified to perform high complexity clinical laboratory testing.    TRIMETH /SULFA  Value in next row Sensitive      <=1 SENSITIVEThis is a modified FDA-approved test that has been validated and its performance characteristics determined by the reporting laboratory.  This laboratory is certified under the Clinical Laboratory Improvement Amendments CLIA as qualified to perform high complexity clinical laboratory testing.    AMPICILLIN/SULBACTAM Value in next row Sensitive       <=1 SENSITIVEThis is a modified FDA-approved test that has been validated and its performance characteristics determined by the reporting laboratory.  This laboratory is certified under the Clinical Laboratory Improvement Amendments CLIA as qualified to perform high complexity clinical laboratory testing.    PIP/TAZO Value in next row Sensitive      <=4 SENSITIVEThis is a modified FDA-approved test that has been validated and its performance characteristics determined by the reporting laboratory.  This laboratory is certified under the Clinical Laboratory Improvement Amendments CLIA as qualified to perform high complexity clinical laboratory testing.    MEROPENEM Value in next row Sensitive      <=4 SENSITIVEThis is a modified FDA-approved test that has been validated and its performance characteristics determined by the reporting laboratory.  This laboratory is certified under the Clinical Laboratory Improvement Amendments CLIA as qualified to perform high complexity clinical laboratory testing.    * >=100,000 COLONIES/mL ESCHERICHIA COLI         Radiology Studies: CT ABDOMEN PELVIS W CONTRAST Result Date: 07/27/2024 CLINICAL DATA:  Abdominal pain.  Possible pulmonary embolism. EXAM: CT ANGIOGRAPHY CHEST CT ABDOMEN AND PELVIS WITH CONTRAST TECHNIQUE: Multidetector CT imaging of the chest was performed using the standard protocol during bolus administration of intravenous contrast. Multiplanar CT image reconstructions and MIPs were obtained to evaluate the vascular anatomy. Multidetector CT imaging of the abdomen and pelvis was performed using the standard protocol during bolus administration of intravenous contrast. RADIATION DOSE REDUCTION: This exam was performed according to the departmental dose-optimization program which includes automated exposure control, adjustment of the mA and/or kV according to patient size and/or use of iterative reconstruction technique. CONTRAST:  59mL OMNIPAQUE  IOHEXOL   350 MG/ML SOLN COMPARISON:  CT abdomen/pelvis 07/19/2024 and CTA chest 03/16/2022 FINDINGS: CTA CHEST FINDINGS Cardiovascular: Mild cardiomegaly slightly worse. The thoracic aorta is normal in caliber and otherwise unremarkable. Pulmonary arterial system is adequately opacified demonstrates a linear embolus over the left main pulmonary artery with small embolus over a proximal lingular pulmonary artery. Small emboli are present over the proximal right upper, middle and lower lobar pulmonary arteries. RV/LV ratio = 52/41 compatible with right heart strain. Remaining vascular structures are unremarkable. Mediastinum/Nodes: No evidence of mediastinal or hilar adenopathy. Remaining mediastinal structures are unremarkable. Lungs/Pleura: Lungs are adequately inflated without acute airspace process or effusion. Airways are unremarkable. Musculoskeletal: No focal abnormality. Review of the MIP images confirms the above findings. CT ABDOMEN and PELVIS FINDINGS Hepatobiliary: Previous cholecystectomy. Air within the biliary tree and stable prominence of the common bile duct unchanged likely due to patient's post cholecystectomy state. No focal liver mass. Pancreas: Normal.  Spleen: Normal. Adrenals/Urinary Tract: Adrenal glands are normal. Kidneys are normal in size. No significant hydronephrosis or nephrolithiasis. Stable 1.5 cm left renal cyst. No further imaging follow-up is recommended. Ureters and bladder are normal. Stomach/Bowel: Stomach and small bowel are normal. Appendix is normal. Colon is decompressed from the a Paddock flexure distally. There is mild diverticulosis of the colon. Vascular/Lymphatic: Abdominal aorta is normal in caliber. Remaining vascular structures again notable for low-density within the left external iliac vein suspicious for thrombus. No adenopathy. Reproductive: Status post hysterectomy. No adnexal masses. Other: No free fluid or focal inflammatory change. Tiny ventral hernia over the  epigastric region containing only peritoneal fat. Evidence of previous ventral hernia repair in the midline of the supraumbilical region. Musculoskeletal: Stable grade 1 anterolisthesis of L3 on L4. No focal abnormalities. Review of the MIP images confirms the above findings. IMPRESSION: 1. Evidence of bilateral pulmonary emboli. Evidence of right heart strain. 2. Mild cardiomegaly slightly worse. 3. No acute findings in the abdomen/pelvis. 4. Mild colonic diverticulosis. 5. Stable low-density within the left external iliac vein suspicious for thrombus as described previously. Continue to recommend left lower extremity venous ultrasound for further evaluation if not done. 6. Stable grade 1 anterolisthesis of L3 on L4. Critical Value/emergent results were called by telephone at the time of interpretation on 07/27/2024 at 2:50 pm to provider ANDREW TEE , who verbally acknowledged these results. Electronically Signed   By: Toribio Agreste M.D.   On: 07/27/2024 14:50   CT Angio Chest PE W and/or Wo Contrast Result Date: 07/27/2024 CLINICAL DATA:  Abdominal pain.  Possible pulmonary embolism. EXAM: CT ANGIOGRAPHY CHEST CT ABDOMEN AND PELVIS WITH CONTRAST TECHNIQUE: Multidetector CT imaging of the chest was performed using the standard protocol during bolus administration of intravenous contrast. Multiplanar CT image reconstructions and MIPs were obtained to evaluate the vascular anatomy. Multidetector CT imaging of the abdomen and pelvis was performed using the standard protocol during bolus administration of intravenous contrast. RADIATION DOSE REDUCTION: This exam was performed according to the departmental dose-optimization program which includes automated exposure control, adjustment of the mA and/or kV according to patient size and/or use of iterative reconstruction technique. CONTRAST:  59mL OMNIPAQUE  IOHEXOL  350 MG/ML SOLN COMPARISON:  CT abdomen/pelvis 07/19/2024 and CTA chest 03/16/2022 FINDINGS: CTA CHEST  FINDINGS Cardiovascular: Mild cardiomegaly slightly worse. The thoracic aorta is normal in caliber and otherwise unremarkable. Pulmonary arterial system is adequately opacified demonstrates a linear embolus over the left main pulmonary artery with small embolus over a proximal lingular pulmonary artery. Small emboli are present over the proximal right upper, middle and lower lobar pulmonary arteries. RV/LV ratio = 52/41 compatible with right heart strain. Remaining vascular structures are unremarkable. Mediastinum/Nodes: No evidence of mediastinal or hilar adenopathy. Remaining mediastinal structures are unremarkable. Lungs/Pleura: Lungs are adequately inflated without acute airspace process or effusion. Airways are unremarkable. Musculoskeletal: No focal abnormality. Review of the MIP images confirms the above findings. CT ABDOMEN and PELVIS FINDINGS Hepatobiliary: Previous cholecystectomy. Air within the biliary tree and stable prominence of the common bile duct unchanged likely due to patient's post cholecystectomy state. No focal liver mass. Pancreas: Normal. Spleen: Normal. Adrenals/Urinary Tract: Adrenal glands are normal. Kidneys are normal in size. No significant hydronephrosis or nephrolithiasis. Stable 1.5 cm left renal cyst. No further imaging follow-up is recommended. Ureters and bladder are normal. Stomach/Bowel: Stomach and small bowel are normal. Appendix is normal. Colon is decompressed from the a Paddock flexure distally. There is mild diverticulosis of the colon. Vascular/Lymphatic:  Abdominal aorta is normal in caliber. Remaining vascular structures again notable for low-density within the left external iliac vein suspicious for thrombus. No adenopathy. Reproductive: Status post hysterectomy. No adnexal masses. Other: No free fluid or focal inflammatory change. Tiny ventral hernia over the epigastric region containing only peritoneal fat. Evidence of previous ventral hernia repair in the midline of  the supraumbilical region. Musculoskeletal: Stable grade 1 anterolisthesis of L3 on L4. No focal abnormalities. Review of the MIP images confirms the above findings. IMPRESSION: 1. Evidence of bilateral pulmonary emboli. Evidence of right heart strain. 2. Mild cardiomegaly slightly worse. 3. No acute findings in the abdomen/pelvis. 4. Mild colonic diverticulosis. 5. Stable low-density within the left external iliac vein suspicious for thrombus as described previously. Continue to recommend left lower extremity venous ultrasound for further evaluation if not done. 6. Stable grade 1 anterolisthesis of L3 on L4. Critical Value/emergent results were called by telephone at the time of interpretation on 07/27/2024 at 2:50 pm to provider ANDREW TEE , who verbally acknowledged these results. Electronically Signed   By: Toribio Agreste M.D.   On: 07/27/2024 14:50        Scheduled Meds:  allopurinol   300 mg Oral BID   DULoxetine   60 mg Oral Daily   insulin  aspart  0-15 Units Subcutaneous Q4H   pantoprazole   40 mg Oral Daily   potassium chloride   30 mEq Oral Q6H   potassium chloride   40 mEq Oral Once   rosuvastatin   40 mg Oral Daily   sodium chloride  flush  3 mL Intravenous Q12H   Continuous Infusions:  heparin  1,250 Units/hr (07/28/24 0508)   potassium chloride        LOS: 1 day       Brayton Lye, MD [facility]   To contact the attending provider between 7A-7P or the covering provider during after hours 7P-7A, please log into the web site [email or web address] and access using universal [place] password for that web site. If you do not have the password, please call the hospital operator.  07/28/2024, 9:15 AM

## 2024-07-28 NOTE — Progress Notes (Signed)
 ANTICOAGULATION CONSULT NOTE  Pharmacy Consult for Heparin  Indication: pulmonary embolus and DVT  Allergies  Allergen Reactions   Other Other (See Comments)    Some BANDAIDS cause skin irritation    Penicillins Itching     Tolerated Zosyn  couse 10/04/22    Patient Measurements: Weight: 87.3 kg (192 lb 7.4 oz) Heparin  Dosing Weight: 78.7 kg  Vital Signs: Temp: 98.3 F (36.8 C) (11/14 1222) Temp Source: Axillary (11/14 1222) BP: 122/64 (11/14 0740) Pulse Rate: 78 (11/14 1222)  Labs: Recent Labs    07/27/24 1250 07/27/24 1531 07/27/24 1553 07/27/24 1731 07/28/24 0352 07/28/24 1355  HGB 13.8  --   --   --  10.4*  --   HCT 42.8  --   --   --  32.8*  --   PLT 253  --   --   --  180  --   APTT  --   --  31  --  108* 70*  HEPARINUNFRC  --   --  >1.10*  --  >1.10*  --   CREATININE 0.88  --   --   --  0.85  --   TROPONINIHS  --  8  --  10  --   --     Estimated Creatinine Clearance: 66.6 mL/min (by C-G formula based on SCr of 0.85 mg/dL).   Medical History: Past Medical History:  Diagnosis Date   Acute pulmonary embolism (HCC) 01/14/2023   Arthritis    PAIN AND OA BOTH KNEES AND SHOULDERS AND ELBOWS AND WRIST   Blood transfusion without reported diagnosis 2018   after cholecystectomy   Broken foot, right, closed, initial encounter 09/15/2021   no surgery needed, fell at home and went to ED   Choledocholithiasis with obstruction 12/07/2016   Diabetes mellitus    ORAL MEDICATION   Diverticulitis    GERD (gastroesophageal reflux disease)    Gout    NO RECENT FLARE UPS   H/O: rheumatic fever    AS A CHILD - NO KNOWN HEART MURMUR OR HEART PROBLEMS   Heart attack (HCC)    History of rheumatic fever 06/30/2017   Hyperlipidemia    Hypertension    PFO (patent foramen ovale)    Stroke (HCC) 03/2017    Medications:  Medications Prior to Admission  Medication Sig Dispense Refill Last Dose/Taking   acetaminophen  (TYLENOL ) 500 MG tablet Take 1,000 mg by mouth every  6 (six) hours as needed for mild pain or moderate pain.   07/27/2024 Morning   allopurinol  (ZYLOPRIM ) 300 MG tablet TAKE 1 TABLET BY MOUTH TWICE A DAY 180 tablet 1 07/27/2024 Morning   apixaban  (ELIQUIS ) 5 MG TABS tablet Take 5 mg by mouth 2 (two) times daily.   07/27/2024 at  8:30 AM   DULoxetine  (CYMBALTA ) 60 MG capsule Take 1 capsule (60 mg total) by mouth daily. 90 capsule 3 07/27/2024 Morning   fenofibrate  160 MG tablet Take 1 tablet (160 mg total) by mouth daily. 90 tablet 3 07/27/2024 Morning   folic acid  (FOLVITE ) 1 MG tablet Take 1 mg by mouth daily.   07/27/2024 Morning   gabapentin  (NEURONTIN ) 300 MG capsule TAKE 1 CAPSULE BY MOUTH EVERYDAY AT BEDTIME 30 capsule 1 07/26/2024   isosorbide  mononitrate (IMDUR ) 30 MG 24 hr tablet TAKE 1 TABLET BY MOUTH EVERY DAY 90 tablet 3 07/27/2024 Morning   olmesartan  (BENICAR ) 5 MG tablet Take 1 tablet (5 mg total) by mouth daily. 90 tablet 3 07/27/2024 Morning   pantoprazole  (  PROTONIX ) 40 MG tablet TAKE 1 TABLET BY MOUTH EVERY DAY 90 tablet 1 07/27/2024 Morning   potassium chloride  SA (KLOR-CON  M) 20 MEQ tablet Take 40 mEq by mouth daily.   07/27/2024 Morning   ACCU-CHEK AVIVA PLUS test strip USE AS INSTRUCTED ONCE A DAY. DX CODE E11.9 100 strip 1    Accu-Chek Softclix Lancets lancets Use as instructed once a day.  DX Code: E11.9 100 each 1    QUEtiapine (SEROQUEL) 50 MG tablet Take 1 tablet (50 mg total) by mouth at bedtime. (Patient not taking: Reported on 07/27/2024) 30 tablet 2 Not Taking   rosuvastatin  (CRESTOR ) 40 MG tablet TAKE 1 TABLET BY MOUTH EVERY DAY (Patient not taking: Reported on 07/27/2024) 90 tablet 2 Not Taking   Scheduled:   allopurinol   300 mg Oral BID   cyanocobalamin   1,000 mcg Subcutaneous Daily   Followed by   NOREEN ON 08/04/2024] cyanocobalamin   1,000 mcg Subcutaneous Q30 days   DULoxetine   60 mg Oral Daily   folic acid   1 mg Oral Daily   insulin  aspart  0-15 Units Subcutaneous Q4H   pantoprazole   40 mg Oral Daily    potassium chloride   30 mEq Oral Q6H   potassium chloride   40 mEq Oral Once   rosuvastatin   40 mg Oral Daily   sodium chloride  flush  3 mL Intravenous Q12H   Infusions:   heparin  1,250 Units/hr (07/28/24 0508)   potassium chloride  10 mEq (07/28/24 1357)   PRN: acetaminophen  **OR** acetaminophen , HYDROcodone -acetaminophen , morphine  injection  Assessment: 16f yof with a history of HLD, recent DVT. Patient is presenting with abdominal pain. Heparin  per pharmacy consult placed for pulmonary embolus and DVT.  CTA PE w/ bilateral PE w/ RHS  Patient recently started on apixaban  (11/5) for DVT diagnosis. No PE study at that time. Last dose 11/13 ~8-9am per patient's spouse. Will require aPTT monitoring due to likely falsely high anti-Xa level secondary to DOAC use.   PTT came back therapeutic this PM.   Goal of Therapy:  Heparin  level 0.3-0.7 units/ml aPTT 66-102 seconds Monitor platelets by anticoagulation protocol: Yes   Plan:  Cont heparin  1250 units/hr Check aPTT in 6 hr and daily while on heparin  Continue to monitor via aPTT until levels are correlated Continue to monitor H&H and platelets  Sergio Batch, PharmD, BCIDP, AAHIVP, CPP Infectious Disease Pharmacist 07/28/2024 2:50 PM

## 2024-07-28 NOTE — Progress Notes (Signed)
-  Venous duplex lower ext  has been completed. Refer to [facility] under chart review to view preliminary results.   07/28/2024  9:28 AM Skylar Flynt, Ricka BIRCH

## 2024-07-28 NOTE — Care Management (Signed)
 Transition of Care [facility]) - Inpatient Brief Assessment   Patient Details  Name: ARRIA NAIM MRN: [id number] Date of Birth: [birth year]  [facility]) CM/SW Contact:    Corean JAYSON Canary, RN Phone Number: 07/28/2024, 9:04 AM   Clinical Narrative:  Patient presented with PE, was in the ED on 11/5 with DVT.  Returned home at that time declined [facility], her husband stated they were working that out with PCP. RNCM gave them resources for personal care aides at that time as well. IP care management will monitor for needs  Transition of Care Asessment: Insurance and Status: Insurance coverage has been reviewed Patient has primary care physician: Yes Home environment has been reviewed: Lives with spouse Prior level of function:: Uses walker Prior/Current Home Services: Current home services Social Drivers of Health Review: SDOH reviewed no interventions necessary   Transition of care needs: transition of care needs identified, TOC will continue to follow

## 2024-07-29 DIAGNOSIS — I2602 Saddle embolus of pulmonary artery with acute cor pulmonale: Secondary | ICD-10-CM | POA: Diagnosis not present

## 2024-07-29 LAB — CBC
HCT: 34 % — ABNORMAL LOW (ref 36.0–46.0)
Hemoglobin: 10.5 g/dL — ABNORMAL LOW (ref 12.0–15.0)
MCH: 31.9 pg (ref 26.0–34.0)
MCHC: 30.9 g/dL (ref 30.0–36.0)
MCV: 103.3 fL — ABNORMAL HIGH (ref 80.0–100.0)
Platelets: 172 K/uL (ref 150–400)
RBC: 3.29 MIL/uL — ABNORMAL LOW (ref 3.87–5.11)
RDW: 16.5 % — ABNORMAL HIGH (ref 11.5–15.5)
WBC: 5.9 K/uL (ref 4.0–10.5)
nRBC: 0 % (ref 0.0–0.2)

## 2024-07-29 LAB — PHOSPHORUS: Phosphorus: 2.6 mg/dL (ref 2.5–4.6)

## 2024-07-29 LAB — BASIC METABOLIC PANEL WITH GFR
Anion gap: 8 (ref 5–15)
BUN: 9 mg/dL (ref 8–23)
CO2: 23 mmol/L (ref 22–32)
Calcium: 8.6 mg/dL — ABNORMAL LOW (ref 8.9–10.3)
Chloride: 110 mmol/L (ref 98–111)
Creatinine, Ser: 0.75 mg/dL (ref 0.44–1.00)
GFR, Estimated: >60 mL/min (ref >60)
Glucose, Bld: 83 mg/dL (ref 70–99)
Potassium: 3.4 mmol/L — ABNORMAL LOW (ref 3.5–5.1)
Sodium: 141 mmol/L (ref 135–145)

## 2024-07-29 LAB — GLUCOSE, CAPILLARY
Glucose-Capillary: 100 mg/dL — ABNORMAL HIGH (ref 70–99)
Glucose-Capillary: 114 mg/dL — ABNORMAL HIGH (ref 70–99)
Glucose-Capillary: 126 mg/dL — ABNORMAL HIGH (ref 70–99)
Glucose-Capillary: 72 mg/dL (ref 70–99)
Glucose-Capillary: 79 mg/dL (ref 70–99)
Glucose-Capillary: 87 mg/dL (ref 70–99)
Glucose-Capillary: 89 mg/dL (ref 70–99)

## 2024-07-29 LAB — APTT
aPTT: 134 s — ABNORMAL HIGH (ref 24–36)
aPTT: 153 s — ABNORMAL HIGH (ref 24–36)

## 2024-07-29 LAB — HEPARIN LEVEL (UNFRACTIONATED): Heparin Unfractionated: >1.1 [IU]/mL — ABNORMAL HIGH (ref 0.30–0.70)

## 2024-07-29 LAB — MAGNESIUM: Magnesium: 1.2 mg/dL — ABNORMAL LOW (ref 1.7–2.4)

## 2024-07-29 MED ORDER — MAGNESIUM SULFATE 4 GM/100ML IV SOLN
4.0000 g | Freq: Once | INTRAVENOUS | Status: AC
  Administered 2024-07-29: 4 g via INTRAVENOUS
  Filled 2024-07-29: qty 100

## 2024-07-29 MED ORDER — HYDROCODONE-ACETAMINOPHEN 5-325 MG PO TABS
1.0000 | ORAL_TABLET | Freq: Four times a day (QID) | ORAL | Status: DC | PRN
  Administered 2024-07-31 – 2024-08-02 (×4): 1 via ORAL
  Filled 2024-07-29 (×4): qty 1

## 2024-07-29 MED ORDER — ACETAMINOPHEN 650 MG RE SUPP: 650.0000 mg | Freq: Four times a day (QID) | RECTAL | Status: DC | PRN

## 2024-07-29 MED ORDER — ACETAMINOPHEN 325 MG PO TABS
325.0000 mg | ORAL_TABLET | Freq: Four times a day (QID) | ORAL | Status: DC | PRN
  Administered 2024-07-29 – 2024-08-06 (×7): 325 mg via ORAL
  Filled 2024-07-29 (×7): qty 1

## 2024-07-29 MED ORDER — POTASSIUM CHLORIDE CRYS ER 20 MEQ PO TBCR
40.0000 meq | EXTENDED_RELEASE_TABLET | Freq: Once | ORAL | Status: AC
  Administered 2024-07-29: 40 meq via ORAL
  Filled 2024-07-29: qty 2

## 2024-07-29 MED ORDER — DULOXETINE HCL 30 MG PO CPEP
30.0000 mg | ORAL_CAPSULE | Freq: Every day | ORAL | Status: DC
  Administered 2024-07-30: 30 mg via ORAL
  Filled 2024-07-29: qty 1

## 2024-07-29 MED ORDER — MIRTAZAPINE 15 MG PO TBDP
15.0000 mg | ORAL_TABLET | Freq: Every day | ORAL | Status: DC
  Administered 2024-07-29: 15 mg via ORAL
  Filled 2024-07-29: qty 1

## 2024-07-29 MED ORDER — OYSTER SHELL CALCIUM/D3 500-5 MG-MCG PO TABS
1.0000 | ORAL_TABLET | Freq: Two times a day (BID) | ORAL | Status: DC
  Administered 2024-07-29 – 2024-08-07 (×19): 1 via ORAL
  Filled 2024-07-29 (×19): qty 1

## 2024-07-29 MED ORDER — APIXABAN 5 MG PO TABS
5.0000 mg | ORAL_TABLET | Freq: Two times a day (BID) | ORAL | Status: DC
  Administered 2024-07-29 – 2024-08-07 (×19): 5 mg via ORAL
  Filled 2024-07-29 (×19): qty 1

## 2024-07-29 MED ORDER — POLYETHYLENE GLYCOL 3350 17 G PO PACK
17.0000 g | PACK | Freq: Every day | ORAL | Status: DC | PRN
  Administered 2024-07-29: 17 g via ORAL
  Filled 2024-07-29: qty 1

## 2024-07-29 NOTE — Progress Notes (Signed)
 ANTICOAGULATION CONSULT NOTE  Pharmacy Consult for Heparin  > apixaban  Indication: pulmonary embolus and DVT  Allergies  Allergen Reactions   Other Other (See Comments)    Some BANDAIDS cause skin irritation    Penicillins Itching     Tolerated Zosyn  couse 10/04/22    Patient Measurements: Weight: 98.1 kg (216 lb 4.3 oz) Heparin  Dosing Weight: 78.7 kg  Vital Signs: Temp: 98.1 F (36.7 C) (11/15 0756) Temp Source: Oral (11/15 0756) BP: 124/60 (11/15 0756) Pulse Rate: 68 (11/15 0756)  Labs: Recent Labs    07/27/24 1250 07/27/24 1531 07/27/24 1553 07/27/24 1731 07/28/24 0352 07/28/24 1355 07/28/24 2044 07/29/24 0227 07/29/24 0605  HGB 13.8  --   --   --  10.4*  --   --  10.5*  --   HCT 42.8  --   --   --  32.8*  --   --  34.0*  --   PLT 253  --   --   --  180  --   --  172  --   APTT  --   --    < >  --  108* 70* 102* 134* 153*  HEPARINUNFRC  --   --    < >  --  >1.10* >1.10*  --  >1.10*  --   CREATININE 0.88  --   --   --  0.85  --   --   --  0.75  TROPONINIHS  --  8  --  10  --   --   --   --   --    < > = values in this interval not displayed.    Estimated Creatinine Clearance: 75.1 mL/min (by C-G formula based on SCr of 0.75 mg/dL).   Medical History: Past Medical History:  Diagnosis Date   Acute pulmonary embolism (HCC) 01/14/2023   Arthritis    PAIN AND OA BOTH KNEES AND SHOULDERS AND ELBOWS AND WRIST   Blood transfusion without reported diagnosis 2018   after cholecystectomy   Broken foot, right, closed, initial encounter 09/15/2021   no surgery needed, fell at home and went to ED   Choledocholithiasis with obstruction 12/07/2016   Diabetes mellitus    ORAL MEDICATION   Diverticulitis    GERD (gastroesophageal reflux disease)    Gout    NO RECENT FLARE UPS   H/O: rheumatic fever    AS A CHILD - NO KNOWN HEART MURMUR OR HEART PROBLEMS   Heart attack (HCC)    History of rheumatic fever 06/30/2017   Hyperlipidemia    Hypertension    PFO (patent  foramen ovale)    Stroke (HCC) 03/2017    Medications:  Medications Prior to Admission  Medication Sig Dispense Refill Last Dose/Taking   acetaminophen  (TYLENOL ) 500 MG tablet Take 1,000 mg by mouth every 6 (six) hours as needed for mild pain or moderate pain.   07/27/2024 Morning   allopurinol  (ZYLOPRIM ) 300 MG tablet TAKE 1 TABLET BY MOUTH TWICE A DAY 180 tablet 1 07/27/2024 Morning   apixaban  (ELIQUIS ) 5 MG TABS tablet Take 5 mg by mouth 2 (two) times daily.   07/27/2024 at  8:30 AM   DULoxetine  (CYMBALTA ) 60 MG capsule Take 1 capsule (60 mg total) by mouth daily. 90 capsule 3 07/27/2024 Morning   fenofibrate  160 MG tablet Take 1 tablet (160 mg total) by mouth daily. 90 tablet 3 07/27/2024 Morning   folic acid  (FOLVITE ) 1 MG tablet Take 1 mg by mouth  daily.   07/27/2024 Morning   gabapentin  (NEURONTIN ) 300 MG capsule TAKE 1 CAPSULE BY MOUTH EVERYDAY AT BEDTIME 30 capsule 1 07/26/2024   isosorbide  mononitrate (IMDUR ) 30 MG 24 hr tablet TAKE 1 TABLET BY MOUTH EVERY DAY 90 tablet 3 07/27/2024 Morning   olmesartan  (BENICAR ) 5 MG tablet Take 1 tablet (5 mg total) by mouth daily. 90 tablet 3 07/27/2024 Morning   pantoprazole  (PROTONIX ) 40 MG tablet TAKE 1 TABLET BY MOUTH EVERY DAY 90 tablet 1 07/27/2024 Morning   potassium chloride  SA (KLOR-CON  M) 20 MEQ tablet Take 40 mEq by mouth daily.   07/27/2024 Morning   ACCU-CHEK AVIVA PLUS test strip USE AS INSTRUCTED ONCE A DAY. DX CODE E11.9 100 strip 1    Accu-Chek Softclix Lancets lancets Use as instructed once a day.  DX Code: E11.9 100 each 1    QUEtiapine (SEROQUEL) 50 MG tablet Take 1 tablet (50 mg total) by mouth at bedtime. (Patient not taking: Reported on 07/27/2024) 30 tablet 2 Not Taking   rosuvastatin  (CRESTOR ) 40 MG tablet TAKE 1 TABLET BY MOUTH EVERY DAY (Patient not taking: Reported on 07/27/2024) 90 tablet 2 Not Taking   Scheduled:   allopurinol   300 mg Oral BID   calcium -vitamin D   1 tablet Oral BID   cyanocobalamin   1,000 mcg  Subcutaneous Daily   Followed by   NOREEN ON 08/04/2024] cyanocobalamin   1,000 mcg Subcutaneous Q30 days   [START ON 07/30/2024] DULoxetine   30 mg Oral Daily   folic acid   1 mg Oral Daily   insulin  aspart  0-15 Units Subcutaneous Q4H   mirtazapine  15 mg Oral QHS   pantoprazole   40 mg Oral Daily   potassium chloride   40 mEq Oral Once   rosuvastatin   40 mg Oral Daily   sodium chloride  flush  3 mL Intravenous Q12H   Infusions:   heparin  1,000 Units/hr (07/29/24 0529)   magnesium  sulfate bolus IVPB 4 g (07/29/24 0914)   PRN: acetaminophen  **OR** acetaminophen , HYDROcodone -acetaminophen   Assessment: 89f yof with a history of HLD, recent DVT. Patient is presenting with abdominal pain. Heparin  per pharmacy consult placed for pulmonary embolus with RHS and DVT. Now transitioning back to apixaban .   Patient recently started on apixaban  (11/5) for DVT diagnosis. No PE study at that time. Confirmed with patient and spouse she took the full 7-day loading dose 11/5-11/11 and started the maintenance dose on 11/12. Last dose 11/13 ~8-9am per patient's spouse.  Hgb, PLT WNL. No bleeding reported.   Goal of Therapy:  Heparin  level 0.3-0.7 units/ml aPTT 66-102 seconds Monitor platelets by anticoagulation protocol: Yes   Plan:  Discontinue heparin  infusion Restart apixaban  5 mg PO BID Continue to monitor H&H, platelets and s/sx bleeding  Izetta Carl, PharmD PGY1 Pharmacy Resident

## 2024-07-29 NOTE — Progress Notes (Signed)
 ANTICOAGULATION CONSULT NOTE Pharmacy Consult for Heparin  Indication: pulmonary embolus and DVT Brief A/P: aPTT supratherapeutic Decrease Heparin  rate  Allergies  Allergen Reactions   Other Other (See Comments)    Some BANDAIDS cause skin irritation    Penicillins Itching     Tolerated Zosyn  couse 10/04/22    Patient Measurements: Weight: 98.1 kg (216 lb 4.3 oz) Heparin  Dosing Weight: 78.7 kg  Vital Signs: BP: 123/72 (11/15 0000) Pulse Rate: 80 (11/15 0021)  Labs: Recent Labs    07/27/24 1250 07/27/24 1531 07/27/24 1553 07/27/24 1731 07/28/24 0352 07/28/24 1355 07/28/24 2044 07/29/24 0227  HGB 13.8  --   --   --  10.4*  --   --  10.5*  HCT 42.8  --   --   --  32.8*  --   --  34.0*  PLT 253  --   --   --  180  --   --  172  APTT  --   --    < >  --  108* 70* 102* 134*  HEPARINUNFRC  --   --    < >  --  >1.10* >1.10*  --  >1.10*  CREATININE 0.88  --   --   --  0.85  --   --   --   TROPONINIHS  --  8  --  10  --   --   --   --    < > = values in this interval not displayed.    Estimated Creatinine Clearance: 70.6 mL/min (by C-G formula based on SCr of 0.85 mg/dL).   Assessment: 73 y.o. female with PE/DVT, Eliquis  on hold, for heparin   Goal of Therapy:  Heparin  level 0.3-0.7 units/ml aPTT 66-102 seconds Monitor platelets by anticoagulation protocol: Yes   Plan:  Decrease Heparin  1000 units/hr Check aPTT in 8 hours  Cathlyn Arrant, PharmD, BCPS

## 2024-07-29 NOTE — Plan of Care (Signed)
  Problem: Tissue Perfusion: Goal: Adequacy of tissue perfusion will improve Outcome: Progressing   Problem: Health Behavior/Discharge Planning: Goal: Ability to manage health-related needs will improve Outcome: Progressing   Problem: Clinical Measurements: Goal: Diagnostic test results will improve Outcome: Progressing Goal: Cardiovascular complication will be avoided Outcome: Progressing

## 2024-07-29 NOTE — Plan of Care (Signed)

## 2024-07-29 NOTE — Progress Notes (Signed)
 Progress note   Rachel Black  FMW:[id number] DOB: Feb 22, 1951 DOA: 07/27/2024 PCP: Chandra Toribio [name], MD   No chief complaint on file.   Brief Narrative:   Rachel Black is a 73 y.o. female with past medical history  of  hypertension, hyperlipidemia, CVA, DM 2, gout, chronic abdominal pain, IBS, GERD, PFO, She underwent ORIF without internal fixation of posterior malleolus and left ankle syndesmosis by Dr. Barton on 7/9, husband at bedside assists with the history, apparently she was at the SNF facility until 07/15/2024, unfortunately she was still nonambulatory,  She was seen recently in ed and found to have Left iliofemoral and distal DVT  and offered thrombectomy by vascular and pt declined on 07/19/2024. Pt was d/c with eliquis , she presents to ED secondary to generalized pain, CTA chest has been obtained, which was significant for bilateral PE with right heart strain, so she is admitted for further workup.  Assessment & Plan:   Active Problems:   Acute pulmonary embolism (HCC)  Bilateral PE with right heart strain Acute DVT in left lower extremity. - Patient has been on Eliquis  for 7 days, her workup in ED significant for bilateral PE with right heart strain. - 2D echo with preserved EF, no evidence of right heart strain, but mildly reduced ventricle systolic function with no enlargement  - Repeat venous Dopplers showing no extension or worsening of his acute left DVT . - He is on heparin  drip, given reassuring echo and Dopplers will transition back to Eliquis  (would not consider this Eliquis  failure as it was just started in outpatient setting)  - Discussed with patient that now she can work with PT/OT .  Hypertension - Blood pressure on admission, home medication including Imdur  and olmesartan  on hold, blood pressure has improved today, will resume if continues to increase .   Anemia B12 deficiency Folic acid  deficiency -Recent workup significant for B12 and folic  acid, will continue with supplements.  Deconditioning Status post ORIF 03/22/2024 for left trimalleolar fracture secondary to fall Status post ORIF 7/9, by Dr. Barton.   - Discussed with [facility] PA Ollice, she is currently weightbearing as tolerated  - Continue with calcium  and vitamin D .    Mixed hyperlipidemia  Continue with fenofibrate    GERD Continue Protonix     Hx Gout Continue allopurinol    Obesity, class I Body mass index is 34.91 kg/m.  Hypokalemia - Low at 3.4 today, will replace  Severe hypomagnesemia - Significantly low at 1.2, will replace   Depression - Patient appears to be in depressed mood, discussed with her, her husband at bedside, she is on Cymbalta , will decrease Cymbalta  dose, and I will start on low-dose mirtazapine and uptitrate to improve her mood, sleep and appetite    DVT prophylaxis: (Heparin  drip) Code Status: (Full) Family Communication: (Discussed with husband at bedside) Disposition:   Status is: Inpatient    Consultants:  None   Subjective:  She denies any chest pain or shortness of breath  Objective: Vitals:   07/29/24 0400 07/29/24 0456 07/29/24 0500 07/29/24 0756  BP: 116/66   124/60  Pulse: 71  73 68  Resp: 17   18  Temp: 98.6 F (37 C)   98.1 F (36.7 C)  TempSrc: Oral   Oral  SpO2: 93%  94% 98%  Weight:  98.1 kg     No intake or output data in the 24 hours ending 07/29/24 0956  Filed Weights   07/28/24 0500 07/29/24 0456  Weight: 87.3 kg 98.1 kg    Examination:  Awake Alert, Oriented X 3, No new F.N deficits, flat affect Symmetrical Chest wall movement, Good air movement bilaterally, CTAB RRR,No Gallops,Rubs or new Murmurs, No Parasternal Heave +ve B.Sounds, Abd Soft, No tenderness, No rebound - guarding or rigidity. No Cyanosis, Clubbing or edema, No new Rash or bruise       Data Reviewed: I have personally reviewed following labs and imaging studies  CBC: Recent Labs  Lab 07/27/24 1250  07/28/24 0352 07/29/24 0227  WBC 7.7 6.5 5.9  HGB 13.8 10.4* 10.5*  HCT 42.8 32.8* 34.0*  MCV 99.3 99.1 103.3*  PLT 253 180 172    Basic Metabolic Panel: Recent Labs  Lab 07/27/24 1250 07/28/24 0352 07/29/24 0605  NA 142 142 141  K 3.9 2.9* 3.4*  CL 107 108 110  CO2 21* 23 23  GLUCOSE 108* 84 83  BUN 12 10 9   CREATININE 0.88 0.85 0.75  CALCIUM  9.7 8.9 8.6*  MG  --   --  1.2*  PHOS  --   --  2.6    GFR: Estimated Creatinine Clearance: 75.1 mL/min (by C-G formula based on SCr of 0.75 mg/dL).  Liver Function Tests: Recent Labs  Lab 07/27/24 1250 07/28/24 0352  AST 21 14*  ALT 12 9  ALKPHOS 63 48  BILITOT 1.0 0.8  PROT 5.9* 4.3*  ALBUMIN  3.1* 2.2*    CBG: Recent Labs  Lab 07/28/24 1556 07/28/24 2037 07/29/24 0029 07/29/24 0433 07/29/24 0755  GLUCAP 101* 96 79 72 89     Recent Results (from the past 240 hours)  Urine Culture     Status: Abnormal   Collection Time: 07/19/24 10:01 AM   Specimen: Urine, Clean Catch  Result Value Ref Range Status   Specimen Description URINE, CLEAN CATCH  Final   Special Requests   Final    NONE Performed at [facility], [address] [place], [place] [address]    Culture   Final    Two isolates with different morphologies were identified as the same organism.The most resistant organism was reported. >=100,000 COLONIES/mL ESCHERICHIA COLI    Report Status 07/22/2024 FINAL  Final   Organism ID, Bacteria ESCHERICHIA COLI (A)  Final      Susceptibility   Escherichia coli - MIC*    AMPICILLIN 16 INTERMEDIATE Intermediate     CEFAZOLIN  (URINE) Value in next row Sensitive      <=1 SENSITIVEThis is a modified FDA-approved test that has been validated and its performance characteristics determined by the reporting laboratory.  This laboratory is certified under the Clinical Laboratory Improvement Amendments CLIA as qualified to perform high complexity clinical laboratory testing.    CEFEPIME Value in next row  Sensitive      <=1 SENSITIVEThis is a modified FDA-approved test that has been validated and its performance characteristics determined by the reporting laboratory.  This laboratory is certified under the Clinical Laboratory Improvement Amendments CLIA as qualified to perform high complexity clinical laboratory testing.    ERTAPENEM Value in next row Sensitive      <=1 SENSITIVEThis is a modified FDA-approved test that has been validated and its performance characteristics determined by the reporting laboratory.  This laboratory is certified under the Clinical Laboratory Improvement Amendments CLIA as qualified to perform high complexity clinical laboratory testing.    CEFTRIAXONE  Value in next row Sensitive      <=1 SENSITIVEThis is a modified FDA-approved test that has been  validated and its performance characteristics determined by the reporting laboratory.  This laboratory is certified under the Clinical Laboratory Improvement Amendments CLIA as qualified to perform high complexity clinical laboratory testing.    CIPROFLOXACIN  Value in next row Sensitive      <=1 SENSITIVEThis is a modified FDA-approved test that has been validated and its performance characteristics determined by the reporting laboratory.  This laboratory is certified under the Clinical Laboratory Improvement Amendments CLIA as qualified to perform high complexity clinical laboratory testing.    GENTAMICIN Value in next row Sensitive      <=1 SENSITIVEThis is a modified FDA-approved test that has been validated and its performance characteristics determined by the reporting laboratory.  This laboratory is certified under the Clinical Laboratory Improvement Amendments CLIA as qualified to perform high complexity clinical laboratory testing.    NITROFURANTOIN Value in next row Sensitive      <=1 SENSITIVEThis is a modified FDA-approved test that has been validated and its performance characteristics determined by the reporting  laboratory.  This laboratory is certified under the Clinical Laboratory Improvement Amendments CLIA as qualified to perform high complexity clinical laboratory testing.    TRIMETH /SULFA  Value in next row Sensitive      <=1 SENSITIVEThis is a modified FDA-approved test that has been validated and its performance characteristics determined by the reporting laboratory.  This laboratory is certified under the Clinical Laboratory Improvement Amendments CLIA as qualified to perform high complexity clinical laboratory testing.    AMPICILLIN/SULBACTAM Value in next row Sensitive      <=1 SENSITIVEThis is a modified FDA-approved test that has been validated and its performance characteristics determined by the reporting laboratory.  This laboratory is certified under the Clinical Laboratory Improvement Amendments CLIA as qualified to perform high complexity clinical laboratory testing.    PIP/TAZO Value in next row Sensitive      <=4 SENSITIVEThis is a modified FDA-approved test that has been validated and its performance characteristics determined by the reporting laboratory.  This laboratory is certified under the Clinical Laboratory Improvement Amendments CLIA as qualified to perform high complexity clinical laboratory testing.    MEROPENEM Value in next row Sensitive      <=4 SENSITIVEThis is a modified FDA-approved test that has been validated and its performance characteristics determined by the reporting laboratory.  This laboratory is certified under the Clinical Laboratory Improvement Amendments CLIA as qualified to perform high complexity clinical laboratory testing.    * >=100,000 COLONIES/mL ESCHERICHIA COLI         Radiology Studies: ECHOCARDIOGRAM COMPLETE Result Date: 07/28/2024    ECHOCARDIOGRAM REPORT   Patient Name:   Rachel Black Date of Exam: 07/28/2024 Medical Rec #:  [id number]       Height:       66.0 in Accession #:    [id number]      Weight:       192.5 lb Date of Birth:   Jan 04, 1951       BSA:          1.967 m Patient Age:    73 years        BP:           122/64 mmHg Patient Gender: F               HR:           86 bpm. Exam Location:  Inpatient Procedure: 2D Echo, Color Doppler and Cardiac Doppler (Both Spectral and Color  Flow Doppler were utilized during procedure). Indications:    Pulmonary Embolus  History:        Patient has prior history of Echocardiogram examinations, most                 recent 10/17/2017. Risk Factors:Diabetes, Dyslipidemia and                 Hypertension.  Sonographer:    Tinnie Gosling RDCS Referring Phys: [phone] EKTA V PATEL IMPRESSIONS  1. Left ventricular ejection fraction, by estimation, is 50 to 55%. The left ventricle has low normal function. The left ventricle demonstrates regional wall motion abnormalities (see scoring diagram/findings for description). Indeterminate diastolic filling due to E-A fusion.  2. Right ventricular systolic function is low normal. The right ventricular size is normal.  3. The mitral valve is normal in structure. Trivial mitral valve regurgitation. No evidence of mitral stenosis.  4. The aortic valve is normal in structure. Aortic valve regurgitation is not visualized. No aortic stenosis is present.  5. The inferior vena cava is normal in size with greater than 50% respiratory variability, suggesting right atrial pressure of 3 mmHg. Comparison(s): A prior study was performed on 10/17/2017. Prior images unable to be directly viewed, comparison made by report only. EF 55-60% without regional wall motion abnormalities. Grade I diastolic dysfunction. FINDINGS  Left Ventricle: Left ventricular ejection fraction, by estimation, is 50 to 55%. The left ventricle has low normal function. The left ventricle demonstrates regional wall motion abnormalities. The left ventricular internal cavity size was normal in size. There is no left ventricular hypertrophy. Indeterminate diastolic filling due to E-A fusion.  LV Wall  Scoring: The entire inferior wall is hypokinetic. Right Ventricle: The right ventricular size is normal. No increase in right ventricular wall thickness. Right ventricular systolic function is low normal. Left Atrium: Left atrial size was normal in size. Right Atrium: Right atrial size was normal in size. Pericardium: There is no evidence of pericardial effusion. Mitral Valve: The mitral valve is normal in structure. Trivial mitral valve regurgitation. No evidence of mitral valve stenosis. Tricuspid Valve: The tricuspid valve is normal in structure. Tricuspid valve regurgitation is trivial. No evidence of tricuspid stenosis. Aortic Valve: The aortic valve is normal in structure. Aortic valve regurgitation is not visualized. No aortic stenosis is present. Pulmonic Valve: The pulmonic valve was normal in structure. Pulmonic valve regurgitation is not visualized. No evidence of pulmonic stenosis. Aorta: The aortic root and ascending aorta are structurally normal, with no evidence of dilitation. Venous: The inferior vena cava is normal in size with greater than 50% respiratory variability, suggesting right atrial pressure of 3 mmHg. IAS/Shunts: No atrial level shunt detected by color flow Doppler.  LEFT VENTRICLE PLAX 2D LVIDd:         4.60 cm   Diastology LVIDs:         3.60 cm   LV e' medial:    6.42 cm/s LV PW:         1.10 cm   LV E/e' medial:  10.4 LV IVS:        1.00 cm   LV e' lateral:   12.60 cm/s LVOT diam:     2.40 cm   LV E/e' lateral: 5.3 LV SV:         82 LV SV Index:   42 LVOT Area:     4.52 cm LV IVRT:       135 msec  RIGHT VENTRICLE  IVC RV S prime:     18.70 cm/s  IVC diam: 1.50 cm TAPSE (M-mode): 1.7 cm LEFT ATRIUM             Index        RIGHT ATRIUM          Index LA diam:        3.80 cm 1.93 cm/m   RA Area:     9.51 cm LA Vol (A2C):   46.1 ml 23.43 ml/m  RA Volume:   15.60 ml 7.93 ml/m LA Vol (A4C):   41.4 ml 21.04 ml/m LA Biplane Vol: 46.7 ml 23.74 ml/m  AORTIC VALVE LVOT Vmax:    107.00 cm/s LVOT Vmean:  73.300 cm/s LVOT VTI:    0.181 m  AORTA Ao Root diam: 3.60 cm Ao Asc diam:  3.40 cm MITRAL VALVE MV Area (PHT): 4.60 cm     SHUNTS MV Decel Time: 165 msec     Systemic VTI:  0.18 m MV E velocity: 66.90 cm/s   Systemic Diam: 2.40 cm MV A velocity: 127.00 cm/s MV E/A ratio:  0.53 Emeline Calender Electronically signed by Emeline Calender Signature Date/Time: 07/28/2024/4:48:47 PM    Final    VAS US  LOWER EXTREMITY VENOUS (DVT) Result Date: 07/28/2024  Lower Venous DVT Study Patient Name:  Rachel Black  Date of Exam:   07/28/2024 Medical Rec #: [id number]        Accession #:    [id number] Date of Birth: 1950/11/21        Patient Gender: F Patient Age:   73 years Exam Location:  [facility] Procedure:      VAS US  LOWER EXTREMITY VENOUS (DVT) Referring Phys: BRAYTON LYE --------------------------------------------------------------------------------  Indications: Swelling, and History of left leg DVT on 07/19/24. Other Indications: Trauma LLE ankle fracture s/p surgical repair on 03/22/2024. Performing Technologist: Ricka Sturdivant-Jones RDMS, RVT  Examination Guidelines: A complete evaluation includes B-mode imaging, spectral Doppler, color Doppler, and power Doppler as needed of all accessible portions of each vessel. Bilateral testing is considered an integral part of a complete examination. Limited examinations for reoccurring indications may be performed as noted. The reflux portion of the exam is performed with the patient in reverse Trendelenburg.  +---------+---------------+---------+-----------+----------+-----------------+ RIGHT    CompressibilityPhasicitySpontaneityPropertiesThrombus Aging    +---------+---------------+---------+-----------+----------+-----------------+ CFV      Full           Yes      Yes                                    +---------+---------------+---------+-----------+----------+-----------------+ SFJ      Full                                                            +---------+---------------+---------+-----------+----------+-----------------+ FV Prox  Full                                                           +---------+---------------+---------+-----------+----------+-----------------+ FV Mid   Full  Yes      Yes                                    +---------+---------------+---------+-----------+----------+-----------------+ FV DistalFull                                                           +---------+---------------+---------+-----------+----------+-----------------+ PFV      Full                                                           +---------+---------------+---------+-----------+----------+-----------------+ POP      Full           Yes      Yes                                    +---------+---------------+---------+-----------+----------+-----------------+ PTV      Full                                                           +---------+---------------+---------+-----------+----------+-----------------+ PERO                                                  poorly visualized +---------+---------------+---------+-----------+----------+-----------------+   +---------+---------------+---------+-----------+----------+-----------------+ LEFT     CompressibilityPhasicitySpontaneityPropertiesThrombus Aging    +---------+---------------+---------+-----------+----------+-----------------+ CFV      Partial        Yes      No                   Age Indeterminate +---------+---------------+---------+-----------+----------+-----------------+ SFJ      Partial                                                        +---------+---------------+---------+-----------+----------+-----------------+ FV Prox  Partial                                      Chronic           +---------+---------------+---------+-----------+----------+-----------------+ FV Mid   Full            No       No                                     +---------+---------------+---------+-----------+----------+-----------------+ FV DistalFull           Yes  Yes                                    +---------+---------------+---------+-----------+----------+-----------------+ PFV      Full                                                           +---------+---------------+---------+-----------+----------+-----------------+ POP      Partial        Yes      Yes                                    +---------+---------------+---------+-----------+----------+-----------------+ PTV      Partial                                      Chronic           +---------+---------------+---------+-----------+----------+-----------------+ PERO     Partial                                      Chronic           +---------+---------------+---------+-----------+----------+-----------------+ Poorly visualized calf veins.    Summary: RIGHT: - There is no evidence of deep vein thrombosis in the lower extremity. However, portions of this examination were limited- see technologist comments above.  - No cystic structure found in the popliteal fossa.  LEFT: - Findings consistent with age indeterminate deep vein thrombosis involving the left common femoral vein.  - Findings consistent with chronic deep vein thrombosis involving the left femoral vein, left popliteal vein, left posterior tibial veins, and left peroneal veins.  - Findings appear improved from previous examination.  *See table(s) above for measurements and observations. Electronically signed by Debby Robertson on 07/28/2024 at 11:11:02 AM.    Final    CT ABDOMEN PELVIS W CONTRAST Result Date: 07/27/2024 CLINICAL DATA:  Abdominal pain.  Possible pulmonary embolism. EXAM: CT ANGIOGRAPHY CHEST CT ABDOMEN AND PELVIS WITH CONTRAST TECHNIQUE: Multidetector CT imaging of the chest was performed using the standard protocol during bolus  administration of intravenous contrast. Multiplanar CT image reconstructions and MIPs were obtained to evaluate the vascular anatomy. Multidetector CT imaging of the abdomen and pelvis was performed using the standard protocol during bolus administration of intravenous contrast. RADIATION DOSE REDUCTION: This exam was performed according to the departmental dose-optimization program which includes automated exposure control, adjustment of the mA and/or kV according to patient size and/or use of iterative reconstruction technique. CONTRAST:  59mL OMNIPAQUE  IOHEXOL  350 MG/ML SOLN COMPARISON:  CT abdomen/pelvis 07/19/2024 and CTA chest 03/16/2022 FINDINGS: CTA CHEST FINDINGS Cardiovascular: Mild cardiomegaly slightly worse. The thoracic aorta is normal in caliber and otherwise unremarkable. Pulmonary arterial system is adequately opacified demonstrates a linear embolus over the left main pulmonary artery with small embolus over a proximal lingular pulmonary artery. Small emboli are present over the proximal right upper, middle and lower lobar pulmonary arteries. RV/LV ratio = 52/41 compatible with right heart strain. Remaining vascular structures are unremarkable. Mediastinum/Nodes: No evidence of mediastinal or hilar  adenopathy. Remaining mediastinal structures are unremarkable. Lungs/Pleura: Lungs are adequately inflated without acute airspace process or effusion. Airways are unremarkable. Musculoskeletal: No focal abnormality. Review of the MIP images confirms the above findings. CT ABDOMEN and PELVIS FINDINGS Hepatobiliary: Previous cholecystectomy. Air within the biliary tree and stable prominence of the common bile duct unchanged likely due to patient's post cholecystectomy state. No focal liver mass. Pancreas: Normal. Spleen: Normal. Adrenals/Urinary Tract: Adrenal glands are normal. Kidneys are normal in size. No significant hydronephrosis or nephrolithiasis. Stable 1.5 cm left renal cyst. No further imaging  follow-up is recommended. Ureters and bladder are normal. Stomach/Bowel: Stomach and small bowel are normal. Appendix is normal. Colon is decompressed from the a Paddock flexure distally. There is mild diverticulosis of the colon. Vascular/Lymphatic: Abdominal aorta is normal in caliber. Remaining vascular structures again notable for low-density within the left external iliac vein suspicious for thrombus. No adenopathy. Reproductive: Status post hysterectomy. No adnexal masses. Other: No free fluid or focal inflammatory change. Tiny ventral hernia over the epigastric region containing only peritoneal fat. Evidence of previous ventral hernia repair in the midline of the supraumbilical region. Musculoskeletal: Stable grade 1 anterolisthesis of L3 on L4. No focal abnormalities. Review of the MIP images confirms the above findings. IMPRESSION: 1. Evidence of bilateral pulmonary emboli. Evidence of right heart strain. 2. Mild cardiomegaly slightly worse. 3. No acute findings in the abdomen/pelvis. 4. Mild colonic diverticulosis. 5. Stable low-density within the left external iliac vein suspicious for thrombus as described previously. Continue to recommend left lower extremity venous ultrasound for further evaluation if not done. 6. Stable grade 1 anterolisthesis of L3 on L4. Critical Value/emergent results were called by telephone at the time of interpretation on 07/27/2024 at 2:50 pm to provider ANDREW TEE , who verbally acknowledged these results. Electronically Signed   By: Toribio Agreste M.D.   On: 07/27/2024 14:50   CT Angio Chest PE W and/or Wo Contrast Result Date: 07/27/2024 CLINICAL DATA:  Abdominal pain.  Possible pulmonary embolism. EXAM: CT ANGIOGRAPHY CHEST CT ABDOMEN AND PELVIS WITH CONTRAST TECHNIQUE: Multidetector CT imaging of the chest was performed using the standard protocol during bolus administration of intravenous contrast. Multiplanar CT image reconstructions and MIPs were obtained to evaluate  the vascular anatomy. Multidetector CT imaging of the abdomen and pelvis was performed using the standard protocol during bolus administration of intravenous contrast. RADIATION DOSE REDUCTION: This exam was performed according to the departmental dose-optimization program which includes automated exposure control, adjustment of the mA and/or kV according to patient size and/or use of iterative reconstruction technique. CONTRAST:  59mL OMNIPAQUE  IOHEXOL  350 MG/ML SOLN COMPARISON:  CT abdomen/pelvis 07/19/2024 and CTA chest 03/16/2022 FINDINGS: CTA CHEST FINDINGS Cardiovascular: Mild cardiomegaly slightly worse. The thoracic aorta is normal in caliber and otherwise unremarkable. Pulmonary arterial system is adequately opacified demonstrates a linear embolus over the left main pulmonary artery with small embolus over a proximal lingular pulmonary artery. Small emboli are present over the proximal right upper, middle and lower lobar pulmonary arteries. RV/LV ratio = 52/41 compatible with right heart strain. Remaining vascular structures are unremarkable. Mediastinum/Nodes: No evidence of mediastinal or hilar adenopathy. Remaining mediastinal structures are unremarkable. Lungs/Pleura: Lungs are adequately inflated without acute airspace process or effusion. Airways are unremarkable. Musculoskeletal: No focal abnormality. Review of the MIP images confirms the above findings. CT ABDOMEN and PELVIS FINDINGS Hepatobiliary: Previous cholecystectomy. Air within the biliary tree and stable prominence of the common bile duct unchanged likely due to patient's post cholecystectomy state.  No focal liver mass. Pancreas: Normal. Spleen: Normal. Adrenals/Urinary Tract: Adrenal glands are normal. Kidneys are normal in size. No significant hydronephrosis or nephrolithiasis. Stable 1.5 cm left renal cyst. No further imaging follow-up is recommended. Ureters and bladder are normal. Stomach/Bowel: Stomach and small bowel are normal.  Appendix is normal. Colon is decompressed from the a Paddock flexure distally. There is mild diverticulosis of the colon. Vascular/Lymphatic: Abdominal aorta is normal in caliber. Remaining vascular structures again notable for low-density within the left external iliac vein suspicious for thrombus. No adenopathy. Reproductive: Status post hysterectomy. No adnexal masses. Other: No free fluid or focal inflammatory change. Tiny ventral hernia over the epigastric region containing only peritoneal fat. Evidence of previous ventral hernia repair in the midline of the supraumbilical region. Musculoskeletal: Stable grade 1 anterolisthesis of L3 on L4. No focal abnormalities. Review of the MIP images confirms the above findings. IMPRESSION: 1. Evidence of bilateral pulmonary emboli. Evidence of right heart strain. 2. Mild cardiomegaly slightly worse. 3. No acute findings in the abdomen/pelvis. 4. Mild colonic diverticulosis. 5. Stable low-density within the left external iliac vein suspicious for thrombus as described previously. Continue to recommend left lower extremity venous ultrasound for further evaluation if not done. 6. Stable grade 1 anterolisthesis of L3 on L4. Critical Value/emergent results were called by telephone at the time of interpretation on 07/27/2024 at 2:50 pm to provider ANDREW TEE , who verbally acknowledged these results. Electronically Signed   By: Toribio Agreste M.D.   On: 07/27/2024 14:50        Scheduled Meds:  allopurinol   300 mg Oral BID   cyanocobalamin   1,000 mcg Subcutaneous Daily   Followed by   NOREEN ON 08/04/2024] cyanocobalamin   1,000 mcg Subcutaneous Q30 days   [START ON 07/30/2024] DULoxetine   30 mg Oral Daily   folic acid   1 mg Oral Daily   insulin  aspart  0-15 Units Subcutaneous Q4H   mirtazapine  15 mg Oral QHS   pantoprazole   40 mg Oral Daily   potassium chloride   40 mEq Oral Once   rosuvastatin   40 mg Oral Daily   sodium chloride  flush  3 mL Intravenous Q12H    Continuous Infusions:  heparin  1,000 Units/hr (07/29/24 0529)   magnesium  sulfate bolus IVPB 4 g (07/29/24 0914)     LOS: 2 days       Brayton Lye, MD [facility]   To contact the attending provider between 7A-7P or the covering provider during after hours 7P-7A, please log into the web site [email or web address] and access using universal [place] password for that web site. If you do not have the password, please call the hospital operator.  07/29/2024, 9:56 AM

## 2024-07-30 DIAGNOSIS — I2602 Saddle embolus of pulmonary artery with acute cor pulmonale: Secondary | ICD-10-CM | POA: Diagnosis not present

## 2024-07-30 LAB — BASIC METABOLIC PANEL WITH GFR
Anion gap: 11 (ref 5–15)
BUN: 8 mg/dL (ref 8–23)
CO2: 21 mmol/L — ABNORMAL LOW (ref 22–32)
Calcium: 9.2 mg/dL (ref 8.9–10.3)
Chloride: 109 mmol/L (ref 98–111)
Creatinine, Ser: 0.76 mg/dL (ref 0.44–1.00)
GFR, Estimated: >60 mL/min (ref >60)
Glucose, Bld: 80 mg/dL (ref 70–99)
Potassium: 3.8 mmol/L (ref 3.5–5.1)
Sodium: 141 mmol/L (ref 135–145)

## 2024-07-30 LAB — CBC
HCT: 36.5 % (ref 36.0–46.0)
Hemoglobin: 11.9 g/dL — ABNORMAL LOW (ref 12.0–15.0)
MCH: 32.5 pg (ref 26.0–34.0)
MCHC: 32.6 g/dL (ref 30.0–36.0)
MCV: 99.7 fL (ref 80.0–100.0)
Platelets: 154 K/uL (ref 150–400)
RBC: 3.66 MIL/uL — ABNORMAL LOW (ref 3.87–5.11)
RDW: 16.6 % — ABNORMAL HIGH (ref 11.5–15.5)
WBC: 5.7 K/uL (ref 4.0–10.5)
nRBC: 0 % (ref 0.0–0.2)

## 2024-07-30 LAB — GLUCOSE, CAPILLARY
Glucose-Capillary: 103 mg/dL — ABNORMAL HIGH (ref 70–99)
Glucose-Capillary: 116 mg/dL — ABNORMAL HIGH (ref 70–99)
Glucose-Capillary: 140 mg/dL — ABNORMAL HIGH (ref 70–99)
Glucose-Capillary: 76 mg/dL (ref 70–99)
Glucose-Capillary: 83 mg/dL (ref 70–99)
Glucose-Capillary: 97 mg/dL (ref 70–99)
Glucose-Capillary: 97 mg/dL (ref 70–99)

## 2024-07-30 LAB — MAGNESIUM: Magnesium: 2 mg/dL (ref 1.7–2.4)

## 2024-07-30 LAB — PHOSPHORUS: Phosphorus: 1.9 mg/dL — ABNORMAL LOW (ref 2.5–4.6)

## 2024-07-30 MED ORDER — QUETIAPINE FUMARATE 25 MG PO TABS: 12.5000 mg | ORAL_TABLET | Freq: Every day | ORAL | Status: DC

## 2024-07-30 MED ORDER — QUETIAPINE FUMARATE 25 MG PO TABS: 25.0000 mg | ORAL_TABLET | Freq: Every day | ORAL | Status: DC

## 2024-07-30 MED ORDER — HALOPERIDOL LACTATE 5 MG/ML IJ SOLN
5.0000 mg | Freq: Four times a day (QID) | INTRAMUSCULAR | Status: DC | PRN
  Administered 2024-07-30: 5 mg via INTRAMUSCULAR

## 2024-07-30 MED ORDER — HALOPERIDOL LACTATE 5 MG/ML IJ SOLN
2.0000 mg | Freq: Four times a day (QID) | INTRAMUSCULAR | Status: DC | PRN
  Filled 2024-07-30 (×2): qty 1

## 2024-07-30 MED ORDER — OLANZAPINE 10 MG IM SOLR
10.0000 mg | Freq: Once | INTRAMUSCULAR | Status: AC
  Administered 2024-07-30: 10 mg via INTRAMUSCULAR
  Filled 2024-07-30: qty 10

## 2024-07-30 MED ORDER — STERILE WATER FOR INJECTION IJ SOLN
INTRAMUSCULAR | Status: AC
  Administered 2024-07-30: 2.1 mL
  Filled 2024-07-30: qty 10

## 2024-07-30 MED ORDER — DULOXETINE HCL 60 MG PO CPEP
60.0000 mg | ORAL_CAPSULE | Freq: Every day | ORAL | Status: DC
  Administered 2024-07-31 – 2024-08-07 (×8): 60 mg via ORAL
  Filled 2024-07-30 (×8): qty 1

## 2024-07-30 MED ORDER — QUETIAPINE FUMARATE 50 MG PO TABS
50.0000 mg | ORAL_TABLET | ORAL | Status: DC
  Administered 2024-07-30 – 2024-07-31 (×2): 50 mg via ORAL
  Filled 2024-07-30 (×2): qty 1

## 2024-07-30 NOTE — Progress Notes (Signed)
 Progress note   Rachel Black  FMW:[id number] DOB: 05/03/1951 DOA: 07/27/2024 PCP: Chandra Toribio [name], MD   No chief complaint on file.   Brief Narrative:   Rachel Black is a 73 y.o. female with past medical history  of  hypertension, hyperlipidemia, CVA, DM 2, gout, chronic abdominal pain, IBS, GERD, PFO, She underwent ORIF without internal fixation of posterior malleolus and left ankle syndesmosis by Dr. Barton on 7/9, husband at bedside assists with the history, apparently she was at the SNF facility until 07/15/2024, unfortunately she was still nonambulatory,  She was seen recently in ed and found to have Left iliofemoral and distal DVT  and offered thrombectomy by vascular and pt declined on 07/19/2024. Pt was d/c with eliquis , she presents to ED secondary to generalized pain, CTA chest has been obtained, which was significant for bilateral PE with right heart strain, so she is admitted for further workup.  Assessment & Plan:   Active Problems:   Acute pulmonary embolism (HCC)  Bilateral PE with right heart strain Acute DVT in left lower extremity. - Patient has been on Eliquis  for 7 days, her workup in ED significant for bilateral PE with right heart strain. - 2D echo with preserved EF, no evidence of right heart strain, but mildly reduced ventricle systolic function with no enlargement  - Repeat venous Dopplers showing no extension or worsening of his acute left DVT . - He is on heparin  drip, given reassuring echo and Dopplers will transition back to Eliquis  (would not consider this Eliquis  failure as it was just started in outpatient setting)  - Discussed with patient that now she can work with PT/OT .  Hypertension - Blood pressure on admission, home medication including Imdur  and olmesartan  on hold, blood pressure has improved today, will resume if continues to increase .  (Remain with some significant low readings so I will hold on resuming)  Hospital delirium -poor  night sleep overnight, confusion this morning due to hospital delirium, I will resume evening Seroquel at lower dose, was discontinued day before yesterday due to increased somnolence daytime .     Anemia B12 deficiency Folic acid  deficiency -Recent workup significant for B12 and folic acid , will continue with supplements.  Deconditioning Status post ORIF 03/22/2024 for left trimalleolar fracture secondary to fall Status post ORIF 7/9, by Dr. Barton.   - Discussed with [facility] PA Ollice, she is currently weightbearing as tolerated  - Continue with calcium  and vitamin D .    Mixed hyperlipidemia  Continue with fenofibrate    GERD Continue Protonix     Hx Gout Continue allopurinol    Obesity, class I Body mass index is 31.14 kg/m.  Hypokalemia - Low at 3.4 today, will replace  Severe hypomagnesemia - Significantly low at 1.2, will replace   Depression - Continue with Cymbalta     DVT prophylaxis: (Heparin  drip) Code Status: (Full) Family Communication: (Discussed with husband at bedside) Disposition:   Status is: Inpatient    Consultants:  None   Subjective:  Patient with increased confusion overnight, poor night sleep, as well this morning with hospital delirium  Objective: Vitals:   07/30/24 0508 07/30/24 0515 07/30/24 0744 07/30/24 0810  BP: 110/83  (!) 94/46 (!) 145/75  Pulse:   68   Resp:   20   Temp:   97.9 F (36.6 C)   TempSrc:   Oral   SpO2:      Weight:  87.5 kg      Intake/Output Summary (Last  24 hours) at 07/30/2024 1100 Last data filed at 07/29/2024 2318 Gross per 24 hour  Intake --  Output 1375 ml  Net -1375 ml    Filed Weights   07/28/24 0500 07/29/24 0456 07/30/24 0515  Weight: 87.3 kg 98.1 kg 87.5 kg    Examination:  Awake Alert, confused  symmetrical Chest wall movement, Good air movement bilaterally, CTAB RRR,No Gallops,Rubs or new Murmurs, No Parasternal Heave +ve B.Sounds, Abd Soft, No tenderness, No rebound -  guarding or rigidity. No Cyanosis, Clubbing or edema, No new Rash or bruise        Data Reviewed: I have personally reviewed following labs and imaging studies  CBC: Recent Labs  Lab 07/27/24 1250 07/28/24 0352 07/29/24 0227 07/30/24 0609  WBC 7.7 6.5 5.9 5.7  HGB 13.8 10.4* 10.5* 11.9*  HCT 42.8 32.8* 34.0* 36.5  MCV 99.3 99.1 103.3* 99.7  PLT 253 180 172 154    Basic Metabolic Panel: Recent Labs  Lab 07/27/24 1250 07/28/24 0352 07/29/24 0605 07/30/24 0609  NA 142 142 141 141  K 3.9 2.9* 3.4* 3.8  CL 107 108 110 109  CO2 21* 23 23 21*  GLUCOSE 108* 84 83 80  BUN 12 10 9 8   CREATININE 0.88 0.85 0.75 0.76  CALCIUM  9.7 8.9 8.6* 9.2  MG  --   --  1.2* 2.0  PHOS  --   --  2.6 1.9*    GFR: Estimated Creatinine Clearance: 70.8 mL/min (by C-G formula based on SCr of 0.76 mg/dL).  Liver Function Tests: Recent Labs  Lab 07/27/24 1250 07/28/24 0352  AST 21 14*  ALT 12 9  ALKPHOS 63 48  BILITOT 1.0 0.8  PROT 5.9* 4.3*  ALBUMIN  3.1* 2.2*    CBG: Recent Labs  Lab 07/29/24 1533 07/29/24 1947 07/29/24 2346 07/30/24 0412 07/30/24 0744  GLUCAP 87 126* 100* 83 76     No results found for this or any previous visit (from the past 240 hours).        Radiology Studies: ECHOCARDIOGRAM COMPLETE Result Date: 07/28/2024    ECHOCARDIOGRAM REPORT   Patient Name:   Rachel Black Date of Exam: 07/28/2024 Medical Rec #:  [id number]       Height:       66.0 in Accession #:    [id number]      Weight:       192.5 lb Date of Birth:  10-20-1950       BSA:          1.967 m Patient Age:    73 years        BP:           122/64 mmHg Patient Gender: F               HR:           86 bpm. Exam Location:  Inpatient Procedure: 2D Echo, Color Doppler and Cardiac Doppler (Both Spectral and Color            Flow Doppler were utilized during procedure). Indications:    Pulmonary Embolus  History:        Patient has prior history of Echocardiogram examinations, most                  recent 10/17/2017. Risk Factors:Diabetes, Dyslipidemia and                 Hypertension.  Sonographer:    Tinnie Gosling RDCS Referring Phys: [phone]  EKTA V PATEL IMPRESSIONS  1. Left ventricular ejection fraction, by estimation, is 50 to 55%. The left ventricle has low normal function. The left ventricle demonstrates regional wall motion abnormalities (see scoring diagram/findings for description). Indeterminate diastolic filling due to E-A fusion.  2. Right ventricular systolic function is low normal. The right ventricular size is normal.  3. The mitral valve is normal in structure. Trivial mitral valve regurgitation. No evidence of mitral stenosis.  4. The aortic valve is normal in structure. Aortic valve regurgitation is not visualized. No aortic stenosis is present.  5. The inferior vena cava is normal in size with greater than 50% respiratory variability, suggesting right atrial pressure of 3 mmHg. Comparison(s): A prior study was performed on 10/17/2017. Prior images unable to be directly viewed, comparison made by report only. EF 55-60% without regional wall motion abnormalities. Grade I diastolic dysfunction. FINDINGS  Left Ventricle: Left ventricular ejection fraction, by estimation, is 50 to 55%. The left ventricle has low normal function. The left ventricle demonstrates regional wall motion abnormalities. The left ventricular internal cavity size was normal in size. There is no left ventricular hypertrophy. Indeterminate diastolic filling due to E-A fusion.  LV Wall Scoring: The entire inferior wall is hypokinetic. Right Ventricle: The right ventricular size is normal. No increase in right ventricular wall thickness. Right ventricular systolic function is low normal. Left Atrium: Left atrial size was normal in size. Right Atrium: Right atrial size was normal in size. Pericardium: There is no evidence of pericardial effusion. Mitral Valve: The mitral valve is normal in structure. Trivial mitral valve  regurgitation. No evidence of mitral valve stenosis. Tricuspid Valve: The tricuspid valve is normal in structure. Tricuspid valve regurgitation is trivial. No evidence of tricuspid stenosis. Aortic Valve: The aortic valve is normal in structure. Aortic valve regurgitation is not visualized. No aortic stenosis is present. Pulmonic Valve: The pulmonic valve was normal in structure. Pulmonic valve regurgitation is not visualized. No evidence of pulmonic stenosis. Aorta: The aortic root and ascending aorta are structurally normal, with no evidence of dilitation. Venous: The inferior vena cava is normal in size with greater than 50% respiratory variability, suggesting right atrial pressure of 3 mmHg. IAS/Shunts: No atrial level shunt detected by color flow Doppler.  LEFT VENTRICLE PLAX 2D LVIDd:         4.60 cm   Diastology LVIDs:         3.60 cm   LV e' medial:    6.42 cm/s LV PW:         1.10 cm   LV E/e' medial:  10.4 LV IVS:        1.00 cm   LV e' lateral:   12.60 cm/s LVOT diam:     2.40 cm   LV E/e' lateral: 5.3 LV SV:         82 LV SV Index:   42 LVOT Area:     4.52 cm LV IVRT:       135 msec  RIGHT VENTRICLE             IVC RV S prime:     18.70 cm/s  IVC diam: 1.50 cm TAPSE (M-mode): 1.7 cm LEFT ATRIUM             Index        RIGHT ATRIUM          Index LA diam:        3.80 cm 1.93 cm/m   RA Area:  9.51 cm LA Vol (A2C):   46.1 ml 23.43 ml/m  RA Volume:   15.60 ml 7.93 ml/m LA Vol (A4C):   41.4 ml 21.04 ml/m LA Biplane Vol: 46.7 ml 23.74 ml/m  AORTIC VALVE LVOT Vmax:   107.00 cm/s LVOT Vmean:  73.300 cm/s LVOT VTI:    0.181 m  AORTA Ao Root diam: 3.60 cm Ao Asc diam:  3.40 cm MITRAL VALVE MV Area (PHT): 4.60 cm     SHUNTS MV Decel Time: 165 msec     Systemic VTI:  0.18 m MV E velocity: 66.90 cm/s   Systemic Diam: 2.40 cm MV A velocity: 127.00 cm/s MV E/A ratio:  0.53 Emeline Calender Electronically signed by Emeline Calender Signature Date/Time: 07/28/2024/4:48:47 PM    Final         Scheduled Meds:   allopurinol   300 mg Oral BID   apixaban   5 mg Oral BID   calcium -vitamin D   1 tablet Oral BID   cyanocobalamin   1,000 mcg Subcutaneous Daily   Followed by   NOREEN ON 08/04/2024] cyanocobalamin   1,000 mcg Subcutaneous Q30 days   DULoxetine   30 mg Oral Daily   folic acid   1 mg Oral Daily   insulin  aspart  0-15 Units Subcutaneous Q4H   mirtazapine  15 mg Oral QHS   pantoprazole   40 mg Oral Daily   rosuvastatin   40 mg Oral Daily   sodium chloride  flush  3 mL Intravenous Q12H   Continuous Infusions:     LOS: 3 days       Brayton Lye, MD [facility]   To contact the attending provider between 7A-7P or the covering provider during after hours 7P-7A, please log into the web site [email or web address] and access using universal [place] password for that web site. If you do not have the password, please call the hospital operator.  07/30/2024, 11:00 AM

## 2024-07-30 NOTE — Evaluation (Addendum)
 Occupational Therapy Evaluation Patient Details Name: Rachel Black MRN: [id number] DOB: [birth year] Today's Date: 07/30/2024   History of Present Illness   73 y.o. female presents to [facility] hospital on 07/30/2024 from home with B PE and R heart strain. Pt was recently d/c from [facility] week prior, with which she was here d/t lack of sufficiency caregiver support following long term stay and d/c from SNF 11/1 (d/t L ankle fx in July 2025). PMH includes: arthritis, DM, gout, PFO, CVA, HTN, ERCP, TKA bil., CAD.     Clinical Impressions Per spouse, pt not ambulatory since leaving SNF, and has assist from spouse for some ADLs. Pt currently oriented to self, needs mod cues for safety/redirection throughout session. Pt needs up to max A for ADLs, and max A +2 for lateral scoot transfer to bed. Pt tachy to 150s with transfer from chair > bed. Pt presenting with impairments listed below, will follow acutely. Patient will benefit from continued inpatient follow up therapy, <3 hours/day.      If plan is discharge home, recommend the following:   Two people to help with walking and/or transfers;A lot of help with bathing/dressing/bathroom;Assistance with cooking/housework;Direct supervision/assist for medications management;Direct supervision/assist for financial management;Assist for transportation;Help with stairs or ramp for entrance     Functional Status Assessment   Patient has had a recent decline in their functional status and demonstrates the ability to make significant improvements in function in a reasonable and predictable amount of time.     Equipment Recommendations   Other (comment) (defer)     Recommendations for Other Services   PT consult     Precautions/Restrictions   Precautions Precautions: Fall (Simultaneous filing. User may not have seen previous data.) Restrictions Weight Bearing Restrictions Per Provider Order: Yes (Simultaneous filing. User may not have seen  previous data.) LLE Weight Bearing Per Provider Order: Weight bearing as tolerated (Simultaneous filing. User may not have seen previous data.)     Mobility Bed Mobility Overal bed mobility: Needs Assistance (Simultaneous filing. User may not have seen previous data.) Bed Mobility: Sit to Supine (Simultaneous filing. User may not have seen previous data.)       Sit to supine: Max assist, +2 for physical assistance        Transfers Overall transfer level: Needs assistance (Simultaneous filing. User may not have seen previous data.) Equipment used: 1 person hand held assist (Simultaneous filing. User may not have seen previous data.) Transfers: Bed to chair/wheelchair/BSC (Simultaneous filing. User may not have seen previous data.)            Lateral/Scoot Transfers: Max assist, +2 physical assistance        Balance Overall balance assessment: Needs assistance (Simultaneous filing. User may not have seen previous data.) Sitting-balance support: No upper extremity supported, Feet unsupported (Simultaneous filing. User may not have seen previous data.) Sitting balance-Leahy Scale: Fair (Simultaneous filing. User may not have seen previous data.)                                     ADL either performed or assessed with clinical judgement   ADL Overall ADL's : Needs assistance/impaired Eating/Feeding: Set up;Sitting   Grooming: Set up;Sitting   Upper Body Bathing: Sitting;Moderate assistance   Lower Body Bathing: Maximal assistance   Upper Body Dressing : Moderate assistance   Lower Body Dressing: Maximal assistance   Toilet Transfer: Maximal assistance;+2 for  physical assistance   Toileting- Clothing Manipulation and Hygiene: Maximal assistance       Functional mobility during ADLs: +2 for physical assistance;Maximal assistance       Vision   Additional Comments: not formally assessed     Perception Perception: Not tested       Praxis  Praxis: Not tested       Pertinent Vitals/Pain Pain Assessment Pain Assessment: No/denies pain Faces Pain Scale:  (Simultaneous filing. User may not have seen previous data.)     Extremity/Trunk Assessment Upper Extremity Assessment Upper Extremity Assessment: Generalized weakness (Simultaneous filing. User may not have seen previous data.)   Lower Extremity Assessment Lower Extremity Assessment: Defer to PT evaluation (Simultaneous filing. User may not have seen previous data.)   Cervical / Trunk Assessment Cervical / Trunk Assessment: Kyphotic (Simultaneous filing. User may not have seen previous data.)   Communication Communication Communication: No apparent difficulties (Simultaneous filing. User may not have seen previous data.)   Cognition Arousal: Alert (Simultaneous filing. User may not have seen previous data.) Behavior During Therapy: Restless, Impulsive, Agitated (Simultaneous filing. User may not have seen previous data.) Cognition: Cognition impaired   Orientation impairments: Situation, Time, Place Awareness: Online awareness impaired Memory impairment (select all impairments): Short-term memory Attention impairment (select first level of impairment): Sustained attention Executive functioning impairment (select all impairments): Problem solving, Initiation OT - Cognition Comments: pt asking multiple times about use of purewick and not oriented to place                 Following commands: Intact (Simultaneous filing. User may not have seen previous data.)       Cueing  General Comments   Cueing Techniques: Verbal cues (Simultaneous filing. User may not have seen previous data.)  HR tachy to 150s with lateral scoot transfer (Simultaneous filing. User may not have seen previous data.)   Exercises     Shoulder Instructions      Home Living Family/patient expects to be discharged to:: Private residence Living Arrangements: Spouse/significant  other Available Help at Discharge: Family;Available PRN/intermittently (Spouse works full time, pt alone during day) Type of Home: House Home Access: Stairs to enter [employer] of Steps: 2 (1 and then 1 step) Entrance Stairs-Rails: None Home Layout: One level     Bathroom Shower/Tub: Producer, Television/film/video: Standard Bathroom Accessibility: Yes   Home Equipment: Agricultural Consultant (2 wheels);Shower seat;Wheelchair - manual   Additional Comments: Per husband, she stays in bed all day since being d/c from SNF. Only way she gets out of bed is with paramedic when at home since L ankle fx in July 2025.      Prior Functioning/Environment Prior Level of Function : Needs assist             Mobility Comments: Bedbound at home ADLs Comments: Typically uses bed pan at home, assist for dressing. Sponge bathes at baseline.    OT Problem List: Decreased strength;Decreased range of motion;Decreased activity tolerance;Impaired balance (sitting and/or standing);Decreased cognition;Decreased safety awareness;Cardiopulmonary status limiting activity   OT Treatment/Interventions: Self-care/ADL training;Therapeutic exercise;Energy conservation;DME and/or AE instruction;Therapeutic activities;Patient/family education;Balance training      OT Goals(Current goals can be found in the care plan section)   Acute Rehab OT Goals Patient Stated Goal: unable to state OT Goal Formulation: With patient/family Time For Goal Achievement: 08/13/24 Potential to Achieve Goals: Good ADL Goals Pt Will Perform Upper Body Dressing: with contact guard assist;sitting Pt Will Perform Lower Body Dressing:  with contact guard assist;sit to/from stand;sitting/lateral leans Pt Will Transfer to Toilet: with contact guard assist;ambulating;regular height toilet Additional ADL Goal #2: pt will follow 2 step command in prep for ADLs   OT Frequency:  Min 2X/week    Co-evaluation               AM-PAC OT 6 Clicks Daily Activity     Outcome Measure Help from another person eating meals?: A Little Help from another person taking care of personal grooming?: A Little Help from another person toileting, which includes using toliet, bedpan, or urinal?: A Lot Help from another person bathing (including washing, rinsing, drying)?: A Lot Help from another person to put on and taking off regular upper body clothing?: A Lot Help from another person to put on and taking off regular lower body clothing?: A Lot 6 Click Score: 14   End of Session Equipment Utilized During Treatment: Gait belt Nurse Communication: Mobility status  Activity Tolerance: Patient tolerated treatment well Patient left: in bed;with call bell/phone within reach;with bed alarm set;with family/visitor present  OT Visit Diagnosis: Other abnormalities of gait and mobility (R26.89);Unsteadiness on feet (R26.81);Muscle weakness (generalized) (M62.81)                Time: 1230-1250 OT Time Calculation (min): 20 min Charges:  OT General Charges $OT Visit: 1 Visit OT Evaluation $OT Eval Moderate Complexity: 1 Mod  Oretta Berkland K, OTD, OTR/L SecureChat Preferred Acute Rehab [phone]   Norma Ignasiak K Koonce 07/30/2024, 1:37 PM

## 2024-07-30 NOTE — Evaluation (Signed)
 Physical Therapy Evaluation Patient Details Name: Rachel Black MRN: [id number] DOB: 1951-01-02 Today's Date: 07/30/2024  History of Present Illness  73 y.o. female presents to [facility] hospital on 07/30/2024 from home with B PE and R heart strain. Pt was recently d/c from [facility] week prior, with which she was here d/t lack of sufficiency caregiver support following long term stay and d/c from SNF 11/1 (d/t L ankle fx in July 2025). PMH includes: arthritis, DM, gout, PFO, CVA, HTN, ERCP, TKA bil., CAD.  Clinical Impression  Pt with complicated HPI (see above). Per husband, the only times she has gotten out of bed since being home is when paramedics get her out of bed to bring her to the hospital. She has been using bed pan and does bird baths in bed. Husband works full time, leaving pt alone during the day. Currently, pt is only oriented to name, not situation, time, or place. She repeated I really think I need to go home throughout the session, and was generally impulsive in attempting to stand and transfer before therapist instruction. Requiring modA for bed mobility, and maxA +2 for bed>BSC transfer. Pt with dec ability to follow one step commands, requiring repeat VC for successful step pivot transfer. Utilized Stedy for [employer], with which pt c/o great inc in L ankle and foot pain. Recommending post-acute rehab <3hrs/day to improve activity tolerance and cognitive deficits. Acute PT to follow.    Vitals: Supine in bed: 113 BPM With activity: increase to 140-145BPM, but with rest would return to 120-125 BPM      If plan is discharge home, recommend the following: Two people to help with walking and/or transfers;A lot of help with bathing/dressing/bathroom;Assistance with cooking/housework;Direct supervision/assist for medications management;Direct supervision/assist for financial management;Assist for transportation;Supervision due to cognitive status   Can travel by private vehicle   No     Equipment Recommendations Hospital bed;Hoyer lift  Recommendations for Other Services       Functional Status Assessment Patient has had a recent decline in their functional status and demonstrates the ability to make significant improvements in function in a reasonable and predictable amount of time.     Precautions / Restrictions Precautions Precautions: Fall Recall of Precautions/Restrictions: Impaired Restrictions Weight Bearing Restrictions Per Provider Order: Yes LLE Weight Bearing Per Provider Order: Weight bearing as tolerated      Mobility  Bed Mobility Overal bed mobility: Needs Assistance Bed Mobility: Sit to Supine     Supine to sit: Mod assist, HOB elevated     General bed mobility comments: Requiring assist for both trunk and B LE management off EOB.    Transfers Overall transfer level: Needs assistance Equipment used: None Transfers: Bed to chair/wheelchair/BSC Sit to Stand: Max assist, +2 physical assistance     Squat pivot transfers: Max assist, +2 physical assistance     General transfer comment: For bed>BSC, pt with great difficulty taking steps bilaterally to pivot hips, requiring maxA +2 and VC for stepping and turning. Utilized Stedy for [employer], with which pt c/o inc L ankle and foot pain with WB. Demonstrated poor ability to carry through instructions regarding Stedy, and hunched over grab bars during STS, seemingly due to dec coordination to mainnain grip on bars.    Ambulation/Gait                  Stairs            Wheelchair Mobility     Tilt Bed  Modified Rankin (Stroke Patients Only)       Balance Overall balance assessment: Needs assistance Sitting-balance support: No upper extremity supported, Feet unsupported Sitting balance-Leahy Scale: Fair     Standing balance support: Bilateral upper extremity supported, During functional activity Standing balance-Leahy Scale: Poor Standing balance comment: MaxA  +2                             Pertinent Vitals/Pain Pain Assessment Faces Pain Scale: Hurts even more Pain Location: L foot/ankle Pain Descriptors / Indicators: Grimacing Pain Intervention(s): Limited activity within patient's tolerance, Monitored during session    Home Living Family/patient expects to be discharged to:: Private residence Living Arrangements: Spouse/significant other Available Help at Discharge: Family;Available PRN/intermittently (Spouse works full time, pt alone during day) Type of Home: House Home Access: Stairs to enter Entrance Stairs-Rails: None Entrance Stairs-Number of Steps: 2 (1 and then 1 step)   Home Layout: One level Home Equipment: [employer] (2 wheels);Shower seat;Wheelchair - manual Additional Comments: Per husband, she stays in bed all day since being d/c from SNF. Only way she gets out of bed is with paramedic when at home since L ankle fx in July 2025.    Prior Function Prior Level of Function : Needs assist             Mobility Comments: Bedbound at home ADLs Comments: Typically uses bed pan at home, assist for dressing. Sponge bathes at baseline.     Extremity/Trunk Assessment   Upper Extremity Assessment Upper Extremity Assessment: Defer to OT evaluation         Cervical / Trunk Assessment Cervical / Trunk Assessment: Kyphotic  Communication   Communication Communication: No apparent difficulties    Cognition Arousal: Alert Behavior During Therapy: Restless, Impulsive, Agitated   PT - Cognitive impairments: Orientation, Memory, Sequencing, Initiation, Problem solving, Safety/Judgement   Orientation impairments: Place, Time, Situation                   PT - Cognition Comments: Pt only oriented to name, unsure of DOB, day of week and month, situation, and place. Required inc time to respond, and appeared pt had trouble focusing. She kept stating I really think I should go home'' and was very eager  to get out of bed despite therapist instruction to wait. Following commands: Intact Following commands impaired: Follows one step commands inconsistently, Follows one step commands with increased time     Cueing Cueing Techniques: Verbal cues     General Comments      Exercises     Assessment/Plan    PT Assessment Patient needs continued PT services  PT Problem List Decreased strength;Decreased range of motion;Decreased activity tolerance;Decreased balance;Decreased mobility;Decreased coordination;Decreased cognition;Decreased knowledge of use of DME;Decreased safety awareness;Decreased knowledge of precautions       PT Treatment Interventions DME instruction;Functional mobility training;Therapeutic activities;Stair training;Gait training;Therapeutic exercise;Balance training;Neuromuscular re-education;Cognitive remediation;Patient/family education;Wheelchair mobility training;Modalities;Manual techniques    PT Goals (Current goals can be found in the Care Plan section)  Acute Rehab PT Goals Patient Stated Goal: to go home PT Goal Formulation: With patient/family Time For Goal Achievement: 08/02/24 Potential to Achieve Goals: Fair    Frequency Min 2X/week     Co-evaluation               AM-PAC PT 6 Clicks Mobility  Outcome Measure Help needed turning from your back to your side while in a flat bed without using  bedrails?: A Lot Help needed moving from lying on your back to sitting on the side of a flat bed without using bedrails?: A Lot Help needed moving to and from a bed to a chair (including a wheelchair)?: A Lot Help needed standing up from a chair using your arms (e.g., wheelchair or bedside chair)?: A Lot Help needed to walk in hospital room?: Total Help needed climbing 3-5 steps with a railing? : Total 6 Click Score: 10    End of Session Equipment Utilized During Treatment: Gait belt Activity Tolerance: Patient tolerated treatment well Patient left: in  chair;with call bell/phone within reach;with chair alarm set Nurse Communication: Mobility status;Need for lift equipment PT Visit Diagnosis: Other abnormalities of gait and mobility (R26.89);Muscle weakness (generalized) (M62.81);History of falling (Z91.81);Pain Pain - Right/Left: Left Pain - part of body: Ankle and joints of foot    Time: 1030-1108 PT Time Calculation (min) (ACUTE ONLY): 38 min   Charges:   PT Evaluation $PT Eval Moderate Complexity: 1 Mod PT Treatments $Therapeutic Activity: 23-37 mins PT General Charges $$ ACUTE PT VISIT: 1 Visit         Tikisha Molinaro, SPT   Carlisa Eble 07/30/2024, 2:21 PM

## 2024-07-30 NOTE — Plan of Care (Signed)
  Problem: Education: Goal: Ability to describe self-care measures that may prevent or decrease complications (Diabetes Survival Skills Education) will improve Outcome: Progressing Goal: Individualized Educational Video(s) Outcome: Progressing   Problem: Coping: Goal: Ability to adjust to condition or change in health will improve Outcome: Progressing   Problem: Fluid Volume: Goal: Ability to maintain a balanced intake and output will improve Outcome: Progressing   Problem: Health Behavior/Discharge Planning: Goal: Ability to identify and utilize available resources and services will improve Outcome: Progressing Goal: Ability to manage health-related needs will improve Outcome: Progressing   Problem: Metabolic: Goal: Ability to maintain appropriate glucose levels will improve Outcome: Progressing   Problem: Nutritional: Goal: Maintenance of adequate nutrition will improve Outcome: Progressing Goal: Progress toward achieving an optimal weight will improve Outcome: Progressing   Problem: Skin Integrity: Goal: Risk for impaired skin integrity will decrease Outcome: Progressing   Problem: Tissue Perfusion: Goal: Adequacy of tissue perfusion will improve Outcome: Progressing   Problem: Education: Goal: Knowledge of General Education information will improve Description: Including pain rating scale, medication(s)/side effects and non-pharmacologic comfort measures Outcome: Progressing   Problem: Health Behavior/Discharge Planning: Goal: Ability to manage health-related needs will improve Outcome: Progressing   Problem: Clinical Measurements: Goal: Ability to maintain clinical measurements within normal limits will improve Outcome: Progressing Goal: Will remain free from infection Outcome: Progressing Goal: Diagnostic test results will improve Outcome: Progressing Goal: Respiratory complications will improve Outcome: Progressing Goal: Cardiovascular complication will  be avoided Outcome: Progressing   Problem: Activity: Goal: Risk for activity intolerance will decrease Outcome: Progressing   Problem: Nutrition: Goal: Adequate nutrition will be maintained Outcome: Progressing   Problem: Coping: Goal: Level of anxiety will decrease Outcome: Progressing   Problem: Elimination: Goal: Will not experience complications related to bowel motility Outcome: Progressing Goal: Will not experience complications related to urinary retention Outcome: Progressing   Problem: Pain Managment: Goal: General experience of comfort will improve and/or be controlled Outcome: Progressing   Problem: Safety: Goal: Ability to remain free from injury will improve Outcome: Progressing   Problem: Skin Integrity: Goal: Risk for impaired skin integrity will decrease Outcome: Progressing   Problem: Safety: Goal: Non-violent Restraint(s) Outcome: Progressing   Problem: Safety: Goal: Non-violent Restraint(s) Outcome: Progressing

## 2024-07-30 NOTE — Plan of Care (Signed)
  Problem: Education: Goal: Ability to describe self-care measures that may prevent or decrease complications (Diabetes Survival Skills Education) will improve Outcome: Progressing   Problem: Coping: Goal: Ability to adjust to condition or change in health will improve Outcome: Progressing   Problem: Health Behavior/Discharge Planning: Goal: Ability to identify and utilize available resources and services will improve Outcome: Progressing   Problem: Metabolic: Goal: Ability to maintain appropriate glucose levels will improve Outcome: Progressing   Problem: Nutritional: Goal: Maintenance of adequate nutrition will improve Outcome: Progressing   Problem: Skin Integrity: Goal: Risk for impaired skin integrity will decrease Outcome: Progressing   Problem: Tissue Perfusion: Goal: Adequacy of tissue perfusion will improve Outcome: Progressing   Problem: Education: Goal: Knowledge of General Education information will improve Description: Including pain rating scale, medication(s)/side effects and non-pharmacologic comfort measures Outcome: Progressing   Problem: Health Behavior/Discharge Planning: Goal: Ability to manage health-related needs will improve Outcome: Progressing   Problem: Clinical Measurements: Goal: Ability to maintain clinical measurements within normal limits will improve Outcome: Progressing Goal: Diagnostic test results will improve Outcome: Progressing Goal: Respiratory complications will improve Outcome: Progressing   Problem: Nutrition: Goal: Adequate nutrition will be maintained Outcome: Progressing   Problem: Coping: Goal: Level of anxiety will decrease Outcome: Progressing   Problem: Pain Managment: Goal: General experience of comfort will improve and/or be controlled Outcome: Progressing   Problem: Safety: Goal: Ability to remain free from injury will improve 07/30/2024 1423 by Con Marylen SAUNDERS, RN Outcome: Progressing 07/30/2024 0917  by Con Marylen SAUNDERS, RN Outcome: Progressing   Problem: Skin Integrity: Goal: Risk for impaired skin integrity will decrease Outcome: Progressing   Problem: Safety: Goal: Non-violent Restraint(s) Outcome: Progressing   Problem: Safety: Goal: Non-violent Restraint(s) Outcome: Progressing

## 2024-07-30 NOTE — Plan of Care (Signed)
  Problem: Health Behavior/Discharge Planning: Goal: Ability to manage health-related needs will improve Outcome: Progressing   Problem: Metabolic: Goal: Ability to maintain appropriate glucose levels will improve Outcome: Progressing   Problem: Tissue Perfusion: Goal: Adequacy of tissue perfusion will improve Outcome: Progressing   Problem: Clinical Measurements: Goal: Ability to maintain clinical measurements within normal limits will improve Outcome: Progressing Goal: Cardiovascular complication will be avoided Outcome: Progressing

## 2024-07-30 NOTE — Plan of Care (Signed)
  Problem: Education: Goal: Ability to describe self-care measures that may prevent or decrease complications (Diabetes Survival Skills Education) will improve Outcome: Progressing   Problem: Coping: Goal: Ability to adjust to condition or change in health will improve Outcome: Progressing   Problem: Health Behavior/Discharge Planning: Goal: Ability to identify and utilize available resources and services will improve Outcome: Progressing   Problem: Metabolic: Goal: Ability to maintain appropriate glucose levels will improve Outcome: Progressing   Problem: Nutritional: Goal: Maintenance of adequate nutrition will improve Outcome: Progressing   Problem: Skin Integrity: Goal: Risk for impaired skin integrity will decrease Outcome: Progressing   Problem: Tissue Perfusion: Goal: Adequacy of tissue perfusion will improve Outcome: Progressing   Problem: Education: Goal: Knowledge of General Education information will improve Description: Including pain rating scale, medication(s)/side effects and non-pharmacologic comfort measures Outcome: Progressing   Problem: Health Behavior/Discharge Planning: Goal: Ability to manage health-related needs will improve Outcome: Progressing   Problem: Clinical Measurements: Goal: Ability to maintain clinical measurements within normal limits will improve Outcome: Progressing Goal: Diagnostic test results will improve Outcome: Progressing Goal: Respiratory complications will improve Outcome: Progressing   Problem: Nutrition: Goal: Adequate nutrition will be maintained Outcome: Progressing   Problem: Coping: Goal: Level of anxiety will decrease Outcome: Progressing   Problem: Pain Managment: Goal: General experience of comfort will improve and/or be controlled Outcome: Progressing   Problem: Safety: Goal: Ability to remain free from injury will improve Outcome: Progressing

## 2024-07-31 DIAGNOSIS — I2602 Saddle embolus of pulmonary artery with acute cor pulmonale: Secondary | ICD-10-CM | POA: Diagnosis not present

## 2024-07-31 LAB — BASIC METABOLIC PANEL WITH GFR
Anion gap: 12 (ref 5–15)
BUN: 8 mg/dL (ref 8–23)
CO2: 21 mmol/L — ABNORMAL LOW (ref 22–32)
Calcium: 9.7 mg/dL (ref 8.9–10.3)
Chloride: 108 mmol/L (ref 98–111)
Creatinine, Ser: 0.68 mg/dL (ref 0.44–1.00)
GFR, Estimated: >60 mL/min (ref >60)
Glucose, Bld: 100 mg/dL — ABNORMAL HIGH (ref 70–99)
Potassium: 3.4 mmol/L — ABNORMAL LOW (ref 3.5–5.1)
Sodium: 141 mmol/L (ref 135–145)

## 2024-07-31 LAB — MAGNESIUM: Magnesium: 1.6 mg/dL — ABNORMAL LOW (ref 1.7–2.4)

## 2024-07-31 LAB — CBC
HCT: 34 % — ABNORMAL LOW (ref 36.0–46.0)
Hemoglobin: 11 g/dL — ABNORMAL LOW (ref 12.0–15.0)
MCH: 32.2 pg (ref 26.0–34.0)
MCHC: 32.4 g/dL (ref 30.0–36.0)
MCV: 99.4 fL (ref 80.0–100.0)
Platelets: 147 K/uL — ABNORMAL LOW (ref 150–400)
RBC: 3.42 MIL/uL — ABNORMAL LOW (ref 3.87–5.11)
RDW: 16.5 % — ABNORMAL HIGH (ref 11.5–15.5)
WBC: 7.5 K/uL (ref 4.0–10.5)
nRBC: 0 % (ref 0.0–0.2)

## 2024-07-31 LAB — GLUCOSE, CAPILLARY
Glucose-Capillary: 103 mg/dL — ABNORMAL HIGH (ref 70–99)
Glucose-Capillary: 110 mg/dL — ABNORMAL HIGH (ref 70–99)
Glucose-Capillary: 99 mg/dL (ref 70–99)
Glucose-Capillary: 96 mg/dL (ref 70–99)
Glucose-Capillary: 95 mg/dL (ref 70–99)

## 2024-07-31 LAB — PHOSPHORUS: Phosphorus: 2.5 mg/dL (ref 2.5–4.6)

## 2024-07-31 MED ORDER — MAGNESIUM SULFATE 2 GM/50ML IV SOLN
2.0000 g | Freq: Once | INTRAVENOUS | Status: AC
  Administered 2024-07-31: 2 g via INTRAVENOUS
  Filled 2024-07-31: qty 50

## 2024-07-31 MED ORDER — K PHOS MONO-SOD PHOS DI & MONO 155-852-130 MG PO TABS
500.0000 mg | ORAL_TABLET | Freq: Two times a day (BID) | ORAL | Status: AC
  Administered 2024-07-31 (×2): 500 mg via ORAL
  Filled 2024-07-31 (×2): qty 2

## 2024-07-31 MED ORDER — POTASSIUM CHLORIDE CRYS ER 20 MEQ PO TBCR
30.0000 meq | EXTENDED_RELEASE_TABLET | Freq: Four times a day (QID) | ORAL | Status: AC
  Administered 2024-07-31 (×2): 30 meq via ORAL
  Filled 2024-07-31 (×2): qty 1

## 2024-07-31 MED ORDER — SENNOSIDES-DOCUSATE SODIUM 8.6-50 MG PO TABS
1.0000 | ORAL_TABLET | Freq: Two times a day (BID) | ORAL | Status: DC
  Administered 2024-07-31 – 2024-08-06 (×11): 1 via ORAL
  Filled 2024-07-31 (×14): qty 1

## 2024-07-31 NOTE — Plan of Care (Signed)
  Problem: Education: Goal: Ability to describe self-care measures that may prevent or decrease complications (Diabetes Survival Skills Education) will improve Outcome: Progressing Goal: Individualized Educational Video(s) Outcome: Progressing   Problem: Coping: Goal: Ability to adjust to condition or change in health will improve Outcome: Progressing   Problem: Fluid Volume: Goal: Ability to maintain a balanced intake and output will improve Outcome: Progressing   Problem: Health Behavior/Discharge Planning: Goal: Ability to identify and utilize available resources and services will improve Outcome: Progressing Goal: Ability to manage health-related needs will improve Outcome: Progressing   Problem: Metabolic: Goal: Ability to maintain appropriate glucose levels will improve Outcome: Progressing   Problem: Nutritional: Goal: Maintenance of adequate nutrition will improve Outcome: Progressing Goal: Progress toward achieving an optimal weight will improve Outcome: Progressing   Problem: Skin Integrity: Goal: Risk for impaired skin integrity will decrease Outcome: Progressing   Problem: Tissue Perfusion: Goal: Adequacy of tissue perfusion will improve Outcome: Progressing   Problem: Education: Goal: Knowledge of General Education information will improve Description: Including pain rating scale, medication(s)/side effects and non-pharmacologic comfort measures Outcome: Progressing   Problem: Health Behavior/Discharge Planning: Goal: Ability to manage health-related needs will improve Outcome: Progressing   Problem: Clinical Measurements: Goal: Ability to maintain clinical measurements within normal limits will improve Outcome: Progressing Goal: Will remain free from infection Outcome: Progressing Goal: Diagnostic test results will improve Outcome: Progressing Goal: Respiratory complications will improve Outcome: Progressing Goal: Cardiovascular complication will  be avoided Outcome: Progressing   Problem: Activity: Goal: Risk for activity intolerance will decrease Outcome: Progressing   Problem: Nutrition: Goal: Adequate nutrition will be maintained Outcome: Progressing   Problem: Coping: Goal: Level of anxiety will decrease Outcome: Progressing   Problem: Elimination: Goal: Will not experience complications related to bowel motility Outcome: Progressing Goal: Will not experience complications related to urinary retention Outcome: Progressing   Problem: Pain Managment: Goal: General experience of comfort will improve and/or be controlled Outcome: Progressing   Problem: Safety: Goal: Ability to remain free from injury will improve Outcome: Progressing   Problem: Skin Integrity: Goal: Risk for impaired skin integrity will decrease Outcome: Progressing   Problem: Safety: Goal: Non-violent Restraint(s) Outcome: Progressing   Problem: Safety: Goal: Non-violent Restraint(s) Outcome: Progressing

## 2024-07-31 NOTE — Plan of Care (Signed)
  Problem: Education: Goal: Ability to describe self-care measures that may prevent or decrease complications (Diabetes Survival Skills Education) will improve Outcome: Progressing   Problem: Coping: Goal: Ability to adjust to condition or change in health will improve Outcome: Progressing   Problem: Fluid Volume: Goal: Ability to maintain a balanced intake and output will improve Outcome: Progressing   Problem: Health Behavior/Discharge Planning: Goal: Ability to identify and utilize available resources and services will improve Outcome: Progressing   Problem: Metabolic: Goal: Ability to maintain appropriate glucose levels will improve Outcome: Progressing   Problem: Nutritional: Goal: Maintenance of adequate nutrition will improve Outcome: Progressing   Problem: Skin Integrity: Goal: Risk for impaired skin integrity will decrease Outcome: Progressing   Problem: Tissue Perfusion: Goal: Adequacy of tissue perfusion will improve Outcome: Progressing   Problem: Education: Goal: Knowledge of General Education information will improve Description: Including pain rating scale, medication(s)/side effects and non-pharmacologic comfort measures Outcome: Progressing   Problem: Health Behavior/Discharge Planning: Goal: Ability to manage health-related needs will improve Outcome: Progressing   Problem: Clinical Measurements: Goal: Ability to maintain clinical measurements within normal limits will improve Outcome: Progressing   Problem: Nutrition: Goal: Adequate nutrition will be maintained Outcome: Progressing   Problem: Coping: Goal: Level of anxiety will decrease Outcome: Progressing   Problem: Elimination: Goal: Will not experience complications related to bowel motility Outcome: Progressing

## 2024-07-31 NOTE — TOC Progression Note (Signed)
 Transition of Care [facility]) - Progression Note    Patient Details  Name: Rachel Black MRN: [id number] Date of Birth: July 24, 1951  [facility] CM/SW Contact  Andrez JULIANNA George, RN Phone Number: 07/31/2024, 3:00 PM  Clinical Narrative:     Pts spouse asked to speak with IP Care management. CM met with the patients spouse and he is stating he can not care for her at home. He is asking for assistance in getting her into LT SNF. Spouse states he may be able to pay for 1-2 months of LT SNF but she is going to need LT medicaid.  LCSW to fax pt out for LT SNF.  IP Care management following.    Barriers to Discharge: Continued Medical Work up               Expected Discharge Plan and Services In-house Referral: Clinical Social Work     Living arrangements for the past 2 months: Single Family Home                                       Social Drivers of Health (SDOH) Interventions SDOH Screenings   Food Insecurity: No Food Insecurity (07/27/2024)  Housing: Low Risk  (07/27/2024)  Transportation Needs: No Transportation Needs (07/27/2024)  Utilities: Not At Risk (07/27/2024)  Depression (PHQ2-9): High Risk (07/27/2024)  Social Connections: Moderately Integrated (07/27/2024)  Tobacco Use: Low Risk  (07/27/2024)    Readmission Risk Interventions    03/28/2024   10:35 AM  Readmission Risk Prevention Plan  Transportation Screening Complete  PCP or Specialist Appt within 5-7 Days Complete  Home Care Screening Complete  Medication Review (RN CM) Complete

## 2024-07-31 NOTE — TOC Progression Note (Signed)
 Transition of Care [facility]) - Progression Note    Patient Details  Name: Rachel Black MRN: [id number] Date of Birth: 07-14-1951  Transition of Care [facility]) CM/SW Contact  Nola Devere Hands, RN Phone Number: 07/31/2024, 11:17 AM  Clinical Narrative:    Case Manager received a call from [place], [place] with [employer]: [phone] ext. [phone]. She states that patient is not safe for home, husband unable to care for her, she was bedbound for 2 weeks prior. Nathanel states she filed an APS report and that they sent a Child psychotherapist to visit. She states that patient being home is a very unsafe situation, and she knows that husband can not afford to private pay for SNF. SABRA Case Manager informed Nathanel that this information will be shared with Social worker and MD. Case Manager discussed with Social Worker assigned. ICM Team will continue to follow.      Barriers to Discharge: Continued Medical Work up               Expected Discharge Plan and Services In-house Referral: Clinical Social Work     Living arrangements for the past 2 months: Single Family Home                                       Social Drivers of Health (SDOH) Interventions SDOH Screenings   Food Insecurity: No Food Insecurity (07/27/2024)  Housing: Low Risk  (07/27/2024)  Transportation Needs: No Transportation Needs (07/27/2024)  Utilities: Not At Risk (07/27/2024)  Depression (PHQ2-9): High Risk (07/27/2024)  Social Connections: Moderately Integrated (07/27/2024)  Tobacco Use: Low Risk  (07/27/2024)    Readmission Risk Interventions    03/28/2024   10:35 AM  Readmission Risk Prevention Plan  Transportation Screening Complete  PCP or Specialist Appt within 5-7 Days Complete  Home Care Screening Complete  Medication Review (RN CM) Complete

## 2024-07-31 NOTE — [place] FL2 (Signed)
 [place]  MEDICAID FL2 LEVEL OF CARE FORM     IDENTIFICATION  Patient Name: Rachel Black Birthdate: 1951/04/21 Sex: female Admission Date (Current Location): 07/27/2024  [facility] and [place] Number:  Producer, Television/film/video and Address:  [facility], [address], [place], [place] [address]      Provider Number: [id number]  Attending Physician Name and Address:  Elgergawy, Brayton RAMAN, MD  Relative Name and Phone Number:       Current Level of Care: Hospital Recommended Level of Care: Skilled Nursing Facility Prior Approval Number:    Date Approved/Denied:   PASRR Number: [id number]  Discharge Plan: SNF    Current Diagnoses: Patient Active Problem List   Diagnosis Date Noted   MDD (major depressive disorder) 07/27/2024   Physical debility 07/27/2024   pt request for ED evaluation 07/27/2024   Acute pulmonary embolism (HCC) 07/27/2024   Left trimalleolar fracture 03/19/2024   Fall at home, initial encounter 03/19/2024   Macrocytic anemia 03/19/2024   Anxiety 01/31/2023   CAD S/P percutaneous coronary angioplasty 01/14/2023   Atopic dermatitis 11/01/2022   Inflammatory polyarthropathy (HCC) 11/01/2022   Palpitations 10/25/2022   Irritable bowel syndrome with constipation 03/16/2022   Influenza B 09/16/2021   GERD without esophagitis 11/26/2020   Generalized abdominal pain 11/26/2020   Family history of breast cancer in mother 11/27/2019   Cataract of both eyes 11/27/2019   Coronary artery disease involving native coronary artery of native heart without angina pectoris 02/18/2018   NSTEMI (non-ST elevated myocardial infarction) (HCC)    PFO (patent foramen ovale)    H/O: CVA (cerebrovascular accident) 03/14/2017   Elevated LFTs    Osteoarthritis of right knee 08/23/2015   Status post total left knee replacement 08/23/2015   Arthritis of knee, right 04/20/2014   Status post total right knee replacement 04/20/2014   Flushing reaction  03/10/2012   Type 2 diabetes mellitus with other specified complication (HCC) 02/17/2007   GOUT 02/17/2007   Severe obesity (BMI >= 40) (HCC) 02/17/2007   Mixed hyperlipidemia 02/17/2007   Hypertension associated with diabetes (HCC) 02/17/2007    Orientation RESPIRATION BLADDER Height & Weight     Self  Normal Incontinent Weight: 192 lb 14.4 oz (87.5 kg) Height:     BEHAVIORAL SYMPTOMS/MOOD NEUROLOGICAL BOWEL NUTRITION STATUS      Incontinent Diet (heart healthy)  AMBULATORY STATUS COMMUNICATION OF NEEDS Skin   Extensive Assist Verbally Normal                       Personal Care Assistance Level of Assistance  Bathing, Feeding, Dressing Bathing Assistance: Maximum assistance Feeding assistance: Limited assistance Dressing Assistance: Maximum assistance     Functional Limitations Info             SPECIAL CARE FACTORS FREQUENCY                       Contractures Contractures Info: Not present    Additional Factors Info  Code Status, Allergies, Psychotropic, Insulin  Sliding Scale Code Status Info: Full Allergies Info: Other (Band-aids cause skin irritation), Penicillins Psychotropic Info: Cymbalta  60mg  daily, Seroquel 50mg  daily Insulin  Sliding Scale Info: see DC Summary       Current Medications (07/31/2024):  This is the current hospital active medication list Current Facility-Administered Medications  Medication Dose Route Frequency Provider Last Rate Last Admin   acetaminophen  (TYLENOL ) tablet 325 mg  325 mg Oral Q6H PRN Elgergawy,  Dawood S, MD   325 mg at 07/29/24 2024   Or   acetaminophen  (TYLENOL ) suppository 650 mg  650 mg Rectal Q6H PRN Elgergawy, Dawood S, MD       allopurinol  (ZYLOPRIM ) tablet 300 mg  300 mg Oral BID Patel, Ekta V, MD   300 mg at 07/31/24 9165   apixaban  (ELIQUIS ) tablet 5 mg  5 mg Oral BID Hindman, Katherine G, RPH   5 mg at 07/31/24 9165   calcium -vitamin D  (OSCAL WITH D) 500-5 MG-MCG per tablet 1 tablet  1 tablet Oral BID  Elgergawy, Dawood S, MD   1 tablet at 07/31/24 9165   cyanocobalamin  (VITAMIN B12) injection 1,000 mcg  1,000 mcg Subcutaneous Daily Elgergawy, Dawood S, MD   1,000 mcg at 07/31/24 9164   Followed by   NOREEN ON 08/04/2024] cyanocobalamin  (VITAMIN B12) injection 1,000 mcg  1,000 mcg Subcutaneous Q30 days Elgergawy, Brayton RAMAN, MD       DULoxetine  (CYMBALTA ) DR capsule 60 mg  60 mg Oral Daily Elgergawy, Dawood S, MD   60 mg at 07/31/24 0835   folic acid  (FOLVITE ) tablet 1 mg  1 mg Oral Daily Elgergawy, Dawood S, MD   1 mg at 07/31/24 0834   haloperidol lactate (HALDOL) injection 2 mg  2 mg Intravenous Q6H PRN Elgergawy, Dawood S, MD       haloperidol lactate (HALDOL) injection 5 mg  5 mg Intramuscular Q6H PRN Elgergawy, Dawood S, MD   5 mg at 07/30/24 1537   HYDROcodone -acetaminophen  (NORCO/VICODIN) 5-325 MG per tablet 1 tablet  1 tablet Oral Q6H PRN Elgergawy, Dawood S, MD   1 tablet at 07/31/24 0837   insulin  aspart (novoLOG ) injection 0-15 Units  0-15 Units Subcutaneous Q4H Tobie Mario GAILS, MD   2 Units at 07/30/24 1245   pantoprazole  (PROTONIX ) EC tablet 40 mg  40 mg Oral Daily Patel, Ekta V, MD   40 mg at 07/31/24 9165   phosphorus (K PHOS NEUTRAL) tablet 500 mg  500 mg Oral BID Elgergawy, Dawood S, MD   500 mg at 07/31/24 0835   polyethylene glycol (MIRALAX  / GLYCOLAX ) packet 17 g  17 g Oral Daily PRN Elgergawy, Dawood S, MD   17 g at 07/29/24 1333   QUEtiapine (SEROQUEL) tablet 50 mg  50 mg Oral Q24H Elgergawy, Dawood S, MD   50 mg at 07/30/24 2046   rosuvastatin  (CRESTOR ) tablet 40 mg  40 mg Oral Daily Patel, Ekta V, MD   40 mg at 07/31/24 0834   senna-docusate (Senokot-S) tablet 1 tablet  1 tablet Oral BID Elgergawy, Dawood S, MD   1 tablet at 07/31/24 0834   sodium chloride  flush (NS) 0.9 % injection 3 mL  3 mL Intravenous Q12H Tobie Mario GAILS, MD   3 mL at 07/31/24 9163     Discharge Medications: Please see discharge summary for a list of discharge medications.  Relevant Imaging  Results:  Relevant Lab Results:   Additional Information SS#: [id number]  Rachel CHRISTELLA Goodie, LCSW

## 2024-07-31 NOTE — Progress Notes (Addendum)
 Progress note   Rachel Black  FMW:[id number] DOB: [birth year] DOA: 07/27/2024 PCP: Chandra Toribio [name], MD   No chief complaint on file.   Brief Narrative:   Rachel Black is a 73 y.o. female with past medical history  of  hypertension, hyperlipidemia, CVA, DM 2, gout, chronic abdominal pain, IBS, GERD, PFO, She underwent ORIF without internal fixation of posterior malleolus and left ankle syndesmosis by Dr. Barton on 7/9, husband at bedside assists with the history, apparently she was at the SNF facility until 07/15/2024, unfortunately she was still nonambulatory,  She was seen recently in ed and found to have Left iliofemoral and distal DVT  and offered thrombectomy by vascular and pt declined on 07/19/2024. Pt was d/c with eliquis , she presents to ED secondary to generalized pain, CTA chest has been obtained, which was significant for bilateral PE with right heart strain, so she is admitted for further workup.  Assessment & Plan:   Active Problems:   Acute pulmonary embolism (HCC)  Bilateral PE with right heart strain Acute DVT in left lower extremity. - Patient has been on Eliquis  for 7 days, her workup in ED significant for bilateral PE with right heart strain. - 2D echo with preserved EF, no evidence of right heart strain, but mildly reduced ventricle systolic function with no enlargement  - Repeat venous Dopplers showing no extension or worsening of his acute left DVT . - He is on heparin  drip, given reassuring echo and Dopplers will transition back to Eliquis  (would not consider this Eliquis  failure as it was just started in outpatient setting)  - Discussed with patient that now she can work with PT/OT .  Hypertension - Blood pressure on admission, home medication including Imdur  and olmesartan  on hold, blood pressure has improved today, will resume if continues to increase .  (Remain with some significant low readings so I will hold on resuming)  Hospital delirium - She  remains significantly confused this morning, required as needed Zyprexa overnight, as well she did give her Seroquel overnight. - Continue with as needed Haldol. - Continue with delirium precaution    Anemia B12 deficiency Folic acid  deficiency -Recent workup significant for B12 and folic acid , will continue with supplements.  Deconditioning Status post ORIF 03/22/2024 for left trimalleolar fracture secondary to fall Status post ORIF 7/9, by Dr. Barton.   - Discussed with [facility] PA Ollice, she is currently weightbearing as tolerated  - Continue with calcium  and vitamin D .    Mixed hyperlipidemia  Continue with fenofibrate    GERD Continue Protonix     Hx Gout Continue allopurinol    Obesity, class I Body mass index is 31.14 kg/m.  Hypokalemia Hypomagnesemia -Mains low, continue to replace and monitor closely.  Depression - Continue with Cymbalta     DVT prophylaxis: (Heparin  drip) Code Status: (Full) Family Communication: (Discussed with husband at bedside 11/16) Disposition:   Status is: Inpatient    Consultants:  None   Subjective:  Had confusion overnight requiring as needed Zyprexa  Objective: Vitals:   07/30/24 2016 07/30/24 2349 07/31/24 0258 07/31/24 0735  BP: (!) 137/91 121/82 (!) 143/54 106/81  Pulse: 91 (!) 102 (!) 58 95  Resp: 19 17 19    Temp: 98.2 F (36.8 C) 98 F (36.7 C) (!) 97.2 F (36.2 C) (!) 97.5 F (36.4 C)  TempSrc: Oral Axillary Axillary Oral  SpO2: 96% 98%    Weight:        Intake/Output Summary (Last 24 hours) at 07/31/2024  9068 Last data filed at 07/31/2024 0303 Gross per 24 hour  Intake 180 ml  Output 1000 ml  Net -820 ml    Filed Weights   07/28/24 0500 07/29/24 0456 07/30/24 0515  Weight: 87.3 kg 98.1 kg 87.5 kg    Examination:  Awake, alert, confused Good order entry RRR,No Gallops,Rubs or new Murmurs, No Parasternal Heave +ve B.Sounds, Abd Soft, No tenderness, No rebound - guarding or  rigidity. No Cyanosis, Clubbing or edema, No new Rash or bruise        Data Reviewed: I have personally reviewed following labs and imaging studies  CBC: Recent Labs  Lab 07/27/24 1250 07/28/24 0352 07/29/24 0227 07/30/24 0609 07/31/24 0235  WBC 7.7 6.5 5.9 5.7 7.5  HGB 13.8 10.4* 10.5* 11.9* 11.0*  HCT 42.8 32.8* 34.0* 36.5 34.0*  MCV 99.3 99.1 103.3* 99.7 99.4  PLT 253 180 172 154 147*    Basic Metabolic Panel: Recent Labs  Lab 07/27/24 1250 07/28/24 0352 07/29/24 0605 07/30/24 0609 07/31/24 0235  NA 142 142 141 141 141  K 3.9 2.9* 3.4* 3.8 3.4*  CL 107 108 110 109 108  CO2 21* 23 23 21* 21*  GLUCOSE 108* 84 83 80 100*  BUN 12 10 9 8 8   CREATININE 0.88 0.85 0.75 0.76 0.68  CALCIUM  9.7 8.9 8.6* 9.2 9.7  MG  --   --  1.2* 2.0 1.6*  PHOS  --   --  2.6 1.9* 2.5    GFR: Estimated Creatinine Clearance: 70.8 mL/min (by C-G formula based on SCr of 0.68 mg/dL).  Liver Function Tests: Recent Labs  Lab 07/27/24 1250 07/28/24 0352  AST 21 14*  ALT 12 9  ALKPHOS 63 48  BILITOT 1.0 0.8  PROT 5.9* 4.3*  ALBUMIN  3.1* 2.2*    CBG: Recent Labs  Lab 07/30/24 1702 07/30/24 2003 07/30/24 2343 07/31/24 0301 07/31/24 0739  GLUCAP 116* 97 103* 103* 110*     No results found for this or any previous visit (from the past 240 hours).        Radiology Studies: No results found.       Scheduled Meds:  allopurinol   300 mg Oral BID   apixaban   5 mg Oral BID   calcium -vitamin D   1 tablet Oral BID   cyanocobalamin   1,000 mcg Subcutaneous Daily   Followed by   NOREEN ON 08/04/2024] cyanocobalamin   1,000 mcg Subcutaneous Q30 days   DULoxetine   60 mg Oral Daily   folic acid   1 mg Oral Daily   insulin  aspart  0-15 Units Subcutaneous Q4H   pantoprazole   40 mg Oral Daily   phosphorus  500 mg Oral BID   potassium chloride   30 mEq Oral Q6H   QUEtiapine  50 mg Oral Q24H   rosuvastatin   40 mg Oral Daily   senna-docusate  1 tablet Oral BID   sodium  chloride flush  3 mL Intravenous Q12H   Continuous Infusions:  magnesium  sulfate bolus IVPB 2 g (07/31/24 0832)      LOS: 4 days       Brayton Lye, MD [facility]   To contact the attending provider between 7A-7P or the covering provider during after hours 7P-7A, please log into the web site [email or web address] and access using universal [place] password for that web site. If you do not have the password, please call the hospital operator.  07/31/2024, 9:31 AM

## 2024-08-01 DIAGNOSIS — I2602 Saddle embolus of pulmonary artery with acute cor pulmonale: Secondary | ICD-10-CM | POA: Diagnosis not present

## 2024-08-01 LAB — CBC
HCT: 36.2 % (ref 36.0–46.0)
Hemoglobin: 11.4 g/dL — ABNORMAL LOW (ref 12.0–15.0)
MCH: 31.8 pg (ref 26.0–34.0)
MCHC: 31.5 g/dL (ref 30.0–36.0)
MCV: 100.8 fL — ABNORMAL HIGH (ref 80.0–100.0)
Platelets: 140 K/uL — ABNORMAL LOW (ref 150–400)
RBC: 3.59 MIL/uL — ABNORMAL LOW (ref 3.87–5.11)
RDW: 16.9 % — ABNORMAL HIGH (ref 11.5–15.5)
WBC: 7.3 K/uL (ref 4.0–10.5)
nRBC: 0 % (ref 0.0–0.2)

## 2024-08-01 LAB — GLUCOSE, CAPILLARY
Glucose-Capillary: 119 mg/dL — ABNORMAL HIGH (ref 70–99)
Glucose-Capillary: 120 mg/dL — ABNORMAL HIGH (ref 70–99)
Glucose-Capillary: 78 mg/dL (ref 70–99)
Glucose-Capillary: 95 mg/dL (ref 70–99)
Glucose-Capillary: 96 mg/dL (ref 70–99)
Glucose-Capillary: 99 mg/dL (ref 70–99)

## 2024-08-01 LAB — BASIC METABOLIC PANEL WITH GFR
Anion gap: 8 (ref 5–15)
BUN: 9 mg/dL (ref 8–23)
CO2: 26 mmol/L (ref 22–32)
Calcium: 9.8 mg/dL (ref 8.9–10.3)
Chloride: 108 mmol/L (ref 98–111)
Creatinine, Ser: 1 mg/dL (ref 0.44–1.00)
GFR, Estimated: 60 mL/min — ABNORMAL LOW (ref >60)
Glucose, Bld: 109 mg/dL — ABNORMAL HIGH (ref 70–99)
Potassium: 4.7 mmol/L (ref 3.5–5.1)
Sodium: 142 mmol/L (ref 135–145)

## 2024-08-01 LAB — MAGNESIUM: Magnesium: 1.9 mg/dL (ref 1.7–2.4)

## 2024-08-01 LAB — PHOSPHORUS: Phosphorus: 4.2 mg/dL (ref 2.5–4.6)

## 2024-08-01 MED ORDER — QUETIAPINE FUMARATE 25 MG PO TABS
25.0000 mg | ORAL_TABLET | ORAL | Status: DC
  Administered 2024-08-01 – 2024-08-06 (×6): 25 mg via ORAL
  Filled 2024-08-01 (×6): qty 1

## 2024-08-01 NOTE — TOC Progression Note (Signed)
 Transition of Care [facility]) - Progression Note    Patient Details  Name: Rachel Black MRN: [id number] Date of Birth: Jan 28, 1951  [facility] CM/SW Contact  Inocente GORMAN Kindle, LCSW Phone Number: 08/01/2024, 11:40 AM  Clinical Narrative:    No bed offers at this time. CSW requested [place] with [facility] review for private pay.    Expected Discharge Plan: Skilled Nursing Facility Barriers to Discharge: Continued Medical Work up, Inadequate or no insurance, SNF Pending bed offer               Expected Discharge Plan and Services In-house Referral: Clinical Social Work     Living arrangements for the past 2 months: Single Family Home                                       Social Drivers of Health (SDOH) Interventions SDOH Screenings   Food Insecurity: No Food Insecurity (07/27/2024)  Housing: Low Risk  (07/27/2024)  Transportation Needs: No Transportation Needs (07/27/2024)  Utilities: Not At Risk (07/27/2024)  Depression (PHQ2-9): High Risk (07/27/2024)  Social Connections: Moderately Integrated (07/27/2024)  Tobacco Use: Low Risk  (07/27/2024)    Readmission Risk Interventions    03/28/2024   10:35 AM  Readmission Risk Prevention Plan  Transportation Screening Complete  PCP or Specialist Appt within 5-7 Days Complete  Home Care Screening Complete  Medication Review (RN CM) Complete

## 2024-08-01 NOTE — Progress Notes (Signed)
 Progress note   Rachel Black  FMW:[id number] DOB: 1951/06/05 DOA: 07/27/2024 PCP: Chandra Toribio Rachel Black   No chief complaint on file.   Brief Narrative:   Rachel Black is a 73 y.o. female with past medical history  of  hypertension, hyperlipidemia, CVA, DM 2, gout, chronic abdominal pain, IBS, GERD, PFO, She underwent ORIF without internal fixation of posterior malleolus and left ankle syndesmosis by Dr. Barton on 7/9, husband at bedside assists with the history, apparently she was at the SNF facility until 07/15/2024, unfortunately she was still nonambulatory,  She was seen recently in ed and found to have Left iliofemoral and distal DVT  and offered thrombectomy by vascular and pt declined on 07/19/2024. Pt was d/c with eliquis , she presents to ED secondary to generalized pain, CTA chest has been obtained, which was significant for bilateral PE with right heart strain, so she is admitted for further workup.  Assessment & Plan:   Active Problems:   Acute pulmonary embolism (HCC)  Bilateral PE with right heart strain Acute DVT in left lower extremity. - Patient has been on Eliquis  for 7 days, her workup in ED significant for bilateral PE with right heart strain. - 2D echo with preserved EF, no evidence of right heart strain, but mildly reduced ventricle systolic function with no enlargement  - Repeat venous Dopplers showing no extension or worsening of his acute left DVT . - She initially on heparin  drip , given reassuring echo and Dopplers will transition back to Eliquis  (would not consider this Eliquis  failure as it was just started in outpatient setting)  - Discussed with patient that now she can work with PT/OT .  Hypertension - Blood pressure on admission, home medication including Imdur  and olmesartan  on hold, blood pressure has improved today, will resume if continues to increase .  (Remain with some significant low readings so I will hold on resuming)  Hospital  delirium - She remains significantly confused this morning, required as needed Zyprexa overnight, as well she did give her Seroquel overnight. - Continue with as needed Haldol. - Continue with delirium precaution - She remains somnolent this morning, so I will decrease her Seroquel from 50 to 25 mg at 8 PM    Anemia B12 deficiency Folic acid  deficiency -Recent workup significant for B12 and folic acid , will continue with supplements.  Deconditioning Status post ORIF 03/22/2024 for left trimalleolar fracture secondary to fall Status post ORIF 7/9, by Dr. Barton.   - Discussed with [facility] PA Ollice, she is currently weightbearing as tolerated  - Continue with calcium  and vitamin D .    Mixed hyperlipidemia  Continue with fenofibrate    GERD Continue Protonix     Hx Gout Continue allopurinol    Obesity, class I Body mass index is 31.14 kg/m.  Hypokalemia Hypomagnesemia -Mains low, continue to replace and monitor closely.  Depression - Continue with Cymbalta     DVT prophylaxis: (Heparin  drip) Code Status: (Full) Family Communication: (Discussed with husband at bedside 11/16, 11/18) Disposition:   Status is: Inpatient    Consultants:  None   Subjective:  Still required wrist restraints overnight as he was trying to get out of bed,  Objective: Vitals:   08/01/24 0400 08/01/24 0726 08/01/24 1150 08/01/24 1540  BP: 95/61 111/64 113/83 (!) 133/100  Pulse: 75 75 66 79  Resp: 20     Temp: 98.3 F (36.8 C) 98.5 F (36.9 C) (!) 97.5 F (36.4 C) 99 F (37.2 C)  TempSrc: Oral  Axillary Axillary Oral  SpO2:      Weight:       No intake or output data in the 24 hours ending 08/01/24 1644   Filed Weights   07/28/24 0500 07/29/24 0456 07/30/24 0515  Weight: 87.3 kg 98.1 kg 87.5 kg    Examination:  Somnolent this morning, more awake in the afternoon, but remains confused Good order entry RRR,No Gallops,Rubs or new Murmurs, No Parasternal Heave +ve  B.Sounds, Abd Soft, No tenderness, No rebound - guarding or rigidity. No Cyanosis, Clubbing or edema, No new Rash or bruise        Data Reviewed: I have personally reviewed following labs and imaging studies  CBC: Recent Labs  Lab 07/28/24 0352 07/29/24 0227 07/30/24 0609 07/31/24 0235 08/01/24 0319  WBC 6.5 5.9 5.7 7.5 7.3  HGB 10.4* 10.5* 11.9* 11.0* 11.4*  HCT 32.8* 34.0* 36.5 34.0* 36.2  MCV 99.1 103.3* 99.7 99.4 100.8*  PLT 180 172 154 147* 140*    Basic Metabolic Panel: Recent Labs  Lab 07/28/24 0352 07/29/24 0605 07/30/24 0609 07/31/24 0235 08/01/24 0319  NA 142 141 141 141 142  K 2.9* 3.4* 3.8 3.4* 4.7  CL 108 110 109 108 108  CO2 23 23 21* 21* 26  GLUCOSE 84 83 80 100* 109*  BUN 10 9 8 8 9   CREATININE 0.85 0.75 0.76 0.68 1.00  CALCIUM  8.9 8.6* 9.2 9.7 9.8  MG  --  1.2* 2.0 1.6* 1.9  PHOS  --  2.6 1.9* 2.5 4.2    GFR: Estimated Creatinine Clearance: 56.7 mL/min (by C-G formula based on SCr of 1 mg/dL).  Liver Function Tests: Recent Labs  Lab 07/27/24 1250 07/28/24 0352  AST 21 14*  ALT 12 9  ALKPHOS 63 48  BILITOT 1.0 0.8  PROT 5.9* 4.3*  ALBUMIN  3.1* 2.2*    CBG: Recent Labs  Lab 08/01/24 0006 08/01/24 0409 08/01/24 0729 08/01/24 1150 08/01/24 1541  GLUCAP 120* 99 119* 95 78     No results found for this or any previous visit (from the past 240 hours).        Radiology Studies: No results found.       Scheduled Meds:  allopurinol   300 mg Oral BID   apixaban   5 mg Oral BID   calcium -vitamin D   1 tablet Oral BID   cyanocobalamin   1,000 mcg Subcutaneous Daily   Followed by   NOREEN ON 08/04/2024] cyanocobalamin   1,000 mcg Subcutaneous Q30 days   DULoxetine   60 mg Oral Daily   folic acid   1 mg Oral Daily   insulin  aspart  0-15 Units Subcutaneous Q4H   pantoprazole   40 mg Oral Daily   QUEtiapine  25 mg Oral Q24H   rosuvastatin   40 mg Oral Daily   senna-docusate  1 tablet Oral BID   sodium chloride  flush  3 mL  Intravenous Q12H   Continuous Infusions:      LOS: 5 days       Rachel Lye, Black [facility]   To contact the attending provider between 7A-7P or the covering provider during after hours 7P-7A, please log into the web site [email or web address] and access using universal  password for that web site. If you do not have the password, please call the hospital operator.  08/01/2024, 4:44 PM

## 2024-08-01 NOTE — Progress Notes (Incomplete)
 PROGRESS NOTE        PATIENT DETAILS Name: Rachel Black Age: 73 y.o. Sex: female Date of Birth: 1950/10/09 Admit Date: 07/27/2024 Admitting Physician Mario Tobie GAILS, MD ERE:Nodnw, Toribio [name], MD  Brief Summary: Patient is a 73 y.o.  female who sustained a left trimalleolar ankle fracture-s/p ORIF on 7/9-subsequently discharged to SNF-when she was nonambulatory-recently seen in the ED on 11/5 for LLE DVT (refused thrombectomy) discharged back to SNF on Eliquis -presented to the ED on 11/13 with generalized pain-CTA chest showed submassive pulmonary embolism..  Significant events: 11/13>> admit to [facility]  Significant studies: 11/13>> CT abdomen/pelvis: B/L pulmonary emboli-no evidence of right heart strain.  Stable low-density within the left external iliac vein suspicious for thrombus. 11/13>> CTA chest: B/L pulmonary emboli with right heart strain.  Mild cardiomegaly. 11/14>> B/L LE Doppler:+ve DVT in multiple veins in LLE. 11/14>> echo: EF 50-55%, regional wall motion abnormalities, RV systolic function is low normal.  Significant microbiology data: None  Procedures: None  Consults: None  Subjective: Lying comfortably in bed-denies any chest pain or shortness of breath.  Objective: Vitals: Blood pressure 119/71, pulse 79, temperature 98.8 F (37.1 C), temperature source Oral, resp. rate 18, weight 87.5 kg, SpO2 99%.   Exam: Gen Exam:Alert awake-not in any distress HEENT:atraumatic, normocephalic Chest: B/L clear to auscultation anteriorly CVS:S1S2 regular Abdomen:soft non tender, non distended Extremities:no edema Neurology: Non focal Skin: no rash  Pertinent Labs/Radiology:    Latest Ref Rng & Units 08/01/2024    3:19 AM 07/31/2024    2:35 AM 07/30/2024    6:09 AM  CBC  WBC 4.0 - 10.5 K/uL 7.3  7.5  5.7   Hemoglobin 12.0 - 15.0 g/dL 88.5  88.9  88.0   Hematocrit 36.0 - 46.0 % 36.2  34.0  36.5   Platelets 150 - 400 K/uL 140  147  154      Lab Results  Component Value Date   NA 142 08/01/2024   K 4.7 08/01/2024   CL 108 08/01/2024   CO2 26 08/01/2024      Assessment/Plan: Submassive PE LLE DVT Provoked VTE in the setting of recent left ankle fracture and nonambulatory status Hemodynamically stable Initially on IV heparin -has been transitioned back to Eliquis .  Delirium Seroquel dosage decreased to 25 mg due to excessive somnolence Delirium precautions  Recent left ankle fracture-s/p ORIF 7/9 WBAT Mobilize is much as possible with PT/OT  HTN BP stable Imdur /olmesartan  on hold-resume when able.  HLD Fenofibrate   Gout Allopurinol   GERD PPI  Vitamin B12/folic acid  deficiency (B12 55 on 7/7, folic acid  4.8 on 7/7) Continue supplementation Recheck levels in the next several weeks.  Mood disorder Cymbalta   Class 1 Obesity: Estimated body mass index is 31.14 kg/m as calculated from the following:   Height as of an earlier encounter on 07/27/24: 5' 6 (1.676 m).   Weight as of this encounter: 87.5 kg.   Code status:   Code Status: Full Code   DVT Prophylaxis: apixaban  (ELIQUIS ) tablet 5 mg     Family Communication: None at bedside   Disposition Plan: Status is: Inpatient Remains inpatient appropriate because: Severity of illness   Planned Discharge Destination:Skilled nursing facility   Diet: Diet Order             Diet Heart Room service appropriate? Yes; Fluid consistency: Thin  Diet effective  now                     Antimicrobial agents: Anti-infectives (From admission, onward)    None        MEDICATIONS: Scheduled Meds:  allopurinol   300 mg Oral BID   apixaban   5 mg Oral BID   calcium -vitamin D   1 tablet Oral BID   cyanocobalamin   1,000 mcg Subcutaneous Daily   Followed by   NOREEN ON 08/04/2024] cyanocobalamin   1,000 mcg Subcutaneous Q30 days   DULoxetine   60 mg Oral Daily   folic acid   1 mg Oral Daily   insulin  aspart  0-15 Units Subcutaneous Q4H    pantoprazole   40 mg Oral Daily   QUEtiapine  25 mg Oral Q24H   rosuvastatin   40 mg Oral Daily   senna-docusate  1 tablet Oral BID   sodium chloride  flush  3 mL Intravenous Q12H   Continuous Infusions: PRN Meds:.acetaminophen  **OR** acetaminophen , haloperidol lactate, haloperidol lactate, HYDROcodone -acetaminophen , polyethylene glycol   I have personally reviewed following labs and imaging studies  LABORATORY DATA: CBC: Recent Labs  Lab 07/28/24 0352 07/29/24 0227 07/30/24 0609 07/31/24 0235 08/01/24 0319  WBC 6.5 5.9 5.7 7.5 7.3  HGB 10.4* 10.5* 11.9* 11.0* 11.4*  HCT 32.8* 34.0* 36.5 34.0* 36.2  MCV 99.1 103.3* 99.7 99.4 100.8*  PLT 180 172 154 147* 140*    Basic Metabolic Panel: Recent Labs  Lab 07/28/24 0352 07/29/24 0605 07/30/24 0609 07/31/24 0235 08/01/24 0319  NA 142 141 141 141 142  K 2.9* 3.4* 3.8 3.4* 4.7  CL 108 110 109 108 108  CO2 23 23 21* 21* 26  GLUCOSE 84 83 80 100* 109*  BUN 10 9 8 8 9   CREATININE 0.85 0.75 0.76 0.68 1.00  CALCIUM  8.9 8.6* 9.2 9.7 9.8  MG  --  1.2* 2.0 1.6* 1.9  PHOS  --  2.6 1.9* 2.5 4.2    GFR: Estimated Creatinine Clearance: 56.7 mL/min (by C-G formula based on SCr of 1 mg/dL).  Liver Function Tests: Recent Labs  Lab 07/27/24 1250 07/28/24 0352  AST 21 14*  ALT 12 9  ALKPHOS 63 48  BILITOT 1.0 0.8  PROT 5.9* 4.3*  ALBUMIN  3.1* 2.2*   Recent Labs  Lab 07/27/24 1250  LIPASE 29   No results for input(s): AMMONIA in the last 168 hours.  Coagulation Profile: No results for input(s): INR, PROTIME in the last 168 hours.  Cardiac Enzymes: No results for input(s): CKTOTAL, CKMB, CKMBINDEX, TROPONINI in the last 168 hours.  BNP (last 3 results) No results for input(s): PROBNP in the last 8760 hours.  Lipid Profile: No results for input(s): CHOL, HDL, LDLCALC, TRIG, CHOLHDL, LDLDIRECT in the last 72 hours.  Thyroid  Function Tests: No results for input(s): TSH, T4TOTAL,  FREET4, T3FREE, THYROIDAB in the last 72 hours.  Anemia Panel: No results for input(s): VITAMINB12, FOLATE, FERRITIN, TIBC, IRON, RETICCTPCT in the last 72 hours.  Urine analysis:    Component Value Date/Time   COLORURINE YELLOW 07/27/2024 1726   APPEARANCEUR CLEAR 07/27/2024 1726   LABSPEC 1.044 (H) 07/27/2024 1726   PHURINE 5.0 07/27/2024 1726   GLUCOSEU NEGATIVE 07/27/2024 1726   HGBUR NEGATIVE 07/27/2024 1726   BILIRUBINUR NEGATIVE 07/27/2024 1726   BILIRUBINUR neg 12/09/2015 1701   KETONESUR NEGATIVE 07/27/2024 1726   PROTEINUR NEGATIVE 07/27/2024 1726   UROBILINOGEN 0.2 12/09/2015 1701   UROBILINOGEN 0.2 04/13/2014 1422   NITRITE NEGATIVE 07/27/2024 1726   LEUKOCYTESUR NEGATIVE  07/27/2024 1726    Sepsis Labs: Lactic Acid, Venous    Component Value Date/Time   LATICACIDVEN 1.0 07/27/2024 1628    MICROBIOLOGY: No results found for this or any previous visit (from the past 240 hours).  RADIOLOGY STUDIES/RESULTS: No results found.   LOS: 5 days   Donalda Applebaum, MD  [facility]    To contact the attending provider between 7A-7P or the covering provider during after hours 7P-7A, please log into the web site [email or web address] and access using universal [place] password for that web site. If you do not have the password, please call the hospital operator.  08/01/2024, 9:29 PM

## 2024-08-01 NOTE — Plan of Care (Signed)
  Problem: Education: Goal: Ability to describe self-care measures that may prevent or decrease complications (Diabetes Survival Skills Education) will improve Outcome: Progressing Goal: Individualized Educational Video(s) Outcome: Progressing   Problem: Coping: Goal: Ability to adjust to condition or change in health will improve Outcome: Progressing   Problem: Fluid Volume: Goal: Ability to maintain a balanced intake and output will improve Outcome: Progressing   Problem: Health Behavior/Discharge Planning: Goal: Ability to identify and utilize available resources and services will improve Outcome: Progressing Goal: Ability to manage health-related needs will improve Outcome: Progressing   Problem: Metabolic: Goal: Ability to maintain appropriate glucose levels will improve Outcome: Progressing   Problem: Nutritional: Goal: Maintenance of adequate nutrition will improve Outcome: Progressing Goal: Progress toward achieving an optimal weight will improve Outcome: Progressing   Problem: Skin Integrity: Goal: Risk for impaired skin integrity will decrease Outcome: Progressing   Problem: Tissue Perfusion: Goal: Adequacy of tissue perfusion will improve Outcome: Progressing   Problem: Education: Goal: Knowledge of General Education information will improve Description: Including pain rating scale, medication(s)/side effects and non-pharmacologic comfort measures Outcome: Progressing   Problem: Health Behavior/Discharge Planning: Goal: Ability to manage health-related needs will improve Outcome: Progressing   Problem: Clinical Measurements: Goal: Ability to maintain clinical measurements within normal limits will improve Outcome: Progressing Goal: Will remain free from infection Outcome: Progressing Goal: Diagnostic test results will improve Outcome: Progressing Goal: Respiratory complications will improve Outcome: Progressing Goal: Cardiovascular complication will  be avoided Outcome: Progressing   Problem: Activity: Goal: Risk for activity intolerance will decrease Outcome: Progressing   Problem: Nutrition: Goal: Adequate nutrition will be maintained Outcome: Progressing   Problem: Coping: Goal: Level of anxiety will decrease Outcome: Progressing   Problem: Elimination: Goal: Will not experience complications related to bowel motility Outcome: Progressing Goal: Will not experience complications related to urinary retention Outcome: Progressing   Problem: Pain Managment: Goal: General experience of comfort will improve and/or be controlled Outcome: Progressing   Problem: Safety: Goal: Ability to remain free from injury will improve Outcome: Progressing   Problem: Skin Integrity: Goal: Risk for impaired skin integrity will decrease Outcome: Progressing   Problem: Safety: Goal: Non-violent Restraint(s) Outcome: Progressing   Problem: Safety: Goal: Non-violent Restraint(s) Outcome: Progressing

## 2024-08-01 NOTE — Progress Notes (Signed)
 Physical Therapy Treatment Patient Details Name: Rachel Black MRN: [id number] DOB: 11-26-1950 Today's Date: 08/01/2024   History of Present Illness 73 y.o. female presents to [facility] hospital on 07/30/2024 from home with B PE and R heart strain. Pt was recently d/c from [facility] week prior, with which she was here d/t lack of sufficiency caregiver support following long term stay and d/c from SNF 11/1 (d/t L ankle fx in July 2025). PMH includes: arthritis, DM, gout, PFO, CVA, HTN, ERCP, TKA bil., CAD.    PT Comments  Pt tolerated treatment well today. Pt progress well with bed mobility only requiring CGA. Pt able to stand in in stedy for a few seconds with +2 Mod A. Pt required 2 person HHA in stedy due to shoulder limitations however transfer was deferred due to dizziness and bowel incontinence. No change in DC/DME recs at this time. PT will continue to follow.     If plan is discharge home, recommend the following: Two people to help with walking and/or transfers;A lot of help with bathing/dressing/bathroom;Assistance with cooking/housework;Direct supervision/assist for medications management;Direct supervision/assist for financial management;Assist for transportation;Supervision due to cognitive status   Can travel by private vehicle     No  Equipment Recommendations  Hospital bed;Hoyer lift    Recommendations for Other Services       Precautions / Restrictions Precautions Precautions: Fall Recall of Precautions/Restrictions: Impaired Restrictions Weight Bearing Restrictions Per Provider Order: Yes LLE Weight Bearing Per Provider Order: Weight bearing as tolerated     Mobility  Bed Mobility Overal bed mobility: Needs Assistance Bed Mobility: Supine to Sit, Sit to Supine     Supine to sit: Contact guard Sit to supine: Contact guard assist   General bed mobility comments: No physical assistance required today. Increased time and use of bed rails.    Transfers Overall transfer  level: Needs assistance Equipment used: Ambulation equipment used, 2 person hand held assist Transfers: Sit to/from Stand Sit to Stand: +2 physical assistance, Mod assist, Via lift equipment           General transfer comment: Pt able to stand in in stedy for a few seconds with +2 Mod A. Pt required 2 person HHA in stedy due to shoulder limitations however transfer was deferred due to dizziness and bowel incontinence. Transfer via Lift Equipment: Stedy  Ambulation/Gait               General Gait Details: Deferred due to bowel incontinence and dizziness.   Stairs             Wheelchair Mobility     Tilt Bed    Modified Rankin (Stroke Patients Only)       Balance Overall balance assessment: Needs assistance Sitting-balance support: No upper extremity supported, Feet unsupported Sitting balance-Leahy Scale: Fair     Standing balance support: Bilateral upper extremity supported, During functional activity Standing balance-Leahy Scale: Poor                              Communication Communication Communication: No apparent difficulties  Cognition Arousal: Alert Behavior During Therapy: WFL for tasks assessed/performed   PT - Cognitive impairments: Orientation, Memory, Sequencing, Initiation, Problem solving, Safety/Judgement   Orientation impairments: Place, Time, Situation                   PT - Cognition Comments: Pt appeared to be very calm today however initially thought that she was  at home before being cued that she was at the hospital. Followed commands appropriately today. Following commands: Intact      Cueing Cueing Techniques: Verbal cues  Exercises      General Comments General comments (skin integrity, edema, etc.): BP: 140/96 seated EOB.      Pertinent Vitals/Pain Pain Assessment Pain Assessment: Faces Faces Pain Scale: Hurts even more Pain Location: B shoulders Pain Descriptors / Indicators: Grimacing Pain  Intervention(s): Monitored during session, Limited activity within patient's tolerance, Repositioned    Home Living                          Prior Function            PT Goals (current goals can now be found in the care plan section) Progress towards PT goals: Progressing toward goals    Frequency    Min 2X/week      PT Plan      Co-evaluation              AM-PAC PT 6 Clicks Mobility   Outcome Measure  Help needed turning from your back to your side while in a flat bed without using bedrails?: A Lot Help needed moving from lying on your back to sitting on the side of a flat bed without using bedrails?: A Lot Help needed moving to and from a bed to a chair (including a wheelchair)?: A Lot Help needed standing up from a chair using your arms (e.g., wheelchair or bedside chair)?: A Lot Help needed to walk in hospital room?: Total Help needed climbing 3-5 steps with a railing? : Total 6 Click Score: 10    End of Session Equipment Utilized During Treatment: Gait belt Activity Tolerance: Patient tolerated treatment well Patient left: in bed;with call bell/phone within reach;with bed alarm set;with restraints reapplied;Other (comment) (On bedpan) Nurse Communication: Mobility status;Need for lift equipment PT Visit Diagnosis: Other abnormalities of gait and mobility (R26.89);Muscle weakness (generalized) (M62.81);History of falling (Z91.81);Pain Pain - Right/Left: Left Pain - part of body: Ankle and joints of foot     Time: 8562-8493 PT Time Calculation (min) (ACUTE ONLY): 29 min  Charges:    $Therapeutic Activity: 23-37 mins PT General Charges $$ ACUTE PT VISIT: 1 Visit                     Sueellen NOVAK, PT, DPT Acute Rehab Services [phone]    Thorne Wirz 08/01/2024, 3:43 PM

## 2024-08-02 DIAGNOSIS — E538 Deficiency of other specified B group vitamins: Secondary | ICD-10-CM

## 2024-08-02 DIAGNOSIS — F05 Delirium due to known physiological condition: Secondary | ICD-10-CM | POA: Diagnosis not present

## 2024-08-02 DIAGNOSIS — I2602 Saddle embolus of pulmonary artery with acute cor pulmonale: Secondary | ICD-10-CM | POA: Diagnosis not present

## 2024-08-02 LAB — CBC
HCT: 33.8 % — ABNORMAL LOW (ref 36.0–46.0)
Hemoglobin: 10.9 g/dL — ABNORMAL LOW (ref 12.0–15.0)
MCH: 32.3 pg (ref 26.0–34.0)
MCHC: 32.2 g/dL (ref 30.0–36.0)
MCV: 100.3 fL — ABNORMAL HIGH (ref 80.0–100.0)
Platelets: 152 K/uL (ref 150–400)
RBC: 3.37 MIL/uL — ABNORMAL LOW (ref 3.87–5.11)
RDW: 16.6 % — ABNORMAL HIGH (ref 11.5–15.5)
WBC: 7.9 K/uL (ref 4.0–10.5)
nRBC: 0 % (ref 0.0–0.2)

## 2024-08-02 LAB — BASIC METABOLIC PANEL WITH GFR
Anion gap: 12 (ref 5–15)
BUN: 12 mg/dL (ref 8–23)
CO2: 23 mmol/L (ref 22–32)
Calcium: 9.4 mg/dL (ref 8.9–10.3)
Chloride: 105 mmol/L (ref 98–111)
Creatinine, Ser: 0.86 mg/dL (ref 0.44–1.00)
GFR, Estimated: >60 mL/min (ref >60)
Glucose, Bld: 122 mg/dL — ABNORMAL HIGH (ref 70–99)
Potassium: 3.7 mmol/L (ref 3.5–5.1)
Sodium: 140 mmol/L (ref 135–145)

## 2024-08-02 LAB — GLUCOSE, CAPILLARY
Glucose-Capillary: 108 mg/dL — ABNORMAL HIGH (ref 70–99)
Glucose-Capillary: 113 mg/dL — ABNORMAL HIGH (ref 70–99)
Glucose-Capillary: 113 mg/dL — ABNORMAL HIGH (ref 70–99)
Glucose-Capillary: 114 mg/dL — ABNORMAL HIGH (ref 70–99)
Glucose-Capillary: 119 mg/dL — ABNORMAL HIGH (ref 70–99)
Glucose-Capillary: 121 mg/dL — ABNORMAL HIGH (ref 70–99)
Glucose-Capillary: 129 mg/dL — ABNORMAL HIGH (ref 70–99)

## 2024-08-02 LAB — MAGNESIUM: Magnesium: 1.5 mg/dL — ABNORMAL LOW (ref 1.7–2.4)

## 2024-08-02 LAB — PHOSPHORUS: Phosphorus: 3.1 mg/dL (ref 2.5–4.6)

## 2024-08-02 MED ORDER — MAGNESIUM SULFATE 2 GM/50ML IV SOLN
2.0000 g | Freq: Once | INTRAVENOUS | Status: AC
  Administered 2024-08-02: 2 g via INTRAVENOUS
  Filled 2024-08-02: qty 50

## 2024-08-02 NOTE — Plan of Care (Signed)
  Problem: Education: Goal: Ability to describe self-care measures that may prevent or decrease complications (Diabetes Survival Skills Education) will improve Outcome: Progressing Goal: Individualized Educational Video(s) Outcome: Progressing   Problem: Coping: Goal: Ability to adjust to condition or change in health will improve Outcome: Progressing   Problem: Fluid Volume: Goal: Ability to maintain a balanced intake and output will improve Outcome: Progressing   Problem: Health Behavior/Discharge Planning: Goal: Ability to identify and utilize available resources and services will improve Outcome: Progressing Goal: Ability to manage health-related needs will improve Outcome: Progressing   Problem: Metabolic: Goal: Ability to maintain appropriate glucose levels will improve Outcome: Progressing   Problem: Nutritional: Goal: Maintenance of adequate nutrition will improve Outcome: Progressing Goal: Progress toward achieving an optimal weight will improve Outcome: Progressing   Problem: Skin Integrity: Goal: Risk for impaired skin integrity will decrease Outcome: Progressing   Problem: Tissue Perfusion: Goal: Adequacy of tissue perfusion will improve Outcome: Progressing   Problem: Education: Goal: Knowledge of General Education information will improve Description: Including pain rating scale, medication(s)/side effects and non-pharmacologic comfort measures Outcome: Progressing   Problem: Health Behavior/Discharge Planning: Goal: Ability to manage health-related needs will improve Outcome: Progressing   Problem: Clinical Measurements: Goal: Ability to maintain clinical measurements within normal limits will improve Outcome: Progressing Goal: Will remain free from infection Outcome: Progressing Goal: Diagnostic test results will improve Outcome: Progressing Goal: Respiratory complications will improve Outcome: Progressing Goal: Cardiovascular complication will  be avoided Outcome: Progressing   Problem: Activity: Goal: Risk for activity intolerance will decrease Outcome: Progressing   Problem: Nutrition: Goal: Adequate nutrition will be maintained Outcome: Progressing   Problem: Coping: Goal: Level of anxiety will decrease Outcome: Progressing   Problem: Elimination: Goal: Will not experience complications related to bowel motility Outcome: Progressing Goal: Will not experience complications related to urinary retention Outcome: Progressing   Problem: Pain Managment: Goal: General experience of comfort will improve and/or be controlled Outcome: Progressing   Problem: Safety: Goal: Ability to remain free from injury will improve Outcome: Progressing   Problem: Skin Integrity: Goal: Risk for impaired skin integrity will decrease Outcome: Progressing   Problem: Safety: Goal: Non-violent Restraint(s) Outcome: Progressing   Problem: Safety: Goal: Non-violent Restraint(s) Outcome: Progressing

## 2024-08-02 NOTE — Progress Notes (Signed)
 Occupational Therapy Treatment Patient Details Name: Rachel Black MRN: [id number] DOB: [birth year] Today's Date: 08/02/2024   History of present illness 73 y.o. female presents to [facility] hospital on 07/30/2024 from home with B PE and R heart strain. Pt was recently d/c from [facility] week prior, with which she was here d/t lack of sufficiency caregiver support following long term stay and d/c from SNF 11/1 (d/t L ankle fx in July 2025). PMH includes: arthritis, DM, gout, PFO, CVA, HTN, ERCP, TKA bil., CAD.   OT comments  Pt progressing toward goals, needs mod-max +2 for pivot transfer this session to recliner on pt's L side. Difficulty weight shifting and stepping BLE (incr LLE pain with weightbearing). Pt min A for bed mobility. Once in chair, pt needing min A for self feeding, limited shoulder ROM and impaired FMC. Pt presenting with impairments listed below, will follow acutely. Patient will benefit from continued inpatient follow up therapy, <3 hours/day to maximize safety/ind with ADL/functional mobility.       If plan is discharge home, recommend the following:  Two people to help with walking and/or transfers;A lot of help with bathing/dressing/bathroom;Assistance with cooking/housework;Direct supervision/assist for medications management;Direct supervision/assist for financial management;Assist for transportation;Help with stairs or ramp for entrance   Equipment Recommendations  Other (comment) (defer)    Recommendations for Other Services PT consult    Precautions / Restrictions Precautions Precautions: Fall Recall of Precautions/Restrictions: Impaired Restrictions Weight Bearing Restrictions Per Provider Order: Yes LLE Weight Bearing Per Provider Order: Weight bearing as tolerated       Mobility Bed Mobility Overal bed mobility: Needs Assistance Bed Mobility: Supine to Sit     Supine to sit: Min assist          Transfers Overall transfer level: Needs assistance Equipment  used: Rolling walker (2 wheels) Transfers: Sit to/from Stand, Bed to chair/wheelchair/BSC Sit to Stand: Mod assist, +2 physical assistance   Squat pivot transfers: Max assist, +2 physical assistance       General transfer comment: pt with difficulty weighshifting to pivot to chair, unable to weightshit to LLE to step RLE to chair.     Balance Overall balance assessment: Needs assistance Sitting-balance support: No upper extremity supported, Feet unsupported Sitting balance-Leahy Scale: Fair     Standing balance support: Bilateral upper extremity supported, During functional activity Standing balance-Leahy Scale: Poor Standing balance comment: heavy reliance on RW and +2 assist                           ADL either performed or assessed with clinical judgement   ADL Overall ADL's : Needs assistance/impaired Eating/Feeding: Minimal assistance Eating/Feeding Details (indicate cue type and reason): set up on tray table                     Toilet Transfer: Maximal assistance;+2 for physical assistance;Rolling walker (2 wheels);Stand-pivot;BSC/3in1 Statistician Details (indicate cue type and reason): simulated bed > chair on pt's L side         Functional mobility during ADLs: Maximal assistance;+2 for physical assistance;Rolling walker (2 wheels)      Extremity/Trunk Assessment Upper Extremity Assessment Upper Extremity Assessment: Generalized weakness (shoulder ROM limited bilaterally ~45*)   Lower Extremity Assessment Lower Extremity Assessment: Defer to PT evaluation        Vision   Additional Comments: not formally assessed   Perception Perception Perception: Not tested   Praxis Praxis Praxis: Not tested  Communication Communication Communication: No apparent difficulties   Cognition Arousal: Alert Behavior During Therapy: WFL for tasks assessed/performed     Orientation impairments: Time, Situation (does not know year) Awareness:  Online awareness impaired Memory impairment (select all impairments): Short-term memory Attention impairment (select first level of impairment): Sustained attention Executive functioning impairment (select all impairments): Problem solving, Initiation                   Following commands: Intact Following commands impaired: Follows one step commands with increased time      Cueing   Cueing Techniques: Verbal cues  Exercises      Shoulder Instructions       General Comments HR up to 150s with activity, SPO2 in 90s on RA    Pertinent Vitals/ Pain       Pain Assessment Pain Assessment: Faces Pain Score: 4  Faces Pain Scale: Hurts little more Pain Location: LLE with weightbearing Pain Descriptors / Indicators: Discomfort Pain Intervention(s): Limited activity within patient's tolerance, Monitored during session, Repositioned  Home Living                                          Prior Functioning/Environment              Frequency  Min 2X/week        Progress Toward Goals  OT Goals(current goals can now be found in the care plan section)  Progress towards OT goals: Progressing toward goals  Acute Rehab OT Goals Patient Stated Goal: did not state OT Goal Formulation: With patient/family Time For Goal Achievement: 08/13/24 Potential to Achieve Goals: Good ADL Goals Pt Will Perform Upper Body Dressing: with contact guard assist;sitting Pt Will Perform Lower Body Dressing: with contact guard assist;sit to/from stand;sitting/lateral leans Pt Will Transfer to Toilet: with contact guard assist;ambulating;regular height toilet Additional ADL Goal #2: pt will follow 2 step command in prep for ADLs  Plan      Co-evaluation                 AM-PAC OT 6 Clicks Daily Activity     Outcome Measure   Help from another person eating meals?: A Little Help from another person taking care of personal grooming?: A Little Help from another  person toileting, which includes using toliet, bedpan, or urinal?: A Lot Help from another person bathing (including washing, rinsing, drying)?: A Lot Help from another person to put on and taking off regular upper body clothing?: A Lot Help from another person to put on and taking off regular lower body clothing?: A Lot 6 Click Score: 14    End of Session Equipment Utilized During Treatment: Gait belt;Rolling walker (2 wheels)  OT Visit Diagnosis: Other abnormalities of gait and mobility (R26.89);Unsteadiness on feet (R26.81);Muscle weakness (generalized) (M62.81)   Activity Tolerance Patient tolerated treatment well   Patient Left in chair;with call bell/phone within reach;with chair alarm set   Nurse Communication Mobility status        Time: 8694-8679 OT Time Calculation (min): 15 min  Charges: OT General Charges $OT Visit: 1 Visit OT Treatments $Self Care/Home Management : 8-22 mins  Arelene Moroni K, OTD, OTR/L SecureChat Preferred Acute Rehab [phone]   Laneta POUR Koonce 08/02/2024, 1:56 PM

## 2024-08-02 NOTE — Plan of Care (Signed)
  Problem: Coping: Goal: Ability to adjust to condition or change in health will improve Outcome: Progressing   Problem: Health Behavior/Discharge Planning: Goal: Ability to manage health-related needs will improve Outcome: Progressing   Problem: Nutritional: Goal: Maintenance of adequate nutrition will improve Outcome: Progressing   Problem: Tissue Perfusion: Goal: Adequacy of tissue perfusion will improve Outcome: Progressing   Problem: Education: Goal: Knowledge of General Education information will improve Description: Including pain rating scale, medication(s)/side effects and non-pharmacologic comfort measures Outcome: Progressing   Problem: Clinical Measurements: Goal: Will remain free from infection Outcome: Progressing Goal: Diagnostic test results will improve Outcome: Progressing Goal: Respiratory complications will improve Outcome: Progressing

## 2024-08-02 NOTE — Progress Notes (Signed)
   08/02/24 1353  Mobility  Activity Mechanically lifted from chair to bed  Level of Assistance Moderate assist, patient does 50-74% (+2)  Assistive Device Stedy  LLE Weight Bearing Per Provider Order WBAT  Activity Response Tolerated fair  Mobility Referral Yes  Mobility visit 1 Mobility  Mobility Specialist Start Time (ACUTE ONLY) 1353  Mobility Specialist Stop Time (ACUTE ONLY) 1359  Mobility Specialist Time Calculation (min) (ACUTE ONLY) 6 min   Mobility Specialist: Progress Note  During Mobility: HR -161-164 Post-Mobility:    HR 127  Pt agreeable to mobility session - MS assisted NT - received in bed. Pt was asymptomatic throughout session with no complaints. Returned to bed with all needs met - call bell within reach. Left with RN present.   Additional comments:  Virgle Boards, BS Mobility Specialist Please contact via SecureChat or  Rehab office at [phone].

## 2024-08-03 DIAGNOSIS — I10 Essential (primary) hypertension: Secondary | ICD-10-CM

## 2024-08-03 DIAGNOSIS — I2602 Saddle embolus of pulmonary artery with acute cor pulmonale: Secondary | ICD-10-CM | POA: Diagnosis not present

## 2024-08-03 DIAGNOSIS — E538 Deficiency of other specified B group vitamins: Secondary | ICD-10-CM | POA: Diagnosis not present

## 2024-08-03 LAB — GLUCOSE, CAPILLARY
Glucose-Capillary: 108 mg/dL — ABNORMAL HIGH (ref 70–99)
Glucose-Capillary: 114 mg/dL — ABNORMAL HIGH (ref 70–99)
Glucose-Capillary: 123 mg/dL — ABNORMAL HIGH (ref 70–99)
Glucose-Capillary: 165 mg/dL — ABNORMAL HIGH (ref 70–99)
Glucose-Capillary: 143 mg/dL — ABNORMAL HIGH (ref 70–99)

## 2024-08-03 LAB — VITAMIN B12: Vitamin B-12: 990 pg/mL — ABNORMAL HIGH (ref 180–914)

## 2024-08-03 LAB — BASIC METABOLIC PANEL WITH GFR
Anion gap: 12 (ref 5–15)
BUN: 13 mg/dL (ref 8–23)
CO2: 23 mmol/L (ref 22–32)
Calcium: 9.4 mg/dL (ref 8.9–10.3)
Chloride: 105 mmol/L (ref 98–111)
Creatinine, Ser: 0.66 mg/dL (ref 0.44–1.00)
GFR, Estimated: >60 mL/min (ref >60)
Glucose, Bld: 110 mg/dL — ABNORMAL HIGH (ref 70–99)
Potassium: 3.7 mmol/L (ref 3.5–5.1)
Sodium: 140 mmol/L (ref 135–145)

## 2024-08-03 LAB — MAGNESIUM: Magnesium: 1.7 mg/dL (ref 1.7–2.4)

## 2024-08-03 LAB — FOLATE: Folate: 15.7 ng/mL (ref >5.9)

## 2024-08-03 NOTE — TOC Progression Note (Addendum)
 Transition of Care [facility]) - Progression Note    Patient Details  Name: Rachel Black MRN: [id number] Date of Birth: 06/03/1951  [facility] of [facility]) CM/SW Contact  Inocente GORMAN Kindle, LCSW Phone Number: 08/03/2024, 4:01 PM  Clinical Narrative:    CSW inquiring on private pay bed at [facility].   Per [facility], they do have a semi private bed available at $9,331.   CSW updated patient's spouse who reported understanding that patient is medically stable to discharge once the facility and payment is established.     Expected Discharge Plan: Skilled Nursing Facility Barriers to Discharge: Continued Medical Work up, Inadequate or no insurance, SNF Pending bed offer               Expected Discharge Plan and Services In-house Referral: Clinical Social Work     Living arrangements for the past 2 months: Single Family Home                                       Social Drivers of Health (SDOH) Interventions SDOH Screenings   Food Insecurity: No Food Insecurity (07/27/2024)  Housing: Low Risk  (07/27/2024)  Transportation Needs: No Transportation Needs (07/27/2024)  Utilities: Not At Risk (07/27/2024)  Depression (PHQ2-9): High Risk (07/27/2024)  Social Connections: Moderately Integrated (07/27/2024)  Tobacco Use: Low Risk  (07/27/2024)    Readmission Risk Interventions    03/28/2024   10:35 AM  Readmission Risk Prevention Plan  Transportation Screening Complete  PCP or Specialist Appt within 5-7 Days Complete  Home Care Screening Complete  Medication Review (RN CM) Complete

## 2024-08-03 NOTE — TOC Progression Note (Signed)
 Transition of Care [facility]) - Progression Note    Patient Details  Name: Rachel Black MRN: [id number] Date of Birth: 1951-06-02  Transition of Care [facility]) CM/SW Contact  Inocente GORMAN Kindle, LCSW Phone Number: 08/03/2024, 2:09 PM  Clinical Narrative:    Remains with no SNF bed offers. Continuing search.    Expected Discharge Plan: Skilled Nursing Facility Barriers to Discharge: Continued Medical Work up, Inadequate or no insurance, SNF Pending bed offer               Expected Discharge Plan and Services In-house Referral: Clinical Social Work     Living arrangements for the past 2 months: Single Family Home                                       Social Drivers of Health (SDOH) Interventions SDOH Screenings   Food Insecurity: No Food Insecurity (07/27/2024)  Housing: Low Risk  (07/27/2024)  Transportation Needs: No Transportation Needs (07/27/2024)  Utilities: Not At Risk (07/27/2024)  Depression (PHQ2-9): High Risk (07/27/2024)  Social Connections: Moderately Integrated (07/27/2024)  Tobacco Use: Low Risk  (07/27/2024)    Readmission Risk Interventions    03/28/2024   10:35 AM  Readmission Risk Prevention Plan  Transportation Screening Complete  PCP or Specialist Appt within 5-7 Days Complete  Home Care Screening Complete  Medication Review (RN CM) Complete

## 2024-08-03 NOTE — Plan of Care (Signed)
  Problem: Education: Goal: Ability to describe self-care measures that may prevent or decrease complications (Diabetes Survival Skills Education) will improve Outcome: Progressing Goal: Individualized Educational Video(s) Outcome: Progressing   Problem: Coping: Goal: Ability to adjust to condition or change in health will improve Outcome: Progressing   Problem: Fluid Volume: Goal: Ability to maintain a balanced intake and output will improve Outcome: Progressing   Problem: Health Behavior/Discharge Planning: Goal: Ability to identify and utilize available resources and services will improve Outcome: Progressing Goal: Ability to manage health-related needs will improve Outcome: Progressing   Problem: Metabolic: Goal: Ability to maintain appropriate glucose levels will improve Outcome: Progressing   Problem: Nutritional: Goal: Maintenance of adequate nutrition will improve Outcome: Progressing Goal: Progress toward achieving an optimal weight will improve Outcome: Progressing   Problem: Skin Integrity: Goal: Risk for impaired skin integrity will decrease Outcome: Progressing   Problem: Tissue Perfusion: Goal: Adequacy of tissue perfusion will improve Outcome: Progressing   Problem: Education: Goal: Knowledge of General Education information will improve Description: Including pain rating scale, medication(s)/side effects and non-pharmacologic comfort measures Outcome: Progressing   Problem: Health Behavior/Discharge Planning: Goal: Ability to manage health-related needs will improve Outcome: Progressing   Problem: Clinical Measurements: Goal: Ability to maintain clinical measurements within normal limits will improve Outcome: Progressing Goal: Will remain free from infection Outcome: Progressing Goal: Diagnostic test results will improve Outcome: Progressing Goal: Respiratory complications will improve Outcome: Progressing Goal: Cardiovascular complication will  be avoided Outcome: Progressing   Problem: Activity: Goal: Risk for activity intolerance will decrease Outcome: Progressing   Problem: Nutrition: Goal: Adequate nutrition will be maintained Outcome: Progressing   Problem: Coping: Goal: Level of anxiety will decrease Outcome: Progressing   Problem: Elimination: Goal: Will not experience complications related to bowel motility Outcome: Progressing Goal: Will not experience complications related to urinary retention Outcome: Progressing   Problem: Pain Managment: Goal: General experience of comfort will improve and/or be controlled Outcome: Progressing   Problem: Safety: Goal: Ability to remain free from injury will improve Outcome: Progressing   Problem: Skin Integrity: Goal: Risk for impaired skin integrity will decrease Outcome: Progressing   Problem: Safety: Goal: Non-violent Restraint(s) Outcome: Progressing   Problem: Safety: Goal: Non-violent Restraint(s) Outcome: Progressing

## 2024-08-03 NOTE — Plan of Care (Signed)
  Problem: Coping: Goal: Ability to adjust to condition or change in health will improve Outcome: Progressing   Problem: Health Behavior/Discharge Planning: Goal: Ability to manage health-related needs will improve Outcome: Progressing   Problem: Metabolic: Goal: Ability to maintain appropriate glucose levels will improve Outcome: Progressing   Problem: Skin Integrity: Goal: Risk for impaired skin integrity will decrease Outcome: Progressing   Problem: Tissue Perfusion: Goal: Adequacy of tissue perfusion will improve Outcome: Progressing   Problem: Clinical Measurements: Goal: Will remain free from infection Outcome: Progressing Goal: Diagnostic test results will improve Outcome: Progressing

## 2024-08-03 NOTE — Progress Notes (Signed)
 OT Cancellation Note  Patient Details Name: Rachel Black MRN: [id number] DOB: 02/10/1951   Cancelled Treatment:    Reason Eval/Treat Not Completed: Patient declined, no reason specified  [name] 08/03/2024, 2:09 PM 08/03/2024  RP, OTR/L  Acute Rehabilitation Services  Office:  [phone]

## 2024-08-03 NOTE — Progress Notes (Signed)
 PROGRESS NOTE        PATIENT DETAILS Name: Rachel Black Age: 73 y.o. Sex: female Date of Birth: [birth year] Admit Date: 07/27/2024 Admitting Physician Mario Tobie GAILS, MD ERE:Nodnw, Toribio [name], MD  Brief Summary: Patient is a 73 y.o.  female who sustained a left trimalleolar ankle fracture-s/p ORIF on 7/9-subsequently discharged to SNF-when she was nonambulatory-recently seen in the ED on 11/5 for LLE DVT (refused thrombectomy) discharged back to SNF on Eliquis -presented to the ED on 11/13 with generalized pain-CTA chest showed submassive pulmonary embolism..  Significant events: 11/13>> admit to [facility]  Significant studies: 11/13>> CT abdomen/pelvis: B/L pulmonary emboli-no evidence of right heart strain.  Stable low-density within the left external iliac vein suspicious for thrombus. 11/13>> CTA chest: B/L pulmonary emboli with right heart strain.  Mild cardiomegaly. 11/14>> B/L LE Doppler:+ve DVT in multiple veins in LLE. 11/14>> echo: EF 50-55%, regional wall motion abnormalities, RV systolic function is low normal.  Significant microbiology data: None  Procedures: None  Consults: None  Subjective: Lying comfortably in bed-no major issues overnight.  Objective: Vitals: Blood pressure 124/78, pulse 92, temperature 97.6 F (36.4 C), temperature source Oral, resp. rate 18, weight 86.9 kg, SpO2 98%.   Exam: Gen Exam:Alert awake-not in any distress HEENT:atraumatic, normocephalic Chest: B/L clear to auscultation anteriorly CVS:S1S2 regular Abdomen:soft non tender, non distended Extremities:no edema Neurology: Non focal-but generalized weakness Skin: no rash  Pertinent Labs/Radiology:    Latest Ref Rng & Units 08/02/2024    2:42 AM 08/01/2024    3:19 AM 07/31/2024    2:35 AM  CBC  WBC 4.0 - 10.5 K/uL 7.9  7.3  7.5   Hemoglobin 12.0 - 15.0 g/dL 89.0  88.5  88.9   Hematocrit 36.0 - 46.0 % 33.8  36.2  34.0   Platelets 150 - 400 K/uL 152  140   147     Lab Results  Component Value Date   NA 140 08/03/2024   K 3.7 08/03/2024   CL 105 08/03/2024   CO2 23 08/03/2024      Assessment/Plan: Submassive PE LLE DVT Provoked VTE in the setting of recent left ankle fracture and nonambulatory status Hemodynamically stable Initially on IV heparin -has been transitioned back to Eliquis .  Delirium No major issues overnight-awake/alert this morning Continue Seroquel 25 mg (dosage decreased to 25 mg due to excessive somnolence) Delirium precautions  Recent left ankle fracture-s/p ORIF 7/9 WBAT Mobilize is much as possible with PT/OT  HTN BP stable Imdur /olmesartan  on hold-resume when able.  HLD Fenofibrate   Gout Allopurinol   GERD PPI  Vitamin B12/folic acid  deficiency Repeat levels this morning are stable-continue supplementation.  Mood disorder Cymbalta   Class 1 Obesity: Estimated body mass index is 30.92 kg/m as calculated from the following:   Height as of an earlier encounter on 07/27/24: 5' 6 (1.676 m).   Weight as of this encounter: 86.9 kg.   Code status:   Code Status: Full Code   DVT Prophylaxis: apixaban  (ELIQUIS ) tablet 5 mg     Family Communication: Spouse-Jerry [phone] called 11/19-left VM   Disposition Plan: Status is: Inpatient Remains inpatient appropriate because: Severity of illness   Planned Discharge Destination:Skilled nursing facility   Diet: Diet Order             Diet Heart Room service appropriate? Yes; Fluid consistency: Thin  Diet effective  now                     Antimicrobial agents: Anti-infectives (From admission, onward)    None        MEDICATIONS: Scheduled Meds:  allopurinol   300 mg Oral BID   apixaban   5 mg Oral BID   calcium -vitamin D   1 tablet Oral BID   cyanocobalamin   1,000 mcg Subcutaneous Daily   Followed by   NOREEN ON 08/04/2024] cyanocobalamin   1,000 mcg Subcutaneous Q30 days   DULoxetine   60 mg Oral Daily   folic acid   1 mg  Oral Daily   insulin  aspart  0-15 Units Subcutaneous Q4H   pantoprazole   40 mg Oral Daily   QUEtiapine  25 mg Oral Q24H   rosuvastatin   40 mg Oral Daily   senna-docusate  1 tablet Oral BID   sodium chloride  flush  3 mL Intravenous Q12H   Continuous Infusions: PRN Meds:.acetaminophen  **OR** acetaminophen , haloperidol lactate, haloperidol lactate, HYDROcodone -acetaminophen , polyethylene glycol   I have personally reviewed following labs and imaging studies  LABORATORY DATA: CBC: Recent Labs  Lab 07/29/24 0227 07/30/24 0609 07/31/24 0235 08/01/24 0319 08/02/24 0242  WBC 5.9 5.7 7.5 7.3 7.9  HGB 10.5* 11.9* 11.0* 11.4* 10.9*  HCT 34.0* 36.5 34.0* 36.2 33.8*  MCV 103.3* 99.7 99.4 100.8* 100.3*  PLT 172 154 147* 140* 152    Basic Metabolic Panel: Recent Labs  Lab 07/29/24 0605 07/30/24 0609 07/31/24 0235 08/01/24 0319 08/02/24 0242 08/03/24 0224  NA 141 141 141 142 140 140  K 3.4* 3.8 3.4* 4.7 3.7 3.7  CL 110 109 108 108 105 105  CO2 23 21* 21* 26 23 23   GLUCOSE 83 80 100* 109* 122* 110*  BUN 9 8 8 9 12 13   CREATININE 0.75 0.76 0.68 1.00 0.86 0.66  CALCIUM  8.6* 9.2 9.7 9.8 9.4 9.4  MG 1.2* 2.0 1.6* 1.9 1.5* 1.7  PHOS 2.6 1.9* 2.5 4.2 3.1  --     GFR: Estimated Creatinine Clearance: 70.5 mL/min (by C-G formula based on SCr of 0.66 mg/dL).  Liver Function Tests: Recent Labs  Lab 07/27/24 1250 07/28/24 0352  AST 21 14*  ALT 12 9  ALKPHOS 63 48  BILITOT 1.0 0.8  PROT 5.9* 4.3*  ALBUMIN  3.1* 2.2*   Recent Labs  Lab 07/27/24 1250  LIPASE 29   No results for input(s): AMMONIA in the last 168 hours.  Coagulation Profile: No results for input(s): INR, PROTIME in the last 168 hours.  Cardiac Enzymes: No results for input(s): CKTOTAL, CKMB, CKMBINDEX, TROPONINI in the last 168 hours.  BNP (last 3 results) No results for input(s): PROBNP in the last 8760 hours.  Lipid Profile: No results for input(s): CHOL, HDL, LDLCALC, TRIG,  CHOLHDL, LDLDIRECT in the last 72 hours.  Thyroid  Function Tests: No results for input(s): TSH, T4TOTAL, FREET4, T3FREE, THYROIDAB in the last 72 hours.  Anemia Panel: Recent Labs    08/03/24 0224  VITAMINB12 990*  FOLATE 15.7    Urine analysis:    Component Value Date/Time   COLORURINE YELLOW 07/27/2024 1726   APPEARANCEUR CLEAR 07/27/2024 1726   LABSPEC 1.044 (H) 07/27/2024 1726   PHURINE 5.0 07/27/2024 1726   GLUCOSEU NEGATIVE 07/27/2024 1726   HGBUR NEGATIVE 07/27/2024 1726   BILIRUBINUR NEGATIVE 07/27/2024 1726   BILIRUBINUR neg 12/09/2015 1701   KETONESUR NEGATIVE 07/27/2024 1726   PROTEINUR NEGATIVE 07/27/2024 1726   UROBILINOGEN 0.2 12/09/2015 1701   UROBILINOGEN 0.2 04/13/2014 1422  NITRITE NEGATIVE 07/27/2024 1726   LEUKOCYTESUR NEGATIVE 07/27/2024 1726    Sepsis Labs: Lactic Acid, Venous    Component Value Date/Time   LATICACIDVEN 1.0 07/27/2024 1628    MICROBIOLOGY: No results found for this or any previous visit (from the past 240 hours).  RADIOLOGY STUDIES/RESULTS: No results found.   LOS: 7 days   Donalda Applebaum, MD  [facility]    To contact the attending provider between 7A-7P or the covering provider during after hours 7P-7A, please log into the web site [email or web address] and access using universal [place] password for that web site. If you do not have the password, please call the hospital operator.  08/03/2024, 9:53 AM

## 2024-08-04 DIAGNOSIS — I10 Essential (primary) hypertension: Secondary | ICD-10-CM | POA: Diagnosis not present

## 2024-08-04 DIAGNOSIS — E538 Deficiency of other specified B group vitamins: Secondary | ICD-10-CM | POA: Diagnosis not present

## 2024-08-04 DIAGNOSIS — I2602 Saddle embolus of pulmonary artery with acute cor pulmonale: Secondary | ICD-10-CM | POA: Diagnosis not present

## 2024-08-04 LAB — GLUCOSE, CAPILLARY
Glucose-Capillary: 103 mg/dL — ABNORMAL HIGH (ref 70–99)
Glucose-Capillary: 103 mg/dL — ABNORMAL HIGH (ref 70–99)
Glucose-Capillary: 105 mg/dL — ABNORMAL HIGH (ref 70–99)
Glucose-Capillary: 113 mg/dL — ABNORMAL HIGH (ref 70–99)
Glucose-Capillary: 134 mg/dL — ABNORMAL HIGH (ref 70–99)
Glucose-Capillary: 147 mg/dL — ABNORMAL HIGH (ref 70–99)

## 2024-08-04 MED ORDER — POLYETHYLENE GLYCOL 3350 17 G PO PACK
17.0000 g | PACK | Freq: Two times a day (BID) | ORAL | Status: AC
  Administered 2024-08-04: 17 g via ORAL
  Filled 2024-08-04 (×2): qty 1

## 2024-08-04 MED ORDER — POLYETHYLENE GLYCOL 3350 17 G PO PACK
17.0000 g | PACK | Freq: Every day | ORAL | Status: DC
  Filled 2024-08-04 (×4): qty 1

## 2024-08-04 MED ORDER — BISACODYL 10 MG RE SUPP: 10.0000 mg | Freq: Every day | RECTAL | Status: DC | PRN

## 2024-08-04 NOTE — TOC Progression Note (Signed)
 Transition of Care [facility]) - Progression Note    Patient Details  Name: Rachel Black MRN: [id number] Date of Birth: October 11, 1950  Transition of Care [facility]) CM/SW Contact  Luann SHAUNNA [place], [place] Phone Number: 08/04/2024, 12:51 PM  Clinical Narrative:     [facility] can offer private pay bed for pt.(Monthly rate 10,440). CSW left voicemail with pt spouse.  Bed would be available Monday. [facility] to discuss financials with pt's spouse.   Expected Discharge Plan: Skilled Nursing Facility Barriers to Discharge: Continued Medical Work up, Inadequate or no insurance, SNF Pending bed offer               Expected Discharge Plan and Services In-house Referral: Clinical Social Work     Living arrangements for the past 2 months: Single Family Home                                       Social Drivers of Health (SDOH) Interventions SDOH Screenings   Food Insecurity: No Food Insecurity (07/27/2024)  Housing: Low Risk  (07/27/2024)  Transportation Needs: No Transportation Needs (07/27/2024)  Utilities: Not At Risk (07/27/2024)  Depression (PHQ2-9): High Risk (07/27/2024)  Social Connections: Moderately Integrated (07/27/2024)  Tobacco Use: Low Risk  (07/27/2024)    Readmission Risk Interventions    03/28/2024   10:35 AM  Readmission Risk Prevention Plan  Transportation Screening Complete  PCP or Specialist Appt within 5-7 Days Complete  Home Care Screening Complete  Medication Review (RN CM) Complete

## 2024-08-04 NOTE — Progress Notes (Signed)
 PT Cancellation Note  Patient Details Name: Rachel Black MRN: [id number] DOB: [birth year]   Cancelled Treatment:    Reason Eval/Treat Not Completed: Patient declined, no reason specified (Pt declined stating that she doesnt feel like it. Will follow up later if time allows.)   Stephanos Fan 08/04/2024, 3:23 PM

## 2024-08-04 NOTE — Progress Notes (Signed)
 PROGRESS NOTE        PATIENT DETAILS Name: Rachel Black Age: 73 y.o. Sex: female Date of Birth: 1951/02/12 Admit Date: 07/27/2024 Admitting Physician Mario Tobie GAILS, MD ERE:Nodnw, Toribio [name], MD  Brief Summary: Patient is a 73 y.o.  female who sustained a left trimalleolar ankle fracture-s/p ORIF on 7/9-subsequently discharged to SNF-when she was nonambulatory-recently seen in the ED on 11/5 for LLE DVT (refused thrombectomy) discharged back to SNF on Eliquis -presented to the ED on 11/13 with generalized pain-CTA chest showed submassive pulmonary embolism..  Significant events: 11/13>> admit to [facility]  Significant studies: 11/13>> CT abdomen/pelvis: B/L pulmonary emboli-no evidence of right heart strain.  Stable low-density within the left external iliac vein suspicious for thrombus. 11/13>> CTA chest: B/L pulmonary emboli with right heart strain.  Mild cardiomegaly. 11/14>> B/L LE Doppler:+ve DVT in multiple veins in LLE. 11/14>> echo: EF 50-55%, regional wall motion abnormalities, RV systolic function is low normal.  Significant microbiology data: None  Procedures: None  Consults: None  Subjective: Some abdominal pain overnight but no major issues.  Objective: Vitals: Blood pressure 127/67, pulse 86, temperature (!) 97.5 F (36.4 C), temperature source Oral, resp. rate 18, weight 87 kg, SpO2 98%.   Exam: Gen Exam:Alert awake-not in any distress HEENT:atraumatic, normocephalic Chest: B/L clear to auscultation anteriorly CVS:S1S2 regular Abdomen:soft non tender, non distended Extremities:no edema Neurology: Non focal Skin: no rash  Pertinent Labs/Radiology:    Latest Ref Rng & Units 08/02/2024    2:42 AM 08/01/2024    3:19 AM 07/31/2024    2:35 AM  CBC  WBC 4.0 - 10.5 K/uL 7.9  7.3  7.5   Hemoglobin 12.0 - 15.0 g/dL 89.0  88.5  88.9   Hematocrit 36.0 - 46.0 % 33.8  36.2  34.0   Platelets 150 - 400 K/uL 152  140  147     Lab Results   Component Value Date   NA 140 08/03/2024   K 3.7 08/03/2024   CL 105 08/03/2024   CO2 23 08/03/2024      Assessment/Plan: Submassive PE LLE DVT Provoked VTE in the setting of recent left ankle fracture and nonambulatory status Hemodynamically stable Initially on IV heparin -has been transitioned back to Eliquis .  Delirium No major issues overnight-awake/alert this morning Continue Seroquel  25 mg (dosage decreased to 25 mg due to excessive somnolence) Delirium precautions  Recent left ankle fracture-s/p ORIF 7/9 WBAT Mobilize is much as possible with PT/OT  HTN BP stable Imdur /olmesartan  on hold-resume when able.  HLD Fenofibrate   Gout Allopurinol   GERD PPI  Vitamin B12/folic acid  deficiency Repeat levels this morning are stable-continue supplementation.  Mood disorder Cymbalta   Constipation MiraLAX /senna As needed Dulcolax suppository Follow  Class 1 Obesity: Estimated body mass index is 30.96 kg/m as calculated from the following:   Height as of an earlier encounter on 07/27/24: 5' 6 (1.676 m).   Weight as of this encounter: 87 kg.   Code status:   Code Status: Full Code   DVT Prophylaxis: apixaban  (ELIQUIS ) tablet 5 mg     Family Communication: Spouse-Jerry [phone] called 11/19-left VM   Disposition Plan: Status is: Inpatient Remains inpatient appropriate because: Severity of illness   Planned Discharge Destination:Skilled nursing facility   Diet: Diet Order             Diet Heart Room service  appropriate? Yes; Fluid consistency: Thin  Diet effective now                     Antimicrobial agents: Anti-infectives (From admission, onward)    None        MEDICATIONS: Scheduled Meds:  allopurinol   300 mg Oral BID   apixaban   5 mg Oral BID   calcium -vitamin D   1 tablet Oral BID   cyanocobalamin   1,000 mcg Subcutaneous Daily   Followed by   cyanocobalamin   1,000 mcg Subcutaneous Q30 days   DULoxetine   60 mg Oral  Daily   folic acid   1 mg Oral Daily   insulin  aspart  0-15 Units Subcutaneous Q4H   pantoprazole   40 mg Oral Daily   QUEtiapine   25 mg Oral Q24H   rosuvastatin   40 mg Oral Daily   senna-docusate  1 tablet Oral BID   sodium chloride  flush  3 mL Intravenous Q12H   Continuous Infusions: PRN Meds:.acetaminophen  **OR** acetaminophen , haloperidol  lactate, haloperidol  lactate, HYDROcodone -acetaminophen , polyethylene glycol   I have personally reviewed following labs and imaging studies  LABORATORY DATA: CBC: Recent Labs  Lab 07/29/24 0227 07/30/24 0609 07/31/24 0235 08/01/24 0319 08/02/24 0242  WBC 5.9 5.7 7.5 7.3 7.9  HGB 10.5* 11.9* 11.0* 11.4* 10.9*  HCT 34.0* 36.5 34.0* 36.2 33.8*  MCV 103.3* 99.7 99.4 100.8* 100.3*  PLT 172 154 147* 140* 152    Basic Metabolic Panel: Recent Labs  Lab 07/29/24 0605 07/30/24 0609 07/31/24 0235 08/01/24 0319 08/02/24 0242 08/03/24 0224  NA 141 141 141 142 140 140  K 3.4* 3.8 3.4* 4.7 3.7 3.7  CL 110 109 108 108 105 105  CO2 23 21* 21* 26 23 23   GLUCOSE 83 80 100* 109* 122* 110*  BUN 9 8 8 9 12 13   CREATININE 0.75 0.76 0.68 1.00 0.86 0.66  CALCIUM  8.6* 9.2 9.7 9.8 9.4 9.4  MG 1.2* 2.0 1.6* 1.9 1.5* 1.7  PHOS 2.6 1.9* 2.5 4.2 3.1  --     GFR: Estimated Creatinine Clearance: 70.6 mL/min (by C-G formula based on SCr of 0.66 mg/dL).  Liver Function Tests: No results for input(s): AST, ALT, ALKPHOS, BILITOT, PROT, ALBUMIN  in the last 168 hours.  No results for input(s): LIPASE, AMYLASE in the last 168 hours.  No results for input(s): AMMONIA in the last 168 hours.  Coagulation Profile: No results for input(s): INR, PROTIME in the last 168 hours.  Cardiac Enzymes: No results for input(s): CKTOTAL, CKMB, CKMBINDEX, TROPONINI in the last 168 hours.  BNP (last 3 results) No results for input(s): PROBNP in the last 8760 hours.  Lipid Profile: No results for input(s): CHOL, HDL, LDLCALC,  TRIG, CHOLHDL, LDLDIRECT in the last 72 hours.  Thyroid  Function Tests: No results for input(s): TSH, T4TOTAL, FREET4, T3FREE, THYROIDAB in the last 72 hours.  Anemia Panel: Recent Labs    08/03/24 0224  VITAMINB12 990*  FOLATE 15.7    Urine analysis:    Component Value Date/Time   COLORURINE YELLOW 07/27/2024 1726   APPEARANCEUR CLEAR 07/27/2024 1726   LABSPEC 1.044 (H) 07/27/2024 1726   PHURINE 5.0 07/27/2024 1726   GLUCOSEU NEGATIVE 07/27/2024 1726   HGBUR NEGATIVE 07/27/2024 1726   BILIRUBINUR NEGATIVE 07/27/2024 1726   BILIRUBINUR neg 12/09/2015 1701   KETONESUR NEGATIVE 07/27/2024 1726   PROTEINUR NEGATIVE 07/27/2024 1726   UROBILINOGEN 0.2 12/09/2015 1701   UROBILINOGEN 0.2 04/13/2014 1422   NITRITE NEGATIVE 07/27/2024 1726   LEUKOCYTESUR NEGATIVE 07/27/2024 1726  Sepsis Labs: Lactic Acid, Venous    Component Value Date/Time   LATICACIDVEN 1.0 07/27/2024 1628    MICROBIOLOGY: No results found for this or any previous visit (from the past 240 hours).  RADIOLOGY STUDIES/RESULTS: No results found.   LOS: 8 days   Donalda Applebaum, MD  [facility]    To contact the attending provider between 7A-7P or the covering provider during after hours 7P-7A, please log into the web site [email or web address] and access using universal [place] password for that web site. If you do not have the password, please call the hospital operator.  08/04/2024, 9:51 AM

## 2024-08-04 NOTE — Plan of Care (Signed)
  Problem: Education: Goal: Ability to describe self-care measures that may prevent or decrease complications (Diabetes Survival Skills Education) will improve Outcome: Progressing Goal: Individualized Educational Video(s) Outcome: Progressing   Problem: Coping: Goal: Ability to adjust to condition or change in health will improve Outcome: Progressing   Problem: Fluid Volume: Goal: Ability to maintain a balanced intake and output will improve Outcome: Progressing   Problem: Health Behavior/Discharge Planning: Goal: Ability to identify and utilize available resources and services will improve Outcome: Progressing Goal: Ability to manage health-related needs will improve Outcome: Progressing   Problem: Metabolic: Goal: Ability to maintain appropriate glucose levels will improve Outcome: Progressing   Problem: Nutritional: Goal: Maintenance of adequate nutrition will improve Outcome: Progressing Goal: Progress toward achieving an optimal weight will improve Outcome: Progressing   Problem: Skin Integrity: Goal: Risk for impaired skin integrity will decrease Outcome: Progressing   Problem: Tissue Perfusion: Goal: Adequacy of tissue perfusion will improve Outcome: Progressing   Problem: Education: Goal: Knowledge of General Education information will improve Description: Including pain rating scale, medication(s)/side effects and non-pharmacologic comfort measures Outcome: Progressing   Problem: Health Behavior/Discharge Planning: Goal: Ability to manage health-related needs will improve Outcome: Progressing   Problem: Clinical Measurements: Goal: Ability to maintain clinical measurements within normal limits will improve Outcome: Progressing Goal: Will remain free from infection Outcome: Progressing Goal: Diagnostic test results will improve Outcome: Progressing Goal: Respiratory complications will improve Outcome: Progressing Goal: Cardiovascular complication will  be avoided Outcome: Progressing   Problem: Activity: Goal: Risk for activity intolerance will decrease Outcome: Progressing   Problem: Nutrition: Goal: Adequate nutrition will be maintained Outcome: Progressing   Problem: Coping: Goal: Level of anxiety will decrease Outcome: Progressing   Problem: Elimination: Goal: Will not experience complications related to bowel motility Outcome: Progressing Goal: Will not experience complications related to urinary retention Outcome: Progressing   Problem: Pain Managment: Goal: General experience of comfort will improve and/or be controlled Outcome: Progressing   Problem: Safety: Goal: Ability to remain free from injury will improve Outcome: Progressing   Problem: Skin Integrity: Goal: Risk for impaired skin integrity will decrease Outcome: Progressing   Problem: Safety: Goal: Non-violent Restraint(s) Outcome: Progressing   Problem: Safety: Goal: Non-violent Restraint(s) Outcome: Progressing

## 2024-08-04 NOTE — Plan of Care (Signed)
  Problem: Health Behavior/Discharge Planning: Goal: Ability to identify and utilize available resources and services will improve Outcome: Adequate for Discharge Goal: Ability to manage health-related needs will improve Outcome: Adequate for Discharge   Problem: Metabolic: Goal: Ability to maintain appropriate glucose levels will improve Outcome: Adequate for Discharge   Problem: Health Behavior/Discharge Planning: Goal: Ability to manage health-related needs will improve Outcome: Adequate for Discharge

## 2024-08-05 DIAGNOSIS — I10 Essential (primary) hypertension: Secondary | ICD-10-CM | POA: Diagnosis not present

## 2024-08-05 DIAGNOSIS — I2602 Saddle embolus of pulmonary artery with acute cor pulmonale: Secondary | ICD-10-CM | POA: Diagnosis not present

## 2024-08-05 DIAGNOSIS — E538 Deficiency of other specified B group vitamins: Secondary | ICD-10-CM | POA: Diagnosis not present

## 2024-08-05 LAB — GLUCOSE, CAPILLARY
Glucose-Capillary: 104 mg/dL — ABNORMAL HIGH (ref 70–99)
Glucose-Capillary: 117 mg/dL — ABNORMAL HIGH (ref 70–99)
Glucose-Capillary: 124 mg/dL — ABNORMAL HIGH (ref 70–99)
Glucose-Capillary: 127 mg/dL — ABNORMAL HIGH (ref 70–99)
Glucose-Capillary: 129 mg/dL — ABNORMAL HIGH (ref 70–99)
Glucose-Capillary: 138 mg/dL — ABNORMAL HIGH (ref 70–99)

## 2024-08-05 NOTE — Progress Notes (Signed)
 PROGRESS NOTE        PATIENT DETAILS Name: Rachel Black Age: 73 y.o. Sex: female Date of Birth: [birth year] Admit Date: 07/27/2024 Admitting Physician Mario Tobie GAILS, MD ERE:Nodnw, Toribio [name], MD  Brief Summary: Patient is a 73 y.o.  female who sustained a left trimalleolar ankle fracture-s/p ORIF on 7/9-subsequently discharged to SNF-when she was nonambulatory-recently seen in the ED on 11/5 for LLE DVT (refused thrombectomy) discharged back to SNF on Eliquis -presented to the ED on 11/13 with generalized pain-CTA chest showed submassive pulmonary embolism..  Significant events: 11/13>> admit to [facility]  Significant studies: 11/13>> CT abdomen/pelvis: B/L pulmonary emboli-no evidence of right heart strain.  Stable low-density within the left external iliac vein suspicious for thrombus. 11/13>> CTA chest: B/L pulmonary emboli with right heart strain.  Mild cardiomegaly. 11/14>> B/L LE Doppler:+ve DVT in multiple veins in LLE. 11/14>> echo: EF 50-55%, regional wall motion abnormalities, RV systolic function is low normal.  Significant microbiology data: None  Procedures: None  Consults: None  Subjective: No major issues overnight-lying comfortably in bed.  Awaiting SNF.  Objective: Vitals: Blood pressure 114/74, pulse 76, temperature 97.8 F (36.6 C), temperature source Oral, resp. rate 18, weight 84.4 kg, SpO2 97%.   Exam: Awake/alert Not in any distress Eyes weakness but moving all 4 extremities.  Pertinent Labs/Radiology:    Latest Ref Rng & Units 08/02/2024    2:42 AM 08/01/2024    3:19 AM 07/31/2024    2:35 AM  CBC  WBC 4.0 - 10.5 K/uL 7.9  7.3  7.5   Hemoglobin 12.0 - 15.0 g/dL 89.0  88.5  88.9   Hematocrit 36.0 - 46.0 % 33.8  36.2  34.0   Platelets 150 - 400 K/uL 152  140  147     Lab Results  Component Value Date   NA 140 08/03/2024   K 3.7 08/03/2024   CL 105 08/03/2024   CO2 23 08/03/2024      Assessment/Plan: Submassive  PE LLE DVT Provoked VTE in the setting of recent left ankle fracture and nonambulatory status Hemodynamically stable Initially on IV heparin -has been transitioned back to Eliquis .  Delirium No major issues overnight-awake/alert this morning Continue Seroquel  25 mg (dosage decreased to 25 mg due to excessive somnolence) Delirium precautions  Recent left ankle fracture-s/p ORIF 7/9 WBAT Mobilize is much as possible with PT/OT  HTN BP stable Imdur /olmesartan  on hold-resume when able.  HLD Fenofibrate   Gout Allopurinol   GERD PPI  Vitamin B12/folic acid  deficiency Repeat levels this morning are stable-continue supplementation.  Mood disorder Cymbalta   Constipation MiraLAX /senna As needed Dulcolax suppository Follow  Class 1 Obesity: Estimated body mass index is 30.03 kg/m as calculated from the following:   Height as of an earlier encounter on 07/27/24: 5' 6 (1.676 m).   Weight as of this encounter: 84.4 kg.   Code status:   Code Status: Full Code   DVT Prophylaxis: apixaban  (ELIQUIS ) tablet 5 mg     Family Communication: Spouse-Jerry [phone] called 11/19-left VM   Disposition Plan: Status is: Inpatient Remains inpatient appropriate because: Severity of illness   Planned Discharge Destination:Skilled nursing facility   Diet: Diet Order             Diet Heart Room service appropriate? Yes; Fluid consistency: Thin  Diet effective now  Antimicrobial agents: Anti-infectives (From admission, onward)    None        MEDICATIONS: Scheduled Meds:  allopurinol   300 mg Oral BID   apixaban   5 mg Oral BID   calcium -vitamin D   1 tablet Oral BID   cyanocobalamin   1,000 mcg Subcutaneous Q30 days   DULoxetine   60 mg Oral Daily   folic acid   1 mg Oral Daily   insulin  aspart  0-15 Units Subcutaneous Q4H   pantoprazole   40 mg Oral Daily   polyethylene glycol  17 g Oral Daily   QUEtiapine   25 mg Oral Q24H   rosuvastatin    40 mg Oral Daily   senna-docusate  1 tablet Oral BID   sodium chloride  flush  3 mL Intravenous Q12H   Continuous Infusions: PRN Meds:.acetaminophen  **OR** acetaminophen , bisacodyl , haloperidol  lactate, haloperidol  lactate, HYDROcodone -acetaminophen    I have personally reviewed following labs and imaging studies  LABORATORY DATA: CBC: Recent Labs  Lab 07/30/24 0609 07/31/24 0235 08/01/24 0319 08/02/24 0242  WBC 5.7 7.5 7.3 7.9  HGB 11.9* 11.0* 11.4* 10.9*  HCT 36.5 34.0* 36.2 33.8*  MCV 99.7 99.4 100.8* 100.3*  PLT 154 147* 140* 152    Basic Metabolic Panel: Recent Labs  Lab 07/30/24 0609 07/31/24 0235 08/01/24 0319 08/02/24 0242 08/03/24 0224  NA 141 141 142 140 140  K 3.8 3.4* 4.7 3.7 3.7  CL 109 108 108 105 105  CO2 21* 21* 26 23 23   GLUCOSE 80 100* 109* 122* 110*  BUN 8 8 9 12 13   CREATININE 0.76 0.68 1.00 0.86 0.66  CALCIUM  9.2 9.7 9.8 9.4 9.4  MG 2.0 1.6* 1.9 1.5* 1.7  PHOS 1.9* 2.5 4.2 3.1  --     GFR: Estimated Creatinine Clearance: 69.5 mL/min (by C-G formula based on SCr of 0.66 mg/dL).  Liver Function Tests: No results for input(s): AST, ALT, ALKPHOS, BILITOT, PROT, ALBUMIN  in the last 168 hours.  No results for input(s): LIPASE, AMYLASE in the last 168 hours.  No results for input(s): AMMONIA in the last 168 hours.  Coagulation Profile: No results for input(s): INR, PROTIME in the last 168 hours.  Cardiac Enzymes: No results for input(s): CKTOTAL, CKMB, CKMBINDEX, TROPONINI in the last 168 hours.  BNP (last 3 results) No results for input(s): PROBNP in the last 8760 hours.  Lipid Profile: No results for input(s): CHOL, HDL, LDLCALC, TRIG, CHOLHDL, LDLDIRECT in the last 72 hours.  Thyroid  Function Tests: No results for input(s): TSH, T4TOTAL, FREET4, T3FREE, THYROIDAB in the last 72 hours.  Anemia Panel: Recent Labs    08/03/24 0224  VITAMINB12 990*  FOLATE 15.7    Urine  analysis:    Component Value Date/Time   COLORURINE YELLOW 07/27/2024 1726   APPEARANCEUR CLEAR 07/27/2024 1726   LABSPEC 1.044 (H) 07/27/2024 1726   PHURINE 5.0 07/27/2024 1726   GLUCOSEU NEGATIVE 07/27/2024 1726   HGBUR NEGATIVE 07/27/2024 1726   BILIRUBINUR NEGATIVE 07/27/2024 1726   BILIRUBINUR neg 12/09/2015 1701   KETONESUR NEGATIVE 07/27/2024 1726   PROTEINUR NEGATIVE 07/27/2024 1726   UROBILINOGEN 0.2 12/09/2015 1701   UROBILINOGEN 0.2 04/13/2014 1422   NITRITE NEGATIVE 07/27/2024 1726   LEUKOCYTESUR NEGATIVE 07/27/2024 1726    Sepsis Labs: Lactic Acid, Venous    Component Value Date/Time   LATICACIDVEN 1.0 07/27/2024 1628    MICROBIOLOGY: No results found for this or any previous visit (from the past 240 hours).  RADIOLOGY STUDIES/RESULTS: No results found.   LOS: 9 days   Lile Mccurley  Muhammadali Ries, MD  [facility]    To contact the attending provider between 7A-7P or the covering provider during after hours 7P-7A, please log into the web site [email or web address] and access using universal [place] password for that web site. If you do not have the password, please call the hospital operator.  08/05/2024, 10:23 AM

## 2024-08-05 NOTE — Plan of Care (Signed)
  Problem: Health Behavior/Discharge Planning: Goal: Ability to identify and utilize available resources and services will improve Outcome: Progressing Goal: Ability to manage health-related needs will improve Outcome: Progressing   Problem: Education: Goal: Knowledge of General Education information will improve Description: Including pain rating scale, medication(s)/side effects and non-pharmacologic comfort measures Outcome: Progressing   Problem: Clinical Measurements: Goal: Diagnostic test results will improve Outcome: Progressing

## 2024-08-05 NOTE — Plan of Care (Signed)
  Problem: Education: Goal: Ability to describe self-care measures that may prevent or decrease complications (Diabetes Survival Skills Education) will improve Outcome: Progressing Goal: Individualized Educational Video(s) Outcome: Progressing   Problem: Coping: Goal: Ability to adjust to condition or change in health will improve Outcome: Progressing   Problem: Fluid Volume: Goal: Ability to maintain a balanced intake and output will improve Outcome: Progressing   Problem: Health Behavior/Discharge Planning: Goal: Ability to identify and utilize available resources and services will improve Outcome: Progressing Goal: Ability to manage health-related needs will improve Outcome: Progressing   Problem: Metabolic: Goal: Ability to maintain appropriate glucose levels will improve Outcome: Progressing   Problem: Nutritional: Goal: Maintenance of adequate nutrition will improve Outcome: Progressing Goal: Progress toward achieving an optimal weight will improve Outcome: Progressing   Problem: Skin Integrity: Goal: Risk for impaired skin integrity will decrease Outcome: Progressing   Problem: Tissue Perfusion: Goal: Adequacy of tissue perfusion will improve Outcome: Progressing   Problem: Education: Goal: Knowledge of General Education information will improve Description: Including pain rating scale, medication(s)/side effects and non-pharmacologic comfort measures Outcome: Progressing   Problem: Health Behavior/Discharge Planning: Goal: Ability to manage health-related needs will improve Outcome: Progressing   Problem: Clinical Measurements: Goal: Ability to maintain clinical measurements within normal limits will improve Outcome: Progressing Goal: Will remain free from infection Outcome: Progressing Goal: Diagnostic test results will improve Outcome: Progressing Goal: Respiratory complications will improve Outcome: Progressing Goal: Cardiovascular complication will  be avoided Outcome: Progressing   Problem: Activity: Goal: Risk for activity intolerance will decrease Outcome: Progressing   Problem: Nutrition: Goal: Adequate nutrition will be maintained Outcome: Progressing   Problem: Coping: Goal: Level of anxiety will decrease Outcome: Progressing   Problem: Elimination: Goal: Will not experience complications related to bowel motility Outcome: Progressing Goal: Will not experience complications related to urinary retention Outcome: Progressing   Problem: Pain Managment: Goal: General experience of comfort will improve and/or be controlled Outcome: Progressing   Problem: Safety: Goal: Ability to remain free from injury will improve Outcome: Progressing   Problem: Skin Integrity: Goal: Risk for impaired skin integrity will decrease Outcome: Progressing   Problem: Safety: Goal: Non-violent Restraint(s) Outcome: Progressing   Problem: Safety: Goal: Non-violent Restraint(s) Outcome: Progressing

## 2024-08-06 DIAGNOSIS — E538 Deficiency of other specified B group vitamins: Secondary | ICD-10-CM | POA: Diagnosis not present

## 2024-08-06 DIAGNOSIS — I10 Essential (primary) hypertension: Secondary | ICD-10-CM | POA: Diagnosis not present

## 2024-08-06 DIAGNOSIS — I2602 Saddle embolus of pulmonary artery with acute cor pulmonale: Secondary | ICD-10-CM | POA: Diagnosis not present

## 2024-08-06 LAB — GLUCOSE, CAPILLARY
Glucose-Capillary: 101 mg/dL — ABNORMAL HIGH (ref 70–99)
Glucose-Capillary: 113 mg/dL — ABNORMAL HIGH (ref 70–99)
Glucose-Capillary: 119 mg/dL — ABNORMAL HIGH (ref 70–99)
Glucose-Capillary: 126 mg/dL — ABNORMAL HIGH (ref 70–99)
Glucose-Capillary: 129 mg/dL — ABNORMAL HIGH (ref 70–99)
Glucose-Capillary: 133 mg/dL — ABNORMAL HIGH (ref 70–99)

## 2024-08-06 NOTE — Plan of Care (Signed)
  Problem: Education: Goal: Ability to describe self-care measures that may prevent or decrease complications (Diabetes Survival Skills Education) will improve Outcome: Progressing Goal: Individualized Educational Video(s) Outcome: Progressing   Problem: Coping: Goal: Ability to adjust to condition or change in health will improve Outcome: Progressing   Problem: Fluid Volume: Goal: Ability to maintain a balanced intake and output will improve Outcome: Progressing   Problem: Health Behavior/Discharge Planning: Goal: Ability to identify and utilize available resources and services will improve Outcome: Progressing Goal: Ability to manage health-related needs will improve Outcome: Progressing   Problem: Metabolic: Goal: Ability to maintain appropriate glucose levels will improve Outcome: Progressing   Problem: Nutritional: Goal: Maintenance of adequate nutrition will improve Outcome: Progressing Goal: Progress toward achieving an optimal weight will improve Outcome: Progressing   Problem: Skin Integrity: Goal: Risk for impaired skin integrity will decrease Outcome: Progressing   Problem: Tissue Perfusion: Goal: Adequacy of tissue perfusion will improve Outcome: Progressing   Problem: Education: Goal: Knowledge of General Education information will improve Description: Including pain rating scale, medication(s)/side effects and non-pharmacologic comfort measures Outcome: Progressing   Problem: Health Behavior/Discharge Planning: Goal: Ability to manage health-related needs will improve Outcome: Progressing   Problem: Clinical Measurements: Goal: Ability to maintain clinical measurements within normal limits will improve Outcome: Progressing Goal: Will remain free from infection Outcome: Progressing Goal: Diagnostic test results will improve Outcome: Progressing Goal: Respiratory complications will improve Outcome: Progressing Goal: Cardiovascular complication will  be avoided Outcome: Progressing   Problem: Activity: Goal: Risk for activity intolerance will decrease Outcome: Progressing   Problem: Nutrition: Goal: Adequate nutrition will be maintained Outcome: Progressing   Problem: Coping: Goal: Level of anxiety will decrease Outcome: Progressing   Problem: Elimination: Goal: Will not experience complications related to bowel motility Outcome: Progressing Goal: Will not experience complications related to urinary retention Outcome: Progressing   Problem: Pain Managment: Goal: General experience of comfort will improve and/or be controlled Outcome: Progressing   Problem: Safety: Goal: Ability to remain free from injury will improve Outcome: Progressing   Problem: Skin Integrity: Goal: Risk for impaired skin integrity will decrease Outcome: Progressing   Problem: Safety: Goal: Non-violent Restraint(s) Outcome: Progressing   Problem: Safety: Goal: Non-violent Restraint(s) Outcome: Progressing

## 2024-08-06 NOTE — Plan of Care (Signed)
  Problem: Nutritional: Goal: Maintenance of adequate nutrition will improve Outcome: Progressing Goal: Progress toward achieving an optimal weight will improve Outcome: Progressing   Problem: Skin Integrity: Goal: Risk for impaired skin integrity will decrease Outcome: Progressing   Problem: Education: Goal: Knowledge of General Education information will improve Description: Including pain rating scale, medication(s)/side effects and non-pharmacologic comfort measures Outcome: Progressing   Problem: Health Behavior/Discharge Planning: Goal: Ability to manage health-related needs will improve Outcome: Progressing

## 2024-08-06 NOTE — Progress Notes (Signed)
 PROGRESS NOTE        PATIENT DETAILS Name: Rachel Black Age: 73 y.o. Sex: female Date of Birth: Jan 28, 1951 Admit Date: 07/27/2024 Admitting Physician Mario Tobie GAILS, MD ERE:Nodnw, Toribio [name], MD  Brief Summary: Patient is a 73 y.o.  female who sustained a left trimalleolar ankle fracture-s/p ORIF on 7/9-subsequently discharged to SNF-when she was nonambulatory-recently seen in the ED on 11/5 for LLE DVT (refused thrombectomy) discharged back to SNF on Eliquis -presented to the ED on 11/13 with generalized pain-CTA chest showed submassive pulmonary embolism..  Significant events: 11/13>> admit to [facility]  Significant studies: 11/13>> CT abdomen/pelvis: B/L pulmonary emboli-no evidence of right heart strain.  Stable low-density within the left external iliac vein suspicious for thrombus. 11/13>> CTA chest: B/L pulmonary emboli with right heart strain.  Mild cardiomegaly. 11/14>> B/L LE Doppler:+ve DVT in multiple veins in LLE. 11/14>> echo: EF 50-55%, regional wall motion abnormalities, RV systolic function is low normal.  Significant microbiology data: None  Procedures: None  Consults: None  Subjective: Lying comfortably in bed-no major issues overnight.  Appears unchanged.  Denies any chest pain or shortness of breath.  Objective: Vitals: Blood pressure (!) 117/56, pulse 84, temperature 98.2 F (36.8 C), temperature source Oral, resp. rate 18, weight 84.4 kg, SpO2 95%.   Exam: Awake/alert Not in distress Generalized weakness but moving all 4 extremities.  Pertinent Labs/Radiology:    Latest Ref Rng & Units 08/02/2024    2:42 AM 08/01/2024    3:19 AM 07/31/2024    2:35 AM  CBC  WBC 4.0 - 10.5 K/uL 7.9  7.3  7.5   Hemoglobin 12.0 - 15.0 g/dL 89.0  88.5  88.9   Hematocrit 36.0 - 46.0 % 33.8  36.2  34.0   Platelets 150 - 400 K/uL 152  140  147     Lab Results  Component Value Date   NA 140 08/03/2024   K 3.7 08/03/2024   CL 105 08/03/2024    CO2 23 08/03/2024      Assessment/Plan: Submassive PE LLE DVT Provoked VTE in the setting of recent left ankle fracture and nonambulatory status Hemodynamically stable Initially on IV heparin -has been transitioned back to Eliquis .  Delirium No major issues overnight-awake/alert this morning Continue Seroquel  25 mg (dosage decreased to 25 mg due to excessive somnolence) Delirium precautions  Recent left ankle fracture-s/p ORIF 7/9 WBAT Mobilize is much as possible with PT/OT  HTN BP stable Imdur /olmesartan  on hold-resume when able.  HLD Fenofibrate   Gout Allopurinol   GERD PPI  Vitamin B12/folic acid  deficiency Repeat levels this morning are stable-continue supplementation.  Mood disorder Cymbalta   Constipation MiraLAX /senna As needed Dulcolax suppository Follow  Class 1 Obesity: Estimated body mass index is 30.03 kg/m as calculated from the following:   Height as of an earlier encounter on 07/27/24: 5' 6 (1.676 m).   Weight as of this encounter: 84.4 kg.   Code status:   Code Status: Full Code   DVT Prophylaxis: apixaban  (ELIQUIS ) tablet 5 mg     Family Communication: Spouse-Jerry [phone] called 11/19-left VM   Disposition Plan: Status is: Inpatient Remains inpatient appropriate because: Severity of illness   Planned Discharge Destination:Skilled nursing facility   Diet: Diet Order             Diet Heart Room service appropriate? Yes; Fluid consistency: Thin  Diet  effective now                     Antimicrobial agents: Anti-infectives (From admission, onward)    None        MEDICATIONS: Scheduled Meds:  allopurinol   300 mg Oral BID   apixaban   5 mg Oral BID   calcium -vitamin D   1 tablet Oral BID   cyanocobalamin   1,000 mcg Subcutaneous Q30 days   DULoxetine   60 mg Oral Daily   folic acid   1 mg Oral Daily   insulin  aspart  0-15 Units Subcutaneous Q4H   pantoprazole   40 mg Oral Daily   polyethylene glycol  17 g  Oral Daily   QUEtiapine   25 mg Oral Q24H   rosuvastatin   40 mg Oral Daily   senna-docusate  1 tablet Oral BID   sodium chloride  flush  3 mL Intravenous Q12H   Continuous Infusions: PRN Meds:.acetaminophen  **OR** acetaminophen , bisacodyl , haloperidol  lactate, haloperidol  lactate, HYDROcodone -acetaminophen    I have personally reviewed following labs and imaging studies  LABORATORY DATA: CBC: Recent Labs  Lab 07/31/24 0235 08/01/24 0319 08/02/24 0242  WBC 7.5 7.3 7.9  HGB 11.0* 11.4* 10.9*  HCT 34.0* 36.2 33.8*  MCV 99.4 100.8* 100.3*  PLT 147* 140* 152    Basic Metabolic Panel: Recent Labs  Lab 07/31/24 0235 08/01/24 0319 08/02/24 0242 08/03/24 0224  NA 141 142 140 140  K 3.4* 4.7 3.7 3.7  CL 108 108 105 105  CO2 21* 26 23 23   GLUCOSE 100* 109* 122* 110*  BUN 8 9 12 13   CREATININE 0.68 1.00 0.86 0.66  CALCIUM  9.7 9.8 9.4 9.4  MG 1.6* 1.9 1.5* 1.7  PHOS 2.5 4.2 3.1  --     GFR: Estimated Creatinine Clearance: 69.5 mL/min (by C-G formula based on SCr of 0.66 mg/dL).  Liver Function Tests: No results for input(s): AST, ALT, ALKPHOS, BILITOT, PROT, ALBUMIN  in the last 168 hours.  No results for input(s): LIPASE, AMYLASE in the last 168 hours.  No results for input(s): AMMONIA in the last 168 hours.  Coagulation Profile: No results for input(s): INR, PROTIME in the last 168 hours.  Cardiac Enzymes: No results for input(s): CKTOTAL, CKMB, CKMBINDEX, TROPONINI in the last 168 hours.  BNP (last 3 results) No results for input(s): PROBNP in the last 8760 hours.  Lipid Profile: No results for input(s): CHOL, HDL, LDLCALC, TRIG, CHOLHDL, LDLDIRECT in the last 72 hours.  Thyroid  Function Tests: No results for input(s): TSH, T4TOTAL, FREET4, T3FREE, THYROIDAB in the last 72 hours.  Anemia Panel: No results for input(s): VITAMINB12, FOLATE, FERRITIN, TIBC, IRON, RETICCTPCT in the last 72  hours.   Urine analysis:    Component Value Date/Time   COLORURINE YELLOW 07/27/2024 1726   APPEARANCEUR CLEAR 07/27/2024 1726   LABSPEC 1.044 (H) 07/27/2024 1726   PHURINE 5.0 07/27/2024 1726   GLUCOSEU NEGATIVE 07/27/2024 1726   HGBUR NEGATIVE 07/27/2024 1726   BILIRUBINUR NEGATIVE 07/27/2024 1726   BILIRUBINUR neg 12/09/2015 1701   KETONESUR NEGATIVE 07/27/2024 1726   PROTEINUR NEGATIVE 07/27/2024 1726   UROBILINOGEN 0.2 12/09/2015 1701   UROBILINOGEN 0.2 04/13/2014 1422   NITRITE NEGATIVE 07/27/2024 1726   LEUKOCYTESUR NEGATIVE 07/27/2024 1726    Sepsis Labs: Lactic Acid, Venous    Component Value Date/Time   LATICACIDVEN 1.0 07/27/2024 1628    MICROBIOLOGY: No results found for this or any previous visit (from the past 240 hours).  RADIOLOGY STUDIES/RESULTS: No results found.   LOS: 10  days   Donalda Applebaum, MD  [facility]    To contact the attending provider between 7A-7P or the covering provider during after hours 7P-7A, please log into the web site [email or web address] and access using universal [place] password for that web site. If you do not have the password, please call the hospital operator.  08/06/2024, 10:06 AM

## 2024-08-07 DIAGNOSIS — S82899S Other fracture of unspecified lower leg, sequela: Secondary | ICD-10-CM

## 2024-08-07 DIAGNOSIS — R410 Disorientation, unspecified: Secondary | ICD-10-CM

## 2024-08-07 DIAGNOSIS — I2602 Saddle embolus of pulmonary artery with acute cor pulmonale: Secondary | ICD-10-CM | POA: Diagnosis not present

## 2024-08-07 DIAGNOSIS — I10 Essential (primary) hypertension: Secondary | ICD-10-CM | POA: Diagnosis not present

## 2024-08-07 LAB — GLUCOSE, CAPILLARY
Glucose-Capillary: 105 mg/dL — ABNORMAL HIGH (ref 70–99)
Glucose-Capillary: 105 mg/dL — ABNORMAL HIGH (ref 70–99)
Glucose-Capillary: 107 mg/dL — ABNORMAL HIGH (ref 70–99)
Glucose-Capillary: 112 mg/dL — ABNORMAL HIGH (ref 70–99)

## 2024-08-07 MED ORDER — POLYETHYLENE GLYCOL 3350 17 G PO PACK: 17.0000 g | PACK | Freq: Every day | ORAL | Status: AC

## 2024-08-07 MED ORDER — BISACODYL 10 MG RE SUPP: 10.0000 mg | Freq: Every day | RECTAL | Status: AC | PRN

## 2024-08-07 MED ORDER — OYSTER SHELL CALCIUM/D3 500-5 MG-MCG PO TABS: 1.0000 | ORAL_TABLET | Freq: Two times a day (BID) | ORAL | Status: AC

## 2024-08-07 MED ORDER — QUETIAPINE FUMARATE 25 MG PO TABS: 25.0000 mg | ORAL_TABLET | Freq: Every day | ORAL | Status: AC

## 2024-08-07 MED ORDER — CYANOCOBALAMIN 1000 MCG/ML IJ SOLN: 1000.0000 ug | INTRAMUSCULAR | 0 refills | Status: AC

## 2024-08-07 MED ORDER — SENNOSIDES-DOCUSATE SODIUM 8.6-50 MG PO TABS: 1.0000 | ORAL_TABLET | Freq: Two times a day (BID) | ORAL | Status: AC

## 2024-08-07 NOTE — Progress Notes (Signed)
 PROGRESS NOTE        PATIENT DETAILS Name: Rachel Black Age: 73 y.o. Sex: female Date of Birth: 10-26-1950 Admit Date: 07/27/2024 Admitting Physician Mario Tobie GAILS, MD ERE:Nodnw, Toribio [name], MD  Brief Summary: Patient is a 73 y.o.  female who sustained a left trimalleolar ankle fracture-s/p ORIF on 7/9-subsequently discharged to SNF-when she was nonambulatory-recently seen in the ED on 11/5 for LLE DVT (refused thrombectomy) discharged back to SNF on Eliquis -presented to the ED on 11/13 with generalized pain-CTA chest showed submassive pulmonary embolism..  Significant events: 11/13>> admit to [facility]  Significant studies: 11/13>> CT abdomen/pelvis: B/L pulmonary emboli-no evidence of right heart strain.  Stable low-density within the left external iliac vein suspicious for thrombus. 11/13>> CTA chest: B/L pulmonary emboli with right heart strain.  Mild cardiomegaly. 11/14>> B/L LE Doppler:+ve DVT in multiple veins in LLE. 11/14>> echo: EF 50-55%, regional wall motion abnormalities, RV systolic function is low normal.  Significant microbiology data: None  Procedures: None  Consults: None  Subjective: No major issues overnight-lying comfortably in bed.  Objective: Vitals: Blood pressure 109/66, pulse 67, temperature 98.3 F (36.8 C), temperature source Oral, resp. rate 18, weight 84.4 kg, SpO2 97%.   Exam: Awake/alert Not in any distress Moving all 4 extremities-although has generalized weakness.  Pertinent Labs/Radiology:    Latest Ref Rng & Units 08/02/2024    2:42 AM 08/01/2024    3:19 AM 07/31/2024    2:35 AM  CBC  WBC 4.0 - 10.5 K/uL 7.9  7.3  7.5   Hemoglobin 12.0 - 15.0 g/dL 89.0  88.5  88.9   Hematocrit 36.0 - 46.0 % 33.8  36.2  34.0   Platelets 150 - 400 K/uL 152  140  147     Lab Results  Component Value Date   NA 140 08/03/2024   K 3.7 08/03/2024   CL 105 08/03/2024   CO2 23 08/03/2024      Assessment/Plan: Submassive  PE LLE DVT Provoked VTE in the setting of recent left ankle fracture and nonambulatory status Hemodynamically stable Initially on IV heparin -has been transitioned back to Eliquis .  Delirium No major issues overnight-awake/alert this morning Continue Seroquel  25 mg (dosage decreased to 25 mg due to excessive somnolence) Delirium precautions  Recent left ankle fracture-s/p ORIF 7/9 WBAT Mobilize is much as possible with PT/OT  HTN BP stable Imdur /olmesartan  on hold-resume when able.  HLD Fenofibrate   Gout Allopurinol   GERD PPI  Vitamin B12/folic acid  deficiency Repeat levels this morning are stable-continue supplementation.  Mood disorder Cymbalta   Constipation MiraLAX /senna As needed Dulcolax suppository Follow  Class 1 Obesity: Estimated body mass index is 30.03 kg/m as calculated from the following:   Height as of an earlier encounter on 07/27/24: 5' 6 (1.676 m).   Weight as of this encounter: 84.4 kg.   Code status:   Code Status: Full Code   DVT Prophylaxis: apixaban  (ELIQUIS ) tablet 5 mg     Family Communication: Spouse-Jerry [phone] called 11/19-left VM   Disposition Plan: Status is: Inpatient Remains inpatient appropriate because: Severity of illness   Planned Discharge Destination:Skilled nursing facility   Diet: Diet Order             Diet Heart Room service appropriate? Yes; Fluid consistency: Thin  Diet effective now  Antimicrobial agents: Anti-infectives (From admission, onward)    None        MEDICATIONS: Scheduled Meds:  allopurinol   300 mg Oral BID   apixaban   5 mg Oral BID   calcium -vitamin D   1 tablet Oral BID   cyanocobalamin   1,000 mcg Subcutaneous Q30 days   DULoxetine   60 mg Oral Daily   folic acid   1 mg Oral Daily   insulin  aspart  0-15 Units Subcutaneous Q4H   pantoprazole   40 mg Oral Daily   polyethylene glycol  17 g Oral Daily   QUEtiapine   25 mg Oral Q24H   rosuvastatin    40 mg Oral Daily   senna-docusate  1 tablet Oral BID   sodium chloride  flush  3 mL Intravenous Q12H   Continuous Infusions: PRN Meds:.acetaminophen  **OR** acetaminophen , bisacodyl , haloperidol  lactate, haloperidol  lactate, HYDROcodone -acetaminophen    I have personally reviewed following labs and imaging studies  LABORATORY DATA: CBC: Recent Labs  Lab 08/01/24 0319 08/02/24 0242  WBC 7.3 7.9  HGB 11.4* 10.9*  HCT 36.2 33.8*  MCV 100.8* 100.3*  PLT 140* 152    Basic Metabolic Panel: Recent Labs  Lab 08/01/24 0319 08/02/24 0242 08/03/24 0224  NA 142 140 140  K 4.7 3.7 3.7  CL 108 105 105  CO2 26 23 23   GLUCOSE 109* 122* 110*  BUN 9 12 13   CREATININE 1.00 0.86 0.66  CALCIUM  9.8 9.4 9.4  MG 1.9 1.5* 1.7  PHOS 4.2 3.1  --     GFR: Estimated Creatinine Clearance: 69.5 mL/min (by C-G formula based on SCr of 0.66 mg/dL).  Liver Function Tests: No results for input(s): AST, ALT, ALKPHOS, BILITOT, PROT, ALBUMIN  in the last 168 hours.  No results for input(s): LIPASE, AMYLASE in the last 168 hours.  No results for input(s): AMMONIA in the last 168 hours.  Coagulation Profile: No results for input(s): INR, PROTIME in the last 168 hours.  Cardiac Enzymes: No results for input(s): CKTOTAL, CKMB, CKMBINDEX, TROPONINI in the last 168 hours.  BNP (last 3 results) No results for input(s): PROBNP in the last 8760 hours.  Lipid Profile: No results for input(s): CHOL, HDL, LDLCALC, TRIG, CHOLHDL, LDLDIRECT in the last 72 hours.  Thyroid  Function Tests: No results for input(s): TSH, T4TOTAL, FREET4, T3FREE, THYROIDAB in the last 72 hours.  Anemia Panel: No results for input(s): VITAMINB12, FOLATE, FERRITIN, TIBC, IRON, RETICCTPCT in the last 72 hours.   Urine analysis:    Component Value Date/Time   COLORURINE YELLOW 07/27/2024 1726   APPEARANCEUR CLEAR 07/27/2024 1726   LABSPEC 1.044 (H) 07/27/2024  1726   PHURINE 5.0 07/27/2024 1726   GLUCOSEU NEGATIVE 07/27/2024 1726   HGBUR NEGATIVE 07/27/2024 1726   BILIRUBINUR NEGATIVE 07/27/2024 1726   BILIRUBINUR neg 12/09/2015 1701   KETONESUR NEGATIVE 07/27/2024 1726   PROTEINUR NEGATIVE 07/27/2024 1726   UROBILINOGEN 0.2 12/09/2015 1701   UROBILINOGEN 0.2 04/13/2014 1422   NITRITE NEGATIVE 07/27/2024 1726   LEUKOCYTESUR NEGATIVE 07/27/2024 1726    Sepsis Labs: Lactic Acid, Venous    Component Value Date/Time   LATICACIDVEN 1.0 07/27/2024 1628    MICROBIOLOGY: No results found for this or any previous visit (from the past 240 hours).  RADIOLOGY STUDIES/RESULTS: No results found.   LOS: 11 days   Donalda Applebaum, MD  [facility]    To contact the attending provider between 7A-7P or the covering provider during after hours 7P-7A, please log into the web site [email or web address] and access using universal  password  for that web site. If you do not have the password, please call the hospital operator.  08/07/2024, 9:15 AM

## 2024-08-07 NOTE — Plan of Care (Signed)
  Problem: Fluid Volume: Goal: Ability to maintain a balanced intake and output will improve Outcome: Progressing   Problem: Metabolic: Goal: Ability to maintain appropriate glucose levels will improve Outcome: Progressing   Problem: Nutritional: Goal: Maintenance of adequate nutrition will improve Outcome: Progressing

## 2024-08-07 NOTE — Discharge Summary (Addendum)
 PATIENT DETAILS Name: Rachel Black Age: 73 y.o. Sex: female Date of Birth: Mar 24, 1951 MRN: [id number]. Admitting Physician: Mario Tobie GAILS, MD ERE:Nodnw, Toribio [name], MD  Admit Date: 07/27/2024 Discharge date: 08/07/2024  Recommendations for Outpatient Follow-up:  Follow up with PCP in 1-2 weeks Please obtain CMP/CBC in one week  Admitted From:  SNF  Disposition: Skilled nursing facility   Discharge Condition: good  CODE STATUS:   Code Status: Full Code   Diet recommendation:  Diet Order             Diet - low sodium heart healthy           Diet Heart Room service appropriate? Yes; Fluid consistency: Thin  Diet effective now                    Brief Summary: Patient is a 73 y.o.  female who sustained a left trimalleolar ankle fracture-s/p ORIF on 7/9-subsequently discharged to SNF-when she was nonambulatory-recently seen in the ED on 11/5 for LLE DVT (refused thrombectomy) discharged back to SNF on Eliquis -presented to the ED on 11/13 with generalized pain-CTA chest showed submassive pulmonary embolism..   Significant events: 11/13>> admit to [facility]   Significant studies: 11/13>> CT abdomen/pelvis: B/L pulmonary emboli-no evidence of right heart strain.  Stable low-density within the left external iliac vein suspicious for thrombus. 11/13>> CTA chest: B/L pulmonary emboli with right heart strain.  Mild cardiomegaly. 11/14>> B/L LE Doppler:+ve DVT in multiple veins in LLE. 11/14>> echo: EF 50-55%, regional wall motion abnormalities, RV systolic function is low normal.   Significant microbiology data: None   Procedures: None   Consults: None  Brief Hospital Course: Submassive PE LLE DVT Provoked VTE in the setting of recent left ankle fracture and nonambulatory status Hemodynamically stable Initially on IV heparin -has been transitioned back to Eliquis .   Delirium No major issues overnight-awake/alert this morning Continue Seroquel  25 mg  Delirium  precautions   Recent left ankle fracture-s/p ORIF 7/9 WBAT Mobilize is much as possible with PT/OT at [facility] Ensure follow-up with orthopedics.   HTN BP stable Imdur /olmesartan  on hold-resume when able.   HLD Fenofibrate    Gout Allopurinol    GERD PPI   Vitamin B12/folic acid  deficiency Repeat levels are stable-continue supplementation.   Mood disorder Cymbalta    Constipation MiraLAX /senna As needed Dulcolax suppository Follow   Class 1 Obesity: Estimated body mass index is 30.03 kg/m as calculated from the following:   Height as of an earlier encounter on 07/27/24: 5' 6 (1.676 m).   Weight as of this encounter: 84.4 kg.   Pressure Ulcer: Agree with assessment as outlined below. Wound 08/03/24 0800 Pressure Injury Sacrum Medial Stage 1 -  Intact skin with non-blanchable redness of a localized area usually over a bony prominence. (Active)    Discharge Diagnoses:  Active Problems:   Acute pulmonary embolism [facility])   Discharge Instructions:  Activity:  As tolerated with Full fall precautions use walker/cane & assistance as needed  Discharge Instructions     Call MD for:  difficulty breathing, headache or visual disturbances   Complete by: As directed    Call MD for:  extreme fatigue   Complete by: As directed    Call MD for:  persistant dizziness or light-headedness   Complete by: As directed    Diet - low sodium heart healthy   Complete by: As directed    Discharge instructions   Complete by: As directed    Follow with Primary  MD  Chandra Toribio POUR, MD in 1-2 weeks  Please get a complete blood count and chemistry panel checked by your Primary MD at your next visit, and again as instructed by your Primary MD.  Get Medicines reviewed and adjusted: Please take all your medications with you for your next visit with your Primary MD  Laboratory/radiological data: Please request your Primary MD to go over all hospital tests and procedure/radiological results  at the follow up, please ask your Primary MD to get all Hospital records sent to his/her office.  In some cases, they will be blood work, cultures and biopsy results pending at the time of your discharge. Please request that your primary care M.D. follows up on these results.  Also Note the following: If you experience worsening of your admission symptoms, develop shortness of breath, life threatening emergency, suicidal or homicidal thoughts you must seek medical attention immediately by calling 911 or calling your MD immediately  if symptoms less severe.  You must read complete instructions/literature along with all the possible adverse reactions/side effects for all the Medicines you take and that have been prescribed to you. Take any new Medicines after you have completely understood and accpet all the possible adverse reactions/side effects.   Do not drive when taking Pain medications or sleeping medications (Benzodaizepines)  Do not take more than prescribed Pain, Sleep and Anxiety Medications. It is not advisable to combine anxiety,sleep and pain medications without talking with your primary care practitioner  Special Instructions: If you have smoked or chewed Tobacco  in the last 2 yrs please stop smoking, stop any regular Alcohol  and or any Recreational drug use.  Wear Seat belts while driving.  Please note: You were cared for by a hospitalist during your hospital stay. Once you are discharged, your primary care physician will handle any further medical issues. Please note that NO REFILLS for any discharge medications will be authorized once you are discharged, as it is imperative that you return to your primary care physician (or establish a relationship with a primary care physician if you do not have one) for your post hospital discharge needs so that they can reassess your need for medications and monitor your lab values.   Increase activity slowly   Complete by: As directed    No  wound care   Complete by: As directed       Allergies as of 08/07/2024       Reactions   Other Other (See Comments)   Some BANDAIDS cause skin irritation    Penicillins Itching   Tolerated Zosyn  couse 10/04/22        Medication List     STOP taking these medications    isosorbide  mononitrate 30 MG 24 hr tablet Commonly known as: IMDUR    olmesartan  5 MG tablet Commonly known as: BENICAR    potassium chloride  SA 20 MEQ tablet Commonly known as: KLOR-CON  M       TAKE these medications    Accu-Chek Aviva Plus test strip Generic drug: glucose blood USE AS INSTRUCTED ONCE A DAY. DX CODE E11.9   Accu-Chek Softclix Lancets lancets Use as instructed once a day.  DX Code: E11.9   acetaminophen  500 MG tablet Commonly known as: TYLENOL  Take 1,000 mg by mouth every 6 (six) hours as needed for mild pain or moderate pain.   allopurinol  300 MG tablet Commonly known as: ZYLOPRIM  TAKE 1 TABLET BY MOUTH TWICE A DAY   bisacodyl  10 MG suppository Commonly known as:  DULCOLAX Place 1 suppository (10 mg total) rectally daily as needed for moderate constipation.   calcium -vitamin D  500-5 MG-MCG tablet Commonly known as: OSCAL WITH D Take 1 tablet by mouth 2 (two) times daily.   cyanocobalamin  1000 MCG/ML injection Commonly known as: VITAMIN B12 Inject 1 mL (1,000 mcg total) into the skin every 30 (thirty) days. Start taking on: September 03, 2024   DULoxetine  60 MG capsule Commonly known as: CYMBALTA  Take 1 capsule (60 mg total) by mouth daily.   Eliquis  5 MG Tabs tablet Generic drug: apixaban  Take 5 mg by mouth 2 (two) times daily.   fenofibrate  160 MG tablet Take 1 tablet (160 mg total) by mouth daily.   folic acid  1 MG tablet Commonly known as: FOLVITE  Take 1 mg by mouth daily.   gabapentin  300 MG capsule Commonly known as: NEURONTIN  TAKE 1 CAPSULE BY MOUTH EVERYDAY AT BEDTIME   pantoprazole  40 MG tablet Commonly known as: PROTONIX  TAKE 1 TABLET BY MOUTH  EVERY DAY   polyethylene glycol 17 g packet Commonly known as: MIRALAX  / GLYCOLAX  Take 17 g by mouth daily. Start taking on: August 08, 2024   QUEtiapine  25 MG tablet Commonly known as: SEROquel  Take 1 tablet (25 mg total) by mouth at bedtime. What changed:  medication strength how much to take   rosuvastatin  40 MG tablet Commonly known as: CRESTOR  TAKE 1 TABLET BY MOUTH EVERY DAY   senna-docusate 8.6-50 MG tablet Commonly known as: Senokot-S Take 1 tablet by mouth 2 (two) times daily.        Follow-up Information     Chandra Toribio POUR, MD. Schedule an appointment as soon as possible for a visit in 1 week(s).   Specialty: Family Medicine Contact information: [address] [place] [address] [phone]         Barton Drape, MD. Schedule an appointment as soon as possible for a visit in 1 week(s).   Specialty: Orthopedic Surgery Contact information: [address], [address] [place] [address] [phone]                Allergies  Allergen Reactions   Other Other (See Comments)    Some BANDAIDS cause skin irritation    Penicillins Itching     Tolerated Zosyn  couse 10/04/22     Other Procedures/Studies: ECHOCARDIOGRAM COMPLETE Result Date: 07/28/2024    ECHOCARDIOGRAM REPORT   Patient Name:   Rachel Black Date of Exam: 07/28/2024 Medical Rec #:  [id number]       Height:       66.0 in Accession #:    [id number]      Weight:       192.5 lb Date of Birth:  [birth year]       BSA:          1.967 m Patient Age:    73 years        BP:           122/64 mmHg Patient Gender: F               HR:           86 bpm. Exam Location:  Inpatient Procedure: 2D Echo, Color Doppler and Cardiac Doppler (Both Spectral and Color            Flow Doppler were utilized during procedure). Indications:    Pulmonary Embolus  History:        Patient has prior history of Echocardiogram examinations, most  recent 10/17/2017. Risk Factors:Diabetes,  Dyslipidemia and                 Hypertension.  Sonographer:    Tinnie Gosling RDCS Referring Phys: [phone] EKTA V PATEL IMPRESSIONS  1. Left ventricular ejection fraction, by estimation, is 50 to 55%. The left ventricle has low normal function. The left ventricle demonstrates regional wall motion abnormalities (see scoring diagram/findings for description). Indeterminate diastolic filling due to E-A fusion.  2. Right ventricular systolic function is low normal. The right ventricular size is normal.  3. The mitral valve is normal in structure. Trivial mitral valve regurgitation. No evidence of mitral stenosis.  4. The aortic valve is normal in structure. Aortic valve regurgitation is not visualized. No aortic stenosis is present.  5. The inferior vena cava is normal in size with greater than 50% respiratory variability, suggesting right atrial pressure of 3 mmHg. Comparison(s): A prior study was performed on 10/17/2017. Prior images unable to be directly viewed, comparison made by report only. EF 55-60% without regional wall motion abnormalities. Grade I diastolic dysfunction. FINDINGS  Left Ventricle: Left ventricular ejection fraction, by estimation, is 50 to 55%. The left ventricle has low normal function. The left ventricle demonstrates regional wall motion abnormalities. The left ventricular internal cavity size was normal in size. There is no left ventricular hypertrophy. Indeterminate diastolic filling due to E-A fusion.  LV Wall Scoring: The entire inferior wall is hypokinetic. Right Ventricle: The right ventricular size is normal. No increase in right ventricular wall thickness. Right ventricular systolic function is low normal. Left Atrium: Left atrial size was normal in size. Right Atrium: Right atrial size was normal in size. Pericardium: There is no evidence of pericardial effusion. Mitral Valve: The mitral valve is normal in structure. Trivial mitral valve regurgitation. No evidence of mitral valve  stenosis. Tricuspid Valve: The tricuspid valve is normal in structure. Tricuspid valve regurgitation is trivial. No evidence of tricuspid stenosis. Aortic Valve: The aortic valve is normal in structure. Aortic valve regurgitation is not visualized. No aortic stenosis is present. Pulmonic Valve: The pulmonic valve was normal in structure. Pulmonic valve regurgitation is not visualized. No evidence of pulmonic stenosis. Aorta: The aortic root and ascending aorta are structurally normal, with no evidence of dilitation. Venous: The inferior vena cava is normal in size with greater than 50% respiratory variability, suggesting right atrial pressure of 3 mmHg. IAS/Shunts: No atrial level shunt detected by color flow Doppler.  LEFT VENTRICLE PLAX 2D LVIDd:         4.60 cm   Diastology LVIDs:         3.60 cm   LV e' medial:    6.42 cm/s LV PW:         1.10 cm   LV E/e' medial:  10.4 LV IVS:        1.00 cm   LV e' lateral:   12.60 cm/s LVOT diam:     2.40 cm   LV E/e' lateral: 5.3 LV SV:         82 LV SV Index:   42 LVOT Area:     4.52 cm LV IVRT:       135 msec  RIGHT VENTRICLE             IVC RV S prime:     18.70 cm/s  IVC diam: 1.50 cm TAPSE (M-mode): 1.7 cm LEFT ATRIUM             Index  RIGHT ATRIUM          Index LA diam:        3.80 cm 1.93 cm/m   RA Area:     9.51 cm LA Vol (A2C):   46.1 ml 23.43 ml/m  RA Volume:   15.60 ml 7.93 ml/m LA Vol (A4C):   41.4 ml 21.04 ml/m LA Biplane Vol: 46.7 ml 23.74 ml/m  AORTIC VALVE LVOT Vmax:   107.00 cm/s LVOT Vmean:  73.300 cm/s LVOT VTI:    0.181 m  AORTA Ao Root diam: 3.60 cm Ao Asc diam:  3.40 cm MITRAL VALVE MV Area (PHT): 4.60 cm     SHUNTS MV Decel Time: 165 msec     Systemic VTI:  0.18 m MV E velocity: 66.90 cm/s   Systemic Diam: 2.40 cm MV A velocity: 127.00 cm/s MV E/A ratio:  0.53 Emeline Calender Electronically signed by Emeline Calender Signature Date/Time: 07/28/2024/4:48:47 PM    Final    VAS US  LOWER EXTREMITY VENOUS (DVT) Result Date: 07/28/2024  Lower  Venous DVT Study Patient Name:  CLORIA CIRESI  Date of Exam:   07/28/2024 Medical Rec #: [id number]        Accession #:    [id number] Date of Birth: May 29, 1951        Patient Gender: F Patient Age:   16 years Exam Location:  [facility] Procedure:      VAS US  LOWER EXTREMITY VENOUS (DVT) Referring Phys: BRAYTON LYE --------------------------------------------------------------------------------  Indications: Swelling, and History of left leg DVT on 07/19/24. Other Indications: Trauma LLE ankle fracture s/p surgical repair on 03/22/2024. Performing Technologist: Ricka Sturdivant-Jones RDMS, RVT  Examination Guidelines: A complete evaluation includes B-mode imaging, spectral Doppler, color Doppler, and power Doppler as needed of all accessible portions of each vessel. Bilateral testing is considered an integral part of a complete examination. Limited examinations for reoccurring indications may be performed as noted. The reflux portion of the exam is performed with the patient in reverse Trendelenburg.  +---------+---------------+---------+-----------+----------+-----------------+ RIGHT    CompressibilityPhasicitySpontaneityPropertiesThrombus Aging    +---------+---------------+---------+-----------+----------+-----------------+ CFV      Full           Yes      Yes                                    +---------+---------------+---------+-----------+----------+-----------------+ SFJ      Full                                                           +---------+---------------+---------+-----------+----------+-----------------+ FV Prox  Full                                                           +---------+---------------+---------+-----------+----------+-----------------+ FV Mid   Full           Yes      Yes                                    +---------+---------------+---------+-----------+----------+-----------------+  FV DistalFull                                                            +---------+---------------+---------+-----------+----------+-----------------+ PFV      Full                                                           +---------+---------------+---------+-----------+----------+-----------------+ POP      Full           Yes      Yes                                    +---------+---------------+---------+-----------+----------+-----------------+ PTV      Full                                                           +---------+---------------+---------+-----------+----------+-----------------+ PERO                                                  poorly visualized +---------+---------------+---------+-----------+----------+-----------------+   +---------+---------------+---------+-----------+----------+-----------------+ LEFT     CompressibilityPhasicitySpontaneityPropertiesThrombus Aging    +---------+---------------+---------+-----------+----------+-----------------+ CFV      Partial        Yes      No                   Age Indeterminate +---------+---------------+---------+-----------+----------+-----------------+ SFJ      Partial                                                        +---------+---------------+---------+-----------+----------+-----------------+ FV Prox  Partial                                      Chronic           +---------+---------------+---------+-----------+----------+-----------------+ FV Mid   Full           No       No                                     +---------+---------------+---------+-----------+----------+-----------------+ FV DistalFull           Yes      Yes                                    +---------+---------------+---------+-----------+----------+-----------------+ PFV  Full                                                           +---------+---------------+---------+-----------+----------+-----------------+ POP      Partial         Yes      Yes                                    +---------+---------------+---------+-----------+----------+-----------------+ PTV      Partial                                      Chronic           +---------+---------------+---------+-----------+----------+-----------------+ PERO     Partial                                      Chronic           +---------+---------------+---------+-----------+----------+-----------------+ Poorly visualized calf veins.    Summary: RIGHT: - There is no evidence of deep vein thrombosis in the lower extremity. However, portions of this examination were limited- see technologist comments above.  - No cystic structure found in the popliteal fossa.  LEFT: - Findings consistent with age indeterminate deep vein thrombosis involving the left common femoral vein.  - Findings consistent with chronic deep vein thrombosis involving the left femoral vein, left popliteal vein, left posterior tibial veins, and left peroneal veins.  - Findings appear improved from previous examination.  *See table(s) above for measurements and observations. Electronically signed by Debby Robertson on 07/28/2024 at 11:11:02 AM.    Final    CT ABDOMEN PELVIS W CONTRAST Result Date: 07/27/2024 CLINICAL DATA:  Abdominal pain.  Possible pulmonary embolism. EXAM: CT ANGIOGRAPHY CHEST CT ABDOMEN AND PELVIS WITH CONTRAST TECHNIQUE: Multidetector CT imaging of the chest was performed using the standard protocol during bolus administration of intravenous contrast. Multiplanar CT image reconstructions and MIPs were obtained to evaluate the vascular anatomy. Multidetector CT imaging of the abdomen and pelvis was performed using the standard protocol during bolus administration of intravenous contrast. RADIATION DOSE REDUCTION: This exam was performed according to the departmental dose-optimization program which includes automated exposure control, adjustment of the mA and/or kV according to patient size  and/or use of iterative reconstruction technique. CONTRAST:  59mL OMNIPAQUE  IOHEXOL  350 MG/ML SOLN COMPARISON:  CT abdomen/pelvis 07/19/2024 and CTA chest 03/16/2022 FINDINGS: CTA CHEST FINDINGS Cardiovascular: Mild cardiomegaly slightly worse. The thoracic aorta is normal in caliber and otherwise unremarkable. Pulmonary arterial system is adequately opacified demonstrates a linear embolus over the left main pulmonary artery with small embolus over a proximal lingular pulmonary artery. Small emboli are present over the proximal right upper, middle and lower lobar pulmonary arteries. RV/LV ratio = 52/41 compatible with right heart strain. Remaining vascular structures are unremarkable. Mediastinum/Nodes: No evidence of mediastinal or hilar adenopathy. Remaining mediastinal structures are unremarkable. Lungs/Pleura: Lungs are adequately inflated without acute airspace process or effusion. Airways are unremarkable. Musculoskeletal: No focal abnormality. Review of the MIP images confirms the above findings. CT ABDOMEN and PELVIS FINDINGS Hepatobiliary: Previous cholecystectomy. Best Boy  within the biliary tree and stable prominence of the common bile duct unchanged likely due to patient's post cholecystectomy state. No focal liver mass. Pancreas: Normal. Spleen: Normal. Adrenals/Urinary Tract: Adrenal glands are normal. Kidneys are normal in size. No significant hydronephrosis or nephrolithiasis. Stable 1.5 cm left renal cyst. No further imaging follow-up is recommended. Ureters and bladder are normal. Stomach/Bowel: Stomach and small bowel are normal. Appendix is normal. Colon is decompressed from the a Paddock flexure distally. There is mild diverticulosis of the colon. Vascular/Lymphatic: Abdominal aorta is normal in caliber. Remaining vascular structures again notable for low-density within the left external iliac vein suspicious for thrombus. No adenopathy. Reproductive: Status post hysterectomy. No adnexal masses.  Other: No free fluid or focal inflammatory change. Tiny ventral hernia over the epigastric region containing only peritoneal fat. Evidence of previous ventral hernia repair in the midline of the supraumbilical region. Musculoskeletal: Stable grade 1 anterolisthesis of L3 on L4. No focal abnormalities. Review of the MIP images confirms the above findings. IMPRESSION: 1. Evidence of bilateral pulmonary emboli. Evidence of right heart strain. 2. Mild cardiomegaly slightly worse. 3. No acute findings in the abdomen/pelvis. 4. Mild colonic diverticulosis. 5. Stable low-density within the left external iliac vein suspicious for thrombus as described previously. Continue to recommend left lower extremity venous ultrasound for further evaluation if not done. 6. Stable grade 1 anterolisthesis of L3 on L4. Critical Value/emergent results were called by telephone at the time of interpretation on 07/27/2024 at 2:50 pm to provider ANDREW TEE , who verbally acknowledged these results. Electronically Signed   By: Toribio Agreste M.D.   On: 07/27/2024 14:50   CT Angio Chest PE W and/or Wo Contrast Result Date: 07/27/2024 CLINICAL DATA:  Abdominal pain.  Possible pulmonary embolism. EXAM: CT ANGIOGRAPHY CHEST CT ABDOMEN AND PELVIS WITH CONTRAST TECHNIQUE: Multidetector CT imaging of the chest was performed using the standard protocol during bolus administration of intravenous contrast. Multiplanar CT image reconstructions and MIPs were obtained to evaluate the vascular anatomy. Multidetector CT imaging of the abdomen and pelvis was performed using the standard protocol during bolus administration of intravenous contrast. RADIATION DOSE REDUCTION: This exam was performed according to the departmental dose-optimization program which includes automated exposure control, adjustment of the mA and/or kV according to patient size and/or use of iterative reconstruction technique. CONTRAST:  59mL OMNIPAQUE  IOHEXOL  350 MG/ML SOLN  COMPARISON:  CT abdomen/pelvis 07/19/2024 and CTA chest 03/16/2022 FINDINGS: CTA CHEST FINDINGS Cardiovascular: Mild cardiomegaly slightly worse. The thoracic aorta is normal in caliber and otherwise unremarkable. Pulmonary arterial system is adequately opacified demonstrates a linear embolus over the left main pulmonary artery with small embolus over a proximal lingular pulmonary artery. Small emboli are present over the proximal right upper, middle and lower lobar pulmonary arteries. RV/LV ratio = 52/41 compatible with right heart strain. Remaining vascular structures are unremarkable. Mediastinum/Nodes: No evidence of mediastinal or hilar adenopathy. Remaining mediastinal structures are unremarkable. Lungs/Pleura: Lungs are adequately inflated without acute airspace process or effusion. Airways are unremarkable. Musculoskeletal: No focal abnormality. Review of the MIP images confirms the above findings. CT ABDOMEN and PELVIS FINDINGS Hepatobiliary: Previous cholecystectomy. Air within the biliary tree and stable prominence of the common bile duct unchanged likely due to patient's post cholecystectomy state. No focal liver mass. Pancreas: Normal. Spleen: Normal. Adrenals/Urinary Tract: Adrenal glands are normal. Kidneys are normal in size. No significant hydronephrosis or nephrolithiasis. Stable 1.5 cm left renal cyst. No further imaging follow-up is recommended. Ureters and bladder are normal. Stomach/Bowel:  Stomach and small bowel are normal. Appendix is normal. Colon is decompressed from the a Paddock flexure distally. There is mild diverticulosis of the colon. Vascular/Lymphatic: Abdominal aorta is normal in caliber. Remaining vascular structures again notable for low-density within the left external iliac vein suspicious for thrombus. No adenopathy. Reproductive: Status post hysterectomy. No adnexal masses. Other: No free fluid or focal inflammatory change. Tiny ventral hernia over the epigastric region  containing only peritoneal fat. Evidence of previous ventral hernia repair in the midline of the supraumbilical region. Musculoskeletal: Stable grade 1 anterolisthesis of L3 on L4. No focal abnormalities. Review of the MIP images confirms the above findings. IMPRESSION: 1. Evidence of bilateral pulmonary emboli. Evidence of right heart strain. 2. Mild cardiomegaly slightly worse. 3. No acute findings in the abdomen/pelvis. 4. Mild colonic diverticulosis. 5. Stable low-density within the left external iliac vein suspicious for thrombus as described previously. Continue to recommend left lower extremity venous ultrasound for further evaluation if not done. 6. Stable grade 1 anterolisthesis of L3 on L4. Critical Value/emergent results were called by telephone at the time of interpretation on 07/27/2024 at 2:50 pm to provider ANDREW TEE , who verbally acknowledged these results. Electronically Signed   By: Toribio Agreste M.D.   On: 07/27/2024 14:50   VAS US  LOWER EXTREMITY VENOUS (DVT) (7a-7p) Result Date: 07/19/2024  Lower Venous DVT Study Patient Name:  Rachel Black  Date of Exam:   07/19/2024 Medical Rec #: [id number]        Accession #:    [id number] Date of Birth: [birth year]        Patient Gender: F Patient Age:   56 years Exam Location:  [facility] Procedure:      VAS US  LOWER EXTREMITY VENOUS (DVT) Referring Phys: FONDA ZAVITZ --------------------------------------------------------------------------------  Indications: Pain.  Risk Factors: Trauma LLE ankle fracture s/p surgical repair on 03/22/2024. Patient has been in rehab with decreased mobility. Limitations: Body habitus and poor ultrasound/tissue interface. Comparison Study: Previous exam on 01/14/2023 was negative for DVT Performing Technologist: Ezzie Potters RVT, RDMS  Examination Guidelines: A complete evaluation includes B-mode imaging, spectral Doppler, color Doppler, and power Doppler as needed of all accessible portions of each vessel. Bilateral  testing is considered an integral part of a complete examination. Limited examinations for reoccurring indications may be performed as noted. The reflux portion of the exam is performed with the patient in reverse Trendelenburg.  +---------+---------------+---------+-----------+----------+--------------+ RIGHT    CompressibilityPhasicitySpontaneityPropertiesThrombus Aging +---------+---------------+---------+-----------+----------+--------------+ CFV      Full           Yes      Yes                                 +---------+---------------+---------+-----------+----------+--------------+ SFJ      Full                                                        +---------+---------------+---------+-----------+----------+--------------+ FV Prox  Full           Yes      Yes                                 +---------+---------------+---------+-----------+----------+--------------+ FV Mid  Full           Yes      Yes                                 +---------+---------------+---------+-----------+----------+--------------+ FV DistalFull           Yes      Yes                                 +---------+---------------+---------+-----------+----------+--------------+ PFV      Full                                                        +---------+---------------+---------+-----------+----------+--------------+ POP      Full                                                        +---------+---------------+---------+-----------+----------+--------------+ PTV      Full                                                        +---------+---------------+---------+-----------+----------+--------------+   +---------+---------------+---------+-----------+---------------+--------------+ LEFT     CompressibilityPhasicitySpontaneityProperties     Thrombus Aging +---------+---------------+---------+-----------+---------------+--------------+ CFV      None           No        No                        Acute          +---------+---------------+---------+-----------+---------------+--------------+ SFJ      Partial                                           Acute          +---------+---------------+---------+-----------+---------------+--------------+ FV Prox  Partial        Yes      Yes                       Acute          +---------+---------------+---------+-----------+---------------+--------------+ FV Mid   Full           No       Yes        continuous flow               +---------+---------------+---------+-----------+---------------+--------------+ FV DistalNone           No       Yes        continuous flowAcute          +---------+---------------+---------+-----------+---------------+--------------+ PFV      Partial        Yes      Yes  Acute          +---------+---------------+---------+-----------+---------------+--------------+ POP      Partial        Yes      Yes                       Acute          +---------+---------------+---------+-----------+---------------+--------------+ GSV      None                                              Acute          +---------+---------------+---------+-----------+---------------+--------------+ EIV      Partial        Yes      Yes                       Acute          +---------+---------------+---------+-----------+---------------+--------------+     Summary: BILATERAL: -No evidence of popliteal cyst, bilaterally. RIGHT: - There is no evidence of deep vein thrombosis in the lower extremity.  LEFT: - Findings consistent with acute deep vein thrombosis involving the left common femoral vein, SF junction, left femoral vein, left popliteal vein, left proximal profunda vein, and external iliac vein. - Findings consistent with acute superficial vein thrombosis involving the left great saphenous vein.   *See table(s) above for measurements and observations.  Electronically signed by Gaile New MD on 07/19/2024 at 5:36:43 PM.    Final    CT ABDOMEN PELVIS W CONTRAST Result Date: 07/19/2024 CLINICAL DATA:  Abdominal pain, decreased appetite. EXAM: CT ABDOMEN AND PELVIS WITH CONTRAST TECHNIQUE: Multidetector CT imaging of the abdomen and pelvis was performed using the standard protocol following bolus administration of intravenous contrast. RADIATION DOSE REDUCTION: This exam was performed according to the departmental dose-optimization program which includes automated exposure control, adjustment of the mA and/or kV according to patient size and/or use of iterative reconstruction technique. CONTRAST:  75mL OMNIPAQUE  IOHEXOL  350 MG/ML SOLN COMPARISON:  03/15/2024. FINDINGS: Lower chest: Mild dependent atelectasis bilaterally. Heart is enlarged. Trace pericardial fluid is likely physiologic. No pleural effusion. Distal esophagus is grossly unremarkable. Hepatobiliary: Liver is enlarged, 18.6 cm. Margin is somewhat irregular. Chronic pneumobilia. Cholecystectomy. No unexpected biliary ductal dilatation. Pancreas: Negative. Spleen: Negative. Adrenals/Urinary Tract: Adrenal glands are unremarkable. Small low-attenuation lesions in the kidneys. No specific follow-up necessary. Ureters are decompressed. Trace amount of excreted contrast in the bladder. Bladder is low in volume. Stomach/Bowel: There may be a tiny hiatal hernia. Stomach, small bowel, appendix and colon are unremarkable. Vascular/Lymphatic: Long segment low-attenuation within the left external iliac vein and femoral vein bifurcation. Vascular structures are otherwise unremarkable. No pathologically enlarged lymph nodes. Reproductive: Hysterectomy.  No adnexal mass. Other: Mild nonspecific presacral edema. Mesenteries and peritoneum are otherwise unremarkable. Musculoskeletal: Osteopenia. Degenerative changes in the spine. Grade 1 anterolisthesis of L3 on L4 and L4 on L5. IMPRESSION: 1. Low-attenuation within  the left external iliac vein, extending to the femoral vein bifurcation, worrisome for deep venous thrombosis. Recommend Doppler ultrasound of the left lower extremity. Critical Value/emergent results were called by telephone at the time of interpretation on 07/19/2024 at 2:08 pm to provider [facility] , who verbally acknowledged these results. 2. Liver is mildly enlarged and appears cirrhotic. Electronically Signed   By: Newell Eke M.D.   On: 07/19/2024  14:08   DG Ankle Left Port Result Date: 07/19/2024 CLINICAL DATA:  Left ankle pain chronically. EXAM: PORTABLE LEFT ANKLE - 2 VIEW COMPARISON:  07/15/2024 FINDINGS: There is diffuse decreased bone mineralization. Fixation hardware over the distal tibia and fibula intact and unchanged. No acute fracture or dislocation. Ankle mortise unchanged. Degenerative changes over the tibiotalar joint and midfoot. Inferior calcaneal spur. IMPRESSION: 1. No acute findings. 2. Degenerative changes of the ankle and midfoot. Electronically Signed   By: Toribio Agreste M.D.   On: 07/19/2024 12:36   DG Ankle Right Port Result Date: 07/19/2024 CLINICAL DATA:  Left ankle pain chronically. EXAM: PORTABLE RIGHT ANKLE - 2 VIEW COMPARISON:  None Available. FINDINGS: There is diffuse decreased bone mineralization. Degenerative changes over the ankle mortise and tibiotalar joint. No acute fracture or dislocation. Mild degenerate change over the midfoot. Small inferior calcaneal spur. Soft tissues are unremarkable. IMPRESSION: 1. No acute findings. 2. Degenerative changes as described. Electronically Signed   By: Toribio Agreste M.D.   On: 07/19/2024 12:34   DG Foot Complete Left Result Date: 07/15/2024 EXAM: 3 OR MORE VIEW(S) XRAY OF THE LEFT FOOT 07/15/2024 03:22:00 PM COMPARISON: None available. CLINICAL HISTORY: fall pain FINDINGS: BONES AND JOINTS: Diffuse osteopenia is noted. Moderate hallux valgus deformity of the first metatarsophalangeal joint is noted. Status post  surgical fixation of distal left tibial and fibular fractures. Mild posterior calcaneal spurring is noted. No acute fracture. No joint dislocation. SOFT TISSUES: Mild diffuse soft tissue swelling is noted suggesting edema. IMPRESSION: 1. Status post surgical fixation  of distal left tibial and fibular fractures. 2. Mild diffuse soft tissue swelling. 3. Moderate hallux valgus deformity of the first metatarsophalangeal joint. 4. Mild posterior calcaneal spurring. 5. Diffuse osteopenia. Electronically signed by: Lynwood Seip MD 07/15/2024 03:33 PM EDT RP Workstation: [id number]   DG Ankle Complete Left Result Date: 07/15/2024 EXAM: 3 OR MORE VIEW(S) XRAY OF THE LEFT ANKLE 07/15/2024 03:22:00 PM CLINICAL HISTORY: fall, L ankle pain. COMPARISON: 03/16/2024 FINDINGS: BONES AND JOINTS: Status post surgical interfixation of distal left tibial and fibular fractures. Diffuse osteopenia is noted. No acute fracture. No focal osseous lesion. No joint dislocation. SOFT TISSUES: Vascular calcifications are noted. IMPRESSION: 1. No acute osseous abnormality. 2. Status post surgical fixation of distal left tibial and fibular fractures. 3. Diffuse osteopenia. 4. Vascular calcifications. Electronically signed by: Lynwood Seip MD 07/15/2024 03:30 PM EDT RP Workstation: [id number]     TODAY-DAY OF DISCHARGE:  Subjective:   Rachel Black today has no headache,no chest abdominal pain,no new weakness tingling or numbness, feels much better wants to go home today.   Objective:   Blood pressure 109/66, pulse 67, temperature 98.3 F (36.8 C), temperature source Oral, resp. rate 18, weight 84.4 kg, SpO2 97%.  Intake/Output Summary (Last 24 hours) at 08/07/2024 1041 Last data filed at 08/07/2024 0420 Gross per 24 hour  Intake 220 ml  Output 600 ml  Net -380 ml   Filed Weights   08/03/24 0500 08/04/24 0500 08/05/24 0500  Weight: 86.9 kg 87 kg 84.4 kg    Exam: Awake Alert, Oriented *3, No new F.N deficits, Normal  affect [place].AT,PERRAL Supple Neck,No JVD, No cervical lymphadenopathy appriciated.  Symmetrical Chest wall movement, Good air movement bilaterally, CTAB RRR,No Gallops,Rubs or new Murmurs, No Parasternal Heave +ve B.Sounds, Abd Soft, Non tender, No organomegaly appriciated, No rebound -guarding or rigidity. No Cyanosis, Clubbing or edema, No new Rash or bruise   PERTINENT RADIOLOGIC STUDIES: No results found.   PERTINENT LAB  RESULTS: CBC: No results for input(s): WBC, HGB, HCT, PLT in the last 72 hours. CMET CMP     Component Value Date/Time   NA 140 08/03/2024 0224   NA 141 05/06/2023 0858   K 3.7 08/03/2024 0224   CL 105 08/03/2024 0224   CO2 23 08/03/2024 0224   GLUCOSE 110 (H) 08/03/2024 0224   BUN 13 08/03/2024 0224   BUN 23 05/06/2023 0858   CREATININE 0.66 08/03/2024 0224   CALCIUM  9.4 08/03/2024 0224   PROT 4.3 (L) 07/28/2024 0352   PROT 5.7 (L) 05/06/2023 0858   ALBUMIN  2.2 (L) 07/28/2024 0352   ALBUMIN  4.2 05/06/2023 0858   AST 14 (L) 07/28/2024 0352   ALT 9 07/28/2024 0352   ALKPHOS 48 07/28/2024 0352   BILITOT 0.8 07/28/2024 0352   BILITOT 0.4 05/06/2023 0858   GFR 39.40 (L) 12/21/2017 1044   EGFR 55 (L) 05/06/2023 0858   GFRNONAA >60 08/03/2024 0224    GFR Estimated Creatinine Clearance: 69.5 mL/min (by C-G formula based on SCr of 0.66 mg/dL). No results for input(s): LIPASE, AMYLASE in the last 72 hours. No results for input(s): CKTOTAL, CKMB, CKMBINDEX, TROPONINI in the last 72 hours. Invalid input(s): POCBNP No results for input(s): DDIMER in the last 72 hours. No results for input(s): HGBA1C in the last 72 hours. No results for input(s): CHOL, HDL, LDLCALC, TRIG, CHOLHDL, LDLDIRECT in the last 72 hours. No results for input(s): TSH, T4TOTAL, T3FREE, THYROIDAB in the last 72 hours.  Invalid input(s): FREET3 No results for input(s): VITAMINB12, FOLATE, FERRITIN, TIBC, IRON, RETICCTPCT in  the last 72 hours. Coags: No results for input(s): INR in the last 72 hours.  Invalid input(s): PT Microbiology: No results found for this or any previous visit (from the past 240 hours).  FURTHER DISCHARGE INSTRUCTIONS:  Get Medicines reviewed and adjusted: Please take all your medications with you for your next visit with your Primary MD  Laboratory/radiological data: Please request your Primary MD to go over all hospital tests and procedure/radiological results at the follow up, please ask your Primary MD to get all Hospital records sent to his/her office.  In some cases, they will be blood work, cultures and biopsy results pending at the time of your discharge. Please request that your primary care M.D. goes through all the records of your hospital data and follows up on these results.  Also Note the following: If you experience worsening of your admission symptoms, develop shortness of breath, life threatening emergency, suicidal or homicidal thoughts you must seek medical attention immediately by calling 911 or calling your MD immediately  if symptoms less severe.  You must read complete instructions/literature along with all the possible adverse reactions/side effects for all the Medicines you take and that have been prescribed to you. Take any new Medicines after you have completely understood and accpet all the possible adverse reactions/side effects.   Do not drive when taking Pain medications or sleeping medications (Benzodaizepines)  Do not take more than prescribed Pain, Sleep and Anxiety Medications. It is not advisable to combine anxiety,sleep and pain medications without talking with your primary care practitioner  Special Instructions: If you have smoked or chewed Tobacco  in the last 2 yrs please stop smoking, stop any regular Alcohol  and or any Recreational drug use.  Wear Seat belts while driving.  Please note: You were cared for by a hospitalist during your  hospital stay. Once you are discharged, your primary care physician will handle any further  medical issues. Please note that NO REFILLS for any discharge medications will be authorized once you are discharged, as it is imperative that you return to your primary care physician (or establish a relationship with a primary care physician if you do not have one) for your post hospital discharge needs so that they can reassess your need for medications and monitor your lab values.  Total Time spent coordinating discharge including counseling, education and face to face time equals greater than 30 minutes.  SignedBETHA Donalda Applebaum 08/07/2024 10:41 AM

## 2024-08-07 NOTE — TOC Transition Note (Signed)
 Transition of Care [facility]) - Discharge Note   Patient Details  Name: Rachel Black MRN: [id number] Date of Birth: 06-10-1951  [facility]) CM/SW Contact:  Inocente GORMAN Kindle, LCSW Phone Number: 08/07/2024, 12:53 PM   Clinical Narrative:    Patient will DC to: [facility] Anticipated DC date: 08/07/24 Family notified: Spouse Transport by: [facility]   Per MD patient ready for DC to [facility]. RN to call report prior to discharge ( [phone] room 405a). RN, patient, patient's family, and facility notified of DC. Discharge Summary and FL2 sent to facility. DC packet on chart. Ambulance transport requested for patient.   CSW will sign off for now as social work intervention is no longer needed. Please consult us  again if new needs arise.     Final next level of care: Skilled Nursing Facility Barriers to Discharge: Barriers Resolved   Patient Goals and CMS Choice Patient states their goals for this hospitalization and ongoing recovery are:: Facility placement CMS Medicare.gov Compare Post Acute Care list provided to:: Patient Represenative (must comment) Choice offered to / list presented to : Spouse, Patient [place] ownership interest in [facility].provided to:: Spouse    Discharge Placement   Existing PASRR number confirmed : 08/07/24          Patient chooses bed at: [facility], [place] Patient to be transferred to facility by: [facility] Name of family member notified: Spouse Patient and family notified of of transfer: 08/07/24  Discharge Plan and Services Additional resources added to the After Visit Summary for   In-house Referral: Clinical Social Work   Post Acute Care Choice: Skilled Nursing Facility                               Social Drivers of Health (SDOH) Interventions SDOH Screenings   Food Insecurity: No Food Insecurity (07/27/2024)  Housing: Low Risk  (07/27/2024)  Transportation Needs: No Transportation Needs (07/27/2024)   Utilities: Not At Risk (07/27/2024)  Depression (PHQ2-9): High Risk (07/27/2024)  Social Connections: Moderately Integrated (07/27/2024)  Tobacco Use: Low Risk  (07/27/2024)     Readmission Risk Interventions    03/28/2024   10:35 AM  Readmission Risk Prevention Plan  Transportation Screening Complete  PCP or Specialist Appt within 5-7 Days Complete  Home Care Screening Complete  Medication Review (RN CM) Complete

## 2024-08-07 NOTE — Progress Notes (Signed)
 Rachel Black to be discharged to [facility] per MD order. Report called to Mia, LPN a [facility]. Skin clean and dry, stage 1 on sacrum with foam dressing intact. IV catheter discontinued intact. Site without signs and symptoms of complications. Dressing and pressure applied.  An After Visit Summary was printed and given to [facility].   Rachel Black  08/07/2024

## 2024-08-07 NOTE — TOC Progression Note (Addendum)
 Transition of Care [facility]) - Progression Note    Patient Details  Name: Rachel Black MRN: [id number] Date of Birth: March 21, 1951  Transition [facility] Care [facility]) CM/SW Contact  Inocente GORMAN Kindle, LCSW Phone Number: 08/07/2024, 9:04 AM  Clinical Narrative:    9:05 AM-Awaiting confirmation from [facility] that they can accept patient today. CSW received vm from spouse stating that he is at work. CSW returned call and let him know that he needs to contact [facility] to arrange payment.    11am-[facility] able to accept patient today.  CSW met with patient who confirmed she will need [facility] for transport.  CSW left voicemail for spouse to make him aware, though patient stated she just let him know.    Expected Discharge Plan: Skilled Nursing Facility Barriers to Discharge: SNF Pending bed offer               Expected Discharge Plan and Services In-house Referral: Clinical Social Work   Post Acute Care Choice: Skilled Nursing Facility Living arrangements for the past 2 months: Single Family Home                                       Social Drivers of Health (SDOH) Interventions SDOH Screenings   Food Insecurity: No Food Insecurity (07/27/2024)  Housing: Low Risk  (07/27/2024)  Transportation Needs: No Transportation Needs (07/27/2024)  Utilities: Not At Risk (07/27/2024)  Depression (PHQ2-9): High Risk (07/27/2024)  Social Connections: Moderately Integrated (07/27/2024)  Tobacco Use: Low Risk  (07/27/2024)    Readmission Risk Interventions    03/28/2024   10:35 AM  Readmission Risk Prevention Plan  Transportation Screening Complete  PCP or Specialist Appt within 5-7 Days Complete  Home Care Screening Complete  Medication Review (RN CM) Complete

## 2024-08-22 DIAGNOSIS — K219 Gastro-esophageal reflux disease without esophagitis: Secondary | ICD-10-CM | POA: Diagnosis not present

## 2024-08-22 DIAGNOSIS — K589 Irritable bowel syndrome without diarrhea: Secondary | ICD-10-CM | POA: Diagnosis not present

## 2024-08-22 DIAGNOSIS — Z9181 History of falling: Secondary | ICD-10-CM | POA: Diagnosis not present

## 2024-08-22 DIAGNOSIS — M8580 Other specified disorders of bone density and structure, unspecified site: Secondary | ICD-10-CM | POA: Diagnosis not present

## 2024-08-22 DIAGNOSIS — M109 Gout, unspecified: Secondary | ICD-10-CM | POA: Diagnosis not present

## 2024-08-22 DIAGNOSIS — I10 Essential (primary) hypertension: Secondary | ICD-10-CM | POA: Diagnosis not present

## 2024-08-22 DIAGNOSIS — S82401D Unspecified fracture of shaft of right fibula, subsequent encounter for closed fracture with routine healing: Secondary | ICD-10-CM | POA: Diagnosis not present

## 2024-08-22 DIAGNOSIS — Z6829 Body mass index (BMI) 29.0-29.9, adult: Secondary | ICD-10-CM | POA: Diagnosis not present

## 2024-08-22 DIAGNOSIS — M19072 Primary osteoarthritis, left ankle and foot: Secondary | ICD-10-CM | POA: Diagnosis not present

## 2024-08-22 DIAGNOSIS — S82202D Unspecified fracture of shaft of left tibia, subsequent encounter for closed fracture with routine healing: Secondary | ICD-10-CM | POA: Diagnosis not present

## 2024-08-22 DIAGNOSIS — Z7401 Bed confinement status: Secondary | ICD-10-CM | POA: Diagnosis not present

## 2024-08-22 DIAGNOSIS — G8929 Other chronic pain: Secondary | ICD-10-CM | POA: Diagnosis not present

## 2024-08-30 ENCOUNTER — Ambulatory Visit: Admitting: Family Medicine

## 2024-10-02 ENCOUNTER — Telehealth: Payer: Self-pay

## 2024-10-02 NOTE — Telephone Encounter (Signed)
 Called patient Rachel Black to contact the office if pt calls please advised about the Flu vaccine

## 2024-10-04 NOTE — Telephone Encounter (Signed)
 Patients husband calling back advising that no, patient will not be getting the flu vaccine.
